# Patient Record
Sex: Male | Born: 1955
Health system: Southern US, Community
[De-identification: ages and names within clinical notes are randomized; demographics above are authoritative.]

## PROBLEM LIST (undated history)

## (undated) DIAGNOSIS — N183 Chronic kidney disease, stage 3 unspecified: Secondary | ICD-10-CM

## (undated) DIAGNOSIS — I10 Essential (primary) hypertension: Secondary | ICD-10-CM

## (undated) DIAGNOSIS — M869 Osteomyelitis, unspecified: Secondary | ICD-10-CM

## (undated) DIAGNOSIS — K759 Inflammatory liver disease, unspecified: Secondary | ICD-10-CM

## (undated) DIAGNOSIS — J45909 Unspecified asthma, uncomplicated: Secondary | ICD-10-CM

## (undated) DIAGNOSIS — C801 Malignant (primary) neoplasm, unspecified: Secondary | ICD-10-CM

## (undated) HISTORY — DX: Unspecified asthma, uncomplicated: J45.909

## (undated) HISTORY — DX: Malignant (primary) neoplasm, unspecified: C80.1

## (undated) HISTORY — PX: WISDOM TOOTH EXTRACTION: SHX21

## (undated) HISTORY — PX: COLONOSCOPY: SHX174

---

## 2011-10-23 ENCOUNTER — Other Ambulatory Visit: Payer: Self-pay | Admitting: Family Medicine

## 2011-10-23 DIAGNOSIS — I6529 Occlusion and stenosis of unspecified carotid artery: Secondary | ICD-10-CM

## 2011-10-28 ENCOUNTER — Other Ambulatory Visit: Payer: Self-pay

## 2011-10-29 ENCOUNTER — Ambulatory Visit
Admission: RE | Admit: 2011-10-29 | Discharge: 2011-10-29 | Disposition: A | Discharge status: Home / Self Care | Payer: 59 | Source: Ambulatory Visit | Attending: Family Medicine | Admitting: Family Medicine

## 2011-10-29 DIAGNOSIS — I6529 Occlusion and stenosis of unspecified carotid artery: Secondary | ICD-10-CM

## 2011-10-29 IMAGING — US US CAROTID DUPLEX BILAT
1 series · 13 of 24 positions shown · non-contrast
Comparison: None.

CLINICAL DATA: History of lightheadedness and dizziness.  Evaluate
for carotid artery stenosis.

BILATERAL CAROTID DUPLEX ULTRASOUND
TECHNIQUE: Gray scale imaging, color Doppler and duplex ultrasound
was performed of bilateral carotid and vertebral arteries in the
neck.

[Series 1: us carotid duplex bilat · 0.08mm/px · 13 of 49 slices shown]
[im 1/49]
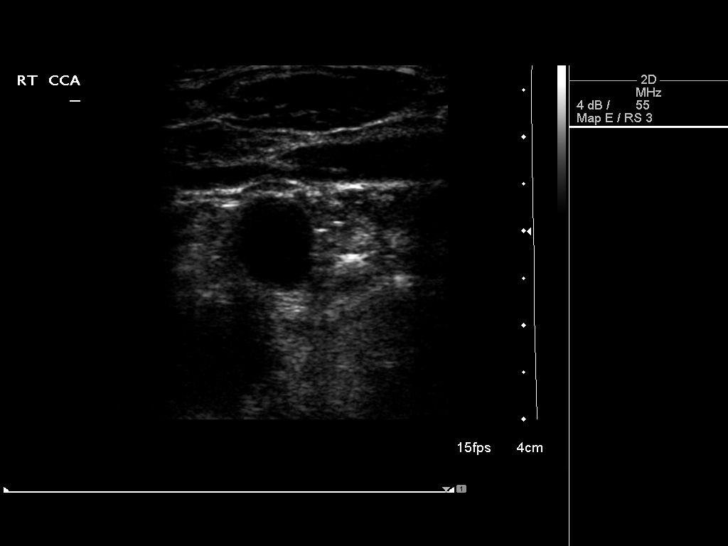
[im 5/49]
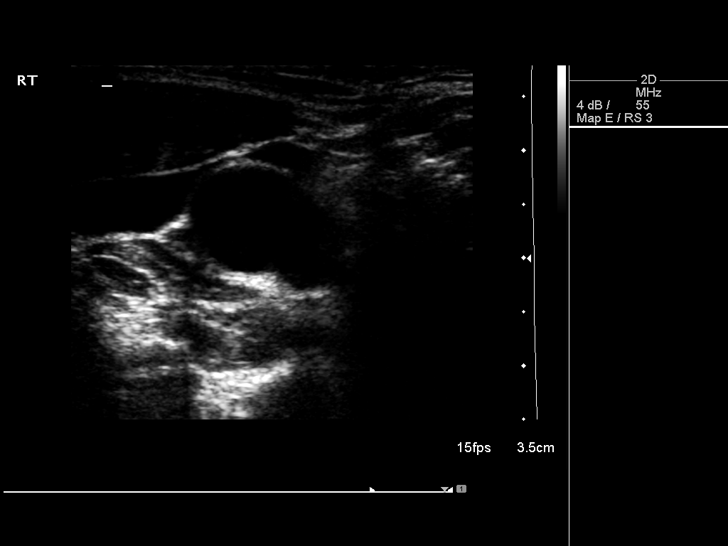
[im 9/49]
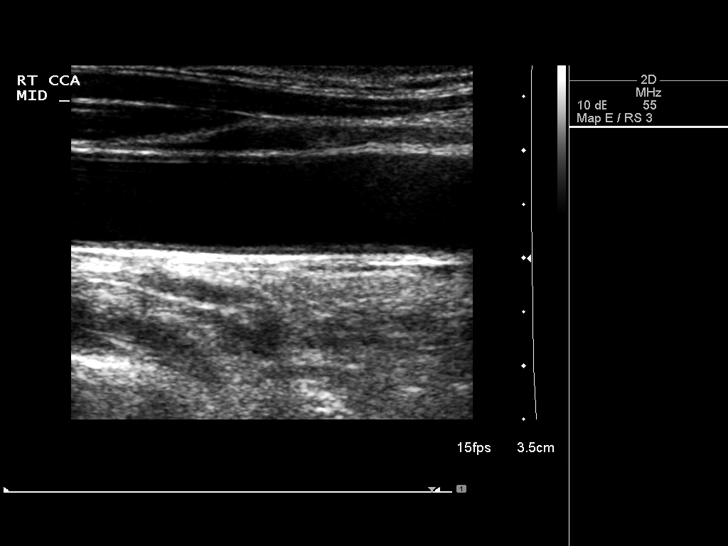
[im 13/49]
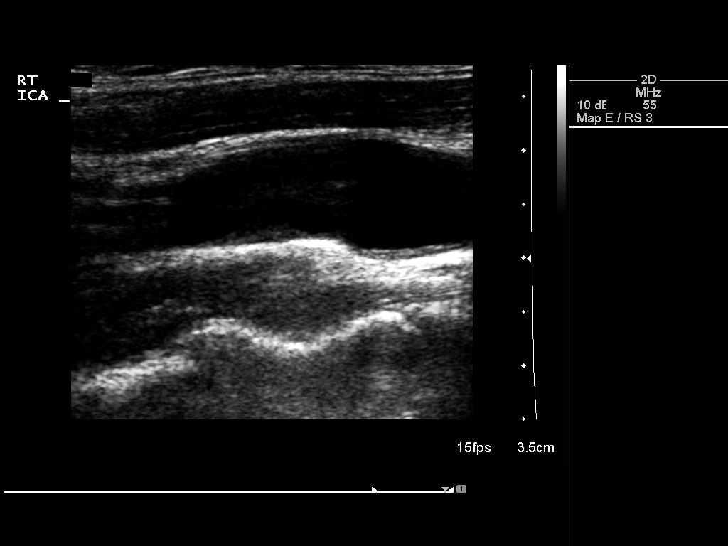
[im 17/49]
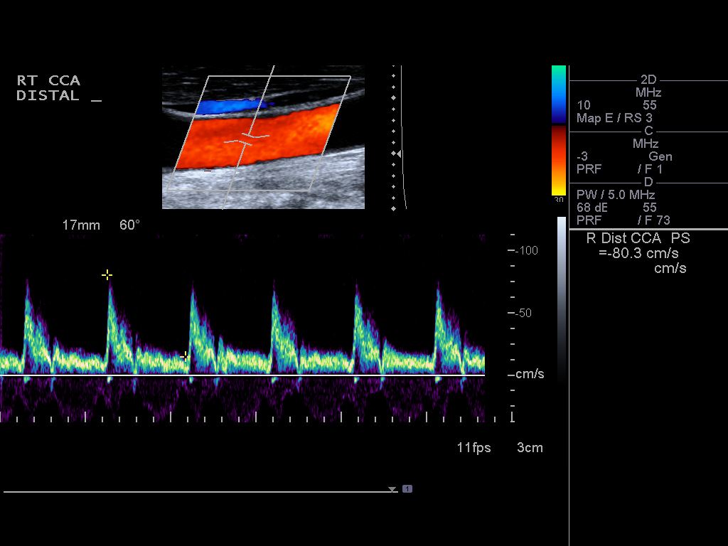
[im 21/49]
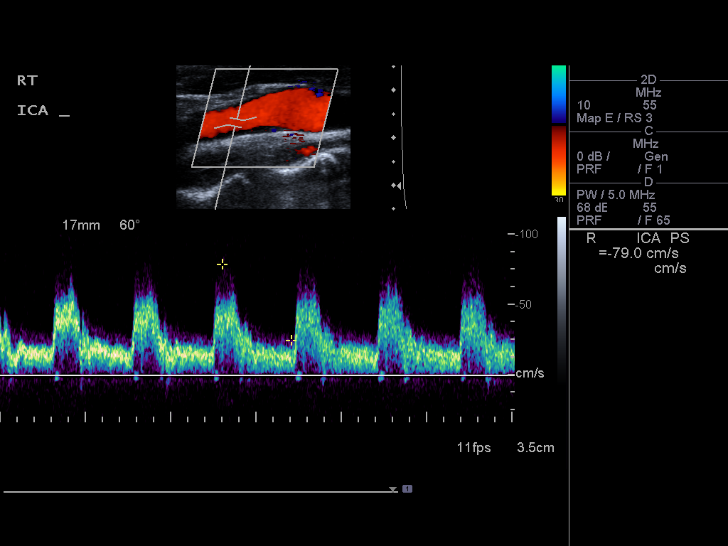
[im 26/49]
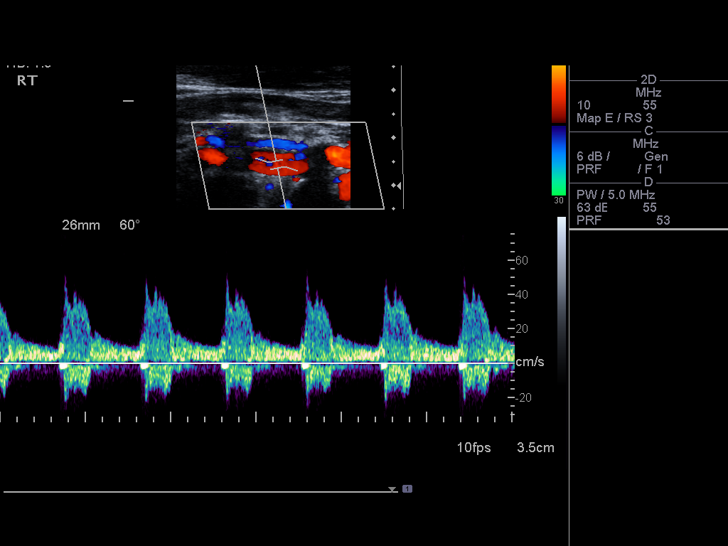
[im 28/49]
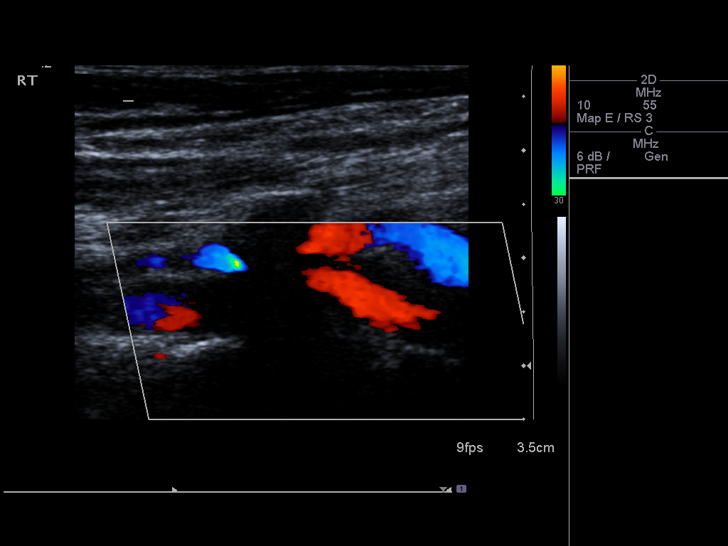
[im 32/49]
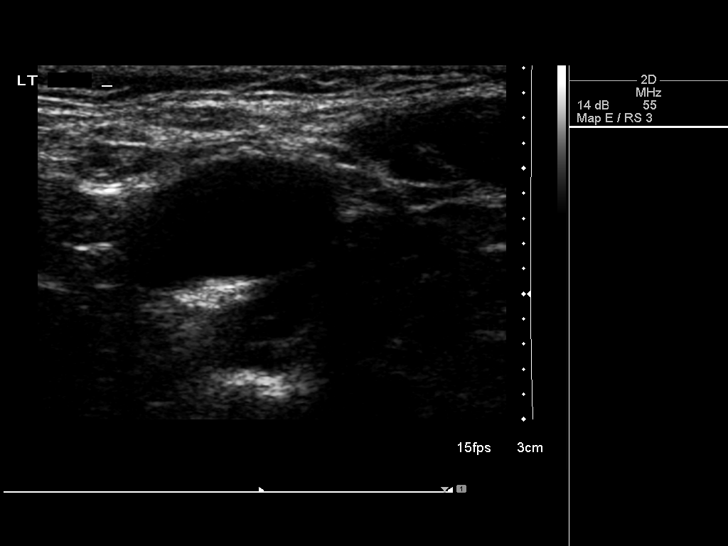
[im 36/49]
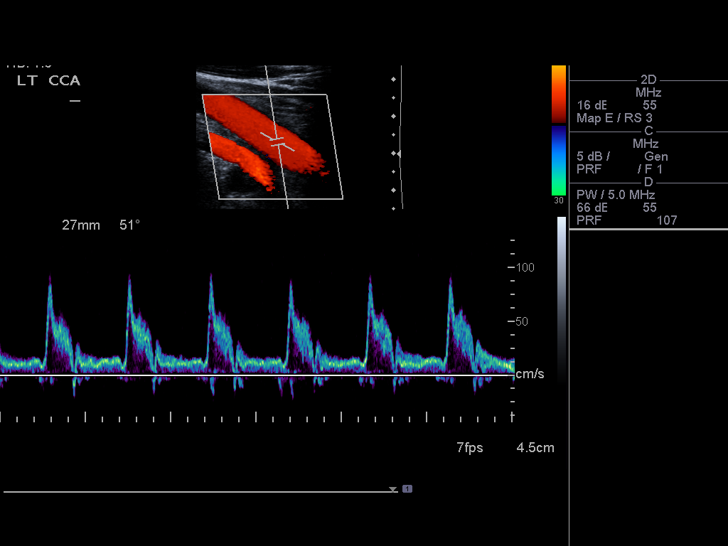
[im 40/49]
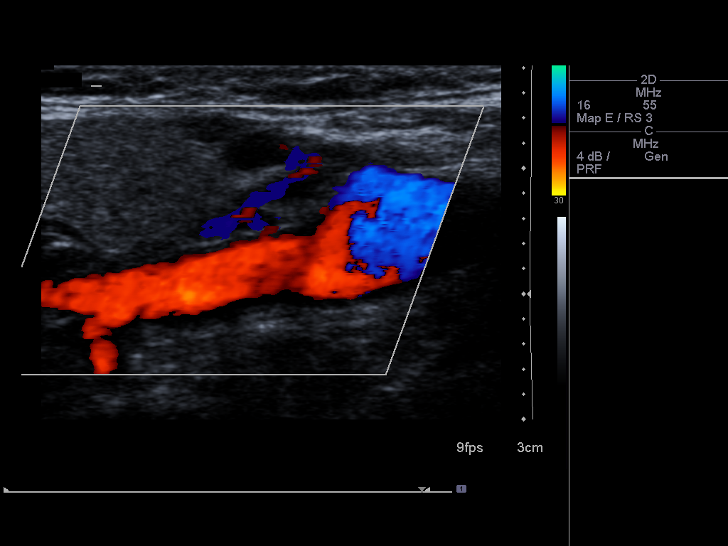
[im 44/49]
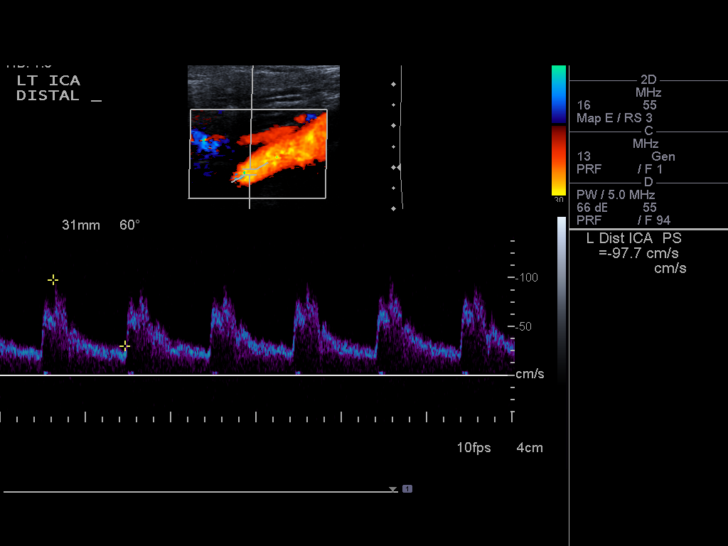
[im 49/49]
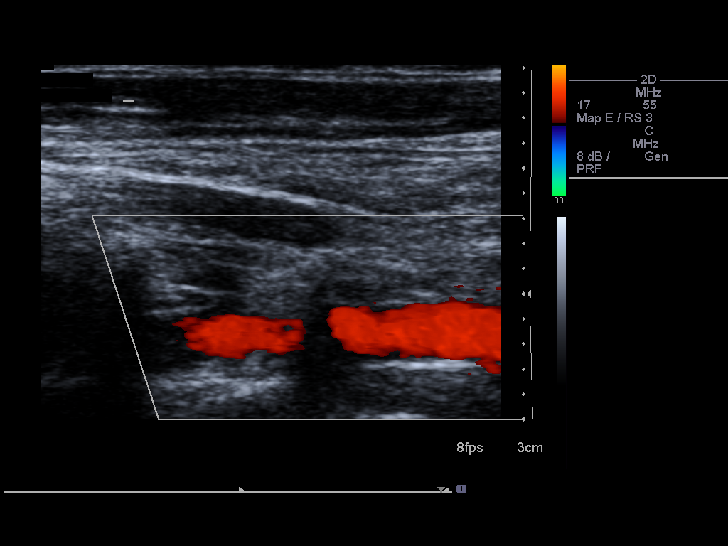

[13 of 24 positions shown; findings below may reference images not displayed]

Criteria:  Quantification of carotid stenosis is based on velocity
parameters that correlate the residual internal carotid diameter
with NASCET-based stenosis levels, using the diameter of the distal
internal carotid lumen as the denominator for stenosis measurement.

The following velocity measurements were obtained:

                 PEAK SYSTOLIC/END DIASTOLIC
RIGHT
ICA:                        110cm/sec
CCA:                        89cm/sec
SYSTOLIC ICA/CCA RATIO:
DIASTOLIC ICA/CCA RATIO:
ECA:                        86cm/sec

LEFT
ICA:                        112cm/sec
CCA:                        96cm/sec
SYSTOLIC ICA/CCA RATIO:
DIASTOLIC ICA/CCA RATIO:
ECA:                        97cm/sec
FINDINGS: RIGHT CAROTID ARTERY: There is a small amount of plaque in the
proximal right internal carotid artery.  Normal waveforms and
velocities throughout the right carotid arteries.  No evidence for
carotid artery stenosis.

RIGHT VERTEBRAL ARTERY:  Antegrade flow and normal waveform in the
right vertebral artery.

LEFT CAROTID ARTERY: Antegrade flow in the left carotid arteries
without significant plaque or stenosis.

LEFT VERTEBRAL ARTERY:  Antegrade flow and normal waveform in the
left vertebral artery.
IMPRESSION: Small amount of plaque in the right internal carotid artery.  No
evidence for hemodynamically significant stenosis.  Estimated
degree of stenosis in the internal carotid arteries is less than
50% bilaterally.

## 2012-02-24 ENCOUNTER — Ambulatory Visit (INDEPENDENT_AMBULATORY_CARE_PROVIDER_SITE_OTHER): Payer: 59 | Admitting: Family Medicine

## 2012-02-24 VITALS — BP 163/70 | HR 75 | Temp 98.2°F | Resp 16 | Ht 69.75 in | Wt 183.0 lb

## 2012-02-24 DIAGNOSIS — R1084 Generalized abdominal pain: Secondary | ICD-10-CM

## 2012-02-24 DIAGNOSIS — I10 Essential (primary) hypertension: Secondary | ICD-10-CM

## 2012-02-24 DIAGNOSIS — E119 Type 2 diabetes mellitus without complications: Secondary | ICD-10-CM

## 2012-02-24 DIAGNOSIS — H53129 Transient visual loss, unspecified eye: Secondary | ICD-10-CM

## 2012-02-24 DIAGNOSIS — R609 Edema, unspecified: Secondary | ICD-10-CM

## 2012-02-24 DIAGNOSIS — J45909 Unspecified asthma, uncomplicated: Secondary | ICD-10-CM | POA: Insufficient documentation

## 2012-02-24 LAB — POCT GLYCOSYLATED HEMOGLOBIN (HGB A1C): Hemoglobin A1C: 5.4

## 2012-02-24 MED ORDER — LOSARTAN POTASSIUM-HCTZ 50-12.5 MG PO TABS: 1.0000 | ORAL_TABLET | Freq: Every day | ORAL | Status: DC

## 2012-02-24 MED ORDER — METOPROLOL SUCCINATE ER 50 MG PO TB24: 50.0000 mg | ORAL_TABLET | Freq: Every day | ORAL | Status: DC

## 2012-02-24 MED ORDER — AUTO-LANCETS MISC: 100.0000 [IU] | Freq: Every morning | Status: DC

## 2012-02-24 MED ORDER — GLIPIZIDE-METFORMIN HCL 2.5-500 MG PO TABS: 1.0000 | ORAL_TABLET | Freq: Two times a day (BID) | ORAL | Status: DC

## 2012-02-24 NOTE — Progress Notes (Signed)
    Dr. 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 He's been noticing decreasing vision has been going to Dr. Herbert Deaner. He doesn't have a lot of confidence in his eye doctors have a mentor for Dr. Zadie Rhine before. He describes vision deterioration as loss of acuity. It involves both eyes. He's had past laser surgery. Her graft one month ago his father died of small bowel cancer having had 2 weeks of vomiting and then sepsis. He's concerned that he may have small bowel cancer as well although he said a colonoscopy which was negative. Family is concerned about swelling in his feet and rash which she describes as vesicular polypi waxy. Objective:  Objective: Alert no acute distress  HEENT unremarkable  Chest clear  Heart regular no murmur  Fundi: Post laser treatments no hemorrhages  Extremities: Good pulses 1-2+ edema, waxy surfaces on the  dorsal surface, a few abrasions on the anterior tibial surfaces.  Assessment: Diabetes control to be established  Visual problems need to call Dr. Herbert Deaner  Edema-we'll need to add diuretic for better blood pressure control as well as diuresis and edema control  Family history of bowel cancer-will need abdominal CT

## 2012-02-25 ENCOUNTER — Other Ambulatory Visit: Payer: Self-pay | Admitting: Family Medicine

## 2012-02-25 DIAGNOSIS — R7989 Other specified abnormal findings of blood chemistry: Secondary | ICD-10-CM

## 2012-02-25 LAB — COMPREHENSIVE METABOLIC PANEL
ALT: 17 U/L (ref 0–53)
AST: 20 U/L (ref 0–37)
Albumin: 4.1 g/dL (ref 3.5–5.2)
Alkaline Phosphatase: 74 U/L (ref 39–117)
BUN: 25 mg/dL — ABNORMAL HIGH (ref 6–23)
CO2: 25 mEq/L (ref 19–32)
Calcium: 9.2 mg/dL (ref 8.4–10.5)
Chloride: 108 mEq/L (ref 96–112)
Creat: 2.15 mg/dL — ABNORMAL HIGH (ref 0.50–1.35)
Glucose, Bld: 115 mg/dL — ABNORMAL HIGH (ref 70–99)
Potassium: 4.8 mEq/L (ref 3.5–5.3)
Sodium: 140 mEq/L (ref 135–145)
Total Bilirubin: 0.3 mg/dL (ref 0.3–1.2)
Total Protein: 6.9 g/dL (ref 6.0–8.3)

## 2012-02-25 LAB — TSH: TSH: 2.619 u[IU]/mL (ref 0.350–4.500)

## 2012-03-03 ENCOUNTER — Other Ambulatory Visit: Payer: Self-pay | Admitting: Family Medicine

## 2012-03-03 ENCOUNTER — Ambulatory Visit
Admission: RE | Admit: 2012-03-03 | Discharge: 2012-03-03 | Disposition: A | Discharge status: Home / Self Care | Payer: 59 | Source: Ambulatory Visit | Attending: Family Medicine | Admitting: Family Medicine

## 2012-03-03 IMAGING — CT CT ABD-PELV W/O CM
2 of 4 series · 17 of 46 positions shown, 19 images · IV contrast (READICAT/WATER &)
Comparison: None.

CLINICAL DATA: Abdominal pain, constipation.

CT ABDOMEN AND PELVIS WITHOUT CONTRAST
TECHNIQUE: Multidetector CT imaging of the abdomen and pelvis was
performed following the standard protocol without intravenous
contrast.

[Series 2: abd/pelvis without · axial · non-contrast · 0.70mm/px · z∈[-390,-5]mm · 14 of 85 slices shown, 16 images]
[im 4/85  soft-tissue]
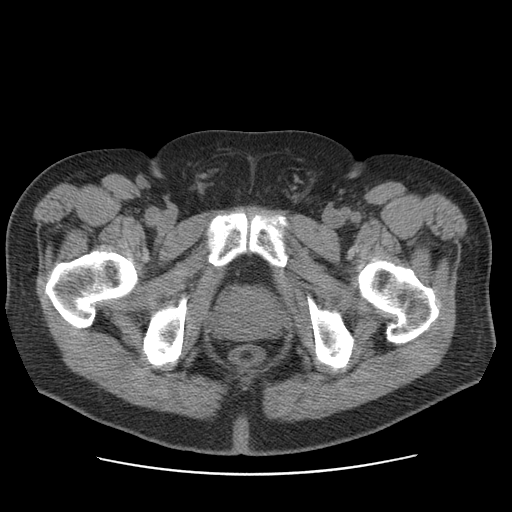
[im 4/85  bone]
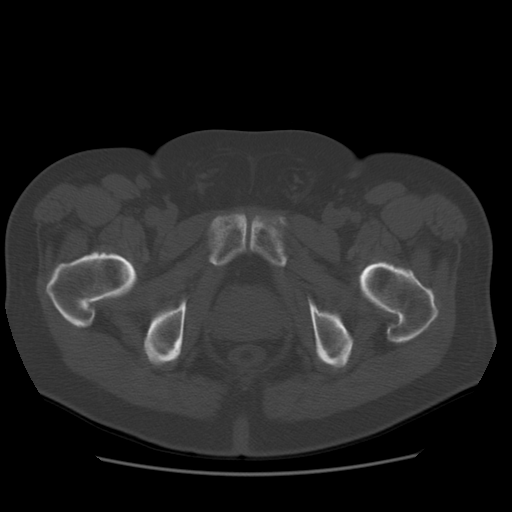
[im 11/85  soft-tissue]
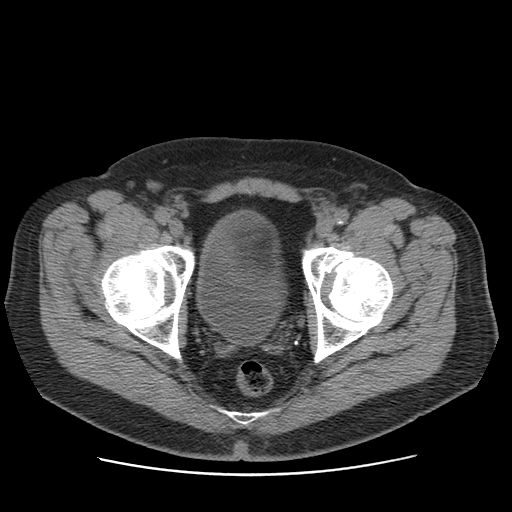
[im 17/85  soft-tissue]
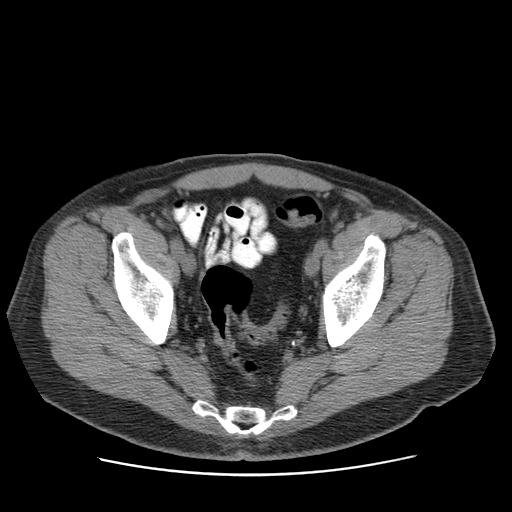
[im 24/85  soft-tissue]
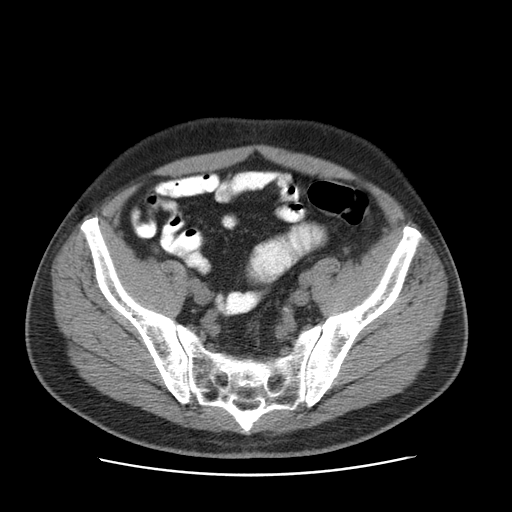
[im 27/85  soft-tissue]
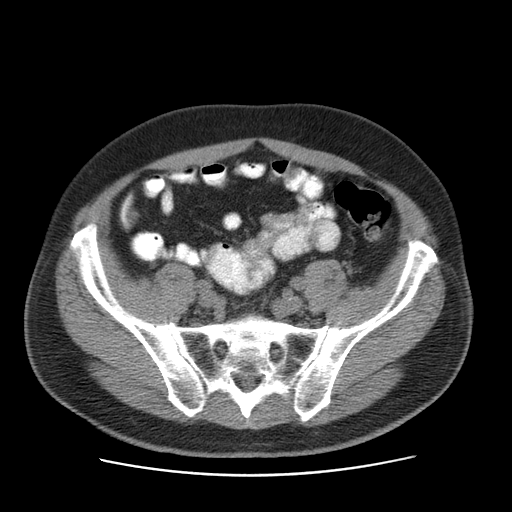
[im 34/85  soft-tissue]
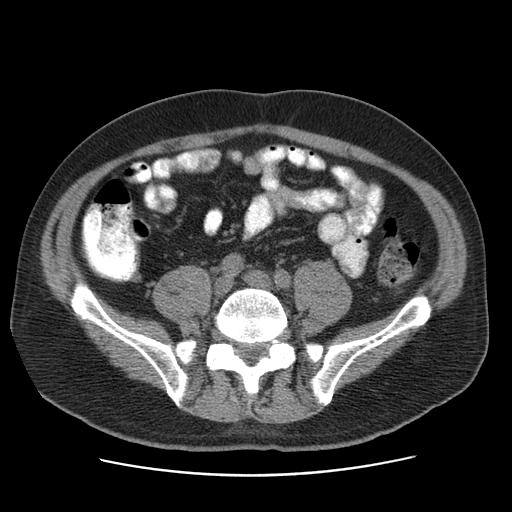
[im 41/85  soft-tissue]
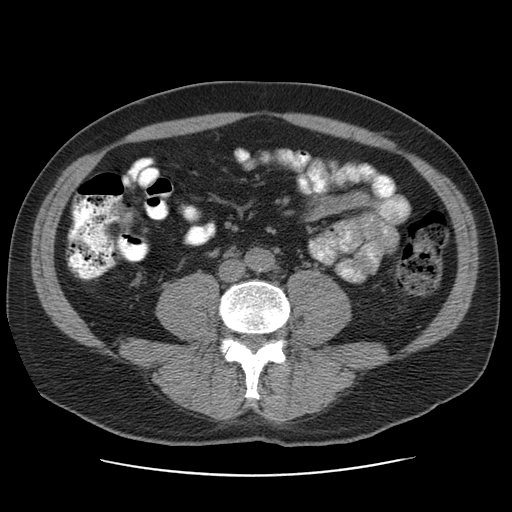
[im 44/85  soft-tissue]
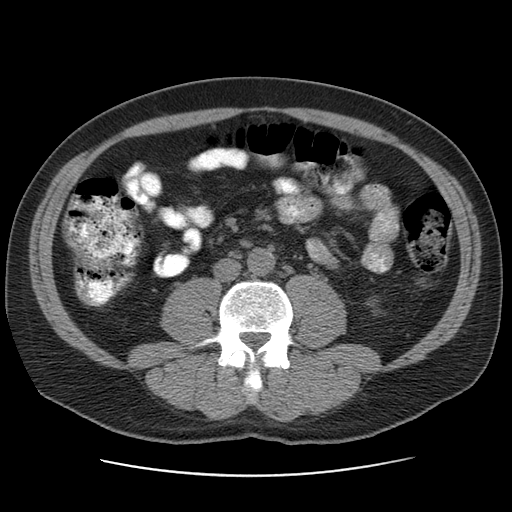
[im 51/85  soft-tissue]
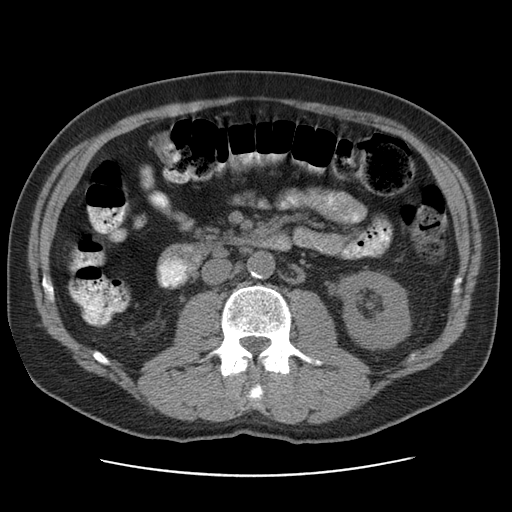
[im 51/85  bone]
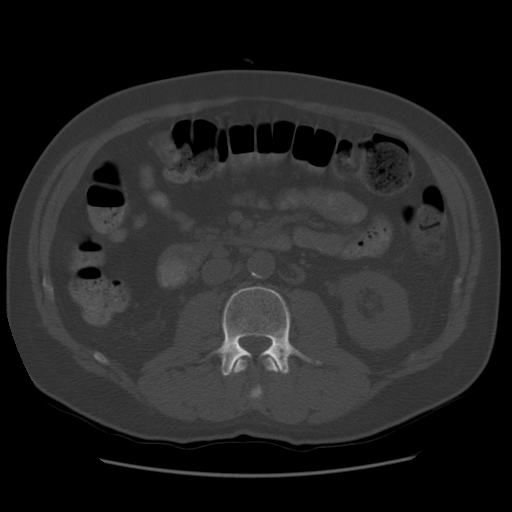
[im 58/85  soft-tissue]
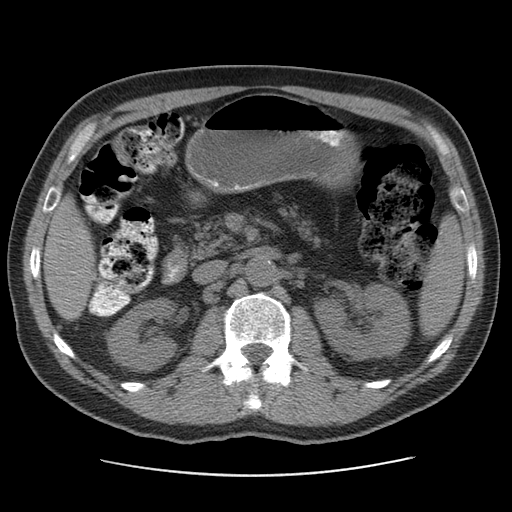
[im 64/85  soft-tissue]
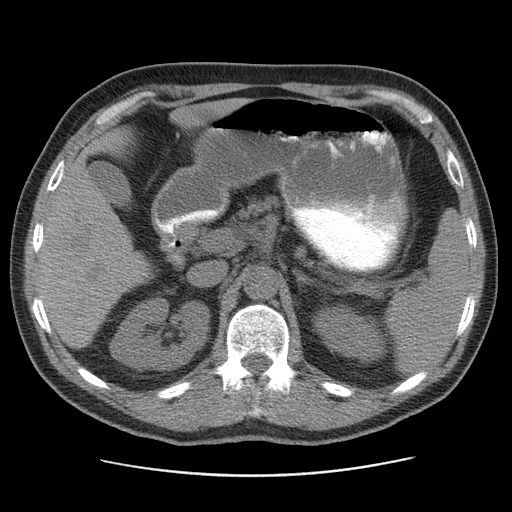
[im 68/85  soft-tissue]
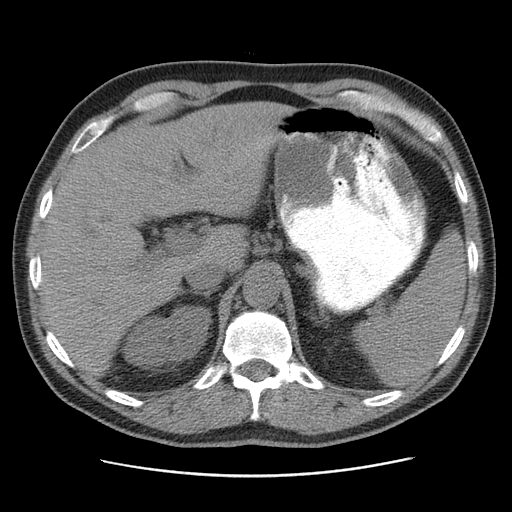
[im 74/85  soft-tissue]
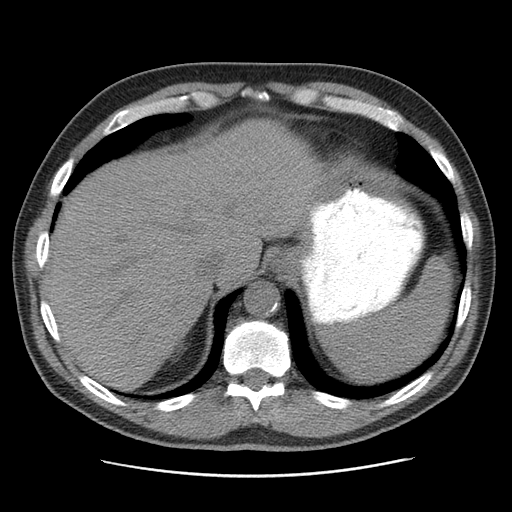
[im 81/85  soft-tissue]
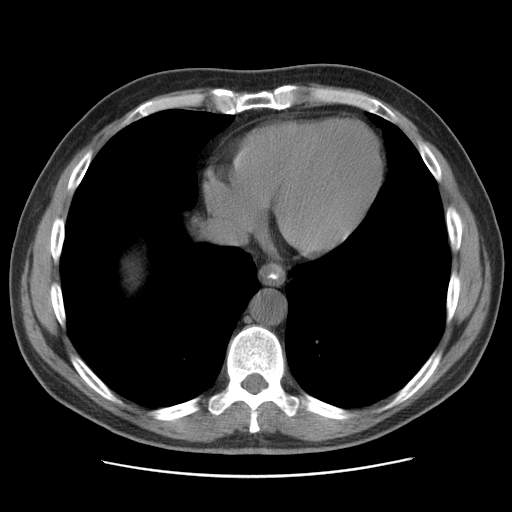

[Series 400: cor · coronal · 0.92mm/px · 3 of 120 slices shown]
[im 40/120  soft-tissue]
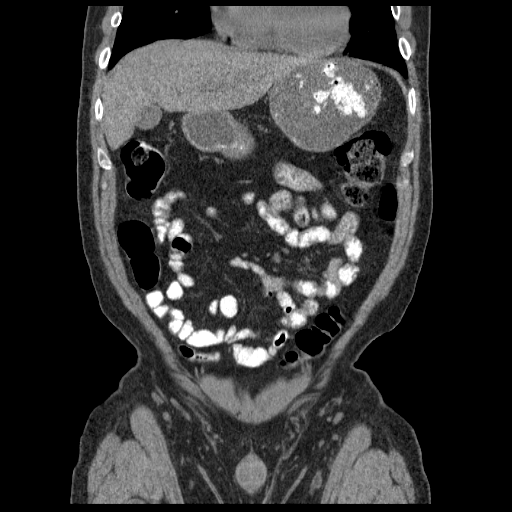
[im 53/120  soft-tissue]
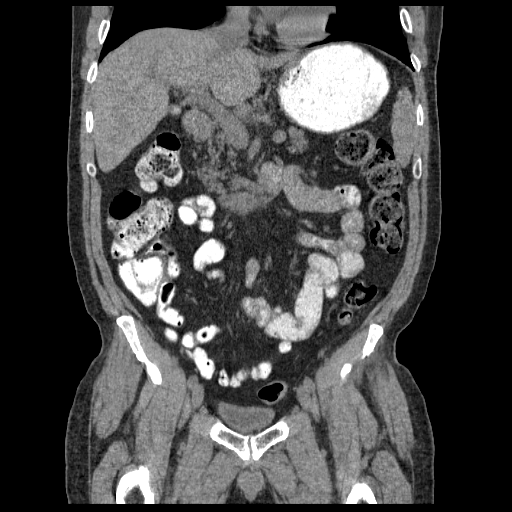
[im 67/120  soft-tissue]
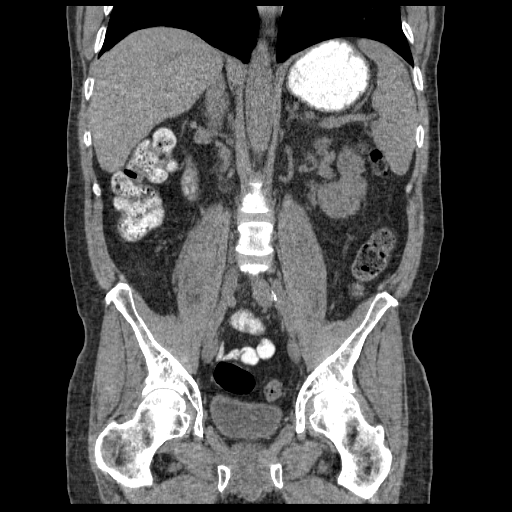

[17 of 46 positions shown; findings below may reference images not displayed]

FINDINGS: There is circumferential wall thickening within the
distal esophagus, question esophagitis.  Lung bases are clear.  No
effusions.  Heart is normal size.

Gallstones are noted within the gallbladder.  Liver, spleen,
pancreas, adrenals have an unremarkable unenhanced appearance.
Right kidney unremarkable.  Small low-density lesion in the mid
pole of the left kidney cannot be characterized without IV
contrast, measuring 16 mm.  This most likely represents a small
cyst.

Moderate stool burden throughout the colon.  Small bowel is
decompressed.  Appendix is visualized and is normal.

Shotty retroperitoneal lymph nodes, none pathologically enlarged.
No free fluid or free air.  Urinary bladder is unremarkable.

No acute bony abnormality.
IMPRESSION: No acute findings in the abdomen or pelvis.

Moderate stool burden throughout the colon.

## 2012-03-04 ENCOUNTER — Telehealth: Payer: Self-pay | Admitting: Radiology

## 2012-03-04 NOTE — Telephone Encounter (Signed)
Informed pt of normal ct scan.

## 2012-03-04 NOTE — Telephone Encounter (Signed)
Message copied by Ritta Slot on Wed Mar 04, 2012  3:26 PM ------      Message from: Kem Boroughs D      Created: Wed Mar 04, 2012  2:12 PM                   ----- Message -----         From: Robyn Haber, MD         Sent: 03/04/2012   1:17 PM           To: [facility]            Please inform patient of normal result.  Asymptomatic gallstones are present but there is no sign of any cancer!

## 2012-03-11 ENCOUNTER — Ambulatory Visit: Payer: 59

## 2012-03-11 ENCOUNTER — Ambulatory Visit (INDEPENDENT_AMBULATORY_CARE_PROVIDER_SITE_OTHER): Payer: 59 | Admitting: Family Medicine

## 2012-03-11 VITALS — BP 88/50 | HR 111 | Temp 99.4°F | Resp 16 | Ht 69.75 in | Wt 172.8 lb

## 2012-03-11 DIAGNOSIS — R059 Cough, unspecified: Secondary | ICD-10-CM

## 2012-03-11 DIAGNOSIS — R000 Tachycardia, unspecified: Secondary | ICD-10-CM

## 2012-03-11 DIAGNOSIS — R05 Cough: Secondary | ICD-10-CM

## 2012-03-11 DIAGNOSIS — R509 Fever, unspecified: Secondary | ICD-10-CM

## 2012-03-11 LAB — POCT CBC
Granulocyte percent: 58.4 %G (ref 37–80)
Hemoglobin: 10.2 g/dL — AB (ref 14.1–18.1)
MCH, POC: 28.3 pg (ref 27–31.2)
MCV: 88.4 fL (ref 80–97)
MPV: 7.6 fL (ref 0–99.8)
POC MID %: 13.6 %M — AB (ref 0–12)
Platelet Count, POC: 150 10*3/uL (ref 142–424)
RBC: 3.6 M/uL — AB (ref 4.69–6.13)
WBC: 4.7 10*3/uL (ref 4.6–10.2)

## 2012-03-11 LAB — POCT INFLUENZA A/B
Influenza A, POC: NEGATIVE
Influenza B, POC: NEGATIVE

## 2012-03-11 IMAGING — CR DG CHEST 2V
2 series · 2 of 2 positions shown · non-contrast
Comparison: None.

CLINICAL DATA: Cough, fever, sinus drainage.

CHEST - 2 VIEW

[PA]
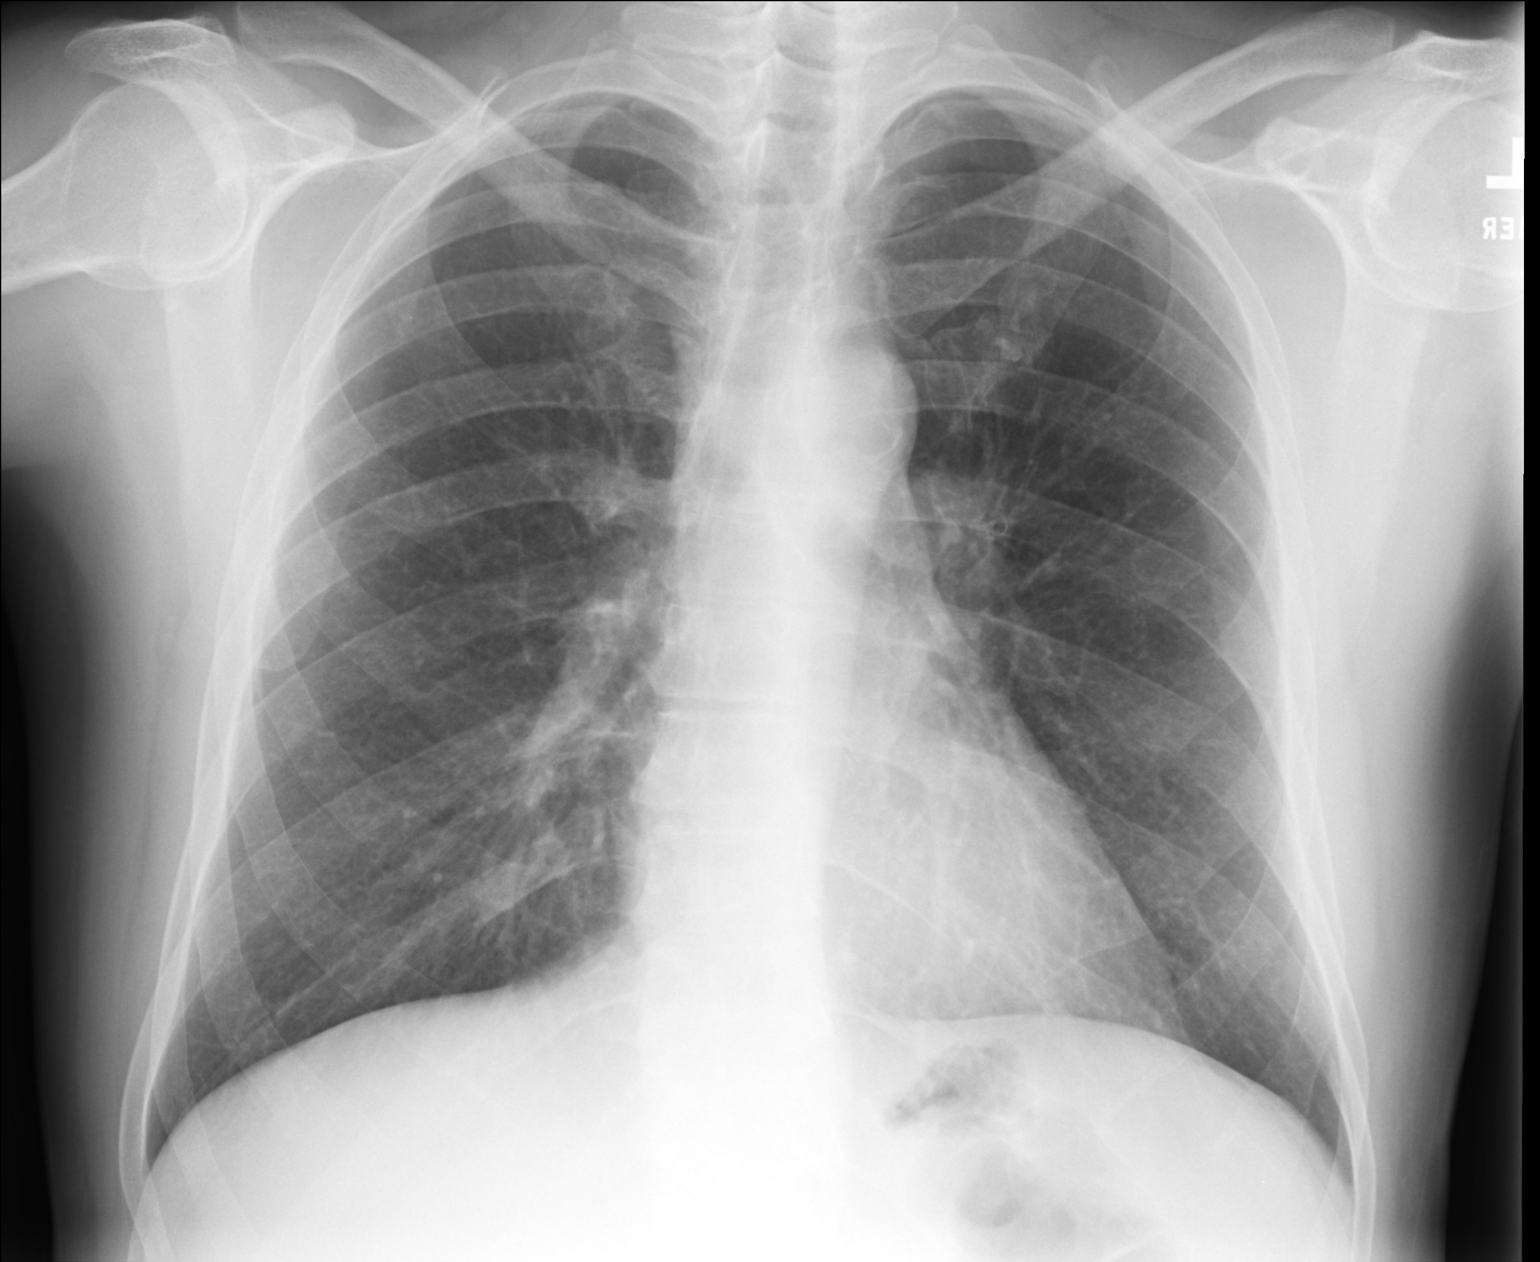

[lateral]
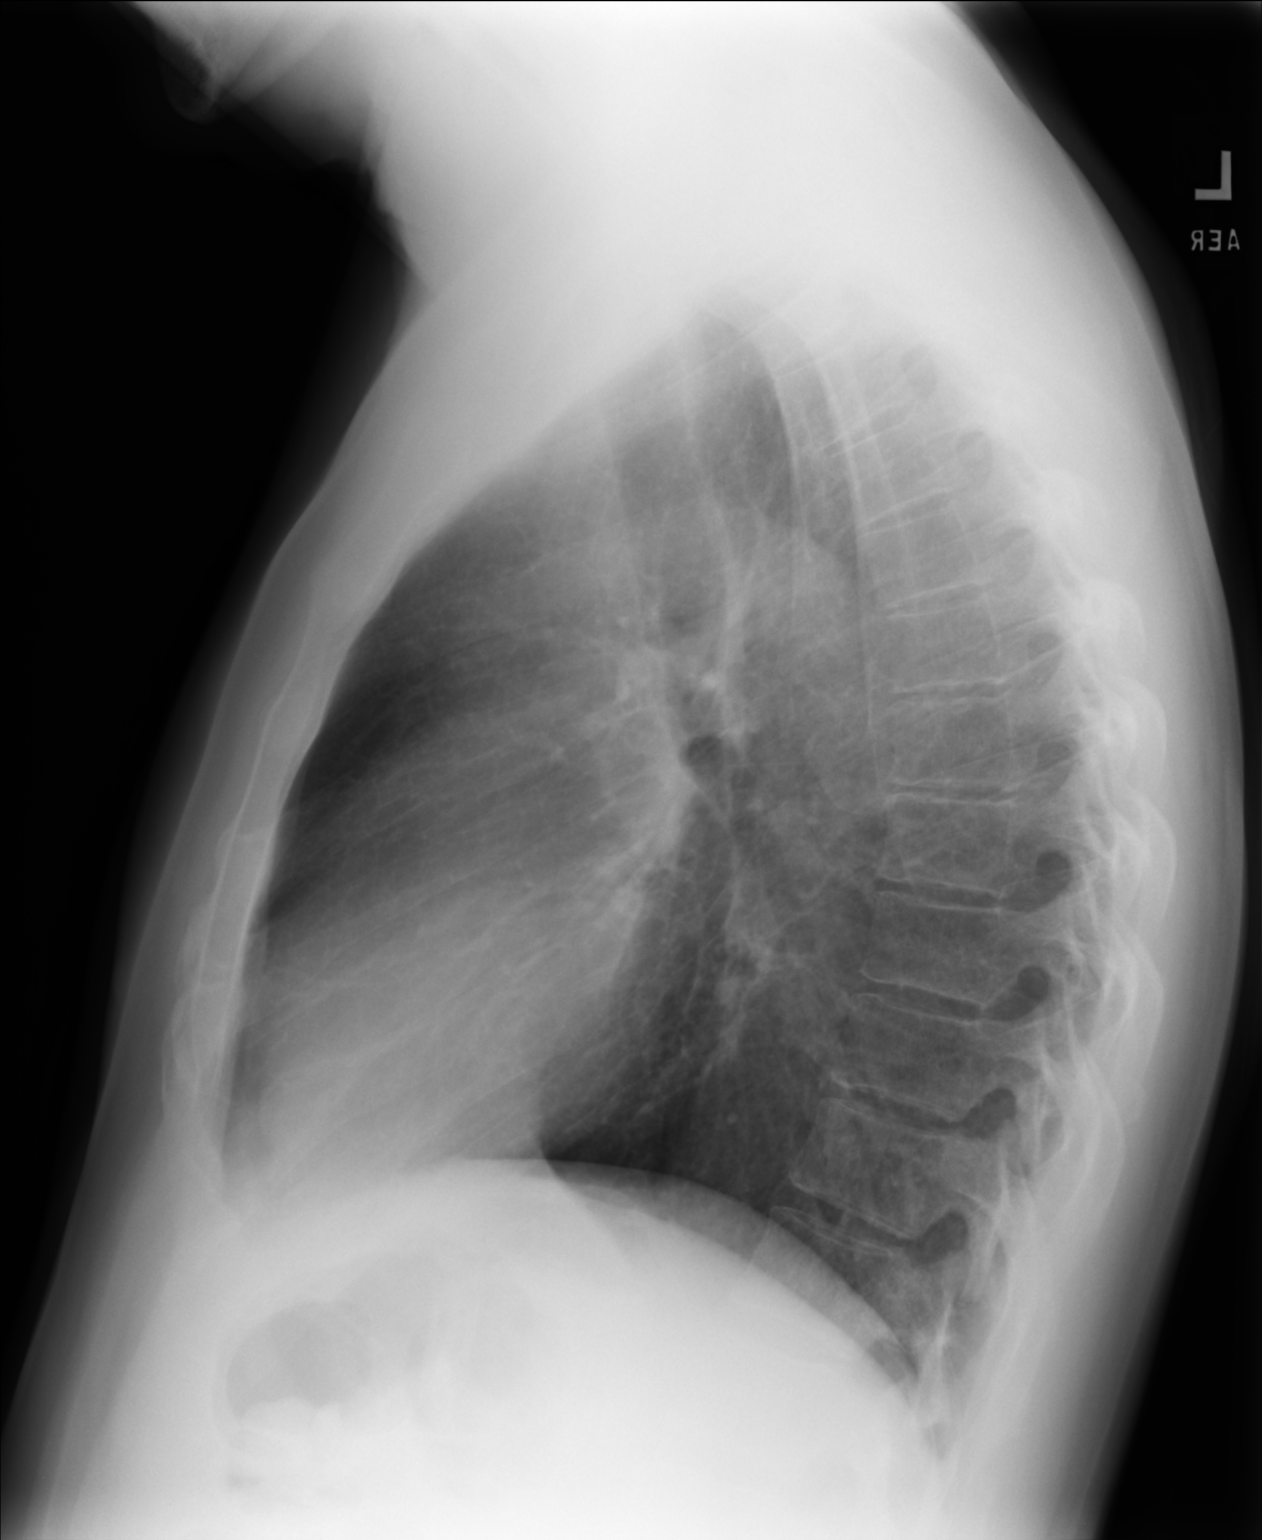

[2 of 2 positions shown; findings below may reference images not displayed]

FINDINGS: Heart and mediastinal contours are within normal limits.
No focal opacities or effusions.  No acute bony abnormality.
IMPRESSION: No active cardiopulmonary disease.

## 2012-03-11 MED ORDER — HYDROCODONE-HOMATROPINE 5-1.5 MG/5ML PO SYRP: 5.0000 mL | ORAL_SOLUTION | Freq: Three times a day (TID) | ORAL | Status: AC | PRN

## 2012-03-11 MED ORDER — AZITHROMYCIN 250 MG PO TABS: ORAL_TABLET | ORAL | Status: AC

## 2012-03-11 NOTE — Progress Notes (Signed)
Patient Name: Duane Hall Date of Birth: 12-18-1956 Medical Record Number: [id number] Gender: male Date of Encounter: 03/11/2012  History of Present Illness:  Duane Hall is a 56 y.o. very pleasant male patient who presents with the following:  Last week he noted a severe cough, ST, occasionally coughing up phlegm. Had some blood from his nose this morning- not a nose bleed but bloody mucus. " I have sinus problems year round."  Feels fatigued, achy, low grade fevers.  Cough is painful when it is severe.  No sick contacts.   Denies any melena or BRBPR, no epistaxis or other bleeding or bruising.  He has a history of DM with good control, and HTN- BP is usually a little bit elevated.   He has an apparently long history of anemia- so far work-up has been unfruitful.  He did have a colonoscopy about 6 months ago with several polyps sampled- however all were benign.  He states that his father also had anemia without any known cause.   Patient Active Problem List  Diagnoses  . Type II diabetes mellitus  . Hypertension  . Asthma   Past Medical History  Diagnosis Date  . Cataract   . Glaucoma   . Diabetes mellitus    No past surgical history on file. History  Substance Use Topics  . Smoking status: Current Everyday Smoker -- 0.8 packs/day    Types: Cigarettes  . Smokeless tobacco: Not on file  . Alcohol Use: Not on file   Family History  Problem Relation Age of Onset  . Cancer Father    No Known Allergies  Medication list has been reviewed and updated.  Review of Systems: As per HPI- otherwise negative.   Physical Examination: Filed Vitals:   03/11/12 0917  BP: 88/50  Pulse: 111  Temp: 99.4 F (37.4 C)  TempSrc: Oral  Resp: 16  Height: 5' 9.75" (1.772 m)  Weight: 172 lb 12.8 oz (78.382 kg)  Bp recheck 108/ 88 Recheck again 115/70, pulse 100  Body mass index is 24.97 kg/(m^2).  GEN: WDWN, NAD, Non-toxic, A & O x 3, very pale HEENT: Atraumatic,  Normocephalic. Neck supple. No masses, No LAD. TM and oropharynx clear Ears and Nose: No external deformity. CV: tachycardic, No M/G/R. No JVD. No thrill. No extra heart sounds. PULM: CTA B, no wheezes, crackles, rhonchi. No retractions. No resp. distress. No accessory muscle use. ABD: S, NT, ND, +BS. No rebound. No HSM. EXTR: No c/c/e NEURO Normal gait.  PSYCH: Normally interactive. Conversant. Not depressed or anxious appearing.  Calm demeanor.   Office Visit on 02/24/2012  Component Date Value Range Status  . Sodium (mEq/L) 02/24/2012 140  135-145 Final  . Potassium (mEq/L) 02/24/2012 4.8  3.5-5.3 Final  . Chloride (mEq/L) 02/24/2012 108  96-112 Final  . CO2 (mEq/L) 02/24/2012 25  19-32 Final  . Glucose, Bld (mg/dL) 02/24/2012 115* 70-99 Final  . BUN (mg/dL) 02/24/2012 25* 6-23 Final  . Creat (mg/dL) 02/24/2012 2.15* 0.50-1.35 Final  . Total Bilirubin (mg/dL) 02/24/2012 0.3  0.3-1.2 Final  . Alkaline Phosphatase (U/L) 02/24/2012 74  39-117 Final  . AST (U/L) 02/24/2012 20  0-37 Final  . ALT (U/L) 02/24/2012 17  0-53 Final  . Total Protein (g/dL) 02/24/2012 6.9  6.0-8.3 Final  . Albumin (g/dL) 02/24/2012 4.1  3.5-5.2 Final  . Calcium (mg/dL) 02/24/2012 9.2  8.4-10.5 Final  . Hemoglobin A1C  02/24/2012 5.4   Final  . TSH (uIU/mL) 02/24/2012 2.619  0.350-4.500 Final  A1c 10/2011 = 6.5%  Results for orders placed in visit on 03/11/12  POCT CBC      Component Value Range   WBC 4.7  4.6 - 10.2 (K/uL)   Lymph, poc 1.3  0.6 - 3.4    POC LYMPH PERCENT 28.0  10 - 50 (%L)   MID (cbc) 0.6  0 - 0.9    POC MID % 13.6 (*) 0 - 12 (%M)   POC Granulocyte 2.7  2 - 6.9    Granulocyte percent 58.4  37 - 80 (%G)   RBC 3.60 (*) 4.69 - 6.13 (M/uL)   Hemoglobin 10.2 (*) 14.1 - 18.1 (g/dL)   HCT, POC 31.8 (*) 43.5 - 53.7 (%)   MCV 88.4  80 - 97 (fL)   MCH, POC 28.3  27 - 31.2 (pg)   MCHC 32.1  31.8 - 35.4 (g/dL)   RDW, POC 13.6     Platelet Count, POC 150  142 - 424 (K/uL)   MPV 7.6  0 -  99.8 (fL)  GLUCOSE, POCT (MANUAL RESULT ENTRY)      Component Value Range   POC Glucose 132    POCT INFLUENZA A/B      Component Value Range   Influenza A, POC Negative     Influenza B, POC Negative      [facility] reading (PRIMARY) by  Dr. Lorelei Pont.  Negative chest  Clinical Data: Cough, fever, sinus drainage.  CHEST - 2 VIEW  Comparison: None.  Findings: Heart and mediastinal contours are within normal limits. No focal opacities or effusions. No acute bony abnormality.  IMPRESSION: No active cardiopulmonary disease.  Assessment and Plan: 1. Tachycardia  POCT CBC, POCT glucose (manual entry)  2. Cough  POCT Influenza A/B, DG Chest 2 View, azithromycin (ZITHROMAX) 250 MG tablet, HYDROcodone-homatropine (HYCODAN) 5-1.5 MG/5ML syrup  3. Fever  POCT Influenza A/B, DG Chest 2 View    [name] has a significant anemia today, but looking back in his paper chart his hemoglobin has been 10 for the last several months.  Will treat for bronchitis with azithromycin as above.  He agreed to come back for follow- up if he is not significantly better in 1-2 days- Sooner if worse.   I did encourage him to continue to follow his anemia, but he feels that this is probably just a familial trait.  He has already had a normal Hg electrophoreses, normal ferritin and negative colonoscopy.

## 2012-06-21 ENCOUNTER — Inpatient Hospital Stay: Admission: AD | Admit: 2012-06-21 | Payer: 59 | Source: Ambulatory Visit | Admitting: Cardiovascular Disease

## 2012-06-22 ENCOUNTER — Ambulatory Visit (HOSPITAL_COMMUNITY): Admission: RE | Admit: 2012-06-22 | Payer: 59 | Source: Ambulatory Visit | Admitting: Cardiovascular Disease

## 2012-06-22 ENCOUNTER — Encounter (HOSPITAL_COMMUNITY): Admission: RE | Payer: Self-pay | Source: Ambulatory Visit

## 2012-06-22 SURGERY — ANGIOGRAM, LOWER EXTREMITY
Anesthesia: LOCAL

## 2012-11-17 ENCOUNTER — Encounter: Payer: Self-pay | Admitting: Family Medicine

## 2012-11-17 ENCOUNTER — Ambulatory Visit (INDEPENDENT_AMBULATORY_CARE_PROVIDER_SITE_OTHER): Payer: Managed Care, Other (non HMO) | Admitting: Family Medicine

## 2012-11-17 VITALS — BP 118/70 | HR 79 | Temp 98.8°F | Resp 16 | Ht 69.0 in | Wt 174.0 lb

## 2012-11-17 DIAGNOSIS — I10 Essential (primary) hypertension: Secondary | ICD-10-CM

## 2012-11-17 DIAGNOSIS — I739 Peripheral vascular disease, unspecified: Secondary | ICD-10-CM

## 2012-11-17 DIAGNOSIS — E119 Type 2 diabetes mellitus without complications: Secondary | ICD-10-CM

## 2012-11-17 DIAGNOSIS — M25519 Pain in unspecified shoulder: Secondary | ICD-10-CM

## 2012-11-17 MED ORDER — GLIPIZIDE ER 5 MG PO TB24: 5.0000 mg | ORAL_TABLET | Freq: Every day | ORAL | Status: DC

## 2012-11-17 MED ORDER — LOSARTAN POTASSIUM 50 MG PO TABS: 50.0000 mg | ORAL_TABLET | Freq: Every day | ORAL | Status: DC

## 2012-11-17 MED ORDER — METOPROLOL SUCCINATE ER 50 MG PO TB24: 50.0000 mg | ORAL_TABLET | Freq: Every day | ORAL | Status: DC

## 2012-11-17 MED ORDER — HYDROCHLOROTHIAZIDE 12.5 MG PO CAPS: 12.5000 mg | ORAL_CAPSULE | Freq: Every day | ORAL | Status: DC

## 2012-11-17 MED ORDER — METFORMIN HCL 500 MG PO TABS: 500.0000 mg | ORAL_TABLET | Freq: Two times a day (BID) | ORAL | Status: DC

## 2012-11-17 NOTE — Progress Notes (Signed)
56 yo man with CAD who has been told that he needs a coronary angiogram, but if he has it, he may go into renal failure.  He had a nucleide study and echo by Dr. Gwenlyn Found at [facility].  Patient notes leg aches when walking and has mild DOE on similar movement.  He continues to smoke 1/2 pack per day.  The electronic cigarettes do not work for him and he doesn't want to try chantix.  Refuses immunizations saying he doesn't believe in them  Complains of axillary body odor and wonders what is causing this:  Worse when using heating blanket or exercising.  He also complains today of migratory joint aches, with pain in the right shoulder today.  This has limited his activities more than the above symptoms.  He also complains about the price of his medications  Objective:  NAD Skin:  Pickers nodules right dorsal hand Chest:  Clear Heart:  Regular, no murmur Ext:  No edema He has inability to fully abduct right shoulder, but there is no swelling or tenderness in the shoulder  Assessment: 1. Shoulder pain  Sedimentation rate, Hemoglobin A1c, Comprehensive metabolic panel  2. Claudication  Sedimentation rate, Hemoglobin A1c, Comprehensive metabolic panel  3. Diabetes  glipiZIDE (GLUCOTROL XL) 5 MG 24 hr tablet, metFORMIN (GLUCOPHAGE) 500 MG tablet  4. Hypertension  metoprolol succinate (TOPROL-XL) 50 MG 24 hr tablet, losartan (COZAAR) 50 MG tablet, hydrochlorothiazide (MICROZIDE) 12.5 MG capsule   I gave patient prednisone 20  3-2-2-1-1  For the shoulder pain. I am referring patient to Dr Einar Gip for second opinion on the CAD

## 2012-11-18 LAB — COMPREHENSIVE METABOLIC PANEL
ALT: 23 U/L (ref 0–53)
AST: 22 U/L (ref 0–37)
Albumin: 4.4 g/dL (ref 3.5–5.2)
Alkaline Phosphatase: 69 U/L (ref 39–117)
BUN: 26 mg/dL — ABNORMAL HIGH (ref 6–23)
CO2: 27 mEq/L (ref 19–32)
Calcium: 9.5 mg/dL (ref 8.4–10.5)
Chloride: 105 mEq/L (ref 96–112)
Creat: 2.09 mg/dL — ABNORMAL HIGH (ref 0.50–1.35)
Glucose, Bld: 106 mg/dL — ABNORMAL HIGH (ref 70–99)
Potassium: 4.8 mEq/L (ref 3.5–5.3)
Sodium: 136 mEq/L (ref 135–145)
Total Bilirubin: 0.3 mg/dL (ref 0.3–1.2)
Total Protein: 7.1 g/dL (ref 6.0–8.3)

## 2012-11-18 LAB — HEMOGLOBIN A1C
Hgb A1c MFr Bld: 6.3 % — ABNORMAL HIGH (ref <5.7)
Mean Plasma Glucose: 134 mg/dL — ABNORMAL HIGH (ref <117)

## 2012-11-18 LAB — SEDIMENTATION RATE: Sed Rate: 23 mm/hr — ABNORMAL HIGH (ref 0–16)

## 2012-12-07 ENCOUNTER — Other Ambulatory Visit: Payer: Self-pay | Admitting: Cardiology

## 2012-12-07 DIAGNOSIS — I739 Peripheral vascular disease, unspecified: Secondary | ICD-10-CM

## 2012-12-08 ENCOUNTER — Ambulatory Visit (INDEPENDENT_AMBULATORY_CARE_PROVIDER_SITE_OTHER): Payer: Managed Care, Other (non HMO) | Admitting: Physician Assistant

## 2012-12-08 VITALS — BP 128/70 | HR 68 | Temp 98.5°F | Resp 16 | Ht 69.0 in | Wt 174.0 lb

## 2012-12-08 DIAGNOSIS — E785 Hyperlipidemia, unspecified: Secondary | ICD-10-CM

## 2012-12-08 LAB — LIPID PANEL
Cholesterol: 156 mg/dL (ref 0–200)
HDL: 28 mg/dL — ABNORMAL LOW
LDL Cholesterol: 102 mg/dL — ABNORMAL HIGH (ref 0–99)
Total CHOL/HDL Ratio: 5.6 ratio
Triglycerides: 131 mg/dL
VLDL: 26 mg/dL (ref 0–40)

## 2012-12-08 NOTE — Progress Notes (Signed)
  Subjective:    Patient ID: AUNDRAE BATOR, male    DOB: [birth year], 56 y.o.   MRN: [id number]  HPI 56 year old male presents for lab draw per orders from Dr. Einar Gip. He is doing well without complaint.   PMHx: DM, HTN, hyperlipidemia    Review of Systems  HENT: Negative.   Respiratory: Negative.   Cardiovascular: Negative.   Neurological: Negative.   All other systems reviewed and are negative.       Objective:   Physical Exam  Constitutional: He is oriented to person, place, and time. He appears well-developed and well-nourished.  HENT:  Head: Normocephalic and atraumatic.  Right Ear: External ear normal.  Left Ear: External ear normal.  Eyes: Conjunctivae normal are normal.  Neck: Normal range of motion.  Cardiovascular: Normal rate.   Pulmonary/Chest: Effort normal.  Neurological: He is alert and oriented to person, place, and time.  Psychiatric: He has a normal mood and affect. His behavior is normal. Judgment and thought content normal.          Assessment & Plan:   1. Hyperlipidemia  Lipid panel   Labs drawn. Will fax to Dr. Einar Gip Follow up as planned with Dr. Joseph Art

## 2013-02-09 ENCOUNTER — Encounter: Payer: Self-pay | Admitting: Family Medicine

## 2013-02-09 ENCOUNTER — Ambulatory Visit (INDEPENDENT_AMBULATORY_CARE_PROVIDER_SITE_OTHER): Payer: Managed Care, Other (non HMO) | Admitting: Family Medicine

## 2013-02-09 ENCOUNTER — Ambulatory Visit: Payer: Managed Care, Other (non HMO)

## 2013-02-09 VITALS — BP 97/65 | HR 89 | Temp 97.6°F | Resp 16 | Ht 70.5 in | Wt 169.0 lb

## 2013-02-09 DIAGNOSIS — M25559 Pain in unspecified hip: Secondary | ICD-10-CM

## 2013-02-09 DIAGNOSIS — M25519 Pain in unspecified shoulder: Secondary | ICD-10-CM

## 2013-02-09 DIAGNOSIS — D649 Anemia, unspecified: Secondary | ICD-10-CM

## 2013-02-09 DIAGNOSIS — IMO0001 Reserved for inherently not codable concepts without codable children: Secondary | ICD-10-CM

## 2013-02-09 LAB — CBC WITH DIFFERENTIAL/PLATELET
Basophils Relative: 0 % (ref 0–1)
Hemoglobin: 10.1 g/dL — ABNORMAL LOW (ref 13.0–17.0)
Lymphocytes Relative: 34 % (ref 12–46)
Lymphs Abs: 2.4 10*3/uL (ref 0.7–4.0)
MCHC: 33.8 g/dL (ref 30.0–36.0)
Monocytes Relative: 7 % (ref 3–12)
Neutro Abs: 4 10*3/uL (ref 1.7–7.7)
Neutrophils Relative %: 57 % (ref 43–77)
RBC: 3.33 MIL/uL — ABNORMAL LOW (ref 4.22–5.81)
WBC: 7.1 10*3/uL (ref 4.0–10.5)

## 2013-02-09 IMAGING — CR DG SHOULDER 2+V*L*
2 series · 2 of 2 positions shown · non-contrast
Comparison: Right shoulder.

CLINICAL DATA: History of shoulder pain.

LEFT SHOULDER - 2+ VIEW

[AP (1 of 2)]
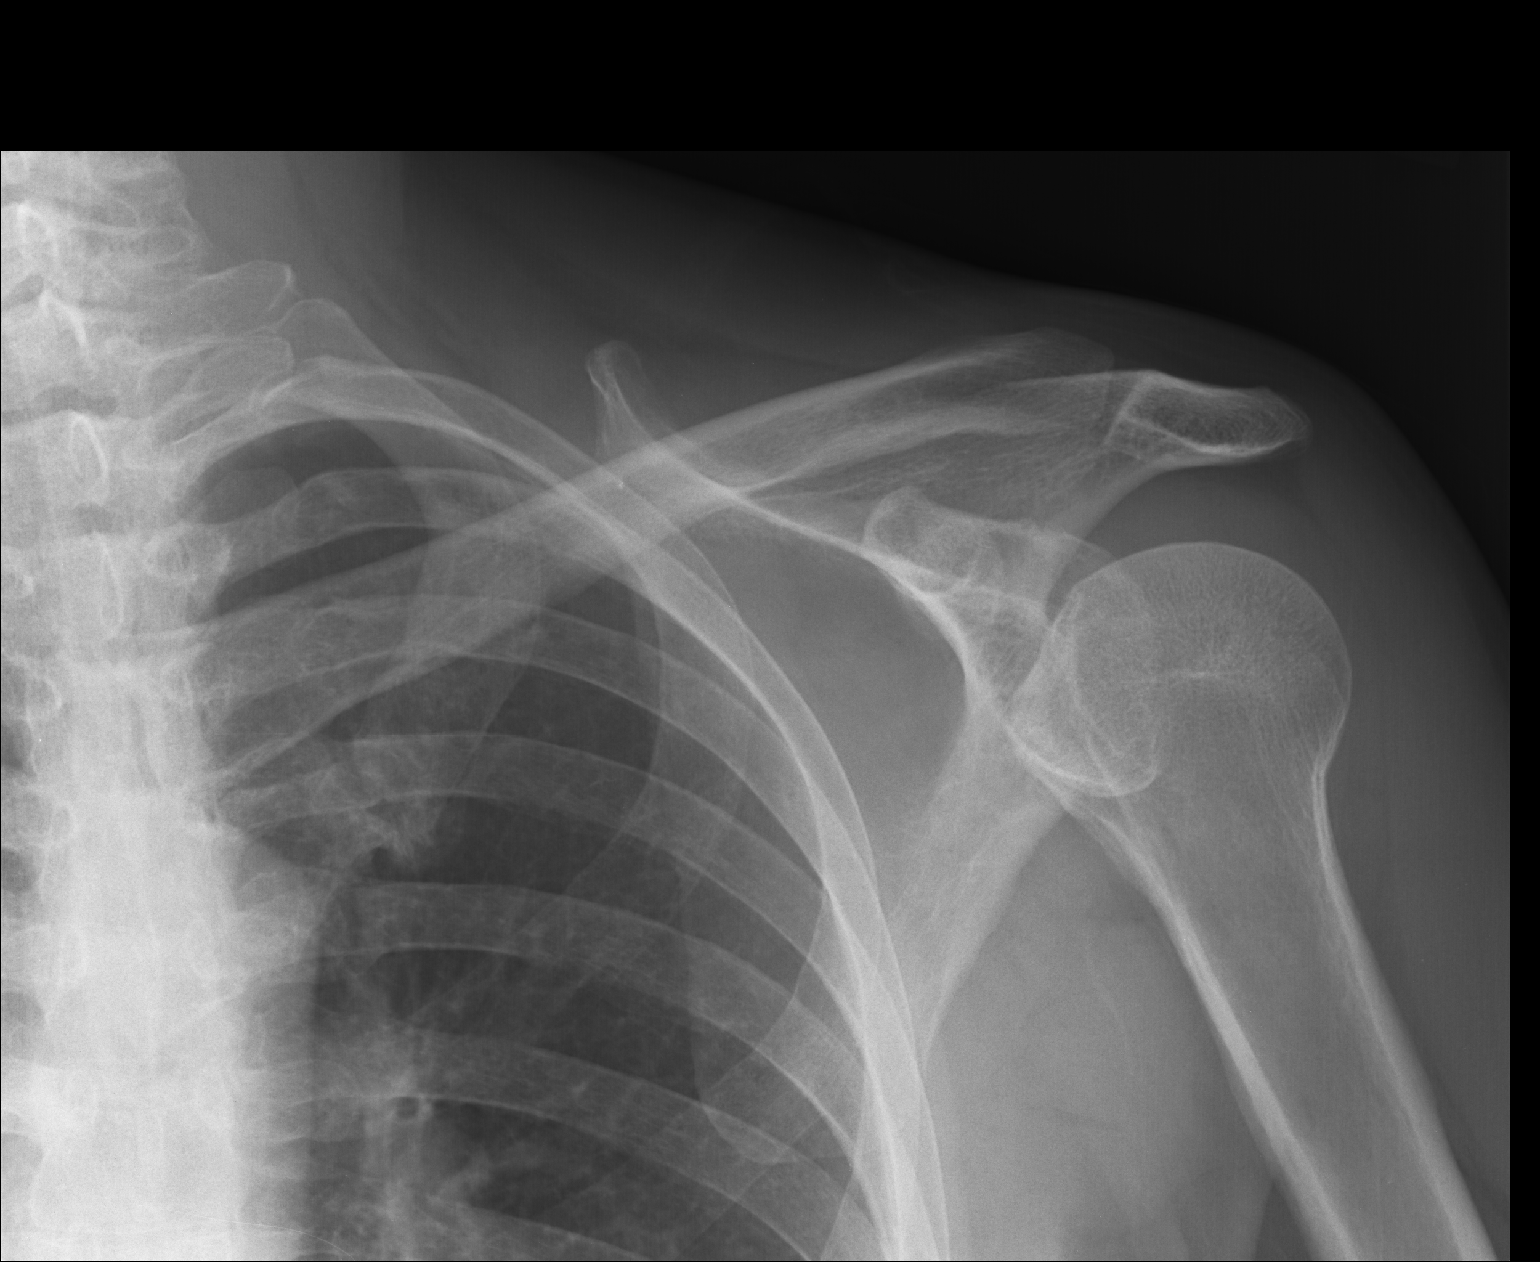

[AP (2 of 2)]
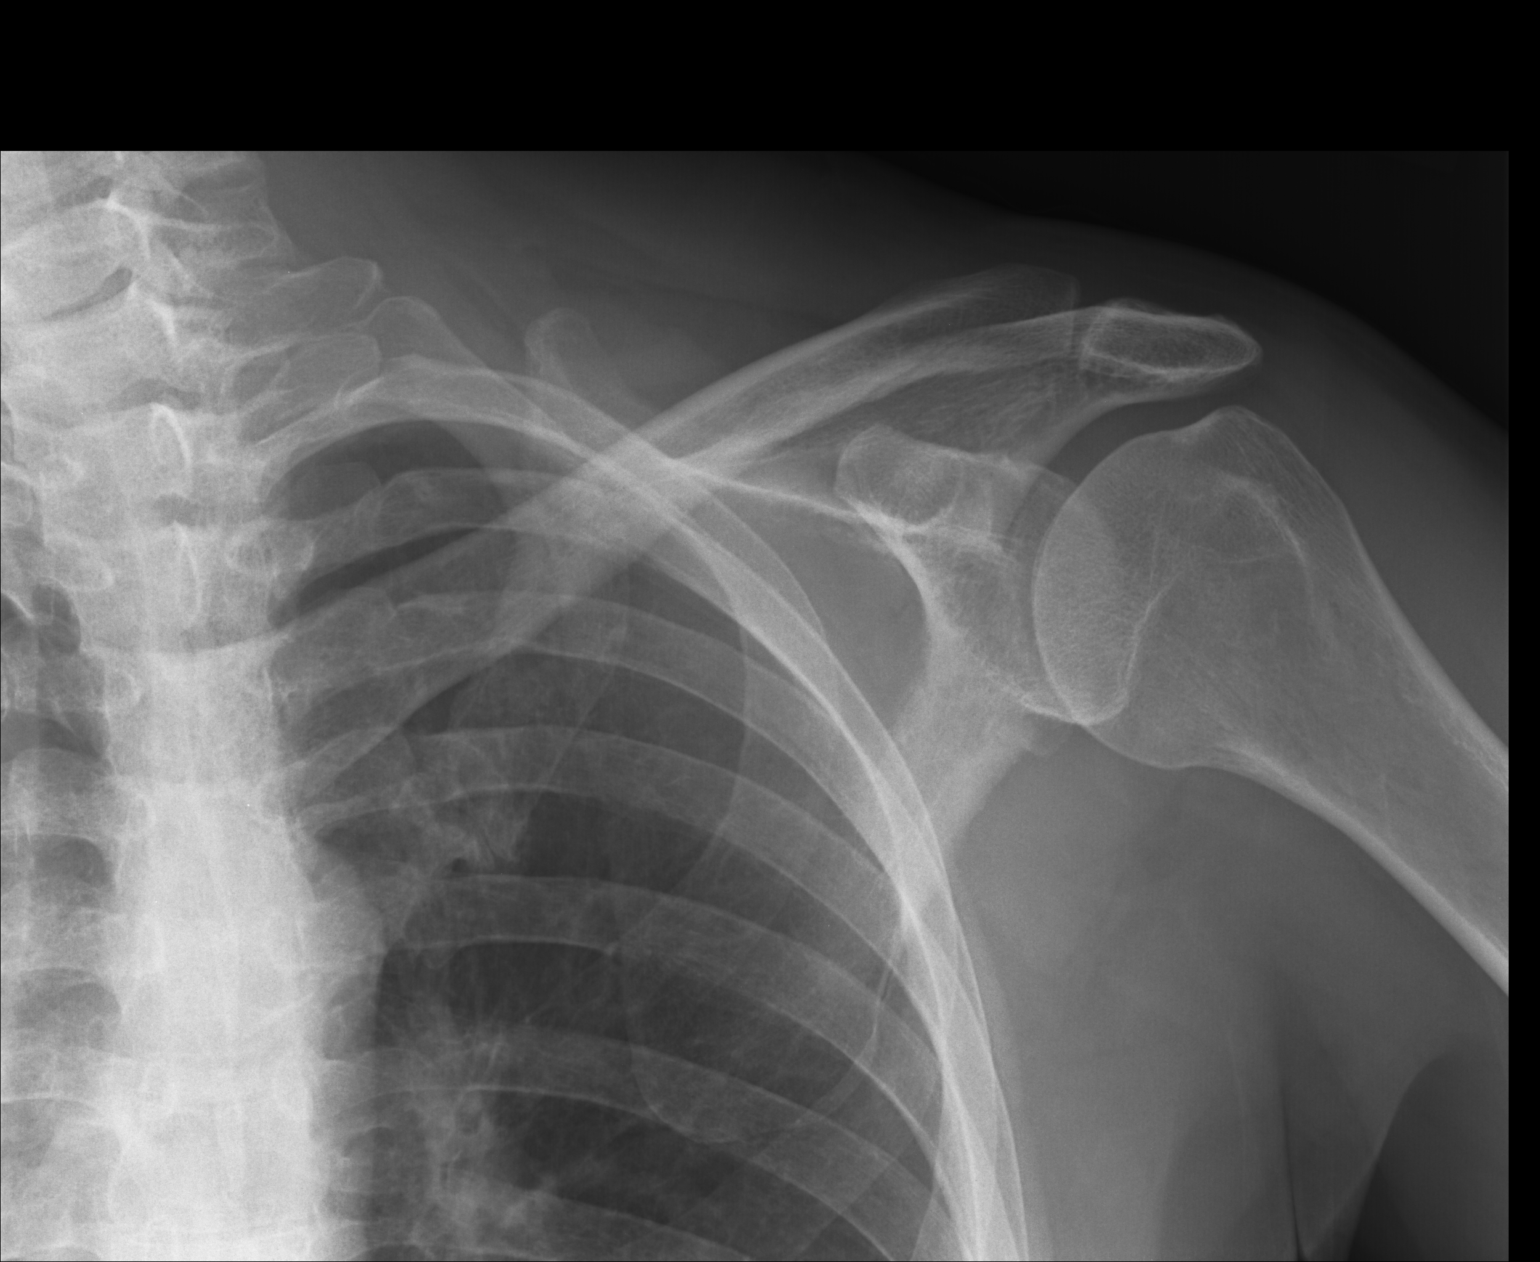

[2 of 2 positions shown; findings below may reference images not displayed]

FINDINGS: No AC joint lesion is seen..  No fracture or bony
destruction is seen.  No calcific bursitis or calcific tendonitis
is evident.  No cervical rib is seen.  Glenohumeral joint spaces
are preserved.
IMPRESSION: No left shoulder abnormality is identified.

## 2013-02-09 IMAGING — CR DG SHOULDER 2+V*R*
2 series · 2 of 2 positions shown · non-contrast
Comparison: Left shoulder.

CLINICAL DATA: History of shoulder pain.

RIGHT SHOULDER - 2+ VIEW

[AP (1 of 2)]
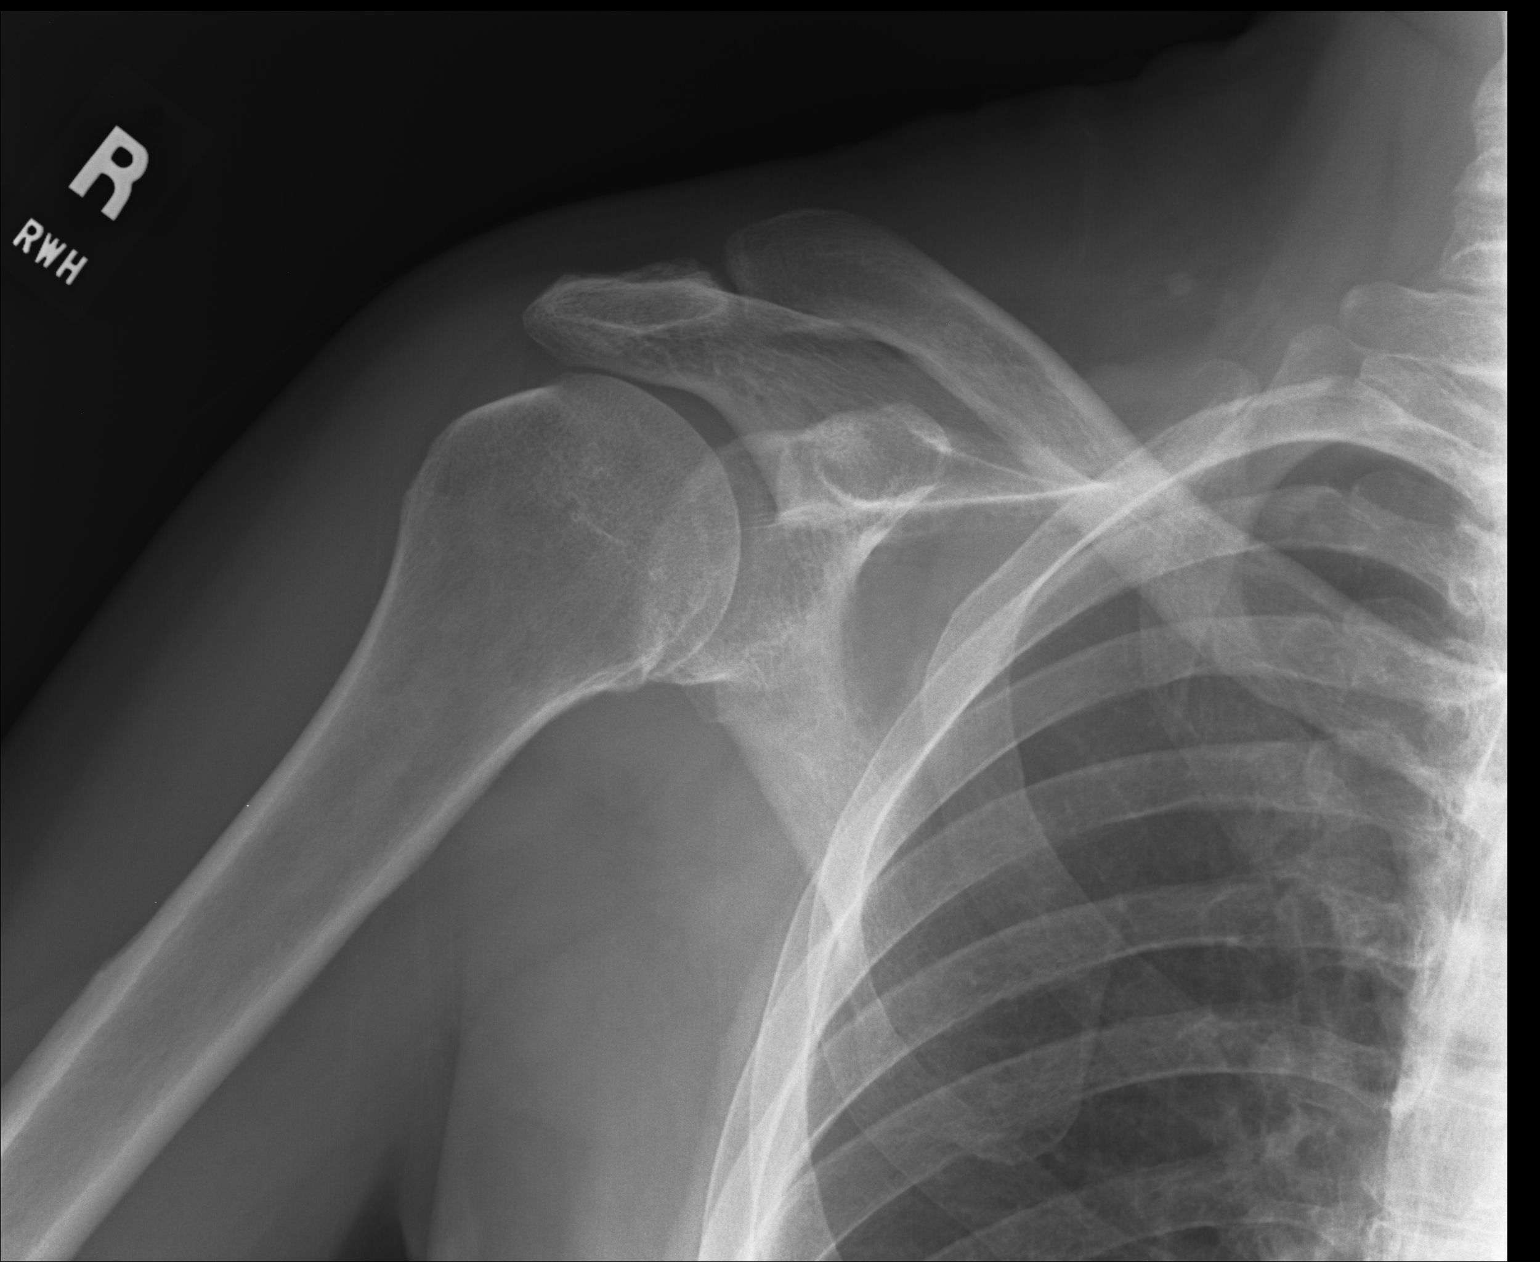

[AP (2 of 2)]
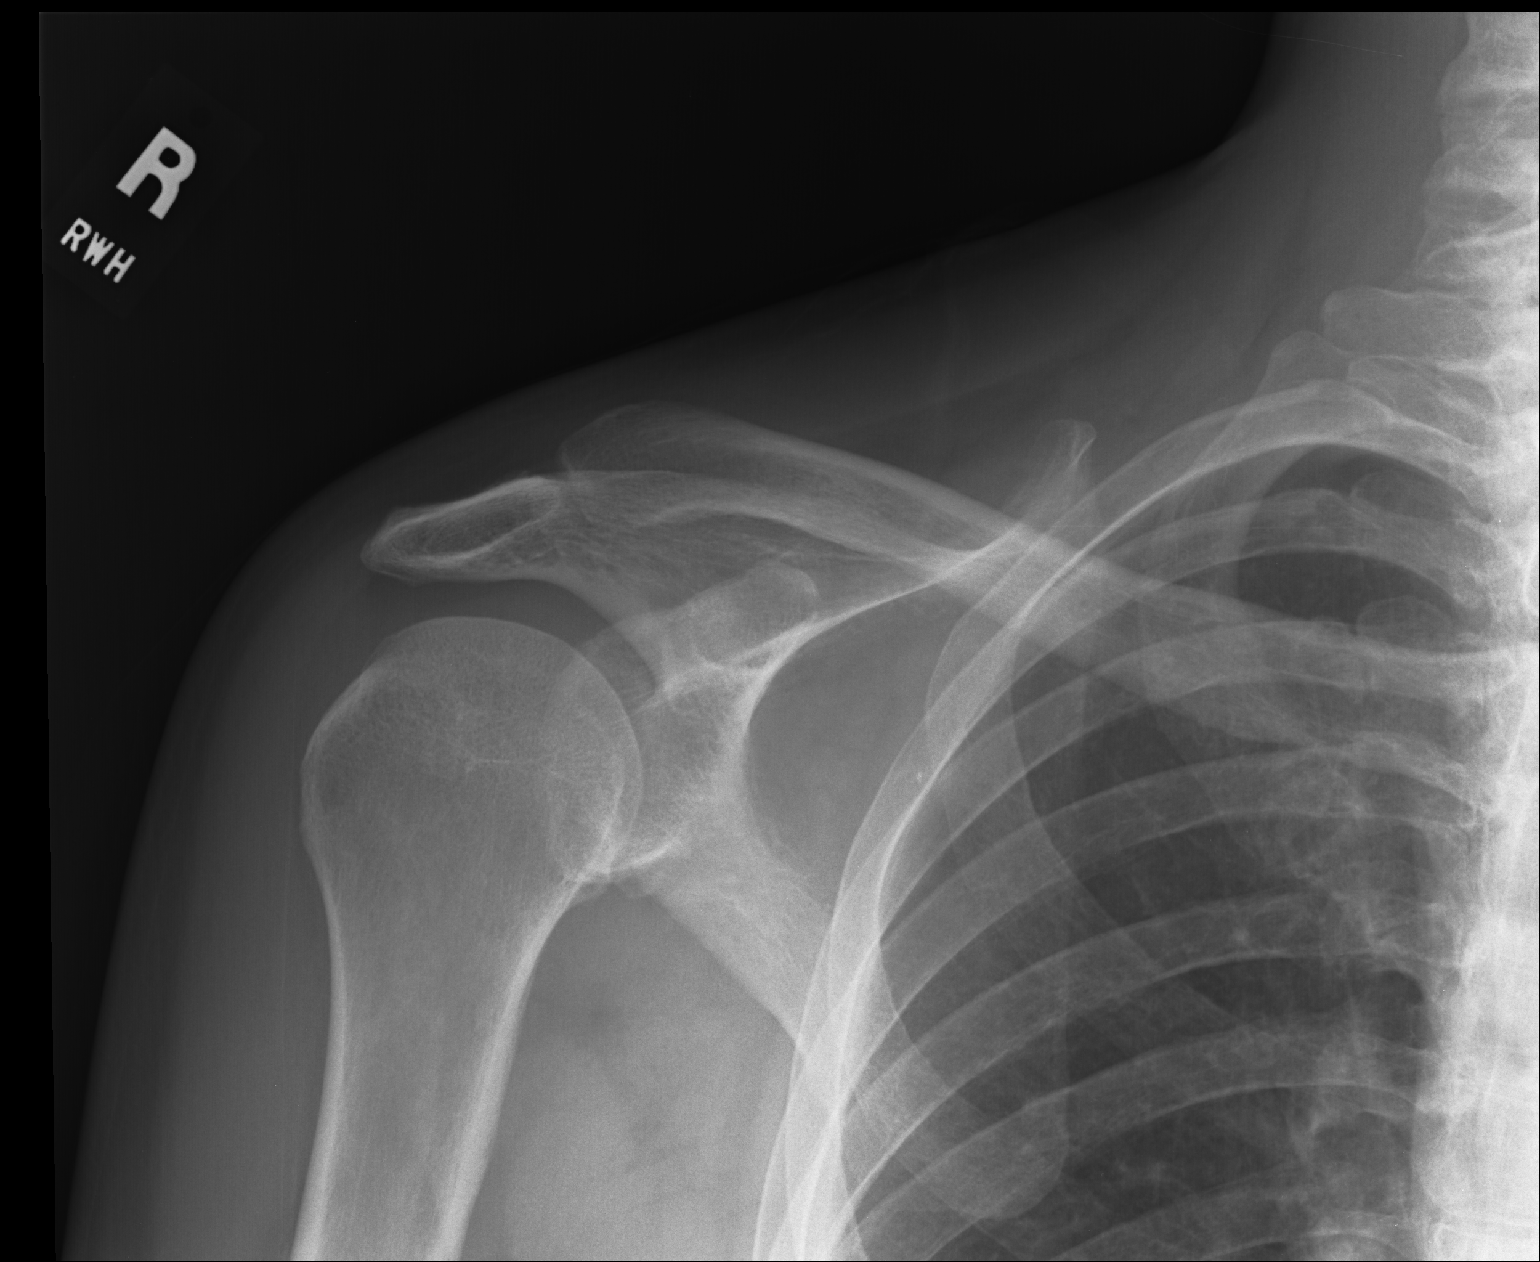

[2 of 2 positions shown; findings below may reference images not displayed]

FINDINGS: Alignment is normal.  Joint spaces are preserved.  No
fracture or dislocation is evident.  No soft tissue lesions are
seen.  No calcific tendonitis or calcific bursitis is evident.  No
cervical rib is seen
IMPRESSION: No right shoulder lesion is identified.

## 2013-02-09 NOTE — Progress Notes (Signed)
S:  This 57 y.o. Cauc male has chronic migratory arthralgias involving hips and shoulders; he denies trauma. Hip pain 1st occurred more than 3 years ago. Currently, he c/o R shoulder pain for 3-4 months. He was evaluated in Nov 2013; pain is not improved. He denies neck pain.   Pt is recovering from recent R eye surgery.  ROS: Negative for fever/chills, abnormal weight change, diaphoresis, fatigue, CP or tightness, palpitations, SOB or DOE, myalgias, rashes, pruritis, HA, dizziness, numbness or weakness or syncope.   O:  Filed Vitals:   02/09/13 1429  BP: 97/65  Pulse: 89  Temp: 97.6 F (36.4 C)  Resp: 16   Pt notes BP reading to day is ~ 20 points lower than usual readings; when he is seen at 102, readings are 20 points above normal.  GEN: In NAD; WN, WD. Appears chronically ill. HENT: [place]/AT; EOMI w/ conjunctival hemorrhage in R lateral aspect. Otherwise clear and non-icteric. NECK: Supple and no tender areas palpated. COR: RRR. LUNGS: Normal resp rate and effort. BACK: Muscle atrophy in R supraspinatous. MS: Decreased ROM in R shoulder - limited internal rotation. Giveway noted in anterior plane against resistance. NEURO: A&O x 3; CNs intact. Mentation- flat affect. Otherwise nonfocal.   [facility] reading (PRIMARY) by  Dr. Leward Quan: Bilateral shoulders-  No fracture or dislocatiion.  A/P: 1. Pain in joint, shoulder region  Pt declines prescription pain medication; advised topical analgesic   DG Shoulder Left   DG Shoulder Right   Ambulatory referral to Orthopedic Surgery  2. Pain in joint, pelvic region and thigh    3. Myalgia and myositis  Hepatitis C antibody      Vitamin D 25 hydroxy  4. Anemia, unspecified  CBC with Differential

## 2013-02-09 NOTE — Patient Instructions (Addendum)
For shoulder pain, try topical Arnica Gel which can be applied 3 times daily to painful muscles and joints.. Also, Fish Oil has anti-inflammatory properties and can be taken daily (do not take it with other solid pills and capsules).

## 2013-02-10 ENCOUNTER — Other Ambulatory Visit: Payer: Self-pay | Admitting: Family Medicine

## 2013-02-10 DIAGNOSIS — M25519 Pain in unspecified shoulder: Secondary | ICD-10-CM | POA: Insufficient documentation

## 2013-02-10 LAB — VITAMIN D 25 HYDROXY (VIT D DEFICIENCY, FRACTURES): Vit D, 25-Hydroxy: 11 ng/mL — ABNORMAL LOW (ref 30–89)

## 2013-02-10 LAB — HEPATITIS C ANTIBODY: HCV Ab: REACTIVE — AB

## 2013-02-10 MED ORDER — ERGOCALCIFEROL 1.25 MG (50000 UT) PO CAPS: 50000.0000 [IU] | ORAL_CAPSULE | ORAL | Status: AC

## 2013-02-10 NOTE — Progress Notes (Signed)
Quick Note:  Please call pt and advise that the following labs are abnormal...  Test for Hepatitis C is positive; this could explain the joint pains you are having. Please schedule follow-up in the office to discuss further evaluation. You are still anemic. Your Vitamin D level is very low; I am prescribing a supplement to be taken once a week (Vitamin D 50000 units per capsule). This is at your pharmacy for you to pick up at your earliest convenience.  Copy to pt.   ______

## 2013-02-15 ENCOUNTER — Encounter: Payer: Self-pay | Admitting: Radiology

## 2013-02-19 ENCOUNTER — Ambulatory Visit: Payer: Managed Care, Other (non HMO) | Admitting: Family Medicine

## 2013-09-07 ENCOUNTER — Ambulatory Visit (INDEPENDENT_AMBULATORY_CARE_PROVIDER_SITE_OTHER): Payer: 59 | Admitting: Family Medicine

## 2013-09-07 ENCOUNTER — Ambulatory Visit: Payer: 59

## 2013-09-07 VITALS — BP 114/62 | HR 95 | Temp 98.8°F | Resp 18 | Ht 71.0 in | Wt 172.0 lb

## 2013-09-07 DIAGNOSIS — E119 Type 2 diabetes mellitus without complications: Secondary | ICD-10-CM

## 2013-09-07 DIAGNOSIS — B1920 Unspecified viral hepatitis C without hepatic coma: Secondary | ICD-10-CM

## 2013-09-07 DIAGNOSIS — Z1159 Encounter for screening for other viral diseases: Secondary | ICD-10-CM

## 2013-09-07 DIAGNOSIS — N189 Chronic kidney disease, unspecified: Secondary | ICD-10-CM

## 2013-09-07 DIAGNOSIS — M549 Dorsalgia, unspecified: Secondary | ICD-10-CM

## 2013-09-07 LAB — POCT CBC
Granulocyte percent: 57.7 %G (ref 37–80)
HCT, POC: 31.1 % — AB (ref 43.5–53.7)
MCV: 93 fL (ref 80–97)
Platelet Count, POC: 155 10*3/uL (ref 142–424)
RBC: 3.34 M/uL — AB (ref 4.69–6.13)

## 2013-09-07 LAB — COMPREHENSIVE METABOLIC PANEL
AST: 23 U/L (ref 0–37)
Albumin: 4.1 g/dL (ref 3.5–5.2)
BUN: 24 mg/dL — ABNORMAL HIGH (ref 6–23)
Calcium: 9.8 mg/dL (ref 8.4–10.5)
Chloride: 106 mEq/L (ref 96–112)
Glucose, Bld: 187 mg/dL — ABNORMAL HIGH (ref 70–99)
Potassium: 5.1 mEq/L (ref 3.5–5.3)
Total Protein: 6.6 g/dL (ref 6.0–8.3)

## 2013-09-07 IMAGING — CR DG LUMBAR SPINE COMPLETE 4+V
5 series · 5 of 5 positions shown · non-contrast
Comparison: None.

CLINICAL DATA: Pain

LUMBAR SPINE - COMPLETE 4+ VIEW

[AP]
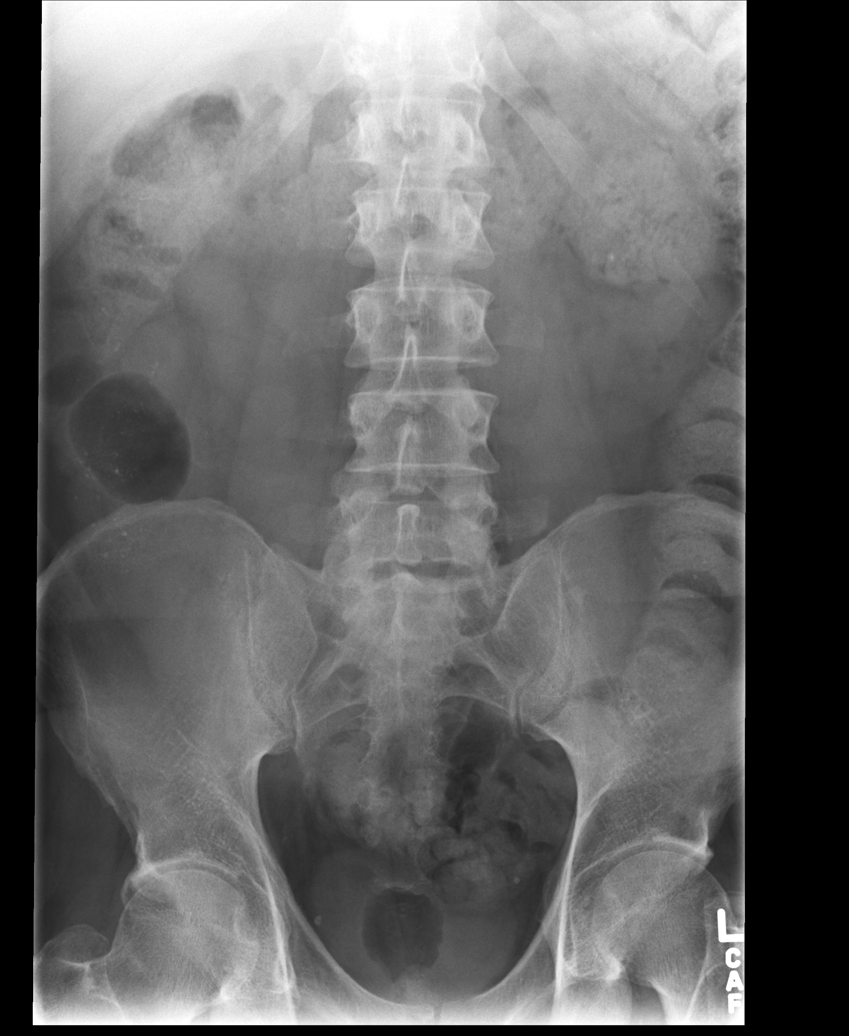

[rpo]
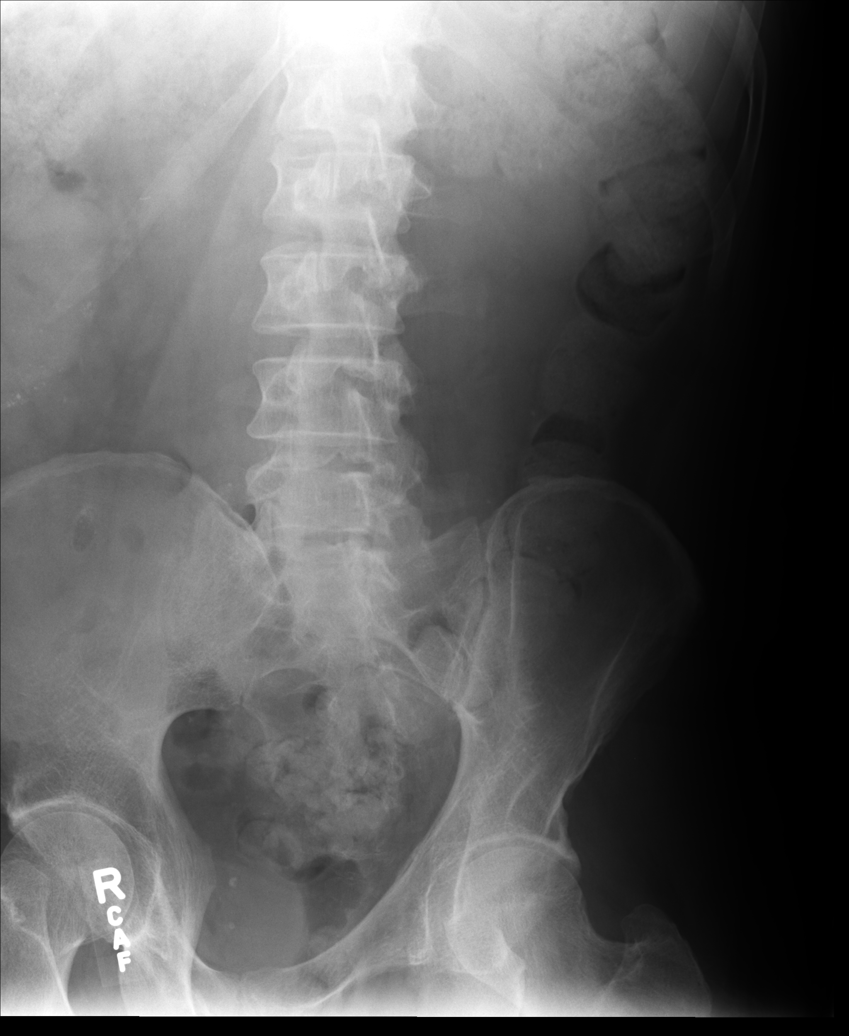

[lpo]
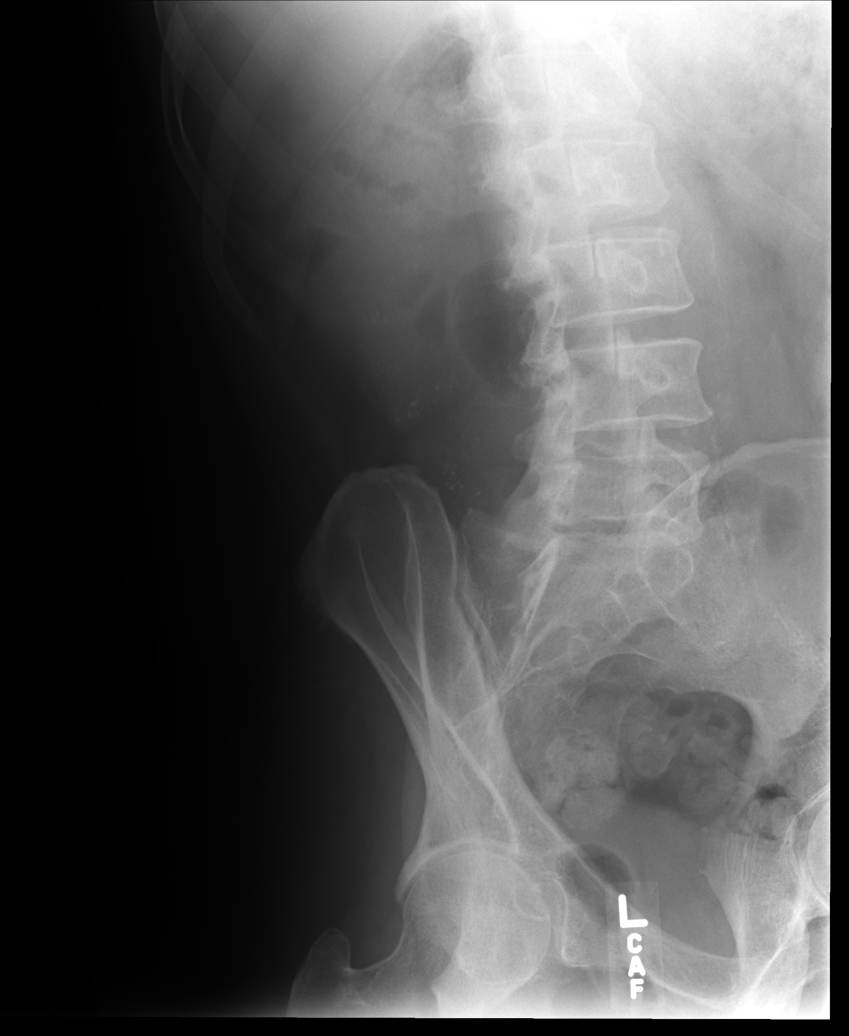

[lateral]
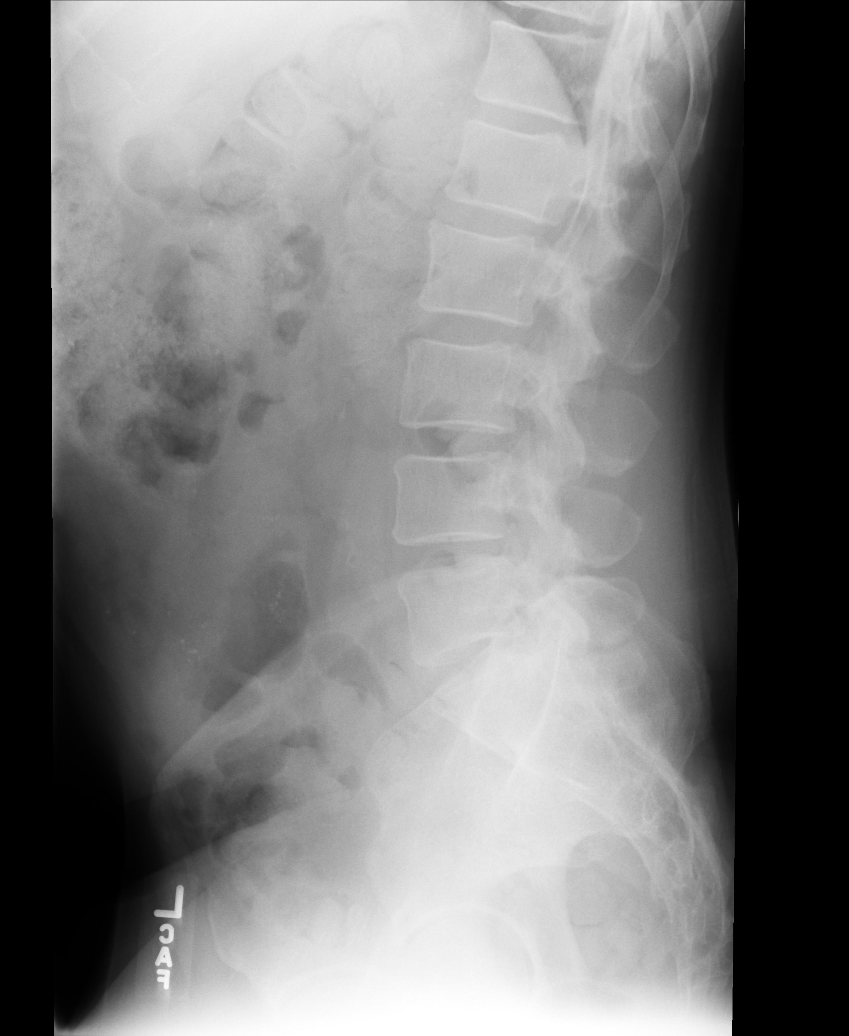

[l5 s1]
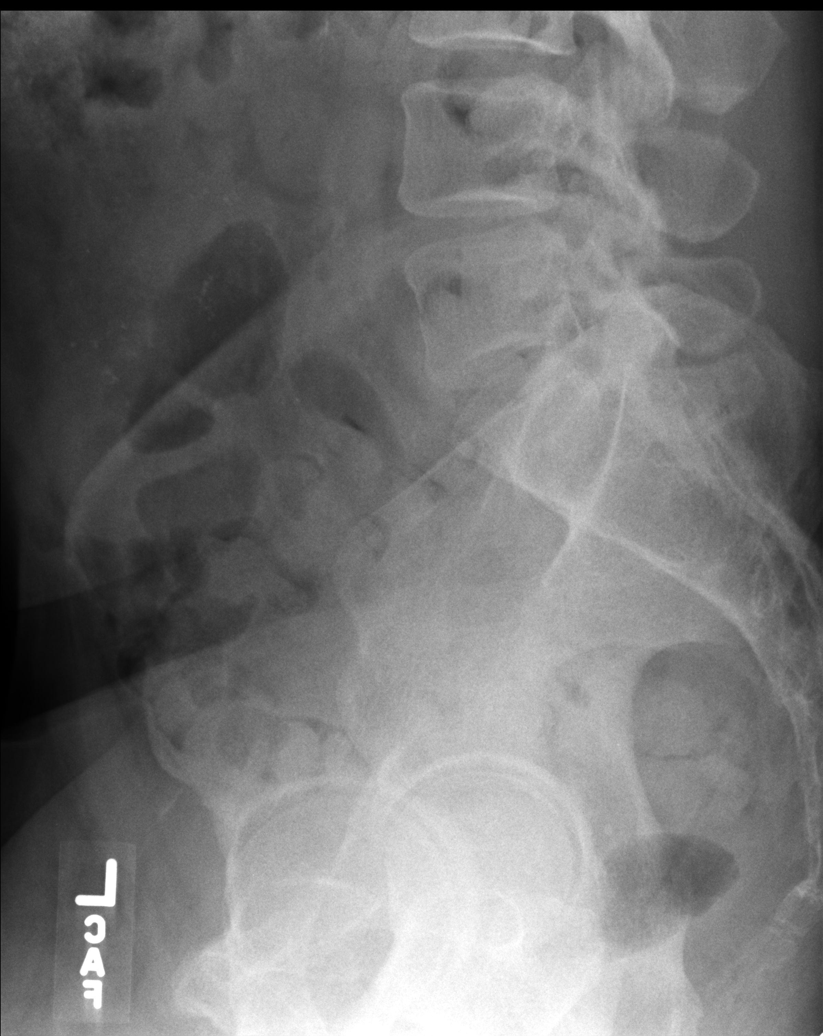

[5 of 5 positions shown; findings below may reference images not displayed]

FINDINGS: Frontal, lateral, spot lumbosacral lateral, and
bilateral oblique views were obtained.  There are five non-rib
bearing lumbar type vertebral bodies.  There is no fracture or
spondylolisthesis. There is slight disc space narrowing at L4-5.
Other disc spaces appear normal.  There is no appreciable facet
arthropathy.
IMPRESSION: Slight disc space narrowing L4-5. No fracture or spondylolisthesis.

## 2013-09-07 MED ORDER — TRAMADOL HCL 50 MG PO TABS: 50.0000 mg | ORAL_TABLET | Freq: Three times a day (TID) | ORAL | Status: DC | PRN

## 2013-09-07 MED ORDER — CYCLOBENZAPRINE HCL 10 MG PO TABS: 10.0000 mg | ORAL_TABLET | Freq: Two times a day (BID) | ORAL | Status: DC | PRN

## 2013-09-07 NOTE — Patient Instructions (Addendum)
Try the flexeril and/ or tramadol for your back pain. These medications can make you sleepy, so avoid using in combination. If your back does not feel better we can try some prednisone- please give me a call in this case.  I will be in touch with the rest of your labs once they come in.

## 2013-09-07 NOTE — Progress Notes (Addendum)
[facility] [address], [place] [address] [phone]  Date:  09/07/2013   Name:  Duane Hall   DOB:  [birth year]   MRN:  [id number]  PCP:  Hayden Rasmussen., MD    Chief Complaint: Back Pain   History of Present Illness:  Duane Hall is a 57 y.o. very pleasant male patient who presents with the following:  History of DM, HTN, asthma, glaucoma.  Here today with back pain.  Also recently tested positive for Hep C. He has not yet followed up on this test because "I did not believe it."   He thinks he injured his back. He has had back pain for awhile.  However, about 10 days ago he moved furniture so they could clean the carpets.  He picked up a very heavy nightstand. No sudden pain, but he noted onset of back pain later on the same day.   He has a "constant dull" pain down his left leg.  He does not note any weakness.  His feet are "always numb because of the diabetes." no new numbness or weakness His father also had problems with his back.  No history of back surgery  No bowel or bladder incontinence issues .  He did not sleep well last night due to pain.  "There is not really any comfortable position."   He does have a history of anemia. He states his father also had anemia, and an exhaustive work- up failed to show a diagnosis.   In 2012 he had a normal ferritin and normal hg electrophoresis   Hg 09/2011 10.1. MCV is normla Patient Active Problem List   Diagnosis Date Noted  . Pain in joint, shoulder region 02/10/2013  . Type II diabetes mellitus 02/24/2012  . Hypertension 02/24/2012  . Asthma 02/24/2012    Past Medical History  Diagnosis Date  . Cataract   . Glaucoma   . Diabetes mellitus     History reviewed. No pertinent past surgical history.  History  Substance Use Topics  . Smoking status: Current Every Day Smoker -- 0.80 packs/day    Types: Cigarettes  . Smokeless tobacco: Not on file  . Alcohol Use: Not on file    Family  History  Problem Relation Age of Onset  . Cancer Father     No Known Allergies  Medication list has been reviewed and updated.  Current Outpatient Prescriptions on File Prior to Visit  Medication Sig Dispense Refill  . aspirin 81 MG tablet Take 81 mg by mouth daily.      . carvedilol (COREG) 6.25 MG tablet Take 6.25 mg by mouth 2 (two) times daily with a meal.      . glipiZIDE (GLUCOTROL XL) 5 MG 24 hr tablet Take 1 tablet (5 mg total) by mouth daily.  90 tablet  3  . hydrochlorothiazide (MICROZIDE) 12.5 MG capsule Take 1 capsule (12.5 mg total) by mouth daily.  90 capsule  3  . losartan (COZAAR) 50 MG tablet Take 1 tablet (50 mg total) by mouth daily.  90 tablet  3  . metFORMIN (GLUCOPHAGE) 500 MG tablet Take 1 tablet (500 mg total) by mouth 2 (two) times daily with a meal.  180 tablet  3  . ergocalciferol (DRISDOL) 50000 UNITS capsule Take 1 capsule (50,000 Units total) by mouth once a week.  4 capsule  5  . Lancet Devices (AUTO-LANCETS) MISC 100 Units by Does not apply route every morning.  100 each  12  . metoprolol succinate (TOPROL-XL) 50 MG 24 hr tablet Take 1 tablet (50 mg total) by mouth daily. Take with or immediately following a meal.  90 tablet  3   No current facility-administered medications on file prior to visit.    Review of Systems:  As per HPI- otherwise negative.  Wt Readings from Last 3 Encounters:  09/07/13 172 lb (78.019 kg)  02/09/13 169 lb (76.658 kg)  12/08/12 174 lb (78.926 kg)   He did have a colonoscopy 2 years ago- it looked ok, 7 non- cancerous polyps  He does not use tylenol. He does not drink alcohol.     Physical Examination: Filed Vitals:   09/07/13 0823  BP: 114/62  Pulse: 95  Temp: 98.8 F (37.1 C)  Resp: 18   Filed Vitals:   09/07/13 0823  Height: 5\' 11"  (1.803 m)  Weight: 172 lb (78.019 kg)   Body mass index is 24 kg/(m^2). Ideal Body Weight: Weight in (lb) to have BMI = 25: 178.9  GEN: WDWN, NAD, Non-toxic, A & O x 3,  looks well HEENT: Atraumatic, Normocephalic. Neck supple. No masses, No LAD. Ears and Nose: No external deformity. CV: RRR, No M/G/R. No JVD. No thrill. No extra heart sounds. PULM: CTA B, no wheezes, crackles, rhonchi. No retractions. No resp. distress. No accessory muscle use. ABD: S, NT, ND, +BS. No rebound. No HSM. EXTR: No c/c/e NEURO Normal gait.  PSYCH: Normally interactive. Conversant. Not depressed or anxious appearing.  Calm demeanor.  Back he has tenderness over the left sciatic notch.  Limited flexion, normal extension.  Positive SLR test bilateraly, left > right.  Normal leg strength, normal patellar and ankle DTR bilaterally  [facility] reading (PRIMARY) by  Dr. Lorelei Pont. Lumbar spine: normal, minimal to no degenerative change  LUMBAR SPINE - COMPLETE 4+ VIEW  Comparison: None.  Findings: Frontal, lateral, spot lumbosacral lateral, and bilateral oblique views were obtained. There are five non-rib bearing lumbar type vertebral bodies. There is no fracture or spondylolisthesis. There is slight disc space narrowing at L4-5. Other disc spaces appear normal. There is no appreciable facet arthropathy.  IMPRESSION: Slight disc space narrowing L4-5. No fracture or spondylolisthesis.  Results for orders placed in visit on 09/07/13  POCT CBC      Result Value Range   WBC 7.0  4.6 - 10.2 K/uL   Lymph, poc 2.5  0.6 - 3.4   POC LYMPH PERCENT 35.4  10 - 50 %L   MID (cbc) 0.5  0 - 0.9   POC MID % 6.9  0 - 12 %M   POC Granulocyte 4.0  2 - 6.9   Granulocyte percent 57.7  37 - 80 %G   RBC 3.34 (*) 4.69 - 6.13 M/uL   Hemoglobin 9.9 (*) 14.1 - 18.1 g/dL   HCT, POC 31.1 (*) 43.5 - 53.7 %   MCV 93.0  80 - 97 fL   MCH, POC 29.6  27 - 31.2 pg   MCHC 31.8  31.8 - 35.4 g/dL   RDW, POC 13.1     Platelet Count, POC 155  142 - 424 K/uL   MPV 6.8  0 - 99.8 fL  POCT GLYCOSYLATED HEMOGLOBIN (HGB A1C)      Result Value Range   Hemoglobin A1C 5.8      Assessment and Plan: Back pain - Plan:  DG Lumbar Spine Complete, cyclobenzaprine (FLEXERIL) 10 MG tablet, traMADol (ULTRAM) 50 MG tablet  Type II or unspecified type diabetes  mellitus without mention of complication, not stated as uncontrolled - Plan: POCT CBC, Comprehensive metabolic panel, POCT glycosylated hemoglobin (Hb A1C)  Need for hepatitis C screening test - Plan: Hepatitis C RNA quantitative  Treat for lumbar strain with tramadol and flexeril.  Avoid combining both temporally  Let me know if not better in the next few days. DM well controlled.   He has had farily extensive anemia w/u in the past without any particular diagnosis.  His father did have anemia his whole life, no dx ever found.  No other family history of same.  At this time he declines hematology referral  Follow-up if not better Defer prednisone for now due to DM, but consider if not better   Signed Lamar Blinks, MD  9/14- called him back to discuss labs.  His back feels better- his leg does hurt some but he does not want to try prednisone as of yet.    unfortunately his hep C test is positive.  I will refer him to a hepatologist.  He understands this information.  Will also send him some information on Hep C and treatments  Also, his creatinine is over 2.  We need to stop his metformin and have him see nephrology.   I will take care of these 2 appts.  His A1c is good and he will probably do ok without metformin.  We will need to keep an eye on his glucose

## 2013-09-09 LAB — HEPATITIS C RNA QUANTITATIVE: HCV Quantitative Log: 6.31 {Log} — ABNORMAL HIGH (ref <1.18)

## 2013-09-12 ENCOUNTER — Encounter: Payer: Self-pay | Admitting: Family Medicine

## 2013-09-12 NOTE — Addendum Note (Signed)
Addended by: Lamar Blinks C on: 09/12/2013 02:13 PM   Modules accepted: Orders, Medications

## 2013-10-05 ENCOUNTER — Other Ambulatory Visit: Payer: Self-pay | Admitting: Internal Medicine

## 2013-10-05 DIAGNOSIS — R1011 Right upper quadrant pain: Secondary | ICD-10-CM

## 2013-10-08 ENCOUNTER — Ambulatory Visit
Admission: RE | Admit: 2013-10-08 | Discharge: 2013-10-08 | Disposition: A | Discharge status: Home / Self Care | Payer: 59 | Source: Ambulatory Visit | Attending: Internal Medicine | Admitting: Internal Medicine

## 2013-10-08 DIAGNOSIS — R1011 Right upper quadrant pain: Secondary | ICD-10-CM

## 2013-10-15 ENCOUNTER — Encounter: Payer: Self-pay | Admitting: Family Medicine

## 2013-10-15 DIAGNOSIS — B1920 Unspecified viral hepatitis C without hepatic coma: Secondary | ICD-10-CM | POA: Insufficient documentation

## 2013-11-14 ENCOUNTER — Other Ambulatory Visit: Payer: Self-pay | Admitting: Family Medicine

## 2013-11-20 ENCOUNTER — Other Ambulatory Visit: Payer: Self-pay | Admitting: Family Medicine

## 2013-12-21 ENCOUNTER — Telehealth: Payer: Self-pay | Admitting: Radiology

## 2013-12-21 NOTE — Telephone Encounter (Signed)
[facility] called asking about the hepatology clinic referral, information provided for her.

## 2014-03-28 ENCOUNTER — Encounter: Payer: Self-pay | Admitting: Family Medicine

## 2014-03-28 DIAGNOSIS — E119 Type 2 diabetes mellitus without complications: Secondary | ICD-10-CM

## 2014-04-19 ENCOUNTER — Encounter: Payer: Self-pay | Admitting: Family Medicine

## 2014-08-03 ENCOUNTER — Telehealth: Payer: Self-pay

## 2014-08-03 ENCOUNTER — Encounter: Payer: Self-pay | Admitting: Family Medicine

## 2014-08-03 DIAGNOSIS — N189 Chronic kidney disease, unspecified: Secondary | ICD-10-CM | POA: Insufficient documentation

## 2014-08-03 NOTE — Telephone Encounter (Signed)
Clld pt - advsd of need to schedule follow up diabetes mgmt appt - pt advsd he has current lab results from his kidney specialist. He stated he will call them tomorrow morning and have them fax the current lab work to Dr. Lillie Fragmin attention for her review. I gave pt both fax numbers: [phone] and [phone]. Pt verified and confirmed he has both and will forward to his specialist's office.

## 2014-08-15 ENCOUNTER — Encounter: Payer: Self-pay | Admitting: Family Medicine

## 2014-09-12 ENCOUNTER — Ambulatory Visit (INDEPENDENT_AMBULATORY_CARE_PROVIDER_SITE_OTHER): Payer: 59 | Admitting: Family Medicine

## 2014-09-12 ENCOUNTER — Encounter: Payer: Self-pay | Admitting: Family Medicine

## 2014-09-12 VITALS — BP 100/64 | HR 96 | Temp 97.8°F | Resp 16 | Ht 70.0 in | Wt 180.2 lb

## 2014-09-12 DIAGNOSIS — E1165 Type 2 diabetes mellitus with hyperglycemia: Secondary | ICD-10-CM

## 2014-09-12 DIAGNOSIS — IMO0002 Reserved for concepts with insufficient information to code with codable children: Secondary | ICD-10-CM

## 2014-09-12 DIAGNOSIS — E118 Type 2 diabetes mellitus with unspecified complications: Principal | ICD-10-CM

## 2014-09-12 DIAGNOSIS — D638 Anemia in other chronic diseases classified elsewhere: Secondary | ICD-10-CM | POA: Insufficient documentation

## 2014-09-12 LAB — LIPID PANEL
CHOLESTEROL: 87 mg/dL (ref 0–200)
HDL: 31 mg/dL — AB (ref >39)
LDL CALC: 43 mg/dL (ref 0–99)
Total CHOL/HDL Ratio: 2.8 Ratio
Triglycerides: 67 mg/dL (ref <150)
VLDL: 13 mg/dL (ref 0–40)

## 2014-09-12 NOTE — Patient Instructions (Signed)
Good to see you today.  I will send you a copy of your cholesterol and will also send a copy to Dr. Einar Gip.   We may want to cut down on your losartan- I will touch base with Dr. Moshe Cipro to make sure this is ok with her.  Your BP is running on the low side

## 2014-09-12 NOTE — Progress Notes (Signed)
[facility] [address], [place] [address] [phone]  Date:  09/12/2014   Name:  Duane Hall   DOB:  [birth year]   MRN:  [id number]  PCP:  Hayden Rasmussen., MD    Chief Complaint: Follow-up   History of Present Illness:  Duane Hall is a 58 y.o. very pleasant male patient who presents with the following:  Here today to discuss his DM.   Lab Results  Component Value Date   HGBA1C 5.8 09/07/2013   He also has Hep C which is has been treated.  He is no longer under treatment for his Hep C- he was treated from 2/15- 4/15.  His nephrologist is Vanetta Mulders.    He is on glipizide 10mg  now- he had been on metformin, but this was DC per Dr. Moshe Cipro and his glipizide was increased.    He is fasting this am except for pepsi.   Declines a flu shot today  BP Readings from Last 3 Encounters:  09/12/14 100/64  09/07/13 114/62  02/09/13 97/65     Patient Active Problem List   Diagnosis Date Noted  . Chronic renal impairment 08/03/2014  . Hepatitis C 10/15/2013  . Pain in joint, shoulder region 02/10/2013  . Type II diabetes mellitus 02/24/2012  . Hypertension 02/24/2012  . Asthma 02/24/2012    Past Medical History  Diagnosis Date  . Cataract   . Glaucoma   . Diabetes mellitus     No past surgical history on file.  History  Substance Use Topics  . Smoking status: Current Every Day Smoker -- 0.80 packs/day    Types: Cigarettes  . Smokeless tobacco: Not on file  . Alcohol Use: Not on file    Family History  Problem Relation Age of Onset  . Cancer Father     No Known Allergies  Medication list has been reviewed and updated.  Current Outpatient Prescriptions on File Prior to Visit  Medication Sig Dispense Refill  . aspirin 81 MG tablet Take 81 mg by mouth daily.      . carvedilol (COREG) 6.25 MG tablet Take 6.25 mg by mouth 2 (two) times daily with a meal.      . glipiZIDE (GLUCOTROL XL) 5 MG 24 hr tablet TAKE 1  TABLET BY MOUTH EVERY DAY  90 tablet  0  . cyclobenzaprine (FLEXERIL) 10 MG tablet Take 1 tablet (10 mg total) by mouth 2 (two) times daily as needed for muscle spasms.  30 tablet  0  . hydrochlorothiazide (MICROZIDE) 12.5 MG capsule TAKE ONE CAPSULE BY MOUTH EVERY DAY  90 capsule  3  . Lancet Devices (AUTO-LANCETS) MISC 100 Units by Does not apply route every morning.  100 each  12  . losartan (COZAAR) 50 MG tablet TAKE 1 TABLET BY MOUTH EVERY DAY  90 tablet  3  . metoprolol succinate (TOPROL-XL) 50 MG 24 hr tablet Take 1 tablet (50 mg total) by mouth daily. Take with or immediately following a meal.  90 tablet  3  . traMADol (ULTRAM) 50 MG tablet Take 1 tablet (50 mg total) by mouth every 8 (eight) hours as needed for pain.  15 tablet  0   No current facility-administered medications on file prior to visit.    Review of Systems:  As per HPI- otherwise negative.   Physical Examination: Filed Vitals:   09/12/14 0933  BP: 100/64  Pulse: 96  Temp: 97.8 F (36.6 C)  Resp: 16  Filed Vitals:   09/12/14 0933  Height: 5\' 10"  (1.778 m)  Weight: 180 lb 3.2 oz (81.738 kg)   Body mass index is 25.86 kg/(m^2). Ideal Body Weight: Weight in (lb) to have BMI = 25: 173.9  GEN: WDWN, NAD, Non-toxic, A & O x 3, looks well but is pale due to chronic anemia  HEENT: Atraumatic, Normocephalic. Neck supple. No masses, No LAD. Ears and Nose: No external deformity. CV: RRR, No M/G/R. No JVD. No thrill. No extra heart sounds. PULM: CTA B, no wheezes, crackles, rhonchi. No retractions. No resp. distress. No accessory muscle use. ABD: S, NT, ND, +BS. No rebound. No HSM. EXTR: No c/c/e NEURO Normal gait.  PSYCH: Normally interactive. Conversant. Not depressed or anxious appearing.  Calm demeanor.    Assessment and Plan: Type II or unspecified type diabetes mellitus with unspecified complication, uncontrolled - Plan: Lipid panel  Anemia of chronic disease  His BP is running on the low side today.   Will decrease his losartan to 25 mg a day.  Await lipids and follow-up with him.  OW his labs are being followed by Dr. Moshe Cipro,    Signed Lamar Blinks, MD  Fax [facility] to [facility] with Dr. Blanchard Mane- ok to cut losartan to 25 mg

## 2015-01-16 ENCOUNTER — Encounter: Payer: Self-pay | Admitting: Family Medicine

## 2015-04-07 ENCOUNTER — Encounter: Payer: Self-pay | Admitting: Family Medicine

## 2015-04-07 DIAGNOSIS — I739 Peripheral vascular disease, unspecified: Secondary | ICD-10-CM | POA: Insufficient documentation

## 2015-07-18 ENCOUNTER — Encounter: Payer: Self-pay | Admitting: *Deleted

## 2015-08-21 ENCOUNTER — Telehealth: Payer: Self-pay | Admitting: *Deleted

## 2015-08-21 NOTE — Telephone Encounter (Signed)
Patient phoned in response to my letter - wished to schedule CPE and to state Lamar Blinks, MD as his PCP.

## 2015-08-25 ENCOUNTER — Encounter: Payer: Self-pay | Admitting: Family Medicine

## 2015-08-25 DIAGNOSIS — E11319 Type 2 diabetes mellitus with unspecified diabetic retinopathy without macular edema: Secondary | ICD-10-CM | POA: Insufficient documentation

## 2015-09-01 LAB — HEMOGLOBIN A1C: Hgb A1c MFr Bld: 11.8 % — AB (ref 4.0–6.0)

## 2015-10-25 ENCOUNTER — Encounter: Payer: Self-pay | Admitting: Family Medicine

## 2015-11-08 ENCOUNTER — Encounter: Payer: Self-pay | Admitting: Family Medicine

## 2015-11-08 ENCOUNTER — Ambulatory Visit (INDEPENDENT_AMBULATORY_CARE_PROVIDER_SITE_OTHER): Payer: 59 | Admitting: Family Medicine

## 2015-11-08 VITALS — BP 120/70 | HR 93 | Temp 98.3°F | Resp 16 | Ht 69.5 in | Wt 183.8 lb

## 2015-11-08 DIAGNOSIS — Z8619 Personal history of other infectious and parasitic diseases: Secondary | ICD-10-CM

## 2015-11-08 DIAGNOSIS — Z0000 Encounter for general adult medical examination without abnormal findings: Secondary | ICD-10-CM | POA: Diagnosis not present

## 2015-11-08 DIAGNOSIS — E1165 Type 2 diabetes mellitus with hyperglycemia: Secondary | ICD-10-CM

## 2015-11-08 DIAGNOSIS — Z125 Encounter for screening for malignant neoplasm of prostate: Secondary | ICD-10-CM

## 2015-11-08 DIAGNOSIS — E1122 Type 2 diabetes mellitus with diabetic chronic kidney disease: Secondary | ICD-10-CM | POA: Diagnosis not present

## 2015-11-08 DIAGNOSIS — E785 Hyperlipidemia, unspecified: Secondary | ICD-10-CM

## 2015-11-08 LAB — HEMOGLOBIN A1C
Hgb A1c MFr Bld: 10.7 % — ABNORMAL HIGH (ref <5.7)
Mean Plasma Glucose: 260 mg/dL — ABNORMAL HIGH (ref <117)

## 2015-11-08 NOTE — Patient Instructions (Addendum)
It was good to see you today- I will be in touch with your labs asap We will recheck your A1c and then decide how best to control your diabetes. Certainly losing even 5-10 lbs will help control your sugars.    I know that you are not keen on the idea of injectable medication.  However please check out Trulicity online; it may be a good medication for you, it is not insulin, it is used once a week and it is easy to learn

## 2015-11-08 NOTE — Progress Notes (Addendum)
[facility] and [facility] [address], [place] [address] [phone]  Date:  11/08/2015   Name:  Duane Hall   DOB:  [birth year]   MRN:  [id number]  PCP:  Lamar Blinks, MD    Chief Complaint: Annual Exam   History of Present Illness:  Duane Hall is a 59 y.o. very pleasant male patient who presents with the following:  Here today for a CPE History of HTN, diabetes I have not seen him in a year or more, but his last A1c had gone up dramatically- checked recently by his nephrologist as below  He had fasting labs this am  He was treated last year for his hep C; he cannot see his hepatogist any longer as they are out of network.  He is not sure if he was cured or not Admits that he has been eating too many sweets. Has gained some weight  Most recent creat 1.9 per [facility]  Lab Results  Component Value Date   HGBA1C 11.8* 09/01/2015     Patient Active Problem List   Diagnosis Date Noted  . Diabetic retinopathy ([place]) 08/25/2015  . Claudication ([place]) 04/07/2015  . Anemia of chronic disease 09/12/2014  . Chronic renal impairment 08/03/2014  . Hepatitis C 10/15/2013  . Pain in joint, shoulder region 02/10/2013  . Type II diabetes mellitus ([place]) 02/24/2012  . Hypertension 02/24/2012  . Asthma 02/24/2012    Past Medical History  Diagnosis Date  . Cataract   . Glaucoma   . Diabetes mellitus   . Asthma   . Cancer [facility])     History reviewed. No pertinent past surgical history.  Social History  Substance Use Topics  . Smoking status: Current Every Day Smoker -- 0.80 packs/day    Types: Cigarettes  . Smokeless tobacco: None  . Alcohol Use: No    Family History  Problem Relation Age of Onset  . Cancer Father     No Known Allergies  Medication list has been reviewed and updated.  Current Outpatient Prescriptions on File Prior to Visit  Medication Sig Dispense Refill  . aspirin 81 MG tablet Take 81 mg by mouth daily.    .  carvedilol (COREG) 6.25 MG tablet Take 6.25 mg by mouth 2 (two) times daily with a meal.    . glipiZIDE (GLUCOTROL XL) 10 MG 24 hr tablet Take 10 mg by mouth daily with breakfast.    . atorvastatin (LIPITOR) 20 MG tablet Take 20 mg by mouth daily.    . cyclobenzaprine (FLEXERIL) 10 MG tablet Take 1 tablet (10 mg total) by mouth 2 (two) times daily as needed for muscle spasms. (Patient not taking: Reported on 11/08/2015) 30 tablet 0  . Lancet Devices (AUTO-LANCETS) MISC 100 Units by Does not apply route every morning. (Patient not taking: Reported on 11/08/2015) 100 each 12  . losartan (COZAAR) 50 MG tablet TAKE 1 TABLET BY MOUTH EVERY DAY (Patient not taking: Reported on 11/08/2015) 90 tablet 3  . traMADol (ULTRAM) 50 MG tablet Take 1 tablet (50 mg total) by mouth every 8 (eight) hours as needed for pain. (Patient not taking: Reported on 11/08/2015) 15 tablet 0   No current facility-administered medications on file prior to visit.    Review of Systems:  As per HPI- otherwise negative.   Physical Examination: Filed Vitals:   11/08/15 1452  BP: 120/70  Pulse: 93  Temp: 98.3 F (36.8 C)  Resp: 16   Filed Vitals:  11/08/15 1452  Height: 5' 9.5" (1.765 m)  Weight: 183 lb 12.8 oz (83.371 kg)   Body mass index is 26.76 kg/(m^2). Ideal Body Weight: Weight in (lb) to have BMI = 25: 171.4  GEN: WDWN, NAD, Non-toxic, A & O x 3, overweight HEENT: Atraumatic, Normocephalic. Neck supple. No masses, No LAD.  Bilateral TM wnl, oropharynx normal.  PEERL,EOMI.   Ears and Nose: No external deformity. CV: RRR, No M/G/R. No JVD. No thrill. No extra heart sounds. PULM: CTA B, no wheezes, crackles, rhonchi. No retractions. No resp. distress. No accessory muscle use. ABD: S, NT, ND. No rebound. No HSM. EXTR: No c/c/e NEURO Normal gait.  PSYCH: Normally interactive. Conversant. Not depressed or anxious appearing.  Calm demeanor.   No Gu exam- he declined gown  Wt Readings from Last 3 Encounters:   11/08/15 183 lb 12.8 oz (83.371 kg)  09/12/14 180 lb 3.2 oz (81.738 kg)  09/07/13 172 lb (78.019 kg)    Assessment and Plan:  Physical exam  History of hepatitis C - Plan: Hepatitis C antibody  Uncontrolled type 2 diabetes mellitus with chronic kidney disease, without long-term current use of insulin, unspecified CKD stage ([place]) - Plan: Hemoglobin A1c  Hyperlipidemia - Plan: Lipid panel  Special screening examination for neoplasm of prostate - Plan: PSA  Check hep C levels to him today Other labs pending as above Recent A1c much different than his usual- check this again.  If still high consider adding another med (? Januvia)- cannot use metformin due to his renal function  Will plan further follow- up pending labs. He declines recommended immunizations today  Results for orders placed or performed in visit on 11/08/15  Hepatitis C antibody  Result Value Ref Range   HCV Ab REACTIVE (A) NEGATIVE  Hemoglobin A1c  Result Value Ref Range   Hgb A1c MFr Bld 10.7 (H) <5.7 %   Mean Plasma Glucose 260 (H) <117 mg/dL  Lipid panel  Result Value Ref Range   Cholesterol 102 (L) 125 - 200 mg/dL   Triglycerides 86 <150 mg/dL   HDL 29 (L) >=40 mg/dL   Total CHOL/HDL Ratio 3.5 <=5.0 Ratio   VLDL 17 <30 mg/dL   LDL Cholesterol 56 <130 mg/dL  PSA  Result Value Ref Range   PSA 2.77 <=4.00 ng/mL  Hepatitis C RNA quantitative  Result Value Ref Range   HCV Quantitative  <15 IU/mL   HCV Quantitative Log  <1.18 log 10   Reviewed most recent labs from [facility]- Creat clearance is 92ml/min. Would like to add januvia to his regimen- limit dose to 50 mg due to his renal issues.  Called and left detailed message on his cell phone.  Will rx januvia 50 mg.  Plan further follow-up with the rest of his labs   [place], MD  Called 11/15 because I received the rest of his labs- hep C viral load negative.  Called and LMOM that I will be mailing a letter

## 2015-11-09 LAB — PSA: PSA: 2.77 ng/mL (ref <=4.00)

## 2015-11-09 LAB — LIPID PANEL
CHOL/HDL RATIO: 3.5 ratio (ref <=5.0)
CHOLESTEROL: 102 mg/dL — AB (ref 125–200)
HDL: 29 mg/dL — ABNORMAL LOW (ref >=40)
LDL CALC: 56 mg/dL (ref <130)
Triglycerides: 86 mg/dL (ref <150)
VLDL: 17 mg/dL (ref <30)

## 2015-11-09 LAB — HEPATITIS C ANTIBODY: HCV Ab: REACTIVE — AB

## 2015-11-10 MED ORDER — SITAGLIPTIN PHOSPHATE 50 MG PO TABS: 50.0000 mg | ORAL_TABLET | Freq: Every day | ORAL | Status: DC

## 2015-11-10 NOTE — Addendum Note (Signed)
Addended by: Lamar Blinks C on: 11/10/2015 04:46 PM   Modules accepted: Orders, Medications

## 2015-11-14 ENCOUNTER — Encounter: Payer: Self-pay | Admitting: Family Medicine

## 2015-11-14 LAB — HEPATITIS C RNA QUANTITATIVE: HCV QUANT: NOT DETECTED [IU]/mL (ref <15)

## 2016-01-19 ENCOUNTER — Encounter: Payer: Self-pay | Admitting: Family Medicine

## 2016-01-24 ENCOUNTER — Encounter: Payer: Self-pay | Admitting: Family Medicine

## 2016-08-26 ENCOUNTER — Ambulatory Visit: Payer: Self-pay | Admitting: Internal Medicine

## 2016-09-09 ENCOUNTER — Ambulatory Visit: Payer: Self-pay | Admitting: Internal Medicine

## 2016-10-22 ENCOUNTER — Ambulatory Visit: Payer: Self-pay | Admitting: Internal Medicine

## 2016-12-16 ENCOUNTER — Ambulatory Visit: Payer: Self-pay | Admitting: Internal Medicine

## 2018-10-22 ENCOUNTER — Encounter (HOSPITAL_COMMUNITY): Payer: Self-pay

## 2018-10-22 ENCOUNTER — Emergency Department (HOSPITAL_COMMUNITY): Payer: PRIVATE HEALTH INSURANCE

## 2018-10-22 ENCOUNTER — Emergency Department (HOSPITAL_COMMUNITY)
Admission: EM | Admit: 2018-10-22 | Discharge: 2018-10-23 | Disposition: A | Discharge status: Home / Self Care | Payer: PRIVATE HEALTH INSURANCE | Attending: Emergency Medicine | Admitting: Emergency Medicine

## 2018-10-22 DIAGNOSIS — F17210 Nicotine dependence, cigarettes, uncomplicated: Secondary | ICD-10-CM | POA: Diagnosis not present

## 2018-10-22 DIAGNOSIS — E871 Hypo-osmolality and hyponatremia: Secondary | ICD-10-CM | POA: Insufficient documentation

## 2018-10-22 DIAGNOSIS — Z7984 Long term (current) use of oral hypoglycemic drugs: Secondary | ICD-10-CM | POA: Diagnosis not present

## 2018-10-22 DIAGNOSIS — E1165 Type 2 diabetes mellitus with hyperglycemia: Secondary | ICD-10-CM | POA: Diagnosis not present

## 2018-10-22 DIAGNOSIS — Z79899 Other long term (current) drug therapy: Secondary | ICD-10-CM | POA: Diagnosis not present

## 2018-10-22 DIAGNOSIS — R109 Unspecified abdominal pain: Secondary | ICD-10-CM | POA: Insufficient documentation

## 2018-10-22 DIAGNOSIS — R531 Weakness: Secondary | ICD-10-CM | POA: Diagnosis present

## 2018-10-22 DIAGNOSIS — J45909 Unspecified asthma, uncomplicated: Secondary | ICD-10-CM | POA: Insufficient documentation

## 2018-10-22 DIAGNOSIS — R5383 Other fatigue: Secondary | ICD-10-CM | POA: Insufficient documentation

## 2018-10-22 DIAGNOSIS — J189 Pneumonia, unspecified organism: Secondary | ICD-10-CM | POA: Insufficient documentation

## 2018-10-22 DIAGNOSIS — I10 Essential (primary) hypertension: Secondary | ICD-10-CM | POA: Diagnosis not present

## 2018-10-22 DIAGNOSIS — R739 Hyperglycemia, unspecified: Secondary | ICD-10-CM

## 2018-10-22 LAB — CBC WITH DIFFERENTIAL/PLATELET
Abs Immature Granulocytes: 0.05 10*3/uL (ref 0.00–0.07)
BASOS ABS: 0 10*3/uL (ref 0.0–0.1)
Basophils Relative: 0 %
EOS ABS: 0 10*3/uL (ref 0.0–0.5)
Eosinophils Relative: 0 %
HEMATOCRIT: 31.5 % — AB (ref 39.0–52.0)
Hemoglobin: 11.1 g/dL — ABNORMAL LOW (ref 13.0–17.0)
Immature Granulocytes: 1 %
LYMPHS ABS: 1.6 10*3/uL (ref 0.7–4.0)
LYMPHS PCT: 16 %
MCH: 29.7 pg (ref 26.0–34.0)
MCHC: 35.2 g/dL (ref 30.0–36.0)
MCV: 84.2 fL (ref 80.0–100.0)
MONO ABS: 0.6 10*3/uL (ref 0.1–1.0)
MONOS PCT: 6 %
NRBC: 0 % (ref 0.0–0.2)
Neutro Abs: 7.7 10*3/uL (ref 1.7–7.7)
Neutrophils Relative %: 77 %
Platelets: 207 10*3/uL (ref 150–400)
RBC: 3.74 MIL/uL — ABNORMAL LOW (ref 4.22–5.81)
RDW: 12.1 % (ref 11.5–15.5)
WBC: 10 10*3/uL (ref 4.0–10.5)

## 2018-10-22 LAB — COMPREHENSIVE METABOLIC PANEL
ALBUMIN: 3.3 g/dL — AB (ref 3.5–5.0)
ALK PHOS: 75 U/L (ref 38–126)
ALT: 9 U/L (ref 0–44)
ANION GAP: 8 (ref 5–15)
AST: 11 U/L — ABNORMAL LOW (ref 15–41)
BUN: 30 mg/dL — AB (ref 6–20)
CO2: 29 mmol/L (ref 22–32)
Calcium: 9.7 mg/dL (ref 8.9–10.3)
Chloride: 92 mmol/L — ABNORMAL LOW (ref 98–111)
Creatinine, Ser: 2.09 mg/dL — ABNORMAL HIGH (ref 0.61–1.24)
GFR calc Af Amer: 38 mL/min — ABNORMAL LOW (ref >60)
GFR, EST NON AFRICAN AMERICAN: 33 mL/min — AB (ref >60)
GLUCOSE: 475 mg/dL — AB (ref 70–99)
Potassium: 4.1 mmol/L (ref 3.5–5.1)
Sodium: 129 mmol/L — ABNORMAL LOW (ref 135–145)
Total Bilirubin: 0.8 mg/dL (ref 0.3–1.2)
Total Protein: 6 g/dL — ABNORMAL LOW (ref 6.5–8.1)

## 2018-10-22 LAB — CBG MONITORING, ED: GLUCOSE-CAPILLARY: 357 mg/dL — AB (ref 70–99)

## 2018-10-22 LAB — URINALYSIS, ROUTINE W REFLEX MICROSCOPIC
BACTERIA UA: NONE SEEN
BILIRUBIN URINE: NEGATIVE
Glucose, UA: >=500 mg/dL — AB
Ketones, ur: 5 mg/dL — AB
Leukocytes, UA: NEGATIVE
Nitrite: NEGATIVE
Protein, ur: 100 mg/dL — AB
Specific Gravity, Urine: 1.02 (ref 1.005–1.030)
pH: 5 (ref 5.0–8.0)

## 2018-10-22 LAB — LIPASE, BLOOD: Lipase: 24 U/L (ref 11–51)

## 2018-10-22 IMAGING — DX DG CHEST 2V
2 series · 2 of 2 positions shown · non-contrast
Comparison: [DATE] chest radiograph

CLINICAL DATA: 62 y/o  M; worsening weakness and weight loss.

EXAM:
CHEST - 2 VIEW

[chest lat]
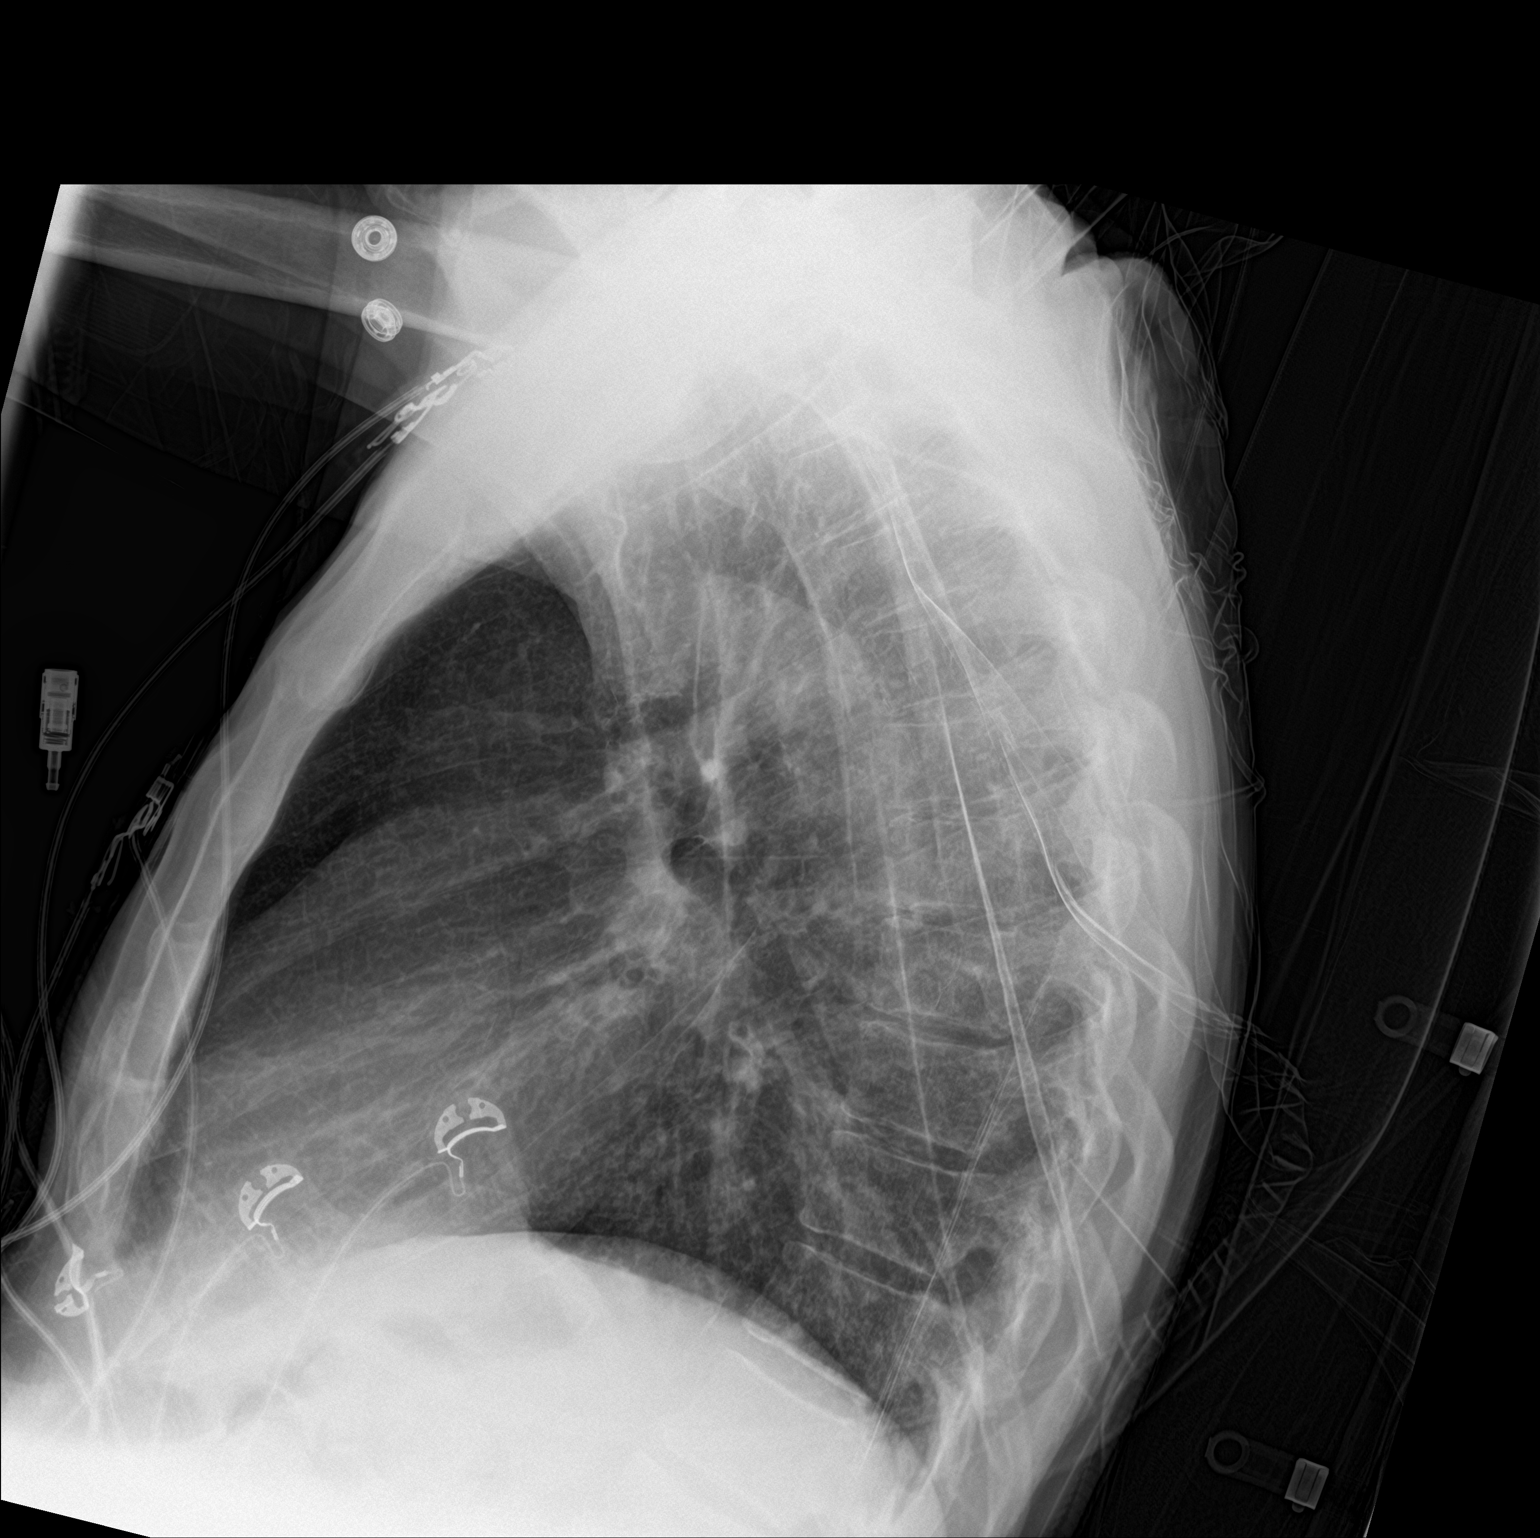

[chest ap]
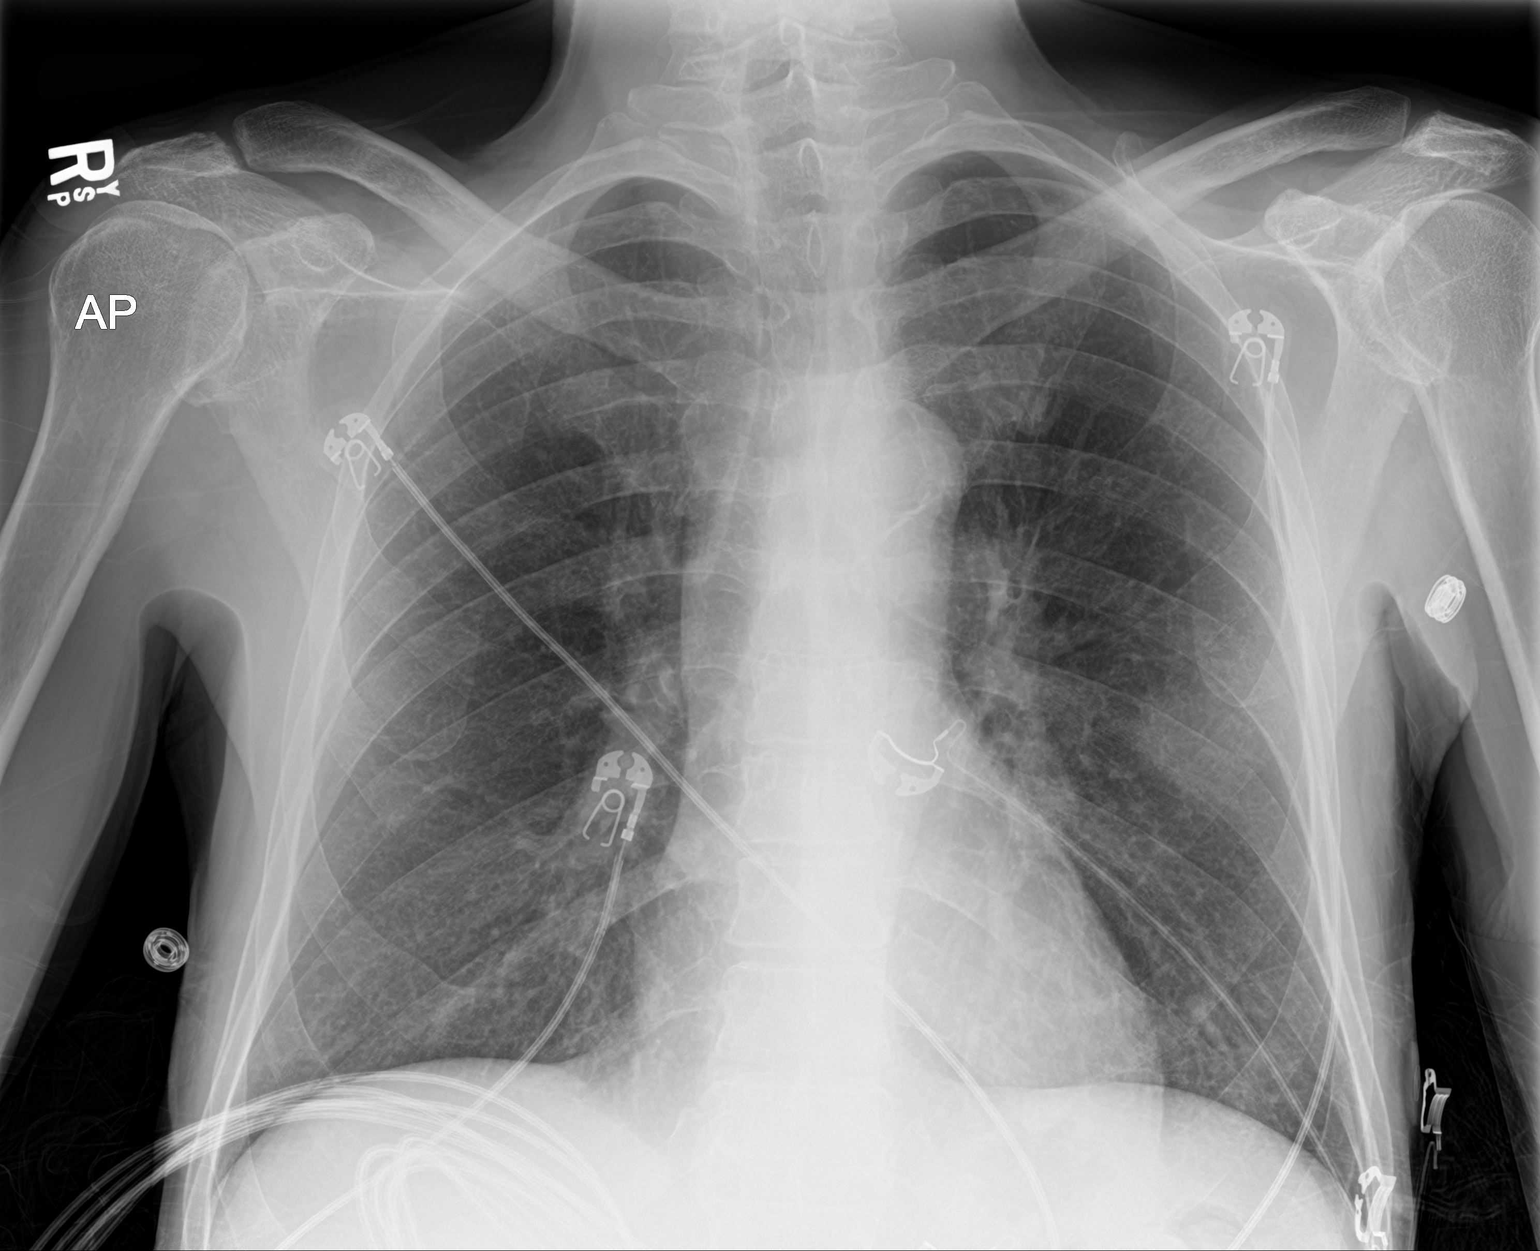

[2 of 2 positions shown; findings below may reference images not displayed]

FINDINGS: Normal cardiac silhouette. Aortic atherosclerosis with
calcification. Left mid lung zone opacity. No pleural effusion or
pneumothorax. No acute osseous abnormality is evident.
IMPRESSION: Left mid lung zone opacity may represent pneumonia. Followup PA and
lateral chest X-ray is recommended in 3-4 weeks following trial of
antibiotic therapy to ensure resolution and exclude underlying
malignancy.

By: DON LOLITO M.D.

## 2018-10-22 IMAGING — CT CT ABD-PELV W/O CM
2 of 4 series · 13 of 36 positions shown, 16 images · non-contrast
Comparison: [DATE] CT abdomen and pelvis.

CLINICAL DATA: Weakness, weight loss, no appetite.

EXAM:
CT CHEST, ABDOMEN AND PELVIS WITHOUT CONTRAST
TECHNIQUE: Multidetector CT imaging of the chest, abdomen and pelvis was
performed following the standard protocol without IV contrast.

[Series 3: cap wo 5.0 i31f 2 · axial · 0.82mm/px · z∈[+740,+1310]mm · 10 of 138 slices shown, 13 images]
[im 12/138  mediastinal]
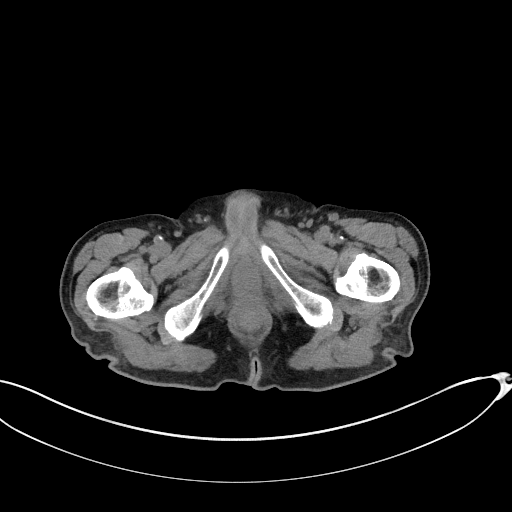
[im 12/138  lung]
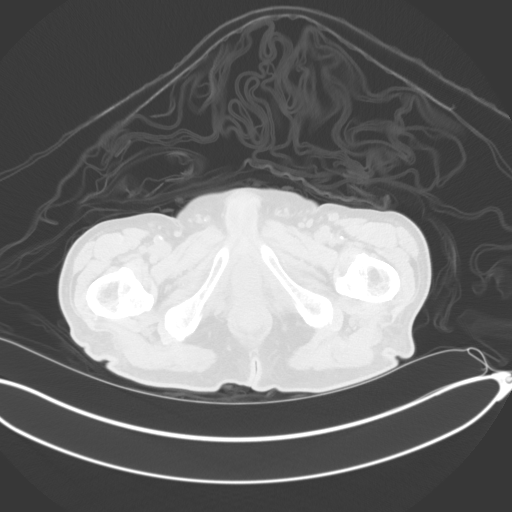
[im 23/138  lung]
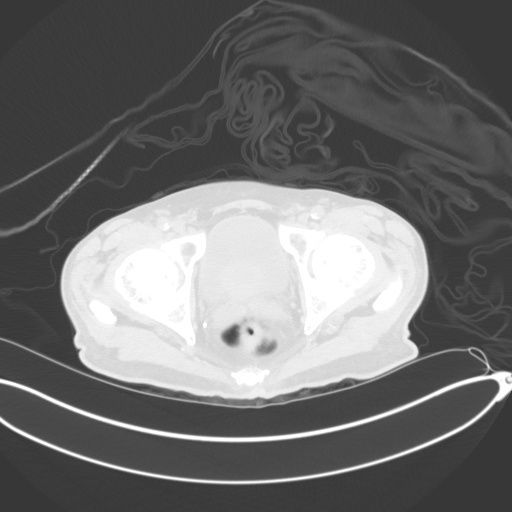
[im 35/138  lung]
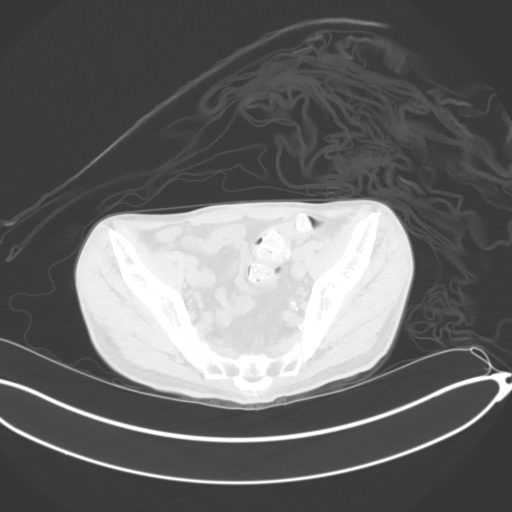
[im 46/138  lung]
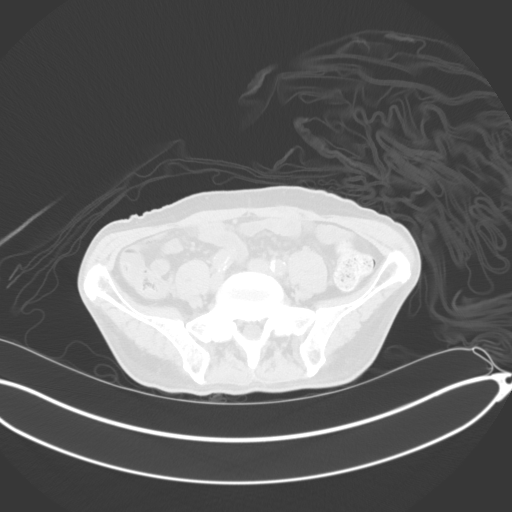
[im 58/138  mediastinal]
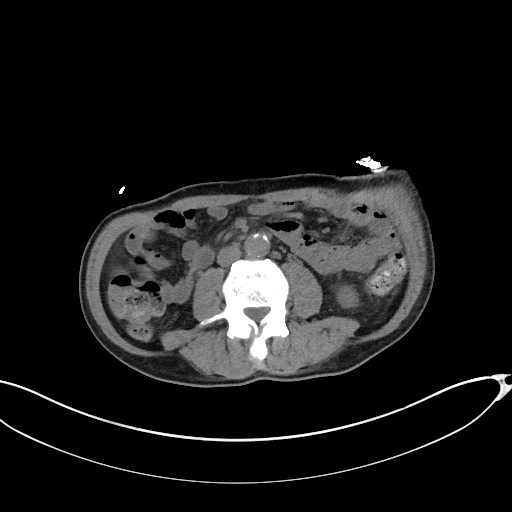
[im 58/138  lung]
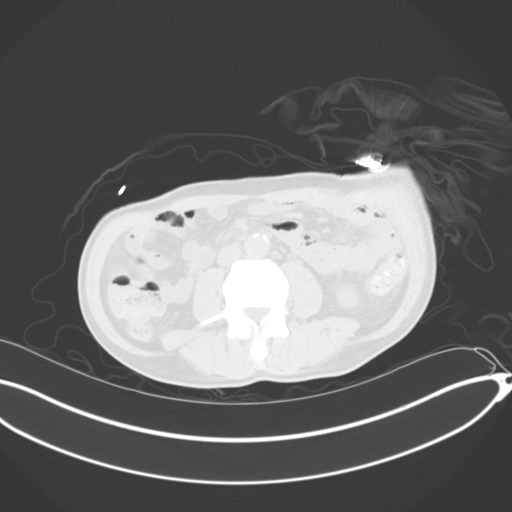
[im 80/138  lung]
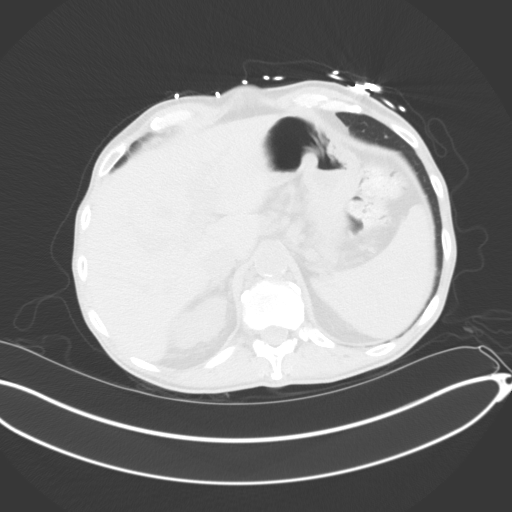
[im 92/138  lung]
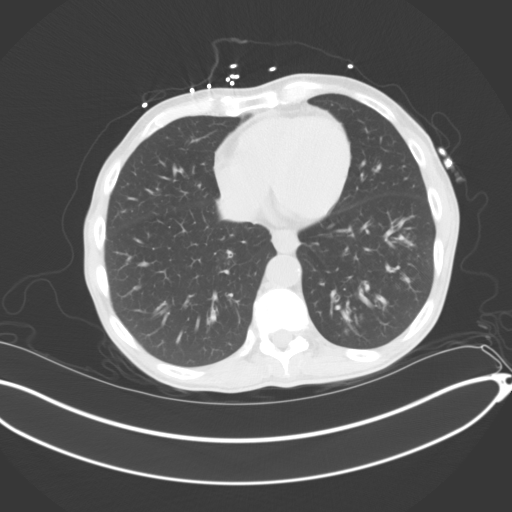
[im 103/138  lung]
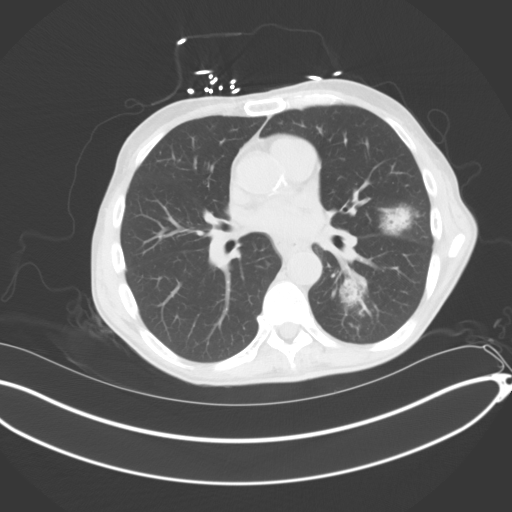
[im 115/138  mediastinal]
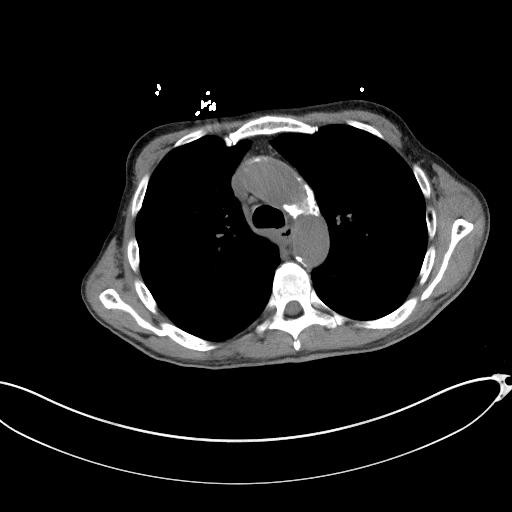
[im 115/138  lung]
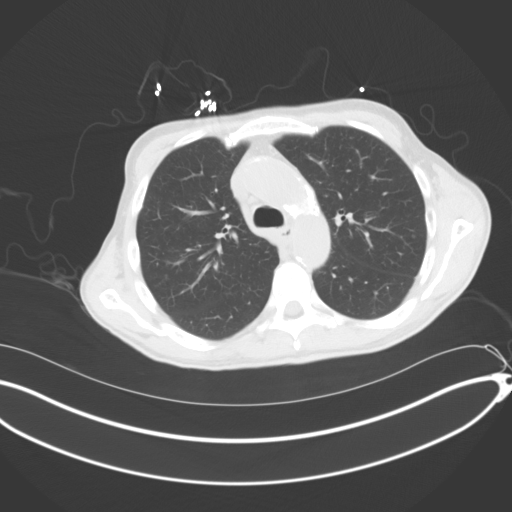
[im 126/138  lung]
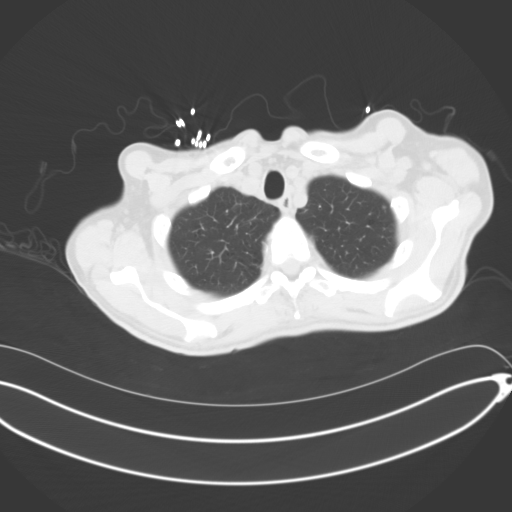

[Series 6: coronal · coronal · 0.78mm/px · 3 of 140 slices shown]
[im 28/140  lung]
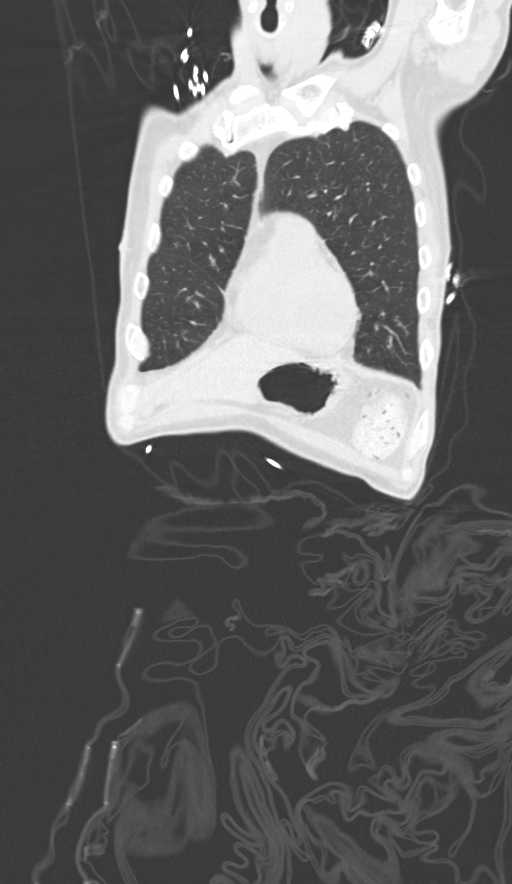
[im 56/140  lung]
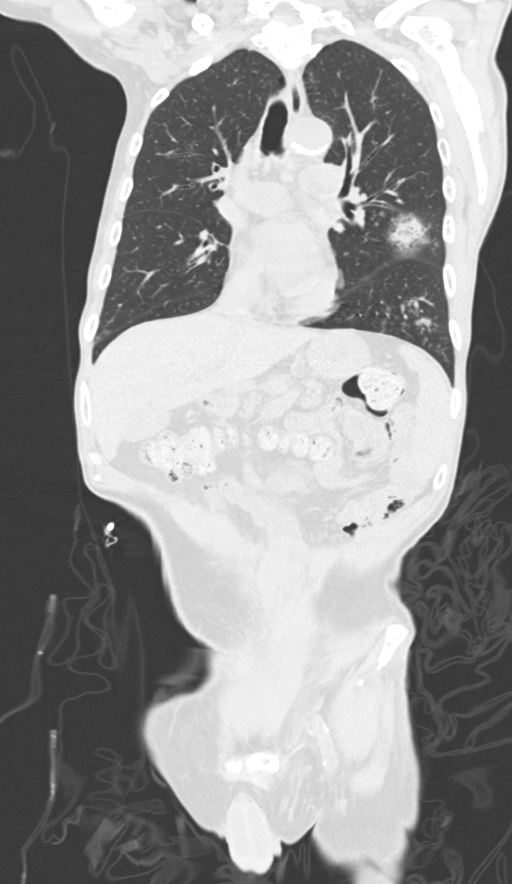
[im 84/140  lung]
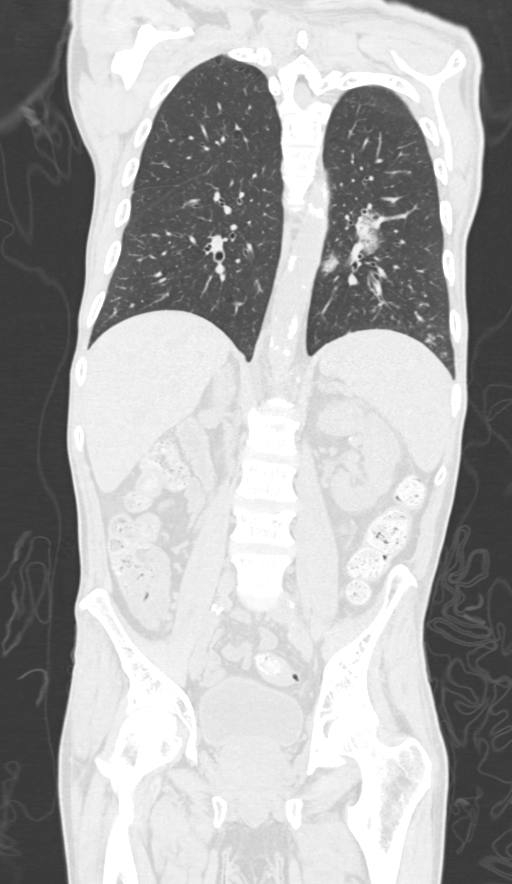

[13 of 36 positions shown; findings below may reference images not displayed]

FINDINGS: CT CHEST FINDINGS

Cardiovascular: Aortic atherosclerosis. Scattered coronary artery
calcifications. Heart is normal size. Aorta is normal caliber.

Mediastinum/Nodes: Scattered small mediastinal lymph nodes, none
pathologically enlarged. No mediastinal, hilar, or axillary
adenopathy.

Lungs/Pleura: Mixed ground-glass and solid mass like areas noted in
the left lung including posterior left upper lobe on image 92
measuring up to 2.6 cm, 2 adjacent left lower lobe masslike areas on
image 93 measuring 2.1 cm and 1.3 cm, along with smaller
predominantly ground-glass nodules in the posteromedial left lower
lobe on image 103. Extensive tree-in-bud nodular densities noted in
the left lower lobe at the left lung base and to a lesser extent in
the right lower lobe and right middle lobe. No effusions.

Musculoskeletal: No acute bony abnormality.

CT ABDOMEN PELVIS FINDINGS

Hepatobiliary: Small gallstones layering within the gallbladder. No
focal hepatic abnormality or biliary ductal dilatation.

Pancreas: No focal abnormality or ductal dilatation.

Spleen: No focal abnormality.  Normal size.

Adrenals/Urinary Tract: 2.3 cm exophytic low-density lesion off the
superior pole of the left kidney. No hydronephrosis bilaterally. No
adrenal mass. Urinary bladder unremarkable.

Stomach/Bowel: Moderate stool burden throughout the colon. Scattered
sigmoid diverticula. No active diverticulitis. Appendix is normal.
Stomach and small bowel decompressed, unremarkable.

Vascular/Lymphatic: Aortic atherosclerosis. No enlarged abdominal or
pelvic lymph nodes.

Reproductive: Prostate enlargement.

Other: No free fluid or free air.  No acute bony abnormality.

Musculoskeletal: No acute bony abnormality.
IMPRESSION: There are masslike mixed solid and subsolid areas within the
posterior left upper lobe and superior segment of the left lower
lobe. In addition, nodular tree-in-bud densities noted extensively
throughout the left lower lobe, and to a lesser extent in the right
lower lobe and right middle lobe. These findings are likely
infectious/inflammatory although the larger masslike areas are
somewhat concerning for possible malignancy. Followup chest CT is
recommended in 3-4 weeks following trial of antibiotic therapy to
ensure resolution and exclude underlying malignancy.

Coronary artery disease, aortic atherosclerosis.

Moderate stool burden in the colon.

Cholelithiasis.

Prostate enlargement.

No acute findings in the abdomen or pelvis.

## 2018-10-22 MED ORDER — SODIUM CHLORIDE 0.9 % IV BOLUS
1000.0000 mL | Freq: Once | INTRAVENOUS | Status: AC
  Administered 2018-10-22: 1000 mL via INTRAVENOUS

## 2018-10-22 MED ORDER — SODIUM CHLORIDE 0.9 % IV BOLUS
1000.0000 mL | Freq: Once | INTRAVENOUS | Status: AC
  Administered 2018-10-23: 1000 mL via INTRAVENOUS

## 2018-10-22 MED ORDER — AMOXICILLIN-POT CLAVULANATE 875-125 MG PO TABS
1.0000 | ORAL_TABLET | Freq: Once | ORAL | Status: AC
  Administered 2018-10-23: 1 via ORAL
  Filled 2018-10-22: qty 1

## 2018-10-22 MED ORDER — DOXYCYCLINE HYCLATE 100 MG PO TABS
100.0000 mg | ORAL_TABLET | Freq: Once | ORAL | Status: AC
  Administered 2018-10-23: 100 mg via ORAL
  Filled 2018-10-22: qty 1

## 2018-10-22 NOTE — ED Triage Notes (Signed)
Pt presents with weakness for years.  Pt reports over the past few weeks, it has become extreme, pt is afraid he's going to fall due to weakness.  Pt reports no appetite, is not eating, has lost 60 pounds over the past year.  Pt reports he is afraid he has prostate cancer, reports being incontinent of urine.  Pt is being treated for bronchitis recently with abx.

## 2018-10-22 NOTE — ED Provider Notes (Signed)
Patient placed in Quick Look pathway, seen and evaluated   Chief Complaint: weakness  HPI: Duane Hall is a 62 y.o. male who presents to the ED with weakness. Patient reports he has been weak for years but now is getting much worse and the last 2 weeks has been severe. Patient reports a weight loss of 60 pounds over the past 12 months. Patient reports he does not eat due to lack of appetite. Patient has been incontinent of urine and is afraid he has prostate cancer. Both parents died with cancer. Patient recently treated with antibiotics for bronchitis.  ROS: M/S: left side back pain  General: weakness  GU: urinary incontinents   Physical Exam:  BP 132/74 (BP Location: Right Arm)   Pulse (!) 104   Temp 99.4 F (37.4 C) (Oral)   Resp 16   Ht 5\' 9"  (1.753 m)   Wt 54 kg   SpO2 94%   BMI 17.57 kg/m    Gen: No distress, thin w/m  Neuro: Awake and Alert  Skin: pale   Initiation of care has begun. The patient has been counseled on the process, plan, and necessity for staying for the completion/evaluation, and the remainder of the medical screening examination    Ashley Murrain, NP 10/22/18 1513    Margette Fast, MD 10/22/18 1827

## 2018-10-22 NOTE — ED Provider Notes (Signed)
[facility] EMERGENCY DEPARTMENT Provider Note   CSN: [id number] Arrival date & time: 10/22/18  1431     History   Chief Complaint Chief Complaint  Patient presents with  . Weakness    HPI Duane Hall is a 62 y.o. male.  Duane Hall is a 62 y.o. Male with a history of diabetes, hypertension, hep C and asthma, who presents to the emergency department for evaluation of fatigue.  Patient reports that for the past several years he has felt chronically fatigued, but about a week and a half ago he contracted an upper respiratory viral infection and his fatigue seemed to significantly worsen, 2 days ago he followed up with the doctor who prescribed him Ceftin ear but patient continues to have no energy.  He also reports poor appetite and that he frequently does not feel like eating and eats a very small meals of anything.  He had some fevers at the beginning of this viral infection that have since ceased.  Reports intermittent coughing.  No chest pain or shortness of breath.  He does not have abdominal pain he does intermittently feel nauseated but has not had any vomiting.  No diarrhea, melena or hematochezia.  He denies any dysuria or urinary frequency.     Past Medical History:  Diagnosis Date  . Asthma   . Cancer ([place])   . Cataract   . Diabetes mellitus   . Glaucoma     Patient Active Problem List   Diagnosis Date Noted  . Diabetic retinopathy ([place]) 08/25/2015  . Claudication ([place]) 04/07/2015  . Anemia of chronic disease 09/12/2014  . Chronic renal impairment 08/03/2014  . Hepatitis C 10/15/2013  . Pain in joint, shoulder region 02/10/2013  . Type II diabetes mellitus ([place]) 02/24/2012  . Hypertension 02/24/2012  . Asthma 02/24/2012    History reviewed. No pertinent surgical history.      Home Medications    Prior to Admission medications   Medication Sig Start Date End Date Taking? Authorizing Provider  Ca Carbonate-Mag Hydroxide (ROLAIDS  EXTRA STRENGTH) 675-135 MG CHEW Chew 1-2 tablets by mouth as needed (for sour stomach or indigestion).    Yes [provider]  cefdinir (OMNICEF) 300 MG capsule Take 300 mg by mouth 2 (two) times daily. 10/20/18  Yes [provider]  oxymetazoline (AFRIN) 0.05 % nasal spray Place 1-2 sprays into both nostrils 2 (two) times daily as needed for congestion.   Yes [provider]  amoxicillin-clavulanate (AUGMENTIN) 875-125 MG tablet Take 1 tablet by mouth 2 (two) times daily. One po bid x 7 days 10/23/18   Jacqlyn Larsen, PA-C  atorvastatin (LIPITOR) 20 MG tablet Take 20 mg by mouth daily.    [provider]  carvedilol (COREG) 6.25 MG tablet Take 6.25 mg by mouth 2 (two) times daily with a meal.    [provider]  doxycycline (VIBRAMYCIN) 100 MG capsule Take 1 capsule (100 mg total) by mouth 2 (two) times daily. One po bid x 7 days 10/23/18   Jacqlyn Larsen, PA-C  glipiZIDE (GLUCOTROL XL) 10 MG 24 hr tablet Take 10 mg by mouth daily with breakfast.    [provider]  Lancet Devices (AUTO-LANCETS) MISC 100 Units by Does not apply route every morning. 02/24/12   Robyn Haber, MD  losartan (COZAAR) 50 MG tablet TAKE 1 TABLET BY MOUTH EVERY DAY Patient not taking: Reported on 10/22/2018 11/14/13   Theda Sers, PA-C  pregabalin [facility])  75 MG capsule Take 75 mg by mouth 2 (two) times daily.    [provider]  sitaGLIPtin (JANUVIA) 50 MG tablet Take 1 tablet (50 mg total) by mouth daily. Patient not taking: Reported on 10/22/2018 11/10/15   Copland, Gay Filler, MD  traMADol (ULTRAM) 50 MG tablet Take 1 tablet (50 mg total) by mouth every 8 (eight) hours as needed for pain. Patient not taking: Reported on 10/22/2018 09/07/13   Copland, Gay Filler, MD    Family History Family History  Problem Relation Age of Onset  . Cancer Father     Social History Social History   Tobacco Use  . Smoking status: Current Every Day Smoker     Packs/day: 0.80    Types: Cigarettes  . Smokeless tobacco: Never Used  Substance Use Topics  . Alcohol use: No    Alcohol/week: 0.0 standard drinks  . Drug use: No     Allergies   Patient has no known allergies.   Review of Systems Review of Systems  Constitutional: Positive for appetite change and fatigue. Negative for chills and fever.  HENT: Positive for congestion. Negative for rhinorrhea and sore throat.   Eyes: Negative for visual disturbance.  Respiratory: Positive for cough.   Cardiovascular: Negative for chest pain.  Gastrointestinal: Negative for abdominal pain, nausea and vomiting.  Genitourinary: Negative for dysuria and flank pain.  Musculoskeletal: Negative for arthralgias, joint swelling and myalgias.  Skin: Negative for color change and rash.  Neurological: Positive for weakness (Generalized) and headaches. Negative for dizziness, syncope and light-headedness.     Physical Exam Updated Vital Signs BP (!) 151/79   Pulse 86   Temp 98.9 F (37.2 C) (Oral)   Resp 17   Ht 5\' 9"  (1.753 m)   Wt 54 kg   SpO2 98%   BMI 17.57 kg/m   Physical Exam  Constitutional: He is oriented to person, place, and time. He appears well-developed and well-nourished. No distress.  Chronically cachectic appearing but in no acute distress  HENT:  Head: Normocephalic and atraumatic.  Mouth/Throat: Oropharynx is clear and moist.  Eyes: Right eye exhibits no discharge. Left eye exhibits no discharge.  Neck: Neck supple.  Cardiovascular: Normal rate, regular rhythm, normal heart sounds and intact distal pulses. Exam reveals no gallop and no friction rub.  No murmur heard. Pulmonary/Chest: Effort normal and breath sounds normal. No respiratory distress.  Respirations equal and unlabored, patient able to speak in full sentences, lungs clear to auscultation bilaterally  Abdominal: Soft. Bowel sounds are normal. He exhibits no distension and no mass. There is no tenderness. There is  no guarding.  Abdomen soft, nondistended, nontender to palpation in all quadrants without guarding or peritoneal signs  Musculoskeletal: He exhibits no edema or deformity.  Neurological: He is alert and oriented to person, place, and time. Coordination normal.  Speech is clear, able to follow commands CN III-XII intact Normal strength in upper and lower extremities bilaterally including dorsiflexion and plantar flexion, strong and equal grip strength Sensation normal to light and sharp touch Moves extremities without ataxia, coordination intact Normal finger to nose and rapid alternating movements No pronator drift  Skin: Skin is warm and dry. Capillary refill takes less than 2 seconds. He is not diaphoretic.  Psychiatric: He has a normal mood and affect. His behavior is normal.  Nursing note and vitals reviewed.    ED Treatments / Results  Labs (all labs ordered are listed, but only abnormal results are displayed) Labs Reviewed  CBC WITH DIFFERENTIAL/PLATELET - Abnormal; Notable for the following components:      Result Value   RBC 3.74 (*)    Hemoglobin 11.1 (*)    HCT 31.5 (*)    All other components within normal limits  COMPREHENSIVE METABOLIC PANEL - Abnormal; Notable for the following components:   Sodium 129 (*)    Chloride 92 (*)    Glucose, Bld 475 (*)    BUN 30 (*)    Creatinine, Ser 2.09 (*)    Total Protein 6.0 (*)    Albumin 3.3 (*)    AST 11 (*)    GFR calc non Af Amer 33 (*)    GFR calc Af Amer 38 (*)    All other components within normal limits  URINALYSIS, ROUTINE W REFLEX MICROSCOPIC - Abnormal; Notable for the following components:   APPearance HAZY (*)    Glucose, UA >=500 (*)    Hgb urine dipstick SMALL (*)    Ketones, ur 5 (*)    Protein, ur 100 (*)    All other components within normal limits  CBG MONITORING, ED - Abnormal; Notable for the following components:   Glucose-Capillary 357 (*)    All other components within normal limits  CBG  MONITORING, ED - Abnormal; Notable for the following components:   Glucose-Capillary 405 (*)    All other components within normal limits  LIPASE, BLOOD    EKG EKG Interpretation  Date/Time:  Thursday October 22 2018 21:24:01 EDT Ventricular Rate:  87 PR Interval:    QRS Duration: 88 QT Interval:  390 QTC Calculation: 470 R Axis:   50 Text Interpretation:  Sinus rhythm Minimal ST depression, lateral leads ST elevation, consider anterior injury Baseline wander in lead(s) V6 No old tracing to compare Confirmed by Isla Pence [phone]) on 10/22/2018 9:38:45 PM   Radiology Ct Abdomen Pelvis Wo Contrast  Result Date: 10/22/2018 CLINICAL DATA:  Weakness, weight loss, no appetite. EXAM: CT CHEST, ABDOMEN AND PELVIS WITHOUT CONTRAST TECHNIQUE: Multidetector CT imaging of the chest, abdomen and pelvis was performed following the standard protocol without IV contrast. COMPARISON:  03/03/2012 CT abdomen and pelvis. FINDINGS: CT CHEST FINDINGS Cardiovascular: Aortic atherosclerosis. Scattered coronary artery calcifications. Heart is normal size. Aorta is normal caliber. Mediastinum/Nodes: Scattered small mediastinal lymph nodes, none pathologically enlarged. No mediastinal, hilar, or axillary adenopathy. Lungs/Pleura: Mixed ground-glass and solid mass like areas noted in the left lung including posterior left upper lobe on image 92 measuring up to 2.6 cm, 2 adjacent left lower lobe masslike areas on image 93 measuring 2.1 cm and 1.3 cm, along with smaller predominantly ground-glass nodules in the posteromedial left lower lobe on image 103. Extensive tree-in-bud nodular densities noted in the left lower lobe at the left lung base and to a lesser extent in the right lower lobe and right middle lobe. No effusions. Musculoskeletal: No acute bony abnormality. CT ABDOMEN PELVIS FINDINGS Hepatobiliary: Small gallstones layering within the gallbladder. No focal hepatic abnormality or biliary ductal dilatation.  Pancreas: No focal abnormality or ductal dilatation. Spleen: No focal abnormality.  Normal size. Adrenals/Urinary Tract: 2.3 cm exophytic low-density lesion off the superior pole of the left kidney. No hydronephrosis bilaterally. No adrenal mass. Urinary bladder unremarkable. Stomach/Bowel: Moderate stool burden throughout the colon. Scattered sigmoid diverticula. No active diverticulitis. Appendix is normal. Stomach and small bowel decompressed, unremarkable. Vascular/Lymphatic: Aortic atherosclerosis. No enlarged abdominal or pelvic lymph nodes. Reproductive: Prostate enlargement. Other: No free fluid or free air.  No acute bony  abnormality. Musculoskeletal: No acute bony abnormality. IMPRESSION: There are masslike mixed solid and subsolid areas within the posterior left upper lobe and superior segment of the left lower lobe. In addition, nodular tree-in-bud densities noted extensively throughout the left lower lobe, and to a lesser extent in the right lower lobe and right middle lobe. These findings are likely infectious/inflammatory although the larger masslike areas are somewhat concerning for possible malignancy. Followup chest CT is recommended in 3-4 weeks following trial of antibiotic therapy to ensure resolution and exclude underlying malignancy. Coronary artery disease, aortic atherosclerosis. Moderate stool burden in the colon. Cholelithiasis. Prostate enlargement. No acute findings in the abdomen or pelvis. Electronically Signed   By: Rolm Baptise M.D.   On: 10/22/2018 23:44   Dg Chest 2 View  Result Date: 10/22/2018 CLINICAL DATA:  62 y/o  M; worsening weakness and weight loss. EXAM: CHEST - 2 VIEW COMPARISON:  03/11/2012 chest radiograph FINDINGS: Normal cardiac silhouette. Aortic atherosclerosis with calcification. Left mid lung zone opacity. No pleural effusion or pneumothorax. No acute osseous abnormality is evident. IMPRESSION: Left mid lung zone opacity may represent pneumonia. Followup PA  and lateral chest X-ray is recommended in 3-4 weeks following trial of antibiotic therapy to ensure resolution and exclude underlying malignancy. Electronically Signed   By: Kristine Garbe M.D.   On: 10/22/2018 22:33   Ct Chest Wo Contrast  Result Date: 10/22/2018 CLINICAL DATA:  Weakness, weight loss, no appetite. EXAM: CT CHEST, ABDOMEN AND PELVIS WITHOUT CONTRAST TECHNIQUE: Multidetector CT imaging of the chest, abdomen and pelvis was performed following the standard protocol without IV contrast. COMPARISON:  03/03/2012 CT abdomen and pelvis. FINDINGS: CT CHEST FINDINGS Cardiovascular: Aortic atherosclerosis. Scattered coronary artery calcifications. Heart is normal size. Aorta is normal caliber. Mediastinum/Nodes: Scattered small mediastinal lymph nodes, none pathologically enlarged. No mediastinal, hilar, or axillary adenopathy. Lungs/Pleura: Mixed ground-glass and solid mass like areas noted in the left lung including posterior left upper lobe on image 92 measuring up to 2.6 cm, 2 adjacent left lower lobe masslike areas on image 93 measuring 2.1 cm and 1.3 cm, along with smaller predominantly ground-glass nodules in the posteromedial left lower lobe on image 103. Extensive tree-in-bud nodular densities noted in the left lower lobe at the left lung base and to a lesser extent in the right lower lobe and right middle lobe. No effusions. Musculoskeletal: No acute bony abnormality. CT ABDOMEN PELVIS FINDINGS Hepatobiliary: Small gallstones layering within the gallbladder. No focal hepatic abnormality or biliary ductal dilatation. Pancreas: No focal abnormality or ductal dilatation. Spleen: No focal abnormality.  Normal size. Adrenals/Urinary Tract: 2.3 cm exophytic low-density lesion off the superior pole of the left kidney. No hydronephrosis bilaterally. No adrenal mass. Urinary bladder unremarkable. Stomach/Bowel: Moderate stool burden throughout the colon. Scattered sigmoid diverticula. No active  diverticulitis. Appendix is normal. Stomach and small bowel decompressed, unremarkable. Vascular/Lymphatic: Aortic atherosclerosis. No enlarged abdominal or pelvic lymph nodes. Reproductive: Prostate enlargement. Other: No free fluid or free air.  No acute bony abnormality. Musculoskeletal: No acute bony abnormality. IMPRESSION: There are masslike mixed solid and subsolid areas within the posterior left upper lobe and superior segment of the left lower lobe. In addition, nodular tree-in-bud densities noted extensively throughout the left lower lobe, and to a lesser extent in the right lower lobe and right middle lobe. These findings are likely infectious/inflammatory although the larger masslike areas are somewhat concerning for possible malignancy. Followup chest CT is recommended in 3-4 weeks following trial of antibiotic therapy to ensure resolution  and exclude underlying malignancy. Coronary artery disease, aortic atherosclerosis. Moderate stool burden in the colon. Cholelithiasis. Prostate enlargement. No acute findings in the abdomen or pelvis. Electronically Signed   By: Rolm Baptise M.D.   On: 10/22/2018 23:44    Procedures Procedures (including critical care time)  Medications Ordered in ED Medications  sodium chloride 0.9 % bolus 1,000 mL (0 mLs Intravenous Stopped 10/22/18 2323)  sodium chloride 0.9 % bolus 1,000 mL (0 mLs Intravenous Stopped 10/23/18 0104)  amoxicillin-clavulanate (AUGMENTIN) 875-125 MG per tablet 1 tablet (1 tablet Oral Given 10/23/18 0001)  doxycycline (VIBRA-TABS) tablet 100 mg (100 mg Oral Given 10/23/18 0001)     Initial Impression / Assessment and Plan / ED Course  I have reviewed the triage vital signs and the nursing notes.  Pertinent labs & imaging results that were available during my care of the patient were reviewed by me and considered in my medical decision making (see chart for details).  Patient presents for evaluation of chronic and worsening fatigue,  recently diagnosed with upper respiratory illness and has been on 2 days of Cefdinir.  Reports he has had fatigue for several years with this illness has become significantly worse in the past week.  No fevers, also reports poor appetite and unintentional weight loss.  No abdominal pain, nausea or vomiting.  Patient appears cachectic but is in no acute distress with normal vitals.  Has been diagnosed with multiple chronic medical conditions including hypertension and diabetes but has stopped taking all of his chronic medications for the past year.  Basic lab work ordered from triage and significant for a glucose of 475, mild hyponatremia of 129 in the setting of hyperglycemia, creatinine of 2.09 which is at baseline, no other acute electrolyte derangements requiring intervention.  No leukocytosis hemoglobin of 11.1.  Urinalysis with glucose present but no signs of infection.  Normal lipase.  Will give 1 L fluid and check EKG and chest x-ray.  EKG without concerning ischemic changes, chest x-ray shows possible infiltrate in the left midlung given prolonged respiratory illness, patient's cachexia and weight loss will get CT chest abdomen pelvis without contrast given patient's elevated creatinine to evaluate for any underlying malignancy and better characterize pneumonia.  There are masslike mixed solid and sub-solid areas within the posterior left upper lobe and superior segment of the left lower lobe as well as nodular tree-in-bud densities throughout the left lower lobe, this is favored to be infectious versus inflammatory but there is concerning possibility for malignancy, no evidence of acute intra-abdominal pathology.  Recommend treatment for pneumonia with follow-up CT in 3 to 4 weeks to assess for resolution of masslike lesions within the lung.  I discussed these findings and recommended plan with the patient we will plan to treat with doxycycline and Augmentin, blood sugar has improved to 357 after liter  and patient reports he is actually feeling hungry and like eating something which is atypical for him, provided meal and given 1 additional liter of fluid.  Sugar increase slightly to 405 after patient ate, but still feel that he is stable for discharge home as patient has not been on any anti-hyperglycemics for the past year, no signs of DKA today.  Will discharge with antibiotics and patient has been instructed to follow closely with his primary care doctor, return precautions have been discussed.  He expresses understanding and is in agreement with plan, he is stable for discharge home at this time.  Patient discussed with Dr. Gilford Raid, who saw  patient as well and agrees with plan.   Final Clinical Impressions(s) / ED Diagnoses   Final diagnoses:  Community acquired pneumonia, unspecified laterality  Fatigue, unspecified type  Hyperglycemia  Hyponatremia    ED Discharge Orders         Ordered    amoxicillin-clavulanate (AUGMENTIN) 875-125 MG tablet  2 times daily     10/23/18 0052    doxycycline (VIBRAMYCIN) 100 MG capsule  2 times daily     10/23/18 0052           Jacqlyn Larsen, PA-C 10/23/18 0151    Isla Pence, MD 10/24/18 [phone]

## 2018-10-22 NOTE — ED Notes (Signed)
Pt ambulated to bathroom, noted to have steady gait and no difficulty.

## 2018-10-22 NOTE — ED Notes (Signed)
Pt CBG was 357, notified Bobby(RN)

## 2018-10-22 NOTE — ED Notes (Signed)
Unable to locate x 1  

## 2018-10-23 LAB — CBG MONITORING, ED: Glucose-Capillary: 405 mg/dL — ABNORMAL HIGH (ref 70–99)

## 2018-10-23 MED ORDER — AMOXICILLIN-POT CLAVULANATE 875-125 MG PO TABS: 1.0000 | ORAL_TABLET | Freq: Two times a day (BID) | ORAL | 0 refills | Status: DC

## 2018-10-23 MED ORDER — DOXYCYCLINE HYCLATE 100 MG PO CAPS: 100.0000 mg | ORAL_CAPSULE | Freq: Two times a day (BID) | ORAL | 0 refills | Status: DC

## 2018-10-23 NOTE — ED Notes (Signed)
Windell Moment PA notified on pt.'s hyperglycemia , advised RN that pt.can be discharge home.

## 2018-10-23 NOTE — Discharge Instructions (Addendum)
Your CT scan shows pneumonia with some masslike features, please take antibiotics as directed for the next week and in 3 to 4 weeks she will need to follow-up with your regular doctor for repeat CT scan to make sure that pneumonia has resolved and there are no underlying masses, scan of your abdomen did not show any acute findings and your labs look good overall aside from elevated blood sugar, and slightly low sodium.  Please follow-up with your primary care doctor in 1 week to have electrolytes rechecked and in 1 month for repeat Ct.   Return to the emergency department if you develop high fevers, worsening cough, chest pain, shortness of breath or any other new or concerning symptoms.

## 2018-11-18 DIAGNOSIS — E1165 Type 2 diabetes mellitus with hyperglycemia: Secondary | ICD-10-CM | POA: Insufficient documentation

## 2018-11-18 DIAGNOSIS — E7800 Pure hypercholesterolemia, unspecified: Secondary | ICD-10-CM | POA: Insufficient documentation

## 2018-11-18 DIAGNOSIS — N401 Enlarged prostate with lower urinary tract symptoms: Secondary | ICD-10-CM | POA: Insufficient documentation

## 2018-11-19 ENCOUNTER — Other Ambulatory Visit: Payer: Self-pay | Admitting: Physician Assistant

## 2018-11-19 DIAGNOSIS — R918 Other nonspecific abnormal finding of lung field: Secondary | ICD-10-CM

## 2018-11-19 DIAGNOSIS — J189 Pneumonia, unspecified organism: Secondary | ICD-10-CM

## 2018-11-20 ENCOUNTER — Ambulatory Visit
Admission: RE | Admit: 2018-11-20 | Discharge: 2018-11-20 | Disposition: A | Discharge status: Home / Self Care | Payer: PRIVATE HEALTH INSURANCE | Source: Ambulatory Visit | Attending: Physician Assistant | Admitting: Physician Assistant

## 2018-11-20 DIAGNOSIS — R918 Other nonspecific abnormal finding of lung field: Secondary | ICD-10-CM

## 2018-11-20 DIAGNOSIS — J189 Pneumonia, unspecified organism: Secondary | ICD-10-CM

## 2018-11-20 IMAGING — CT CT CHEST W/O CM
1 of 2 series · 14 of 30 positions shown, 18 images · non-contrast
Comparison: [DATE]

CLINICAL DATA: Cough and shortness of breath. Unintentional weight
loss.

EXAM:
CT CHEST WITHOUT CONTRAST
TECHNIQUE: Multidetector CT imaging of the chest was performed following the
standard protocol without IV contrast.

[Series 2: chest w/(date) · axial · 0.66mm/px · z∈[-341,-55]mm · 14 of 167 slices shown, 18 images]
[im 12/167  mediastinal]
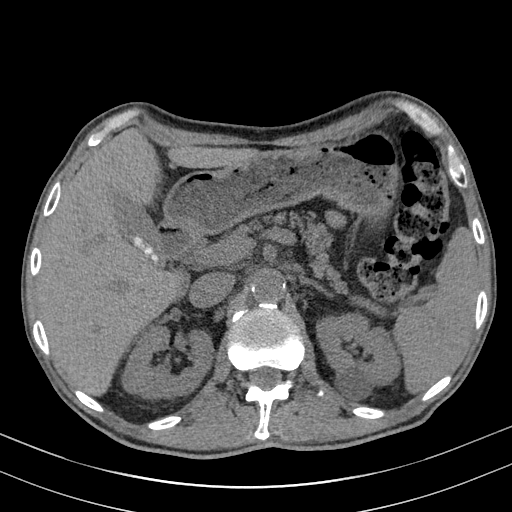
[im 12/167  lung]
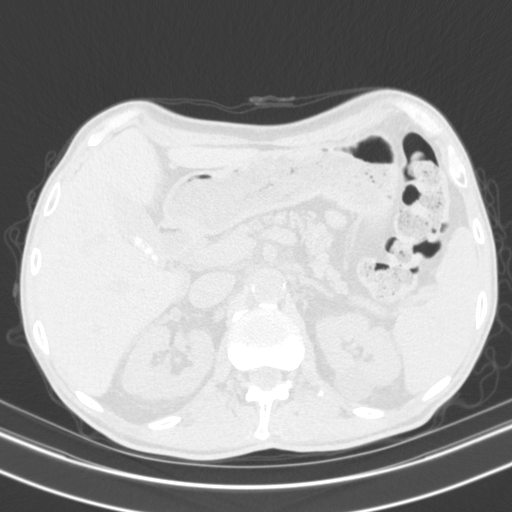
[im 24/167  lung]
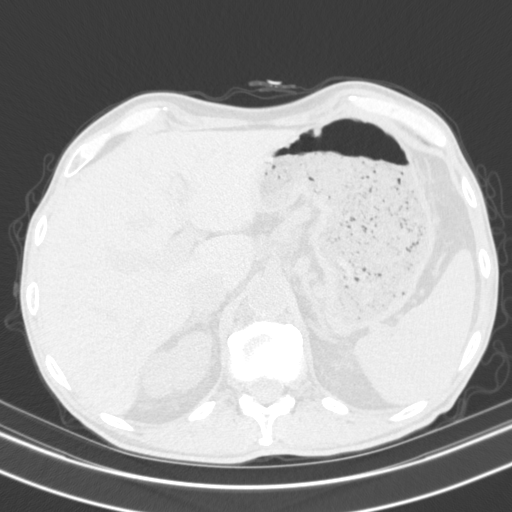
[im 36/167  lung]
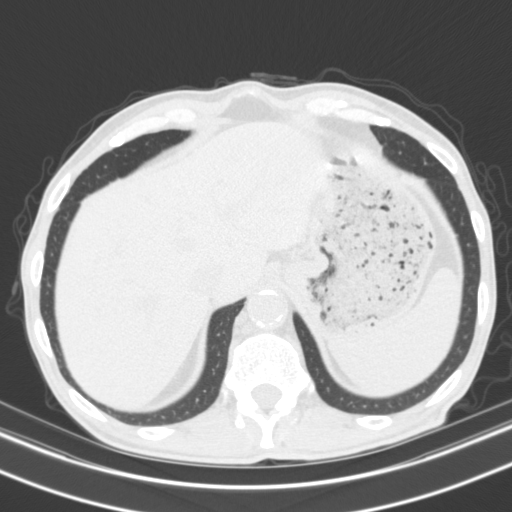
[im 48/167  lung]
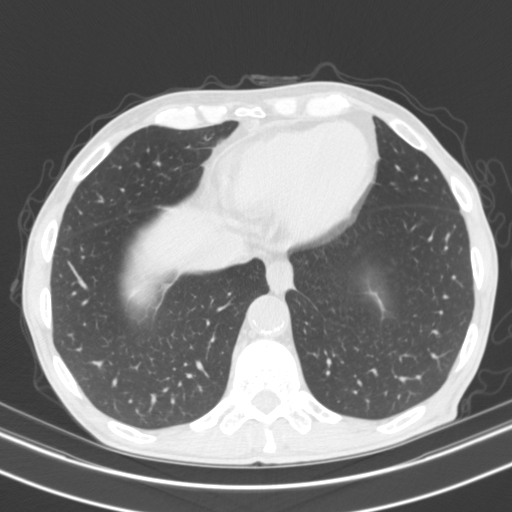
[im 60/167  mediastinal]
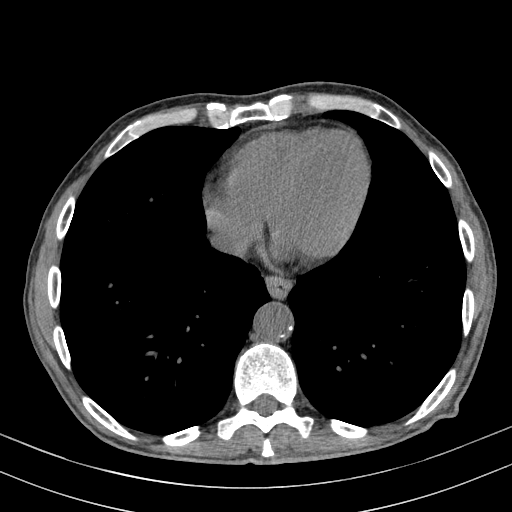
[im 60/167  lung]
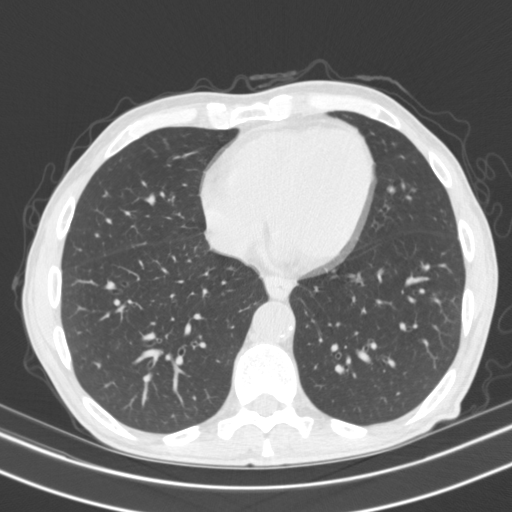
[im 72/167  lung]
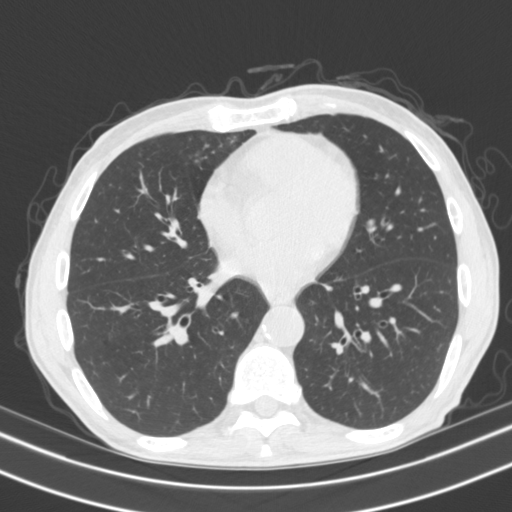
[im 79/167  lung]
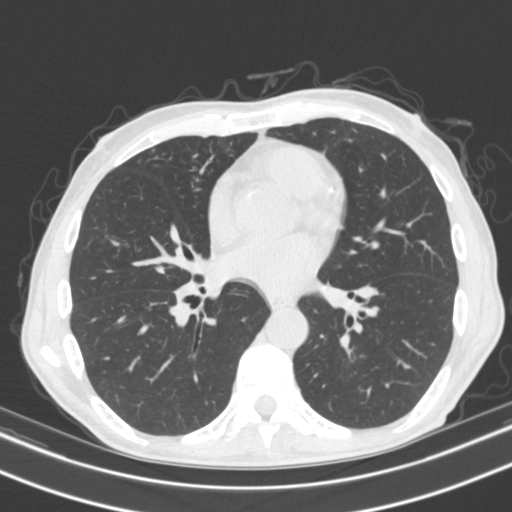
[im 84/167  lung]
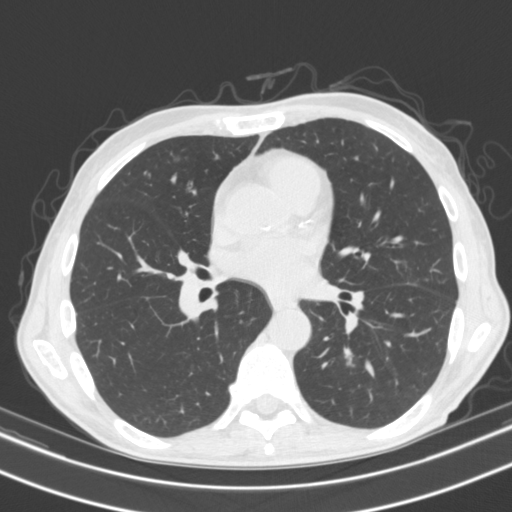
[im 95/167  mediastinal]
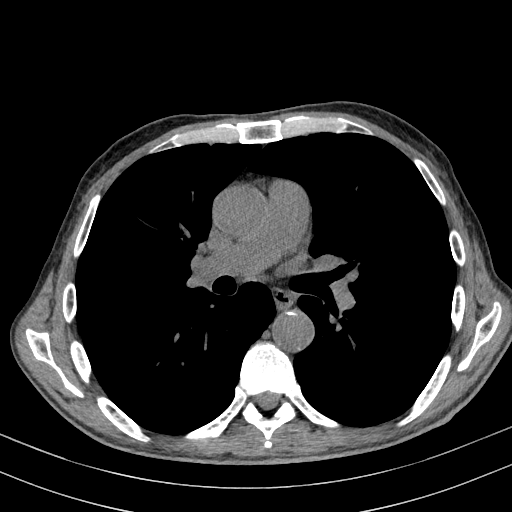
[im 95/167  lung]
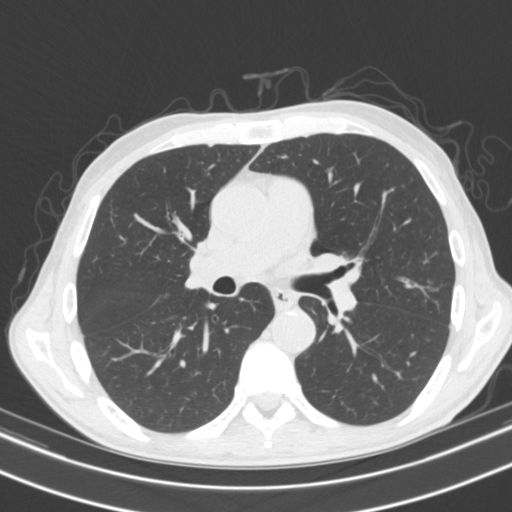
[im 107/167  lung]
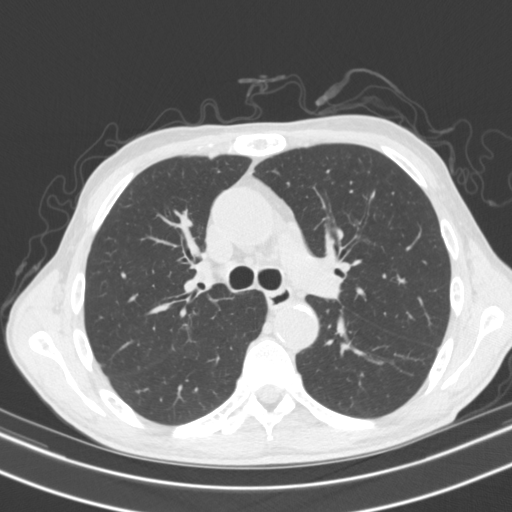
[im 119/167  lung]
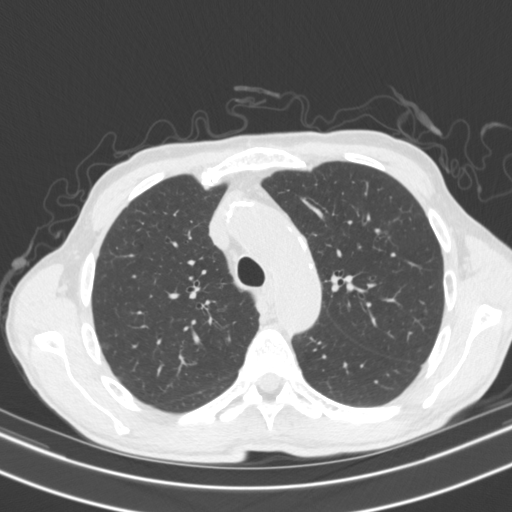
[im 131/167  lung]
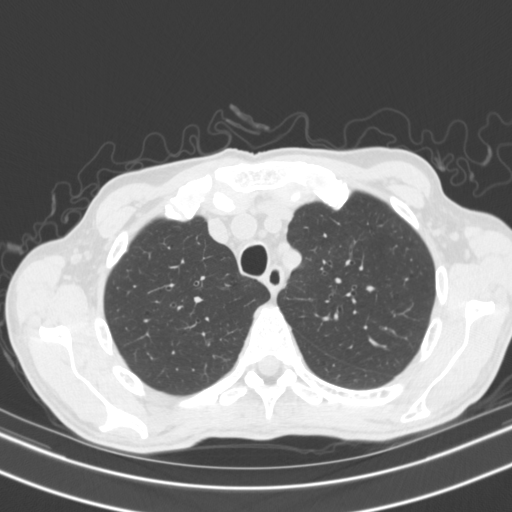
[im 143/167  mediastinal]
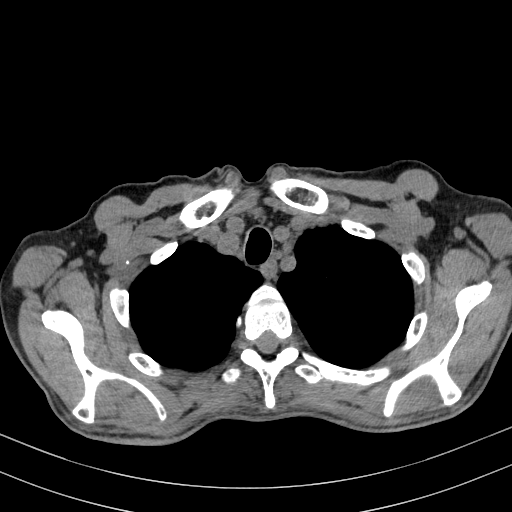
[im 143/167  lung]
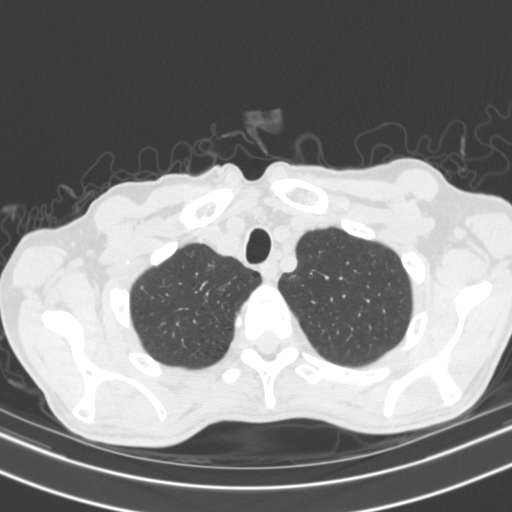
[im 155/167  lung]
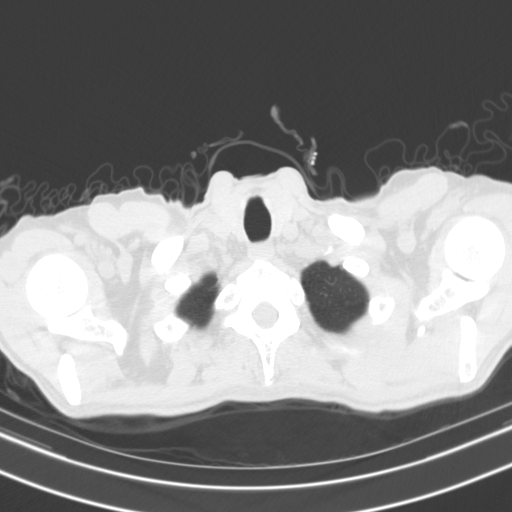

[14 of 30 positions shown; findings below may reference images not displayed]

FINDINGS: Cardiovascular: Normal heart size. No pericardial effusion
identified. Aortic atherosclerosis. Calcification in the LAD
coronary artery noted.

Mediastinum/Nodes: Normal appearance of the thyroid gland. The
trachea appears patent and is midline. No enlarged mediastinal,
supraclavicular, or axillary lymph nodes.

Lungs/Pleura: No pleural effusion. Mild changes of emphysema. The
previously noted mixed ground-glass and solid nodule within the
posterior left upper lobe has decreased in size on today's study. On
today's exam this appears almost entirely solid with a maximum
diameter of 1.8 cm, image 65/5. Previously this measured 2.6 cm.

The previously noted part solid nodule within the central left lower
lobe has also decreased in size in the interval. On today's study
this measures 1.2 cm, image 85/3. Previously 2.9 cm.

Also in the left lower lobe is a 4 mm cavitary nodule, image 87/3.
Previously 1.3 cm.

Upper Abdomen: No acute abnormality identified. Stones identified
within the gallbladder. There is a left kidney cyst measuring
cm, too small to characterize.

Musculoskeletal: No aggressive lytic or sclerotic bone lesions
identified.
IMPRESSION: 1. There has been interval decrease in size of the part solid nodule
within the left lung. These findings are compatible with and
inflammatory/infectious process. As the patient is at increased risk
for lung cancer, continued interval follow-up of these nodules is
recommended to ensure complete resolution and to exclude underlying
malignancy. The next followup exam should be obtained in 3 months
with repeat CT of the chest.
2. Aortic Atherosclerosis ([FE]-[FE]) and Emphysema ([FE]-[FE]).
3. Coronary artery atherosclerotic calcifications.
4. Gallstones.

## 2018-11-23 DIAGNOSIS — J438 Other emphysema: Secondary | ICD-10-CM | POA: Insufficient documentation

## 2019-01-25 DIAGNOSIS — E291 Testicular hypofunction: Secondary | ICD-10-CM | POA: Diagnosis not present

## 2019-01-25 DIAGNOSIS — R3912 Poor urinary stream: Secondary | ICD-10-CM | POA: Diagnosis not present

## 2019-01-25 DIAGNOSIS — N401 Enlarged prostate with lower urinary tract symptoms: Secondary | ICD-10-CM | POA: Diagnosis not present

## 2019-01-25 DIAGNOSIS — R972 Elevated prostate specific antigen [PSA]: Secondary | ICD-10-CM | POA: Diagnosis not present

## 2019-02-17 ENCOUNTER — Other Ambulatory Visit: Payer: Self-pay | Admitting: Physician Assistant

## 2019-02-17 DIAGNOSIS — Z1231 Encounter for screening mammogram for malignant neoplasm of breast: Secondary | ICD-10-CM

## 2019-02-17 DIAGNOSIS — E782 Mixed hyperlipidemia: Secondary | ICD-10-CM | POA: Diagnosis not present

## 2019-02-17 DIAGNOSIS — R634 Abnormal weight loss: Secondary | ICD-10-CM | POA: Diagnosis not present

## 2019-02-17 DIAGNOSIS — E1165 Type 2 diabetes mellitus with hyperglycemia: Secondary | ICD-10-CM | POA: Diagnosis not present

## 2019-02-17 DIAGNOSIS — R1030 Lower abdominal pain, unspecified: Secondary | ICD-10-CM

## 2019-02-17 DIAGNOSIS — E559 Vitamin D deficiency, unspecified: Secondary | ICD-10-CM | POA: Diagnosis not present

## 2019-02-17 DIAGNOSIS — N183 Chronic kidney disease, stage 3 (moderate): Secondary | ICD-10-CM | POA: Diagnosis not present

## 2019-02-17 DIAGNOSIS — R112 Nausea with vomiting, unspecified: Secondary | ICD-10-CM | POA: Diagnosis not present

## 2019-02-17 DIAGNOSIS — T50905A Adverse effect of unspecified drugs, medicaments and biological substances, initial encounter: Secondary | ICD-10-CM | POA: Diagnosis not present

## 2019-02-17 DIAGNOSIS — L720 Epidermal cyst: Secondary | ICD-10-CM

## 2019-02-18 ENCOUNTER — Other Ambulatory Visit: Payer: Self-pay | Admitting: Physician Assistant

## 2019-02-18 DIAGNOSIS — R911 Solitary pulmonary nodule: Secondary | ICD-10-CM

## 2019-02-19 ENCOUNTER — Ambulatory Visit
Admission: RE | Admit: 2019-02-19 | Discharge: 2019-02-19 | Disposition: A | Discharge status: Home / Self Care | Payer: PRIVATE HEALTH INSURANCE | Source: Ambulatory Visit | Attending: Physician Assistant | Admitting: Physician Assistant

## 2019-02-19 DIAGNOSIS — R1909 Other intra-abdominal and pelvic swelling, mass and lump: Secondary | ICD-10-CM | POA: Diagnosis not present

## 2019-02-19 DIAGNOSIS — L720 Epidermal cyst: Secondary | ICD-10-CM

## 2019-02-19 DIAGNOSIS — R1030 Lower abdominal pain, unspecified: Secondary | ICD-10-CM

## 2019-02-24 ENCOUNTER — Ambulatory Visit
Admission: RE | Admit: 2019-02-24 | Discharge: 2019-02-24 | Disposition: A | Discharge status: Home / Self Care | Payer: BLUE CROSS/BLUE SHIELD | Source: Ambulatory Visit | Attending: Physician Assistant | Admitting: Physician Assistant

## 2019-02-24 DIAGNOSIS — R911 Solitary pulmonary nodule: Secondary | ICD-10-CM

## 2019-02-24 DIAGNOSIS — J439 Emphysema, unspecified: Secondary | ICD-10-CM | POA: Diagnosis not present

## 2019-02-24 IMAGING — CT CT CHEST W/O CM
1 series · 15 of 34 positions shown, 19 images · non-contrast
Comparison: CT scan of [DATE].

CLINICAL DATA: Lung nodule.

EXAM:
CT CHEST WITHOUT CONTRAST
TECHNIQUE: Multidetector CT imaging of the chest was performed following the
standard protocol without IV contrast.

[Series 2: chest w/(date) · axial · 0.63mm/px · z∈[-306,-44]mm · 15 of 155 slices shown, 19 images]
[im 12/155  mediastinal]
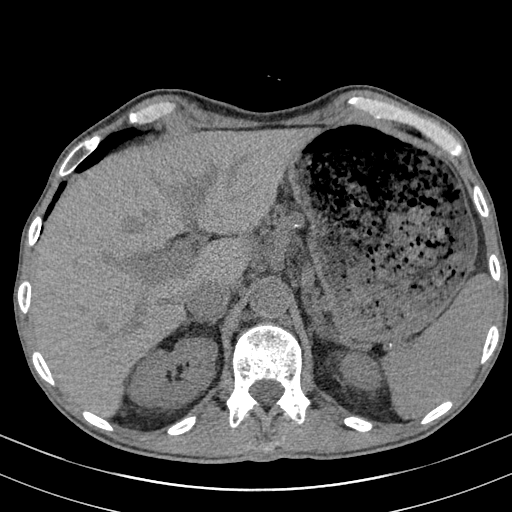
[im 12/155  lung]
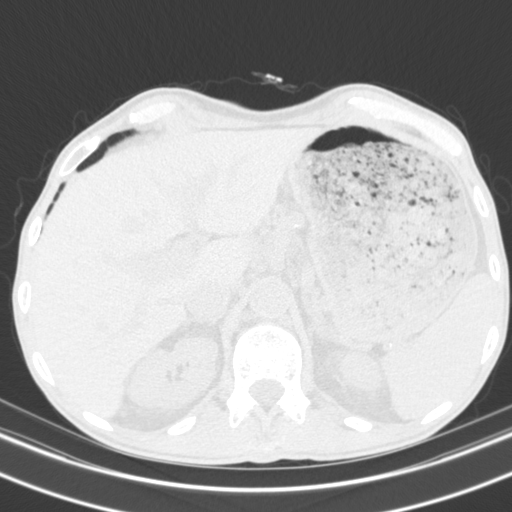
[im 23/155  lung]
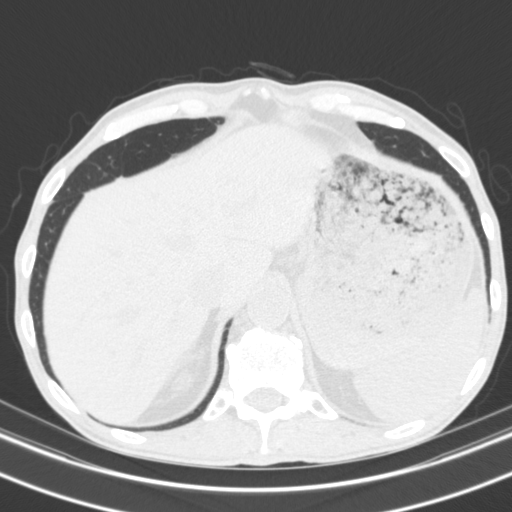
[im 31/155  lung]
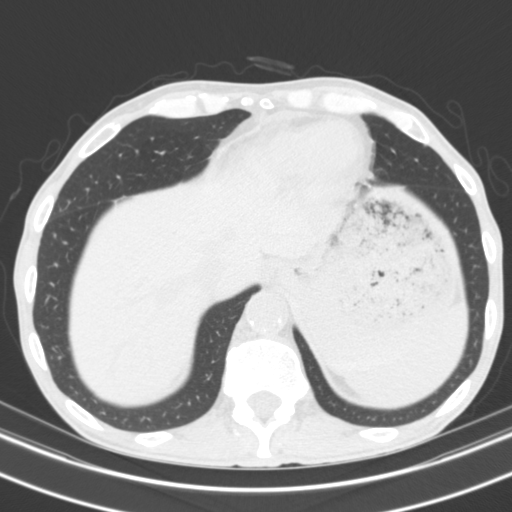
[im 40/155  lung]
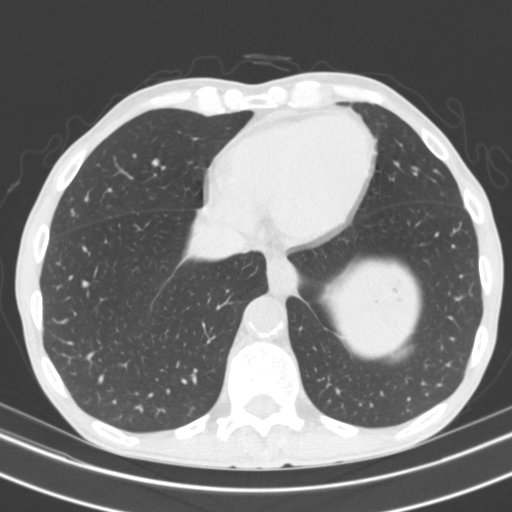
[im 52/155  mediastinal]
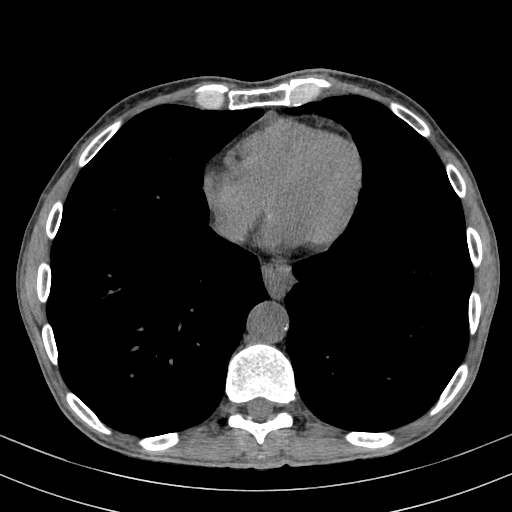
[im 52/155  lung]
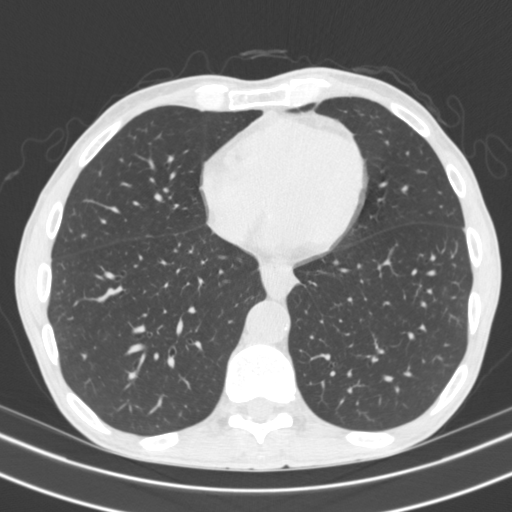
[im 62/155  lung]
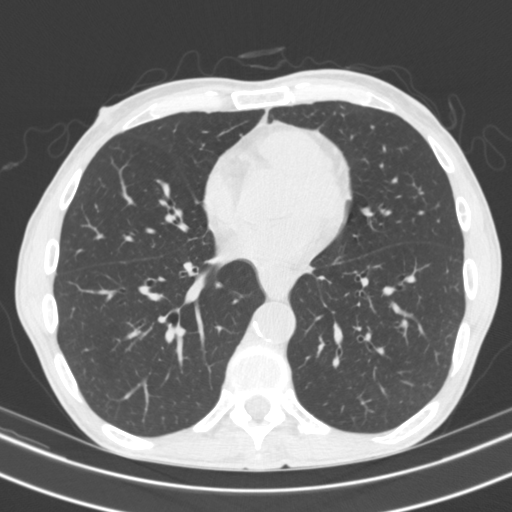
[im 69/155  lung]
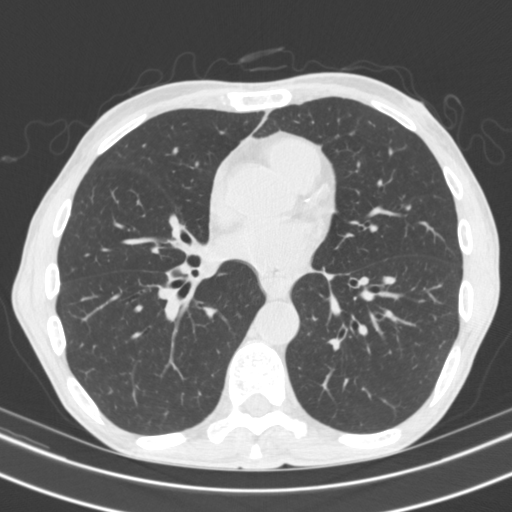
[im 80/155  lung]
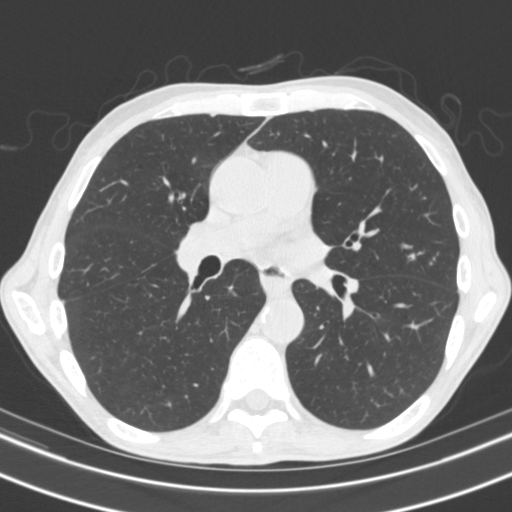
[im 86/155  mediastinal]
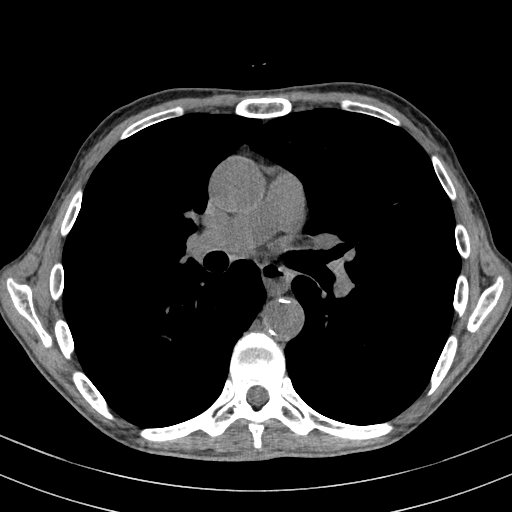
[im 86/155  lung]
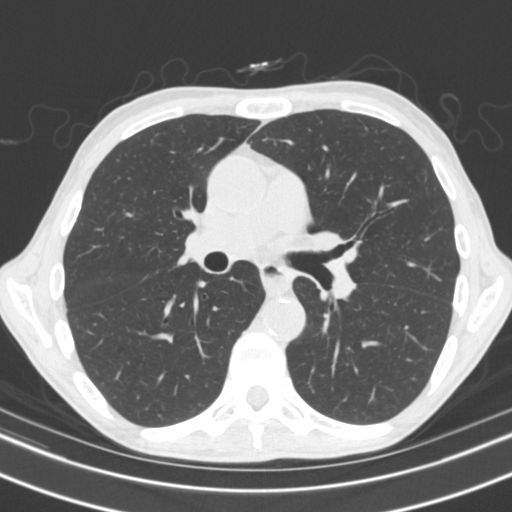
[im 93/155  lung]
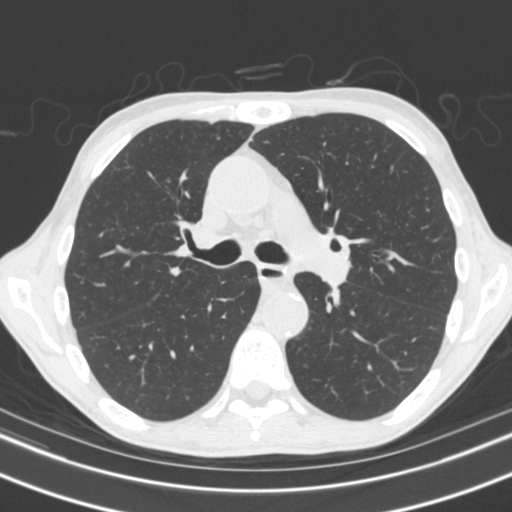
[im 103/155  lung]
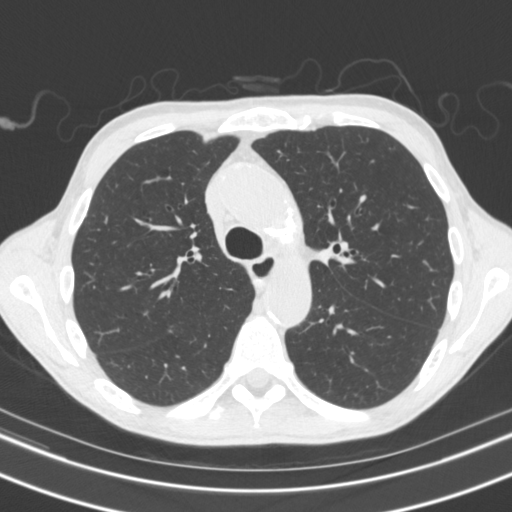
[im 115/155  lung]
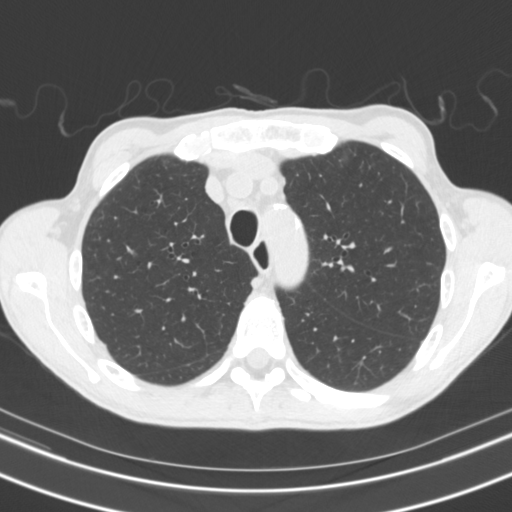
[im 124/155  mediastinal]
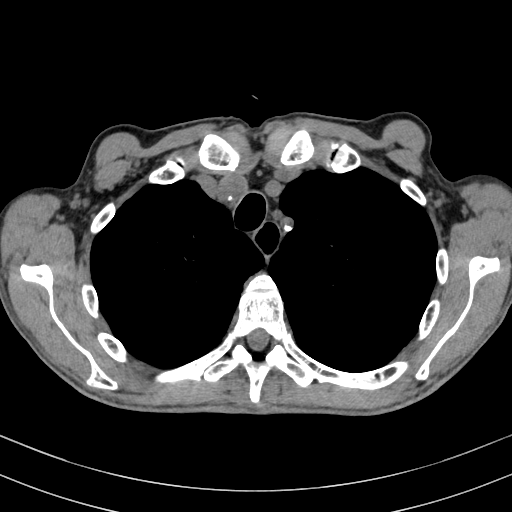
[im 124/155  lung]
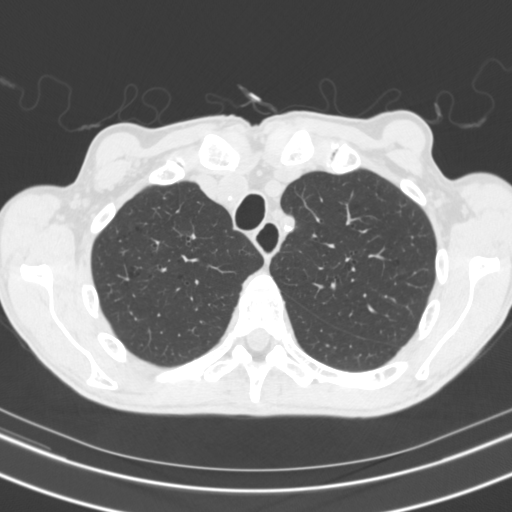
[im 132/155  lung]
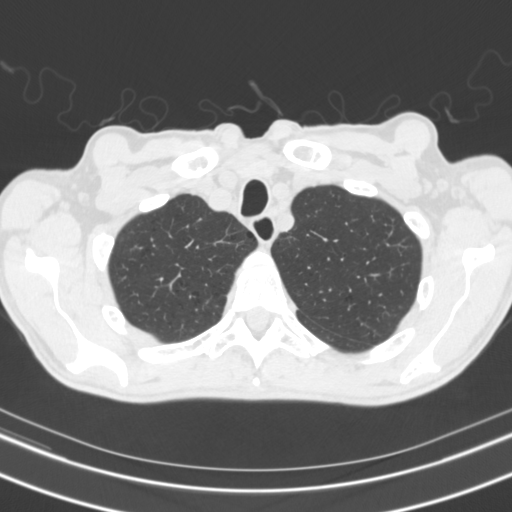
[im 143/155  lung]
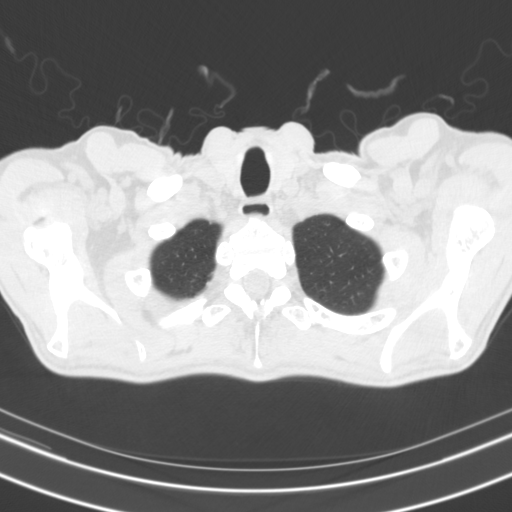

[15 of 34 positions shown; findings below may reference images not displayed]

FINDINGS: Cardiovascular: Atherosclerosis of thoracic aorta is noted without
aneurysm formation. Normal cardiac size. No pericardial effusion.
Coronary artery calcifications are noted.

Mediastinum/Nodes: No enlarged mediastinal or axillary lymph nodes.
Thyroid gland, trachea, and esophagus demonstrate no significant
findings.

Lungs/Pleura: No pneumothorax or pleural effusion is noted. Minimal
emphysematous disease is noted in the right upper lobe. Left upper
lobe opacity noted on prior exam is nearly resolved, with only 6 mm
nodular density remaining best seen on image number 77 of series 3.
The opacification seen in left lower lobe on prior exam have
resolved.

Upper Abdomen: No acute abnormality.

Musculoskeletal: No chest wall mass or suspicious bone lesions
identified.
IMPRESSION: Left lower lobe opacities noted on prior exam have completely
resolved, most likely be inflammatory in etiology. Left upper lobe
opacity also is nearly resolved, also most likely inflammatory in
etiology. Residual 6 mm nodular density is noted in the left upper
lobe. Non-contrast chest CT at 6-12 months is recommended. If the
nodule is stable at time of repeat CT, then future CT at 18-24
months (from today's scan) is considered optional for low-risk
patients, but is recommended for high-risk patients. This
recommendation follows the consensus statement: Guidelines for
Management of Incidental Pulmonary Nodules Detected on CT Images:

Coronary artery calcifications are noted.

Aortic Atherosclerosis ([D0]-[D0]) and Emphysema ([D0]-[D0]).

## 2019-02-28 HISTORY — PX: UPPER GASTROINTESTINAL ENDOSCOPY: SHX188

## 2019-03-01 DIAGNOSIS — R634 Abnormal weight loss: Secondary | ICD-10-CM | POA: Diagnosis not present

## 2019-03-01 DIAGNOSIS — R112 Nausea with vomiting, unspecified: Secondary | ICD-10-CM | POA: Diagnosis not present

## 2019-03-04 DIAGNOSIS — B3781 Candidal esophagitis: Secondary | ICD-10-CM | POA: Diagnosis not present

## 2019-03-04 DIAGNOSIS — R634 Abnormal weight loss: Secondary | ICD-10-CM | POA: Diagnosis not present

## 2019-03-04 DIAGNOSIS — R1310 Dysphagia, unspecified: Secondary | ICD-10-CM | POA: Diagnosis not present

## 2019-03-04 DIAGNOSIS — R112 Nausea with vomiting, unspecified: Secondary | ICD-10-CM | POA: Diagnosis not present

## 2019-03-04 DIAGNOSIS — K210 Gastro-esophageal reflux disease with esophagitis: Secondary | ICD-10-CM | POA: Diagnosis not present

## 2019-03-04 DIAGNOSIS — K209 Esophagitis, unspecified: Secondary | ICD-10-CM | POA: Diagnosis not present

## 2019-03-24 ENCOUNTER — Other Ambulatory Visit: Payer: Self-pay

## 2019-03-24 ENCOUNTER — Encounter: Payer: BLUE CROSS/BLUE SHIELD | Attending: Physician Assistant | Admitting: *Deleted

## 2019-03-24 DIAGNOSIS — E1169 Type 2 diabetes mellitus with other specified complication: Secondary | ICD-10-CM | POA: Diagnosis not present

## 2019-03-24 NOTE — Patient Instructions (Addendum)
Plan:   Aim for 3 Carb Choices per meal (45 grams) +/- 1 either way   Aim for 0-2 Carbs per snack if hungry   Include protein in moderation with your meals and snacks  Consider reading food labels for Total Carbohydrate of foods  Continue checking BG at alternate times per day with Libre CGM  Consider asking your MD about alternative medication options. You are experiencing early satiety, nausea and poor appetite that is affecting your ability to eat adequate food. You also shared the probable diagnosis of gastroparesis, which also impairs digestion of your food in a timely manner.  The Trulicity often causes early satiety and can cause nausea and weight loss. Per your Libre CGM, there are broad swings in your BG (100-300 mg/dl) almost every day. We discussed the possibility of using insulin in place of Trulicity. There is an insulin delivery device made by [employer] called the Omnipod DASH which delivers rapid acting insulin in very small doses that can be adjusted as needed to help control BG more effectively. You expressed interest in this as it is filled with 3 days worth of insulin and is worn like a patch.You would change the pod every 3 days.  So you would not need to give injections, the insulin is delivered through the pod and controlled by the Personal Diabetes Manager that comes with it. I am happy to speak to your MD about this option if interested.

## 2019-03-24 NOTE — Progress Notes (Signed)
Diabetes Self-Management Education  Visit Type: First/Initial  Appt. Start Time: 0800 Appt. End Time: 0930  03/24/2019  [name], identified by name and date of birth, is a 63 y.o. male with a diagnosis of Diabetes: Type 2. Patient is here with his wife, Duane Hall, who participated in the visit along with the patient . They explained to me his history with recent unexplained weight loss from 170 pounds down to 116 pounds last week. He has recently seen a gastroenterologist, who is testing him for gastroparesis. He is complaining of frequent nausea, early satiety and weight loss. He has purchased the [place] CGM to follow his BG which reveals wide swings in BG from below 100 to over 300 mg/dl daily. 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 He has many food preferences along with intolerances which he shared throughout the visit.  ASSESSMENT  Height 5\' 9"  (1.753 m), weight 121 lb 4.8 oz (55 kg). Body mass index is 17.91 kg/m.  Diabetes Self-Management Education - 03/24/19 0808      Visit Information   Visit Type  First/Initial      Initial Visit   Diabetes Type  Type 2    Are you currently following a meal plan?  No    Are you taking your medications as prescribed?  Yes    Date Diagnosed  2012      Health Coping   How would you rate your overall health?  Poor      Psychosocial Assessment   Patient Belief/Attitude about Diabetes  --   no answer from patient   Self-care barriers  Debilitated state due to current medical condition    Self-management support  Family    Other persons present  Patient;Spouse/SO    Patient Concerns  Weight Control;Nutrition/Meal planning;Glycemic Control;Medication   excessive weight loss   Special Needs  None    Preferred Learning Style  No preference indicated    Learning Readiness  Change in progress    How often do you need to have someone help you when you read instructions, pamphlets, or other  written materials from your doctor or pharmacy?  1 - Never    What is the last grade level you completed in school?  some college      Pre-Education Assessment   Patient understands the diabetes disease and treatment process.  Needs Instruction    Patient understands incorporating nutritional management into lifestyle.  Needs Instruction    Patient undertands incorporating physical activity into lifestyle.  Needs Instruction    Patient understands using medications safely.  Needs Instruction    Patient understands monitoring blood glucose, interpreting and using results  Needs Review    Patient understands prevention, detection, and treatment of acute complications.  Needs Instruction    Patient understands prevention, detection, and treatment of chronic complications.  Needs Instruction    Patient understands how to develop strategies to address psychosocial issues.  Needs Instruction    Patient understands how to develop strategies to promote health/change behavior.  Needs Instruction      Complications   Last HgB A1C per patient/outside source  6.1 %    How often do you check your blood sugar?  > 4 times/day    Fasting Blood glucose range (mg/dL)  70-129;130-179;180-200    Postprandial Blood glucose range (mg/dL)  70-129;130-179;180-200;>200    Number of hypoglycemic episodes per month  1    Have you had a dilated eye  exam in the past 12 months?  No    Have you had a dental exam in the past 12 months?  No    Are you checking your feet?  No      Dietary Intake   Breakfast  1 toast with 1 tsp butter & jelly or PNB    Snack (morning)  brings to work: 8 crackers with cheese, 1-2 applesauce, jello, PNB filled pretzels    Lunch  buys fast food: cheese burger and fresh fruit cup    Dinner  no meat usually, maybe potato with limited vegetables    Beverage(s)  water, 1 can regular coke that he drinks all day      Exercise   Exercise Type  ADL's    How many days per week to you exercise?  0     How many minutes per day do you exercise?  0    Total minutes per week of exercise  0      Patient Education   Previous Diabetes Education  No    Disease state   Definition of diabetes, type 1 and 2, and the diagnosis of diabetes;Factors that contribute to the development of diabetes    Nutrition management   Role of diet in the treatment of diabetes and the relationship between the three main macronutrients and blood glucose level;Food label reading, portion sizes and measuring food.;Carbohydrate counting;Reviewed blood glucose goals for pre and post meals and how to evaluate the patients' food intake on their blood glucose level.    Medications  Reviewed patients medication for diabetes, action, purpose, timing of dose and side effects.    Monitoring  --   patient is using Libre CGM   Chronic complications  Identified and discussed with patient  current chronic complications   Apparent gastroparesis   Psychosocial adjustment  Worked with patient to identify barriers to care and solutions;Role of stress on diabetes      Individualized Goals (developed by patient)   Nutrition  Follow meal plan discussed    Medications  Other (comment)   discussed side affects of current medications and possible alternatives   Monitoring   test blood glucose pre and post meals as discussed      Post-Education Assessment   Patient understands the diabetes disease and treatment process.  Demonstrates understanding / competency    Patient understands incorporating nutritional management into lifestyle.  Demonstrates understanding / competency    Patient understands using medications safely.  Demonstrates understanding / competency    Patient understands monitoring blood glucose, interpreting and using results  Demonstrates understanding / competency    Patient understands prevention, detection, and treatment of acute complications.  Demonstrates understanding / competency    Patient understands prevention,  detection, and treatment of chronic complications.  Demonstrates understanding / competency    Patient understands how to develop strategies to address psychosocial issues.  Demonstrates understanding / competency      Outcomes   Expected Outcomes  Demonstrated interest in learning. Expect positive outcomes    Future DMSE  PRN    Program Status  Not Completed       Individualized Plan for Diabetes Self-Management Training:   Learning Objective:  Patient will have a greater understanding of diabetes self-management. Patient education plan is to attend individual and/or group sessions per assessed needs and concerns.   Plan:   Patient Instructions  Plan:   Aim for 3 Carb Choices per meal (45 grams) +/- 1 either way  Aim for 0-2 Carbs per snack if hungry   Include protein in moderation with your meals and snacks  Consider the use of Carnation Essentials Powder added to milk as a supplemental nutrition source for you.   Consider reading food labels for Total Carbohydrate of foods  Continue checking BG at alternate times per day with Libre CGM  Consider asking your MD about alternative medication options. You are experiencing early satiety, nausea and poor appetite that is affecting your ability to eat adequate food. You also shared the probable diagnosis of gastroparesis, which also impairs digestion of your food in a timely manner.  The Trulicity often causes early satiety and can cause nausea and weight loss. Per your Libre CGM, there are broad swings in your BG (100-300 mg/dl) almost every day. We discussed the possibility of using insulin in place of Trulicity. There is an insulin delivery device made by [employer] called the Omnipod DASH which delivers rapid acting insulin in very small doses that can be adjusted as needed to help control BG more effectively. You expressed interest in this as it is filled with 3 days worth of insulin and is worn like a patch.You would change the pod  every 3 days.  So you would not need to give injections, the insulin is delivered through the pod and controlled by the Personal Diabetes Manager that comes with it. I am happy to speak to your MD about this option if interested.   Expected Outcomes:  Demonstrated interest in learning. Expect positive outcomes  Education material provided: Food label handouts, A1C conversion sheet, Meal plan card and Carbohydrate counting sheet, Diabetes Medication handout, Omnipod Dash brochure  If problems or questions, patient to contact team via:  Phone  Future DSME appointment: PRN

## 2019-04-07 ENCOUNTER — Other Ambulatory Visit: Payer: Self-pay | Admitting: Gastroenterology

## 2019-04-07 ENCOUNTER — Other Ambulatory Visit (HOSPITAL_COMMUNITY): Payer: Self-pay | Admitting: Gastroenterology

## 2019-04-07 DIAGNOSIS — R109 Unspecified abdominal pain: Secondary | ICD-10-CM

## 2019-04-12 DIAGNOSIS — R972 Elevated prostate specific antigen [PSA]: Secondary | ICD-10-CM | POA: Diagnosis not present

## 2019-04-14 ENCOUNTER — Encounter (HOSPITAL_COMMUNITY): Payer: Self-pay

## 2019-04-14 ENCOUNTER — Encounter (HOSPITAL_COMMUNITY): Payer: BLUE CROSS/BLUE SHIELD

## 2019-04-15 DIAGNOSIS — B1920 Unspecified viral hepatitis C without hepatic coma: Secondary | ICD-10-CM | POA: Diagnosis not present

## 2019-04-15 DIAGNOSIS — N183 Chronic kidney disease, stage 3 (moderate): Secondary | ICD-10-CM | POA: Diagnosis not present

## 2019-04-15 DIAGNOSIS — R809 Proteinuria, unspecified: Secondary | ICD-10-CM | POA: Diagnosis not present

## 2019-04-15 DIAGNOSIS — N189 Chronic kidney disease, unspecified: Secondary | ICD-10-CM | POA: Diagnosis not present

## 2019-04-15 DIAGNOSIS — E1129 Type 2 diabetes mellitus with other diabetic kidney complication: Secondary | ICD-10-CM | POA: Diagnosis not present

## 2019-04-19 DIAGNOSIS — N401 Enlarged prostate with lower urinary tract symptoms: Secondary | ICD-10-CM | POA: Diagnosis not present

## 2019-04-19 DIAGNOSIS — E230 Hypopituitarism: Secondary | ICD-10-CM | POA: Diagnosis not present

## 2019-04-19 DIAGNOSIS — R3912 Poor urinary stream: Secondary | ICD-10-CM | POA: Diagnosis not present

## 2019-04-19 DIAGNOSIS — R972 Elevated prostate specific antigen [PSA]: Secondary | ICD-10-CM | POA: Diagnosis not present

## 2019-05-05 DIAGNOSIS — D229 Melanocytic nevi, unspecified: Secondary | ICD-10-CM | POA: Diagnosis not present

## 2019-05-05 DIAGNOSIS — L720 Epidermal cyst: Secondary | ICD-10-CM | POA: Diagnosis not present

## 2019-05-05 DIAGNOSIS — L662 Folliculitis decalvans: Secondary | ICD-10-CM | POA: Diagnosis not present

## 2019-05-18 ENCOUNTER — Other Ambulatory Visit: Payer: Self-pay

## 2019-05-18 ENCOUNTER — Ambulatory Visit (HOSPITAL_COMMUNITY)
Admission: RE | Admit: 2019-05-18 | Discharge: 2019-05-18 | Disposition: A | Discharge status: Home / Self Care | Payer: BLUE CROSS/BLUE SHIELD | Source: Ambulatory Visit | Attending: Gastroenterology | Admitting: Gastroenterology

## 2019-05-18 DIAGNOSIS — R109 Unspecified abdominal pain: Secondary | ICD-10-CM | POA: Insufficient documentation

## 2019-05-18 DIAGNOSIS — K3184 Gastroparesis: Secondary | ICD-10-CM | POA: Diagnosis not present

## 2019-05-18 MED ORDER — TECHNETIUM TC 99M SULFUR COLLOID
2.0000 | Freq: Once | INTRAVENOUS | Status: AC | PRN
  Administered 2019-05-18: 2 via INTRAVENOUS

## 2019-05-27 DIAGNOSIS — L720 Epidermal cyst: Secondary | ICD-10-CM | POA: Diagnosis not present

## 2019-06-10 DIAGNOSIS — R609 Edema, unspecified: Secondary | ICD-10-CM | POA: Diagnosis not present

## 2019-06-10 DIAGNOSIS — R634 Abnormal weight loss: Secondary | ICD-10-CM | POA: Diagnosis not present

## 2019-06-10 DIAGNOSIS — K3184 Gastroparesis: Secondary | ICD-10-CM | POA: Diagnosis not present

## 2019-06-10 DIAGNOSIS — E1165 Type 2 diabetes mellitus with hyperglycemia: Secondary | ICD-10-CM | POA: Diagnosis not present

## 2019-06-10 DIAGNOSIS — E119 Type 2 diabetes mellitus without complications: Secondary | ICD-10-CM | POA: Diagnosis not present

## 2019-06-10 DIAGNOSIS — M6259 Muscle wasting and atrophy, not elsewhere classified, multiple sites: Secondary | ICD-10-CM | POA: Diagnosis not present

## 2019-06-29 DIAGNOSIS — M6259 Muscle wasting and atrophy, not elsewhere classified, multiple sites: Secondary | ICD-10-CM | POA: Diagnosis not present

## 2019-06-29 DIAGNOSIS — R609 Edema, unspecified: Secondary | ICD-10-CM | POA: Diagnosis not present

## 2019-06-29 DIAGNOSIS — E1165 Type 2 diabetes mellitus with hyperglycemia: Secondary | ICD-10-CM | POA: Diagnosis not present

## 2019-06-29 DIAGNOSIS — K3184 Gastroparesis: Secondary | ICD-10-CM | POA: Diagnosis not present

## 2019-07-19 DIAGNOSIS — K5900 Constipation, unspecified: Secondary | ICD-10-CM | POA: Diagnosis not present

## 2019-07-19 DIAGNOSIS — K209 Esophagitis, unspecified: Secondary | ICD-10-CM | POA: Diagnosis not present

## 2019-07-19 DIAGNOSIS — Z1211 Encounter for screening for malignant neoplasm of colon: Secondary | ICD-10-CM | POA: Diagnosis not present

## 2019-07-19 DIAGNOSIS — R634 Abnormal weight loss: Secondary | ICD-10-CM | POA: Diagnosis not present

## 2019-07-29 DIAGNOSIS — R600 Localized edema: Secondary | ICD-10-CM | POA: Diagnosis not present

## 2019-08-09 DIAGNOSIS — K3184 Gastroparesis: Secondary | ICD-10-CM | POA: Diagnosis not present

## 2019-08-09 DIAGNOSIS — E119 Type 2 diabetes mellitus without complications: Secondary | ICD-10-CM | POA: Diagnosis not present

## 2019-08-09 DIAGNOSIS — R609 Edema, unspecified: Secondary | ICD-10-CM | POA: Diagnosis not present

## 2019-08-09 DIAGNOSIS — M6259 Muscle wasting and atrophy, not elsewhere classified, multiple sites: Secondary | ICD-10-CM | POA: Diagnosis not present

## 2019-08-27 DIAGNOSIS — N183 Chronic kidney disease, stage 3 (moderate): Secondary | ICD-10-CM | POA: Diagnosis not present

## 2019-08-27 DIAGNOSIS — E1129 Type 2 diabetes mellitus with other diabetic kidney complication: Secondary | ICD-10-CM | POA: Diagnosis not present

## 2019-08-27 DIAGNOSIS — R399 Unspecified symptoms and signs involving the genitourinary system: Secondary | ICD-10-CM | POA: Diagnosis not present

## 2019-08-27 DIAGNOSIS — R809 Proteinuria, unspecified: Secondary | ICD-10-CM | POA: Diagnosis not present

## 2019-08-31 ENCOUNTER — Other Ambulatory Visit (HOSPITAL_COMMUNITY): Payer: Self-pay | Admitting: Internal Medicine

## 2019-08-31 DIAGNOSIS — N179 Acute kidney failure, unspecified: Secondary | ICD-10-CM

## 2019-08-31 DIAGNOSIS — R809 Proteinuria, unspecified: Secondary | ICD-10-CM

## 2019-09-08 ENCOUNTER — Emergency Department (HOSPITAL_COMMUNITY): Payer: BC Managed Care – PPO

## 2019-09-08 ENCOUNTER — Inpatient Hospital Stay (HOSPITAL_COMMUNITY)
Admission: EM | Admit: 2019-09-08 | Discharge: 2019-09-14 | DRG: 982 | Disposition: A | Discharge status: Home / Self Care | Payer: BC Managed Care – PPO | Attending: Internal Medicine | Admitting: Internal Medicine

## 2019-09-08 ENCOUNTER — Other Ambulatory Visit: Payer: Self-pay

## 2019-09-08 ENCOUNTER — Encounter: Payer: Self-pay | Admitting: Internal Medicine

## 2019-09-08 ENCOUNTER — Inpatient Hospital Stay (HOSPITAL_COMMUNITY): Payer: BC Managed Care – PPO

## 2019-09-08 DIAGNOSIS — I10 Essential (primary) hypertension: Secondary | ICD-10-CM | POA: Diagnosis present

## 2019-09-08 DIAGNOSIS — E1151 Type 2 diabetes mellitus with diabetic peripheral angiopathy without gangrene: Secondary | ICD-10-CM | POA: Diagnosis not present

## 2019-09-08 DIAGNOSIS — N183 Chronic kidney disease, stage 3 (moderate): Secondary | ICD-10-CM | POA: Diagnosis not present

## 2019-09-08 DIAGNOSIS — E876 Hypokalemia: Secondary | ICD-10-CM

## 2019-09-08 DIAGNOSIS — K3184 Gastroparesis: Secondary | ICD-10-CM | POA: Diagnosis present

## 2019-09-08 DIAGNOSIS — H409 Unspecified glaucoma: Secondary | ICD-10-CM | POA: Diagnosis present

## 2019-09-08 DIAGNOSIS — N179 Acute kidney failure, unspecified: Secondary | ICD-10-CM | POA: Diagnosis not present

## 2019-09-08 DIAGNOSIS — D631 Anemia in chronic kidney disease: Secondary | ICD-10-CM | POA: Diagnosis not present

## 2019-09-08 DIAGNOSIS — J45909 Unspecified asthma, uncomplicated: Secondary | ICD-10-CM | POA: Diagnosis present

## 2019-09-08 DIAGNOSIS — K5900 Constipation, unspecified: Secondary | ICD-10-CM | POA: Diagnosis not present

## 2019-09-08 DIAGNOSIS — N049 Nephrotic syndrome with unspecified morphologic changes: Secondary | ICD-10-CM

## 2019-09-08 DIAGNOSIS — E1143 Type 2 diabetes mellitus with diabetic autonomic (poly)neuropathy: Secondary | ICD-10-CM | POA: Diagnosis not present

## 2019-09-08 DIAGNOSIS — I120 Hypertensive chronic kidney disease with stage 5 chronic kidney disease or end stage renal disease: Secondary | ICD-10-CM | POA: Diagnosis not present

## 2019-09-08 DIAGNOSIS — T502X5A Adverse effect of carbonic-anhydrase inhibitors, benzothiadiazides and other diuretics, initial encounter: Secondary | ICD-10-CM | POA: Diagnosis present

## 2019-09-08 DIAGNOSIS — F17210 Nicotine dependence, cigarettes, uncomplicated: Secondary | ICD-10-CM | POA: Diagnosis present

## 2019-09-08 DIAGNOSIS — E8770 Fluid overload, unspecified: Secondary | ICD-10-CM

## 2019-09-08 DIAGNOSIS — Z79899 Other long term (current) drug therapy: Secondary | ICD-10-CM

## 2019-09-08 DIAGNOSIS — E113599 Type 2 diabetes mellitus with proliferative diabetic retinopathy without macular edema, unspecified eye: Secondary | ICD-10-CM | POA: Diagnosis present

## 2019-09-08 DIAGNOSIS — E1129 Type 2 diabetes mellitus with other diabetic kidney complication: Secondary | ICD-10-CM | POA: Diagnosis not present

## 2019-09-08 DIAGNOSIS — R319 Hematuria, unspecified: Secondary | ICD-10-CM | POA: Diagnosis not present

## 2019-09-08 DIAGNOSIS — E785 Hyperlipidemia, unspecified: Secondary | ICD-10-CM | POA: Diagnosis not present

## 2019-09-08 DIAGNOSIS — D638 Anemia in other chronic diseases classified elsewhere: Secondary | ICD-10-CM | POA: Diagnosis present

## 2019-09-08 DIAGNOSIS — N19 Unspecified kidney failure: Secondary | ICD-10-CM | POA: Diagnosis not present

## 2019-09-08 DIAGNOSIS — D509 Iron deficiency anemia, unspecified: Secondary | ICD-10-CM | POA: Diagnosis present

## 2019-09-08 DIAGNOSIS — R600 Localized edema: Secondary | ICD-10-CM | POA: Diagnosis not present

## 2019-09-08 DIAGNOSIS — N189 Chronic kidney disease, unspecified: Secondary | ICD-10-CM

## 2019-09-08 DIAGNOSIS — E1122 Type 2 diabetes mellitus with diabetic chronic kidney disease: Secondary | ICD-10-CM | POA: Diagnosis present

## 2019-09-08 DIAGNOSIS — E8779 Other fluid overload: Principal | ICD-10-CM | POA: Diagnosis present

## 2019-09-08 DIAGNOSIS — R531 Weakness: Secondary | ICD-10-CM | POA: Diagnosis not present

## 2019-09-08 DIAGNOSIS — N185 Chronic kidney disease, stage 5: Secondary | ICD-10-CM | POA: Diagnosis not present

## 2019-09-08 DIAGNOSIS — Z7984 Long term (current) use of oral hypoglycemic drugs: Secondary | ICD-10-CM

## 2019-09-08 DIAGNOSIS — R609 Edema, unspecified: Secondary | ICD-10-CM | POA: Diagnosis not present

## 2019-09-08 DIAGNOSIS — N186 End stage renal disease: Secondary | ICD-10-CM | POA: Diagnosis not present

## 2019-09-08 DIAGNOSIS — Z7982 Long term (current) use of aspirin: Secondary | ICD-10-CM

## 2019-09-08 DIAGNOSIS — R197 Diarrhea, unspecified: Secondary | ICD-10-CM | POA: Diagnosis present

## 2019-09-08 DIAGNOSIS — Z20828 Contact with and (suspected) exposure to other viral communicable diseases: Secondary | ICD-10-CM | POA: Diagnosis present

## 2019-09-08 DIAGNOSIS — J4520 Mild intermittent asthma, uncomplicated: Secondary | ICD-10-CM | POA: Diagnosis not present

## 2019-09-08 DIAGNOSIS — E119 Type 2 diabetes mellitus without complications: Secondary | ICD-10-CM

## 2019-09-08 HISTORY — DX: Chronic kidney disease, stage 3 unspecified: N18.30

## 2019-09-08 LAB — COMPREHENSIVE METABOLIC PANEL
ALT: 11 U/L (ref 0–44)
AST: 12 U/L — ABNORMAL LOW (ref 15–41)
Albumin: 2.4 g/dL — ABNORMAL LOW (ref 3.5–5.0)
Alkaline Phosphatase: 106 U/L (ref 38–126)
Anion gap: 12 (ref 5–15)
BUN: 57 mg/dL — ABNORMAL HIGH (ref 8–23)
CO2: 24 mmol/L (ref 22–32)
Calcium: 7.5 mg/dL — ABNORMAL LOW (ref 8.9–10.3)
Chloride: 101 mmol/L (ref 98–111)
Creatinine, Ser: 6.86 mg/dL — ABNORMAL HIGH (ref 0.61–1.24)
GFR calc Af Amer: 9 mL/min — ABNORMAL LOW (ref >60)
GFR calc non Af Amer: 8 mL/min — ABNORMAL LOW (ref >60)
Glucose, Bld: 192 mg/dL — ABNORMAL HIGH (ref 70–99)
Potassium: 2.8 mmol/L — ABNORMAL LOW (ref 3.5–5.1)
Sodium: 137 mmol/L (ref 135–145)
Total Bilirubin: 0.2 mg/dL — ABNORMAL LOW (ref 0.3–1.2)
Total Protein: 4.9 g/dL — ABNORMAL LOW (ref 6.5–8.1)

## 2019-09-08 LAB — CBC WITH DIFFERENTIAL/PLATELET
Abs Immature Granulocytes: 0.03 10*3/uL (ref 0.00–0.07)
Basophils Absolute: 0.1 10*3/uL (ref 0.0–0.1)
Basophils Relative: 1 %
Eosinophils Absolute: 1.6 10*3/uL — ABNORMAL HIGH (ref 0.0–0.5)
Eosinophils Relative: 17 %
HCT: 26.3 % — ABNORMAL LOW (ref 39.0–52.0)
Hemoglobin: 9.2 g/dL — ABNORMAL LOW (ref 13.0–17.0)
Immature Granulocytes: 0 %
Lymphocytes Relative: 15 %
Lymphs Abs: 1.4 10*3/uL (ref 0.7–4.0)
MCH: 30.5 pg (ref 26.0–34.0)
MCHC: 35 g/dL (ref 30.0–36.0)
MCV: 87.1 fL (ref 80.0–100.0)
Monocytes Absolute: 0.6 10*3/uL (ref 0.1–1.0)
Monocytes Relative: 6 %
Neutro Abs: 5.8 10*3/uL (ref 1.7–7.7)
Neutrophils Relative %: 61 %
Platelets: 243 10*3/uL (ref 150–400)
RBC: 3.02 MIL/uL — ABNORMAL LOW (ref 4.22–5.81)
RDW: 13.6 % (ref 11.5–15.5)
WBC: 9.5 10*3/uL (ref 4.0–10.5)
nRBC: 0 % (ref 0.0–0.2)

## 2019-09-08 LAB — I-STAT CHEM 8, ED
BUN: 52 mg/dL — ABNORMAL HIGH (ref 8–23)
Calcium, Ion: 0.98 mmol/L — ABNORMAL LOW (ref 1.15–1.40)
Chloride: 100 mmol/L (ref 98–111)
Creatinine, Ser: 7.1 mg/dL — ABNORMAL HIGH (ref 0.61–1.24)
Glucose, Bld: 197 mg/dL — ABNORMAL HIGH (ref 70–99)
HCT: 26 % — ABNORMAL LOW (ref 39.0–52.0)
Hemoglobin: 8.8 g/dL — ABNORMAL LOW (ref 13.0–17.0)
Potassium: 3.2 mmol/L — ABNORMAL LOW (ref 3.5–5.1)
Sodium: 138 mmol/L (ref 135–145)
TCO2: 24 mmol/L (ref 22–32)

## 2019-09-08 LAB — BRAIN NATRIURETIC PEPTIDE: B Natriuretic Peptide: 438.8 pg/mL — ABNORMAL HIGH (ref 0.0–100.0)

## 2019-09-08 LAB — SARS CORONAVIRUS 2 BY RT PCR (HOSPITAL ORDER, PERFORMED IN ~~LOC~~ HOSPITAL LAB): SARS Coronavirus 2: NEGATIVE

## 2019-09-08 IMAGING — DX DG CHEST 2V
2 series · 2 of 2 positions shown · non-contrast
Comparison: CT chest dated [DATE]

CLINICAL DATA: Bilateral lower extremity swelling x2 months

EXAM:
CHEST - 2 VIEW

[chest pa]
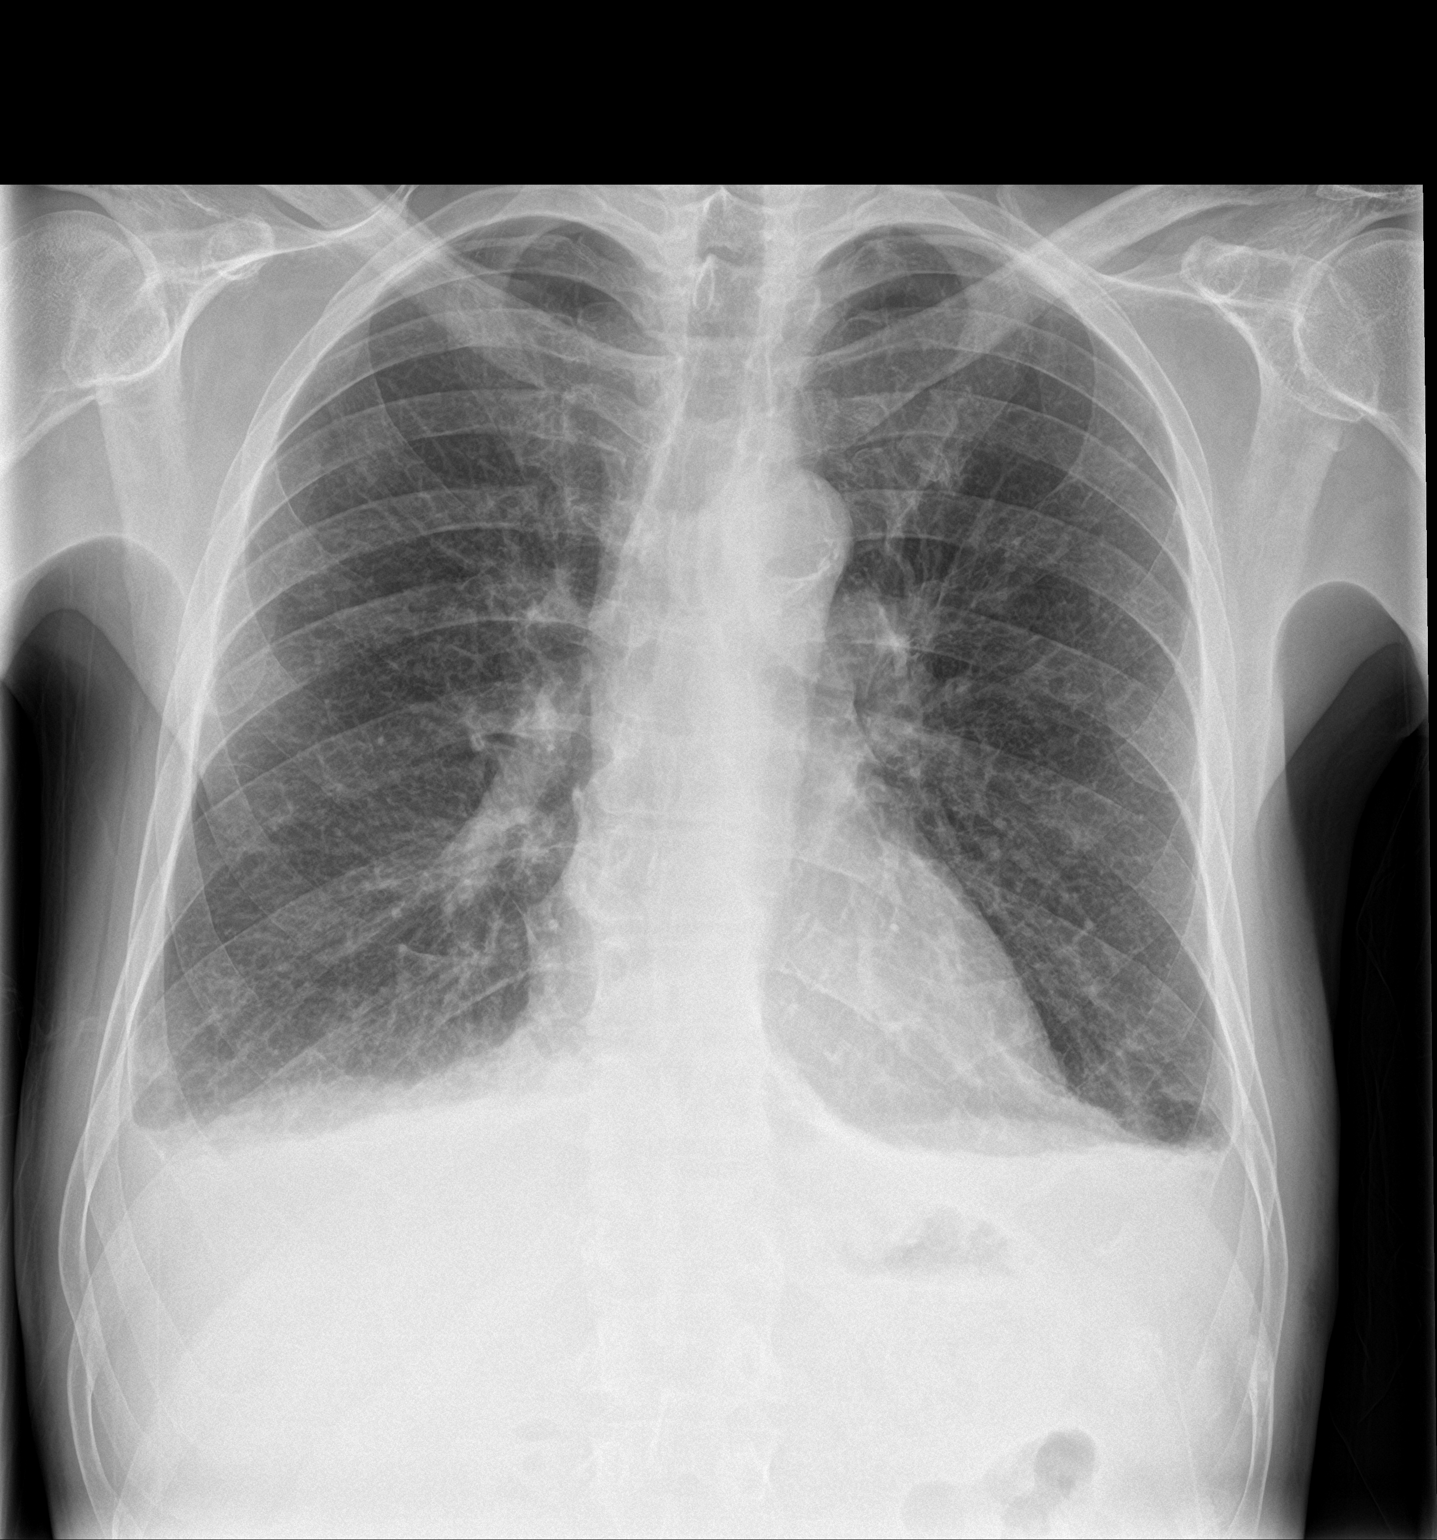

[chest lat]
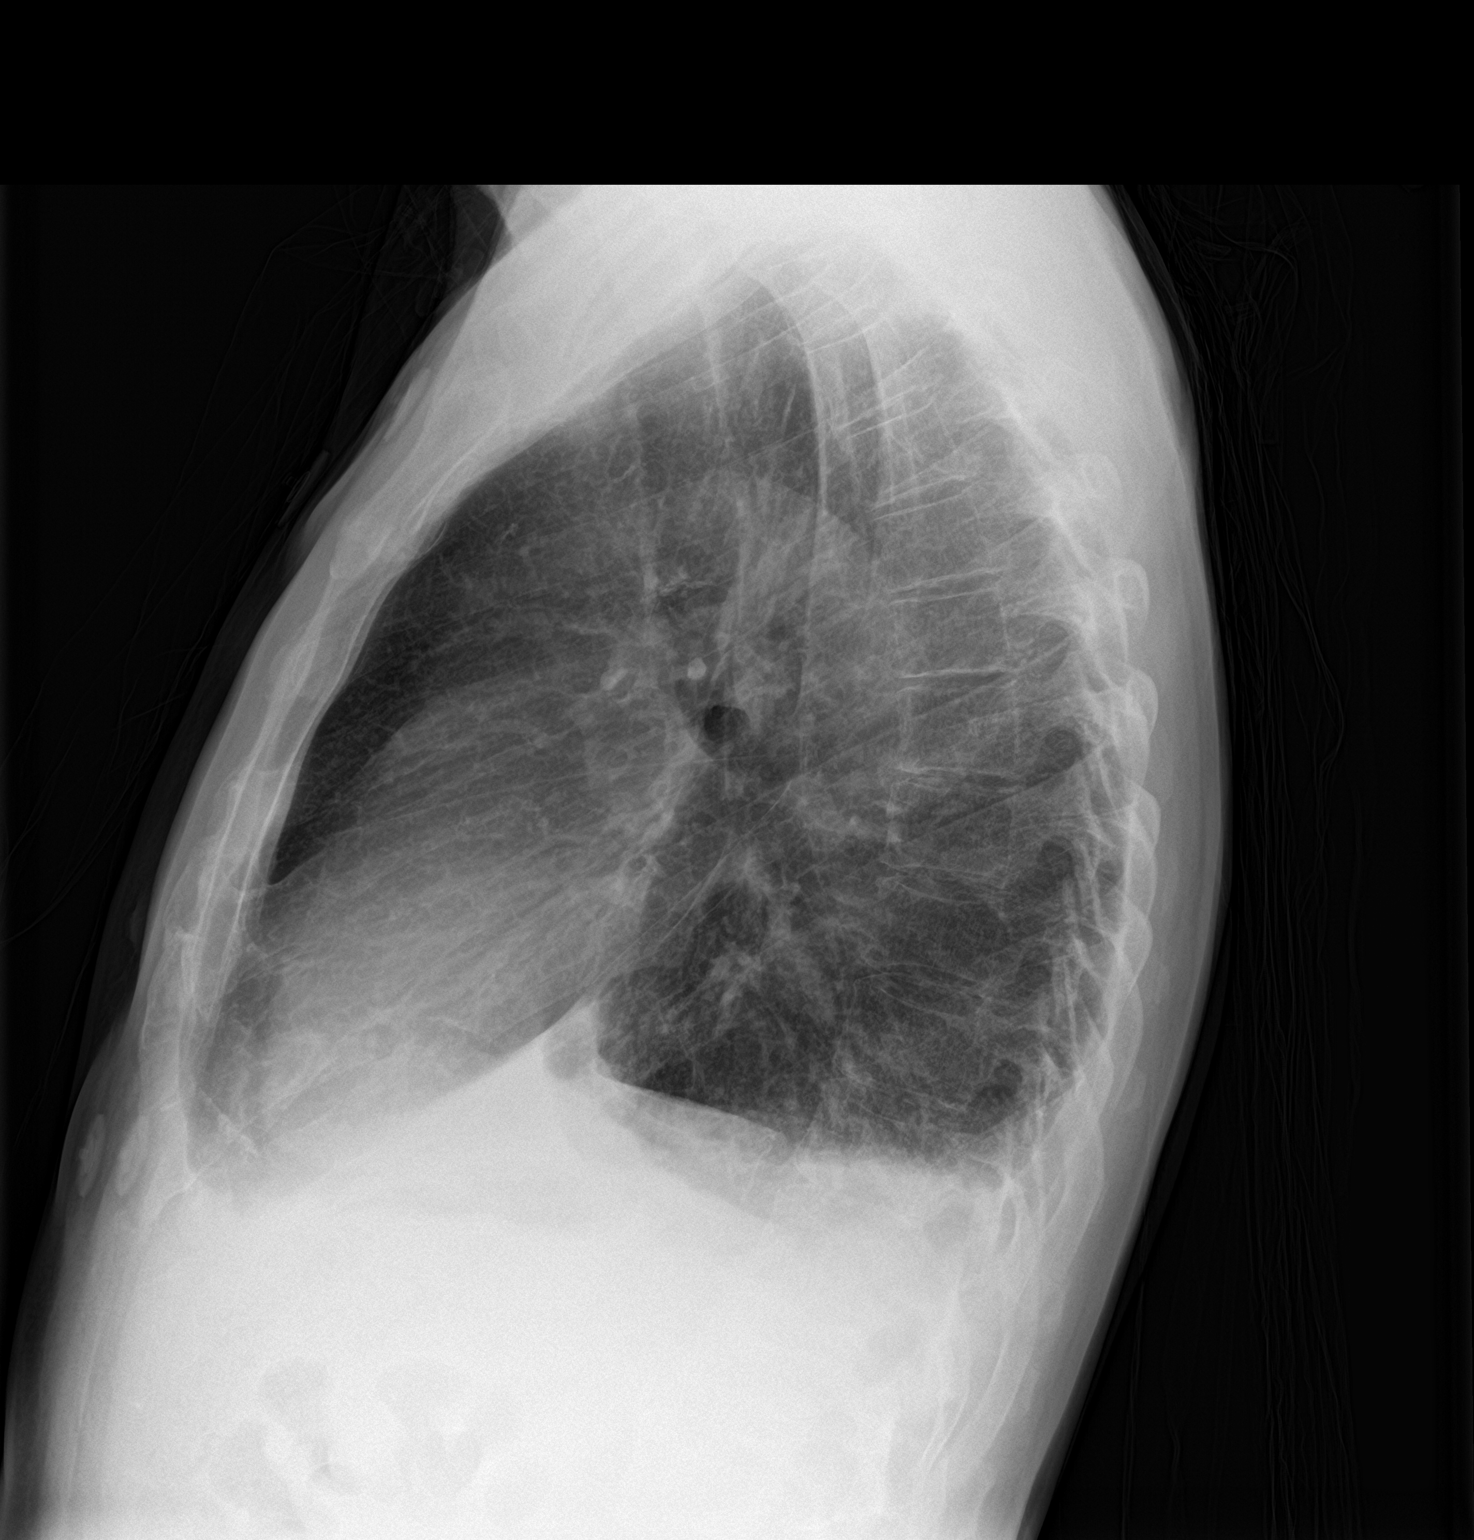

[2 of 2 positions shown; findings below may reference images not displayed]

FINDINGS: Small bilateral pleural effusions.  No frank interstitial edema.

Mild bibasilar opacities, likely atelectasis.

No pneumothorax.

The heart is normal in size.  Thoracic aortic atherosclerosis.

Visualized osseous structures are within normal limits.
IMPRESSION: Small bilateral pleural effusions.  No frank interstitial edema.

## 2019-09-08 IMAGING — US US RENAL
1 series · 14 of 25 positions shown · non-contrast
Comparison: [DATE]

CLINICAL DATA: Chronic kidney disease

EXAM:
RENAL / URINARY TRACT ULTRASOUND COMPLETE

[Series 1: us renal · 14 of 32 slices shown]
[im 1/32]
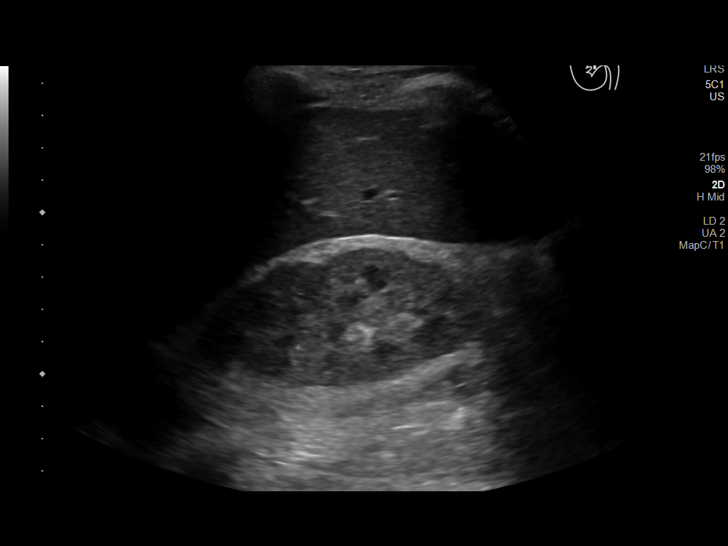
[im 3/32]
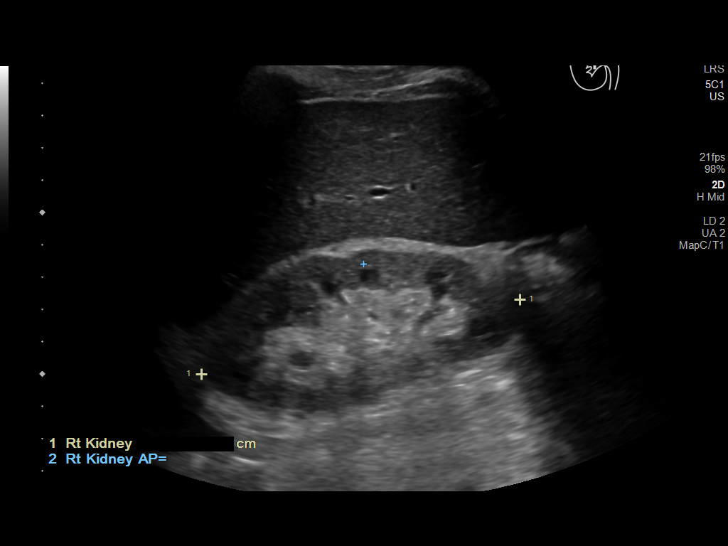
[im 6/32]
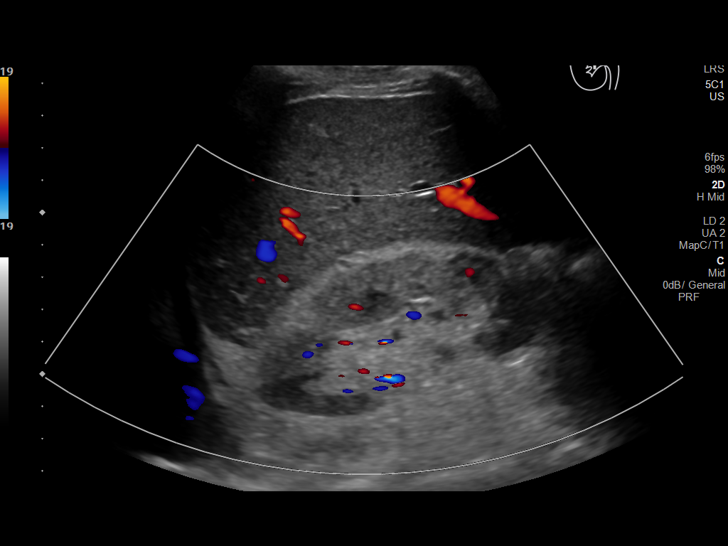
[im 8/32]
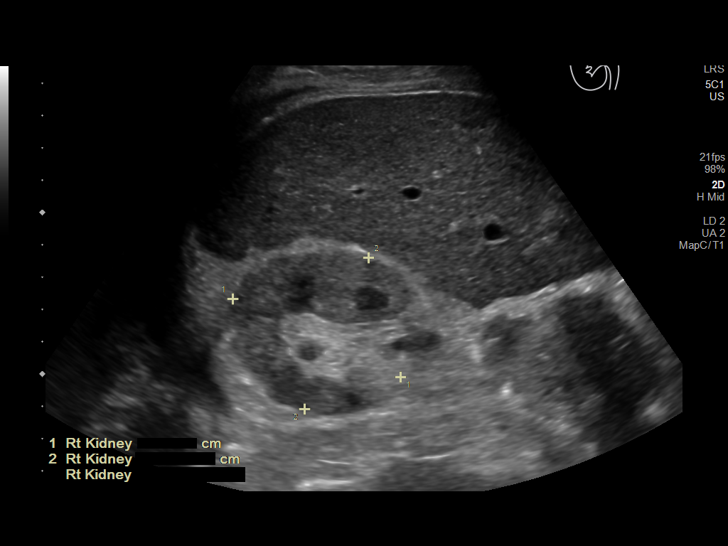
[im 11/32]
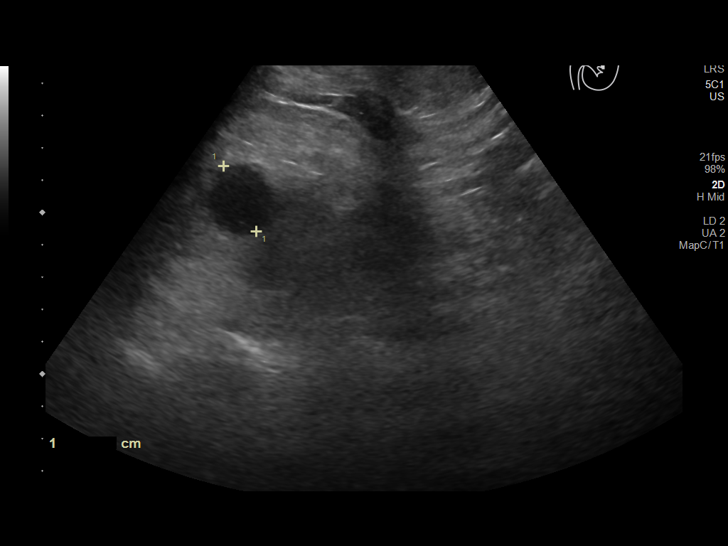
[im 12/32]
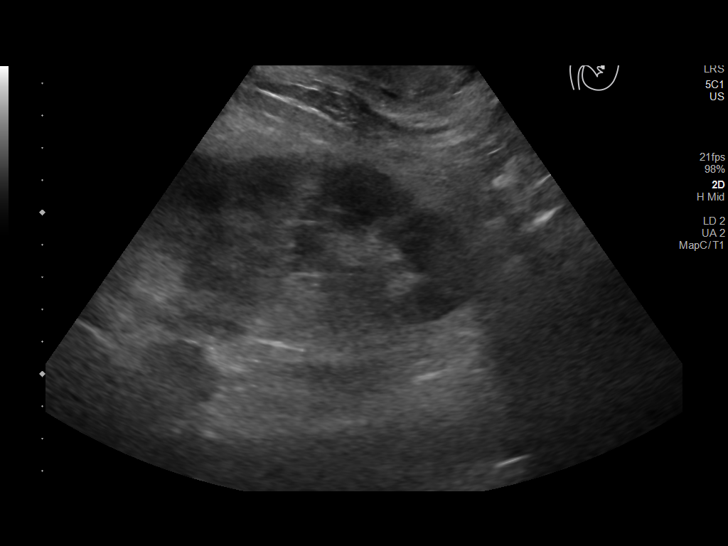
[im 15/32]
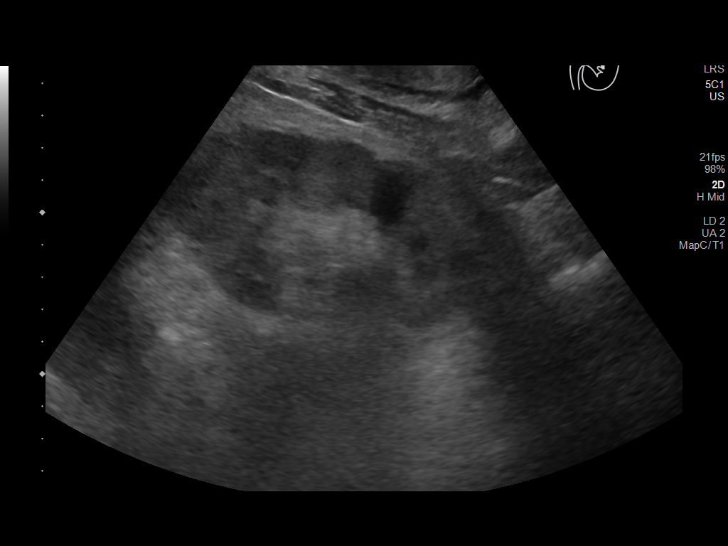
[im 17/32]
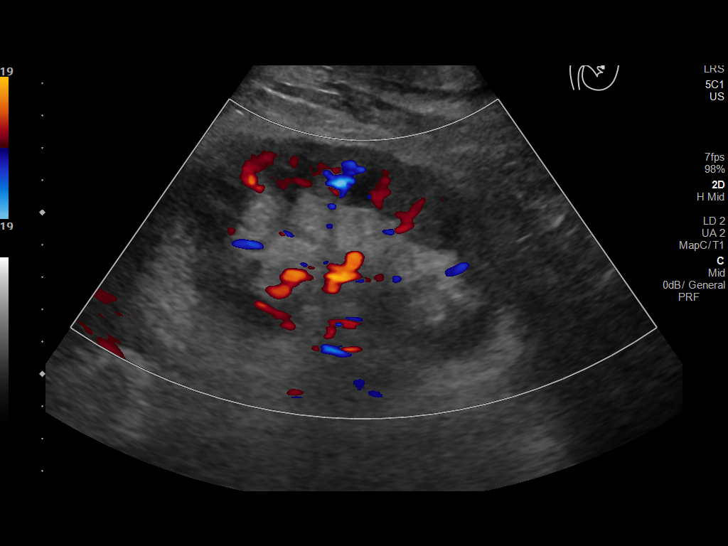
[im 20/32]
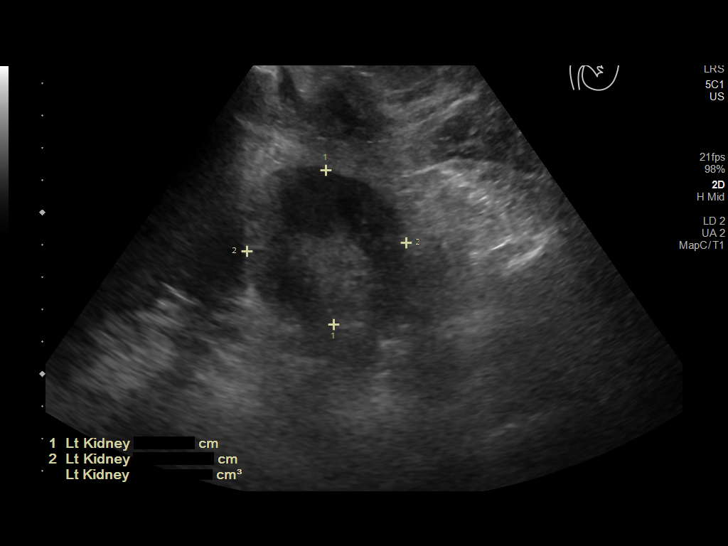
[im 21/32]
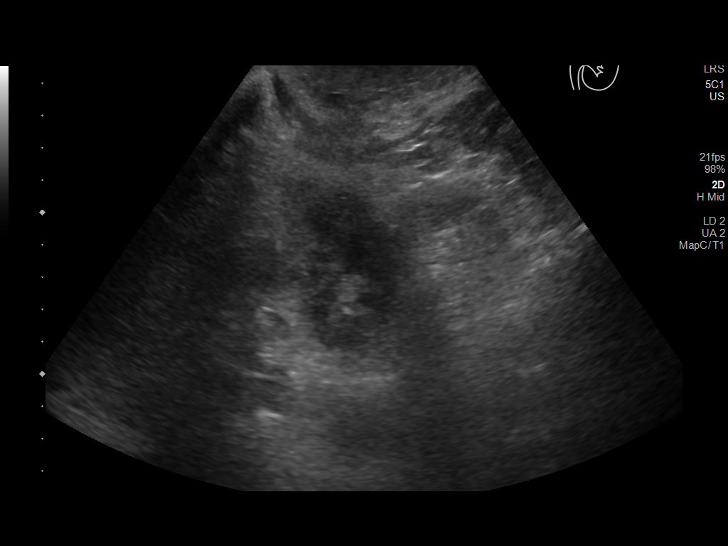
[im 24/32]
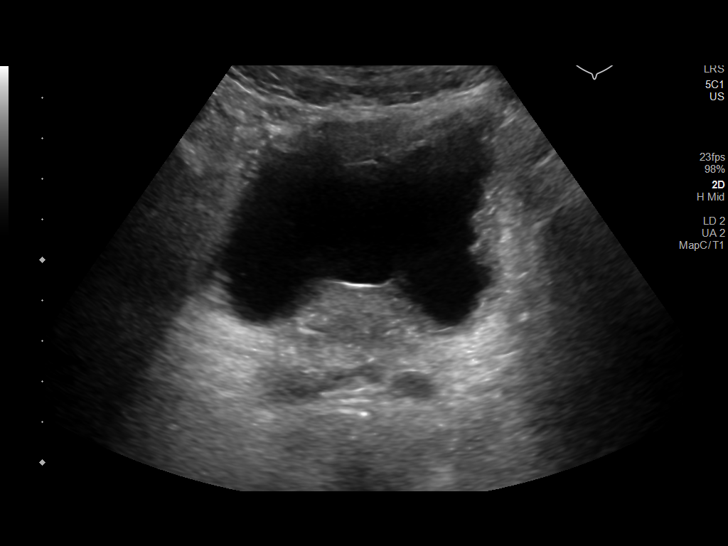
[im 26/32]
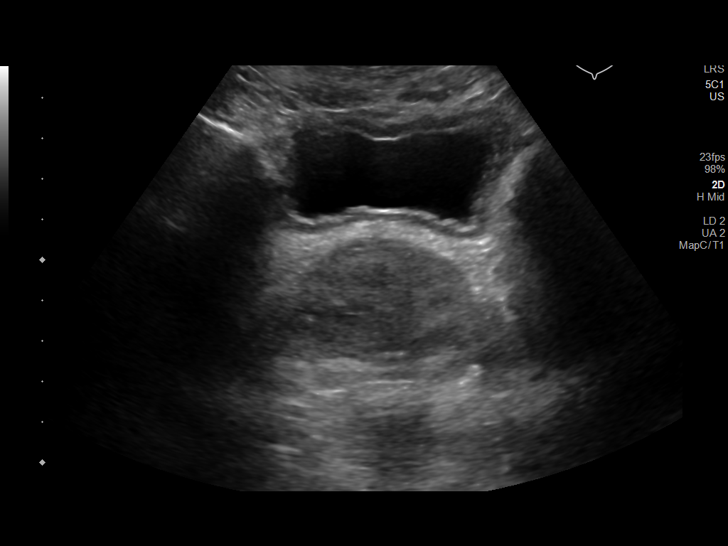
[im 29/32]
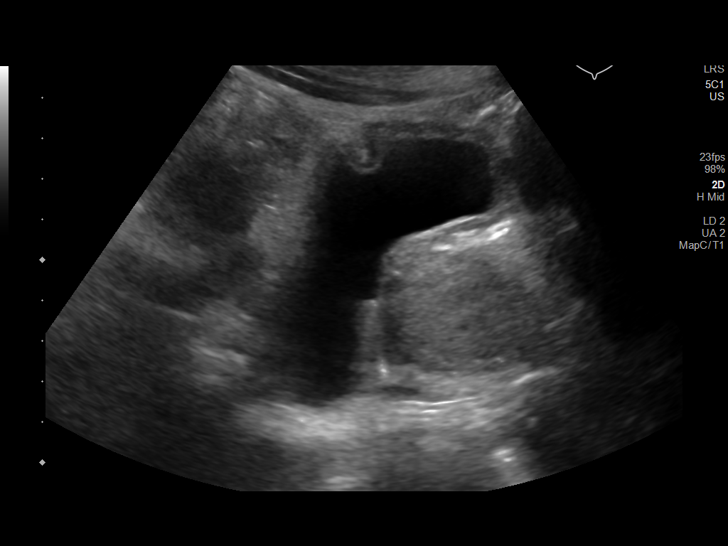
[im 32/32]
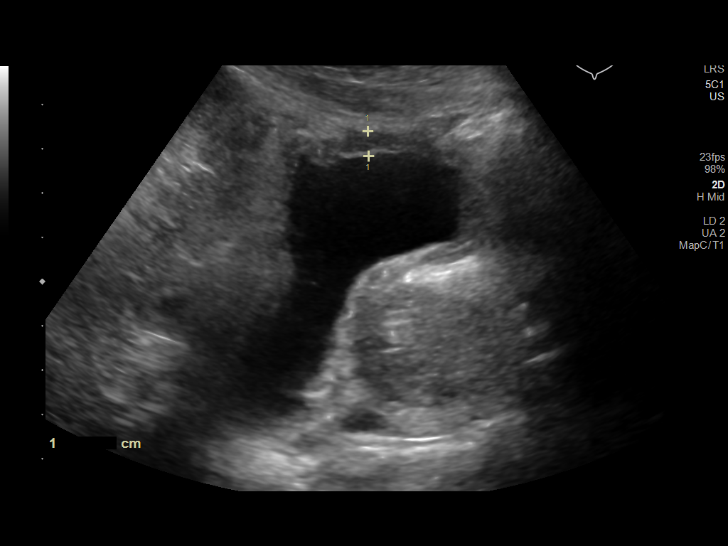

[14 of 25 positions shown; findings below may reference images not displayed]

FINDINGS: Right Kidney:

Renal measurements: 10.1 x 5.7 x 5.1 cm = volume: 155 mL. Mildly
increased echotexture diffusely. No mass or hydronephrosis.

Left Kidney:

Renal measurements: 10.3 x 4.8 x 4.9 cm = volume: 127 mL. Diffusely
increased echotexture. No hydronephrosis. 2.7 cm cyst in the upper
pole. No hydronephrosis.

Bladder:

Diffusely thickened irregular bladder wall.
IMPRESSION: Increased echotexture in the kidneys bilaterally compatible with
chronic medical renal disease. No hydronephrosis.

Thickened, irregular bladder wall. While this could be related to
cystitis, infiltrating malignancy cannot be excluded. Recommend
correlation urinalysis and possible cystoscopy if felt clinically
indicated.

## 2019-09-08 MED ORDER — INSULIN ASPART 100 UNIT/ML ~~LOC~~ SOLN
0.0000 [IU] | Freq: Every day | SUBCUTANEOUS | Status: DC
  Administered 2019-09-09 – 2019-09-10 (×2): 2 [IU] via SUBCUTANEOUS

## 2019-09-08 MED ORDER — ACETAMINOPHEN 650 MG RE SUPP: 650.0000 mg | Freq: Four times a day (QID) | RECTAL | Status: DC | PRN

## 2019-09-08 MED ORDER — INSULIN ASPART 100 UNIT/ML ~~LOC~~ SOLN
0.0000 [IU] | Freq: Three times a day (TID) | SUBCUTANEOUS | Status: DC
  Administered 2019-09-09: 3 [IU] via SUBCUTANEOUS
  Administered 2019-09-09: 1 [IU] via SUBCUTANEOUS
  Administered 2019-09-09 – 2019-09-10 (×2): 2 [IU] via SUBCUTANEOUS
  Administered 2019-09-10: 1 [IU] via SUBCUTANEOUS
  Administered 2019-09-11 (×2): 3 [IU] via SUBCUTANEOUS
  Administered 2019-09-11: 14:00:00 1 [IU] via SUBCUTANEOUS
  Administered 2019-09-12 (×2): 2 [IU] via SUBCUTANEOUS

## 2019-09-08 MED ORDER — POTASSIUM CHLORIDE 10 MEQ/100ML IV SOLN
10.0000 meq | Freq: Once | INTRAVENOUS | Status: AC
  Administered 2019-09-08: 10 meq via INTRAVENOUS
  Filled 2019-09-08: qty 100

## 2019-09-08 MED ORDER — FUROSEMIDE 10 MG/ML IJ SOLN
160.0000 mg | Freq: Once | INTRAVENOUS | Status: AC
  Administered 2019-09-08: 160 mg via INTRAVENOUS
  Filled 2019-09-08: qty 10

## 2019-09-08 MED ORDER — PANTOPRAZOLE SODIUM 40 MG PO TBEC
40.0000 mg | DELAYED_RELEASE_TABLET | Freq: Every day | ORAL | Status: DC
  Administered 2019-09-09: 40 mg via ORAL
  Filled 2019-09-08: qty 1

## 2019-09-08 MED ORDER — POTASSIUM CHLORIDE CRYS ER 20 MEQ PO TBCR
20.0000 meq | EXTENDED_RELEASE_TABLET | Freq: Once | ORAL | Status: AC
  Administered 2019-09-09: 20 meq via ORAL
  Filled 2019-09-08: qty 1

## 2019-09-08 MED ORDER — HEPARIN SODIUM (PORCINE) 5000 UNIT/ML IJ SOLN: 5000.0000 [IU] | Freq: Three times a day (TID) | INTRAMUSCULAR | Status: DC

## 2019-09-08 MED ORDER — POTASSIUM CHLORIDE CRYS ER 20 MEQ PO TBCR
40.0000 meq | EXTENDED_RELEASE_TABLET | Freq: Once | ORAL | Status: AC
  Administered 2019-09-08: 40 meq via ORAL
  Filled 2019-09-08: qty 2

## 2019-09-08 MED ORDER — ACETAMINOPHEN 325 MG PO TABS
650.0000 mg | ORAL_TABLET | Freq: Four times a day (QID) | ORAL | Status: DC | PRN
  Filled 2019-09-08 (×2): qty 2

## 2019-09-08 NOTE — ED Notes (Signed)
PT updated on wait for treatment room.

## 2019-09-08 NOTE — ED Triage Notes (Signed)
Pt states that he has had increased ankle swelling over the past month and was sent to ED by nephrology for admit. Significant pitting edema noted to both ankles. Pt denies SOB at this time. States he has pain with ambulation due to foot pressure.

## 2019-09-08 NOTE — ED Notes (Signed)
Bladder scan 188 mL

## 2019-09-08 NOTE — ED Notes (Signed)
Patient transported to Ultrasound 

## 2019-09-08 NOTE — ED Provider Notes (Signed)
[facility] EMERGENCY DEPARTMENT Provider Note   CSN: [id number] Arrival date & time: 09/08/19  1610     History   Chief Complaint Chief Complaint  Patient presents with  . Leg Swelling    HPI Duane Hall is a 63 y.o. male hx of DM, here with leg swelling, renal failure.  He states that over the last several months, he has been progressively having leg swelling and weakness .  Denies any chest pain or shortness of breath.  He went to see his nephrologist earlier today and had labs drawn and was told that his kidney function was only 8% and his potassium and calcium were low.  Denies any chest pain or shortness of breath. Patient was just started on bumex and potassium.      The history is provided by the patient.    Past Medical History:  Diagnosis Date  . Asthma   . Cancer ([place])   . Cataract   . Diabetes mellitus   . Glaucoma     Patient Active Problem List   Diagnosis Date Noted  . Diabetic retinopathy ([place]) 08/25/2015  . Claudication ([place]) 04/07/2015  . Anemia of chronic disease 09/12/2014  . Chronic renal impairment 08/03/2014  . Hepatitis C 10/15/2013  . Pain in joint, shoulder region 02/10/2013  . Type II diabetes mellitus ([place]) 02/24/2012  . Hypertension 02/24/2012  . Asthma 02/24/2012    No past surgical history on file.      Home Medications    Prior to Admission medications   Medication Sig Start Date End Date Taking? Authorizing Provider  amoxicillin-clavulanate (AUGMENTIN) 875-125 MG tablet Take 1 tablet by mouth 2 (two) times daily. One po bid x 7 days 10/23/18   Jacqlyn Larsen, PA-C  atorvastatin (LIPITOR) 20 MG tablet Take 20 mg by mouth daily.    [provider]  Ca Carbonate-Mag Hydroxide (ROLAIDS EXTRA STRENGTH) 675-135 MG CHEW Chew 1-2 tablets by mouth as needed (for sour stomach or indigestion).     [provider]  carvedilol (COREG) 6.25 MG tablet Take 6.25 mg by mouth 2 (two) times daily with a  meal.    [provider]  cefdinir (OMNICEF) 300 MG capsule Take 300 mg by mouth 2 (two) times daily. 10/20/18   [provider]  doxycycline (VIBRAMYCIN) 100 MG capsule Take 1 capsule (100 mg total) by mouth 2 (two) times daily. One po bid x 7 days 10/23/18   Jacqlyn Larsen, PA-C  Dulaglutide (TRULICITY) 1.5 PI/9.5JO SOPN Inject into the skin.    [provider]  glipiZIDE (GLUCOTROL XL) 10 MG 24 hr tablet Take 10 mg by mouth daily with breakfast.    [provider]  Lancet Devices (AUTO-LANCETS) MISC 100 Units by Does not apply route every morning. 02/24/12   Robyn Haber, MD  losartan (COZAAR) 50 MG tablet TAKE 1 TABLET BY MOUTH EVERY DAY Patient not taking: Reported on 10/22/2018 11/14/13   Theda Sers, PA-C  oxymetazoline (AFRIN) 0.05 % nasal spray Place 1-2 sprays into both nostrils 2 (two) times daily as needed for congestion.    [provider]  pregabalin (LYRICA) 75 MG capsule Take 75 mg by mouth 2 (two) times daily.    [provider]  sitaGLIPtin (JANUVIA) 50 MG tablet Take 1 tablet (50 mg total) by mouth daily. Patient not taking: Reported on 10/22/2018 11/10/15   Copland, Gay Filler, MD  traMADol (ULTRAM) 50 MG tablet Take 1 tablet (50 mg  total) by mouth every 8 (eight) hours as needed for pain. Patient not taking: Reported on 10/22/2018 09/07/13   Copland, Gay Filler, MD    Family History Family History  Problem Relation Age of Onset  . Cancer Father     Social History Social History   Tobacco Use  . Smoking status: Current Every Day Smoker    Packs/day: 0.80    Types: Cigarettes  . Smokeless tobacco: Never Used  Substance Use Topics  . Alcohol use: No    Alcohol/week: 0.0 standard drinks  . Drug use: No     Allergies   Patient has no known allergies.   Review of Systems Review of Systems  Cardiovascular: Positive for leg swelling.  Neurological: Positive for weakness.  All other systems reviewed and  are negative.    Physical Exam Updated Vital Signs BP 136/73 (BP Location: Left Arm)   Pulse 93   Temp 98.5 F (36.9 C) (Oral)   Resp 16   SpO2 99%   Physical Exam Vitals signs and nursing note reviewed.  HENT:     Head: Normocephalic.     Nose: Nose normal.     Mouth/Throat:     Mouth: Mucous membranes are moist.  Eyes:     Extraocular Movements: Extraocular movements intact.     Pupils: Pupils are equal, round, and reactive to light.  Neck:     Musculoskeletal: Normal range of motion.  Cardiovascular:     Rate and Rhythm: Normal rate and regular rhythm.     Pulses: Normal pulses.     Heart sounds: Normal heart sounds.  Pulmonary:     Effort: Pulmonary effort is normal.     Breath sounds: Normal breath sounds.  Abdominal:     General: Abdomen is flat.  Musculoskeletal:     Comments: 2+ edema bilaterally   Skin:    General: Skin is warm.     Capillary Refill: Capillary refill takes less than 2 seconds.  Neurological:     General: No focal deficit present.     Mental Status: He is alert.  Psychiatric:        Mood and Affect: Mood normal.        Behavior: Behavior normal.      ED Treatments / Results  Labs (all labs ordered are listed, but only abnormal results are displayed) Labs Reviewed  COMPREHENSIVE METABOLIC PANEL - Abnormal; Notable for the following components:      Result Value   Potassium 2.8 (*)    Glucose, Bld 192 (*)    BUN 57 (*)    Creatinine, Ser 6.86 (*)    Calcium 7.5 (*)    Total Protein 4.9 (*)    Albumin 2.4 (*)    AST 12 (*)    Total Bilirubin 0.2 (*)    GFR calc non Af Amer 8 (*)    GFR calc Af Amer 9 (*)    All other components within normal limits  CBC WITH DIFFERENTIAL/PLATELET - Abnormal; Notable for the following components:   RBC 3.02 (*)    Hemoglobin 9.2 (*)    HCT 26.3 (*)    Eosinophils Absolute 1.6 (*)    All other components within normal limits  BRAIN NATRIURETIC PEPTIDE - Abnormal; Notable for the following  components:   B Natriuretic Peptide 438.8 (*)    All other components within normal limits  I-STAT CHEM 8, ED    EKG None  Radiology Dg Chest 2 View  Result  Date: 09/08/2019 CLINICAL DATA:  Bilateral lower extremity swelling x2 months EXAM: CHEST - 2 VIEW COMPARISON:  CT chest dated 02/24/2019 FINDINGS: Small bilateral pleural effusions.  No [place] interstitial edema. Mild bibasilar opacities, likely atelectasis. No pneumothorax. The heart is normal in size.  Thoracic aortic atherosclerosis. Visualized osseous structures are within normal limits. IMPRESSION: Small bilateral pleural effusions.  No Dencil interstitial edema. Electronically Signed   By: Julian Hy M.D.   On: 09/08/2019 18:06    Procedures Procedures (including critical care time)  Medications Ordered in ED Medications  potassium chloride SA (K-DUR) CR tablet 40 mEq (has no administration in time range)  potassium chloride 10 mEq in 100 mL IVPB (has no administration in time range)  furosemide (LASIX) 160 mg in dextrose 5 % 50 mL IVPB (has no administration in time range)     Initial Impression / Assessment and Plan / ED Course  I have reviewed the triage vital signs and the nursing notes.  Pertinent labs & imaging results that were available during my care of the patient were reviewed by me and considered in my medical decision making (see chart for details).       Duane Hall is a 63 y.o. male here with weakness, leg swelling. Appears volume overloaded. GFR is 8% per patient. Also hypokalemic and hypocalcemic. This has been progressively worsening over the last few months. He is scheduled for renal biopsy later this month but is sent in for workup. Will repeat labs and consult nephrology   9:21 PM Repeat labs showed Cr 6.8. K 2.8. Calcium is 7.5 but albumin low so corrected calcium around 9. I talked to Dr. Louie Boston from nephrology. She recommend lasix 160 mg IV x 1 and IV potassium. Medicine to admit  and nephrology will consult in AM.    Final Clinical Impressions(s) / ED Diagnoses   Final diagnoses:  None    ED Discharge Orders    None       Drenda Freeze, MD 09/08/19 2200

## 2019-09-08 NOTE — H&P (Signed)
History and Physical    Duane Hall:[id number] DOB: [birth year] DOA: 09/08/2019  PCP: Heywood Bene, PA-C Patient coming from: Home  Chief Complaint: Bilateral lower extremity edema  HPI: Duane Hall is a 63 y.o. male with medical history significant of CKD stage III, asthma, type 2 diabetes presenting to the hospital for evaluation of bilateral lower extremity edema.  Patient was seen earlier today by his nephrologist and sent to the hospital for evaluation of worsening renal function.  Patient reports 6-month history of progressively worsening bilateral lower extremity edema.  He has been feeling tired.  States he was seen by his nephrologist about a week ago and told his kidney function was declining.  He was scheduled for a renal biopsy next week.  Today when his nephrologist repeated labs they were concerned about his renal function and told him he only at 8% kidney function left.  He was advised to come into the hospital.  Reports having chronic intermittent nausea/ vomiting related to problems with his stomach.  Reports having chronically low appetite, no recent change.  Denies shortness of breath.  States he has a chronic cough due to postnasal drip from chronic sinus problems.  Denies chest pain.  No other complaints.  ED Course: Hemodynamically stable.  No leukocytosis.  Hemoglobin 9.2, was 11.1 in October 2019.  MCV normal.  BNP 438.  Potassium 2.8.  BUN 57, creatinine 6.8, GFR 8.  Creatinine was 2.0 and GFR 33 in October 2019.  SARS-CoV-2 test pending.  Chest x-ray showing small bilateral pleural effusions and no Izaia interstitial edema.  Nephrology was consulted by ED provider.  Patient received IV Lasix 160 mg and potassium supplementation.  Review of Systems:  All systems reviewed and apart from history of presenting illness, are negative.  Past Medical History:  Diagnosis Date  . Asthma   . Cancer ([place])   . Cataract   . Diabetes mellitus   . Glaucoma     No  past surgical history on file.   reports that he has been smoking cigarettes. He has been smoking about 0.80 packs per day. He has never used smokeless tobacco. He reports that he does not drink alcohol or use drugs.  No Known Allergies  Family History  Problem Relation Age of Onset  . Cancer Father     Prior to Admission medications   Medication Sig Start Date End Date Taking? Authorizing Provider  aspirin EC 81 MG tablet Take 81 mg by mouth as needed for mild pain.   Yes [provider]  bumetanide (BUMEX) 2 MG tablet Take 4 mg by mouth 2 (two) times daily.   Yes [provider]  glipiZIDE (GLUCOTROL XL) 10 MG 24 hr tablet Take 10 mg by mouth daily with breakfast.   Yes [provider]  omeprazole (PRILOSEC) 40 MG capsule Take 40 mg by mouth daily.   Yes [provider]  Vitamin D, Ergocalciferol, (DRISDOL) 1.25 MG (50000 UT) CAPS capsule Take 1 capsule by mouth once a week. Sundays 02/17/19  Yes [provider]  amoxicillin-clavulanate (AUGMENTIN) 875-125 MG tablet Take 1 tablet by mouth 2 (two) times daily. One po bid x 7 days Patient not taking: Reported on 09/08/2019 10/23/18   Jacqlyn Larsen, PA-C  doxycycline (VIBRAMYCIN) 100 MG capsule Take 1 capsule (100 mg total) by mouth 2 (two) times daily. One po bid x 7 days Patient not taking: Reported on 09/08/2019 10/23/18   Jacqlyn Larsen, PA-C  Lancet  Devices (AUTO-LANCETS) MISC 100 Units by Does not apply route every morning. Patient not taking: Reported on 09/08/2019 02/24/12   Robyn Haber, MD  losartan (COZAAR) 50 MG tablet TAKE 1 TABLET BY MOUTH EVERY DAY Patient not taking: Reported on 09/08/2019 11/14/13   Theda Sers, PA-C  sitaGLIPtin (JANUVIA) 50 MG tablet Take 1 tablet (50 mg total) by mouth daily. Patient not taking: Reported on 09/08/2019 11/10/15   Copland, Gay Filler, MD  traMADol (ULTRAM) 50 MG tablet Take 1 tablet (50 mg total) by mouth every 8 (eight) hours as needed for pain.  Patient not taking: Reported on 09/08/2019 09/07/13   Darreld Mclean, MD    Physical Exam: Vitals:   09/08/19 2245 09/08/19 2300 09/08/19 2315 09/09/19 0100  BP: (!) 162/79 (!) 163/86 (!) 160/83   Pulse:    89  Resp: 20 (!) 21 (!) 21 (!) 21  Temp:      TempSrc:      SpO2:    96%    Physical Exam  Constitutional: He is oriented to person, place, and time. No distress.  HENT:  Head: Normocephalic.  Mouth/Throat: Oropharynx is clear and moist.  Eyes: Right eye exhibits no discharge. Left eye exhibits no discharge.  Neck: Neck supple.  Cardiovascular: Normal rate, regular rhythm and intact distal pulses.  Pulmonary/Chest: Effort normal and breath sounds normal. No respiratory distress. He has no wheezes. He has no rales.  Abdominal: Soft. Bowel sounds are normal. He exhibits no distension. There is no abdominal tenderness. There is no guarding.  Musculoskeletal:        General: Edema present.     Comments: +4 pitting edema of bilateral lower extremities  Neurological: He is alert and oriented to person, place, and time.  Skin: Skin is warm and dry. He is not diaphoretic.     Labs on Admission: I have personally reviewed following labs and imaging studies  CBC: Recent Labs  Lab 09/08/19 1710 09/08/19 2140  WBC 9.5  --   NEUTROABS 5.8  --   HGB 9.2* 8.8*  HCT 26.3* 26.0*  MCV 87.1  --   PLT 243  --    Basic Metabolic Panel: Recent Labs  Lab 09/08/19 1710 09/08/19 2140  NA 137 138  K 2.8* 3.2*  CL 101 100  CO2 24  --   GLUCOSE 192* 197*  BUN 57* 52*  CREATININE 6.86* 7.10*  CALCIUM 7.5*  --    GFR: CrCl cannot be calculated (Unknown ideal weight.). Liver Function Tests: Recent Labs  Lab 09/08/19 1710  AST 12*  ALT 11  ALKPHOS 106  BILITOT 0.2*  PROT 4.9*  ALBUMIN 2.4*   No results for input(s): LIPASE, AMYLASE in the last 168 hours. No results for input(s): AMMONIA in the last 168 hours. Coagulation Profile: No results for input(s): INR, PROTIME  in the last 168 hours. Cardiac Enzymes: No results for input(s): CKTOTAL, CKMB, CKMBINDEX, TROPONINI in the last 168 hours. BNP (last 3 results) No results for input(s): PROBNP in the last 8760 hours. HbA1C: No results for input(s): HGBA1C in the last 72 hours. CBG: Recent Labs  Lab 09/09/19 0041  GLUCAP 237*   Lipid Profile: No results for input(s): CHOL, HDL, LDLCALC, TRIG, CHOLHDL, LDLDIRECT in the last 72 hours. Thyroid Function Tests: No results for input(s): TSH, T4TOTAL, FREET4, T3FREE, THYROIDAB in the last 72 hours. Anemia Panel: No results for input(s): VITAMINB12, FOLATE, FERRITIN, TIBC, IRON, RETICCTPCT in the last 72 hours. Urine analysis:  Component Value Date/Time   COLORURINE YELLOW 10/22/2018 1509   APPEARANCEUR HAZY (A) 10/22/2018 1509   LABSPEC 1.020 10/22/2018 1509   PHURINE 5.0 10/22/2018 1509   GLUCOSEU >=500 (A) 10/22/2018 1509   HGBUR SMALL (A) 10/22/2018 1509   BILIRUBINUR NEGATIVE 10/22/2018 1509   KETONESUR 5 (A) 10/22/2018 1509   PROTEINUR 100 (A) 10/22/2018 1509   NITRITE NEGATIVE 10/22/2018 1509   LEUKOCYTESUR NEGATIVE 10/22/2018 1509    Radiological Exams on Admission: Dg Chest 2 View  Result Date: 09/08/2019 CLINICAL DATA:  Bilateral lower extremity swelling x2 months EXAM: CHEST - 2 VIEW COMPARISON:  CT chest dated 02/24/2019 FINDINGS: Small bilateral pleural effusions.  No Darey interstitial edema. Mild bibasilar opacities, likely atelectasis. No pneumothorax. The heart is normal in size.  Thoracic aortic atherosclerosis. Visualized osseous structures are within normal limits. IMPRESSION: Small bilateral pleural effusions.  No Miguel interstitial edema. Electronically Signed   By: Julian Hy M.D.   On: 09/08/2019 18:06   US Renal  Result Date: 09/08/2019 CLINICAL DATA:  Chronic kidney disease EXAM: RENAL / URINARY TRACT ULTRASOUND COMPLETE COMPARISON:  10/08/2013 FINDINGS: Right Kidney: Renal measurements: 10.1 x 5.7 x 5.1 cm = volume:  155 mL. Mildly increased echotexture diffusely. No mass or hydronephrosis. Left Kidney: Renal measurements: 10.3 x 4.8 x 4.9 cm = volume: 127 mL. Diffusely increased echotexture. No hydronephrosis. 2.7 cm cyst in the upper pole. No hydronephrosis. Bladder: Diffusely thickened irregular bladder wall. IMPRESSION: Increased echotexture in the kidneys bilaterally compatible with chronic medical renal disease. No hydronephrosis. Thickened, irregular bladder wall. While this could be related to cystitis, infiltrating malignancy cannot be excluded. Recommend correlation urinalysis and possible cystoscopy if felt clinically indicated. Electronically Signed   By: Rolm Baptise M.D.   On: 09/08/2019 23:57    EKG: Independently reviewed.  Sinus rhythm.  Assessment/Plan Principal Problem:   Volume overload Active Problems:   Type II diabetes mellitus (HCC)   Asthma   CKD (chronic kidney disease)   Anemia of chronic disease   Hypokalemia   Volume overload secondary to progression of chronic kidney disease to stage V Patient has significant bilateral lower extremity edema despite taking Bumex at home.  Also has uremic symptoms.  BNP 438.  Chest x-ray showing small bilateral pleural effusions and no Jerrin interstitial edema.  Not hypoxic and no signs of respiratory distress. BUN 57, creatinine 6.8, GFR 8.  Creatinine was 2.0 and GFR 33 in October 2019. -Nephrology was consulted by ED provider.  Dr. Moshe Cipro recommended giving IV Lasix 160 mg.  Nephrology team will see the patient in the morning. -Received Lasix as above -Receiving potassium supplementation -Appreciate nephrology recommendations -Continue to monitor renal function and volume status -Renal ultrasound -Monitor urine output  Moderate hypokalemia Likely related to home diuretic use.  Potassium 2.8.  EKG without acute changes. -Cardiac monitoring -Potassium supplementation and continue to monitor -Check magnesium level and replete if  needed  Normocytic anemia, suspect related to chronic kidney disease Hemoglobin 9.2, was 11.1 in October 2019.  MCV normal.  No signs of active bleeding. -Check iron, ferritin, TIBC  Non-insulin-dependent diabetes mellitus Blood glucose 237. -Check A1c.  Sliding scale insulin sensitive with bedtime coverage and CBG checks.  Asthma -Stable.  No bronchospasm.  Albuterol as needed.  DVT prophylaxis: Subcutaneous heparin Code Status: Full code.  Discussed with the patient. Family Communication: No family available. Disposition Plan: Anticipate discharge after clinical improvement. Consults called: None Admission status: It is my clinical opinion that admission to [facility]  is reasonable and necessary in this 63 y.o. male presenting with significant bilateral lower extremity edema secondary to progression of chronic kidney disease to stage V.  Patient will be seen by nephrology in the morning, will likely be started on hemodialysis during this hospitalization.  Given the aforementioned, the predictability of an adverse outcome is felt to be significant. I expect that the patient will require at least 2 midnights in the hospital to treat this condition.   The medical decision making on this patient was of high complexity and the patient is at high risk for clinical deterioration, therefore this is a level 3 visit.  Shela Leff MD [facility] Pager [phone]  If 7PM-7AM, please contact night-coverage [email or web address] Password [email or web address]  09/09/2019, 1:17 AM

## 2019-09-09 ENCOUNTER — Encounter (HOSPITAL_COMMUNITY): Payer: Self-pay | Admitting: General Practice

## 2019-09-09 DIAGNOSIS — E876 Hypokalemia: Secondary | ICD-10-CM

## 2019-09-09 LAB — PROTEIN / CREATININE RATIO, URINE
Creatinine, Urine: 56.53 mg/dL
Protein Creatinine Ratio: 10.6 mg/mg{Cre} — ABNORMAL HIGH (ref 0.00–0.15)
Total Protein, Urine: 599 mg/dL

## 2019-09-09 LAB — URINALYSIS, ROUTINE W REFLEX MICROSCOPIC
Bilirubin Urine: NEGATIVE
Glucose, UA: 50 mg/dL — AB
Ketones, ur: NEGATIVE mg/dL
Leukocytes,Ua: NEGATIVE
Nitrite: NEGATIVE
Protein, ur: >=300 mg/dL — AB
Specific Gravity, Urine: 1.009 (ref 1.005–1.030)
pH: 5 (ref 5.0–8.0)

## 2019-09-09 LAB — PROTIME-INR
INR: 1.1 (ref 0.8–1.2)
Prothrombin Time: 14.1 seconds (ref 11.4–15.2)

## 2019-09-09 LAB — RENAL FUNCTION PANEL
Albumin: 2.3 g/dL — ABNORMAL LOW (ref 3.5–5.0)
Anion gap: 15 (ref 5–15)
BUN: 58 mg/dL — ABNORMAL HIGH (ref 8–23)
CO2: 23 mmol/L (ref 22–32)
Calcium: 7.6 mg/dL — ABNORMAL LOW (ref 8.9–10.3)
Chloride: 100 mmol/L (ref 98–111)
Creatinine, Ser: 6.86 mg/dL — ABNORMAL HIGH (ref 0.61–1.24)
GFR calc Af Amer: 9 mL/min — ABNORMAL LOW (ref >60)
GFR calc non Af Amer: 8 mL/min — ABNORMAL LOW (ref >60)
Glucose, Bld: 169 mg/dL — ABNORMAL HIGH (ref 70–99)
Phosphorus: 6.8 mg/dL — ABNORMAL HIGH (ref 2.5–4.6)
Potassium: 2.9 mmol/L — ABNORMAL LOW (ref 3.5–5.1)
Sodium: 138 mmol/L (ref 135–145)

## 2019-09-09 LAB — GLUCOSE, CAPILLARY
Glucose-Capillary: 142 mg/dL — ABNORMAL HIGH (ref 70–99)
Glucose-Capillary: 159 mg/dL — ABNORMAL HIGH (ref 70–99)
Glucose-Capillary: 151 mg/dL — ABNORMAL HIGH (ref 70–99)

## 2019-09-09 LAB — BASIC METABOLIC PANEL
Anion gap: 14 (ref 5–15)
BUN: 62 mg/dL — ABNORMAL HIGH (ref 8–23)
CO2: 23 mmol/L (ref 22–32)
Calcium: 7.3 mg/dL — ABNORMAL LOW (ref 8.9–10.3)
Chloride: 101 mmol/L (ref 98–111)
Creatinine, Ser: 6.97 mg/dL — ABNORMAL HIGH (ref 0.61–1.24)
GFR calc Af Amer: 9 mL/min — ABNORMAL LOW (ref >60)
GFR calc non Af Amer: 8 mL/min — ABNORMAL LOW (ref >60)
Glucose, Bld: 182 mg/dL — ABNORMAL HIGH (ref 70–99)
Potassium: 3.6 mmol/L (ref 3.5–5.1)
Sodium: 138 mmol/L (ref 135–145)

## 2019-09-09 LAB — MAGNESIUM: Magnesium: 1.7 mg/dL (ref 1.7–2.4)

## 2019-09-09 LAB — APTT: aPTT: 30 seconds (ref 24–36)

## 2019-09-09 LAB — IRON AND TIBC
Iron: 28 ug/dL — ABNORMAL LOW (ref 45–182)
Saturation Ratios: 14 % — ABNORMAL LOW (ref 17.9–39.5)
TIBC: 195 ug/dL — ABNORMAL LOW (ref 250–450)
UIBC: 167 ug/dL

## 2019-09-09 LAB — TYPE AND SCREEN
ABO/RH(D): O NEG
Antibody Screen: NEGATIVE

## 2019-09-09 LAB — CBG MONITORING, ED
Glucose-Capillary: 227 mg/dL — ABNORMAL HIGH (ref 70–99)
Glucose-Capillary: 237 mg/dL — ABNORMAL HIGH (ref 70–99)

## 2019-09-09 LAB — ABO/RH: ABO/RH(D): O NEG

## 2019-09-09 LAB — HIV ANTIBODY (ROUTINE TESTING W REFLEX): HIV Screen 4th Generation wRfx: NONREACTIVE

## 2019-09-09 LAB — FERRITIN: Ferritin: 170 ng/mL (ref 24–336)

## 2019-09-09 MED ORDER — POTASSIUM CHLORIDE CRYS ER 20 MEQ PO TBCR
40.0000 meq | EXTENDED_RELEASE_TABLET | Freq: Once | ORAL | Status: AC
  Administered 2019-09-09: 40 meq via ORAL
  Filled 2019-09-09: qty 2

## 2019-09-09 MED ORDER — BUMETANIDE 0.25 MG/ML IJ SOLN
2.0000 mg | Freq: Two times a day (BID) | INTRAMUSCULAR | Status: DC
  Administered 2019-09-09 – 2019-09-11 (×4): 2 mg via INTRAVENOUS
  Filled 2019-09-09 (×5): qty 8

## 2019-09-09 MED ORDER — LOPERAMIDE HCL 2 MG PO CAPS
4.0000 mg | ORAL_CAPSULE | Freq: Once | ORAL | Status: DC
  Filled 2019-09-09 (×2): qty 2

## 2019-09-09 MED ORDER — ALBUTEROL SULFATE (2.5 MG/3ML) 0.083% IN NEBU: 2.5000 mg | INHALATION_SOLUTION | Freq: Four times a day (QID) | RESPIRATORY_TRACT | Status: DC | PRN

## 2019-09-09 MED ORDER — HEPARIN SODIUM (PORCINE) 5000 UNIT/ML IJ SOLN
5000.0000 [IU] | Freq: Three times a day (TID) | INTRAMUSCULAR | Status: DC
  Administered 2019-09-10 – 2019-09-13 (×10): 5000 [IU] via SUBCUTANEOUS
  Filled 2019-09-09 (×8): qty 1

## 2019-09-09 NOTE — Progress Notes (Signed)
Chief Complaint: Patient was seen in consultation today for renal biopsy at the request of Dr. Pearson Grippe  Referring Physician(s): *Dr. Pearson Grippe  Supervising Physician: Corrie Mckusick  Patient Status: [facility] - In-pt  History of Present Illness: Duane Hall is a 63 y.o. male with progressive renal failure and proteinuria. He was originally scheduled for random renal biopsy next week but has now been admitted with volume overload. IR is asked to perform renal biopsy while he is inpt to expedite workup. PMHx, meds, labs, imaging, allergies reviewed. Feels well otherwise, no recent fevers, chills, illness.    Past Medical History:  Diagnosis Date   Asthma    Cancer ([place])    Cataract    CKD (chronic kidney disease), stage III ([place])    Diabetes mellitus    Glaucoma     Past Surgical History:  Procedure Laterality Date   COLONOSCOPY     UPPER GASTROINTESTINAL ENDOSCOPY  02/2019   Dr Benson Norway      Allergies: Patient has no known allergies.  Medications:  Current Facility-Administered Medications:    acetaminophen (TYLENOL) tablet 650 mg, 650 mg, Oral, Q6H PRN **OR** acetaminophen (TYLENOL) suppository 650 mg, 650 mg, Rectal, Q6H PRN, Shela Leff, MD   albuterol (PROVENTIL) (2.5 MG/3ML) 0.083% nebulizer solution 2.5 mg, 2.5 mg, Nebulization, Q6H PRN, Shela Leff, MD   bumetanide (BUMEX) injection 2 mg, 2 mg, Intravenous, BID, Pearson Grippe B, MD   [START ON 09/10/2019] heparin injection 5,000 Units, 5,000 Units, Subcutaneous, Q8H, Allred, Darrell K, PA-C   insulin aspart (novoLOG) injection 0-5 Units, 0-5 Units, Subcutaneous, QHS, Shela Leff, MD, 2 Units at 09/09/19 0054   insulin aspart (novoLOG) injection 0-9 Units, 0-9 Units, Subcutaneous, TID WC, Shela Leff, MD, 3 Units at 09/09/19 0859   loperamide (IMODIUM) capsule 4 mg, 4 mg, Oral, Once, Rai, Ripudeep K, MD    Family History  Problem Relation Age of Onset    Cancer Father     Social History   Socioeconomic History   Marital status: Married    Spouse name: Not on file   Number of children: Not on file   Years of education: Not on file   Highest education level: Not on file  Occupational History   Occupation: Advertising copywriter strain: Not on file   Food insecurity    Worry: Not on file    Inability: Not on file   Transportation needs    Medical: Not on file    Non-medical: Not on file  Tobacco Use   Smoking status: Current Every Day Smoker    Packs/day: 0.80    Types: Cigarettes   Smokeless tobacco: Never Used  Substance and Sexual Activity   Alcohol use: No    Alcohol/week: 0.0 standard drinks   Drug use: No   Sexual activity: Not on file  Lifestyle   Physical activity    Days per week: Not on file    Minutes per session: Not on file   Stress: Not on file  Relationships   Social connections    Talks on phone: Not on file    Gets together: Not on file    Attends religious service: Not on file    Active member of club or organization: Not on file    Attends meetings of clubs or organizations: Not on file    Relationship status: Not on file  Other Topics Concern   Not on file  Social History Narrative  Married.      Review of Systems: A 12 point ROS discussed and pertinent positives are indicated in the HPI above.  All other systems are negative.  Review of Systems  Vital Signs: BP (!) 164/91 (BP Location: Left Arm)    Pulse 94    Temp 98.4 F (36.9 C) (Oral)    Resp 18    SpO2 100%   Physical Exam Constitutional:      Appearance: Normal appearance.  HENT:     Mouth/Throat:     Mouth: Mucous membranes are moist.     Pharynx: Oropharynx is clear.  Neck:     Musculoskeletal: Normal range of motion.  Cardiovascular:     Rate and Rhythm: Normal rate and regular rhythm.     Heart sounds: Normal heart sounds.  Pulmonary:     Effort: Pulmonary effort is normal. No  respiratory distress.     Breath sounds: Normal breath sounds.  Abdominal:     General: Abdomen is flat.     Palpations: Abdomen is soft.  Skin:    General: Skin is warm and dry.  Neurological:     General: No focal deficit present.     Mental Status: He is alert and oriented to person, place, and time.  Psychiatric:        Mood and Affect: Mood normal.        Judgment: Judgment normal.       Imaging: Dg Chest 2 View  Result Date: 09/08/2019 CLINICAL DATA:  Bilateral lower extremity swelling x2 months EXAM: CHEST - 2 VIEW COMPARISON:  CT chest dated 02/24/2019 FINDINGS: Small bilateral pleural effusions.  No Koven interstitial edema. Mild bibasilar opacities, likely atelectasis. No pneumothorax. The heart is normal in size.  Thoracic aortic atherosclerosis. Visualized osseous structures are within normal limits. IMPRESSION: Small bilateral pleural effusions.  No Burnette interstitial edema. Electronically Signed   By: Julian Hy M.D.   On: 09/08/2019 18:06   US Renal  Result Date: 09/08/2019 CLINICAL DATA:  Chronic kidney disease EXAM: RENAL / URINARY TRACT ULTRASOUND COMPLETE COMPARISON:  10/08/2013 FINDINGS: Right Kidney: Renal measurements: 10.1 x 5.7 x 5.1 cm = volume: 155 mL. Mildly increased echotexture diffusely. No mass or hydronephrosis. Left Kidney: Renal measurements: 10.3 x 4.8 x 4.9 cm = volume: 127 mL. Diffusely increased echotexture. No hydronephrosis. 2.7 cm cyst in the upper pole. No hydronephrosis. Bladder: Diffusely thickened irregular bladder wall. IMPRESSION: Increased echotexture in the kidneys bilaterally compatible with chronic medical renal disease. No hydronephrosis. Thickened, irregular bladder wall. While this could be related to cystitis, infiltrating malignancy cannot be excluded. Recommend correlation urinalysis and possible cystoscopy if felt clinically indicated. Electronically Signed   By: Rolm Baptise M.D.   On: 09/08/2019 23:57     Labs:  CBC: Recent Labs    10/22/18 1524 09/08/19 1710 09/08/19 2140  WBC 10.0 9.5  --   HGB 11.1* 9.2* 8.8*  HCT 31.5* 26.3* 26.0*  PLT 207 243  --     COAGS: No results for input(s): INR, APTT in the last 8760 hours.  BMP: Recent Labs    10/22/18 1524 09/08/19 1710 09/08/19 2130 09/08/19 2140 09/09/19 1049  NA 129* 137 138 138 138  K 4.1 2.8* 2.9* 3.2* 3.6  CL 92* 101 100 100 101  CO2 29 24 23   --  23  GLUCOSE 475* 192* 169* 197* 182*  BUN 30* 57* 58* 52* 62*  CALCIUM 9.7 7.5* 7.6*  --  7.3*  CREATININE  2.09* 6.86* 6.86* 7.10* 6.97*  GFRNONAA 33* 8* 8*  --  8*  GFRAA 38* 9* 9*  --  9*    LIVER FUNCTION TESTS: Recent Labs    10/22/18 1524 09/08/19 1710 09/08/19 2130  BILITOT 0.8 0.2*  --   AST 11* 12*  --   ALT 9 11  --   ALKPHOS 75 106  --   PROT 6.0* 4.9*  --   ALBUMIN 3.3* 2.4* 2.3*    TUMOR MARKERS: No results for input(s): AFPTM, CEA, CA199, CHROMGRNA in the last 8760 hours.  Assessment and Plan: CKD and proteinuria For US guided random renal biopsy. Plan for tomorrow. Labs ok Risks and benefits of renal biopsy was discussed with the patient and/or patient's family including, but not limited to bleeding, infection, damage to adjacent structures or low yield requiring additional tests.  All of the questions were answered and there is agreement to proceed.  Consent signed and in chart.    Thank you for this interesting consult.  I greatly enjoyed meeting FUTURE YELDELL and look forward to participating in their care.  A copy of this report was sent to the requesting provider on this date.  Electronically Signed: Ascencion Dike, PA-C 09/09/2019, 1:28 PM   I spent a total of 20 minutes in face to face in clinical consultation, greater than 50% of which was counseling/coordinating care for renal biopsy

## 2019-09-09 NOTE — Progress Notes (Signed)
[facility]                                                                              Patient Demographics  Duane Hall, is a 63 y.o. male, DOB - March 14, 1956, JJK:[id number]  Admit date - 09/08/2019   Admitting Physician Duane Leff, MD  Outpatient Primary MD for the patient is Duane Bene, PA-C  Outpatient specialists:   LOS - 1  days   Medical records reviewed and are as summarized below:    Chief Complaint  Patient presents with  . Leg Swelling       Brief summary   Patient is a 63 year old male with CKD stage III, asthma, diabetes type 2 (follows Duane Hall), presented to ED with bilateral lower extremity edema.  Patient was seen earlier on the day of admission by Duane Hall and was sent to ED for evaluation of worsening renal function.  Patient reported 76-month history of progressively worsening bilateral lower extremity edema.  Per patient he was scheduled for renal biopsy next week however on the day of admission, his nephrologist was concerned about his renal function.  Patient also reports that he was started on diuretic, however states that it gives him diarrhea. BUN 57, creatinine 6.86 on admission, baseline 2.09 in 09/2018  Assessment & Plan    Principal Problem:   Volume overload secondary to likely progression of CKD stage III -Patient has a history of baseline CKD, stage III, presenting with significant bilateral lower extremity edema, was started on diuretic outpatient however states that it gives him diarrhea so unclear if he was actually compliant with with the diuretic -Chest x-ray showed small bilateral pleural effusions, BNP 438 -Creatinine trending up to 7.1.  Nephrology consulted, patient was given IV Lasix in ED, Duane Hall to see today for further recommendation.  Recheck bmet -Hold losartan,  PPI -Renal ultrasound showed chronic medical renal disease, no obstruction or hydronephrosis.  Thickened irregular  bladder wall could be cystitis, infiltrating malignancy cannot be excluded, cystoscopy if clinically indicated. -UA negative for nitrites, leukocytes   Active Problems:   Type II diabetes mellitus (HCC) NIDDM with renal complications  -Follow hemoglobin A1c, placed on sliding scale insulin, hold oral hypoglycemics  Normocytic anemia likely related to CKD stage III -Hb 8.2, was 11.1 in 09/2018, ESA per nephrology    Asthma -Currently stable, no wheezing  Hypokalemia -Likely due to diuretic use at home, diarrhea -Presented with potassium of 2.8, was replaced, will recheck bmet today  Diarrhea -Patient reports that he gets diarrhea after taking the diuretic at home.  No fevers or chills, no leukocytosis, no abdominal tenderness. -Reports 2 BMs this morning, will give 1 dose of loperamide   Code Status: full code DVT Prophylaxis: Heparin subcu Family Communication: Discussed all imaging results, lab results, explained to the patient   Disposition Plan: Awaiting nephrology recommendations, may need to initiate dialysis  Time Spent in minutes 35 minutes  Procedures:  Renal ultrasound  Consultants:   Nephrology  Antimicrobials:   Anti-infectives (From admission, onward)   None          Medications  Scheduled Meds: .  heparin  5,000 Units Subcutaneous Q8H  . insulin aspart  0-5 Units Subcutaneous QHS  . insulin aspart  0-9 Units Subcutaneous TID WC  . pantoprazole  40 mg Oral Daily   Continuous Infusions: PRN Meds:.acetaminophen **OR** acetaminophen, albuterol      Subjective:   Duane Hall was seen and examined today.  Somewhat frustrated, states diuretics gives him diarrhea and had 2 watery bowel movements this morning.  No fevers or chills, no nausea or vomiting.  Patient denies dizziness, chest pain, shortness of breath, abdominal pain. No acute events overnight.    Objective:   Vitals:   09/09/19 0845 09/09/19 0900 09/09/19 0915 09/09/19 0930  BP:  136/86 (!) 145/84 (!) 151/79 (!) 142/72  Pulse:      Resp: 18 18 19 19   Temp:      TempSrc:      SpO2:        Intake/Output Summary (Last 24 hours) at 09/09/2019 1016 Last data filed at 09/09/2019 3500 Gross per 24 hour  Intake 340 ml  Output 325 ml  Net 15 ml     Wt Readings from Last 3 Encounters:  03/24/19 55 kg  10/22/18 54 kg  11/08/15 83.4 kg     Exam  General: Alert and oriented x 3, NAD  Eyes:   HEENT:  Atraumatic, normocephalic  Cardiovascular: S1 S2 auscultated, no murmurs, RRR  Respiratory: Clear to auscultation bilaterally  Gastrointestinal: Soft, nontender, nondistended, + bowel sounds  Ext: 3+ pitting edema of the bilateral lower extremities   Neuro no new deficits  Musculoskeletal: No digital cyanosis, clubbing  Skin: No rashes  Psych: Normal affect and demeanor, alert and oriented x3    Data Reviewed:  I have personally reviewed following labs and imaging studies  Micro Results Recent Results (from the past 240 hour(s))  SARS Coronavirus 2 [facility] order, Performed in [place] hospital lab) Nasopharyngeal Nasopharyngeal Swab     Status: None   Collection Time: 09/08/19  9:33 PM   Specimen: Nasopharyngeal Swab  Result Value Ref Range Status   SARS Coronavirus 2 NEGATIVE NEGATIVE Final    Comment: (NOTE) If result is NEGATIVE SARS-CoV-2 target nucleic acids are NOT DETECTED. The SARS-CoV-2 RNA is generally detectable in upper and lower  respiratory specimens during the acute phase of infection. The lowest  concentration of SARS-CoV-2 viral copies this assay can detect is 250  copies / mL. A negative result does not preclude SARS-CoV-2 infection  and should not be used as the sole basis for treatment or other  patient management decisions.  A negative result may occur with  improper specimen collection / handling, submission of specimen other  than nasopharyngeal swab, presence of viral mutation(s) within the  areas targeted by this  assay, and inadequate number of viral copies  (<250 copies / mL). A negative result must be combined with clinical  observations, patient history, and epidemiological information. If result is POSITIVE SARS-CoV-2 target nucleic acids are DETECTED. The SARS-CoV-2 RNA is generally detectable in upper and lower  respiratory specimens dur ing the acute phase of infection.  Positive  results are indicative of active infection with SARS-CoV-2.  Clinical  correlation with patient history and other diagnostic information is  necessary to determine patient infection status.  Positive results do  not rule out bacterial infection or co-infection with other viruses. If result is PRESUMPTIVE POSTIVE SARS-CoV-2 nucleic acids MAY BE PRESENT.   A presumptive positive result was obtained on the submitted specimen  and  confirmed on repeat testing.  While 2019 novel coronavirus  (SARS-CoV-2) nucleic acids may be present in the submitted sample  additional confirmatory testing may be necessary for epidemiological  and / or clinical management purposes  to differentiate between  SARS-CoV-2 and other Sarbecovirus currently known to infect humans.  If clinically indicated additional testing with an alternate test  methodology [phone]) is advised. The SARS-CoV-2 RNA is generally  detectable in upper and lower respiratory sp ecimens during the acute  phase of infection. The expected result is Negative. Fact Sheet for Patients:  StrictlyIdeas.no Fact Sheet for Healthcare Providers: BankingDealers.co.za This test is not yet approved or cleared by the [place] FDA and has been authorized for detection and/or diagnosis of SARS-CoV-2 by FDA under an Emergency Use Authorization (EUA).  This EUA will remain in effect (meaning this test can be used) for the duration of the COVID-19 declaration under Section 564(b)(1) of the Act, 21 U.S.C. section 360bbb-3(b)(1),  unless the authorization is terminated or revoked sooner. Performed at [facility], [place] [address] [place], [place] [address]     Radiology Reports Dg Chest 2 View  Result Date: 09/08/2019 CLINICAL DATA:  Bilateral lower extremity swelling x2 months EXAM: CHEST - 2 VIEW COMPARISON:  CT chest dated 02/24/2019 FINDINGS: Small bilateral pleural effusions.  No Abron interstitial edema. Mild bibasilar opacities, likely atelectasis. No pneumothorax. The heart is normal in size.  Thoracic aortic atherosclerosis. Visualized osseous structures are within normal limits. IMPRESSION: Small bilateral pleural effusions.  No Dory interstitial edema. Electronically Signed   By: Julian Hy M.D.   On: 09/08/2019 18:06   US Renal  Result Date: 09/08/2019 CLINICAL DATA:  Chronic kidney disease EXAM: RENAL / URINARY TRACT ULTRASOUND COMPLETE COMPARISON:  10/08/2013 FINDINGS: Right Kidney: Renal measurements: 10.1 x 5.7 x 5.1 cm = volume: 155 mL. Mildly increased echotexture diffusely. No mass or hydronephrosis. Left Kidney: Renal measurements: 10.3 x 4.8 x 4.9 cm = volume: 127 mL. Diffusely increased echotexture. No hydronephrosis. 2.7 cm cyst in the upper pole. No hydronephrosis. Bladder: Diffusely thickened irregular bladder wall. IMPRESSION: Increased echotexture in the kidneys bilaterally compatible with chronic medical renal disease. No hydronephrosis. Thickened, irregular bladder wall. While this could be related to cystitis, infiltrating malignancy cannot be excluded. Recommend correlation urinalysis and possible cystoscopy if felt clinically indicated. Electronically Signed   By: Rolm Baptise M.D.   On: 09/08/2019 23:57    Lab Data:  CBC: Recent Labs  Lab 09/08/19 1710 09/08/19 2140  WBC 9.5  --   NEUTROABS 5.8  --   HGB 9.2* 8.8*  HCT 26.3* 26.0*  MCV 87.1  --   PLT 243  --    Basic Metabolic Panel: Recent Labs  Lab 09/08/19 1710 09/08/19 2130 09/08/19 2140  NA 137 138 138  K  2.8* 2.9* 3.2*  CL 101 100 100  CO2 24 23  --   GLUCOSE 192* 169* 197*  BUN 57* 58* 52*  CREATININE 6.86* 6.86* 7.10*  CALCIUM 7.5* 7.6*  --   MG  --  1.7  --   PHOS  --  6.8*  --    GFR: CrCl cannot be calculated (Unknown ideal weight.). Liver Function Tests: Recent Labs  Lab 09/08/19 1710 09/08/19 2130  AST 12*  --   ALT 11  --   ALKPHOS 106  --   BILITOT 0.2*  --   PROT 4.9*  --   ALBUMIN 2.4* 2.3*   No results for input(s):  LIPASE, AMYLASE in the last 168 hours. No results for input(s): AMMONIA in the last 168 hours. Coagulation Profile: No results for input(s): INR, PROTIME in the last 168 hours. Cardiac Enzymes: No results for input(s): CKTOTAL, CKMB, CKMBINDEX, TROPONINI in the last 168 hours. BNP (last 3 results) No results for input(s): PROBNP in the last 8760 hours. HbA1C: No results for input(s): HGBA1C in the last 72 hours. CBG: Recent Labs  Lab 09/09/19 0041 09/09/19 0854  GLUCAP 237* 227*   Lipid Profile: No results for input(s): CHOL, HDL, LDLCALC, TRIG, CHOLHDL, LDLDIRECT in the last 72 hours. Thyroid Function Tests: No results for input(s): TSH, T4TOTAL, FREET4, T3FREE, THYROIDAB in the last 72 hours. Anemia Panel: Recent Labs    09/08/19 2130  FERRITIN 170  TIBC 195*  IRON 28*   Urine analysis:    Component Value Date/Time   COLORURINE YELLOW 09/09/2019 0343   APPEARANCEUR CLEAR 09/09/2019 0343   LABSPEC 1.009 09/09/2019 0343   PHURINE 5.0 09/09/2019 0343   GLUCOSEU 50 (A) 09/09/2019 0343   HGBUR MODERATE (A) 09/09/2019 0343   BILIRUBINUR NEGATIVE 09/09/2019 0343   KETONESUR NEGATIVE 09/09/2019 0343   PROTEINUR >=300 (A) 09/09/2019 0343   NITRITE NEGATIVE 09/09/2019 0343   LEUKOCYTESUR NEGATIVE 09/09/2019 0343     Shamica Moree M.D. [facility] 09/09/2019, 10:16 AM  Pager: [phone] Between 7am to 7pm - call Pager - [phone]  After 7pm go to [email or web address] - password [email or web address]  Call night coverage person covering after  7pm

## 2019-09-09 NOTE — ED Notes (Signed)
Pt complains medicine gave him diarrhea. RN notified

## 2019-09-09 NOTE — ED Notes (Signed)
ED TO INPATIENT HANDOFF REPORT  ED Nurse Name and Phone #:    S Name/Age/Gender [name] 63 y.o. male Room/Bed: 014C/014C  Code Status   Code Status: Full Code  Home/SNF/Other Home Patient oriented to: self, place, time and situation Is this baseline? Yes   Triage Complete: Triage complete  Chief Complaint edema  Triage Note Pt states that he has had increased ankle swelling over the past month and was sent to ED by nephrology for admit. Significant pitting edema noted to both ankles. Pt denies SOB at this time. States he has pain with ambulation due to foot pressure.    Allergies No Known Allergies  Level of Care/Admitting Diagnosis ED Disposition    ED Disposition Condition [facility] Area: [place] [[id number]]  Level of Care: Med-Surg [16]  Covid Evaluation: Asymptomatic Screening Protocol (No Symptoms)  Diagnosis: Volume overload [716967]  Admitting Physician: Shela Leff [[id number]]  Attending Physician: Shela Leff [[id number]]  Estimated length of stay: past midnight tomorrow  Certification:: I certify this patient will need inpatient services for at least 2 midnights  PT Class (Do Not Modify): Inpatient [101]  PT Acc Code (Do Not Modify): Private [1]       B Medical/Surgery History Past Medical History:  Diagnosis Date  . Asthma   . Cancer ([place])   . Cataract   . Diabetes mellitus   . Glaucoma    No past surgical history on file.   A IV Location/Drains/Wounds Patient Lines/Drains/Airways Status   Active Line/Drains/Airways    Name:   Placement date:   Placement time:   Site:   Days:   Peripheral IV 09/08/19 Right Antecubital   09/08/19    2200    Antecubital   1          Intake/Output Last 24 hours  Intake/Output Summary (Last 24 hours) at 09/09/2019 0925 Last data filed at 09/09/2019 0646 Gross per 24 hour  Intake 340 ml  Output 325 ml  Net 15 ml    Labs/Imaging Results for orders  placed or performed during the hospital encounter of 09/08/19 (from the past 48 hour(s))  Comprehensive metabolic panel     Status: Abnormal   Collection Time: 09/08/19  5:10 PM  Result Value Ref Range   Sodium 137 135 - 145 mmol/L   Potassium 2.8 (L) 3.5 - 5.1 mmol/L   Chloride 101 98 - 111 mmol/L   CO2 24 22 - 32 mmol/L   Glucose, Bld 192 (H) 70 - 99 mg/dL   BUN 57 (H) 8 - 23 mg/dL   Creatinine, Ser 6.86 (H) 0.61 - 1.24 mg/dL   Calcium 7.5 (L) 8.9 - 10.3 mg/dL   Total Protein 4.9 (L) 6.5 - 8.1 g/dL   Albumin 2.4 (L) 3.5 - 5.0 g/dL   AST 12 (L) 15 - 41 U/L   ALT 11 0 - 44 U/L   Alkaline Phosphatase 106 38 - 126 U/L   Total Bilirubin 0.2 (L) 0.3 - 1.2 mg/dL   GFR calc non Af Amer 8 (L) >60 mL/min   GFR calc Af Amer 9 (L) >60 mL/min   Anion gap 12 5 - 15    Comment: Performed at [facility], [address] [place], [place] [address]  CBC with Differential     Status: Abnormal   Collection Time: 09/08/19  5:10 PM  Result Value Ref Range   WBC 9.5 4.0 - 10.5 K/uL  RBC 3.02 (L) 4.22 - 5.81 MIL/uL   Hemoglobin 9.2 (L) 13.0 - 17.0 g/dL   HCT 26.3 (L) 39.0 - 52.0 %   MCV 87.1 80.0 - 100.0 fL   MCH 30.5 26.0 - 34.0 pg   MCHC 35.0 30.0 - 36.0 g/dL   RDW 13.6 11.5 - 15.5 %   Platelets 243 150 - 400 K/uL   nRBC 0.0 0.0 - 0.2 %   Neutrophils Relative % 61 %   Neutro Abs 5.8 1.7 - 7.7 K/uL   Lymphocytes Relative 15 %   Lymphs Abs 1.4 0.7 - 4.0 K/uL   Monocytes Relative 6 %   Monocytes Absolute 0.6 0.1 - 1.0 K/uL   Eosinophils Relative 17 %   Eosinophils Absolute 1.6 (H) 0.0 - 0.5 K/uL   Basophils Relative 1 %   Basophils Absolute 0.1 0.0 - 0.1 K/uL   Immature Granulocytes 0 %   Abs Immature Granulocytes 0.03 0.00 - 0.07 K/uL    Comment: Performed at [facility] [address], [place], [place] [address]  Brain natriuretic peptide     Status: Abnormal   Collection Time: 09/08/19  5:10 PM  Result Value Ref Range   B Natriuretic Peptide 438.8 (H) 0.0 - 100.0 pg/mL     Comment: Performed at [facility] [address] [place], [place] [address]  Iron and TIBC     Status: Abnormal   Collection Time: 09/08/19  9:30 PM  Result Value Ref Range   Iron 28 (L) 45 - 182 ug/dL   TIBC 195 (L) 250 - 450 ug/dL   Saturation Ratios 14 (L) 17.9 - 39.5 %   UIBC 167 ug/dL    Comment: Performed at [facility] [address], [place], [place] [address]  Ferritin     Status: None   Collection Time: 09/08/19  9:30 PM  Result Value Ref Range   Ferritin 170 24 - 336 ng/mL    Comment: Performed at [facility] [address] [place], [place] [address]  Renal function panel     Status: Abnormal   Collection Time: 09/08/19  9:30 PM  Result Value Ref Range   Sodium 138 135 - 145 mmol/L   Potassium 2.9 (L) 3.5 - 5.1 mmol/L   Chloride 100 98 - 111 mmol/L   CO2 23 22 - 32 mmol/L   Glucose, Bld 169 (H) 70 - 99 mg/dL   BUN 58 (H) 8 - 23 mg/dL   Creatinine, Ser 6.86 (H) 0.61 - 1.24 mg/dL   Calcium 7.6 (L) 8.9 - 10.3 mg/dL   Phosphorus 6.8 (H) 2.5 - 4.6 mg/dL   Albumin 2.3 (L) 3.5 - 5.0 g/dL   GFR calc non Af Amer 8 (L) >60 mL/min   GFR calc Af Amer 9 (L) >60 mL/min   Anion gap 15 5 - 15    Comment: Performed at [facility], [address] [place], [place] [address]  Magnesium     Status: None   Collection Time: 09/08/19  9:30 PM  Result Value Ref Range   Magnesium 1.7 1.7 - 2.4 mg/dL    Comment: Performed at [facility], [place] [address], [place], [place] [address]  SARS Coronavirus 2 [facility] order, Performed in [facility] hospital lab) Nasopharyngeal Nasopharyngeal Swab     Status: None   Collection Time: 09/08/19  9:33 PM   Specimen: Nasopharyngeal Swab  Result Value Ref Range   SARS  Coronavirus 2 NEGATIVE NEGATIVE    Comment: (NOTE) If result is NEGATIVE SARS-CoV-2 target nucleic acids are NOT DETECTED. The SARS-CoV-2 RNA is generally detectable in upper and lower  respiratory specimens during the acute phase of infection.  The lowest  concentration of SARS-CoV-2 viral copies this assay can detect is 250  copies / mL. A negative result does not preclude SARS-CoV-2 infection  and should not be used as the sole basis for treatment or other  patient management decisions.  A negative result may occur with  improper specimen collection / handling, submission of specimen other  than nasopharyngeal swab, presence of viral mutation(s) within the  areas targeted by this assay, and inadequate number of viral copies  (<250 copies / mL). A negative result must be combined with clinical  observations, patient history, and epidemiological information. If result is POSITIVE SARS-CoV-2 target nucleic acids are DETECTED. The SARS-CoV-2 RNA is generally detectable in upper and lower  respiratory specimens dur ing the acute phase of infection.  Positive  results are indicative of active infection with SARS-CoV-2.  Clinical  correlation with patient history and other diagnostic information is  necessary to determine patient infection status.  Positive results do  not rule out bacterial infection or co-infection with other viruses. If result is PRESUMPTIVE POSTIVE SARS-CoV-2 nucleic acids MAY BE PRESENT.   A presumptive positive result was obtained on the submitted specimen  and confirmed on repeat testing.  While 2019 novel coronavirus  (SARS-CoV-2) nucleic acids may be present in the submitted sample  additional confirmatory testing may be necessary for epidemiological  and / or clinical management purposes  to differentiate between  SARS-CoV-2 and other Sarbecovirus currently known to infect humans.  If clinically indicated additional testing with an alternate test  methodology [phone]) is advised. The SARS-CoV-2 RNA is generally  detectable in upper and lower respiratory sp ecimens during the acute  phase of infection. The expected result is Negative. Fact Sheet for Patients:   StrictlyIdeas.no Fact Sheet for Healthcare Providers: BankingDealers.co.za This test is not yet approved or cleared by the [place] FDA and has been authorized for detection and/or diagnosis of SARS-CoV-2 by FDA under an Emergency Use Authorization (EUA).  This EUA will remain in effect (meaning this test can be used) for the duration of the COVID-19 declaration under Section 564(b)(1) of the Act, 21 U.S.C. section 360bbb-3(b)(1), unless the authorization is terminated or revoked sooner. Performed at [facility], [place] [address] [place], [place] [address]   I-stat chem 8, ED (not at [facility] or [facility])     Status: Abnormal   Collection Time: 09/08/19  9:40 PM  Result Value Ref Range   Sodium 138 135 - 145 mmol/L   Potassium 3.2 (L) 3.5 - 5.1 mmol/L   Chloride 100 98 - 111 mmol/L   BUN 52 (H) 8 - 23 mg/dL   Creatinine, Ser 7.10 (H) 0.61 - 1.24 mg/dL   Glucose, Bld 197 (H) 70 - 99 mg/dL   Calcium, Ion 0.98 (L) 1.15 - 1.40 mmol/L   TCO2 24 22 - 32 mmol/L   Hemoglobin 8.8 (L) 13.0 - 17.0 g/dL   HCT 26.0 (L) 39.0 - 52.0 %  CBG monitoring, ED     Status: Abnormal   Collection Time: 09/09/19 12:41 AM  Result Value Ref Range   Glucose-Capillary 237 (H) 70 - 99 mg/dL   Comment 1 Notify RN    Comment 2 Document in Chart   Urinalysis, Routine w reflex  microscopic     Status: Abnormal   Collection Time: 09/09/19  3:43 AM  Result Value Ref Range   Color, Urine YELLOW YELLOW   APPearance CLEAR CLEAR   Specific Gravity, Urine 1.009 1.005 - 1.030   pH 5.0 5.0 - 8.0   Glucose, UA 50 (A) NEGATIVE mg/dL   Hgb urine dipstick MODERATE (A) NEGATIVE   Bilirubin Urine NEGATIVE NEGATIVE   Ketones, ur NEGATIVE NEGATIVE mg/dL   Protein, ur >=300 (A) NEGATIVE mg/dL   Nitrite NEGATIVE NEGATIVE   Leukocytes,Ua NEGATIVE NEGATIVE   RBC / HPF 21-50 0 - 5 RBC/hpf   WBC, UA 0-5 0 - 5 WBC/hpf   Bacteria, UA RARE (A) NONE SEEN   Squamous Epithelial / LPF  0-5 0 - 5   Mucus PRESENT    Hyaline Casts, UA PRESENT     Comment: Performed at [facility], [address] [place], [place] [address]  CBG monitoring, ED     Status: Abnormal   Collection Time: 09/09/19  8:54 AM  Result Value Ref Range   Glucose-Capillary 227 (H) 70 - 99 mg/dL   Dg Chest 2 View  Result Date: 09/08/2019 CLINICAL DATA:  Bilateral lower extremity swelling x2 months EXAM: CHEST - 2 VIEW COMPARISON:  CT chest dated 02/24/2019 FINDINGS: Small bilateral pleural effusions.  No Salvator interstitial edema. Mild bibasilar opacities, likely atelectasis. No pneumothorax. The heart is normal in size.  Thoracic aortic atherosclerosis. Visualized osseous structures are within normal limits. IMPRESSION: Small bilateral pleural effusions.  No Kaide interstitial edema. Electronically Signed   By: Julian Hy M.D.   On: 09/08/2019 18:06   US Renal  Result Date: 09/08/2019 CLINICAL DATA:  Chronic kidney disease EXAM: RENAL / URINARY TRACT ULTRASOUND COMPLETE COMPARISON:  10/08/2013 FINDINGS: Right Kidney: Renal measurements: 10.1 x 5.7 x 5.1 cm = volume: 155 mL. Mildly increased echotexture diffusely. No mass or hydronephrosis. Left Kidney: Renal measurements: 10.3 x 4.8 x 4.9 cm = volume: 127 mL. Diffusely increased echotexture. No hydronephrosis. 2.7 cm cyst in the upper pole. No hydronephrosis. Bladder: Diffusely thickened irregular bladder wall. IMPRESSION: Increased echotexture in the kidneys bilaterally compatible with chronic medical renal disease. No hydronephrosis. Thickened, irregular bladder wall. While this could be related to cystitis, infiltrating malignancy cannot be excluded. Recommend correlation urinalysis and possible cystoscopy if felt clinically indicated. Electronically Signed   By: Rolm Baptise M.D.   On: 09/08/2019 23:57    Pending Labs Unresulted Labs (From admission, onward)    Start     Ordered   09/09/19 0500  Hemoglobin A1c  Tomorrow morning,   AD     Comments: To assess prior glycemic control    09/08/19 2251   09/08/19 2249  HIV antibody (Routine Testing)  Once,   STAT     09/08/19 2251          Vitals/Pain Today's Vitals   09/09/19 0830 09/09/19 0845 09/09/19 0900 09/09/19 0915  BP: (!) 152/78 136/86 (!) 145/84 (!) 151/79  Pulse:      Resp: 20 18 18 19   Temp:      TempSrc:      SpO2:      PainSc:        Isolation Precautions No active isolations  Medications Medications  pantoprazole (PROTONIX) EC tablet 40 mg (40 mg Oral Given 09/09/19 0853)  heparin injection 5,000 Units (has no administration in time range)  acetaminophen (TYLENOL) tablet 650 mg (has no administration in time range)  Or  acetaminophen (TYLENOL) suppository 650 mg (has no administration in time range)  insulin aspart (novoLOG) injection 0-9 Units (3 Units Subcutaneous Given 09/09/19 0859)  insulin aspart (novoLOG) injection 0-5 Units (2 Units Subcutaneous Given 09/09/19 0054)  albuterol (PROVENTIL) (2.5 MG/3ML) 0.083% nebulizer solution 2.5 mg (has no administration in time range)  potassium chloride SA (K-DUR) CR tablet 40 mEq (40 mEq Oral Given 09/08/19 2122)  potassium chloride 10 mEq in 100 mL IVPB (0 mEq Intravenous Stopped 09/09/19 0000)  furosemide (LASIX) 160 mg in dextrose 5 % 50 mL IVPB (0 mg Intravenous Stopped 09/09/19 0000)  potassium chloride SA (K-DUR) CR tablet 20 mEq (20 mEq Oral Given 09/09/19 0054)  potassium chloride SA (K-DUR) CR tablet 40 mEq (40 mEq Oral Given 09/09/19 0853)    Mobility walks     Focused Assessments Cardiac Assessment Handoff:  Cardiac Rhythm: Normal sinus rhythm No results found for: CKTOTAL, CKMB, CKMBINDEX, TROPONINI No results found for: DDIMER Does the Patient currently have chest pain? No      R Recommendations: See Admitting Provider Note  Report given to:   Additional Notes: .

## 2019-09-09 NOTE — ED Notes (Signed)
MS   Breakfast ordered  

## 2019-09-09 NOTE — Consult Note (Addendum)
[place] Date: 09/08/2019 09/09/2019 Rexene Agent Requesting Physician:  Rai MD  Reason for Consult:  Renal Failure, Nephrotic Proteinuria HPI:  22M admitted overnight at direction for progressive renal failure with nephrotic proteinuria.  PMH of DM2 (established PDR and DPN -- sig gastroparesis), history of hepatitis C, PAD, hyperlipidemia.  Patient has intermittently followed in our office and up to the first part of the year the creatinine was 1.6.  At the end of August patient was seen by Dr. Johnney Ou and creatinine had worsened to 4.3, at that time he had findings of a 20 pound weight gain and edema, worrisome for proteinuria and a UPC was 12.8.  Serologies at that time included negative ANA, ANCA.  Phospholipase A2 receptor auto antibody negative.  SPEP negative.  Serum free light chains without evidence of abnormal result.  Patient had a normal C3 and C4 that was depressed at 2.  He was started on torsemide, which she did not tolerate well secondary to diarrhea and eventually stopped it.  He had follow-up labs on 9/8 where his creatinine had worsened to 6.6 with a potassium of 2.9, albumin 2.6.  Outpatient arrangements were being made for renal biopsy, which was tentatively scheduled for 9/15.  Patient tells me that he has longstanding issues surrounding gastroparesis including nausea/vomiting, anorexia, intermittent disrupted taste.  Denies petechia/purpura, hemoptysis, epistaxis.  No use of nonsteroidals.  No lower urinary tract symptoms.  No dyspnea at the current time, lying flat in the bed.   Creat (mg/dL)  Date Value  09/07/2013 2.22 (H)  11/17/2012 2.09 (H)  02/24/2012 2.15 (H)   Creatinine, Ser (mg/dL)  Date Value  09/09/2019 6.97 (H)  09/08/2019 7.10 (H)  09/08/2019 6.86 (H)  09/08/2019 6.86 (H)  10/22/2018 2.09 (H)  ] Balance of 12 systems is negative w/ exceptions as above  PMH  Past Medical History:  Diagnosis Date  . Asthma   . Cancer ([place])   .  Cataract   . CKD (chronic kidney disease), stage III ([place])   . Diabetes mellitus   . Glaucoma    PSH  Past Surgical History:  Procedure Laterality Date  . COLONOSCOPY    . UPPER GASTROINTESTINAL ENDOSCOPY  02/2019   Dr Benson [place]     FH  Family History  Problem Relation Age of Onset  . Cancer Father    SH  reports that he has been smoking cigarettes. He has been smoking about 0.80 packs per day. He has never used smokeless tobacco. He reports that he does not drink alcohol or use drugs. Allergies No Known Allergies Home medications Prior to Admission medications   Medication Sig Start Date End Date Taking? Authorizing Provider  aspirin EC 81 MG tablet Take 81 mg by mouth as needed for mild pain.   Yes [provider]  bumetanide (BUMEX) 2 MG tablet Take 4 mg by mouth 2 (two) times daily.   Yes [provider]  glipiZIDE (GLUCOTROL XL) 10 MG 24 hr tablet Take 10 mg by mouth daily with breakfast.   Yes [provider]  omeprazole (PRILOSEC) 40 MG capsule Take 40 mg by mouth daily.   Yes [provider]  Vitamin D, Ergocalciferol, (DRISDOL) 1.25 MG (50000 UT) CAPS capsule Take 1 capsule by mouth once a week. Sundays 02/17/19  Yes [provider]  amoxicillin-clavulanate (AUGMENTIN) 875-125 MG tablet Take 1 tablet by mouth 2 (two) times daily. One po bid x 7 days Patient not taking: Reported on 09/08/2019  10/23/18   Jacqlyn Larsen, PA-C  doxycycline (VIBRAMYCIN) 100 MG capsule Take 1 capsule (100 mg total) by mouth 2 (two) times daily. One po bid x 7 days Patient not taking: Reported on 09/08/2019 10/23/18   Jacqlyn Larsen, PA-C  Lancet Devices (AUTO-LANCETS) MISC 100 Units by Does not apply route every morning. Patient not taking: Reported on 09/08/2019 02/24/12   Robyn Haber, MD  losartan (COZAAR) 50 MG tablet TAKE 1 TABLET BY MOUTH EVERY DAY Patient not taking: Reported on 09/08/2019 11/14/13   Theda Sers, PA-C  sitaGLIPtin (JANUVIA) 50 MG  tablet Take 1 tablet (50 mg total) by mouth daily. Patient not taking: Reported on 09/08/2019 11/10/15   Copland, Gay Filler, MD  traMADol (ULTRAM) 50 MG tablet Take 1 tablet (50 mg total) by mouth every 8 (eight) hours as needed for pain. Patient not taking: Reported on 09/08/2019 09/07/13   Copland, Gay Filler, MD    Current Medications Scheduled Meds: . [START ON 09/10/2019] heparin  5,000 Units Subcutaneous Q8H  . insulin aspart  0-5 Units Subcutaneous QHS  . insulin aspart  0-9 Units Subcutaneous TID WC  . loperamide  4 mg Oral Once   Continuous Infusions: PRN Meds:.acetaminophen **OR** acetaminophen, albuterol  CBC Recent Labs  Lab 09/08/19 1710 09/08/19 2140  WBC 9.5  --   NEUTROABS 5.8  --   HGB 9.2* 8.8*  HCT 26.3* 26.0*  MCV 87.1  --   PLT 243  --    Basic Metabolic Panel Recent Labs  Lab 09/08/19 1710 09/08/19 2130 09/08/19 2140 09/09/19 1049  NA 137 138 138 138  K 2.8* 2.9* 3.2* 3.6  CL 101 100 100 101  CO2 24 23  --  23  GLUCOSE 192* 169* 197* 182*  BUN 57* 58* 52* 62*  CREATININE 6.86* 6.86* 7.10* 6.97*  CALCIUM 7.5* 7.6*  --  7.3*  PHOS  --  6.8*  --   --     Physical Exam  Blood pressure (!) 164/91, pulse 94, temperature 98.4 F (36.9 C), temperature source Oral, resp. rate 18, SpO2 100 %. GEN: Chronically ill-appearing, NAD, conversant ENT: NCAT EYES: EOMI, CV: Regular, normal S1 and S2, no rub PULM: Clear bilaterall ABD: Soft, nontender, no abdominal bruits, no hepatosplenomegaly SKIN: No rashes/lesions EXT: 2-3+ lower extremity edema, ending distal to knees Nonfocal, CN II through XII grossly intact  Assessment 36M with progressive renal failure and nephrotic proteinuria.  Also with some degree of hematuria.  This could be diabetic nephropathy, in fact it probably is.  However, given the rapid progression, I agree with evaluation for other, more treatable causes.  Work-up thus far has been negative with the exception of a depressed C4.  He has  significant abdominal symptomatology, partially related to gastroparesis, but I do worry that he has having indolent onset of uremia.  1. Progressive renal failure, hematuria, proteinuria: Largely unrevealing work-up including negative ANA, ANCA, SPEP, serum free light chains, phospholipase A2 receptor antibody; did have low C4 but normal C3.  Agree that needs renal biopsy, will try to facilitate for tomorrow. 2. Hypervolemia/hypertension: Somewhat volume overloaded, therefore the degree of proteinuria present, surprising if not worse patient states intolerant of furosemide and torsemide, both of which cause diarrhea.  Will treat with IV bumetanide, 2 mg twice daily at the current time. 3. DM2 with microvascular disease including proliferative retinopathy and neuropathy, has gastroparesis 4. Anemia, Hb 9.2; TSAT 14$ and Ferritin 170, likely related to chronic disease. ASx 5.  Hypokalemia, likely related to poor intake and diuresis  Plan 1. Plan for renal biopsy, will consult IR 2. Repeat complement levels, re-quantify proteinuria 3. HIV 4. Type and Screen, PT and PTT 5. Bumex 2mg  IV BID, 6. BID BMP 7. Potentially approaching symptomatic renal failure, uremia, began conversation regarding dialysis.  Continue to discuss 8. Daily weights, Daily Renal Panel, Strict I/Os, Avoid nephrotoxins (NSAIDs, judicious IV Contrast)    Rexene Agent  [phone] pgr 09/09/2019, 12:58 PM

## 2019-09-09 NOTE — ED Notes (Signed)
Pt ambulated to bathroom independently

## 2019-09-09 NOTE — Plan of Care (Signed)
  Problem: Education: Goal: Knowledge of General Education information will improve Description Including pain rating scale, medication(s)/side effects and non-pharmacologic comfort measures Outcome: Progressing   

## 2019-09-09 NOTE — ED Notes (Signed)
Pt states can't give urine specimen at this time

## 2019-09-10 ENCOUNTER — Inpatient Hospital Stay (HOSPITAL_COMMUNITY): Payer: BC Managed Care – PPO

## 2019-09-10 LAB — BASIC METABOLIC PANEL
Anion gap: 10 (ref 5–15)
BUN: 64 mg/dL — ABNORMAL HIGH (ref 8–23)
CO2: 24 mmol/L (ref 22–32)
Calcium: 7.4 mg/dL — ABNORMAL LOW (ref 8.9–10.3)
Chloride: 105 mmol/L (ref 98–111)
Creatinine, Ser: 6.85 mg/dL — ABNORMAL HIGH (ref 0.61–1.24)
GFR calc Af Amer: 9 mL/min — ABNORMAL LOW (ref >60)
GFR calc non Af Amer: 8 mL/min — ABNORMAL LOW (ref >60)
Glucose, Bld: 185 mg/dL — ABNORMAL HIGH (ref 70–99)
Potassium: 3.3 mmol/L — ABNORMAL LOW (ref 3.5–5.1)
Sodium: 139 mmol/L (ref 135–145)

## 2019-09-10 LAB — IRON AND TIBC
Iron: 41 ug/dL — ABNORMAL LOW (ref 45–182)
Saturation Ratios: 27 % (ref 17.9–39.5)
TIBC: 153 ug/dL — ABNORMAL LOW (ref 250–450)
UIBC: 112 ug/dL

## 2019-09-10 LAB — GLUCOSE, CAPILLARY
Glucose-Capillary: 148 mg/dL — ABNORMAL HIGH (ref 70–99)
Glucose-Capillary: 187 mg/dL — ABNORMAL HIGH (ref 70–99)
Glucose-Capillary: 198 mg/dL — ABNORMAL HIGH (ref 70–99)
Glucose-Capillary: 234 mg/dL — ABNORMAL HIGH (ref 70–99)

## 2019-09-10 LAB — CBC WITH DIFFERENTIAL/PLATELET
Abs Immature Granulocytes: 0.02 10*3/uL (ref 0.00–0.07)
Basophils Absolute: 0 10*3/uL (ref 0.0–0.1)
Basophils Relative: 0 %
Eosinophils Absolute: 1.3 10*3/uL — ABNORMAL HIGH (ref 0.0–0.5)
Eosinophils Relative: 18 %
HCT: 22.2 % — ABNORMAL LOW (ref 39.0–52.0)
Hemoglobin: 7.6 g/dL — ABNORMAL LOW (ref 13.0–17.0)
Immature Granulocytes: 0 %
Lymphocytes Relative: 19 %
Lymphs Abs: 1.3 10*3/uL (ref 0.7–4.0)
MCH: 30.2 pg (ref 26.0–34.0)
MCHC: 34.2 g/dL (ref 30.0–36.0)
MCV: 88.1 fL (ref 80.0–100.0)
Monocytes Absolute: 0.5 10*3/uL (ref 0.1–1.0)
Monocytes Relative: 7 %
Neutro Abs: 3.7 10*3/uL (ref 1.7–7.7)
Neutrophils Relative %: 56 %
Platelets: 185 10*3/uL (ref 150–400)
RBC: 2.52 MIL/uL — ABNORMAL LOW (ref 4.22–5.81)
RDW: 13.7 % (ref 11.5–15.5)
WBC: 6.8 10*3/uL (ref 4.0–10.5)
nRBC: 0 % (ref 0.0–0.2)

## 2019-09-10 LAB — PROTIME-INR
INR: 1.1 (ref 0.8–1.2)
Prothrombin Time: 14.1 seconds (ref 11.4–15.2)

## 2019-09-10 LAB — FOLATE: Folate: 9.5 ng/mL (ref >5.9)

## 2019-09-10 LAB — VITAMIN B12: Vitamin B-12: 229 pg/mL (ref 180–914)

## 2019-09-10 LAB — RETICULOCYTES
Immature Retic Fract: 5.8 % (ref 2.3–15.9)
RBC.: 2.37 MIL/uL — ABNORMAL LOW (ref 4.22–5.81)
Retic Count, Absolute: 33.7 10*3/uL (ref 19.0–186.0)
Retic Ct Pct: 1.4 % (ref 0.4–3.1)

## 2019-09-10 LAB — C4 COMPLEMENT: Complement C4, Body Fluid: <2 mg/dL — ABNORMAL LOW (ref 14–44)

## 2019-09-10 LAB — HEMOGLOBIN A1C
Hgb A1c MFr Bld: 6.5 % — ABNORMAL HIGH (ref 4.8–5.6)
Mean Plasma Glucose: 140 mg/dL

## 2019-09-10 LAB — HIV ANTIBODY (ROUTINE TESTING W REFLEX): HIV Screen 4th Generation wRfx: NONREACTIVE

## 2019-09-10 LAB — FERRITIN: Ferritin: 128 ng/mL (ref 24–336)

## 2019-09-10 LAB — C3 COMPLEMENT: C3 Complement: 86 mg/dL (ref 82–167)

## 2019-09-10 IMAGING — US US BIOPSY
1 series · 7 of 7 positions shown · non-contrast
Comparison: none

INDICATION: 63-year-old male with a history chronic kidney disease/nephrotic
syndrome

[Series 1: us biopsy · 7 of 7 slices shown]
[im 1/7]
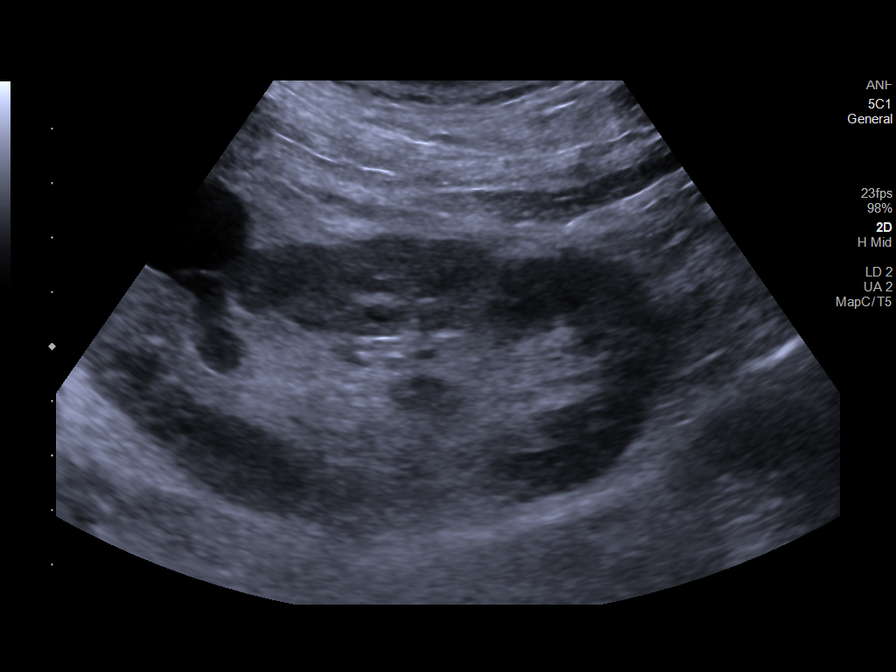
[im 2/7]
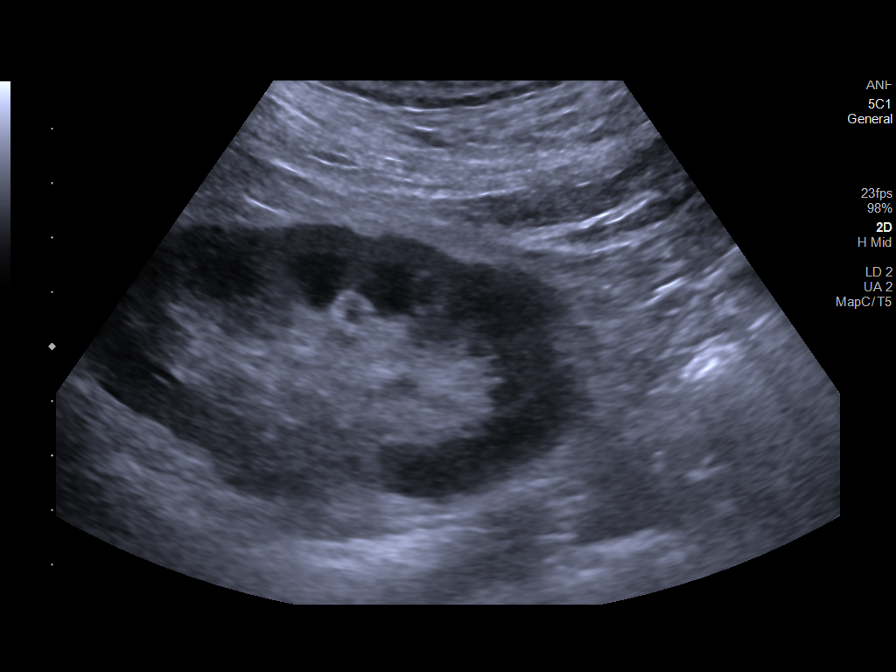
[im 3/7]
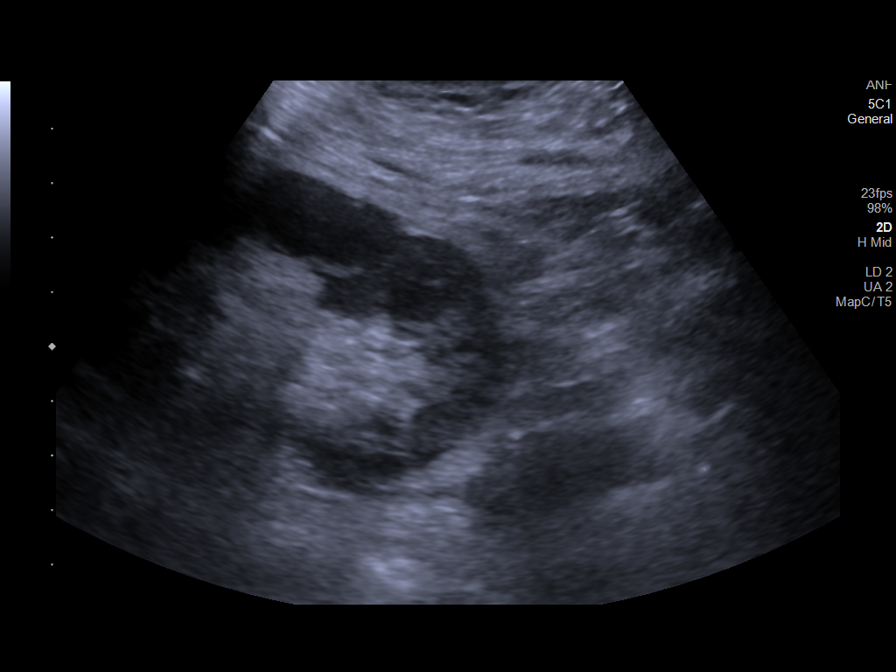
[im 4/7]
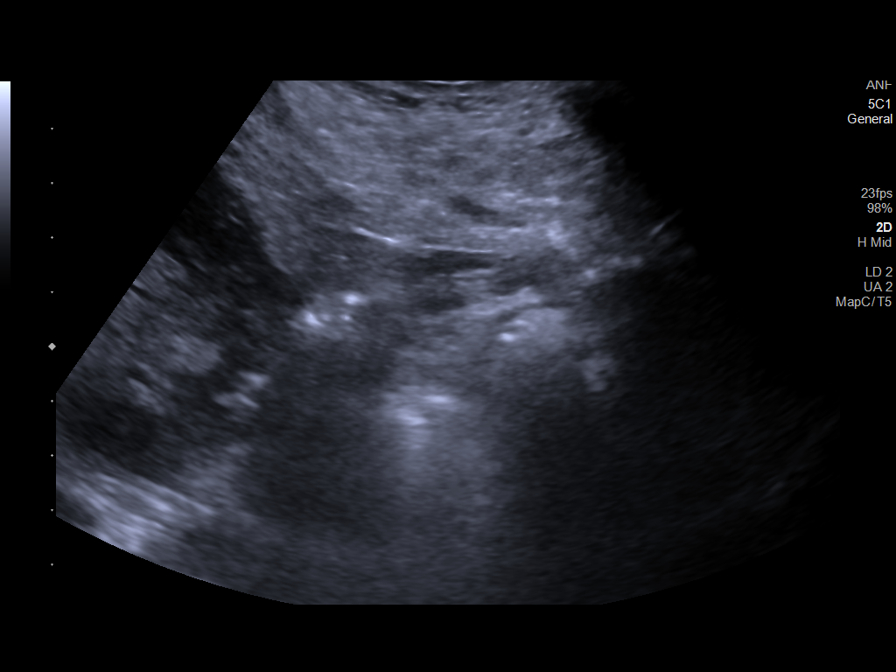
[im 5/7]
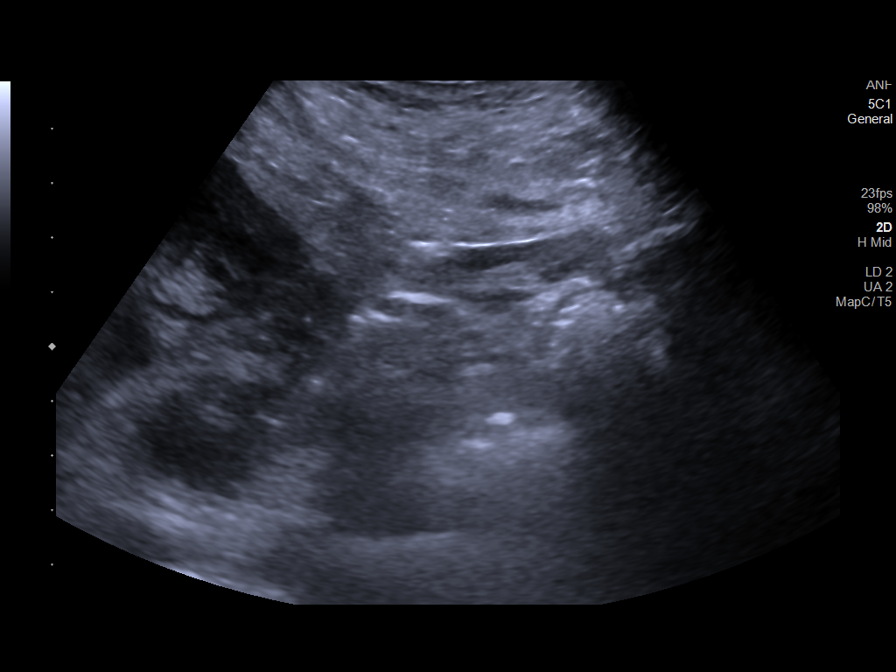
[im 6/7]
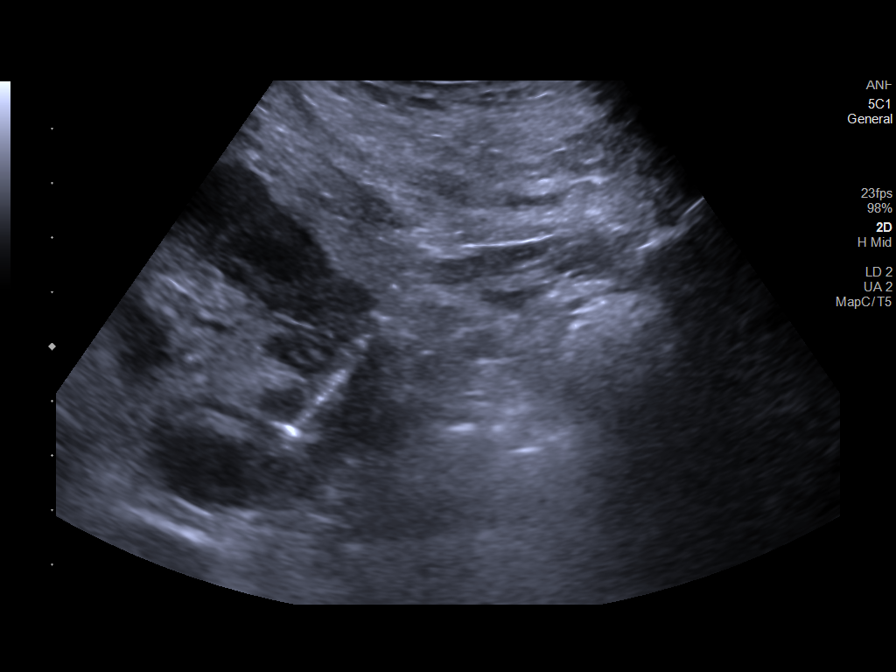
[im 7/7]
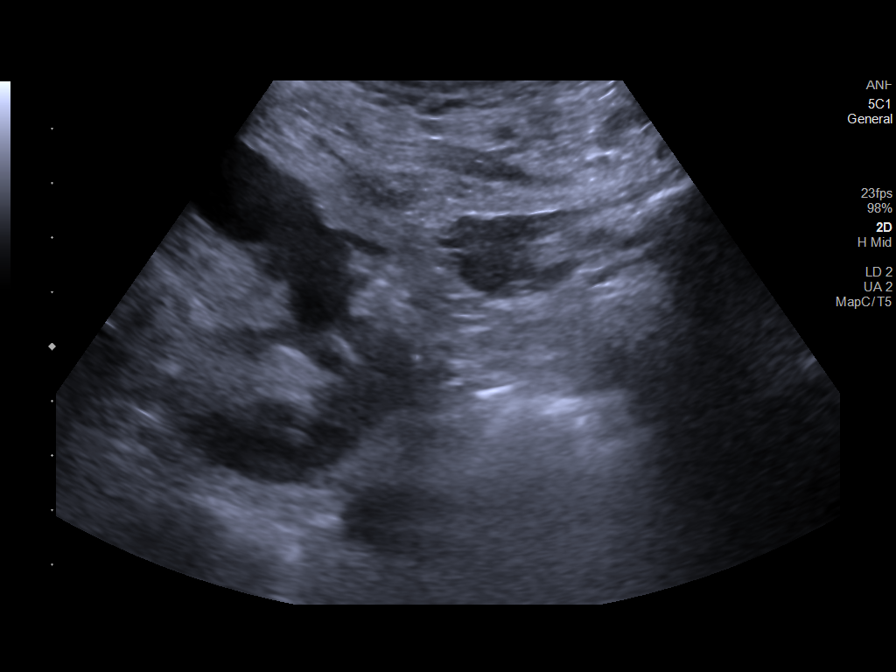

[7 of 7 positions shown; findings below may reference images not displayed]

EXAM:
ULTRASOUND-GUIDED MEDICAL RENAL BIOPSY

MEDICATIONS:
None.

ANESTHESIA/SEDATION:
Moderate (conscious) sedation was employed during this procedure. A
total of Versed 1.0 mg and Fentanyl 50 mcg was administered
intravenously.

Moderate Sedation Time: 10 minutes. The patient's level of
consciousness and vital signs were monitored continuously by
radiology nursing throughout the procedure under my direct
supervision.

FLUOROSCOPY TIME:  None

COMPLICATIONS:
None

PROCEDURE:
Informed written consent was obtained from the patient after a
thorough discussion of the procedural risks, benefits and
alternatives. All questions were addressed. Maximal Sterile Barrier
Technique was utilized including caps, mask, sterile gowns, sterile
gloves, sterile drape, hand hygiene and skin antiseptic. A timeout
was performed prior to the initiation of the procedure.

Patient was positioned prone position on the gantry table. Images
were stored sent to PACs.

Once the patient is prepped and draped in the usual sterile fashion,
the skin and subcutaneous tissues overlying the left kidney were
generously infiltrated 1% lidocaine for local anesthesia.

Using ultrasound guidance, a 15 gauge guide needle was advanced into
the lower cortex of the left kidney.

Once we confirmed location of the needle tip, 2 separate 16 gauge
core biopsy were achieved.

Two Gel-Foam pledgets were infused with a small amount of saline.
The needle was removed.

Final images were stored.

The patient tolerated the procedure well and remained
hemodynamically stable throughout.

No complications were encountered and no significant blood loss
encountered.
IMPRESSION: Status post ultrasound-guided medical renal biopsy, left kidney.

## 2019-09-10 MED ORDER — GELATIN ABSORBABLE 12-7 MM EX MISC
CUTANEOUS | Status: AC
  Filled 2019-09-10: qty 1

## 2019-09-10 MED ORDER — FENTANYL CITRATE (PF) 100 MCG/2ML IJ SOLN
INTRAMUSCULAR | Status: AC | PRN
  Administered 2019-09-10: 50 ug via INTRAVENOUS

## 2019-09-10 MED ORDER — FERROUS SULFATE 325 (65 FE) MG PO TABS
325.0000 mg | ORAL_TABLET | Freq: Every day | ORAL | Status: DC
  Administered 2019-09-11 – 2019-09-13 (×3): 325 mg via ORAL
  Filled 2019-09-10 (×4): qty 1

## 2019-09-10 MED ORDER — MIDAZOLAM HCL 2 MG/2ML IJ SOLN
INTRAMUSCULAR | Status: AC | PRN
  Administered 2019-09-10: 1 mg via INTRAVENOUS

## 2019-09-10 MED ORDER — LIDOCAINE HCL (PF) 1 % IJ SOLN
INTRAMUSCULAR | Status: AC
  Filled 2019-09-10: qty 30

## 2019-09-10 MED ORDER — NICOTINE 21 MG/24HR TD PT24
21.0000 mg | MEDICATED_PATCH | Freq: Every day | TRANSDERMAL | Status: DC
  Administered 2019-09-10 – 2019-09-14 (×5): 21 mg via TRANSDERMAL
  Filled 2019-09-10 (×6): qty 1

## 2019-09-10 MED ORDER — FENTANYL CITRATE (PF) 100 MCG/2ML IJ SOLN
INTRAMUSCULAR | Status: AC
  Filled 2019-09-10: qty 2

## 2019-09-10 MED ORDER — MIDAZOLAM HCL 2 MG/2ML IJ SOLN
INTRAMUSCULAR | Status: AC
  Filled 2019-09-10: qty 2

## 2019-09-10 MED ORDER — POTASSIUM CHLORIDE CRYS ER 20 MEQ PO TBCR
40.0000 meq | EXTENDED_RELEASE_TABLET | Freq: Once | ORAL | Status: AC
  Administered 2019-09-10: 40 meq via ORAL
  Filled 2019-09-10: qty 2

## 2019-09-10 NOTE — Progress Notes (Signed)
[facility]                                                                              Patient Demographics  Duane Hall, is a 63 y.o. male, DOB - 09-22-1956, YCX:[id number]  Admit date - 09/08/2019   Admitting Physician Shela Leff, MD  Outpatient Primary MD for the patient is Heywood Bene, PA-C  Outpatient specialists:   LOS - 2  days   Medical records reviewed and are as summarized below:    Chief Complaint  Patient presents with  . Leg Swelling       Brief summary   Patient is a 63 year old male with CKD stage III, asthma, diabetes type 2 (follows Dr. Delrae Rend), presented to ED with bilateral lower extremity edema.  Patient was seen earlier on the day of admission by Dr. Johnney Ou and was sent to ED for evaluation of worsening renal function.  Patient reported 57-month history of progressively worsening bilateral lower extremity edema.  Per patient he was scheduled for renal biopsy next week however on the day of admission, his nephrologist was concerned about his renal function.  Patient also reports that he was started on diuretic, however states that it gives him diarrhea. BUN 57, creatinine 6.86 on admission, baseline 2.09 in 09/2018  Assessment & Plan    Principal Problem:   Volume overload secondary to likely progression of CKD stage III -Patient has a history of baseline CKD, stage III, presenting with significant bilateral lower extremity edema, was started on diuretic outpatient however states that it gives him diarrhea so unclear if he was actually compliant with with the diuretic, has early symptoms of uremia -Chest x-ray showed small bilateral pleural effusions, BNP 438 -Creatinine plateaued at 7.1, now trending down to 6.8 today.  Nephrology following.  -Continue to hold losartan, PPI  -Renal ultrasound showed chronic medical renal disease, no obstruction or hydronephrosis.  Thickened irregular bladder wall could be cystitis,  infiltrating malignancy cannot be excluded, cystoscopy if clinically indicated. -UA negative for nitrites, leukocytes -IR consulted, renal biopsy today.  Work-up so far negative with exception of decreased C4   Active Problems:   Type II diabetes mellitus (HCC) NIDDM with renal complications, diabetic neuropathy, retinopathy -Hemoglobin A1c 6.5.  Continue sliding scale insulin, hold oral hypoglycemics.   -CBGs 180s to 190s, will change to sliding scale insulin moderate.   Normocytic anemia likely related to CKD stage III -Hb 7.6, was 11.1 in 09/2018, ESA per nephrology -Hemoglobin trending down, will obtain FOBT, anemia panel, packed RBC transfusion if okay with nephrology     Asthma -Currently stable, no wheezing  Hypokalemia -Likely due to diuretic use at home, diarrhea -Presented with potassium of 2.8, was replaced, will recheck bmet today  Diarrhea -Patient reports that he gets diarrhea after taking the diuretic at home.  No fevers or chills, no leukocytosis, no abdominal tenderness. -Reports no diarrhea today  Hypokalemia Replaced   Code Status: full code DVT Prophylaxis: Heparin subcu Family Communication: Discussed all imaging results, lab results, explained to the patient   Disposition Plan: Remains inpatient, creatinine still elevated, renal biopsy today.  May need to  initiate dialysis.  Time Spent in minutes 35 minutes  Procedures:  Renal ultrasound Renal biopsy  Consultants:   Nephrology Intervention radiology  Antimicrobials:   Anti-infectives (From admission, onward)   None         Medications  Scheduled Meds: . bumetanide (BUMEX) IV  2 mg Intravenous BID  . fentaNYL      . gelatin adsorbable      . heparin  5,000 Units Subcutaneous Q8H  . insulin aspart  0-5 Units Subcutaneous QHS  . insulin aspart  0-9 Units Subcutaneous TID WC  . lidocaine (PF)      . loperamide  4 mg Oral Once  . midazolam      . nicotine  21 mg Transdermal Daily    Continuous Infusions: PRN Meds:.acetaminophen **OR** acetaminophen, albuterol      Subjective:   Duane Hall was seen and examined today.  No complaints today, somewhat sleepy at the time of my encounter.  Denies any fever chills or diarrhea overnight.  Patient denies dizziness, chest pain, shortness of breath. No acute events overnight.    Objective:   Vitals:   09/10/19 0936 09/10/19 0950 09/10/19 0955 09/10/19 0958  BP: (!) 153/73 (!) 145/74 137/69 131/65  Pulse: 92 87 84 83  Resp: 17 14 14 10   Temp:      TempSrc:      SpO2: 94% 100% 100% 96%    Intake/Output Summary (Last 24 hours) at 09/10/2019 1003 Last data filed at 09/10/2019 0455 Gross per 24 hour  Intake 0 ml  Output -  Net 0 ml     Wt Readings from Last 3 Encounters:  03/24/19 55 kg  10/22/18 54 kg  11/08/15 83.4 kg   Physical Exam  General: Alert and oriented x 3, NAD  Eyes:   HEENT:  Atraumatic, normocephalic  Cardiovascular: S1 S2 clear, no murmurs, RRR. No pedal edema b/l  Respiratory: CTAB, no wheezing, rales or rhonchi  Gastrointestinal: Soft, mild diffuse TTP, nondistended, NBS  Ext: 3+ pedal edema bilaterally  Neuro: no new deficits  Musculoskeletal: No cyanosis, clubbing  Skin: No rashes  Psych: Normal affect and demeanor, alert and oriented x3    Data Reviewed:  I have personally reviewed following labs and imaging studies  Micro Results Recent Results (from the past 240 hour(s))  SARS Coronavirus 2 [facility] order, Performed in [facility] hospital lab) Nasopharyngeal Nasopharyngeal Swab     Status: None   Collection Time: 09/08/19  9:33 PM   Specimen: Nasopharyngeal Swab  Result Value Ref Range Status   SARS Coronavirus 2 NEGATIVE NEGATIVE Final    Comment: (NOTE) If result is NEGATIVE SARS-CoV-2 target nucleic acids are NOT DETECTED. The SARS-CoV-2 RNA is generally detectable in upper and lower  respiratory specimens during the acute phase of infection. The lowest   concentration of SARS-CoV-2 viral copies this assay can detect is 250  copies / mL. A negative result does not preclude SARS-CoV-2 infection  and should not be used as the sole basis for treatment or other  patient management decisions.  A negative result may occur with  improper specimen collection / handling, submission of specimen other  than nasopharyngeal swab, presence of viral mutation(s) within the  areas targeted by this assay, and inadequate number of viral copies  (<250 copies / mL). A negative result must be combined with clinical  observations, patient history, and epidemiological information. If result is POSITIVE SARS-CoV-2 target nucleic acids are DETECTED. The SARS-CoV-2 RNA is generally  detectable in upper and lower  respiratory specimens dur ing the acute phase of infection.  Positive  results are indicative of active infection with SARS-CoV-2.  Clinical  correlation with patient history and other diagnostic information is  necessary to determine patient infection status.  Positive results do  not rule out bacterial infection or co-infection with other viruses. If result is PRESUMPTIVE POSTIVE SARS-CoV-2 nucleic acids MAY BE PRESENT.   A presumptive positive result was obtained on the submitted specimen  and confirmed on repeat testing.  While 2019 novel coronavirus  (SARS-CoV-2) nucleic acids may be present in the submitted sample  additional confirmatory testing may be necessary for epidemiological  and / or clinical management purposes  to differentiate between  SARS-CoV-2 and other Sarbecovirus currently known to infect humans.  If clinically indicated additional testing with an alternate test  methodology [id number]) is advised. The SARS-CoV-2 RNA is generally  detectable in upper and lower respiratory sp ecimens during the acute  phase of infection. The expected result is Negative. Fact Sheet for Patients:  StrictlyIdeas.no Fact Sheet  for Healthcare Providers: BankingDealers.co.za This test is not yet approved or cleared by the [place] FDA and has been authorized for detection and/or diagnosis of SARS-CoV-2 by FDA under an Emergency Use Authorization (EUA).  This EUA will remain in effect (meaning this test can be used) for the duration of the COVID-19 declaration under Section 564(b)(1) of the Act, 21 U.S.C. section 360bbb-3(b)(1), unless the authorization is terminated or revoked sooner. Performed at [facility], [place] [address], [place], [place] [address]     Radiology Reports Dg Chest 2 View  Result Date: 09/08/2019 CLINICAL DATA:  Bilateral lower extremity swelling x2 months EXAM: CHEST - 2 VIEW COMPARISON:  CT chest dated 02/24/2019 FINDINGS: Small bilateral pleural effusions.  No Mcclellan interstitial edema. Mild bibasilar opacities, likely atelectasis. No pneumothorax. The heart is normal in size.  Thoracic aortic atherosclerosis. Visualized osseous structures are within normal limits. IMPRESSION: Small bilateral pleural effusions.  No [place]zie interstitial edema. Electronically Signed   By: Julian Hy M.D.   On: 09/08/2019 18:06   US Renal  Result Date: 09/08/2019 CLINICAL DATA:  Chronic kidney disease EXAM: RENAL / URINARY TRACT ULTRASOUND COMPLETE COMPARISON:  10/08/2013 FINDINGS: Right Kidney: Renal measurements: 10.1 x 5.7 x 5.1 cm = volume: 155 mL. Mildly increased echotexture diffusely. No mass or hydronephrosis. Left Kidney: Renal measurements: 10.3 x 4.8 x 4.9 cm = volume: 127 mL. Diffusely increased echotexture. No hydronephrosis. 2.7 cm cyst in the upper pole. No hydronephrosis. Bladder: Diffusely thickened irregular bladder wall. IMPRESSION: Increased echotexture in the kidneys bilaterally compatible with chronic medical renal disease. No hydronephrosis. Thickened, irregular bladder wall. While this could be related to cystitis, infiltrating malignancy cannot be excluded.  Recommend correlation urinalysis and possible cystoscopy if felt clinically indicated. Electronically Signed   By: Rolm Baptise M.D.   On: 09/08/2019 23:57    Lab Data:  CBC: Recent Labs  Lab 09/08/19 1710 09/08/19 2140 09/10/19 0340  WBC 9.5  --  6.8  NEUTROABS 5.8  --  3.7  HGB 9.2* 8.8* 7.6*  HCT 26.3* 26.0* 22.2*  MCV 87.1  --  88.1  PLT 243  --  568   Basic Metabolic Panel: Recent Labs  Lab 09/08/19 1710 09/08/19 2130 09/08/19 2140 09/09/19 1049 09/10/19 0340  NA 137 138 138 138 139  K 2.8* 2.9* 3.2* 3.6 3.3*  CL 101 100 100 101 105  CO2 24 23  --  23 24  GLUCOSE 192* 169* 197* 182* 185*  BUN 57* 58* 52* 62* 64*  CREATININE 6.86* 6.86* 7.10* 6.97* 6.85*  CALCIUM 7.5* 7.6*  --  7.3* 7.4*  MG  --  1.7  --   --   --   PHOS  --  6.8*  --   --   --    GFR: CrCl cannot be calculated (Unknown ideal weight.). Liver Function Tests: Recent Labs  Lab 09/08/19 1710 09/08/19 2130  AST 12*  --   ALT 11  --   ALKPHOS 106  --   BILITOT 0.2*  --   PROT 4.9*  --   ALBUMIN 2.4* 2.3*   No results for input(s): LIPASE, AMYLASE in the last 168 hours. No results for input(s): AMMONIA in the last 168 hours. Coagulation Profile: Recent Labs  Lab 09/09/19 1428 09/10/19 0340  INR 1.1 1.1   Cardiac Enzymes: No results for input(s): CKTOTAL, CKMB, CKMBINDEX, TROPONINI in the last 168 hours. BNP (last 3 results) No results for input(s): PROBNP in the last 8760 hours. HbA1C: Recent Labs    09/08/19 2130  HGBA1C 6.5*   CBG: Recent Labs  Lab 09/09/19 0854 09/09/19 1339 09/09/19 1845 09/09/19 2119 09/10/19 0802  GLUCAP 227* 142* 159* 151* 148*   Lipid Profile: No results for input(s): CHOL, HDL, LDLCALC, TRIG, CHOLHDL, LDLDIRECT in the last 72 hours. Thyroid Function Tests: No results for input(s): TSH, T4TOTAL, FREET4, T3FREE, THYROIDAB in the last 72 hours. Anemia Panel: Recent Labs    09/08/19 2130  FERRITIN 170  TIBC 195*  IRON 28*   Urine analysis:     Component Value Date/Time   COLORURINE YELLOW 09/09/2019 0343   APPEARANCEUR CLEAR 09/09/2019 0343   LABSPEC 1.009 09/09/2019 0343   PHURINE 5.0 09/09/2019 0343   GLUCOSEU 50 (A) 09/09/2019 0343   HGBUR MODERATE (A) 09/09/2019 0343   BILIRUBINUR NEGATIVE 09/09/2019 0343   KETONESUR NEGATIVE 09/09/2019 0343   PROTEINUR >=300 (A) 09/09/2019 0343   NITRITE NEGATIVE 09/09/2019 0343   LEUKOCYTESUR NEGATIVE 09/09/2019 0343     Ripudeep Rai M.D. [facility] 09/10/2019, 10:03 AM  Pager: [phone] Between 7am to 7pm - call Pager - [phone]  After 7pm go to [email or web address] - password [email or web address]  Call night coverage person covering after 7pm

## 2019-09-10 NOTE — Progress Notes (Signed)
[facility]    NEPHROLOGY PROGRESS NOTE  SUBJECTIVE: Patient seen earlier this morning immediately after renal biopsy.  Complained of minimal pain.  Denies chest pain, shortness of breath, nausea, vomiting, diarrhea or dysuria.  Wants to go home.  All other review of systems are negative.   OBJECTIVE:  Vitals:   09/10/19 1017 09/10/19 1235  BP: 136/64 (!) 149/64  Pulse: 87 88  Resp: 18 18  Temp: 98.4 F (36.9 C) 98.6 F (37 C)  SpO2: 99% 99%    Intake/Output Summary (Last 24 hours) at 09/10/2019 1755 Last data filed at 09/10/2019 1233 Gross per 24 hour  Intake 480 ml  Output -  Net 480 ml      General:  AAOx3 NAD HEENT: MMM [place] AT anicteric sclera Neck:  No JVD, no adenopathy CV:  Heart RRR  Lungs:  L/S CTA bilaterally Abd:  abd SNT/ND with normal BS GU:  Bladder non-palpable Extremities: +2 bilateral lower extremity edema Skin:  No skin rash  MEDICATIONS:  . bumetanide (BUMEX) IV  2 mg Intravenous BID  . fentaNYL      . gelatin adsorbable      . heparin  5,000 Units Subcutaneous Q8H  . insulin aspart  0-5 Units Subcutaneous QHS  . insulin aspart  0-9 Units Subcutaneous TID WC  . lidocaine (PF)      . loperamide  4 mg Oral Once  . midazolam      . nicotine  21 mg Transdermal Daily       LABS:   CBC Latest Ref Rng & Units 09/10/2019 09/08/2019 09/08/2019  WBC 4.0 - 10.5 K/uL 6.8 - 9.5  Hemoglobin 13.0 - 17.0 g/dL 7.6(L) 8.8(L) 9.2(L)  Hematocrit 39.0 - 52.0 % 22.2(L) 26.0(L) 26.3(L)  Platelets 150 - 400 K/uL 185 - 243    CMP Latest Ref Rng & Units 09/10/2019 09/09/2019 09/08/2019  Glucose 70 - 99 mg/dL 185(H) 182(H) 197(H)  BUN 8 - 23 mg/dL 64(H) 62(H) 52(H)  Creatinine 0.61 - 1.24 mg/dL 6.85(H) 6.97(H) 7.10(H)  Sodium 135 - 145 mmol/L 139 138 138  Potassium 3.5 - 5.1 mmol/L 3.3(L) 3.6 3.2(L)  Chloride 98 - 111 mmol/L 105 101 100  CO2 22 - 32 mmol/L 24 23 -  Calcium 8.9 - 10.3 mg/dL 7.4(L) 7.3(L) -  Total Protein 6.5 - 8.1 g/dL - - -  Total  Bilirubin 0.3 - 1.2 mg/dL - - -  Alkaline Phos 38 - 126 U/L - - -  AST 15 - 41 U/L - - -  ALT 0 - 44 U/L - - -    Lab Results  Component Value Date   CALCIUM 7.4 (L) 09/10/2019   CAION 0.98 (L) 09/08/2019   PHOS 6.8 (H) 09/08/2019       Component Value Date/Time   COLORURINE YELLOW 09/09/2019 0343   APPEARANCEUR CLEAR 09/09/2019 0343   LABSPEC 1.009 09/09/2019 0343   PHURINE 5.0 09/09/2019 0343   GLUCOSEU 50 (A) 09/09/2019 0343   HGBUR MODERATE (A) 09/09/2019 0343   BILIRUBINUR NEGATIVE 09/09/2019 0343   KETONESUR NEGATIVE 09/09/2019 0343   PROTEINUR >=300 (A) 09/09/2019 0343   NITRITE NEGATIVE 09/09/2019 0343   LEUKOCYTESUR NEGATIVE 09/09/2019 0343      Component Value Date/Time   TCO2 24 09/08/2019 2140       Component Value Date/Time   IRON 41 (L) 09/10/2019 1044   TIBC 153 (L) 09/10/2019 1044   FERRITIN 128 09/10/2019 1044   IRONPCTSAT 27 09/10/2019 1044  ASSESSMENT/PLAN:     63M with progressive renal failure and nephrotic proteinuria.  Also with some degree of hematuria.  This could be diabetic nephropathy, in fact it probably is.  However, given the rapid progression, renal biopsy performed on 09/10/2019.  1.  Acute renal failure.  Continues to have rapid progression of underlying renal disease.  Biopsy pending.  Serologies negative with the exception of a low C4.  Creatinine stable around 6.8.  No urgent indication for dialysis.  2.  Hypervolemia.  Continue bumetanide 2 mg IV twice daily.  Can transition to p.o. upon discharge.  3.  Hypertension.  Overall stable.  4.  Diabetes mellitus.  Continue tight diabetes control.  Suspect this is the etiology of his worsening renal function.  Suspect a collapsing variant of diabetic nephropathy.  5.  Anemia of chronic kidney disease.  We will add oral iron.  Monitor hemoglobin.   [place], DO, [place]

## 2019-09-10 NOTE — Progress Notes (Signed)
MEDICATION-RELATED CONSULT NOTE   IR Procedure Consult - Anticoagulant/Antiplatelet PTA/Inpatient Med List Review by Pharmacist    Procedure: renal biopsy    Completed: 10:08 on 9/11  Post-Procedural bleeding risk per IR MD assessment:  Standard  Antithrombotic medications on inpatient or PTA profile prior to procedure:     VTE prophylaxis postponed on 9/10 with plan for renal biopsy today   Recommended restart time per IR Post-Procedure Guidelines:    at least 6 hours after procedure  Plan:       Heparin 5000 units SQ q8hrs to begin at 10pm tonight, as previously ordered.  Arty Baumgartner, [place] Pager: [phone] or phone: [phone] 09/10/2019 12:53 PM

## 2019-09-10 NOTE — Procedures (Signed)
Interventional Radiology Procedure Note  Procedure: US guided left medical renal biopsy. .  Complications: None Recommendations:  - Ok to shower tomorrow - Do not submerge for 7 days - Routine care  - ok to advance diet per primary  Signed,  Dulcy Fanny. Earleen Newport, DO

## 2019-09-11 ENCOUNTER — Inpatient Hospital Stay (HOSPITAL_COMMUNITY): Payer: BC Managed Care – PPO

## 2019-09-11 DIAGNOSIS — J4520 Mild intermittent asthma, uncomplicated: Secondary | ICD-10-CM

## 2019-09-11 DIAGNOSIS — N186 End stage renal disease: Secondary | ICD-10-CM

## 2019-09-11 DIAGNOSIS — R609 Edema, unspecified: Secondary | ICD-10-CM

## 2019-09-11 DIAGNOSIS — N185 Chronic kidney disease, stage 5: Secondary | ICD-10-CM

## 2019-09-11 DIAGNOSIS — D638 Anemia in other chronic diseases classified elsewhere: Secondary | ICD-10-CM

## 2019-09-11 DIAGNOSIS — N179 Acute kidney failure, unspecified: Secondary | ICD-10-CM

## 2019-09-11 LAB — CBC
HCT: 21.4 % — ABNORMAL LOW (ref 39.0–52.0)
Hemoglobin: 7.2 g/dL — ABNORMAL LOW (ref 13.0–17.0)
MCH: 30.1 pg (ref 26.0–34.0)
MCHC: 33.6 g/dL (ref 30.0–36.0)
MCV: 89.5 fL (ref 80.0–100.0)
Platelets: 182 10*3/uL (ref 150–400)
RBC: 2.39 MIL/uL — ABNORMAL LOW (ref 4.22–5.81)
RDW: 13.8 % (ref 11.5–15.5)
WBC: 8.1 10*3/uL (ref 4.0–10.5)
nRBC: 0 % (ref 0.0–0.2)

## 2019-09-11 LAB — BASIC METABOLIC PANEL
Anion gap: 10 (ref 5–15)
BUN: 69 mg/dL — ABNORMAL HIGH (ref 8–23)
CO2: 24 mmol/L (ref 22–32)
Calcium: 7.5 mg/dL — ABNORMAL LOW (ref 8.9–10.3)
Chloride: 107 mmol/L (ref 98–111)
Creatinine, Ser: 7.07 mg/dL — ABNORMAL HIGH (ref 0.61–1.24)
GFR calc Af Amer: 9 mL/min — ABNORMAL LOW (ref >60)
GFR calc non Af Amer: 8 mL/min — ABNORMAL LOW (ref >60)
Glucose, Bld: 218 mg/dL — ABNORMAL HIGH (ref 70–99)
Potassium: 3.7 mmol/L (ref 3.5–5.1)
Sodium: 141 mmol/L (ref 135–145)

## 2019-09-11 LAB — GLUCOSE, CAPILLARY
Glucose-Capillary: 208 mg/dL — ABNORMAL HIGH (ref 70–99)
Glucose-Capillary: 146 mg/dL — ABNORMAL HIGH (ref 70–99)
Glucose-Capillary: 259 mg/dL — ABNORMAL HIGH (ref 70–99)
Glucose-Capillary: 161 mg/dL — ABNORMAL HIGH (ref 70–99)

## 2019-09-11 MED ORDER — BUMETANIDE 0.25 MG/ML IJ SOLN
2.0000 mg | Freq: Three times a day (TID) | INTRAMUSCULAR | Status: DC
  Administered 2019-09-11 – 2019-09-12 (×3): 2 mg via INTRAVENOUS
  Filled 2019-09-11 (×5): qty 8

## 2019-09-11 NOTE — Progress Notes (Signed)
PROGRESS NOTE    Duane Hall  KGU:[id number] DOB: Jan 24, 1956 DOA: 09/08/2019 PCP: Heywood Bene, PA-C   Brief Narrative:   63 year old gentleman with prior history of type II by diabetes, asthma, stage III CKD was sent to ED for worsening renal parameters.  On admission patient also reports progressive worsening of the lower extremity swelling and reports intolerance to diuretics (gives him diarrhea). On arrival to ED his creatinine was 6.8, whereas his baseline creatinine is around 2.  Patient seen and examined today he continues to be fluid overloaded with bilateral lower extremity 3+ edema.  Nephrology was consulted.  He underwent kidney biopsy on 09/10/2019 with results pending. Meanwhile his creatinine continues to worsen to 7 today.  Vein mapping ordered and vascular surgery consulted for fistula placement which will be scheduled to be done on Tuesday.   Assessment & Plan:   Principal Problem:   Volume overload Active Problems:   Type II diabetes mellitus (HCC)   Asthma   CKD (chronic kidney disease)   Anemia of chronic disease   Hypokalemia  Acute on stage III CKD Progression of renal disease probably secondary to worsening of diabetic nephropathy. Renal ultrasound showed chronic medical renal disease without any obstruction.   Urine analysis is negative for infection. Nephrology on board and IR consulted and he underwent renal biopsy on 09/10/2019 and results are pending. Further work-up negative with the exception of low C4.  No urgent indication for dialysis. Vein mapping ordered by nephrology and vascular consulted for HD access.  Fluid overload/hypervolemia Bumetanide increased to 2 mg 3 times daily.  Continue with strict intake and output and monitor urine output   Hypertension Blood pressure parameters systolic ranging from 628-315 and diastolic from a 17O to 16W.  No changes in medications.   Type 2 diabetes mellitus. CBG (last 3)  Recent Labs   09/10/19 2041 09/11/19 0737 09/11/19 1148  GLUCAP 234* 208* 146*   Not well controlled with hyperglycemia. Hemo-globin A1c 6.5 Increase sliding scale to moderate SSI.   Anemia of chronic disease/anemia secondary to end-stage renal disease iron deficiency anemia Low iron levels, iron supplementation added Stool for occult blood ordered. Hemoglobin around 7.2, transfuse to keep hemoglobin greater than 7.  Asthma No wheezing heard on exam today   hypokalemia Replaced    DVT prophylaxis: Subcu heparin Code Status: Full code Family Communication:  Pending further work up.  Disposition Plan: pending vascular surgery work up.    Consultants:   Nephrology  Vascular surgery.   IR  Procedures: vein mapping   Antimicrobials: none.   Subjective: Wants to know when he can go home. No new complaints.   Objective: Vitals:   09/10/19 2043 09/11/19 0427 09/11/19 1000 09/11/19 1237  BP: 140/80 (!) 156/86 (!) 152/84 (!) 145/75  Pulse: 98 98 98 93  Resp: 18 18 14 16   Temp: 98.5 F (36.9 C) 98.3 F (36.8 C) 98.6 F (37 C) 98.3 F (36.8 C)  TempSrc: Oral Oral Oral Oral  SpO2: 99% 100% 99% 98%  Weight:   69.7 kg   Height:   5\' 9"  (1.753 m)     Intake/Output Summary (Last 24 hours) at 09/11/2019 1353 Last data filed at 09/11/2019 1000 Gross per 24 hour  Intake 222 ml  Output 102 ml  Net 120 ml   Filed Weights   09/10/19 1017 09/11/19 1000  Weight: 72.1 kg 69.7 kg    Examination:  General exam: Appears calm and comfortable  Respiratory system: diminished  at bases, no wheezing or rhonchi.  Cardiovascular system: S1 & S2 heard, RRR. 3+ leg edema.  Gastrointestinal system: Abdomen is nondistended, soft and nontender. No organomegaly or masses felt. Normal bowel sounds heard. Central nervous system: Alert and oriented. No focal neurological deficits. Extremities:3+ leg edema present.  Skin: No rashes, lesions or ulcers Psychiatry:  Mood & affect appropriate.      Data Reviewed: I have personally reviewed following labs and imaging studies  CBC: Recent Labs  Lab 09/08/19 1710 09/08/19 2140 09/10/19 0340 09/11/19 0340  WBC 9.5  --  6.8 8.1  NEUTROABS 5.8  --  3.7  --   HGB 9.2* 8.8* 7.6* 7.2*  HCT 26.3* 26.0* 22.2* 21.4*  MCV 87.1  --  88.1 89.5  PLT 243  --  185 993   Basic Metabolic Panel: Recent Labs  Lab 09/08/19 1710 09/08/19 2130 09/08/19 2140 09/09/19 1049 09/10/19 0340 09/11/19 0340  NA 137 138 138 138 139 141  K 2.8* 2.9* 3.2* 3.6 3.3* 3.7  CL 101 100 100 101 105 107  CO2 24 23  --  23 24 24   GLUCOSE 192* 169* 197* 182* 185* 218*  BUN 57* 58* 52* 62* 64* 69*  CREATININE 6.86* 6.86* 7.10* 6.97* 6.85* 7.07*  CALCIUM 7.5* 7.6*  --  7.3* 7.4* 7.5*  MG  --  1.7  --   --   --   --   PHOS  --  6.8*  --   --   --   --    GFR: Estimated Creatinine Clearance: 10.8 mL/min (A) (by C-G formula based on SCr of 7.07 mg/dL (H)). Liver Function Tests: Recent Labs  Lab 09/08/19 1710 09/08/19 2130  AST 12*  --   ALT 11  --   ALKPHOS 106  --   BILITOT 0.2*  --   PROT 4.9*  --   ALBUMIN 2.4* 2.3*   No results for input(s): LIPASE, AMYLASE in the last 168 hours. No results for input(s): AMMONIA in the last 168 hours. Coagulation Profile: Recent Labs  Lab 09/09/19 1428 09/10/19 0340  INR 1.1 1.1   Cardiac Enzymes: No results for input(s): CKTOTAL, CKMB, CKMBINDEX, TROPONINI in the last 168 hours. BNP (last 3 results) No results for input(s): PROBNP in the last 8760 hours. HbA1C: Recent Labs    09/08/19 2130  HGBA1C 6.5*   CBG: Recent Labs  Lab 09/10/19 1229 09/10/19 1715 09/10/19 2041 09/11/19 0737 09/11/19 1148  GLUCAP 187* 198* 234* 208* 146*   Lipid Profile: No results for input(s): CHOL, HDL, LDLCALC, TRIG, CHOLHDL, LDLDIRECT in the last 72 hours. Thyroid Function Tests: No results for input(s): TSH, T4TOTAL, FREET4, T3FREE, THYROIDAB in the last 72 hours. Anemia Panel: Recent Labs     09/08/19 2130 09/10/19 1044  VITAMINB12  --  229  FOLATE  --  9.5  FERRITIN 170 128  TIBC 195* 153*  IRON 28* 41*  RETICCTPCT  --  1.4   Sepsis Labs: No results for input(s): PROCALCITON, LATICACIDVEN in the last 168 hours.  Recent Results (from the past 240 hour(s))  SARS Coronavirus 2 [facility] order, Performed in [facility] hospital lab) Nasopharyngeal Nasopharyngeal Swab     Status: None   Collection Time: 09/08/19  9:33 PM   Specimen: Nasopharyngeal Swab  Result Value Ref Range Status   SARS Coronavirus 2 NEGATIVE NEGATIVE Final    Comment: (NOTE) If result is NEGATIVE SARS-CoV-2 target nucleic acids are NOT DETECTED. The SARS-CoV-2 RNA is generally  detectable in upper and lower  respiratory specimens during the acute phase of infection. The lowest  concentration of SARS-CoV-2 viral copies this assay can detect is 250  copies / mL. A negative result does not preclude SARS-CoV-2 infection  and should not be used as the sole basis for treatment or other  patient management decisions.  A negative result may occur with  improper specimen collection / handling, submission of specimen other  than nasopharyngeal swab, presence of viral mutation(s) within the  areas targeted by this assay, and inadequate number of viral copies  (<250 copies / mL). A negative result must be combined with clinical  observations, patient history, and epidemiological information. If result is POSITIVE SARS-CoV-2 target nucleic acids are DETECTED. The SARS-CoV-2 RNA is generally detectable in upper and lower  respiratory specimens dur ing the acute phase of infection.  Positive  results are indicative of active infection with SARS-CoV-2.  Clinical  correlation with patient history and other diagnostic information is  necessary to determine patient infection status.  Positive results do  not rule out bacterial infection or co-infection with other viruses. If result is PRESUMPTIVE POSTIVE SARS-CoV-2  nucleic acids MAY BE PRESENT.   A presumptive positive result was obtained on the submitted specimen  and confirmed on repeat testing.  While 2019 novel coronavirus  (SARS-CoV-2) nucleic acids may be present in the submitted sample  additional confirmatory testing may be necessary for epidemiological  and / or clinical management purposes  to differentiate between  SARS-CoV-2 and other Sarbecovirus currently known to infect humans.  If clinically indicated additional testing with an alternate test  methodology [phone]) is advised. The SARS-CoV-2 RNA is generally  detectable in upper and lower respiratory sp ecimens during the acute  phase of infection. The expected result is Negative. Fact Sheet for Patients:  StrictlyIdeas.no Fact Sheet for Healthcare Providers: BankingDealers.co.za This test is not yet approved or cleared by the [place] FDA and has been authorized for detection and/or diagnosis of SARS-CoV-2 by FDA under an Emergency Use Authorization (EUA).  This EUA will remain in effect (meaning this test can be used) for the duration of the COVID-19 declaration under Section 564(b)(1) of the Act, 21 U.S.C. section 360bbb-3(b)(1), unless the authorization is terminated or revoked sooner. Performed at [facility], [place] [address], [place], [place] [address]          Radiology Studies: US Biopsy (kidney)  Result Date: 09/10/2019 INDICATION: 63 year old male with a history chronic kidney disease/nephrotic syndrome EXAM: ULTRASOUND-GUIDED MEDICAL RENAL BIOPSY MEDICATIONS: None. ANESTHESIA/SEDATION: Moderate (conscious) sedation was employed during this procedure. A total of Versed 1.0 mg and Fentanyl 50 mcg was administered intravenously. Moderate Sedation Time: 10 minutes. The patient's level of consciousness and vital signs were monitored continuously by radiology nursing throughout the procedure under my direct  supervision. FLUOROSCOPY TIME:  None COMPLICATIONS: None PROCEDURE: Informed written consent was obtained from the patient after a thorough discussion of the procedural risks, benefits and alternatives. All questions were addressed. Maximal Sterile Barrier Technique was utilized including caps, mask, sterile gowns, sterile gloves, sterile drape, hand hygiene and skin antiseptic. A timeout was performed prior to the initiation of the procedure. Patient was positioned prone position on the gantry table. Images were stored sent to PACs. Once the patient is prepped and draped in the usual sterile fashion, the skin and subcutaneous tissues overlying the left kidney were generously infiltrated 1% lidocaine for local anesthesia. Using ultrasound guidance, a 15 gauge guide needle  was advanced into the lower cortex of the left kidney. Once we confirmed location of the needle tip, 2 separate 16 gauge core biopsy were achieved. Two Gel-Foam pledgets were infused with a small amount of saline. The needle was removed. Final images were stored. The patient tolerated the procedure well and remained hemodynamically stable throughout. No complications were encountered and no significant blood loss encountered. IMPRESSION: Status post ultrasound-guided medical renal biopsy, left kidney. Signed, Dulcy Fanny. Dellia Nims, RPVI [facility] Radiology Electronically Signed   By: Corrie Mckusick D.O.   On: 09/10/2019 10:46   Vas [place] Upper Ext Vein Mapping (pre-op Avf)  Result Date: 09/11/2019 UPPER EXTREMITY VEIN MAPPING  Indications: Pre-access. Comparison Study: NO PRIOR Performing Technologist: Abram Sander RVS  Examination Guidelines: A complete evaluation includes B-mode imaging, spectral Doppler, color Doppler, and power Doppler as needed of all accessible portions of each vessel. Bilateral testing is considered an integral part of a complete examination. Limited examinations for  reoccurring indications may be performed as noted. +-----------------+-------------+----------+------------------------+  Right Cephalic    Diameter (cm) Depth (cm)         Findings          +-----------------+-------------+----------+------------------------+  Shoulder              0.22         0.58                              +-----------------+-------------+----------+------------------------+  Prox upper arm        0.19         0.49                              +-----------------+-------------+----------+------------------------+  Mid upper arm         0.21         0.32                              +-----------------+-------------+----------+------------------------+  Dist upper arm        0.32         0.28                              +-----------------+-------------+----------+------------------------+  Antecubital fossa     0.34         0.28                              +-----------------+-------------+----------+------------------------+  Prox forearm                               not visualized due to iv  +-----------------+-------------+----------+------------------------+  Mid forearm           0.22         0.29                              +-----------------+-------------+----------+------------------------+  Dist forearm          0.15         0.23                              +-----------------+-------------+----------+------------------------+ +-----------------+-------------+----------+--------------+  Right Basilic     Diameter (cm) Depth (cm)    Findings     +-----------------+-------------+----------+--------------+  Prox upper arm        0.81         0.72                    +-----------------+-------------+----------+--------------+  Mid upper arm         0.51         0.86                    +-----------------+-------------+----------+--------------+  Dist upper arm        0.45         0.46                    +-----------------+-------------+----------+--------------+  Antecubital fossa     0.48          0.56                    +-----------------+-------------+----------+--------------+  Prox forearm          0.17         0.34      branching     +-----------------+-------------+----------+--------------+  Mid forearm           0.16         0.16      branching     +-----------------+-------------+----------+--------------+  Distal forearm                             not visualized  +-----------------+-------------+----------+--------------+  Elbow                                      not visualized  +-----------------+-------------+----------+--------------+ +-----------------+-------------+----------+--------+  Left Cephalic     Diameter (cm) Depth (cm) Findings  +-----------------+-------------+----------+--------+  Shoulder              0.17         0.54              +-----------------+-------------+----------+--------+  Prox upper arm        0.19         0.39              +-----------------+-------------+----------+--------+  Mid upper arm         0.12         0.31              +-----------------+-------------+----------+--------+  Dist upper arm        0.16         0.25              +-----------------+-------------+----------+--------+  Antecubital fossa     0.22         0.35              +-----------------+-------------+----------+--------+  Prox forearm          0.17         0.49              +-----------------+-------------+----------+--------+  Mid forearm           0.13         0.22              +-----------------+-------------+----------+--------+  Dist forearm  0.11         0.34              +-----------------+-------------+----------+--------+ +-----------------+-------------+----------+--------------+  Left Basilic      Diameter (cm) Depth (cm)    Findings     +-----------------+-------------+----------+--------------+  Prox upper arm        0.27         0.64                    +-----------------+-------------+----------+--------------+  Mid upper arm         0.20         0.80                     +-----------------+-------------+----------+--------------+  Dist upper arm        0.20         0.67      branching     +-----------------+-------------+----------+--------------+  Antecubital fossa                          not visualized  +-----------------+-------------+----------+--------------+  Prox forearm                               not visualized  +-----------------+-------------+----------+--------------+  Mid forearm                                not visualized  +-----------------+-------------+----------+--------------+  Distal forearm                             not visualized  +-----------------+-------------+----------+--------------+ *See table(s) above for measurements and observations.  Diagnosing physician: [name] MD Electronically signed by [name] MD on 09/11/2019 at 1:00:54 PM.    Final         Scheduled Meds:  bumetanide (BUMEX) IV  2 mg Intravenous TID   ferrous sulfate  325 mg Oral Q breakfast   heparin  5,000 Units Subcutaneous Q8H   insulin aspart  0-5 Units Subcutaneous QHS   insulin aspart  0-9 Units Subcutaneous TID WC   loperamide  4 mg Oral Once   nicotine  21 mg Transdermal Daily   Continuous Infusions:   LOS: 3 days    Time spent: 36 minutes.     Hosie Poisson, MD [facility] Pager [phone]   If 7PM-7AM, please contact night-coverage [email or web address] Password [email or web address] 09/11/2019, 1:53 PM

## 2019-09-11 NOTE — Progress Notes (Signed)
Upper extremity vein mapping has been completed.   Preliminary results in CV Proc.   Abram Sander 09/11/2019 12:58 PM

## 2019-09-11 NOTE — Progress Notes (Signed)
[facility]    NEPHROLOGY PROGRESS NOTE  SUBJECTIVE: Patient seen and examined.  Continues to have lower extremity edema.  Complained of minimal pain.  Denies chest pain, shortness of breath, nausea, vomiting, diarrhea or dysuria.  Wants to go home.  All other review of systems are negative.   OBJECTIVE:  Vitals:   09/11/19 0427 09/11/19 1000  BP: (!) 156/86 (!) 152/84  Pulse: 98 98  Resp: 18 14  Temp: 98.3 F (36.8 C) 98.6 F (37 C)  SpO2: 100% 99%    Intake/Output Summary (Last 24 hours) at 09/11/2019 1103 Last data filed at 09/11/2019 1000 Gross per 24 hour  Intake 702 ml  Output 100 ml  Net 602 ml      General:  AAOx3 NAD HEENT: MMM [place] AT anicteric sclera Neck:  No JVD, no adenopathy CV:  Heart RRR  Lungs:  L/S CTA bilaterally Abd:  abd SNT/ND with normal BS GU:  Bladder non-palpable Extremities: +3 bilateral lower extremity edema Skin:  No skin rash  MEDICATIONS:  . bumetanide (BUMEX) IV  2 mg Intravenous TID  . ferrous sulfate  325 mg Oral Q breakfast  . heparin  5,000 Units Subcutaneous Q8H  . insulin aspart  0-5 Units Subcutaneous QHS  . insulin aspart  0-9 Units Subcutaneous TID WC  . loperamide  4 mg Oral Once  . nicotine  21 mg Transdermal Daily       LABS:   CBC Latest Ref Rng & Units 09/11/2019 09/10/2019 09/08/2019  WBC 4.0 - 10.5 K/uL 8.1 6.8 -  Hemoglobin 13.0 - 17.0 g/dL 7.2(L) 7.6(L) 8.8(L)  Hematocrit 39.0 - 52.0 % 21.4(L) 22.2(L) 26.0(L)  Platelets 150 - 400 K/uL 182 185 -    CMP Latest Ref Rng & Units 09/11/2019 09/10/2019 09/09/2019  Glucose 70 - 99 mg/dL 218(H) 185(H) 182(H)  BUN 8 - 23 mg/dL 69(H) 64(H) 62(H)  Creatinine 0.61 - 1.24 mg/dL 7.07(H) 6.85(H) 6.97(H)  Sodium 135 - 145 mmol/L 141 139 138  Potassium 3.5 - 5.1 mmol/L 3.7 3.3(L) 3.6  Chloride 98 - 111 mmol/L 107 105 101  CO2 22 - 32 mmol/L 24 24 23   Calcium 8.9 - 10.3 mg/dL 7.5(L) 7.4(L) 7.3(L)  Total Protein 6.5 - 8.1 g/dL - - -  Total Bilirubin 0.3 - 1.2  mg/dL - - -  Alkaline Phos 38 - 126 U/L - - -  AST 15 - 41 U/L - - -  ALT 0 - 44 U/L - - -    Lab Results  Component Value Date   CALCIUM 7.5 (L) 09/11/2019   CAION 0.98 (L) 09/08/2019   PHOS 6.8 (H) 09/08/2019       Component Value Date/Time   COLORURINE YELLOW 09/09/2019 0343   APPEARANCEUR CLEAR 09/09/2019 0343   LABSPEC 1.009 09/09/2019 0343   PHURINE 5.0 09/09/2019 0343   GLUCOSEU 50 (A) 09/09/2019 0343   HGBUR MODERATE (A) 09/09/2019 0343   BILIRUBINUR NEGATIVE 09/09/2019 0343   KETONESUR NEGATIVE 09/09/2019 0343   PROTEINUR >=300 (A) 09/09/2019 0343   NITRITE NEGATIVE 09/09/2019 0343   LEUKOCYTESUR NEGATIVE 09/09/2019 0343      Component Value Date/Time   TCO2 24 09/08/2019 2140       Component Value Date/Time   IRON 41 (L) 09/10/2019 1044   TIBC 153 (L) 09/10/2019 1044   FERRITIN 128 09/10/2019 1044   IRONPCTSAT 27 09/10/2019 1044       ASSESSMENT/PLAN:     72M with progressive renal failure and  nephrotic proteinuria.  Also with some degree of hematuria.  This could be diabetic nephropathy, in fact it probably is.  However, given the rapid progression, renal biopsy performed on 09/10/2019.  1.  Acute renal failure.  Continues to have rapid progression of underlying renal disease.  Biopsy pending.  Serologies negative with the exception of a low C4.  Creatinine stable around 7.  No urgent indication for dialysis.  Will ask vascular to place a dialysis access.  Venous mapping ordered.  2.  Hypervolemia.  Increase bumetanide to 2 mg thrice daily. Can transition to p.o. upon discharge.  3.  Hypertension.  Overall stable.  4.  Diabetes mellitus.  Continue tight diabetes control.  Suspect this is the etiology of his worsening renal function.  Suspect a collapsing variant of diabetic nephropathy.  5.  Anemia of chronic kidney disease.  We will add oral iron.  Monitor hemoglobin.   [place], DO, [place]

## 2019-09-11 NOTE — Consult Note (Signed)
Hospital Consult    Reason for Consult:  To establish hd access Referring Physician:  Dr. Karleen Hampshire MRN #:  [id number]  History of Present Illness: This is a 63 y.o. male with progressive chronic kidney disease now admitted with worsening renal function and progressive bilateral lower extremity edema that he says has been present for 2 months.  He states that biopsy is performed he is unsure of the cause of his renal failure.  He does have diabetes.  He is right-hand dominant.  He has never had upper extremity chest breast surgery or pacer defibrillator or port surgery in the past.  He does not take blood thinners.  Previously took aspirin does not now.  Past Medical History:  Diagnosis Date  . Asthma   . Cancer ([place])   . Cataract   . CKD (chronic kidney disease), stage III ([place])   . Diabetes mellitus   . Glaucoma     Past Surgical History:  Procedure Laterality Date  . COLONOSCOPY    . UPPER GASTROINTESTINAL ENDOSCOPY  02/2019   Dr Benson [place]      No Known Allergies  Prior to Admission medications   Medication Sig Start Date End Date Taking? Authorizing Provider  aspirin EC 81 MG tablet Take 81 mg by mouth as needed for mild pain.   Yes [provider]  bumetanide (BUMEX) 2 MG tablet Take 4 mg by mouth 2 (two) times daily.   Yes [provider]  glipiZIDE (GLUCOTROL XL) 10 MG 24 hr tablet Take 10 mg by mouth daily with breakfast.   Yes [provider]  omeprazole (PRILOSEC) 40 MG capsule Take 40 mg by mouth daily.   Yes [provider]  Vitamin D, Ergocalciferol, (DRISDOL) 1.25 MG (50000 UT) CAPS capsule Take 1 capsule by mouth once a week. Sundays 02/17/19  Yes [provider]  amoxicillin-clavulanate (AUGMENTIN) 875-125 MG tablet Take 1 tablet by mouth 2 (two) times daily. One po bid x 7 days Patient not taking: Reported on 09/08/2019 10/23/18   Jacqlyn Larsen, PA-C  doxycycline (VIBRAMYCIN) 100 MG capsule Take 1 capsule (100 mg total) by  mouth 2 (two) times daily. One po bid x 7 days Patient not taking: Reported on 09/08/2019 10/23/18   Jacqlyn Larsen, PA-C  Lancet Devices (AUTO-LANCETS) MISC 100 Units by Does not apply route every morning. Patient not taking: Reported on 09/08/2019 02/24/12   Robyn Haber, MD  losartan (COZAAR) 50 MG tablet TAKE 1 TABLET BY MOUTH EVERY DAY Patient not taking: Reported on 09/08/2019 11/14/13   Theda Sers, PA-C  sitaGLIPtin (JANUVIA) 50 MG tablet Take 1 tablet (50 mg total) by mouth daily. Patient not taking: Reported on 09/08/2019 11/10/15   Copland, Gay Filler, MD  traMADol (ULTRAM) 50 MG tablet Take 1 tablet (50 mg total) by mouth every 8 (eight) hours as needed for pain. Patient not taking: Reported on 09/08/2019 09/07/13   Copland, Gay Filler, MD    Social History   Socioeconomic History  . Marital status: Married    Spouse name: Not on file  . Number of children: Not on file  . Years of education: Not on file  . Highest education level: Not on file  Occupational History  . Occupation: Glass blower/designer  . Financial resource strain: Not on file  . Food insecurity    Worry: Not on file    Inability: Not on file  . Transportation needs    Medical: Not on file  Non-medical: Not on file  Tobacco Use  . Smoking status: Current Every Day Smoker    Packs/day: 0.80    Types: Cigarettes  . Smokeless tobacco: Never Used  Substance and Sexual Activity  . Alcohol use: No    Alcohol/week: 0.0 standard drinks  . Drug use: No  . Sexual activity: Not on file  Lifestyle  . Physical activity    Days per week: Not on file    Minutes per session: Not on file  . Stress: Not on file  Relationships  . Social Herbalist on phone: Not on file    Gets together: Not on file    Attends religious service: Not on file    Active member of club or organization: Not on file    Attends meetings of clubs or organizations: Not on file    Relationship status: Not on file  . Intimate  partner violence    Fear of current or ex partner: Not on file    Emotionally abused: Not on file    Physically abused: Not on file    Forced sexual activity: Not on file  Other Topics Concern  . Not on file  Social History Narrative   Married.      Family History  Problem Relation Age of Onset  . Cancer Father     REVIEW OF SYSTEMS (negative unless checked):   Cardiac:  []  Chest pain or chest pressure? []  Shortness of breath upon activity? []  Shortness of breath when lying flat? []  Irregular heart rhythm?  Vascular:  []  Pain in calf, thigh, or hip brought on by walking? []  Pain in feet at night that wakes you up from your sleep? []  Blood clot in your veins? [x]  Leg swelling?  Pulmonary:  []  Oxygen at home? []  Productive cough? []  Wheezing?  Neurologic:  []  Sudden weakness in arms or legs? []  Sudden numbness in arms or legs? []  Sudden onset of difficult speaking or slurred speech? []  Temporary loss of vision in one eye? []  Problems with dizziness?  Gastrointestinal:  []  Blood in stool? []  Vomited blood?  Genitourinary:  []  Burning when urinating? []  Blood in urine?  Psychiatric:  []  Major depression  Hematologic:  []  Bleeding problems? []  Problems with blood clotting?  Dermatologic:  []  Rashes or ulcers?  Constitutional:  []  Fever or chills?  Ear/Nose/Throat:  []  Change in hearing? []  Nose bleeds? []  Sore throat?  Musculoskeletal:  []  Back pain? []  Joint pain? []  Muscle pain?    Physical Examination  Vitals:   09/11/19 1000 09/11/19 1237  BP: (!) 152/84 (!) 145/75  Pulse: 98 93  Resp: 14 16  Temp: 98.6 F (37 C) 98.3 F (36.8 C)  SpO2: 99% 98%   Body mass index is 22.7 kg/m.  General:  nad Pulmonary: normal non-labored breathing Cardiac: palpable radial pulses bilaterally Abdomen: soft, NT/ND, no masses Extremities: Bilateral lower extremity 2+ edema Neurologic: A&O X 3; Appropriate Affect ; SENSATION: normal; MOTOR  FUNCTION:  moving all extremities equally. Speech is fluent/normal   CBC    Component Value Date/Time   WBC 8.1 09/11/2019 0340   RBC 2.39 (L) 09/11/2019 0340   HGB 7.2 (L) 09/11/2019 0340   HCT 21.4 (L) 09/11/2019 0340   PLT 182 09/11/2019 0340   MCV 89.5 09/11/2019 0340   MCV 93.0 09/07/2013 0855   MCH 30.1 09/11/2019 0340   MCHC 33.6 09/11/2019 0340   RDW 13.8 09/11/2019 0340   LYMPHSABS 1.3 09/10/2019  0340   MONOABS 0.5 09/10/2019 0340   EOSABS 1.3 (H) 09/10/2019 0340   BASOSABS 0.0 09/10/2019 0340    BMET    Component Value Date/Time   NA 141 09/11/2019 0340   K 3.7 09/11/2019 0340   CL 107 09/11/2019 0340   CO2 24 09/11/2019 0340   GLUCOSE 218 (H) 09/11/2019 0340   BUN 69 (H) 09/11/2019 0340   CREATININE 7.07 (H) 09/11/2019 0340   CREATININE 2.22 (H) 09/07/2013 0845   CALCIUM 7.5 (L) 09/11/2019 0340   GFRNONAA 8 (L) 09/11/2019 0340   GFRAA 9 (L) 09/11/2019 0340    COAGS: Lab Results  Component Value Date   INR 1.1 09/10/2019   INR 1.1 09/09/2019     Non-Invasive Vascular Imaging:   Vein mapping personally interpreted with cephalic vein on the right measuring greatest however 0.34 cm at the antecubital much smaller above that.  The right basilic vein measures 1.19 cm at the antecubital up to 0.81 in the upper arm.  On the left arm cephalic vein is diminutive greatest diameter 0.22 but down as low as 0.12 in the upper arm.  Left basilic vein not visualized at the antecubitum 0.2 cm in the upper arm and 0.27 up at the proximal upper arm.    ASSESSMENT/PLAN: This is a 63 y.o. male needs tdc and permanent access for acute renal failure.  He is right-hand dominant would likely benefit from right arm access.  Prior to restricting this arm I will make sure he is okay with proceeding.  Likely would need tunneled dialysis catheter.  This could all be performed Tuesday or Wednesday of the upcoming week.  I discussed the procedures with the patient including the expected  outcomes and timing and he demonstrates good understanding.  Halina Asano C. Donzetta Matters, MD [facility] Office: [phone] Pager: [phone]

## 2019-09-12 LAB — BASIC METABOLIC PANEL
Anion gap: 12 (ref 5–15)
BUN: 73 mg/dL — ABNORMAL HIGH (ref 8–23)
CO2: 22 mmol/L (ref 22–32)
Calcium: 7.6 mg/dL — ABNORMAL LOW (ref 8.9–10.3)
Chloride: 105 mmol/L (ref 98–111)
Creatinine, Ser: 6.92 mg/dL — ABNORMAL HIGH (ref 0.61–1.24)
GFR calc Af Amer: 9 mL/min — ABNORMAL LOW (ref >60)
GFR calc non Af Amer: 8 mL/min — ABNORMAL LOW (ref >60)
Glucose, Bld: 217 mg/dL — ABNORMAL HIGH (ref 70–99)
Potassium: 4.1 mmol/L (ref 3.5–5.1)
Sodium: 139 mmol/L (ref 135–145)

## 2019-09-12 LAB — CBC
HCT: 22 % — ABNORMAL LOW (ref 39.0–52.0)
Hemoglobin: 7.4 g/dL — ABNORMAL LOW (ref 13.0–17.0)
MCH: 29.8 pg (ref 26.0–34.0)
MCHC: 33.6 g/dL (ref 30.0–36.0)
MCV: 88.7 fL (ref 80.0–100.0)
Platelets: 194 10*3/uL (ref 150–400)
RBC: 2.48 MIL/uL — ABNORMAL LOW (ref 4.22–5.81)
RDW: 13.5 % (ref 11.5–15.5)
WBC: 7.3 10*3/uL (ref 4.0–10.5)
nRBC: 0 % (ref 0.0–0.2)

## 2019-09-12 LAB — GLUCOSE, CAPILLARY
Glucose-Capillary: 180 mg/dL — ABNORMAL HIGH (ref 70–99)
Glucose-Capillary: 192 mg/dL — ABNORMAL HIGH (ref 70–99)
Glucose-Capillary: 229 mg/dL — ABNORMAL HIGH (ref 70–99)
Glucose-Capillary: 144 mg/dL — ABNORMAL HIGH (ref 70–99)

## 2019-09-12 MED ORDER — DEXTROSE 5 % IV SOLN
3.0000 mg | Freq: Four times a day (QID) | INTRAVENOUS | Status: DC
  Administered 2019-09-12 – 2019-09-14 (×8): 3 mg via INTRAVENOUS
  Filled 2019-09-12 (×2): qty 10
  Filled 2019-09-12 (×4): qty 12
  Filled 2019-09-12: qty 10
  Filled 2019-09-12 (×3): qty 12
  Filled 2019-09-12 (×2): qty 10

## 2019-09-12 MED ORDER — METOLAZONE 5 MG PO TABS
5.0000 mg | ORAL_TABLET | Freq: Every day | ORAL | Status: DC
  Administered 2019-09-12 – 2019-09-13 (×2): 5 mg via ORAL
  Filled 2019-09-12 (×3): qty 1

## 2019-09-12 MED ORDER — DARBEPOETIN ALFA 40 MCG/0.4ML IJ SOSY
40.0000 ug | PREFILLED_SYRINGE | Freq: Once | INTRAMUSCULAR | Status: AC
  Administered 2019-09-12: 40 ug via SUBCUTANEOUS
  Filled 2019-09-12: qty 0.4

## 2019-09-12 MED ORDER — INSULIN ASPART 100 UNIT/ML ~~LOC~~ SOLN
0.0000 [IU] | Freq: Three times a day (TID) | SUBCUTANEOUS | Status: DC
  Administered 2019-09-12: 18:00:00 5 [IU] via SUBCUTANEOUS
  Administered 2019-09-13: 11:00:00 8 [IU] via SUBCUTANEOUS

## 2019-09-12 NOTE — Progress Notes (Signed)
[facility]    NEPHROLOGY PROGRESS NOTE  SUBJECTIVE: Patient seen and examined.  Continues to have lower extremity edema.  Complained of minimal pain.  Denies chest pain, shortness of breath, nausea, vomiting, diarrhea or dysuria.  All other review of systems are negative.   OBJECTIVE:  Vitals:   09/11/19 2012 09/12/19 0620  BP: (!) 146/69 (!) 161/80  Pulse: 98 88  Resp: 15 17  Temp: 98.5 F (36.9 C) 98.4 F (36.9 C)  SpO2: 99% 98%    Intake/Output Summary (Last 24 hours) at 09/12/2019 1306 Last data filed at 09/11/2019 2300 Gross per 24 hour  Intake 572 ml  Output 85 ml  Net 487 ml      General:  AAOx3 NAD HEENT: MMM [place] AT anicteric sclera Neck:  No JVD, no adenopathy CV:  Heart RRR  Lungs:  L/S CTA bilaterally Abd:  abd SNT/ND with normal BS GU:  Bladder non-palpable Extremities: +3 bilateral lower extremity edema Skin:  No skin rash  MEDICATIONS:  . bumetanide (BUMEX) IV  2 mg Intravenous TID  . ferrous sulfate  325 mg Oral Q breakfast  . heparin  5,000 Units Subcutaneous Q8H  . insulin aspart  0-5 Units Subcutaneous QHS  . insulin aspart  0-9 Units Subcutaneous TID WC  . loperamide  4 mg Oral Once  . nicotine  21 mg Transdermal Daily       LABS:   CBC Latest Ref Rng & Units 09/12/2019 09/11/2019 09/10/2019  WBC 4.0 - 10.5 K/uL 7.3 8.1 6.8  Hemoglobin 13.0 - 17.0 g/dL 7.4(L) 7.2(L) 7.6(L)  Hematocrit 39.0 - 52.0 % 22.0(L) 21.4(L) 22.2(L)  Platelets 150 - 400 K/uL 194 182 185    CMP Latest Ref Rng & Units 09/12/2019 09/11/2019 09/10/2019  Glucose 70 - 99 mg/dL 217(H) 218(H) 185(H)  BUN 8 - 23 mg/dL 73(H) 69(H) 64(H)  Creatinine 0.61 - 1.24 mg/dL 6.92(H) 7.07(H) 6.85(H)  Sodium 135 - 145 mmol/L 139 141 139  Potassium 3.5 - 5.1 mmol/L 4.1 3.7 3.3(L)  Chloride 98 - 111 mmol/L 105 107 105  CO2 22 - 32 mmol/L 22 24 24   Calcium 8.9 - 10.3 mg/dL 7.6(L) 7.5(L) 7.4(L)  Total Protein 6.5 - 8.1 g/dL - - -  Total Bilirubin 0.3 - 1.2 mg/dL - - -   Alkaline Phos 38 - 126 U/L - - -  AST 15 - 41 U/L - - -  ALT 0 - 44 U/L - - -    Lab Results  Component Value Date   CALCIUM 7.6 (L) 09/12/2019   CAION 0.98 (L) 09/08/2019   PHOS 6.8 (H) 09/08/2019       Component Value Date/Time   COLORURINE YELLOW 09/09/2019 0343   APPEARANCEUR CLEAR 09/09/2019 0343   LABSPEC 1.009 09/09/2019 0343   PHURINE 5.0 09/09/2019 0343   GLUCOSEU 50 (A) 09/09/2019 0343   HGBUR MODERATE (A) 09/09/2019 0343   BILIRUBINUR NEGATIVE 09/09/2019 0343   KETONESUR NEGATIVE 09/09/2019 0343   PROTEINUR >=300 (A) 09/09/2019 0343   NITRITE NEGATIVE 09/09/2019 0343   LEUKOCYTESUR NEGATIVE 09/09/2019 0343      Component Value Date/Time   TCO2 24 09/08/2019 2140       Component Value Date/Time   IRON 41 (L) 09/10/2019 1044   TIBC 153 (L) 09/10/2019 1044   FERRITIN 128 09/10/2019 1044   IRONPCTSAT 27 09/10/2019 1044       ASSESSMENT/PLAN:     78M with progressive renal failure and nephrotic proteinuria.  Also with  some degree of hematuria.  This could be diabetic nephropathy, in fact it probably is.  However, given the rapid progression, renal biopsy performed on 09/10/2019.  1.  Acute renal failure.  Continues to have rapid progression of underlying renal disease.  Biopsy pending.  Serologies negative with the exception of a low C4.  Creatinine stable around 7.  No urgent indication for dialysis.  Will attempt to diurese further given persistent significant edema.  Has had minimal response to diuretics so far.  For AV fistula placement on Tuesday.  Would hold off on dialysis catheter placement as he has no clear uremic symptoms or urgent indication for starting dialysis.  Hopefully we can start dialysis with the fistula once it is matured.  We will asked dialysis social worker to see patient.  2.  Hypervolemia.  Increase bumetanide to 3 mg q6hours and add metolazone. Can transition to p.o. upon discharge.  3.  Hypertension.  Overall stable.  4.  Diabetes  mellitus.  Continue tight diabetes control.  Suspect this is the etiology of his worsening renal function.  Suspect a collapsing variant of diabetic nephropathy.  5.  Anemia of chronic kidney disease.  Continue oral iron and aranesp.  Monitor hemoglobin.   [place], DO, [place]

## 2019-09-12 NOTE — Progress Notes (Signed)
PROGRESS NOTE    Duane Hall  OIN:[id number] DOB: [birth year] DOA: 09/08/2019 PCP: Heywood Bene, PA-C   Brief Narrative:   63 year old gentleman with prior history of type II by diabetes, asthma, stage III CKD was sent to ED for worsening renal parameters.  On admission patient also reports progressive worsening of the lower extremity swelling and reports intolerance to diuretics (gives him diarrhea). On arrival to ED his creatinine was 6.8, whereas his baseline creatinine is around 2.  He continues to be fluid overloaded with bilateral lower extremity 3+ edema.  Nephrology was consulted.  He underwent kidney biopsy on 09/10/2019 with results pending. Meanwhile his creatinine continues to worsen to 7 today.  Vein mapping ordered and vascular surgery consulted for av  fistula placement which will be scheduled to be done on Tuesday. Pt seen and examined today. No signs of Uremia. Electrolytes wnl and no urgent indication of HD as per nephrology.    Assessment & Plan:   Principal Problem:   Volume overload Active Problems:   Type II diabetes mellitus (HCC)   Asthma   CKD (chronic kidney disease)   Anemia of chronic disease   Hypokalemia  Acute on stage III CKD Progression of renal disease probably secondary to worsening of diabetic nephropathy. Renal ultrasound showed chronic medical renal disease without any obstruction.   Urine analysis is negative for infection. Nephrology on board and IR consulted and he underwent renal biopsy on 09/10/2019 and results are pending. Further work-up negative with the exception of low C4.  No urgent indication for dialysis, as he does not appear to be uremic and his potassium is 4.1 Vein mapping done  by nephrology and vascular consulted for HD access. Plan for AV fistula on Tuesday by vascular surgery.    Fluid overload/hypervolemia Bumetanide increased to 2 mg 3 times daily, without much diuresis and he continues to be fluid overloaded.  Metolazone added b y nephrology to augment diuresis.    Continue with strict intake and output and monitor urine output   Hypertension Blood pressure parameters systolic ranging from 709-628 and diastolic from a 36O to 29U.  Metolazone added. Monitor.    Type 2 diabetes mellitus. CBG (last 3)  Recent Labs    09/11/19 2034 09/12/19 0741 09/12/19 1345  GLUCAP 161* 180* 192*   Not well controlled with hyperglycemia. Hemo-globin A1c 6.5 Increase sliding scale to moderate SSI.   Anemia of chronic disease/anemia secondary to end-stage renal disease iron deficiency anemia Low iron levels, iron supplementation added Stool for occult blood ordered and pending  Hemoglobin around 7.4, transfuse to keep hemoglobin greater than 7.  Asthma No wheezing heard on exam today   hypokalemia Replaced  Constipation:  No BM since 2 days, will add senna or colace.   DVT prophylaxis: Subcu heparin Code Status: Full code Family Communication:  Pending further work up.  Disposition Plan: pending vascular surgery work up.    Consultants:   Nephrology  Vascular surgery.   IR  Procedures: vein mapping   Antimicrobials: none.   Subjective: No chest pain or sob, no nausea or vomiting.   Objective: Vitals:   09/11/19 1535 09/11/19 1600 09/11/19 2012 09/12/19 0620  BP: (!) 160/62 (!) 160/66 (!) 146/69 (!) 161/80  Pulse:   98 88  Resp:   15 17  Temp:   98.5 F (36.9 C) 98.4 F (36.9 C)  TempSrc:   Oral Oral  SpO2:   99% 98%  Weight:  Height:        Intake/Output Summary (Last 24 hours) at 09/12/2019 1357 Last data filed at 09/11/2019 2300 Gross per 24 hour  Intake 572 ml  Output 85 ml  Net 487 ml   Filed Weights   09/10/19 1017 09/11/19 1000 09/11/19 1400  Weight: 72.1 kg 69.7 kg 69.7 kg    Examination:  General exam: Resting comfortably in bed not in any kind of distress Respiratory system: Air entry fair, no wheezing or rhonchi Cardiovascular system: S1-S2  heard, regular rate rhythm, 3+ leg edema. Gastrointestinal system: Abdomen is soft, mildly generalized  tenderness, nondistended, bowel sounds are good Central nervous system: Alert and oriented, grossly nonfocal. Extremities 3+ leg edema present Skin: No rashes seen Psychiatry: Mood appropriate    Data Reviewed: I have personally reviewed following labs and imaging studies  CBC: Recent Labs  Lab 09/08/19 1710 09/08/19 2140 09/10/19 0340 09/11/19 0340 09/12/19 0243  WBC 9.5  --  6.8 8.1 7.3  NEUTROABS 5.8  --  3.7  --   --   HGB 9.2* 8.8* 7.6* 7.2* 7.4*  HCT 26.3* 26.0* 22.2* 21.4* 22.0*  MCV 87.1  --  88.1 89.5 88.7  PLT 243  --  185 182 169   Basic Metabolic Panel: Recent Labs  Lab 09/08/19 2130 09/08/19 2140 09/09/19 1049 09/10/19 0340 09/11/19 0340 09/12/19 0243  NA 138 138 138 139 141 139  K 2.9* 3.2* 3.6 3.3* 3.7 4.1  CL 100 100 101 105 107 105  CO2 23  --  23 24 24 22   GLUCOSE 169* 197* 182* 185* 218* 217*  BUN 58* 52* 62* 64* 69* 73*  CREATININE 6.86* 7.10* 6.97* 6.85* 7.07* 6.92*  CALCIUM 7.6*  --  7.3* 7.4* 7.5* 7.6*  MG 1.7  --   --   --   --   --   PHOS 6.8*  --   --   --   --   --    GFR: Estimated Creatinine Clearance: 11.1 mL/min (A) (by C-G formula based on SCr of 6.92 mg/dL (H)). Liver Function Tests: Recent Labs  Lab 09/08/19 1710 09/08/19 2130  AST 12*  --   ALT 11  --   ALKPHOS 106  --   BILITOT 0.2*  --   PROT 4.9*  --   ALBUMIN 2.4* 2.3*   No results for input(s): LIPASE, AMYLASE in the last 168 hours. No results for input(s): AMMONIA in the last 168 hours. Coagulation Profile: Recent Labs  Lab 09/09/19 1428 09/10/19 0340  INR 1.1 1.1   Cardiac Enzymes: No results for input(s): CKTOTAL, CKMB, CKMBINDEX, TROPONINI in the last 168 hours. BNP (last 3 results) No results for input(s): PROBNP in the last 8760 hours. HbA1C: No results for input(s): HGBA1C in the last 72 hours. CBG: Recent Labs  Lab 09/11/19 1148 09/11/19  1613 09/11/19 2034 09/12/19 0741 09/12/19 1345  GLUCAP 146* 259* 161* 180* 192*   Lipid Profile: No results for input(s): CHOL, HDL, LDLCALC, TRIG, CHOLHDL, LDLDIRECT in the last 72 hours. Thyroid Function Tests: No results for input(s): TSH, T4TOTAL, FREET4, T3FREE, THYROIDAB in the last 72 hours. Anemia Panel: Recent Labs    09/10/19 1044  VITAMINB12 229  FOLATE 9.5  FERRITIN 128  TIBC 153*  IRON 41*  RETICCTPCT 1.4   Sepsis Labs: No results for input(s): PROCALCITON, LATICACIDVEN in the last 168 hours.  Recent Results (from the past 240 hour(s))  SARS Coronavirus 2 [facility] order, Performed in [facility]  hospital lab) Nasopharyngeal Nasopharyngeal Swab     Status: None   Collection Time: 09/08/19  9:33 PM   Specimen: Nasopharyngeal Swab  Result Value Ref Range Status   SARS Coronavirus 2 NEGATIVE NEGATIVE Final    Comment: (NOTE) If result is NEGATIVE SARS-CoV-2 target nucleic acids are NOT DETECTED. The SARS-CoV-2 RNA is generally detectable in upper and lower  respiratory specimens during the acute phase of infection. The lowest  concentration of SARS-CoV-2 viral copies this assay can detect is 250  copies / mL. A negative result does not preclude SARS-CoV-2 infection  and should not be used as the sole basis for treatment or other  patient management decisions.  A negative result may occur with  improper specimen collection / handling, submission of specimen other  than nasopharyngeal swab, presence of viral mutation(s) within the  areas targeted by this assay, and inadequate number of viral copies  (<250 copies / mL). A negative result must be combined with clinical  observations, patient history, and epidemiological information. If result is POSITIVE SARS-CoV-2 target nucleic acids are DETECTED. The SARS-CoV-2 RNA is generally detectable in upper and lower  respiratory specimens dur ing the acute phase of infection.  Positive  results are indicative of  active infection with SARS-CoV-2.  Clinical  correlation with patient history and other diagnostic information is  necessary to determine patient infection status.  Positive results do  not rule out bacterial infection or co-infection with other viruses. If result is PRESUMPTIVE POSTIVE SARS-CoV-2 nucleic acids MAY BE PRESENT.   A presumptive positive result was obtained on the submitted specimen  and confirmed on repeat testing.  While 2019 novel coronavirus  (SARS-CoV-2) nucleic acids may be present in the submitted sample  additional confirmatory testing may be necessary for epidemiological  and / or clinical management purposes  to differentiate between  SARS-CoV-2 and other Sarbecovirus currently known to infect humans.  If clinically indicated additional testing with an alternate test  methodology [phone]) is advised. The SARS-CoV-2 RNA is generally  detectable in upper and lower respiratory sp ecimens during the acute  phase of infection. The expected result is Negative. Fact Sheet for Patients:  StrictlyIdeas.no Fact Sheet for Healthcare Providers: BankingDealers.co.za This test is not yet approved or cleared by the [place] FDA and has been authorized for detection and/or diagnosis of SARS-CoV-2 by FDA under an Emergency Use Authorization (EUA).  This EUA will remain in effect (meaning this test can be used) for the duration of the COVID-19 declaration under Section 564(b)(1) of the Act, 21 U.S.C. section 360bbb-3(b)(1), unless the authorization is terminated or revoked sooner. Performed at [facility], [place] [address] [place], [place] [address]          Radiology Studies: Vas [place] Upper Ext Vein Mapping (pre-op Avf)  Result Date: 09/11/2019 UPPER EXTREMITY VEIN MAPPING  Indications: Pre-access. Comparison Study: NO PRIOR Performing Technologist: Abram Sander RVS  Examination Guidelines: A complete evaluation  includes B-mode imaging, spectral Doppler, color Doppler, and power Doppler as needed of all accessible portions of each vessel. Bilateral testing is considered an integral part of a complete examination. Limited examinations for reoccurring indications may be performed as noted. +-----------------+-------------+----------+------------------------+ Right Cephalic   Diameter (cm)Depth (cm)        Findings         +-----------------+-------------+----------+------------------------+ Shoulder             0.22        0.58                            +-----------------+-------------+----------+------------------------+  Prox upper arm       0.19        0.49                            +-----------------+-------------+----------+------------------------+ Mid upper arm        0.21        0.32                            +-----------------+-------------+----------+------------------------+ Dist upper arm       0.32        0.28                            +-----------------+-------------+----------+------------------------+ Antecubital fossa    0.34        0.28                            +-----------------+-------------+----------+------------------------+ Prox forearm                            not visualized due to iv +-----------------+-------------+----------+------------------------+ Mid forearm          0.22        0.29                            +-----------------+-------------+----------+------------------------+ Dist forearm         0.15        0.23                            +-----------------+-------------+----------+------------------------+ +-----------------+-------------+----------+--------------+ Right Basilic    Diameter (cm)Depth (cm)   Findings    +-----------------+-------------+----------+--------------+ Prox upper arm       0.81        0.72                  +-----------------+-------------+----------+--------------+ Mid upper arm        0.51         0.86                  +-----------------+-------------+----------+--------------+ Dist upper arm       0.45        0.46                  +-----------------+-------------+----------+--------------+ Antecubital fossa    0.48        0.56                  +-----------------+-------------+----------+--------------+ Prox forearm         0.17        0.34     branching    +-----------------+-------------+----------+--------------+ Mid forearm          0.16        0.16     branching    +-----------------+-------------+----------+--------------+ Distal forearm                          not visualized +-----------------+-------------+----------+--------------+ Elbow                                   not visualized +-----------------+-------------+----------+--------------+ +-----------------+-------------+----------+--------+ Left Cephalic  Diameter (cm)Depth (cm)Findings +-----------------+-------------+----------+--------+ Shoulder             0.17        0.54            +-----------------+-------------+----------+--------+ Prox upper arm       0.19        0.39            +-----------------+-------------+----------+--------+ Mid upper arm        0.12        0.31            +-----------------+-------------+----------+--------+ Dist upper arm       0.16        0.25            +-----------------+-------------+----------+--------+ Antecubital fossa    0.22        0.35            +-----------------+-------------+----------+--------+ Prox forearm         0.17        0.49            +-----------------+-------------+----------+--------+ Mid forearm          0.13        0.22            +-----------------+-------------+----------+--------+ Dist forearm         0.11        0.34            +-----------------+-------------+----------+--------+ +-----------------+-------------+----------+--------------+ Left Basilic     Diameter (cm)Depth (cm)   Findings     +-----------------+-------------+----------+--------------+ Prox upper arm       0.27        0.64                  +-----------------+-------------+----------+--------------+ Mid upper arm        0.20        0.80                  +-----------------+-------------+----------+--------------+ Dist upper arm       0.20        0.67     branching    +-----------------+-------------+----------+--------------+ Antecubital fossa                       not visualized +-----------------+-------------+----------+--------------+ Prox forearm                            not visualized +-----------------+-------------+----------+--------------+ Mid forearm                             not visualized +-----------------+-------------+----------+--------------+ Distal forearm                          not visualized +-----------------+-------------+----------+--------------+ *See table(s) above for measurements and observations.  Diagnosing physician: [name] MD Electronically signed by [name] MD on 09/11/2019 at 1:00:54 PM.    Final         Scheduled Meds: . darbepoetin (ARANESP) injection - NON-DIALYSIS  40 mcg Subcutaneous Once  . ferrous sulfate  325 mg Oral Q breakfast  . heparin  5,000 Units Subcutaneous Q8H  . insulin aspart  0-5 Units Subcutaneous QHS  . insulin aspart  0-9 Units Subcutaneous TID WC  . loperamide  4 mg Oral Once  . metolazone  5 mg Oral Daily  . nicotine  21 mg Transdermal Daily  Continuous Infusions: . bumetanide (BUMEX) IV       LOS: 4 days    Time spent: 28 minutes.     Hosie Poisson, MD [facility] Pager [phone]   If 7PM-7AM, please contact night-coverage [email or web address] Password [facility] 09/12/2019, 1:57 PM

## 2019-09-12 NOTE — Progress Notes (Signed)
  Progress Note    09/12/2019 11:03 AM * No surgery found *  Subjective: No overnight issues  Vitals:   09/11/19 2012 09/12/19 0620  BP: (!) 146/69 (!) 161/80  Pulse: 98 88  Resp: 15 17  Temp: 98.5 F (36.9 C) 98.4 F (36.9 C)  SpO2: 99% 98%    Physical Exam: Awake alert oriented Palpable bilateral radial pulses Stable pitting edema bilateral lower extremities  CBC    Component Value Date/Time   WBC 7.3 09/12/2019 0243   RBC 2.48 (L) 09/12/2019 0243   HGB 7.4 (L) 09/12/2019 0243   HCT 22.0 (L) 09/12/2019 0243   PLT 194 09/12/2019 0243   MCV 88.7 09/12/2019 0243   MCV 93.0 09/07/2013 0855   MCH 29.8 09/12/2019 0243   MCHC 33.6 09/12/2019 0243   RDW 13.5 09/12/2019 0243   LYMPHSABS 1.3 09/10/2019 0340   MONOABS 0.5 09/10/2019 0340   EOSABS 1.3 (H) 09/10/2019 0340   BASOSABS 0.0 09/10/2019 0340    BMET    Component Value Date/Time   NA 139 09/12/2019 0243   K 4.1 09/12/2019 0243   CL 105 09/12/2019 0243   CO2 22 09/12/2019 0243   GLUCOSE 217 (H) 09/12/2019 0243   BUN 73 (H) 09/12/2019 0243   CREATININE 6.92 (H) 09/12/2019 0243   CREATININE 2.22 (H) 09/07/2013 0845   CALCIUM 7.6 (L) 09/12/2019 0243   GFRNONAA 8 (L) 09/12/2019 0243   GFRAA 9 (L) 09/12/2019 0243    INR    Component Value Date/Time   INR 1.1 09/10/2019 0340     Intake/Output Summary (Last 24 hours) at 09/12/2019 1103 Last data filed at 09/11/2019 2300 Gross per 24 hour  Intake 572 ml  Output 85 ml  Net 487 ml     Assessment:  63 y.o. male is here with fluid overload now will require dialysis  Plan: OR on Tuesday for right arm AV access and likely tunneled dialysis catheter to be discussed with nephrology prior. Please restrict right arm   Pierina Schuknecht C. Donzetta Matters, MD [facility] Office: [phone] Pager: [phone]  09/12/2019 11:03 AM

## 2019-09-13 ENCOUNTER — Other Ambulatory Visit: Payer: Self-pay | Admitting: Student

## 2019-09-13 LAB — CBC
HCT: 20.5 % — ABNORMAL LOW (ref 39.0–52.0)
Hemoglobin: 7 g/dL — ABNORMAL LOW (ref 13.0–17.0)
MCH: 30.3 pg (ref 26.0–34.0)
MCHC: 34.1 g/dL (ref 30.0–36.0)
MCV: 88.7 fL (ref 80.0–100.0)
Platelets: 193 10*3/uL (ref 150–400)
RBC: 2.31 MIL/uL — ABNORMAL LOW (ref 4.22–5.81)
RDW: 13.7 % (ref 11.5–15.5)
WBC: 6.3 10*3/uL (ref 4.0–10.5)
nRBC: 0 % (ref 0.0–0.2)

## 2019-09-13 LAB — BASIC METABOLIC PANEL
Anion gap: 12 (ref 5–15)
BUN: 78 mg/dL — ABNORMAL HIGH (ref 8–23)
CO2: 22 mmol/L (ref 22–32)
Calcium: 7.6 mg/dL — ABNORMAL LOW (ref 8.9–10.3)
Chloride: 103 mmol/L (ref 98–111)
Creatinine, Ser: 7.03 mg/dL — ABNORMAL HIGH (ref 0.61–1.24)
GFR calc Af Amer: 9 mL/min — ABNORMAL LOW (ref >60)
GFR calc non Af Amer: 8 mL/min — ABNORMAL LOW (ref >60)
Glucose, Bld: 290 mg/dL — ABNORMAL HIGH (ref 70–99)
Potassium: 3.6 mmol/L (ref 3.5–5.1)
Sodium: 137 mmol/L (ref 135–145)

## 2019-09-13 LAB — GLUCOSE, CAPILLARY
Glucose-Capillary: 288 mg/dL — ABNORMAL HIGH (ref 70–99)
Glucose-Capillary: 316 mg/dL — ABNORMAL HIGH (ref 70–99)
Glucose-Capillary: 72 mg/dL (ref 70–99)

## 2019-09-13 LAB — OCCULT BLOOD X 1 CARD TO LAB, STOOL: Fecal Occult Bld: NEGATIVE

## 2019-09-13 LAB — PREPARE RBC (CROSSMATCH)

## 2019-09-13 MED ORDER — INSULIN ASPART 100 UNIT/ML ~~LOC~~ SOLN: 3.0000 [IU] | Freq: Three times a day (TID) | SUBCUTANEOUS | Status: DC

## 2019-09-13 MED ORDER — SODIUM CHLORIDE 0.9 % IV SOLN
1.5000 g | INTRAVENOUS | Status: DC
  Filled 2019-09-13: qty 1.5

## 2019-09-13 MED ORDER — INSULIN ASPART 100 UNIT/ML ~~LOC~~ SOLN
0.0000 [IU] | Freq: Three times a day (TID) | SUBCUTANEOUS | Status: DC
  Administered 2019-09-13: 15 [IU] via SUBCUTANEOUS
  Administered 2019-09-14: 08:00:00 4 [IU] via SUBCUTANEOUS

## 2019-09-13 MED ORDER — SENNOSIDES-DOCUSATE SODIUM 8.6-50 MG PO TABS
2.0000 | ORAL_TABLET | Freq: Two times a day (BID) | ORAL | Status: DC
  Filled 2019-09-13: qty 2

## 2019-09-13 MED ORDER — SODIUM CHLORIDE 0.9% IV SOLUTION: Freq: Once | INTRAVENOUS | Status: DC

## 2019-09-13 MED ORDER — CEFAZOLIN SODIUM-DEXTROSE 2-4 GM/100ML-% IV SOLN: 2.0000 g | INTRAVENOUS | Status: DC

## 2019-09-13 NOTE — Progress Notes (Signed)
PROGRESS NOTE    Duane Hall  YTK:[id number] DOB: [birth year] DOA: 09/08/2019 PCP: Heywood Bene, PA-C   Brief Narrative:   63 year old gentleman with prior history of type II by diabetes, asthma, stage III CKD was sent to ED for worsening renal parameters.  On admission patient also reports progressive worsening of the lower extremity swelling and reports intolerance to diuretics (gives him diarrhea). On arrival to ED his creatinine was 6.8, whereas his baseline creatinine is around 2.  He continues to be fluid overloaded with bilateral lower extremity 3+ edema.  Nephrology was consulted.  He underwent kidney biopsy on 09/10/2019 with results pending. Meanwhile his creatinine continues to worsen to 7 today.  Vein mapping ordered and vascular surgery consulted for av  fistula placement which will be scheduled to be done on Tuesday. No signs of Uremia. Electrolytes wnl and no urgent indication of HD as per nephrology.  Pt seen and examined today, was frustrated that he is not able to get enough fluids to drink at the time of the meals and wants to be known about the procedures and tests before hand.    Assessment & Plan:   Principal Problem:   Volume overload Active Problems:   Type II diabetes mellitus (HCC)   Asthma   CKD (chronic kidney disease)   Anemia of chronic disease   Hypokalemia  Acute on stage III CKD Progression of renal disease probably secondary to worsening of diabetic nephropathy. Renal ultrasound showed chronic medical renal disease without any obstruction.   Urine analysis is negative for infection. Nephrology on board and IR consulted and he underwent renal biopsy on 09/10/2019 and results are pending. Further work-up negative with the exception of low C4.  No urgent indication for dialysis, as he does not appear to be uremic and his potassium is wnl.  Vein mapping done  by nephrology and vascular consulted for HD access. Plan for AV fistula on Tuesday by  vascular surgery.  Patient's creatinine is 7.03 today and continues to rise slowly    Fluid overload/hypervolemia with bilateral led edema.  Bumetanide increased to 2 mg 3 times daily, without much diuresis and he continues to be fluid overloaded. Metolazone added by nephrology to augment diuresis.    Continue with strict intake and output and monitor urine output.    Hypertension Sub optimally controlled. Will add hydralazine prn IV.   Metolazone added. Monitor.    Type 2 diabetes mellitus. CBG (last 3)  Recent Labs    09/12/19 2123 09/13/19 1049 09/13/19 1728  GLUCAP 144* 288* 316*   Not well controlled with hyperglycemia. Hemo-globin A1c 6.5 Increased the SSI to resistant scale and add 3 units TIDAC.    Anemia of chronic disease/anemia secondary to end-stage renal disease iron deficiency anemia Low iron levels, iron supplementation  And aranesp added Stool for occult blood ordered and pending  Hemoglobin around 7. , transfuse 1 unit of prbc transfusion and repeat h&h in am.   Asthma No wheezing heard on exam today   hypokalemia Replaced  Constipation:  Added senna and colace and miralax.   DVT prophylaxis: Subcu heparin Code Status: Full code Family Communication:  None at bedside.  Disposition Plan: pending vascular surgery work up.    Consultants:   Nephrology  Vascular surgery.   IR  Procedures: vein mapping   Antimicrobials: none.   Subjective: Frustrated that he is not getting enough fluids during meals and we are disturbing his sleep.  Not happy about the blood  transfusion but agreed.    Objective: Vitals:   09/12/19 2143 09/13/19 0530 09/13/19 1453 09/13/19 1729  BP: (!) 151/73 (!) 142/75 (!) 165/78 (!) 153/83  Pulse: 91 93 88 86  Resp: 18 16 18 17   Temp: 98.8 F (37.1 C) 98.6 F (37 C) 98.3 F (36.8 C) 98.6 F (37 C)  TempSrc: Oral Oral Oral Oral  SpO2: 100% 99% 98% 100%  Weight:      Height:        Intake/Output Summary  (Last 24 hours) at 09/13/2019 1733 Last data filed at 09/13/2019 1300 Gross per 24 hour  Intake 1176 ml  Output 650 ml  Net 526 ml   Filed Weights   09/10/19 1017 09/11/19 1000 09/11/19 1400  Weight: 72.1 kg 69.7 kg 69.7 kg    Examination:  General exam: comfortable. Not in distress.  Respiratory system:  Air entry fair, no wheezing or rhonchi.  Cardiovascular system: s1s2, RRR, 3+ leg edema.  Gastrointestinal system: Abdomen is soft no tenderness, bowel sounds good.  Central nervous system: alert and oriented  Extremities 3 + leg edema.  Skin: no rashes seen.  Psychiatry: appropriate mood.     Data Reviewed: I have personally reviewed following labs and imaging studies  CBC: Recent Labs  Lab 09/08/19 1710 09/08/19 2140 09/10/19 0340 09/11/19 0340 09/12/19 0243 09/13/19 0438  WBC 9.5  --  6.8 8.1 7.3 6.3  NEUTROABS 5.8  --  3.7  --   --   --   HGB 9.2* 8.8* 7.6* 7.2* 7.4* 7.0*  HCT 26.3* 26.0* 22.2* 21.4* 22.0* 20.5*  MCV 87.1  --  88.1 89.5 88.7 88.7  PLT 243  --  185 182 194 578   Basic Metabolic Panel: Recent Labs  Lab 09/08/19 2130  09/09/19 1049 09/10/19 0340 09/11/19 0340 09/12/19 0243 09/13/19 1022  NA 138   < > 138 139 141 139 137  K 2.9*   < > 3.6 3.3* 3.7 4.1 3.6  CL 100   < > 101 105 107 105 103  CO2 23  --  23 24 24 22 22   GLUCOSE 169*   < > 182* 185* 218* 217* 290*  BUN 58*   < > 62* 64* 69* 73* 78*  CREATININE 6.86*   < > 6.97* 6.85* 7.07* 6.92* 7.03*  CALCIUM 7.6*  --  7.3* 7.4* 7.5* 7.6* 7.6*  MG 1.7  --   --   --   --   --   --   PHOS 6.8*  --   --   --   --   --   --    < > = values in this interval not displayed.   GFR: Estimated Creatinine Clearance: 10.9 mL/min (A) (by C-G formula based on SCr of 7.03 mg/dL (H)). Liver Function Tests: Recent Labs  Lab 09/08/19 1710 09/08/19 2130  AST 12*  --   ALT 11  --   ALKPHOS 106  --   BILITOT 0.2*  --   PROT 4.9*  --   ALBUMIN 2.4* 2.3*   No results for input(s): LIPASE, AMYLASE in  the last 168 hours. No results for input(s): AMMONIA in the last 168 hours. Coagulation Profile: Recent Labs  Lab 09/09/19 1428 09/10/19 0340  INR 1.1 1.1   Cardiac Enzymes: No results for input(s): CKTOTAL, CKMB, CKMBINDEX, TROPONINI in the last 168 hours. BNP (last 3 results) No results for input(s): PROBNP in the last 8760 hours. HbA1C: No results for  input(s): HGBA1C in the last 72 hours. CBG: Recent Labs  Lab 09/12/19 1345 09/12/19 1733 09/12/19 2123 09/13/19 1049 09/13/19 1728  GLUCAP 192* 229* 144* 288* 316*   Lipid Profile: No results for input(s): CHOL, HDL, LDLCALC, TRIG, CHOLHDL, LDLDIRECT in the last 72 hours. Thyroid Function Tests: No results for input(s): TSH, T4TOTAL, FREET4, T3FREE, THYROIDAB in the last 72 hours. Anemia Panel: No results for input(s): VITAMINB12, FOLATE, FERRITIN, TIBC, IRON, RETICCTPCT in the last 72 hours. Sepsis Labs: No results for input(s): PROCALCITON, LATICACIDVEN in the last 168 hours.  Recent Results (from the past 240 hour(s))  SARS Coronavirus 2 [facility] order, Performed in [facility] hospital lab) Nasopharyngeal Nasopharyngeal Swab     Status: None   Collection Time: 09/08/19  9:33 PM   Specimen: Nasopharyngeal Swab  Result Value Ref Range Status   SARS Coronavirus 2 NEGATIVE NEGATIVE Final    Comment: (NOTE) If result is NEGATIVE SARS-CoV-2 target nucleic acids are NOT DETECTED. The SARS-CoV-2 RNA is generally detectable in upper and lower  respiratory specimens during the acute phase of infection. The lowest  concentration of SARS-CoV-2 viral copies this assay can detect is 250  copies / mL. A negative result does not preclude SARS-CoV-2 infection  and should not be used as the sole basis for treatment or other  patient management decisions.  A negative result may occur with  improper specimen collection / handling, submission of specimen other  than nasopharyngeal swab, presence of viral mutation(s) within the   areas targeted by this assay, and inadequate number of viral copies  (<250 copies / mL). A negative result must be combined with clinical  observations, patient history, and epidemiological information. If result is POSITIVE SARS-CoV-2 target nucleic acids are DETECTED. The SARS-CoV-2 RNA is generally detectable in upper and lower  respiratory specimens dur ing the acute phase of infection.  Positive  results are indicative of active infection with SARS-CoV-2.  Clinical  correlation with patient history and other diagnostic information is  necessary to determine patient infection status.  Positive results do  not rule out bacterial infection or co-infection with other viruses. If result is PRESUMPTIVE POSTIVE SARS-CoV-2 nucleic acids MAY BE PRESENT.   A presumptive positive result was obtained on the submitted specimen  and confirmed on repeat testing.  While 2019 novel coronavirus  (SARS-CoV-2) nucleic acids may be present in the submitted sample  additional confirmatory testing may be necessary for epidemiological  and / or clinical management purposes  to differentiate between  SARS-CoV-2 and other Sarbecovirus currently known to infect humans.  If clinically indicated additional testing with an alternate test  methodology [phone]) is advised. The SARS-CoV-2 RNA is generally  detectable in upper and lower respiratory sp ecimens during the acute  phase of infection. The expected result is Negative. Fact Sheet for Patients:  StrictlyIdeas.no Fact Sheet for Healthcare Providers: BankingDealers.co.za This test is not yet approved or cleared by the [place] FDA and has been authorized for detection and/or diagnosis of SARS-CoV-2 by FDA under an Emergency Use Authorization (EUA).  This EUA will remain in effect (meaning this test can be used) for the duration of the COVID-19 declaration under Section 564(b)(1) of the Act, 21 U.S.C.  section 360bbb-3(b)(1), unless the authorization is terminated or revoked sooner. Performed at [facility], [place] [address], [place], [place] [address]          Radiology Studies: No results found.      Scheduled Meds: . sodium chloride  Intravenous Once  . ferrous sulfate  325 mg Oral Q breakfast  . heparin  5,000 Units Subcutaneous Q8H  . insulin aspart  0-20 Units Subcutaneous TID WC  . insulin aspart  0-5 Units Subcutaneous QHS  . insulin aspart  3 Units Subcutaneous TID WC  . loperamide  4 mg Oral Once  . metolazone  5 mg Oral Daily  . nicotine  21 mg Transdermal Daily  . senna-docusate  2 tablet Oral BID   Continuous Infusions: . bumetanide (BUMEX) IV 3 mg (09/13/19 1559)  . [START ON 09/14/2019] cefUROXime (ZINACEF)  IV       LOS: 5 days    Time spent: 25 minutes.     Hosie Poisson, MD [facility] Pager [phone]   If 7PM-7AM, please contact night-coverage [email or web address] Password [facility] 09/13/2019, 5:33 PM

## 2019-09-13 NOTE — H&P (View-Only) (Signed)
      Progress Note    09/13/2019 4:14 PM * No surgery date entered *  Subjective: No overnight issues  Vitals:   09/13/19 0530 09/13/19 1453  BP: (!) 142/75 (!) 165/78  Pulse: 93 88  Resp: 16 18  Temp: 98.6 F (37 C) 98.3 F (36.8 C)  SpO2: 99% 98%    Physical Exam: Awake alert oriented answers to questions Bilateral radial pulses are palpable  CBC    Component Value Date/Time   WBC 6.3 09/13/2019 0438   RBC 2.31 (L) 09/13/2019 0438   HGB 7.0 (L) 09/13/2019 0438   HCT 20.5 (L) 09/13/2019 0438   PLT 193 09/13/2019 0438   MCV 88.7 09/13/2019 0438   MCV 93.0 09/07/2013 0855   MCH 30.3 09/13/2019 0438   MCHC 34.1 09/13/2019 0438   RDW 13.7 09/13/2019 0438   LYMPHSABS 1.3 09/10/2019 0340   MONOABS 0.5 09/10/2019 0340   EOSABS 1.3 (H) 09/10/2019 0340   BASOSABS 0.0 09/10/2019 0340    BMET    Component Value Date/Time   NA 137 09/13/2019 1022   K 3.6 09/13/2019 1022   CL 103 09/13/2019 1022   CO2 22 09/13/2019 1022   GLUCOSE 290 (H) 09/13/2019 1022   BUN 78 (H) 09/13/2019 1022   CREATININE 7.03 (H) 09/13/2019 1022   CREATININE 2.22 (H) 09/07/2013 0845   CALCIUM 7.6 (L) 09/13/2019 1022   GFRNONAA 8 (L) 09/13/2019 1022   GFRAA 9 (L) 09/13/2019 1022    INR    Component Value Date/Time   INR 1.1 09/10/2019 0340     Intake/Output Summary (Last 24 hours) at 09/13/2019 1614 Last data filed at 09/13/2019 1300 Gross per 24 hour  Intake 1176 ml  Output 650 ml  Net 526 ml     Assessment:  63 y.o. male is here in need of permanent dialysis access.  Does not recall dialysis catheter at this time.  Has more suitable vein on the right although he is right-hand dominant.  Plan: OR tomorrow for right arm AV fistula versus graft.  We will hold on tunneled dialysis catheter for now.  Danette Weinfeld C. Donzetta Matters, MD [facility] [place] Office: [phone] Pager: [phone]  09/13/2019 4:14 PM

## 2019-09-13 NOTE — Progress Notes (Addendum)
      Progress Note    09/13/2019 4:14 PM * No surgery date entered *  Subjective: No overnight issues  Vitals:   09/13/19 0530 09/13/19 1453  BP: (!) 142/75 (!) 165/78  Pulse: 93 88  Resp: 16 18  Temp: 98.6 F (37 C) 98.3 F (36.8 C)  SpO2: 99% 98%    Physical Exam: Awake alert oriented answers to questions Bilateral radial pulses are palpable  CBC    Component Value Date/Time   WBC 6.3 09/13/2019 0438   RBC 2.31 (L) 09/13/2019 0438   HGB 7.0 (L) 09/13/2019 0438   HCT 20.5 (L) 09/13/2019 0438   PLT 193 09/13/2019 0438   MCV 88.7 09/13/2019 0438   MCV 93.0 09/07/2013 0855   MCH 30.3 09/13/2019 0438   MCHC 34.1 09/13/2019 0438   RDW 13.7 09/13/2019 0438   LYMPHSABS 1.3 09/10/2019 0340   MONOABS 0.5 09/10/2019 0340   EOSABS 1.3 (H) 09/10/2019 0340   BASOSABS 0.0 09/10/2019 0340    BMET    Component Value Date/Time   NA 137 09/13/2019 1022   K 3.6 09/13/2019 1022   CL 103 09/13/2019 1022   CO2 22 09/13/2019 1022   GLUCOSE 290 (H) 09/13/2019 1022   BUN 78 (H) 09/13/2019 1022   CREATININE 7.03 (H) 09/13/2019 1022   CREATININE 2.22 (H) 09/07/2013 0845   CALCIUM 7.6 (L) 09/13/2019 1022   GFRNONAA 8 (L) 09/13/2019 1022   GFRAA 9 (L) 09/13/2019 1022    INR    Component Value Date/Time   INR 1.1 09/10/2019 0340     Intake/Output Summary (Last 24 hours) at 09/13/2019 1614 Last data filed at 09/13/2019 1300 Gross per 24 hour  Intake 1176 ml  Output 650 ml  Net 526 ml     Assessment:  63 y.o. male is here in need of permanent dialysis access.  Does not recall dialysis catheter at this time.  Has more suitable vein on the right although he is right-hand dominant.  Plan: OR tomorrow for right arm AV fistula versus graft.  We will hold on tunneled dialysis catheter for now.  Texie Tupou C. Donzetta Matters, MD [facility] [place] [facility] Office: [phone] Pager: [phone]  09/13/2019 4:14 PM

## 2019-09-13 NOTE — Progress Notes (Signed)
[facility]    NEPHROLOGY PROGRESS NOTE  SUBJECTIVE: Patient seen and examined.  Continues to have lower extremity edema.  Complained of minimal pain.  Denies chest pain, shortness of breath, nausea, vomiting, diarrhea or dysuria.  All other review of systems are negative.   OBJECTIVE:  Vitals:   09/13/19 0530 09/13/19 1453  BP: (!) 142/75 (!) 165/78  Pulse: 93 88  Resp: 16 18  Temp: 98.6 F (37 C) 98.3 F (36.8 C)  SpO2: 99% 98%    Intake/Output Summary (Last 24 hours) at 09/13/2019 1558 Last data filed at 09/13/2019 1300 Gross per 24 hour  Intake 1176 ml  Output 650 ml  Net 526 ml      General:  AAOx3 NAD HEENT: MMM [place] AT anicteric sclera Neck:  No JVD, no adenopathy CV:  Heart RRR  Lungs:  L/S CTA bilaterally Abd:  abd SNT/ND with normal BS GU:  Bladder non-palpable Extremities: +3 bilateral lower extremity edema Skin:  No skin rash  MEDICATIONS:  . sodium chloride   Intravenous Once  . ferrous sulfate  325 mg Oral Q breakfast  . heparin  5,000 Units Subcutaneous Q8H  . insulin aspart  0-15 Units Subcutaneous TID WC  . insulin aspart  0-5 Units Subcutaneous QHS  . loperamide  4 mg Oral Once  . metolazone  5 mg Oral Daily  . nicotine  21 mg Transdermal Daily       LABS:   CBC Latest Ref Rng & Units 09/13/2019 09/12/2019 09/11/2019  WBC 4.0 - 10.5 K/uL 6.3 7.3 8.1  Hemoglobin 13.0 - 17.0 g/dL 7.0(L) 7.4(L) 7.2(L)  Hematocrit 39.0 - 52.0 % 20.5(L) 22.0(L) 21.4(L)  Platelets 150 - 400 K/uL 193 194 182    CMP Latest Ref Rng & Units 09/13/2019 09/12/2019 09/11/2019  Glucose 70 - 99 mg/dL 290(H) 217(H) 218(H)  BUN 8 - 23 mg/dL 78(H) 73(H) 69(H)  Creatinine 0.61 - 1.24 mg/dL 7.03(H) 6.92(H) 7.07(H)  Sodium 135 - 145 mmol/L 137 139 141  Potassium 3.5 - 5.1 mmol/L 3.6 4.1 3.7  Chloride 98 - 111 mmol/L 103 105 107  CO2 22 - 32 mmol/L 22 22 24   Calcium 8.9 - 10.3 mg/dL 7.6(L) 7.6(L) 7.5(L)  Total Protein 6.5 - 8.1 g/dL - - -  Total Bilirubin 0.3 -  1.2 mg/dL - - -  Alkaline Phos 38 - 126 U/L - - -  AST 15 - 41 U/L - - -  ALT 0 - 44 U/L - - -    Lab Results  Component Value Date   CALCIUM 7.6 (L) 09/13/2019   CAION 0.98 (L) 09/08/2019   PHOS 6.8 (H) 09/08/2019       Component Value Date/Time   COLORURINE YELLOW 09/09/2019 0343   APPEARANCEUR CLEAR 09/09/2019 0343   LABSPEC 1.009 09/09/2019 0343   PHURINE 5.0 09/09/2019 0343   GLUCOSEU 50 (A) 09/09/2019 0343   HGBUR MODERATE (A) 09/09/2019 0343   BILIRUBINUR NEGATIVE 09/09/2019 0343   KETONESUR NEGATIVE 09/09/2019 0343   PROTEINUR >=300 (A) 09/09/2019 0343   NITRITE NEGATIVE 09/09/2019 0343   LEUKOCYTESUR NEGATIVE 09/09/2019 0343      Component Value Date/Time   TCO2 24 09/08/2019 2140       Component Value Date/Time   IRON 41 (L) 09/10/2019 1044   TIBC 153 (L) 09/10/2019 1044   FERRITIN 128 09/10/2019 1044   IRONPCTSAT 27 09/10/2019 1044       ASSESSMENT/PLAN:     63M with progressive renal failure  and nephrotic proteinuria.  Also with some degree of hematuria.  This could be diabetic nephropathy, in fact it probably is.  However, given the rapid progression, renal biopsy performed on 09/10/2019.  1.  Acute renal failure.  Continues to have rapid progression of underlying renal disease.  Biopsy pending.  Serologies negative with the exception of a low C4.  Creatinine stable around 7.  No urgent indication for dialysis.  Will attempt to diurese further given persistent significant edema.  Has had minimal response to diuretics so far.  For AV fistula placement on Tuesday.  Would hold off on dialysis catheter placement as he has no clear uremic symptoms or urgent indication for starting dialysis.  Hopefully we can start dialysis with the fistula once it is matured.  We asked dialysis social worker to see patient.  2.  Hypervolemia.  Increased bumetanide to 3 mg q6hours and added metolazone. Can transition to p.o. upon discharge.  No evidence of CHF, and volume overload is  mostly LE edema.  3.  Hypertension.  Overall stable.  4.  Diabetes mellitus.  Continue tight diabetes control.  Suspect this is the etiology of his worsening renal function.  Suspect a collapsing variant of diabetic nephropathy.  5.  Anemia of chronic kidney disease.  Continue oral iron and aranesp.  Monitor hemoglobin.   [place], DO, [place]

## 2019-09-14 ENCOUNTER — Encounter (HOSPITAL_COMMUNITY)
Admission: EM | Disposition: A | Discharge status: Home / Self Care | Payer: Self-pay | Source: Home / Self Care | Attending: Internal Medicine

## 2019-09-14 ENCOUNTER — Encounter (HOSPITAL_COMMUNITY): Payer: Self-pay | Admitting: Certified Registered Nurse Anesthetist

## 2019-09-14 ENCOUNTER — Inpatient Hospital Stay (HOSPITAL_COMMUNITY): Payer: BC Managed Care – PPO | Admitting: Certified Registered Nurse Anesthetist

## 2019-09-14 ENCOUNTER — Ambulatory Visit (HOSPITAL_COMMUNITY): Admission: RE | Admit: 2019-09-14 | Payer: BLUE CROSS/BLUE SHIELD | Source: Ambulatory Visit

## 2019-09-14 ENCOUNTER — Encounter (HOSPITAL_COMMUNITY): Payer: Self-pay

## 2019-09-14 DIAGNOSIS — N185 Chronic kidney disease, stage 5: Secondary | ICD-10-CM

## 2019-09-14 HISTORY — PX: AV FISTULA PLACEMENT: SHX1204

## 2019-09-14 LAB — BASIC METABOLIC PANEL
Anion gap: 12 (ref 5–15)
BUN: 77 mg/dL — ABNORMAL HIGH (ref 8–23)
CO2: 23 mmol/L (ref 22–32)
Calcium: 7.7 mg/dL — ABNORMAL LOW (ref 8.9–10.3)
Chloride: 104 mmol/L (ref 98–111)
Creatinine, Ser: 7.27 mg/dL — ABNORMAL HIGH (ref 0.61–1.24)
GFR calc Af Amer: 9 mL/min — ABNORMAL LOW (ref >60)
GFR calc non Af Amer: 7 mL/min — ABNORMAL LOW (ref >60)
Glucose, Bld: 202 mg/dL — ABNORMAL HIGH (ref 70–99)
Potassium: 3.5 mmol/L (ref 3.5–5.1)
Sodium: 139 mmol/L (ref 135–145)

## 2019-09-14 LAB — CBC
HCT: 23.4 % — ABNORMAL LOW (ref 39.0–52.0)
Hemoglobin: 8.3 g/dL — ABNORMAL LOW (ref 13.0–17.0)
MCH: 31 pg (ref 26.0–34.0)
MCHC: 35.5 g/dL (ref 30.0–36.0)
MCV: 87.3 fL (ref 80.0–100.0)
Platelets: 169 10*3/uL (ref 150–400)
RBC: 2.68 MIL/uL — ABNORMAL LOW (ref 4.22–5.81)
RDW: 13.2 % (ref 11.5–15.5)
WBC: 6.1 10*3/uL (ref 4.0–10.5)
nRBC: 0 % (ref 0.0–0.2)

## 2019-09-14 LAB — BPAM RBC
Blood Product Expiration Date: 202010082359
ISSUE DATE / TIME: 202009141730
Unit Type and Rh: 9500

## 2019-09-14 LAB — TYPE AND SCREEN
ABO/RH(D): O NEG
Antibody Screen: NEGATIVE
Unit division: 0

## 2019-09-14 LAB — PROTIME-INR
INR: 1.1 (ref 0.8–1.2)
Prothrombin Time: 13.9 seconds (ref 11.4–15.2)

## 2019-09-14 LAB — GLUCOSE, CAPILLARY
Glucose-Capillary: 182 mg/dL — ABNORMAL HIGH (ref 70–99)
Glucose-Capillary: 123 mg/dL — ABNORMAL HIGH (ref 70–99)
Glucose-Capillary: 137 mg/dL — ABNORMAL HIGH (ref 70–99)

## 2019-09-14 SURGERY — ARTERIOVENOUS (AV) FISTULA CREATION
Anesthesia: Monitor Anesthesia Care | Site: Arm Upper | Laterality: Right

## 2019-09-14 MED ORDER — GLIPIZIDE ER 5 MG PO TB24: 5.0000 mg | ORAL_TABLET | Freq: Every day | ORAL | 0 refills | Status: DC

## 2019-09-14 MED ORDER — METOLAZONE 5 MG PO TABS: 5.0000 mg | ORAL_TABLET | Freq: Every day | ORAL | 1 refills | Status: DC

## 2019-09-14 MED ORDER — CEFUROXIME SODIUM 1.5 G IV SOLR: 1.5000 g | Freq: Once | INTRAVENOUS | Status: DC

## 2019-09-14 MED ORDER — SODIUM CHLORIDE 0.9 % IV SOLN
INTRAVENOUS | Status: AC
  Filled 2019-09-14: qty 1.2

## 2019-09-14 MED ORDER — FENTANYL CITRATE (PF) 100 MCG/2ML IJ SOLN
INTRAMUSCULAR | Status: DC | PRN
  Administered 2019-09-14: 25 ug via INTRAVENOUS

## 2019-09-14 MED ORDER — NICOTINE 21 MG/24HR TD PT24: 21.0000 mg | MEDICATED_PATCH | Freq: Every day | TRANSDERMAL | 0 refills | Status: DC

## 2019-09-14 MED ORDER — ONDANSETRON HCL 4 MG/2ML IJ SOLN
INTRAMUSCULAR | Status: DC | PRN
  Administered 2019-09-14: 4 mg via INTRAVENOUS

## 2019-09-14 MED ORDER — LIDOCAINE HCL (PF) 1 % IJ SOLN
INTRAMUSCULAR | Status: AC
  Filled 2019-09-14: qty 30

## 2019-09-14 MED ORDER — PROPOFOL 10 MG/ML IV BOLUS
INTRAVENOUS | Status: AC
  Filled 2019-09-14: qty 20

## 2019-09-14 MED ORDER — SENNOSIDES-DOCUSATE SODIUM 8.6-50 MG PO TABS: 2.0000 | ORAL_TABLET | Freq: Two times a day (BID) | ORAL | 0 refills | Status: DC

## 2019-09-14 MED ORDER — PROTAMINE SULFATE 10 MG/ML IV SOLN
INTRAVENOUS | Status: DC | PRN
  Administered 2019-09-14: 10 mg via INTRAVENOUS
  Administered 2019-09-14: 30 mg via INTRAVENOUS

## 2019-09-14 MED ORDER — FENTANYL CITRATE (PF) 250 MCG/5ML IJ SOLN
INTRAMUSCULAR | Status: AC
  Filled 2019-09-14: qty 5

## 2019-09-14 MED ORDER — MIDAZOLAM HCL 5 MG/5ML IJ SOLN
INTRAMUSCULAR | Status: DC | PRN
  Administered 2019-09-14: 1 mg via INTRAVENOUS

## 2019-09-14 MED ORDER — SODIUM CHLORIDE 0.9 % IV SOLN
1.5000 g | INTRAVENOUS | Status: AC
  Administered 2019-09-14: 1.5 g via INTRAVENOUS
  Filled 2019-09-14: qty 1.5

## 2019-09-14 MED ORDER — LIDOCAINE HCL (CARDIAC) PF 100 MG/5ML IV SOSY
PREFILLED_SYRINGE | INTRAVENOUS | Status: DC | PRN
  Administered 2019-09-14: 40 mg via INTRAVENOUS

## 2019-09-14 MED ORDER — SODIUM CHLORIDE 0.9 % IV SOLN
INTRAVENOUS | Status: DC | PRN
  Administered 2019-09-14: 50 ug/min via INTRAVENOUS

## 2019-09-14 MED ORDER — LIDOCAINE-EPINEPHRINE (PF) 1 %-1:200000 IJ SOLN
INTRAMUSCULAR | Status: DC | PRN
  Administered 2019-09-14: 30 mL

## 2019-09-14 MED ORDER — PROMETHAZINE HCL 25 MG/ML IJ SOLN: 6.2500 mg | INTRAMUSCULAR | Status: DC | PRN

## 2019-09-14 MED ORDER — SODIUM CHLORIDE 0.9 % IV SOLN
INTRAVENOUS | Status: DC | PRN
  Administered 2019-09-14 (×2): via INTRAVENOUS

## 2019-09-14 MED ORDER — BUMETANIDE 2 MG PO TABS
3.0000 mg | ORAL_TABLET | Freq: Three times a day (TID) | ORAL | Status: DC
  Filled 2019-09-14 (×2): qty 1

## 2019-09-14 MED ORDER — 0.9 % SODIUM CHLORIDE (POUR BTL) OPTIME
TOPICAL | Status: DC | PRN
  Administered 2019-09-14: 1000 mL

## 2019-09-14 MED ORDER — ONDANSETRON HCL 4 MG/2ML IJ SOLN
INTRAMUSCULAR | Status: AC
  Filled 2019-09-14: qty 2

## 2019-09-14 MED ORDER — BUMETANIDE 1 MG PO TABS: 3.0000 mg | ORAL_TABLET | Freq: Three times a day (TID) | ORAL | 0 refills | Status: DC

## 2019-09-14 MED ORDER — FERROUS SULFATE 325 (65 FE) MG PO TABS: 325.0000 mg | ORAL_TABLET | Freq: Every day | ORAL | 3 refills | Status: DC

## 2019-09-14 MED ORDER — FENTANYL CITRATE (PF) 100 MCG/2ML IJ SOLN: 25.0000 ug | INTRAMUSCULAR | Status: DC | PRN

## 2019-09-14 MED ORDER — MIDAZOLAM HCL 2 MG/2ML IJ SOLN
INTRAMUSCULAR | Status: AC
  Filled 2019-09-14: qty 2

## 2019-09-14 MED ORDER — LIDOCAINE HCL (PF) 1 % IJ SOLN
INTRAMUSCULAR | Status: DC | PRN
  Administered 2019-09-14: 25 mL

## 2019-09-14 MED ORDER — HEPARIN SODIUM (PORCINE) 1000 UNIT/ML IJ SOLN
INTRAMUSCULAR | Status: DC | PRN
  Administered 2019-09-14: 6000 [IU] via INTRAVENOUS

## 2019-09-14 MED ORDER — SODIUM CHLORIDE 0.9 % IV SOLN
INTRAVENOUS | Status: DC | PRN
  Administered 2019-09-14: 500 mL

## 2019-09-14 MED ORDER — LIDOCAINE-EPINEPHRINE (PF) 1 %-1:200000 IJ SOLN
INTRAMUSCULAR | Status: AC
  Filled 2019-09-14: qty 30

## 2019-09-14 MED ORDER — PROPOFOL 500 MG/50ML IV EMUL
INTRAVENOUS | Status: DC | PRN
  Administered 2019-09-14: 100 ug/kg/min via INTRAVENOUS

## 2019-09-14 SURGICAL SUPPLY — 52 items
ARMBAND PINK RESTRICT EXTREMIT (MISCELLANEOUS) ×6 IMPLANT
BAG DECANTER FOR FLEXI CONT (MISCELLANEOUS) ×3 IMPLANT
BIOPATCH RED 1 DISK 7.0 (GAUZE/BANDAGES/DRESSINGS) ×3 IMPLANT
CANISTER SUCT 3000ML PPV (MISCELLANEOUS) ×3 IMPLANT
CANNULA VESSEL 3MM 2 BLNT TIP (CANNULA) ×4 IMPLANT
CATH PALINDROME RT-P 15FX19CM (CATHETERS) IMPLANT
CATH PALINDROME RT-P 15FX23CM (CATHETERS) IMPLANT
CATH PALINDROME RT-P 15FX28CM (CATHETERS) IMPLANT
CATH PALINDROME RT-P 15FX55CM (CATHETERS) IMPLANT
CHLORAPREP W/TINT 26 (MISCELLANEOUS) ×3 IMPLANT
CLIP VESOCCLUDE MED 6/CT (CLIP) ×3 IMPLANT
CLIP VESOCCLUDE SM WIDE 6/CT (CLIP) ×5 IMPLANT
COVER PROBE W GEL 5X96 (DRAPES) IMPLANT
COVER SURGICAL LIGHT HANDLE (MISCELLANEOUS) ×3 IMPLANT
COVER WAND RF STERILE (DRAPES) ×2 IMPLANT
DECANTER SPIKE VIAL GLASS SM (MISCELLANEOUS) ×3 IMPLANT
DERMABOND ADVANCED (GAUZE/BANDAGES/DRESSINGS) ×1
DERMABOND ADVANCED .7 DNX12 (GAUZE/BANDAGES/DRESSINGS) ×2 IMPLANT
DRAPE C-ARM 42X72 X-RAY (DRAPES) ×3 IMPLANT
DRAPE CHEST BREAST 15X10 FENES (DRAPES) ×3 IMPLANT
ELECT REM PT RETURN 9FT ADLT (ELECTROSURGICAL) ×3
ELECTRODE REM PT RTRN 9FT ADLT (ELECTROSURGICAL) ×2 IMPLANT
GAUZE 4X4 16PLY RFD (DISPOSABLE) ×3 IMPLANT
GLOVE BIO SURGEON STRL SZ7.5 (GLOVE) ×3 IMPLANT
GLOVE BIOGEL PI IND STRL 8 (GLOVE) ×2 IMPLANT
GLOVE BIOGEL PI INDICATOR 8 (GLOVE) ×1
GOWN STRL REUS W/ TWL LRG LVL3 (GOWN DISPOSABLE) ×6 IMPLANT
GOWN STRL REUS W/TWL LRG LVL3 (GOWN DISPOSABLE) ×3
KIT BASIN OR (CUSTOM PROCEDURE TRAY) ×3 IMPLANT
KIT TURNOVER KIT B (KITS) ×3 IMPLANT
NDL 18GX1X1/2 (RX/OR ONLY) (NEEDLE) ×2 IMPLANT
NDL HYPO 25GX1X1/2 BEV (NEEDLE) ×2 IMPLANT
NEEDLE 18GX1X1/2 (RX/OR ONLY) (NEEDLE) ×3 IMPLANT
NEEDLE HYPO 25GX1X1/2 BEV (NEEDLE) ×3 IMPLANT
NS IRRIG 1000ML POUR BTL (IV SOLUTION) ×3 IMPLANT
PACK CV ACCESS (CUSTOM PROCEDURE TRAY) ×3 IMPLANT
PACK SURGICAL SETUP 50X90 (CUSTOM PROCEDURE TRAY) ×3 IMPLANT
PAD ARMBOARD 7.5X6 YLW CONV (MISCELLANEOUS) ×6 IMPLANT
SPONGE SURGIFOAM ABS GEL 100 (HEMOSTASIS) IMPLANT
SUT ETHILON 3 0 PS 1 (SUTURE) ×3 IMPLANT
SUT PROLENE 6 0 BV (SUTURE) ×4 IMPLANT
SUT VIC AB 3-0 SH 27 (SUTURE) ×2
SUT VIC AB 3-0 SH 27X BRD (SUTURE) ×2 IMPLANT
SUT VICRYL 4-0 PS2 18IN ABS (SUTURE) ×5 IMPLANT
SYR 10ML LL (SYRINGE) ×3 IMPLANT
SYR 20ML LL LF (SYRINGE) ×6 IMPLANT
SYR 5ML LL (SYRINGE) ×6 IMPLANT
SYR CONTROL 10ML LL (SYRINGE) ×3 IMPLANT
TOWEL GREEN STERILE (TOWEL DISPOSABLE) ×6 IMPLANT
TOWEL GREEN STERILE FF (TOWEL DISPOSABLE) ×3 IMPLANT
UNDERPAD 30X30 (UNDERPADS AND DIAPERS) ×3 IMPLANT
WATER STERILE IRR 1000ML POUR (IV SOLUTION) ×3 IMPLANT

## 2019-09-14 NOTE — Discharge Instructions (Signed)
° °  [facility] ° °Discharge Instructions ° °AV Fistula or Graft Surgery for Dialysis Access ° °Please refer to the following instructions for your post-procedure care. Your surgeon or physician assistant will discuss any changes with you. ° °Activity ° °You may drive the day following your surgery, if you are comfortable and no longer taking prescription pain medication. Resume full activity as the soreness in your incision resolves. ° °Bathing/Showering ° °You may shower after you go home. Keep your incision dry for 48 hours. Do not soak in a bathtub, hot tub, or swim until the incision heals completely. You may not shower if you have a hemodialysis catheter. ° °Incision Care ° °Clean your incision with mild soap and water after 48 hours. Pat the area dry with a clean towel. You do not need a bandage unless otherwise instructed. Do not apply any ointments or creams to your incision. You may have skin glue on your incision. Do not peel it off. It will come off on its own in about one week. Your arm may swell a bit after surgery. To reduce swelling use pillows to elevate your arm so it is above your heart. Your doctor will tell you if you need to lightly wrap your arm with an ACE bandage. ° °Diet ° °Resume your normal diet. There are not special food restrictions following this procedure. In order to heal from your surgery, it is CRITICAL to get adequate nutrition. Your body requires vitamins, minerals, and protein. Vegetables are the best source of vitamins and minerals. Vegetables also provide the perfect balance of protein. Processed food has little nutritional value, so try to avoid this. ° °Medications ° °Resume taking all of your medications. If your incision is causing pain, you may take over-the counter pain relievers such as acetaminophen (Tylenol). If you were prescribed a stronger pain medication, please be aware these medications can cause nausea and constipation. Prevent  nausea by taking the medication with a snack or meal. Avoid constipation by drinking plenty of fluids and eating foods with high amount of fiber, such as fruits, vegetables, and grains. Do not take Tylenol if you are taking prescription pain medications. ° ° ° ° °Follow up °Your surgeon may want to see you in the office following your access surgery. If so, this will be arranged at the time of your surgery. ° °Please call us immediately for any of the following conditions: ° °Increased pain, redness, drainage (pus) from your incision site °Fever of 101 degrees or higher °Severe or worsening pain at your incision site °Hand pain or numbness. ° °Reduce your risk of vascular disease: ° °Stop smoking. If you would like help, call [facility] at 1-800-QUIT-NOW (1-800-784-8669) or [place] at 336-586-4000 ° °Manage your cholesterol °Maintain a desired weight °Control your diabetes °Keep your blood pressure down ° °Dialysis ° °It will take several weeks to several months for your new dialysis access to be ready for use. Your surgeon will determine when it is OK to use it. Your nephrologist will continue to direct your dialysis. You can continue to use your Permcath until your new access is ready for use. ° °If you have any questions, please call the office at [phone]. ° °

## 2019-09-14 NOTE — Interval H&P Note (Signed)
History and Physical Interval Note:  09/14/2019 11:20 AM  Duane Hall  has presented today for surgery, with the diagnosis of end stage renal disease.  The various methods of treatment have been discussed with the patient and family. After consideration of risks, benefits and other options for treatment, the patient has consented to  Procedure(s): ARTERIOVENOUS (AV) FISTULA CREATION (Right) INSERTION OF DIALYSIS CATHETER (N/A) as a surgical intervention.  The patient's history has been reviewed, patient examined, no change in status, stable for surgery.  I have reviewed the patient's chart and labs.  Questions were answered to the patient's satisfaction.     Deitra Mayo

## 2019-09-14 NOTE — Anesthesia Preprocedure Evaluation (Addendum)
Anesthesia Evaluation  Patient identified by MRN, date of birth, ID band Patient awake    Reviewed: Allergy & Precautions, NPO status , Patient's Chart, lab work & pertinent test results  Airway Mallampati: II  TM Distance: >3 FB Neck ROM: Full    Dental  (+) Dental Advisory Given   Pulmonary asthma , Current Smoker,    breath sounds clear to auscultation       Cardiovascular hypertension, Pt. on medications  Rhythm:Regular Rate:Normal     Neuro/Psych negative neurological ROS     GI/Hepatic negative GI ROS, (+) Hepatitis -, C  Endo/Other  diabetes, Type 2, Oral Hypoglycemic Agents  Renal/GU CRFRenal disease     Musculoskeletal   Abdominal   Peds  Hematology  (+) anemia ,   Anesthesia Other Findings   Reproductive/Obstetrics                             Anesthesia Physical Anesthesia Plan  ASA: III  Anesthesia Plan: MAC   Post-op Pain Management:    Induction: Intravenous  PONV Risk Score and Plan: 0 and Propofol infusion, Ondansetron and Treatment may vary due to age or medical condition  Airway Management Planned: Natural Airway and Simple Face Mask  Additional Equipment:   Intra-op Plan:   Post-operative Plan:   Informed Consent: I have reviewed the patients History and Physical, chart, labs and discussed the procedure including the risks, benefits and alternatives for the proposed anesthesia with the patient or authorized representative who has indicated his/her understanding and acceptance.       Plan Discussed with: CRNA  Anesthesia Plan Comments:        Anesthesia Quick Evaluation

## 2019-09-14 NOTE — Progress Notes (Signed)
Inpatient Diabetes Program Recommendations  AACE/ADA: New Consensus Statement on Inpatient Glycemic Control (2015)  Target Ranges:  Prepandial:   less than 140 mg/dL      Peak postprandial:   less than 180 mg/dL (1-2 hours)      Critically ill patients:  140 - 180 mg/dL   Lab Results  Component Value Date   GLUCAP 123 (H) 09/14/2019   HGBA1C 6.5 (H) 09/08/2019    Review of Glycemic Control Results for Duane Hall, Duane Hall (MRN [id number]) as of 09/14/2019 11:54  Ref. Range 09/13/2019 10:49 09/13/2019 17:28 09/13/2019 21:49 09/14/2019 08:08 09/14/2019 10:36  Glucose-Capillary Latest Ref Range: 70 - 99 mg/dL 288 (H) Novolog 8 units 316 (H) Novolog 15 units 72 182 (H) Novolog 4 units 123 (H)   Diabetes history: DM2 Outpatient Diabetes medications: Januvia 50 (not taking) + Glucotrol 10 mg XL qd. Current orders for Inpatient glycemic control: Novolog 3 units tid meal coverage + Novolog resistant correction scale tid + hs 0-5 units  Inpatient Diabetes Program Recommendations:   -Decrease Novolog correction to sensitive tid  Noted insulin on hold currently due to being in procedure.  Thank you, Nani Gasser. Rollo Farquhar, RN, MSN, CDE  Diabetes Coordinator Inpatient Glycemic Control Team Team Pager [phone] (8am-5pm) 09/14/2019 11:58 AM

## 2019-09-14 NOTE — Transfer of Care (Signed)
Immediate Anesthesia Transfer of Care Note  Patient: Newell Wafer [facility]  Procedure(s) Performed: Right Arm Basilic Vein transposition (Right Arm Upper)  Patient Location: PACU  Anesthesia Type:MAC combined with regional for post-op pain  Level of Consciousness: awake, alert  and oriented  Airway & Oxygen Therapy: Patient Spontanous Breathing  Post-op Assessment: Report given to RN and Post -op Vital signs reviewed and stable  Post vital signs: Reviewed and stable  Last Vitals:  Vitals Value Taken Time  BP 122/66 09/14/19 1432  Temp    Pulse 82 09/14/19 1439  Resp 14 09/14/19 1439  SpO2 97 % 09/14/19 1439  Vitals shown include unvalidated device data.  Last Pain:  Vitals:   09/14/19 1430  TempSrc:   PainSc: Asleep         Complications: No apparent anesthesia complications

## 2019-09-14 NOTE — Discharge Summary (Signed)
Physician Discharge Summary  Duane [facility] YKZ:[id number] DOB: 02-06-1956 DOA: 09/08/2019  PCP: Heywood Bene, PA-C  Admit date: 09/08/2019 Discharge date: 09/14/2019  Admitted From: Home.  Disposition:  Home.  Recommendations for Outpatient Follow-up:  1. Follow up with PCP in 1-2 weeks 2. Please obtain BMP/CBC in one week 3. Please follow up with nephrology in one week.  4. Please follow up with vascular surgery as recommended.     Discharge Condition: guarded.  CODE STATUS:full code.  Diet recommendation: Heart Healthy    Brief/Interim Summary: 63 year old gentleman with prior history of type II by diabetes, asthma, stage III CKD was sent to ED for worsening renal parameters.  On admission patient also reports progressive worsening of the lower extremity swelling and reports intolerance to diuretics (gives him diarrhea). On arrival to ED his creatinine was 6.8, whereas his baseline creatinine is around 2.  He continues to be fluid overloaded with bilateral lower extremity 3+ edema.  Nephrology was consulted.  He underwent kidney biopsy on 09/10/2019 with results pending. Meanwhile his creatinine continues to worsen to >7 today.  Vein mapping done. and vascular surgery consulted for av  fistula placement which will be done on 09/14/2019/ No signs of Uremia. Electrolytes wnl and no urgent indication of HD as per nephrology.    Discharge Diagnoses:  Principal Problem:   Volume overload Active Problems:   Type II diabetes mellitus (HCC)   Asthma   CKD (chronic kidney disease)   Anemia of chronic disease   Hypokalemia  Acute on stage III CKD Progression of renal disease probably secondary to worsening of diabetic nephropathy. Renal ultrasound showed chronic medical renal disease without any obstruction.   Urine analysis is negative for infection. Nephrology on board and IR consulted and he underwent renal biopsy on 09/10/2019 and results are pending. Further work-up  negative with the exception of low C4.  No urgent indication for dialysis, as he does not appear to be uremic and his potassium is wnl.  Vein mapping done  by nephrology and vascular consulted for HD access. Underwent  AV fistula on Tuesday by vascular surgery.     Fluid overload/hypervolemia with bilateral led edema.  Bumetanide increased to 3 mg 3 times daily, and Metolazone added by nephrology to augment diuresis.    Continue with strict intake and output and monitor urine output.    Hypertension Sub optimally controlled. Will add hydralazine prn IV.   Metolazone added. Monitor.    Type 2 diabetes mellitus. CBG (last 3)  Recent Labs (last 2 labs)    Hemo-globin A1c 6.5, decreased the dose of glipizide to 5 mg.    Anemia of chronic disease/anemia secondary to end-stage renal disease iron deficiency anemia Low iron levels, iron supplementation  And aranesp added Stool for occult blood is negative. Hemoglobin around 7. , transfused 1 unit of prbc and repeat hemoglobin around 8.  Asthma No wheezing heard on exam today   hypokalemia Replaced  Constipation:  Added senna and colace and miralax.    Discharge Instructions  Discharge Instructions    Diet - low sodium heart healthy   Complete by: As directed    Discharge instructions   Complete by: As directed    Please follow up with nephrology as recommended in one week.     Allergies as of 09/14/2019   No Known Allergies     Medication List    STOP taking these medications   amoxicillin-clavulanate 875-125 MG tablet Commonly known as: Augmentin  Auto-Lancets Misc   doxycycline 100 MG capsule Commonly known as: VIBRAMYCIN   losartan 50 MG tablet Commonly known as: COZAAR   sitaGLIPtin 50 MG tablet Commonly known as: JANUVIA   traMADol 50 MG tablet Commonly known as: ULTRAM     TAKE these medications   aspirin EC 81 MG tablet Take 81 mg by mouth as needed for mild pain.   bumetanide  1 MG tablet Commonly known as: BUMEX Take 3 tablets (3 mg total) by mouth 3 (three) times daily. What changed:   medication strength  how much to take  when to take this   ferrous sulfate 325 (65 FE) MG tablet Take 1 tablet (325 mg total) by mouth daily with breakfast. Start taking on: September 15, 2019   glipiZIDE 5 MG 24 hr tablet Commonly known as: GLUCOTROL XL Take 1 tablet (5 mg total) by mouth daily with breakfast. What changed:   medication strength  how much to take   metolazone 5 MG tablet Commonly known as: ZAROXOLYN Take 1 tablet (5 mg total) by mouth daily.   nicotine 21 mg/24hr patch Commonly known as: NICODERM CQ - dosed in mg/24 hours Place 1 patch (21 mg total) onto the skin daily. Start taking on: September 15, 2019   omeprazole 40 MG capsule Commonly known as: PRILOSEC Take 40 mg by mouth daily.   senna-docusate 8.6-50 MG tablet Commonly known as: Senokot-S Take 2 tablets by mouth 2 (two) times daily.   Vitamin D (Ergocalciferol) 1.25 MG (50000 UT) Caps capsule Commonly known as: DRISDOL Take 1 capsule by mouth once a week. Sundays      Follow-up Information    Angelia Mould, MD In 6 weeks.   Specialties: Vascular Surgery, Cardiology Why: Office will call you to arrange your appt (sent) Contact information: [place] [address] [phone]        Heywood Bene, PA-C. Schedule an appointment as soon as possible for a visit in 1 week(s).   Specialty: Physician Assistant Contact information: [address] [place] [address] [phone]        Justin Mend, MD. Schedule an appointment as soon as possible for a visit in 1 week(s).   Specialty: Internal Medicine Contact information: [place] [address] [phone]          No Known Allergies  Consultations:  vascular surgery.   Nephrology.    Procedures/Studies: Dg Chest 2 View  Result Date:  09/08/2019 CLINICAL DATA:  Bilateral lower extremity swelling x2 months EXAM: CHEST - 2 VIEW COMPARISON:  CT chest dated 02/24/2019 FINDINGS: Small bilateral pleural effusions.  No Kaylum interstitial edema. Mild bibasilar opacities, likely atelectasis. No pneumothorax. The heart is normal in size.  Thoracic aortic atherosclerosis. Visualized osseous structures are within normal limits. IMPRESSION: Small bilateral pleural effusions.  No Yuvin interstitial edema. Electronically Signed   By: Julian Hy M.D.   On: 09/08/2019 18:06   US Renal  Result Date: 09/08/2019 CLINICAL DATA:  Chronic kidney disease EXAM: RENAL / URINARY TRACT ULTRASOUND COMPLETE COMPARISON:  10/08/2013 FINDINGS: Right Kidney: Renal measurements: 10.1 x 5.7 x 5.1 cm = volume: 155 mL. Mildly increased echotexture diffusely. No mass or hydronephrosis. Left Kidney: Renal measurements: 10.3 x 4.8 x 4.9 cm = volume: 127 mL. Diffusely increased echotexture. No hydronephrosis. 2.7 cm cyst in the upper pole. No hydronephrosis. Bladder: Diffusely thickened irregular bladder wall. IMPRESSION: Increased echotexture in the kidneys bilaterally compatible with chronic medical renal  disease. No hydronephrosis. Thickened, irregular bladder wall. While this could be related to cystitis, infiltrating malignancy cannot be excluded. Recommend correlation urinalysis and possible cystoscopy if felt clinically indicated. Electronically Signed   By: Rolm Baptise M.D.   On: 09/08/2019 23:57   US Biopsy (kidney)  Result Date: 09/10/2019 INDICATION: 63 year old male with a history chronic kidney disease/nephrotic syndrome EXAM: ULTRASOUND-GUIDED MEDICAL RENAL BIOPSY MEDICATIONS: None. ANESTHESIA/SEDATION: Moderate (conscious) sedation was employed during this procedure. A total of Versed 1.0 mg and Fentanyl 50 mcg was administered intravenously. Moderate Sedation Time: 10 minutes. The patient's level of consciousness and vital signs were monitored continuously  by radiology nursing throughout the procedure under my direct supervision. FLUOROSCOPY TIME:  None COMPLICATIONS: None PROCEDURE: Informed written consent was obtained from the patient after a thorough discussion of the procedural risks, benefits and alternatives. All questions were addressed. Maximal Sterile Barrier Technique was utilized including caps, mask, sterile gowns, sterile gloves, sterile drape, hand hygiene and skin antiseptic. A timeout was performed prior to the initiation of the procedure. Patient was positioned prone position on the gantry table. Images were stored sent to PACs. Once the patient is prepped and draped in the usual sterile fashion, the skin and subcutaneous tissues overlying the left kidney were generously infiltrated 1% lidocaine for local anesthesia. Using ultrasound guidance, a 15 gauge guide needle was advanced into the lower cortex of the left kidney. Once we confirmed location of the needle tip, 2 separate 16 gauge core biopsy were achieved. Two Gel-Foam pledgets were infused with a small amount of saline. The needle was removed. Final images were stored. The patient tolerated the procedure well and remained hemodynamically stable throughout. No complications were encountered and no significant blood loss encountered. IMPRESSION: Status post ultrasound-guided medical renal biopsy, left kidney. Signed, Dulcy Fanny. Dellia Nims, RPVI [facility] Radiology Electronically Signed   By: Corrie Mckusick D.O.   On: 09/10/2019 10:46   Vas [place] Upper Ext Vein Mapping (pre-op Avf)  Result Date: 09/11/2019 UPPER EXTREMITY VEIN MAPPING  Indications: Pre-access. Comparison Study: NO PRIOR Performing Technologist: Abram Sander RVS  Examination Guidelines: A complete evaluation includes B-mode imaging, spectral Doppler, color Doppler, and power Doppler as needed of all accessible portions of each vessel. Bilateral testing is considered an integral part  of a complete examination. Limited examinations for reoccurring indications may be performed as noted. +-----------------+-------------+----------+------------------------+ Right Cephalic   Diameter (cm)Depth (cm)        Findings         +-----------------+-------------+----------+------------------------+ Shoulder             0.22        0.58                            +-----------------+-------------+----------+------------------------+ Prox upper arm       0.19        0.49                            +-----------------+-------------+----------+------------------------+ Mid upper arm        0.21        0.32                            +-----------------+-------------+----------+------------------------+ Dist upper arm       0.32        0.28                            +-----------------+-------------+----------+------------------------+  Antecubital fossa    0.34        0.28                            +-----------------+-------------+----------+------------------------+ Prox forearm                            not visualized due to iv +-----------------+-------------+----------+------------------------+ Mid forearm          0.22        0.29                            +-----------------+-------------+----------+------------------------+ Dist forearm         0.15        0.23                            +-----------------+-------------+----------+------------------------+ +-----------------+-------------+----------+--------------+ Right Basilic    Diameter (cm)Depth (cm)   Findings    +-----------------+-------------+----------+--------------+ Prox upper arm       0.81        0.72                  +-----------------+-------------+----------+--------------+ Mid upper arm        0.51        0.86                  +-----------------+-------------+----------+--------------+ Dist upper arm       0.45        0.46                   +-----------------+-------------+----------+--------------+ Antecubital fossa    0.48        0.56                  +-----------------+-------------+----------+--------------+ Prox forearm         0.17        0.34     branching    +-----------------+-------------+----------+--------------+ Mid forearm          0.16        0.16     branching    +-----------------+-------------+----------+--------------+ Distal forearm                          not visualized +-----------------+-------------+----------+--------------+ Elbow                                   not visualized +-----------------+-------------+----------+--------------+ +-----------------+-------------+----------+--------+ Left Cephalic    Diameter (cm)Depth (cm)Findings +-----------------+-------------+----------+--------+ Shoulder             0.17        0.54            +-----------------+-------------+----------+--------+ Prox upper arm       0.19        0.39            +-----------------+-------------+----------+--------+ Mid upper arm        0.12        0.31            +-----------------+-------------+----------+--------+ Dist upper arm       0.16        0.25            +-----------------+-------------+----------+--------+ Antecubital fossa    0.22  0.35            +-----------------+-------------+----------+--------+ Prox forearm         0.17        0.49            +-----------------+-------------+----------+--------+ Mid forearm          0.13        0.22            +-----------------+-------------+----------+--------+ Dist forearm         0.11        0.34            +-----------------+-------------+----------+--------+ +-----------------+-------------+----------+--------------+ Left Basilic     Diameter (cm)Depth (cm)   Findings    +-----------------+-------------+----------+--------------+ Prox upper arm       0.27        0.64                   +-----------------+-------------+----------+--------------+ Mid upper arm        0.20        0.80                  +-----------------+-------------+----------+--------------+ Dist upper arm       0.20        0.67     branching    +-----------------+-------------+----------+--------------+ Antecubital fossa                       not visualized +-----------------+-------------+----------+--------------+ Prox forearm                            not visualized +-----------------+-------------+----------+--------------+ Mid forearm                             not visualized +-----------------+-------------+----------+--------------+ Distal forearm                          not visualized +-----------------+-------------+----------+--------------+ *See table(s) above for measurements and observations.  Diagnosing physician: [name] MD Electronically signed by [name] MD on 09/11/2019 at 1:00:54 PM.    Final     (Echo, Carotid, EGD, Colonoscopy, ERCP)    Subjective:   Discharge Exam: Vitals:   09/14/19 1700 09/14/19 1716  BP: 126/69 110/68  Pulse: 85 85  Resp:    Temp:    SpO2: 99% 100%   Vitals:   09/14/19 1640 09/14/19 1645 09/14/19 1700 09/14/19 1716  BP: 125/71 124/68 126/69 110/68  Pulse: 85 85 85 85  Resp:      Temp:      TempSrc:      SpO2: 98% 99% 99% 100%  Weight:      Height:        General: Pt is alert, awake, not in acute distress Cardiovascular: RRR, S1/S2 +, no rubs, no gallops Respiratory: CTA bilaterally, no wheezing, no rhonchi Abdominal: Soft, NT, ND, bowel sounds + Extremities: no edema, no cyanosis    The results of significant diagnostics from this hospitalization (including imaging, microbiology, ancillary and laboratory) are listed below for reference.     Microbiology: Recent Results (from the past 240 hour(s))  SARS Coronavirus 2 [facility] order, Performed in [facility] hospital lab) Nasopharyngeal Nasopharyngeal Swab      Status: None   Collection Time: 09/08/19  9:33 PM   Specimen: Nasopharyngeal Swab  Result Value Ref Range Status   SARS Coronavirus 2 NEGATIVE  NEGATIVE Final    Comment: (NOTE) If result is NEGATIVE SARS-CoV-2 target nucleic acids are NOT DETECTED. The SARS-CoV-2 RNA is generally detectable in upper and lower  respiratory specimens during the acute phase of infection. The lowest  concentration of SARS-CoV-2 viral copies this assay can detect is 250  copies / mL. A negative result does not preclude SARS-CoV-2 infection  and should not be used as the sole basis for treatment or other  patient management decisions.  A negative result may occur with  improper specimen collection / handling, submission of specimen other  than nasopharyngeal swab, presence of viral mutation(s) within the  areas targeted by this assay, and inadequate number of viral copies  (<250 copies / mL). A negative result must be combined with clinical  observations, patient history, and epidemiological information. If result is POSITIVE SARS-CoV-2 target nucleic acids are DETECTED. The SARS-CoV-2 RNA is generally detectable in upper and lower  respiratory specimens dur ing the acute phase of infection.  Positive  results are indicative of active infection with SARS-CoV-2.  Clinical  correlation with patient history and other diagnostic information is  necessary to determine patient infection status.  Positive results do  not rule out bacterial infection or co-infection with other viruses. If result is PRESUMPTIVE POSTIVE SARS-CoV-2 nucleic acids MAY BE PRESENT.   A presumptive positive result was obtained on the submitted specimen  and confirmed on repeat testing.  While 2019 novel coronavirus  (SARS-CoV-2) nucleic acids may be present in the submitted sample  additional confirmatory testing may be necessary for epidemiological  and / or clinical management purposes  to differentiate between  SARS-CoV-2 and  other Sarbecovirus currently known to infect humans.  If clinically indicated additional testing with an alternate test  methodology [phone]) is advised. The SARS-CoV-2 RNA is generally  detectable in upper and lower respiratory sp ecimens during the acute  phase of infection. The expected result is Negative. Fact Sheet for Patients:  StrictlyIdeas.no Fact Sheet for Healthcare Providers: BankingDealers.co.za This test is not yet approved or cleared by the [place] FDA and has been authorized for detection and/or diagnosis of SARS-CoV-2 by FDA under an Emergency Use Authorization (EUA).  This EUA will remain in effect (meaning this test can be used) for the duration of the COVID-19 declaration under Section 564(b)(1) of the Act, 21 U.S.C. section 360bbb-3(b)(1), unless the authorization is terminated or revoked sooner. Performed at [facility], [place] [address], [place], [place] [address]      Labs: BNP (last 3 results) Recent Labs    09/08/19 1710  BNP 622.2*   Basic Metabolic Panel: Recent Labs  Lab 09/08/19 2130  09/10/19 0340 09/11/19 0340 09/12/19 0243 09/13/19 1022 09/14/19 0125  NA 138   < > 139 141 139 137 139  K 2.9*   < > 3.3* 3.7 4.1 3.6 3.5  CL 100   < > 105 107 105 103 104  CO2 23   < > 24 24 22 22 23   GLUCOSE 169*   < > 185* 218* 217* 290* 202*  BUN 58*   < > 64* 69* 73* 78* 77*  CREATININE 6.86*   < > 6.85* 7.07* 6.92* 7.03* 7.27*  CALCIUM 7.6*   < > 7.4* 7.5* 7.6* 7.6* 7.7*  MG 1.7  --   --   --   --   --   --   PHOS 6.8*  --   --   --   --   --   --    < > =  values in this interval not displayed.   Liver Function Tests: Recent Labs  Lab 09/08/19 1710 09/08/19 2130  AST 12*  --   ALT 11  --   ALKPHOS 106  --   BILITOT 0.2*  --   PROT 4.9*  --   ALBUMIN 2.4* 2.3*   No results for input(s): LIPASE, AMYLASE in the last 168 hours. No results for input(s): AMMONIA in the last 168  hours. CBC: Recent Labs  Lab 09/08/19 1710  09/10/19 0340 09/11/19 0340 09/12/19 0243 09/13/19 0438 09/14/19 0125  WBC 9.5  --  6.8 8.1 7.3 6.3 6.1  NEUTROABS 5.8  --  3.7  --   --   --   --   HGB 9.2*   < > 7.6* 7.2* 7.4* 7.0* 8.3*  HCT 26.3*   < > 22.2* 21.4* 22.0* 20.5* 23.4*  MCV 87.1  --  88.1 89.5 88.7 88.7 87.3  PLT 243  --  185 182 194 193 169   < > = values in this interval not displayed.   Cardiac Enzymes: No results for input(s): CKTOTAL, CKMB, CKMBINDEX, TROPONINI in the last 168 hours. BNP: Invalid input(s): POCBNP CBG: Recent Labs  Lab 09/13/19 1728 09/13/19 2149 09/14/19 0808 09/14/19 1036 09/14/19 1433  GLUCAP 316* 72 182* 123* 137*   D-Dimer No results for input(s): DDIMER in the last 72 hours. Hgb A1c No results for input(s): HGBA1C in the last 72 hours. Lipid Profile No results for input(s): CHOL, HDL, LDLCALC, TRIG, CHOLHDL, LDLDIRECT in the last 72 hours. Thyroid function studies No results for input(s): TSH, T4TOTAL, T3FREE, THYROIDAB in the last 72 hours.  Invalid input(s): FREET3 Anemia work up No results for input(s): VITAMINB12, FOLATE, FERRITIN, TIBC, IRON, RETICCTPCT in the last 72 hours. Urinalysis    Component Value Date/Time   COLORURINE YELLOW 09/09/2019 [place] 09/09/2019 0343   LABSPEC 1.009 09/09/2019 0343   PHURINE 5.0 09/09/2019 0343   GLUCOSEU 50 (A) 09/09/2019 0343   HGBUR MODERATE (A) 09/09/2019 0343   BILIRUBINUR NEGATIVE 09/09/2019 0343   KETONESUR NEGATIVE 09/09/2019 0343   PROTEINUR >=300 (A) 09/09/2019 0343   NITRITE NEGATIVE 09/09/2019 0343   LEUKOCYTESUR NEGATIVE 09/09/2019 0343   Sepsis Labs Invalid input(s): PROCALCITONIN,  WBC,  LACTICIDVEN Microbiology Recent Results (from the past 240 hour(s))  SARS Coronavirus 2 [facility] order, Performed in [facility] hospital lab) Nasopharyngeal Nasopharyngeal Swab     Status: None   Collection Time: 09/08/19  9:33 PM   Specimen: Nasopharyngeal  Swab  Result Value Ref Range Status   SARS Coronavirus 2 NEGATIVE NEGATIVE Final    Comment: (NOTE) If result is NEGATIVE SARS-CoV-2 target nucleic acids are NOT DETECTED. The SARS-CoV-2 RNA is generally detectable in upper and lower  respiratory specimens during the acute phase of infection. The lowest  concentration of SARS-CoV-2 viral copies this assay can detect is 250  copies / mL. A negative result does not preclude SARS-CoV-2 infection  and should not be used as the sole basis for treatment or other  patient management decisions.  A negative result may occur with  improper specimen collection / handling, submission of specimen other  than nasopharyngeal swab, presence of viral mutation(s) within the  areas targeted by this assay, and inadequate number of viral copies  (<250 copies / mL). A negative result must be combined with clinical  observations, patient history, and epidemiological information. If result is POSITIVE SARS-CoV-2 target nucleic acids are DETECTED. The SARS-CoV-2 RNA  is generally detectable in upper and lower  respiratory specimens dur ing the acute phase of infection.  Positive  results are indicative of active infection with SARS-CoV-2.  Clinical  correlation with patient history and other diagnostic information is  necessary to determine patient infection status.  Positive results do  not rule out bacterial infection or co-infection with other viruses. If result is PRESUMPTIVE POSTIVE SARS-CoV-2 nucleic acids MAY BE PRESENT.   A presumptive positive result was obtained on the submitted specimen  and confirmed on repeat testing.  While 2019 novel coronavirus  (SARS-CoV-2) nucleic acids may be present in the submitted sample  additional confirmatory testing may be necessary for epidemiological  and / or clinical management purposes  to differentiate between  SARS-CoV-2 and other Sarbecovirus currently known to infect humans.  If clinically indicated  additional testing with an alternate test  methodology [phone]) is advised. The SARS-CoV-2 RNA is generally  detectable in upper and lower respiratory sp ecimens during the acute  phase of infection. The expected result is Negative. Fact Sheet for Patients:  StrictlyIdeas.no Fact Sheet for Healthcare Providers: BankingDealers.co.za This test is not yet approved or cleared by the [place] FDA and has been authorized for detection and/or diagnosis of SARS-CoV-2 by FDA under an Emergency Use Authorization (EUA).  This EUA will remain in effect (meaning this test can be used) for the duration of the COVID-19 declaration under Section 564(b)(1) of the Act, 21 U.S.C. section 360bbb-3(b)(1), unless the authorization is terminated or revoked sooner. Performed at [facility], [place] [address], [place], [place] [address]      Time coordinating discharge: 32 minutes  SIGNED:   Hosie Poisson, MD  [facility] 09/14/2019, 7:25 PM Pager   If 7PM-7AM, please contact night-coverage [email or web address] Password [email or web address]

## 2019-09-14 NOTE — Anesthesia Procedure Notes (Signed)
Procedure Name: MAC Date/Time: 09/14/2019 11:45 AM Performed by: Inda Coke, CRNA Pre-anesthesia Checklist: Patient identified, Emergency Drugs available, Suction available, Timeout performed and Patient being monitored Patient Re-evaluated:Patient Re-evaluated prior to induction Oxygen Delivery Method: Simple face mask Induction Type: IV induction Dental Injury: Teeth and Oropharynx as per pre-operative assessment

## 2019-09-14 NOTE — Progress Notes (Signed)
[facility]    NEPHROLOGY PROGRESS NOTE  SUBJECTIVE: s/p AVF today with Dr. Scot Dock.  Eager to go home.  UOP decent but on high-dose diuretics.  On RA.     OBJECTIVE:  Vitals:   09/14/19 1432 09/14/19 1517  BP: 122/66 126/68  Pulse: 84 84  Resp: 16 16  Temp:    SpO2: 95% 95%    Intake/Output Summary (Last 24 hours) at 09/14/2019 1629 Last data filed at 09/14/2019 1600 Gross per 24 hour  Intake 1475.17 ml  Output 620 ml  Net 855.17 ml      General:  AAOx3 NAD Neck:  No JVD,  CV:  Heart RRR, no m/r/g  Lungs:  Clear bilaterally no c/w/r Abd:  NABS, nondistended.   Extremities: +2 bilateral lower extremity edema Skin:  No skin rash  MEDICATIONS:  . sodium chloride   Intravenous Once  . bumetanide  3 mg Oral TID  . ferrous sulfate  325 mg Oral Q breakfast  . heparin  5,000 Units Subcutaneous Q8H  . insulin aspart  0-20 Units Subcutaneous TID WC  . insulin aspart  0-5 Units Subcutaneous QHS  . loperamide  4 mg Oral Once  . metolazone  5 mg Oral Daily  . nicotine  21 mg Transdermal Daily  . senna-docusate  2 tablet Oral BID       LABS:   CBC Latest Ref Rng & Units 09/14/2019 09/13/2019 09/12/2019  WBC 4.0 - 10.5 K/uL 6.1 6.3 7.3  Hemoglobin 13.0 - 17.0 g/dL 8.3(L) 7.0(L) 7.4(L)  Hematocrit 39.0 - 52.0 % 23.4(L) 20.5(L) 22.0(L)  Platelets 150 - 400 K/uL 169 193 194    CMP Latest Ref Rng & Units 09/14/2019 09/13/2019 09/12/2019  Glucose 70 - 99 mg/dL 202(H) 290(H) 217(H)  BUN 8 - 23 mg/dL 77(H) 78(H) 73(H)  Creatinine 0.61 - 1.24 mg/dL 7.27(H) 7.03(H) 6.92(H)  Sodium 135 - 145 mmol/L 139 137 139  Potassium 3.5 - 5.1 mmol/L 3.5 3.6 4.1  Chloride 98 - 111 mmol/L 104 103 105  CO2 22 - 32 mmol/L 23 22 22   Calcium 8.9 - 10.3 mg/dL 7.7(L) 7.6(L) 7.6(L)  Total Protein 6.5 - 8.1 g/dL - - -  Total Bilirubin 0.3 - 1.2 mg/dL - - -  Alkaline Phos 38 - 126 U/L - - -  AST 15 - 41 U/L - - -  ALT 0 - 44 U/L - - -    Lab Results  Component Value Date   CALCIUM 7.7 (L) 09/14/2019   CAION 0.98 (L) 09/08/2019   PHOS 6.8 (H) 09/08/2019       Component Value Date/Time   COLORURINE YELLOW 09/09/2019 0343   APPEARANCEUR CLEAR 09/09/2019 0343   LABSPEC 1.009 09/09/2019 0343   PHURINE 5.0 09/09/2019 0343   GLUCOSEU 50 (A) 09/09/2019 0343   HGBUR MODERATE (A) 09/09/2019 0343   BILIRUBINUR NEGATIVE 09/09/2019 0343   KETONESUR NEGATIVE 09/09/2019 0343   PROTEINUR >=300 (A) 09/09/2019 0343   NITRITE NEGATIVE 09/09/2019 0343   LEUKOCYTESUR NEGATIVE 09/09/2019 0343      Component Value Date/Time   TCO2 24 09/08/2019 2140       Component Value Date/Time   IRON 41 (L) 09/10/2019 1044   TIBC 153 (L) 09/10/2019 1044   FERRITIN 128 09/10/2019 1044   IRONPCTSAT 27 09/10/2019 1044       ASSESSMENT/PLAN:     14M with progressive renal failure and nephrotic proteinuria.  Also with some degree of hematuria.  This could be diabetic  nephropathy, in fact it probably is.  However, given the rapid progression, renal biopsy performed on 09/10/2019.  1.  Acute renal failure--> progression of CKD.  Continues to have rapid progression of underlying renal disease.  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 Serologies negative with the exception of a low C4.  Creatinine stable around 7.  No indication for HD at present- has a fistula as of today and is eager to go home.  Discussed that he is requiring high-dose diuretics and fluid/ salt restriction is key.  He will need close f/u with Dr. Johnney Ou within one week- have d/w pt and I will help arrange appt.    2.  Hypervolemia. Recommend bumex 3 TID and metolazone daily on d/c  3.  Hypertension.  Overall stable.  4.  Diabetes mellitus.  Continue tight diabetes control.  Suspect this is the etiology of his worsening renal function.    5.  Anemia of chronic kidney disease.  Continue oral iron and aranesp.  Monitor hemoglobin.

## 2019-09-14 NOTE — Op Note (Signed)
    NAME: Duane Hall    MRN: [id number] DOB: [birth year]    DATE OF OPERATION: 09/14/2019  PREOP DIAGNOSIS:    Stage V chronic kidney disease  POSTOP DIAGNOSIS:    Same  PROCEDURE:    Right basilic vein transposition  SURGEON: Judeth Cornfield. Scot Dock, MD, FACS  ASSIST: Laurence Slate, PA  ANESTHESIA: Local with sedation  EBL: Minimal  INDICATIONS:    Duane Hall is a 63 y.o. male with stage V chronic kidney disease.  He is not yet on dialysis.  FINDINGS:   4 mm basilic vein  TECHNIQUE:   The right arm was prepped and draped in usual sterile fashion.  I had looked at the basilic vein myself with the SonoSite and appeared to be reasonable in size.  Using 4 incisions along the medial aspect of the right arm the basilic vein was harvested from the antecubital level to the axilla.  Branches were divided between clips and 3-0 silk ties.  The nerve was protected.  To the distal incision the brachial artery was dissected free.  The vein was ligated distally and then irrigated with heparinized saline and marked to prevent twisting.  H tunnel was created from the antecubital incision to the axillary incision and the vein was brought to the tunnel being careful to prevent any twisting.  The patient was heparinized.  The brachial artery was clamped proximally distally and a longitudinal arteriotomy was made.  The vein was spatulated and sewn end-to-side to the artery using continuous 6-0 Prolene suture.  At the completion was an excellent thrill in the fistula.  There was a palpable radial pulse.  Hemostasis was obtained in the wounds.  Each of the wounds was closed with a deep layer of 3-0 Vicryl and the skin closed with 4-0 Vicryl.  Dermabond was applied.  The patient tolerated the procedure well was transferred to the recovery room in stable condition.  All needle and sponge counts were correct.  Deitra Mayo, MD, FACS [facility]  DATE OF  DICTATION:   09/14/2019

## 2019-09-15 ENCOUNTER — Encounter (HOSPITAL_COMMUNITY): Payer: Self-pay | Admitting: Vascular Surgery

## 2019-09-15 NOTE — Anesthesia Postprocedure Evaluation (Signed)
Anesthesia Post Note  Patient: Duane Hall [facility]  Procedure(s) Performed: Right Arm Basilic Vein transposition (Right Arm Upper)     Patient location during evaluation: PACU Anesthesia Type: MAC Level of consciousness: awake and alert Pain management: pain level controlled Vital Signs Assessment: post-procedure vital signs reviewed and stable Respiratory status: spontaneous breathing, nonlabored ventilation, respiratory function stable and patient connected to nasal cannula oxygen Cardiovascular status: stable and blood pressure returned to baseline Postop Assessment: no apparent nausea or vomiting Anesthetic complications: no    Last Vitals:  Vitals:   09/14/19 1700 09/14/19 1716  BP: 126/69 110/68  Pulse: 85 85  Resp:    Temp:    SpO2: 99% 100%    Last Pain:  Vitals:   09/14/19 1517  TempSrc:   PainSc: 0-No pain                 Tiajuana Amass

## 2019-09-21 DIAGNOSIS — E1129 Type 2 diabetes mellitus with other diabetic kidney complication: Secondary | ICD-10-CM | POA: Diagnosis not present

## 2019-09-21 DIAGNOSIS — R809 Proteinuria, unspecified: Secondary | ICD-10-CM | POA: Diagnosis not present

## 2019-09-21 DIAGNOSIS — N183 Chronic kidney disease, stage 3 (moderate): Secondary | ICD-10-CM | POA: Diagnosis not present

## 2019-10-01 DIAGNOSIS — N184 Chronic kidney disease, stage 4 (severe): Secondary | ICD-10-CM | POA: Diagnosis not present

## 2019-10-04 DIAGNOSIS — Z992 Dependence on renal dialysis: Secondary | ICD-10-CM | POA: Diagnosis not present

## 2019-10-04 DIAGNOSIS — N186 End stage renal disease: Secondary | ICD-10-CM | POA: Diagnosis not present

## 2019-10-05 DIAGNOSIS — N186 End stage renal disease: Secondary | ICD-10-CM | POA: Insufficient documentation

## 2019-10-05 DIAGNOSIS — D689 Coagulation defect, unspecified: Secondary | ICD-10-CM | POA: Insufficient documentation

## 2019-10-06 DIAGNOSIS — Z23 Encounter for immunization: Secondary | ICD-10-CM | POA: Insufficient documentation

## 2019-10-07 DIAGNOSIS — H409 Unspecified glaucoma: Secondary | ICD-10-CM | POA: Insufficient documentation

## 2019-10-07 DIAGNOSIS — E785 Hyperlipidemia, unspecified: Secondary | ICD-10-CM | POA: Insufficient documentation

## 2019-10-07 DIAGNOSIS — Z720 Tobacco use: Secondary | ICD-10-CM | POA: Insufficient documentation

## 2019-10-09 DIAGNOSIS — T829XXA Unspecified complication of cardiac and vascular prosthetic device, implant and graft, initial encounter: Secondary | ICD-10-CM | POA: Insufficient documentation

## 2019-10-14 DIAGNOSIS — N2581 Secondary hyperparathyroidism of renal origin: Secondary | ICD-10-CM | POA: Insufficient documentation

## 2019-10-16 DIAGNOSIS — E1151 Type 2 diabetes mellitus with diabetic peripheral angiopathy without gangrene: Secondary | ICD-10-CM | POA: Insufficient documentation

## 2019-10-18 DIAGNOSIS — T7840XA Allergy, unspecified, initial encounter: Secondary | ICD-10-CM | POA: Insufficient documentation

## 2019-10-25 ENCOUNTER — Other Ambulatory Visit: Payer: Self-pay

## 2019-10-25 DIAGNOSIS — N186 End stage renal disease: Secondary | ICD-10-CM

## 2019-10-25 DIAGNOSIS — Z992 Dependence on renal dialysis: Secondary | ICD-10-CM

## 2019-10-26 ENCOUNTER — Telehealth (HOSPITAL_COMMUNITY): Payer: Self-pay | Admitting: *Deleted

## 2019-10-26 NOTE — Telephone Encounter (Signed)

## 2019-10-27 ENCOUNTER — Ambulatory Visit (HOSPITAL_COMMUNITY)
Admission: RE | Admit: 2019-10-27 | Discharge: 2019-10-27 | Disposition: A | Discharge status: Home / Self Care | Payer: BC Managed Care – PPO | Source: Ambulatory Visit | Attending: Family | Admitting: Family

## 2019-10-27 ENCOUNTER — Ambulatory Visit (INDEPENDENT_AMBULATORY_CARE_PROVIDER_SITE_OTHER): Payer: Self-pay | Admitting: Physician Assistant

## 2019-10-27 ENCOUNTER — Other Ambulatory Visit: Payer: Self-pay

## 2019-10-27 VITALS — BP 86/51 | HR 88 | Resp 16 | Ht 69.0 in | Wt 150.0 lb

## 2019-10-27 DIAGNOSIS — N186 End stage renal disease: Secondary | ICD-10-CM | POA: Diagnosis not present

## 2019-10-27 DIAGNOSIS — Z992 Dependence on renal dialysis: Secondary | ICD-10-CM

## 2019-10-27 NOTE — Progress Notes (Signed)
POST OPERATIVE OFFICE NOTE    CC:  F/u for surgery  HPI:  This is a 63 y.o. male who is s/p Right basilic vein transposition  No Known Allergies  Current Outpatient Medications  Medication Sig Dispense Refill  . aspirin EC 81 MG tablet Take 81 mg by mouth as needed for mild pain.    . bumetanide (BUMEX) 1 MG tablet Take 3 tablets (3 mg total) by mouth 3 (three) times daily. 90 tablet 0  . ferrous sulfate 325 (65 FE) MG tablet Take 1 tablet (325 mg total) by mouth daily with breakfast. 30 tablet 3  . glipiZIDE (GLUCOTROL XL) 5 MG 24 hr tablet Take 1 tablet (5 mg total) by mouth daily with breakfast. 30 tablet 0  . metolazone (ZAROXOLYN) 5 MG tablet Take 1 tablet (5 mg total) by mouth daily. 30 tablet 1  . nicotine (NICODERM CQ - DOSED IN MG/24 HOURS) 21 mg/24hr patch Place 1 patch (21 mg total) onto the skin daily. 28 patch 0  . omeprazole (PRILOSEC) 40 MG capsule Take 40 mg by mouth daily.    Marland Kitchen senna-docusate (SENOKOT-S) 8.6-50 MG tablet Take 2 tablets by mouth 2 (two) times daily. 30 tablet 0  . Vitamin D, Ergocalciferol, (DRISDOL) 1.25 MG (50000 UT) CAPS capsule Take 1 capsule by mouth once a week. Sundays     No current facility-administered medications for this visit.      ROS:  See HPI  Physical Exam:  +------------+----------+-------------+----------+------------------+ OUTFLOW VEINPSV (cm/s)Diameter (cm)Depth (cm)     Describe      +------------+----------+-------------+----------+------------------+ Shoulder       194        1.16        0.78                      +------------+----------+-------------+----------+------------------+ Prox UA        512        0.63        0.30   change in Diameter +------------+----------+-------------+----------+------------------+ Mid UA         223        0.59        0.29                      +------------+----------+-------------+----------+------------------+ Dist UA        566        0.68        0.50    change in Diameter +------------+----------+-------------+----------+------------------+ AC Fossa       28 2        0.57        0.90                      +------------+----------+-------------+----------+------------------+       Summary: Patent arteriovenous fistula with mild changes in vessel diameter observed in the proximal upper arm and distal upper arm of the basilic outflow vein. Upper extremity edema noted.  Incision:  Well healed incisions Extremities:  Palpable thrill throughout the fistula, sensation intact and grip 5/5   Assessment/Plan:  This is a 63 y.o. male who is s/p: Right basilic vein transposition  The fistula is maturing well with diameter> 0.59 and it is < 0.50 in depth after transposition.  The fistula was created 09/14/2019 and will be accessible  in 12 weeks on Dec. 15/2020.  He will be scheduled for [facility] removal once fistula is functioning well.  F/U PRN.  Roxy Horseman PA-C [facility] [phone]  Clinic MD:  Scot Dock

## 2019-10-30 DIAGNOSIS — E1129 Type 2 diabetes mellitus with other diabetic kidney complication: Secondary | ICD-10-CM | POA: Diagnosis not present

## 2019-10-30 DIAGNOSIS — N186 End stage renal disease: Secondary | ICD-10-CM | POA: Diagnosis not present

## 2019-10-30 DIAGNOSIS — Z992 Dependence on renal dialysis: Secondary | ICD-10-CM | POA: Diagnosis not present

## 2019-11-17 DIAGNOSIS — D509 Iron deficiency anemia, unspecified: Secondary | ICD-10-CM | POA: Insufficient documentation

## 2019-11-29 DIAGNOSIS — Z992 Dependence on renal dialysis: Secondary | ICD-10-CM | POA: Diagnosis not present

## 2019-11-29 DIAGNOSIS — N186 End stage renal disease: Secondary | ICD-10-CM | POA: Diagnosis not present

## 2019-11-29 DIAGNOSIS — E1129 Type 2 diabetes mellitus with other diabetic kidney complication: Secondary | ICD-10-CM | POA: Diagnosis not present

## 2019-12-16 DIAGNOSIS — E1143 Type 2 diabetes mellitus with diabetic autonomic (poly)neuropathy: Secondary | ICD-10-CM | POA: Diagnosis not present

## 2019-12-16 DIAGNOSIS — Z8619 Personal history of other infectious and parasitic diseases: Secondary | ICD-10-CM | POA: Diagnosis not present

## 2019-12-16 DIAGNOSIS — Z452 Encounter for adjustment and management of vascular access device: Secondary | ICD-10-CM | POA: Diagnosis not present

## 2019-12-16 DIAGNOSIS — Z1159 Encounter for screening for other viral diseases: Secondary | ICD-10-CM | POA: Diagnosis not present

## 2019-12-16 DIAGNOSIS — Z125 Encounter for screening for malignant neoplasm of prostate: Secondary | ICD-10-CM | POA: Diagnosis not present

## 2019-12-16 DIAGNOSIS — R634 Abnormal weight loss: Secondary | ICD-10-CM | POA: Diagnosis not present

## 2019-12-16 DIAGNOSIS — E1121 Type 2 diabetes mellitus with diabetic nephropathy: Secondary | ICD-10-CM | POA: Diagnosis not present

## 2019-12-16 DIAGNOSIS — Z114 Encounter for screening for human immunodeficiency virus [HIV]: Secondary | ICD-10-CM | POA: Diagnosis not present

## 2019-12-16 DIAGNOSIS — Z8701 Personal history of pneumonia (recurrent): Secondary | ICD-10-CM | POA: Diagnosis not present

## 2019-12-16 DIAGNOSIS — R5383 Other fatigue: Secondary | ICD-10-CM | POA: Diagnosis not present

## 2019-12-16 DIAGNOSIS — Z01818 Encounter for other preprocedural examination: Secondary | ICD-10-CM | POA: Diagnosis not present

## 2019-12-16 DIAGNOSIS — R9431 Abnormal electrocardiogram [ECG] [EKG]: Secondary | ICD-10-CM | POA: Diagnosis not present

## 2019-12-16 DIAGNOSIS — Z7682 Awaiting organ transplant status: Secondary | ICD-10-CM | POA: Diagnosis not present

## 2019-12-16 DIAGNOSIS — E46 Unspecified protein-calorie malnutrition: Secondary | ICD-10-CM | POA: Diagnosis not present

## 2019-12-16 DIAGNOSIS — Z992 Dependence on renal dialysis: Secondary | ICD-10-CM | POA: Diagnosis not present

## 2019-12-16 DIAGNOSIS — R600 Localized edema: Secondary | ICD-10-CM | POA: Diagnosis not present

## 2019-12-16 DIAGNOSIS — E1122 Type 2 diabetes mellitus with diabetic chronic kidney disease: Secondary | ICD-10-CM | POA: Diagnosis not present

## 2019-12-16 DIAGNOSIS — N186 End stage renal disease: Secondary | ICD-10-CM | POA: Diagnosis not present

## 2019-12-16 DIAGNOSIS — Z833 Family history of diabetes mellitus: Secondary | ICD-10-CM | POA: Diagnosis not present

## 2019-12-20 DIAGNOSIS — E162 Hypoglycemia, unspecified: Secondary | ICD-10-CM | POA: Insufficient documentation

## 2019-12-30 DIAGNOSIS — N186 End stage renal disease: Secondary | ICD-10-CM | POA: Diagnosis not present

## 2019-12-30 DIAGNOSIS — E1129 Type 2 diabetes mellitus with other diabetic kidney complication: Secondary | ICD-10-CM | POA: Diagnosis not present

## 2019-12-30 DIAGNOSIS — Z992 Dependence on renal dialysis: Secondary | ICD-10-CM | POA: Diagnosis not present

## 2020-01-25 DIAGNOSIS — T82898A Other specified complication of vascular prosthetic devices, implants and grafts, initial encounter: Secondary | ICD-10-CM | POA: Diagnosis not present

## 2020-01-25 DIAGNOSIS — N186 End stage renal disease: Secondary | ICD-10-CM | POA: Diagnosis not present

## 2020-01-25 DIAGNOSIS — I871 Compression of vein: Secondary | ICD-10-CM | POA: Diagnosis not present

## 2020-01-25 DIAGNOSIS — Z992 Dependence on renal dialysis: Secondary | ICD-10-CM | POA: Diagnosis not present

## 2020-01-27 DIAGNOSIS — F54 Psychological and behavioral factors associated with disorders or diseases classified elsewhere: Secondary | ICD-10-CM | POA: Diagnosis not present

## 2020-01-27 DIAGNOSIS — Z7682 Awaiting organ transplant status: Secondary | ICD-10-CM | POA: Diagnosis not present

## 2020-01-27 DIAGNOSIS — F17200 Nicotine dependence, unspecified, uncomplicated: Secondary | ICD-10-CM | POA: Diagnosis not present

## 2020-01-27 DIAGNOSIS — Z008 Encounter for other general examination: Secondary | ICD-10-CM | POA: Diagnosis not present

## 2020-01-30 DIAGNOSIS — E1129 Type 2 diabetes mellitus with other diabetic kidney complication: Secondary | ICD-10-CM | POA: Diagnosis not present

## 2020-01-30 DIAGNOSIS — Z992 Dependence on renal dialysis: Secondary | ICD-10-CM | POA: Diagnosis not present

## 2020-01-30 DIAGNOSIS — N186 End stage renal disease: Secondary | ICD-10-CM | POA: Diagnosis not present

## 2020-02-08 DIAGNOSIS — Z452 Encounter for adjustment and management of vascular access device: Secondary | ICD-10-CM | POA: Diagnosis not present

## 2020-02-15 ENCOUNTER — Other Ambulatory Visit: Payer: Self-pay | Admitting: Physician Assistant

## 2020-02-15 DIAGNOSIS — R911 Solitary pulmonary nodule: Secondary | ICD-10-CM

## 2020-02-15 DIAGNOSIS — Z1211 Encounter for screening for malignant neoplasm of colon: Secondary | ICD-10-CM | POA: Diagnosis not present

## 2020-02-15 DIAGNOSIS — K2210 Ulcer of esophagus without bleeding: Secondary | ICD-10-CM | POA: Diagnosis not present

## 2020-02-15 DIAGNOSIS — E1143 Type 2 diabetes mellitus with diabetic autonomic (poly)neuropathy: Secondary | ICD-10-CM | POA: Diagnosis not present

## 2020-02-15 DIAGNOSIS — K3184 Gastroparesis: Secondary | ICD-10-CM | POA: Diagnosis not present

## 2020-02-21 ENCOUNTER — Other Ambulatory Visit: Payer: BC Managed Care – PPO

## 2020-02-24 ENCOUNTER — Ambulatory Visit
Admission: RE | Admit: 2020-02-24 | Discharge: 2020-02-24 | Disposition: A | Discharge status: Home / Self Care | Payer: BC Managed Care – PPO | Source: Ambulatory Visit | Attending: Physician Assistant | Admitting: Physician Assistant

## 2020-02-24 DIAGNOSIS — R911 Solitary pulmonary nodule: Secondary | ICD-10-CM

## 2020-02-24 IMAGING — CT CT CHEST W/O CM
2 of 4 series · 13 of 36 positions shown, 16 images · non-contrast
Comparison: Chest CT [DATE]; chest CT [DATE]

CLINICAL DATA: Follow-up pulmonary nodule.

EXAM:
CT CHEST WITHOUT CONTRAST
TECHNIQUE: Multidetector CT imaging of the chest was performed following the
standard protocol without IV contrast.

[Series 2: chest 2.00 br40 s3 · axial · 0.57mm/px · z∈[+1525,+1815]mm · 10 of 173 slices shown, 13 images (1 of 2)]
[im 14/173  mediastinal]
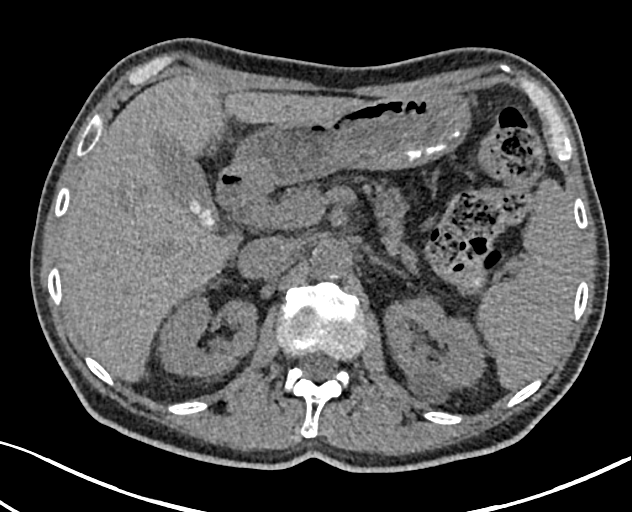
[im 14/173  lung]
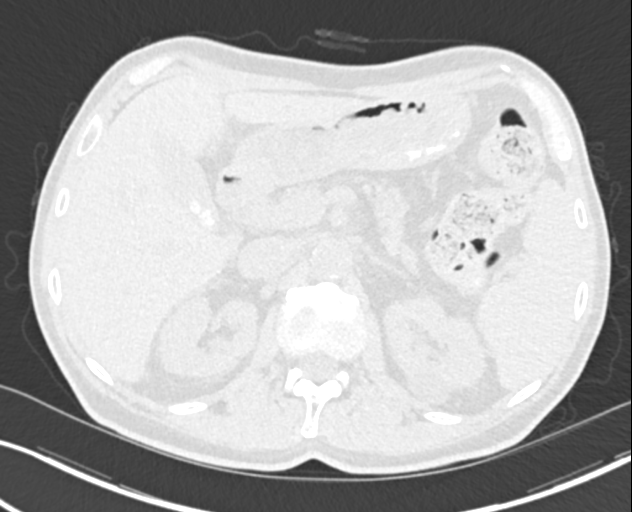
[im 27/173  lung]
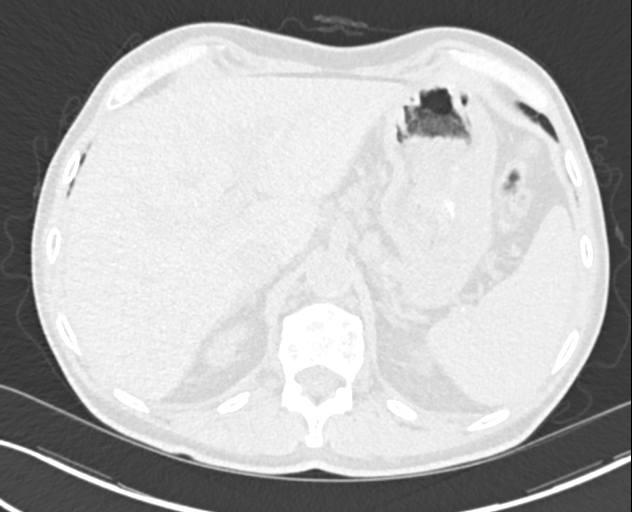
[im 53/173  lung]
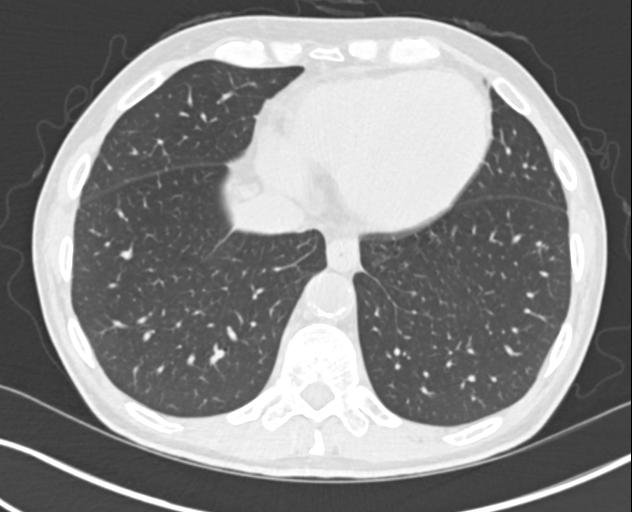
[im 67/173  lung]
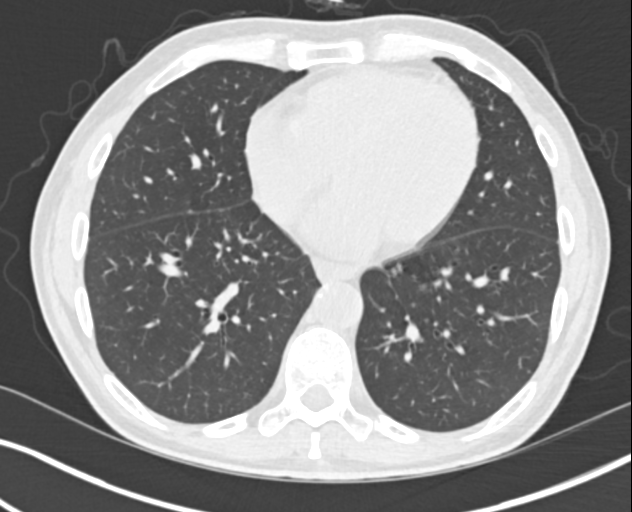
[im 80/173  mediastinal]
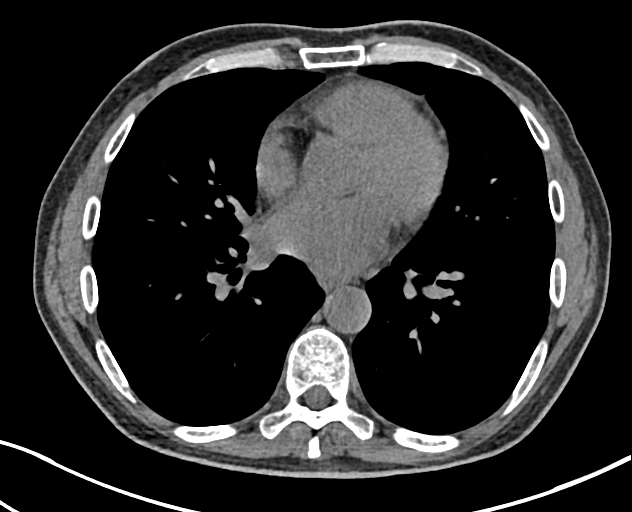
[im 80/173  lung]
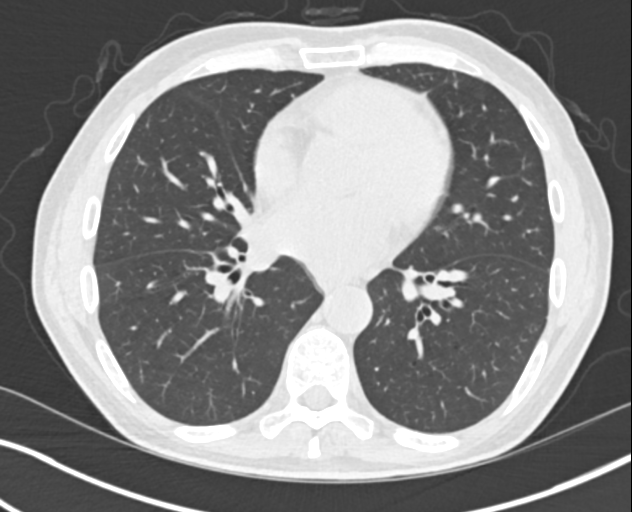
[im 93/173  lung]
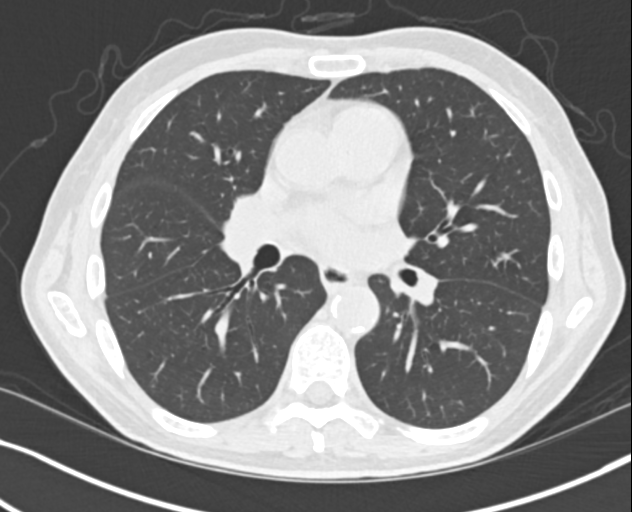
[im 106/173  lung]
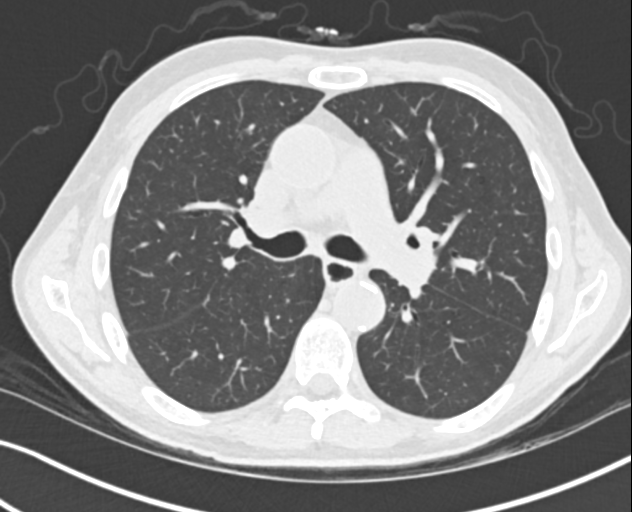
[im 133/173  lung]
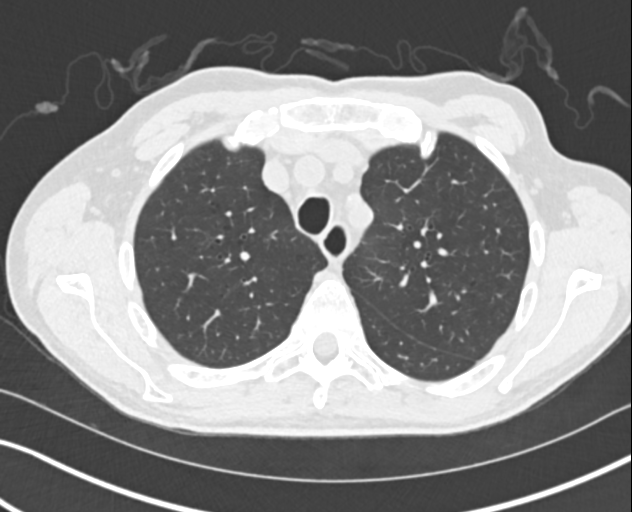
[im 146/173  mediastinal]
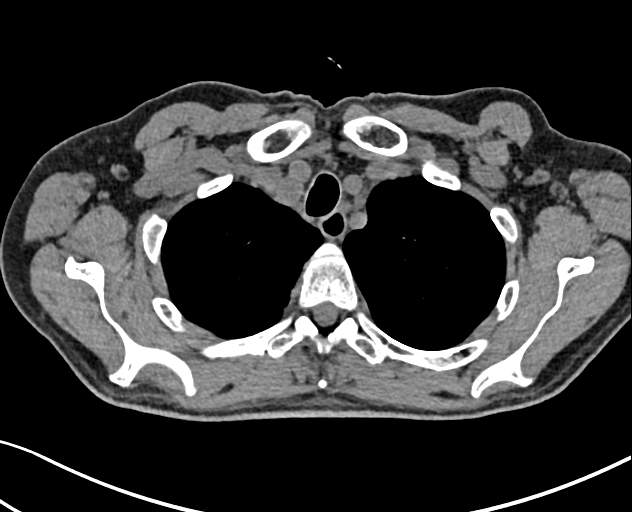
[im 146/173  lung]
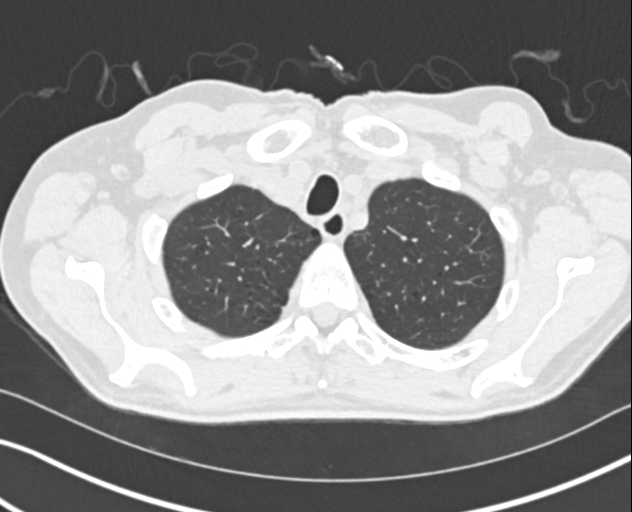
[im 159/173  lung]
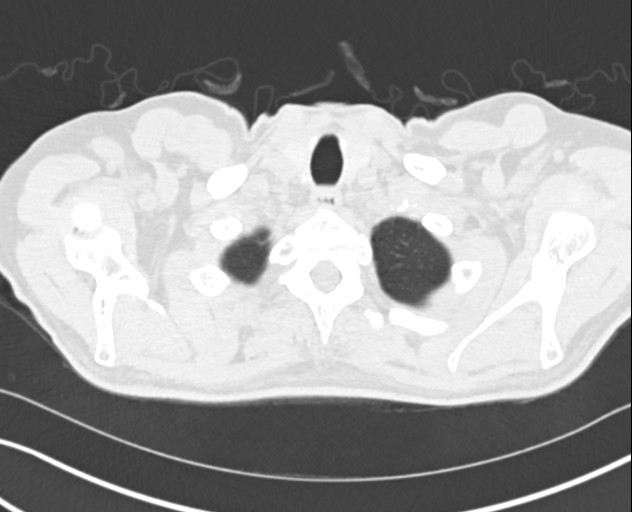

[Series 4: chest 2.00 br40 s3 · coronal · 0.68mm/px · 3 of 144 slices shown (2 of 2)]
[im 29/144  lung]
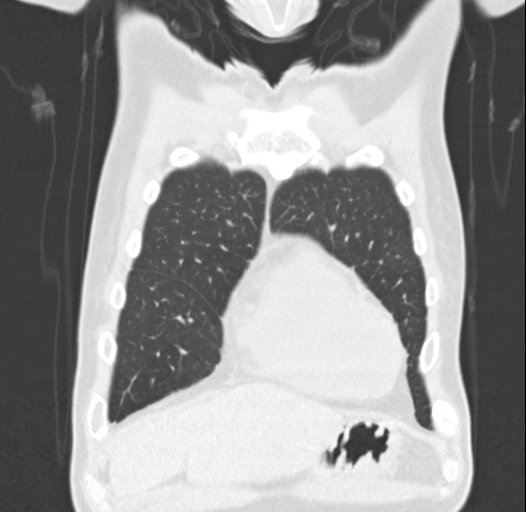
[im 58/144  lung]
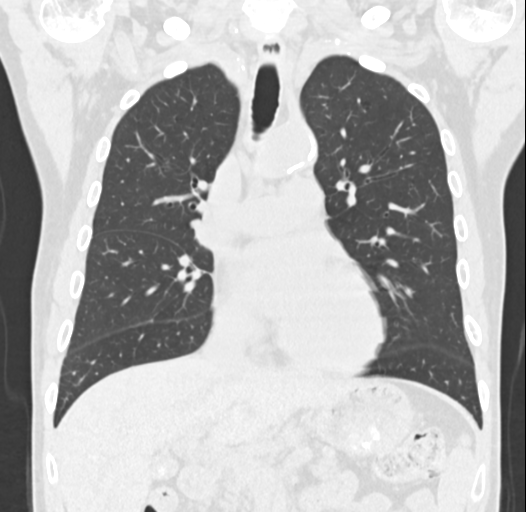
[im 86/144  lung]
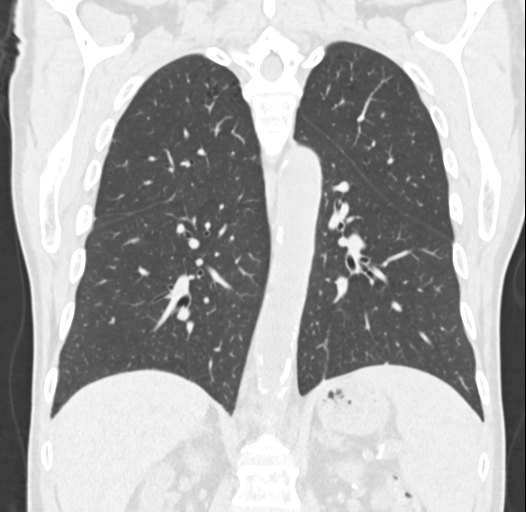

[13 of 36 positions shown; findings below may reference images not displayed]

FINDINGS: Cardiovascular: Heart is mildly enlarged. Trace fluid superior
pericardial recess. Thoracic aortic and coronary arterial vascular.

Mediastinum/Nodes: No enlarged axillary, mediastinal
lymphadenopathy. Normal appearance of esophagus.

Lungs/Pleura: Central airways are patent. Centrilobular and
paraseptal emphysematous change. Stable 2 mm right upper lobe
pulmonary nodule (image 6 series). Interval resolution previously
left upper. No.

Upper Abdomen: Cholelithiasis. Incompletely characterized fluid
attenuation lesion exophytic superior kidney. No acute process.

Musculoskeletal: Thoracic spine degenerative changes. No aggressive
or acute appearing osseous lesions.
IMPRESSION: 1. Interval resolution of previously described left upper lobe
nodule.
2. Emphysema and aortic atherosclerosis.

## 2020-02-27 DIAGNOSIS — E1129 Type 2 diabetes mellitus with other diabetic kidney complication: Secondary | ICD-10-CM | POA: Diagnosis not present

## 2020-02-27 DIAGNOSIS — Z992 Dependence on renal dialysis: Secondary | ICD-10-CM | POA: Diagnosis not present

## 2020-02-27 DIAGNOSIS — N186 End stage renal disease: Secondary | ICD-10-CM | POA: Diagnosis not present

## 2020-02-28 DIAGNOSIS — I700 Atherosclerosis of aorta: Secondary | ICD-10-CM | POA: Insufficient documentation

## 2020-03-29 DIAGNOSIS — Z992 Dependence on renal dialysis: Secondary | ICD-10-CM | POA: Diagnosis not present

## 2020-03-29 DIAGNOSIS — E1129 Type 2 diabetes mellitus with other diabetic kidney complication: Secondary | ICD-10-CM | POA: Diagnosis not present

## 2020-03-29 DIAGNOSIS — N186 End stage renal disease: Secondary | ICD-10-CM | POA: Diagnosis not present

## 2020-04-22 DIAGNOSIS — Z20822 Contact with and (suspected) exposure to covid-19: Secondary | ICD-10-CM | POA: Diagnosis not present

## 2020-04-22 DIAGNOSIS — Z7189 Other specified counseling: Secondary | ICD-10-CM | POA: Diagnosis not present

## 2020-04-25 DIAGNOSIS — E119 Type 2 diabetes mellitus without complications: Secondary | ICD-10-CM | POA: Diagnosis not present

## 2020-04-25 DIAGNOSIS — K2100 Gastro-esophageal reflux disease with esophagitis, without bleeding: Secondary | ICD-10-CM | POA: Diagnosis not present

## 2020-04-25 DIAGNOSIS — Z79899 Other long term (current) drug therapy: Secondary | ICD-10-CM | POA: Diagnosis not present

## 2020-04-25 DIAGNOSIS — N185 Chronic kidney disease, stage 5: Secondary | ICD-10-CM | POA: Diagnosis not present

## 2020-04-25 DIAGNOSIS — K449 Diaphragmatic hernia without obstruction or gangrene: Secondary | ICD-10-CM | POA: Diagnosis not present

## 2020-04-25 DIAGNOSIS — K209 Esophagitis, unspecified without bleeding: Secondary | ICD-10-CM | POA: Diagnosis not present

## 2020-04-25 DIAGNOSIS — Z7984 Long term (current) use of oral hypoglycemic drugs: Secondary | ICD-10-CM | POA: Diagnosis not present

## 2020-04-25 DIAGNOSIS — Z09 Encounter for follow-up examination after completed treatment for conditions other than malignant neoplasm: Secondary | ICD-10-CM | POA: Diagnosis not present

## 2020-04-27 DIAGNOSIS — I708 Atherosclerosis of other arteries: Secondary | ICD-10-CM | POA: Diagnosis not present

## 2020-04-27 DIAGNOSIS — I10 Essential (primary) hypertension: Secondary | ICD-10-CM | POA: Diagnosis not present

## 2020-04-27 DIAGNOSIS — Z8619 Personal history of other infectious and parasitic diseases: Secondary | ICD-10-CM | POA: Diagnosis not present

## 2020-04-27 DIAGNOSIS — Z01810 Encounter for preprocedural cardiovascular examination: Secondary | ICD-10-CM | POA: Diagnosis not present

## 2020-04-27 DIAGNOSIS — E1121 Type 2 diabetes mellitus with diabetic nephropathy: Secondary | ICD-10-CM | POA: Diagnosis not present

## 2020-04-27 DIAGNOSIS — Z720 Tobacco use: Secondary | ICD-10-CM | POA: Diagnosis not present

## 2020-04-27 DIAGNOSIS — E119 Type 2 diabetes mellitus without complications: Secondary | ICD-10-CM | POA: Diagnosis not present

## 2020-04-27 DIAGNOSIS — N049 Nephrotic syndrome with unspecified morphologic changes: Secondary | ICD-10-CM | POA: Diagnosis not present

## 2020-04-27 DIAGNOSIS — N289 Disorder of kidney and ureter, unspecified: Secondary | ICD-10-CM | POA: Diagnosis not present

## 2020-04-27 DIAGNOSIS — Z01818 Encounter for other preprocedural examination: Secondary | ICD-10-CM | POA: Diagnosis not present

## 2020-04-27 DIAGNOSIS — E785 Hyperlipidemia, unspecified: Secondary | ICD-10-CM | POA: Diagnosis not present

## 2020-04-27 DIAGNOSIS — Z7682 Awaiting organ transplant status: Secondary | ICD-10-CM | POA: Diagnosis not present

## 2020-04-27 DIAGNOSIS — Z992 Dependence on renal dialysis: Secondary | ICD-10-CM | POA: Diagnosis not present

## 2020-04-27 DIAGNOSIS — N186 End stage renal disease: Secondary | ICD-10-CM | POA: Diagnosis not present

## 2020-04-27 DIAGNOSIS — K209 Esophagitis, unspecified without bleeding: Secondary | ICD-10-CM | POA: Diagnosis not present

## 2020-04-27 DIAGNOSIS — I517 Cardiomegaly: Secondary | ICD-10-CM | POA: Diagnosis not present

## 2020-04-28 DIAGNOSIS — Z992 Dependence on renal dialysis: Secondary | ICD-10-CM | POA: Diagnosis not present

## 2020-04-28 DIAGNOSIS — N186 End stage renal disease: Secondary | ICD-10-CM | POA: Diagnosis not present

## 2020-04-28 DIAGNOSIS — E1129 Type 2 diabetes mellitus with other diabetic kidney complication: Secondary | ICD-10-CM | POA: Diagnosis not present

## 2020-05-01 DIAGNOSIS — R252 Cramp and spasm: Secondary | ICD-10-CM | POA: Insufficient documentation

## 2020-05-01 DIAGNOSIS — I953 Hypotension of hemodialysis: Secondary | ICD-10-CM | POA: Insufficient documentation

## 2020-05-01 DIAGNOSIS — Z992 Dependence on renal dialysis: Secondary | ICD-10-CM | POA: Insufficient documentation

## 2020-05-01 DIAGNOSIS — Z4931 Encounter for adequacy testing for hemodialysis: Secondary | ICD-10-CM | POA: Insufficient documentation

## 2020-05-17 DIAGNOSIS — N186 End stage renal disease: Secondary | ICD-10-CM | POA: Diagnosis not present

## 2020-05-17 DIAGNOSIS — T82898A Other specified complication of vascular prosthetic devices, implants and grafts, initial encounter: Secondary | ICD-10-CM | POA: Diagnosis not present

## 2020-05-17 DIAGNOSIS — Z992 Dependence on renal dialysis: Secondary | ICD-10-CM | POA: Diagnosis not present

## 2020-05-29 DIAGNOSIS — E1129 Type 2 diabetes mellitus with other diabetic kidney complication: Secondary | ICD-10-CM | POA: Diagnosis not present

## 2020-05-29 DIAGNOSIS — Z992 Dependence on renal dialysis: Secondary | ICD-10-CM | POA: Diagnosis not present

## 2020-05-29 DIAGNOSIS — N186 End stage renal disease: Secondary | ICD-10-CM | POA: Diagnosis not present

## 2020-05-30 DIAGNOSIS — I998 Other disorder of circulatory system: Secondary | ICD-10-CM

## 2020-05-30 HISTORY — DX: Other disorder of circulatory system: I99.8

## 2020-06-06 DIAGNOSIS — F17210 Nicotine dependence, cigarettes, uncomplicated: Secondary | ICD-10-CM | POA: Diagnosis not present

## 2020-06-06 DIAGNOSIS — Z0000 Encounter for general adult medical examination without abnormal findings: Secondary | ICD-10-CM | POA: Diagnosis not present

## 2020-06-06 DIAGNOSIS — N183 Chronic kidney disease, stage 3 unspecified: Secondary | ICD-10-CM | POA: Diagnosis not present

## 2020-06-06 DIAGNOSIS — E1165 Type 2 diabetes mellitus with hyperglycemia: Secondary | ICD-10-CM | POA: Diagnosis not present

## 2020-06-06 DIAGNOSIS — E7800 Pure hypercholesterolemia, unspecified: Secondary | ICD-10-CM | POA: Diagnosis not present

## 2020-06-06 DIAGNOSIS — Z0001 Encounter for general adult medical examination with abnormal findings: Secondary | ICD-10-CM | POA: Diagnosis not present

## 2020-06-06 DIAGNOSIS — I700 Atherosclerosis of aorta: Secondary | ICD-10-CM | POA: Diagnosis not present

## 2020-06-06 DIAGNOSIS — J438 Other emphysema: Secondary | ICD-10-CM | POA: Diagnosis not present

## 2020-06-06 DIAGNOSIS — Z125 Encounter for screening for malignant neoplasm of prostate: Secondary | ICD-10-CM | POA: Diagnosis not present

## 2020-06-06 DIAGNOSIS — E1122 Type 2 diabetes mellitus with diabetic chronic kidney disease: Secondary | ICD-10-CM | POA: Diagnosis not present

## 2020-06-06 DIAGNOSIS — Z13220 Encounter for screening for lipoid disorders: Secondary | ICD-10-CM | POA: Diagnosis not present

## 2020-06-18 ENCOUNTER — Other Ambulatory Visit: Payer: Self-pay

## 2020-06-18 ENCOUNTER — Inpatient Hospital Stay (HOSPITAL_COMMUNITY)
Admission: AD | Admit: 2020-06-18 | Discharge: 2020-06-20 | DRG: 629 | Disposition: A | Discharge status: Home / Self Care | Payer: BC Managed Care – PPO | Attending: Internal Medicine | Admitting: Internal Medicine

## 2020-06-18 ENCOUNTER — Emergency Department (HOSPITAL_COMMUNITY): Payer: BC Managed Care – PPO

## 2020-06-18 ENCOUNTER — Encounter (HOSPITAL_COMMUNITY): Payer: Self-pay

## 2020-06-18 ENCOUNTER — Ambulatory Visit (INDEPENDENT_AMBULATORY_CARE_PROVIDER_SITE_OTHER)
Admission: EM | Admit: 2020-06-18 | Discharge: 2020-06-18 | Disposition: A | Discharge status: Home / Self Care | Payer: BC Managed Care – PPO | Source: Home / Self Care

## 2020-06-18 DIAGNOSIS — H409 Unspecified glaucoma: Secondary | ICD-10-CM | POA: Diagnosis not present

## 2020-06-18 DIAGNOSIS — Z79899 Other long term (current) drug therapy: Secondary | ICD-10-CM | POA: Diagnosis not present

## 2020-06-18 DIAGNOSIS — J45909 Unspecified asthma, uncomplicated: Secondary | ICD-10-CM | POA: Diagnosis present

## 2020-06-18 DIAGNOSIS — Z7984 Long term (current) use of oral hypoglycemic drugs: Secondary | ICD-10-CM

## 2020-06-18 DIAGNOSIS — Z992 Dependence on renal dialysis: Secondary | ICD-10-CM | POA: Diagnosis not present

## 2020-06-18 DIAGNOSIS — L97522 Non-pressure chronic ulcer of other part of left foot with fat layer exposed: Secondary | ICD-10-CM

## 2020-06-18 DIAGNOSIS — L97529 Non-pressure chronic ulcer of other part of left foot with unspecified severity: Secondary | ICD-10-CM

## 2020-06-18 DIAGNOSIS — Z20822 Contact with and (suspected) exposure to covid-19: Secondary | ICD-10-CM | POA: Diagnosis present

## 2020-06-18 DIAGNOSIS — L97519 Non-pressure chronic ulcer of other part of right foot with unspecified severity: Secondary | ICD-10-CM | POA: Diagnosis present

## 2020-06-18 DIAGNOSIS — D631 Anemia in chronic kidney disease: Secondary | ICD-10-CM | POA: Diagnosis present

## 2020-06-18 DIAGNOSIS — Z1212 Encounter for screening for malignant neoplasm of rectum: Secondary | ICD-10-CM | POA: Diagnosis not present

## 2020-06-18 DIAGNOSIS — E8770 Fluid overload, unspecified: Secondary | ICD-10-CM | POA: Diagnosis not present

## 2020-06-18 DIAGNOSIS — E11621 Type 2 diabetes mellitus with foot ulcer: Principal | ICD-10-CM | POA: Diagnosis present

## 2020-06-18 DIAGNOSIS — E1122 Type 2 diabetes mellitus with diabetic chronic kidney disease: Secondary | ICD-10-CM | POA: Diagnosis present

## 2020-06-18 DIAGNOSIS — I70244 Atherosclerosis of native arteries of left leg with ulceration of heel and midfoot: Secondary | ICD-10-CM | POA: Diagnosis not present

## 2020-06-18 DIAGNOSIS — I998 Other disorder of circulatory system: Secondary | ICD-10-CM | POA: Diagnosis present

## 2020-06-18 DIAGNOSIS — E1129 Type 2 diabetes mellitus with other diabetic kidney complication: Secondary | ICD-10-CM | POA: Diagnosis not present

## 2020-06-18 DIAGNOSIS — Z7682 Awaiting organ transplant status: Secondary | ICD-10-CM | POA: Diagnosis not present

## 2020-06-18 DIAGNOSIS — Z1211 Encounter for screening for malignant neoplasm of colon: Secondary | ICD-10-CM | POA: Diagnosis not present

## 2020-06-18 DIAGNOSIS — M79672 Pain in left foot: Secondary | ICD-10-CM | POA: Diagnosis not present

## 2020-06-18 DIAGNOSIS — N2581 Secondary hyperparathyroidism of renal origin: Secondary | ICD-10-CM | POA: Diagnosis not present

## 2020-06-18 DIAGNOSIS — E8889 Other specified metabolic disorders: Secondary | ICD-10-CM | POA: Diagnosis present

## 2020-06-18 DIAGNOSIS — E1151 Type 2 diabetes mellitus with diabetic peripheral angiopathy without gangrene: Secondary | ICD-10-CM | POA: Diagnosis present

## 2020-06-18 DIAGNOSIS — E119 Type 2 diabetes mellitus without complications: Secondary | ICD-10-CM

## 2020-06-18 DIAGNOSIS — I959 Hypotension, unspecified: Secondary | ICD-10-CM | POA: Diagnosis not present

## 2020-06-18 DIAGNOSIS — E11628 Type 2 diabetes mellitus with other skin complications: Secondary | ICD-10-CM

## 2020-06-18 DIAGNOSIS — E1169 Type 2 diabetes mellitus with other specified complication: Secondary | ICD-10-CM | POA: Diagnosis not present

## 2020-06-18 DIAGNOSIS — F17210 Nicotine dependence, cigarettes, uncomplicated: Secondary | ICD-10-CM | POA: Diagnosis not present

## 2020-06-18 DIAGNOSIS — I10 Essential (primary) hypertension: Secondary | ICD-10-CM | POA: Diagnosis present

## 2020-06-18 DIAGNOSIS — D638 Anemia in other chronic diseases classified elsewhere: Secondary | ICD-10-CM | POA: Diagnosis present

## 2020-06-18 DIAGNOSIS — L97422 Non-pressure chronic ulcer of left heel and midfoot with fat layer exposed: Secondary | ICD-10-CM | POA: Diagnosis present

## 2020-06-18 DIAGNOSIS — L03116 Cellulitis of left lower limb: Secondary | ICD-10-CM | POA: Diagnosis not present

## 2020-06-18 DIAGNOSIS — L03119 Cellulitis of unspecified part of limb: Secondary | ICD-10-CM

## 2020-06-18 DIAGNOSIS — I120 Hypertensive chronic kidney disease with stage 5 chronic kidney disease or end stage renal disease: Secondary | ICD-10-CM | POA: Diagnosis present

## 2020-06-18 DIAGNOSIS — N186 End stage renal disease: Secondary | ICD-10-CM | POA: Diagnosis present

## 2020-06-18 DIAGNOSIS — E1142 Type 2 diabetes mellitus with diabetic polyneuropathy: Secondary | ICD-10-CM | POA: Diagnosis present

## 2020-06-18 DIAGNOSIS — L97525 Non-pressure chronic ulcer of other part of left foot with muscle involvement without evidence of necrosis: Secondary | ICD-10-CM | POA: Diagnosis not present

## 2020-06-18 DIAGNOSIS — Z03818 Encounter for observation for suspected exposure to other biological agents ruled out: Secondary | ICD-10-CM | POA: Diagnosis not present

## 2020-06-18 DIAGNOSIS — L97429 Non-pressure chronic ulcer of left heel and midfoot with unspecified severity: Secondary | ICD-10-CM | POA: Diagnosis not present

## 2020-06-18 DIAGNOSIS — I739 Peripheral vascular disease, unspecified: Secondary | ICD-10-CM

## 2020-06-18 LAB — BASIC METABOLIC PANEL
Anion gap: 15 (ref 5–15)
BUN: 70 mg/dL — ABNORMAL HIGH (ref 8–23)
CO2: 24 mmol/L (ref 22–32)
Calcium: 8.8 mg/dL — ABNORMAL LOW (ref 8.9–10.3)
Chloride: 97 mmol/L — ABNORMAL LOW (ref 98–111)
Creatinine, Ser: 8.12 mg/dL — ABNORMAL HIGH (ref 0.61–1.24)
GFR calc Af Amer: 7 mL/min — ABNORMAL LOW (ref >60)
GFR calc non Af Amer: 6 mL/min — ABNORMAL LOW (ref >60)
Glucose, Bld: 194 mg/dL — ABNORMAL HIGH (ref 70–99)
Potassium: 3.9 mmol/L (ref 3.5–5.1)
Sodium: 136 mmol/L (ref 135–145)

## 2020-06-18 LAB — CBC WITH DIFFERENTIAL/PLATELET
Abs Immature Granulocytes: 0.05 10*3/uL (ref 0.00–0.07)
Basophils Absolute: 0 10*3/uL (ref 0.0–0.1)
Basophils Relative: 0 %
Eosinophils Absolute: 0.2 10*3/uL (ref 0.0–0.5)
Eosinophils Relative: 1 %
HCT: 35.1 % — ABNORMAL LOW (ref 39.0–52.0)
Hemoglobin: 11.7 g/dL — ABNORMAL LOW (ref 13.0–17.0)
Immature Granulocytes: 0 %
Lymphocytes Relative: 14 %
Lymphs Abs: 1.5 10*3/uL (ref 0.7–4.0)
MCH: 32.1 pg (ref 26.0–34.0)
MCHC: 33.3 g/dL (ref 30.0–36.0)
MCV: 96.2 fL (ref 80.0–100.0)
Monocytes Absolute: 1 10*3/uL (ref 0.1–1.0)
Monocytes Relative: 9 %
Neutro Abs: 8.4 10*3/uL — ABNORMAL HIGH (ref 1.7–7.7)
Neutrophils Relative %: 76 %
Platelets: 171 10*3/uL (ref 150–400)
RBC: 3.65 MIL/uL — ABNORMAL LOW (ref 4.22–5.81)
RDW: 12.7 % (ref 11.5–15.5)
WBC: 11.2 10*3/uL — ABNORMAL HIGH (ref 4.0–10.5)
nRBC: 0 % (ref 0.0–0.2)

## 2020-06-18 LAB — CBG MONITORING, ED: Glucose-Capillary: 216 mg/dL — ABNORMAL HIGH (ref 70–99)

## 2020-06-18 LAB — LACTIC ACID, PLASMA: Lactic Acid, Venous: 0.9 mmol/L (ref 0.5–1.9)

## 2020-06-18 LAB — SARS CORONAVIRUS 2 BY RT PCR (HOSPITAL ORDER, PERFORMED IN ~~LOC~~ HOSPITAL LAB): SARS Coronavirus 2: NEGATIVE

## 2020-06-18 IMAGING — DX DG FOOT COMPLETE 3+V*L*
2 series · 3 of 3 positions shown · non-contrast
Comparison: None.

CLINICAL DATA: 64-year-old male with left foot pain.

EXAM:
LEFT FOOT - COMPLETE 3+ VIEW

[Series 1: foot · 0.14mm/px · 2 of 2 slices shown]
[im 1/2]
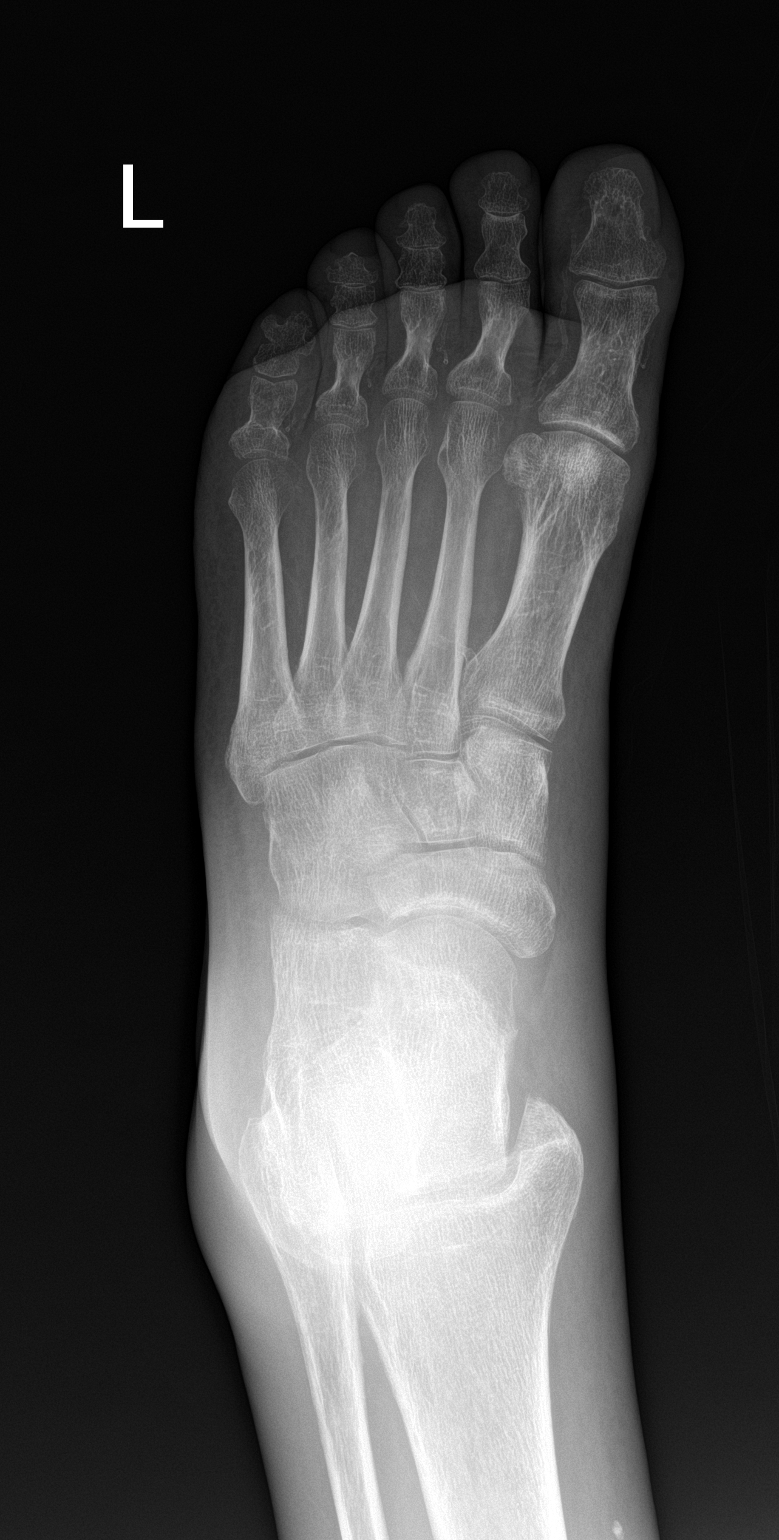
[im 2/2]
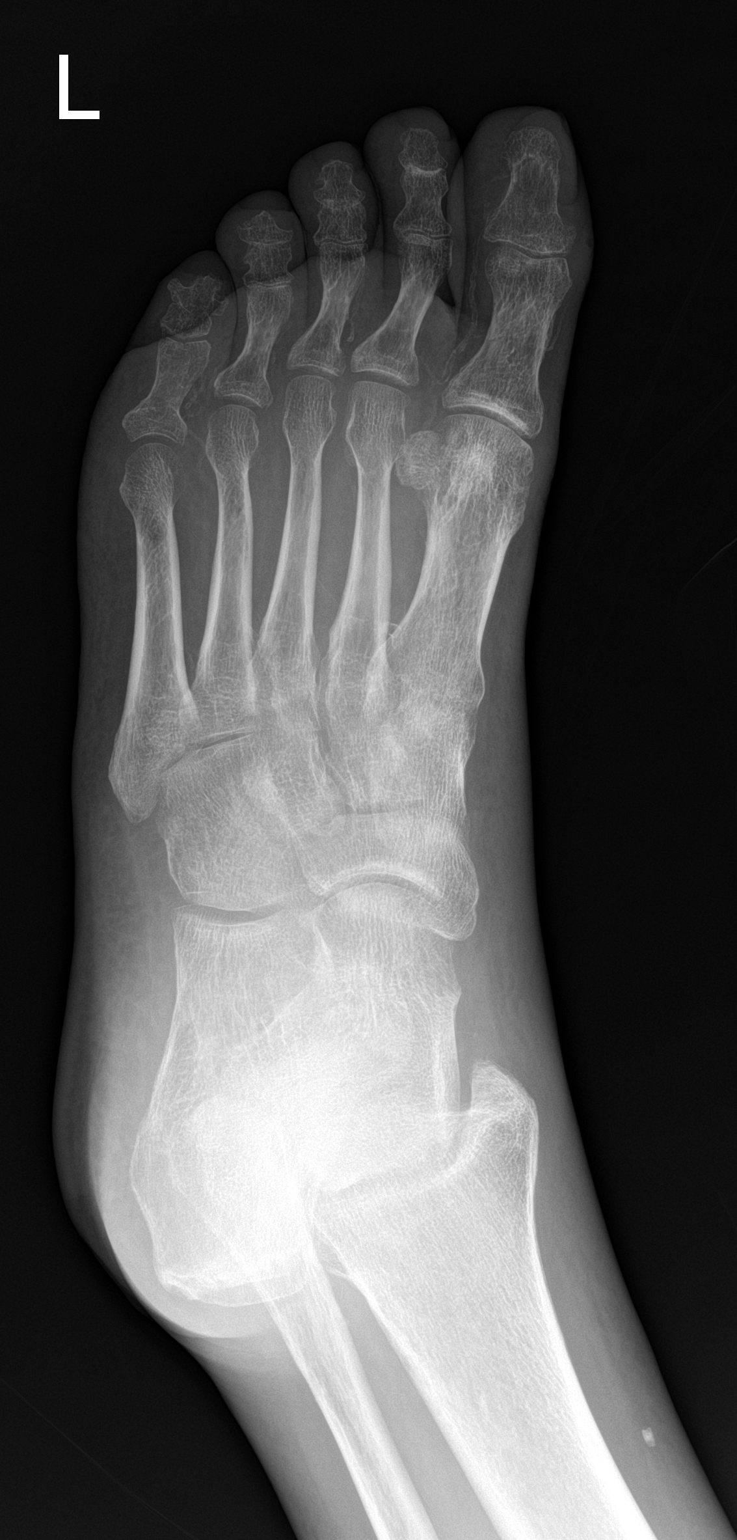

[leg]
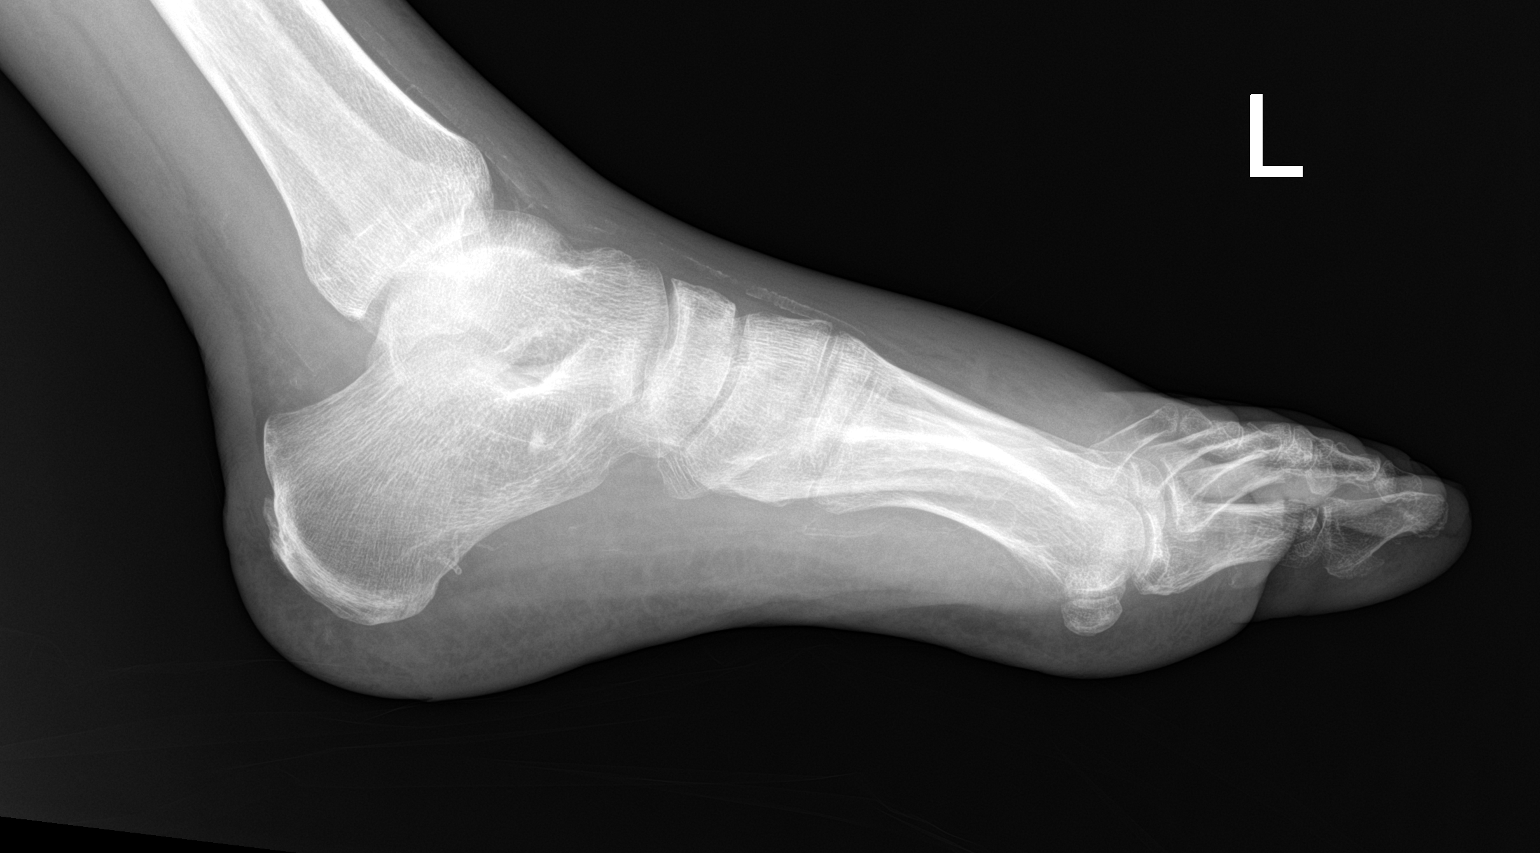

[3 of 3 positions shown; findings below may reference images not displayed]

FINDINGS: There is no acute fracture or dislocation. The bones are osteopenic.
No bone erosion or periosteal elevation to suggest acute
osteomyelitis. There is diffuse soft tissue swelling of the dorsum
of the foot primarily involving the forefoot. No radiopaque foreign
object or soft tissue gas. Vascular calcification.
IMPRESSION: No acute fracture or dislocation.

## 2020-06-18 MED ORDER — INSULIN ASPART 100 UNIT/ML ~~LOC~~ SOLN
0.0000 [IU] | SUBCUTANEOUS | Status: DC
  Administered 2020-06-18: 2 [IU] via SUBCUTANEOUS
  Administered 2020-06-19 (×2): 1 [IU] via SUBCUTANEOUS
  Administered 2020-06-19: 2 [IU] via SUBCUTANEOUS
  Administered 2020-06-20 (×2): 1 [IU] via SUBCUTANEOUS

## 2020-06-18 MED ORDER — PANTOPRAZOLE SODIUM 40 MG PO TBEC
40.0000 mg | DELAYED_RELEASE_TABLET | Freq: Every day | ORAL | Status: DC
  Administered 2020-06-19 – 2020-06-20 (×2): 40 mg via ORAL
  Filled 2020-06-18 (×2): qty 1

## 2020-06-18 MED ORDER — FERRIC CITRATE 1 GM 210 MG(FE) PO TABS
420.0000 mg | ORAL_TABLET | Freq: Three times a day (TID) | ORAL | Status: DC
  Administered 2020-06-19: 420 mg via ORAL
  Filled 2020-06-18 (×5): qty 2

## 2020-06-18 MED ORDER — MUPIROCIN 2 % EX OINT
1.0000 "application " | TOPICAL_OINTMENT | CUTANEOUS | Status: DC
  Filled 2020-06-18: qty 22

## 2020-06-18 MED ORDER — LIDOCAINE-PRILOCAINE 2.5-2.5 % EX CREA
1.0000 "application " | TOPICAL_CREAM | CUTANEOUS | Status: DC
  Filled 2020-06-18: qty 5

## 2020-06-18 MED ORDER — GABAPENTIN 300 MG PO CAPS
300.0000 mg | ORAL_CAPSULE | Freq: Every day | ORAL | Status: DC
  Administered 2020-06-18 – 2020-06-20 (×3): 300 mg via ORAL
  Filled 2020-06-18 (×2): qty 1
  Filled 2020-06-18: qty 3

## 2020-06-18 MED ORDER — RENA-VITE PO TABS
1.0000 | ORAL_TABLET | Freq: Every day | ORAL | Status: DC
  Administered 2020-06-19: 1 via ORAL
  Filled 2020-06-18 (×2): qty 1

## 2020-06-18 MED ORDER — VANCOMYCIN HCL IN DEXTROSE 1-5 GM/200ML-% IV SOLN
1000.0000 mg | Freq: Once | INTRAVENOUS | Status: AC
  Administered 2020-06-18: 1000 mg via INTRAVENOUS
  Filled 2020-06-18: qty 200

## 2020-06-18 NOTE — ED Triage Notes (Signed)
Pt present swelling and blister on both feet. Symptoms started 3 days ago. Pt is experience pain when he walks and apply pressure on his feet

## 2020-06-18 NOTE — ED Provider Notes (Addendum)
[name] EMERGENCY DEPARTMENT Provider Note   CSN: [id number] Arrival date & time: 06/18/20  1533     History Chief Complaint  Patient presents with  . Foot Pain    Duane Hall is a 64 y.o. male.  64 year old male with history of end-stage renal disease on home dialysis presents with bilateral lower extremity swelling as well as erythema at his left ankle.  Denies any trauma.  No fever or chills.  No purulent drainage noted.  Has had increasing discomfort to both lower extremities.  Went to urgent care and was sent here for further management.        Past Medical History:  Diagnosis Date  . Asthma   . Cancer ([place])   . Cataract   . CKD (chronic kidney disease), stage III   . Diabetes mellitus   . Glaucoma     Patient Active Problem List   Diagnosis Date Noted  . Hypokalemia 09/09/2019  . Volume overload 09/08/2019  . Diabetic retinopathy ([place]) 08/25/2015  . Claudication ([place]) 04/07/2015  . Anemia of chronic disease 09/12/2014  . CKD (chronic kidney disease) 08/03/2014  . Hepatitis C 10/15/2013  . Pain in joint, shoulder region 02/10/2013  . Type II diabetes mellitus ([place]) 02/24/2012  . Hypertension 02/24/2012  . Asthma 02/24/2012    Past Surgical History:  Procedure Laterality Date  . AV FISTULA PLACEMENT Right 09/14/2019   Procedure: Right Arm Basilic Vein transposition;  Surgeon: Angelia Mould, MD;  Location: [facility];  Service: Vascular;  Laterality: Right;  . COLONOSCOPY    . UPPER GASTROINTESTINAL ENDOSCOPY  02/2019   Dr Benson Norway         Family History  Problem Relation Age of Onset  . Cancer Father     Social History   Tobacco Use  . Smoking status: Current Every Day Smoker    Packs/day: 0.80    Types: Cigarettes  . Smokeless tobacco: Never Used  Vaping Use  . Vaping Use: Never used  Substance Use Topics  . Alcohol use: No    Alcohol/week: 0.0 standard drinks  . Drug use: No    Home Medications Prior to  Admission medications   Medication Sig Start Date End Date Taking? Authorizing Provider  aspirin EC 81 MG tablet Take 81 mg by mouth as needed for mild pain.    [provider]  bumetanide (BUMEX) 1 MG tablet Take 3 tablets (3 mg total) by mouth 3 (three) times daily. Patient not taking: Reported on 10/27/2019 09/14/19   Hosie Poisson, MD  ferrous sulfate 325 (65 FE) MG tablet Take 1 tablet (325 mg total) by mouth daily with breakfast. Patient not taking: Reported on 10/27/2019 09/15/19   Hosie Poisson, MD  glipiZIDE (GLUCOTROL XL) 5 MG 24 hr tablet Take 1 tablet (5 mg total) by mouth daily with breakfast. 09/14/19   Hosie Poisson, MD  metolazone (ZAROXOLYN) 5 MG tablet Take 1 tablet (5 mg total) by mouth daily. Patient not taking: Reported on 10/27/2019 09/14/19   Hosie Poisson, MD  nicotine (NICODERM CQ - DOSED IN MG/24 HOURS) 21 mg/24hr patch Place 1 patch (21 mg total) onto the skin daily. Patient not taking: Reported on 10/27/2019 09/15/19   Hosie Poisson, MD  omeprazole (PRILOSEC) 40 MG capsule Take 40 mg by mouth daily.    [provider]  senna-docusate (SENOKOT-S) 8.6-50 MG tablet Take 2 tablets by mouth 2 (two) times daily. Patient not taking: Reported on 10/27/2019 09/14/19   Karleen Hampshire,  Jeoffrey Massed, MD  Vitamin D, Ergocalciferol, (DRISDOL) 1.25 MG (50000 UT) CAPS capsule Take 1 capsule by mouth once a week. Sundays 02/17/19   [provider]    Allergies    Patient has no known allergies.  Review of Systems   Review of Systems  All other systems reviewed and are negative.   Physical Exam Updated Vital Signs BP (!) 137/56 (BP Location: Right Arm)   Pulse 85   Temp 98.2 F (36.8 C) (Oral)   Resp 16   Ht 1.753 m (5\' 9" )   Wt 68 kg   SpO2 99%   BMI 22.15 kg/m   Physical Exam Vitals and nursing note reviewed.  Constitutional:      General: He is not in acute distress.    Appearance: Normal appearance. He is well-developed. He is not toxic-appearing.  HENT:      Head: Normocephalic and atraumatic.  Eyes:     General: Lids are normal.     Conjunctiva/sclera: Conjunctivae normal.     Pupils: Pupils are equal, round, and reactive to light.  Neck:     Thyroid: No thyroid mass.     Trachea: No tracheal deviation.  Cardiovascular:     Rate and Rhythm: Normal rate and regular rhythm.     Heart sounds: Normal heart sounds. No murmur heard.  No gallop.   Pulmonary:     Effort: Pulmonary effort is normal. No respiratory distress.     Breath sounds: Normal breath sounds. No stridor. No decreased breath sounds, wheezing, rhonchi or rales.  Abdominal:     General: Bowel sounds are normal. There is no distension.     Palpations: Abdomen is soft.     Tenderness: There is no abdominal tenderness. There is no rebound.  Musculoskeletal:        General: No tenderness. Normal range of motion.     Cervical back: Normal range of motion and neck supple.     Comments: Patient with findings of left lower extremity as seen in picture.  Patient is able to wiggle his toes and has sensation all of his toes on the left foot.  No crepitus appreciated.  Lymphadenopathy:     Comments: 3+ bilateral lower extremity pitting edema  Skin:    General: Skin is warm and dry.     Findings: No abrasion or rash.  Neurological:     Mental Status: He is alert and oriented to person, place, and time.     GCS: GCS eye subscore is 4. GCS verbal subscore is 5. GCS motor subscore is 6.     Cranial Nerves: No cranial nerve deficit.     Sensory: No sensory deficit.  Psychiatric:        Speech: Speech normal.        Behavior: Behavior normal.         ED Results / Procedures / Treatments   Labs (all labs ordered are listed, but only abnormal results are displayed) Labs Reviewed  CBC WITH DIFFERENTIAL/PLATELET - Abnormal; Notable for the following components:      Result Value   WBC 11.2 (*)    RBC 3.65 (*)    Hemoglobin 11.7 (*)    HCT 35.1 (*)    Neutro Abs 8.4 (*)     All other components within normal limits  BASIC METABOLIC PANEL - Abnormal; Notable for the following components:   Chloride 97 (*)    Glucose, Bld 194 (*)    BUN 70 (*)  Creatinine, Ser 8.12 (*)    Calcium 8.8 (*)    GFR calc non Af Amer 6 (*)    GFR calc Af Amer 7 (*)    All other components within normal limits  CULTURE, BLOOD (ROUTINE X 2)  CULTURE, BLOOD (ROUTINE X 2)  SARS CORONAVIRUS 2 BY RT PCR (HOSPITAL ORDER, [facility])  LACTIC ACID, PLASMA    EKG None  Radiology No results found.  Procedures Procedures (including critical care time)  Medications Ordered in ED Medications  vancomycin (VANCOCIN) IVPB 1000 mg/200 mL premix (has no administration in time range)    ED Course  I have reviewed the triage vital signs and the nursing notes.  Pertinent labs & imaging results that were available during my care of the patient were reviewed by me and considered in my medical decision making (see chart for details).    MDM Rules/Calculators/A&P                          Started on vancomycin.  Concern for early gangrene of his heel versus vascular insufficiency as patient does have a known history of peripheral vascular disease.  Will x-ray patient's foot.  Discussed with Dr. Gwenlyn Saran, on-call for vascular surgery, who will see the patient in the ED but agrees with admission  Final Clinical Impression(s) / ED Diagnoses Final diagnoses:  None    Rx / DC Orders ED Discharge Orders    None       Lacretia Leigh, MD 06/18/20 [facility]    Lacretia Leigh, MD 06/18/20 1930

## 2020-06-18 NOTE — H&P (Addendum)
[facility] History and Physical  Duane Hall UXN:[id number] DOB: 10/15/1956 DOA: 06/18/2020  Referring EDP: Zenia Resides PCP: Heywood Bene, PA-C   Chief Complaint: Left Foot Wound   HPI: Duane Hall is a 64 y.o. male with PMH of T2DM, asthma and ESRD secondary to advance diabetes presents to ER with left foot wound and admitted for further management of wound and workup of vascular insufficiency.   History provided by patient and his wife. Reports patient was in his usual state of health considering chronic illness until foot wound noted today. Wife reports that they had an appt on Wednesday with [facility] whom they follow with for kidney care and that a thorough foot/extremity exam was done at that time without abnormality. This afternoon, patient noted some blood on his left sock and asked his wife to investigate. She noted ulcer on the back of his left heel. Patient reports otherwise feeling fine. There has been a bit more swelling of lower extremities than normal and wife was planning to take 1.5L off tonight; patient does in-home HD 4x weekly. At baseline, he can walk independently although limited due to pain in his feet. Denies headache, dizziness, fever, chills, cough, SOB, chest pain, abdominal pain, nausea, vomiting, diarrhea, constipation, dysuria, hematuria, hematochezia, melena, difficulty moving arms/legs, speech difficulty, trouble eating, confusion or any other complaints.  In the ED: Vitals stable on room air; afebrile. Labs remarkable for WBC 11.2, Hgb 11.7, Na 136, K 3.9, glucose 194, BUN 70, Cr 8.12, Lactate 0.9.  XR Left Foot: Non-acute.  Patient was given one dose of Vancomycin. Vascular was called by EDP and reported they would seen patient in ED and agreed with admission.   Review of Systems:  All other systems negative unless noted above in HPI.   Past Medical History:  Diagnosis Date  . Asthma   . Cancer ([place])   . Cataract   . CKD (chronic kidney  disease), stage III   . Diabetes mellitus   . Glaucoma    Past Surgical History:  Procedure Laterality Date  . AV FISTULA PLACEMENT Right 09/14/2019   Procedure: Right Arm Basilic Vein transposition;  Surgeon: Angelia Mould, MD;  Location: [place];  Service: Vascular;  Laterality: Right;  . COLONOSCOPY    . UPPER GASTROINTESTINAL ENDOSCOPY  02/2019   Dr Benson [place]     Social History:  reports that he has been smoking cigarettes. He has been smoking about 0.80 packs per day. He has never used smokeless tobacco. He reports that he does not drink alcohol and does not use drugs.  No Known Allergies  Family History  Problem Relation Age of Onset  . Cancer Father     Prior to Admission medications   Medication Sig Start Date End Date Taking? Authorizing Provider  aspirin EC 81 MG tablet Take 81 mg by mouth as needed for mild pain.    [provider]  bumetanide (BUMEX) 1 MG tablet Take 3 tablets (3 mg total) by mouth 3 (three) times daily. Patient not taking: Reported on 10/27/2019 09/14/19   Hosie Poisson, MD  ferrous sulfate 325 (65 FE) MG tablet Take 1 tablet (325 mg total) by mouth daily with breakfast. Patient not taking: Reported on 10/27/2019 09/15/19   Hosie Poisson, MD  glipiZIDE (GLUCOTROL XL) 5 MG 24 hr tablet Take 1 tablet (5 mg total) by mouth daily with breakfast. 09/14/19   Hosie Poisson, MD  metolazone (ZAROXOLYN) 5 MG tablet Take 1 tablet (5 mg total)  by mouth daily. Patient not taking: Reported on 10/27/2019 09/14/19   Hosie Poisson, MD  nicotine (NICODERM CQ - DOSED IN MG/24 HOURS) 21 mg/24hr patch Place 1 patch (21 mg total) onto the skin daily. Patient not taking: Reported on 10/27/2019 09/15/19   Hosie Poisson, MD  omeprazole (PRILOSEC) 40 MG capsule Take 40 mg by mouth daily.    [provider]  senna-docusate (SENOKOT-S) 8.6-50 MG tablet Take 2 tablets by mouth 2 (two) times daily. Patient not taking: Reported on 10/27/2019 09/14/19   Hosie Poisson, MD   Vitamin D, Ergocalciferol, (DRISDOL) 1.25 MG (50000 UT) CAPS capsule Take 1 capsule by mouth once a week. Sundays 02/17/19   [provider]   Physical Exam: Vitals:   06/18/20 1542 06/18/20 1900 06/18/20 2030  BP: (!) 137/56 124/74 130/68  Pulse: 85 87 86  Resp: 16 18 18  Temp: 98.2 F (36.8 C)    TempSrc: Oral    SpO2: 99% 98% 97%  Weight: 68 kg    Height: 5\' 9" (1.753 m)      Wt Readings from Last 3 Encounters:  06/18/20 68 kg  10/27/19 68 kg  09/11/19 69.7 kg    . General:  Appears calm and comfortable; pale. AAOx4.  . Eyes: EOMI, normal lids, irises & conjunctiva . ENT: grossly normal hearing, lips & tongue . Neck: normal ROM . Cardiovascular: RRR, soft systolic murmur. 2+ LE edema; L>R. . Respiratory: CTA bilaterally, no w/r/r. Normal respiratory effort. . Abdomen: soft, ntnd . Skin: See photos of foot wounds below. Necrotic area of left heel with erythema appreciated proximally extending above ankle.  . Extremities: BLE warm to touch with sensation intact. Thrill at right arm fistula noted. DP pulses palpable bilaterally.  . Musculoskeletal: grossly normal tone BUE/BLE . Psychiatric: grossly normal mood and affect, speech fluent and appropriate . Neurologic: grossly non-focal.               Labs on Admission:  Basic Metabolic Panel: Recent Labs  Lab 06/18/20 1600  NA 136  K 3.9  CL 97*  CO2 24  GLUCOSE 194*  BUN 70*  CREATININE 8.12*  CALCIUM 8.8*   Liver Function Tests: No results for input(s): AST, ALT, ALKPHOS, BILITOT, PROT, ALBUMIN in the last 168 hours. No results for input(s): LIPASE, AMYLASE in the last 168 hours. No results for input(s): AMMONIA in the last 168 hours. CBC: Recent Labs  Lab 06/18/20 1600  WBC 11.2*  NEUTROABS 8.4*  HGB 11.7*  HCT 35.1*  MCV 96.2  PLT 171   Cardiac Enzymes: No results for input(s): CKTOTAL, CKMB, CKMBINDEX, TROPONINI in the last 168 hours.  BNP (last 3 results) Recent Labs     09 /09/20 1710  BNP 438.8*    ProBNP (last 3 results) No results for input(s): PROBNP in the last 8760 hours.  CBG: No results for input(s): GLUCAP in the last 168 hours.  Radiological Exams on Admission: DG Foot Complete Left  Result Date: 06/18/2020 CLINICAL DATA:  64 year old male with left foot pain. EXAM: LEFT FOOT - COMPLETE 3+ VIEW COMPARISON:  None. FINDINGS: There is no acute fracture or dislocation. The bones are osteopenic. No bone erosion or periosteal elevation to suggest acute osteomyelitis. There is diffuse soft tissue swelling of the dorsum of the foot primarily involving the forefoot. No radiopaque foreign object or soft tissue gas. Vascular calcification. IMPRESSION: No acute fracture or dislocation. Electronically Signed   By: Anner Crete M.D.   On: 06/18/2020 18:52  EKG: None  Assessment/Plan Principal Problem:   Vascular insufficiency of extremity Active Problems:   Type II diabetes mellitus (HCC)   Hypertension   Asthma   Anemia of chronic disease   Awaiting transplantation of kidney   Foot ulcer, left ([place])  64 y.o. male with PMH of T2DM, asthma and ESRD secondary to advance diabetes presents to ER with left foot wound and admitted for further management of wound and workup of vascular insufficiency.   Left Foot Ulcer Vascular Insufficiency - photos as above with reportedly normal foot exam on 6/16 - s/p Vancomycin dose in ED; will hold now due to ESRD and low suspicion for infectious etiology; more likely vascular insufficiency - reported ABI's done at [facility] recently; unable to view results in [place]  - NPO after midnight in case of pre-procedure - DVT ppx not ordered in case of pre-procedure; order PRN - Vascular consulted; awaiting recs - Wound Team consulted   ESRD - patient was started on dialysis in Sept 2020 - Biopsy done at that time showing advanced diabetic glomerulosclerosis in association with moderate to severe  arterionephrosclerosis, 60% global glomerulosclerosis, diffuse severe interstitial fibrosis and tubular atrophy, and FSGS with collapsing features - Does in-home HD 4x weekly - Consult Renal in AM for dialysis - fluid overloaded on exam  - Renal diet with fluid restriction - follows with [facility] - Awaiting transplant at [facility] or [facility]  T2DM - Hold home glipizide as patient has not eaten for past 24 hours and will keep NPO after midnight for now - Sliding scale with CBGs q4h - Glucose 194 on admission   Code Status: Full DVT Prophylaxis: None in case of procedure needed; order as needed after recs from Vascular  Family Communication: Wife at bedside Disposition Plan: Admit to inpatient. Patient requiring specialty consultation and further management/workup of ulcer. Patient is at high risk for further decompensation due to age and co-morbidities.    Time spent: 50 minutes  Chauncey Mann, MD [facility] Pager [phone]

## 2020-06-18 NOTE — ED Provider Notes (Signed)
[place]    CSN: [id number] Arrival date & time: 06/18/20  1252      History   Chief Complaint Chief Complaint  Patient presents with  . Blister    left and right foot  . Foot Swelling    HPI Duane Hall is a 64 y.o. male.    HPI  Patient with a history of PAD and type 2 diabetes,  presents for evaluation of a blister on his left foot and some small ulcerations on the right foot which are noninfectious and insignificant.  The left foot has a ulceration on the heel and there is evidence of cellulitis extending from the left foot and migrating up the left lower portion of the leg.  He denies known injury however endorses significant neuropathy causing impaired sensation affecting both feet.  He has pitting edema affecting the left foot and edema is also present in the right however left foot is greater than right.  He reports this is a chronic problem.  He is followed closely by [facility] for PCP and is followed by vascular surgery. Past Medical History:  Diagnosis Date  . Asthma   . Cancer ([place])   . Cataract   . CKD (chronic kidney disease), stage III   . Diabetes mellitus   . Glaucoma     Patient Active Problem List   Diagnosis Date Noted  . Hypokalemia 09/09/2019  . Volume overload 09/08/2019  . Diabetic retinopathy ([place]) 08/25/2015  . Claudication ([place]) 04/07/2015  . Anemia of chronic disease 09/12/2014  . CKD (chronic kidney disease) 08/03/2014  . Hepatitis C 10/15/2013  . Pain in joint, shoulder region 02/10/2013  . Type II diabetes mellitus ([place]) 02/24/2012  . Hypertension 02/24/2012  . Asthma 02/24/2012    Past Surgical History:  Procedure Laterality Date  . AV FISTULA PLACEMENT Right 09/14/2019   Procedure: Right Arm Basilic Vein transposition;  Surgeon: Angelia Mould, MD;  Location: [place];  Service: Vascular;  Laterality: Right;  . COLONOSCOPY    . UPPER GASTROINTESTINAL ENDOSCOPY  02/2019   Dr Benson [place]        Home  Medications    Prior to Admission medications   Medication Sig Start Date End Date Taking? Authorizing Provider  aspirin EC 81 MG tablet Take 81 mg by mouth as needed for mild pain.    [provider]  bumetanide (BUMEX) 1 MG tablet Take 3 tablets (3 mg total) by mouth 3 (three) times daily. Patient not taking: Reported on 10/27/2019 09/14/19   Hosie Poisson, MD  ferrous sulfate 325 (65 FE) MG tablet Take 1 tablet (325 mg total) by mouth daily with breakfast. Patient not taking: Reported on 10/27/2019 09/15/19   Hosie Poisson, MD  glipiZIDE (GLUCOTROL XL) 5 MG 24 hr tablet Take 1 tablet (5 mg total) by mouth daily with breakfast. 09/14/19   Hosie Poisson, MD  metolazone (ZAROXOLYN) 5 MG tablet Take 1 tablet (5 mg total) by mouth daily. Patient not taking: Reported on 10/27/2019 09/14/19   Hosie Poisson, MD  nicotine (NICODERM CQ - DOSED IN MG/24 HOURS) 21 mg/24hr patch Place 1 patch (21 mg total) onto the skin daily. Patient not taking: Reported on 10/27/2019 09/15/19   Hosie Poisson, MD  omeprazole (PRILOSEC) 40 MG capsule Take 40 mg by mouth daily.    [provider]  senna-docusate (SENOKOT-S) 8.6-50 MG tablet Take 2 tablets by mouth 2 (two) times daily. Patient not taking: Reported on 10/27/2019 09/14/19   Karleen Hampshire,  Jeoffrey Massed, MD  Vitamin D, Ergocalciferol, (DRISDOL) 1.25 MG (50000 UT) CAPS capsule Take 1 capsule by mouth once a week. Sundays 02/17/19   [provider]    Family History Family History  Problem Relation Age of Onset  . Cancer Father     Social History Social History   Tobacco Use  . Smoking status: Current Every Day Smoker    Packs/day: 0.80    Types: Cigarettes  . Smokeless tobacco: Never Used  Vaping Use  . Vaping Use: Never used  Substance Use Topics  . Alcohol use: No    Alcohol/week: 0.0 standard drinks  . Drug use: No     Allergies   Patient has no known allergies.   Review of Systems Review of Systems Pertinent negatives listed  in HPI   Physical Exam Triage Vital Signs ED Triage Vitals [06/18/20 1451]  Enc Vitals Group     BP (!) 149/73     Pulse Rate 82     Resp 18     Temp 98.1 F (36.7 C)     Temp Source Oral     SpO2 100 %     Weight      Height      Head Circumference      Peak Flow      Pain Score 6     Pain Loc      Pain Edu?      Excl. in [place]?    No data found.  Updated Vital Signs BP (!) 149/73 (BP Location: Right Arm)   Pulse 82   Temp 98.1 F (36.7 C) (Oral)   Resp 18   SpO2 100%   Visual Acuity Right Eye Distance:   Left Eye Distance:   Bilateral Distance:    Right Eye Near:   Left Eye Near:    Bilateral Near:     Physical Exam Constitutional:      Appearance: He is ill-appearing.  HENT:     Head: Normocephalic and atraumatic.  Cardiovascular:     Rate and Rhythm: Normal rate and regular rhythm.     Pulses: Normal pulses.     Heart sounds: Normal heart sounds.  Musculoskeletal:     Right lower leg: 1+ Edema present.     Left lower leg: 2+ Pitting Edema present.  Feet:     Left foot:     Skin integrity: Ulcer (heel fat exposed with necortic tissue present), skin breakdown and erythema present.  Psychiatric:        Mood and Affect: Affect is flat.      UC Treatments / Results  Labs (all labs ordered are listed, but only abnormal results are displayed) Labs Reviewed - No data to display  EKG   Radiology No results found.  Procedures Procedures (including critical care time)  Medications Ordered in UC Medications - No data to display  Initial Impression / Assessment and Plan / UC Course  I have reviewed the triage vital signs and the nursing notes.  Pertinent labs & imaging results that were available during my care of the patient were reviewed by me and considered in my medical decision making (see chart for details).    Patient deferred to ER for further work-up and evaluation given the complexity of his foot wound along with his history of  diabetes and PAD.  He also has cellulitis expanding from the wound and significant edema  Final Clinical Impressions(s) / UC Diagnoses   Final diagnoses:  Cellulitis  in diabetic foot ([place])  Ulcer of left foot with fat layer exposed ([place])  PAD (peripheral artery disease) [facility])   Discharge Instructions   None    ED Prescriptions    None     PDMP not reviewed this encounter.   Scot Jun, FNP 06/22/20 [phone]

## 2020-06-18 NOTE — Consult Note (Addendum)
[place] Nurse Consult Note: Reason for Consult: Black eschar with surrounding erythema to left heel. See also photo taken today. Unstageable Pressure Injury.  Wound type: ischemic vs infectious, pressure Pressure Injury POA: Yes Measurement: Black eschar that is 90% obscuring wound. Lateral erythema, warmth Wound bed:As noted above. Drainage (amount, consistency, odor) Scant serosanguinous Periwound: As noted above Dressing procedure/placement/frequency: I have implemented a conservative POC using a soap and water cleanse with rinse, followed by gentle pat dry.  Topical care will be with a betadine swabstick and allow to air dry. Top with a dry dressing and secure with a few turns of roll gauze.  Place foot into [place] boot for off loading. Vascular Surgery has been simultaneously consulted.  I will defer to that MD for a definitive POC.  [place] nursing team will not follow, but will remain available to this patient, the nursing and medical teams.  Please re-consult if needed. Thanks, Maudie Flakes, MSN, RN, [place], [facility]  Pager# [phone]

## 2020-06-18 NOTE — ED Notes (Signed)
Asked pt to change into hospital gown and he states he is only here for his foot and didn't want to change.

## 2020-06-18 NOTE — ED Triage Notes (Signed)
Pt presents with bilateral foot swelling x2 days, states "Pain is always there" just increased over the last two days. UC sent pt here fro further evaluation and possible IV abx

## 2020-06-19 ENCOUNTER — Inpatient Hospital Stay (HOSPITAL_COMMUNITY): Payer: BC Managed Care – PPO

## 2020-06-19 ENCOUNTER — Encounter (HOSPITAL_COMMUNITY)
Admission: AD | Disposition: A | Discharge status: Home / Self Care | Payer: Self-pay | Source: Home / Self Care | Attending: Internal Medicine

## 2020-06-19 DIAGNOSIS — I998 Other disorder of circulatory system: Secondary | ICD-10-CM

## 2020-06-19 DIAGNOSIS — L03119 Cellulitis of unspecified part of limb: Secondary | ICD-10-CM

## 2020-06-19 DIAGNOSIS — E1169 Type 2 diabetes mellitus with other specified complication: Secondary | ICD-10-CM

## 2020-06-19 DIAGNOSIS — I70244 Atherosclerosis of native arteries of left leg with ulceration of heel and midfoot: Secondary | ICD-10-CM | POA: Diagnosis not present

## 2020-06-19 DIAGNOSIS — E11628 Type 2 diabetes mellitus with other skin complications: Secondary | ICD-10-CM

## 2020-06-19 DIAGNOSIS — L97525 Non-pressure chronic ulcer of other part of left foot with muscle involvement without evidence of necrosis: Secondary | ICD-10-CM

## 2020-06-19 DIAGNOSIS — D638 Anemia in other chronic diseases classified elsewhere: Secondary | ICD-10-CM

## 2020-06-19 HISTORY — PX: ABDOMINAL AORTOGRAM W/LOWER EXTREMITY: CATH118223

## 2020-06-19 HISTORY — PX: PERIPHERAL VASCULAR ATHERECTOMY: CATH118256

## 2020-06-19 LAB — COMPREHENSIVE METABOLIC PANEL WITH GFR
ALT: 16 U/L (ref 0–44)
AST: 14 U/L — ABNORMAL LOW (ref 15–41)
Albumin: 2.8 g/dL — ABNORMAL LOW (ref 3.5–5.0)
Alkaline Phosphatase: 97 U/L (ref 38–126)
Anion gap: 12 (ref 5–15)
BUN: 78 mg/dL — ABNORMAL HIGH (ref 8–23)
CO2: 26 mmol/L (ref 22–32)
Calcium: 8.4 mg/dL — ABNORMAL LOW (ref 8.9–10.3)
Chloride: 98 mmol/L (ref 98–111)
Creatinine, Ser: 9.06 mg/dL — ABNORMAL HIGH (ref 0.61–1.24)
GFR calc Af Amer: 6 mL/min — ABNORMAL LOW
GFR calc non Af Amer: 6 mL/min — ABNORMAL LOW
Glucose, Bld: 124 mg/dL — ABNORMAL HIGH (ref 70–99)
Potassium: 4 mmol/L (ref 3.5–5.1)
Sodium: 136 mmol/L (ref 135–145)
Total Bilirubin: 0.7 mg/dL (ref 0.3–1.2)
Total Protein: 5.4 g/dL — ABNORMAL LOW (ref 6.5–8.1)

## 2020-06-19 LAB — GLUCOSE, CAPILLARY: Glucose-Capillary: 156 mg/dL — ABNORMAL HIGH (ref 70–99)

## 2020-06-19 LAB — CBC
HCT: 30 % — ABNORMAL LOW (ref 39.0–52.0)
Hemoglobin: 10.1 g/dL — ABNORMAL LOW (ref 13.0–17.0)
MCH: 31.2 pg (ref 26.0–34.0)
MCHC: 33.7 g/dL (ref 30.0–36.0)
MCV: 92.6 fL (ref 80.0–100.0)
Platelets: 149 10*3/uL — ABNORMAL LOW (ref 150–400)
RBC: 3.24 MIL/uL — ABNORMAL LOW (ref 4.22–5.81)
RDW: 12.6 % (ref 11.5–15.5)
WBC: 8 10*3/uL (ref 4.0–10.5)
nRBC: 0 % (ref 0.0–0.2)

## 2020-06-19 LAB — CBG MONITORING, ED
Glucose-Capillary: 111 mg/dL — ABNORMAL HIGH (ref 70–99)
Glucose-Capillary: 119 mg/dL — ABNORMAL HIGH (ref 70–99)
Glucose-Capillary: 170 mg/dL — ABNORMAL HIGH (ref 70–99)
Glucose-Capillary: 186 mg/dL — ABNORMAL HIGH (ref 70–99)
Glucose-Capillary: 214 mg/dL — ABNORMAL HIGH (ref 70–99)

## 2020-06-19 LAB — MAGNESIUM: Magnesium: 1.7 mg/dL (ref 1.7–2.4)

## 2020-06-19 LAB — PHOSPHORUS: Phosphorus: 4.9 mg/dL — ABNORMAL HIGH (ref 2.5–4.6)

## 2020-06-19 SURGERY — ABDOMINAL AORTOGRAM W/LOWER EXTREMITY
Anesthesia: LOCAL

## 2020-06-19 MED ORDER — HEPARIN SODIUM (PORCINE) 1000 UNIT/ML IJ SOLN
INTRAMUSCULAR | Status: AC
  Filled 2020-06-19: qty 1

## 2020-06-19 MED ORDER — CHLORHEXIDINE GLUCONATE CLOTH 2 % EX PADS: 6.0000 | MEDICATED_PAD | Freq: Every day | CUTANEOUS | Status: DC

## 2020-06-19 MED ORDER — MIDAZOLAM HCL 2 MG/2ML IJ SOLN
INTRAMUSCULAR | Status: DC | PRN
  Administered 2020-06-19: 1 mg via INTRAVENOUS

## 2020-06-19 MED ORDER — CLOPIDOGREL BISULFATE 75 MG PO TABS
75.0000 mg | ORAL_TABLET | Freq: Every day | ORAL | Status: DC
  Administered 2020-06-20: 75 mg via ORAL
  Filled 2020-06-19: qty 1

## 2020-06-19 MED ORDER — CLOPIDOGREL BISULFATE 75 MG PO TABS: 300.0000 mg | ORAL_TABLET | Freq: Once | ORAL | Status: DC

## 2020-06-19 MED ORDER — CLOPIDOGREL BISULFATE 300 MG PO TABS
ORAL_TABLET | ORAL | Status: AC
  Filled 2020-06-19: qty 1

## 2020-06-19 MED ORDER — HEPARIN SODIUM (PORCINE) 1000 UNIT/ML IJ SOLN
INTRAMUSCULAR | Status: DC | PRN
  Administered 2020-06-19: 7000 [IU] via INTRAVENOUS

## 2020-06-19 MED ORDER — CLOPIDOGREL BISULFATE 300 MG PO TABS
ORAL_TABLET | ORAL | Status: DC | PRN
  Administered 2020-06-19: 300 mg via ORAL

## 2020-06-19 MED ORDER — FENTANYL CITRATE (PF) 100 MCG/2ML IJ SOLN
INTRAMUSCULAR | Status: DC | PRN
  Administered 2020-06-19: 25 ug via INTRAVENOUS

## 2020-06-19 MED ORDER — FENTANYL CITRATE (PF) 100 MCG/2ML IJ SOLN
INTRAMUSCULAR | Status: AC
  Filled 2020-06-19: qty 2

## 2020-06-19 MED ORDER — HEPARIN (PORCINE) IN NACL 1000-0.9 UT/500ML-% IV SOLN
INTRAVENOUS | Status: DC | PRN
  Administered 2020-06-19 (×2): 500 mL

## 2020-06-19 MED ORDER — ONDANSETRON HCL 4 MG/2ML IJ SOLN: 4.0000 mg | Freq: Four times a day (QID) | INTRAMUSCULAR | Status: DC | PRN

## 2020-06-19 MED ORDER — HEPARIN SODIUM (PORCINE) 5000 UNIT/ML IJ SOLN: 5000.0000 [IU] | Freq: Three times a day (TID) | INTRAMUSCULAR | Status: DC

## 2020-06-19 MED ORDER — IODIXANOL 320 MG/ML IV SOLN
INTRAVENOUS | Status: DC | PRN
  Administered 2020-06-19: 155 mL via INTRA_ARTERIAL

## 2020-06-19 MED ORDER — LABETALOL HCL 5 MG/ML IV SOLN: 10.0000 mg | INTRAVENOUS | Status: DC | PRN

## 2020-06-19 MED ORDER — HYDRALAZINE HCL 20 MG/ML IJ SOLN: 5.0000 mg | INTRAMUSCULAR | Status: DC | PRN

## 2020-06-19 MED ORDER — SODIUM CHLORIDE 0.9% FLUSH
3.0000 mL | Freq: Two times a day (BID) | INTRAVENOUS | Status: DC
  Administered 2020-06-19 – 2020-06-20 (×2): 3 mL via INTRAVENOUS

## 2020-06-19 MED ORDER — CLOPIDOGREL BISULFATE 75 MG PO TABS: 75.0000 mg | ORAL_TABLET | Freq: Every day | ORAL | Status: DC

## 2020-06-19 MED ORDER — LIDOCAINE HCL (PF) 1 % IJ SOLN
INTRAMUSCULAR | Status: DC | PRN
  Administered 2020-06-19: 15 mL

## 2020-06-19 MED ORDER — ASPIRIN EC 81 MG PO TBEC
81.0000 mg | DELAYED_RELEASE_TABLET | Freq: Every day | ORAL | Status: DC
  Administered 2020-06-20: 81 mg via ORAL
  Filled 2020-06-19: qty 1

## 2020-06-19 MED ORDER — ROSUVASTATIN CALCIUM 5 MG PO TABS
10.0000 mg | ORAL_TABLET | Freq: Every day | ORAL | Status: DC
  Filled 2020-06-19: qty 2

## 2020-06-19 MED ORDER — SODIUM CHLORIDE 0.9% FLUSH: 3.0000 mL | INTRAVENOUS | Status: DC | PRN

## 2020-06-19 MED ORDER — MIDAZOLAM HCL 2 MG/2ML IJ SOLN
INTRAMUSCULAR | Status: AC
  Filled 2020-06-19: qty 2

## 2020-06-19 MED ORDER — HEPARIN (PORCINE) IN NACL 1000-0.9 UT/500ML-% IV SOLN
INTRAVENOUS | Status: AC
  Filled 2020-06-19: qty 1000

## 2020-06-19 MED ORDER — ACETAMINOPHEN 325 MG PO TABS: 650.0000 mg | ORAL_TABLET | ORAL | Status: DC | PRN

## 2020-06-19 MED ORDER — LIDOCAINE HCL (PF) 1 % IJ SOLN
INTRAMUSCULAR | Status: AC
  Filled 2020-06-19: qty 30

## 2020-06-19 MED ORDER — SODIUM CHLORIDE 0.9 % IV SOLN: 250.0000 mL | INTRAVENOUS | Status: DC | PRN

## 2020-06-19 SURGICAL SUPPLY — 23 items
BALLN JADE .018 5.0 X 240 (BALLOONS) ×3
BALLN JADE .018 5.0 X 40 (BALLOONS) ×3
BALLOON JADE .018 5.0 X 240 (BALLOONS) ×2 IMPLANT
BALLOON JADE .018 5.0 X 40 (BALLOONS) ×2 IMPLANT
CATH AURYON 5FR ATHEREC 1.5 (CATHETERS) ×3 IMPLANT
CATH OMNI FLUSH 5F 65CM (CATHETERS) ×3 IMPLANT
CATH QUICKCROSS SUPP .035X90CM (MICROCATHETER) ×3 IMPLANT
CLOSURE MYNX CONTROL 6F/7F (Vascular Products) ×3 IMPLANT
DCB RANGER 5.0X200 150 (BALLOONS) ×2 IMPLANT
GUIDEWIRE ANGLED .035X150CM (WIRE) ×3 IMPLANT
KIT ENCORE 26 ADVANTAGE (KITS) ×3 IMPLANT
KIT MICROPUNCTURE NIT STIFF (SHEATH) ×3 IMPLANT
KIT PV (KITS) ×3 IMPLANT
RANGER DCB 5.0X200 150 (BALLOONS) ×3
SHEATH PINNACLE 5F 10CM (SHEATH) ×3 IMPLANT
SHEATH PINNACLE 6F 10CM (SHEATH) ×3 IMPLANT
SHEATH PINNACLE MP 6F 45CM (SHEATH) ×3 IMPLANT
SHEATH PROBE COVER 6X72 (BAG) ×6 IMPLANT
SYR MEDRAD MARK V 150ML (SYRINGE) ×3 IMPLANT
TRANSDUCER W/STOPCOCK (MISCELLANEOUS) ×3 IMPLANT
TRAY PV CATH (CUSTOM PROCEDURE TRAY) ×3 IMPLANT
WIRE SPARTACORE .014X300CM (WIRE) ×3 IMPLANT
WIRE STARTER BENTSON 035X150 (WIRE) ×3 IMPLANT

## 2020-06-19 NOTE — Op Note (Signed)
    Patient name: Duane Hall MRN: [id number] DOB: November 26, 1956 Sex: male  06/19/2020 Pre-operative Diagnosis: Critical left lower extremity ischemia with heel ulceration, end-stage renal disease Post-operative diagnosis:  Same Surgeon:  Eda Paschal. Donzetta Matters, MD Procedure Performed: 1.  Ultrasound-guided cannulation right common femoral artery 2.  Aortogram with bilateral lower extremity runoff 3.  Laser atherectomy 1.5 [place] left SFA 4.  Drug-coated balloon angioplasty with 5 mm [place] left SFA 5.  Mynx device closure right common femoral artery 6.  Moderate sedation with fentanyl and Versed for 63 minutes   Indications: 64 year old male on dialysis now has acute left heel ulceration.  He has decreased ABIs with monophasic waveforms bilaterally is indicated for angiography possible invention.  Findings: The aorta and iliac segments are free of flow-limiting stenosis.  On the right side he has multiple approximate 50% stenoses.  Dominant runoff on the right is inline via the posterior tibial artery.  On the left side which is the site of interest he has approximately 90% stenosis after the takeoff of the SFA.  He did has multiple 60 to 80% stenoses in the distal SFA.  After atherectomy and drug-coated balloon angioplasty he has 1 area of dissection proximally in the SFA this is nonflow limiting.  There is no residual stenosis.  Dominant runoff is via the posterior tibial artery which does fill the foot adequately.   Procedure:  The patient was identified in the holding area and taken to room 8.  The patient was then placed supine on the table and prepped and draped in the usual sterile fashion.  A time out was called.  Ultrasound was used to evaluate the right common femoral artery was noted to be patent and compressible.  There is anesthetized 1% lidocaine cannulated micropuncture needle followed wire and sheath.  Images saved the permanent record.  B[place]on wires placed followed by [place] sheath.   Omni catheter was placed the level of L1 aortogram was performed with above findings followed by bilateral lower extremity runoff.  We then crossed the bifurcation placed a long 6 French sheath patient was fully heparinized.  We crossed the lesion Glidewire cross catheter confirmed intraluminal access.  We placed a [place] core wire we then performed laser arthrectomy of the entire SFA.  This was then predilated with a 5 mm balloon at nominal pressure.  We then postdilated for 3 minutes and 2 areas with a long drug-coated balloon at nominal pressure.  Completion demonstrated 1 area of dissection no residual stenosis.  Dissection was tacked for 3 minutes at 3 atm proximally with a 5 x 40 mm balloon.  Unfortunately at completion there was still a small residual dissection no flow limitation.  Satisfied we exchanged for short 6 French sheath put a minx device.  He tolerated procedure without any complication.   Contrast: 155cc  Davy Westmoreland C. Donzetta Matters, MD [facility] Office: [phone] Pager: [phone]

## 2020-06-19 NOTE — ED Notes (Signed)
Pt ambulated to bathroom unassisted.

## 2020-06-19 NOTE — ED Notes (Signed)
Ordered hospital bed per RN [facility]

## 2020-06-19 NOTE — ED Notes (Signed)
Breakfast tray ordered 

## 2020-06-19 NOTE — Progress Notes (Signed)
[facility] PROGRESS NOTE    Progress Note  JAFARI MCKILLOP  VPX:[id number] DOB: [birth year] DOA: 06/18/2020 PCP: Heywood Bene, PA-C     Brief Narrative:   Duane Hall is an 64 y.o. male past medical history of diabetes mellitus type 2, essential hypertension and stage renal disease secondarily to advanced diabetes comes into the ER for left heel wound, and swelling.  Assessment/Plan:   Left foot ulcer/Vascular insufficiency of extremity As per patient he relates he had ABIs done at [facility].  Has remain afebrile, no leukocytosis unlikely infected with no purulent drainage very minimal erythema surrounding the wound will continue to hold antibiotics. We will give a diet this morning.   Vascular surgery has been consulted recommended angiogram this afternoon n.p.o. after breakfast. Will need to be on aspirin a shortage.   End-stage renal disease due to diabetes and 60% glomerulosclerosis with diffuse interstitial fibrosis: With dialyzes Monday Wednesdays and Fridays. Renal has been consulted appears fluid overloaded on physical exam.  Type II diabetes mellitus ([place]) with diabetic nephropathy: At home on glipizide will hold this. He is currently n.p.o. continue sliding scale insulin.  Essential Hypertension; Continue current home regimen.  Anemia of chronic disease Likely due to renal disease, continue Aranesp nasal appointment.  DVT prophylaxis: lovenox Family Communication:none Status is: Inpatient  Remains inpatient appropriate because:Hemodynamically unstable   Dispo: The patient is from: Home              Anticipated d/c is to: Home              Anticipated d/c date is: 3 days              Patient currently is not medically stable to d/c.        Code Status:     Code Status Orders  (From admission, onward)         Start     Ordered   06/18/20 2132  Full code  Continuous        06/18/20 2131        Code Status History    Date Active  Date Inactive Code Status Order ID Comments User Context   09/08/2019 2251 09/14/2019 2244 Full Code [id number]  Shela Leff, MD ED   Advance Care Planning Activity        IV Access:    Peripheral IV   Procedures and diagnostic studies:   DG Foot Complete Left  Result Date: 06/18/2020 CLINICAL DATA:  64 year old male with left foot pain. EXAM: LEFT FOOT - COMPLETE 3+ VIEW COMPARISON:  None. FINDINGS: There is no acute fracture or dislocation. The bones are osteopenic. No bone erosion or periosteal elevation to suggest acute osteomyelitis. There is diffuse soft tissue swelling of the dorsum of the foot primarily involving the forefoot. No radiopaque foreign object or soft tissue gas. Vascular calcification. IMPRESSION: No acute fracture or dislocation. Electronically Signed   By: Anner Crete M.D.   On: 06/18/2020 18:52     Medical Consultants:    None.  Anti-Infectives:   none  Subjective:    DRAYK HUMBARGER relates no change in his pain, he is hungry this morning.  Objective:    Vitals:   06/19/20 0205 06/19/20 0300 06/19/20 0500 06/19/20 0600  BP: 105/61 119/63 124/60 107/68  Pulse: 80 84 88 93  Resp: (!) 21 16 19 18   Temp:      TempSrc:      SpO2: 99% 99% 98% 97%  Weight:      Height:       SpO2: 97 %   Intake/Output Summary (Last 24 hours) at 06/19/2020 0704 Last data filed at 06/18/2020 2033 Gross per 24 hour  Intake 200 ml  Output --  Net 200 ml   Filed Weights   06/18/20 1542  Weight: 68 kg    Exam: General exam: In no acute distress. Respiratory system: Good air movement and clear to auscultation. Cardiovascular system: S1 & S2 heard, RRR. No JVD, murmurs, rubs, gallops or clicks.  Gastrointestinal system: Abdomen is nondistended, soft and nontender.  Central nervous system: Alert and oriented. No focal neurological deficits. Extremities: No pedal edema. Skin: Left heel ulcer skin is erythematous looks more vascular changes, no  purulent drainage no warmth to touch    Data Reviewed:    Labs: Basic Metabolic Panel: Recent Labs  Lab 06/18/20 1600 06/19/20 0503  NA 136 136  K 3.9 4.0  CL 97* 98  CO2 24 26  GLUCOSE 194* 124*  BUN 70* 78*  CREATININE 8.12* 9.06*  CALCIUM 8.8* 8.4*  MG  --  1.7  PHOS  --  4.9*   GFR Estimated Creatinine Clearance: 8.1 mL/min (A) (by C-G formula based on SCr of 9.06 mg/dL (H)). Liver Function Tests: Recent Labs  Lab 06/19/20 0503  AST 14*  ALT 16  ALKPHOS 97  BILITOT 0.7  PROT 5.4*  ALBUMIN 2.8*   No results for input(s): LIPASE, AMYLASE in the last 168 hours. No results for input(s): AMMONIA in the last 168 hours. Coagulation profile No results for input(s): INR, PROTIME in the last 168 hours. COVID-19 Labs  No results for input(s): DDIMER, FERRITIN, LDH, CRP in the last 72 hours.  Lab Results  Component Value Date   SARSCOV2NAA NEGATIVE 06/18/2020   [place] NEGATIVE 09/08/2019    CBC: Recent Labs  Lab 06/18/20 1600 06/19/20 0503  WBC 11.2* 8.0  NEUTROABS 8.4*  --   HGB 11.7* 10.1*  HCT 35.1* 30.0*  MCV 96.2 92.6  PLT 171 149*   Cardiac Enzymes: No results for input(s): CKTOTAL, CKMB, CKMBINDEX, TROPONINI in the last 168 hours. BNP (last 3 results) No results for input(s): PROBNP in the last 8760 hours. CBG: Recent Labs  Lab 06/18/20 2200 06/19/20 0047 06/19/20 0501  GLUCAP 216* 170* 111*   D-Dimer: No results for input(s): DDIMER in the last 72 hours. Hgb A1c: No results for input(s): HGBA1C in the last 72 hours. Lipid Profile: No results for input(s): CHOL, HDL, LDLCALC, TRIG, CHOLHDL, LDLDIRECT in the last 72 hours. Thyroid function studies: No results for input(s): TSH, T4TOTAL, T3FREE, THYROIDAB in the last 72 hours.  Invalid input(s): FREET3 Anemia work up: No results for input(s): VITAMINB12, FOLATE, FERRITIN, TIBC, IRON, RETICCTPCT in the last 72 hours. Sepsis Labs: Recent Labs  Lab 06/18/20 1600 06/18/20 1912  06/19/20 0503  WBC 11.2*  --  8.0  LATICACIDVEN  --  0.9  --    Microbiology Recent Results (from the past 240 hour(s))  SARS Coronavirus 2 by RT PCR (hospital order, performed in [facility] hospital lab) Nasopharyngeal Nasopharyngeal Swab     Status: None   Collection Time: 06/18/20  6:56 PM   Specimen: Nasopharyngeal Swab  Result Value Ref Range Status   SARS Coronavirus 2 NEGATIVE NEGATIVE Final    Comment: (NOTE) SARS-CoV-2 target nucleic acids are NOT DETECTED.  The SARS-CoV-2 RNA is generally detectable in upper and lower respiratory specimens during the acute phase of infection.  The lowest concentration of SARS-CoV-2 viral copies this assay can detect is 250 copies / mL. A negative result does not preclude SARS-CoV-2 infection and should not be used as the sole basis for treatment or other patient management decisions.  A negative result may occur with improper specimen collection / handling, submission of specimen other than nasopharyngeal swab, presence of viral mutation(s) within the areas targeted by this assay, and inadequate number of viral copies (<250 copies / mL). A negative result must be combined with clinical observations, patient history, and epidemiological information.  Fact Sheet for Patients:   StrictlyIdeas.no  Fact Sheet for Healthcare Providers: BankingDealers.co.za  This test is not yet approved or  cleared by the [place] FDA and has been authorized for detection and/or diagnosis of SARS-CoV-2 by FDA under an Emergency Use Authorization (EUA).  This EUA will remain in effect (meaning this test can be used) for the duration of the COVID-19 declaration under Section 564(b)(1) of the Act, 21 U.S.C. section 360bbb-3(b)(1), unless the authorization is terminated or revoked sooner.  Performed at [facility], [place] [address], [place], [place] [address]      Medications:   . ferric citrate   420 mg Oral TID WC  . gabapentin  300 mg Oral Daily  . insulin aspart  0-6 Units Subcutaneous Q4H  . lidocaine-prilocaine  1 application Topical Once per day on Sun Mon Wed Fri  . multivitamin  1 tablet Oral Daily  . mupirocin ointment  1 application Topical Once per day on Sun Mon Wed Fri  . pantoprazole  40 mg Oral Daily   Continuous Infusions:    LOS: 1 day   Charlynne Cousins  [facility]  06/19/2020, 7:04 AM

## 2020-06-19 NOTE — Progress Notes (Signed)
ABI's have been completed. Preliminary results can be found in CV Proc through chart review.   06/19/20 11:13 AM Duane Hall RVT

## 2020-06-19 NOTE — ED Notes (Signed)
Meal Tray delivered.

## 2020-06-19 NOTE — Consult Note (Signed)
Hospital Consult    Reason for Consult: Left heel ulcer Referring Physician: Dr. Aileen Fass MRN #:  [id number]  History of Present Illness: This is a 64 y.o. male history of end-stage renal disease dialyzes via right arm fistula Mondays Wednesdays and Fridays.  No previous history of vascular insufficiency apparently did have ABIs at [facility] these are not available to [place].  Patient does not take any antiplatelets or anticoagulation.  States he has a few day history of left foot heel ulceration.  Patient does walk.  He has never had vascular invention in the past.  Past Medical History:  Diagnosis Date  . Asthma   . Cancer ([place])   . Cataract   . CKD (chronic kidney disease), stage III   . Diabetes mellitus   . Glaucoma     Past Surgical History:  Procedure Laterality Date  . AV FISTULA PLACEMENT Right 09/14/2019   Procedure: Right Arm Basilic Vein transposition;  Surgeon: Angelia Mould, MD;  Location: [place];  Service: Vascular;  Laterality: Right;  . COLONOSCOPY    . UPPER GASTROINTESTINAL ENDOSCOPY  02/2019   Dr Benson [place]      No Known Allergies  Prior to Admission medications   Medication Sig Start Date End Date Taking? Authorizing Provider  ferric citrate (AURYXIA) 1 GM 210 MG(Fe) tablet Take 420 mg by mouth 3 (three) times daily with meals.   Yes [provider]  gabapentin (NEURONTIN) 100 MG capsule Take 300 mg by mouth daily. 05/16/20  Yes [provider]  glipiZIDE (GLUCOTROL) 10 MG tablet Take 10 mg by mouth 2 (two) times daily. 04/07/20  Yes [provider]  lidocaine-prilocaine (EMLA) cream Apply 1 application topically See admin instructions. Apply topically to port access one hour prior to dialysis on Sunday, Monday, Wednesday, Thursday 05/12/20  Yes [provider]  multivitamin (RENA-VIT) TABS tablet Take 1 tablet by mouth daily.   Yes [provider]  mupirocin ointment (BACTROBAN) 2 % Apply 1 application topically See  admin instructions. Apply topically to port access after dialysis - Sunday, Monday, Wednesday, Thursday 05/17/20  Yes [provider]  omeprazole (PRILOSEC) 40 MG capsule Take 40 mg by mouth daily.   Yes [provider]    Social History   Socioeconomic History  . Marital status: Married    Spouse name: Not on file  . Number of children: Not on file  . Years of education: Not on file  . Highest education level: Not on file  Occupational History  . Occupation: Chief Financial Officer  Tobacco Use  . Smoking status: Current Every Day Smoker    Packs/day: 0.80    Types: Cigarettes  . Smokeless tobacco: Never Used  Vaping Use  . Vaping Use: Never used  Substance and Sexual Activity  . Alcohol use: No    Alcohol/week: 0.0 standard drinks  . Drug use: No  . Sexual activity: Not on file  Other Topics Concern  . Not on file  Social History Narrative   Married.    Social Determinants of Health   Financial Resource Strain:   . Difficulty of Paying Living Expenses:   Food Insecurity:   . Worried About Charity fundraiser in the Last Year:   . Arboriculturist in the Last Year:   Transportation Needs:   . Film/video editor (Medical):   Marland Kitchen Lack of Transportation (Non-Medical):   Physical Activity:   . Days of Exercise per Week:   . Minutes of  Exercise per Session:   Stress:   . Feeling of Stress :   Social Connections:   . Frequency of Communication with Friends and Family:   . Frequency of Social Gatherings with Friends and Family:   . Attends Religious Services:   . Active Member of Clubs or Organizations:   . Attends Archivist Meetings:   Marland Kitchen Marital Status:   Intimate Partner Violence:   . Fear of Current or Ex-Partner:   . Emotionally Abused:   Marland Kitchen Physically Abused:   . Sexually Abused:      Family History  Problem Relation Age of Onset  . Cancer Father     ROS: Cardiovascular: []  chest pain/pressure []  palpitations []  SOB lying flat []   DOE []  pain in legs while walking []  pain in legs at rest []  pain in legs at night []  non-healing ulcers []  hx of DVT []  swelling in legs  Pulmonary: []  productive cough []  asthma/wheezing []  home O2  Neurologic: []  weakness in []  arms []  legs []  numbness in []  arms []  legs []  hx of CVA []  mini stroke [] difficulty speaking or slurred speech []  temporary loss of vision in one eye []  dizziness  Hematologic: []  hx of cancer []  bleeding problems []  problems with blood clotting easily  Endocrine:   [x]  diabetes []  thyroid disease  GI []  vomiting blood []  blood in stool  GU: []  CKD/renal failure []  HD--[]  M/W/F or []  T/T/S []  burning with urination []  blood in urine  Psychiatric: []  anxiety []  depression  Musculoskeletal: []  arthritis []  joint pain  Integumentary: []  rashes [x]  ulcers  Constitutional: []  fever []  chills   Physical Examination  Vitals:   06/19/20 0500 06/19/20 0600  BP: 124/60 107/68  Pulse: 88 93  Resp: 19 18  Temp:    SpO2: 98% 97%   Body mass index is 22.15 kg/m.  General:  WDWN in NAD HENT: WNL, normocephalic Pulmonary: normal non-labored breathing Cardiac: Palpable bilateral common femoral and popliteal pulses Abdomen: soft, NT/ND, no masses Extremities: Left heel with ulceration and surrounding erythema Musculoskeletal: Right upper extremity strong thrill   CBC    Component Value Date/Time   WBC 8.0 06/19/2020 0503   RBC 3.24 (L) 06/19/2020 0503   HGB 10.1 (L) 06/19/2020 0503   HCT 30.0 (L) 06/19/2020 0503   PLT 149 (L) 06/19/2020 0503   MCV 92.6 06/19/2020 0503   MCV 93.0 09/07/2013 0855   MCH 31.2 06/19/2020 0503   MCHC 33.7 06/19/2020 0503   RDW 12.6 06/19/2020 0503   LYMPHSABS 1.5 06/18/2020 1600   MONOABS 1.0 06/18/2020 1600   EOSABS 0.2 06/18/2020 1600   BASOSABS 0.0 06/18/2020 1600    BMET    Component Value Date/Time   NA 136 06/19/2020 0503   K 4.0 06/19/2020 0503   CL 98 06/19/2020 0503   CO2  26 06/19/2020 0503   GLUCOSE 124 (H) 06/19/2020 0503   BUN 78 (H) 06/19/2020 0503   CREATININE 9.06 (H) 06/19/2020 0503   CREATININE 2.22 (H) 09/07/2013 0845   CALCIUM 8.4 (L) 06/19/2020 0503   GFRNONAA 6 (L) 06/19/2020 0503   GFRAA 6 (L) 06/19/2020 0503    COAGS: Lab Results  Component Value Date   INR 1.1 09/14/2019   INR 1.1 09/10/2019   INR 1.1 09/09/2019     Non-Invasive Vascular Imaging:   ABI pending   ASSESSMENT/PLAN: This is a 64 y.o. male with history end-stage renal disease.  Now has acute left lower  extremity ulceration.  We will plan for aortogram with possible invention.  Likely has tibial disease.  He can have breakfast and then needs to be n.p.o. as he will be the last case of the day.  Covid test is negative.  Grenda Lora C. Donzetta Matters, MD [facility] Office: [phone] Pager: [phone]

## 2020-06-19 NOTE — Consult Note (Addendum)
[facility] Renal Consultation Note    Indication for Consultation:  Management of ESRD/hemodialysis, anemia, hypertension/volume, and secondary hyperparathyroidism.  HPI: Duane Hall is a 64 y.o. male with PMH including ESRD on dialysis, T2DM, and HTN who presented to the ED on 06/18/20 with new L heel ulcer. Patient reports increased pain and swelling in his feet over the past several days. No known history of PVD. He was seen by Dr. Donzetta Matters in the ED who recommended aortogram. In the ED, VSS, WBC 8.0, Hgb 10.1, K+ 4.0, BUN 78, Cr 9.06.   Patient dialyzes at home 4 days per week on Nx[facility]- fairly new to HD. He reports he typically runs Sunday, Monday, Wednesday, Thursday and last HD was on Thursday due to his ED visit. Reports home hemo has been going very well. He typically dialyzes for 3 hours with net UF between 1-3L. He reports he did not have any lower extremity edema until the ulcer in his heel started. Denies SOB, orthopnea, CP, palpitations, dizziness, abdominal pain, N/V/D. Appetite has been poor for the past several days and he is currently below his EDW of 70kg.   Past Medical History:  Diagnosis Date  . Asthma   . Cancer ([place])   . Cataract   . CKD (chronic kidney disease), stage III   . Diabetes mellitus   . Glaucoma    Past Surgical History:  Procedure Laterality Date  . AV FISTULA PLACEMENT Right 09/14/2019   Procedure: Right Arm Basilic Vein transposition;  Surgeon: Angelia Mould, MD;  Location: [facility];  Service: Vascular;  Laterality: Right;  . COLONOSCOPY    . UPPER GASTROINTESTINAL ENDOSCOPY  02/2019   Dr Benson [place]     Family History  Problem Relation Age of Onset  . Cancer Father    Social History:  reports that he has been smoking cigarettes. He has been smoking about 0.80 packs per day. He has never used smokeless tobacco. He reports that he does not drink alcohol and does not use drugs.  ROS: As per HPI otherwise negative.  Physical  Exam: Vitals:   06/19/20 0500 06/19/20 0600 06/19/20 0800 06/19/20 0815  BP: 124/60 107/68 119/62 122/67  Pulse: 88 93 89 87  Resp: 19 18 16 18   Temp:      TempSrc:      SpO2: 98% 97% 97% 98%  Weight:      Height:         General: Well developed, well nourished, in no acute distress. Head: Normocephalic, atraumatic, sclera non-icteric, mucus membranes are moist. Neck: JVD not elevated. Lungs: Clear bilaterally to auscultation without wheezes, rales, or rhonchi. Breathing is unlabored. Heart: RRR with normal S1, S2. No murmurs, rubs, or gallops appreciated. Abdomen: Soft, non-tender, non-distended with normoactive bowel sounds. No rebound/guarding. No obvious abdominal masses. Musculoskeletal:  Strength and tone appear normal for age. Lower extremities: 1+ pitting edema b/l lower extremities, erythema noted L foot and ankle Neuro: Alert and oriented X 3. Moves all extremities spontaneously. Psych:  Responds to questions appropriately with a normal affect. Dialysis Access: RUE AVF + bruit/thrill  No Known Allergies Prior to Admission medications   Medication Sig Start Date End Date Taking? Authorizing Provider  ferric citrate (AURYXIA) 1 GM 210 MG(Fe) tablet Take 420 mg by mouth 3 (three) times daily with meals.   Yes [provider]  gabapentin (NEURONTIN) 100 MG capsule Take 300 mg by mouth daily. 05/16/20  Yes [provider]  glipiZIDE (GLUCOTROL) 10 MG  tablet Take 10 mg by mouth 2 (two) times daily. 04/07/20  Yes [provider]  lidocaine-prilocaine (EMLA) cream Apply 1 application topically See admin instructions. Apply topically to port access one hour prior to dialysis on Sunday, Monday, Wednesday, Thursday 05/12/20  Yes [provider]  multivitamin (RENA-VIT) TABS tablet Take 1 tablet by mouth daily.   Yes [provider]  mupirocin ointment (BACTROBAN) 2 % Apply 1 application topically See admin instructions. Apply topically to port  access after dialysis - Sunday, Monday, Wednesday, Thursday 05/17/20  Yes [provider]  omeprazole (PRILOSEC) 40 MG capsule Take 40 mg by mouth daily.   Yes [provider]   Current Facility-Administered Medications  Medication Dose Route Frequency Provider Last Rate Last Admin  . ferric citrate (AURYXIA) tablet 420 mg  420 mg Oral TID WC Fair, Marin Shutter, MD   420 mg at 06/19/20 1022  . gabapentin (NEURONTIN) capsule 300 mg  300 mg Oral Daily Fair, Marin Shutter, MD   300 mg at 06/19/20 1022  . insulin aspart (novoLOG) injection 0-6 Units  0-6 Units Subcutaneous Q4H Chauncey Mann, MD   2 Units at 06/19/20 1234  . lidocaine-prilocaine (EMLA) cream 1 application  1 application Topical Once per day on Sun Mon Wed Fri Fair, Chelsea N, MD      . multivitamin (RENA-VIT) tablet 1 tablet  1 tablet Oral Daily Chauncey Mann, MD   1 tablet at 06/19/20 1022  . mupirocin ointment (BACTROBAN) 2 % 1 application  1 application Topical Once per day on Sun Mon Wed Fri Fair, Chelsea N, MD      . pantoprazole (PROTONIX) EC tablet 40 mg  40 mg Oral Daily Chauncey Mann, MD   40 mg at 06/19/20 1022   Current Outpatient Medications  Medication Sig Dispense Refill  . ferric citrate (AURYXIA) 1 GM 210 MG(Fe) tablet Take 420 mg by mouth 3 (three) times daily with meals.    . gabapentin (NEURONTIN) 100 MG capsule Take 300 mg by mouth daily.    Marland Kitchen glipiZIDE (GLUCOTROL) 10 MG tablet Take 10 mg by mouth 2 (two) times daily.    Marland Kitchen lidocaine-prilocaine (EMLA) cream Apply 1 application topically See admin instructions. Apply topically to port access one hour prior to dialysis on Sunday, Monday, Wednesday, Thursday    . multivitamin (RENA-VIT) TABS tablet Take 1 tablet by mouth daily.    . mupirocin ointment (BACTROBAN) 2 % Apply 1 application topically See admin instructions. Apply topically to port access after dialysis - Sunday, Monday, Wednesday, Thursday    . omeprazole (PRILOSEC) 40 MG capsule Take 40  mg by mouth daily.     Labs: Basic Metabolic Panel: Recent Labs  Lab 06/18/20 1600 06/19/20 0503  NA 136 136  K 3.9 4.0  CL 97* 98  CO2 24 26  GLUCOSE 194* 124*  BUN 70* 78*  CREATININE 8.12* 9.06*  CALCIUM 8.8* 8.4*  PHOS  --  4.9*   Liver Function Tests: Recent Labs  Lab 06/19/20 0503  AST 14*  ALT 16  ALKPHOS 97  BILITOT 0.7  PROT 5.4*  ALBUMIN 2.8*   CBC: Recent Labs  Lab 06/18/20 1600 06/19/20 0503  WBC 11.2* 8.0  NEUTROABS 8.4*  --   HGB 11.7* 10.1*  HCT 35.1* 30.0*  MCV 96.2 92.6  PLT 171 149*   CBG: Recent Labs  Lab 06/18/20 2200 06/19/20 0047 06/19/20 0501 06/19/20 0738 06/19/20 1226  GLUCAP 216* 170* 111* 186* 214*  Studies/Results: DG Foot Complete Left  Result Date: 06/18/2020 CLINICAL DATA:  64 year old male with left foot pain. EXAM: LEFT FOOT - COMPLETE 3+ VIEW COMPARISON:  None. FINDINGS: There is no acute fracture or dislocation. The bones are osteopenic. No bone erosion or periosteal elevation to suggest acute osteomyelitis. There is diffuse soft tissue swelling of the dorsum of the foot primarily involving the forefoot. No radiopaque foreign object or soft tissue gas. Vascular calcification. IMPRESSION: No acute fracture or dislocation. Electronically Signed   By: Anner Crete M.D.   On: 06/18/2020 18:52   VAS [place] ABI WITH/WO TBI  Result Date: 06/19/2020 LOWER EXTREMITY DOPPLER STUDY Indications: Claudication, and ulceration. High Risk Factors: Hypertension, Diabetes.  Comparison Study: No prior studies. Performing Technologist: Carlos Levering RVT  Examination Guidelines: A complete evaluation includes at minimum, Doppler waveform signals and systolic blood pressure reading at the level of bilateral brachial, anterior tibial, and posterior tibial arteries, when vessel segments are accessible. Bilateral testing is considered an integral part of a complete examination. Photoelectric Plethysmograph (PPG) waveforms and toe systolic pressure  readings are included as required and additional duplex testing as needed. Limited examinations for reoccurring indications may be performed as noted.  ABI Findings: +---------+------------------+-----+----------+---------+ Right    Rt Pressure (mmHg)IndexWaveform  Comment   +---------+------------------+-----+----------+---------+ Brachial                                  HD Access +---------+------------------+-----+----------+---------+ PTA      113               0.85 monophasic          +---------+------------------+-----+----------+---------+ DP       108               0.81 monophasic          +---------+------------------+-----+----------+---------+ Great Toe86                0.65                     +---------+------------------+-----+----------+---------+ +--------+------------------+-----+----------+-------+ Left    Lt Pressure (mmHg)IndexWaveform  Comment +--------+------------------+-----+----------+-------+ [id number]                    triphasic         +--------+------------------+-----+----------+-------+ PTA     94                0.71 monophasic        +--------+------------------+-----+----------+-------+ DP      85                0.64 monophasic        +--------+------------------+-----+----------+-------+ +-------+-----------+-----------+------------+------------+ ABI/TBIToday's ABIToday's TBIPrevious ABIPrevious TBI +-------+-----------+-----------+------------+------------+ Right  0.85       0.65                                +-------+-----------+-----------+------------+------------+ Left   0.71                                           +-------+-----------+-----------+------------+------------+  Summary: Right: Resting right ankle-brachial index indicates mild right lower extremity arterial disease. Left: Resting left ankle-brachial index indicates moderate left lower extremity arterial disease. The left toe-brachial  index is abnormal. Unable to  obtain TBI due to low amplitude and inconsistent waveforms.  *See table(s) above for measurements and observations.    Preliminary     Dialysis Orders:  Center: [facility] (Dr. Joelyn Oms) NxStage 4x week. CAR 170, 2K, 45 lactate, BFR 4500, EDW 70kg, AVF 15g needles Heparin 3000 unit bolus Auryxia 2 tabs TID with meals  Recent labs 06/01/20: Hgb 12.1, Tsat 33%, K+ 4.0, Alb 3.9, Ca 8.8, PTH 246, Cr 5.96   Assessment/Plan: 1.  L foot ulceration: History of diabetes, seen by vascular surgery and planned for aortogram with possible intervention.  2.  ESRD:  Home hemodialysis patient, [facility] 4 days per week. Last HD was Thursday. He is moderately volume overloaded on exam and has likely lost some body weight. Will plan HD today around vascular procedure schedule and tenatively plan for MWF schedule while admitted via AVF.  3.  Hypertension/volume: BP controlled. Volume overloaded as above. Will plan for HD today with UFG 2-2.5L as tolerated. May need new EDW, follow weights post- HD. 4.  Anemia: Hgb 11.7 > 10.4. Not on outpatient ESA. Recent tsat at goal.  5.  Metabolic bone disease: Calcium and phos at goal. Continue auryxia once no longer NPO. 6.  Nutrition:  Currently NPO for procedure. Alb decreased compared to a few weeks ago. Start protein supplements once eating. 7. T2DM: per primary  Anice Paganini, PA-C 06/19/2020, 12:35 PM  [facility] Pager: [phone]  Patient seen and examined, agree with above note with above modifications. 64 year old WM pretty new start to home HD-  Going well. Presents with bilat edema and left heel pain.  Plan is for arteriogram to LE's-  Has not had HD since Thursday so will plan to do treatment today -  Around his procedure and tentatively do MWF while here.  Dialysis related labs are all reasonable  Corliss Parish, MD 06/19/2020

## 2020-06-19 NOTE — ED Notes (Signed)
MS  Kathrine wife [phone] looking for an update on the pt

## 2020-06-20 ENCOUNTER — Encounter (HOSPITAL_COMMUNITY): Payer: Self-pay | Admitting: Vascular Surgery

## 2020-06-20 ENCOUNTER — Ambulatory Visit: Payer: BC Managed Care – PPO | Admitting: Podiatry

## 2020-06-20 LAB — GLUCOSE, CAPILLARY: Glucose-Capillary: 163 mg/dL — ABNORMAL HIGH (ref 70–99)

## 2020-06-20 MED ORDER — CHLORHEXIDINE GLUCONATE CLOTH 2 % EX PADS: 6.0000 | MEDICATED_PAD | Freq: Every day | CUTANEOUS | Status: DC

## 2020-06-20 MED ORDER — ASPIRIN 81 MG PO TBEC: 81.0000 mg | DELAYED_RELEASE_TABLET | Freq: Every day | ORAL | 11 refills | Status: AC

## 2020-06-20 MED ORDER — PRO-STAT SUGAR FREE PO LIQD
30.0000 mL | Freq: Two times a day (BID) | ORAL | Status: DC
  Filled 2020-06-20: qty 30

## 2020-06-20 MED ORDER — CLOPIDOGREL BISULFATE 75 MG PO TABS: 75.0000 mg | ORAL_TABLET | Freq: Every day | ORAL | 3 refills | Status: AC

## 2020-06-20 MED ORDER — ROSUVASTATIN CALCIUM 10 MG PO TABS: 10.0000 mg | ORAL_TABLET | Freq: Every day | ORAL | 0 refills | Status: DC

## 2020-06-20 NOTE — Plan of Care (Signed)
  Problem: Safety: Goal: Ability to remain free from injury will improve Outcome: Progressing   Problem: Pain Managment: Goal: General experience of comfort will improve Outcome: Progressing   Problem: Coping: Goal: Level of anxiety will decrease Outcome: Progressing   

## 2020-06-20 NOTE — Plan of Care (Signed)
  Problem: Education: Goal: Knowledge of General Education information will improve Description: Including pain rating scale, medication(s)/side effects and non-pharmacologic comfort measures 06/20/2020 1011 by Threasa Beards, RN Outcome: Adequate for Discharge 06/20/2020 0834 by Threasa Beards, RN Outcome: Progressing   Problem: Health Behavior/Discharge Planning: Goal: Ability to manage health-related needs will improve 06/20/2020 1011 by Threasa Beards, RN Outcome: Adequate for Discharge 06/20/2020 0834 by Threasa Beards, RN Outcome: Progressing   Problem: Clinical Measurements: Goal: Ability to maintain clinical measurements within normal limits will improve 06/20/2020 1011 by Threasa Beards, RN Outcome: Adequate for Discharge 06/20/2020 0834 by Threasa Beards, RN Outcome: Progressing Goal: Will remain free from infection 06/20/2020 1011 by Threasa Beards, RN Outcome: Adequate for Discharge 06/20/2020 0834 by Threasa Beards, RN Outcome: Progressing Goal: Diagnostic test results will improve 06/20/2020 1011 by Threasa Beards, RN Outcome: Adequate for Discharge 06/20/2020 0834 by Threasa Beards, RN Outcome: Progressing Goal: Respiratory complications will improve 06/20/2020 1011 by Threasa Beards, RN Outcome: Adequate for Discharge 06/20/2020 0834 by Threasa Beards, RN Outcome: Progressing Goal: Cardiovascular complication will be avoided 06/20/2020 1011 by Threasa Beards, RN Outcome: Adequate for Discharge 06/20/2020 0834 by Threasa Beards, RN Outcome: Progressing   Problem: Activity: Goal: Risk for activity intolerance will decrease 06/20/2020 1011 by Threasa Beards, RN Outcome: Adequate for Discharge 06/20/2020 0834 by Threasa Beards, RN Outcome: Progressing   Problem: Nutrition: Goal: Adequate nutrition will be maintained 06/20/2020 1011 by Threasa Beards, RN Outcome: Adequate for Discharge 06/20/2020 0834 by Threasa Beards, RN Outcome: Progressing   Problem: Coping: Goal: Level of anxiety will decrease 06/20/2020 1011 by Threasa Beards, RN Outcome: Adequate for Discharge 06/20/2020 0834 by Threasa Beards, RN Outcome: Progressing   Problem: Elimination: Goal: Will not experience complications related to bowel motility 06/20/2020 1011 by Threasa Beards, RN Outcome: Adequate for Discharge 06/20/2020 0834 by Threasa Beards, RN Outcome: Progressing Goal: Will not experience complications related to urinary retention 06/20/2020 1011 by Threasa Beards, RN Outcome: Adequate for Discharge 06/20/2020 0834 by Threasa Beards, RN Outcome: Progressing   Problem: Pain Managment: Goal: General experience of comfort will improve 06/20/2020 1011 by Threasa Beards, RN Outcome: Adequate for Discharge 06/20/2020 0834 by Threasa Beards, RN Outcome: Progressing   Problem: Safety: Goal: Ability to remain free from injury will improve 06/20/2020 1011 by Threasa Beards, RN Outcome: Adequate for Discharge 06/20/2020 0834 by Threasa Beards, RN Outcome: Progressing   Problem: Skin Integrity: Goal: Risk for impaired skin integrity will decrease 06/20/2020 1011 by Threasa Beards, RN Outcome: Adequate for Discharge 06/20/2020 0834 by Threasa Beards, RN Outcome: Progressing

## 2020-06-20 NOTE — Discharge Instructions (Signed)
° °  [facility] ° °Discharge Instructions ° °Lower Extremity Angiogram; Angioplasty/Stenting ° °Please refer to the following instructions for your post-procedure care. Your surgeon or physician assistant will discuss any changes with you. ° °Activity ° °Avoid lifting more than 8 pounds (1 gallons of milk) for 72 hours (3 days) after your procedure. You may walk as much as you can tolerate. It's OK to drive after 72 hours. ° °Bathing/Showering ° °You may shower the day after your procedure. If you have a bandage, you may remove it at 24- 48 hours. Clean your incision site with mild soap and water. Pat the area dry with a clean towel. ° °Diet ° °Resume your pre-procedure diet. There are no special food restrictions following this procedure. All patients with peripheral vascular disease should follow a low fat/low cholesterol diet. In order to heal from your surgery, it is CRITICAL to get adequate nutrition. Your body requires vitamins, minerals, and protein. Vegetables are the best source of vitamins and minerals. Vegetables also provide the perfect balance of protein. Processed food has little nutritional value, so try to avoid this. ° °Medications ° °Resume taking all of your medications unless your doctor tells you not to. If your incision is causing pain, you may take over-the-counter pain relievers such as acetaminophen (Tylenol) ° °Follow Up ° °Follow up will be arranged at the time of your procedure. You may have an office visit scheduled or may be scheduled for surgery. Ask your surgeon if you have any questions. ° °Please call us immediately for any of the following conditions: °•Severe or worsening pain your legs or feet at rest or with walking. °•Increased pain, redness, drainage at your groin puncture site. °•Fever of 101 degrees or higher. °•If you have any mild or slow bleeding from your puncture site: lie down, apply firm constant pressure over the area with a piece of  gauze or a clean wash cloth for 30 minutes- no peeking!, call 911 right away if you are still bleeding after 30 minutes, or if the bleeding is heavy and unmanageable. ° °Reduce your risk factors of vascular disease: ° °Stop smoking. If you would like help call [facility] at 1-800-QUIT-NOW (1-800-784-8669) or  at 336-586-4000. °Manage your cholesterol °Maintain a desired weight °Control your diabetes °Keep your blood pressure down ° °If you have any questions, please call the office at [phone] ° °

## 2020-06-20 NOTE — Discharge Summary (Signed)
Physician Discharge Summary  Duane Hall [facility] GBE:[id number] DOB: [birth year] DOA: 06/18/2020  PCP: Heywood Bene, PA-C  Admit date: 06/18/2020 Discharge date: 06/20/2020  Admitted From: Home Disposition:  Home  Recommendations for Outpatient Follow-up:  1. Follow up with PCP in 1-2 weeks 2. Please obtain BMP/CBC in one week 3.   Home Health:No Equipment/Devices:None  Discharge Condition:Stable CODE STATUS:Full Diet recommendation: Heart Healthy  Brief/Interim Summary: 64 y.o. male past medical history of diabetes mellitus type 2, essential hypertension and stage renal disease secondarily to advanced diabetes comes into the ER for left heel wound, and swelling.  Discharge Diagnoses:  Principal Problem:   Vascular insufficiency of extremity Active Problems:   Type II diabetes mellitus ([place])   Hypertension   Asthma   Anemia of chronic disease   Awaiting transplantation of kidney   Foot ulcer, left (HCC)  Left foot ulcer: He was afebrile with no leukocytosis which is unlikely infected, antibiotics were not started. Vascular surgery was consulted who performed laser endarterectomy and balloon angioplasty to the left SF a, ABIs were done which were less than 1 bilaterally. He will be sent home on aspirin, Plavix and statin. He relates he does not like medication he does not know he will take them for a year.  End-stage renal disease due to diabetes mellitus: Nephrology was consulted and he was dialyzed.  Diabetes mellitus type 2 with diabetic nephropathy: No change made to his medication.  Essential hypertension: Continue current home regimen.  Anemia of chronic disease: Likely due to renal disease continue Aranesp and IV iron as an outpatient.  Discharge Instructions  Discharge Instructions    Diet - low sodium heart healthy   Complete by: As directed    Increase activity slowly   Complete by: As directed    No wound care   Complete by: As directed       Allergies as of 06/20/2020   No Known Allergies     Medication List    TAKE these medications   aspirin 81 MG EC tablet Take 1 tablet (81 mg total) by mouth daily. Swallow whole. Start taking on: June 21, 2020   Auryxia 1 GM 210 MG(Fe) tablet Generic drug: ferric citrate Take 420 mg by mouth 3 (three) times daily with meals.   clopidogrel 75 MG tablet Commonly known as: PLAVIX Take 1 tablet (75 mg total) by mouth daily with breakfast. Start taking on: June 21, 2020   gabapentin 100 MG capsule Commonly known as: NEURONTIN Take 300 mg by mouth daily.   glipiZIDE 10 MG tablet Commonly known as: GLUCOTROL Take 10 mg by mouth 2 (two) times daily.   lidocaine-prilocaine cream Commonly known as: EMLA Apply 1 application topically See admin instructions. Apply topically to port access one hour prior to dialysis on Sunday, Monday, Wednesday, Thursday   multivitamin Tabs tablet Take 1 tablet by mouth daily.   mupirocin ointment 2 % Commonly known as: BACTROBAN Apply 1 application topically See admin instructions. Apply topically to port access after dialysis - Sunday, Monday, Wednesday, Thursday   omeprazole 40 MG capsule Commonly known as: PRILOSEC Take 40 mg by mouth daily.   rosuvastatin 10 MG tablet Commonly known as: CRESTOR Take 1 tablet (10 mg total) by mouth daily. Start taking on: June 21, 2020       Follow-up Information    Waynetta Sandy, MD Follow up.   Specialties: Vascular Surgery, Cardiology Why: The office will call to make follow-up appointment (sent) Contact information: [phone] [address] [phone]              No Known Allergies  Consultations: vascular surgery  Procedures/Studies: PERIPHERAL VASCULAR CATHETERIZATION  Result Date: 06/19/2020 Patient name: Duane Hall MRN: [id number] DOB: June 19, 1956 Sex: male 06/19/2020 Pre-operative Diagnosis: Critical left lower extremity ischemia with heel  ulceration, end-stage renal disease Post-operative diagnosis:  Same Surgeon:  Eda Paschal. Donzetta Matters, MD Procedure Performed: 1.  Ultrasound-guided cannulation right common femoral artery 2.  Aortogram with bilateral lower extremity runoff 3.  Laser atherectomy 1.5 Auryon left SFA 4.  Drug-coated balloon angioplasty with 5 mm Ranger left SFA 5.  Mynx device closure right common femoral artery 6.  Moderate sedation with fentanyl and Versed for 63 minutes Indications: 64 year old male on dialysis now has acute left heel ulceration.  He has decreased ABIs with monophasic waveforms bilaterally is indicated for angiography possible invention. Findings: The aorta and iliac segments are free of flow-limiting stenosis.  On the right side he has multiple approximate 50% stenoses.  Dominant runoff on the right is inline via the posterior tibial artery.  On the left side which is the site of interest he has approximately 90% stenosis after the takeoff of the SFA.  He did has multiple 60 to 80% stenoses in the distal SFA.  After atherectomy and drug-coated balloon angioplasty he has 1 area of dissection proximally in the SFA this is nonflow limiting.  There is no residual stenosis.  Dominant runoff is via the posterior tibial artery which does fill the foot adequately.  Procedure:  The patient was identified in the holding area and taken to room 8.  The patient was then placed supine on the table and prepped and draped in the usual sterile fashion.  A time out was called.  Ultrasound was used to evaluate the right common femoral artery was noted to be patent and compressible.  There is anesthetized 1% lidocaine cannulated micropuncture needle followed wire and sheath.  Images saved the permanent record.  Bentson wires placed followed by [place] sheath.  Omni catheter was placed the level of L1 aortogram was performed with above findings followed by bilateral lower extremity runoff.  We then crossed the bifurcation placed a long 6  French sheath patient was fully heparinized.  We crossed the lesion Glidewire cross catheter confirmed intraluminal access.  We placed a [place] core wire we then performed laser arthrectomy of the entire SFA.  This was then predilated with a 5 mm balloon at nominal pressure.  We then postdilated for 3 minutes and 2 areas with a long drug-coated balloon at nominal pressure.  Completion demonstrated 1 area of dissection no residual stenosis.  Dissection was tacked for 3 minutes at 3 atm proximally with a 5 x 40 mm balloon.  Unfortunately at completion there was still a small residual dissection no flow limitation.  Satisfied we exchanged for short 6 French sheath put a minx device.  He tolerated procedure without any complication. Contrast: 155cc Brandon C. Donzetta Matters, MD [facility] [place] Office: [phone] Pager: [phone]  DG Foot Complete Left  Result Date: 06/18/2020 CLINICAL DATA:  64 year old male with left foot pain. EXAM: LEFT FOOT - COMPLETE 3+ VIEW COMPARISON:  None. FINDINGS: There is no acute fracture or dislocation. The bones are osteopenic. No bone erosion or periosteal elevation to suggest acute osteomyelitis. There is diffuse soft tissue swelling of the dorsum of the foot primarily involving the forefoot. No radiopaque foreign object or soft tissue gas.  Vascular calcification. IMPRESSION: No acute fracture or dislocation. Electronically Signed   By: Anner Crete M.D.   On: 06/18/2020 18:52   VAS [place] ABI WITH/WO TBI  Result Date: 06/19/2020 LOWER EXTREMITY DOPPLER STUDY Indications: Claudication, and ulceration. High Risk Factors: Hypertension, Diabetes.  Comparison Study: No prior studies. Performing Technologist: Carlos Levering RVT  Examination Guidelines: A complete evaluation includes at minimum, Doppler waveform signals and systolic blood pressure reading at the level of bilateral brachial, anterior tibial, and posterior tibial arteries, when vessel segments are  accessible. Bilateral testing is considered an integral part of a complete examination. Photoelectric Plethysmograph (PPG) waveforms and toe systolic pressure readings are included as required and additional duplex testing as needed. Limited examinations for reoccurring indications may be performed as noted.  ABI Findings: +---------+------------------+-----+----------+---------+ Right    Rt Pressure (mmHg)IndexWaveform  Comment   +---------+------------------+-----+----------+---------+ Brachial                                  HD Access +---------+------------------+-----+----------+---------+ PTA      113               0.85 monophasic          +---------+------------------+-----+----------+---------+ DP       108               0.81 monophasic          +---------+------------------+-----+----------+---------+ Great Toe86                0.65                     +---------+------------------+-----+----------+---------+ +--------+------------------+-----+----------+-------+ Left    Lt Pressure (mmHg)IndexWaveform  Comment +--------+------------------+-----+----------+-------+ [id number]                    triphasic         +--------+------------------+-----+----------+-------+ PTA     94                0.71 monophasic        +--------+------------------+-----+----------+-------+ DP      85                0.64 monophasic        +--------+------------------+-----+----------+-------+ +-------+-----------+-----------+------------+------------+ ABI/TBIToday's ABIToday's TBIPrevious ABIPrevious TBI +-------+-----------+-----------+------------+------------+ Right  0.85       0.65                                +-------+-----------+-----------+------------+------------+ Left   0.71                                           +-------+-----------+-----------+------------+------------+  Summary: Right: Resting right ankle-brachial index indicates mild  right lower extremity arterial disease. Left: Resting left ankle-brachial index indicates moderate left lower extremity arterial disease. The left toe-brachial index is abnormal. Unable to obtain TBI due to low amplitude and inconsistent waveforms.  *See table(s) above for measurements and observations.  Electronically signed by Ruta Hinds MD on 06/19/2020 at 3:37:06 PM.   Final     (Echo, Carotid, EGD, Colonoscopy, ERCP)    Subjective: No complaints wants to go home  Discharge Exam: Vitals:   06/20/20 0456 06/20/20 0816  BP: (!) 76/55 (!) 121/56  Pulse:  95  Resp: 18  16  Temp: 99.2 F (37.3 C) 97.8 F (36.6 C)  SpO2: 98% 100%   Vitals:   06/20/20 0424 06/20/20 0456 06/20/20 0508 06/20/20 0816  BP: 110/65 (!) 76/55  (!) 121/56  Pulse: 88   95  Resp: _0 Temp: 99.4 F (37.4 C) 99.2 F (37.3 C)  97.8 F (36.6 C)  TempSrc: Oral Oral  Oral  SpO2:  98%  100%  Weight: 73 kg  70.4 kg   Height:        General: Pt is alert, awake, not in acute distress Cardiovascular: RRR, S1/S2 +, no rubs, no gallops Respiratory: CTA bilaterally, no wheezing, no rhonchi Abdominal: Soft, NT, ND, bowel sounds + Extremities: no edema, no cyanosis    The results of significant diagnostics from this hospitalization (including imaging, microbiology, ancillary and laboratory) are listed below for reference.     Microbiology: Recent Results (from the past 240 hour(s))  Culture, blood (Routine X 2) w Reflex to ID Panel     Status: None (Preliminary result)   Collection Time: 06/18/20  6:17 PM   Specimen: BLOOD LEFT FOREARM  Result Value Ref Range Status   Specimen Description BLOOD LEFT FOREARM  Final   Special Requests   Final    BOTTLES DRAWN AEROBIC AND ANAEROBIC Blood Culture adequate volume   Culture   Final    NO GROWTH 2 DAYS Performed at [facility], [place] [address], [place], [place] [address]    Report Status PENDING  Incomplete  Culture, blood (Routine X 2) w  Reflex to ID Panel     Status: None (Preliminary result)   Collection Time: 06/18/20  6:22 PM   Specimen: BLOOD  Result Value Ref Range Status   Specimen Description BLOOD LEFT ANTECUBITAL  Final   Special Requests   Final    BOTTLES DRAWN AEROBIC AND ANAEROBIC Blood Culture results may not be optimal due to an inadequate volume of blood received in culture bottles   Culture   Final    NO GROWTH 2 DAYS Performed at [facility], [place] [address] [place], [place] [address]    Report Status PENDING  Incomplete  SARS Coronavirus 2 by RT PCR (hospital order, performed in [facility] hospital lab) Nasopharyngeal Nasopharyngeal Swab     Status: None   Collection Time: 06/18/20  6:56 PM   Specimen: Nasopharyngeal Swab  Result Value Ref Range Status   SARS Coronavirus 2 NEGATIVE NEGATIVE Final    Comment: (NOTE) SARS-CoV-2 target nucleic acids are NOT DETECTED.  The SARS-CoV-2 RNA is generally detectable in upper and lower respiratory specimens during the acute phase of infection. The lowest concentration of SARS-CoV-2 viral copies this assay can detect is 250 copies / mL. A negative result does not preclude SARS-CoV-2 infection and should not be used as the sole basis for treatment or other patient management decisions.  A negative result may occur with improper specimen collection / handling, submission of specimen other than nasopharyngeal swab, presence of viral mutation(s) within the areas targeted by this assay, and inadequate number of viral copies (<250 copies / mL). A negative result must be combined with clinical observations, patient history, and epidemiological information.  Fact Sheet for Patients:   StrictlyIdeas.no  Fact Sheet for Healthcare Providers: BankingDealers.co.za  This test is not yet approved or  cleared by the [place] FDA and has been authorized for detection and/or diagnosis of SARS-CoV-2 by FDA under  an Emergency Use  Authorization (EUA).  This EUA will remain in effect (meaning this test can be used) for the duration of the COVID-19 declaration under Section 564(b)(1) of the Act, 21 U.S.C. section 360bbb-3(b)(1), unless the authorization is terminated or revoked sooner.  Performed at [facility], [place] [address] [place], [place] [address]      Labs: BNP (last 3 results) Recent Labs    09/08/19 1710  BNP 672.0*   Basic Metabolic Panel: Recent Labs  Lab 06/18/20 1600 06/19/20 0503  NA 136 136  K 3.9 4.0  CL 97* 98  CO2 24 26  GLUCOSE 194* 124*  BUN 70* 78*  CREATININE 8.12* 9.06*  CALCIUM 8.8* 8.4*  MG  --  1.7  PHOS  --  4.9*   Liver Function Tests: Recent Labs  Lab 06/19/20 0503  AST 14*  ALT 16  ALKPHOS 97  BILITOT 0.7  PROT 5.4*  ALBUMIN 2.8*   No results for input(s): LIPASE, AMYLASE in the last 168 hours. No results for input(s): AMMONIA in the last 168 hours. CBC: Recent Labs  Lab 06/18/20 1600 06/19/20 0503  WBC 11.2* 8.0  NEUTROABS 8.4*  --   HGB 11.7* 10.1*  HCT 35.1* 30.0*  MCV 96.2 92.6  PLT 171 149*   Cardiac Enzymes: No results for input(s): CKTOTAL, CKMB, CKMBINDEX, TROPONINI in the last 168 hours. BNP: Invalid input(s): POCBNP CBG: Recent Labs  Lab 06/19/20 0738 06/19/20 1226 06/19/20 1555 06/19/20 2348 06/20/20 0812  GLUCAP 186* 214* 119* 156* 163*   D-Dimer No results for input(s): DDIMER in the last 72 hours. Hgb A1c No results for input(s): HGBA1C in the last 72 hours. Lipid Profile No results for input(s): CHOL, HDL, LDLCALC, TRIG, CHOLHDL, LDLDIRECT in the last 72 hours. Thyroid function studies No results for input(s): TSH, T4TOTAL, T3FREE, THYROIDAB in the last 72 hours.  Invalid input(s): FREET3 Anemia work up No results for input(s): VITAMINB12, FOLATE, FERRITIN, TIBC, IRON, RETICCTPCT in the last 72 hours. Urinalysis    Component Value Date/Time   COLORURINE YELLOW 09/09/2019 0343    APPEARANCEUR CLEAR 09/09/2019 0343   LABSPEC 1.009 09/09/2019 0343   PHURINE 5.0 09/09/2019 0343   GLUCOSEU 50 (A) 09/09/2019 0343   HGBUR MODERATE (A) 09/09/2019 0343   BILIRUBINUR NEGATIVE 09/09/2019 0343   KETONESUR NEGATIVE 09/09/2019 0343   PROTEINUR >=300 (A) 09/09/2019 0343   NITRITE NEGATIVE 09/09/2019 0343   LEUKOCYTESUR NEGATIVE 09/09/2019 0343   Sepsis Labs Invalid input(s): PROCALCITONIN,  WBC,  LACTICIDVEN Microbiology Recent Results (from the past 240 hour(s))  Culture, blood (Routine X 2) w Reflex to ID Panel     Status: None (Preliminary result)   Collection Time: 06/18/20  6:17 PM   Specimen: BLOOD LEFT FOREARM  Result Value Ref Range Status   Specimen Description BLOOD LEFT FOREARM  Final   Special Requests   Final    BOTTLES DRAWN AEROBIC AND ANAEROBIC Blood Culture adequate volume   Culture   Final    NO GROWTH 2 DAYS Performed at [facility], [place] [address], [place], [place] [address]    Report Status PENDING  Incomplete  Culture, blood (Routine X 2) w Reflex to ID Panel     Status: None (Preliminary result)   Collection Time: 06/18/20  6:22 PM   Specimen: BLOOD  Result Value Ref Range Status   Specimen Description BLOOD LEFT ANTECUBITAL  Final   Special Requests   Final    BOTTLES DRAWN AEROBIC AND ANAEROBIC Blood  Culture results may not be optimal due to an inadequate volume of blood received in culture bottles   Culture   Final    NO GROWTH 2 DAYS Performed at [facility], [place] [address] [place], [place] [address]    Report Status PENDING  Incomplete  SARS Coronavirus 2 by RT PCR (hospital order, performed in [facility] hospital lab) Nasopharyngeal Nasopharyngeal Swab     Status: None   Collection Time: 06/18/20  6:56 PM   Specimen: Nasopharyngeal Swab  Result Value Ref Range Status   SARS Coronavirus 2 NEGATIVE NEGATIVE Final    Comment: (NOTE) SARS-CoV-2 target nucleic acids are NOT DETECTED.  The SARS-CoV-2 RNA is generally  detectable in upper and lower respiratory specimens during the acute phase of infection. The lowest concentration of SARS-CoV-2 viral copies this assay can detect is 250 copies / mL. A negative result does not preclude SARS-CoV-2 infection and should not be used as the sole basis for treatment or other patient management decisions.  A negative result may occur with improper specimen collection / handling, submission of specimen other than nasopharyngeal swab, presence of viral mutation(s) within the areas targeted by this assay, and inadequate number of viral copies (<250 copies / mL). A negative result must be combined with clinical observations, patient history, and epidemiological information.  Fact Sheet for Patients:   StrictlyIdeas.no  Fact Sheet for Healthcare Providers: BankingDealers.co.za  This test is not yet approved or  cleared by the [place] FDA and has been authorized for detection and/or diagnosis of SARS-CoV-2 by FDA under an Emergency Use Authorization (EUA).  This EUA will remain in effect (meaning this test can be used) for the duration of the COVID-19 declaration under Section 564(b)(1) of the Act, 21 U.S.C. section 360bbb-3(b)(1), unless the authorization is terminated or revoked sooner.  Performed at [facility], [place] [address] [place], [place] [address]      Time coordinating discharge: Over 40 minutes  SIGNED:   Charlynne Cousins, MD  [facility] 06/20/2020, 9:54 AM Pager   If 7PM-7AM, please contact night-coverage [email or web address] Password [email or web address]

## 2020-06-20 NOTE — Progress Notes (Addendum)
Progress Note    06/20/2020 7:58 AM 1 Day Post-Op  Subjective: Ambulating independently.  He is sitting on the side of his bed dressed in his denim jeans and eating breakfast.  He is inquiring about his procedure yesterday and timing of discharge.   Vitals:   06/20/20 0424 06/20/20 0456  BP: 110/65 (!) 76/55  Pulse: 88   Resp: 16 18  Temp: 99.4 F (37.4 C) 99.2 F (37.3 C)  SpO2:  98%    Physical Exam: Cardiac: Rate and rhythm are regular Lungs: Nonlabored respirations Extremities:  Left heel dressing intact.  Right groin puncture site soft without ecchymosis.  Nontender   CBC    Component Value Date/Time   WBC 8.0 06/19/2020 0503   RBC 3.24 (L) 06/19/2020 0503   HGB 10.1 (L) 06/19/2020 0503   HCT 30.0 (L) 06/19/2020 0503   PLT 149 (L) 06/19/2020 0503   MCV 92.6 06/19/2020 0503   MCV 93.0 09/07/2013 0855   MCH 31.2 06/19/2020 0503   MCHC 33.7 06/19/2020 0503   RDW 12.6 06/19/2020 0503   LYMPHSABS 1.5 06/18/2020 1600   MONOABS 1.0 06/18/2020 1600   EOSABS 0.2 06/18/2020 1600   BASOSABS 0.0 06/18/2020 1600    BMET    Component Value Date/Time   NA 136 06/19/2020 0503   K 4.0 06/19/2020 0503   CL 98 06/19/2020 0503   CO2 26 06/19/2020 0503   GLUCOSE 124 (H) 06/19/2020 0503   BUN 78 (H) 06/19/2020 0503   CREATININE 9.06 (H) 06/19/2020 0503   CREATININE 2.22 (H) 09/07/2013 0845   CALCIUM 8.4 (L) 06/19/2020 0503   GFRNONAA 6 (L) 06/19/2020 0503   GFRAA 6 (L) 06/19/2020 0503     Intake/Output Summary (Last 24 hours) at 06/20/2020 0758 Last data filed at 06/20/2020 0414 Gross per 24 hour  Intake --  Output 1406 ml  Net -1406 ml    HOSPITAL MEDICATIONS Scheduled Meds: . aspirin EC  81 mg Oral Daily  . Chlorhexidine Gluconate Cloth  6 each Topical Q0600  . clopidogrel  75 mg Oral Q breakfast  . ferric citrate  420 mg Oral TID WC  . gabapentin  300 mg Oral Daily  . heparin  5,000 Units Subcutaneous Q8H  . insulin aspart  0-6 Units Subcutaneous Q4H   . lidocaine-prilocaine  1 application Topical Once per day on Sun Mon Wed Fri  . multivitamin  1 tablet Oral Daily  . mupirocin ointment  1 application Topical Once per day on Sun Mon Wed Fri  . pantoprazole  40 mg Oral Daily  . rosuvastatin  10 mg Oral Daily  . sodium chloride flush  3 mL Intravenous Q12H   Continuous Infusions: . sodium chloride     PRN Meds:.sodium chloride, acetaminophen, hydrALAZINE, labetalol, ondansetron (ZOFRAN) IV, sodium chloride flush  Assessment: Acute left lower extremity ulceration status post laser atherectomy and balloon angioplasty of the left SFA.  Right groin puncture site without hematoma.  I explained the nature of the procedure yesterday and the patient requested a printout of the procedure note which I did.  He is okay for discharge home from vascular perspective.  Plan: -Continue daily aspirin, Plavix and statin  -Left heel wound care daily: Soap and water cleanse with rinse, pat dry.  Betadine swab stick to left heel ulcer and allow to air dry.  Place dry dressing.  Offload with Prevalon boot  -Follow-up in the office with Dr. Donzetta Matters in 2 to 3 weeks to assess left heel  wound and follow-up with noninvasive vascular imaging.  I will medicate instructions for follow-up with our office.  -DVT prophylaxis:  Heparin [place]   Risa Grill, PA-C [facility] [phone] 06/20/2020  7:58 AM   I have independently interviewed and examined patient and agree with PA assessment and plan above. He will need asa/plavix and statin for discharge and will see him back in short interval to eval wounds.  Lutie Pickler C. Donzetta Matters, MD [facility] [place] Office: [phone] Pager: [phone]

## 2020-06-20 NOTE — Progress Notes (Signed)
Patient back from Dialysis, up walking in bedroom. Patient educated of calling for assist when ambulating. Patient is non-compliant after multiple attempts of fall prevention education. Patient is wearing a una boot, unstable on his feet, and has fluctuating low spb, 70-100, post dialysis.

## 2020-06-20 NOTE — Progress Notes (Signed)
ED called about personal belongings bag with socks inside. No bag found as of now.

## 2020-06-20 NOTE — TOC Initial Note (Signed)
Transition of Care [facility]) - Initial/Assessment Note    Patient Details  Name: Duane Hall MRN: [id number] Date of Birth: December 29, 1956  Transition of Care [facility]) CM/SW Contact:    Bethena Roys, RN Phone Number: 06/20/2020, 10:38 AM  Clinical Narrative: Risk for readmission assessment completed. Patient presented for workup for vascular insufficiency. Patient has hx of ESRD- from home with support of wife. Patient does HD in his home, [facility] schedule. Patient states he gets to appointments without any problems and he gets medications as needed. No home needs identified at this time. Plan will be for patient to transition home today- wife will provide transportation home.              Expected Discharge Plan: Home/Self Care Barriers to Discharge: No Barriers Identified   Patient Goals and CMS Choice Patient states their goals for this hospitalization and ongoing recovery are:: to return home.   Choice offered to / list presented to : NA  Expected Discharge Plan and Services Expected Discharge Plan: Home/Self Care In-house Referral: NA Discharge Planning Services: CM Consult Post Acute Care Choice: NA Living arrangements for the past 2 months: Single Family Home Expected Discharge Date: 06/20/20               DME Arranged: N/A DME Agency: NA       HH Arranged: NA HH Agency: NA        Prior Living Arrangements/Services Living arrangements for the past 2 months: [place]   Patient language and need for interpreter reviewed:: Yes Do you feel safe going back to the place where you live?: Yes      Need for Family Participation in Patient Care: Yes (Comment) Care giver support system in place?: Yes (comment)   Criminal Activity/Legal Involvement Pertinent to Current Situation/Hospitalization: No - Comment as needed  Activities of Daily Living Home Assistive Devices/Equipment: Eyeglasses, Other (Comment) (home dialaysis equipment) ADL Screening  (condition at time of admission) Patient's cognitive ability adequate to safely complete daily activities?: Yes Is the patient deaf or have difficulty hearing?: No Does the patient have difficulty seeing, even when wearing glasses/contacts?: No Does the patient have difficulty concentrating, remembering, or making decisions?: No Patient able to express need for assistance with ADLs?: Yes Does the patient have difficulty dressing or bathing?: No Independently performs ADLs?: Yes (appropriate for developmental age) Does the patient have difficulty walking or climbing stairs?: Yes Weakness of Legs: Left Weakness of Arms/Hands: None  Permission Sought/Granted Permission sought to share information with : Family Supports    Emotional Assessment Appearance:: Appears stated age Attitude/Demeanor/Rapport: Engaged Affect (typically observed): Appropriate Orientation: : Oriented to Situation, Oriented to  Time, Oriented to Place, Oriented to Self Alcohol / Substance Use: Not Applicable Psych Involvement: No (comment)  Admission diagnosis:  Foot pain, left [Q00.867] Vascular insufficiency of extremity [I99.8] Patient Active Problem List   Diagnosis Date Noted   Vascular insufficiency of extremity 06/18/2020   Awaiting transplantation of kidney 06/18/2020   Foot ulcer, left ([place]) 06/18/2020   Hypokalemia 09/09/2019   Volume overload 09/08/2019   Diabetic retinopathy ([place]) 08/25/2015   Claudication ([place]) 04/07/2015   Anemia of chronic disease 09/12/2014   CKD (chronic kidney disease) 08/03/2014   Hepatitis C 10/15/2013   Pain in joint, shoulder region 02/10/2013   Type II diabetes mellitus ([place]) 02/24/2012   Hypertension 02/24/2012   Asthma 02/24/2012   PCP:  Heywood Bene, PA-C Pharmacy:   [id number] [place], [place] -  [address] [place] [address] Phone: [phone] Fax:  [phone]  [facility] [place], [place] - [address] [place] [address] Phone: [phone] Fax: [phone]   Readmission Risk Interventions Readmission Risk Prevention Plan 06/20/2020  Transportation Screening Complete  PCP or Specialist Appt within 3-5 Days Complete  HRI or Home Care Consult Complete  Social Work Consult for [place] Planning/Counseling Complete  Palliative Care Screening Not Applicable  Medication Review Press photographer) Complete  Some recent data might be hidden

## 2020-06-20 NOTE — Progress Notes (Addendum)
[facility] Progress Note   Subjective:   Pt seen in room. S/p arterectomy and angioplasty of left SFA yesterday. Had HD overnight with some hypotension mid treatment. Feeling well this AM, denies SOB, CP, dizziness, abdominal pain, N/V. Hoping to go home today.   Objective Vitals:   06/20/20 0424 06/20/20 0456 06/20/20 0508 06/20/20 0816  BP: 110/65 (!) 76/55  (!) 121/56  Pulse: 88   95  Resp: _0 Temp: 99.4 F (37.4 C) 99.2 F (37.3 C)  97.8 F (36.6 C)  TempSrc: Oral Oral  Oral  SpO2:  98%  100%  Weight: 73 kg  70.4 kg   Height:       Physical Exam General: Well developed male, alert and in NAD Heart: RRR, normal S1/S2, no murmur Lungs: CTA bilaterally without wheezing, rhonchi or rales Abdomen: Soft, non-tender, non-distended. +BS Extremities: Trace edema LLE, foot bandaged. No edema RLE Dialysis Access:  RUE AVF + bruit  Additional Objective Labs: Basic Metabolic Panel: Recent Labs  Lab 06/18/20 1600 06/19/20 0503  NA 136 136  K 3.9 4.0  CL 97* 98  CO2 24 26  GLUCOSE 194* 124*  BUN 70* 78*  CREATININE 8.12* 9.06*  CALCIUM 8.8* 8.4*  PHOS  --  4.9*   Liver Function Tests: Recent Labs  Lab 06/19/20 0503  AST 14*  ALT 16  ALKPHOS 97  BILITOT 0.7  PROT 5.4*  ALBUMIN 2.8*   CBC: Recent Labs  Lab 06/18/20 1600 06/19/20 0503  WBC 11.2* 8.0  NEUTROABS 8.4*  --   HGB 11.7* 10.1*  HCT 35.1* 30.0*  MCV 96.2 92.6  PLT 171 149*   Blood Culture    Component Value Date/Time   SDES BLOOD LEFT ANTECUBITAL 06/18/2020 1822   SPECREQUEST  06/18/2020 1822    BOTTLES DRAWN AEROBIC AND ANAEROBIC Blood Culture results may not be optimal due to an inadequate volume of blood received in culture bottles   CULT  06/18/2020 1822    NO GROWTH < 12 HOURS Performed at [facility], [place] [address] [place], [place] [address]    REPTSTATUS PENDING 06/18/2020 1822    CBG: Recent Labs  Lab 06/19/20 0738 06/19/20 1226 06/19/20 1555  06/19/20 2348 06/20/20 0812  GLUCAP 186* 214* 119* 156* 163*    Studies/Results: PERIPHERAL VASCULAR CATHETERIZATION  Result Date: 06/19/2020 Patient name: Duane Hall MRN: [id number] DOB: [birth year] Sex: male 06/19/2020 Pre-operative Diagnosis: Critical left lower extremity ischemia with heel ulceration, end-stage renal disease Post-operative diagnosis:  Same Surgeon:  Eda Paschal. Donzetta Matters, MD Procedure Performed: 1.  Ultrasound-guided cannulation right common femoral artery 2.  Aortogram with bilateral lower extremity runoff 3.  Laser atherectomy 1.5 Auryon left SFA 4.  Drug-coated balloon angioplasty with 5 mm Ranger left SFA 5.  Mynx device closure right common femoral artery 6.  Moderate sedation with fentanyl and Versed for 63 minutes Indications: 64 year old male on dialysis now has acute left heel ulceration.  He has decreased ABIs with monophasic waveforms bilaterally is indicated for angiography possible invention. Findings: The aorta and iliac segments are free of flow-limiting stenosis.  On the right side he has multiple approximate 50% stenoses.  Dominant runoff on the right is inline via the posterior tibial artery.  On the left side which is the site of interest he has approximately 90% stenosis after the takeoff of the SFA.  He did has multiple 60 to 80% stenoses in the distal SFA.  After atherectomy and  drug-coated balloon angioplasty he has 1 area of dissection proximally in the SFA this is nonflow limiting.  There is no residual stenosis.  Dominant runoff is via the posterior tibial artery which does fill the foot adequately.  Procedure:  The patient was identified in the holding area and taken to room 8.  The patient was then placed supine on the table and prepped and draped in the usual sterile fashion.  A time out was called.  Ultrasound was used to evaluate the right common femoral artery was noted to be patent and compressible.  There is anesthetized 1% lidocaine cannulated micropuncture  needle followed wire and sheath.  Images saved the permanent record.  Bentson wires placed followed by [place] sheath.  Omni catheter was placed the level of L1 aortogram was performed with above findings followed by bilateral lower extremity runoff.  We then crossed the bifurcation placed a long 6 French sheath patient was fully heparinized.  We crossed the lesion Glidewire cross catheter confirmed intraluminal access.  We placed a [place] core wire we then performed laser arthrectomy of the entire SFA.  This was then predilated with a 5 mm balloon at nominal pressure.  We then postdilated for 3 minutes and 2 areas with a long drug-coated balloon at nominal pressure.  Completion demonstrated 1 area of dissection no residual stenosis.  Dissection was tacked for 3 minutes at 3 atm proximally with a 5 x 40 mm balloon.  Unfortunately at completion there was still a small residual dissection no flow limitation.  Satisfied we exchanged for short 6 French sheath put a minx device.  He tolerated procedure without any complication. Contrast: 155cc [place] C. Donzetta Matters, MD [facility] Office: [phone] Pager: [phone]  DG Foot Complete Left  Result Date: 06/18/2020 CLINICAL DATA:  64 year old male with left foot pain. EXAM: LEFT FOOT - COMPLETE 3+ VIEW COMPARISON:  None. FINDINGS: There is no acute fracture or dislocation. The bones are osteopenic. No bone erosion or periosteal elevation to suggest acute osteomyelitis. There is diffuse soft tissue swelling of the dorsum of the foot primarily involving the forefoot. No radiopaque foreign object or soft tissue gas. Vascular calcification. IMPRESSION: No acute fracture or dislocation. Electronically Signed   By: Anner Crete M.D.   On: 06/18/2020 18:52   VAS [place] ABI WITH/WO TBI  Result Date: 06/19/2020 LOWER EXTREMITY DOPPLER STUDY Indications: Claudication, and ulceration. High Risk Factors: Hypertension, Diabetes.  Comparison Study:  No prior studies. Performing Technologist: Carlos Levering RVT  Examination Guidelines: A complete evaluation includes at minimum, Doppler waveform signals and systolic blood pressure reading at the level of bilateral brachial, anterior tibial, and posterior tibial arteries, when vessel segments are accessible. Bilateral testing is considered an integral part of a complete examination. Photoelectric Plethysmograph (PPG) waveforms and toe systolic pressure readings are included as required and additional duplex testing as needed. Limited examinations for reoccurring indications may be performed as noted.  ABI Findings: +---------+------------------+-----+----------+---------+ Right    Rt Pressure (mmHg)IndexWaveform  Comment   +---------+------------------+-----+----------+---------+ Brachial                                  HD Access +---------+------------------+-----+----------+---------+ PTA      113               0.85 monophasic          +---------+------------------+-----+----------+---------+ DP       108  0.81 monophasic          +---------+------------------+-----+----------+---------+ Great Toe86                0.65                     +---------+------------------+-----+----------+---------+ +--------+------------------+-----+----------+-------+ Left    Lt Pressure (mmHg)IndexWaveform  Comment +--------+------------------+-----+----------+-------+ [id number]                    triphasic         +--------+------------------+-----+----------+-------+ PTA     94                0.71 monophasic        +--------+------------------+-----+----------+-------+ DP      85                0.64 monophasic        +--------+------------------+-----+----------+-------+ +-------+-----------+-----------+------------+------------+ ABI/TBIToday's ABIToday's TBIPrevious ABIPrevious TBI +-------+-----------+-----------+------------+------------+ Right   0.85       0.65                                +-------+-----------+-----------+------------+------------+ Left   0.71                                           +-------+-----------+-----------+------------+------------+  Summary: Right: Resting right ankle-brachial index indicates mild right lower extremity arterial disease. Left: Resting left ankle-brachial index indicates moderate left lower extremity arterial disease. The left toe-brachial index is abnormal. Unable to obtain TBI due to low amplitude and inconsistent waveforms.  *See table(s) above for measurements and observations.  Electronically signed by Ruta Hinds MD on 06/19/2020 at 3:37:06 PM.   Final    Medications: . sodium chloride     . aspirin EC  81 mg Oral Daily  . Chlorhexidine Gluconate Cloth  6 each Topical Q0600  . clopidogrel  75 mg Oral Q breakfast  . ferric citrate  420 mg Oral TID WC  . gabapentin  300 mg Oral Daily  . heparin  5,000 Units Subcutaneous Q8H  . insulin aspart  0-6 Units Subcutaneous Q4H  . lidocaine-prilocaine  1 application Topical Once per day on Sun Mon Wed Fri  . multivitamin  1 tablet Oral Daily  . mupirocin ointment  1 application Topical Once per day on Sun Mon Wed Fri  . pantoprazole  40 mg Oral Daily  . rosuvastatin  10 mg Oral Daily  . sodium chloride flush  3 mL Intravenous Q12H    Dialysis Orders: Center: [facility] (Dr. Joelyn Oms) NxStage 4x week. CAR 170, 2K, 45 lactate, BFR 4500, EDW 70kg, AVF 15g needles Heparin 3000 unit bolus Auryxia 2 tabs TID with meals  Recent labs 06/01/20: Hgb 12.1, Tsat 33%, K+ 4.0, Alb 3.9, Ca 8.8, PTH 246, Cr 5.96  Assessment/Plan: 1.  L foot ulceration: History of diabetes, seen by vascular surgery and s/p arterectomy and angioplasty of left SFA yesterday. Cleared for d/c by vascular with outpatient follow up.  2.  ESRD:  Home hemodialysis patient, NxStage 4 days per week. Last HD was Thursday prior to admission, underwent HD overnight. On MWF  schedule here, resume home hemo at discharge. Stable to d/c from nephrology perspective.  3.  Hypertension/volume: BP soft on HD but now at goal. Net UF ~1.5L yesterday and now close to EDW.  Suspect some LE edema is due to inflammatory process.  4.  Anemia: Hgb 11.7 > 10.1. Not on outpatient ESA. Recent tsat at goal.  5.  Metabolic bone disease: Calcium and phos at goal. Continue auryxia. 6.  Nutrition:  Alb decreased compared to a few weeks ago. Start protein supplements. 7. T2DM: per primary  Anice Paganini, PA-C 06/20/2020, 8:29 AM  [facility] Pager: [phone]  Patient seen and examined, agree with above note with above modifications. Underwent angiogram with atherectomy and angioplasty and also had HD here yesterday.  Pt is feeling well and wants to go home-  He can resume nx stage home hemo upon discharge  Corliss Parish, MD 06/20/2020

## 2020-06-20 NOTE — Progress Notes (Signed)
Patient transferred to Dialysis via bed

## 2020-06-22 LAB — COLOGUARD: COLOGUARD: NEGATIVE

## 2020-06-23 ENCOUNTER — Other Ambulatory Visit: Payer: Self-pay

## 2020-06-23 ENCOUNTER — Ambulatory Visit: Payer: BC Managed Care – PPO | Admitting: Podiatry

## 2020-06-23 VITALS — BP 135/64 | HR 86 | Temp 98.6°F

## 2020-06-23 DIAGNOSIS — E1165 Type 2 diabetes mellitus with hyperglycemia: Secondary | ICD-10-CM | POA: Diagnosis not present

## 2020-06-23 DIAGNOSIS — N186 End stage renal disease: Secondary | ICD-10-CM

## 2020-06-23 DIAGNOSIS — I872 Venous insufficiency (chronic) (peripheral): Secondary | ICD-10-CM | POA: Diagnosis not present

## 2020-06-23 DIAGNOSIS — L89620 Pressure ulcer of left heel, unstageable: Secondary | ICD-10-CM | POA: Diagnosis not present

## 2020-06-23 DIAGNOSIS — F17200 Nicotine dependence, unspecified, uncomplicated: Secondary | ICD-10-CM

## 2020-06-23 DIAGNOSIS — L89616 Pressure-induced deep tissue damage of right heel: Secondary | ICD-10-CM | POA: Diagnosis not present

## 2020-06-23 DIAGNOSIS — I739 Peripheral vascular disease, unspecified: Secondary | ICD-10-CM

## 2020-06-23 DIAGNOSIS — Z09 Encounter for follow-up examination after completed treatment for conditions other than malignant neoplasm: Secondary | ICD-10-CM | POA: Diagnosis not present

## 2020-06-23 DIAGNOSIS — L97529 Non-pressure chronic ulcer of other part of left foot with unspecified severity: Secondary | ICD-10-CM | POA: Diagnosis not present

## 2020-06-23 DIAGNOSIS — E1169 Type 2 diabetes mellitus with other specified complication: Secondary | ICD-10-CM

## 2020-06-23 LAB — CULTURE, BLOOD (ROUTINE X 2)
Culture: NO GROWTH
Culture: NO GROWTH
Special Requests: ADEQUATE

## 2020-06-23 NOTE — Patient Instructions (Addendum)
Maintain a good balanced diet with lean protein   Complete the lab work  Keep the heels and feet offloded at all times with a pillow beneath the calf     Monitor for any signs/symptoms of infection such as redness swelling, foul odor or increased drainage. Call the office immediately if any occur or go directly to the emergency room. Call with any questions/concerns.    Pressure Injury  A pressure injury is damage to the skin and underlying tissue that results from pressure being applied to an area of the body. It often affects people who must spend a long time in a bed or chair because of a medical condition. Pressure injuries usually occur:  Over bony parts of the body, such as the tailbone, shoulders, elbows, hips, heels, spine, ankles, and back of the head.  Under medical devices that make contact with the body, such as respiratory equipment, stockings, tubes, and splints. Pressure injuries start as reddened areas on the skin and can lead to pain and an open wound. What are the causes? This condition is caused by frequent or constant pressure to an area of the body. Decreased blood flow to the skin can eventually cause the skin tissue to die and break down, causing a wound. What increases the risk? You are more likely to develop this condition if you:  Are in the hospital or an extended care facility.  Are bedridden or in a wheelchair.  Have an injury or disease that keeps you from: ? Moving normally. ? Feeling pain or pressure.  Have a condition that: ? Makes you sleepy or less alert. ? Causes poor blood flow.  Need to wear a medical device.  Have poor control of your bladder or bowel functions (incontinence).  Have poor nutrition (malnutrition). If you are at risk for pressure injuries, your health care provider may recommend certain types of mattresses, mattress covers, pillows, cushions, or boots to help prevent them. These may include products filled with air,  foam, gel, or sand. What are the signs or symptoms? Symptoms of this condition depend on the severity of the injury. Symptoms may include:  Red or dark areas of the skin.  Pain, warmth, or a change of skin texture.  Blisters.  An open wound. How is this diagnosed? This condition is diagnosed with a medical history and physical exam. You may also have tests, such as:  Blood tests.  Imaging tests.  Blood flow tests. Your pressure injury will be staged based on its severity. Staging is based on:  The depth of the tissue injury, including whether there is exposure of muscle, bone, or tendon.  The cause of the pressure injury. How is this treated? This condition may be treated by:  Relieving or redistributing pressure on your skin. This includes: ? Frequently changing your position. ? Avoiding positions that caused the wound or that can make the wound worse. ? Using specific bed mattresses, chair cushions, or protective boots. ? Moving medical devices from an area of pressure, or placing padding between the skin and the device. ? Using foams, creams, or powders to prevent rubbing (friction) on the skin.  Keeping your skin clean and dry. This may include using a skin cleanser or skin barrier as told by your health care provider.  Cleaning your injury and removing any dead tissue from the wound (debridement).  Placing a bandage (dressing) over your injury.  Using medicines for pain or to prevent or treat infection. Surgery may be needed  if other treatments are not working or if your injury is very deep. Follow these instructions at home: Wound care  Follow instructions from your health care provider about how to take care of your wound. Make sure you: ? Wash your hands with soap and water before and after you change your bandage (dressing). If soap and water are not available, use hand sanitizer. ? Change your dressing as told by your health care provider.  Check your wound  every day for signs of infection. Have a caregiver do this for you if you are not able. Check for: ? Redness, swelling, or increased pain. ? More fluid or blood. ? Warmth. ? Pus or a bad smell. Skin care  Keep your skin clean and dry. Gently pat your skin dry.  Do not rub or massage your skin.  You or a caregiver should check your skin every day for any changes in color or any new blisters or sores (ulcers). Medicines  Take over-the-counter and prescription medicines only as told by your health care provider.  If you were prescribed an antibiotic medicine, take or apply it as told by your health care provider. Do not stop using the antibiotic even if your condition improves. Reducing and redistributing pressure  Do not lie or sit in one position for a long time. Move or change position every 1-2 hours, or as told by your health care provider.  Use pillows or cushions to reduce pressure. Ask your health care provider to recommend cushions or pads for you. General instructions   Eat a healthy diet that includes lots of protein.  Drink enough fluid to keep your urine pale yellow.  Be as active as you can every day. Ask your health care provider to suggest safe exercises or activities.  Do not abuse drugs or alcohol.  Do not use any products that contain nicotine or tobacco, such as cigarettes, e-cigarettes, and chewing tobacco. If you need help quitting, ask your health care provider.  Keep all follow-up visits as told by your health care provider. This is important. Contact a health care provider if:  You have: ? A fever or chills. ? Pain that is not helped by medicine. ? Any changes in skin color. ? New blisters or sores. ? Pus or a bad smell coming from your wound. ? Redness, swelling, or pain around your wound. ? More fluid or blood coming from your wound.  Your wound does not improve after 1-2 weeks of treatment. Summary  A pressure injury is damage to the skin and  underlying tissue that results from pressure being applied to an area of the body.  Do not lie or sit in one position for a long time. Your health care provider may advise you to move or change position every 1-2 hours.  Follow instructions from your health care provider about how to take care of your wound.  Keep all follow-up visits as told by your health care provider. This is important. This information is not intended to replace advice given to you by your health care provider. Make sure you discuss any questions you have with your health care provider. Document Revised: 07/15/2018 Document Reviewed: 07/15/2018 Elsevier Patient Education  [employer].

## 2020-06-24 ENCOUNTER — Encounter: Payer: Self-pay | Admitting: Podiatry

## 2020-06-24 NOTE — Progress Notes (Signed)
Subjective:  Patient ID: Duane Hall, male    DOB: [birth year],  MRN: [id number]  Chief Complaint  Patient presents with  . Wound Check    Pt states bilateral heel blisters/wounds which began 2 days ago, left worse than right, blood/pus drainage. Pt denies fever/chills/nausea/vomiting. Left foot has redness which is spreading upwards. Right foot redness proximal to blister.    64 y.o. male presents for wound care.  He is here today with his wife to evaluate his bilateral heel wounds.  He was recently admitted to [facility] for the same.  He has a history of type 2 diabetes mellitus (desiccative this was severely uncontrolled once and his A1c was as high as 15, is now as low as 6.7 most recently), end-stage renal disease on hemodialysis at home, peripheral arterial disease, and he is a heavy smoker at 15 cigarettes/day.  This is his first ulceration.  He also has a history of neuropathy, for which he takes gabapentin.  He states that he has poor sensation in his feet.  The states this began a little over week ago (on 6/16) and rapidly worsened through the weekend, they started as blisters which worsened and became very red.  They also noticed drainage in his socks.Marland Kitchen  He is wife states that he is largely in bed at home most of the time, he was not walking much because he was having severe pains in his thigh and calf.  By Sunday 6/20, the wounds have worsened, and he had severe pain and redness around the ankle on the left side with a bloody blister on the right side.  They went to the emergency room, where he was given IV antibiotics and was admitted to the hospital.  Vascular surgery was consulted and Dr. Donzetta Matters took him for an angiogram, cross the occlusion and his left thigh, and placed a stent.  They state that his right leg had about 50% blockage, and the left leg had up to 90% blockage in the thigh.  The pain in his thigh and calf was almost completely gone after the vascular intervention.   The redness is also been improving steadily since then.  After discharge there were not present on antibiotics, and did not have specific wound care instructions or home nursing.  Review of Systems: Negative except as noted in the HPI. Denies N/V/F/Ch.  Past Medical History:  Diagnosis Date  . Asthma   . Cancer ([place])   . Cataract   . CKD (chronic kidney disease), stage III   . Diabetes mellitus   . Glaucoma   . Vascular insufficiency 05/2020    Current Outpatient Medications:  .  aspirin EC 81 MG EC tablet, Take 1 tablet (81 mg total) by mouth daily. Swallow whole., Disp: 30 tablet, Rfl: 11 .  clopidogrel (PLAVIX) 75 MG tablet, Take 1 tablet (75 mg total) by mouth daily with breakfast., Disp: 30 tablet, Rfl: 3 .  ferric citrate (AURYXIA) 1 GM 210 MG(Fe) tablet, Take 420 mg by mouth 3 (three) times daily with meals., Disp: , Rfl:  .  gabapentin (NEURONTIN) 100 MG capsule, Take 300 mg by mouth daily., Disp: , Rfl:  .  glipiZIDE (GLUCOTROL) 10 MG tablet, Take 10 mg by mouth 2 (two) times daily., Disp: , Rfl:  .  lidocaine-prilocaine (EMLA) cream, Apply 1 application topically See admin instructions. Apply topically to port access one hour prior to dialysis on Sunday, Monday, Wednesday, Thursday, Disp: , Rfl:  .  multivitamin (RENA-VIT) TABS  tablet, Take 1 tablet by mouth daily., Disp: , Rfl:  .  mupirocin ointment (BACTROBAN) 2 %, Apply 1 application topically See admin instructions. Apply topically to port access after dialysis - Sunday, Monday, Wednesday, Thursday, Disp: , Rfl:  .  omeprazole (PRILOSEC) 40 MG capsule, Take 40 mg by mouth daily., Disp: , Rfl:  .  rosuvastatin (CRESTOR) 10 MG tablet, Take 1 tablet (10 mg total) by mouth daily., Disp: 30 tablet, Rfl: 0  Social History   Tobacco Use  Smoking Status Current Every Day Smoker  . Packs/day: 0.80  . Types: Cigarettes  Smokeless Tobacco Never Used    No Known Allergies Objective:   Vitals:   06/23/20 1516  BP: 135/64    Pulse: 86  Temp: 98.6 F (37 C)   There is no height or weight on file to calculate BMI. Constitutional Well developed. Well nourished.  Vascular Dorsalis pedis pulses non palpable bilaterally. Posterior tibial pulses non palpable bilaterally. Moderate brawny edema bilaterally with left worse than right Capillary refill sluggish to all digits.  No cyanosis or clubbing noted. Pedal hair growth scant. Thin shiny skin is present  Neurologic Normal speech. Oriented to person, place, and time. Protective sensation absent  Dermatologic Wound Location: L heel, R heel Wound Base: Necrotic eschar on the left,  Drained hemorrhagic blister with roof intact on the right Peri-wound: Reddened, this extends to the medial and anterior ankle and almost petechial rash appearance, his wife has a picture from hospital admission in which this is much worse Exudate: None: wound tissue dry Wound Measurements: -On the left 5 cm x 5 cm x 0.1 cm -On the right 3 cm x 4 cm x 0.1 cm  Orthopedic:  Mild tenderness to wound beds   Data review:  On admission to the hospital, mild leukocytosis with a mild left shift (11.2 WBC, 8.4 absolute neutrophil count), blood cultures were negative;   operative note from Dr. Donzetta Matters: "On the right side multiple 50% stenoses with dominant runoff to the foot via PTA, on the left side 90% stenosis in the SFA proximally and multiple 60 to 80% stenoses in the distal SFA, runoff post intervention via PTA which does fill the foot adequately"  Radiographs: Nonweightbearing radiographs from admission of the left foot: No erosions or subcutaneous gas was present Assessment:   1. Pressure injury of left heel, unstageable ([place])   2. Pressure injury of deep tissue of right heel   3. Type 2 diabetes mellitus with other specified complication, without long-term current use of insulin ([place])   4. PAD (peripheral artery disease) ([place])   5. Current smoker   6. ESRD (end stage renal disease)  ([place])    Plan:  Patient was evaluated and treated and all questions answered.  Mr. Stucki is a pleasant 64 year old gentleman with a complex medical history with multiple comorbidities for wound development and poor wound healing.  This is his first encounter with an ulceration.  Currently, the wound beds appear to be stable without severe soft tissue infection.  I do think he had a mild soft tissue infection when he was admitted to the hospital given his laboratory work and the photos that his wife shows me.  I am most concerned about the left heel at this time with a necrotic eschar.  The right heel has what appears to be a draining hemorrhagic blister and I am hopeful that this will be able to heal on its own and has not worsened.  The necrotic  eschar on the left is well adhered, there is no sinus tract formation no exposed tendon bone or muscle.  Hopeful that this will heal now that he has had successful vascular intervention by Dr. Donzetta Matters with local wound care.  I discussed the etiology and nature of the wounds and that has is likely mixed etiology of pressure and severe arterial disease.    I counseled him on his other risk factors, his diabetes is now well controlled, his neuropathy remains a factor as does his predominantly nonambulatory state (he walks on his own, but had been largely staying in bed due to pain secondary to claudication) I counseled them on positioning, offloading and we dispensed offloading boots today.  His end-stage renal disease is also impediment to his wound healing, but this is not likely to change in the near future.  His smoking is likely right now the biggest impediment to his wound healing and I counseled him that he should stop smoking as much as possible, and ideally completely.  We will continue with local wound care at this time, with a hydrogel and dry gauze dressings on the left side, and Silvadene over the blister on the right side applied today.  I will order home  wound care supplies (his wife is comfortable changing the dressings for him), to consist of Santyl to the left heel, and foam border dressings bilaterally.  I also discussed his case with Dr. Donzetta Matters directly.  He remains at a very high risk of limb loss, infection, osteomyelitis, which would have significant implications on his morbidity and life expectancy.  Would like to see him back in the office in 1 week to reevaluate his wounds.  I have asked him to obtain some lab work including a CBC, erythrocyte segmentation rate, and a C-reactive protein.  Currently there is no clinical evidence of deep bone infection, we will continue to trend these as needed pending his progress to monitor this.  If he worsens will consider MRI or bone scan to evaluate possibility of calcaneal infection if this arises.  Lanae Crumbly, DPM 06/24/2020     Return in about 1 week (around 06/30/2020) for diabetic ulcer bilateral.

## 2020-06-26 ENCOUNTER — Telehealth: Payer: Self-pay | Admitting: *Deleted

## 2020-06-26 ENCOUNTER — Other Ambulatory Visit: Payer: Self-pay | Admitting: Podiatry

## 2020-06-26 ENCOUNTER — Ambulatory Visit: Payer: BC Managed Care – PPO | Admitting: Podiatry

## 2020-06-26 NOTE — Telephone Encounter (Signed)
Faxed orders for bordered foam dressing with silver for daily wound redressing to right and left heels to [facility].

## 2020-06-26 NOTE — Telephone Encounter (Signed)
-----   Message from [name], DPM sent at 06/24/2020  9:12 AM EDT ----- Regarding: Home Wound Care supplies Hi [name],  I've ordered home wound care supplies for Mr Duane Hall, I left the form for [facility] in your door box. Let me know if you need any more documentation from me! Thanks!

## 2020-06-27 DIAGNOSIS — L89620 Pressure ulcer of left heel, unstageable: Secondary | ICD-10-CM | POA: Diagnosis not present

## 2020-06-27 LAB — C-REACTIVE PROTEIN: CRP: 25 mg/L — ABNORMAL HIGH (ref 0–10)

## 2020-06-27 LAB — CBC WITH DIFFERENTIAL/PLATELET
Basophils Absolute: 0 10*3/uL (ref 0.0–0.2)
Basos: 1 %
EOS (ABSOLUTE): 0.2 10*3/uL (ref 0.0–0.4)
Eos: 3 %
Hematocrit: 27.6 % — ABNORMAL LOW (ref 37.5–51.0)
Hemoglobin: 9.5 g/dL — ABNORMAL LOW (ref 13.0–17.7)
Immature Grans (Abs): 0 10*3/uL (ref 0.0–0.1)
Immature Granulocytes: 1 %
Lymphocytes Absolute: 1 10*3/uL (ref 0.7–3.1)
Lymphs: 14 %
MCH: 31.9 pg (ref 26.6–33.0)
MCHC: 34.4 g/dL (ref 31.5–35.7)
MCV: 93 fL (ref 79–97)
Monocytes Absolute: 0.6 10*3/uL (ref 0.1–0.9)
Monocytes: 8 %
Neutrophils Absolute: 5.3 10*3/uL (ref 1.4–7.0)
Neutrophils: 73 %
Platelets: 203 10*3/uL (ref 150–450)
RBC: 2.98 x10E6/uL — ABNORMAL LOW (ref 4.14–5.80)
RDW: 11.9 % (ref 11.6–15.4)
WBC: 7.3 10*3/uL (ref 3.4–10.8)

## 2020-06-27 LAB — SEDIMENTATION RATE: Sed Rate: 52 mm/hr — ABNORMAL HIGH (ref 0–30)

## 2020-06-28 DIAGNOSIS — Z992 Dependence on renal dialysis: Secondary | ICD-10-CM | POA: Diagnosis not present

## 2020-06-28 DIAGNOSIS — E1129 Type 2 diabetes mellitus with other diabetic kidney complication: Secondary | ICD-10-CM | POA: Diagnosis not present

## 2020-06-28 DIAGNOSIS — N186 End stage renal disease: Secondary | ICD-10-CM | POA: Diagnosis not present

## 2020-06-29 ENCOUNTER — Other Ambulatory Visit: Payer: Self-pay

## 2020-06-29 ENCOUNTER — Ambulatory Visit: Payer: BC Managed Care – PPO | Admitting: Podiatry

## 2020-06-29 DIAGNOSIS — E1169 Type 2 diabetes mellitus with other specified complication: Secondary | ICD-10-CM

## 2020-06-29 DIAGNOSIS — L89620 Pressure ulcer of left heel, unstageable: Secondary | ICD-10-CM | POA: Diagnosis not present

## 2020-06-29 DIAGNOSIS — L89616 Pressure-induced deep tissue damage of right heel: Secondary | ICD-10-CM | POA: Diagnosis not present

## 2020-06-29 DIAGNOSIS — I739 Peripheral vascular disease, unspecified: Secondary | ICD-10-CM

## 2020-06-29 DIAGNOSIS — F17200 Nicotine dependence, unspecified, uncomplicated: Secondary | ICD-10-CM

## 2020-06-29 DIAGNOSIS — N186 End stage renal disease: Secondary | ICD-10-CM

## 2020-06-29 MED ORDER — SANTYL 250 UNIT/GM EX OINT
1.0000 | TOPICAL_OINTMENT | Freq: Every day | CUTANEOUS | 2 refills | Status: DC
Start: 2020-06-29 — End: 2020-12-25

## 2020-06-30 DIAGNOSIS — K3184 Gastroparesis: Secondary | ICD-10-CM | POA: Diagnosis not present

## 2020-06-30 NOTE — Progress Notes (Signed)
°  Subjective:  Patient ID: DEVERICK PRUSS, male    DOB: [birth year],  MRN: [id number]  Chief Complaint  Patient presents with   Foot Ulcer    bilateral, 1 week follow up    64 y.o. male presents with the above complaint. History confirmed with patient.  Here again today with his wife.  They just received the home care supplies last night.  They received a silver ointment at the pharmacy and have been applying this to both heels as well as dry dressings.  Overall they feel like they have improved somewhat, with decreased in redness, and pain.  He has been using the offloading boots and keeping a pillow under the calf to offload the heels.  Objective:  Physical Exam: Improved erythematous rash in the medial ankle and left foot.  Stable eschar present on the heel, with slightly fibrotic edges.  Well adhered, tender to touch.  Less hardened than it was last week.  On the right foot, there is a hemorrhagic blister that is similar in appearance to last week.  Mother also has a sinus tract formation, no purulence, no malodor, no cellulitis present.  Capillary fill time at the periwound is brisk.      Assessment:   1. Pressure injury of left heel, unstageable ([place])   2. Pressure injury of deep tissue of right heel   3. Type 2 diabetes mellitus with other specified complication, without long-term current use of insulin ([place])   4. PAD (peripheral artery disease) ([place])   5. Current smoker   6. ESRD (end stage renal disease) ([place])      Plan:  Patient was evaluated and treated and all questions answered.  -Today there is mild improvement in the appearance and characteristics of both wounds.  No signs of infection.  I discussed this case last week with Dr. Donzetta Matters directly, and he is hopeful that the wounds will heal following a successful vascular reconstruction, as am I.  He and his wife will continue local wound care with Santyl and dry sterile dressings and offloading at home.  There is currently  no indication for antibiotics or advanced imaging such as an MRI, we did discuss the signs symptoms of a worsening infection or deterioration of the wounds and if any these will develop they will report to the nearest emergency room and notify me.  No debridement necessary, as of the eschar is still well adhered and is healing.  Clean dry dressing was applied of Silvadene and dry sterile dressings.  We again discussed the overall prognosis and course of pressure ulcerations.  He will follow up with me in 2 weeks for reevaluation.  Lanae Crumbly, DPM 06/30/2020     Return in about 1 week (around 07/06/2020) for bilateral ulcer.

## 2020-07-02 DIAGNOSIS — E1143 Type 2 diabetes mellitus with diabetic autonomic (poly)neuropathy: Secondary | ICD-10-CM | POA: Insufficient documentation

## 2020-07-06 ENCOUNTER — Other Ambulatory Visit: Payer: Self-pay | Admitting: *Deleted

## 2020-07-06 DIAGNOSIS — Z992 Dependence on renal dialysis: Secondary | ICD-10-CM

## 2020-07-06 DIAGNOSIS — N186 End stage renal disease: Secondary | ICD-10-CM

## 2020-07-13 ENCOUNTER — Other Ambulatory Visit: Payer: Self-pay

## 2020-07-13 ENCOUNTER — Ambulatory Visit: Payer: BC Managed Care – PPO | Admitting: Podiatry

## 2020-07-13 DIAGNOSIS — L89620 Pressure ulcer of left heel, unstageable: Secondary | ICD-10-CM

## 2020-07-13 DIAGNOSIS — I739 Peripheral vascular disease, unspecified: Secondary | ICD-10-CM

## 2020-07-13 DIAGNOSIS — E1169 Type 2 diabetes mellitus with other specified complication: Secondary | ICD-10-CM

## 2020-07-13 DIAGNOSIS — F17200 Nicotine dependence, unspecified, uncomplicated: Secondary | ICD-10-CM

## 2020-07-13 DIAGNOSIS — L89616 Pressure-induced deep tissue damage of right heel: Secondary | ICD-10-CM | POA: Diagnosis not present

## 2020-07-13 DIAGNOSIS — N186 End stage renal disease: Secondary | ICD-10-CM

## 2020-07-13 NOTE — Progress Notes (Addendum)
Subjective:  Patient ID: Duane Hall, male    DOB: May 01, 1956,  MRN: [id number]  Chief Complaint  Patient presents with  . Foot Ulcer    bilateral heels, 2week follow up    64 y.o. male presents with the above complaint. History confirmed with patient.  Here again today with his wife.  They have had some difficulty changing the dressings.  She feels like the skin is constantly tugging at the adhesive.  She is concerned about the loose skin and drainage that is coming out from under it.  Objective:  Physical Exam: Left ankle edema and erythema has nearly completely resolved.  The right heel eschar has coalesced into a stable dry hardened blister without drainage.  The left side is somewhat harder the last week but has a loosened fibrotic edge with mild serous drainage with loose disorganized fibrous material.  No signs of infection.  No sinus tract formation.  No bone exposure.        Assessment:   1. Pressure injury of left heel, unstageable ([place])   2. Pressure injury of deep tissue of right heel   3. Type 2 diabetes mellitus with other specified complication, without long-term current use of insulin ([place])   4. PAD (peripheral artery disease) ([place])   5. Current smoker   6. ESRD (end stage renal disease) ([place])      Plan:  Patient was evaluated and treated and all questions answered.  -Again, today he he is having slow but steady progress improving in both ulcerations.  I discussed with him and his wife the local wound care and this has become quite difficult with him as far as monitoring the quality of the wound, and applying the dressings every day.  I would like for them to have a home care wound nurse come to visit to assist in this is a believe is medically necessary as he is essentially bedbound at this point with bilateral heel ulcerations and his ulcerations are at high risk of infection and limb loss. -Continue local wound care, with Santyl on the left heel and dry  sterile dressings, and dry sterile dressings in the right heel -Continue strict offloading precautions I reviewed this with him today.  They did ask about transferring from the bedroom to the living room, and I think this is reasonable for him to put some weight on the forefoot, however I would like him to have a wheelchair to be able to do this.  Again this is medically necessary due to his high risk of wound infection, limited mobility, and limb loss. -He is due to see Dr. Donzetta Matters tomorrow for follow-up  -I am ordering a home care nurse to assist in his dressing changes, this is medically necessary for bilateral heel ulcers at high risk of infection, additionally he is non weightbearing bilaterally and is unable to leave his home easily. His wife is at home with him but is having difficulty with the dressing changes and this could increase his risk of deterioration of his ulcers -I am also ordering a wheelchair for mobility. This is for his bilateral heel ulcerations that are at high risk of infection and require him to be non-weighbearing. It is medically necessary for him to have a wheelchair to maintain his non weightbearing status while performing his activities of daily living at home. His wife is at home with him and is able to help in transfers to and from the wheelchair  Lanae Crumbly, [facility] 07/18/2020  Return in about 3 weeks (around 08/03/2020) for wound re-check.

## 2020-07-13 NOTE — Patient Instructions (Signed)
Monitor for any signs/symptoms of infection. Signs of an infection could be redness beyond the site of the incision/procedure/wound, foul smelling odor, drainage that is thick and yellow or green, or severe swelling and pain. Call the office immediately if any occur or go directly to the emergency room. Call with any questions/concerns.    We will be in touch with you regarding the home care nurse and a wheelchair

## 2020-07-14 ENCOUNTER — Ambulatory Visit (INDEPENDENT_AMBULATORY_CARE_PROVIDER_SITE_OTHER)
Admission: RE | Admit: 2020-07-14 | Discharge: 2020-07-14 | Disposition: A | Discharge status: Home / Self Care | Payer: BC Managed Care – PPO | Source: Ambulatory Visit | Attending: Vascular Surgery | Admitting: Vascular Surgery

## 2020-07-14 ENCOUNTER — Ambulatory Visit (HOSPITAL_COMMUNITY)
Admission: RE | Admit: 2020-07-14 | Discharge: 2020-07-14 | Disposition: A | Discharge status: Home / Self Care | Payer: BC Managed Care – PPO | Source: Ambulatory Visit | Attending: Vascular Surgery | Admitting: Vascular Surgery

## 2020-07-14 ENCOUNTER — Ambulatory Visit (INDEPENDENT_AMBULATORY_CARE_PROVIDER_SITE_OTHER): Payer: BC Managed Care – PPO | Admitting: Vascular Surgery

## 2020-07-14 ENCOUNTER — Encounter: Payer: Self-pay | Admitting: Vascular Surgery

## 2020-07-14 VITALS — BP 123/66 | HR 83 | Temp 98.2°F | Resp 20 | Ht 69.0 in | Wt 155.0 lb

## 2020-07-14 DIAGNOSIS — Z992 Dependence on renal dialysis: Secondary | ICD-10-CM

## 2020-07-14 DIAGNOSIS — N186 End stage renal disease: Secondary | ICD-10-CM | POA: Insufficient documentation

## 2020-07-14 DIAGNOSIS — I739 Peripheral vascular disease, unspecified: Secondary | ICD-10-CM | POA: Diagnosis not present

## 2020-07-14 NOTE — Progress Notes (Signed)
Patient ID: Duane Hall, male   DOB: 11/16/1956, 64 y.o.   MRN: [id number]  Reason for Consult: No chief complaint on file.   Referred by Heywood Bene, *  Subjective:     HPI:  Duane Hall is a 64 y.o. male follows up for bilateral lower extremity heel ulceration after left lower extremity angiography with intervention.  He does have end-stage renal disease dialyzes via right arm fistula.  He is taking aspirin Plavix and statin.  He does not have any fevers at home.  Past Medical History:  Diagnosis Date  . Asthma   . Cancer ([place])   . Cataract   . CKD (chronic kidney disease), stage III   . Diabetes mellitus   . Glaucoma   . Vascular insufficiency 05/2020   Family History  Problem Relation Age of Onset  . Cancer Father    Past Surgical History:  Procedure Laterality Date  . ABDOMINAL AORTOGRAM W/LOWER EXTREMITY N/A 06/19/2020   Procedure: ABDOMINAL AORTOGRAM W/LOWER EXTREMITY;  Surgeon: Waynetta Sandy, MD;  Location: [facility];  Service: Cardiovascular;  Laterality: N/A;  . AV FISTULA PLACEMENT Right 09/14/2019   Procedure: Right Arm Basilic Vein transposition;  Surgeon: Angelia Mould, MD;  Location: [facility];  Service: Vascular;  Laterality: Right;  . COLONOSCOPY    . PERIPHERAL VASCULAR ATHERECTOMY Left 06/19/2020   Procedure: PERIPHERAL VASCULAR ATHERECTOMY;  Surgeon: Waynetta Sandy, MD;  Location: [facility];  Service: Cardiovascular;  Laterality: Left;  SFA  . UPPER GASTROINTESTINAL ENDOSCOPY  02/2019   Dr Ermelinda Das Social History:  Social History   Tobacco Use  . Smoking status: Current Every Day Smoker    Packs/day: 0.80    Types: Cigarettes  . Smokeless tobacco: Never Used  Substance Use Topics  . Alcohol use: No    Alcohol/week: 0.0 standard drinks    No Known Allergies  Current Outpatient Medications  Medication Sig Dispense Refill  . aspirin EC 81 MG EC tablet Take 1 tablet (81 mg  total) by mouth daily. Swallow whole. 30 tablet 11  . clopidogrel (PLAVIX) 75 MG tablet Take 1 tablet (75 mg total) by mouth daily with breakfast. 30 tablet 3  . collagenase (SANTYL) ointment Apply 1 application topically daily. Apply to the left heel daily with a layer the thickness of a nickel 30 g 2  . ferric citrate (AURYXIA) 1 GM 210 MG(Fe) tablet Take 420 mg by mouth 3 (three) times daily with meals.    . gabapentin (NEURONTIN) 100 MG capsule Take 300 mg by mouth daily.    Marland Kitchen glipiZIDE (GLUCOTROL) 10 MG tablet Take 10 mg by mouth 2 (two) times daily.    Marland Kitchen lidocaine-prilocaine (EMLA) cream Apply 1 application topically See admin instructions. Apply topically to port access one hour prior to dialysis on Sunday, Monday, Wednesday, Thursday    . Methoxy PEG-Epoetin Beta (MIRCERA IJ) Inject into the skin.    . multivitamin (RENA-VIT) TABS tablet Take 1 tablet by mouth daily.    . mupirocin ointment (BACTROBAN) 2 % Apply 1 application topically See admin instructions. Apply topically to port access after dialysis - Sunday, Monday, Wednesday, Thursday    . omeprazole (PRILOSEC) 40 MG capsule Take 40 mg by mouth daily.    . rosuvastatin (CRESTOR) 10 MG tablet Take 1 tablet (10 mg total) by mouth daily. 30 tablet 0   No current facility-administered medications for this visit.  Review of Systems  Constitutional:  Constitutional negative. HENT: HENT negative.  Cardiovascular: Positive for leg swelling.  GI: Gastrointestinal negative.  Musculoskeletal: Positive for leg pain.  Skin: Positive for wound.  Neurological: Neurological negative. Hematologic: Hematologic/lymphatic negative.  Psychiatric: Psychiatric negative.        Objective:  Objective   Vitals:   07/14/20 0922  BP: 123/66  Pulse: 83  Resp: 20  Temp: 98.2 F (36.8 C)  SpO2: 97%     Physical Exam Constitutional:      Appearance: Normal appearance.  HENT:     Head: Normocephalic.     Nose: Nose normal.  Eyes:      Pupils: Pupils are equal, round, and reactive to light.  Cardiovascular:     Rate and Rhythm: Normal rate.     Pulses:          Radial pulses are 2+ on the right side and 2+ on the left side.       Femoral pulses are 2+ on the right side and 2+ on the left side.      Popliteal pulses are 2+ on the right side and 2+ on the left side.       Posterior tibial pulses are 2+ on the right side and 1+ on the left side.  Abdominal:     General: Abdomen is flat.     Palpations: Abdomen is soft.  Musculoskeletal:        General: Normal range of motion.  Neurological:     General: No focal deficit present.     Mental Status: He is alert.  Psychiatric:        Mood and Affect: Mood normal.        Thought Content: Thought content normal.        Judgment: Judgment normal.     Data: I have independently interpreted his ABIs to be right 0.9 left 0.97 with toe pressure on the right 171 and left 66.  Left SFA proximally has 75 to 99% stenosis with monophasic waveforms distally.     Assessment/Plan:     64 year old male status post left lower extremity SFA intervention.  Did have a some apparent tibial disease at the time also his right lower extremity severe disease.  Has left greater than right heel ulceration pictures were documented by Dr. Sherryle Lis with podiatry yesterday.  He is undergoing treatment with Dr. Sherryle Lis and will continue with this.  I discussed that he does have a significant likelihood of facing below-knee amputation in the future.  He has a strongly palpable posterior tibial pulse on the right today and a weakly palpable left posterior tibial pulse.  Because of this I would not pursue any further intervention of his bilateral lower extremities at this time.  We will follow him up in 3 months with repeat studies.  I discussed that if this wound particularly on the left worsens he would need below-knee amputation and they are of good understanding.     Waynetta Sandy  MD [facility]

## 2020-07-18 ENCOUNTER — Telehealth: Payer: Self-pay | Admitting: *Deleted

## 2020-07-18 DIAGNOSIS — I739 Peripheral vascular disease, unspecified: Secondary | ICD-10-CM

## 2020-07-18 DIAGNOSIS — L89616 Pressure-induced deep tissue damage of right heel: Secondary | ICD-10-CM

## 2020-07-18 DIAGNOSIS — L89620 Pressure ulcer of left heel, unstageable: Secondary | ICD-10-CM

## 2020-07-18 DIAGNOSIS — E1169 Type 2 diabetes mellitus with other specified complication: Secondary | ICD-10-CM

## 2020-07-18 NOTE — Telephone Encounter (Signed)
-----   Message from [name], DPM sent at 07/18/2020  8:24 AM EDT ----- I've addended the note, let me know if you need anything else. Thanks! ----- Message ----- From: Andres Ege, RN Sent: 07/17/2020  10:10 AM EDT To: [name], DPM  Dr. Sherryle Lis, Your clinical note will need to state: duration of need                                                                                  What needed for (heel ulcers)                                                                                   Medical Necessity (to offload for healing)                                                                                  Pt or caregiver able to assist. I will put in the order, once you let me know these things are in the note. (Then it gets tricky!) Marcy Siren ----- Message ----- From: [name], DPM Sent: 07/14/2020   8:50 AM EDT To: Andres Ege, RN  Hi [name],  Would you be able to put referrals in for [facility] for a wheelchair and home nursing for wound care? He has bilateral heel ulcers and is essentially bed bound currently. I haven't ordered a wheelchair yet so if you need any info/documentation/signatures from me please let me know!  Thanks! Lanae Crumbly

## 2020-07-18 NOTE — Telephone Encounter (Signed)
Faxed required form, orders for light weight wheelchair with brake extenders, rear anti-tippers, and seat cushion for offloading of B/L heel ulcers to [employer] and emailed.

## 2020-07-18 NOTE — Telephone Encounter (Signed)
Done. Thanks.

## 2020-07-19 ENCOUNTER — Other Ambulatory Visit: Payer: Self-pay | Admitting: *Deleted

## 2020-07-19 ENCOUNTER — Other Ambulatory Visit: Payer: Self-pay | Admitting: Vascular Surgery

## 2020-07-19 DIAGNOSIS — I739 Peripheral vascular disease, unspecified: Secondary | ICD-10-CM

## 2020-07-20 DIAGNOSIS — L89623 Pressure ulcer of left heel, stage 3: Secondary | ICD-10-CM | POA: Diagnosis not present

## 2020-07-20 DIAGNOSIS — I7300 Raynaud's syndrome without gangrene: Secondary | ICD-10-CM | POA: Diagnosis not present

## 2020-07-20 DIAGNOSIS — L8996 Pressure-induced deep tissue damage of unspecified site: Secondary | ICD-10-CM | POA: Diagnosis not present

## 2020-07-24 ENCOUNTER — Telehealth: Payer: Self-pay | Admitting: *Deleted

## 2020-07-24 DIAGNOSIS — E113553 Type 2 diabetes mellitus with stable proliferative diabetic retinopathy, bilateral: Secondary | ICD-10-CM | POA: Diagnosis not present

## 2020-07-24 DIAGNOSIS — H5203 Hypermetropia, bilateral: Secondary | ICD-10-CM | POA: Diagnosis not present

## 2020-07-24 DIAGNOSIS — H52203 Unspecified astigmatism, bilateral: Secondary | ICD-10-CM | POA: Diagnosis not present

## 2020-07-24 DIAGNOSIS — Z961 Presence of intraocular lens: Secondary | ICD-10-CM | POA: Diagnosis not present

## 2020-07-24 DIAGNOSIS — H524 Presbyopia: Secondary | ICD-10-CM | POA: Diagnosis not present

## 2020-07-24 NOTE — Telephone Encounter (Signed)
Pt's wife called for status of [facility].

## 2020-07-24 NOTE — Telephone Encounter (Signed)
I informed pt's wife, [facility] was currently at a premium and pt's insurance and [facility] staffing availability was difficult to get, but I was sending to [facility] and hopefully they would be able to accept pt.

## 2020-07-25 ENCOUNTER — Other Ambulatory Visit: Payer: Self-pay

## 2020-07-25 MED ORDER — ROSUVASTATIN CALCIUM 10 MG PO TABS: 10.0000 mg | ORAL_TABLET | Freq: Every day | ORAL | 0 refills | Status: DC

## 2020-07-26 ENCOUNTER — Encounter (HOSPITAL_COMMUNITY): Payer: Self-pay

## 2020-07-26 ENCOUNTER — Emergency Department (HOSPITAL_COMMUNITY): Payer: BC Managed Care – PPO

## 2020-07-26 ENCOUNTER — Inpatient Hospital Stay (HOSPITAL_COMMUNITY)
Admission: EM | Admit: 2020-07-26 | Discharge: 2020-08-01 | DRG: 853 | Disposition: A | Discharge status: Home Health | Payer: BC Managed Care – PPO | Attending: Internal Medicine | Admitting: Internal Medicine

## 2020-07-26 ENCOUNTER — Other Ambulatory Visit: Payer: Self-pay

## 2020-07-26 DIAGNOSIS — L97529 Non-pressure chronic ulcer of other part of left foot with unspecified severity: Secondary | ICD-10-CM | POA: Diagnosis present

## 2020-07-26 DIAGNOSIS — R531 Weakness: Secondary | ICD-10-CM | POA: Diagnosis not present

## 2020-07-26 DIAGNOSIS — I340 Nonrheumatic mitral (valve) insufficiency: Secondary | ICD-10-CM | POA: Diagnosis not present

## 2020-07-26 DIAGNOSIS — B1920 Unspecified viral hepatitis C without hepatic coma: Secondary | ICD-10-CM | POA: Diagnosis not present

## 2020-07-26 DIAGNOSIS — R600 Localized edema: Secondary | ICD-10-CM | POA: Diagnosis not present

## 2020-07-26 DIAGNOSIS — L97419 Non-pressure chronic ulcer of right heel and midfoot with unspecified severity: Secondary | ICD-10-CM | POA: Diagnosis not present

## 2020-07-26 DIAGNOSIS — Z7902 Long term (current) use of antithrombotics/antiplatelets: Secondary | ICD-10-CM

## 2020-07-26 DIAGNOSIS — Z7984 Long term (current) use of oral hypoglycemic drugs: Secondary | ICD-10-CM

## 2020-07-26 DIAGNOSIS — I10 Essential (primary) hypertension: Secondary | ICD-10-CM | POA: Diagnosis not present

## 2020-07-26 DIAGNOSIS — E1165 Type 2 diabetes mellitus with hyperglycemia: Secondary | ICD-10-CM | POA: Diagnosis not present

## 2020-07-26 DIAGNOSIS — M549 Dorsalgia, unspecified: Secondary | ICD-10-CM | POA: Diagnosis not present

## 2020-07-26 DIAGNOSIS — L97429 Non-pressure chronic ulcer of left heel and midfoot with unspecified severity: Secondary | ICD-10-CM | POA: Diagnosis not present

## 2020-07-26 DIAGNOSIS — E785 Hyperlipidemia, unspecified: Secondary | ICD-10-CM | POA: Diagnosis present

## 2020-07-26 DIAGNOSIS — F17210 Nicotine dependence, cigarettes, uncomplicated: Secondary | ICD-10-CM | POA: Diagnosis present

## 2020-07-26 DIAGNOSIS — E1142 Type 2 diabetes mellitus with diabetic polyneuropathy: Secondary | ICD-10-CM | POA: Diagnosis not present

## 2020-07-26 DIAGNOSIS — N186 End stage renal disease: Secondary | ICD-10-CM | POA: Diagnosis not present

## 2020-07-26 DIAGNOSIS — N2581 Secondary hyperparathyroidism of renal origin: Secondary | ICD-10-CM | POA: Diagnosis not present

## 2020-07-26 DIAGNOSIS — D631 Anemia in chronic kidney disease: Secondary | ICD-10-CM | POA: Diagnosis present

## 2020-07-26 DIAGNOSIS — I959 Hypotension, unspecified: Secondary | ICD-10-CM | POA: Diagnosis not present

## 2020-07-26 DIAGNOSIS — R410 Disorientation, unspecified: Secondary | ICD-10-CM | POA: Diagnosis not present

## 2020-07-26 DIAGNOSIS — Z992 Dependence on renal dialysis: Secondary | ICD-10-CM | POA: Diagnosis not present

## 2020-07-26 DIAGNOSIS — E876 Hypokalemia: Secondary | ICD-10-CM | POA: Diagnosis present

## 2020-07-26 DIAGNOSIS — I9589 Other hypotension: Secondary | ICD-10-CM | POA: Diagnosis present

## 2020-07-26 DIAGNOSIS — J449 Chronic obstructive pulmonary disease, unspecified: Secondary | ICD-10-CM | POA: Diagnosis present

## 2020-07-26 DIAGNOSIS — Z20822 Contact with and (suspected) exposure to covid-19: Secondary | ICD-10-CM | POA: Diagnosis not present

## 2020-07-26 DIAGNOSIS — I998 Other disorder of circulatory system: Secondary | ICD-10-CM | POA: Diagnosis present

## 2020-07-26 DIAGNOSIS — M86172 Other acute osteomyelitis, left ankle and foot: Secondary | ICD-10-CM

## 2020-07-26 DIAGNOSIS — A419 Sepsis, unspecified organism: Secondary | ICD-10-CM | POA: Diagnosis present

## 2020-07-26 DIAGNOSIS — Z7982 Long term (current) use of aspirin: Secondary | ICD-10-CM

## 2020-07-26 DIAGNOSIS — R61 Generalized hyperhidrosis: Secondary | ICD-10-CM | POA: Diagnosis not present

## 2020-07-26 DIAGNOSIS — E1169 Type 2 diabetes mellitus with other specified complication: Secondary | ICD-10-CM | POA: Diagnosis not present

## 2020-07-26 DIAGNOSIS — E11621 Type 2 diabetes mellitus with foot ulcer: Secondary | ICD-10-CM | POA: Diagnosis present

## 2020-07-26 DIAGNOSIS — E1129 Type 2 diabetes mellitus with other diabetic kidney complication: Secondary | ICD-10-CM | POA: Diagnosis not present

## 2020-07-26 DIAGNOSIS — E119 Type 2 diabetes mellitus without complications: Secondary | ICD-10-CM

## 2020-07-26 DIAGNOSIS — L89629 Pressure ulcer of left heel, unspecified stage: Secondary | ICD-10-CM | POA: Diagnosis not present

## 2020-07-26 DIAGNOSIS — B182 Chronic viral hepatitis C: Secondary | ICD-10-CM | POA: Diagnosis not present

## 2020-07-26 DIAGNOSIS — E1122 Type 2 diabetes mellitus with diabetic chronic kidney disease: Secondary | ICD-10-CM | POA: Diagnosis not present

## 2020-07-26 DIAGNOSIS — H409 Unspecified glaucoma: Secondary | ICD-10-CM | POA: Diagnosis present

## 2020-07-26 DIAGNOSIS — M869 Osteomyelitis, unspecified: Secondary | ICD-10-CM | POA: Diagnosis not present

## 2020-07-26 IMAGING — DX DG CHEST 1V PORT
1 series · 1 of 1 positions shown · non-contrast
Comparison: [DATE]

CLINICAL DATA: Fever

EXAM:
PORTABLE CHEST 1 VIEW

[chest ap]
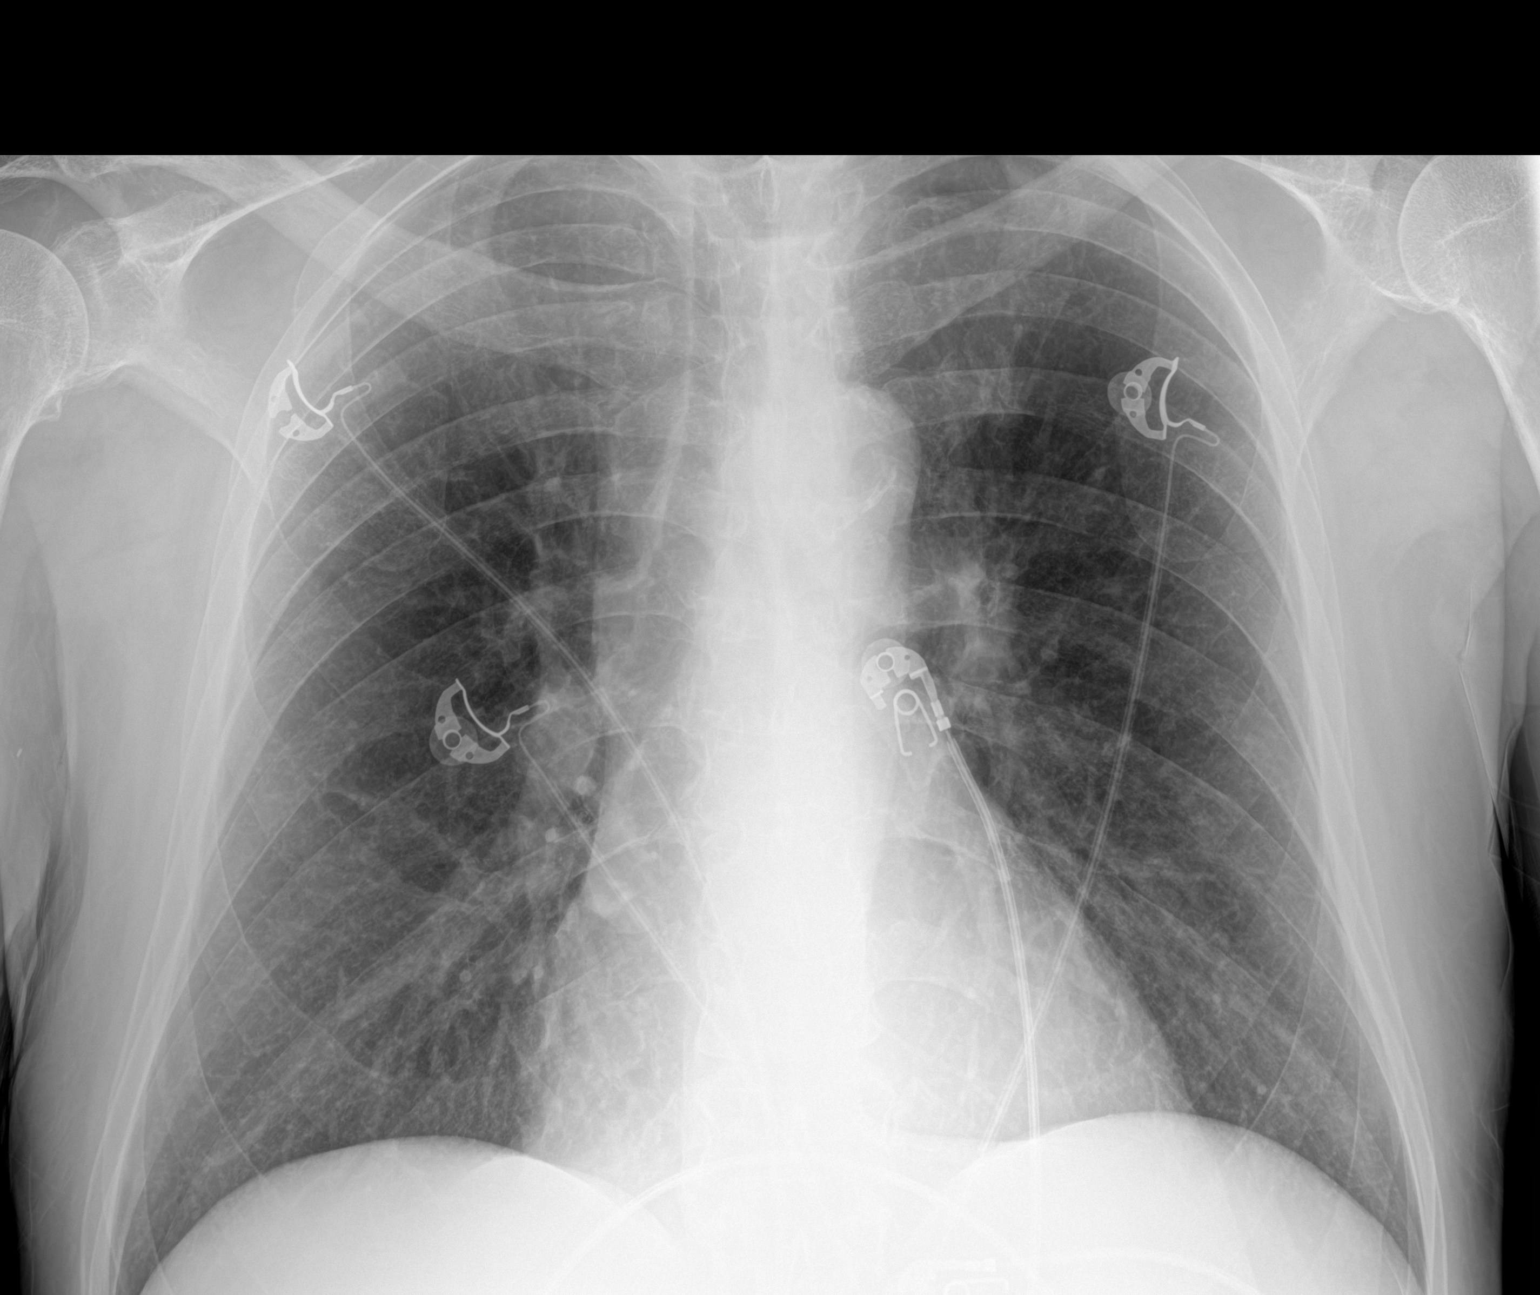

[1 of 1 positions shown; findings below may reference images not displayed]

FINDINGS: The heart size and mediastinal contours are within normal limits.
Aortic atherosclerosis. Both lungs are clear. The visualized
skeletal structures are unremarkable.
IMPRESSION: No active disease.

## 2020-07-26 MED ORDER — METRONIDAZOLE IN NACL 5-0.79 MG/ML-% IV SOLN
500.0000 mg | Freq: Once | INTRAVENOUS | Status: AC
  Administered 2020-07-27: 500 mg via INTRAVENOUS
  Filled 2020-07-26: qty 100

## 2020-07-26 MED ORDER — VANCOMYCIN HCL IN DEXTROSE 1-5 GM/200ML-% IV SOLN
1000.0000 mg | Freq: Once | INTRAVENOUS | Status: AC
  Administered 2020-07-27: 1000 mg via INTRAVENOUS
  Filled 2020-07-26: qty 200

## 2020-07-26 MED ORDER — SODIUM CHLORIDE 0.9 % IV SOLN
2.0000 g | Freq: Once | INTRAVENOUS | Status: AC
  Administered 2020-07-27: 2 g via INTRAVENOUS
  Filled 2020-07-26: qty 2

## 2020-07-26 MED ORDER — LACTATED RINGERS IV BOLUS (SEPSIS)
500.0000 mL | Freq: Once | INTRAVENOUS | Status: AC
  Administered 2020-07-27: 500 mL via INTRAVENOUS

## 2020-07-26 NOTE — ED Triage Notes (Signed)
Pt BIB [facility] for eval of potential sepsis. Pt was finishing at-home dialysis, & per wife became "barely arousable," with a low (70's-80') at the end of dialysis, was 90/40 on EMS arrival but came up to 126/70 with 158mL NS. Per EMS, pt was "burning up" Hx diabetes, 2-35mos old diabetic ulcers on bilateral heels, worse on L side, currently taking abx cream.  Pt CA&O4 on arrival, #18 L forearm

## 2020-07-27 ENCOUNTER — Inpatient Hospital Stay (HOSPITAL_COMMUNITY): Payer: BC Managed Care – PPO

## 2020-07-27 ENCOUNTER — Emergency Department (HOSPITAL_COMMUNITY): Payer: BC Managed Care – PPO

## 2020-07-27 ENCOUNTER — Telehealth: Payer: Self-pay | Admitting: *Deleted

## 2020-07-27 DIAGNOSIS — J449 Chronic obstructive pulmonary disease, unspecified: Secondary | ICD-10-CM | POA: Diagnosis present

## 2020-07-27 DIAGNOSIS — N2581 Secondary hyperparathyroidism of renal origin: Secondary | ICD-10-CM | POA: Diagnosis present

## 2020-07-27 DIAGNOSIS — E1169 Type 2 diabetes mellitus with other specified complication: Secondary | ICD-10-CM | POA: Diagnosis not present

## 2020-07-27 DIAGNOSIS — D631 Anemia in chronic kidney disease: Secondary | ICD-10-CM | POA: Diagnosis present

## 2020-07-27 DIAGNOSIS — L97509 Non-pressure chronic ulcer of other part of unspecified foot with unspecified severity: Secondary | ICD-10-CM | POA: Diagnosis present

## 2020-07-27 DIAGNOSIS — F17210 Nicotine dependence, cigarettes, uncomplicated: Secondary | ICD-10-CM | POA: Diagnosis present

## 2020-07-27 DIAGNOSIS — I340 Nonrheumatic mitral (valve) insufficiency: Secondary | ICD-10-CM | POA: Diagnosis not present

## 2020-07-27 DIAGNOSIS — M86172 Other acute osteomyelitis, left ankle and foot: Secondary | ICD-10-CM | POA: Diagnosis not present

## 2020-07-27 DIAGNOSIS — H409 Unspecified glaucoma: Secondary | ICD-10-CM | POA: Diagnosis present

## 2020-07-27 DIAGNOSIS — I9589 Other hypotension: Secondary | ICD-10-CM | POA: Diagnosis present

## 2020-07-27 DIAGNOSIS — I998 Other disorder of circulatory system: Secondary | ICD-10-CM | POA: Diagnosis not present

## 2020-07-27 DIAGNOSIS — R600 Localized edema: Secondary | ICD-10-CM | POA: Diagnosis present

## 2020-07-27 DIAGNOSIS — E876 Hypokalemia: Secondary | ICD-10-CM

## 2020-07-27 DIAGNOSIS — Z20822 Contact with and (suspected) exposure to covid-19: Secondary | ICD-10-CM | POA: Diagnosis present

## 2020-07-27 DIAGNOSIS — N186 End stage renal disease: Secondary | ICD-10-CM | POA: Diagnosis not present

## 2020-07-27 DIAGNOSIS — E1142 Type 2 diabetes mellitus with diabetic polyneuropathy: Secondary | ICD-10-CM | POA: Diagnosis present

## 2020-07-27 DIAGNOSIS — A419 Sepsis, unspecified organism: Secondary | ICD-10-CM | POA: Diagnosis not present

## 2020-07-27 DIAGNOSIS — E785 Hyperlipidemia, unspecified: Secondary | ICD-10-CM | POA: Diagnosis present

## 2020-07-27 DIAGNOSIS — B182 Chronic viral hepatitis C: Secondary | ICD-10-CM | POA: Diagnosis not present

## 2020-07-27 DIAGNOSIS — L97429 Non-pressure chronic ulcer of left heel and midfoot with unspecified severity: Secondary | ICD-10-CM

## 2020-07-27 DIAGNOSIS — E11621 Type 2 diabetes mellitus with foot ulcer: Secondary | ICD-10-CM | POA: Diagnosis present

## 2020-07-27 DIAGNOSIS — B1920 Unspecified viral hepatitis C without hepatic coma: Secondary | ICD-10-CM | POA: Diagnosis present

## 2020-07-27 DIAGNOSIS — E1122 Type 2 diabetes mellitus with diabetic chronic kidney disease: Secondary | ICD-10-CM | POA: Diagnosis present

## 2020-07-27 DIAGNOSIS — Z992 Dependence on renal dialysis: Secondary | ICD-10-CM | POA: Diagnosis not present

## 2020-07-27 DIAGNOSIS — L97419 Non-pressure chronic ulcer of right heel and midfoot with unspecified severity: Secondary | ICD-10-CM | POA: Diagnosis present

## 2020-07-27 DIAGNOSIS — Z7982 Long term (current) use of aspirin: Secondary | ICD-10-CM | POA: Diagnosis not present

## 2020-07-27 DIAGNOSIS — M549 Dorsalgia, unspecified: Secondary | ICD-10-CM | POA: Diagnosis not present

## 2020-07-27 DIAGNOSIS — L97529 Non-pressure chronic ulcer of other part of left foot with unspecified severity: Secondary | ICD-10-CM | POA: Diagnosis present

## 2020-07-27 DIAGNOSIS — E1165 Type 2 diabetes mellitus with hyperglycemia: Secondary | ICD-10-CM | POA: Diagnosis present

## 2020-07-27 LAB — BASIC METABOLIC PANEL
Anion gap: 11 (ref 5–15)
BUN: 30 mg/dL — ABNORMAL HIGH (ref 8–23)
CO2: 28 mmol/L (ref 22–32)
Calcium: 8.4 mg/dL — ABNORMAL LOW (ref 8.9–10.3)
Chloride: 92 mmol/L — ABNORMAL LOW (ref 98–111)
Creatinine, Ser: 4.67 mg/dL — ABNORMAL HIGH (ref 0.61–1.24)
GFR calc Af Amer: 14 mL/min — ABNORMAL LOW (ref >60)
GFR calc non Af Amer: 12 mL/min — ABNORMAL LOW (ref >60)
Glucose, Bld: 151 mg/dL — ABNORMAL HIGH (ref 70–99)
Potassium: 3 mmol/L — ABNORMAL LOW (ref 3.5–5.1)
Sodium: 131 mmol/L — ABNORMAL LOW (ref 135–145)

## 2020-07-27 LAB — URINALYSIS, ROUTINE W REFLEX MICROSCOPIC
Glucose, UA: 150 mg/dL — AB
Ketones, ur: NEGATIVE mg/dL
Leukocytes,Ua: NEGATIVE
Nitrite: NEGATIVE
Protein, ur: >=300 mg/dL — AB
Specific Gravity, Urine: 1.02 (ref 1.005–1.030)
pH: 5 (ref 5.0–8.0)

## 2020-07-27 LAB — CBG MONITORING, ED
Glucose-Capillary: 153 mg/dL — ABNORMAL HIGH (ref 70–99)
Glucose-Capillary: 205 mg/dL — ABNORMAL HIGH (ref 70–99)
Glucose-Capillary: 156 mg/dL — ABNORMAL HIGH (ref 70–99)

## 2020-07-27 LAB — CBC WITH DIFFERENTIAL/PLATELET
Abs Immature Granulocytes: 0.04 10*3/uL (ref 0.00–0.07)
Basophils Absolute: 0 10*3/uL (ref 0.0–0.1)
Basophils Relative: 0 %
Eosinophils Absolute: 0 10*3/uL (ref 0.0–0.5)
Eosinophils Relative: 0 %
HCT: 29.5 % — ABNORMAL LOW (ref 39.0–52.0)
Hemoglobin: 9.8 g/dL — ABNORMAL LOW (ref 13.0–17.0)
Immature Granulocytes: 0 %
Lymphocytes Relative: 10 %
Lymphs Abs: 1.2 10*3/uL (ref 0.7–4.0)
MCH: 30.7 pg (ref 26.0–34.0)
MCHC: 33.2 g/dL (ref 30.0–36.0)
MCV: 92.5 fL (ref 80.0–100.0)
Monocytes Absolute: 0.8 10*3/uL (ref 0.1–1.0)
Monocytes Relative: 7 %
Neutro Abs: 9.9 10*3/uL — ABNORMAL HIGH (ref 1.7–7.7)
Neutrophils Relative %: 83 %
Platelets: 158 10*3/uL (ref 150–400)
RBC: 3.19 MIL/uL — ABNORMAL LOW (ref 4.22–5.81)
RDW: 13.2 % (ref 11.5–15.5)
WBC: 12 10*3/uL — ABNORMAL HIGH (ref 4.0–10.5)
nRBC: 0 % (ref 0.0–0.2)

## 2020-07-27 LAB — COMPREHENSIVE METABOLIC PANEL
ALT: 24 U/L (ref 0–44)
AST: 53 U/L — ABNORMAL HIGH (ref 15–41)
Albumin: 3 g/dL — ABNORMAL LOW (ref 3.5–5.0)
Alkaline Phosphatase: 121 U/L (ref 38–126)
Anion gap: 12 (ref 5–15)
BUN: 25 mg/dL — ABNORMAL HIGH (ref 8–23)
CO2: 29 mmol/L (ref 22–32)
Calcium: 8.9 mg/dL (ref 8.9–10.3)
Chloride: 91 mmol/L — ABNORMAL LOW (ref 98–111)
Creatinine, Ser: 4.39 mg/dL — ABNORMAL HIGH (ref 0.61–1.24)
GFR calc Af Amer: 16 mL/min — ABNORMAL LOW (ref >60)
GFR calc non Af Amer: 13 mL/min — ABNORMAL LOW (ref >60)
Glucose, Bld: 167 mg/dL — ABNORMAL HIGH (ref 70–99)
Potassium: 2.9 mmol/L — ABNORMAL LOW (ref 3.5–5.1)
Sodium: 132 mmol/L — ABNORMAL LOW (ref 135–145)
Total Bilirubin: 0.7 mg/dL (ref 0.3–1.2)
Total Protein: 6.2 g/dL — ABNORMAL LOW (ref 6.5–8.1)

## 2020-07-27 LAB — HEMOGLOBIN A1C
Hgb A1c MFr Bld: 7.2 % — ABNORMAL HIGH (ref 4.8–5.6)
Mean Plasma Glucose: 159.94 mg/dL

## 2020-07-27 LAB — PROCALCITONIN: Procalcitonin: 0.55 ng/mL

## 2020-07-27 LAB — PROTIME-INR
INR: 1.2 (ref 0.8–1.2)
Prothrombin Time: 14.6 seconds (ref 11.4–15.2)

## 2020-07-27 LAB — LACTIC ACID, PLASMA: Lactic Acid, Venous: 1.1 mmol/L (ref 0.5–1.9)

## 2020-07-27 LAB — CBC
HCT: 29.1 % — ABNORMAL LOW (ref 39.0–52.0)
Hemoglobin: 9.9 g/dL — ABNORMAL LOW (ref 13.0–17.0)
MCH: 31.8 pg (ref 26.0–34.0)
MCHC: 34 g/dL (ref 30.0–36.0)
MCV: 93.6 fL (ref 80.0–100.0)
Platelets: 142 10*3/uL — ABNORMAL LOW (ref 150–400)
RBC: 3.11 MIL/uL — ABNORMAL LOW (ref 4.22–5.81)
RDW: 13.4 % (ref 11.5–15.5)
WBC: 10 10*3/uL (ref 4.0–10.5)
nRBC: 0 % (ref 0.0–0.2)

## 2020-07-27 LAB — GLUCOSE, CAPILLARY: Glucose-Capillary: 168 mg/dL — ABNORMAL HIGH (ref 70–99)

## 2020-07-27 LAB — APTT: aPTT: 30 seconds (ref 24–36)

## 2020-07-27 LAB — MAGNESIUM: Magnesium: 1.5 mg/dL — ABNORMAL LOW (ref 1.7–2.4)

## 2020-07-27 LAB — SARS CORONAVIRUS 2 BY RT PCR (HOSPITAL ORDER, PERFORMED IN ~~LOC~~ HOSPITAL LAB): SARS Coronavirus 2: NEGATIVE

## 2020-07-27 IMAGING — DX DG FOOT 2V*L*
2 series · 2 of 2 positions shown · non-contrast
Comparison: [DATE]

CLINICAL DATA: Foot infection, open wound

EXAM:
LEFT FOOT - 2 VIEW

[foot ap]
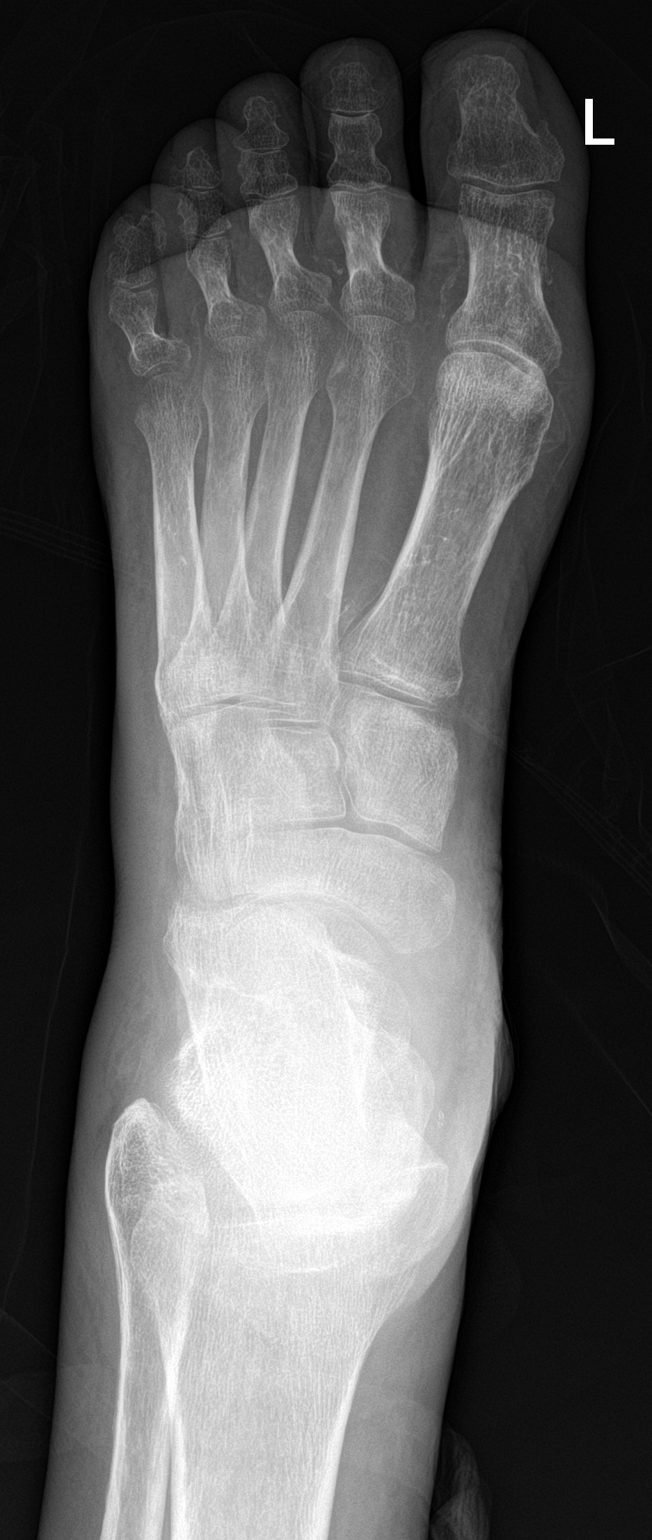

[foot lat]
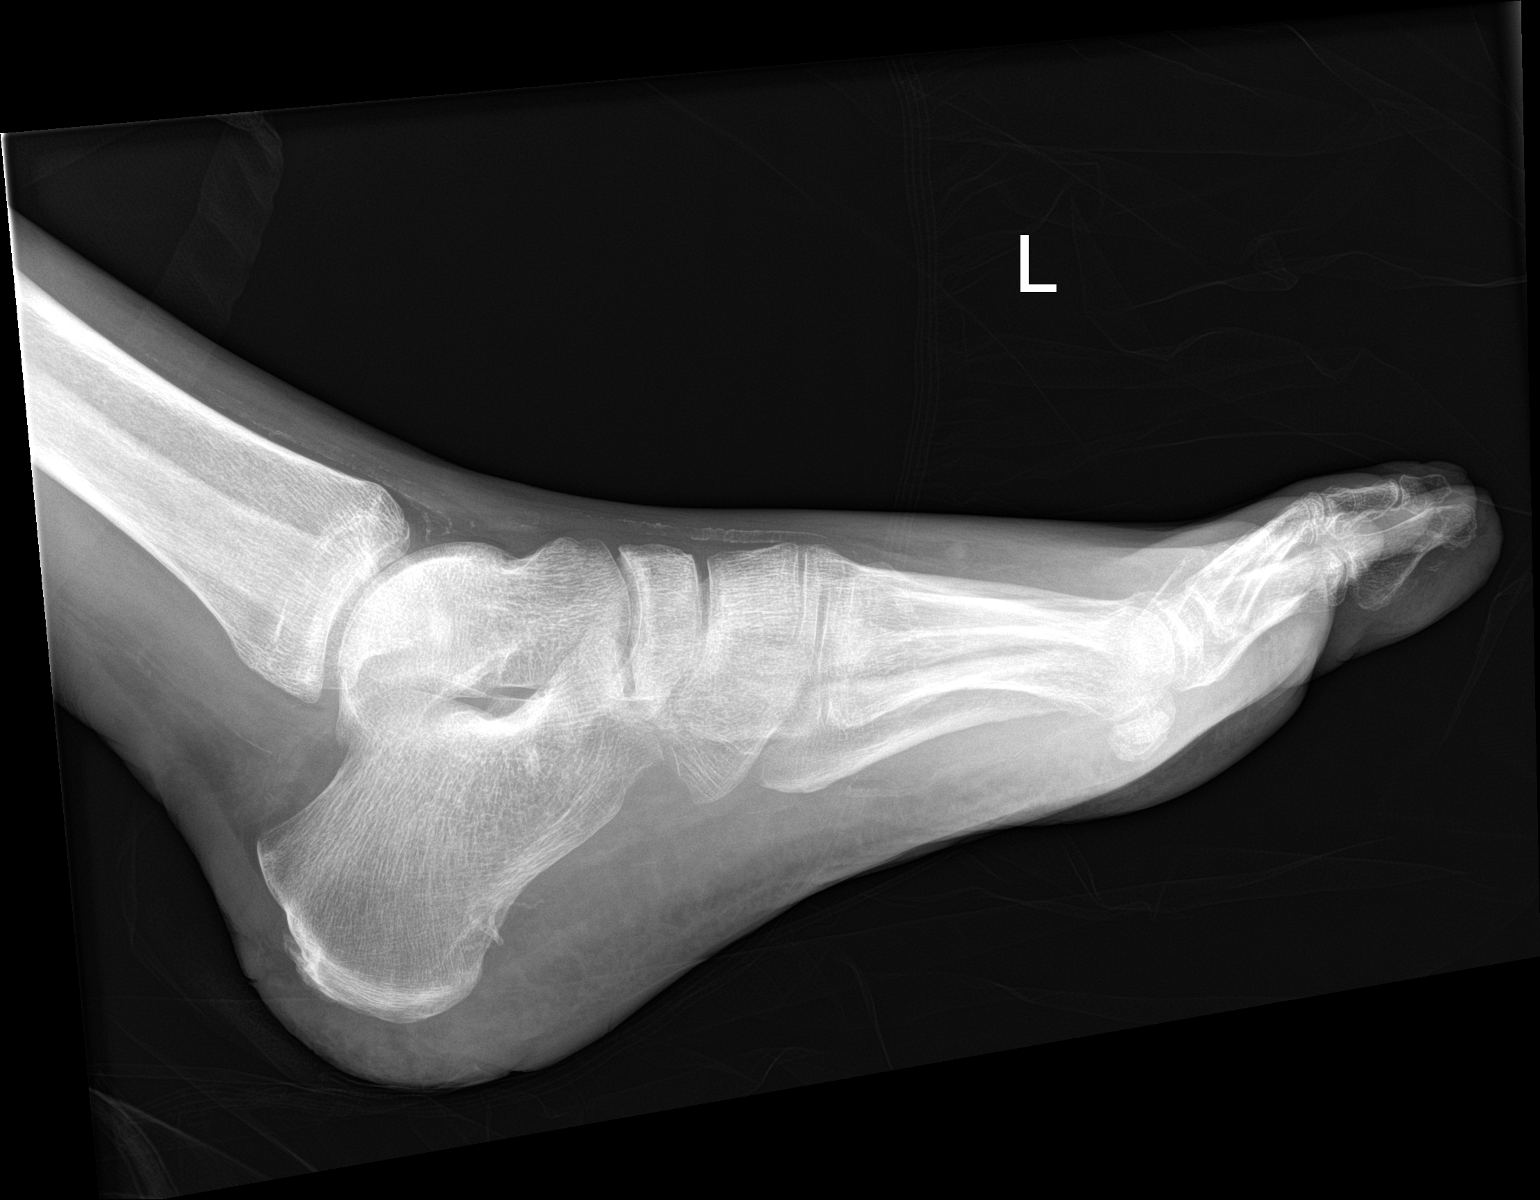

[2 of 2 positions shown; findings below may reference images not displayed]

FINDINGS: Soft tissue wound is noted over the heel. No definitive bony erosive
changes to suggest osteomyelitis are noted. No acute fracture or
dislocation is seen.
IMPRESSION: Soft tissue wound without acute findings of osteomyelitis.

## 2020-07-27 IMAGING — MR MR FOOT*L* W/O CM
4 of 7 series · 19 of 40 positions shown · non-contrast
Comparison: None.

CLINICAL DATA: Question of osteomyelitis with area of ulceration

EXAM:
MRI OF THE LEFT FOOT WITHOUT CONTRAST
TECHNIQUE: Multiplanar, multisequence MR imaging of the left hindfoot was
performed. No intravenous contrast was administered.

[Series 3: T1 · axial · 3.0mm · 0.35mm/px · z∈[-60,+90]mm · 6 of 44 slices shown (1 of 2)]
[im 1/44]
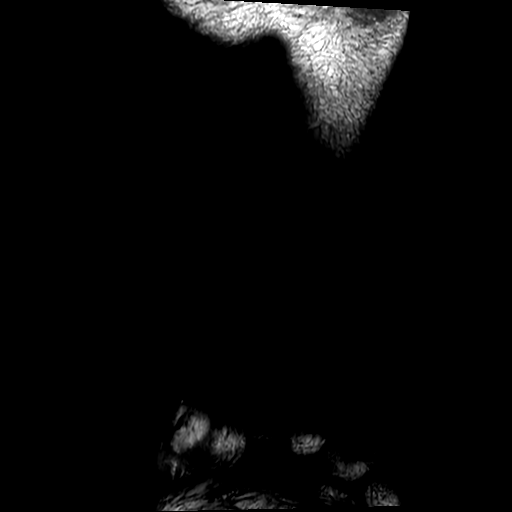
[im 9/44]
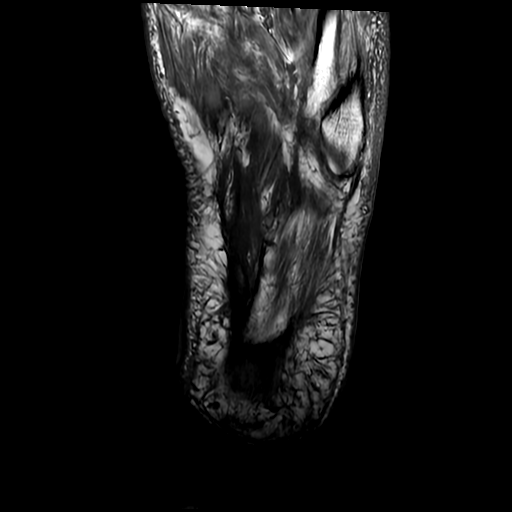
[im 18/44]
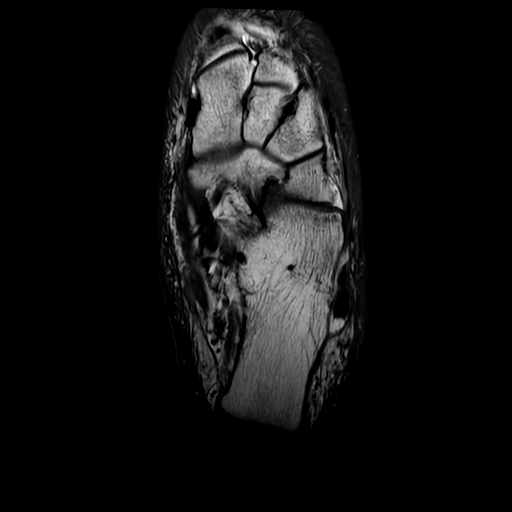
[im 26/44]
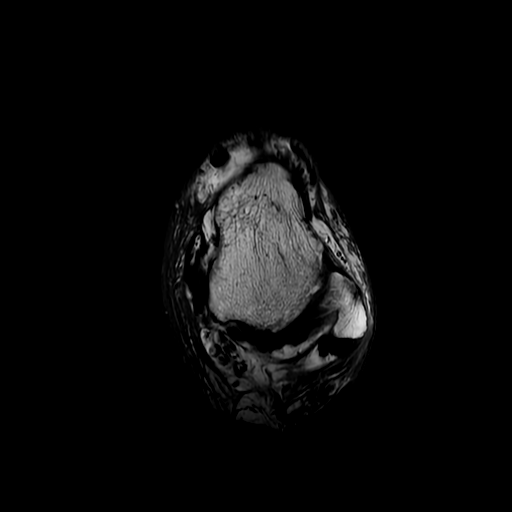
[im 35/44]
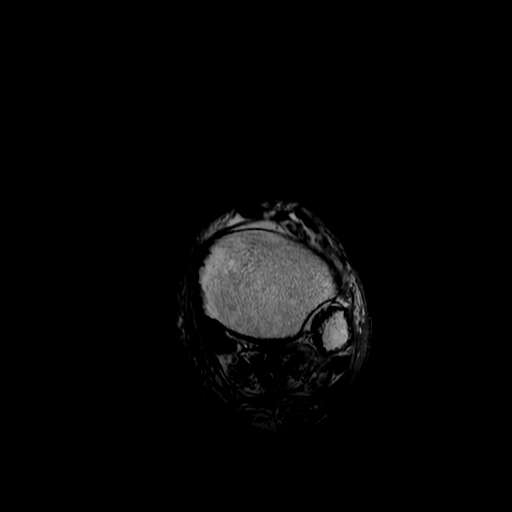
[im 44/44]
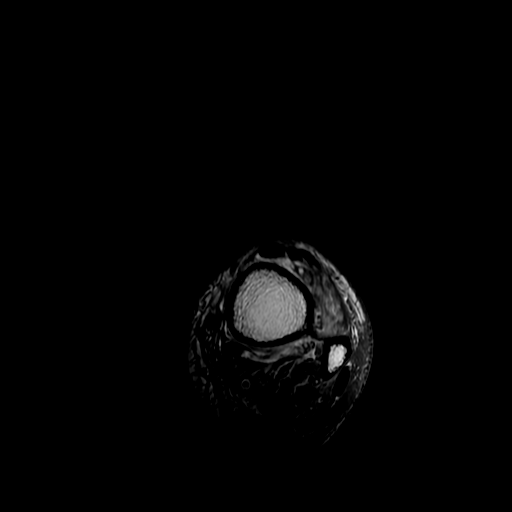

[Series 4: T1 fat-sat · axial · non-contrast · 3.0mm · 0.35mm/px · z∈[-60,+90]mm · 4 of 44 slices shown]
[im 1/44]
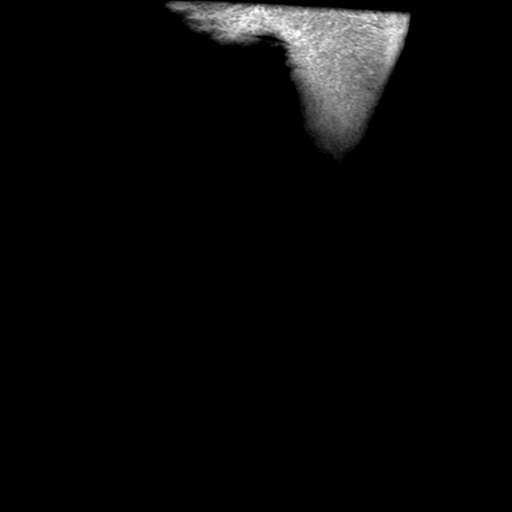
[im 9/44]
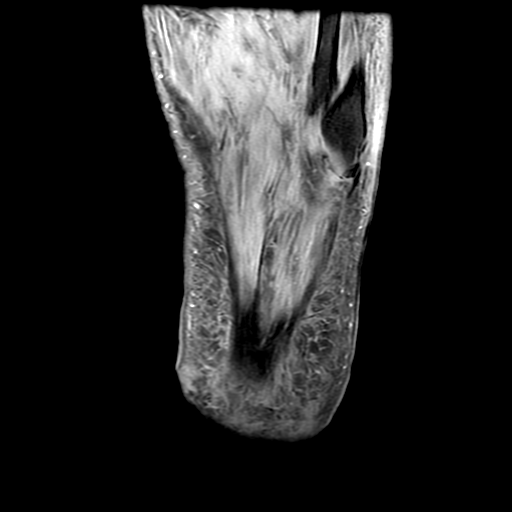
[im 26/44]
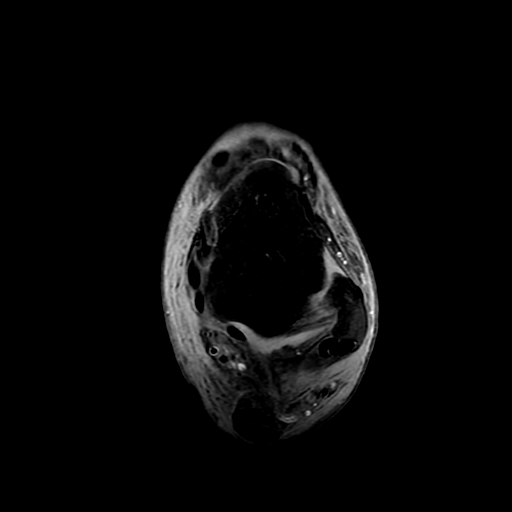
[im 44/44]
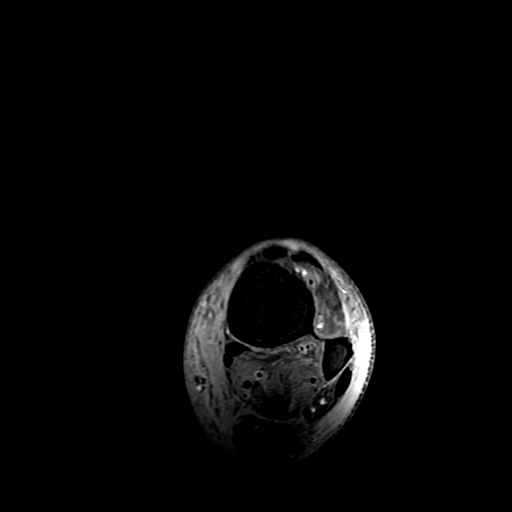

[Series 5: T1 · axial · 3.0mm · 0.35mm/px · z∈[-32,+90]mm · 3 of 44 slices shown (2 of 2)]
[im 9/44]
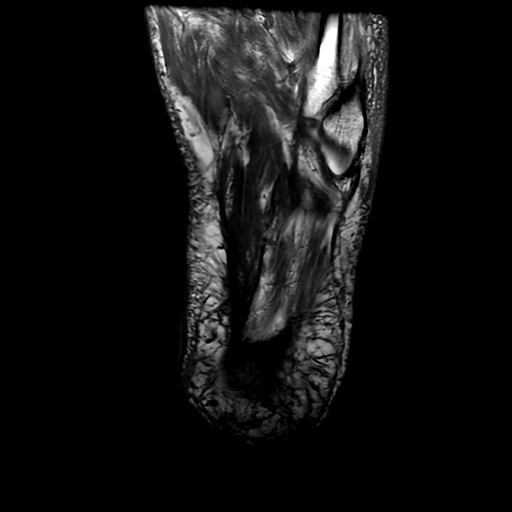
[im 26/44]
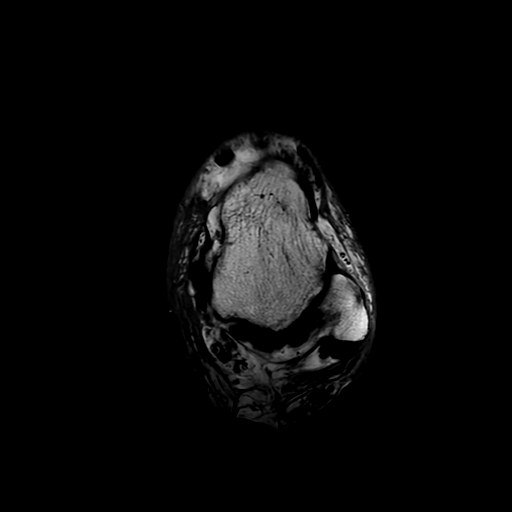
[im 44/44]
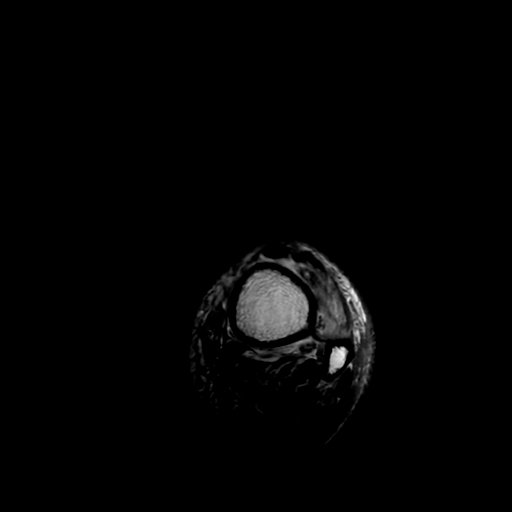

[Series 6: T2 fat-sat · axial · 3.0mm · 0.35mm/px · z∈[-60,+90]mm · 6 of 44 slices shown]
[im 1/44]
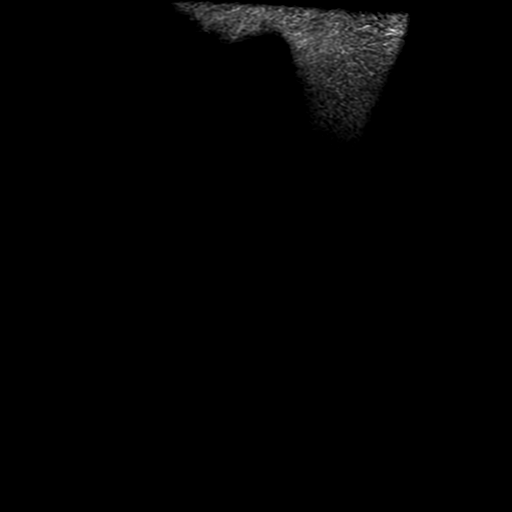
[im 9/44]
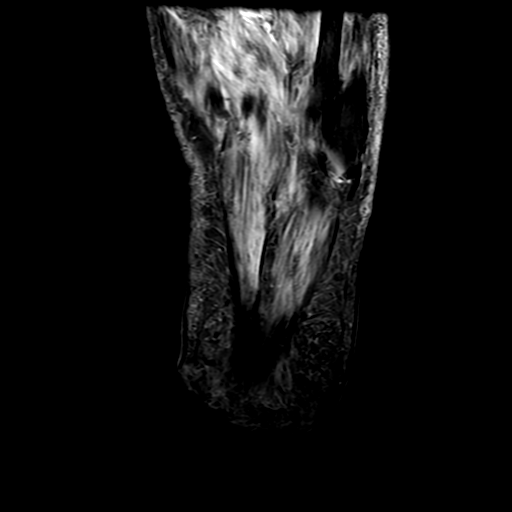
[im 18/44]
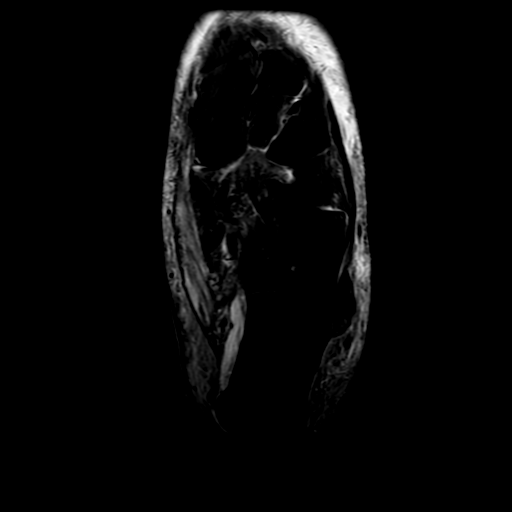
[im 26/44]
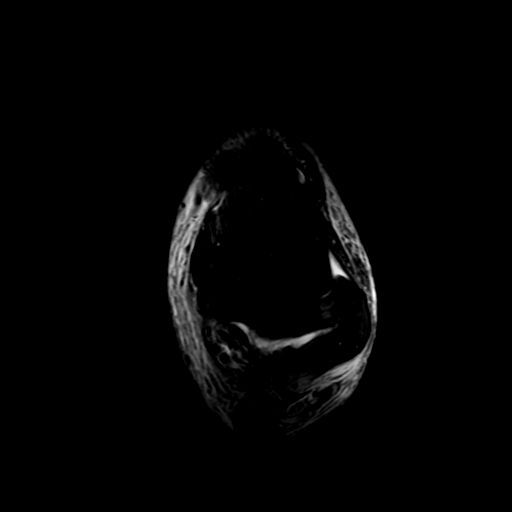
[im 35/44]
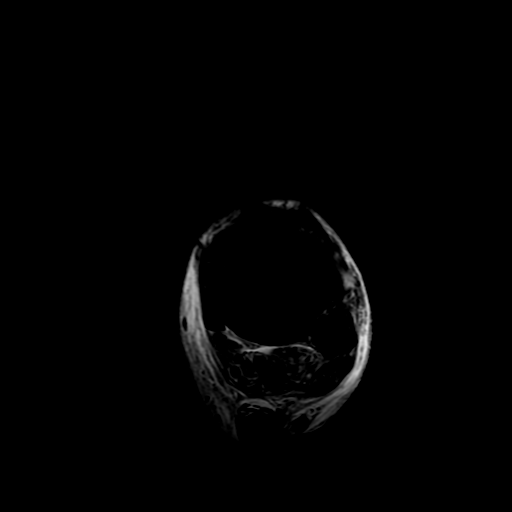
[im 44/44]
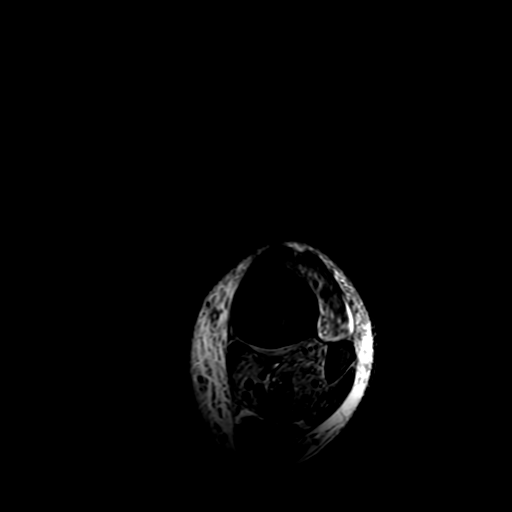

[19 of 40 positions shown; findings below may reference images not displayed]

FINDINGS: Bones/Joint/Cartilage

There is T2 hyperintense signal with subtle T1 hypointensity
involving the posterior calcaneus. There is subtle area of cortical
irregularity seen at the posterior calcaneus. No periosteal
reaction. No osseous fracture seen. Normal osseous marrow signal
seen throughout. No large joint effusion. There is trace subtalar
and ankle joint effusion.

Ligaments

Visualized portions are intact.

Muscles and Tendons

Mildly increased signal is seen throughout the ankle and foot with
diffuse mild fatty atrophy. The visualized portions of the flexor
and extensor tendons are intact. The Achilles tendon is intact.
Minimally increased signal seen at the insertion site of the
Achilles tendon. The plantar fascia appears to be intact.

Soft tissues

Area of superficial ulceration overlying the posterior calcaneus
with diffuse subcutaneous in heel pad inflammation. There is diffuse
skin thickening. No loculated fluid collections or sinus tract is
seen.
IMPRESSION: Area of superficial ulceration of the posterior calcaneus with
diffuse heel pad inflammation. No sinus tract or abscess.

Findings suggestive of acute osteomyelitis involving the posterior
calcaneus. No osseous fracture.

Mild Achilles tendinosis.

Diffuse muscular edema/denervation atrophy.

## 2020-07-27 MED ORDER — ROSUVASTATIN CALCIUM 5 MG PO TABS
10.0000 mg | ORAL_TABLET | Freq: Every day | ORAL | Status: DC
  Administered 2020-07-27 – 2020-08-01 (×6): 10 mg via ORAL
  Filled 2020-07-27 (×6): qty 2

## 2020-07-27 MED ORDER — RENA-VITE PO TABS
1.0000 | ORAL_TABLET | Freq: Every day | ORAL | Status: DC
  Administered 2020-07-27 – 2020-07-28 (×2): 1 via ORAL
  Filled 2020-07-27 (×3): qty 1

## 2020-07-27 MED ORDER — PANTOPRAZOLE SODIUM 40 MG PO TBEC
40.0000 mg | DELAYED_RELEASE_TABLET | Freq: Every day | ORAL | Status: DC
  Administered 2020-07-27 – 2020-07-29 (×3): 40 mg via ORAL
  Filled 2020-07-27 (×3): qty 1

## 2020-07-27 MED ORDER — POTASSIUM CHLORIDE CRYS ER 20 MEQ PO TBCR
40.0000 meq | EXTENDED_RELEASE_TABLET | Freq: Once | ORAL | Status: AC
  Administered 2020-07-27: 40 meq via ORAL
  Filled 2020-07-27: qty 2

## 2020-07-27 MED ORDER — MAGNESIUM OXIDE 400 (241.3 MG) MG PO TABS
400.0000 mg | ORAL_TABLET | Freq: Three times a day (TID) | ORAL | Status: DC
  Administered 2020-07-27 – 2020-08-01 (×14): 400 mg via ORAL
  Filled 2020-07-27 (×14): qty 1

## 2020-07-27 MED ORDER — NICOTINE 14 MG/24HR TD PT24
14.0000 mg | MEDICATED_PATCH | Freq: Every day | TRANSDERMAL | Status: DC
  Administered 2020-07-27 – 2020-08-01 (×6): 14 mg via TRANSDERMAL
  Filled 2020-07-27 (×6): qty 1

## 2020-07-27 MED ORDER — MIDODRINE HCL 5 MG PO TABS
10.0000 mg | ORAL_TABLET | ORAL | Status: DC
  Filled 2020-07-27 (×2): qty 2

## 2020-07-27 MED ORDER — CHLORHEXIDINE GLUCONATE CLOTH 2 % EX PADS
6.0000 | MEDICATED_PAD | Freq: Every day | CUTANEOUS | Status: DC
  Administered 2020-07-27 – 2020-07-29 (×2): 6 via TOPICAL

## 2020-07-27 MED ORDER — GABAPENTIN 300 MG PO CAPS
300.0000 mg | ORAL_CAPSULE | Freq: Every day | ORAL | Status: DC
  Administered 2020-07-27 – 2020-08-01 (×6): 300 mg via ORAL
  Filled 2020-07-27 (×6): qty 1

## 2020-07-27 MED ORDER — ACETAMINOPHEN 325 MG PO TABS: 650.0000 mg | ORAL_TABLET | Freq: Four times a day (QID) | ORAL | Status: DC | PRN

## 2020-07-27 MED ORDER — SODIUM CHLORIDE 0.9 % IV SOLN
1.0000 g | INTRAVENOUS | Status: DC
  Administered 2020-07-27: 1 g via INTRAVENOUS
  Filled 2020-07-27 (×2): qty 1

## 2020-07-27 MED ORDER — INSULIN ASPART 100 UNIT/ML ~~LOC~~ SOLN
0.0000 [IU] | Freq: Three times a day (TID) | SUBCUTANEOUS | Status: DC
  Administered 2020-07-27: 3 [IU] via SUBCUTANEOUS
  Administered 2020-07-27: 5 [IU] via SUBCUTANEOUS
  Administered 2020-07-27: 3 [IU] via SUBCUTANEOUS
  Administered 2020-07-28: 8 [IU] via SUBCUTANEOUS
  Administered 2020-07-28 – 2020-07-29 (×2): 3 [IU] via SUBCUTANEOUS
  Administered 2020-07-29: 5 [IU] via SUBCUTANEOUS
  Administered 2020-07-30: 11 [IU] via SUBCUTANEOUS
  Administered 2020-07-30 – 2020-07-31 (×2): 8 [IU] via SUBCUTANEOUS
  Administered 2020-07-31 (×2): 5 [IU] via SUBCUTANEOUS
  Administered 2020-08-01: 15 [IU] via SUBCUTANEOUS

## 2020-07-27 MED ORDER — HEPARIN SODIUM (PORCINE) 5000 UNIT/ML IJ SOLN
5000.0000 [IU] | Freq: Three times a day (TID) | INTRAMUSCULAR | Status: DC
  Administered 2020-07-27 – 2020-08-01 (×15): 5000 [IU] via SUBCUTANEOUS
  Filled 2020-07-27 (×15): qty 1

## 2020-07-27 MED ORDER — ASPIRIN EC 81 MG PO TBEC
81.0000 mg | DELAYED_RELEASE_TABLET | Freq: Every day | ORAL | Status: DC
  Administered 2020-07-27 – 2020-08-01 (×6): 81 mg via ORAL
  Filled 2020-07-27 (×6): qty 1

## 2020-07-27 MED ORDER — ACETAMINOPHEN 650 MG RE SUPP: 650.0000 mg | Freq: Four times a day (QID) | RECTAL | Status: DC | PRN

## 2020-07-27 MED ORDER — VANCOMYCIN HCL IN DEXTROSE 750-5 MG/150ML-% IV SOLN
750.0000 mg | INTRAVENOUS | Status: DC
  Administered 2020-07-29: 750 mg via INTRAVENOUS
  Filled 2020-07-27 (×3): qty 150

## 2020-07-27 MED ORDER — POLYETHYLENE GLYCOL 3350 17 G PO PACK: 17.0000 g | PACK | Freq: Every day | ORAL | Status: DC | PRN

## 2020-07-27 MED ORDER — VANCOMYCIN HCL 500 MG/100ML IV SOLN
500.0000 mg | Freq: Once | INTRAVENOUS | Status: AC
  Administered 2020-07-27: 500 mg via INTRAVENOUS
  Filled 2020-07-27: qty 100

## 2020-07-27 MED ORDER — INSULIN ASPART 100 UNIT/ML ~~LOC~~ SOLN
0.0000 [IU] | Freq: Every day | SUBCUTANEOUS | Status: DC
  Administered 2020-07-28 – 2020-07-29 (×2): 2 [IU] via SUBCUTANEOUS
  Administered 2020-07-30: 3 [IU] via SUBCUTANEOUS

## 2020-07-27 MED ORDER — CLOPIDOGREL BISULFATE 75 MG PO TABS
75.0000 mg | ORAL_TABLET | Freq: Every day | ORAL | Status: DC
  Administered 2020-07-27 – 2020-08-01 (×6): 75 mg via ORAL
  Filled 2020-07-27 (×6): qty 1

## 2020-07-27 NOTE — Telephone Encounter (Signed)
I spoke to [name] and she states pt spiked fever and became lethargic, dialysis states call 911 to get pt evaluated. Duane Hall is now in hospital waiting on a room, to treat pt with for sepsis and foot infection. I told her to inform the hospital pt is being seen by Dr. Sherryle Lis and put in the referral.

## 2020-07-27 NOTE — Progress Notes (Signed)
PROGRESS NOTE    Duane Hall  URK:[id number] DOB: Apr 22, 1956 DOA: 07/26/2020 PCP: Pieter Partridge, PA    Brief Narrative:  Patient admitted to the hospital with a working diagnosis of sepsis due to diabetic foot ulcer, end-organ failure hypotension.   64 year old male with significant past medical history for end-stage renal disease on hemodialysis who presents with confusion and low blood pressure.  He was brought to the hospital due to worsening hypotension after hemodialysis. Reported worsening edema, erythema and odor at the wound site for several days.  He had a recent admission for diabetic foot ulcer, related to peripheral vascular disease, s/p endarterectomy and balloon angioplasty to the left superior femoral artery.  After administration of intravenous fluids blood pressure 131/58, pulse rate 82, respirate 17, temperature 98.7, oxygenation 99%.  His lungs were clear to auscultation bilaterally, heart S1-S2, present rhythmic, soft abdomen, no lower extremity edema. Left foot with loca edema and large heal ulcer with necrotic scar, positive erythema and local tenderness along with increased local temperature.  Sodium 132, potassium 2.9, chloride 91, bicarb 29, glucose 167, BUN 25, creatinine 4.39, lactic acid 1.1, white count 12.0, hemoglobin 9.8, hematocrit 29.5, platelets 158.  SARS COVID-19 negative.  Urinalysis 11-20 white cells, 11-20 red cells, specific gravity 1.020, > 300 protein.  Chest radiograph with hyperinflation, bibasilar atelectasis.  EKG 90 bpm, normal axis, normal intervals, sinus rhythm, Q wave V1-V2, no ST segment or T wave changes, positive LVH.     Assessment & Plan:   Principal Problem:   Sepsis ([place]) Active Problems:   Type II diabetes mellitus ([place])   Hypertension   Hepatitis C   Hypokalemia   Vascular insufficiency of extremity   Diabetic foot ulcer ([place])   ESRD (end stage renal disease) ([place])   1. Left foot ulcer infection in the setting of  peripheral vascular disease, complicated with sepsis, end-organ failure hypotension (present on admission). Foot films with no bony lesions. He has a large unstable ulcer at the heal.   Continue antibiotic therapy with cefepime and vancomycin. Further work up with foot MRI. Consult [facility], they have been following the patient as outpatient.   2. ESRD on HD/ with hypokalemia/ hypomagnesemia/ hyponatremia. Patient euvolemic, K this am is 3.0 with serum bicarbonate at 28.   Will give 40 meq of Kcl, 400 mag oxide tid and will follow renal function in am. Nephrology has been consulted for routine HD.   3. T2DM/ dyslipidemia. Fasting glucose this am 151, will continue insulin sliding scale for glucose cover and monitoring. Patient is tolerating po well.   Continue with rosuvastatin   4. HTN. Patient on midodrine for hypotension. His EKG has signs of LVH, will get echocardiogram to assess LV systolic function and valvular apparatus.     Patient continue to be at high risk for worsening sepsis   Status is: Inpatient  Remains inpatient appropriate because:IV treatments appropriate due to intensity of illness or inability to take PO   Dispo: The patient is from: Home              Anticipated d/c is to: Home              Anticipated d/c date is: 2 days              Patient currently is not medically stable to d/c.   DVT prophylaxis: Heparin   Code Status:   full  Family Communication:  I spoke over the phone with  the patient's wife about patient's  condition, plan of care, prognosis and all questions were addressed.     Antimicrobials:   Vancomycin   Cefepime     Subjective: Patient reports pain and purulence from heal wound at home, with positive odor. No nausea or vomiting, no chest pain or dyspnea,   Objective: Vitals:   07/27/20 1200 07/27/20 1300 07/27/20 1400 07/27/20 1500  BP: (!) 95/49 (!) 133/67 (!) 113/55 (!) 125/58  Pulse: 77 88 83 84  Resp: 13 17 17 17    Temp:      TempSrc:      SpO2: 96% 97% 99% 100%  Weight:      Height:        Intake/Output Summary (Last 24 hours) at 07/27/2020 1544 Last data filed at 07/26/2020 2310 Gross per 24 hour  Intake 25 ml  Output --  Net 25 ml   Filed Weights   07/26/20 2318  Weight: 70.3 kg    Examination:   General: deconditioned and ill looking appearing  Neurology: Awake and alert, non focal  E ENT: mild pallor, no icterus, oral mucosa moist Cardiovascular: No JVD. S1-S2 present, rhythmic, no gallops, rubs, or murmurs. Trace lower extremity edema. Pulmonary: positive breath sounds bilaterally, adequate air movement, no wheezing, rhonchi or rales. Gastrointestinal. Abdomen soft with, no organomegaly, non tender, no rebound or guarding Skin. Large heal ulcer with dark scar, not able to stage, positive erythema, edema and local tenderness.  Musculoskeletal: no joint deformities         Data Reviewed: I have personally reviewed following labs and imaging studies  CBC: Recent Labs  Lab 07/26/20 2345 07/27/20 0639  WBC 12.0* 10.0  NEUTROABS 9.9*  --   HGB 9.8* 9.9*  HCT 29.5* 29.1*  MCV 92.5 93.6  PLT 158 852*   Basic Metabolic Panel: Recent Labs  Lab 07/26/20 2345 07/27/20 0639  NA 132* 131*  K 2.9* 3.0*  CL 91* 92*  CO2 29 28  GLUCOSE 167* 151*  BUN 25* 30*  CREATININE 4.39* 4.67*  CALCIUM 8.9 8.4*  MG  --  1.5*   GFR: Estimated Creatinine Clearance: 16.3 mL/min (A) (by C-G formula based on SCr of 4.67 mg/dL (H)). Liver Function Tests: Recent Labs  Lab 07/26/20 2345  AST 53*  ALT 24  ALKPHOS 121  BILITOT 0.7  PROT 6.2*  ALBUMIN 3.0*   No results for input(s): LIPASE, AMYLASE in the last 168 hours. No results for input(s): AMMONIA in the last 168 hours. Coagulation Profile: Recent Labs  Lab 07/26/20 2345  INR 1.2   Cardiac Enzymes: No results for input(s): CKTOTAL, CKMB, CKMBINDEX, TROPONINI in the last 168 hours. BNP (last 3 results) No results for  input(s): PROBNP in the last 8760 hours. HbA1C: Recent Labs    07/27/20 0639  HGBA1C 7.2*   CBG: Recent Labs  Lab 07/27/20 0751 07/27/20 1214  GLUCAP 153* 205*   Lipid Profile: No results for input(s): CHOL, HDL, LDLCALC, TRIG, CHOLHDL, LDLDIRECT in the last 72 hours. Thyroid Function Tests: No results for input(s): TSH, T4TOTAL, FREET4, T3FREE, THYROIDAB in the last 72 hours. Anemia Panel: No results for input(s): VITAMINB12, FOLATE, FERRITIN, TIBC, IRON, RETICCTPCT in the last 72 hours.    Radiology Studies: I have reviewed all of the imaging during this hospital visit personally     Scheduled Meds: . aspirin EC  81 mg Oral Daily  . [START ON 07/28/2020] Chlorhexidine Gluconate Cloth  6 each Topical Q0600  . clopidogrel  75 mg Oral Q breakfast  . gabapentin  300 mg Oral Daily  . heparin  5,000 Units Subcutaneous Q8H  . insulin aspart  0-15 Units Subcutaneous TID WC  . insulin aspart  0-5 Units Subcutaneous QHS  . midodrine  10 mg Oral Once per day on Sun Tue Thu Sat  . multivitamin  1 tablet Oral QHS  . nicotine  14 mg Transdermal Daily  . pantoprazole  40 mg Oral Daily  . rosuvastatin  10 mg Oral Daily   Continuous Infusions: . ceFEPime (MAXIPIME) IV       LOS: 0 days        Asmara Backs Gerome Apley, MD

## 2020-07-27 NOTE — ED Notes (Signed)
Patient transported to MRI 

## 2020-07-27 NOTE — ED Notes (Signed)
[phone] Duane Hall wife would like an update please

## 2020-07-27 NOTE — H&P (Signed)
PCP:   Pieter Partridge, PA   Chief Complaint: Hypotension and confusion   HPI: This is a 64 year old male with end-stage renal disease on home dialysis.  Today after dialysis he became confused and was hypotensive.  The patient is usually hypotensive after hemodialysis but this was more so than usual.  This coupled with his confusion his wife decided to take him to the ER.  The patient is unable to provide any history stating his last seen here members was going on hemodialysis.  He denies being significantly ill today.  He does report some chills but not much of anything else.  He has a diabetic foot ulcer which he states has been getting better.  He reports pain in the extremity but states his pain is not getting worse.  The patient was recently admitted here with the diabetic foot ulcer.  He was seen by podiatry and it was debrided, started on antibiotics.  The extremity was looking better.  Vascular surgery Dr. Donzetta Matters took him for an angiogram and placed a stent. They state that his right leg had about 50% blockage, and the left leg had up to 90% blockage in the thigh.  The pain in his thigh and calf was almost completely gone after the vascular intervention.  He was started on aspirin and statins The redness was improving.  In route to the hospital he received IV fluid boluses.  His blood pressure has been more stable but low here in the ER, systolic blood pressures in the high 90s.  His left foot diabetic ulcer today has an eschar all over which was not present at discharge approximately 2 weeks ago.    The hospitalist have been asked admit for sepsis likely due to his extremity  Review of Systems:  The patient denies anorexia, fever, weight loss,, vision loss, decreased hearing, hoarseness, chest pain, syncope, dyspnea on exertion, peripheral edema, balance deficits, hemoptysis, abdominal pain, melena, hematochezia, severe indigestion/heartburn, hematuria, incontinence, genital sores, muscle  weakness, suspicious skin lesions, transient blindness, difficulty walking, depression, unusual weight change, abnormal bleeding, enlarged lymph nodes, angioedema, and breast masses. Positive: Acute confusion  Past Medical History: Past Medical History:  Diagnosis Date  . Asthma   . Cancer ([place])   . Cataract   . CKD (chronic kidney disease), stage III   . Diabetes mellitus   . Glaucoma   . Vascular insufficiency 05/2020   Past Surgical History:  Procedure Laterality Date  . ABDOMINAL AORTOGRAM W/LOWER EXTREMITY N/A 06/19/2020   Procedure: ABDOMINAL AORTOGRAM W/LOWER EXTREMITY;  Surgeon: Waynetta Sandy, MD;  Location: [facility];  Service: Cardiovascular;  Laterality: N/A;  . AV FISTULA PLACEMENT Right 09/14/2019   Procedure: Right Arm Basilic Vein transposition;  Surgeon: Angelia Mould, MD;  Location: [facility];  Service: Vascular;  Laterality: Right;  . COLONOSCOPY    . PERIPHERAL VASCULAR ATHERECTOMY Left 06/19/2020   Procedure: PERIPHERAL VASCULAR ATHERECTOMY;  Surgeon: Waynetta Sandy, MD;  Location: [facility];  Service: Cardiovascular;  Laterality: Left;  SFA  . UPPER GASTROINTESTINAL ENDOSCOPY  02/2019   Dr Benson [place]      Medications: Prior to Admission medications   Medication Sig Start Date End Date Taking? Authorizing Provider  aspirin EC 81 MG EC tablet Take 1 tablet (81 mg total) by mouth daily. Swallow whole. 06/21/20  Yes Charlynne Cousins, MD  clopidogrel (PLAVIX) 75 MG tablet Take 1 tablet (75 mg total) by mouth daily with breakfast. 06/21/20  Yes Venetia Constable  Marguarite Arbour, MD  collagenase (SANTYL) ointment Apply 1 application topically daily. Apply to the left heel daily with a layer the thickness of a nickel 06/29/20  Yes McDonald, Stephan Minister, DPM  ferric citrate (AURYXIA) 1 GM 210 MG(Fe) tablet Take 210 mg by mouth 3 (three) times daily with meals.    Yes [provider]  gabapentin (NEURONTIN) 100 MG capsule Take 300 mg by mouth  daily. 05/16/20  Yes [provider]  glipiZIDE (GLUCOTROL) 10 MG tablet Take 10 mg by mouth 2 (two) times daily. 04/07/20  Yes [provider]  lidocaine-prilocaine (EMLA) cream Apply 1 application topically See admin instructions. Apply topically to port access one hour prior to dialysis on Sunday, Monday, Wednesday, Thursday 05/12/20  Yes [provider]  midodrine (PROAMATINE) 10 MG tablet Take 10-20 mg by mouth 4 (four) times a week. Take 1 tablet on dialysis days (Sunday, Monday, Wednesday, and Thursday.). May take an additional tablet after dialysis for blood pressure. 07/19/20  Yes [provider]  multivitamin (RENA-VIT) TABS tablet Take 1 tablet by mouth daily.   Yes [provider]  mupirocin ointment (BACTROBAN) 2 % Apply 1 application topically See admin instructions. Apply topically to port access after dialysis - Sunday, Monday, Wednesday, Thursday 05/17/20  Yes [provider]  omeprazole (PRILOSEC) 40 MG capsule Take 40 mg by mouth daily.   Yes [provider]  rosuvastatin (CRESTOR) 10 MG tablet Take 1 tablet (10 mg total) by mouth daily. 07/25/20  Yes Waynetta Sandy, MD  sennosides-docusate sodium (SENOKOT-S) 8.6-50 MG tablet Take 1 tablet by mouth daily as needed for constipation.   Yes [provider]  Methoxy PEG-Epoetin Beta (MIRCERA IJ) Inject into the skin. 07/03/20   [provider]    Allergies:  No Known Allergies  Social History:  reports that he has been smoking cigarettes. He has been smoking about 0.80 packs per day. He has never used smokeless tobacco. He reports that he does not drink alcohol and does not use drugs.  Family History: Family History  Problem Relation Age of Onset  . Cancer Father     Physical Exam: Vitals:   07/27/20 0123 07/27/20 0200 07/27/20 0230 07/27/20 0259  BP: (!) 131/58 (!) 137/65 (!) 119/64   Pulse: 82 73 71   Resp: 17 12 13    Temp:    98.7 F  (37.1 C)  TempSrc:    Oral  SpO2: 100% 99% 99%   Weight:      Height:        General:  Alert and oriented times three, well developed and nourished, no acute distress Eyes: PERRLA, pink conjunctiva, no scleral icterus ENT: Moist oral mucosa, neck supple, no thyromegaly Lungs: clear to ascultation, no wheeze, no crackles, no use of accessory muscles Cardiovascular: regular rate and rhythm, no regurgitation, no gallops, no murmurs. No carotid bruits, no JVD Abdomen: soft, positive BS, non-tender, non-distended, no organomegaly, not an acute abdomen GU: not examined Neuro: CN II - XII grossly intact, sensation intact Musculoskeletal: strength 5/5 all extremities, no clubbing, cyanosis or edema Skin: no rash, no subcutaneous crepitation, no decubitus Psych: appropriate patient Left heel: large diabetic foot ulcer, with large eschar. Not fluctuant or erythematous. No extremity pulse palpated   Labs on Admission:  Recent Labs    07/26/20 2345  NA 132*  K 2.9*  CL 91*  CO2 29  GLUCOSE 167*  BUN 25*  CREATININE 4.39*  CALCIUM 8.9   Recent Labs  07/26/20 2345  AST 53*  ALT 24  ALKPHOS 121  BILITOT 0.7  PROT 6.2*  ALBUMIN 3.0*   No results for input(s): LIPASE, AMYLASE in the last 72 hours. Recent Labs    07/26/20 2345  WBC 12.0*  NEUTROABS 9.9*  HGB 9.8*  HCT 29.5*  MCV 92.5  PLT 158   No results for input(s): CKTOTAL, CKMB, CKMBINDEX, TROPONINI in the last 72 hours. Invalid input(s): POCBNP No results for input(s): DDIMER in the last 72 hours. No results for input(s): HGBA1C in the last 72 hours. No results for input(s): CHOL, HDL, LDLCALC, TRIG, CHOLHDL, LDLDIRECT in the last 72 hours. No results for input(s): TSH, T4TOTAL, T3FREE, THYROIDAB in the last 72 hours.  Invalid input(s): FREET3 No results for input(s): VITAMINB12, FOLATE, FERRITIN, TIBC, IRON, RETICCTPCT in the last 72 hours.  Micro Results: No results found for this or any previous visit (from  the past 240 hour(s)).   Radiological Exams on Admission: DG Chest Port 1 View  Result Date: 07/26/2020 CLINICAL DATA:  Fever EXAM: PORTABLE CHEST 1 VIEW COMPARISON:  09/08/2019 FINDINGS: The heart size and mediastinal contours are within normal limits. Aortic atherosclerosis. Both lungs are clear. The visualized skeletal structures are unremarkable. IMPRESSION: No active disease. Electronically Signed   By: Donavan Foil M.D.   On: 07/26/2020 23:39   DG Foot 2 Views Left  Result Date: 07/27/2020 CLINICAL DATA:  Foot infection, open wound EXAM: LEFT FOOT - 2 VIEW COMPARISON:  06/18/2020 FINDINGS: Soft tissue wound is noted over the heel. No definitive bony erosive changes to suggest osteomyelitis are noted. No acute fracture or dislocation is seen. IMPRESSION: Soft tissue wound without acute findings of osteomyelitis. Electronically Signed   By: Inez Catalina M.D.   On: 07/27/2020 03:15    Assessment/Plan Present on Admission: . Diabetic foot ulcer ([place]) . Sepsis ([place]) . Vascular insufficiency of extremity -Admit to MedSurg -Blood cultures x2 -IV antibiotics x2 -IV Rocephin, Vanco and Flagyl ordered -Podiatry consult per a.m. team -Wound care consult placed -Pain meds as needed -1st dose of midodrine now  . Hypokalemia -Repeat BMP in a.m.  Monitor with hemodialysis.  If goes lower will replete.  End-stage renal disease -We converted to hemodialysis while hospitalized -Nephrology consult in a.m.  Team  Hypotension -Somewhat chronic but currently worse than baseline., on midodrine at home.  This has been reordered post hemodialysis, first dose ordered for now.    Tobacco abuse -Nicotine patch, DuoNebs, oxygen  COPD -Stable, home meds resumed  . Hypertension -Currently hypotensive  Diabetes mellitus -Sliding scale insulin ordered  . Hepatitis C -   Letita Prentiss 07/27/2020, 3:41 AM

## 2020-07-27 NOTE — ED Notes (Signed)
MS Breakfast Ordered 

## 2020-07-27 NOTE — Progress Notes (Signed)
I came to see Duane Hall in his ED room. Unfortunately he was away at MRI. I discussed his case and recent history with his wife Duane Hall and we reviewed the wound photos.  - I will see him tomorrow afternoon on the floor, full consult exam and note to follow at that time - For now, OK with betadine recommendation and dry sterile dressing for the wounds - Keep heels and bony prominences offloaded at all times - Will f/u on MRI read - Agree with continued sepsis workup and empiric antibiotics. The photos and discussion with his wife seem similar to his recent outpatient history but his ulcer is very high risk for development of osteomyelitis and is the most likely source, although his dialysis access is also a possibility.   Available via secure chat if questions/concerns.   Lanae Crumbly, DPM 07/27/2020

## 2020-07-27 NOTE — ED Provider Notes (Signed)
[facility] EMERGENCY DEPARTMENT Provider Note   CSN: [id number] Arrival date & time: 07/26/20  2306     History Chief Complaint  Patient presents with  . suspected sepsis    Duane Hall is a 64 y.o. male.  Patient presents to the emergency department for evaluation of generalized weakness, confusion, low blood pressure.  Patient does home dialysis.  He reportedly gets low blood pressure upon completion of dialysis, but tonight seemed very altered to his wife.  EMS was alerted.  They do report he had low blood pressure, gave him a small amount of IV fluid.  Patient was noted to be febrile upon their arrival.  Patient reports ongoing problems with an infection of the left heel from pressure ulcer.  He is not currently on antibiotics.  He feels weak but otherwise without complaints.  Denies nausea, vomiting, diarrhea.  Has not had any cough, chest pain or shortness of breath.        Past Medical History:  Diagnosis Date  . Asthma   . Cancer ([place])   . Cataract   . CKD (chronic kidney disease), stage III   . Diabetes mellitus   . Glaucoma   . Vascular insufficiency 05/2020    Patient Active Problem List   Diagnosis Date Noted  . Vascular insufficiency of extremity 06/18/2020  . Awaiting transplantation of kidney 06/18/2020  . Foot ulcer, left ([place]) 06/18/2020  . Hypokalemia 09/09/2019  . Volume overload 09/08/2019  . Diabetic retinopathy ([place]) 08/25/2015  . Claudication ([place]) 04/07/2015  . Anemia of chronic disease 09/12/2014  . CKD (chronic kidney disease) 08/03/2014  . Hepatitis C 10/15/2013  . Pain in joint, shoulder region 02/10/2013  . Type II diabetes mellitus ([place]) 02/24/2012  . Hypertension 02/24/2012  . Asthma 02/24/2012    Past Surgical History:  Procedure Laterality Date  . ABDOMINAL AORTOGRAM W/LOWER EXTREMITY N/A 06/19/2020   Procedure: ABDOMINAL AORTOGRAM W/LOWER EXTREMITY;  Surgeon: Waynetta Sandy, MD;  Location: [place] [facility];  Service: Cardiovascular;  Laterality: N/A;  . AV FISTULA PLACEMENT Right 09/14/2019   Procedure: Right Arm Basilic Vein transposition;  Surgeon: Angelia Mould, MD;  Location: [place];  Service: Vascular;  Laterality: Right;  . COLONOSCOPY    . PERIPHERAL VASCULAR ATHERECTOMY Left 06/19/2020   Procedure: PERIPHERAL VASCULAR ATHERECTOMY;  Surgeon: Waynetta Sandy, MD;  Location: [place] [facility];  Service: Cardiovascular;  Laterality: Left;  SFA  . UPPER GASTROINTESTINAL ENDOSCOPY  02/2019   Dr Benson Norway         Family History  Problem Relation Age of Onset  . Cancer Father     Social History   Tobacco Use  . Smoking status: Current Every Day Smoker    Packs/day: 0.80    Types: Cigarettes  . Smokeless tobacco: Never Used  Vaping Use  . Vaping Use: Never used  Substance Use Topics  . Alcohol use: No    Alcohol/week: 0.0 standard drinks  . Drug use: No    Home Medications Prior to Admission medications   Medication Sig Start Date End Date Taking? Authorizing Provider  aspirin EC 81 MG EC tablet Take 1 tablet (81 mg total) by mouth daily. Swallow whole. 06/21/20  Yes Charlynne Cousins, MD  clopidogrel (PLAVIX) 75 MG tablet Take 1 tablet (75 mg total) by mouth daily with breakfast. 06/21/20  Yes Charlynne Cousins, MD  collagenase (SANTYL) ointment Apply 1 application topically daily. Apply to the left heel daily  with a layer the thickness of a nickel 06/29/20  Yes McDonald, Stephan Minister, DPM  ferric citrate (AURYXIA) 1 GM 210 MG(Fe) tablet Take 210 mg by mouth 3 (three) times daily with meals.    Yes [provider]  gabapentin (NEURONTIN) 100 MG capsule Take 300 mg by mouth daily. 05/16/20  Yes [provider]  glipiZIDE (GLUCOTROL) 10 MG tablet Take 10 mg by mouth 2 (two) times daily. 04/07/20  Yes [provider]  lidocaine-prilocaine (EMLA) cream Apply 1 application topically See admin instructions. Apply topically to port  access one hour prior to dialysis on Sunday, Monday, Wednesday, Thursday 05/12/20  Yes [provider]  midodrine (PROAMATINE) 10 MG tablet Take 10-20 mg by mouth 4 (four) times a week. Take 1 tablet on dialysis days (Sunday, Monday, Wednesday, and Thursday.). May take an additional tablet after dialysis for blood pressure. 07/19/20  Yes [provider]  multivitamin (RENA-VIT) TABS tablet Take 1 tablet by mouth daily.   Yes [provider]  mupirocin ointment (BACTROBAN) 2 % Apply 1 application topically See admin instructions. Apply topically to port access after dialysis - Sunday, Monday, Wednesday, Thursday 05/17/20  Yes [provider]  omeprazole (PRILOSEC) 40 MG capsule Take 40 mg by mouth daily.   Yes [provider]  rosuvastatin (CRESTOR) 10 MG tablet Take 1 tablet (10 mg total) by mouth daily. 07/25/20  Yes Waynetta Sandy, MD  sennosides-docusate sodium (SENOKOT-S) 8.6-50 MG tablet Take 1 tablet by mouth daily as needed for constipation.   Yes [provider]  Methoxy PEG-Epoetin Beta (MIRCERA IJ) Inject into the skin. 07/03/20   [provider]    Allergies    Patient has no known allergies.  Review of Systems   Review of Systems  Constitutional: Positive for fatigue.  Skin: Positive for wound.  Psychiatric/Behavioral: Positive for confusion.  All other systems reviewed and are negative.   Physical Exam Updated Vital Signs BP (!) 131/66 (BP Location: Left Arm)   Pulse 93   Temp (!) 100.6 F (38.1 C) (Oral)   Resp 15   Ht 5\' 9"  (1.753 m)   Wt 70.3 kg   SpO2 99%   BMI 22.89 kg/m   Physical Exam Vitals and nursing note reviewed.  Constitutional:      General: He is not in acute distress.    Appearance: He is well-developed.  HENT:     Head: Normocephalic and atraumatic.     Right Ear: Hearing normal.     Left Ear: Hearing normal.     Nose: Nose normal.  Eyes:     Conjunctiva/sclera: Conjunctivae  normal.     Pupils: Pupils are equal, round, and reactive to light.  Cardiovascular:     Rate and Rhythm: Regular rhythm.     Heart sounds: S1 normal and S2 normal. No murmur heard.  No friction rub. No gallop.   Pulmonary:     Effort: Pulmonary effort is normal. No respiratory distress.     Breath sounds: Normal breath sounds.  Chest:     Chest wall: No tenderness.  Abdominal:     General: Bowel sounds are normal.     Palpations: Abdomen is soft.     Tenderness: There is no abdominal tenderness. There is no guarding or rebound. Negative signs include Murphy's sign and McBurney's sign.     Hernia: No hernia is present.  Musculoskeletal:        General: Normal range of motion.  Cervical back: Normal range of motion and neck supple.     Right lower leg: Edema present.     Left lower leg: Edema present.  Skin:    General: Skin is warm and dry.     Findings: Wound (left heal) present. No rash.  Neurological:     Mental Status: He is alert and oriented to person, place, and time.     GCS: GCS eye subscore is 4. GCS verbal subscore is 5. GCS motor subscore is 6.     Cranial Nerves: No cranial nerve deficit.     Sensory: No sensory deficit.     Coordination: Coordination normal.  Psychiatric:        Speech: Speech normal.        Behavior: Behavior normal.        Thought Content: Thought content normal.       ED Results / Procedures / Treatments   Labs (all labs ordered are listed, but only abnormal results are displayed) Labs Reviewed  COMPREHENSIVE METABOLIC PANEL - Abnormal; Notable for the following components:      Result Value   Sodium 132 (*)    Potassium 2.9 (*)    Chloride 91 (*)    Glucose, Bld 167 (*)    BUN 25 (*)    Creatinine, Ser 4.39 (*)    Total Protein 6.2 (*)    Albumin 3.0 (*)    AST 53 (*)    GFR calc non Af Amer 13 (*)    GFR calc Af Amer 16 (*)    All other components within normal limits  CBC WITH DIFFERENTIAL/PLATELET - Abnormal; Notable  for the following components:   WBC 12.0 (*)    RBC 3.19 (*)    Hemoglobin 9.8 (*)    HCT 29.5 (*)    Neutro Abs 9.9 (*)    All other components within normal limits  CULTURE, BLOOD (ROUTINE X 2)  CULTURE, BLOOD (ROUTINE X 2)  URINE CULTURE  LACTIC ACID, PLASMA  APTT  PROTIME-INR  LACTIC ACID, PLASMA  URINALYSIS, ROUTINE W REFLEX MICROSCOPIC  PROCALCITONIN    EKG EKG Interpretation  Date/Time:  Wednesday July 26 2020 23:49:16 EDT Ventricular Rate:  90 PR Interval:    QRS Duration: 93 QT Interval:  384 QTC Calculation: 470 R Axis:   56 Text Interpretation: Sinus rhythm Probable left atrial enlargement Anteroseptal infarct, old No significant change since last tracing Confirmed by Orpah Greek [id number]) on 07/27/2020 12:04:08 AM   Radiology DG Chest Port 1 View  Result Date: 07/26/2020 CLINICAL DATA:  [place] EXAM: PORTABLE CHEST 1 VIEW COMPARISON:  09/08/2019 FINDINGS: The heart size and mediastinal contours are within normal limits. Aortic atherosclerosis. Both lungs are clear. The visualized skeletal structures are unremarkable. IMPRESSION: No active disease. Electronically Signed   By: Donavan Foil M.D.   On: 07/26/2020 23:39    Procedures Procedures (including critical care time)  Medications Ordered in ED Medications  ceFEPIme (MAXIPIME) 2 g in sodium chloride 0.9 % 100 mL IVPB (2 g Intravenous New Bag/Given 07/27/20 0009)  metroNIDAZOLE (FLAGYL) IVPB 500 mg (500 mg Intravenous New Bag/Given 07/27/20 0013)  vancomycin (VANCOCIN) IVPB 1000 mg/200 mL premix (1,000 mg Intravenous New Bag/Given 07/27/20 0014)  lactated ringers bolus 500 mL (500 mLs Intravenous New Bag/Given 07/27/20 0010)    ED Course  I have reviewed the triage vital signs and the nursing notes.  Pertinent labs & imaging results that were available during my care of the  patient were reviewed by me and considered in my medical decision making (see chart for details).    MDM  Rules/Calculators/A&P                          Patient presents to the emergency department for evaluation of low blood pressure with altered mental status.  Patient found to be febrile at arrival.  Multiple SIRS criteria (fever, mild tachycardia, elevated white blood cell count ) present and therefore sepsis treatment was initiated at arrival.  He was reportedly hypotensive before arrival but is normotensive at arrival.  He was given some fluid during transport and additional fluid administered upon arrival.  Patient given broad-spectrum antibiotics.  He does do home dialysis so bacteremia is a possibility.  He does have what appears to be an infected chronic heel ulcer as well.  Reviewing images from outpatient visit to podiatry on July 15 show that the heel looks significantly worse tonight.   Blood cultures pending.  Patient will require admission for further management.  CRITICAL CARE Performed by: Orpah Greek   Total critical care time: 35 minutes  Critical care time was exclusive of separately billable procedures and treating other patients.  Critical care was necessary to treat or prevent imminent or life-threatening deterioration.  Critical care was time spent personally by me on the following activities: development of treatment plan with patient and/or surrogate as well as nursing, discussions with consultants, evaluation of patient's response to treatment, examination of patient, obtaining history from patient or surrogate, ordering and performing treatments and interventions, ordering and review of laboratory studies, ordering and review of radiographic studies, pulse oximetry and re-evaluation of patient's condition.  Final Clinical Impression(s) / ED Diagnoses Final diagnoses:  Sepsis without acute organ dysfunction, due to unspecified organism [facility])    Rx / DC Orders ED Discharge Orders    None       Cincere Deprey, Gwenyth Allegra, MD 07/27/20 0320

## 2020-07-27 NOTE — Consult Note (Signed)
[place] Nurse Consult Note: Patient receiving care in [facility] ED 35. Reason for Consult: left heel ulcer Wound type: unstageable, eschar to left posterior heel Pressure Injury POA: Yes Measurement: 7 cm x 6.5 cm Wound bed: 100% eschar Drainage (amount, consistency, odor) none expressed; eschar stable. Periwound: intact Dressing procedure/placement/frequency:   Twice daily application of betadine.  Use Prevalon boots bilaterally. Monitor the wound area(s) for worsening of condition such as: Signs/symptoms of infection,  Increase in size,  Development of or worsening of odor, Development of pain, or increased pain at the affected locations.  Notify the medical team if any of these develop.  Thank you for the consult. [place] nurse will not follow at this time.  Please re-consult the [place] team if needed.  [name] Riles, RN, MSN, CWOCN, CNS-BC, pager [phone]

## 2020-07-27 NOTE — Progress Notes (Signed)
Pharmacy Antibiotic Note  Duane Hall is a 63 y.o. male admitted on 07/26/2020 with diabetic foot ulcers, ?sepsis.  Pharmacy has been consulted for Vancomycin/Cefepime dosing. WBC mildly elevated. Pt does at-home HD 4 times weekly-not sure if he will continue 4 times weekly here or if he will switch to 3 times weekly while inpatient  Plan: Vancomycin 1500 mg IV x 1, then f/u how they will handle HD here  Cefepime 1g IV q24h Trend WBC, temp, HD schedule F/U infectious work-up Drug levels as indicated   Height: 5\' 9"  (175.3 cm) Weight: 70.3 kg (155 lb) IBW/kg (Calculated) : 70.7  Temp (24hrs), Avg:99.7 F (37.6 C), Min:98.7 F (37.1 C), Max:100.6 F (38.1 C)  Recent Labs  Lab 07/26/20 2345  WBC 12.0*  CREATININE 4.39*  LATICACIDVEN 1.1    Estimated Creatinine Clearance: 17.3 mL/min (A) (by C-G formula based on SCr of 4.39 mg/dL (H)).    No Known Allergies  Narda Bonds, PharmD, BCPS Clinical Pharmacist Phone: [phone]

## 2020-07-27 NOTE — ED Notes (Signed)
Lunch Tray Ordered @ 1008. 

## 2020-07-27 NOTE — Consult Note (Signed)
[facility] Renal Consultation Note    Indication for Consultation:  Management of ESRD/hemodialysis; anemia, hypertension/volume and secondary hyperparathyroidism  ALP:[id number], [name], PA  HPI: Duane Hall is a 64 y.o. male. ESRD 2/2 DM on home HD [facility].   Past medical history significant for vascular insufficiency of extremity, left foot ulcer, claudication, COPD, tobacco abuse, hypokalemia, hepatitis C, and anemia of chronic kidney disease. Of note, he is compliant with his home HD and says it usually runs 3 hours.     He presented to the ED last night (07/28) for generalized weakness, confusion, and low blood pressure following his home HD.  He says that he usually gets hypotensive following his dialysis treatment but reports that this time he felt very altered and confused. Patient is not able to provide a thorough history of what happened last night as he reports that he was in and out of consciousness. He reports that his wife told him that he had a fever of 102.74F before she called EMS out of concern for his mental status. En route to the hospital he received IV fluid resuscitation for his low blood pressures and Bps have been stable since that time. Cultures were sent and pending. Chest xray was negative for pathology.  The patient was recently admitted here with diabetic foot ulcer. He was seen by podiatry and it was debrided and started on antibiotics. Vascular surgery also placed stents in his legs. States that about two days ago his wife noticed that the left foot ulcer had started to produce an odor when changing his dressing. He says that the ulcer always produces discharge but was unable to describe the type of discharge.  Says he has been having chills for the last few days but denies having a fever before last night. Reports chronic intermittent nausea and vomiting.  Otherwise, denies change in N/V, diarrhea, abdominal pain, SOB, and chest pain.  Past Medical  History:  Diagnosis Date  . Asthma   . Cancer ([place])   . Cataract   . CKD (chronic kidney disease), stage III   . Diabetes mellitus   . Glaucoma   . Vascular insufficiency 05/2020   Past Surgical History:  Procedure Laterality Date  . ABDOMINAL AORTOGRAM W/LOWER EXTREMITY N/A 06/19/2020   Procedure: ABDOMINAL AORTOGRAM W/LOWER EXTREMITY;  Surgeon: Waynetta Sandy, MD;  Location: [facility];  Service: Cardiovascular;  Laterality: N/A;  . AV FISTULA PLACEMENT Right 09/14/2019   Procedure: Right Arm Basilic Vein transposition;  Surgeon: Angelia Mould, MD;  Location: [facility];  Service: Vascular;  Laterality: Right;  . COLONOSCOPY    . PERIPHERAL VASCULAR ATHERECTOMY Left 06/19/2020   Procedure: PERIPHERAL VASCULAR ATHERECTOMY;  Surgeon: Waynetta Sandy, MD;  Location: [facility];  Service: Cardiovascular;  Laterality: Left;  SFA  . UPPER GASTROINTESTINAL ENDOSCOPY  02/2019   Dr Benson Norway     Family History  Problem Relation Age of Onset  . Cancer Father    Social History:  reports that he has been smoking cigarettes. He has been smoking about 0.80 packs per day. He has never used smokeless tobacco. He reports that he does not drink alcohol and does not use drugs. No Known Allergies Prior to Admission medications   Medication Sig Start Date End Date Taking? Authorizing Provider  aspirin EC 81 MG EC tablet Take 1 tablet (81 mg total) by mouth daily. Swallow whole. 06/21/20  Yes Charlynne Cousins, MD  clopidogrel (PLAVIX) 75 MG  tablet Take 1 tablet (75 mg total) by mouth daily with breakfast. 06/21/20  Yes Charlynne Cousins, MD  collagenase (SANTYL) ointment Apply 1 application topically daily. Apply to the left heel daily with a layer the thickness of a nickel 06/29/20  Yes McDonald, Stephan Minister, DPM  ferric citrate (AURYXIA) 1 GM 210 MG(Fe) tablet Take 210 mg by mouth 3 (three) times daily with meals.    Yes [provider]  gabapentin (NEURONTIN)  100 MG capsule Take 300 mg by mouth daily. 05/16/20  Yes [provider]  glipiZIDE (GLUCOTROL) 10 MG tablet Take 10 mg by mouth 2 (two) times daily. 04/07/20  Yes [provider]  lidocaine-prilocaine (EMLA) cream Apply 1 application topically See admin instructions. Apply topically to port access one hour prior to dialysis on Sunday, Monday, Wednesday, Thursday 05/12/20  Yes [provider]  midodrine (PROAMATINE) 10 MG tablet Take 10-20 mg by mouth 4 (four) times a week. Take 1 tablet on dialysis days (Sunday, Monday, Wednesday, and Thursday.). May take an additional tablet after dialysis for blood pressure. 07/19/20  Yes [provider]  multivitamin (RENA-VIT) TABS tablet Take 1 tablet by mouth daily.   Yes [provider]  mupirocin ointment (BACTROBAN) 2 % Apply 1 application topically See admin instructions. Apply topically to port access after dialysis - Sunday, Monday, Wednesday, Thursday 05/17/20  Yes [provider]  omeprazole (PRILOSEC) 40 MG capsule Take 40 mg by mouth daily.   Yes [provider]  rosuvastatin (CRESTOR) 10 MG tablet Take 1 tablet (10 mg total) by mouth daily. 07/25/20  Yes Waynetta Sandy, MD  sennosides-docusate sodium (SENOKOT-S) 8.6-50 MG tablet Take 1 tablet by mouth daily as needed for constipation.   Yes [provider]  Methoxy PEG-Epoetin Beta (MIRCERA IJ) Inject into the skin. 07/03/20   [provider]   Current Facility-Administered Medications  Medication Dose Route Frequency Provider Last Rate Last Admin  . acetaminophen (TYLENOL) tablet 650 mg  650 mg Oral Q6H PRN Crosley, Debby, MD       Or  . acetaminophen (TYLENOL) suppository 650 mg  650 mg Rectal Q6H PRN Crosley, Debby, MD      . aspirin EC tablet 81 mg  81 mg Oral Daily Crosley, Debby, MD   81 mg at 07/27/20 0914  . ceFEPIme (MAXIPIME) 1 g in sodium chloride 0.9 % 100 mL IVPB  1 g Intravenous Q24H Erenest Blank,  RPH      . clopidogrel (PLAVIX) tablet 75 mg  75 mg Oral Q breakfast Quintella Baton, MD   75 mg at 07/27/20 0806  . gabapentin (NEURONTIN) capsule 300 mg  300 mg Oral Daily Crosley, Debby, MD   300 mg at 07/27/20 0914  . heparin injection 5,000 Units  5,000 Units Subcutaneous Q8H Quintella Baton, MD   5,000 Units at 07/27/20 0656  . insulin aspart (novoLOG) injection 0-15 Units  0-15 Units Subcutaneous TID WC Quintella Baton, MD   3 Units at 07/27/20 0805  . insulin aspart (novoLOG) injection 0-5 Units  0-5 Units Subcutaneous QHS Crosley, Debby, MD      . midodrine (PROAMATINE) tablet 10 mg  10 mg Oral Once per day on Sun Tue Thu Sat Quintella Baton, MD      . multivitamin (RENA-VIT) tablet 1 tablet  1 tablet Oral QHS Crosley, Debby, MD      . nicotine (NICODERM CQ - dosed in mg/24 hours) patch 14 mg  14 mg Transdermal Daily Crosley,  Debby, MD   14 mg at 07/27/20 0914  . pantoprazole (PROTONIX) EC tablet 40 mg  40 mg Oral Daily Crosley, Debby, MD   40 mg at 07/27/20 0915  . polyethylene glycol (MIRALAX / GLYCOLAX) packet 17 g  17 g Oral Daily PRN Crosley, Debby, MD      . rosuvastatin (CRESTOR) tablet 10 mg  10 mg Oral Daily Crosley, Debby, MD   10 mg at 07/27/20 0915   Current Outpatient Medications  Medication Sig Dispense Refill  . aspirin EC 81 MG EC tablet Take 1 tablet (81 mg total) by mouth daily. Swallow whole. 30 tablet 11  . clopidogrel (PLAVIX) 75 MG tablet Take 1 tablet (75 mg total) by mouth daily with breakfast. 30 tablet 3  . collagenase (SANTYL) ointment Apply 1 application topically daily. Apply to the left heel daily with a layer the thickness of a nickel 30 g 2  . ferric citrate (AURYXIA) 1 GM 210 MG(Fe) tablet Take 210 mg by mouth 3 (three) times daily with meals.     . gabapentin (NEURONTIN) 100 MG capsule Take 300 mg by mouth daily.    Marland Kitchen glipiZIDE (GLUCOTROL) 10 MG tablet Take 10 mg by mouth 2 (two) times daily.    Marland Kitchen lidocaine-prilocaine (EMLA) cream Apply 1 application  topically See admin instructions. Apply topically to port access one hour prior to dialysis on Sunday, Monday, Wednesday, Thursday    . midodrine (PROAMATINE) 10 MG tablet Take 10-20 mg by mouth 4 (four) times a week. Take 1 tablet on dialysis days (Sunday, Monday, Wednesday, and Thursday.). May take an additional tablet after dialysis for blood pressure.    . multivitamin (RENA-VIT) TABS tablet Take 1 tablet by mouth daily.    . mupirocin ointment (BACTROBAN) 2 % Apply 1 application topically See admin instructions. Apply topically to port access after dialysis - Sunday, Monday, Wednesday, Thursday    . omeprazole (PRILOSEC) 40 MG capsule Take 40 mg by mouth daily.    . rosuvastatin (CRESTOR) 10 MG tablet Take 1 tablet (10 mg total) by mouth daily. 30 tablet 0  . sennosides-docusate sodium (SENOKOT-S) 8.6-50 MG tablet Take 1 tablet by mouth daily as needed for constipation.    . Methoxy PEG-Epoetin Beta (MIRCERA IJ) Inject into the skin.     Labs: Basic Metabolic Panel: Recent Labs  Lab 07/26/20 2345 07/27/20 0639  NA 132* 131*  K 2.9* 3.0*  CL 91* 92*  CO2 29 28  GLUCOSE 167* 151*  BUN 25* 30*  CREATININE 4.39* 4.67*  CALCIUM 8.9 8.4*   Liver Function Tests: Recent Labs  Lab 07/26/20 2345  AST 53*  ALT 24  ALKPHOS 121  BILITOT 0.7  PROT 6.2*  ALBUMIN 3.0*   CBC: Recent Labs  Lab 07/26/20 2345 07/27/20 0639  WBC 12.0* 10.0  NEUTROABS 9.9*  --   HGB 9.8* 9.9*  HCT 29.5* 29.1*  MCV 92.5 93.6  PLT 158 142*   CBG: Recent Labs  Lab 07/27/20 0751  GLUCAP 153*   Studies/Results: DG Chest Port 1 View  Result Date: 07/26/2020 CLINICAL DATA:  Fever EXAM: PORTABLE CHEST 1 VIEW COMPARISON:  09/08/2019 FINDINGS: The heart size and mediastinal contours are within normal limits. Aortic atherosclerosis. Both lungs are clear. The visualized skeletal structures are unremarkable. IMPRESSION: No active disease. Electronically Signed   By: Donavan Foil M.D.   On: 07/26/2020 23:39    DG Foot 2 Views Left  Result Date: 07/27/2020 CLINICAL DATA:  Foot infection, open  wound EXAM: LEFT FOOT - 2 VIEW COMPARISON:  06/18/2020 FINDINGS: Soft tissue wound is noted over the heel. No definitive bony erosive changes to suggest osteomyelitis are noted. No acute fracture or dislocation is seen. IMPRESSION: Soft tissue wound without acute findings of osteomyelitis. Electronically Signed   By: Inez Catalina M.D.   On: 07/27/2020 03:15    ROS: All others negative except those listed in HPI.  General: Positive for fever and chills. Negative for weight loss. Neurologic: Positive for recent episode of syncope and AMS. No recent episodes of slurred speech or temporary blindness.  Cardiac: No recent episodes of chest pain/pressure,  shortness of breath at rest or DOE.  Vascular: Positive for nonhealing ulcers with history of claudication.  Pulmonary: No home oxygen,no cough, hemoptysis, or wheezing  Gastrointestinal: No N/V or diarrhea  Physical Exam: Vitals:   07/27/20 0800 07/27/20 0826 07/27/20 0827 07/27/20 0828  BP: (!) 140/63     Pulse: 82 83 85 89  Resp: (!) 11 19 16 17   Temp:      TempSrc:      SpO2: 99% 99% 98% 96%  Weight:      Height:         General: WDWN male in NAD Head: NCAT sclera not icteric MMM Lungs: CTA bilaterally. No wheeze, rales or rhonchi. Breathing is unlabored. Heart: RRR. No murmur, rubs or gallops.  Abdomen: soft, nontender, +BS, no guarding, no rebound tenderness M/S:  Equal strength b/l in upper and lower extremities.  Lower extremities: +1 edema in right lower leg, trace edema in left lower leg. Large diabetic foot ulcer, with eschar on left heel.  Neuro: AAOx3. Moves all extremities spontaneously. Psych:  Responds to questions appropriately with a normal affect. Dialysis Access: RU AV fistula, +bruit  Dialysis Orders:  [facility] - NxStage  3hrs, BFR 400, DFR 800, EDW 70kg , 2K  Access: R. Upper AV fistula  Heparin 3000 Midodrine 10mg   qHD   Assessment/Plan: 1. Sepsis likely d/t diabetic foot ulcer and vascular insufficiency of extremity - Cultures sent and pending. Wound care consulted this morning.  IV antibiotics per primary.  2. ESRD -  Will plan on MWF dialysis schedule during admission since he received home HD yesterday 7/28. Due for next HD treatment tomorrow 7/30, will make 3hrs since he has already completed 3 of his 4 treatments this week. K is 3.0 so will get 4K bath at dialysis. 3. Hypotension/volume  - Has been normotensive since arrival to the ED. On midodrine with HD for BP support. If weights correct he is at his dry. +edema. Will plan for UF 1-2L as tolerated. Will continue to monitor blood pressures.   4. Anemia of CKD - Hgb 9.9.  No recent ESA dose noted in chart.  Will give aranesp 60mcg qwk starting tomorrow. 5. Secondary Hyperparathyroidism -  Corrected calcium of 9.1.  Continue Auryxia. Follow RFP tomorrow.  6. Nutrition - Albumin 3.0. Continue renal/carb modified diet w/ fluid restirction. Start protein supplement.  7. Tobacco abuse - Started on nicotine patch per primary. 8. COPD - stable, home meds resumed per primary.  9. Diabetes mellitus - sliding scale insulin ordered, monitored by primary.   Gustavus Messing, NP Student [employer]   Progress note written with assistance by NP student.  Provider performed own history and physical exam.  Plan discussed with student and documented.  Jen Mow, PA-C [facility] 07/27/2020, 9:21 AM

## 2020-07-27 NOTE — Telephone Encounter (Signed)
Pt's wife, Belenda Cruise pt is in the hospital and she has some questions.

## 2020-07-28 DIAGNOSIS — E113553 Type 2 diabetes mellitus with stable proliferative diabetic retinopathy, bilateral: Secondary | ICD-10-CM | POA: Insufficient documentation

## 2020-07-28 DIAGNOSIS — Z961 Presence of intraocular lens: Secondary | ICD-10-CM | POA: Insufficient documentation

## 2020-07-28 LAB — CBC WITH DIFFERENTIAL/PLATELET
Abs Immature Granulocytes: 0.02 10*3/uL (ref 0.00–0.07)
Basophils Absolute: 0 10*3/uL (ref 0.0–0.1)
Basophils Relative: 1 %
Eosinophils Absolute: 0.2 10*3/uL (ref 0.0–0.5)
Eosinophils Relative: 3 %
HCT: 29.1 % — ABNORMAL LOW (ref 39.0–52.0)
Hemoglobin: 9.9 g/dL — ABNORMAL LOW (ref 13.0–17.0)
Immature Granulocytes: 0 %
Lymphocytes Relative: 17 %
Lymphs Abs: 1.1 10*3/uL (ref 0.7–4.0)
MCH: 31.1 pg (ref 26.0–34.0)
MCHC: 34 g/dL (ref 30.0–36.0)
MCV: 91.5 fL (ref 80.0–100.0)
Monocytes Absolute: 0.7 10*3/uL (ref 0.1–1.0)
Monocytes Relative: 10 %
Neutro Abs: 4.6 10*3/uL (ref 1.7–7.7)
Neutrophils Relative %: 69 %
Platelets: 150 10*3/uL (ref 150–400)
RBC: 3.18 MIL/uL — ABNORMAL LOW (ref 4.22–5.81)
RDW: 13.1 % (ref 11.5–15.5)
WBC: 6.6 10*3/uL (ref 4.0–10.5)
nRBC: 0 % (ref 0.0–0.2)

## 2020-07-28 LAB — GLUCOSE, CAPILLARY
Glucose-Capillary: 272 mg/dL — ABNORMAL HIGH (ref 70–99)
Glucose-Capillary: 104 mg/dL — ABNORMAL HIGH (ref 70–99)
Glucose-Capillary: 153 mg/dL — ABNORMAL HIGH (ref 70–99)
Glucose-Capillary: 222 mg/dL — ABNORMAL HIGH (ref 70–99)

## 2020-07-28 LAB — MRSA PCR SCREENING: MRSA by PCR: NEGATIVE

## 2020-07-28 MED ORDER — CHLORHEXIDINE GLUCONATE CLOTH 2 % EX PADS
6.0000 | MEDICATED_PAD | Freq: Once | CUTANEOUS | Status: AC
  Administered 2020-07-29: 6 via TOPICAL

## 2020-07-28 MED ORDER — CHLORHEXIDINE GLUCONATE CLOTH 2 % EX PADS: 6.0000 | MEDICATED_PAD | Freq: Once | CUTANEOUS | Status: AC

## 2020-07-28 MED ORDER — ONDANSETRON HCL 4 MG/2ML IJ SOLN
4.0000 mg | Freq: Four times a day (QID) | INTRAMUSCULAR | Status: DC | PRN
  Administered 2020-07-28: 4 mg via INTRAVENOUS
  Filled 2020-07-28: qty 2

## 2020-07-28 MED ORDER — DARBEPOETIN ALFA 60 MCG/0.3ML IJ SOSY
60.0000 ug | PREFILLED_SYRINGE | INTRAMUSCULAR | Status: DC
  Administered 2020-07-29: 60 ug via INTRAVENOUS

## 2020-07-28 MED ORDER — SODIUM CHLORIDE 0.9 % IV SOLN
2.0000 g | INTRAVENOUS | Status: DC
  Administered 2020-07-29: 2 g via INTRAVENOUS

## 2020-07-28 MED ORDER — ALUM & MAG HYDROXIDE-SIMETH 200-200-20 MG/5ML PO SUSP
15.0000 mL | ORAL | Status: DC | PRN
  Administered 2020-07-28: 15 mL via ORAL
  Filled 2020-07-28: qty 30

## 2020-07-28 NOTE — Progress Notes (Signed)
PROGRESS NOTE    Duane Hall  CXK:[id number] DOB: 09-Jul-1956 DOA: 07/26/2020 PCP: Pieter Partridge, PA    Brief Narrative:  Patient admitted to the hospital with a working diagnosis of sepsis due to diabetic foot ulcer, end-organ failure hypotension.   64 year old male with significant past medical history for end-stage renal disease on hemodialysis who presents with confusion and low blood pressure.  He was brought to the hospital due to worsening hypotension after hemodialysis. Reported worsening edema, erythema and odor at the wound site for several days.  He had a recent admission for diabetic foot ulcer, related to peripheral vascular disease, s/p endarterectomy and balloon angioplasty to the left superior femoral artery.  After administration of intravenous fluids blood pressure 131/58, pulse rate 82, respirate 17, temperature 98.7, oxygenation 99%.  His lungs were clear to auscultation bilaterally, heart S1-S2, present rhythmic, soft abdomen, no lower extremity edema. Left foot with loca edema and large heal ulcer with necrotic scar, positive erythema and local tenderness along with increased local temperature.  Sodium 132, potassium 2.9, chloride 91, bicarb 29, glucose 167, BUN 25, creatinine 4.39, lactic acid 1.1, white count 12.0, hemoglobin 9.8, hematocrit 29.5, platelets 158.  SARS COVID-19 negative.  Urinalysis 11-20 white cells, 11-20 red cells, specific gravity 1.020, > 300 protein.  Chest radiograph with hyperinflation, bibasilar atelectasis.  EKG 90 bpm, normal axis, normal intervals, sinus rhythm, Q wave V1-V2, no ST segment or T wave changes, positive LVH.  Further work up with left foot MRI, positive for calcaneus bone osteomyelitis.    Assessment & Plan:   Principal Problem:   Sepsis ([place]) Active Problems:   Type II diabetes mellitus ([place])   Hypertension   Hepatitis C   Hypokalemia   Vascular insufficiency of extremity   Diabetic foot ulcer ([place])   ESRD (end stage  renal disease) ([place])   1. Left foot calcaneus osteomyelitis in the setting of peripheral vascular disease, complicated with sepsis, end-organ failure hypotension (present on admission). Foot films with no bony lesions. Foot MRI with superficial ulceration overlying the posterior calcaneus, with diffuse subcutaneous inflammation, suggestive for osteomyelitis. Blood pressure this am 117/58 mmHg.   Continue antibiotic therapy with IV vancomycin and cefepime, follow up with podiatry recommendations.   Continue with asa, clopidogrel and statin therapy.   2. ESRD on HD/ with hypokalemia/ hypomagnesemia/ hyponatremia. Patient with normal volume status, plan for hemodialysis today. Follow renal panel and nephrology recommendations.    3. T2DM/ dyslipidemia. Fasting glucose this am 272, on insulin sliding scale for glucose cover and monitoring.  On rosuvastatin   4. HTN. Continue with midodrine for hypotension. His EKG has signs of LVH,   Follow up on echocardiogram.    5. Anemia of chronic renal disease. Continue with darbepoetin. Stable hgb at 9,9 and Hct at 29.1   P  Status is: Inpatient  Remains inpatient appropriate because:IV treatments appropriate due to intensity of illness or inability to take PO   Dispo: The patient is from: Home              Anticipated d/c is to: Home              Anticipated d/c date is: 3 days              Patient currently is not medically stable to d/c.   DVT prophylaxis: heparin    Code Status:    full Family Communication:  No family at the bedside      Consultants:  Podiatry      Antimicrobials:   Vancomycin and cefepime     Subjective: Patient with no chest pain or dyspnea, continue to be very fatigued and not back to baseline,   Objective: Vitals:   07/27/20 2230 07/28/20 0015 07/28/20 0430 07/28/20 0800  BP:  (!) 115/49 99/75 (!) 117/58  Pulse:  87 89 77  Resp: 14 13 20  (!) 11  Temp:  98.4 F (36.9 C) 98.6 F (37 C)  98.4 F (36.9 C)  TempSrc:  Oral Oral Oral  SpO2: 97% 100% 97% 97%  Weight:   72.2 kg   Height:   5\' 9"  (1.753 m)     Intake/Output Summary (Last 24 hours) at 07/28/2020 1526 Last data filed at 07/28/2020 0600 Gross per 24 hour  Intake 580.17 ml  Output --  Net 580.17 ml   Filed Weights   07/26/20 2318 07/28/20 0430  Weight: 70.3 kg 72.2 kg    Examination:   General: deconditioned and ill looking appearing  Neurology: Awake and alert, non focal  E ENT: positive pallor  Cardiovascular: No JVD. S1-S2 present, rhythmic, no gallops, rubs, or murmurs. No lower extremity edema. Pulmonary: vesicular breath sounds bilaterally, adequate air movement, no wheezing, rhonchi or rales. Gastrointestinal. Abdomen soft and non tender Skin. Left foot with large heal ulcer, dressing in place.  Musculoskeletal: no joint deformities     Data Reviewed: I have personally reviewed following labs and imaging studies  CBC: Recent Labs  Lab 07/26/20 2345 07/27/20 0639  WBC 12.0* 10.0  NEUTROABS 9.9*  --   HGB 9.8* 9.9*  HCT 29.5* 29.1*  MCV 92.5 93.6  PLT 158 701*   Basic Metabolic Panel: Recent Labs  Lab 07/26/20 2345 07/27/20 0639  NA 132* 131*  K 2.9* 3.0*  CL 91* 92*  CO2 29 28  GLUCOSE 167* 151*  BUN 25* 30*  CREATININE 4.39* 4.67*  CALCIUM 8.9 8.4*  MG  --  1.5*   GFR: Estimated Creatinine Clearance: 16.4 mL/min (A) (by C-G formula based on SCr of 4.67 mg/dL (H)). Liver Function Tests: Recent Labs  Lab 07/26/20 2345  AST 53*  ALT 24  ALKPHOS 121  BILITOT 0.7  PROT 6.2*  ALBUMIN 3.0*   No results for input(s): LIPASE, AMYLASE in the last 168 hours. No results for input(s): AMMONIA in the last 168 hours. Coagulation Profile: Recent Labs  Lab 07/26/20 2345  INR 1.2   Cardiac Enzymes: No results for input(s): CKTOTAL, CKMB, CKMBINDEX, TROPONINI in the last 168 hours. BNP (last 3 results) No results for input(s): PROBNP in the last 8760  hours. HbA1C: Recent Labs    07/27/20 0639  HGBA1C 7.2*   CBG: Recent Labs  Lab 07/27/20 1214 07/27/20 1655 07/27/20 2107 07/28/20 0803 07/28/20 1158  GLUCAP 205* 156* 168* 272* 104*   Lipid Profile: No results for input(s): CHOL, HDL, LDLCALC, TRIG, CHOLHDL, LDLDIRECT in the last 72 hours. Thyroid Function Tests: No results for input(s): TSH, T4TOTAL, FREET4, T3FREE, THYROIDAB in the last 72 hours. Anemia Panel: No results for input(s): VITAMINB12, FOLATE, FERRITIN, TIBC, IRON, RETICCTPCT in the last 72 hours.    Radiology Studies: I have reviewed all of the imaging during this hospital visit personally     Scheduled Meds: . aspirin EC  81 mg Oral Daily  . Chlorhexidine Gluconate Cloth  6 each Topical Q0600  . clopidogrel  75 mg Oral Q breakfast  . darbepoetin (ARANESP) injection - DIALYSIS  60 mcg Intravenous Q Fri-HD  .  gabapentin  300 mg Oral Daily  . heparin  5,000 Units Subcutaneous Q8H  . insulin aspart  0-15 Units Subcutaneous TID WC  . insulin aspart  0-5 Units Subcutaneous QHS  . magnesium oxide  400 mg Oral TID  . midodrine  10 mg Oral Once per day on Sun Tue Thu Sat  . multivitamin  1 tablet Oral QHS  . nicotine  14 mg Transdermal Daily  . pantoprazole  40 mg Oral Daily  . rosuvastatin  10 mg Oral Daily   Continuous Infusions: . ceFEPime (MAXIPIME) IV    . vancomycin       LOS: 1 day        Shray Hunley Gerome Apley, MD

## 2020-07-28 NOTE — Progress Notes (Signed)
[facility] Progress Note   Dialysis Orders: [facility] - [facility]  3hrs, BFR 400, DFR 800, EDW 70kg , 2K  Access: R. Upper AV fistula  Heparin 3000 Midodrine 10mg  qHD   Assessment/Plan: 1. Sepsis likely d/t diabetic foot ulcer and vascular insufficiency of extremity - Cultures sent and pending. Wound care followingg.  IV Vanc and Maxepime per pharm. MRI without contrast - soft tissue wound without osteo-  2. ESRD -  Will plan on MWF dialysis schedule during admission . plan 3hrs since he has already completed 3 of his 4 treatments this week. K is 3.0 so will get 4K bath at dialysis.- 40 KCL given yesterday - repeat labs today- if K remains low can liberalize diet 3. Hypotension/volume  - On midodrine with HD for BP support. If weights correct he is at his dry. +edema. Will plan for UF 1-2L as tolerated. Will continue to monitor blood pressures.  4. Anemia of CKD - Hgb 9.9.  No recent ESA dose noted in chart.  Start Araneso 60 today. 5. Secondary Hyperparathyroidism -  Corrected calcium of 9.1.  Continue Auryxia. Follow RFP tomorrow.  6. Nutrition - Albumin 3.0. Continue renal/carb modified diet w/ fluid restirction. Start protein supplement.  7. Tobacco abuse - Started on nicotine patch per primary. 8. COPD - stable, home meds resumed per primary.  9. Diabetes mellitus - sliding scale insulin ordered, monitored by primary.    Myriam Jacobson, PA-C [facility] Beeper [phone] 07/28/2020,8:11 AM  LOS: 1 day   Subjective:   Wants omeprazole - takes daily for GI issues. Told patient I would order then later saw he has protonix on order as [facility] alternative - should suffice. Explained to pt and RN. Understands 3 hr of HD today if "he needs at all"  Objective Vitals:   07/27/20 2000 07/27/20 2230 07/28/20 0015 07/28/20 0430  BP: (!) 140/56  (!) 115/49 99/75  Pulse: 86  87 89  Resp: 16 14 13 20   Temp:   98.4 F (36.9 C) 98.6 F (37 C)  TempSrc:   Oral  Oral  SpO2: 99% 97% 100% 97%  Weight:    72.2 kg  Height:    5\' 9"  (1.753 m)   Physical Exam General:chronically ill appearing male; irritable but NAD sitting on side of bed Heart:  RRR Lungs:no rales Abdomen: soft Extremities: L > R edema; dressings on bilateral heels Dialysis Access: right upper AVF + bruit   Additional Objective Labs: Basic Metabolic Panel: Recent Labs  Lab 07/26/20 2345 07/27/20 0639  NA 132* 131*  K 2.9* 3.0*  CL 91* 92*  CO2 29 28  GLUCOSE 167* 151*  BUN 25* 30*  CREATININE 4.39* 4.67*  CALCIUM 8.9 8.4*   Liver Function Tests: Recent Labs  Lab 07/26/20 2345  AST 53*  ALT 24  ALKPHOS 121  BILITOT 0.7  PROT 6.2*  ALBUMIN 3.0*   No results for input(s): LIPASE, AMYLASE in the last 168 hours. CBC: Recent Labs  Lab 07/26/20 2345 07/27/20 0639  WBC 12.0* 10.0  NEUTROABS 9.9*  --   HGB 9.8* 9.9*  HCT 29.5* 29.1*  MCV 92.5 93.6  PLT 158 142*   Blood Culture    Component Value Date/Time   SDES BLOOD LEFT ANTECUBITAL 06/18/2020 1822   SPECREQUEST  06/18/2020 1822    BOTTLES DRAWN AEROBIC AND ANAEROBIC Blood Culture results may not be optimal due to an inadequate volume of blood received in culture bottles   CULT  06/18/2020 1822    NO GROWTH 5 DAYS Performed at [facility], [place] [address] [place], [place] [address]    REPTSTATUS 06/23/2020 FINAL 06/18/2020 1822    Cardiac Enzymes: No results for input(s): CKTOTAL, CKMB, CKMBINDEX, TROPONINI in the last 168 hours. CBG: Recent Labs  Lab 07/27/20 0751 07/27/20 1214 07/27/20 1655 07/27/20 2107 07/28/20 0803  GLUCAP 153* 205* 156* 168* 272*   Iron Studies: No results for input(s): IRON, TIBC, TRANSFERRIN, FERRITIN in the last 72 hours. Lab Results  Component Value Date   INR 1.2 07/26/2020   INR 1.1 09/14/2019   INR 1.1 09/10/2019   Studies/Results: MR FOOT LEFT WO CONTRAST  Result Date: 07/27/2020 CLINICAL DATA:  Question of osteomyelitis with area of ulceration  EXAM: MRI OF THE LEFT FOOT WITHOUT CONTRAST TECHNIQUE: Multiplanar, multisequence MR imaging of the left hindfoot was performed. No intravenous contrast was administered. COMPARISON:  None. FINDINGS: Bones/Joint/Cartilage There is T2 hyperintense signal with subtle T1 hypointensity involving the posterior calcaneus. There is subtle area of cortical irregularity seen at the posterior calcaneus. No periosteal reaction. No osseous fracture seen. Normal osseous marrow signal seen throughout. No large joint effusion. There is trace subtalar and ankle joint effusion. Ligaments Visualized portions are intact. Muscles and Tendons Mildly increased signal is seen throughout the ankle and foot with diffuse mild fatty atrophy. The visualized portions of the flexor and extensor tendons are intact. The Achilles tendon is intact. Minimally increased signal seen at the insertion site of the Achilles tendon. The plantar fascia appears to be intact. Soft tissues Area of superficial ulceration overlying the posterior calcaneus with diffuse subcutaneous in heel pad inflammation. There is diffuse skin thickening. No loculated fluid collections or sinus tract is seen. IMPRESSION: Area of superficial ulceration of the posterior calcaneus with diffuse heel pad inflammation. No sinus tract or abscess. Findings suggestive of acute osteomyelitis involving the posterior calcaneus. No osseous fracture. Mild Achilles tendinosis. Diffuse muscular edema/denervation atrophy. Electronically Signed   By: Prudencio Pair M.D.   On: 07/27/2020 19:22   DG Chest Port 1 View  Result Date: 07/26/2020 CLINICAL DATA:  Fever EXAM: PORTABLE CHEST 1 VIEW COMPARISON:  09/08/2019 FINDINGS: The heart size and mediastinal contours are within normal limits. Aortic atherosclerosis. Both lungs are clear. The visualized skeletal structures are unremarkable. IMPRESSION: No active disease. Electronically Signed   By: Donavan Foil M.D.   On: 07/26/2020 23:39   DG Foot  2 Views Left  Result Date: 07/27/2020 CLINICAL DATA:  Foot infection, open wound EXAM: LEFT FOOT - 2 VIEW COMPARISON:  06/18/2020 FINDINGS: Soft tissue wound is noted over the heel. No definitive bony erosive changes to suggest osteomyelitis are noted. No acute fracture or dislocation is seen. IMPRESSION: Soft tissue wound without acute findings of osteomyelitis. Electronically Signed   By: Inez Catalina M.D.   On: 07/27/2020 03:15   Medications: . ceFEPime (MAXIPIME) IV Stopped (07/27/20 2244)  . vancomycin     . aspirin EC  81 mg Oral Daily  . Chlorhexidine Gluconate Cloth  6 each Topical Q0600  . clopidogrel  75 mg Oral Q breakfast  . gabapentin  300 mg Oral Daily  . heparin  5,000 Units Subcutaneous Q8H  . insulin aspart  0-15 Units Subcutaneous TID WC  . insulin aspart  0-5 Units Subcutaneous QHS  . magnesium oxide  400 mg Oral TID  . midodrine  10 mg Oral Once per day on Sun Tue Thu Sat  . multivitamin  1  tablet Oral QHS  . nicotine  14 mg Transdermal Daily  . pantoprazole  40 mg Oral Daily  . rosuvastatin  10 mg Oral Daily

## 2020-07-28 NOTE — Consult Note (Addendum)
Reason for Consult:Bilateral heel ulcerations, sepsis, concern for osteomyelitis Referring Physician: Tawni Millers, MD   Duane Hall is an 64 y.o. male.  HPI: He is well-known to me from the outpatient setting.  He was admitted to [facility] in June of this year after developing heel ulcerations.  He underwent vascular intervention on the left side with Dr. [name] which improved his pain and symptoms.  The wounds have been slowly progressing and healing with Santyl.  On the date of admission, 07/26/2020 resume his usual peritoneal dialysis session when his wife felt that he was lethargic and hypotensive.  They called 911 and he was admitted to the hospital.  He states that he does not particularly remember all the events that happened but just felt really tired and groggy.  States he has felt well since he left the ER.  Some pain in the left heel.  His wound dressings have been changed.  Past Medical History:  Diagnosis Date  . Asthma   . Cancer ([place])   . Cataract   . CKD (chronic kidney disease), stage III   . Diabetes mellitus   . Glaucoma   . Vascular insufficiency 05/2020    Past Surgical History:  Procedure Laterality Date  . ABDOMINAL AORTOGRAM W/LOWER EXTREMITY N/A 06/19/2020   Procedure: ABDOMINAL AORTOGRAM W/LOWER EXTREMITY;  Surgeon: Waynetta Sandy, MD;  Location: [place] [facility];  Service: Cardiovascular;  Laterality: N/A;  . AV FISTULA PLACEMENT Right 09/14/2019   Procedure: Right Arm Basilic Vein transposition;  Surgeon: Angelia Mould, MD;  Location: [facility];  Service: Vascular;  Laterality: Right;  . COLONOSCOPY    . PERIPHERAL VASCULAR ATHERECTOMY Left 06/19/2020   Procedure: PERIPHERAL VASCULAR ATHERECTOMY;  Surgeon: Waynetta Sandy, MD;  Location: [facility];  Service: Cardiovascular;  Laterality: Left;  SFA  . UPPER GASTROINTESTINAL ENDOSCOPY  02/2019   Dr Benson Norway      Family History  Problem Relation Age  of Onset  . Cancer Father     Social History:  reports that he has been smoking cigarettes. He has been smoking about 0.80 packs per day. He has never used smokeless tobacco. He reports that he does not drink alcohol and does not use drugs.  Allergies: No Known Allergies  Medications: I have reviewed the patient's current medications.  Results for orders placed or performed during the hospital encounter of 07/26/20 (from the past 48 hour(s))  Lactic acid, plasma     Status: None   Collection Time: 07/26/20 11:45 PM  Result Value Ref Range   Lactic Acid, Venous 1.1 0.5 - 1.9 mmol/L    Comment: Performed at [facility], [place] [address] [place], [place] [address]  Comprehensive metabolic panel     Status: Abnormal   Collection Time: 07/26/20 11:45 PM  Result Value Ref Range   Sodium 132 (L) 135 - 145 mmol/L   Potassium 2.9 (L) 3.5 - 5.1 mmol/L   Chloride 91 (L) 98 - 111 mmol/L   CO2 29 22 - 32 mmol/L   Glucose, Bld 167 (H) 70 - 99 mg/dL    Comment: Glucose reference range applies only to samples taken after fasting for at least 8 hours.   BUN 25 (H) 8 - 23 mg/dL   Creatinine, Ser 4.39 (H) 0.61 - 1.24 mg/dL   Calcium 8.9 8.9 - 10.3 mg/dL   Total Protein 6.2 (L) 6.5 - 8.1 g/dL   Albumin 3.0 (L) 3.5 -  5.0 g/dL   AST 53 (H) 15 - 41 U/L   ALT 24 0 - 44 U/L   Alkaline Phosphatase 121 38 - 126 U/L   Total Bilirubin 0.7 0.3 - 1.2 mg/dL   GFR calc non Af Amer 13 (L) >60 mL/min   GFR calc Af Amer 16 (L) >60 mL/min   Anion gap 12 5 - 15    Comment: Performed at [facility] [address], [place], [place] [address]  CBC WITH DIFFERENTIAL     Status: Abnormal   Collection Time: 07/26/20 11:45 PM  Result Value Ref Range   WBC 12.0 (H) 4.0 - 10.5 K/uL   RBC 3.19 (L) 4.22 - 5.81 MIL/uL   Hemoglobin 9.8 (L) 13.0 - 17.0 g/dL   HCT 29.5 (L) 39 - 52 %   MCV 92.5 80.0 - 100.0 fL   MCH 30.7 26.0 - 34.0 pg   MCHC 33.2 30.0 - 36.0 g/dL   RDW 13.2 11.5 - 15.5 %   Platelets 158  150 - 400 K/uL   nRBC 0.0 0.0 - 0.2 %   Neutrophils Relative % 83 %   Neutro Abs 9.9 (H) 1.7 - 7.7 K/uL   Lymphocytes Relative 10 %   Lymphs Abs 1.2 0.7 - 4.0 K/uL   Monocytes Relative 7 %   Monocytes Absolute 0.8 0 - 1 K/uL   Eosinophils Relative 0 %   Eosinophils Absolute 0.0 0 - 0 K/uL   Basophils Relative 0 %   Basophils Absolute 0.0 0 - 0 K/uL   Immature Granulocytes 0 %   Abs Immature Granulocytes 0.04 0.00 - 0.07 K/uL    Comment: Performed at [facility], [place] [address], [place], [place] [address]  APTT     Status: None   Collection Time: 07/26/20 11:45 PM  Result Value Ref Range   aPTT 30 24 - 36 seconds    Comment: Performed at [facility] [address], [place], [place] [address]  Protime-INR     Status: None   Collection Time: 07/26/20 11:45 PM  Result Value Ref Range   Prothrombin Time 14.6 11.4 - 15.2 seconds   INR 1.2 0.8 - 1.2    Comment: (NOTE) INR goal varies based on device and disease states. Performed at [facility], [place] [address], [place], [place] [address]   Blood Culture (routine x 2)     Status: None (Preliminary result)   Collection Time: 07/26/20 11:45 PM   Specimen: BLOOD  Result Value Ref Range   Specimen Description BLOOD LEFT HAND    Special Requests      BOTTLES DRAWN AEROBIC AND ANAEROBIC Blood Culture adequate volume   Culture      NO GROWTH 1 DAY Performed at [facility], [place] [address] [place], [place] [address]    Report Status PENDING   Procalcitonin     Status: None   Collection Time: 07/26/20 11:45 PM  Result Value Ref Range   Procalcitonin 0.55 ng/mL    Comment:        Interpretation: PCT > 0.5 ng/mL and <= 2 ng/mL: Systemic infection (sepsis) is possible, but other conditions are known to elevate PCT as well. (NOTE)       Sepsis PCT Algorithm           Lower Respiratory Tract  Infection PCT Algorithm    ----------------------------      ----------------------------         PCT < 0.25 ng/mL                PCT < 0.10 ng/mL          Strongly encourage             Strongly discourage   discontinuation of antibiotics    initiation of antibiotics    ----------------------------     -----------------------------       PCT 0.25 - 0.50 ng/mL            PCT 0.10 - 0.25 ng/mL               OR       >80% decrease in PCT            Discourage initiation of                                            antibiotics      Encourage discontinuation           of antibiotics    ----------------------------     -----------------------------         PCT >= 0.50 ng/mL              PCT 0.26 - 0.50 ng/mL                AND       <80% decrease in PCT             Encourage initiation of                                             antibiotics       Encourage continuation           of antibiotics    ----------------------------     -----------------------------        PCT >= 0.50 ng/mL                  PCT > 0.50 ng/mL               AND         increase in PCT                  Strongly encourage                                      initiation of antibiotics    Strongly encourage escalation           of antibiotics                                     -----------------------------                                           PCT <= 0.25 ng/mL  OR                                        > 80% decrease in PCT                                      Discontinue / Do not initiate                                             antibiotics  Performed at [facility], [place] [address], [place], [place] [address]   Blood Culture (routine x 2)     Status: None (Preliminary result)   Collection Time: 07/27/20 12:04 AM   Specimen: BLOOD  Result Value Ref Range   Specimen Description BLOOD LEFT ARM    Special Requests      BOTTLES DRAWN AEROBIC AND ANAEROBIC Blood Culture adequate volume   Culture      NO  GROWTH 1 DAY Performed at [facility], [place] [address], [place], [place] [address]    Report Status PENDING   SARS Coronavirus 2 by RT PCR (hospital order, performed in [facility] hospital lab) Nasopharyngeal Nasopharyngeal Swab     Status: None   Collection Time: 07/27/20  2:59 AM   Specimen: Nasopharyngeal Swab  Result Value Ref Range   SARS Coronavirus 2 NEGATIVE NEGATIVE    Comment: (NOTE) SARS-CoV-2 target nucleic acids are NOT DETECTED.  The SARS-CoV-2 RNA is generally detectable in upper and lower respiratory specimens during the acute phase of infection. The lowest concentration of SARS-CoV-2 viral copies this assay can detect is 250 copies / mL. A negative result does not preclude SARS-CoV-2 infection and should not be used as the sole basis for treatment or other patient management decisions.  A negative result may occur with improper specimen collection / handling, submission of specimen other than nasopharyngeal swab, presence of viral mutation(s) within the areas targeted by this assay, and inadequate number of viral copies (<250 copies / mL). A negative result must be combined with clinical observations, patient history, and epidemiological information.  Fact Sheet for Patients:   StrictlyIdeas.no  Fact Sheet for Healthcare Providers: BankingDealers.co.za  This test is not yet approved or  cleared by the [place] FDA and has been authorized for detection and/or diagnosis of SARS-CoV-2 by FDA under an Emergency Use Authorization (EUA).  This EUA will remain in effect (meaning this test can be used) for the duration of the COVID-19 declaration under Section 564(b)(1) of the Act, 21 U.S.C. section 360bbb-3(b)(1), unless the authorization is terminated or revoked sooner.  Performed at [facility], [place] [address], [place], [place] [address]   Magnesium     Status: Abnormal   Collection Time: 07/27/20   6:39 AM  Result Value Ref Range   Magnesium 1.5 (L) 1.7 - 2.4 mg/dL    Comment: Performed at [facility] [address] [place], [place] [address]  Hemoglobin A1c     Status: Abnormal   Collection Time: 07/27/20  6:39 AM  Result Value Ref Range   Hgb A1c MFr Bld 7.2 (H) 4.8 - 5.6 %    Comment: (NOTE) Pre diabetes:  5.7%-6.4%  Diabetes:              >6.4%  Glycemic control for   <7.0% adults with diabetes    Mean Plasma Glucose 159.94 mg/dL    Comment: Performed at [facility], [place] [address], [place], [place] [address]  CBC     Status: Abnormal   Collection Time: 07/27/20  6:39 AM  Result Value Ref Range   WBC 10.0 4.0 - 10.5 K/uL   RBC 3.11 (L) 4.22 - 5.81 MIL/uL   Hemoglobin 9.9 (L) 13.0 - 17.0 g/dL   HCT 29.1 (L) 39 - 52 %   MCV 93.6 80.0 - 100.0 fL   MCH 31.8 26.0 - 34.0 pg   MCHC 34.0 30.0 - 36.0 g/dL   RDW 13.4 11.5 - 15.5 %   Platelets 142 (L) 150 - 400 K/uL   nRBC 0.0 0.0 - 0.2 %    Comment: Performed at [facility], [place] [address] [place], [place] [address]  Basic metabolic panel     Status: Abnormal   Collection Time: 07/27/20  6:39 AM  Result Value Ref Range   Sodium 131 (L) 135 - 145 mmol/L   Potassium 3.0 (L) 3.5 - 5.1 mmol/L   Chloride 92 (L) 98 - 111 mmol/L   CO2 28 22 - 32 mmol/L   Glucose, Bld 151 (H) 70 - 99 mg/dL    Comment: Glucose reference range applies only to samples taken after fasting for at least 8 hours.   BUN 30 (H) 8 - 23 mg/dL   Creatinine, Ser 4.67 (H) 0.61 - 1.24 mg/dL   Calcium 8.4 (L) 8.9 - 10.3 mg/dL   GFR calc non Af Amer 12 (L) >60 mL/min   GFR calc Af Amer 14 (L) >60 mL/min   Anion gap 11 5 - 15    Comment: Performed at [facility] [address], [place], [place] [address]  CBG monitoring, ED     Status: Abnormal   Collection Time: 07/27/20  7:51 AM  Result Value Ref Range   Glucose-Capillary 153 (H) 70 - 99 mg/dL    Comment: Glucose reference range applies only to samples taken after  fasting for at least 8 hours.   Comment 1 Notify RN    Comment 2 Document in Chart   Urinalysis, Routine w reflex microscopic     Status: Abnormal   Collection Time: 07/27/20 10:50 AM  Result Value Ref Range   Color, Urine AMBER (A) YELLOW    Comment: BIOCHEMICALS MAY BE AFFECTED BY COLOR   APPearance CLOUDY (A) CLEAR   Specific Gravity, Urine 1.020 1.005 - 1.030   pH 5.0 5.0 - 8.0   Glucose, UA 150 (A) NEGATIVE mg/dL   Hgb urine dipstick LARGE (A) NEGATIVE   Bilirubin Urine SMALL (A) NEGATIVE   Ketones, ur NEGATIVE NEGATIVE mg/dL   Protein, ur >=300 (A) NEGATIVE mg/dL   Nitrite NEGATIVE NEGATIVE   Leukocytes,Ua NEGATIVE NEGATIVE   RBC / HPF 11-20 0 - 5 RBC/hpf   WBC, UA 11-20 0 - 5 WBC/hpf   Bacteria, UA RARE (A) NONE SEEN   Squamous Epithelial / LPF 0-5 0 - 5   Mucus PRESENT    Hyaline Casts, UA PRESENT    Granular Casts, UA PRESENT     Comment: Performed at [facility], [address] [place], [place] [address]  Urine culture     Status: None (Preliminary result)   Collection  Time: 07/27/20 10:52 AM   Specimen: In/Out Cath Urine  Result Value Ref Range   Specimen Description IN/OUT CATH URINE    Special Requests NONE    Culture      CULTURE REINCUBATED FOR BETTER GROWTH Performed at [facility], [place] [address], [place], [place] [address]    Report Status PENDING   CBG monitoring, ED     Status: Abnormal   Collection Time: 07/27/20 12:14 PM  Result Value Ref Range   Glucose-Capillary 205 (H) 70 - 99 mg/dL    Comment: Glucose reference range applies only to samples taken after fasting for at least 8 hours.  CBG monitoring, ED     Status: Abnormal   Collection Time: 07/27/20  4:55 PM  Result Value Ref Range   Glucose-Capillary 156 (H) 70 - 99 mg/dL    Comment: Glucose reference range applies only to samples taken after fasting for at least 8 hours.  Glucose, capillary     Status: Abnormal   Collection Time: 07/27/20  9:07 PM  Result Value Ref Range    Glucose-Capillary 168 (H) 70 - 99 mg/dL    Comment: Glucose reference range applies only to samples taken after fasting for at least 8 hours.  MRSA PCR Screening     Status: None   Collection Time: 07/27/20 10:19 PM   Specimen: Nasal Mucosa; Nasopharyngeal  Result Value Ref Range   MRSA by PCR NEGATIVE NEGATIVE    Comment:        The GeneXpert MRSA Assay (FDA approved for NASAL specimens only), is one component of a comprehensive MRSA colonization surveillance program. It is not intended to diagnose MRSA infection nor to guide or monitor treatment for MRSA infections. Performed at [facility], [place] [address] [place], [place] [address]   Glucose, capillary     Status: Abnormal   Collection Time: 07/28/20  8:03 AM  Result Value Ref Range   Glucose-Capillary 272 (H) 70 - 99 mg/dL    Comment: Glucose reference range applies only to samples taken after fasting for at least 8 hours.  Glucose, capillary     Status: Abnormal   Collection Time: 07/28/20 11:58 AM  Result Value Ref Range   Glucose-Capillary 104 (H) 70 - 99 mg/dL    Comment: Glucose reference range applies only to samples taken after fasting for at least 8 hours.  CBC with Differential/Platelet     Status: Abnormal   Collection Time: 07/28/20  4:05 PM  Result Value Ref Range   WBC 6.6 4.0 - 10.5 K/uL   RBC 3.18 (L) 4.22 - 5.81 MIL/uL   Hemoglobin 9.9 (L) 13.0 - 17.0 g/dL   HCT 29.1 (L) 39 - 52 %   MCV 91.5 80.0 - 100.0 fL   MCH 31.1 26.0 - 34.0 pg   MCHC 34.0 30.0 - 36.0 g/dL   RDW 13.1 11.5 - 15.5 %   Platelets 150 150 - 400 K/uL   nRBC 0.0 0.0 - 0.2 %   Neutrophils Relative % 69 %   Neutro Abs 4.6 1.7 - 7.7 K/uL   Lymphocytes Relative 17 %   Lymphs Abs 1.1 0.7 - 4.0 K/uL   Monocytes Relative 10 %   Monocytes Absolute 0.7 0 - 1 K/uL   Eosinophils Relative 3 %   Eosinophils Absolute 0.2 0 - 0 K/uL   Basophils Relative 1 %   Basophils Absolute 0.0 0 - 0 K/uL   Immature Granulocytes 0 %  Abs Immature  Granulocytes 0.02 0.00 - 0.07 K/uL    Comment: Performed at [facility], [place] [address], [place], [place] [address]  Glucose, capillary     Status: Abnormal   Collection Time: 07/28/20  4:33 PM  Result Value Ref Range   Glucose-Capillary 153 (H) 70 - 99 mg/dL    Comment: Glucose reference range applies only to samples taken after fasting for at least 8 hours.    MR FOOT LEFT WO CONTRAST  Result Date: 07/27/2020 CLINICAL DATA:  Question of osteomyelitis with area of ulceration EXAM: MRI OF THE LEFT FOOT WITHOUT CONTRAST TECHNIQUE: Multiplanar, multisequence MR imaging of the left hindfoot was performed. No intravenous contrast was administered. COMPARISON:  None. FINDINGS: Bones/Joint/Cartilage There is T2 hyperintense signal with subtle T1 hypointensity involving the posterior calcaneus. There is subtle area of cortical irregularity seen at the posterior calcaneus. No periosteal reaction. No osseous fracture seen. Normal osseous marrow signal seen throughout. No large joint effusion. There is trace subtalar and ankle joint effusion. Ligaments Visualized portions are intact. Muscles and Tendons Mildly increased signal is seen throughout the ankle and foot with diffuse mild fatty atrophy. The visualized portions of the flexor and extensor tendons are intact. The Achilles tendon is intact. Minimally increased signal seen at the insertion site of the Achilles tendon. The plantar fascia appears to be intact. Soft tissues Area of superficial ulceration overlying the posterior calcaneus with diffuse subcutaneous in heel pad inflammation. There is diffuse skin thickening. No loculated fluid collections or sinus tract is seen. IMPRESSION: Area of superficial ulceration of the posterior calcaneus with diffuse heel pad inflammation. No sinus tract or abscess. Findings suggestive of acute osteomyelitis involving the posterior calcaneus. No osseous fracture. Mild Achilles tendinosis. Diffuse muscular  edema/denervation atrophy. Electronically Signed   By: Prudencio Pair M.D.   On: 07/27/2020 19:22   DG Chest Port 1 View  Result Date: 07/26/2020 CLINICAL DATA:  Fever EXAM: PORTABLE CHEST 1 VIEW COMPARISON:  09/08/2019 FINDINGS: The heart size and mediastinal contours are within normal limits. Aortic atherosclerosis. Both lungs are clear. The visualized skeletal structures are unremarkable. IMPRESSION: No active disease. Electronically Signed   By: Donavan Foil M.D.   On: 07/26/2020 23:39   DG Foot 2 Views Left  Result Date: 07/27/2020 CLINICAL DATA:  Foot infection, open wound EXAM: LEFT FOOT - 2 VIEW COMPARISON:  06/18/2020 FINDINGS: Soft tissue wound is noted over the heel. No definitive bony erosive changes to suggest osteomyelitis are noted. No acute fracture or dislocation is seen. IMPRESSION: Soft tissue wound without acute findings of osteomyelitis. Electronically Signed   By: Inez Catalina M.D.   On: 07/27/2020 03:15    Review of Systems  Constitutional: Positive for fever (On admission) and malaise/fatigue (On admission). Negative for chills.  Respiratory: Negative for cough.   Cardiovascular: Negative for chest pain and leg swelling.  Musculoskeletal: Positive for joint pain (Left heel).  Skin: Negative for itching and rash.  All other systems reviewed and are negative.  Blood pressure (!) 117/58, pulse 77, temperature 98.4 F (36.9 C), temperature source Oral, resp. rate (!) 11, height 5\' 9"  (1.753 m), weight 72.2 kg, SpO2 97 %.  Vitals:   07/28/20 0430 07/28/20 0800  BP: 99/75 (!) 117/58  Pulse: 89 77  Resp: 20 (!) 11  Temp: 98.6 F (37 C) 98.4 F (36.9 C)  SpO2: 97% 97%    General AA&O x3. Normal mood and affect.  Vascular Dorsalis pedis and posterior tibial  pulses  barely palpable left   Pedal hair growth diminished.  Neurologic Epicritic sensation grossly Reduced.  Dermatologic (Wound) Wound Location: L heel, R heel Wound Base: Necrotic eschar Peri-wound:  Reddened, Macerated Exudate: Scant/small amount Serous exudate  Orthopedic: Motor intact BLE.    Assessment/Plan:   -Imaging: Studies independently reviewed as well as the radiology report from the MRI. -Antibiotics: Currently on cefepime and vancomycin -WB Status: Nonweightbearing bilateral LE -Wound Care: Okay to continue Betadine paint  -The heels to be offloaded at all times with pillows under the calf muscles and the feet From pressure on the footboard of the bed either with a interposed pillow or removal of the foot board  -Surgical Plan: Today I discussed with Richardson Landry and also his wife [name] by phone the MRI findings.  His wound is not significantly deteriorated from the outpatient setting aside from erythema around the wound laterally.  However, the MRI findings are concerning for osteomyelitis and given the proximity of the wound bed to the calcaneus which has very little soft tissue covering over this normally, I believe it be prudent to determine if there is infection there with a biopsy and bone culture.  We will plan to do this in the operating room tomorrow 07/29/2020 under IV sedation with local anesthesia.  We discussed the risk, units and complications associated with this.  Addressed all questions.  -N.p.o. past midnight -If possible, would recommend stopping antibiotics for now in order to increase culture yield  -Long-term we also discussed treatment options in the event that infection is found.  I discussed with Richardson Landry that infection of the heel bone is hard to treat and usually involves combination of surgical resection and 6 weeks of intravenous antibiotics.  Discussed with him the possibility of partial calcanectomy with or without a local wound flap to cover the wound and this is a lengthy surgery, often involving multiple stages, and has a low success rate but is possible.  I do think that he would be able to rehab a below-knee amputation with a lower limb prosthetic faster  and with fewer surgeries this would be more predictable for him, especially considering his age and body habitus.  He does not ambulate a significant amount outside of his home.  He said he understands this and that his preference would to be to save his limb did not have an amputation.  We will revisit this further once we have culture and pathology data from the biopsy.  [name] 07/28/2020, 8:14 PM   Best available via secure chat for questions or concerns.

## 2020-07-29 ENCOUNTER — Encounter (HOSPITAL_COMMUNITY): Payer: Self-pay | Admitting: Family Medicine

## 2020-07-29 ENCOUNTER — Inpatient Hospital Stay (HOSPITAL_COMMUNITY): Payer: BC Managed Care – PPO | Admitting: Certified Registered"

## 2020-07-29 ENCOUNTER — Encounter (HOSPITAL_COMMUNITY)
Admission: EM | Disposition: A | Discharge status: Home Health | Payer: Self-pay | Source: Home / Self Care | Attending: Internal Medicine

## 2020-07-29 ENCOUNTER — Inpatient Hospital Stay (HOSPITAL_COMMUNITY): Payer: BC Managed Care – PPO

## 2020-07-29 ENCOUNTER — Other Ambulatory Visit (HOSPITAL_COMMUNITY): Payer: BC Managed Care – PPO

## 2020-07-29 DIAGNOSIS — Z992 Dependence on renal dialysis: Secondary | ICD-10-CM | POA: Diagnosis not present

## 2020-07-29 DIAGNOSIS — M86172 Other acute osteomyelitis, left ankle and foot: Secondary | ICD-10-CM

## 2020-07-29 DIAGNOSIS — E1129 Type 2 diabetes mellitus with other diabetic kidney complication: Secondary | ICD-10-CM | POA: Diagnosis not present

## 2020-07-29 DIAGNOSIS — N186 End stage renal disease: Secondary | ICD-10-CM | POA: Diagnosis not present

## 2020-07-29 DIAGNOSIS — I340 Nonrheumatic mitral (valve) insufficiency: Secondary | ICD-10-CM

## 2020-07-29 HISTORY — PX: BONE BIOPSY: SHX375

## 2020-07-29 LAB — GLUCOSE, CAPILLARY
Glucose-Capillary: 210 mg/dL — ABNORMAL HIGH (ref 70–99)
Glucose-Capillary: 179 mg/dL — ABNORMAL HIGH (ref 70–99)
Glucose-Capillary: 203 mg/dL — ABNORMAL HIGH (ref 70–99)
Glucose-Capillary: 238 mg/dL — ABNORMAL HIGH (ref 70–99)

## 2020-07-29 LAB — CBC WITH DIFFERENTIAL/PLATELET
Abs Immature Granulocytes: 0.03 10*3/uL (ref 0.00–0.07)
Basophils Absolute: 0.1 10*3/uL (ref 0.0–0.1)
Basophils Relative: 1 %
Eosinophils Absolute: 0.2 10*3/uL (ref 0.0–0.5)
Eosinophils Relative: 3 %
HCT: 29.6 % — ABNORMAL LOW (ref 39.0–52.0)
Hemoglobin: 9.7 g/dL — ABNORMAL LOW (ref 13.0–17.0)
Immature Granulocytes: 1 %
Lymphocytes Relative: 18 %
Lymphs Abs: 1.1 10*3/uL (ref 0.7–4.0)
MCH: 30.4 pg (ref 26.0–34.0)
MCHC: 32.8 g/dL (ref 30.0–36.0)
MCV: 92.8 fL (ref 80.0–100.0)
Monocytes Absolute: 0.5 10*3/uL (ref 0.1–1.0)
Monocytes Relative: 9 %
Neutro Abs: 4.2 10*3/uL (ref 1.7–7.7)
Neutrophils Relative %: 68 %
Platelets: 157 10*3/uL (ref 150–400)
RBC: 3.19 MIL/uL — ABNORMAL LOW (ref 4.22–5.81)
RDW: 13.2 % (ref 11.5–15.5)
WBC: 6.1 10*3/uL (ref 4.0–10.5)
nRBC: 0 % (ref 0.0–0.2)

## 2020-07-29 LAB — RENAL FUNCTION PANEL
Albumin: 2.5 g/dL — ABNORMAL LOW (ref 3.5–5.0)
Anion gap: 12 (ref 5–15)
BUN: 53 mg/dL — ABNORMAL HIGH (ref 8–23)
CO2: 26 mmol/L (ref 22–32)
Calcium: 8.4 mg/dL — ABNORMAL LOW (ref 8.9–10.3)
Chloride: 95 mmol/L — ABNORMAL LOW (ref 98–111)
Creatinine, Ser: 6.67 mg/dL — ABNORMAL HIGH (ref 0.61–1.24)
GFR calc Af Amer: 9 mL/min — ABNORMAL LOW (ref >60)
GFR calc non Af Amer: 8 mL/min — ABNORMAL LOW (ref >60)
Glucose, Bld: 247 mg/dL — ABNORMAL HIGH (ref 70–99)
Phosphorus: 3.1 mg/dL (ref 2.5–4.6)
Potassium: 3.3 mmol/L — ABNORMAL LOW (ref 3.5–5.1)
Sodium: 133 mmol/L — ABNORMAL LOW (ref 135–145)

## 2020-07-29 LAB — URINE CULTURE: Culture: 1000 — AB

## 2020-07-29 LAB — BASIC METABOLIC PANEL
Anion gap: 8 (ref 5–15)
BUN: 17 mg/dL (ref 8–23)
CO2: 29 mmol/L (ref 22–32)
Calcium: 8.1 mg/dL — ABNORMAL LOW (ref 8.9–10.3)
Chloride: 100 mmol/L (ref 98–111)
Creatinine, Ser: 3.3 mg/dL — ABNORMAL HIGH (ref 0.61–1.24)
GFR calc Af Amer: 22 mL/min — ABNORMAL LOW (ref >60)
GFR calc non Af Amer: 19 mL/min — ABNORMAL LOW (ref >60)
Glucose, Bld: 167 mg/dL — ABNORMAL HIGH (ref 70–99)
Potassium: 3.7 mmol/L (ref 3.5–5.1)
Sodium: 137 mmol/L (ref 135–145)

## 2020-07-29 LAB — CBC
HCT: 26.8 % — ABNORMAL LOW (ref 39.0–52.0)
Hemoglobin: 9 g/dL — ABNORMAL LOW (ref 13.0–17.0)
MCH: 30.9 pg (ref 26.0–34.0)
MCHC: 33.6 g/dL (ref 30.0–36.0)
MCV: 92.1 fL (ref 80.0–100.0)
Platelets: 158 10*3/uL (ref 150–400)
RBC: 2.91 MIL/uL — ABNORMAL LOW (ref 4.22–5.81)
RDW: 13.1 % (ref 11.5–15.5)
WBC: 5.1 10*3/uL (ref 4.0–10.5)
nRBC: 0 % (ref 0.0–0.2)

## 2020-07-29 LAB — ECHOCARDIOGRAM COMPLETE
AR max vel: 3.06 cm2
AV Area VTI: 3.07 cm2
AV Area mean vel: 2.77 cm2
AV Mean grad: 4.3 mmHg
AV Peak grad: 8.1 mmHg
Ao pk vel: 1.42 m/s
Area-P 1/2: 3.91 cm2
Height: 69 in
MV M vel: 4.98 m/s
MV Peak grad: 99.2 mmHg
Radius: 0.3 cm
S' Lateral: 3.7 cm
Weight: 2546.75 oz

## 2020-07-29 SURGERY — BIOPSY, BONE
Anesthesia: Monitor Anesthesia Care | Site: Foot | Laterality: Left

## 2020-07-29 MED ORDER — PROPOFOL 10 MG/ML IV BOLUS
INTRAVENOUS | Status: AC
  Filled 2020-07-29: qty 20

## 2020-07-29 MED ORDER — FENTANYL CITRATE (PF) 250 MCG/5ML IJ SOLN
INTRAMUSCULAR | Status: DC | PRN
  Administered 2020-07-29: 50 ug via INTRAVENOUS

## 2020-07-29 MED ORDER — ONDANSETRON HCL 4 MG/2ML IJ SOLN
INTRAMUSCULAR | Status: DC | PRN
  Administered 2020-07-29: 4 mg via INTRAVENOUS

## 2020-07-29 MED ORDER — PROPOFOL 500 MG/50ML IV EMUL
INTRAVENOUS | Status: DC | PRN
  Administered 2020-07-29: 100 ug/kg/min via INTRAVENOUS

## 2020-07-29 MED ORDER — RENA-VITE PO TABS
1.0000 | ORAL_TABLET | Freq: Every day | ORAL | Status: DC
  Administered 2020-07-30 – 2020-08-01 (×3): 1 via ORAL
  Filled 2020-07-29 (×3): qty 1

## 2020-07-29 MED ORDER — BUPIVACAINE HCL (PF) 0.5 % IJ SOLN
INTRAMUSCULAR | Status: AC
  Filled 2020-07-29: qty 30

## 2020-07-29 MED ORDER — SODIUM CHLORIDE 0.9 % IV SOLN: INTRAVENOUS | Status: DC | PRN

## 2020-07-29 MED ORDER — PROPOFOL 10 MG/ML IV BOLUS
INTRAVENOUS | Status: DC | PRN
  Administered 2020-07-29: 20 mg via INTRAVENOUS

## 2020-07-29 MED ORDER — VANCOMYCIN HCL 750 MG/150ML IV SOLN
750.0000 mg | INTRAVENOUS | Status: DC
  Administered 2020-07-31: 750 mg via INTRAVENOUS
  Filled 2020-07-29: qty 150

## 2020-07-29 MED ORDER — BUPIVACAINE HCL 0.5 % IJ SOLN
INTRAMUSCULAR | Status: DC | PRN
  Administered 2020-07-29: 10 mL

## 2020-07-29 MED ORDER — HEPARIN SODIUM (PORCINE) 1000 UNIT/ML IJ SOLN
INTRAMUSCULAR | Status: AC
  Administered 2020-07-29: 3000 [IU]
  Filled 2020-07-29: qty 3

## 2020-07-29 MED ORDER — FENTANYL CITRATE (PF) 250 MCG/5ML IJ SOLN
INTRAMUSCULAR | Status: AC
  Filled 2020-07-29: qty 5

## 2020-07-29 MED ORDER — OMEPRAZOLE 20 MG PO CPDR
40.0000 mg | DELAYED_RELEASE_CAPSULE | Freq: Every day | ORAL | Status: DC
  Administered 2020-07-31 – 2020-08-01 (×2): 40 mg via ORAL
  Filled 2020-07-29 (×4): qty 2

## 2020-07-29 MED ORDER — FERRIC CITRATE 1 GM 210 MG(FE) PO TABS
210.0000 mg | ORAL_TABLET | Freq: Three times a day (TID) | ORAL | Status: DC
  Administered 2020-07-30 – 2020-08-01 (×8): 210 mg via ORAL
  Filled 2020-07-29 (×10): qty 1

## 2020-07-29 MED ORDER — DARBEPOETIN ALFA 60 MCG/0.3ML IJ SOSY
PREFILLED_SYRINGE | INTRAMUSCULAR | Status: AC
  Filled 2020-07-29: qty 0.3

## 2020-07-29 SURGICAL SUPPLY — 40 items
BNDG COHESIVE 4X5 TAN STRL (GAUZE/BANDAGES/DRESSINGS) ×2 IMPLANT
BNDG ELASTIC 4X5.8 VLCR STR LF (GAUZE/BANDAGES/DRESSINGS) ×2 IMPLANT
BNDG ELASTIC 6X5.8 VLCR STR LF (GAUZE/BANDAGES/DRESSINGS) ×2 IMPLANT
BNDG GAUZE ELAST 4 BULKY (GAUZE/BANDAGES/DRESSINGS) ×2 IMPLANT
BOOTCOVER CLEANROOM LRG (PROTECTIVE WEAR) ×4 IMPLANT
COVER SURGICAL LIGHT HANDLE (MISCELLANEOUS) ×2 IMPLANT
COVER WAND RF STERILE (DRAPES) ×2 IMPLANT
CUFF TOURN SGL QUICK 34 (TOURNIQUET CUFF)
CUFF TRNQT CYL 34X4.125X (TOURNIQUET CUFF) IMPLANT
DURAPREP 26ML APPLICATOR (WOUND CARE) ×2 IMPLANT
ELECT REM PT RETURN 9FT ADLT (ELECTROSURGICAL)
ELECTRODE REM PT RTRN 9FT ADLT (ELECTROSURGICAL) IMPLANT
EVACUATOR 1/8 PVC DRAIN (DRAIN) IMPLANT
GAUZE SPONGE 4X4 12PLY STRL (GAUZE/BANDAGES/DRESSINGS) ×2 IMPLANT
GAUZE XEROFORM 1X8 LF (GAUZE/BANDAGES/DRESSINGS) ×2 IMPLANT
GLOVE BIOGEL PI ORTHO PRO SZ8 (GLOVE) ×1
GLOVE ORTHO TXT STRL SZ7.5 (GLOVE) ×2 IMPLANT
GLOVE PI ORTHO PRO STRL SZ8 (GLOVE) ×1 IMPLANT
GLOVE SURG ORTHO 8.0 STRL STRW (GLOVE) ×4 IMPLANT
GOWN STRL REUS W/ TWL LRG LVL3 (GOWN DISPOSABLE) IMPLANT
GOWN STRL REUS W/TWL LRG LVL3 (GOWN DISPOSABLE)
HANDPIECE INTERPULSE COAX TIP (DISPOSABLE)
KIT BASIN OR (CUSTOM PROCEDURE TRAY) ×2 IMPLANT
KIT TURNOVER KIT B (KITS) ×2 IMPLANT
MANIFOLD NEPTUNE II (INSTRUMENTS) ×2 IMPLANT
NEEDLE BIOPSY JAMSHIDI 8X6 (NEEDLE) ×2 IMPLANT
NS IRRIG 1000ML POUR BTL (IV SOLUTION) ×2 IMPLANT
PACK ORTHO EXTREMITY (CUSTOM PROCEDURE TRAY) ×2 IMPLANT
PAD ARMBOARD 7.5X6 YLW CONV (MISCELLANEOUS) ×4 IMPLANT
SET HNDPC FAN SPRY TIP SCT (DISPOSABLE) IMPLANT
SPONGE LAP 18X18 RF (DISPOSABLE) ×2 IMPLANT
STOCKINETTE IMPERVIOUS 9X36 MD (GAUZE/BANDAGES/DRESSINGS) ×2 IMPLANT
SUT ETHILON 3 0 PS 1 (SUTURE) ×2 IMPLANT
SWAB CULTURE ESWAB REG 1ML (MISCELLANEOUS) IMPLANT
TOWEL GREEN STERILE (TOWEL DISPOSABLE) ×2 IMPLANT
TOWEL GREEN STERILE FF (TOWEL DISPOSABLE) ×2 IMPLANT
TUBE CONNECTING 12X1/4 (SUCTIONS) ×2 IMPLANT
UNDERPAD 30X36 HEAVY ABSORB (UNDERPADS AND DIAPERS) ×2 IMPLANT
WATER STERILE IRR 1000ML POUR (IV SOLUTION) ×2 IMPLANT
YANKAUER SUCT BULB TIP NO VENT (SUCTIONS) ×2 IMPLANT

## 2020-07-29 NOTE — Transfer of Care (Signed)
Immediate Anesthesia Transfer of Care Note  Patient: Duane Hall [facility]  Procedure(s) Performed: BONE BIOPSY (Left Foot)  Patient Location: PACU  Anesthesia Type:MAC  Level of Consciousness: awake, alert  and oriented  Airway & Oxygen Therapy: Patient Spontanous Breathing  Post-op Assessment: Report given to RN and Post -op Vital signs reviewed and stable  Post vital signs: Reviewed and stable  Last Vitals:  Vitals Value Taken Time  BP    Temp    Pulse 74 07/29/20 1007  Resp 12 07/29/20 1007  SpO2 99 % 07/29/20 1007  Vitals shown include unvalidated device data.  Last Pain:  Vitals:   07/29/20 0502  TempSrc: Oral  PainSc:          Complications: No complications documented.

## 2020-07-29 NOTE — Progress Notes (Signed)
PROGRESS NOTE    Duane Hall  RKY:[id number] DOB: 09-14-1956 DOA: 07/26/2020 PCP: Pieter Partridge, PA    Brief Narrative:  Patient admitted to the hospital with a working diagnosis of sepsis due to diabetic foot ulcer, end-organ failure hypotension.  64 year old male with significant past medical history for end-stage renal disease on hemodialysis who presents with confusion and low blood pressure. He was brought to the hospital due to worsening hypotension after hemodialysis. Reported worsening edema, erythema and odor at the wound site for several days. He had a recent admission for diabetic foot ulcer,related to peripheral vascular disease, s/pendarterectomy and balloon angioplasty to the left superior femoral artery.After administration of intravenous fluids blood pressure 131/58, pulse rate 82, respirate 17, temperature 98.7, oxygenation 99%. His lungs were clear to auscultation bilaterally, heart S1-S2, present rhythmic, soft abdomen, no lower extremity edema. Left foot with loca edema and large heal ulcer with necrotic scar, positive erythema and local tenderness along with increased local temperature. Sodium 132, potassium 2.9, chloride 91, bicarb 29, glucose 167, BUN 25, creatinine 4.39, lactic acid 1.1, white count 12.0, hemoglobin 9.8, hematocrit 29.5, platelets 158. SARS COVID-19 negative. Urinalysis 11-20 white cells, 11-20 red cells, specific gravity 1.020, >300 protein. Chest radiograph with hyperinflation, bibasilar atelectasis. EKG 90 bpm, normal axis, normal intervals, sinus rhythm, Q wave V1-V2, no ST segment or T wave changes, positive LVH.  Further work up with left foot MRI, positive for calcaneus bone osteomyelitis.   Patient underwent today, bone biopsy, depending on the results will approach next step.    Assessment & Plan:   Principal Problem:   Sepsis ([place]) Active Problems:   Type II diabetes mellitus (HCC)   Hypertension   Hepatitis C    Hypokalemia   Vascular insufficiency of extremity   Diabetic foot ulcer (HCC)   ESRD (end stage renal disease) ([place])   Acute osteomyelitis of left calcaneus (HCC)   1. Left foot calcaneus osteomyelitis in the setting of peripheral vascular disease, complicated with sepsis, end-organ failure hypotension (present on admission). Foot films with no bony lesions. Foot MRI with superficial ulceration overlying the posterior calcaneus, with diffuse subcutaneous inflammation, suggestive for osteomyelitis. Sepsis now has resolved.   Antibiotics her held for bone biopsy, now will resume IV vancomycin per pharmacy protocol. Will follow on bone culture/ biopsy.   Tolerating well asa, clopidogrel and statin therapy.   2. ESRD on HD/ with hypokalemia/ hypomagnesemia/ hyponatremia. Patient gets home dialysis, per right upper extremity fistula. Will follo with nephrology for further inpatient treatment.   3. T2DM/ dyslipidemia. Fasting glucose this am 167, continue with insulin sliding scale for glucose cover and monitoring.  Continue with rosuvastatin   4. HTN. His EKG has signs of LVH,  On midodrine with good toleration.   Echocardiogram with preserved LV systolic function.   5. Anemia of chronic renal disease.  Stable cell count.    Status is: Inpatient  Remains inpatient appropriate because:IV treatments appropriate due to intensity of illness or inability to take PO   Dispo: The patient is from: Home              Anticipated d/c is to: Home              Anticipated d/c date is: 3 days              Patient currently is not medically stable to d/c.   DVT prophylaxis: Heparin   Code Status:   full  Family Communication:  No  family at the bedside      Nutrition Status:           Skin Documentation:    Consultants:   Podiatry   Procedures:   Left foot calcaneous bone biopsy   Antimicrobials:   IV vancomycin     Subjective: Patient with no significant pain,  no nausea or vomiting, no chest pain. Continue to be very weak and deconditioned.   Objective: Vitals:   07/29/20 1000 07/29/20 1030 07/29/20 1054 07/29/20 1524  BP: (!) 96/53  (!) 116/54 (!) 117/59  Pulse: 74  80 79  Resp: (!) 10  18 16   Temp: 98 F (36.7 C) 98.4 F (36.9 C) 98.6 F (37 C) 98.5 F (36.9 C)  TempSrc:   Oral Oral  SpO2: 99%  97% 99%  Weight:      Height:        Intake/Output Summary (Last 24 hours) at 07/29/2020 1552 Last data filed at 07/29/2020 0954 Gross per 24 hour  Intake 645.84 ml  Output 595 ml  Net 50.84 ml   Filed Weights   07/26/20 2318 07/28/20 0430  Weight: 70.3 kg 72.2 kg    Examination:   General: deconditioned  Neurology: Awake and alert, non focal  E ENT: no pallor, no icterus, oral mucosa moist Cardiovascular: No JVD. S1-S2 present, rhythmic, no gallops, rubs, or murmurs. No lower extremity edema. Pulmonary: positive breath sounds bilaterally, adequate air movement, no wheezing, rhonchi or rales. Gastrointestinal. Abdomen soft and non tender Skin. Left foot with dressing in place.  Musculoskeletal: no joint deformities     Data Reviewed: I have personally reviewed following labs and imaging studies  CBC: Recent Labs  Lab 07/26/20 2345 07/27/20 0639 07/28/20 1605 07/28/20 2253  WBC 12.0* 10.0 6.6 5.1  NEUTROABS 9.9*  --  4.6  --   HGB 9.8* 9.9* 9.9* 9.0*  HCT 29.5* 29.1* 29.1* 26.8*  MCV 92.5 93.6 91.5 92.1  PLT 158 142* 150 086   Basic Metabolic Panel: Recent Labs  Lab 07/26/20 2345 07/27/20 0639 07/28/20 2253 07/29/20 0330  NA 132* 131* 133* 137  K 2.9* 3.0* 3.3* 3.7  CL 91* 92* 95* 100  CO2 29 28 26 29   GLUCOSE 167* 151* 247* 167*  BUN 25* 30* 53* 17  CREATININE 4.39* 4.67* 6.67* 3.30*  CALCIUM 8.9 8.4* 8.4* 8.1*  MG  --  1.5*  --   --   PHOS  --   --  3.1  --    GFR: Estimated Creatinine Clearance: 23.2 mL/min (A) (by C-G formula based on SCr of 3.3 mg/dL (H)). Liver Function Tests: Recent Labs  Lab  07/26/20 2345 07/28/20 2253  AST 53*  --   ALT 24  --   ALKPHOS 121  --   BILITOT 0.7  --   PROT 6.2*  --   ALBUMIN 3.0* 2.5*   No results for input(s): LIPASE, AMYLASE in the last 168 hours. No results for input(s): AMMONIA in the last 168 hours. Coagulation Profile: Recent Labs  Lab 07/26/20 2345  INR 1.2   Cardiac Enzymes: No results for input(s): CKTOTAL, CKMB, CKMBINDEX, TROPONINI in the last 168 hours. BNP (last 3 results) No results for input(s): PROBNP in the last 8760 hours. HbA1C: Recent Labs    07/27/20 0639  HGBA1C 7.2*   CBG: Recent Labs  Lab 07/28/20 1158 07/28/20 1633 07/28/20 2135 07/29/20 0851 07/29/20 1008  GLUCAP 104* 153* 222* 210* 179*   Lipid Profile: No results for  input(s): CHOL, HDL, LDLCALC, TRIG, CHOLHDL, LDLDIRECT in the last 72 hours. Thyroid Function Tests: No results for input(s): TSH, T4TOTAL, FREET4, T3FREE, THYROIDAB in the last 72 hours. Anemia Panel: No results for input(s): VITAMINB12, FOLATE, FERRITIN, TIBC, IRON, RETICCTPCT in the last 72 hours.    Radiology Studies: I have reviewed all of the imaging during this hospital visit personally     Scheduled Meds: . aspirin EC  81 mg Oral Daily  . clopidogrel  75 mg Oral Q breakfast  . darbepoetin (ARANESP) injection - DIALYSIS  60 mcg Intravenous Q Fri-HD  . gabapentin  300 mg Oral Daily  . heparin  5,000 Units Subcutaneous Q8H  . insulin aspart  0-15 Units Subcutaneous TID WC  . insulin aspart  0-5 Units Subcutaneous QHS  . magnesium oxide  400 mg Oral TID  . midodrine  10 mg Oral Once per day on Sun Tue Thu Sat  . multivitamin  1 tablet Oral QHS  . nicotine  14 mg Transdermal Daily  . pantoprazole  40 mg Oral Daily  . rosuvastatin  10 mg Oral Daily   Continuous Infusions: . [START ON 07/31/2020] vancomycin       LOS: 2 days        Kennith Morss Gerome Apley, MD

## 2020-07-29 NOTE — Progress Notes (Signed)
[facility] Progress Note   Dialysis Orders: [facility] - NxStage  3hrs, BFR 400, DFR 800, EDW 70kg , 2K  Access: R. Upper AV fistula  Heparin 3000 Midodrine 10mg  qHD  Assessment/Plan: 1. Sepsis likely d/t diabetic foot ulcer and vascular insufficiency of extremity - BC sent and pending. Wound care following.  IV Vanc and Maxepime per pharm. Left heel  MRI concerning for osteo - for bone bx today by podiatry. . Abx. Held until then. Fever spike this am. Also has pending urine culture 2. ESRD -  Nx stage pt - MWF here - K better 3.7 pre HD Friday - 4 K bath used - next HD Monday 3. Hypotension/volume  - On midodrine with HD for BP support.BP limiting UF net UF 594 ml Friday - lowest BP 74/44 - no wts- NEEDS dialysis weights next tmt 4. Anemia of CKD - Hgb 9.9>9   No recent ESA dose noted in chart.  Started Araneso 60 7/30 (q Friday) 5. Secondary Hyperparathyroidism -   Continue Auryxia. Ca/P ok 6. Nutrition - Albumin low.  Continue renal/carb modified diet w/ fluid restirction. - npo for procedure 7. Tobacco abuse - Started on nicotine patch per primary. 8. COPD - stable, home meds resumed per primary.  9. Diabetes mellitus - sliding scale insulin ordered, monitored by primary.    Myriam Jacobson, PA-C [place] 07/29/2020,7:30 AM  LOS: 2 days   Subjective:  Stomach better today.  No problems with HD late yesterday.  Objective Vitals:   07/29/20 0030 07/29/20 0100 07/29/20 0138 07/29/20 0502  BP: (!) 94/51 (!) 111/58 (!) 108/60 (!) 133/70  Pulse:   84 80  Resp:   16 18  Temp:   (!) 100.5 F (38.1 C) 98.5 F (36.9 C)  TempSrc:   Oral Oral  SpO2:    100%  Weight:      Height:       Physical Exam General:chronically ill appearing male breathing easily supine in bed Heart:  RRR Lungs:no rales Abdomen: soft Extremities: no sig LE edema; dressings on bilateral heels, SCDs in place Dialysis Access: right upper AVF +  bruit   Additional Objective Labs: Basic Metabolic Panel: Recent Labs  Lab 07/27/20 0639 07/28/20 2253 07/29/20 0330  NA 131* 133* 137  K 3.0* 3.3* 3.7  CL 92* 95* 100  CO2 28 26 29   GLUCOSE 151* 247* 167*  BUN 30* 53* 17  CREATININE 4.67* 6.67* 3.30*  CALCIUM 8.4* 8.4* 8.1*  PHOS  --  3.1  --    Liver Function Tests: Recent Labs  Lab 07/26/20 2345 07/28/20 2253  AST 53*  --   ALT 24  --   ALKPHOS 121  --   BILITOT 0.7  --   PROT 6.2*  --   ALBUMIN 3.0* 2.5*   No results for input(s): LIPASE, AMYLASE in the last 168 hours. CBC: Recent Labs  Lab 07/26/20 2345 07/26/20 2345 07/27/20 0639 07/28/20 1605 07/28/20 2253  WBC 12.0*   < > 10.0 6.6 5.1  NEUTROABS 9.9*  --   --  4.6  --   HGB 9.8*   < > 9.9* 9.9* 9.0*  HCT 29.5*   < > 29.1* 29.1* 26.8*  MCV 92.5  --  93.6 91.5 92.1  PLT 158   < > 142* 150 158   < > = values in this interval not displayed.   Blood Culture    Component Value Date/Time   SDES  IN/OUT CATH URINE 07/27/2020 1052   SPECREQUEST NONE 07/27/2020 1052   CULT  07/27/2020 1052    CULTURE REINCUBATED FOR BETTER GROWTH Performed at [facility] [address], [place], [place] [address]    REPTSTATUS PENDING 07/27/2020 1052    Cardiac Enzymes: No results for input(s): CKTOTAL, CKMB, CKMBINDEX, TROPONINI in the last 168 hours. CBG: Recent Labs  Lab 07/27/20 2107 07/28/20 0803 07/28/20 1158 07/28/20 1633 07/28/20 2135  GLUCAP 168* 272* 104* 153* 222*   Iron Studies: No results for input(s): IRON, TIBC, TRANSFERRIN, FERRITIN in the last 72 hours. Lab Results  Component Value Date   INR 1.2 07/26/2020   INR 1.1 09/14/2019   INR 1.1 09/10/2019   Studies/Results: MR FOOT LEFT WO CONTRAST  Result Date: 07/27/2020 CLINICAL DATA:  Question of osteomyelitis with area of ulceration EXAM: MRI OF THE LEFT FOOT WITHOUT CONTRAST TECHNIQUE: Multiplanar, multisequence MR imaging of the left hindfoot was performed. No intravenous  contrast was administered. COMPARISON:  None. FINDINGS: Bones/Joint/Cartilage There is T2 hyperintense signal with subtle T1 hypointensity involving the posterior calcaneus. There is subtle area of cortical irregularity seen at the posterior calcaneus. No periosteal reaction. No osseous fracture seen. Normal osseous marrow signal seen throughout. No large joint effusion. There is trace subtalar and ankle joint effusion. Ligaments Visualized portions are intact. Muscles and Tendons Mildly increased signal is seen throughout the ankle and foot with diffuse mild fatty atrophy. The visualized portions of the flexor and extensor tendons are intact. The Achilles tendon is intact. Minimally increased signal seen at the insertion site of the Achilles tendon. The plantar fascia appears to be intact. Soft tissues Area of superficial ulceration overlying the posterior calcaneus with diffuse subcutaneous in heel pad inflammation. There is diffuse skin thickening. No loculated fluid collections or sinus tract is seen. IMPRESSION: Area of superficial ulceration of the posterior calcaneus with diffuse heel pad inflammation. No sinus tract or abscess. Findings suggestive of acute osteomyelitis involving the posterior calcaneus. No osseous fracture. Mild Achilles tendinosis. Diffuse muscular edema/denervation atrophy. Electronically Signed   By: Prudencio Pair M.D.   On: 07/27/2020 19:22   Medications:  . aspirin EC  81 mg Oral Daily  . Chlorhexidine Gluconate Cloth  6 each Topical Q0600  . clopidogrel  75 mg Oral Q breakfast  . darbepoetin (ARANESP) injection - DIALYSIS  60 mcg Intravenous Q Fri-HD  . gabapentin  300 mg Oral Daily  . heparin  5,000 Units Subcutaneous Q8H  . insulin aspart  0-15 Units Subcutaneous TID WC  . insulin aspart  0-5 Units Subcutaneous QHS  . magnesium oxide  400 mg Oral TID  . midodrine  10 mg Oral Once per day on Sun Tue Thu Sat  . multivitamin  1 tablet Oral QHS  . nicotine  14 mg  Transdermal Daily  . pantoprazole  40 mg Oral Daily  . rosuvastatin  10 mg Oral Daily

## 2020-07-29 NOTE — Op Note (Addendum)
Patient Name: GRIGOR LIPSCHUTZ DOB: 1956/10/09  MRN: [id number]   Date of Service: 07/26/2020 - 07/29/2020  Surgeon: Dr. Lanae Crumbly, DPM Assistants: None Pre-operative Diagnosis:  1. chronic decubitus ulcer left heel 2.  Osteomyelitis left calcaneus Post-operative Diagnosis:  1. chronic decubitus ulcer left heel 2.  Osteomyelitis left calcaneus Procedures:  1) bone biopsy and culture left calcaneus Pathology/Specimens: ID Type Source Tests Collected by Time Destination  1 : Left Calcaneal Bone Biopsy Tissue PATH Bone biopsy SURGICAL PATHOLOGY [name], DPM 07/29/2020 0017   A : Left Calcaneal Bone Culture Tissue Bone AEROBIC/ANAEROBIC CULTURE (SURGICAL/DEEP WOUND) [name], DPM 07/29/2020 4944    Anesthesia: IV sedation Hemostasis: * No tourniquets in log * Estimated Blood Loss: 1 mL Materials: * No implants in log * Medications: 10 cc bupivacaine half percent plain Complications: None  Indications for Procedure:  This is a 64 y.o. male with a history of peripheral arterial disease, end-stage renal disease, type 2 diabetes with peripheral neuropathy, and a chronic decubitus ulcer of the left heel.  He was admitted for sepsis on 07/26/2020, MRI showed concern for osteomyelitis.  I recommended bone biopsy and tissue culture of the left calcaneus and he was in agreement with the plan.   Procedure in Detail: Patient was identified in pre-operative holding area. Formal consent was signed and the left lower extremity was marked. Patient was brought back to the operating room. Anesthesia was induced. The extremity was prepped and draped in the usual sterile fashion. Timeout was taken to confirm patient name, laterality, and procedure prior to incision.   Attention was then directed to the left lateral heel where a 1 cm incision was made.  This was carried deep to the level lateral calcaneal wall.  The position of this was measured to be consistent with the area of concern on the  MRI.  An 8g gauge Jamshidi needle was placed in the left calcaneus and advanced to the medial wall.  A core of bone was removed and sent for tissue culture and histopathology.  The wound was thoroughly irrigated and closed with 3-0 nylon.  The foot was then dressed with Betadine paint, dry sterile gauze dressings and Kerlix. Patient tolerated the procedure well.   Disposition: Following a period of post-operative monitoring, patient will be transferred back to his inpatient unit for further monitoring.

## 2020-07-29 NOTE — Progress Notes (Signed)
Received from 5W accompanied by RN and NT. Alert and oriented x 4. Oriented to floor set up.

## 2020-07-29 NOTE — Anesthesia Preprocedure Evaluation (Signed)
Anesthesia Evaluation  Patient identified by MRN, date of birth, ID band Patient awake    Reviewed: Allergy & Precautions, NPO status , Patient's Chart, lab work & pertinent test results  Airway Mallampati: II  TM Distance: >3 FB     Dental  (+) Dental Advisory Given   Pulmonary asthma , Current Smoker,    breath sounds clear to auscultation       Cardiovascular hypertension,  Rhythm:Regular Rate:Normal     Neuro/Psych negative neurological ROS     GI/Hepatic negative GI ROS, (+) Hepatitis -, C  Endo/Other  diabetes, Type 2, Oral Hypoglycemic Agents  Renal/GU ESRF and DialysisRenal disease     Musculoskeletal   Abdominal   Peds  Hematology  (+) anemia ,   Anesthesia Other Findings   Reproductive/Obstetrics                             Anesthesia Physical Anesthesia Plan  ASA: III  Anesthesia Plan: MAC   Post-op Pain Management:    Induction:   PONV Risk Score and Plan: 0 and Propofol infusion, Ondansetron and Treatment may vary due to age or medical condition  Airway Management Planned: Natural Airway and Simple Face Mask  Additional Equipment:   Intra-op Plan:   Post-operative Plan:   Informed Consent: I have reviewed the patients History and Physical, chart, labs and discussed the procedure including the risks, benefits and alternatives for the proposed anesthesia with the patient or authorized representative who has indicated his/her understanding and acceptance.       Plan Discussed with: CRNA  Anesthesia Plan Comments:         Anesthesia Quick Evaluation

## 2020-07-29 NOTE — Progress Notes (Signed)
   07/29/20 0138  Hand-Off documentation  Handoff Given Given to shift RN/LPN  Report given to (Full Name) Aldean Jewett, RN  Handoff Received Received from shift RN/LPN  Report received from (Full Name) Cynethia Schindler, RN  Vital Signs  Temp (!) 100.5 F (38.1 C)  Temp Source Oral  Pulse Rate 84  Pulse Rate Source Monitor  Resp 16  BP (!) 108/60  BP Location Right Arm  BP Method Automatic  Pain Assessment  Pain Scale 0-10  Pain Score 0  Post-Hemodialysis Assessment  Rinseback Volume (mL) 250 mL  KECN 258 V  Dialyzer Clearance Lightly streaked  Duration of HD Treatment -hour(s) 3 hour(s)  Hemodialysis Intake (mL) 500 mL  UF Total -Machine (mL) 1094 mL  Net UF (mL) 594 mL  Tolerated HD Treatment Yes  Post-Hemodialysis Comments tx achieved as expected, well tolerated, no complaints, pt is stable.  AVG/AVF Arterial Site Held (minutes) 10 minutes  AVG/AVF Venous Site Held (minutes) 10 minutes  Education / Care Plan  Dialysis Education Provided Yes  Fistula / Graft Right Upper arm Arteriovenous fistula  Placement Date/Time: 09/14/19 1251   Placed prior to admission: No  Orientation: Right  Access Location: Upper arm  Access Type: Arteriovenous fistula  Site Condition No complications  Fistula / Graft Assessment Present;Thrill;Bruit  Status Deaccessed

## 2020-07-29 NOTE — Progress Notes (Signed)
°  Echocardiogram 2D Echocardiogram has been performed.  Johny Chess 07/29/2020, 2:28 PM

## 2020-07-29 NOTE — Progress Notes (Signed)
Pharmacy Antibiotic Note  Duane Hall is a 64 y.o. male admitted on 07/26/2020 with osteomyelitis s/p I&D 7/31. Pt is ESRD on HD MWF with last HD overnight 7/30 pm. ABX briefly held pre-op by podiatry to increase culture yield, now pharmacy to resume vancomycin.  Plan: Vancomycin 750mg  IV qHD MWF F/U cultures, LOT, HD schedule   Height: 5\' 9"  (175.3 cm) Weight: 72.2 kg (159 lb 2.8 oz) IBW/kg (Calculated) : 70.7  Temp (24hrs), Avg:98.7 F (37.1 C), Min:98 F (36.7 C), Max:100.5 F (38.1 C)  Recent Labs  Lab 07/26/20 2345 07/27/20 0639 07/28/20 1605 07/28/20 2253 07/29/20 0330  WBC 12.0* 10.0 6.6 5.1  --   CREATININE 4.39* 4.67*  --  6.67* 3.30*  LATICACIDVEN 1.1  --   --   --   --     Estimated Creatinine Clearance: 23.2 mL/min (A) (by C-G formula based on SCr of 3.3 mg/dL (H)).    No Known Allergies  Antimicrobials: Cefepime 7/29 >> 7/31 Vancomycin 7/29 >>  Microbiology: 7/28 BCx: NGTD 7/29 UCx: 1k GPCs 7/29 MRSA PCR: negative   Arrie Senate, PharmD, BCPS Clinical Pharmacist [phone] Please check AMION for all [facility] numbers 07/29/2020

## 2020-07-29 NOTE — Brief Op Note (Signed)
07/26/2020 - 07/29/2020  10:04 AM  PATIENT:  Duane Hall  64 y.o. male  PRE-OPERATIVE DIAGNOSIS:  osteomyelitis left calcaneus  POST-OPERATIVE DIAGNOSIS:  osteomyelitis left calcaneus  PROCEDURE:  Procedure(s) with comments: BONE BIOPSY (Left)    SURGEON:  Surgeon(s) and Role:    * Dragan Tamburrino, Stephan Minister, DPM - Primary   ASSISTANTS: none   ANESTHESIA:   local and IV sedation  EBL:  1 mL   BLOOD ADMINISTERED:none  DRAINS: none   LOCAL MEDICATIONS USED:  BUPIVICAINE 10 cc half percent plain  SPECIMEN:  Source of Specimen:  Left calcaneus bone biopsy and bone culture  DISPOSITION OF SPECIMEN:  Microbiology and histopathology  COUNTS:  YES  TOURNIQUET: None  DICTATION: .Note written in EPIC  PLAN OF CARE: Admit to inpatient   PATIENT DISPOSITION:  PACU - hemodynamically stable.   Delay start of Pharmacological VTE agent (>24hrs) due to surgical blood loss or risk of bleeding: no

## 2020-07-29 NOTE — Anesthesia Postprocedure Evaluation (Signed)
Anesthesia Post Note  Patient: Duane Hall [facility]  Procedure(s) Performed: BONE BIOPSY (Left Foot)     Patient location during evaluation: PACU Anesthesia Type: MAC Level of consciousness: awake and alert Pain management: pain level controlled Vital Signs Assessment: post-procedure vital signs reviewed and stable Respiratory status: spontaneous breathing, nonlabored ventilation, respiratory function stable and patient connected to nasal cannula oxygen Cardiovascular status: stable and blood pressure returned to baseline Postop Assessment: no apparent nausea or vomiting Anesthetic complications: no   No complications documented.  Last Vitals:  Vitals:   07/29/20 1030 07/29/20 1054  BP:  (!) 116/54  Pulse:  80  Resp:  18  Temp: 36.9 C 37 C  SpO2:  97%    Last Pain:  Vitals:   07/29/20 1054  TempSrc: Oral  PainSc:                  Tiajuana Amass

## 2020-07-30 ENCOUNTER — Encounter (HOSPITAL_COMMUNITY): Payer: Self-pay | Admitting: Podiatry

## 2020-07-30 LAB — GLUCOSE, CAPILLARY
Glucose-Capillary: 331 mg/dL — ABNORMAL HIGH (ref 70–99)
Glucose-Capillary: 119 mg/dL — ABNORMAL HIGH (ref 70–99)
Glucose-Capillary: 276 mg/dL — ABNORMAL HIGH (ref 70–99)
Glucose-Capillary: 259 mg/dL — ABNORMAL HIGH (ref 70–99)

## 2020-07-30 MED ORDER — CHLORHEXIDINE GLUCONATE CLOTH 2 % EX PADS
6.0000 | MEDICATED_PAD | Freq: Every day | CUTANEOUS | Status: DC
  Administered 2020-07-30 – 2020-08-01 (×3): 6 via TOPICAL

## 2020-07-30 NOTE — Progress Notes (Signed)
PROGRESS NOTE  Duane Hall URK:[id number] DOB: Jun 29, 1956 DOA: 07/26/2020 PCP: Pieter Partridge, PA  HPI/Recap of past 24 hours: Patient admitted to the hospital with a working diagnosis of sepsis due to diabetic foot ulcer, end-organ failure hypotension.  64 year old male with significant past medical history for end-stage renal disease on hemodialysis who presents with confusion and low blood pressure. He was brought to the hospital due to worsening hypotension after hemodialysis. Reported worsening edema, erythema and odor at the wound site for several days. He had a recent admission for diabetic foot ulcer,related to peripheral vascular disease, s/pendarterectomy and balloon angioplasty to the left superior femoral artery.After administration of intravenous fluids blood pressure 131/58, pulse rate 82, respirate 17, temperature 98.7, oxygenation 99%. His lungs were clear to auscultation bilaterally, heart S1-S2, present rhythmic, soft abdomen, no lower extremity edema. Left foot with loca edema and large heal ulcer with necrotic scar, positive erythema and local tenderness along with increased local temperature. Sodium 132, potassium 2.9, chloride 91, bicarb 29, glucose 167, BUN 25, creatinine 4.39, lactic acid 1.1, white count 12.0, hemoglobin 9.8, hematocrit 29.5, platelets 158. SARS COVID-19 negative. Urinalysis 11-20 white cells, 11-20 red cells, specific gravity 1.020, >300 protein. Chest radiograph with hyperinflation, bibasilar atelectasis. EKG 90 bpm, normal axis, normal intervals, sinus rhythm, Q wave V1-V2, no ST segment or T wave changes, positive LVH.  Further work up with left foot MRI, positive for calcaneus bone osteomyelitis.  Patient underwent today, bone biopsy, depending on the results will approach next step.   07/30/20: No new complaints this AM.  Declines labs.  Assessment/Plan: Principal Problem:   Sepsis ([place]) Active Problems:   Type II diabetes  mellitus (HCC)   Hypertension   Hepatitis C   Hypokalemia   Vascular insufficiency of extremity   Diabetic foot ulcer (HCC)   ESRD (end stage renal disease) ([place])   Acute osteomyelitis of left calcaneus ([place])  1. Left footcalcaneus osteomyelitisin the setting of peripheral vascular disease, complicated with sepsis, end-organ failure hypotension (present on admission). Foot films with no bony lesions.Foot MRI with superficial ulceration overlying the posterior calcaneus, with diffuse subcutaneous inflammation, suggestive for osteomyelitis. Sepsis now has resolved.   Antibiotics her held for bone biopsy, now will resume IV vancomycin per pharmacy protocol. Will follow on bone culture/ biopsy.   Tolerating well asa, clopidogrel and statin therapy.  2. ESRD on HD MWF/ with hypokalemia/ hypomagnesemia/ hyponatremia. Patient gets home dialysis, per right upper extremity fistula. Will follow with nephrology for further inpatient treatment. Next HD tomorrow.   3. T2DM/ dyslipidemia. Fasting glucose this am167,continue withinsulin sliding scale for glucose cover and monitoring.  Continue withrosuvastatin   4. HTN.His EKG has signs of LVH,  Onmidodrine with good toleration.   Echocardiogram with preserved LV systolic function.   5. Anemia of chronic renal disease.  Stable cell count.   6. Chronic hypotension on Midodrine Continue to maintain MAP>65   Status is: Inpatient  Remains inpatient appropriate because:IV treatments appropriate due to intensity of illness or inability to take PO   Dispo: The patient is from: Home  Anticipated d/c is to: Home  Anticipated d/c date is: 08/01/20  Patient currently is not medically stable to d/c, ongoing treatment for osteomyelitis with IV antibiotics.   DVT prophylaxis:      Heparin  SQ TID Code Status:              full  Family Communication:  Wife at bedside.    Nutrition  Status:  Skin Documentation:    Consultants:   Podiatry   Procedures:   Left foot calcaneous bone biopsy   Antimicrobials:   IV vancomycin      Objective: Vitals:   07/29/20 2035 07/30/20 0243 07/30/20 0612 07/30/20 0954  BP: (!) 124/63 (!) 137/59 (!) 150/72 (!) 93/56  Pulse: 82 81 85 89  Resp: 17 18 18 18   Temp: 98.4 F (36.9 C) 98.1 F (36.7 C) 98.5 F (36.9 C) 98.3 F (36.8 C)  TempSrc: Oral Oral Oral Oral  SpO2: 100% 100% 100% 97%  Weight:      Height:        Intake/Output Summary (Last 24 hours) at 07/30/2020 1219 Last data filed at 07/30/2020 4259 Gross per 24 hour  Intake 60 ml  Output --  Net 60 ml   Filed Weights   07/26/20 2318 07/28/20 0430  Weight: 70.3 kg 72.2 kg    Exam:  . General: 64 y.o. year-old male well developed well nourished in no acute distress.  Alert and oriented x3. . Cardiovascular: Regular rate and rhythm with no rubs or gallops.  No thyromegaly or JVD noted.   Marland Kitchen Respiratory: Clear to auscultation with no wheezes or rales. Good inspiratory effort. . Abdomen: Soft nontender nondistended with normal bowel sounds x4 quadrants. . Musculoskeletal: Trace lower extremity edema bilaterally . Psychiatry: Mood is appropriate for condition and setting   Data Reviewed: CBC: Recent Labs  Lab 07/26/20 2345 07/27/20 0639 07/28/20 1605 07/28/20 2253 07/29/20 1752  WBC 12.0* 10.0 6.6 5.1 6.1  NEUTROABS 9.9*  --  4.6  --  4.2  HGB 9.8* 9.9* 9.9* 9.0* 9.7*  HCT 29.5* 29.1* 29.1* 26.8* 29.6*  MCV 92.5 93.6 91.5 92.1 92.8  PLT 158 142* 150 158 563   Basic Metabolic Panel: Recent Labs  Lab 07/26/20 2345 07/27/20 0639 07/28/20 2253 07/29/20 0330  NA 132* 131* 133* 137  K 2.9* 3.0* 3.3* 3.7  CL 91* 92* 95* 100  CO2 29 28 26 29   GLUCOSE 167* 151* 247* 167*  BUN 25* 30* 53* 17  CREATININE 4.39* 4.67* 6.67* 3.30*  CALCIUM 8.9 8.4* 8.4* 8.1*  MG  --  1.5*  --   --   PHOS  --   --  3.1  --    GFR: Estimated  Creatinine Clearance: 23.2 mL/min (A) (by C-G formula based on SCr of 3.3 mg/dL (H)). Liver Function Tests: Recent Labs  Lab 07/26/20 2345 07/28/20 2253  AST 53*  --   ALT 24  --   ALKPHOS 121  --   BILITOT 0.7  --   PROT 6.2*  --   ALBUMIN 3.0* 2.5*   No results for input(s): LIPASE, AMYLASE in the last 168 hours. No results for input(s): AMMONIA in the last 168 hours. Coagulation Profile: Recent Labs  Lab 07/26/20 2345  INR 1.2   Cardiac Enzymes: No results for input(s): CKTOTAL, CKMB, CKMBINDEX, TROPONINI in the last 168 hours. BNP (last 3 results) No results for input(s): PROBNP in the last 8760 hours. HbA1C: No results for input(s): HGBA1C in the last 72 hours. CBG: Recent Labs  Lab 07/29/20 1008 07/29/20 1705 07/29/20 2032 07/30/20 0727 07/30/20 1209  GLUCAP 179* 203* 238* 331* 119*   Lipid Profile: No results for input(s): CHOL, HDL, LDLCALC, TRIG, CHOLHDL, LDLDIRECT in the last 72 hours. Thyroid Function Tests: No results for input(s): TSH, T4TOTAL, FREET4, T3FREE, THYROIDAB in the last 72 hours. Anemia Panel: No results for input(s): VITAMINB12, FOLATE, FERRITIN,  TIBC, IRON, RETICCTPCT in the last 72 hours. Urine analysis:    Component Value Date/Time   COLORURINE AMBER (A) 07/27/2020 1050   APPEARANCEUR CLOUDY (A) 07/27/2020 1050   LABSPEC 1.020 07/27/2020 1050   PHURINE 5.0 07/27/2020 1050   GLUCOSEU 150 (A) 07/27/2020 1050   HGBUR LARGE (A) 07/27/2020 1050   BILIRUBINUR SMALL (A) 07/27/2020 1050   KETONESUR NEGATIVE 07/27/2020 1050   PROTEINUR >=300 (A) 07/27/2020 1050   NITRITE NEGATIVE 07/27/2020 1050   LEUKOCYTESUR NEGATIVE 07/27/2020 1050   Sepsis Labs: @LABRCNTIP (procalcitonin:4,lacticidven:4)  ) Recent Results (from the past 240 hour(s))  Blood Culture (routine x 2)     Status: None (Preliminary result)   Collection Time: 07/26/20 11:45 PM   Specimen: BLOOD  Result Value Ref Range Status   Specimen Description BLOOD LEFT HAND  Final    Special Requests   Final    BOTTLES DRAWN AEROBIC AND ANAEROBIC Blood Culture adequate volume   Culture   Final    NO GROWTH 3 DAYS Performed at [facility], [place] [address], [place], [place] [address]    Report Status PENDING  Incomplete  Blood Culture (routine x 2)     Status: None (Preliminary result)   Collection Time: 07/27/20 12:04 AM   Specimen: BLOOD  Result Value Ref Range Status   Specimen Description BLOOD LEFT ARM  Final   Special Requests   Final    BOTTLES DRAWN AEROBIC AND ANAEROBIC Blood Culture adequate volume   Culture   Final    NO GROWTH 3 DAYS Performed at [facility], [place] [address], [place], [place] [address]    Report Status PENDING  Incomplete  SARS Coronavirus 2 by RT PCR (hospital order, performed in [facility] hospital lab) Nasopharyngeal Nasopharyngeal Swab     Status: None   Collection Time: 07/27/20  2:59 AM   Specimen: Nasopharyngeal Swab  Result Value Ref Range Status   SARS Coronavirus 2 NEGATIVE NEGATIVE Final    Comment: (NOTE) SARS-CoV-2 target nucleic acids are NOT DETECTED.  The SARS-CoV-2 RNA is generally detectable in upper and lower respiratory specimens during the acute phase of infection. The lowest concentration of SARS-CoV-2 viral copies this assay can detect is 250 copies / mL. A negative result does not preclude SARS-CoV-2 infection and should not be used as the sole basis for treatment or other patient management decisions.  A negative result may occur with improper specimen collection / handling, submission of specimen other than nasopharyngeal swab, presence of viral mutation(s) within the areas targeted by this assay, and inadequate number of viral copies (<250 copies / mL). A negative result must be combined with clinical observations, patient history, and epidemiological information.  Fact Sheet for Patients:   StrictlyIdeas.no  Fact Sheet for Healthcare  Providers: BankingDealers.co.za  This test is not yet approved or  cleared by the [place] FDA and has been authorized for detection and/or diagnosis of SARS-CoV-2 by FDA under an Emergency Use Authorization (EUA).  This EUA will remain in effect (meaning this test can be used) for the duration of the COVID-19 declaration under Section 564(b)(1) of the Act, 21 U.S.C. section 360bbb-3(b)(1), unless the authorization is terminated or revoked sooner.  Performed at [facility], [place] [address] [place], [place] [address]   Urine culture     Status: Abnormal   Collection Time: 07/27/20 10:52 AM   Specimen: In/Out Cath Urine  Result Value Ref Range Status   Specimen Description IN/OUT  CATH URINE  Final   Special Requests   Final    NONE Performed at [facility], [place] [address], [place], [place] [address]    Culture 1,000 COLONIES/mL STAPHYLOCOCCUS EPIDERMIDIS (A)  Final   Report Status 07/29/2020 FINAL  Final  MRSA PCR Screening     Status: None   Collection Time: 07/27/20 10:19 PM   Specimen: Nasal Mucosa; Nasopharyngeal  Result Value Ref Range Status   MRSA by PCR NEGATIVE NEGATIVE Final    Comment:        The GeneXpert MRSA Assay (FDA approved for NASAL specimens only), is one component of a comprehensive MRSA colonization surveillance program. It is not intended to diagnose MRSA infection nor to guide or monitor treatment for MRSA infections. Performed at [facility], [place] [address], [place], [place] [address]       Studies: ECHOCARDIOGRAM COMPLETE  Result Date: 07/29/2020    ECHOCARDIOGRAM REPORT   Patient Name:   ETHAN CLAYBURN Date of Exam: 07/29/2020 Medical Rec #:  [id number]       Height:       69.0 in Accession #:    [id number]      Weight:       159.2 lb Date of Birth:  12/09/1956       BSA:          1.875 m Patient Age:    67 years        BP:           96/53 mmHg Patient Gender: M               HR:           81 bpm.  Exam Location:  Inpatient Procedure: 2D Echo Indications:    dyspnea 786.09  History:        Patient has no prior history of Echocardiogram examinations. End                 stage renal disease. Sepsis.; Risk Factors:Diabetes and Current                 Smoker.  Sonographer:    Johny Chess Referring Phys: [id number] [place]  1. Left ventricular ejection fraction, by estimation, is 55 to 60%. The left ventricle has normal function. The left ventricle has no regional wall motion abnormalities. Left ventricular diastolic parameters are indeterminate.  2. Right ventricular systolic function is normal. The right ventricular size is normal.  3. Left atrial size was severely dilated.  4. The mitral valve is normal in structure. Mild mitral valve regurgitation. No evidence of mitral stenosis.  5. The aortic valve is tricuspid. Aortic valve regurgitation is not visualized. No aortic stenosis is present.  6. The inferior vena cava is normal in size with greater than 50% respiratory variability, suggesting right atrial pressure of 3 mmHg. FINDINGS  Left Ventricle: Left ventricular ejection fraction, by estimation, is 55 to 60%. The left ventricle has normal function. The left ventricle has no regional wall motion abnormalities. The left ventricular internal cavity size was normal in size. There is  no left ventricular hypertrophy. Left ventricular diastolic parameters are indeterminate. Right Ventricle: The right ventricular size is normal. No increase in right ventricular wall thickness. Right ventricular systolic function is normal. Left Atrium: Left atrial size was severely dilated. Right Atrium: Right atrial size was normal in size. Pericardium: There is no evidence of pericardial effusion. Mitral Valve: The mitral valve  is normal in structure. Mild mitral valve regurgitation. No evidence of mitral valve stenosis. Tricuspid Valve: The tricuspid valve is normal in structure. Tricuspid valve  regurgitation is not demonstrated. No evidence of tricuspid stenosis. Aortic Valve: The aortic valve is tricuspid. Aortic valve regurgitation is not visualized. No aortic stenosis is present. Aortic valve mean gradient measures 4.3 mmHg. Aortic valve peak gradient measures 8.1 mmHg. Aortic valve area, by VTI measures 3.07 cm. Pulmonic Valve: The pulmonic valve was not well visualized. Pulmonic valve regurgitation is not visualized. No evidence of pulmonic stenosis. Aorta: The aortic root is normal in size and structure. Pulmonary Artery: Indeterminant PASP, inadequate TR jet. Venous: The inferior vena cava is normal in size with greater than 50% respiratory variability, suggesting right atrial pressure of 3 mmHg. IAS/Shunts: No atrial level shunt detected by color flow Doppler.  LEFT VENTRICLE PLAX 2D LVIDd:         5.70 cm  Diastology LVIDs:         3.70 cm  LV e' lateral:   9.68 cm/s LV PW:         1.00 cm  LV E/e' lateral: 11.2 LV IVS:        0.90 cm  LV e' medial:    8.59 cm/s LVOT diam:     2.30 cm  LV E/e' medial:  12.6 LV SV:         94 LV SV Index:   50 LVOT Area:     4.15 cm  RIGHT VENTRICLE             IVC RV S prime:     12.00 cm/s  IVC diam: 1.70 cm TAPSE (M-mode): 2.4 cm LEFT ATRIUM             Index       RIGHT ATRIUM           Index LA diam:        4.20 cm 2.24 cm/m  RA Area:     15.80 cm LA Vol (A2C):   84.8 ml 45.23 ml/m RA Volume:   42.00 ml  22.40 ml/m LA Vol (A4C):   76.4 ml 40.75 ml/m LA Biplane Vol: 84.8 ml 45.23 ml/m  AORTIC VALVE AV Area (Vmax):    3.06 cm AV Area (Vmean):   2.77 cm AV Area (VTI):     3.07 cm AV Vmax:           142.01 cm/s AV Vmean:          98.806 cm/s AV VTI:            0.306 m AV Peak Grad:      8.1 mmHg AV Mean Grad:      4.3 mmHg LVOT Vmax:         104.64 cm/s LVOT Vmean:        65.900 cm/s LVOT VTI:          0.226 m LVOT/AV VTI ratio: 0.74  AORTA Ao Root diam: 3.70 cm Ao Asc diam:  3.20 cm MITRAL VALVE MV Area (PHT): 3.91 cm      SHUNTS MV Decel Time: 194  msec      Systemic VTI:  0.23 m MR Peak grad:    99.2 mmHg   Systemic Diam: 2.30 cm MR Mean grad:    59.0 mmHg MR Vmax:         498.00 cm/s MR Vmean:        358.0 cm/s MR PISA:  0.57 cm MR PISA Eff ROA: 4 mm MR PISA Radius:  0.30 cm MV E velocity: 108.00 cm/s MV A velocity: 111.00 cm/s MV E/A ratio:  0.97 Carlyle Dolly MD Electronically signed by Carlyle Dolly MD Signature Date/Time: 07/29/2020/2:58:44 PM    Final     Scheduled Meds: . aspirin EC  81 mg Oral Daily  . Chlorhexidine Gluconate Cloth  6 each Topical Q0600  . clopidogrel  75 mg Oral Q breakfast  . darbepoetin (ARANESP) injection - DIALYSIS  60 mcg Intravenous Q Fri-HD  . ferric citrate  210 mg Oral TID WC  . gabapentin  300 mg Oral Daily  . heparin  5,000 Units Subcutaneous Q8H  . insulin aspart  0-15 Units Subcutaneous TID WC  . insulin aspart  0-5 Units Subcutaneous QHS  . magnesium oxide  400 mg Oral TID  . multivitamin  1 tablet Oral Daily  . nicotine  14 mg Transdermal Daily  . omeprazole  40 mg Oral Q0600  . rosuvastatin  10 mg Oral Daily    Continuous Infusions: . [START ON 07/31/2020] vancomycin       LOS: 3 days     Kayleen Memos, MD [facility] Pager [phone]  If 7PM-7AM, please contact night-coverage [email or web address] Password [facility] 07/30/2020, 12:19 PM

## 2020-07-30 NOTE — Progress Notes (Signed)
[facility] Progress Note   Dialysis Orders: [facility] - NxStage  3hrs, BFR 400, DFR 800, EDW 70kg , 2K  Access: R. Upper AV fistula  Heparin 3000 Midodrine 10mg  qHD  Assessment/Plan: 1. Sepsis likely d/t diabetic foot ulcer and vascular insufficiency of extremity - BC sent and pending- negative to date. Wound care following.  IV Vanc per pharm. Left heel  MRI concerning for osteo - s/p bone bx 7/31-  urine culture appears negative as well  2. ESRD -  Nx stage pt - MWF here - K better 3.7 pre HD Friday - 4 K bath used - next HD tomorrow/ Monday if still here 3. Hypotension/volume  - On midodrine with HD for BP support.BP limiting UF net UF 594 ml Friday - lowest BP 74/44 - no wts- NEEDS dialysis weights next tmt.  He says that midodrine "doesn't work" and gives him urinary urgency-  Does not want to continue-  I gave him permission to stop  4. Anemia of CKD - Hgb 9.9>9, now 9.7   No recent ESA dose noted in chart.  Started Araneso 60 7/30 (q Friday) 5. Secondary Hyperparathyroidism -   Continue Auryxia. Ca/P ok 6. Nutrition - Albumin low.  Continue renal/carb modified diet w/ fluid restirction. Wife bringing in breakfast  7. Tobacco abuse - Started on nicotine patch per primary. 8. COPD - stable, home meds resumed per primary.  9. Diabetes mellitus - sliding scale insulin ordered, monitored by primary.    Duane Hall  07/30/2020,7:51 AM  LOS: 3 days   Subjective:  Afebrile last 24 hours - very talkative- having customer service c/o about hospital- is hoping he will go home today-  Feet feel like "club feet"  No edema-    Objective Vitals:   07/29/20 1524 07/29/20 2035 07/30/20 0243 07/30/20 0612  BP: (!) 117/59 (!) 124/63 (!) 137/59 (!) 150/72  Pulse: 79 82 81 85  Resp: 16 17 18 18   Temp: 98.5 F (36.9 C) 98.4 F (36.9 C) 98.1 F (36.7 C) 98.5 F (36.9 C)  TempSrc: Oral Oral Oral Oral  SpO2: 99% 100% 100% 100%  Weight:      Height:       Physical  Exam General:chronically ill appearing male breathing easily supine in bed Heart:  RRR Lungs:no rales Abdomen: soft Extremities: no sig LE edema; dressings on bilateral heels, SCDs in place Dialysis Access: right upper AVF + bruit   Additional Objective Labs: Basic Metabolic Panel: Recent Labs  Lab 07/27/20 0639 07/28/20 2253 07/29/20 0330  NA 131* 133* 137  K 3.0* 3.3* 3.7  CL 92* 95* 100  CO2 28 26 29   GLUCOSE 151* 247* 167*  BUN 30* 53* 17  CREATININE 4.67* 6.67* 3.30*  CALCIUM 8.4* 8.4* 8.1*  PHOS  --  3.1  --    Liver Function Tests: Recent Labs  Lab 07/26/20 2345 07/28/20 2253  AST 53*  --   ALT 24  --   ALKPHOS 121  --   BILITOT 0.7  --   PROT 6.2*  --   ALBUMIN 3.0* 2.5*   No results for input(s): LIPASE, AMYLASE in the last 168 hours. CBC: Recent Labs  Lab 07/26/20 2345 07/26/20 2345 07/27/20 0639 07/27/20 0639 07/28/20 1605 07/28/20 2253 07/29/20 1752  WBC 12.0*   < > 10.0   < > 6.6 5.1 6.1  NEUTROABS 9.9*  --   --   --  4.6  --  4.2  HGB 9.8*   < >  9.9*   < > 9.9* 9.0* 9.7*  HCT 29.5*   < > 29.1*   < > 29.1* 26.8* 29.6*  MCV 92.5  --  93.6  --  91.5 92.1 92.8  PLT 158   < > 142*   < > 150 158 157   < > = values in this interval not displayed.   Blood Culture    Component Value Date/Time   SDES IN/OUT CATH URINE 07/27/2020 1052   SPECREQUEST  07/27/2020 1052    NONE Performed at [facility] [address], [place], [place] [address]    CULT 1,000 COLONIES/mL STAPHYLOCOCCUS EPIDERMIDIS (A) 07/27/2020 1052   REPTSTATUS 07/29/2020 FINAL 07/27/2020 1052    Cardiac Enzymes: No results for input(s): CKTOTAL, CKMB, CKMBINDEX, TROPONINI in the last 168 hours. CBG: Recent Labs  Lab 07/29/20 0851 07/29/20 1008 07/29/20 1705 07/29/20 2032 07/30/20 0727  GLUCAP 210* 179* 203* 238* 331*   Iron Studies: No results for input(s): IRON, TIBC, TRANSFERRIN, FERRITIN in the last 72 hours. Lab Results  Component Value Date   INR 1.2  07/26/2020   INR 1.1 09/14/2019   INR 1.1 09/10/2019   Studies/Results: ECHOCARDIOGRAM COMPLETE  Result Date: 07/29/2020    ECHOCARDIOGRAM REPORT   Patient Name:   Duane Hall Date of Exam: 07/29/2020 Medical Rec #:  [id number]       Height:       69.0 in Accession #:    [id number]      Weight:       159.2 lb Date of Birth:  [birth year]       BSA:          1.875 m Patient Age:    64 years        BP:           96/53 mmHg Patient Gender: M               HR:           81 bpm. Exam Location:  Inpatient Procedure: 2D Echo Indications:    dyspnea 786.09  History:        Patient has no prior history of Echocardiogram examinations. End                 stage renal disease. Sepsis.; Risk Factors:Diabetes and Current                 Smoker.  Sonographer:    Johny Chess Referring Phys: [id number] [place]  1. Left ventricular ejection fraction, by estimation, is 55 to 60%. The left ventricle has normal function. The left ventricle has no regional wall motion abnormalities. Left ventricular diastolic parameters are indeterminate.  2. Right ventricular systolic function is normal. The right ventricular size is normal.  3. Left atrial size was severely dilated.  4. The mitral valve is normal in structure. Mild mitral valve regurgitation. No evidence of mitral stenosis.  5. The aortic valve is tricuspid. Aortic valve regurgitation is not visualized. No aortic stenosis is present.  6. The inferior vena cava is normal in size with greater than 50% respiratory variability, suggesting right atrial pressure of 3 mmHg. FINDINGS  Left Ventricle: Left ventricular ejection fraction, by estimation, is 55 to 60%. The left ventricle has normal function. The left ventricle has no regional wall motion abnormalities. The left ventricular internal cavity size was normal in size. There is  no left ventricular hypertrophy. Left ventricular diastolic parameters are indeterminate. Right  Ventricle: The right  ventricular size is normal. No increase in right ventricular wall thickness. Right ventricular systolic function is normal. Left Atrium: Left atrial size was severely dilated. Right Atrium: Right atrial size was normal in size. Pericardium: There is no evidence of pericardial effusion. Mitral Valve: The mitral valve is normal in structure. Mild mitral valve regurgitation. No evidence of mitral valve stenosis. Tricuspid Valve: The tricuspid valve is normal in structure. Tricuspid valve regurgitation is not demonstrated. No evidence of tricuspid stenosis. Aortic Valve: The aortic valve is tricuspid. Aortic valve regurgitation is not visualized. No aortic stenosis is present. Aortic valve mean gradient measures 4.3 mmHg. Aortic valve peak gradient measures 8.1 mmHg. Aortic valve area, by VTI measures 3.07 cm. Pulmonic Valve: The pulmonic valve was not well visualized. Pulmonic valve regurgitation is not visualized. No evidence of pulmonic stenosis. Aorta: The aortic root is normal in size and structure. Pulmonary Artery: Indeterminant PASP, inadequate TR jet. Venous: The inferior vena cava is normal in size with greater than 50% respiratory variability, suggesting right atrial pressure of 3 mmHg. IAS/Shunts: No atrial level shunt detected by color flow Doppler.  LEFT VENTRICLE PLAX 2D LVIDd:         5.70 cm  Diastology LVIDs:         3.70 cm  LV e' lateral:   9.68 cm/s LV PW:         1.00 cm  LV E/e' lateral: 11.2 LV IVS:        0.90 cm  LV e' medial:    8.59 cm/s LVOT diam:     2.30 cm  LV E/e' medial:  12.6 LV SV:         94 LV SV Index:   50 LVOT Area:     4.15 cm  RIGHT VENTRICLE             IVC RV S prime:     12.00 cm/s  IVC diam: 1.70 cm TAPSE (M-mode): 2.4 cm LEFT ATRIUM             Index       RIGHT ATRIUM           Index LA diam:        4.20 cm 2.24 cm/m  RA Area:     15.80 cm LA Vol (A2C):   84.8 ml 45.23 ml/m RA Volume:   42.00 ml  22.40 ml/m LA Vol (A4C):   76.4 ml 40.75 ml/m LA Biplane Vol: 84.8 ml  45.23 ml/m  AORTIC VALVE AV Area (Vmax):    3.06 cm AV Area (Vmean):   2.77 cm AV Area (VTI):     3.07 cm AV Vmax:           142.01 cm/s AV Vmean:          98.806 cm/s AV VTI:            0.306 m AV Peak Grad:      8.1 mmHg AV Mean Grad:      4.3 mmHg LVOT Vmax:         104.64 cm/s LVOT Vmean:        65.900 cm/s LVOT VTI:          0.226 m LVOT/AV VTI ratio: 0.74  AORTA Ao Root diam: 3.70 cm Ao Asc diam:  3.20 cm MITRAL VALVE MV Area (PHT): 3.91 cm      SHUNTS MV Decel Time: 194 msec      Systemic VTI:  0.23 m MR Peak grad:    99.2 mmHg   Systemic Diam: 2.30 cm MR Mean grad:    59.0 mmHg MR Vmax:         498.00 cm/s MR Vmean:        358.0 cm/s MR PISA:         0.57 cm MR PISA Eff ROA: 4 mm MR PISA Radius:  0.30 cm MV E velocity: 108.00 cm/s MV A velocity: 111.00 cm/s MV E/A ratio:  0.97 Carlyle Dolly MD Electronically signed by Carlyle Dolly MD Signature Date/Time: 07/29/2020/2:58:44 PM    Final    Medications: . [START ON 07/31/2020] vancomycin     . aspirin EC  81 mg Oral Daily  . clopidogrel  75 mg Oral Q breakfast  . darbepoetin (ARANESP) injection - DIALYSIS  60 mcg Intravenous Q Fri-HD  . ferric citrate  210 mg Oral TID WC  . gabapentin  300 mg Oral Daily  . heparin  5,000 Units Subcutaneous Q8H  . insulin aspart  0-15 Units Subcutaneous TID WC  . insulin aspart  0-5 Units Subcutaneous QHS  . magnesium oxide  400 mg Oral TID  . midodrine  10 mg Oral Once per day on Sun Tue Thu Sat  . multivitamin  1 tablet Oral Daily  . nicotine  14 mg Transdermal Daily  . omeprazole  40 mg Oral Q0600  . rosuvastatin  10 mg Oral Daily

## 2020-07-30 NOTE — Progress Notes (Signed)
Patient refusing blood draw, MD made aware.

## 2020-07-31 LAB — HEPATITIS B SURFACE ANTIGEN: Hepatitis B Surface Ag: NONREACTIVE

## 2020-07-31 LAB — GLUCOSE, CAPILLARY
Glucose-Capillary: 216 mg/dL — ABNORMAL HIGH (ref 70–99)
Glucose-Capillary: 231 mg/dL — ABNORMAL HIGH (ref 70–99)
Glucose-Capillary: 274 mg/dL — ABNORMAL HIGH (ref 70–99)

## 2020-07-31 LAB — CBC WITH DIFFERENTIAL/PLATELET
Abs Immature Granulocytes: 0.17 10*3/uL — ABNORMAL HIGH (ref 0.00–0.07)
Basophils Absolute: 0.1 10*3/uL (ref 0.0–0.1)
Basophils Relative: 1 %
Eosinophils Absolute: 0.2 10*3/uL (ref 0.0–0.5)
Eosinophils Relative: 4 %
HCT: 27.9 % — ABNORMAL LOW (ref 39.0–52.0)
Hemoglobin: 9.1 g/dL — ABNORMAL LOW (ref 13.0–17.0)
Immature Granulocytes: 3 %
Lymphocytes Relative: 20 %
Lymphs Abs: 1.3 10*3/uL (ref 0.7–4.0)
MCH: 31.3 pg (ref 26.0–34.0)
MCHC: 32.6 g/dL (ref 30.0–36.0)
MCV: 95.9 fL (ref 80.0–100.0)
Monocytes Absolute: 0.5 10*3/uL (ref 0.1–1.0)
Monocytes Relative: 8 %
Neutro Abs: 4.4 10*3/uL (ref 1.7–7.7)
Neutrophils Relative %: 64 %
Platelets: 184 10*3/uL (ref 150–400)
RBC: 2.91 MIL/uL — ABNORMAL LOW (ref 4.22–5.81)
RDW: 13.5 % (ref 11.5–15.5)
WBC: 6.7 10*3/uL (ref 4.0–10.5)
nRBC: 0 % (ref 0.0–0.2)

## 2020-07-31 LAB — COMPREHENSIVE METABOLIC PANEL
ALT: 43 U/L (ref 0–44)
AST: 42 U/L — ABNORMAL HIGH (ref 15–41)
Albumin: 2.6 g/dL — ABNORMAL LOW (ref 3.5–5.0)
Alkaline Phosphatase: 115 U/L (ref 38–126)
Anion gap: 9 (ref 5–15)
BUN: 44 mg/dL — ABNORMAL HIGH (ref 8–23)
CO2: 27 mmol/L (ref 22–32)
Calcium: 8.7 mg/dL — ABNORMAL LOW (ref 8.9–10.3)
Chloride: 100 mmol/L (ref 98–111)
Creatinine, Ser: 6.96 mg/dL — ABNORMAL HIGH (ref 0.61–1.24)
GFR calc Af Amer: 9 mL/min — ABNORMAL LOW (ref >60)
GFR calc non Af Amer: 8 mL/min — ABNORMAL LOW (ref >60)
Glucose, Bld: 155 mg/dL — ABNORMAL HIGH (ref 70–99)
Potassium: 4.2 mmol/L (ref 3.5–5.1)
Sodium: 136 mmol/L (ref 135–145)
Total Bilirubin: 0.7 mg/dL (ref 0.3–1.2)
Total Protein: 5.6 g/dL — ABNORMAL LOW (ref 6.5–8.1)

## 2020-07-31 MED ORDER — SODIUM CHLORIDE 0.9 % IV SOLN
1.0000 g | INTRAVENOUS | Status: DC
  Administered 2020-07-31: 1 g via INTRAVENOUS
  Filled 2020-07-31 (×2): qty 1

## 2020-07-31 MED ORDER — VANCOMYCIN HCL IN DEXTROSE 750-5 MG/150ML-% IV SOLN
INTRAVENOUS | Status: AC
  Administered 2020-07-31: 750 mg
  Filled 2020-07-31: qty 150

## 2020-07-31 NOTE — Progress Notes (Addendum)
PROGRESS NOTE  Duane Hall GMW:[id number] DOB: 27-Jun-1956 DOA: 07/26/2020 PCP: Pieter Partridge, PA  HPI/Recap of past 24 hours: Patient admitted to the hospital with a working diagnosis of sepsis due to diabetic foot ulcer, end-organ failure hypotension.  64 year old male with significant past medical history for end-stage renal disease on hemodialysis who presents with confusion and low blood pressure. He was brought to the hospital due to worsening hypotension after hemodialysis. Reported worsening edema, erythema and odor at the wound site for several days. He had a recent admission for diabetic foot ulcer,related to peripheral vascular disease, s/pendarterectomy and balloon angioplasty to the left superior femoral artery.After administration of intravenous fluids blood pressure 131/58, pulse rate 82, respirate 17, temperature 98.7, oxygenation 99%. His lungs were clear to auscultation bilaterally, heart S1-S2, present rhythmic, soft abdomen, no lower extremity edema. Left foot with loca edema and large heal ulcer with necrotic scar, positive erythema and local tenderness along with increased local temperature. Sodium 132, potassium 2.9, chloride 91, bicarb 29, glucose 167, BUN 25, creatinine 4.39, lactic acid 1.1, white count 12.0, hemoglobin 9.8, hematocrit 29.5, platelets 158. SARS COVID-19 negative. Urinalysis 11-20 white cells, 11-20 red cells, specific gravity 1.020, >300 protein. Chest radiograph with hyperinflation, bibasilar atelectasis. EKG 90 bpm, normal axis, normal intervals, sinus rhythm, Q wave V1-V2, no ST segment or T wave changes, positive LVH.  Further work up with left foot MRI, positive for calcaneus bone osteomyelitis.  Patient underwent today, bone biopsy, depending on the results will approach next step.   07/31/20: No new complaints this AM.  Added cefepime to IV vancomycin.  Assessment/Plan: Principal Problem:   Sepsis ([place]) Active Problems:    Type II diabetes mellitus (HCC)   Hypertension   Hepatitis C   Hypokalemia   Vascular insufficiency of extremity   Diabetic foot ulcer (HCC)   ESRD (end stage renal disease) ([name])   Acute osteomyelitis of left calcaneus (HCC)  Left footcalcaneus osteomyelitisin the setting of peripheral vascular disease, complicated with sepsis, end-organ failure hypotension (present on admission). Foot films with no bony lesions.Foot MRI with superficial ulceration overlying the posterior calcaneus, with diffuse subcutaneous inflammation, suggestive for osteomyelitis. Sepsis now has resolved.  Continue IV vancomycin Added cefepime on 02/29/2020  Continue to follow deep wound/bone cultures Podiatry following, Dr. Sherryle Lis, appreciate recommendations.  Peripheral vascular disease Continue asa, clopidogrel and statin therapy.  ESRD on HD MWF/ with resolved hypokalemia/ hypomagnesemia/ hyponatremia. Patient gets home dialysis, per right upper extremity fistula. Will follow with nephrology for further inpatient treatment. HD today  T2DM/with hyperglycemia.  Continue insulin sliding scale.  Hemoglobin A1c 7.2 on 07/27/2020.  HTN.  BP stable, continue to monitor  Anemia of chronic renal disease.    Hemoglobin uptrending, 9.7.  Resolved chronic hypotension off Midodrine BP stable   Status is: Inpatient  Remains inpatient appropriate because:IV treatments appropriate due to intensity of illness or inability to take PO   Dispo: The patient is from: Home  Anticipated d/c is to: Home  Anticipated d/c date is: 08/01/20  Patient currently is not medically stable to d/c, ongoing treatment for osteomyelitis with IV antibiotics.   DVT prophylaxis:      Heparin  SQ TID Code Status:              full  Family Communication:   None at bedside.    Nutrition Status:     Skin Documentation:    Consultants:   Podiatry   Procedures:   Left  foot calcaneous  bone biopsy   Antimicrobials:   IV vancomycin    IV cefepime, 07/31/20    Objective: Vitals:   07/30/20 0954 07/30/20 2200 07/31/20 0640 07/31/20 1354  BP: (!) 93/56 (!) 133/57 (!) 139/66 120/86  Pulse: 89 84 82 89  Resp: 18 16 17 17   Temp: 98.3 F (36.8 C) 97.9 F (36.6 C) 98.5 F (36.9 C) 98.7 F (37.1 C)  TempSrc: Oral Oral Oral Oral  SpO2: 97% 100% 99% 100%  Weight:      Height:        Intake/Output Summary (Last 24 hours) at 07/31/2020 1812 Last data filed at 07/31/2020 0846 Gross per 24 hour  Intake 340 ml  Output 225 ml  Net 115 ml   Filed Weights   07/26/20 2318 07/28/20 0430  Weight: 70.3 kg 72.2 kg    Exam:  . General: 64 y.o. year-old male well-developed well-nourished in no acute distress.  Alert and oriented x3.   . Cardiovascular: Regular rate and rhythm no rubs or gallops.   Marland Kitchen Respiratory: Clear to auscultation no wheezes or rales.   . Abdomen: Soft nontender normal bowel sounds present.   . Musculoskeletal: Lower extremity edema bilaterally, left greater than right.  . Psychiatry: Mood is appropriate for condition and setting.  Data Reviewed: CBC: Recent Labs  Lab 07/26/20 2345 07/27/20 0639 07/28/20 1605 07/28/20 2253 07/29/20 1752  WBC 12.0* 10.0 6.6 5.1 6.1  NEUTROABS 9.9*  --  4.6  --  4.2  HGB 9.8* 9.9* 9.9* 9.0* 9.7*  HCT 29.5* 29.1* 29.1* 26.8* 29.6*  MCV 92.5 93.6 91.5 92.1 92.8  PLT 158 142* 150 158 751   Basic Metabolic Panel: Recent Labs  Lab 07/26/20 2345 07/27/20 0639 07/28/20 2253 07/29/20 0330  NA 132* 131* 133* 137  K 2.9* 3.0* 3.3* 3.7  CL 91* 92* 95* 100  CO2 29 28 26 29   GLUCOSE 167* 151* 247* 167*  BUN 25* 30* 53* 17  CREATININE 4.39* 4.67* 6.67* 3.30*  CALCIUM 8.9 8.4* 8.4* 8.1*  MG  --  1.5*  --   --   PHOS  --   --  3.1  --    GFR: Estimated Creatinine Clearance: 23.2 mL/min (A) (by C-G formula based on SCr of 3.3 mg/dL (H)). Liver Function Tests: Recent Labs  Lab 07/26/20 2345  07/28/20 2253  AST 53*  --   ALT 24  --   ALKPHOS 121  --   BILITOT 0.7  --   PROT 6.2*  --   ALBUMIN 3.0* 2.5*   No results for input(s): LIPASE, AMYLASE in the last 168 hours. No results for input(s): AMMONIA in the last 168 hours. Coagulation Profile: Recent Labs  Lab 07/26/20 2345  INR 1.2   Cardiac Enzymes: No results for input(s): CKTOTAL, CKMB, CKMBINDEX, TROPONINI in the last 168 hours. BNP (last 3 results) No results for input(s): PROBNP in the last 8760 hours. HbA1C: No results for input(s): HGBA1C in the last 72 hours. CBG: Recent Labs  Lab 07/30/20 1632 07/30/20 2205 07/31/20 0802 07/31/20 1142 07/31/20 1704  GLUCAP 276* 259* 216* 231* 274*   Lipid Profile: No results for input(s): CHOL, HDL, LDLCALC, TRIG, CHOLHDL, LDLDIRECT in the last 72 hours. Thyroid Function Tests: No results for input(s): TSH, T4TOTAL, FREET4, T3FREE, THYROIDAB in the last 72 hours. Anemia Panel: No results for input(s): VITAMINB12, FOLATE, FERRITIN, TIBC, IRON, RETICCTPCT in the last 72 hours. Urine analysis:    Component Value Date/Time   COLORURINE AMBER (  A) 07/27/2020 1050   APPEARANCEUR CLOUDY (A) 07/27/2020 1050   LABSPEC 1.020 07/27/2020 1050   PHURINE 5.0 07/27/2020 1050   GLUCOSEU 150 (A) 07/27/2020 1050   HGBUR LARGE (A) 07/27/2020 1050   BILIRUBINUR SMALL (A) 07/27/2020 1050   KETONESUR NEGATIVE 07/27/2020 1050   PROTEINUR >=300 (A) 07/27/2020 1050   NITRITE NEGATIVE 07/27/2020 1050   LEUKOCYTESUR NEGATIVE 07/27/2020 1050   Sepsis Labs: @LABRCNTIP (procalcitonin:4,lacticidven:4)  ) Recent Results (from the past 240 hour(s))  Blood Culture (routine x 2)     Status: None (Preliminary result)   Collection Time: 07/26/20 11:45 PM   Specimen: BLOOD  Result Value Ref Range Status   Specimen Description BLOOD LEFT HAND  Final   Special Requests   Final    BOTTLES DRAWN AEROBIC AND ANAEROBIC Blood Culture adequate volume   Culture   Final    NO GROWTH 4  DAYS Performed at [facility], [place] [address] [place], [place] [address]    Report Status PENDING  Incomplete  Blood Culture (routine x 2)     Status: None (Preliminary result)   Collection Time: 07/27/20 12:04 AM   Specimen: BLOOD  Result Value Ref Range Status   Specimen Description BLOOD LEFT ARM  Final   Special Requests   Final    BOTTLES DRAWN AEROBIC AND ANAEROBIC Blood Culture adequate volume   Culture   Final    NO GROWTH 4 DAYS Performed at [facility], [place] [address], [place], [place] [address]    Report Status PENDING  Incomplete  SARS Coronavirus 2 by RT PCR (hospital order, performed in [facility] hospital lab) Nasopharyngeal Nasopharyngeal Swab     Status: None   Collection Time: 07/27/20  2:59 AM   Specimen: Nasopharyngeal Swab  Result Value Ref Range Status   SARS Coronavirus 2 NEGATIVE NEGATIVE Final    Comment: (NOTE) SARS-CoV-2 target nucleic acids are NOT DETECTED.  The SARS-CoV-2 RNA is generally detectable in upper and lower respiratory specimens during the acute phase of infection. The lowest concentration of SARS-CoV-2 viral copies this assay can detect is 250 copies / mL. A negative result does not preclude SARS-CoV-2 infection and should not be used as the sole basis for treatment or other patient management decisions.  A negative result may occur with improper specimen collection / handling, submission of specimen other than nasopharyngeal swab, presence of viral mutation(s) within the areas targeted by this assay, and inadequate number of viral copies (<250 copies / mL). A negative result must be combined with clinical observations, patient history, and epidemiological information.  Fact Sheet for Patients:   StrictlyIdeas.no  Fact Sheet for Healthcare Providers: BankingDealers.co.za  This test is not yet approved or  cleared by the [place] FDA and has been authorized for  detection and/or diagnosis of SARS-CoV-2 by FDA under an Emergency Use Authorization (EUA).  This EUA will remain in effect (meaning this test can be used) for the duration of the COVID-19 declaration under Section 564(b)(1) of the Act, 21 U.S.C. section 360bbb-3(b)(1), unless the authorization is terminated or revoked sooner.  Performed at [facility], [place] [address], [place], [place] [address]   Urine culture     Status: Abnormal   Collection Time: 07/27/20 10:52 AM   Specimen: In/Out Cath Urine  Result Value Ref Range Status   Specimen Description IN/OUT CATH URINE  Final   Special Requests   Final    NONE Performed at [facility]  Lab, [address], [place], [place] [address]    Culture 1,000 COLONIES/mL STAPHYLOCOCCUS EPIDERMIDIS (A)  Final   Report Status 07/29/2020 FINAL  Final  MRSA PCR Screening     Status: None   Collection Time: 07/27/20 10:19 PM   Specimen: Nasal Mucosa; Nasopharyngeal  Result Value Ref Range Status   MRSA by PCR NEGATIVE NEGATIVE Final    Comment:        The GeneXpert MRSA Assay (FDA approved for NASAL specimens only), is one component of a comprehensive MRSA colonization surveillance program. It is not intended to diagnose MRSA infection nor to guide or monitor treatment for MRSA infections. Performed at [facility], [place] [address], [place], [place] [address]   Aerobic/Anaerobic Culture (surgical/deep wound)     Status: None (Preliminary result)   Collection Time: 07/29/20  9:53 AM   Specimen: Bone; Tissue  Result Value Ref Range Status   Specimen Description BONE  Final   Special Requests LEFT CALCANEAL BONE SPEC A  Final   Gram Stain   Final    NO WBC SEEN NO ORGANISMS SEEN Performed at [facility], [place] [address], [place], [place] [address]    Culture PENDING  Incomplete   Report Status PENDING  Incomplete      Studies: No results found.  Scheduled Meds: . aspirin EC  81 mg Oral Daily  .  Chlorhexidine Gluconate Cloth  6 each Topical Q0600  . clopidogrel  75 mg Oral Q breakfast  . darbepoetin (ARANESP) injection - DIALYSIS  60 mcg Intravenous Q Fri-HD  . ferric citrate  210 mg Oral TID WC  . gabapentin  300 mg Oral Daily  . heparin  5,000 Units Subcutaneous Q8H  . insulin aspart  0-15 Units Subcutaneous TID WC  . insulin aspart  0-5 Units Subcutaneous QHS  . magnesium oxide  400 mg Oral TID  . multivitamin  1 tablet Oral Daily  . nicotine  14 mg Transdermal Daily  . omeprazole  40 mg Oral Q0600  . rosuvastatin  10 mg Oral Daily    Continuous Infusions: . ceFEPime (MAXIPIME) IV    . vancomycin       LOS: 4 days     Kayleen Memos, MD [facility] Pager [phone]  If 7PM-7AM, please contact night-coverage [email or web address] Password [facility] 07/31/2020, 6:12 PM

## 2020-07-31 NOTE — Plan of Care (Signed)
  Problem: Education: Goal: Knowledge of General Education information will improve Description Including pain rating scale, medication(s)/side effects and non-pharmacologic comfort measures Outcome: Progressing   

## 2020-07-31 NOTE — Progress Notes (Addendum)
[facility] Progress Note   Subjective:   Patient seen and examined at bedside.  SItting on side of the bed finishing breakfast. Requesting to have blood drawn at dialysis only because of difficultly getting stuck.  Otherwise he is doing ok.  Denies CP, SOB, n/v/d, fever, chills, weakness, dizziness and fatigue.   Objective Vitals:   07/30/20 0612 07/30/20 0954 07/30/20 2200 07/31/20 0640  BP: (!) 150/72 (!) 93/56 (!) 133/57 (!) 139/66  Pulse: 85 89 84 82  Resp: 18 18 16 17   Temp: 98.5 F (36.9 C) 98.3 F (36.8 C) 97.9 F (36.6 C) 98.5 F (36.9 C)  TempSrc: Oral Oral Oral Oral  SpO2: 100% 97% 100% 99%  Weight:      Height:       Physical Exam General:chronically ill appearing, pale, male in NAD sitting on the side of the bed Heart:RRR Lungs:CTAB, nml WOB Abdomen:soft, NTND Extremities:no LE edema, b/l feet/heels wrapped Dialysis Access: RU AVF +b   Filed Weights   07/26/20 2318 07/28/20 0430  Weight: 70.3 kg 72.2 kg    Intake/Output Summary (Last 24 hours) at 07/31/2020 0919 Last data filed at 07/31/2020 0846 Gross per 24 hour  Intake 580 ml  Output 225 ml  Net 355 ml    Additional Objective Labs: Basic Metabolic Panel: Recent Labs  Lab 07/27/20 0639 07/28/20 2253 07/29/20 0330  NA 131* 133* 137  K 3.0* 3.3* 3.7  CL 92* 95* 100  CO2 28 26 29   GLUCOSE 151* 247* 167*  BUN 30* 53* 17  CREATININE 4.67* 6.67* 3.30*  CALCIUM 8.4* 8.4* 8.1*  PHOS  --  3.1  --    Liver Function Tests: Recent Labs  Lab 07/26/20 2345 07/28/20 2253  AST 53*  --   ALT 24  --   ALKPHOS 121  --   BILITOT 0.7  --   PROT 6.2*  --   ALBUMIN 3.0* 2.5*   CBC: Recent Labs  Lab 07/26/20 2345 07/26/20 2345 07/27/20 0639 07/27/20 0639 07/28/20 1605 07/28/20 2253 07/29/20 1752  WBC 12.0*   < > 10.0   < > 6.6 5.1 6.1  NEUTROABS 9.9*  --   --   --  4.6  --  4.2  HGB 9.8*   < > 9.9*   < > 9.9* 9.0* 9.7*  HCT 29.5*   < > 29.1*   < > 29.1* 26.8* 29.6*  MCV 92.5   --  93.6  --  91.5 92.1 92.8  PLT 158   < > 142*   < > 150 158 157   < > = values in this interval not displayed.   Blood Culture    Component Value Date/Time   SDES IN/OUT CATH URINE 07/27/2020 1052   SPECREQUEST  07/27/2020 1052    NONE Performed at [facility] [address] [place], [place] [address]    CULT 1,000 COLONIES/mL STAPHYLOCOCCUS EPIDERMIDIS (A) 07/27/2020 1052   REPTSTATUS 07/29/2020 FINAL 07/27/2020 1052   CBG: Recent Labs  Lab 07/30/20 0727 07/30/20 1209 07/30/20 1632 07/30/20 2205 07/31/20 0802  GLUCAP 331* 119* 276* 259* 216*   Studies/Results: ECHOCARDIOGRAM COMPLETE  Result Date: 07/29/2020    ECHOCARDIOGRAM REPORT   Patient Name:   HONORIO DEVOL Date of Exam: 07/29/2020 Medical Rec #:  [id number]       Height:       69.0 in Accession #:    [id number]      Weight:  159.2 lb Date of Birth:  [birth year]       BSA:          1.875 m Patient Age:    64 years        BP:           96/53 mmHg Patient Gender: M               HR:           81 bpm. Exam Location:  Inpatient Procedure: 2D Echo Indications:    dyspnea 786.09  History:        Patient has no prior history of Echocardiogram examinations. End                 stage renal disease. Sepsis.; Risk Factors:Diabetes and Current                 Smoker.  Sonographer:    Johny Chess Referring Phys: [id number] [place]  1. Left ventricular ejection fraction, by estimation, is 55 to 60%. The left ventricle has normal function. The left ventricle has no regional wall motion abnormalities. Left ventricular diastolic parameters are indeterminate.  2. Right ventricular systolic function is normal. The right ventricular size is normal.  3. Left atrial size was severely dilated.  4. The mitral valve is normal in structure. Mild mitral valve regurgitation. No evidence of mitral stenosis.  5. The aortic valve is tricuspid. Aortic valve regurgitation is not visualized. No aortic stenosis is  present.  6. The inferior vena cava is normal in size with greater than 50% respiratory variability, suggesting right atrial pressure of 3 mmHg. FINDINGS  Left Ventricle: Left ventricular ejection fraction, by estimation, is 55 to 60%. The left ventricle has normal function. The left ventricle has no regional wall motion abnormalities. The left ventricular internal cavity size was normal in size. There is  no left ventricular hypertrophy. Left ventricular diastolic parameters are indeterminate. Right Ventricle: The right ventricular size is normal. No increase in right ventricular wall thickness. Right ventricular systolic function is normal. Left Atrium: Left atrial size was severely dilated. Right Atrium: Right atrial size was normal in size. Pericardium: There is no evidence of pericardial effusion. Mitral Valve: The mitral valve is normal in structure. Mild mitral valve regurgitation. No evidence of mitral valve stenosis. Tricuspid Valve: The tricuspid valve is normal in structure. Tricuspid valve regurgitation is not demonstrated. No evidence of tricuspid stenosis. Aortic Valve: The aortic valve is tricuspid. Aortic valve regurgitation is not visualized. No aortic stenosis is present. Aortic valve mean gradient measures 4.3 mmHg. Aortic valve peak gradient measures 8.1 mmHg. Aortic valve area, by VTI measures 3.07 cm. Pulmonic Valve: The pulmonic valve was not well visualized. Pulmonic valve regurgitation is not visualized. No evidence of pulmonic stenosis. Aorta: The aortic root is normal in size and structure. Pulmonary Artery: Indeterminant PASP, inadequate TR jet. Venous: The inferior vena cava is normal in size with greater than 50% respiratory variability, suggesting right atrial pressure of 3 mmHg. IAS/Shunts: No atrial level shunt detected by color flow Doppler.  LEFT VENTRICLE PLAX 2D LVIDd:         5.70 cm  Diastology LVIDs:         3.70 cm  LV e' lateral:   9.68 cm/s LV PW:         1.00 cm  LV E/e'  lateral: 11.2 LV IVS:        0.90 cm  LV e' medial:  8.59 cm/s LVOT diam:     2.30 cm  LV E/e' medial:  12.6 LV SV:         94 LV SV Index:   50 LVOT Area:     4.15 cm  RIGHT VENTRICLE             IVC RV S prime:     12.00 cm/s  IVC diam: 1.70 cm TAPSE (M-mode): 2.4 cm LEFT ATRIUM             Index       RIGHT ATRIUM           Index LA diam:        4.20 cm 2.24 cm/m  RA Area:     15.80 cm LA Vol (A2C):   84.8 ml 45.23 ml/m RA Volume:   42.00 ml  22.40 ml/m LA Vol (A4C):   76.4 ml 40.75 ml/m LA Biplane Vol: 84.8 ml 45.23 ml/m  AORTIC VALVE AV Area (Vmax):    3.06 cm AV Area (Vmean):   2.77 cm AV Area (VTI):     3.07 cm AV Vmax:           142.01 cm/s AV Vmean:          98.806 cm/s AV VTI:            0.306 m AV Peak Grad:      8.1 mmHg AV Mean Grad:      4.3 mmHg LVOT Vmax:         104.64 cm/s LVOT Vmean:        65.900 cm/s LVOT VTI:          0.226 m LVOT/AV VTI ratio: 0.74  AORTA Ao Root diam: 3.70 cm Ao Asc diam:  3.20 cm MITRAL VALVE MV Area (PHT): 3.91 cm      SHUNTS MV Decel Time: 194 msec      Systemic VTI:  0.23 m MR Peak grad:    99.2 mmHg   Systemic Diam: 2.30 cm MR Mean grad:    59.0 mmHg MR Vmax:         498.00 cm/s MR Vmean:        358.0 cm/s MR PISA:         0.57 cm MR PISA Eff ROA: 4 mm MR PISA Radius:  0.30 cm MV E velocity: 108.00 cm/s MV A velocity: 111.00 cm/s MV E/A ratio:  0.97 Carlyle Dolly MD Electronically signed by Carlyle Dolly MD Signature Date/Time: 07/29/2020/2:58:44 PM    Final     Medications: . vancomycin     . aspirin EC  81 mg Oral Daily  . Chlorhexidine Gluconate Cloth  6 each Topical Q0600  . clopidogrel  75 mg Oral Q breakfast  . darbepoetin (ARANESP) injection - DIALYSIS  60 mcg Intravenous Q Fri-HD  . ferric citrate  210 mg Oral TID WC  . gabapentin  300 mg Oral Daily  . heparin  5,000 Units Subcutaneous Q8H  . insulin aspart  0-15 Units Subcutaneous TID WC  . insulin aspart  0-5 Units Subcutaneous QHS  . magnesium oxide  400 mg Oral TID  .  multivitamin  1 tablet Oral Daily  . nicotine  14 mg Transdermal Daily  . omeprazole  40 mg Oral Q0600  . rosuvastatin  10 mg Oral Daily    Dialysis Orders: [facility]- NxStage 3hrs, BFR400,DFR 800,EDW 70kg,2K  Access:R. Upper AV fistula Heparin3000 Midodrine 10mg  qHD  Assessment/Plan: 1. Sepsis likely d/t  diabetic foot ulcer and vascular insufficiency of extremity - BC sent and pending- negative to date. urine culture appears negative as well.  Wound care following. IV Vanc per pharm. Left heel MRI concerning for osteo - s/p bone bx 7/31, results pending.     2. ESRD- Nx stage pt - MWF here - K better 3.7 pre HD Friday.  Check labs prior to HD.  HD today using 4K bath - orders written.  3. Hypotension/volume- On midodrine with HD for BP support.  BP limiting UF net UF 594 ml Friday - lowest BP 74/44 - no wts- NEEDS standing weights with HD today.  He says that midodrine "doesn't work" and gives him urinary urgency-  Does not want to continue- Dr. Moshe Cipro gave him permission to stop. 4. Anemiaof CKD- Last Hgb 9.7, recheck pre HD today. No recent ESA dose noted in chart. Started Aranesp 60 7/30 (q Friday) 5. Secondary Hyperparathyroidism -Ca and phos in goal.Continue Auryxia.  Not on VDRA. 6. Nutrition- Albumin low.  Continue heart health/carb modified diet w/ fluid restirction. Follow K, if becomes elevated, change to renal diet.  7. Tobacco abuse - Started on nicotine patch per primary. 8. COPD - stable, home meds resumed per primary.  9. Diabetes mellitus - sliding scale insulin ordered, monitored by primary.    Jen Mow, PA-C [facility] 07/31/2020,9:19 AM  LOS: 4 days   I have seen and examined this patient and agree with plan and assessment in the above note with renal recommendations/intervention highlighted.  Feels well, no complaints.  Continue with HD on MWF schedule.  Biopsy results pending.  Governor Rooks Cobin Cadavid,MD 07/31/2020  2:31 PM

## 2020-07-31 NOTE — Progress Notes (Signed)
Pharmacy Antibiotic Note  Duane Hall is a 64 y.o. male admitted on 07/26/2020 with Left footcalcaneus osteomyelitis Pharmacy has been consulted for vancomycin and cefepime dosing.  Afebrile, WBC wnl. Bone bx 7/31 culture pending.   Plan: Start cefepime 1g Q24hr  Continue vancomycin 750mg  post HD MWF Monitor cultures, clinical status, renal fx, vanc level as needed Narrow abx as able and f/u duration    Height: 5\' 9"  (175.3 cm) Weight: 72.2 kg (159 lb 2.8 oz) IBW/kg (Calculated) : 70.7  Temp (24hrs), Avg:98.2 F (36.8 C), Min:97.9 F (36.6 C), Max:98.5 F (36.9 C)  Recent Labs  Lab 07/26/20 2345 07/27/20 0639 07/28/20 1605 07/28/20 2253 07/29/20 0330 07/29/20 1752  WBC 12.0* 10.0 6.6 5.1  --  6.1  CREATININE 4.39* 4.67*  --  6.67* 3.30*  --   LATICACIDVEN 1.1  --   --   --   --   --     Estimated Creatinine Clearance: 23.2 mL/min (A) (by C-G formula based on SCr of 3.3 mg/dL (H)).    No Known Allergies   Antimicrobials: Cefepime 7/29 >> 7/31; 8/2 >>  Vancomycin 7/29 >>  Microbiology: 7/28 BCx: NGTD 7/29 UCx: 1k staph epidermidis> contaminant  7/29 MRSA PCR: negative 7/31 bone bx: pending  Thank you for allowing pharmacy to be a part of this patient's care.  Benetta Spar, PharmD, BCPS, BCCP Clinical Pharmacist  Please check AMION for all [place] phone numbers After 10:00 PM, call [place] [phone]

## 2020-07-31 NOTE — Progress Notes (Signed)
Inpatient Diabetes Program Recommendations  AACE/ADA: New Consensus Statement on Inpatient Glycemic Control (2015)  Target Ranges:  Prepandial:   less than 140 mg/dL      Peak postprandial:   less than 180 mg/dL (1-2 hours)      Critically ill patients:  140 - 180 mg/dL   Results for FORD, PEDDIE" (MRN [id number]) as of 07/31/2020 10:50  Ref. Range 07/30/2020 07:27 07/30/2020 12:09 07/30/2020 16:32 07/30/2020 22:05  Glucose-Capillary Latest Ref Range: 70 - 99 mg/dL 331 (H)  11 units NOVOLOG  119 (H) 276 (H)  8 units NOVOLOG  259 (H)  3 units NOVOLOG    Results for TARAY, NORMOYLE" (MRN [id number]) as of 07/31/2020 10:50  Ref. Range 07/31/2020 08:02  Glucose-Capillary Latest Ref Range: 70 - 99 mg/dL 216 (H)  5 units NOVOLOG     Admit with: Sepsis due to Foot Ulcer/ Osteomyelitis  History: DM, ESRD  Home DM Meds: Glipizide 10 mg BID  Current Orders: Novolog Moderate Correction Scale/ SSI (0-15 units) TID AC + HS    MD- Note AM CBGs have been elevated the last 2 days (331 yesterday and 216 this AM).  Please consider adding low dose basal insulin to in-hospital insulin regimen while home oral DM meds are on hold:  Levemir 7 units Daily (0.1 units/kg)     --Will follow patient during hospitalization--  Wyn Quaker RN, MSN, CDE Diabetes Coordinator Inpatient Glycemic Control Team Team Pager: [phone] (8a-5p)

## 2020-08-01 LAB — GLUCOSE, CAPILLARY
Glucose-Capillary: 116 mg/dL — ABNORMAL HIGH (ref 70–99)
Glucose-Capillary: 157 mg/dL — ABNORMAL HIGH (ref 70–99)
Glucose-Capillary: 157 mg/dL — ABNORMAL HIGH (ref 70–99)
Glucose-Capillary: 358 mg/dL — ABNORMAL HIGH (ref 70–99)
Glucose-Capillary: 358 mg/dL — ABNORMAL HIGH (ref 70–99)

## 2020-08-01 LAB — CULTURE, BLOOD (ROUTINE X 2)
Culture: NO GROWTH
Culture: NO GROWTH
Special Requests: ADEQUATE
Special Requests: ADEQUATE

## 2020-08-01 LAB — SURGICAL PATHOLOGY

## 2020-08-01 MED ORDER — GLIPIZIDE 10 MG PO TABS: 10.0000 mg | ORAL_TABLET | Freq: Every day | ORAL | 0 refills | Status: DC

## 2020-08-01 MED ORDER — COLLAGENASE 250 UNIT/GM EX OINT
TOPICAL_OINTMENT | Freq: Every day | CUTANEOUS | Status: DC
  Filled 2020-08-01: qty 30

## 2020-08-01 MED ORDER — SACCHAROMYCES BOULARDII 250 MG PO CAPS: 250.0000 mg | ORAL_CAPSULE | Freq: Two times a day (BID) | ORAL | 0 refills | Status: AC

## 2020-08-01 MED ORDER — DOXYCYCLINE HYCLATE 100 MG PO TABS
100.0000 mg | ORAL_TABLET | Freq: Two times a day (BID) | ORAL | Status: DC
  Administered 2020-08-01: 100 mg via ORAL
  Filled 2020-08-01: qty 1

## 2020-08-01 MED ORDER — DOXYCYCLINE HYCLATE 100 MG PO TABS: 100.0000 mg | ORAL_TABLET | Freq: Two times a day (BID) | ORAL | 0 refills | Status: DC

## 2020-08-01 MED ORDER — SACCHAROMYCES BOULARDII 250 MG PO CAPS
250.0000 mg | ORAL_CAPSULE | Freq: Two times a day (BID) | ORAL | Status: DC
  Administered 2020-08-01: 250 mg via ORAL
  Filled 2020-08-01: qty 1

## 2020-08-01 MED ORDER — NICOTINE 14 MG/24HR TD PT24: 14.0000 mg | MEDICATED_PATCH | Freq: Every day | TRANSDERMAL | 0 refills | Status: DC

## 2020-08-01 NOTE — Discharge Summary (Addendum)
Discharge Summary  Duane Hall PJK:[id number] DOB: 06/20/1956  PCP: Pieter Partridge, PA  Admit date: 07/26/2020 Discharge date: 08/01/2020  Time spent: 35 minutes  Recommendations for Outpatient Follow-up:  1. Follow-up with podiatry, Dr. Sherryle Lis, you have an appointment on Friday 08/04/20 at 4 PM. 2. Follow-up with your nephrologist 3. Follow-up with your primary care provider 4. Keep your hemodialysis appointments. 5. Continue local wound care with daily Santyl dressing changes, Dr. Sherryle Lis will follow the culture results. "Apply Santyl to ulcers on heel daily with saline wet to dry dressings."  Recommendations per podiatry: -Discontinue Betadine administration daily, switch to Santyl with saline moistened wet-to-dry dressings -Continue offloading bilateral heels at all time -Continue antibiotics for now  Discharge Diagnoses:  Active Hospital Problems   Diagnosis Date Noted  . Sepsis ([place]) 07/27/2020  . Acute osteomyelitis of left calcaneus (H[facility])   . Diabetic foot ulcer ([place]) 07/27/2020  . ESRD (end stage renal disease) ([place]) 07/27/2020  . Vascular insufficiency of extremity 06/18/2020  . Hypokalemia 09/09/2019  . Hepatitis C 10/15/2013  . Hypertension 02/24/2012  . Type II diabetes mellitus ([place]) 02/24/2012    Resolved Hospital Problems  No resolved problems to display.    Discharge Condition: Stable  Diet recommendation: Carb modified heart healthy hemodialysis diet.  Vitals:   08/01/20 0046 08/01/20 0622  BP: 119/68 (!) 143/69  Pulse: 88 86  Resp: 16 18  Temp: 98.5 F (36.9 C) 98.1 F (36.7 C)  SpO2: 100% 100%    History of present illness:  Patient admitted to the hospital with a working diagnosis of sepsis due to diabetic foot ulcer, end-organ failure hypotension.  64 year old male with significant past medical history for end-stage renal disease on hemodialysis who presents with confusion and low blood pressure. He was brought to the hospital  due to worsening hypotension after hemodialysis. Reported worsening edema, erythema and odor at the wound site for several days. He had a recent admission for diabetic foot ulcer,related to peripheral vascular disease, s/pendarterectomy and balloon angioplasty to the left superior femoral artery.After administration of intravenous fluids blood pressure 131/58, pulse rate 82, respirate 17, temperature 98.7, oxygenation 99%. His lungs were clear to auscultation bilaterally, heart S1-S2, present rhythmic, soft abdomen, no lower extremity edema. Left foot with loca edema and large heal ulcer with necrotic scar, positive erythema and local tenderness along with increased local temperature. Sodium 132, potassium 2.9, chloride 91, bicarb 29, glucose 167, BUN 25, creatinine 4.39, lactic acid 1.1, white count 12.0, hemoglobin 9.8, hematocrit 29.5, platelets 158. SARS COVID-19 negative. Urinalysis 11-20 white cells, 11-20 red cells, specific gravity 1.020, >300 protein. Chest radiograph with hyperinflation, bibasilar atelectasis. EKG 90 bpm, normal axis, normal intervals, sinus rhythm, Q wave V1-V2, no ST segment or T wave changes, positive LVH.  Further work up with left foot MRI, positive for calcaneus bone osteomyelitis.  Patient underwent today, bone biopsy, depending on the results will approach next step.  08/01/20: Upset that the bed is uncomfortable.  Working on getting an air bed.  Reports some tingling in his lower extremities and back pain when he lays on the bed.  Discussed with Dr. Sherryle Lis, podiatry, okay to discharge.  Continue local wound care with daily Santyl dressing changes, Dr. Myrtha Mantis will follow the culture results.  Will prescribe doxycycline x7 days, podiatry will adjust antibiotics according to culture results.    Hospital Course:  Principal Problem:   Sepsis [facility]) Active Problems:   Type II diabetes mellitus ([place])   Hypertension  Hepatitis C   Hypokalemia    Vascular insufficiency of extremity   Diabetic foot ulcer (HCC)   ESRD (end stage renal disease) ([place])   Acute osteomyelitis of left calcaneus (HCC)   Left footcalcaneus osteomyelitisin the setting of peripheral vascular disease, complicated with sepsis, end-organ failure hypotension (present on admission). Foot films with no bony lesions.Foot MRI with superficial ulceration overlying the posterior calcaneus, with diffuse subcutaneous inflammation, suggestive for osteomyelitis.Sepsis now has resolved. Received IV vancomycin and cefepime. Started p.o. doxycycline 100 mg twice daily on 08/01/2020 x 7 days Deep wound/bone cultures, cx pending, no organisms seen thus far on gram stain.  Bone culture reincubated for better growth. Continue local wound care TOC consulted to assist with home health services RN for local wound care. Podiatry, Dr. Sherryle Lis, will follow cultures outpatient.  Peripheral vascular disease Continueasa, clopidogrel and statin therapy.  ESRD with resolved hypokalemia/ hypomagnesemia/ hyponatremia/volume hypotension, previously on midodrine. Follow-up with nephrology Per nephrology, midodrine with HD stopped with Dr. Shelva Majestic permission b/c he says it "doesn't work" and gives him urinary urgency- Does not want to continue  T2DM/with hyperglycemia.   Hemoglobin A1c 7.2 on 07/27/2020.  On glipizide prior to admission.  Resume glipizide, decreased to 10 mg daily due to ESRD.  Follow-up with your PCP.  HTN.   BP stable Follow-up with your PCP  Anemia of chronic renal disease.   No overt bleeding Hemoglobin uptrending, 9.7.  Back pain, reports from laying in the uncomfortable bed Will change the bed, possible air bed Analgesics, Tylenol, PRN   Polyneuropathy Continue home dose gabapentin    Consultants:  Podiatry  Procedures:  Left foot calcaneous bone biopsy   Discharge Exam: BP (!) 143/69   Pulse 86   Temp 98.1 F (36.7 C)  (Oral)   Resp 18   Ht 5\' 9"  (1.753 m)   Wt 73.6 kg   SpO2 100%   BMI 23.96 kg/m  . General: 65 y.o. year-old male well developed well nourished in no acute distress.  Alert and oriented x3. . Cardiovascular: Regular rate and rhythm with no rubs or gallops.  No thyromegaly or JVD noted.   Marland Kitchen Respiratory: Clear to auscultation with no wheezes or rales. Good inspiratory effort. . Abdomen: Soft nontender nondistended with normal bowel sounds x4 quadrants. . Musculoskeletal: Lower extremity edema bilaterally.  Discharge Instructions You were cared for by a hospitalist during your hospital stay. If you have any questions about your discharge medications or the care you received while you were in the hospital after you are discharged, you can call the unit and asked to speak with the hospitalist on call if the hospitalist that took care of you is not available. Once you are discharged, your primary care physician will handle any further medical issues. Please note that NO REFILLS for any discharge medications will be authorized once you are discharged, as it is imperative that you return to your primary care physician (or establish a relationship with a primary care physician if you do not have one) for your aftercare needs so that they can reassess your need for medications and monitor your lab values.   Allergies as of 08/01/2020   No Known Allergies     Medication List    STOP taking these medications   midodrine 10 MG tablet Commonly known as: PROAMATINE     TAKE these medications   aspirin 81 MG EC tablet Take 1 tablet (81 mg total) by mouth daily. Swallow whole.   Auryxia 1  GM 210 MG(Fe) tablet Generic drug: ferric citrate Take 210 mg by mouth 3 (three) times daily with meals.   clopidogrel 75 MG tablet Commonly known as: PLAVIX Take 1 tablet (75 mg total) by mouth daily with breakfast.   doxycycline 100 MG tablet Commonly known as: VIBRA-TABS Take 1 tablet (100 mg total) by  mouth every 12 (twelve) hours for 7 days.   gabapentin 100 MG capsule Commonly known as: NEURONTIN Take 300 mg by mouth daily.   glipiZIDE 10 MG tablet Commonly known as: GLUCOTROL Take 1 tablet (10 mg total) by mouth daily before breakfast. What changed: when to take this   lidocaine-prilocaine cream Commonly known as: EMLA Apply 1 application topically See admin instructions. Apply topically to port access one hour prior to dialysis on Sunday, Monday, Wednesday, Thursday   MIRCERA IJ Inject into the skin.   multivitamin Tabs tablet Take 1 tablet by mouth daily.   mupirocin ointment 2 % Commonly known as: BACTROBAN Apply 1 application topically See admin instructions. Apply topically to port access after dialysis - Sunday, Monday, Wednesday, Thursday   nicotine 14 mg/24hr patch Commonly known as: NICODERM CQ - dosed in mg/24 hours Place 1 patch (14 mg total) onto the skin daily. Start taking on: August 02, 2020   omeprazole 40 MG capsule Commonly known as: PRILOSEC Take 40 mg by mouth daily.   rosuvastatin 10 MG tablet Commonly known as: CRESTOR Take 1 tablet (10 mg total) by mouth daily.   saccharomyces boulardii 250 MG capsule Commonly known as: FLORASTOR Take 1 capsule (250 mg total) by mouth 2 (two) times daily for 7 days.   Santyl ointment Generic drug: collagenase Apply 1 application topically daily. Apply to the left heel daily with a layer the thickness of a nickel   sennosides-docusate sodium 8.6-50 MG tablet Commonly known as: SENOKOT-S Take 1 tablet by mouth daily as needed for constipation.      No Known Allergies  Follow-up Information    Pieter Partridge, PA. Call in 1 day(s).   Specialty: Family Medicine Why: Please call for a post hospital follow up appointment Contact information: [address] [place] [address] [place], Damar, DPM. Call in 1 day(s).   Specialty: Podiatry Why: please call for a  post hospital follow up appointment Contact information: [place] [address] [phone]        Corliss Parish, MD. Call in 1 day(s).   Specialty: Nephrology Why: please call for a post hospital follow up appointment Contact information: [place] [address] [phone]                The results of significant diagnostics from this hospitalization (including imaging, microbiology, ancillary and laboratory) are listed below for reference.    Significant Diagnostic Studies: MR FOOT LEFT WO CONTRAST  Result Date: 07/27/2020 CLINICAL DATA:  Question of osteomyelitis with area of ulceration EXAM: MRI OF THE LEFT FOOT WITHOUT CONTRAST TECHNIQUE: Multiplanar, multisequence MR imaging of the left hindfoot was performed. No intravenous contrast was administered. COMPARISON:  None. FINDINGS: Bones/Joint/Cartilage There is T2 hyperintense signal with subtle T1 hypointensity involving the posterior calcaneus. There is subtle area of cortical irregularity seen at the posterior calcaneus. No periosteal reaction. No osseous fracture seen. Normal osseous marrow signal seen throughout. No large joint effusion. There is trace subtalar and ankle joint effusion. Ligaments Visualized portions are intact. Muscles and Tendons Mildly increased  signal is seen throughout the ankle and foot with diffuse mild fatty atrophy. The visualized portions of the flexor and extensor tendons are intact. The Achilles tendon is intact. Minimally increased signal seen at the insertion site of the Achilles tendon. The plantar fascia appears to be intact. Soft tissues Area of superficial ulceration overlying the posterior calcaneus with diffuse subcutaneous in heel pad inflammation. There is diffuse skin thickening. No loculated fluid collections or sinus tract is seen. IMPRESSION: Area of superficial ulceration of the posterior calcaneus with diffuse heel pad inflammation. No sinus tract  or abscess. Findings suggestive of acute osteomyelitis involving the posterior calcaneus. No osseous fracture. Mild Achilles tendinosis. Diffuse muscular edema/denervation atrophy. Electronically Signed   By: Prudencio Pair M.D.   On: 07/27/2020 19:22   DG Chest Port 1 View  Result Date: 07/26/2020 CLINICAL DATA:  Fever EXAM: PORTABLE CHEST 1 VIEW COMPARISON:  09/08/2019 FINDINGS: The heart size and mediastinal contours are within normal limits. Aortic atherosclerosis. Both lungs are clear. The visualized skeletal structures are unremarkable. IMPRESSION: No active disease. Electronically Signed   By: Donavan Foil M.D.   On: 07/26/2020 23:39   DG Foot 2 Views Left  Result Date: 07/27/2020 CLINICAL DATA:  Foot infection, open wound EXAM: LEFT FOOT - 2 VIEW COMPARISON:  06/18/2020 FINDINGS: Soft tissue wound is noted over the heel. No definitive bony erosive changes to suggest osteomyelitis are noted. No acute fracture or dislocation is seen. IMPRESSION: Soft tissue wound without acute findings of osteomyelitis. Electronically Signed   By: Inez Catalina M.D.   On: 07/27/2020 03:15   VAS [place] ABI WITH/WO TBI  Result Date: 07/14/2020 LOWER EXTREMITY DOPPLER STUDY Indications: Ulceration. Atherectomy High Risk Factors: Diabetes, current smoker.  Vascular Interventions: Right arm basilic vein transposition. Performing Technologist: Alvia Grove RVT  Examination Guidelines: A complete evaluation includes at minimum, Doppler waveform signals and systolic blood pressure reading at the level of bilateral brachial, anterior tibial, and posterior tibial arteries, when vessel segments are accessible. Bilateral testing is considered an integral part of a complete examination. Photoelectric Plethysmograph (PPG) waveforms and toe systolic pressure readings are included as required and additional duplex testing as needed. Limited examinations for reoccurring indications may be performed as noted.  ABI Findings:  +---------+------------------+-----+----------+--------+ Right    Rt Pressure (mmHg)IndexWaveform  Comment  +---------+------------------+-----+----------+--------+ Brachial                                  AVF      +---------+------------------+-----+----------+--------+ PTA      122               0.90 biphasic           +---------+------------------+-----+----------+--------+ DP       111               0.82 monophasic         +---------+------------------+-----+----------+--------+ Great Toe171               1.26 Normal             +---------+------------------+-----+----------+--------+ +---------+------------------+-----+----------+-------+ Left     Lt Pressure (mmHg)IndexWaveform  Comment +---------+------------------+-----+----------+-------+ Brachial 136                                      +---------+------------------+-----+----------+-------+ PTA      132  0.97 monophasic        +---------+------------------+-----+----------+-------+ DP       118               0.87 monophasic        +---------+------------------+-----+----------+-------+ Great Toe66                0.49 Abnormal          +---------+------------------+-----+----------+-------+ +-------+-----------+-----------+------------+------------+ ABI/TBIToday's ABIToday's TBIPrevious ABIPrevious TBI +-------+-----------+-----------+------------+------------+ Right  0.90       1.26                                +-------+-----------+-----------+------------+------------+ Left   0.7        0.49                                +-------+-----------+-----------+------------+------------+  Summary: Right: Resting right ankle-brachial index indicates mild right lower extremity arterial disease. The right toe-brachial index is normal. Left: Resting left ankle-brachial index indicates noncompressible left lower extremity arteries. The left toe-brachial index is abnormal.   *See table(s) above for measurements and observations.  Electronically signed by [name] MD on 07/14/2020 at 11:21:54 AM.    Final    ECHOCARDIOGRAM COMPLETE  Result Date: 07/29/2020    ECHOCARDIOGRAM REPORT   Patient Name:   ARLING CERONE Date of Exam: 07/29/2020 Medical Rec #:  [id number]       Height:       69.0 in Accession #:    [id number]      Weight:       159.2 lb Date of Birth:  1956/06/11       BSA:          1.875 m Patient Age:    57 years        BP:           96/53 mmHg Patient Gender: M               HR:           81 bpm. Exam Location:  Inpatient Procedure: 2D Echo Indications:    dyspnea 786.09  History:        Patient has no prior history of Echocardiogram examinations. End                 stage renal disease. Sepsis.; Risk Factors:Diabetes and Current                 Smoker.  Sonographer:    Johny Chess Referring Phys: [id number] [place]  1. Left ventricular ejection fraction, by estimation, is 55 to 60%. The left ventricle has normal function. The left ventricle has no regional wall motion abnormalities. Left ventricular diastolic parameters are indeterminate.  2. Right ventricular systolic function is normal. The right ventricular size is normal.  3. Left atrial size was severely dilated.  4. The mitral valve is normal in structure. Mild mitral valve regurgitation. No evidence of mitral stenosis.  5. The aortic valve is tricuspid. Aortic valve regurgitation is not visualized. No aortic stenosis is present.  6. The inferior vena cava is normal in size with greater than 50% respiratory variability, suggesting right atrial pressure of 3 mmHg. FINDINGS  Left Ventricle: Left ventricular ejection fraction, by estimation, is 55 to 60%. The left ventricle has normal function. The left ventricle has no regional wall  motion abnormalities. The left ventricular internal cavity size was normal in size. There is  no left ventricular hypertrophy. Left ventricular diastolic  parameters are indeterminate. Right Ventricle: The right ventricular size is normal. No increase in right ventricular wall thickness. Right ventricular systolic function is normal. Left Atrium: Left atrial size was severely dilated. Right Atrium: Right atrial size was normal in size. Pericardium: There is no evidence of pericardial effusion. Mitral Valve: The mitral valve is normal in structure. Mild mitral valve regurgitation. No evidence of mitral valve stenosis. Tricuspid Valve: The tricuspid valve is normal in structure. Tricuspid valve regurgitation is not demonstrated. No evidence of tricuspid stenosis. Aortic Valve: The aortic valve is tricuspid. Aortic valve regurgitation is not visualized. No aortic stenosis is present. Aortic valve mean gradient measures 4.3 mmHg. Aortic valve peak gradient measures 8.1 mmHg. Aortic valve area, by VTI measures 3.07 cm. Pulmonic Valve: The pulmonic valve was not well visualized. Pulmonic valve regurgitation is not visualized. No evidence of pulmonic stenosis. Aorta: The aortic root is normal in size and structure. Pulmonary Artery: Indeterminant PASP, inadequate TR jet. Venous: The inferior vena cava is normal in size with greater than 50% respiratory variability, suggesting right atrial pressure of 3 mmHg. IAS/Shunts: No atrial level shunt detected by color flow Doppler.  LEFT VENTRICLE PLAX 2D LVIDd:         5.70 cm  Diastology LVIDs:         3.70 cm  LV e' lateral:   9.68 cm/s LV PW:         1.00 cm  LV E/e' lateral: 11.2 LV IVS:        0.90 cm  LV e' medial:    8.59 cm/s LVOT diam:     2.30 cm  LV E/e' medial:  12.6 LV SV:         94 LV SV Index:   50 LVOT Area:     4.15 cm  RIGHT VENTRICLE             IVC RV S prime:     12.00 cm/s  IVC diam: 1.70 cm TAPSE (M-mode): 2.4 cm LEFT ATRIUM             Index       RIGHT ATRIUM           Index LA diam:        4.20 cm 2.24 cm/m  RA Area:     15.80 cm LA Vol (A2C):   84.8 ml 45.23 ml/m RA Volume:   42.00 ml  22.40 ml/m LA  Vol (A4C):   76.4 ml 40.75 ml/m LA Biplane Vol: 84.8 ml 45.23 ml/m  AORTIC VALVE AV Area (Vmax):    3.06 cm AV Area (Vmean):   2.77 cm AV Area (VTI):     3.07 cm AV Vmax:           142.01 cm/s AV Vmean:          98.806 cm/s AV VTI:            0.306 m AV Peak Grad:      8.1 mmHg AV Mean Grad:      4.3 mmHg LVOT Vmax:         104.64 cm/s LVOT Vmean:        65.900 cm/s LVOT VTI:          0.226 m LVOT/AV VTI ratio: 0.74  AORTA Ao Root diam: 3.70 cm Ao Asc diam:  3.20 cm  MITRAL VALVE MV Area (PHT): 3.91 cm      SHUNTS MV Decel Time: 194 msec      Systemic VTI:  0.23 m MR Peak grad:    99.2 mmHg   Systemic Diam: 2.30 cm MR Mean grad:    59.0 mmHg MR Vmax:         498.00 cm/s MR Vmean:        358.0 cm/s MR PISA:         0.57 cm MR PISA Eff ROA: 4 mm MR PISA Radius:  0.30 cm MV E velocity: 108.00 cm/s MV A velocity: 111.00 cm/s MV E/A ratio:  0.97 Carlyle Dolly MD Electronically signed by Carlyle Dolly MD Signature Date/Time: 07/29/2020/2:58:44 PM    Final    VAS [place] LOWER EXTREMITY ARTERIAL DUPLEX  Result Date: 07/14/2020 LOWER EXTREMITY ARTERIAL DUPLEX STUDY Indications: Ulceration.  Vascular Interventions: Right arm basilic vein transposition. Current ABI:            Right ABI 0.90 Left 0.97 Performing Technologist: Alvia Grove RVT  Examination Guidelines: A complete evaluation includes B-mode imaging, spectral Doppler, color Doppler, and power Doppler as needed of all accessible portions of each vessel. Bilateral testing is considered an integral part of a complete examination. Limited examinations for reoccurring indications may be performed as noted.  +-----------+--------+-----+---------------+----------+--------+ LEFT       PSV cm/sRatioStenosis       Waveform  Comments +-----------+--------+-----+---------------+----------+--------+ CFA Prox   185                         triphasic          +-----------+--------+-----+---------------+----------+--------+ CFA Distal 186                          triphasic          +-----------+--------+-----+---------------+----------+--------+ DFA        117                         biphasic           +-----------+--------+-----+---------------+----------+--------+ SFA Prox   445          75-99% stenosismonophasic         +-----------+--------+-----+---------------+----------+--------+ SFA Mid    229          50-74% stenosismonophasic         +-----------+--------+-----+---------------+----------+--------+ SFA Distal 143                         monophasic         +-----------+--------+-----+---------------+----------+--------+ POP Prox   138                         monophasic         +-----------+--------+-----+---------------+----------+--------+ POP Mid    110                         monophasic         +-----------+--------+-----+---------------+----------+--------+ POP Distal 287          50-74% stenosismonophasic         +-----------+--------+-----+---------------+----------+--------+ ATA Distal 16                          monophasic         +-----------+--------+-----+---------------+----------+--------+ PTA  Distal 76                          monophasic         +-----------+--------+-----+---------------+----------+--------+ PERO Distal23                          monophasic         +-----------+--------+-----+---------------+----------+--------+  Summary: Left: Segmental arterial occlusive disease with 75 - 99% stenosis in the proximal SFA.  See table(s) above for measurements and observations. Electronically signed by [name] MD on 07/14/2020 at 9:26:38 AM.    Final     Microbiology: Recent Results (from the past 240 hour(s))  Blood Culture (routine x 2)     Status: None (Preliminary result)   Collection Time: 07/26/20 11:45 PM   Specimen: BLOOD  Result Value Ref Range Status   Specimen Description BLOOD LEFT HAND  Final   Special Requests   Final    BOTTLES DRAWN AEROBIC  AND ANAEROBIC Blood Culture adequate volume   Culture   Final    NO GROWTH 4 DAYS Performed at [facility], [address] [place], [place] [address]    Report Status PENDING  Incomplete  Blood Culture (routine x 2)     Status: None (Preliminary result)   Collection Time: 07/27/20 12:04 AM   Specimen: BLOOD  Result Value Ref Range Status   Specimen Description BLOOD LEFT ARM  Final   Special Requests   Final    BOTTLES DRAWN AEROBIC AND ANAEROBIC Blood Culture adequate volume   Culture   Final    NO GROWTH 4 DAYS Performed at [facility], [place] [address], [place], [place] [address]    Report Status PENDING  Incomplete  SARS Coronavirus 2 by RT PCR (hospital order, performed in [facility] hospital lab) Nasopharyngeal Nasopharyngeal Swab     Status: None   Collection Time: 07/27/20  2:59 AM   Specimen: Nasopharyngeal Swab  Result Value Ref Range Status   SARS Coronavirus 2 NEGATIVE NEGATIVE Final    Comment: (NOTE) SARS-CoV-2 target nucleic acids are NOT DETECTED.  The SARS-CoV-2 RNA is generally detectable in upper and lower respiratory specimens during the acute phase of infection. The lowest concentration of SARS-CoV-2 viral copies this assay can detect is 250 copies / mL. A negative result does not preclude SARS-CoV-2 infection and should not be used as the sole basis for treatment or other patient management decisions.  A negative result may occur with improper specimen collection / handling, submission of specimen other than nasopharyngeal swab, presence of viral mutation(s) within the areas targeted by this assay, and inadequate number of viral copies (<250 copies / mL). A negative result must be combined with clinical observations, patient history, and epidemiological information.  Fact Sheet for Patients:   StrictlyIdeas.no  Fact Sheet for Healthcare Providers: BankingDealers.co.za  This test is not yet  approved or  cleared by the [place] FDA and has been authorized for detection and/or diagnosis of SARS-CoV-2 by FDA under an Emergency Use Authorization (EUA).  This EUA will remain in effect (meaning this test can be used) for the duration of the COVID-19 declaration under Section 564(b)(1) of the Act, 21 U.S.C. section 360bbb-3(b)(1), unless the authorization is terminated or revoked sooner.  Performed at [facility], [place] [address] [place], [place] [address]   Urine culture     Status: Abnormal  Collection Time: 07/27/20 10:52 AM   Specimen: In/Out Cath Urine  Result Value Ref Range Status   Specimen Description IN/OUT CATH URINE  Final   Special Requests   Final    NONE Performed at [facility], [place] [address], [place], [place] [address]    Culture 1,000 COLONIES/mL STAPHYLOCOCCUS EPIDERMIDIS (A)  Final   Report Status 07/29/2020 FINAL  Final  MRSA PCR Screening     Status: None   Collection Time: 07/27/20 10:19 PM   Specimen: Nasal Mucosa; Nasopharyngeal  Result Value Ref Range Status   MRSA by PCR NEGATIVE NEGATIVE Final    Comment:        The GeneXpert MRSA Assay (FDA approved for NASAL specimens only), is one component of a comprehensive MRSA colonization surveillance program. It is not intended to diagnose MRSA infection nor to guide or monitor treatment for MRSA infections. Performed at [facility], [place] [address] [place], [place] [address]   Aerobic/Anaerobic Culture (surgical/deep wound)     Status: None (Preliminary result)   Collection Time: 07/29/20  9:53 AM   Specimen: Bone; Tissue  Result Value Ref Range Status   Specimen Description BONE  Final   Special Requests LEFT CALCANEAL BONE SPEC A  Final   Gram Stain NO WBC SEEN NO ORGANISMS SEEN   Final   Culture   Final    CULTURE REINCUBATED FOR BETTER GROWTH CRITICAL RESULT CALLED TO, READ BACK BY AND VERIFIED WITH: RN J.[facility] AT 1230 ON 08/01/20 BY T.SAAD Performed at  [facility], [place] [address], [place], [place] [address]    Report Status PENDING  Incomplete     Labs: Basic Metabolic Panel: Recent Labs  Lab 07/26/20 2345 07/27/20 0639 07/28/20 2253 07/29/20 0330 07/31/20 2100  NA 132* 131* 133* 137 136  K 2.9* 3.0* 3.3* 3.7 4.2  CL 91* 92* 95* 100 100  CO2 29 28 26 29 27   GLUCOSE 167* 151* 247* 167* 155*  BUN 25* 30* 53* 17 44*  CREATININE 4.39* 4.67* 6.67* 3.30* 6.96*  CALCIUM 8.9 8.4* 8.4* 8.1* 8.7*  MG  --  1.5*  --   --   --   PHOS  --   --  3.1  --   --    Liver Function Tests: Recent Labs  Lab 07/26/20 2345 07/28/20 2253 07/31/20 2100  AST 53*  --  42*  ALT 24  --  43  ALKPHOS 121  --  115  BILITOT 0.7  --  0.7  PROT 6.2*  --  5.6*  ALBUMIN 3.0* 2.5* 2.6*   No results for input(s): LIPASE, AMYLASE in the last 168 hours. No results for input(s): AMMONIA in the last 168 hours. CBC: Recent Labs  Lab 07/26/20 2345 07/26/20 2345 07/27/20 0639 07/28/20 1605 07/28/20 2253 07/29/20 1752 07/31/20 2100  WBC 12.0*   < > 10.0 6.6 5.1 6.1 6.7  NEUTROABS 9.9*  --   --  4.6  --  4.2 4.4  HGB 9.8*   < > 9.9* 9.9* 9.0* 9.7* 9.1*  HCT 29.5*   < > 29.1* 29.1* 26.8* 29.6* 27.9*  MCV 92.5   < > 93.6 91.5 92.1 92.8 95.9  PLT 158   < > 142* 150 158 157 184   < > = values in this interval not displayed.   Cardiac Enzymes: No results for input(s): CKTOTAL, CKMB, CKMBINDEX, TROPONINI in the last 168 hours. BNP: BNP (last 3 results) Recent Labs  09/08/19 1710  BNP 438.8*    ProBNP (last 3 results) No results for input(s): PROBNP in the last 8760 hours.  CBG: Recent Labs  Lab 07/31/20 1142 07/31/20 1704 08/01/20 0046 08/01/20 0739 08/01/20 1152  GLUCAP 231* 274* 157*  157* 358*  358* 116*       Signed:  Kayleen Memos, MD [facility] 08/01/2020, 1:57 PM

## 2020-08-01 NOTE — Consult Note (Signed)
WOC Nurse Consult Note: Patient receiving care in [facility]. After a Secure Chat conversation with Dr. Aileen Fass, it was decided a follow up consult on the foot wound was not necessary.  Dr. Sherryle Lis has entered an order for the foot care. [name] Riles, RN, MSN, CWOCN, CNS-BC, pager [phone]

## 2020-08-01 NOTE — Progress Notes (Signed)
  Subjective:  Patient ID: MARSEAN ELKHATIB, male    DOB: 05/20/1956,  MRN: [id number]  Feels okay today. Not sleeping very well because of the bed. No fevers or chills. Wound still about the same to him.  Negative for chest pain and shortness of breath Chest pain: no Shortness of breath: no Fever: no Night sweats: no Constitutional signs: no Review of all other systems is negative Objective:   Vitals:   08/01/20 0046 08/01/20 0622  BP: 119/68 (!) 143/69  Pulse: 88 86  Resp: 16 18  Temp: 98.5 F (36.9 C) 98.1 F (36.7 C)  SpO2: 100% 100%   General AA&O x3. Normal mood and affect.  Vascular  capillary fill time at periwound edges intact.  Neurologic Epicritic sensation reduced about wounds  Dermatologic No open lesions. Interspaces clear of maceration. Wounds bilateral heels with hardened dry eschar, no drainage  Orthopedic: MMT 5/5 in dorsiflexion, plantarflexion, inversion, and eversion. Normal joint ROM without pain or crepitus.    Assessment & Plan:  Patient was evaluated and treated and all questions answered.   -Discussed the culture results with Richardson Landry and that the lack of growth so far is good news. It would be best if we are able to finalize the cultures and/or the path specimen to determine a definitive plan for the wounds and limb salvage -Discontinue Betadine administration daily, switch to Santyl with saline moistened wet-to-dry dressings -Continue offloading bilateral heels at all time -Continue antibiotics for now -will discuss with his primary team  [name], DPM  Accessible via secure chat for questions or concerns.

## 2020-08-01 NOTE — Progress Notes (Signed)
Patient returned to room from hemodialysis.

## 2020-08-01 NOTE — Progress Notes (Signed)
Patient discharged to home with instructions, demonstrated wound dressing change to patient's wife, verbalized understanding, some wound care supplies also given.

## 2020-08-01 NOTE — Progress Notes (Signed)
Inpatient Diabetes Program Recommendations  AACE/ADA: New Consensus Statement on Inpatient Glycemic Control (2015)  Target Ranges:  Prepandial:   less than 140 mg/dL      Peak postprandial:   less than 180 mg/dL (1-2 hours)      Critically ill patients:  140 - 180 mg/dL   Results for Duane Hall, Duane Hall" (MRN [id number]) as of 08/01/2020 09:09  Ref. Range 07/31/2020 08:02 07/31/2020 11:42 07/31/2020 17:04 08/01/2020 00:46  Glucose-Capillary Latest Ref Range: 70 - 99 mg/dL 216 (H)  5 units NOVOLOG  231 (H)  5 units NOVOLOG  274 (H)  8 units NOVOLOG  157 (H)   Results for Duane Hall, Duane Hall" (MRN [id number]) as of 08/01/2020 09:09  Ref. Range 08/01/2020 07:39  Glucose-Capillary Latest Ref Range: 70 - 99 mg/dL 358 (H)  15 units NOVOLOG     Admit with: Sepsis due to Foot Ulcer/ Osteomyelitis  History: DM, ESRD  Home DM Meds: Glipizide 10 mg BID  Current Orders: Novolog Moderate Correction Scale/ SSI (0-15 units) TID AC + HS    MD- Note AM CBGs have been elevated the last 3 days.  Please consider adding low dose basal insulin to in-hospital insulin regimen while home oral DM meds are on hold:  Levemir 7 units Daily (0.1 units/kg)    --Will follow patient during hospitalization--  Wyn Quaker RN, MSN, CDE Diabetes Coordinator Inpatient Glycemic Control Team Team Pager: [phone] (8a-5p)

## 2020-08-01 NOTE — Progress Notes (Signed)
PROGRESS NOTE  Duane Hall CLE:[id number] DOB: 12-Oct-1956 DOA: 07/26/2020 PCP: Pieter Partridge, PA  HPI/Recap of past 24 hours: Patient admitted to the hospital with a working diagnosis of sepsis due to diabetic foot ulcer, end-organ failure hypotension.  64 year old male with significant past medical history for end-stage renal disease on hemodialysis who presents with confusion and low blood pressure. He was brought to the hospital due to worsening hypotension after hemodialysis. Reported worsening edema, erythema and odor at the wound site for several days. He had a recent admission for diabetic foot ulcer,related to peripheral vascular disease, s/pendarterectomy and balloon angioplasty to the left superior femoral artery.After administration of intravenous fluids blood pressure 131/58, pulse rate 82, respirate 17, temperature 98.7, oxygenation 99%. His lungs were clear to auscultation bilaterally, heart S1-S2, present rhythmic, soft abdomen, no lower extremity edema. Left foot with loca edema and large heal ulcer with necrotic scar, positive erythema and local tenderness along with increased local temperature. Sodium 132, potassium 2.9, chloride 91, bicarb 29, glucose 167, BUN 25, creatinine 4.39, lactic acid 1.1, white count 12.0, hemoglobin 9.8, hematocrit 29.5, platelets 158. SARS COVID-19 negative. Urinalysis 11-20 white cells, 11-20 red cells, specific gravity 1.020, >300 protein. Chest radiograph with hyperinflation, bibasilar atelectasis. EKG 90 bpm, normal axis, normal intervals, sinus rhythm, Q wave V1-V2, no ST segment or T wave changes, positive LVH.  Further work up with left foot MRI, positive for calcaneus bone osteomyelitis.  Patient underwent today, bone biopsy, depending on the results will approach next step.   08/01/20: Upset that the bed is uncomfortable.  Working on getting an air bed.  Reports some tingling in his lower extremities and back pain when he  lays on the bed.  Assessment/Plan: Principal Problem:   Sepsis ([place]) Active Problems:   Type II diabetes mellitus (HCC)   Hypertension   Hepatitis C   Hypokalemia   Vascular insufficiency of extremity   Diabetic foot ulcer (HCC)   ESRD (end stage renal disease) ([place])   Acute osteomyelitis of left calcaneus (HCC)  Left footcalcaneus osteomyelitisin the setting of peripheral vascular disease, complicated with sepsis, end-organ failure hypotension (present on admission). Foot films with no bony lesions.Foot MRI with superficial ulceration overlying the posterior calcaneus, with diffuse subcutaneous inflammation, suggestive for osteomyelitis. Sepsis now has resolved.  Continue IV vancomycin Added cefepime on 02/29/2020  Continue to follow deep wound/bone cultures, cx pending, no organisms seen thus far on gram stain. Podiatry following, Dr. Sherryle Lis, appreciate recommendations.  Peripheral vascular disease Continue asa, clopidogrel and statin therapy.  ESRD on HD MWF/ with resolved hypokalemia/ hypomagnesemia/ hyponatremia. Patient gets home dialysis, per right upper extremity fistula. Will follow with nephrology for further inpatient treatment. HD today  T2DM/with hyperglycemia.  Continue insulin sliding scale.  Hemoglobin A1c 7.2 on 07/27/2020.  HTN.  BP stable, continue to monitor  Anemia of chronic renal disease.    Hemoglobin uptrending, 9.7.  Resolved chronic hypotension off Midodrine BP stable  Back pain, reports from laying in the bed Will change the bed, possible air bed Analgesics PRN   Neuropathy Continue gabapentin   Status is: Inpatient  Remains inpatient appropriate because:IV treatments appropriate due to intensity of illness or inability to take PO   Dispo: The patient is from: Home  Anticipated d/c is to: Home  Anticipated d/c date is: 08/02/20  Patient currently is not medically stable to d/c, ongoing  treatment for osteomyelitis with IV antibiotics. Awaiting bone cx results   DVT prophylaxis:  Heparin  SQ TID Code Status:              full  Family Communication:   None at bedside.    Nutrition Status:     Skin Documentation:    Consultants:   Podiatry   Procedures:   Left foot calcaneous bone biopsy   Antimicrobials:   IV vancomycin    IV cefepime, 07/31/20    Objective: Vitals:   07/31/20 2330 07/31/20 2357 08/01/20 0046 08/01/20 0622  BP: 124/64 130/71 119/68 (!) 143/69  Pulse: 79 84 88 86  Resp:  16 16 18   Temp:  98.3 F (36.8 C) 98.5 F (36.9 C) 98.1 F (36.7 C)  TempSrc:  Oral Oral Oral  SpO2:  98% 100% 100%  Weight:  73.6 kg    Height:        Intake/Output Summary (Last 24 hours) at 08/01/2020 1210 Last data filed at 08/01/2020 0710 Gross per 24 hour  Intake 791.22 ml  Output 1000 ml  Net -208.78 ml   Filed Weights   07/28/20 0430 07/31/20 2043 07/31/20 2357  Weight: 72.2 kg 73.6 kg 73.6 kg    Exam:  . General: 64 y.o. year-old male well-developed well-nourished in no acute distress.  Irritable mood.   . Cardiovascular: Regular rate and rhythm no rubs or gallops.   Marland Kitchen Respiratory: Clear to auscultation no wheezes no rales.   . Abdomen: Soft nontender normal bowel sounds present. . Musculoskeletal: Lower extremity edema bilaterally.  Left greater than right. . Psychiatry: Irritable mood.  Data Reviewed: CBC: Recent Labs  Lab 07/26/20 2345 07/26/20 2345 07/27/20 0639 07/28/20 1605 07/28/20 2253 07/29/20 1752 07/31/20 2100  WBC 12.0*   < > 10.0 6.6 5.1 6.1 6.7  NEUTROABS 9.9*  --   --  4.6  --  4.2 4.4  HGB 9.8*   < > 9.9* 9.9* 9.0* 9.7* 9.1*  HCT 29.5*   < > 29.1* 29.1* 26.8* 29.6* 27.9*  MCV 92.5   < > 93.6 91.5 92.1 92.8 95.9  PLT 158   < > 142* 150 158 157 184   < > = values in this interval not displayed.   Basic Metabolic Panel: Recent Labs  Lab 07/26/20 2345 07/27/20 0639 07/28/20 2253 07/29/20 0330  07/31/20 2100  NA 132* 131* 133* 137 136  K 2.9* 3.0* 3.3* 3.7 4.2  CL 91* 92* 95* 100 100  CO2 29 28 26 29 27   GLUCOSE 167* 151* 247* 167* 155*  BUN 25* 30* 53* 17 44*  CREATININE 4.39* 4.67* 6.67* 3.30* 6.96*  CALCIUM 8.9 8.4* 8.4* 8.1* 8.7*  MG  --  1.5*  --   --   --   PHOS  --   --  3.1  --   --    GFR: Estimated Creatinine Clearance: 11 mL/min (A) (by C-G formula based on SCr of 6.96 mg/dL (H)). Liver Function Tests: Recent Labs  Lab 07/26/20 2345 07/28/20 2253 07/31/20 2100  AST 53*  --  42*  ALT 24  --  43  ALKPHOS 121  --  115  BILITOT 0.7  --  0.7  PROT 6.2*  --  5.6*  ALBUMIN 3.0* 2.5* 2.6*   No results for input(s): LIPASE, AMYLASE in the last 168 hours. No results for input(s): AMMONIA in the last 168 hours. Coagulation Profile: Recent Labs  Lab 07/26/20 2345  INR 1.2   Cardiac Enzymes: No results for input(s): CKTOTAL, CKMB, CKMBINDEX, TROPONINI in the last 168  hours. BNP (last 3 results) No results for input(s): PROBNP in the last 8760 hours. HbA1C: No results for input(s): HGBA1C in the last 72 hours. CBG: Recent Labs  Lab 07/31/20 1142 07/31/20 1704 08/01/20 0046 08/01/20 0739 08/01/20 1152  GLUCAP 231* 274* 157*  157* 358*  358* 116*   Lipid Profile: No results for input(s): CHOL, HDL, LDLCALC, TRIG, CHOLHDL, LDLDIRECT in the last 72 hours. Thyroid Function Tests: No results for input(s): TSH, T4TOTAL, FREET4, T3FREE, THYROIDAB in the last 72 hours. Anemia Panel: No results for input(s): VITAMINB12, FOLATE, FERRITIN, TIBC, IRON, RETICCTPCT in the last 72 hours. Urine analysis:    Component Value Date/Time   COLORURINE AMBER (A) 07/27/2020 1050   APPEARANCEUR CLOUDY (A) 07/27/2020 1050   LABSPEC 1.020 07/27/2020 1050   PHURINE 5.0 07/27/2020 1050   GLUCOSEU 150 (A) 07/27/2020 1050   HGBUR LARGE (A) 07/27/2020 1050   BILIRUBINUR SMALL (A) 07/27/2020 1050   KETONESUR NEGATIVE 07/27/2020 1050   PROTEINUR >=300 (A) 07/27/2020 1050    NITRITE NEGATIVE 07/27/2020 1050   LEUKOCYTESUR NEGATIVE 07/27/2020 1050   Sepsis Labs: @LABRCNTIP (procalcitonin:4,lacticidven:4)  ) Recent Results (from the past 240 hour(s))  Blood Culture (routine x 2)     Status: None (Preliminary result)   Collection Time: 07/26/20 11:45 PM   Specimen: BLOOD  Result Value Ref Range Status   Specimen Description BLOOD LEFT HAND  Final   Special Requests   Final    BOTTLES DRAWN AEROBIC AND ANAEROBIC Blood Culture adequate volume   Culture   Final    NO GROWTH 4 DAYS Performed at [facility], [place] [address], [place], [place] [address]    Report Status PENDING  Incomplete  Blood Culture (routine x 2)     Status: None (Preliminary result)   Collection Time: 07/27/20 12:04 AM   Specimen: BLOOD  Result Value Ref Range Status   Specimen Description BLOOD LEFT ARM  Final   Special Requests   Final    BOTTLES DRAWN AEROBIC AND ANAEROBIC Blood Culture adequate volume   Culture   Final    NO GROWTH 4 DAYS Performed at [facility], [place] [address], [place], [place] [address]    Report Status PENDING  Incomplete  SARS Coronavirus 2 by RT PCR (hospital order, performed in [facility] hospital lab) Nasopharyngeal Nasopharyngeal Swab     Status: None   Collection Time: 07/27/20  2:59 AM   Specimen: Nasopharyngeal Swab  Result Value Ref Range Status   SARS Coronavirus 2 NEGATIVE NEGATIVE Final    Comment: (NOTE) SARS-CoV-2 target nucleic acids are NOT DETECTED.  The SARS-CoV-2 RNA is generally detectable in upper and lower respiratory specimens during the acute phase of infection. The lowest concentration of SARS-CoV-2 viral copies this assay can detect is 250 copies / mL. A negative result does not preclude SARS-CoV-2 infection and should not be used as the sole basis for treatment or other patient management decisions.  A negative result may occur with improper specimen collection / handling, submission of specimen other than  nasopharyngeal swab, presence of viral mutation(s) within the areas targeted by this assay, and inadequate number of viral copies (<250 copies / mL). A negative result must be combined with clinical observations, patient history, and epidemiological information.  Fact Sheet for Patients:   StrictlyIdeas.no  Fact Sheet for Healthcare Providers: BankingDealers.co.za  This test is not yet approved or  cleared by the [place] FDA and has been authorized for detection and/or  diagnosis of SARS-CoV-2 by FDA under an Emergency Use Authorization (EUA).  This EUA will remain in effect (meaning this test can be used) for the duration of the COVID-19 declaration under Section 564(b)(1) of the Act, 21 U.S.C. section 360bbb-3(b)(1), unless the authorization is terminated or revoked sooner.  Performed at [facility], [place] [address], [place], [place] [address]   Urine culture     Status: Abnormal   Collection Time: 07/27/20 10:52 AM   Specimen: In/Out Cath Urine  Result Value Ref Range Status   Specimen Description IN/OUT CATH URINE  Final   Special Requests   Final    NONE Performed at [facility], [place] [address], [place], [place] [address]    Culture 1,000 COLONIES/mL STAPHYLOCOCCUS EPIDERMIDIS (A)  Final   Report Status 07/29/2020 FINAL  Final  MRSA PCR Screening     Status: None   Collection Time: 07/27/20 10:19 PM   Specimen: Nasal Mucosa; Nasopharyngeal  Result Value Ref Range Status   MRSA by PCR NEGATIVE NEGATIVE Final    Comment:        The GeneXpert MRSA Assay (FDA approved for NASAL specimens only), is one component of a comprehensive MRSA colonization surveillance program. It is not intended to diagnose MRSA infection nor to guide or monitor treatment for MRSA infections. Performed at [facility], [place] [address], [place], [place] [address]   Aerobic/Anaerobic Culture (surgical/deep wound)     Status:  None (Preliminary result)   Collection Time: 07/29/20  9:53 AM   Specimen: Bone; Tissue  Result Value Ref Range Status   Specimen Description BONE  Final   Special Requests LEFT CALCANEAL BONE SPEC A  Final   Gram Stain   Final    NO WBC SEEN NO ORGANISMS SEEN Performed at [facility], [place] [address] [place], [place] [address]    Culture PENDING  Incomplete   Report Status PENDING  Incomplete      Studies: No results found.  Scheduled Meds: . aspirin EC  81 mg Oral Daily  . Chlorhexidine Gluconate Cloth  6 each Topical Q0600  . clopidogrel  75 mg Oral Q breakfast  . collagenase   Topical Daily  . darbepoetin (ARANESP) injection - DIALYSIS  60 mcg Intravenous Q Fri-HD  . ferric citrate  210 mg Oral TID WC  . gabapentin  300 mg Oral Daily  . heparin  5,000 Units Subcutaneous Q8H  . insulin aspart  0-15 Units Subcutaneous TID WC  . insulin aspart  0-5 Units Subcutaneous QHS  . magnesium oxide  400 mg Oral TID  . multivitamin  1 tablet Oral Daily  . nicotine  14 mg Transdermal Daily  . omeprazole  40 mg Oral Q0600  . rosuvastatin  10 mg Oral Daily    Continuous Infusions: . ceFEPime (MAXIPIME) IV Stopped (07/31/20 2345)  . vancomycin Stopped (07/31/20 2312)     LOS: 5 days     Kayleen Memos, MD [facility] Pager [phone]  If 7PM-7AM, please contact night-coverage [email or web address] Password [email or web address] 08/01/2020, 12:10 PM

## 2020-08-01 NOTE — TOC Progression Note (Signed)
Transition of Care [facility]) - Progression Note    Patient Details  Name: Duane Hall MRN: [id number] Date of Birth: [birth year]  [facility] CM/SW Contact  Jacalyn Lefevre Edson Snowball, RN Phone Number: 08/01/2020, 2:18 PM  Clinical Narrative:     Received referral for home health RN. NCM went to room to speak to patient. Patient had blanket over his head and would not respond to NCM calling his name x 3 . Patient was moving under the blanket.    NCM called every home health agency listed for his zip code. No agency available to accept referral at this time due to having no staff or not in network with patient's insurance.   Called patient's wife Belenda Cruise. Explained above. Belenda Cruise voiced understanding. Belenda Cruise stated [facility] have been trying for a month to arrange home health but had same results. Belenda Cruise will be at hospital today at 4 pm to learn wound care and transport patient home.   Bedside Remo Lipps and Dr [place] Crane aware of above.   Expected Discharge Plan: [place] Barriers to Discharge: No Barriers Identified  Expected Discharge Plan and Services Expected Discharge Plan: [place]   Discharge Planning Services: CM Consult Post Acute Care Choice: [place] arrangements for the past 2 months: Single Family Home Expected Discharge Date: 08/01/20                 DME Agency: NA                   Social Determinants of Health ([place]) Interventions    Readmission Risk Interventions Readmission Risk Prevention Plan 06/20/2020  Transportation Screening Complete  PCP or Specialist Appt within 3-5 Days Complete  HRI or [place] Complete  Social Work Consult for [place] Planning/Counseling Complete  Palliative Care Screening Not Applicable  Medication Review Press photographer) Complete  Some recent data might be hidden

## 2020-08-01 NOTE — Progress Notes (Addendum)
[facility] Progress Note   Subjective:   Patient seen and examined at bedside.  Reports dialysis went well, SBP remained in the 120-130s.  Did not sleep well because the bed is not comfortable.  Admits to LE edema.  Denies SOB, orthopnea, CP, n/v/d, weakness and fatigue.  States he is ready to go home and does not want to be in here another day.   Objective Vitals:   07/31/20 2330 07/31/20 2357 08/01/20 0046 08/01/20 0622  BP: 124/64 130/71 119/68 (!) 143/69  Pulse: 79 84 88 86  Resp:  16 16 18   Temp:  98.3 F (36.8 C) 98.5 F (36.9 C) 98.1 F (36.7 C)  TempSrc:  Oral Oral Oral  SpO2:  98% 100% 100%  Weight:  73.6 kg    Height:       Physical Exam General:chronically ill appearing, pale male in NAD Heart:RRR Lungs:CTAB Abdomen:soft, NTND Extremities:1+ LE edema on LLE, trace on RLE, b/l heels/feet dressed Dialysis Access: RU AVF +b   Filed Weights   07/28/20 0430 07/31/20 2043 07/31/20 2357  Weight: 72.2 kg 73.6 kg 73.6 kg    Intake/Output Summary (Last 24 hours) at 08/01/2020 0820 Last data filed at 08/01/2020 0710 Gross per 24 hour  Intake 1011.22 ml  Output 1000 ml  Net 11.22 ml    Additional Objective Labs: Basic Metabolic Panel: Recent Labs  Lab 07/28/20 2253 07/29/20 0330 07/31/20 2100  NA 133* 137 136  K 3.3* 3.7 4.2  CL 95* 100 100  CO2 26 29 27   GLUCOSE 247* 167* 155*  BUN 53* 17 44*  CREATININE 6.67* 3.30* 6.96*  CALCIUM 8.4* 8.1* 8.7*  PHOS 3.1  --   --    Liver Function Tests: Recent Labs  Lab 07/26/20 2345 07/28/20 2253 07/31/20 2100  AST 53*  --  42*  ALT 24  --  43  ALKPHOS 121  --  115  BILITOT 0.7  --  0.7  PROT 6.2*  --  5.6*  ALBUMIN 3.0* 2.5* 2.6*   CBC: Recent Labs  Lab 07/27/20 0639 07/27/20 0639 07/28/20 1605 07/28/20 1605 07/28/20 2253 07/29/20 1752 07/31/20 2100  WBC 10.0   < > 6.6   < > 5.1 6.1 6.7  NEUTROABS  --   --  4.6  --   --  4.2 4.4  HGB 9.9*   < > 9.9*   < > 9.0* 9.7* 9.1*  HCT 29.1*    < > 29.1*   < > 26.8* 29.6* 27.9*  MCV 93.6  --  91.5  --  92.1 92.8 95.9  PLT 142*   < > 150   < > 158 157 184   < > = values in this interval not displayed.   Blood Culture    Component Value Date/Time   SDES BONE 07/29/2020 0953   SPECREQUEST LEFT CALCANEAL BONE SPEC A 07/29/2020 0953   CULT PENDING 07/29/2020 0953   REPTSTATUS PENDING 07/29/2020 0953    CBG: Recent Labs  Lab 07/31/20 0802 07/31/20 1142 07/31/20 1704 08/01/20 0046 08/01/20 0739  GLUCAP 216* 231* 274* 157* 358*    Medications: . ceFEPime (MAXIPIME) IV Stopped (07/31/20 2345)  . vancomycin Stopped (07/31/20 2312)   . aspirin EC  81 mg Oral Daily  . Chlorhexidine Gluconate Cloth  6 each Topical Q0600  . clopidogrel  75 mg Oral Q breakfast  . collagenase   Topical Daily  . darbepoetin (ARANESP) injection - DIALYSIS  60 mcg Intravenous Q  Fri-HD  . ferric citrate  210 mg Oral TID WC  . gabapentin  300 mg Oral Daily  . heparin  5,000 Units Subcutaneous Q8H  . insulin aspart  0-15 Units Subcutaneous TID WC  . insulin aspart  0-5 Units Subcutaneous QHS  . magnesium oxide  400 mg Oral TID  . multivitamin  1 tablet Oral Daily  . nicotine  14 mg Transdermal Daily  . omeprazole  40 mg Oral Q0600  . rosuvastatin  10 mg Oral Daily    Dialysis Orders: [facility]- NxStage 3hrs, BFR400,DFR 800,EDW 70kg,2K  Access:R. Upper AV fistula Heparin3000 Midodrine 10mg  qHD  Assessment/Plan: 1. Sepsis likely d/t diabetic foot ulcer and vascular insufficiency of extremity - BC sent and pending- negative to date. urine culture appears negative as well.  Wound care following. IV Vanc per pharm. Left heelMRI concerning for osteo - s/pbone bx 7/31, results pending. Per podiatry would like to wait for final BC/Bx results to determine definitive plan for wounds/limb salvage.  2. ESRD- Nx stage pt - MWF here - K 4.2 pre HD.  HD tolerated overnight.  Next HD 8/4.  3. Hypotension/volume- Midodrine with HD  stopped with Dr. Shelva Majestic permission b/c he says it "doesn't work" and gives him urinary urgency- Does not want to continue. BP limiting UF net UF 594 ml Friday - lowest BP 74/44 but UF 1L yesterday with BP in goal.  Doubt weights correct, pre and post weights the same although 1L removed with HD.LE edema on exam.  Need standing weight post dialysis to determine dry weight.  4. Anemiaof CKD- Hgb 9.1.No recent ESA dose noted in chart. Started Aranesp 60 7/30 (q Friday) 5. Secondary Hyperparathyroidism -Ca and phos in goal.Continue Auryxia.  Not on VDRA. 6. Nutrition- Albumin low. Continue heart health/carb modified diet w/ fluid restirction. Follow K, if becomes elevated, change to renal diet.  7. Tobacco abuse - Started on nicotine patch per primary. 8. COPD - stable, home meds resumed per primary.  9. Diabetes mellitus - sliding scale insulin ordered, monitored by primary  Jen Mow, PA-C [place] 08/01/2020,8:20 AM  LOS: 5 days   I have seen and examined this patient and agree with plan and assessment in the above note with renal recommendations/intervention highlighted.  Stable for discharge from our standpoint but will need to coordinate IV vanco with home therapies. Broadus John A Kameelah Minish,MD 08/01/2020 10:44 AM

## 2020-08-01 NOTE — Discharge Instructions (Signed)
Osteomyelitis, Adult  Bone infections (osteomyelitis) occur when bacteria or other germs get inside a bone. This can happen if you have an infection in another part of your body that spreads through your blood. Germs from your skin or from outside of your body can also cause this type of infection if you have a wound or a broken bone (fracture) that breaks the skin. Bone infections need to be treated quickly to prevent bone damage and to prevent the infection from spreading to other areas of your body. What are the causes? Most bone infections are caused by bacteria. They can also be caused by other germs, such as viruses and funguses. What increases the risk? You are more likely to develop this condition if you:  Recently had surgery, especially bone or joint surgery.  Have a long-term (chronic) disease, such as: ? Diabetes. ? HIV (human immunodeficiency virus). ? Rheumatoid arthritis. ? Sickle cell anemia. ? Kidney disease that requires dialysis.  Are aged 60 years or older.  Have a condition or take medicines that block or weaken your body's defense system (immune system).  Have a condition that reduces your blood flow.  Have an artificial joint.  Have had a joint or bone repaired with plates or screws (surgical hardware).  Use IV drugs.  Have a central line for IV access.  Have had trauma, such as stepping on a nail or a broken bone that came through the skin. What are the signs or symptoms? Symptoms vary depending on the type and location of your infection. Common symptoms of bone infections include:  Fever and chills.  Skin redness and warmth.  Swelling.  Pain and stiffness.  Drainage of fluid or pus near the infection. How is this diagnosed? This condition may be diagnosed based on:  Your symptoms and medical history.  A physical exam.  Tests, such as: ? A sample of tissue, fluid, or blood taken to be examined under a microscope. ? Pus or discharge swabbed  from a wound for testing to identify germs and to determine what type of medicine will kill them (culture and sensitivity). ? Blood tests.  Imaging studies. These may include: ? X-rays. ? MRI. ? CT scan. ? Bone scan. ? Ultrasound. How is this treated? Treatment for this condition depends on the cause and type of infection. Antibiotic medicines are usually the first treatment for a bone infection. This may be done in a hospital at first. You may have to continue IV antibiotics at home or take antibiotics by mouth for several weeks after that. Other treatments may include surgery to remove:  Dead or dying tissue from a bone.  An infected artificial joint.  Infected plates or screws that were used to repair a broken bone. Follow these instructions at home: Medicines   Take over-the-counter and prescription medicines only as told by your health care provider.  Take your antibiotic medicine as told by your health care provider. Do not stop taking the antibiotic even if you start to feel better.  Follow instructions from your health care provider about how to take IV antibiotics at home. You may need to have a nurse come to your home to give you the IV antibiotics. General instructions   Ask your health care provider if you have any restrictions on your activities.  If directed, put ice on the affected area: ? Put ice in a plastic bag. ? Place a towel between your skin and the bag. ? Leave the ice on for 20   minutes, 2-3 times a day.  Wash your hands often with soap and water. If soap and water are not available, use hand sanitizer.  Do not use any products that contain nicotine or tobacco, such as cigarettes and e-cigarettes. These can delay bone healing. If you need help quitting, ask your health care provider.  Keep all follow-up visits as told by your health care provider. This is important. Contact a health care provider if:  You develop a fever or chills.  You have  redness, warmth, pain, or swelling that returns after treatment. Get help right away if:  You have rapid breathing or you have trouble breathing.  You have chest pain.  You cannot drink fluids or make urine.  The affected area swells, changes color, or turns blue.  You have numbness or severe pain in the affected area. Summary  Bone infections (osteomyelitis) occur when bacteria or other germs get inside a bone.  You may be more likely to get this type of infection if you have a condition, such as diabetes, that lowers your ability to fight infection or increases your chances of getting an infection.  Most bone infections are caused by bacteria. They can also be caused by other germs, such as viruses and funguses.  Treatment for this condition usually starts with taking antibiotics. Further treatment depends on the cause and type of infection. This information is not intended to replace advice given to you by your health care provider. Make sure you discuss any questions you have with your health care provider. Document Revised: 01/01/2018 Document Reviewed: 12/25/2017 Elsevier Patient Education  2020 Elsevier Inc.  

## 2020-08-02 LAB — HEPATITIS B SURFACE ANTIBODY, QUANTITATIVE: Hep B S AB Quant (Post): <3.1 m[IU]/mL — ABNORMAL LOW

## 2020-08-03 ENCOUNTER — Telehealth: Payer: Self-pay | Admitting: Podiatry

## 2020-08-03 NOTE — Telephone Encounter (Signed)
I spoke with Duane Hall - [facility] she states the declined due to staffing.

## 2020-08-03 NOTE — Telephone Encounter (Signed)
I'd like to continue to see him here each week for now. How long is she out of town? She was having issues even doing it herself. I don't know if they sent any additional referrals during his recent hospitalization

## 2020-08-03 NOTE — Telephone Encounter (Signed)
Duane Hall at [facility] ([facility], [facility]) wanted to follow up on the status of home health for the PT. If you are not able to return the call by 4 pm, she asked that you call Monday.

## 2020-08-03 NOTE — Telephone Encounter (Signed)
Faxed required form, SnapShot with [employer], orders for continued local wound care, with Santyl on the left heel and dry sterile dressings, and dry sterile dressings in the right heel and demographics to [facility].

## 2020-08-03 NOTE — Telephone Encounter (Signed)
[facility] asked if pt had been accepted into home health center. I told her I had not heard back from [facility].

## 2020-08-03 NOTE — Telephone Encounter (Signed)
I spoke with [facility] in Telephone Call 08/03/2020 and she states they are unable to accept pt due to staffing.

## 2020-08-04 ENCOUNTER — Ambulatory Visit: Payer: BC Managed Care – PPO | Admitting: Podiatry

## 2020-08-04 ENCOUNTER — Other Ambulatory Visit: Payer: Self-pay

## 2020-08-04 DIAGNOSIS — L89620 Pressure ulcer of left heel, unstageable: Secondary | ICD-10-CM

## 2020-08-04 DIAGNOSIS — L89610 Pressure ulcer of right heel, unstageable: Secondary | ICD-10-CM | POA: Diagnosis not present

## 2020-08-04 MED ORDER — DOXYCYCLINE HYCLATE 100 MG PO TABS: 100.0000 mg | ORAL_TABLET | Freq: Two times a day (BID) | ORAL | 0 refills | Status: DC

## 2020-08-04 NOTE — Telephone Encounter (Addendum)
Left message informing pt's wife, Belenda Cruise that we had sent in another [facility] referral but had not heard from [facility], and the hospital had put in for [facility] also without results, Dr. Sherryle Lis would like to see pt at least once weekly for dressing changes, and we would like to know how long she would be out of town.

## 2020-08-04 NOTE — Telephone Encounter (Signed)
Pt's wife, Belenda Cruise called states all is contingent on pt being able to drive to our office and dialysis and she will discuss with Dr. Sherryle Lis today at 4:00pm, her vacation would be from 08/20/2020 - 08/26/2020.

## 2020-08-05 NOTE — Progress Notes (Signed)
Subjective:  Patient ID: Duane Hall, male    DOB: 03-Dec-1956,  MRN: [id number]  Chief Complaint  Patient presents with  . Wound Check    Bilateral heels. Pt's wife stated, "He's doing well. The R heel has almost healed - the wound is much smaller. I would like to know if we can use band-aids instead of gauze when he's resting. The L heel is about the same. The drainage has resolved. He had a biopsy on Saturday". No fever/chills/N&V/pus/foul odor. Pt stated, "I would describe the pain as 'mild'".    64 y.o. male presents with the above complaint. History confirmed with patient.   Here for his first visit post hospital discharge.  He has been taking the doxycycline.  His wife is here with him today.  Objective:  Physical Exam: Minimal edema and erythema bilaterally.  The left heel eschar is stable, has no signs of infection and minimal drainage.  The right posterior heel eschar is also stable, is dry and has minimal drainage.  No signs of infection here either.  Biopsy incision site healing well with suture intact   Component7 d ago Specimen DescriptionBONE Special RequestsLEFT CALCANEAL BONE SPEC A Gram StainNO WBC SEEN  NO ORGANISMS SEEN  CultureRARE STAPHYLOCOCCUS SIMULANS  CRITICAL RESULT CALLED TO, READ BACK BY AND VERIFIED WITH: RN [facility] AT 1230 ON 08/01/20 BY T.SAAD  NO ANAEROBES ISOLATED  CULTURE REINCUBATED FOR BETTER GROWTH  Performed at [facility], [place] [address], [place], [place] [address]  Report StatusPENDING  SURGICAL PATHOLOGYSURGICAL PATHOLOGY  CASE: [phone]  PATIENT: Duane Hall  Surgical Pathology Report      Clinical History: osteomyelitis left calcaneus (cm)      FINAL MICROSCOPIC DIAGNOSIS:   A. BONE, LEFT CALCANEAL, BIOPSY:  - Benign trabecular bone.  - No inflammation or evidence of malignancy.    GROSS DESCRIPTION:  Received fresh is a 1.1 x 0.25 cm core of pink-white firm tissue which  is submitted in 1 block following  decalcification in Immunocal. SW  07/31/2020   Final Diagnosis performed by Claudette Laws, MD.   Electronically signed  08/01/2020  Technical and / or Professional components performed at [facility]. [facility], [place] [address], [place], [place] [address].   Immunohistochemistry Technical component (if applicable) was performed  at [facility]. [address] [place],  [place],  [address].   IMMUNOHISTOCHEMISTRY DISCLAIMER (if applicable):  Some of these immunohistochemical stains may have been developed and the  performance characteristics determine by [facility]. Some  may not have been cleared or approved by the U.S. Food and Drug  Administration. The FDA has determined that such clearance or approval  is not necessary. This test is used for clinical purposes. It should not  be regarded as investigational or for research. This laboratory is  certified under the [place]  (CLIA-88) as qualified to perform high complexity clinical laboratory  testing.  The controls stained appropriately.    Assessment:   1. Pressure injury of left heel, unstageable ([place])   2. Pressure injury of right heel, unstageable ([place])      Plan:  Patient was evaluated and treated and all questions answered.  -No debridement was performed today -Continue local wound care at home with Santyl and wet-to-dry dressings daily or every other day.  Advised that they can do a window type tape dressing on the right side if that is more comfortable. -May bear weight  on the right foot, toe-touch weightbearing on the left foot -Continue doxycycline.  An additional 2 weeks was sent -His bone biopsy on the left heel was negative with benign cancellous bone.  It did have late growth of staph stimulants.  This is a minimally pathogenic organism however given his significant comorbidities and immunocompromise state this could potentially be  concerning for early osteomyelitis.  I do not think this is a contaminant but it is possible.  Would like him to see infectious disease for further recommendations regarding antibiotics.  He is adamant that we pursue limb salvage at all costs and is resistant to amputation below the knee currently. -With the current appearance of his wounds I do not think that urgent amputation or partial heel resection with flap coverage would be advisable at this point, and I am hopeful that they will continue to improve with local wound care -I will see him again in 2-1/2 weeks, I will see him twice that week because his wife is out of town and we will change his dressings on Tuesday and Thursday here in the office as home health care agencies are understaffed currently -Strict return precautions were given.  Duane Hall, DPM      Return in 18 days (on 08/22/2020) for wound re-check.

## 2020-08-07 ENCOUNTER — Telehealth: Payer: Self-pay | Admitting: *Deleted

## 2020-08-07 DIAGNOSIS — F17200 Nicotine dependence, unspecified, uncomplicated: Secondary | ICD-10-CM

## 2020-08-07 DIAGNOSIS — L89620 Pressure ulcer of left heel, unstageable: Secondary | ICD-10-CM

## 2020-08-07 DIAGNOSIS — N186 End stage renal disease: Secondary | ICD-10-CM

## 2020-08-07 DIAGNOSIS — L89610 Pressure ulcer of right heel, unstageable: Secondary | ICD-10-CM

## 2020-08-07 DIAGNOSIS — I739 Peripheral vascular disease, unspecified: Secondary | ICD-10-CM

## 2020-08-07 LAB — AEROBIC/ANAEROBIC CULTURE W GRAM STAIN (SURGICAL/DEEP WOUND): Gram Stain: NONE SEEN

## 2020-08-07 NOTE — Telephone Encounter (Signed)
Faxed required form, clinicals and demographics to Infectious Disease. 

## 2020-08-07 NOTE — Telephone Encounter (Signed)
Dr. Sherryle Lis ordered 2x2, 4x4 sterile gauze, 1/2" paper tape for daily use over 90 days for the left and right heel ulcers, from [facility]. Faxed required form, clinicals and demographics to [facility].

## 2020-08-07 NOTE — Telephone Encounter (Signed)
-----   Message from [name], DPM sent at 08/05/2020  2:36 PM EDT ----- Regarding: ID Referral Hi [name],   Can you send a referral to infectious disease for a left heel ulcer with concern for osteomyelitis? I recently performed a bone biopsy in the hospital which had negative pathology and had a late bone culture growth and he was discharged prior to this resulting so they did not see him. The question I have for them is if we should continue oral antibiotics vs PICC line therapy (which I would prefer that they manage instead of [place] if that is advised).  Let me know if you have any questions/concerns! I advised him it may be a few weeks before they can see him  Thanks! Quita Skye

## 2020-08-08 DIAGNOSIS — L89620 Pressure ulcer of left heel, unstageable: Secondary | ICD-10-CM | POA: Diagnosis not present

## 2020-08-11 DIAGNOSIS — I9589 Other hypotension: Secondary | ICD-10-CM | POA: Diagnosis not present

## 2020-08-11 DIAGNOSIS — M86172 Other acute osteomyelitis, left ankle and foot: Secondary | ICD-10-CM | POA: Diagnosis not present

## 2020-08-11 DIAGNOSIS — M545 Low back pain: Secondary | ICD-10-CM | POA: Diagnosis not present

## 2020-08-11 DIAGNOSIS — R58 Hemorrhage, not elsewhere classified: Secondary | ICD-10-CM | POA: Diagnosis not present

## 2020-08-12 DIAGNOSIS — I517 Cardiomegaly: Secondary | ICD-10-CM | POA: Insufficient documentation

## 2020-08-12 DIAGNOSIS — M545 Low back pain, unspecified: Secondary | ICD-10-CM | POA: Insufficient documentation

## 2020-08-16 ENCOUNTER — Encounter: Payer: Self-pay | Admitting: Endocrinology

## 2020-08-16 ENCOUNTER — Other Ambulatory Visit: Payer: Self-pay | Admitting: Endocrinology

## 2020-08-16 ENCOUNTER — Other Ambulatory Visit: Payer: Self-pay

## 2020-08-16 ENCOUNTER — Ambulatory Visit: Payer: BC Managed Care – PPO | Admitting: Endocrinology

## 2020-08-16 VITALS — BP 142/70 | HR 88 | Ht 69.0 in | Wt 159.2 lb

## 2020-08-16 DIAGNOSIS — E1122 Type 2 diabetes mellitus with diabetic chronic kidney disease: Secondary | ICD-10-CM

## 2020-08-16 DIAGNOSIS — Z992 Dependence on renal dialysis: Secondary | ICD-10-CM | POA: Diagnosis not present

## 2020-08-16 DIAGNOSIS — N186 End stage renal disease: Secondary | ICD-10-CM

## 2020-08-16 NOTE — Patient Instructions (Addendum)
good diet and exercise significantly improve the control of your diabetes.  please let me know if you wish to be referred to a dietician.  high blood sugar is very risky to your health.  you should see an eye doctor and dentist every year.  It is very important to get all recommended vaccinations.  Controlling your blood pressure and cholesterol drastically reduces the damage diabetes does to your body.  Those who smoke should quit.  Please discuss these with your doctor.  check your blood sugar once a day.  vary the time of day when you check, between before the 3 meals, and at bedtime.  also check if you have symptoms of your blood sugar being too high or too low.  please keep a record of the readings and bring it to your next appointment here (or you can bring the meter itself).  You can write it on any piece of paper.  please call us sooner if your blood sugar goes below 70, or if you have a lot of readings over 200. Blood tests are requested for you today.  We'll let you know about the results.  Based on the results, we may need to add "Novolog."   We will need to take this complex situation in stages.   Please come back for a follow-up appointment in 6 weeks.

## 2020-08-16 NOTE — Progress Notes (Signed)
Subjective:    Patient ID: Duane Hall, male    DOB: [birth year], 64 y.o.   MRN: [id number]  HPI pt is referred by Dr Joelyn Oms, for diabetes.  Wife provides most hx, due to pt's poor overall health. Pt states DM was dx'ed in approx 6283; it is complicated by ESRD, foot ulcers, GP, PAD, and PDR; he has never been on insulin; pt says his diet is good, but exercise is limited by foot ulcers; he has never had pancreatitis, pancreatic surgery, severe hypoglycemia or DKA.   He takes glipizide only.  He says cbg varies from 140-390.  He stopped Trulicity, due to GP.  Wife says they can take insulin if needed.  He eats 2 meals per day.   Past Medical History:  Diagnosis Date  . Asthma   . Cancer ([place])   . Cataract   . CKD (chronic kidney disease), stage III   . Diabetes mellitus   . Glaucoma   . Vascular insufficiency 05/2020    Past Surgical History:  Procedure Laterality Date  . ABDOMINAL AORTOGRAM W/LOWER EXTREMITY N/A 06/19/2020   Procedure: ABDOMINAL AORTOGRAM W/LOWER EXTREMITY;  Surgeon: Waynetta Sandy, MD;  Location: [facility];  Service: Cardiovascular;  Laterality: N/A;  . AV FISTULA PLACEMENT Right 09/14/2019   Procedure: Right Arm Basilic Vein transposition;  Surgeon: Angelia Mould, MD;  Location: [facility];  Service: Vascular;  Laterality: Right;  . BONE BIOPSY Left 07/29/2020   Procedure: BONE BIOPSY;  Surgeon: [name], DPM;  Location: [facility];  Service: Podiatry;  Laterality: Left;  Need bone trephines and/or large bore Giamshidi  . COLONOSCOPY    . PERIPHERAL VASCULAR ATHERECTOMY Left 06/19/2020   Procedure: PERIPHERAL VASCULAR ATHERECTOMY;  Surgeon: Waynetta Sandy, MD;  Location: [facility];  Service: Cardiovascular;  Laterality: Left;  SFA  . UPPER GASTROINTESTINAL ENDOSCOPY  02/2019   Dr Benson Norway      Social History   Socioeconomic History  . Marital status: Married    Spouse name: Not on file  . Number of children: Not on  file  . Years of education: Not on file  . Highest education level: Not on file  Occupational History  . Occupation: Chief Financial Officer  Tobacco Use  . Smoking status: Current Every Day Smoker    Packs/day: 0.80    Types: Cigarettes  . Smokeless tobacco: Never Used  Vaping Use  . Vaping Use: Never used  Substance and Sexual Activity  . Alcohol use: No    Alcohol/week: 0.0 standard drinks  . Drug use: No  . Sexual activity: Not on file  Other Topics Concern  . Not on file  Social History Narrative   Married.    Social Determinants of Health   Financial Resource Strain:   . Difficulty of Paying Living Expenses: Not on file  Food Insecurity:   . Worried About Charity fundraiser in the Last Year: Not on file  . Ran Out of Food in the Last Year: Not on file  Transportation Needs:   . Lack of Transportation (Medical): Not on file  . Lack of Transportation (Non-Medical): Not on file  Physical Activity:   . Days of Exercise per Week: Not on file  . Minutes of Exercise per Session: Not on file  Stress:   . Feeling of Stress : Not on file  Social Connections:   . Frequency of Communication with Friends and Family: Not on file  . Frequency of  Social Gatherings with Friends and Family: Not on file  . Attends Religious Services: Not on file  . Active Member of Clubs or Organizations: Not on file  . Attends Archivist Meetings: Not on file  . Marital Status: Not on file  Intimate Partner Violence:   . Fear of Current or Ex-Partner: Not on file  . Emotionally Abused: Not on file  . Physically Abused: Not on file  . Sexually Abused: Not on file    Current Outpatient Medications on File Prior to Visit  Medication Sig Dispense Refill  . aspirin EC 81 MG EC tablet Take 1 tablet (81 mg total) by mouth daily. Swallow whole. 30 tablet 11  . clopidogrel (PLAVIX) 75 MG tablet Take 1 tablet (75 mg total) by mouth daily with breakfast. 30 tablet 3  . collagenase (SANTYL) ointment Apply  1 application topically daily. Apply to the left heel daily with a layer the thickness of a nickel 30 g 2  . doxycycline (VIBRA-TABS) 100 MG tablet Take 1 tablet (100 mg total) by mouth every 12 (twelve) hours for 14 days. 28 tablet 0  . ferric citrate (AURYXIA) 1 GM 210 MG(Fe) tablet Take 210 mg by mouth 3 (three) times daily with meals.     . gabapentin (NEURONTIN) 100 MG capsule Take 300 mg by mouth daily.    Marland Kitchen glipiZIDE (GLUCOTROL) 10 MG tablet Take 1 tablet (10 mg total) by mouth daily before breakfast. (Patient taking differently: Take 10 mg by mouth 2 (two) times daily. ) 14 tablet 0  . lidocaine-prilocaine (EMLA) cream Apply 1 application topically See admin instructions. Apply topically to port access one hour prior to dialysis on Sunday, Monday, Wednesday, Thursday    . Methoxy PEG-Epoetin Beta (MIRCERA IJ) Inject into the skin.    . multivitamin (RENA-VIT) TABS tablet Take 1 tablet by mouth daily.    . mupirocin ointment (BACTROBAN) 2 % Apply 1 application topically See admin instructions. Apply topically to port access after dialysis - Sunday, Monday, Wednesday, Thursday    . omeprazole (PRILOSEC) 40 MG capsule Take 40 mg by mouth daily.    . Probiotic Product (PROBIOTIC DAILY PO) Take 1 each by mouth.    . sennosides-docusate sodium (SENOKOT-S) 8.6-50 MG tablet Take 1 tablet by mouth daily as needed for constipation.     No current facility-administered medications on file prior to visit.    Allergies  Allergen Reactions  . Midodrine     Other reaction(s): see below    Family History  Problem Relation Age of Onset  . Cancer Father   . Diabetes Mother     BP (!) 142/70   Pulse 88   Ht 5\' 9"  (1.753 m)   Wt 159 lb 3.2 oz (72.2 kg)   SpO2 97%   BMI 23.51 kg/m     Review of Systems denies blurry vision, chest pain, sob, n/v, memory loss, and depression.  He has regained 30 lbs x 1 year     Objective:   Physical Exam VITAL SIGNS:  See vs page GENERAL: no distress.   In wheelchair NECK: There is no palpable thyroid enlargement.  No thyroid nodule is palpable.  No palpable lymphadenopathy at the anterior neck. LUNGS:  Clear to auscultation.   HEART:  Regular rate and rhythm without murmurs noted. Normal S1,S2.   CV: trace bilat leg edema.   EXT: both feet are bandaged (sees podiatry for foot ulcers).    Lab Results  Component Value Date  HGBA1C 7.2 (H) 07/27/2020   Fructosamine=535    Assessment & Plan:  Type 2 DM, with ESRD: uncontrolled.  add Humalog, 10 units twice a day (just before each meal Lean body habitus.  I told pt and wife he is at risk for evolving type 1  Patient Instructions  good diet and exercise significantly improve the control of your diabetes.  please let me know if you wish to be referred to a dietician.  high blood sugar is very risky to your health.  you should see an eye doctor and dentist every year.  It is very important to get all recommended vaccinations.  Controlling your blood pressure and cholesterol drastically reduces the damage diabetes does to your body.  Those who smoke should quit.  Please discuss these with your doctor.  check your blood sugar once a day.  vary the time of day when you check, between before the 3 meals, and at bedtime.  also check if you have symptoms of your blood sugar being too high or too low.  please keep a record of the readings and bring it to your next appointment here (or you can bring the meter itself).  You can write it on any piece of paper.  please call us sooner if your blood sugar goes below 70, or if you have a lot of readings over 200. Blood tests are requested for you today.  We'll let you know about the results.  Based on the results, we may need to add "Novolog."   We will need to take this complex situation in stages.   Please come back for a follow-up appointment in 6 weeks.

## 2020-08-17 ENCOUNTER — Other Ambulatory Visit: Payer: Self-pay | Admitting: Vascular Surgery

## 2020-08-17 ENCOUNTER — Other Ambulatory Visit: Payer: Self-pay | Admitting: Endocrinology

## 2020-08-17 LAB — TSH: TSH: 1.8 u[IU]/mL (ref 0.450–4.500)

## 2020-08-17 LAB — FRUCTOSAMINE: Fructosamine: 535 umol/L — ABNORMAL HIGH (ref 0–285)

## 2020-08-17 LAB — T4, FREE: Free T4: 1.15 ng/dL (ref 0.82–1.77)

## 2020-08-17 MED ORDER — INSULIN LISPRO (1 UNIT DIAL) 100 UNIT/ML (KWIKPEN): 10.0000 [IU] | PEN_INJECTOR | Freq: Two times a day (BID) | SUBCUTANEOUS | 11 refills | Status: DC

## 2020-08-18 ENCOUNTER — Other Ambulatory Visit: Payer: Self-pay | Admitting: Endocrinology

## 2020-08-18 ENCOUNTER — Other Ambulatory Visit: Payer: Self-pay

## 2020-08-18 ENCOUNTER — Telehealth: Payer: Self-pay | Admitting: Endocrinology

## 2020-08-18 DIAGNOSIS — N186 End stage renal disease: Secondary | ICD-10-CM

## 2020-08-18 DIAGNOSIS — Z992 Dependence on renal dialysis: Secondary | ICD-10-CM

## 2020-08-18 DIAGNOSIS — E1122 Type 2 diabetes mellitus with diabetic chronic kidney disease: Secondary | ICD-10-CM

## 2020-08-18 MED ORDER — FREESTYLE LIBRE READER DEVI: 1.0000 | 0 refills | Status: DC

## 2020-08-18 MED ORDER — FREESTYLE LIBRE 14 DAY SENSOR MISC: 1.0000 | 2 refills | Status: DC

## 2020-08-18 MED ORDER — NOVOLOG FLEXPEN 100 UNIT/ML ~~LOC~~ SOPN: 10.0000 [IU] | PEN_INJECTOR | Freq: Two times a day (BID) | SUBCUTANEOUS | 2 refills | Status: DC

## 2020-08-18 NOTE — Telephone Encounter (Signed)
Medication Refill Request  Did you call your pharmacy and request this refill first? Yes  . If patient has not contacted pharmacy first, instruct them to do so for future refills.  . Remind them that contacting the pharmacy for their refill is the quickest method to get the refill.  . Refill policy also stated that it will take anywhere between 24-72 hours to receive the refill.    Name of medication? Freestyle libre sensors  Is this a 90 day supply? yes  Name and location of pharmacy?  [id number] [place], [place] - [address] Phone:  [phone]  Fax:  [phone]

## 2020-08-18 NOTE — Telephone Encounter (Signed)
Called pt to clarify his request since this is not listed on his medication list. Pt confirmed he is not using it because insurance wouldn't cover. States he wants to try again. Advised, based on his current orders, he does not meet criteria and will need to pay out of pocket. After lengthy conversation re: specific criteria he MUST meet and that a PA will not be completed d/t not meeting criteria regardless of the fact that he does not want to stick his finger, pt agreed to send in Rx so he could determine Out of Pocket expense.  Outpatient Medication Detail   Disp Refills Start End   Continuous Blood Gluc Receiver (FREESTYLE LIBRE READER) DEVI 1 each 0 08/18/2020    Sig - Route: 1 each by Does not apply route See admin instructions. WILL NOT COMPLETE PA. DOES NOT MEET CRITERIA. WILL PAY OUT OF POCKET - Does not apply   Sent to pharmacy as: Continuous Blood Gluc Receiver (FREESTYLE LIBRE READER) Device   E-Prescribing Status: Receipt confirmed by pharmacy (08/18/2020 10:04 AM EDT)    Outpatient Medication Detail   Disp Refills Start End   Continuous Blood Gluc Sensor (FREESTYLE LIBRE 14 DAY SENSOR) MISC 2 each 2 08/18/2020    Sig - Route: 1 each by Does not apply route every 14 (fourteen) days. WILL NOT COMPLETE PA. DOES NOT MEET CRITERIA. WILL PAY OUT OF POCKET - Does not apply   Sent to pharmacy as: Continuous Blood Gluc Sensor (FREESTYLE LIBRE 14 DAY SENSOR) Misc   E-Prescribing Status: Receipt confirmed by pharmacy (08/18/2020 10:03 AM EDT)

## 2020-08-19 ENCOUNTER — Other Ambulatory Visit: Payer: Self-pay | Admitting: Podiatry

## 2020-08-20 DIAGNOSIS — I7300 Raynaud's syndrome without gangrene: Secondary | ICD-10-CM | POA: Diagnosis not present

## 2020-08-20 DIAGNOSIS — L8996 Pressure-induced deep tissue damage of unspecified site: Secondary | ICD-10-CM | POA: Diagnosis not present

## 2020-08-20 DIAGNOSIS — L89623 Pressure ulcer of left heel, stage 3: Secondary | ICD-10-CM | POA: Diagnosis not present

## 2020-08-21 NOTE — Telephone Encounter (Signed)
Dr. Sherryle Lis,  There is a requested prescription for Doxycycline. Did you want this medication refilled for this patient?

## 2020-08-22 ENCOUNTER — Other Ambulatory Visit: Payer: Self-pay

## 2020-08-22 ENCOUNTER — Ambulatory Visit: Payer: BC Managed Care – PPO | Admitting: Podiatry

## 2020-08-22 ENCOUNTER — Encounter: Payer: Self-pay | Admitting: Podiatry

## 2020-08-22 VITALS — Temp 96.5°F

## 2020-08-22 DIAGNOSIS — I739 Peripheral vascular disease, unspecified: Secondary | ICD-10-CM

## 2020-08-22 DIAGNOSIS — N186 End stage renal disease: Secondary | ICD-10-CM

## 2020-08-22 DIAGNOSIS — E1169 Type 2 diabetes mellitus with other specified complication: Secondary | ICD-10-CM

## 2020-08-22 DIAGNOSIS — L89613 Pressure ulcer of right heel, stage 3: Secondary | ICD-10-CM | POA: Diagnosis not present

## 2020-08-22 DIAGNOSIS — L89623 Pressure ulcer of left heel, stage 3: Secondary | ICD-10-CM | POA: Diagnosis not present

## 2020-08-22 DIAGNOSIS — F17200 Nicotine dependence, unspecified, uncomplicated: Secondary | ICD-10-CM

## 2020-08-22 NOTE — Telephone Encounter (Signed)
Refill sent, I'm seeing him today as well

## 2020-08-23 NOTE — Progress Notes (Signed)
Subjective:  Patient ID: Duane Hall, male    DOB: [birth year],  MRN: [id number]  Chief Complaint  Patient presents with  . Wound Check    Follow-up; Bilateral; back of heel; pt stated, "My feet feel the same as last time"; pt Diabetic Type 2; Sugar=did not check today; A1C=7.2 (07/27/20)    64 y.o. male presents with the above complaint. History confirmed with patient.   He is here for his second postoperative visit and follow-up and dressing change.  His wife is out of town and had change the dressing prior to leaving.  He said he feels quite well.  Objective:  Physical Exam: His overall condition appearance looks improved since his previous visits, he appears to have more energy today.  Minimal edema and erythema bilaterally.  The left heel eschar is stable, has no signs of infection and minimal drainage.  The superior edges of the eschar have begun to lift off and are loosened, debridement of this reveals healthy subcutaneous tissue with a fibrogranular base overall wound dimensions here are 8 cm x 6 cm x 0.2 cm.  The right posterior heel eschar has coalesced into a scab and following sharp debridement the underlying wound measures 0.5 cm x 0.4 cm x 0.3 cm and extends to subcutaneous tissue with a fibrogranular base.  No signs of infection of either wound.  Biopsy site has healed well with a suture intact.           Assessment:   1. Pressure injury of left heel, stage 3 ([place])   2. PAD (peripheral artery disease) ([place])   3. Pressure injury of right heel, stage 3 ([place])   4. Current smoker   5. ESRD (end stage renal disease) ([place])   6. Type 2 diabetes mellitus with other specified complication, without long-term current use of insulin ([place])      Plan:  Patient was evaluated and treated and all questions answered.  -Today debridement of the loose portions of both eschars was performed see procedure note below this was tolerated well.  Healthy fiber granular base is noted  below the wound bed with no exposure of the calcaneus or fascia. -Continue local wound care at home with Santyl and wet-to-dry dressings daily or every other day.  Advised that they can do a window type tape dressing on the right side if that is more comfortable. -May bear weight on the right foot, toe-touch weightbearing on the left foot -Continue doxycycline.  An additional 2 weeks was sent again today.  He is following up with infectious disease on Monday. -Return for second dressing change on 08/24/2020  Lanae Crumbly, DPM  Ulcer bilateral heel -Debridement as below. -Dressed with Santyl, DSD. -Continue off-loading with surgical shoe.  Procedure: Excisional Debridement of left heel wound Rationale: Removal of non-viable soft tissue from the wound to promote healing.  Anesthesia: none Pre-Debridement Wound Measurements: 5.0 cm x 1.0 cm x 0.1 cm  Post-Debridement Wound Measurements: 5.0 cm x 1.0 cm x 0.1 cm (the remaining eschar was not debrided and left intact) Type of Debridement: Sharp Excisional Tissue Removed: Non-viable soft tissue Depth of Debridement: subcutaneous tissue. Technique: Sharp excisional debridement to bleeding, viable wound base.  Dressing: Dry, sterile, compression dressing. Disposition: Patient tolerated procedure well.   Procedure: Excisional Debridement of right heel wound Rationale: Removal of non-viable soft tissue from the wound to promote healing.  Anesthesia: none Pre-Debridement Wound Measurements: 0.5 cm x 0.4 cm x 0.2 cm  Post-Debridement Wound Measurements: 0.5 cm  x 0.4 cm x 0.2 cm Type of Debridement: Sharp Excisional Tissue Removed: Non-viable soft tissue Depth of Debridement: subcutaneous tissue. Technique: Sharp excisional debridement to bleeding, viable wound base.  Dressing: Dry, sterile, compression dressing. Disposition: Patient tolerated procedure well.

## 2020-08-24 ENCOUNTER — Encounter: Payer: Self-pay | Admitting: Podiatry

## 2020-08-24 ENCOUNTER — Other Ambulatory Visit: Payer: Self-pay

## 2020-08-24 ENCOUNTER — Ambulatory Visit: Payer: BC Managed Care – PPO | Admitting: Podiatry

## 2020-08-24 DIAGNOSIS — L89623 Pressure ulcer of left heel, stage 3: Secondary | ICD-10-CM

## 2020-08-24 DIAGNOSIS — F17200 Nicotine dependence, unspecified, uncomplicated: Secondary | ICD-10-CM

## 2020-08-24 DIAGNOSIS — L89613 Pressure ulcer of right heel, stage 3: Secondary | ICD-10-CM

## 2020-08-24 DIAGNOSIS — N186 End stage renal disease: Secondary | ICD-10-CM

## 2020-08-24 DIAGNOSIS — E1169 Type 2 diabetes mellitus with other specified complication: Secondary | ICD-10-CM

## 2020-08-24 DIAGNOSIS — I739 Peripheral vascular disease, unspecified: Secondary | ICD-10-CM

## 2020-08-24 NOTE — Patient Instructions (Signed)
Continue Santyl with dressing changes every other day, with saline gauze and dry dressing over these. On the right heel, a mepilex bandage will suffice but if a gauze dressing is more comfortable then that is fine  There are 2 refills available on the Santyl. If these run out, please let me know and I will refill it

## 2020-08-25 NOTE — Progress Notes (Signed)
°  Subjective:  Patient ID: BEREN YNIGUEZ, male    DOB: Nov 09, 1956,  MRN: [id number]  Chief Complaint  Patient presents with   Foot Ulcer    bilateral heel ulcers, 1 week follow up    64 y.o. male presents with the above complaint. History confirmed with patient.   Here again today for dressing change and evaluation  Objective:  Physical Exam: Wounds appear stable and consistent with exam on 8/24.  No debridement necessary today.     Assessment:   1. Pressure injury of left heel, stage 3 ([place])   2. PAD (peripheral artery disease) ([place])   3. Pressure injury of right heel, stage 3 ([place])   4. Current smoker   5. ESRD (end stage renal disease) ([place])   6. Type 2 diabetes mellitus with other specified complication, without long-term current use of insulin ([place])      Plan:  Patient was evaluated and treated and all questions answered.  -No debridement performed today -Dressings were changed with Santyl and wet-to-dry dressings -He will follow up with me in 3 weeks. -Infectious disease consult is scheduled for next week -Continue doxycycline for now

## 2020-08-28 ENCOUNTER — Ambulatory Visit: Payer: BC Managed Care – PPO | Admitting: Internal Medicine

## 2020-08-28 ENCOUNTER — Encounter: Payer: Self-pay | Admitting: Internal Medicine

## 2020-08-28 ENCOUNTER — Other Ambulatory Visit: Payer: Self-pay | Admitting: Physician Assistant

## 2020-08-28 ENCOUNTER — Other Ambulatory Visit: Payer: Self-pay

## 2020-08-28 ENCOUNTER — Ambulatory Visit
Admission: RE | Admit: 2020-08-28 | Discharge: 2020-08-28 | Disposition: A | Discharge status: Home / Self Care | Payer: BC Managed Care – PPO | Source: Ambulatory Visit | Attending: Physician Assistant | Admitting: Physician Assistant

## 2020-08-28 ENCOUNTER — Encounter: Payer: Self-pay | Admitting: Endocrinology

## 2020-08-28 DIAGNOSIS — M545 Low back pain, unspecified: Secondary | ICD-10-CM

## 2020-08-28 DIAGNOSIS — M86172 Other acute osteomyelitis, left ankle and foot: Secondary | ICD-10-CM | POA: Diagnosis not present

## 2020-08-28 IMAGING — DX DG LUMBAR SPINE 2-3V
3 series · 3 of 3 positions shown · non-contrast
Comparison: [DATE] lumbar spine radiographs. [DATE] CT
abdomen pelvis.

CLINICAL DATA: Pain

EXAM:
LUMBAR SPINE - 2-3 VIEW

[dg lumbar spine 2-3 views (1 of 3)]
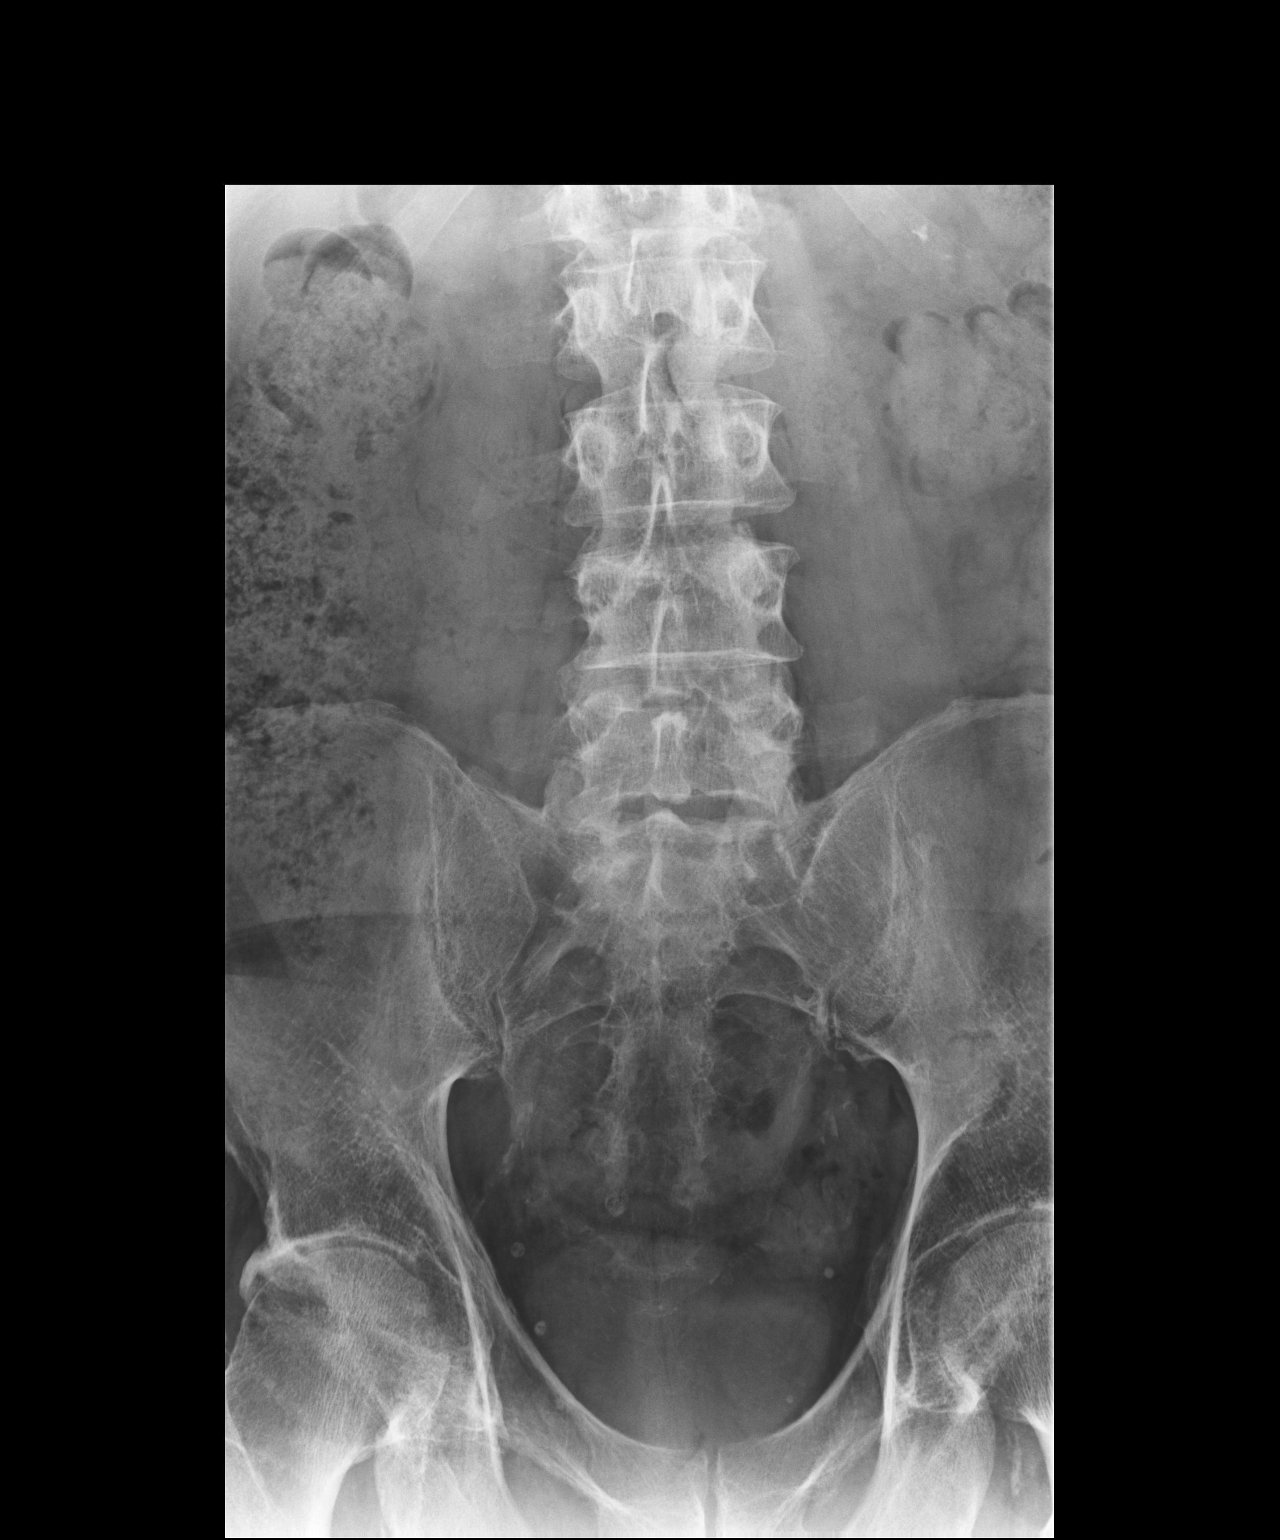

[dg lumbar spine 2-3 views (2 of 3)]
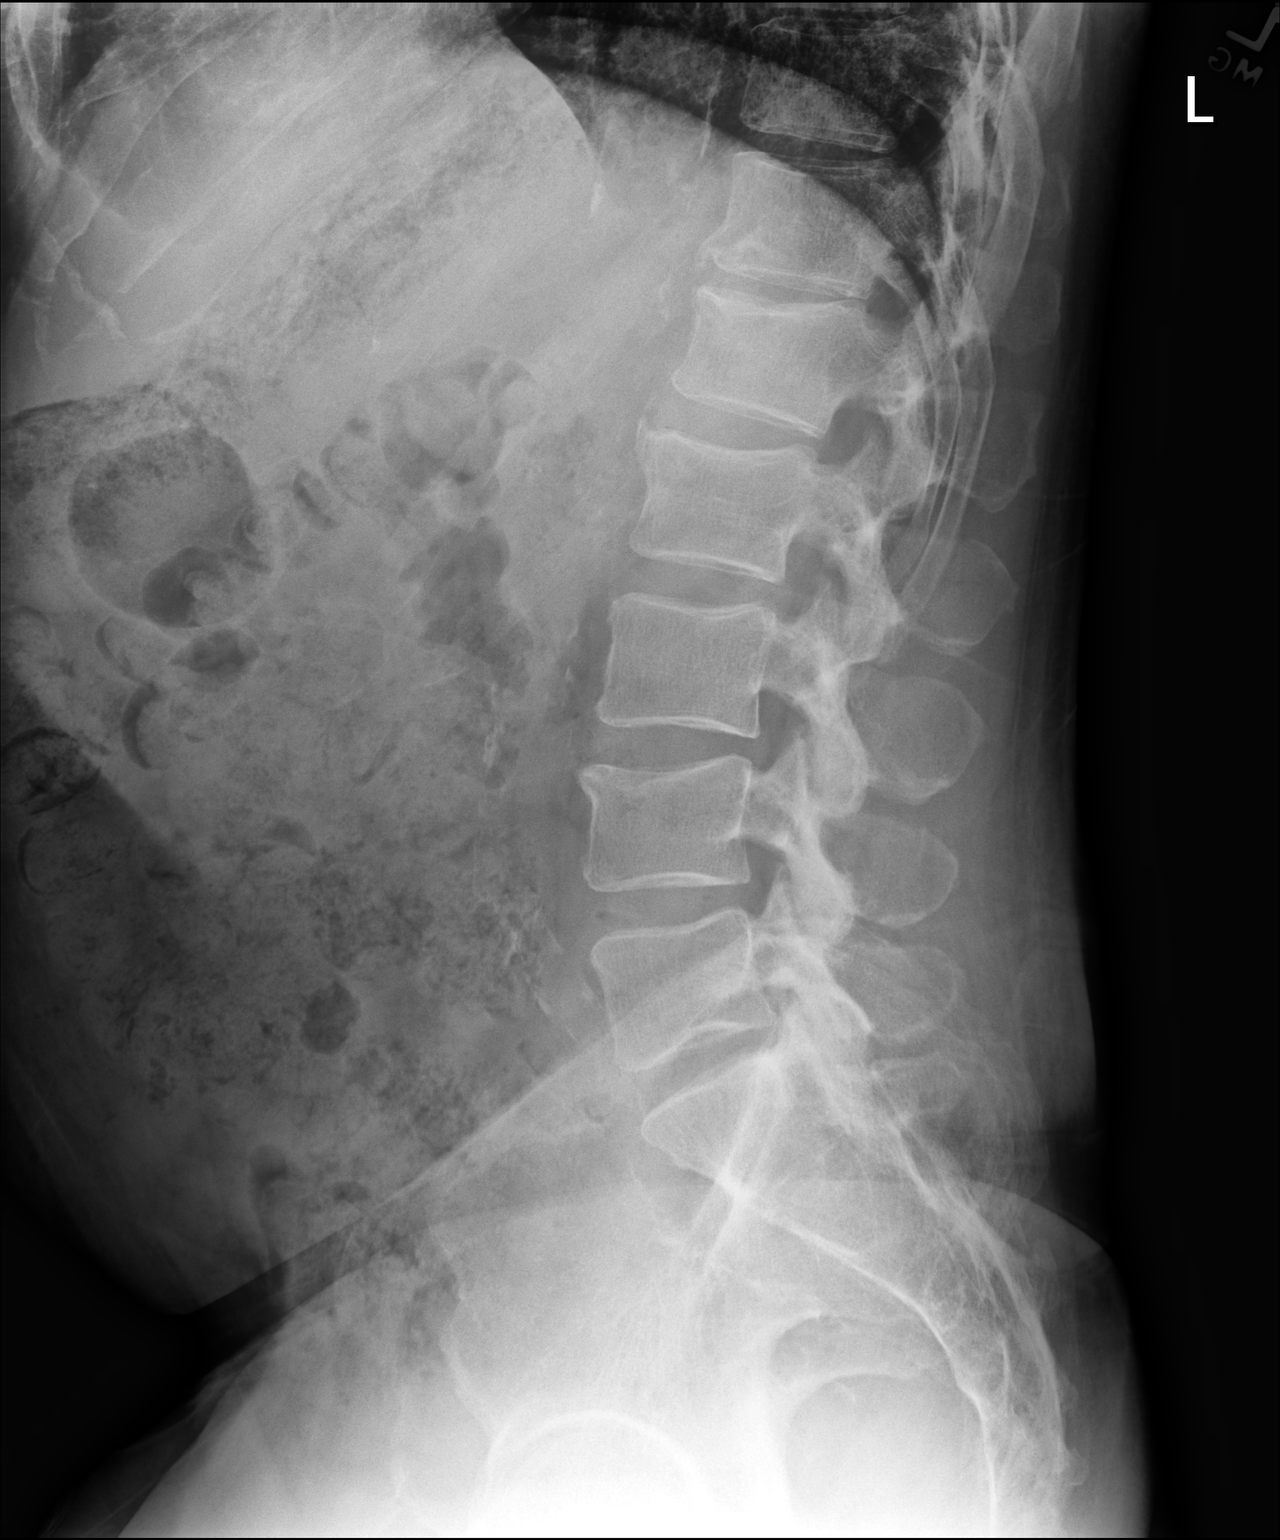

[dg lumbar spine 2-3 views (3 of 3)]
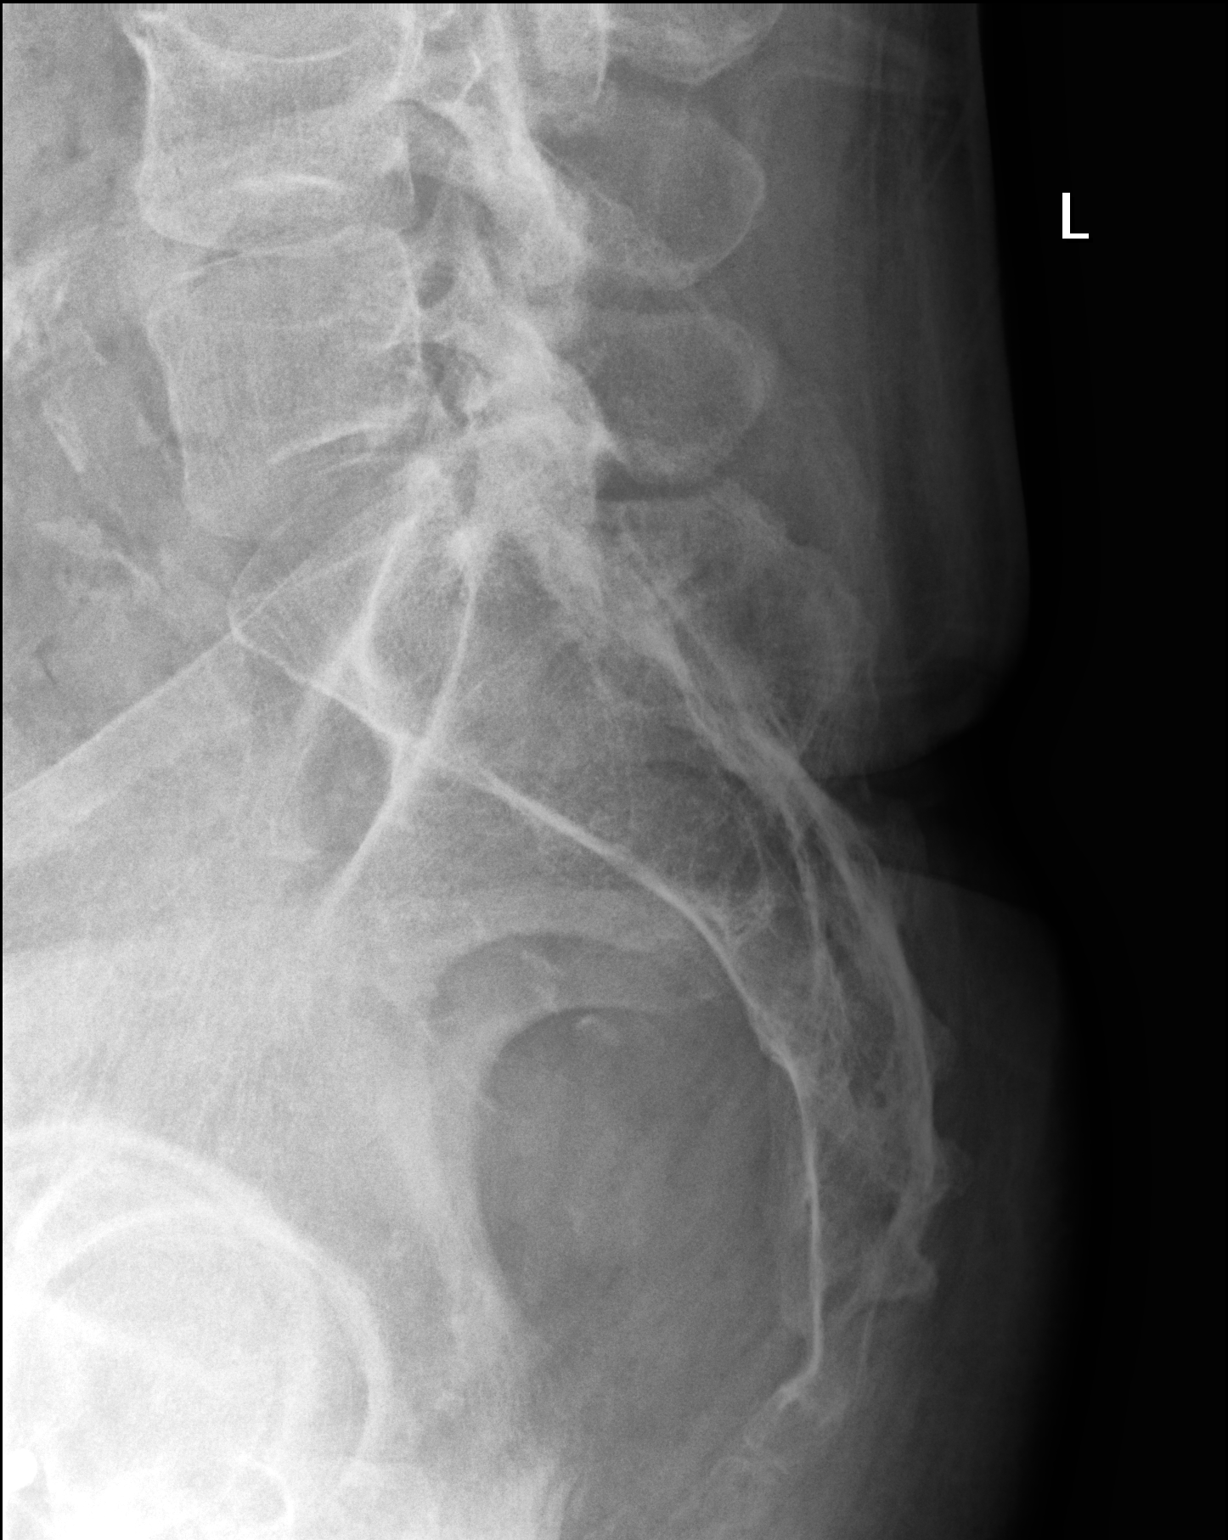

[3 of 3 positions shown; findings below may reference images not displayed]

FINDINGS: No acute fracture. Chronic superior L1 endplate deformity with
[8T]. Five non rib-bearing lumbar type vertebral bodies. Normal
alignment. Mild L5-S1 disc space loss and endplate sclerosis.
Multilevel endplate and facet degenerative spurring.
IMPRESSION: No acute osseous abnormality.  Mild multilevel spondylosis.

Chronic superior L1 endplate deformity with 20% height loss.

## 2020-08-28 MED ORDER — AMOXICILLIN-POT CLAVULANATE 500-125 MG PO TABS: 1.0000 | ORAL_TABLET | Freq: Two times a day (BID) | ORAL | 1 refills | Status: DC

## 2020-08-28 NOTE — Progress Notes (Signed)
[facility]  Reason for Consult: Chronic left heel ulcer with soft tissue infection and possible calcaneal osteomyelitis Referring Provider: Dr. Lanae Hall  Assessment: He has smoldering soft tissue infection and possible early osteomyelitis complicating Duane left heel ulcer.  Although Staph stimulans was the only organism isolated on her recent culture, she has been on broad-spectrum antibiotic therapy for 48 hours which is likely to have suppressed other bacteria.  This is almost certainly a mixed aerobic anaerobic infection.  I reviewed management options with him including converting over to oral amoxicillin clavulanate versus having a central line placed and treating with IV ertapenem.  He prefers to start with a broadened oral antibiotic regimen rather than have a central line placed.  Plan: 1. Check sed rate and C-reactive protein today 2. Change doxycycline to amoxicillin clavulanate 3. Follow-up here in 6 weeks  Patient Active Problem List   Diagnosis Date Noted  . Acute osteomyelitis of left calcaneus (HCC)     Priority: High  . Foot ulcer, left (Duane Hall) 06/18/2020    Priority: High  . Diabetic foot ulcer (Duane Hall) 07/27/2020  . End-stage renal disease on hemodialysis (Duane Hall) 07/27/2020  . Vascular insufficiency of extremity 06/18/2020  . Awaiting transplantation of kidney 06/18/2020  . Diabetic retinopathy (Duane Hall) 08/25/2015  . Claudication (Duane Hall) 04/07/2015  . Anemia of chronic disease 09/12/2014  . Hepatitis C 10/15/2013  . Pain in joint, shoulder region 02/10/2013  . Type II diabetes mellitus (Duane Hall) 02/24/2012  . Hypertension 02/24/2012  . Duane Hall 02/24/2012    Patient's Medications  New Prescriptions   AMOXICILLIN-CLAVULANATE (AUGMENTIN) 500-125 MG TABLET    Take 1 tablet (500 mg total) by mouth 2 (two) times daily.  Previous Medications   ASPIRIN EC 81 MG EC TABLET    Take 1 tablet (81 mg total) by mouth daily. Swallow whole.    CLOPIDOGREL (PLAVIX) 75 MG TABLET    Take 1 tablet (75 mg total) by mouth daily with breakfast.   COLLAGENASE (SANTYL) OINTMENT    Apply 1 application topically daily. Apply to the left heel daily with a layer the thickness of a nickel   CONTINUOUS BLOOD GLUC RECEIVER (FREESTYLE LIBRE READER) DEVI    1 each by Does not apply route See admin instructions. WILL NOT COMPLETE PA. DOES NOT MEET CRITERIA. WILL PAY OUT OF POCKET   CONTINUOUS BLOOD GLUC SENSOR (FREESTYLE LIBRE 14 DAY SENSOR) MISC    1 each by Does not apply route every 14 (fourteen) days. WILL NOT COMPLETE PA. DOES NOT MEET CRITERIA. WILL PAY OUT OF POCKET   FERRIC CITRATE (AURYXIA) 1 GM 210 MG(FE) TABLET    Take 210 mg by mouth 3 (three) times daily with meals.    GABAPENTIN (NEURONTIN) 100 MG CAPSULE    Take 300 mg by mouth daily.   GLIPIZIDE (GLUCOTROL) 10 MG TABLET    Take 1 tablet (10 mg total) by mouth daily before breakfast.   INSULIN ASPART (NOVOLOG FLEXPEN) 100 UNIT/ML FLEXPEN    Inject 10 Units into the skin 2 (two) times daily.   LIDOCAINE-PRILOCAINE (EMLA) CREAM    Apply 1 application topically See admin instructions. Apply topically to port access one hour prior to dialysis on Sunday, Monday, Wednesday, Thursday   METHOXY PEG-EPOETIN BETA (MIRCERA IJ)    Inject into the skin.   MULTIVITAMIN (RENA-VIT) TABS TABLET    Take 1 tablet by mouth daily.   MUPIROCIN OINTMENT (BACTROBAN) 2 %  Apply 1 application topically See admin instructions. Apply topically to port access after dialysis - Sunday, Monday, Wednesday, Thursday   OMEPRAZOLE (PRILOSEC) 40 MG CAPSULE    Take 40 mg by mouth daily.   PROBIOTIC PRODUCT (PROBIOTIC DAILY PO)    Take 1 each by mouth.   ROSUVASTATIN (CRESTOR) 10 MG TABLET    TAKE 1 TABLET BY MOUTH DAILY   SENNOSIDES-DOCUSATE SODIUM (SENOKOT-S) 8.6-50 MG TABLET    Take 1 tablet by mouth daily as needed for constipation.  Modified Medications   No medications on file  Discontinued Medications   DOXYCYCLINE  (VIBRA-TABS) 100 MG TABLET    TAKE 1 TABLET (100 MG TOTAL) BY MOUTH EVERY 12 (TWELVE) HOURS FOR 14 DAYS.    HPI: Duane Hall is a 64 y.o. male who noted blood on Duane left sock on Father's Day.  When Duane Hall remove the sock there was redness around Duane ankle and a dark eschar on Duane heel.  He was admitted to the hospital on 06/18/2020 and underwent left leg endarterectomy and angioplasty to improve blood flow.  He was being followed by Dr. Sherryle Hall.  He has end-stage renal disease and currently Duane home hemodialysis.  He was readmitted to the hospital on 07/27/2020 with confusion and hypotension.  Duane Hall recalls that he had malodorous drainage from Duane left heel at that time.  He was started on broad-spectrum antibiotic therapy.  MRI suggested acute osteomyelitis of Duane left calcaneus.  On 07/29/2020 she underwent a bone biopsy which revealed benign bone.  Culture grew rare Staph simulans sensitive to all antibiotics tested.  He has been on oral doxycycline ever since.  He and Duane Hall, who is with him today, feels like he has been improving slowly.  Duane eschar has been debrided in the office.  He has some slightly bloody drainage.  Duane Hall states that there is no odor.  He has not had any fever, chills or sweats.  He has not had any problems tolerating Duane doxycycline.  He is trying hard to cut down on Duane cigarettes.  He is down to about 10 daily.  He has started vaping.  Review of Systems: Review of Systems  Constitutional: Negative for chills, diaphoresis and fever.  Respiratory: Negative for cough, sputum production and shortness of breath.   Cardiovascular: Negative for chest pain.  Gastrointestinal: Negative for abdominal pain, diarrhea, nausea and vomiting.  Musculoskeletal: Positive for joint pain.      Past Medical History:  Diagnosis Date  . Duane Hall   . Cancer ([place])   . Cataract   . CKD (chronic kidney disease), stage III   . Diabetes mellitus   . Glaucoma   . Vascular  insufficiency 05/2020    Social History   Tobacco Use  . Smoking status: Current Every Day Smoker    Packs/day: 0.80    Types: Cigarettes  . Smokeless tobacco: Never Used  Vaping Use  . Vaping Use: Never used  Substance Use Topics  . Alcohol use: No    Alcohol/week: 0.0 standard drinks  . Drug use: No    Family History  Problem Relation Age of Onset  . Cancer Father   . Diabetes Mother    Allergies  Allergen Reactions  . Midodrine     Other reaction(s): see below    OBJECTIVE: Vitals:   08/28/20 1503  Pulse: 91  SpO2: 99%   There is no height or weight on file to calculate BMI.   Physical Exam  Constitutional:      Comments: He is pleasant and in no distress.  He is seated in Duane wheelchair.  Musculoskeletal:     Comments: He still has eschar on Duane left heel but it appears smaller than on pictures from 1 month ago.  He has a little bit of drainage but it has no malodor.  He has no surrounding cellulitis or fluctuance.      Photos taken 08/22/2020   Microbiology: No results found for this or any previous visit (from the past 240 hour(s)).  Michel Bickers, MD [facility] [phone] pager   [phone] cell 08/28/2020, 4:17 PM

## 2020-08-29 DIAGNOSIS — Z992 Dependence on renal dialysis: Secondary | ICD-10-CM | POA: Diagnosis not present

## 2020-08-29 DIAGNOSIS — N186 End stage renal disease: Secondary | ICD-10-CM | POA: Diagnosis not present

## 2020-08-29 DIAGNOSIS — E1129 Type 2 diabetes mellitus with other diabetic kidney complication: Secondary | ICD-10-CM | POA: Diagnosis not present

## 2020-08-29 LAB — SEDIMENTATION RATE: Sed Rate: 9 mm/h (ref 0–20)

## 2020-08-29 LAB — C-REACTIVE PROTEIN: CRP: 1.9 mg/L (ref <8.0)

## 2020-09-05 ENCOUNTER — Other Ambulatory Visit: Payer: Self-pay | Admitting: Internal Medicine

## 2020-09-05 DIAGNOSIS — M86172 Other acute osteomyelitis, left ankle and foot: Secondary | ICD-10-CM

## 2020-09-05 MED ORDER — LEVOFLOXACIN 250 MG PO TABS: 125.0000 mg | ORAL_TABLET | Freq: Every day | ORAL | 0 refills | Status: DC

## 2020-09-05 MED ORDER — METRONIDAZOLE 500 MG PO TABS: 500.0000 mg | ORAL_TABLET | Freq: Three times a day (TID) | ORAL | 0 refills | Status: DC

## 2020-09-06 ENCOUNTER — Encounter: Payer: Self-pay | Admitting: Podiatry

## 2020-09-11 DIAGNOSIS — L89620 Pressure ulcer of left heel, unstageable: Secondary | ICD-10-CM | POA: Diagnosis not present

## 2020-09-14 ENCOUNTER — Other Ambulatory Visit: Payer: Self-pay

## 2020-09-14 ENCOUNTER — Ambulatory Visit: Payer: BC Managed Care – PPO | Admitting: Podiatry

## 2020-09-14 DIAGNOSIS — I739 Peripheral vascular disease, unspecified: Secondary | ICD-10-CM

## 2020-09-14 DIAGNOSIS — L89623 Pressure ulcer of left heel, stage 3: Secondary | ICD-10-CM | POA: Diagnosis not present

## 2020-09-14 DIAGNOSIS — F17200 Nicotine dependence, unspecified, uncomplicated: Secondary | ICD-10-CM

## 2020-09-14 DIAGNOSIS — E1169 Type 2 diabetes mellitus with other specified complication: Secondary | ICD-10-CM

## 2020-09-14 DIAGNOSIS — N186 End stage renal disease: Secondary | ICD-10-CM

## 2020-09-14 DIAGNOSIS — L89613 Pressure ulcer of right heel, stage 3: Secondary | ICD-10-CM

## 2020-09-16 NOTE — Progress Notes (Signed)
  Subjective:  Patient ID: Duane Hall, male    DOB: 21-Jul-1956,  MRN: [id number]  Follow-up bilateral heel ulcers  64 y.o. male presents with the above complaint. History confirmed with patient.    Here with his wife today.  More of the wound is loosened.  They think the right side looks fine.  He saw infectious disease who recommended treating with Augmentin   Objective:  Physical Exam:   Minimal edema and erythema bilaterally.   The superior edges of the left heel eschar have begun to lift off and are loosened, debridement of this reveals healthy subcutaneous tissue with a fibrogranular base overall wound dimensions here are 7.5 cm x 6 cm x 0.2 cm.  Eschar remains centrally well adhered.  The right posterior heel ulcer measures 0.3 cm x 0.4 cm x 0.2 cm and extends to subcutaneous tissue with a fibrogranular base.  No signs of infection of either wound.  Biopsy site has healed well with a suture intact.       ESR checked by infectious disease measures 9 mm/h, was 52 2 months ago  Assessment:   No diagnosis found.   Plan:  Patient was evaluated and treated and all questions answered.  -Today debridement of the loose portions of both eschars was performed see procedure note below this was tolerated well.  Healthy fiber granular base is noted below the wound bed with no exposure of the calcaneus or fascia. -Continue local wound care at home with Santyl and wet-to-dry dressings daily or every other day.  Advised that they can do a window type tape dressing on the right side if that is more comfortable. -May bear weight on the right foot, toe-touch weightbearing on the left foot -Continue Augmentin per ID   Ulcer bilateral heel -Debridement as below. -Dressed with Santyl, DSD. -Continue off-loading with surgical shoe.  Procedure: Excisional Debridement of left heel wound Rationale: Removal of non-viable soft tissue from the wound to promote healing.  Anesthesia:  none Pre-Debridement Wound Measurements: 4.0 cm x 6.0 cm x 0.1 cm  Post-Debridement Wound Measurements: 4.0 cm x 6.0 cm x 0.1 cm (the remaining eschar was not debrided and left intact) Type of Debridement: Sharp Excisional Tissue Removed: Non-viable soft tissue Depth of Debridement: subcutaneous tissue. Technique: Sharp excisional debridement to bleeding, viable wound base.  Dressing: Dry, sterile, compression dressing. Disposition: Patient tolerated procedure well.

## 2020-09-20 ENCOUNTER — Encounter: Payer: Self-pay | Admitting: Nephrology

## 2020-09-20 DIAGNOSIS — L89623 Pressure ulcer of left heel, stage 3: Secondary | ICD-10-CM | POA: Diagnosis not present

## 2020-09-20 DIAGNOSIS — I7300 Raynaud's syndrome without gangrene: Secondary | ICD-10-CM | POA: Diagnosis not present

## 2020-09-20 DIAGNOSIS — L8996 Pressure-induced deep tissue damage of unspecified site: Secondary | ICD-10-CM | POA: Diagnosis not present

## 2020-09-28 DIAGNOSIS — N186 End stage renal disease: Secondary | ICD-10-CM | POA: Diagnosis not present

## 2020-09-28 DIAGNOSIS — Z992 Dependence on renal dialysis: Secondary | ICD-10-CM | POA: Diagnosis not present

## 2020-09-28 DIAGNOSIS — E1129 Type 2 diabetes mellitus with other diabetic kidney complication: Secondary | ICD-10-CM | POA: Diagnosis not present

## 2020-10-05 ENCOUNTER — Ambulatory Visit (INDEPENDENT_AMBULATORY_CARE_PROVIDER_SITE_OTHER): Payer: BC Managed Care – PPO | Admitting: Podiatry

## 2020-10-05 ENCOUNTER — Other Ambulatory Visit: Payer: Self-pay

## 2020-10-05 DIAGNOSIS — M5416 Radiculopathy, lumbar region: Secondary | ICD-10-CM | POA: Diagnosis not present

## 2020-10-05 DIAGNOSIS — F17200 Nicotine dependence, unspecified, uncomplicated: Secondary | ICD-10-CM

## 2020-10-05 DIAGNOSIS — L89623 Pressure ulcer of left heel, stage 3: Secondary | ICD-10-CM | POA: Diagnosis not present

## 2020-10-05 DIAGNOSIS — I739 Peripheral vascular disease, unspecified: Secondary | ICD-10-CM | POA: Diagnosis not present

## 2020-10-05 DIAGNOSIS — N186 End stage renal disease: Secondary | ICD-10-CM | POA: Diagnosis not present

## 2020-10-05 DIAGNOSIS — E1169 Type 2 diabetes mellitus with other specified complication: Secondary | ICD-10-CM | POA: Diagnosis not present

## 2020-10-05 DIAGNOSIS — L89613 Pressure ulcer of right heel, stage 3: Secondary | ICD-10-CM

## 2020-10-06 ENCOUNTER — Encounter: Payer: Self-pay | Admitting: Podiatry

## 2020-10-06 NOTE — Progress Notes (Signed)
  Subjective:  Patient ID: Duane Hall, male    DOB: 18-Dec-1956,  MRN: [id number]  Follow-up bilateral heel ulcers  64 y.o. male presents with the above complaint. History confirmed with patient.   Here with his wife today.  Wounds have been stable at home.   Objective:  Physical Exam:   Minimal edema and erythema bilaterally.  Remaining eschar is loosened, post debridement dimensions here are 7.0 cm x 5.5 cm x 0.2 cm.  Right posterior heel ulcer nearly fully healed.  No signs of infection of either wound.            Assessment:   1. Pressure injury of left heel, stage 3 ([place])   2. PAD (peripheral artery disease) ([place])   3. Pressure injury of right heel, stage 3 ([place])   4. Current smoker   5. ESRD (end stage renal disease) ([place])   6. Type 2 diabetes mellitus with other specified complication, without long-term current use of insulin ([place])      Plan:  Patient was evaluated and treated and all questions answered.  -Today debridement of the remaining eschar was removed from left heel see procedure note below this was tolerated well.  Healthy fibrogranular wound bed with good perfusion remains. -Right side nearly healed - Continue offloading -Continue local wound care at home with Santyl and wet-to-dry dressings daily or every other day.  Advised that they can do a window type tape dressing on the right side if that is more comfortable. -May bear weight on the right foot, toe-touch weightbearing on the left foot - They will contact [employer] for more supplies, will reorder PRN  Ulcer bilateral heel -Debridement as below. -Dressed with Santyl, DSD. -Continue off-loading with surgical shoe.  Procedure: Excisional Debridement of left heel wound Rationale: Removal of non-viable soft tissue from the wound to promote healing.  Anesthesia: none Pre-Debridement Wound Measurements: 7.0 x 5.5 x 0.3 Post-Debridement Wound Measurements: 6.0 x 5.5 x 0.3 cm (all eschar now removed,  wound margins not debrided) Type of Debridement: Sharp Excisional Tissue Removed: Non-viable soft tissue Depth of Debridement: subcutaneous tissue. Technique: Sharp excisional debridement to bleeding, viable wound base.  Dressing: Dry, sterile, compression dressing. Disposition: Patient tolerated procedure well.

## 2020-10-10 ENCOUNTER — Other Ambulatory Visit: Payer: Self-pay

## 2020-10-10 ENCOUNTER — Ambulatory Visit (INDEPENDENT_AMBULATORY_CARE_PROVIDER_SITE_OTHER): Payer: BC Managed Care – PPO | Admitting: Internal Medicine

## 2020-10-10 ENCOUNTER — Encounter: Payer: Self-pay | Admitting: Internal Medicine

## 2020-10-10 DIAGNOSIS — M86172 Other acute osteomyelitis, left ankle and foot: Secondary | ICD-10-CM

## 2020-10-10 NOTE — Assessment & Plan Note (Signed)
He has now completed 6 weeks of broaden antibiotic therapy for left heel soft tissue infection and possible calcaneal osteomyelitis.  His inflammatory markers have normalized and his wound is looking much better.  He may be having side effects related to his current antibiotic therapy.  I will have him stop levofloxacin and metronidazole now and follow-up here in 6 weeks.

## 2020-10-10 NOTE — Progress Notes (Signed)
[facility] for Infectious Disease  Patient Active Problem List   Diagnosis Date Noted  . Acute osteomyelitis of left calcaneus (HCC)     Priority: High  . Foot ulcer, left ([place]) 06/18/2020    Priority: High  . Diabetic foot ulcer ([place]) 07/27/2020  . End-stage renal disease on hemodialysis ([place]) 07/27/2020  . Vascular insufficiency of extremity 06/18/2020  . Awaiting transplantation of kidney 06/18/2020  . Diabetic retinopathy ([place]) 08/25/2015  . Claudication ([place]) 04/07/2015  . Anemia of chronic disease 09/12/2014  . Hepatitis C 10/15/2013  . Pain in joint, shoulder region 02/10/2013  . Type II diabetes mellitus ([place]) 02/24/2012  . Hypertension 02/24/2012  . Asthma 02/24/2012    Patient's Medications  New Prescriptions   No medications on file  Previous Medications   ASPIRIN EC 81 MG EC TABLET    Take 1 tablet (81 mg total) by mouth daily. Swallow whole.   CLOPIDOGREL (PLAVIX) 75 MG TABLET    Take 1 tablet (75 mg total) by mouth daily with breakfast.   COLLAGENASE (SANTYL) OINTMENT    Apply 1 application topically daily. Apply to the left heel daily with a layer the thickness of a nickel   CONTINUOUS BLOOD GLUC RECEIVER (FREESTYLE LIBRE READER) DEVI    1 each by Does not apply route See admin instructions. WILL NOT COMPLETE PA. DOES NOT MEET CRITERIA. WILL PAY OUT OF POCKET   CONTINUOUS BLOOD GLUC SENSOR (FREESTYLE LIBRE 14 DAY SENSOR) MISC    1 each by Does not apply route every 14 (fourteen) days. WILL NOT COMPLETE PA. DOES NOT MEET CRITERIA. WILL PAY OUT OF POCKET   FERRIC CITRATE (AURYXIA) 1 GM 210 MG(FE) TABLET    Take 210 mg by mouth 3 (three) times daily with meals.    GABAPENTIN (NEURONTIN) 100 MG CAPSULE    Take 300 mg by mouth daily.   GLIPIZIDE (GLUCOTROL) 10 MG TABLET    Take 1 tablet (10 mg total) by mouth daily before breakfast.   GVOKE HYPOPEN 1-PACK 1 MG/0.2ML SOAJ    Inject into the skin.   INSULIN ASPART (NOVOLOG FLEXPEN) 100 UNIT/ML FLEXPEN     Inject 10 Units into the skin 2 (two) times daily.   LIDOCAINE-PRILOCAINE (EMLA) CREAM    Apply 1 application topically See admin instructions. Apply topically to port access one hour prior to dialysis on Sunday, Monday, Wednesday, Thursday   METHOXY PEG-EPOETIN BETA (MIRCERA IJ)    Inject into the skin.   MULTIVITAMIN (RENA-VIT) TABS TABLET    Take 1 tablet by mouth daily.   MUPIROCIN OINTMENT (BACTROBAN) 2 %    Apply 1 application topically See admin instructions. Apply topically to port access after dialysis - Sunday, Monday, Wednesday, Thursday   OMEPRAZOLE (PRILOSEC) 40 MG CAPSULE    Take 40 mg by mouth daily.   PROBIOTIC PRODUCT (PROBIOTIC DAILY PO)    Take 1 each by mouth.   ROSUVASTATIN (CRESTOR) 10 MG TABLET    TAKE 1 TABLET BY MOUTH DAILY   SENNOSIDES-DOCUSATE SODIUM (SENOKOT-S) 8.6-50 MG TABLET    Take 1 tablet by mouth daily as needed for constipation.  Modified Medications   No medications on file  Discontinued Medications   DOXYCYCLINE (DORYX) 100 MG EC TABLET    SMARTSIG:1 Tablet(s) By Mouth Every 12 Hours   LEVOFLOXACIN (LEVAQUIN) 250 MG TABLET    Take 0.5 tablets (125 mg total) by mouth daily.   METRONIDAZOLE (FLAGYL) 500 MG TABLET  Take 1 tablet (500 mg total) by mouth 3 (three) times daily.    Subjective: HPI:  Duane Hall is in with his wife for his routine follow-up visit.  He is a 64 y.o. male who noted blood on his left sock on Father's Day.  When his wife remove the sock there was redness around his ankle and a dark eschar on his heel.  He was admitted to the hospital on 06/18/2020 and underwent left leg endarterectomy and angioplasty to improve blood flow.  He was being followed by Dr. Lanae Crumbly, his podiatrist.  He has end-stage renal disease and currently his home hemodialysis. He was readmitted to the hospital on 07/27/2020 with confusion and hypotension.  His wife recalls that he had malodorous drainage from his left heel at that time.  He was started on broad-spectrum  antibiotic therapy.  MRI suggested acute osteomyelitis of his left calcaneus.  On 07/29/2020 she underwent a bone biopsy which revealed benign bone.  Culture grew rare Staph simulans sensitive to all antibiotics tested.  He was discharged on oral doxycycline.  When I first saw him on 08/28/2020 I change doxycycline to amoxicillin clavulanate.   He developed some diarrhea so I have changed that to levofloxacin and metronidazole on 09/05/2020.  His diarrhea resolved promptly.  He and his wife, who is with him today, feels like he has been improving slowly.  His eschar has been debrided in the office.  He has some slightly bloody drainage.  His wife states that there is no odor.  He has not had any fever, chills or sweats.  He is trying hard to cut down on his cigarettes.  He is down to about 7 daily.  He has started vaping.  He says that he has started having very vivid dreams ever since he was placed on levofloxacin and metronidazole.  They usually occur while he is sleeping on hemodialysis.  They rarely occur on nondialysis days.  Review of Systems: Review of Systems  Constitutional: Negative for chills, diaphoresis, fever and weight loss.    Past Medical History:  Diagnosis Date  . Asthma   . Cancer ([place])   . Cataract   . CKD (chronic kidney disease), stage III ([place])   . Diabetes mellitus   . Glaucoma   . Vascular insufficiency 05/2020    Social History   Tobacco Use  . Smoking status: Current Every Day Smoker    Packs/day: 0.30    Types: Cigarettes  . Smokeless tobacco: Never Used  Vaping Use  . Vaping Use: Never used  Substance Use Topics  . Alcohol use: No    Alcohol/week: 0.0 standard drinks  . Drug use: No    Family History  Problem Relation Age of Onset  . Cancer Father   . Diabetes Mother     Allergies  Allergen Reactions  . Midodrine     Other reaction(s): see below    Objective: Vitals:   10/10/20 1536  BP: 96/63  Pulse: 97  Temp: 98.3 F (36.8 C)  TempSrc:  Oral  SpO2: 98%   There is no height or weight on file to calculate BMI.  Physical Exam Constitutional:      Comments: He is pleasant and in good spirits.  He is seated in his wheelchair.  Musculoskeletal:     Comments: He still has a large wound on his left heel but the dark eschar has now been debrided.  There is no exposed bone.  He has some thin bloody  drainage.  There is no malodor, surrounding cellulitis or fluctuance.       Lab Results Sed Rate  Date Value  08/28/2020 9 mm/h  06/26/2020 52 mm/hr (H)  11/17/2012 23 mm/hr (H)   CRP (mg/L)  Date Value  08/28/2020 1.9  06/26/2020 25 (H)     Problem List Items Addressed This Visit      High   Acute osteomyelitis of left calcaneus ([place])    He has now completed 6 weeks of broaden antibiotic therapy for left heel soft tissue infection and possible calcaneal osteomyelitis.  His inflammatory markers have normalized and his wound is looking much better.  He may be having side effects related to his current antibiotic therapy.  I will have him stop levofloxacin and metronidazole now and follow-up here in 6 weeks.          Michel Bickers, MD [facility] for [place] [facility] [phone] pager   [phone] cell 10/10/2020, 4:08 PM

## 2020-10-13 ENCOUNTER — Other Ambulatory Visit (HOSPITAL_COMMUNITY)
Admission: RE | Admit: 2020-10-13 | Discharge: 2020-10-13 | Disposition: A | Discharge status: Home / Self Care | Payer: BC Managed Care – PPO | Source: Ambulatory Visit | Attending: Vascular Surgery | Admitting: Vascular Surgery

## 2020-10-13 ENCOUNTER — Ambulatory Visit (HOSPITAL_COMMUNITY)
Admission: RE | Admit: 2020-10-13 | Discharge: 2020-10-13 | Disposition: A | Discharge status: Home / Self Care | Payer: BC Managed Care – PPO | Source: Ambulatory Visit | Attending: Vascular Surgery | Admitting: Vascular Surgery

## 2020-10-13 ENCOUNTER — Ambulatory Visit (INDEPENDENT_AMBULATORY_CARE_PROVIDER_SITE_OTHER)
Admission: RE | Admit: 2020-10-13 | Discharge: 2020-10-13 | Disposition: A | Discharge status: Home / Self Care | Payer: BC Managed Care – PPO | Source: Ambulatory Visit | Attending: Vascular Surgery | Admitting: Vascular Surgery

## 2020-10-13 ENCOUNTER — Other Ambulatory Visit: Payer: Self-pay

## 2020-10-13 ENCOUNTER — Ambulatory Visit: Payer: BC Managed Care – PPO | Admitting: Endocrinology

## 2020-10-13 ENCOUNTER — Ambulatory Visit: Payer: BC Managed Care – PPO | Admitting: Physician Assistant

## 2020-10-13 ENCOUNTER — Encounter: Payer: Self-pay | Admitting: Endocrinology

## 2020-10-13 VITALS — BP 94/58 | HR 80 | Temp 98.6°F | Resp 20 | Ht 69.0 in | Wt 165.3 lb

## 2020-10-13 VITALS — BP 102/60 | HR 94 | Ht 69.0 in | Wt 170.6 lb

## 2020-10-13 DIAGNOSIS — Z992 Dependence on renal dialysis: Secondary | ICD-10-CM

## 2020-10-13 DIAGNOSIS — Z20822 Contact with and (suspected) exposure to covid-19: Secondary | ICD-10-CM | POA: Diagnosis not present

## 2020-10-13 DIAGNOSIS — E1122 Type 2 diabetes mellitus with diabetic chronic kidney disease: Secondary | ICD-10-CM | POA: Diagnosis not present

## 2020-10-13 DIAGNOSIS — S91302D Unspecified open wound, left foot, subsequent encounter: Secondary | ICD-10-CM

## 2020-10-13 DIAGNOSIS — N186 End stage renal disease: Secondary | ICD-10-CM

## 2020-10-13 DIAGNOSIS — I739 Peripheral vascular disease, unspecified: Secondary | ICD-10-CM

## 2020-10-13 LAB — SARS CORONAVIRUS 2 (TAT 6-24 HRS): SARS Coronavirus 2: NEGATIVE

## 2020-10-13 LAB — POCT GLYCOSYLATED HEMOGLOBIN (HGB A1C): Hemoglobin A1C: 8.2 % — AB (ref 4.0–5.6)

## 2020-10-13 MED ORDER — SODIUM CHLORIDE 0.9 % IV SOLN: 250.0000 mL | INTRAVENOUS | Status: DC | PRN

## 2020-10-13 MED ORDER — SODIUM CHLORIDE 0.9% FLUSH: 3.0000 mL | INTRAVENOUS | Status: DC | PRN

## 2020-10-13 MED ORDER — NOVOLOG FLEXPEN 100 UNIT/ML ~~LOC~~ SOPN: 12.0000 [IU] | PEN_INJECTOR | Freq: Two times a day (BID) | SUBCUTANEOUS | 2 refills | Status: DC

## 2020-10-13 MED ORDER — SODIUM CHLORIDE 0.9% FLUSH: 3.0000 mL | Freq: Two times a day (BID) | INTRAVENOUS | Status: DC

## 2020-10-13 MED ORDER — GLIPIZIDE 10 MG PO TABS
10.0000 mg | ORAL_TABLET | Freq: Every day | ORAL | 0 refills | Status: DC
Start: 2020-10-13 — End: 2020-11-10

## 2020-10-13 NOTE — Patient Instructions (Addendum)
check your blood sugar once a day.  vary the time of day when you check, between before the 3 meals, and at bedtime.  also check if you have symptoms of your blood sugar being too high or too low.  please keep a record of the readings and bring it to your next appointment here (or you can bring the meter itself).  You can write it on any piece of paper.  please call us sooner if your blood sugar goes below 70, or if you have a lot of readings over 200.  Please reduce the glipizide to breakfast only, and:   increase the Novolog to 12 units twice a day (before meals).   We will need to take this complex situation in stages.   On Monday, for your surgery, skip the glipizide altogether.  Resume it Tuesday morning.  Resume novolog when eating again. Please come back for a follow-up appointment in 1 month.

## 2020-10-13 NOTE — Progress Notes (Signed)
Office Note     CC:  follow up Requesting Provider:  Heywood Bene, *  HPI: Duane Hall is a 64 y.o. (1956-03-22) male who presents for 3 month follow up s/p LLE SFA intervention by Dr. Donzetta Matters. He underwent laser atherectomy and DCB angioplasty of left SFA on 6/218/2021. He has history of bilateral heel wounds followed by Dr. Sherryle Lis. He had palpable bilateral PT pulses at last office visit and returns today for assessment and follow-up non-invasive studies.  His wife accompanies him today.  He denies rest pain, but she states he has been complaining of increased lower extremity pain.  Denies fever or chills.  He continues heel wound care by Dr. Sherryle Lis.  States blood sugars have been fluctuating.  Compliant with asa, statin and Plavix History of ESRD Past Medical History:  Diagnosis Date  . Asthma   . Cancer ([place])   . Cataract   . CKD (chronic kidney disease), stage III ([place])   . Diabetes mellitus   . Glaucoma   . Vascular insufficiency 05/2020    Past Surgical History:  Procedure Laterality Date  . ABDOMINAL AORTOGRAM W/LOWER EXTREMITY N/A 06/19/2020   Procedure: ABDOMINAL AORTOGRAM W/LOWER EXTREMITY;  Surgeon: Waynetta Sandy, MD;  Location: [facility];  Service: Cardiovascular;  Laterality: N/A;  . AV FISTULA PLACEMENT Right 09/14/2019   Procedure: Right Arm Basilic Vein transposition;  Surgeon: Angelia Mould, MD;  Location: [facility];  Service: Vascular;  Laterality: Right;  . BONE BIOPSY Left 07/29/2020   Procedure: BONE BIOPSY;  Surgeon: [name], DPM;  Location: [facility];  Service: Podiatry;  Laterality: Left;  Need bone trephines and/or large bore Giamshidi  . COLONOSCOPY    . PERIPHERAL VASCULAR ATHERECTOMY Left 06/19/2020   Procedure: PERIPHERAL VASCULAR ATHERECTOMY;  Surgeon: Waynetta Sandy, MD;  Location: [facility];  Service: Cardiovascular;  Laterality: Left;  SFA  . UPPER GASTROINTESTINAL ENDOSCOPY  02/2019    Dr Benson [place]      Social History   Socioeconomic History  . Marital status: Married    Spouse name: Not on file  . Number of children: Not on file  . Years of education: Not on file  . Highest education level: Not on file  Occupational History  . Occupation: Chief Financial Officer  Tobacco Use  . Smoking status: Current Every Day Smoker    Packs/day: 0.30    Types: Cigarettes  . Smokeless tobacco: Never Used  Vaping Use  . Vaping Use: Never used  Substance and Sexual Activity  . Alcohol use: No    Alcohol/week: 0.0 standard drinks  . Drug use: No  . Sexual activity: Not on file  Other Topics Concern  . Not on file  Social History Narrative   Married.    Social Determinants of Health   Financial Resource Strain:   . Difficulty of Paying Living Expenses: Not on file  Food Insecurity:   . Worried About Charity fundraiser in the Last Year: Not on file  . Ran Out of Food in the Last Year: Not on file  Transportation Needs:   . Lack of Transportation (Medical): Not on file  . Lack of Transportation (Non-Medical): Not on file  Physical Activity:   . Days of Exercise per Week: Not on file  . Minutes of Exercise per Session: Not on file  Stress:   . Feeling of Stress : Not on file  Social Connections:   . Frequency of Communication with Friends  and Family: Not on file  . Frequency of Social Gatherings with Friends and Family: Not on file  . Attends Religious Services: Not on file  . Active Member of Clubs or Organizations: Not on file  . Attends Archivist Meetings: Not on file  . Marital Status: Not on file  Intimate Partner Violence:   . Fear of Current or Ex-Partner: Not on file  . Emotionally Abused: Not on file  . Physically Abused: Not on file  . Sexually Abused: Not on file   Family History  Problem Relation Age of Onset  . Cancer Father   . Diabetes Mother     Current Outpatient Medications  Medication Sig Dispense Refill  . aspirin EC 81 MG EC tablet Take 1  tablet (81 mg total) by mouth daily. Swallow whole. 30 tablet 11  . clopidogrel (PLAVIX) 75 MG tablet Take 1 tablet (75 mg total) by mouth daily with breakfast. 30 tablet 3  . collagenase (SANTYL) ointment Apply 1 application topically daily. Apply to the left heel daily with a layer the thickness of a nickel 30 g 2  . Continuous Blood Gluc Receiver (FREESTYLE LIBRE READER) DEVI 1 each by Does not apply route See admin instructions. WILL NOT COMPLETE PA. DOES NOT MEET CRITERIA. WILL PAY OUT OF POCKET 1 each 0  . Continuous Blood Gluc Sensor (FREESTYLE LIBRE 14 DAY SENSOR) MISC 1 each by Does not apply route every 14 (fourteen) days. WILL NOT COMPLETE PA. DOES NOT MEET CRITERIA. WILL PAY OUT OF POCKET 2 each 2  . ferric citrate (AURYXIA) 1 GM 210 MG(Fe) tablet Take 210 mg by mouth 3 (three) times daily with meals.     . gabapentin (NEURONTIN) 100 MG capsule Take 300 mg by mouth daily.    Marland Kitchen glipiZIDE (GLUCOTROL) 10 MG tablet Take 1 tablet (10 mg total) by mouth daily before breakfast. (Patient taking differently: Take 10 mg by mouth 2 (two) times daily. ) 14 tablet 0  . GVOKE HYPOPEN 1-PACK 1 MG/0.2ML SOAJ Inject into the skin.    Marland Kitchen insulin aspart (NOVOLOG FLEXPEN) 100 UNIT/ML FlexPen Inject 10 Units into the skin 2 (two) times daily. 6 mL 2  . lidocaine-prilocaine (EMLA) cream Apply 1 application topically See admin instructions. Apply topically to port access one hour prior to dialysis on Sunday, Monday, Wednesday, Thursday    . Methoxy PEG-Epoetin Beta (MIRCERA IJ) Inject into the skin.    . multivitamin (RENA-VIT) TABS tablet Take 1 tablet by mouth daily.    . mupirocin ointment (BACTROBAN) 2 % Apply 1 application topically See admin instructions. Apply topically to port access after dialysis - Sunday, Monday, Wednesday, Thursday (Patient not taking: Reported on 08/28/2020)    . omeprazole (PRILOSEC) 40 MG capsule Take 40 mg by mouth daily.    . Probiotic Product (PROBIOTIC DAILY PO) Take 1 each by  mouth.    . rosuvastatin (CRESTOR) 10 MG tablet TAKE 1 TABLET BY MOUTH DAILY 30 tablet 11  . sennosides-docusate sodium (SENOKOT-S) 8.6-50 MG tablet Take 1 tablet by mouth daily as needed for constipation.     No current facility-administered medications for this visit.    Allergies  Allergen Reactions  . Midodrine     Other reaction(s): see below     REVIEW OF SYSTEMS:   [X]  denotes positive finding, [ ]  denotes negative finding Cardiac  Comments:  Chest pain or chest pressure:    Shortness of breath upon exertion:    Short of breath  when lying flat:    Irregular heart rhythm:        Vascular    Pain in calf, thigh, or hip brought on by ambulation:    Pain in feet at night that wakes you up from your sleep:     Blood clot in your veins:    Leg swelling:         Pulmonary    Oxygen at home:    Productive cough:     Wheezing:         Neurologic    Sudden weakness in arms or legs:     Sudden numbness in arms or legs:     Sudden onset of difficulty speaking or slurred speech:    Temporary loss of vision in one eye:     Problems with dizziness:         Gastrointestinal    Blood in stool:     Vomited blood:         Genitourinary    Burning when urinating:     Blood in urine:        Psychiatric    Major depression:         Hematologic    Bleeding problems:    Problems with blood clotting too easily:        Skin    Rashes or ulcers:        Constitutional    Fever or chills:      PHYSICAL EXAMINATION:  Vitals:   10/13/20 0855  BP: (!) 94/58  Pulse: 80  Resp: 20  Temp: 98.6 F (37 C)  SpO2: 100%   General:  WDWN in NAD; vital signs documented above Gait: Not observed/does not ambulate due to heel wounds. HENT: WNL, normocephalic Pulmonary: normal non-labored breathing , without Rales, rhonchi,  wheezing Cardiac: regular HR,  Skin: without rashes Vascular Exam/Pulses: I am unable to palpate pedal pulses.  He has dampened monophasic anterior tibial  and peroneal signals bilaterally.  Dominant brisk monophasic posterior tibial pulses bilaterally. Extremities: with chronic ischemic changes, without Gangrene , without cellulitis; with open wounds; right heel wound appears to have epithelialized.  Left heel wound with healthy underlying tissue and granulation buds apparent Musculoskeletal: no muscle wasting or atrophy  Neurologic: A&O X 3;  No focal weakness or paresthesias are detected Psychiatric:  The pt has Normal affect.       Non-Invasive Vascular Imaging:   10/13/2020 ABI/TBIToday's ABIToday's TBIPrevious ABIPrevious TBI  +-------+-----------+-----------+------------+------------+  Right 0.7    0.81    0.9     1.23      +-------+-----------+-----------+------------+------------+  Left  0.76    0.26    0.97    0.49      +-------+-----------+-----------+------------+------------+  Right: 50-74% stenosis noted in the deep femoral artery. 50-74% stenosis  noted in the superficial femoral artery.  Right great toe pressure 93  Left: 50-74% stenosis noted in the superficial femoral artery. 30-49%  stenosis noted in the popliteal artery.  Left great toe pressure 30  Postprocedure studies on 07/14/2020: ABIs to be right 0.9 left 0.97 with toe pressure on the right 171 and left 66.  Left SFA proximally has 75 to 99% stenosis with monophasic waveforms distally.   ASSESSMENT/PLAN:: 64 y.o. male here for follow up for peripheral arterial disease status post angioplasty and laser atherectomy of left SFA.  His ABIs are decreased today and his toe pressures have dropped significantly.  I reviewed the data and physical exam findings with  Dr. Donzetta Matters this morning.  He recommends arteriogram with likely intervention and the patient and his wife agree with this plan.  Continue aspirin, Plavix and statin; tight glucose control; local wound care.  Barbie Banner, PA-C [facility] [phone]  Clinic MD:   Donzetta Matters

## 2020-10-13 NOTE — Progress Notes (Signed)
Subjective:    Patient ID: Duane Hall, male    DOB: May 09, 1956, 64 y.o.   MRN: [id number]  HPI Pt returns for f/u of diabetes mellitus: DM type: Insulin-requiring type 2, but he is at risk for evolving type 1 Dx'ed: 5284 Complications: ESRD, foot ulcers, GP, PAD, and PDR Therapy: insulin since 2021 DKA: never Severe hypoglycemia: never.  Pancreatitis: never Pancreatic imaging:  SDOH: Wife provides most hx, due to pt's poor overall health. Other: fructosamine converts to higher avg glucose than A1c, prob due to renal failure; he has [place] continuous glucose monitor; He stopped Trulicity, due to GP; He eats 2 meals per day Interval history: I reviewed continuous glucose monitor data.  Glucose varies from 80-370.  There is no trend throughout the day, except it is slightly lower fasting.  He takes glipizide 10 mg BID.  He has hypoglycemia approx once per week.  He has vascular procedure scheduled for next week Past Medical History:  Diagnosis Date  . Asthma   . Cancer ([place])   . Cataract   . CKD (chronic kidney disease), stage III ([place])   . Diabetes mellitus   . Glaucoma   . Vascular insufficiency 05/2020    Past Surgical History:  Procedure Laterality Date  . ABDOMINAL AORTOGRAM W/LOWER EXTREMITY N/A 06/19/2020   Procedure: ABDOMINAL AORTOGRAM W/LOWER EXTREMITY;  Surgeon: Waynetta Sandy, MD;  Location: [facility];  Service: Cardiovascular;  Laterality: N/A;  . AV FISTULA PLACEMENT Right 09/14/2019   Procedure: Right Arm Basilic Vein transposition;  Surgeon: Angelia Mould, MD;  Location: [facility];  Service: Vascular;  Laterality: Right;  . BONE BIOPSY Left 07/29/2020   Procedure: BONE BIOPSY;  Surgeon: [name], DPM;  Location: [facility];  Service: Podiatry;  Laterality: Left;  Need bone trephines and/or large bore Giamshidi  . COLONOSCOPY    . PERIPHERAL VASCULAR ATHERECTOMY Left 06/19/2020   Procedure: PERIPHERAL VASCULAR ATHERECTOMY;  Surgeon: Waynetta Sandy, MD;  Location: [facility];  Service: Cardiovascular;  Laterality: Left;  SFA  . UPPER GASTROINTESTINAL ENDOSCOPY  02/2019   Dr Benson Norway      Social History   Socioeconomic History  . Marital status: Married    Spouse name: Not on file  . Number of children: Not on file  . Years of education: Not on file  . Highest education level: Not on file  Occupational History  . Occupation: Chief Financial Officer  Tobacco Use  . Smoking status: Current Every Day Smoker    Packs/day: 0.30    Types: Cigarettes  . Smokeless tobacco: Never Used  Vaping Use  . Vaping Use: Never used  Substance and Sexual Activity  . Alcohol use: No    Alcohol/week: 0.0 standard drinks  . Drug use: No  . Sexual activity: Not on file  Other Topics Concern  . Not on file  Social History Narrative   Married.    Social Determinants of Health   Financial Resource Strain:   . Difficulty of Paying Living Expenses: Not on file  Food Insecurity:   . Worried About Charity fundraiser in the Last Year: Not on file  . Ran Out of Food in the Last Year: Not on file  Transportation Needs:   . Lack of Transportation (Medical): Not on file  . Lack of Transportation (Non-Medical): Not on file  Physical Activity:   . Days of Exercise per Week: Not on file  . Minutes of Exercise per Session:  Not on file  Stress:   . Feeling of Stress : Not on file  Social Connections:   . Frequency of Communication with Friends and Family: Not on file  . Frequency of Social Gatherings with Friends and Family: Not on file  . Attends Religious Services: Not on file  . Active Member of Clubs or Organizations: Not on file  . Attends Archivist Meetings: Not on file  . Marital Status: Not on file  Intimate Partner Violence:   . Fear of Current or Ex-Partner: Not on file  . Emotionally Abused: Not on file  . Physically Abused: Not on file  . Sexually Abused: Not on file    Current Outpatient Medications on File  Prior to Visit  Medication Sig Dispense Refill  . aspirin EC 81 MG EC tablet Take 1 tablet (81 mg total) by mouth daily. Swallow whole. 30 tablet 11  . clopidogrel (PLAVIX) 75 MG tablet Take 1 tablet (75 mg total) by mouth daily with breakfast. 30 tablet 3  . collagenase (SANTYL) ointment Apply 1 application topically daily. Apply to the left heel daily with a layer the thickness of a nickel 30 g 2  . Continuous Blood Gluc Receiver (FREESTYLE LIBRE READER) DEVI 1 each by Does not apply route See admin instructions. WILL NOT COMPLETE PA. DOES NOT MEET CRITERIA. WILL PAY OUT OF POCKET 1 each 0  . Continuous Blood Gluc Sensor (FREESTYLE LIBRE 14 DAY SENSOR) MISC 1 each by Does not apply route every 14 (fourteen) days. WILL NOT COMPLETE PA. DOES NOT MEET CRITERIA. WILL PAY OUT OF POCKET 2 each 2  . ferric citrate (AURYXIA) 1 GM 210 MG(Fe) tablet Take 210 mg by mouth in the morning and at bedtime.     . gabapentin (NEURONTIN) 300 MG capsule Take 300 mg by mouth daily.     Marland Kitchen GVOKE HYPOPEN 1-PACK 1 MG/0.2ML SOAJ Inject 1 mg into the skin as needed (low blood sugar).     Marland Kitchen lidocaine-prilocaine (EMLA) cream Apply 1 application topically See admin instructions. Apply topically to port access one hour prior to dialysis on Sunday, Monday, Wednesday, Thursday    . Methoxy PEG-Epoetin Beta (MIRCERA IJ) Inject into the skin every 30 (thirty) days.     . multivitamin (RENA-VIT) TABS tablet Take 1 tablet by mouth daily.    Marland Kitchen omeprazole (PRILOSEC) 40 MG capsule Take 40 mg by mouth daily.    . Probiotic Product (PROBIOTIC DAILY PO) Take 1 each by mouth at bedtime.     . rosuvastatin (CRESTOR) 10 MG tablet TAKE 1 TABLET BY MOUTH DAILY (Patient taking differently: Take 10 mg by mouth daily. ) 30 tablet 11  . sennosides-docusate sodium (SENOKOT-S) 8.6-50 MG tablet Take 1 tablet by mouth at bedtime.      No current facility-administered medications on file prior to visit.    Allergies  Allergen Reactions  .  Augmentin [Amoxicillin-Pot Clavulanate] Diarrhea  . Midodrine Other (See Comments)    Other reaction(s): Urinary Sensation    Family History  Problem Relation Age of Onset  . Cancer Father   . Diabetes Mother     BP 102/60   Pulse 94   Ht 5\' 9"  (1.753 m)   Wt 170 lb 9.6 oz (77.4 kg)   SpO2 97%   BMI 25.19 kg/m   Review of Systems Denies LOC.     Objective:   Physical Exam VITAL SIGNS:  See vs page.   GENERAL: no distress.  In wheelchair  EXT: both feet are bandaged (sees wound care).     Lab Results  Component Value Date   CREATININE 6.96 (H) 07/31/2020   BUN 44 (H) 07/31/2020   NA 136 07/31/2020   K 4.2 07/31/2020   CL 100 07/31/2020   CO2 27 07/31/2020   Lab Results  Component Value Date   HGBA1C 8.2 (A) 10/13/2020      Assessment & Plan:  Insulin-requiring type 2 DM, with PAD.  Glucose is extremely variable.  It is unlikely to be stabilized in time for vascular procedure next week, but he should still undergo the procedure if he needs it. ESRD: in this setting, insulin is favored over glipizide.  Hypoglycemia, due to insulin: this limits aggressiveness of glycemic control   Patient Instructions  check your blood sugar once a day.  vary the time of day when you check, between before the 3 meals, and at bedtime.  also check if you have symptoms of your blood sugar being too high or too low.  please keep a record of the readings and bring it to your next appointment here (or you can bring the meter itself).  You can write it on any piece of paper.  please call us sooner if your blood sugar goes below 70, or if you have a lot of readings over 200.  Please reduce the glipizide to breakfast only, and:   increase the Novolog to 12 units twice a day (before meals).   We will need to take this complex situation in stages.   On Monday, for your surgery, skip the glipizide altogether.  Resume it Tuesday morning.  Resume novolog when eating again. Please come back for a  follow-up appointment in 1 month.

## 2020-10-13 NOTE — H&P (View-Only) (Signed)
Office Note     CC:  follow up Requesting Provider:  Heywood Bene, *  HPI: Duane Hall is a 64 y.o. (September 29, 1956) male who presents for 3 month follow up s/p LLE SFA intervention by Dr. Donzetta Matters. He underwent laser atherectomy and DCB angioplasty of left SFA on 6/218/2021. He has history of bilateral heel wounds followed by Dr. Sherryle Lis. He had palpable bilateral PT pulses at last office visit and returns today for assessment and follow-up non-invasive studies.  His wife accompanies him today.  He denies rest pain, but she states he has been complaining of increased lower extremity pain.  Denies fever or chills.  He continues heel wound care by Dr. Sherryle Lis.  States blood sugars have been fluctuating.  Compliant with asa, statin and Plavix History of ESRD Past Medical History:  Diagnosis Date  . Asthma   . Cancer ([place])   . Cataract   . CKD (chronic kidney disease), stage III ([place])   . Diabetes mellitus   . Glaucoma   . Vascular insufficiency 05/2020    Past Surgical History:  Procedure Laterality Date  . ABDOMINAL AORTOGRAM W/LOWER EXTREMITY N/A 06/19/2020   Procedure: ABDOMINAL AORTOGRAM W/LOWER EXTREMITY;  Surgeon: Waynetta Sandy, MD;  Location: [facility];  Service: Cardiovascular;  Laterality: N/A;  . AV FISTULA PLACEMENT Right 09/14/2019   Procedure: Right Arm Basilic Vein transposition;  Surgeon: Angelia Mould, MD;  Location: [facility];  Service: Vascular;  Laterality: Right;  . BONE BIOPSY Left 07/29/2020   Procedure: BONE BIOPSY;  Surgeon: [name], DPM;  Location: [facility];  Service: Podiatry;  Laterality: Left;  Need bone trephines and/or large bore Giamshidi  . COLONOSCOPY    . PERIPHERAL VASCULAR ATHERECTOMY Left 06/19/2020   Procedure: PERIPHERAL VASCULAR ATHERECTOMY;  Surgeon: Waynetta Sandy, MD;  Location: [facility];  Service: Cardiovascular;  Laterality: Left;  SFA  . UPPER GASTROINTESTINAL ENDOSCOPY  02/2019    Dr Benson Norway      Social History   Socioeconomic History  . Marital status: Married    Spouse name: Not on file  . Number of children: Not on file  . Years of education: Not on file  . Highest education level: Not on file  Occupational History  . Occupation: Chief Financial Officer  Tobacco Use  . Smoking status: Current Every Day Smoker    Packs/day: 0.30    Types: Cigarettes  . Smokeless tobacco: Never Used  Vaping Use  . Vaping Use: Never used  Substance and Sexual Activity  . Alcohol use: No    Alcohol/week: 0.0 standard drinks  . Drug use: No  . Sexual activity: Not on file  Other Topics Concern  . Not on file  Social History Narrative   Married.    Social Determinants of Health   Financial Resource Strain:   . Difficulty of Paying Living Expenses: Not on file  Food Insecurity:   . Worried About Charity fundraiser in the Last Year: Not on file  . Ran Out of Food in the Last Year: Not on file  Transportation Needs:   . Lack of Transportation (Medical): Not on file  . Lack of Transportation (Non-Medical): Not on file  Physical Activity:   . Days of Exercise per Week: Not on file  . Minutes of Exercise per Session: Not on file  Stress:   . Feeling of Stress : Not on file  Social Connections:   . Frequency of Communication with Friends  and Family: Not on file  . Frequency of Social Gatherings with Friends and Family: Not on file  . Attends Religious Services: Not on file  . Active Member of Clubs or Organizations: Not on file  . Attends Archivist Meetings: Not on file  . Marital Status: Not on file  Intimate Partner Violence:   . Fear of Current or Ex-Partner: Not on file  . Emotionally Abused: Not on file  . Physically Abused: Not on file  . Sexually Abused: Not on file   Family History  Problem Relation Age of Onset  . Cancer Father   . Diabetes Mother     Current Outpatient Medications  Medication Sig Dispense Refill  . aspirin EC 81 MG EC tablet Take 1  tablet (81 mg total) by mouth daily. Swallow whole. 30 tablet 11  . clopidogrel (PLAVIX) 75 MG tablet Take 1 tablet (75 mg total) by mouth daily with breakfast. 30 tablet 3  . collagenase (SANTYL) ointment Apply 1 application topically daily. Apply to the left heel daily with a layer the thickness of a nickel 30 g 2  . Continuous Blood Gluc Receiver (FREESTYLE LIBRE READER) DEVI 1 each by Does not apply route See admin instructions. WILL NOT COMPLETE PA. DOES NOT MEET CRITERIA. WILL PAY OUT OF POCKET 1 each 0  . Continuous Blood Gluc Sensor (FREESTYLE LIBRE 14 DAY SENSOR) MISC 1 each by Does not apply route every 14 (fourteen) days. WILL NOT COMPLETE PA. DOES NOT MEET CRITERIA. WILL PAY OUT OF POCKET 2 each 2  . ferric citrate (AURYXIA) 1 GM 210 MG(Fe) tablet Take 210 mg by mouth 3 (three) times daily with meals.     . gabapentin (NEURONTIN) 100 MG capsule Take 300 mg by mouth daily.    Marland Kitchen glipiZIDE (GLUCOTROL) 10 MG tablet Take 1 tablet (10 mg total) by mouth daily before breakfast. (Patient taking differently: Take 10 mg by mouth 2 (two) times daily. ) 14 tablet 0  . GVOKE HYPOPEN 1-PACK 1 MG/0.2ML SOAJ Inject into the skin.    Marland Kitchen insulin aspart (NOVOLOG FLEXPEN) 100 UNIT/ML FlexPen Inject 10 Units into the skin 2 (two) times daily. 6 mL 2  . lidocaine-prilocaine (EMLA) cream Apply 1 application topically See admin instructions. Apply topically to port access one hour prior to dialysis on Sunday, Monday, Wednesday, Thursday    . Methoxy PEG-Epoetin Beta (MIRCERA IJ) Inject into the skin.    . multivitamin (RENA-VIT) TABS tablet Take 1 tablet by mouth daily.    . mupirocin ointment (BACTROBAN) 2 % Apply 1 application topically See admin instructions. Apply topically to port access after dialysis - Sunday, Monday, Wednesday, Thursday (Patient not taking: Reported on 08/28/2020)    . omeprazole (PRILOSEC) 40 MG capsule Take 40 mg by mouth daily.    . Probiotic Product (PROBIOTIC DAILY PO) Take 1 each by  mouth.    . rosuvastatin (CRESTOR) 10 MG tablet TAKE 1 TABLET BY MOUTH DAILY 30 tablet 11  . sennosides-docusate sodium (SENOKOT-S) 8.6-50 MG tablet Take 1 tablet by mouth daily as needed for constipation.     No current facility-administered medications for this visit.    Allergies  Allergen Reactions  . Midodrine     Other reaction(s): see below     REVIEW OF SYSTEMS:   [X]  denotes positive finding, [ ]  denotes negative finding Cardiac  Comments:  Chest pain or chest pressure:    Shortness of breath upon exertion:    Short of breath  when lying flat:    Irregular heart rhythm:        Vascular    Pain in calf, thigh, or hip brought on by ambulation:    Pain in feet at night that wakes you up from your sleep:     Blood clot in your veins:    Leg swelling:         Pulmonary    Oxygen at home:    Productive cough:     Wheezing:         Neurologic    Sudden weakness in arms or legs:     Sudden numbness in arms or legs:     Sudden onset of difficulty speaking or slurred speech:    Temporary loss of vision in one eye:     Problems with dizziness:         Gastrointestinal    Blood in stool:     Vomited blood:         Genitourinary    Burning when urinating:     Blood in urine:        Psychiatric    Major depression:         Hematologic    Bleeding problems:    Problems with blood clotting too easily:        Skin    Rashes or ulcers:        Constitutional    Fever or chills:      PHYSICAL EXAMINATION:  Vitals:   10/13/20 0855  BP: (!) 94/58  Pulse: 80  Resp: 20  Temp: 98.6 F (37 C)  SpO2: 100%   General:  WDWN in NAD; vital signs documented above Gait: Not observed/does not ambulate due to heel wounds. HENT: WNL, normocephalic Pulmonary: normal non-labored breathing , without Rales, rhonchi,  wheezing Cardiac: regular HR,  Skin: without rashes Vascular Exam/Pulses: I am unable to palpate pedal pulses.  He has dampened monophasic anterior tibial  and peroneal signals bilaterally.  Dominant brisk monophasic posterior tibial pulses bilaterally. Extremities: with chronic ischemic changes, without Gangrene , without cellulitis; with open wounds; right heel wound appears to have epithelialized.  Left heel wound with healthy underlying tissue and granulation buds apparent Musculoskeletal: no muscle wasting or atrophy  Neurologic: A&O X 3;  No focal weakness or paresthesias are detected Psychiatric:  The pt has Normal affect.       Non-Invasive Vascular Imaging:   10/13/2020 ABI/TBIToday's ABIToday's TBIPrevious ABIPrevious TBI  +-------+-----------+-----------+------------+------------+  Right 0.7    0.81    0.9     1.23      +-------+-----------+-----------+------------+------------+  Left  0.76    0.26    0.97    0.49      +-------+-----------+-----------+------------+------------+  Right: 50-74% stenosis noted in the deep femoral artery. 50-74% stenosis  noted in the superficial femoral artery.  Right great toe pressure 93  Left: 50-74% stenosis noted in the superficial femoral artery. 30-49%  stenosis noted in the popliteal artery.  Left great toe pressure 30  Postprocedure studies on 07/14/2020: ABIs to be right 0.9 left 0.97 with toe pressure on the right 171 and left 66.  Left SFA proximally has 75 to 99% stenosis with monophasic waveforms distally.   ASSESSMENT/PLAN:: 64 y.o. male here for follow up for peripheral arterial disease status post angioplasty and laser atherectomy of left SFA.  His ABIs are decreased today and his toe pressures have dropped significantly.  I reviewed the data and physical exam findings with  Dr. Donzetta Matters this morning.  He recommends arteriogram with likely intervention and the patient and his wife agree with this plan.  Continue aspirin, Plavix and statin; tight glucose control; local wound care.  Barbie Banner, PA-C [facility] [phone]  Clinic MD:   Donzetta Matters

## 2020-10-15 ENCOUNTER — Encounter: Payer: Self-pay | Admitting: Endocrinology

## 2020-10-16 ENCOUNTER — Encounter (HOSPITAL_COMMUNITY): Payer: Self-pay | Admitting: Vascular Surgery

## 2020-10-16 ENCOUNTER — Other Ambulatory Visit: Payer: Self-pay

## 2020-10-16 ENCOUNTER — Encounter (HOSPITAL_COMMUNITY)
Admission: RE | Disposition: A | Discharge status: Home / Self Care | Payer: Self-pay | Source: Home / Self Care | Attending: Vascular Surgery

## 2020-10-16 ENCOUNTER — Ambulatory Visit (HOSPITAL_COMMUNITY)
Admission: RE | Admit: 2020-10-16 | Discharge: 2020-10-16 | Disposition: A | Discharge status: Home / Self Care | Payer: BC Managed Care – PPO | Attending: Vascular Surgery | Admitting: Vascular Surgery

## 2020-10-16 DIAGNOSIS — L97429 Non-pressure chronic ulcer of left heel and midfoot with unspecified severity: Secondary | ICD-10-CM | POA: Diagnosis not present

## 2020-10-16 DIAGNOSIS — F17210 Nicotine dependence, cigarettes, uncomplicated: Secondary | ICD-10-CM | POA: Diagnosis not present

## 2020-10-16 DIAGNOSIS — Z7982 Long term (current) use of aspirin: Secondary | ICD-10-CM | POA: Insufficient documentation

## 2020-10-16 DIAGNOSIS — I70244 Atherosclerosis of native arteries of left leg with ulceration of heel and midfoot: Secondary | ICD-10-CM | POA: Diagnosis not present

## 2020-10-16 DIAGNOSIS — E1122 Type 2 diabetes mellitus with diabetic chronic kidney disease: Secondary | ICD-10-CM | POA: Diagnosis not present

## 2020-10-16 DIAGNOSIS — E11621 Type 2 diabetes mellitus with foot ulcer: Secondary | ICD-10-CM | POA: Insufficient documentation

## 2020-10-16 DIAGNOSIS — L97419 Non-pressure chronic ulcer of right heel and midfoot with unspecified severity: Secondary | ICD-10-CM | POA: Insufficient documentation

## 2020-10-16 DIAGNOSIS — N183 Chronic kidney disease, stage 3 unspecified: Secondary | ICD-10-CM | POA: Diagnosis not present

## 2020-10-16 DIAGNOSIS — Z79899 Other long term (current) drug therapy: Secondary | ICD-10-CM | POA: Insufficient documentation

## 2020-10-16 DIAGNOSIS — I70213 Atherosclerosis of native arteries of extremities with intermittent claudication, bilateral legs: Secondary | ICD-10-CM | POA: Diagnosis not present

## 2020-10-16 DIAGNOSIS — Z7902 Long term (current) use of antithrombotics/antiplatelets: Secondary | ICD-10-CM | POA: Diagnosis not present

## 2020-10-16 DIAGNOSIS — Z794 Long term (current) use of insulin: Secondary | ICD-10-CM | POA: Insufficient documentation

## 2020-10-16 DIAGNOSIS — E1151 Type 2 diabetes mellitus with diabetic peripheral angiopathy without gangrene: Secondary | ICD-10-CM | POA: Diagnosis not present

## 2020-10-16 DIAGNOSIS — I70234 Atherosclerosis of native arteries of right leg with ulceration of heel and midfoot: Secondary | ICD-10-CM | POA: Diagnosis not present

## 2020-10-16 DIAGNOSIS — J45909 Unspecified asthma, uncomplicated: Secondary | ICD-10-CM | POA: Diagnosis not present

## 2020-10-16 DIAGNOSIS — I998 Other disorder of circulatory system: Secondary | ICD-10-CM | POA: Diagnosis not present

## 2020-10-16 HISTORY — PX: ABDOMINAL AORTOGRAM W/LOWER EXTREMITY: CATH118223

## 2020-10-16 LAB — POCT I-STAT, CHEM 8
BUN: 46 mg/dL — ABNORMAL HIGH (ref 8–23)
Calcium, Ion: 1.05 mmol/L — ABNORMAL LOW (ref 1.15–1.40)
Chloride: 98 mmol/L (ref 98–111)
Creatinine, Ser: 5.7 mg/dL — ABNORMAL HIGH (ref 0.61–1.24)
Glucose, Bld: 212 mg/dL — ABNORMAL HIGH (ref 70–99)
HCT: 30 % — ABNORMAL LOW (ref 39.0–52.0)
Hemoglobin: 10.2 g/dL — ABNORMAL LOW (ref 13.0–17.0)
Potassium: 3.6 mmol/L (ref 3.5–5.1)
Sodium: 141 mmol/L (ref 135–145)
TCO2: 29 mmol/L (ref 22–32)

## 2020-10-16 LAB — GLUCOSE, CAPILLARY: Glucose-Capillary: 266 mg/dL — ABNORMAL HIGH (ref 70–99)

## 2020-10-16 SURGERY — ABDOMINAL AORTOGRAM W/LOWER EXTREMITY
Anesthesia: LOCAL | Laterality: Left

## 2020-10-16 MED ORDER — HEPARIN (PORCINE) IN NACL 1000-0.9 UT/500ML-% IV SOLN
INTRAVENOUS | Status: DC | PRN
  Administered 2020-10-16 (×2): 500 mL

## 2020-10-16 MED ORDER — MIDAZOLAM HCL 2 MG/2ML IJ SOLN
INTRAMUSCULAR | Status: DC | PRN
  Administered 2020-10-16: 1 mg via INTRAVENOUS

## 2020-10-16 MED ORDER — IODIXANOL 320 MG/ML IV SOLN
INTRAVENOUS | Status: DC | PRN
  Administered 2020-10-16: 115 mL via INTRA_ARTERIAL

## 2020-10-16 MED ORDER — LIDOCAINE HCL (PF) 1 % IJ SOLN
INTRAMUSCULAR | Status: AC
  Filled 2020-10-16: qty 30

## 2020-10-16 MED ORDER — MIDAZOLAM HCL 2 MG/2ML IJ SOLN
INTRAMUSCULAR | Status: AC
  Filled 2020-10-16: qty 2

## 2020-10-16 MED ORDER — LIDOCAINE HCL (PF) 1 % IJ SOLN
INTRAMUSCULAR | Status: DC | PRN
  Administered 2020-10-16: 15 mL via INTRADERMAL

## 2020-10-16 MED ORDER — FENTANYL CITRATE (PF) 100 MCG/2ML IJ SOLN
INTRAMUSCULAR | Status: DC | PRN
  Administered 2020-10-16: 50 ug via INTRAVENOUS

## 2020-10-16 MED ORDER — HEPARIN (PORCINE) IN NACL 1000-0.9 UT/500ML-% IV SOLN
INTRAVENOUS | Status: AC
  Filled 2020-10-16: qty 1000

## 2020-10-16 MED ORDER — FENTANYL CITRATE (PF) 100 MCG/2ML IJ SOLN
INTRAMUSCULAR | Status: AC
  Filled 2020-10-16: qty 2

## 2020-10-16 SURGICAL SUPPLY — 10 items
CATH OMNI FLUSH 5F 65CM (CATHETERS) ×2 IMPLANT
CLOSURE MYNX CONTROL 5F (Vascular Products) ×2 IMPLANT
KIT MICROPUNCTURE NIT STIFF (SHEATH) ×2 IMPLANT
KIT PV (KITS) ×2 IMPLANT
SHEATH PINNACLE 5F 10CM (SHEATH) ×2 IMPLANT
SHEATH PROBE COVER 6X72 (BAG) ×2 IMPLANT
SYR MEDRAD MARK V 150ML (SYRINGE) ×2 IMPLANT
TRANSDUCER W/STOPCOCK (MISCELLANEOUS) ×2 IMPLANT
TRAY PV CATH (CUSTOM PROCEDURE TRAY) ×2 IMPLANT
WIRE BENTSON .035X145CM (WIRE) ×2 IMPLANT

## 2020-10-16 NOTE — Discharge Instructions (Signed)
Femoral Site Care This sheet gives you information about how to care for yourself after your procedure. Your health care provider may also give you more specific instructions. If you have problems or questions, contact your health care provider. What can I expect after the procedure? After the procedure, it is common to have:  Bruising that usually fades within 1-2 weeks.  Tenderness at the site. Follow these instructions at home: Wound care  Follow instructions from your health care provider about how to take care of your insertion site. Make sure you: ? Wash your hands with soap and water before you change your bandage (dressing). If soap and water are not available, use hand sanitizer. ? Change your dressing as told by your health care provider. ? Leave stitches (sutures), skin glue, or adhesive strips in place. These skin closures may need to stay in place for 2 weeks or longer. If adhesive strip edges start to loosen and curl up, you may trim the loose edges. Do not remove adhesive strips completely unless your health care provider tells you to do that.  Do not take baths, swim, or use a hot tub until your health care provider approves.  You may shower 24-48 hours after the procedure or as told by your health care provider. ? Gently wash the site with plain soap and water. ? Pat the area dry with a clean towel. ? Do not rub the site. This may cause bleeding.  Do not apply powder or lotion to the site. Keep the site clean and dry.  Check your femoral site every day for signs of infection. Check for: ? Redness, swelling, or pain. ? Fluid or blood. ? Warmth. ? Pus or a bad smell. Activity  For the first 2-3 days after your procedure, or as long as directed: ? Avoid climbing stairs as much as possible. ? Do not squat.  Do not lift anything that is heavier than 10 lb (4.5 kg), or the limit that you are told, until your health care provider says that it is safe.  Rest as  directed. ? Avoid sitting for a long time without moving. Get up to take short walks every 1-2 hours.  Do not drive for 24 hours if you were given a medicine to help you relax (sedative). General instructions  Take over-the-counter and prescription medicines only as told by your health care provider.  Keep all follow-up visits as told by your health care provider. This is important. Contact a health care provider if you have:  A fever or chills.  You have redness, swelling, or pain around your insertion site. Get help right away if:  The catheter insertion area swells very fast.  You pass out.  You suddenly start to sweat or your skin gets clammy.  The catheter insertion area is bleeding, and the bleeding does not stop when you hold steady pressure on the area.  The area near or just beyond the catheter insertion site becomes pale, cool, tingly, or numb. These symptoms may represent a serious problem that is an emergency. Do not wait to see if the symptoms will go away. Get medical help right away. Call your local emergency services (911 in the [place] Do not drive yourself to the hospital. Summary  After the procedure, it is common to have bruising that usually fades within 1-2 weeks.  Check your femoral site every day for signs of infection.  Do not lift anything that is heavier than 10 lb (4.5 kg), or the   limit that you are told, until your health care provider says that it is safe. This information is not intended to replace advice given to you by your health care provider. Make sure you discuss any questions you have with your health care provider. Document Revised: 12/29/2017 Document Reviewed: 12/29/2017 Elsevier Patient Education  2020 [employer]  

## 2020-10-16 NOTE — Interval H&P Note (Signed)
History and Physical Interval Note:  10/16/2020 11:15 AM  [name]  has presented today for surgery, with the diagnosis of PAD.  The various methods of treatment have been discussed with the patient and family. After consideration of risks, benefits and other options for treatment, the patient has consented to  Procedure(s): ABDOMINAL AORTOGRAM W/LOWER EXTREMITY (Left) as a surgical intervention.  The patient's history has been reviewed, patient examined, no change in status, stable for surgery.  I have reviewed the patient's chart and labs.  Questions were answered to the patient's satisfaction.     Duane Hall

## 2020-10-16 NOTE — Op Note (Signed)
    Patient name: Duane Hall MRN: [id number] DOB: 1956-08-15 Sex: male  10/16/2020 Pre-operative Diagnosis: Critical left lower extremity ischemia with healing left heel ulcer Post-operative diagnosis: Left heel ulcer without flow-limiting stenosis Surgeon:  Eda Paschal. Donzetta Matters, MD Procedure Performed: 1.  Ultrasound-guided cannulation right common femoral artery 2.  Aortogram with bilateral lower extremity angiography 3.  Selection of left common femoral artery 4.  Moderate sedation with fentanyl and Versed for 26 minutes 5.  Mynx device closure right common femoral artery   Indications: 63 year old male with a history of heel ulceration.  The right side is nearly healed the left side is improving but he has evidence of 50 to 74% stenosis in the proximal SFA.  He is indicated for angiography with possible intervention of the left lower extremity.  Findings: The aorta demonstrates no flow-limiting stenosis as do the iliac arteries.  The renal arteries are diminutive consistent with his dialysis requirement.  Left SFA appears patent with less than 30% stenoses dominant runoff is via the posterior tibial artery which does fill the foot well.  On the right side there are 2 areas of approximately 50% stenosis in the mid and distal SFA these were not treated given that we had right common femoral access and the wound is healing well.  We will continue medical management for now.   Procedure:  The patient was identified in the holding area and taken to room 8.  The patient was then placed supine on the table and prepped and draped in the usual sterile fashion.  A time out was called.  Ultrasound was used to evaluate the right common femoral artery.  This was noted be patent and compressible.  The area was anesthetized with 1% lidocaine.  Moderate sedation with fentanyl and Versed was administered and his vital signs were monitored throughout the case.  We placed a micropuncture needle into the common  femoral artery on the right under ultrasound guidance and images saved the permanent record.  Sheath was placed followed by Bentson wire 5 French sheath.  Omni catheter was placed to the level of L1 aortogram was performed followed by bilateral lower extremity runoff.  We then crossed the bifurcation with Bentson wire and Omni catheter.  We performed angiography of the left common femoral artery which demonstrated no flow-limiting stenosis.  Catheter was removed over wire.  Spot angiography of the right lower extremity was performed in an angled view to evaluate the SFA which demonstrated only 50% nonflow limiting stenosis.  Satisfied we then placed a closure device in the common femoral artery.  He tolerated procedure well any complication.  Contrast: 115cc   Krystyna Cleckley C. Donzetta Matters, MD [facility] Office: [phone] Pager: [phone]

## 2020-10-16 NOTE — Progress Notes (Signed)
Up and walked and tolerated well; right groin stable, no bleeding or hematoma 

## 2020-10-20 DIAGNOSIS — L89623 Pressure ulcer of left heel, stage 3: Secondary | ICD-10-CM | POA: Diagnosis not present

## 2020-10-20 DIAGNOSIS — I7300 Raynaud's syndrome without gangrene: Secondary | ICD-10-CM | POA: Diagnosis not present

## 2020-10-20 DIAGNOSIS — L8996 Pressure-induced deep tissue damage of unspecified site: Secondary | ICD-10-CM | POA: Diagnosis not present

## 2020-10-23 DIAGNOSIS — L89620 Pressure ulcer of left heel, unstageable: Secondary | ICD-10-CM | POA: Diagnosis not present

## 2020-10-29 DIAGNOSIS — N186 End stage renal disease: Secondary | ICD-10-CM | POA: Diagnosis not present

## 2020-10-29 DIAGNOSIS — E1129 Type 2 diabetes mellitus with other diabetic kidney complication: Secondary | ICD-10-CM | POA: Diagnosis not present

## 2020-10-29 DIAGNOSIS — Z992 Dependence on renal dialysis: Secondary | ICD-10-CM | POA: Diagnosis not present

## 2020-11-03 ENCOUNTER — Ambulatory Visit (INDEPENDENT_AMBULATORY_CARE_PROVIDER_SITE_OTHER): Payer: BC Managed Care – PPO | Admitting: Podiatry

## 2020-11-03 ENCOUNTER — Other Ambulatory Visit: Payer: Self-pay

## 2020-11-03 ENCOUNTER — Encounter: Payer: Self-pay | Admitting: Podiatry

## 2020-11-03 DIAGNOSIS — L89623 Pressure ulcer of left heel, stage 3: Secondary | ICD-10-CM | POA: Diagnosis not present

## 2020-11-03 DIAGNOSIS — N186 End stage renal disease: Secondary | ICD-10-CM

## 2020-11-03 DIAGNOSIS — L89613 Pressure ulcer of right heel, stage 3: Secondary | ICD-10-CM | POA: Diagnosis not present

## 2020-11-03 DIAGNOSIS — I739 Peripheral vascular disease, unspecified: Secondary | ICD-10-CM | POA: Diagnosis not present

## 2020-11-03 DIAGNOSIS — F17200 Nicotine dependence, unspecified, uncomplicated: Secondary | ICD-10-CM

## 2020-11-03 DIAGNOSIS — E1169 Type 2 diabetes mellitus with other specified complication: Secondary | ICD-10-CM

## 2020-11-03 NOTE — Progress Notes (Signed)
     Subjective:  Patient ID: Duane Hall, male    DOB: [birth year],  MRN: [id number]  Follow-up bilateral heel ulcers  64 y.o. male presents with the above complaint. History confirmed with patient.   Here with his wife today.  Wounds have been stable at home.   Objective:  Physical Exam:   Minimal edema and erythema bilaterally.  Nearly full granular base with, surrounding hyperkeratosis and nonviable tissue.  Post debridement dimensions here are 6.0x4.5 cm x 0.2 cm.  Right posterior heel ulcer has fully healed.  No signs of infection of either wound.            Assessment:   1. Pressure injury of left heel, stage 3 ([place])   2. PAD (peripheral artery disease) ([place])   3. Pressure injury of right heel, stage 3 ([place])   4. Current smoker   5. ESRD (end stage renal disease) ([place])   6. Type 2 diabetes mellitus with other specified complication, without long-term current use of insulin ([place])      Plan:  Patient was evaluated and treated and all questions answered.  -Debridement of left heel margins performed below -Right side nearly healed - Continue offloading while in bed -Continue local wound care at home with Santyl and wet-to-dry dressings daily or every other day. A -May bear full weight on the right foot, toe-touch weightbearing on the left foot   Ulcer bilateral heel -Debridement as below. -Dressed with Santyl, DSD. -Continue off-loading with surgical shoe.  Procedure: Excisional Debridement of left heel wound Rationale: Removal of non-viable soft tissue from the wound to promote healing.  Anesthesia: none Pre-Debridement Wound Measurements: 6.5 x 4.5 x 0.2 Post-Debridement Wound Measurements: Same as predebridement Type of Debridement: Sharp selective Tissue Removed: Non-viable soft tissue Depth of Debridement: subcutaneous tissue. Technique: Sharp selective debridement to bleeding, viable wound base.  Dressing: Dry, sterile, compression  dressing. Disposition: Patient tolerated procedure well.      At the end of his treatment visit, he became flush and hypoglycemic and a fingerstick blood glucose was taken and was 50 mg/dL.  He was given juice, some candy and glucagon tablets.  He was monitored and discharged home in good condition once his blood sugar had stabilized and his vitals were normal.  My partner Dr. Jacqualyn Posey and her medical assistance assisted me in treating him for this during this episode while I was with another patient

## 2020-11-07 ENCOUNTER — Other Ambulatory Visit: Payer: Self-pay

## 2020-11-07 DIAGNOSIS — I739 Peripheral vascular disease, unspecified: Secondary | ICD-10-CM

## 2020-11-10 ENCOUNTER — Encounter: Payer: Self-pay | Admitting: Endocrinology

## 2020-11-10 ENCOUNTER — Other Ambulatory Visit: Payer: Self-pay

## 2020-11-10 ENCOUNTER — Ambulatory Visit: Payer: BC Managed Care – PPO | Admitting: Endocrinology

## 2020-11-10 VITALS — BP 98/74 | HR 74 | Wt 171.0 lb

## 2020-11-10 DIAGNOSIS — N186 End stage renal disease: Secondary | ICD-10-CM

## 2020-11-10 DIAGNOSIS — E1122 Type 2 diabetes mellitus with diabetic chronic kidney disease: Secondary | ICD-10-CM | POA: Diagnosis not present

## 2020-11-10 DIAGNOSIS — Z992 Dependence on renal dialysis: Secondary | ICD-10-CM | POA: Diagnosis not present

## 2020-11-10 LAB — POCT GLYCOSYLATED HEMOGLOBIN (HGB A1C): Hemoglobin A1C: 7.8 % — AB (ref 4.0–5.6)

## 2020-11-10 MED ORDER — GLIPIZIDE 5 MG PO TABS: 5.0000 mg | ORAL_TABLET | Freq: Every day | ORAL | 3 refills | Status: DC

## 2020-11-10 MED ORDER — NOVOLOG FLEXPEN 100 UNIT/ML ~~LOC~~ SOPN: PEN_INJECTOR | SUBCUTANEOUS | 2 refills | Status: DC

## 2020-11-10 NOTE — Progress Notes (Signed)
Subjective:    Patient ID: Duane Hall, male    DOB: [birth year], 64 y.o.   MRN: [id number]  HPI Pt returns for f/u of diabetes mellitus: DM type: Insulin-requiring type 2, but he is at risk for evolving type 1.  Dx'ed: 7673 Complications: ESRD, foot ulcers, GP, PAD, and PDR.   Therapy: insulin since 2021 DKA: never Severe hypoglycemia: never.  Pancreatitis: never Pancreatic imaging:  SDOH: Wife provides most hx, due to pt's poor overall health. Other: fructosamine converts to higher avg glucose than A1c, prob due to renal failure; he has [place] continuous glucose monitor; He stopped Trulicity, due to GP; He eats small breakfast and lunch, and a larger dinner Interval history: I reviewed continuous glucose monitor data.  Glucose varies from 50-360.  It is in general highest at [place], and lowest at [place].  It increases overnight.  He takes glipizide 10 mg QAM.  He has hypoglycemia approx twice per week.   Past Medical History:  Diagnosis Date  . Asthma   . Cancer ([place])   . Cataract   . CKD (chronic kidney disease), stage III ([place])   . Diabetes mellitus   . Glaucoma   . Vascular insufficiency 05/2020    Past Surgical History:  Procedure Laterality Date  . ABDOMINAL AORTOGRAM W/LOWER EXTREMITY N/A 06/19/2020   Procedure: ABDOMINAL AORTOGRAM W/LOWER EXTREMITY;  Surgeon: Waynetta Sandy, MD;  Location: [facility];  Service: Cardiovascular;  Laterality: N/A;  . ABDOMINAL AORTOGRAM W/LOWER EXTREMITY Left 10/16/2020   Procedure: ABDOMINAL AORTOGRAM W/LOWER EXTREMITY;  Surgeon: Waynetta Sandy, MD;  Location: [facility];  Service: Cardiovascular;  Laterality: Left;  . AV FISTULA PLACEMENT Right 09/14/2019   Procedure: Right Arm Basilic Vein transposition;  Surgeon: Angelia Mould, MD;  Location: [facility];  Service: Vascular;  Laterality: Right;  . BONE BIOPSY Left 07/29/2020   Procedure: BONE BIOPSY;  Surgeon: [name], DPM;  Location: [facility];   Service: Podiatry;  Laterality: Left;  Need bone trephines and/or large bore Giamshidi  . COLONOSCOPY    . PERIPHERAL VASCULAR ATHERECTOMY Left 06/19/2020   Procedure: PERIPHERAL VASCULAR ATHERECTOMY;  Surgeon: Waynetta Sandy, MD;  Location: [facility];  Service: Cardiovascular;  Laterality: Left;  SFA  . UPPER GASTROINTESTINAL ENDOSCOPY  02/2019   Dr Benson [place]      Social History   Socioeconomic History  . Marital status: Married    Spouse name: Not on file  . Number of children: Not on file  . Years of education: Not on file  . Highest education level: Not on file  Occupational History  . Occupation: Chief Financial Officer  Tobacco Use  . Smoking status: Current Every Day Smoker    Packs/day: 0.30    Types: Cigarettes  . Smokeless tobacco: Never Used  Vaping Use  . Vaping Use: Never used  Substance and Sexual Activity  . Alcohol use: No    Alcohol/week: 0.0 standard drinks  . Drug use: No  . Sexual activity: Not on file  Other Topics Concern  . Not on file  Social History Narrative   Married.    Social Determinants of Health   Financial Resource Strain:   . Difficulty of Paying Living Expenses: Not on file  Food Insecurity:   . Worried About Charity fundraiser in the Last Year: Not on file  . Ran Out of Food in the Last Year: Not on file  Transportation Needs:   . Lack of  Transportation (Medical): Not on file  . Lack of Transportation (Non-Medical): Not on file  Physical Activity:   . Days of Exercise per Week: Not on file  . Minutes of Exercise per Session: Not on file  Stress:   . Feeling of Stress : Not on file  Social Connections:   . Frequency of Communication with Friends and Family: Not on file  . Frequency of Social Gatherings with Friends and Family: Not on file  . Attends Religious Services: Not on file  . Active Member of Clubs or Organizations: Not on file  . Attends Archivist Meetings: Not on file  . Marital Status: Not on file   Intimate Partner Violence:   . Fear of Current or Ex-Partner: Not on file  . Emotionally Abused: Not on file  . Physically Abused: Not on file  . Sexually Abused: Not on file    Current Outpatient Medications on File Prior to Visit  Medication Sig Dispense Refill  . aspirin EC 81 MG EC tablet Take 1 tablet (81 mg total) by mouth daily. Swallow whole. 30 tablet 11  . clopidogrel (PLAVIX) 75 MG tablet Take 1 tablet (75 mg total) by mouth daily with breakfast. 30 tablet 3  . collagenase (SANTYL) ointment Apply 1 application topically daily. Apply to the left heel daily with a layer the thickness of a nickel 30 g 2  . Continuous Blood Gluc Receiver (FREESTYLE LIBRE READER) DEVI 1 each by Does not apply route See admin instructions. WILL NOT COMPLETE PA. DOES NOT MEET CRITERIA. WILL PAY OUT OF POCKET 1 each 0  . Continuous Blood Gluc Sensor (FREESTYLE LIBRE 14 DAY SENSOR) MISC 1 each by Does not apply route every 14 (fourteen) days. WILL NOT COMPLETE PA. DOES NOT MEET CRITERIA. WILL PAY OUT OF POCKET 2 each 2  . ferric citrate (AURYXIA) 1 GM 210 MG(Fe) tablet Take 210 mg by mouth in the morning and at bedtime.     . gabapentin (NEURONTIN) 100 MG capsule Take 100 mg by mouth 3 (three) times daily.    Marland Kitchen gabapentin (NEURONTIN) 300 MG capsule Take 300 mg by mouth daily.     Marland Kitchen GVOKE HYPOPEN 1-PACK 1 MG/0.2ML SOAJ Inject 1 mg into the skin as needed (low blood sugar).     Marland Kitchen lidocaine-prilocaine (EMLA) cream Apply 1 application topically See admin instructions. Apply topically to port access one hour prior to dialysis on Sunday, Monday, Wednesday, Thursday    . Methoxy PEG-Epoetin Beta (MIRCERA IJ) Inject into the skin every 30 (thirty) days.     . multivitamin (RENA-VIT) TABS tablet Take 1 tablet by mouth daily.    Marland Kitchen omeprazole (PRILOSEC) 40 MG capsule Take 40 mg by mouth daily.    . Probiotic Product (PROBIOTIC DAILY PO) Take 1 each by mouth at bedtime.     . rosuvastatin (CRESTOR) 10 MG tablet TAKE 1  TABLET BY MOUTH DAILY (Patient taking differently: Take 10 mg by mouth daily. ) 30 tablet 11  . sennosides-docusate sodium (SENOKOT-S) 8.6-50 MG tablet Take 1 tablet by mouth at bedtime.      Current Facility-Administered Medications on File Prior to Visit  Medication Dose Route Frequency Provider Last Rate Last Admin  . 0.9 %  sodium chloride infusion  250 mL Intravenous PRN Waynetta Sandy, MD      . sodium chloride flush (NS) 0.9 % injection 3 mL  3 mL Intravenous Q12H Waynetta Sandy, MD      . sodium chloride  flush (NS) 0.9 % injection 3 mL  3 mL Intravenous PRN Waynetta Sandy, MD        Allergies  Allergen Reactions  . Augmentin [Amoxicillin-Pot Clavulanate] Diarrhea  . Midodrine Other (See Comments)    Other reaction(s): Urinary Sensation    Family History  Problem Relation Age of Onset  . Cancer Father   . Diabetes Mother     BP 98/74   Pulse 74   Wt 171 lb (77.6 kg)   SpO2 98%   BMI 25.25 kg/m    Review of Systems Denies LOC.      Objective:   Physical Exam VITAL SIGNS:  See vs page.   GENERAL: no distress.  In wheelchair EXT: both feet are bandaged (sees wound care).     Lab Results  Component Value Date   CREATININE 5.70 (H) 10/16/2020   BUN 46 (H) 10/16/2020   NA 141 10/16/2020   K 3.6 10/16/2020   CL 98 10/16/2020   CO2 27 07/31/2020      Assessment & Plan:  Insulin-requiring type 2 DM, with ESRD: uncontrolled.  Goal is to phase out glipizide.    Patient Instructions  check your blood sugar once a day.  vary the time of day when you check, between before the 3 meals, and at bedtime.  also check if you have symptoms of your blood sugar being too high or too low.  please keep a record of the readings and bring it to your next appointment here (or you can bring the meter itself).  You can write it on any piece of paper.  please call us sooner if your blood sugar goes below 70, or if you have a lot of readings over 200.   Please reduce the glipizide to just 5 mg breakfast, and:  Change the Novolog to 3 times a day (just before each meal) 07-05-13 units.   We will need to take this complex situation in stages.   Please come back for a follow-up appointment in 1 month.  Please see our pump trained the same day

## 2020-11-10 NOTE — Patient Instructions (Addendum)
check your blood sugar once a day.  vary the time of day when you check, between before the 3 meals, and at bedtime.  also check if you have symptoms of your blood sugar being too high or too low.  please keep a record of the readings and bring it to your next appointment here (or you can bring the meter itself).  You can write it on any piece of paper.  please call us sooner if your blood sugar goes below 70, or if you have a lot of readings over 200.  Please reduce the glipizide to just 5 mg breakfast, and:  Change the Novolog to 3 times a day (just before each meal) 07-05-13 units.   We will need to take this complex situation in stages.   Please come back for a follow-up appointment in 1 month.  Please see our pump trained the same day

## 2020-11-12 ENCOUNTER — Encounter: Payer: Self-pay | Admitting: Podiatry

## 2020-11-13 ENCOUNTER — Encounter (HOSPITAL_COMMUNITY): Payer: BC Managed Care – PPO

## 2020-11-14 ENCOUNTER — Other Ambulatory Visit: Payer: Self-pay

## 2020-11-14 ENCOUNTER — Ambulatory Visit (HOSPITAL_COMMUNITY)
Admission: RE | Admit: 2020-11-14 | Discharge: 2020-11-14 | Disposition: A | Discharge status: Home / Self Care | Payer: BC Managed Care – PPO | Source: Ambulatory Visit | Attending: Physician Assistant | Admitting: Physician Assistant

## 2020-11-14 ENCOUNTER — Ambulatory Visit (INDEPENDENT_AMBULATORY_CARE_PROVIDER_SITE_OTHER): Payer: BC Managed Care – PPO | Admitting: Physician Assistant

## 2020-11-14 ENCOUNTER — Encounter (HOSPITAL_COMMUNITY): Payer: Self-pay

## 2020-11-14 VITALS — BP 100/58 | HR 86 | Temp 97.9°F | Resp 20 | Ht 69.0 in | Wt 171.0 lb

## 2020-11-14 DIAGNOSIS — I739 Peripheral vascular disease, unspecified: Secondary | ICD-10-CM | POA: Diagnosis not present

## 2020-11-14 NOTE — Progress Notes (Signed)
Office Note     CC:  follow up Requesting Provider:  Pieter Partridge, PA  HPI: Duane Hall is a 64 y.o. (1956/03/03) male who presents for follow up of peripheral artery disease with chronic non healing heel ulceration. He is s/p Aortogram with bilateral lower extremity angiography by Dr. Donzetta Matters on 10/16/20. Dr. Donzetta Matters found inline flow via the PT to the left foot and minimal stenosis on the right lower extremity that did not require intervention. His wounds are slowly healing but per patient and wife they are doing better. He continues to go to Podiatrist monthly for wound care follow up. He reports a small new area on the right 1st toe that they think is secondary to his shoe rubbing on the toe. This appears stable. Patient's wife does daily wound care and wound checks on his feet. He has cramping in his legs and feet following dialysis but otherwise denies claudication or rest pain. He is compliant with his Aspirin, Statin and Plavix.   Past Medical History:  Diagnosis Date  . Asthma   . Cancer ([place])   . Cataract   . CKD (chronic kidney disease), stage III ([place])   . Diabetes mellitus   . Glaucoma   . Vascular insufficiency 05/2020    Past Surgical History:  Procedure Laterality Date  . ABDOMINAL AORTOGRAM W/LOWER EXTREMITY N/A 06/19/2020   Procedure: ABDOMINAL AORTOGRAM W/LOWER EXTREMITY;  Surgeon: Waynetta Sandy, MD;  Location: [facility];  Service: Cardiovascular;  Laterality: N/A;  . ABDOMINAL AORTOGRAM W/LOWER EXTREMITY Left 10/16/2020   Procedure: ABDOMINAL AORTOGRAM W/LOWER EXTREMITY;  Surgeon: Waynetta Sandy, MD;  Location: [facility];  Service: Cardiovascular;  Laterality: Left;  . AV FISTULA PLACEMENT Right 09/14/2019   Procedure: Right Arm Basilic Vein transposition;  Surgeon: Angelia Mould, MD;  Location: [facility];  Service: Vascular;  Laterality: Right;  . BONE BIOPSY Left 07/29/2020   Procedure: BONE BIOPSY;  Surgeon: [name], DPM;  Location: [facility];  Service: Podiatry;  Laterality: Left;  Need bone trephines and/or large bore Giamshidi  . COLONOSCOPY    . PERIPHERAL VASCULAR ATHERECTOMY Left 06/19/2020   Procedure: PERIPHERAL VASCULAR ATHERECTOMY;  Surgeon: Waynetta Sandy, MD;  Location: [facility];  Service: Cardiovascular;  Laterality: Left;  SFA  . UPPER GASTROINTESTINAL ENDOSCOPY  02/2019   Dr Benson [place]      Social History   Socioeconomic History  . Marital status: Married    Spouse name: Not on file  . Number of children: Not on file  . Years of education: Not on file  . Highest education level: Not on file  Occupational History  . Occupation: Chief Financial Officer  Tobacco Use  . Smoking status: Current Every Day Smoker    Packs/day: 0.30    Types: Cigarettes  . Smokeless tobacco: Never Used  Vaping Use  . Vaping Use: Never used  Substance and Sexual Activity  . Alcohol use: No    Alcohol/week: 0.0 standard drinks  . Drug use: No  . Sexual activity: Not on file  Other Topics Concern  . Not on file  Social History Narrative   Married.    Social Determinants of Health   Financial Resource Strain:   . Difficulty of Paying Living Expenses: Not on file  Food Insecurity:   . Worried About Charity fundraiser in the Last Year: Not on file  . Ran Out of Food in the Last Year: Not on file  Transportation Needs:   . Film/video editor (Medical): Not on file  . Lack of Transportation (Non-Medical): Not on file  Physical Activity:   . Days of Exercise per Week: Not on file  . Minutes of Exercise per Session: Not on file  Stress:   . Feeling of Stress : Not on file  Social Connections:   . Frequency of Communication with Friends and Family: Not on file  . Frequency of Social Gatherings with Friends and Family: Not on file  . Attends Religious Services: Not on file  . Active Member of Clubs or Organizations: Not on file  . Attends Archivist Meetings: Not on file  .  Marital Status: Not on file  Intimate Partner Violence:   . Fear of Current or Ex-Partner: Not on file  . Emotionally Abused: Not on file  . Physically Abused: Not on file  . Sexually Abused: Not on file   Family History  Problem Relation Age of Onset  . Cancer Father   . Diabetes Mother     Current Outpatient Medications  Medication Sig Dispense Refill  . aspirin EC 81 MG EC tablet Take 1 tablet (81 mg total) by mouth daily. Swallow whole. 30 tablet 11  . clopidogrel (PLAVIX) 75 MG tablet Take 1 tablet (75 mg total) by mouth daily with breakfast. 30 tablet 3  . collagenase (SANTYL) ointment Apply 1 application topically daily. Apply to the left heel daily with a layer the thickness of a nickel 30 g 2  . Continuous Blood Gluc Receiver (FREESTYLE LIBRE READER) DEVI 1 each by Does not apply route See admin instructions. WILL NOT COMPLETE PA. DOES NOT MEET CRITERIA. WILL PAY OUT OF POCKET 1 each 0  . Continuous Blood Gluc Sensor (FREESTYLE LIBRE 14 DAY SENSOR) MISC 1 each by Does not apply route every 14 (fourteen) days. WILL NOT COMPLETE PA. DOES NOT MEET CRITERIA. WILL PAY OUT OF POCKET 2 each 2  . ferric citrate (AURYXIA) 1 GM 210 MG(Fe) tablet Take 210 mg by mouth in the morning and at bedtime.     . gabapentin (NEURONTIN) 100 MG capsule Take 100 mg by mouth 3 (three) times daily.    Marland Kitchen glipiZIDE (GLUCOTROL) 5 MG tablet Take 1 tablet (5 mg total) by mouth daily before breakfast. 30 tablet 3  . GVOKE HYPOPEN 1-PACK 1 MG/0.2ML SOAJ Inject 1 mg into the skin as needed (low blood sugar).     . insulin aspart (NOVOLOG FLEXPEN) 100 UNIT/ML FlexPen 3 times a day (just before each meal) 07-05-13 units. 15 mL 2  . lidocaine-prilocaine (EMLA) cream Apply 1 application topically See admin instructions. Apply topically to port access one hour prior to dialysis on Sunday, Monday, Wednesday, Thursday    . Methoxy PEG-Epoetin Beta (MIRCERA IJ) Inject into the skin every 30 (thirty) days.     . multivitamin  (RENA-VIT) TABS tablet Take 1 tablet by mouth daily.    Marland Kitchen omeprazole (PRILOSEC) 40 MG capsule Take 40 mg by mouth daily.    . Probiotic Product (PROBIOTIC DAILY PO) Take 1 each by mouth at bedtime.     . rosuvastatin (CRESTOR) 10 MG tablet TAKE 1 TABLET BY MOUTH DAILY (Patient taking differently: Take 10 mg by mouth daily. ) 30 tablet 11  . sennosides-docusate sodium (SENOKOT-S) 8.6-50 MG tablet Take 1 tablet by mouth at bedtime.      Current Facility-Administered Medications  Medication Dose Route Frequency Provider Last Rate Last Admin  . 0.9 %  sodium chloride infusion  250 mL Intravenous PRN Waynetta Sandy, MD      . sodium chloride flush (NS) 0.9 % injection 3 mL  3 mL Intravenous Q12H Waynetta Sandy, MD      . sodium chloride flush (NS) 0.9 % injection 3 mL  3 mL Intravenous PRN Waynetta Sandy, MD        Allergies  Allergen Reactions  . Augmentin [Amoxicillin-Pot Clavulanate] Diarrhea  . Midodrine Other (See Comments)    Other reaction(s): Urinary Sensation     REVIEW OF SYSTEMS:   [X]  denotes positive finding, [ ]  denotes negative finding Cardiac  Comments:  Chest pain or chest pressure:    Shortness of breath upon exertion:    Short of breath when lying flat:    Irregular heart rhythm:        Vascular    Pain in calf, thigh, or hip brought on by ambulation:    Pain in feet at night that wakes you up from your sleep:     Blood clot in your veins:    Leg swelling:         Pulmonary    Oxygen at home:    Productive cough:     Wheezing:         Neurologic    Sudden weakness in arms or legs:     Sudden numbness in arms or legs:     Sudden onset of difficulty speaking or slurred speech:    Temporary loss of vision in one eye:     Problems with dizziness:         Gastrointestinal    Blood in stool:     Vomited blood:         Genitourinary    Burning when urinating:     Blood in urine:        Psychiatric    Major depression:          Hematologic    Bleeding problems:    Problems with blood clotting too easily:        Skin    Rashes or ulcers:        Constitutional    Fever or chills:      PHYSICAL EXAMINATION:  Vitals:   11/14/20 1502  BP: (!) 100/58  Pulse: 86  Resp: 20  Temp: 97.9 F (36.6 C)  TempSrc: Temporal  SpO2: 100%  Weight: 171 lb (77.6 kg)  Height: 5\' 9"  (1.753 m)    General:  WDWN in NAD; vital signs documented above Gait: Normal HENT: WNL, normocephalic Pulmonary: normal non-labored breathing , without Rales, rhonchi,  wheezing Cardiac: regular HR, without  Murmurs without carotid bruit Abdomen: soft, NT, no masses Vascular Exam/Pulses: right common femoral access site without swelling or hematoma. 2+ bilateral femoral pulses, Brisk doppler right DP/ PT, brisk left PT, monophasic DP. Feet warm and well perfused   . Left heel with pink granulation tissue, some surrounding callused tissue    Right heel wound with small amount of necrotic tissue and right 1st toe with some callus present. Small amount of erythema surrounding  Musculoskeletal: no muscle wasting or atrophy  Neurologic: A&O X 3;  No focal weakness or paresthesias are detected Psychiatric:  The pt has Normal affect.  Findings on Angiogram: 10/16/20 The aorta demonstrates no flow-limiting stenosis as do the iliac arteries.  The renal arteries are diminutive consistent with his dialysis requirement.  Left SFA appears patent with less than 30% stenoses  dominant runoff is via the posterior tibial artery which does fill the foot well.  On the right side there are 2 areas of approximately 50% stenosis in the mid and distal SFA these were not treated given that we had right common femoral access and the wound is healing well.   ASSESSMENT/PLAN:: 64 y.o. male here for follow up for peripheral arterial disease. He recently underwent angiography that was diagnostic by Dr. Donzetta Matters on 10/16/20. Dr. Donzetta Matters found inline flow via the PT  to the left foot and minimal stenosis on the right lower extremity. His wounds are slowly healing. He continues to go to Podiatrist monthly for wound care follow up. - he will continue his Aspirin, statin and Plavix - I have encouraged continued ambulation and exercise therapy to promote development of collaterals - He will follow up sooner if any new or worsening symptoms or concerns regarding adequate wound heeling - Follow up in 3 months with ABI in the [facility]    Paulo Fruit, PA-C [facility] [phone]  Clinic MD:  Dr. Stanford Breed

## 2020-11-15 ENCOUNTER — Other Ambulatory Visit: Payer: Self-pay

## 2020-11-15 DIAGNOSIS — L89620 Pressure ulcer of left heel, unstageable: Secondary | ICD-10-CM | POA: Diagnosis not present

## 2020-11-15 DIAGNOSIS — I739 Peripheral vascular disease, unspecified: Secondary | ICD-10-CM

## 2020-11-16 ENCOUNTER — Other Ambulatory Visit: Payer: Self-pay | Admitting: Endocrinology

## 2020-11-16 DIAGNOSIS — N186 End stage renal disease: Secondary | ICD-10-CM

## 2020-11-16 DIAGNOSIS — Z992 Dependence on renal dialysis: Secondary | ICD-10-CM

## 2020-11-16 DIAGNOSIS — E1122 Type 2 diabetes mellitus with diabetic chronic kidney disease: Secondary | ICD-10-CM

## 2020-11-20 ENCOUNTER — Encounter: Payer: Self-pay | Admitting: Endocrinology

## 2020-11-20 DIAGNOSIS — L89623 Pressure ulcer of left heel, stage 3: Secondary | ICD-10-CM | POA: Diagnosis not present

## 2020-11-20 DIAGNOSIS — L8996 Pressure-induced deep tissue damage of unspecified site: Secondary | ICD-10-CM | POA: Diagnosis not present

## 2020-11-20 DIAGNOSIS — I7300 Raynaud's syndrome without gangrene: Secondary | ICD-10-CM | POA: Diagnosis not present

## 2020-11-22 ENCOUNTER — Ambulatory Visit: Payer: BC Managed Care – PPO | Admitting: Internal Medicine

## 2020-11-28 DIAGNOSIS — N186 End stage renal disease: Secondary | ICD-10-CM | POA: Diagnosis not present

## 2020-11-28 DIAGNOSIS — Z992 Dependence on renal dialysis: Secondary | ICD-10-CM | POA: Diagnosis not present

## 2020-11-28 DIAGNOSIS — E1129 Type 2 diabetes mellitus with other diabetic kidney complication: Secondary | ICD-10-CM | POA: Diagnosis not present

## 2020-11-30 ENCOUNTER — Other Ambulatory Visit: Payer: Self-pay | Admitting: Podiatry

## 2020-11-30 MED ORDER — MUPIROCIN 2 % EX OINT: 1.0000 "application " | TOPICAL_OINTMENT | Freq: Two times a day (BID) | CUTANEOUS | 2 refills | Status: DC

## 2020-12-05 ENCOUNTER — Ambulatory Visit (INDEPENDENT_AMBULATORY_CARE_PROVIDER_SITE_OTHER): Payer: BC Managed Care – PPO | Admitting: Podiatry

## 2020-12-05 ENCOUNTER — Other Ambulatory Visit: Payer: Self-pay

## 2020-12-05 DIAGNOSIS — M5416 Radiculopathy, lumbar region: Secondary | ICD-10-CM | POA: Diagnosis not present

## 2020-12-05 DIAGNOSIS — F17200 Nicotine dependence, unspecified, uncomplicated: Secondary | ICD-10-CM

## 2020-12-05 DIAGNOSIS — I739 Peripheral vascular disease, unspecified: Secondary | ICD-10-CM

## 2020-12-05 DIAGNOSIS — L89623 Pressure ulcer of left heel, stage 3: Secondary | ICD-10-CM

## 2020-12-05 DIAGNOSIS — N186 End stage renal disease: Secondary | ICD-10-CM | POA: Diagnosis not present

## 2020-12-05 DIAGNOSIS — L89890 Pressure ulcer of other site, unstageable: Secondary | ICD-10-CM | POA: Diagnosis not present

## 2020-12-05 DIAGNOSIS — E1169 Type 2 diabetes mellitus with other specified complication: Secondary | ICD-10-CM

## 2020-12-05 DIAGNOSIS — M545 Low back pain, unspecified: Secondary | ICD-10-CM | POA: Diagnosis not present

## 2020-12-07 ENCOUNTER — Other Ambulatory Visit: Payer: Self-pay | Admitting: Endocrinology

## 2020-12-07 DIAGNOSIS — N186 End stage renal disease: Secondary | ICD-10-CM

## 2020-12-07 DIAGNOSIS — Z992 Dependence on renal dialysis: Secondary | ICD-10-CM

## 2020-12-07 DIAGNOSIS — E1122 Type 2 diabetes mellitus with diabetic chronic kidney disease: Secondary | ICD-10-CM

## 2020-12-07 DIAGNOSIS — M5136 Other intervertebral disc degeneration, lumbar region: Secondary | ICD-10-CM | POA: Diagnosis not present

## 2020-12-08 NOTE — Progress Notes (Signed)
     Subjective:  Patient ID: Duane Hall, male    DOB: July 27, 1956,  MRN: [id number]  Chief Complaint  Patient presents with  . Wound Check    Left heel and Right hallux wound check. Pt stated that they are concerned about the right toe.      64 y.o. male presents with the above complaint. History confirmed with patient.   Here with his wife today.  They are concerned about a new wound on the right hallux   Objective:  Physical Exam:   Minimal edema and erythema bilaterally.  Nearly full granular base of the left heel, surrounding hyperkeratosis and nonviable tissue.  Post debridement dimensions here are 4.5 cm x 4.0 cm x 0.2 cm.  Right posterior heel ulcer has fully healed, scab remains.  No signs of infection of either wound..  Has a new right hallux eschar measuring 2.5 cm x 1.5 cm.  Right foot is cooler to touch than the left foot.  His pulses are nonpalpable here                Assessment:   1. Pressure injury of toe of right foot, unstageable ([place])   2. Pressure injury of left heel, stage 3 ([place])   3. PAD (peripheral artery disease) ([place])   4. Current smoker   5. ESRD (end stage renal disease) ([place])   6. Type 2 diabetes mellitus with other specified complication, without long-term current use of insulin ([place])      Plan:  Patient was evaluated and treated and all questions answered.  He has a new ulceration on the right hallux likely pressure injury in the setting of his peripheral arterial disease.  Concerned this is going to have quite difficult time healing.  He remains at high risk of infection.  We discussed the risks of this.  They should apply Santyl ointment to allow the eschar to begin to be debrided enzymatically.  I will discuss with Dr. Donzetta Matters given his history of stenosis on the right side.  Dispense surgical shoe to offload the area  -Debridement of left heel margins performed below -Right side remains healed - Continue offloading while in  bed -Continue local wound care at home with Santyl and wet-to-dry dressings daily or every other day. A -May bear full weight on the right foot, toe-touch weightbearing on the left foot   Procedure: Excisional Debridement of left heel wound Rationale: Removal of non-viable soft tissue from the wound to promote healing.  Anesthesia: none Pre-Debridement Wound Measurements: 4.5 x 4.0 x 0.2 cm Post-Debridement Wound Measurements: Same as predebridement Type of Debridement: Sharp selective Tissue Removed: Non-viable soft tissue Depth of Debridement: subcutaneous tissue. Technique: Sharp selective debridement to bleeding, viable wound base.  Dressing: Dry, sterile, compression dressing. Disposition: Patient tolerated procedure well.

## 2020-12-12 ENCOUNTER — Ambulatory Visit (INDEPENDENT_AMBULATORY_CARE_PROVIDER_SITE_OTHER): Payer: BC Managed Care – PPO | Admitting: Endocrinology

## 2020-12-12 ENCOUNTER — Encounter: Payer: Self-pay | Admitting: Endocrinology

## 2020-12-12 ENCOUNTER — Other Ambulatory Visit: Payer: Self-pay

## 2020-12-12 ENCOUNTER — Encounter: Payer: BC Managed Care – PPO | Attending: Endocrinology | Admitting: Nutrition

## 2020-12-12 DIAGNOSIS — N186 End stage renal disease: Secondary | ICD-10-CM | POA: Diagnosis not present

## 2020-12-12 DIAGNOSIS — E1165 Type 2 diabetes mellitus with hyperglycemia: Secondary | ICD-10-CM | POA: Diagnosis not present

## 2020-12-12 DIAGNOSIS — Z992 Dependence on renal dialysis: Secondary | ICD-10-CM | POA: Diagnosis not present

## 2020-12-12 DIAGNOSIS — E1122 Type 2 diabetes mellitus with diabetic chronic kidney disease: Secondary | ICD-10-CM | POA: Diagnosis not present

## 2020-12-12 MED ORDER — SODIUM CHLORIDE 0.9% FLUSH: 3.0000 mL | Freq: Two times a day (BID) | INTRAVENOUS | Status: DC

## 2020-12-12 MED ORDER — NOVOLIN N FLEXPEN 100 UNIT/ML ~~LOC~~ SUPN: 2.0000 [IU] | PEN_INJECTOR | Freq: Every day | SUBCUTANEOUS | 11 refills | Status: DC

## 2020-12-12 MED ORDER — FREESTYLE LIBRE 14 DAY SENSOR MISC: 1.0000 | 0 refills | Status: DC

## 2020-12-12 MED ORDER — SODIUM CHLORIDE 0.9 % IV SOLN: 250.0000 mL | INTRAVENOUS | Status: DC | PRN

## 2020-12-12 MED ORDER — SODIUM CHLORIDE 0.9% FLUSH: 3.0000 mL | INTRAVENOUS | Status: DC | PRN

## 2020-12-12 MED ORDER — PEN NEEDLES 31G X 6 MM MISC: 1.0000 | Freq: Four times a day (QID) | 3 refills | Status: DC

## 2020-12-12 NOTE — Progress Notes (Signed)
Subjective:    Patient ID: Duane Hall, male    DOB: Dec 23, 1956, 64 y.o.   MRN: [id number]  HPI Pt returns for f/u of diabetes mellitus: DM type: Insulin-requiring type 2, but he is at risk for evolving type 1.  Dx'ed: 4098 Complications: ESRD, foot ulcers, GP, PAD, and PDR.   Therapy: insulin since 2021 DKA: never Severe hypoglycemia: never.  Pancreatitis: never Pancreatic imaging:  SDOH: Wife provides most hx, due to pt's poor overall health; he eats meals at 10AM, (sometimes 2PM), and 6PM; He eats small breakfast and lunch, and a larger dinner.  Other: fructosamine converts to higher avg glucose than A1c, prob due to renal failure; he has FL continuous glucose monitor; he stopped Trulicity, due to GP;   Interval history: I reviewed continuous glucose monitor data.  Glucose varies from 50-360.  It is in general highest at [place], and lowest at [place].  It increases overnight.  He takes glipizide 10 mg QAM.  He has hypoglycemia approx twice per week.  Past Medical History:  Diagnosis Date  . Asthma   . Cancer ([place])   . Cataract   . CKD (chronic kidney disease), stage III ([place])   . Diabetes mellitus   . Glaucoma   . Vascular insufficiency 05/2020    Past Surgical History:  Procedure Laterality Date  . ABDOMINAL AORTOGRAM W/LOWER EXTREMITY N/A 06/19/2020   Procedure: ABDOMINAL AORTOGRAM W/LOWER EXTREMITY;  Surgeon: Waynetta Sandy, MD;  Location: [facility];  Service: Cardiovascular;  Laterality: N/A;  . ABDOMINAL AORTOGRAM W/LOWER EXTREMITY Left 10/16/2020   Procedure: ABDOMINAL AORTOGRAM W/LOWER EXTREMITY;  Surgeon: Waynetta Sandy, MD;  Location: [facility];  Service: Cardiovascular;  Laterality: Left;  . AV FISTULA PLACEMENT Right 09/14/2019   Procedure: Right Arm Basilic Vein transposition;  Surgeon: Angelia Mould, MD;  Location: [facility];  Service: Vascular;  Laterality: Right;  . BONE BIOPSY Left 07/29/2020   Procedure: BONE BIOPSY;   Surgeon: [name], DPM;  Location: [facility];  Service: Podiatry;  Laterality: Left;  Need bone trephines and/or large bore Giamshidi  . COLONOSCOPY    . PERIPHERAL VASCULAR ATHERECTOMY Left 06/19/2020   Procedure: PERIPHERAL VASCULAR ATHERECTOMY;  Surgeon: Waynetta Sandy, MD;  Location: [facility];  Service: Cardiovascular;  Laterality: Left;  SFA  . UPPER GASTROINTESTINAL ENDOSCOPY  02/2019   Dr Benson [place]      Social History   Socioeconomic History  . Marital status: Married    Spouse name: Not on file  . Number of children: Not on file  . Years of education: Not on file  . Highest education level: Not on file  Occupational History  . Occupation: Chief Financial Officer  Tobacco Use  . Smoking status: Current Every Day Smoker    Packs/day: 0.30    Types: Cigarettes  . Smokeless tobacco: Never Used  Vaping Use  . Vaping Use: Never used  Substance and Sexual Activity  . Alcohol use: No    Alcohol/week: 0.0 standard drinks  . Drug use: No  . Sexual activity: Not on file  Other Topics Concern  . Not on file  Social History Narrative   Married.    Social Determinants of Health   Financial Resource Strain: Not on file  Food Insecurity: Not on file  Transportation Needs: Not on file  Physical Activity: Not on file  Stress: Not on file  Social Connections: Not on file  Intimate Partner Violence: Not on file  Current Outpatient Medications on File Prior to Visit  Medication Sig Dispense Refill  . aspirin EC 81 MG EC tablet Take 1 tablet (81 mg total) by mouth daily. Swallow whole. 30 tablet 11  . clopidogrel (PLAVIX) 75 MG tablet Take 1 tablet (75 mg total) by mouth daily with breakfast. 30 tablet 3  . collagenase (SANTYL) ointment Apply 1 application topically daily. Apply to the left heel daily with a layer the thickness of a nickel 30 g 2  . Continuous Blood Gluc Receiver (FREESTYLE LIBRE READER) DEVI 1 each by Does not apply route See admin instructions. WILL NOT  COMPLETE PA. DOES NOT MEET CRITERIA. WILL PAY OUT OF POCKET 1 each 0  . ferric citrate (AURYXIA) 1 GM 210 MG(Fe) tablet Take 210 mg by mouth in the morning and at bedtime.     . gabapentin (NEURONTIN) 100 MG capsule Take 100 mg by mouth 3 (three) times daily.    Marland Kitchen GVOKE HYPOPEN 1-PACK 1 MG/0.2ML SOAJ Inject 1 mg into the skin as needed (low blood sugar).     . insulin aspart (NOVOLOG FLEXPEN) 100 UNIT/ML FlexPen 3 times a day (just before each meal) 07-05-13 units. 15 mL 2  . lidocaine-prilocaine (EMLA) cream Apply 1 application topically See admin instructions. Apply topically to port access one hour prior to dialysis on Sunday, Monday, Wednesday, Thursday    . Methoxy PEG-Epoetin Beta (MIRCERA IJ) Inject into the skin every 30 (thirty) days.     . multivitamin (RENA-VIT) TABS tablet Take 1 tablet by mouth daily.    . mupirocin ointment (BACTROBAN) 2 % Apply 1 application topically 2 (two) times daily. 15 g 2  . omeprazole (PRILOSEC) 40 MG capsule Take 40 mg by mouth daily.    . Probiotic Product (PROBIOTIC DAILY PO) Take 1 each by mouth at bedtime.     . rosuvastatin (CRESTOR) 10 MG tablet TAKE 1 TABLET BY MOUTH DAILY (Patient taking differently: Take 10 mg by mouth daily.) 30 tablet 11  . sennosides-docusate sodium (SENOKOT-S) 8.6-50 MG tablet Take 1 tablet by mouth at bedtime.     Current Facility-Administered Medications on File Prior to Visit  Medication Dose Route Frequency Provider Last Rate Last Admin  . 0.9 %  sodium chloride infusion  250 mL Intravenous PRN Waynetta Sandy, MD      . sodium chloride flush (NS) 0.9 % injection 3 mL  3 mL Intravenous Q12H Waynetta Sandy, MD      . sodium chloride flush (NS) 0.9 % injection 3 mL  3 mL Intravenous PRN Waynetta Sandy, MD        Allergies  Allergen Reactions  . Augmentin [Amoxicillin-Pot Clavulanate] Diarrhea  . Midodrine Other (See Comments)    Other reaction(s): Urinary Sensation    Family History   Problem Relation Age of Onset  . Cancer Father   . Diabetes Mother     BP 114/60   Pulse 60   Ht 5\' 9"  (1.753 m)   Wt 175 lb (79.4 kg)   SpO2 98%   BMI 25.84 kg/m     Review of Systems     Objective:   Physical Exam       Assessment & Plan:  Insulin-requiring type 2 DM, with ESRD: uncontrolled Hypoglycemia, due to insulin: this limits aggressiveness of glycemic control.  Patient Instructions  check your blood sugar 4 times a day: before the 3 meals, and at bedtime.  also check if you have symptoms of your blood  sugar being too high or too low.  please keep a record of the readings and bring it to your next appointment here (or you can bring the meter itself).  You can write it on any piece of paper.  please call us sooner if your blood sugar goes below 70, or if you have a lot of readings over 200.  Please stop taking the glipizide, and:  Please continue the same Novolog, and:   I have sent a prescription to your pharmacy, to add NPH insulin, 2 units at bedtime.  We will need to take this complex situation in stages.   Please come back for a follow-up appointment in 6 weeks.

## 2020-12-12 NOTE — Patient Instructions (Addendum)
check your blood sugar 4 times a day: before the 3 meals, and at bedtime.  also check if you have symptoms of your blood sugar being too high or too low.  please keep a record of the readings and bring it to your next appointment here (or you can bring the meter itself).  You can write it on any piece of paper.  please call us sooner if your blood sugar goes below 70, or if you have a lot of readings over 200.  Please stop taking the glipizide, and:  Please continue the same Novolog, and:   I have sent a prescription to your pharmacy, to add NPH insulin, 2 units at bedtime.  We will need to take this complex situation in stages.   Please come back for a follow-up appointment in 6 weeks.

## 2020-12-13 NOTE — Patient Instructions (Signed)
Please read over information given and go on line, or call companies for further information. Call when pump comes in to schedule appointment for training.

## 2020-12-13 NOTE — Progress Notes (Signed)
Patient is here today with his wife to discuss insulin pumps.  They have reviewed some of them on line.  He is currently using a Libre CGM.  They were shown all of the pumps, and we discussed advantages and disadvantages of each model.  He chose the [facility] system.  Paperwork for this pump as well as the Dexcom was filled out and faxed to respective places to determine cost before any decision is made.  Brochure given with information on the Dash system and Dexcom for review.  They had no final questions.

## 2020-12-19 ENCOUNTER — Ambulatory Visit (INDEPENDENT_AMBULATORY_CARE_PROVIDER_SITE_OTHER): Payer: BC Managed Care – PPO | Admitting: Podiatry

## 2020-12-19 ENCOUNTER — Other Ambulatory Visit: Payer: Self-pay

## 2020-12-19 DIAGNOSIS — L89623 Pressure ulcer of left heel, stage 3: Secondary | ICD-10-CM | POA: Diagnosis not present

## 2020-12-19 DIAGNOSIS — I739 Peripheral vascular disease, unspecified: Secondary | ICD-10-CM | POA: Diagnosis not present

## 2020-12-19 DIAGNOSIS — F17200 Nicotine dependence, unspecified, uncomplicated: Secondary | ICD-10-CM | POA: Diagnosis not present

## 2020-12-19 DIAGNOSIS — L89890 Pressure ulcer of other site, unstageable: Secondary | ICD-10-CM

## 2020-12-19 DIAGNOSIS — N186 End stage renal disease: Secondary | ICD-10-CM | POA: Diagnosis not present

## 2020-12-19 DIAGNOSIS — E1169 Type 2 diabetes mellitus with other specified complication: Secondary | ICD-10-CM

## 2020-12-20 DIAGNOSIS — L89623 Pressure ulcer of left heel, stage 3: Secondary | ICD-10-CM | POA: Diagnosis not present

## 2020-12-20 DIAGNOSIS — L8996 Pressure-induced deep tissue damage of unspecified site: Secondary | ICD-10-CM | POA: Diagnosis not present

## 2020-12-20 DIAGNOSIS — I7300 Raynaud's syndrome without gangrene: Secondary | ICD-10-CM | POA: Diagnosis not present

## 2020-12-21 ENCOUNTER — Other Ambulatory Visit: Payer: Self-pay | Admitting: Internal Medicine

## 2020-12-21 ENCOUNTER — Other Ambulatory Visit: Payer: Self-pay | Admitting: Podiatry

## 2020-12-21 DIAGNOSIS — M86172 Other acute osteomyelitis, left ankle and foot: Secondary | ICD-10-CM

## 2020-12-22 NOTE — Progress Notes (Signed)
     Subjective:  Patient ID: Duane Hall, male    DOB: Jun 04, 1956,  MRN: [id number]  Chief Complaint  Patient presents with  . Wound Check    F/U Lt bottom heel -pt states," much better." - no redness/swelling/drianage/odor Tx: santly   F/U Rt hallux -pt states," not imporvine -w/ increase of redness, same in size and with min. Bloody draiange - no pain -w/ occaional odor per wife Tx: santly   . Nail Problem    Lt hallux discoloration x last week no injury   . Diabetes    FBS: 265 A1C: na     64 y.o. male returns with the above complaint. History confirmed with patient.   Here with his wife today.  Hallux continues to worsen on the right side.  He has an angiogram scheduled with Dr. Donzetta Matters on Monday   Objective:  Physical Exam:   Minimal edema and erythema bilaterally.  Full granular base of the left heel, surrounding hyperkeratosis and nonviable tissue.  Post debridement dimensions here are 3.5 cm x 3.0 cm x 0.2 cm.  Right posterior heel ulcer has fully healed.  No signs of infection of either wound.. Right hallux eschar measuring 3.5 cm x 1.5 cm.  Right foot is cooler to touch than the left foot.  His pulses are nonpalpable here                    Assessment:   No diagnosis found.   Plan:  Patient was evaluated and treated and all questions answered.  Angiography is scheduled for this coming Monday.  I would like to see him back the week after to reevaluate the right hallux.  Discussed with him that the best case scenario is that we can get the hallux to heal with local wound care following revascularization if possible.  Worse case scenario on the side would be that there is not good perfusion at the level of the digits and he requires transmetatarsal amputation or higher amputation.  He remains at high risk of limb loss  -Debridement of left heel margins performed below.  No debridement on the side of the hallux -Right side remains healed - Continue  offloading while in bed -Continue local wound care at home with Santyl and wet-to-dry dressings daily or every other day. A -May bear full weight on the right foot, toe-touch weightbearing on the left foot   Procedure: Excisional Debridement of left heel wound Rationale: Removal of non-viable soft tissue from the wound to promote healing.  Anesthesia: none Pre-Debridement Wound Measurements: 3.5 x 3.0 x 0.2 cm Post-Debridement Wound Measurements: Same as predebridement Type of Debridement: Sharp selective Tissue Removed: Non-viable soft tissue Depth of Debridement: subcutaneous tissue. Technique: Sharp selective debridement to bleeding, viable wound base.  Dressing: Dry, sterile, compression dressing. Disposition: Patient tolerated procedure well.

## 2020-12-25 ENCOUNTER — Ambulatory Visit (HOSPITAL_COMMUNITY)
Admission: RE | Admit: 2020-12-25 | Discharge: 2020-12-25 | Disposition: A | Discharge status: Home / Self Care | Payer: BC Managed Care – PPO | Attending: Vascular Surgery | Admitting: Vascular Surgery

## 2020-12-25 ENCOUNTER — Other Ambulatory Visit: Payer: Self-pay

## 2020-12-25 ENCOUNTER — Encounter (HOSPITAL_COMMUNITY): Payer: Self-pay | Admitting: Vascular Surgery

## 2020-12-25 ENCOUNTER — Encounter (HOSPITAL_COMMUNITY)
Admission: RE | Disposition: A | Discharge status: Home / Self Care | Payer: Self-pay | Source: Home / Self Care | Attending: Vascular Surgery

## 2020-12-25 DIAGNOSIS — Z881 Allergy status to other antibiotic agents status: Secondary | ICD-10-CM | POA: Diagnosis not present

## 2020-12-25 DIAGNOSIS — Z20822 Contact with and (suspected) exposure to covid-19: Secondary | ICD-10-CM | POA: Diagnosis not present

## 2020-12-25 DIAGNOSIS — F17210 Nicotine dependence, cigarettes, uncomplicated: Secondary | ICD-10-CM | POA: Insufficient documentation

## 2020-12-25 DIAGNOSIS — Z992 Dependence on renal dialysis: Secondary | ICD-10-CM | POA: Diagnosis not present

## 2020-12-25 DIAGNOSIS — E11621 Type 2 diabetes mellitus with foot ulcer: Secondary | ICD-10-CM | POA: Diagnosis not present

## 2020-12-25 DIAGNOSIS — Z833 Family history of diabetes mellitus: Secondary | ICD-10-CM | POA: Insufficient documentation

## 2020-12-25 DIAGNOSIS — N183 Chronic kidney disease, stage 3 unspecified: Secondary | ICD-10-CM | POA: Insufficient documentation

## 2020-12-25 DIAGNOSIS — E1122 Type 2 diabetes mellitus with diabetic chronic kidney disease: Secondary | ICD-10-CM | POA: Diagnosis not present

## 2020-12-25 DIAGNOSIS — I70235 Atherosclerosis of native arteries of right leg with ulceration of other part of foot: Secondary | ICD-10-CM | POA: Diagnosis not present

## 2020-12-25 DIAGNOSIS — Z888 Allergy status to other drugs, medicaments and biological substances status: Secondary | ICD-10-CM | POA: Diagnosis not present

## 2020-12-25 DIAGNOSIS — L97519 Non-pressure chronic ulcer of other part of right foot with unspecified severity: Secondary | ICD-10-CM | POA: Diagnosis not present

## 2020-12-25 HISTORY — PX: PERIPHERAL VASCULAR ATHERECTOMY: CATH118256

## 2020-12-25 HISTORY — PX: PERIPHERAL VASCULAR BALLOON ANGIOPLASTY: CATH118281

## 2020-12-25 HISTORY — PX: ABDOMINAL AORTOGRAM W/LOWER EXTREMITY: CATH118223

## 2020-12-25 LAB — POCT I-STAT, CHEM 8
BUN: 37 mg/dL — ABNORMAL HIGH (ref 8–23)
Calcium, Ion: 1.05 mmol/L — ABNORMAL LOW (ref 1.15–1.40)
Chloride: 96 mmol/L — ABNORMAL LOW (ref 98–111)
Creatinine, Ser: 6.3 mg/dL — ABNORMAL HIGH (ref 0.61–1.24)
Glucose, Bld: 339 mg/dL — ABNORMAL HIGH (ref 70–99)
HCT: 31 % — ABNORMAL LOW (ref 39.0–52.0)
Hemoglobin: 10.5 g/dL — ABNORMAL LOW (ref 13.0–17.0)
Potassium: 3.2 mmol/L — ABNORMAL LOW (ref 3.5–5.1)
Sodium: 135 mmol/L (ref 135–145)
TCO2: 27 mmol/L (ref 22–32)

## 2020-12-25 LAB — GLUCOSE, CAPILLARY: Glucose-Capillary: 202 mg/dL — ABNORMAL HIGH (ref 70–99)

## 2020-12-25 LAB — SARS CORONAVIRUS 2 BY RT PCR (HOSPITAL ORDER, PERFORMED IN ~~LOC~~ HOSPITAL LAB): SARS Coronavirus 2: NEGATIVE

## 2020-12-25 SURGERY — ABDOMINAL AORTOGRAM W/LOWER EXTREMITY
Anesthesia: LOCAL

## 2020-12-25 MED ORDER — LABETALOL HCL 5 MG/ML IV SOLN: 10.0000 mg | INTRAVENOUS | Status: DC | PRN

## 2020-12-25 MED ORDER — HEPARIN (PORCINE) IN NACL 1000-0.9 UT/500ML-% IV SOLN
INTRAVENOUS | Status: AC
  Filled 2020-12-25: qty 500

## 2020-12-25 MED ORDER — ACETAMINOPHEN 325 MG PO TABS: 650.0000 mg | ORAL_TABLET | ORAL | Status: DC | PRN

## 2020-12-25 MED ORDER — SODIUM CHLORIDE 0.9 % IV SOLN
INTRAVENOUS | Status: DC | PRN
  Administered 2020-12-25: 10 mL/h via INTRAVENOUS

## 2020-12-25 MED ORDER — LIDOCAINE HCL (PF) 1 % IJ SOLN
INTRAMUSCULAR | Status: AC
  Filled 2020-12-25: qty 30

## 2020-12-25 MED ORDER — MIDAZOLAM HCL 2 MG/2ML IJ SOLN
INTRAMUSCULAR | Status: DC | PRN
  Administered 2020-12-25: 2 mg via INTRAVENOUS

## 2020-12-25 MED ORDER — ONDANSETRON HCL 4 MG/2ML IJ SOLN: 4.0000 mg | Freq: Four times a day (QID) | INTRAMUSCULAR | Status: DC | PRN

## 2020-12-25 MED ORDER — HEPARIN (PORCINE) IN NACL 1000-0.9 UT/500ML-% IV SOLN
INTRAVENOUS | Status: DC | PRN
  Administered 2020-12-25: 500 mL

## 2020-12-25 MED ORDER — FENTANYL CITRATE (PF) 100 MCG/2ML IJ SOLN
INTRAMUSCULAR | Status: DC | PRN
  Administered 2020-12-25: 50 ug via INTRAVENOUS

## 2020-12-25 MED ORDER — HYDRALAZINE HCL 20 MG/ML IJ SOLN: 5.0000 mg | INTRAMUSCULAR | Status: DC | PRN

## 2020-12-25 MED ORDER — HEPARIN SODIUM (PORCINE) 1000 UNIT/ML IJ SOLN
INTRAMUSCULAR | Status: AC
  Filled 2020-12-25: qty 1

## 2020-12-25 MED ORDER — LIDOCAINE HCL (PF) 1 % IJ SOLN
INTRAMUSCULAR | Status: DC | PRN
  Administered 2020-12-25: 15 mL

## 2020-12-25 MED ORDER — IODIXANOL 320 MG/ML IV SOLN
INTRAVENOUS | Status: DC | PRN
  Administered 2020-12-25: 160 mL

## 2020-12-25 MED ORDER — SODIUM CHLORIDE 0.9% FLUSH: 3.0000 mL | Freq: Two times a day (BID) | INTRAVENOUS | Status: DC

## 2020-12-25 MED ORDER — FENTANYL CITRATE (PF) 100 MCG/2ML IJ SOLN
INTRAMUSCULAR | Status: AC
  Filled 2020-12-25: qty 2

## 2020-12-25 MED ORDER — MORPHINE SULFATE (PF) 2 MG/ML IV SOLN: 2.0000 mg | INTRAVENOUS | Status: DC | PRN

## 2020-12-25 MED ORDER — SODIUM CHLORIDE 0.9 % IV SOLN: 250.0000 mL | INTRAVENOUS | Status: DC | PRN

## 2020-12-25 MED ORDER — MIDAZOLAM HCL 2 MG/2ML IJ SOLN
INTRAMUSCULAR | Status: AC
  Filled 2020-12-25: qty 2

## 2020-12-25 MED ORDER — OXYCODONE HCL 5 MG PO TABS
5.0000 mg | ORAL_TABLET | ORAL | Status: DC | PRN
Start: 2020-12-25 — End: 2020-12-25

## 2020-12-25 MED ORDER — HEPARIN SODIUM (PORCINE) 1000 UNIT/ML IJ SOLN
INTRAMUSCULAR | Status: DC | PRN
  Administered 2020-12-25: 8000 [IU] via INTRAVENOUS

## 2020-12-25 MED ORDER — SODIUM CHLORIDE 0.9% FLUSH
3.0000 mL | INTRAVENOUS | Status: DC | PRN
Start: 2020-12-25 — End: 2020-12-25

## 2020-12-25 SURGICAL SUPPLY — 26 items
BAG SNAP BAND KOVER 36X36 (MISCELLANEOUS) ×3 IMPLANT
BALLN STERLING OTW 3X40X150 (BALLOONS) ×3
BALLN STERLING OTW 5X40X135 (BALLOONS) ×3
BALLOON STERLING OTW 3X40X150 (BALLOONS) ×2 IMPLANT
BALLOON STERLING OTW 5X40X135 (BALLOONS) ×2 IMPLANT
CATH AURYON 5FR ATHEREC 1.5 (CATHETERS) ×3 IMPLANT
CATH OMNI FLUSH 5F 65CM (CATHETERS) ×3 IMPLANT
CATH QUICKCROSS .018X135CM (MICROCATHETER) ×3 IMPLANT
CLOSURE MYNX CONTROL 6F/7F (Vascular Products) ×3 IMPLANT
COVER DOME SNAP 22 D (MISCELLANEOUS) ×3 IMPLANT
DCB RANGER 5.0X40 135 (BALLOONS) ×2 IMPLANT
GLIDEWIRE ADV .035X260CM (WIRE) ×3 IMPLANT
KIT ENCORE 26 ADVANTAGE (KITS) ×3 IMPLANT
KIT MICROPUNCTURE NIT STIFF (SHEATH) ×3 IMPLANT
KIT PV (KITS) ×3 IMPLANT
RANGER DCB 5.0X40 135 (BALLOONS) ×3
SHEATH HYDRO PINNACLE 6FR 45 (SHEATH) ×3 IMPLANT
SHEATH PINNACLE 5F 10CM (SHEATH) ×3 IMPLANT
SHEATH PINNACLE 6F 10CM (SHEATH) ×3 IMPLANT
SHEATH PROBE COVER 6X72 (BAG) ×3 IMPLANT
SYR MEDRAD MARK V 150ML (SYRINGE) ×3 IMPLANT
TRANSDUCER W/STOPCOCK (MISCELLANEOUS) ×3 IMPLANT
TRAY PV CATH (CUSTOM PROCEDURE TRAY) ×3 IMPLANT
WIRE BENTSON .035X145CM (WIRE) ×3 IMPLANT
WIRE G V18X300CM (WIRE) ×3 IMPLANT
WIRE SPARTACORE .014X300CM (WIRE) ×3 IMPLANT

## 2020-12-25 NOTE — H&P (Signed)
H+P   History of Present Illness: This is a 64 y.o. male history of bilateral heel ulceration has undergone left SFA drug-coated balloon angioplasty after laser arthrectomy.  He also had right lower extremity angiography which demonstrated SFA disease but at the time he had a healing heel ulcer which is now well-healed.  He does have now new toe ulceration.  He remains on dialysis at home.  He does walk he is touchdown weightbearing on the left only given persistent heel ulceration there but is healing well.  He is now indicated for angiography.  Past Medical History:  Diagnosis Date  . Asthma   . Cancer ([place])   . Cataract   . CKD (chronic kidney disease), stage III ([place])   . Diabetes mellitus   . Glaucoma   . Vascular insufficiency 05/2020    Past Surgical History:  Procedure Laterality Date  . ABDOMINAL AORTOGRAM W/LOWER EXTREMITY N/A 06/19/2020   Procedure: ABDOMINAL AORTOGRAM W/LOWER EXTREMITY;  Surgeon: Waynetta Sandy, MD;  Location: [facility];  Service: Cardiovascular;  Laterality: N/A;  . ABDOMINAL AORTOGRAM W/LOWER EXTREMITY Left 10/16/2020   Procedure: ABDOMINAL AORTOGRAM W/LOWER EXTREMITY;  Surgeon: Waynetta Sandy, MD;  Location: [facility];  Service: Cardiovascular;  Laterality: Left;  . AV FISTULA PLACEMENT Right 09/14/2019   Procedure: Right Arm Basilic Vein transposition;  Surgeon: Angelia Mould, MD;  Location: [facility];  Service: Vascular;  Laterality: Right;  . BONE BIOPSY Left 07/29/2020   Procedure: BONE BIOPSY;  Surgeon: [name], DPM;  Location: [facility];  Service: Podiatry;  Laterality: Left;  Need bone trephines and/or large bore Giamshidi  . COLONOSCOPY    . PERIPHERAL VASCULAR ATHERECTOMY Left 06/19/2020   Procedure: PERIPHERAL VASCULAR ATHERECTOMY;  Surgeon: Waynetta Sandy, MD;  Location: [facility];  Service: Cardiovascular;  Laterality: Left;  SFA  . UPPER GASTROINTESTINAL ENDOSCOPY  02/2019    Dr Benson Norway      Allergies  Allergen Reactions  . Augmentin [Amoxicillin-Pot Clavulanate] Diarrhea  . Midodrine Other (See Comments)    Other reaction(s): Urinary Sensation    Prior to Admission medications   Medication Sig Start Date End Date Taking? Authorizing Provider  aspirin EC 81 MG EC tablet Take 1 tablet (81 mg total) by mouth daily. Swallow whole. 06/21/20  Yes Charlynne Cousins, MD  clopidogrel (PLAVIX) 75 MG tablet Take 1 tablet (75 mg total) by mouth daily with breakfast. 06/21/20  Yes Charlynne Cousins, MD  collagenase (SANTYL) ointment Apply 1 application topically daily. Apply to the left heel daily with a layer the thickness of a nickel Patient taking differently: Apply 1 application topically See admin instructions. Apply to the left heel daily with a layer the thickness of a nickel 06/29/20  Yes McDonald, Adam R, DPM  gabapentin (NEURONTIN) 100 MG capsule Take 100 mg by mouth 3 (three) times daily. 10/21/20  Yes [provider]  insulin aspart (NOVOLOG FLEXPEN) 100 UNIT/ML FlexPen 3 times a day (just before each meal) 07-05-13 units. Patient taking differently: Inject 7-14 Units into the skin See admin instructions. 3 times a day (just before each meal) 7-units in the morning, 9-units at lunch and 14 unit at dinner 11/10/20  Yes Renato Shin, MD  Insulin NPH, Human,, Isophane, (NOVOLIN N FLEXPEN) 100 UNIT/ML Kiwkpen Inject 2 Units into the skin at bedtime. 12/12/20  Yes Renato Shin, MD  lidocaine-prilocaine (EMLA) cream Apply 1 application topically See admin instructions. Apply topically to port access  one hour prior to dialysis on Sunday, Monday, Wednesday, Thursday 05/12/20  Yes [provider]  Methoxy PEG-Epoetin Beta (MIRCERA IJ) Inject into the skin every 30 (thirty) days. 07/03/20  Yes [provider]  multivitamin (RENA-VIT) TABS tablet Take 1 tablet by mouth daily.   Yes [provider]  omeprazole (PRILOSEC) 40 MG capsule Take 40 mg  by mouth daily.   Yes [provider]  rosuvastatin (CRESTOR) 10 MG tablet TAKE 1 TABLET BY MOUTH DAILY Patient taking differently: Take 10 mg by mouth daily. 08/17/20  Yes Waynetta Sandy, MD  sennosides-docusate sodium (SENOKOT-S) 8.6-50 MG tablet Take 1 tablet by mouth at bedtime.   Yes [provider]  Continuous Blood Gluc Sensor (FREESTYLE LIBRE 14 DAY SENSOR) MISC 1 Device by Other route every 14 (fourteen) days. 12/12/20   Renato Shin, MD  ferric citrate (AURYXIA) 1 GM 210 MG(Fe) tablet Take 210 mg by mouth in the morning and at bedtime.     [provider]  GVOKE HYPOPEN 1-PACK 1 MG/0.2ML SOAJ Inject 1 mg into the skin daily as needed (low blood sugar). 09/20/20   [provider]  Insulin Pen Needle (PEN NEEDLES) 31G X 6 MM MISC 1 Device by Does not apply route in the morning, at noon, in the evening, and at bedtime. 12/12/20   Renato Shin, MD  mupirocin ointment (BACTROBAN) 2 % Apply 1 application topically 2 (two) times daily. 11/30/20   [name], DPM    Social History   Socioeconomic History  . Marital status: Married    Spouse name: Not on file  . Number of children: Not on file  . Years of education: Not on file  . Highest education level: Not on file  Occupational History  . Occupation: Chief Financial Officer  Tobacco Use  . Smoking status: Current Every Day Smoker    Packs/day: 0.30    Types: Cigarettes  . Smokeless tobacco: Never Used  Vaping Use  . Vaping Use: Never used  Substance and Sexual Activity  . Alcohol use: No    Alcohol/week: 0.0 standard drinks  . Drug use: No  . Sexual activity: Not on file  Other Topics Concern  . Not on file  Social History Narrative   Married.    Social Determinants of Health   Financial Resource Strain: Not on file  Food Insecurity: Not on file  Transportation Needs: Not on file  Physical Activity: Not on file  Stress: Not on file  Social Connections: Not on file  Intimate  Partner Violence: Not on file    Family History  Problem Relation Age of Onset  . Cancer Father   . Diabetes Mother     ROS:  Cardiovascular: []  chest pain/pressure []  palpitations []  SOB lying flat []  DOE []  pain in legs while walking []  pain in legs at rest []  pain in legs at night []  non-healing ulcers []  hx of DVT []  swelling in legs  Pulmonary: []  productive cough []  asthma/wheezing []  home O2  Neurologic: []  weakness in []  arms []  legs []  numbness in []  arms []  legs []  hx of CVA []  mini stroke [] difficulty speaking or slurred speech []  temporary loss of vision in one eye []  dizziness  Hematologic: []  hx of cancer []  bleeding problems []  problems with blood clotting easily  Endocrine:   []  diabetes []  thyroid disease  GI []  vomiting blood []  blood in stool  GU: [x]  CKD/renal failure []  HD--[]  M/W/F or []  T/T/S []   burning with urination []  blood in urine  Psychiatric: []  anxiety []  depression  Musculoskeletal: []  arthritis []  joint pain  Integumentary: []  rashes [x]  ulcers  Constitutional: []  fever []  chills   Physical Examination  Vitals:   12/25/20 0607  BP: 121/64  Pulse: 89  Temp: 98.7 F (37.1 C)  SpO2: 100%   Body mass index is 25.23 kg/m.  General:  nad HENT: WNL, normocephalic Pulmonary: normal non-labored breathing Cardiac: bilateral femoral pulses are palpable Abdomen:  soft, NT/ND, no masses Extremities: Right great toe ulceration.  Right heel is well-healed, left heel is ulcerated Neurologic: A&O X 3; Appropriate Affect ; SENSATION: normal; MOTOR FUNCTION:  moving all extremities equally. Speech is fluent/normal   CBC    Component Value Date/Time   WBC 6.7 07/31/2020 2100   RBC 2.91 (L) 07/31/2020 2100   HGB 10.5 (L) 12/25/2020 0645   HGB 9.5 (L) 06/26/2020 1527   HCT 31.0 (L) 12/25/2020 0645   HCT 27.6 (L) 06/26/2020 1527   PLT 184 07/31/2020 2100   PLT 203 06/26/2020 1527   MCV 95.9 07/31/2020 2100    MCV 93 06/26/2020 1527   MCH 31.3 07/31/2020 2100   MCHC 32.6 07/31/2020 2100   RDW 13.5 07/31/2020 2100   RDW 11.9 06/26/2020 1527   LYMPHSABS 1.3 07/31/2020 2100   LYMPHSABS 1.0 06/26/2020 1527   MONOABS 0.5 07/31/2020 2100   EOSABS 0.2 07/31/2020 2100   EOSABS 0.2 06/26/2020 1527   BASOSABS 0.1 07/31/2020 2100   BASOSABS 0.0 06/26/2020 1527    BMET    Component Value Date/Time   NA 135 12/25/2020 0645   K 3.2 (L) 12/25/2020 0645   CL 96 (L) 12/25/2020 0645   CO2 27 07/31/2020 2100   GLUCOSE 339 (H) 12/25/2020 0645   BUN 37 (H) 12/25/2020 0645   CREATININE 6.30 (H) 12/25/2020 0645   CREATININE 2.22 (H) 09/07/2013 0845   CALCIUM 8.7 (L) 07/31/2020 2100   GFRNONAA 8 (L) 07/31/2020 2100   GFRAA 9 (L) 07/31/2020 2100    COAGS: Lab Results  Component Value Date   INR 1.2 07/26/2020   INR 1.1 09/14/2019   INR 1.1 09/10/2019     ASSESSMENT/PLAN: This is a 64 y.o. male previous left SFA intervention.  He has known right SFA disease.  He now has new toe ulceration.  He is followed by podiatry for this.  We will plan for aortogram from a left common femoral approach bilateral lower extremity angiography and possible intervention on the right today.  I discussed with him the risk and benefits as well as alternatives he demonstrates good understanding.  Angala Hilgers C. Donzetta Matters, MD [facility] Office: [phone] Pager: [phone]

## 2020-12-25 NOTE — Progress Notes (Signed)
Notified Dr Donzetta Matters CBG 339. No new orders.

## 2020-12-25 NOTE — Telephone Encounter (Signed)
Please Advise

## 2020-12-25 NOTE — Op Note (Signed)
    Patient name: MARJORIE LUSSIER MRN: [id number] DOB: 1956-09-30 Sex: male  12/25/2020 Pre-operative Diagnosis: Critical right lower extremity ischemia with toe ulcer Post-operative diagnosis:  Same Surgeon:  Erlene Quan C. Donzetta Matters, MD Procedure Performed: 1.  Ultrasound-guided cannulation left common femoral artery 2.  Aortogram with bilateral lower extremity runoff 3.  Laser arthrectomy of right SFA and right posterior tibial arteries with 1.5 mm [place] 4.  Balloon angioplasty right posterior tibial artery with 3 mm balloon 5.  Drug-coated balloon angioplasty right SFA with 5 mm [place] 6.  Minx device closure left common femoral artery 7.  Moderate sedation with fentanyl and Versed for 68 minutes   Indications: 64 year old male with a history of left SFA intervention.  He now has worsening ulceration of the right toe though the heel ulceration has completely resolved.  He is now indicated for right lower extremity angiography with possible intervention.  Findings: The aorta and iliac segments have significant calcifications however there are no flow-limiting stenosis.  The right side which is the site of interest has approximately 90% stenosis in the mid SFA.  After intervention this is resolved to less than 10%.  Posterior tibial artery is the dominant runoff to the foot it frankly occludes just after its takeoff.  After atherectomy and balloon angioplasty there is 0% residual stenosis.  There is a peroneal artery to the ankle level this is more diminutive the anterior tibial artery is occluded after its takeoff.  Left lower extremity has multiple less than 50% SFA stenoses.  Posterior tibial artery has proximal 50% stenosis proximally that is the dominant runoff to the foot there is also peroneal artery there the anterior tibial artery is occluded.   Procedure:  The patient was identified in the holding area and taken to room 8.  The patient was then placed supine on the table and prepped and  draped in the usual sterile fashion.  A time out was called.  Ultrasound was used to evaluate the left common femoral artery which was noted to be patent and compressible.  The area was anesthetized 1% lidocaine cannulated micropuncture needle followed by wire sheath.  And images saved the permanent record.  B[place]on wires placed followed by 5 [place] sheath.  Omni catheter was placed at the level of L1 and aortogram was performed followed by bilateral extremity runoff with the above findings.  Notably there were no identifiable renal arteries consistent with need for permanent hemodialysis.  We then crossed the bifurcation with Glidewire advantage and Omni catheter placed a long 6 French sheath patient was fully heparinized.  We crossed all the lesions with V 18 wire confirmed intraluminal access in the posterior tibial artery and weak exchanged for an 014 [place] core wire.  We then performed laser arthrectomy with a 1.5 mm laser first of the SFA at both 50 and 60 and then the posterior tibial artery at 50.  The posterior tibial arteries balloon angioplasty with a 3 x 40 mm balloon with no residual stenosis at completion.  The SFA was initially ballooned with a 5 mm balloon followed by drug-coated balloon angioplasty at nominal pressure for 3 minutes.  Completion demonstrated less than 10% residual stenosis.  Satisfied we exchanged for short 6 [place] sheath deployed minx device.  He tolerated procedure without any complication.  Contrast: 160cc   Isamu Trammel C. Donzetta Matters, MD [facility] Office: [phone] Pager: [phone]

## 2020-12-25 NOTE — Progress Notes (Signed)
Patient and wife was given discharge instruction. Both verbalized understanding.

## 2020-12-25 NOTE — Discharge Instructions (Signed)
Femoral Site Care This sheet gives you information about how to care for yourself after your procedure. Your health care provider may also give you more specific instructions. If you have problems or questions, contact your health care provider. What can I expect after the procedure? After the procedure, it is common to have:  Bruising that usually fades within 1-2 weeks.  Tenderness at the site. Follow these instructions at home: Wound care  Follow instructions from your health care provider about how to take care of your insertion site. Make sure you: ? Wash your hands with soap and water before you change your bandage (dressing). If soap and water are not available, use hand sanitizer. ? Change your dressing as told by your health care provider. ? Leave stitches (sutures), skin glue, or adhesive strips in place. These skin closures may need to stay in place for 2 weeks or longer. If adhesive strip edges start to loosen and curl up, you may trim the loose edges. Do not remove adhesive strips completely unless your health care provider tells you to do that.  Do not take baths, swim, or use a hot tub until your health care provider approves.  You may shower 24-48 hours after the procedure or as told by your health care provider. ? Gently wash the site with plain soap and water. ? Pat the area dry with a clean towel. ? Do not rub the site. This may cause bleeding.  Do not apply powder or lotion to the site. Keep the site clean and dry.  Check your femoral site every day for signs of infection. Check for: ? Redness, swelling, or pain. ? Fluid or blood. ? Warmth. ? Pus or a bad smell. Activity  For the first 2-3 days after your procedure, or as long as directed: ? Avoid climbing stairs as much as possible. ? Do not squat.  Do not lift anything that is heavier than 10 lb (4.5 kg), or the limit that you are told, until your health care provider says that it is safe.  Rest as  directed. ? Avoid sitting for a long time without moving. Get up to take short walks every 1-2 hours.  Do not drive for 24 hours if you were given a medicine to help you relax (sedative). General instructions  Take over-the-counter and prescription medicines only as told by your health care provider.  Keep all follow-up visits as told by your health care provider. This is important. Contact a health care provider if you have:  A fever or chills.  You have redness, swelling, or pain around your insertion site. Get help right away if:  The catheter insertion area swells very fast.  You pass out.  You suddenly start to sweat or your skin gets clammy.  The catheter insertion area is bleeding, and the bleeding does not stop when you hold steady pressure on the area.  The area near or just beyond the catheter insertion site becomes pale, cool, tingly, or numb. These symptoms may represent a serious problem that is an emergency. Do not wait to see if the symptoms will go away. Get medical help right away. Call your local emergency services (911 in the [place] Do not drive yourself to the hospital. Summary  After the procedure, it is common to have bruising that usually fades within 1-2 weeks.  Check your femoral site every day for signs of infection.  Do not lift anything that is heavier than 10 lb (4.5 kg), or the   limit that you are told, until your health care provider says that it is safe. This information is not intended to replace advice given to you by your health care provider. Make sure you discuss any questions you have with your health care provider. Document Revised: 12/29/2017 Document Reviewed: 12/29/2017 Elsevier Patient Education  2020 [employer]  

## 2020-12-25 NOTE — Progress Notes (Addendum)
Jennifer O Neal, Rn called and informed of CBG results  

## 2020-12-26 ENCOUNTER — Telehealth: Payer: Self-pay | Admitting: Endocrinology

## 2020-12-26 DIAGNOSIS — M47816 Spondylosis without myelopathy or radiculopathy, lumbar region: Secondary | ICD-10-CM | POA: Diagnosis not present

## 2020-12-26 DIAGNOSIS — M5136 Other intervertebral disc degeneration, lumbar region: Secondary | ICD-10-CM | POA: Diagnosis not present

## 2020-12-26 NOTE — Telephone Encounter (Signed)
Fax received - placed in [facility] box. FYI

## 2020-12-26 NOTE — Telephone Encounter (Signed)
This was only missing a couple of boxes that needed to be checked, it was done and re faxed back to them

## 2020-12-26 NOTE — Telephone Encounter (Signed)
Amy from [facility] called stating she faxed over request for LMN  yesterday - I informed her I did not see it in patient's chart yet but does not mean we didn't receive the fax yet, she did say the patient is getting new insurance on Jan 1 and the supplies will cost almost $800 after that so she was trying to get this completed for the patient before or by Dec 31st since right now he's covered at 100%.  She said just in case she will go ahead and resend it since I wasn't sure if we got it or not.

## 2020-12-27 ENCOUNTER — Encounter: Payer: Self-pay | Admitting: Podiatry

## 2020-12-27 DIAGNOSIS — E1151 Type 2 diabetes mellitus with diabetic peripheral angiopathy without gangrene: Secondary | ICD-10-CM | POA: Diagnosis not present

## 2020-12-29 DIAGNOSIS — Z992 Dependence on renal dialysis: Secondary | ICD-10-CM | POA: Diagnosis not present

## 2020-12-29 DIAGNOSIS — E1129 Type 2 diabetes mellitus with other diabetic kidney complication: Secondary | ICD-10-CM | POA: Diagnosis not present

## 2020-12-29 DIAGNOSIS — N186 End stage renal disease: Secondary | ICD-10-CM | POA: Diagnosis not present

## 2020-12-31 DIAGNOSIS — N2581 Secondary hyperparathyroidism of renal origin: Secondary | ICD-10-CM | POA: Diagnosis not present

## 2020-12-31 DIAGNOSIS — Z992 Dependence on renal dialysis: Secondary | ICD-10-CM | POA: Diagnosis not present

## 2020-12-31 DIAGNOSIS — I953 Hypotension of hemodialysis: Secondary | ICD-10-CM | POA: Diagnosis not present

## 2020-12-31 DIAGNOSIS — R252 Cramp and spasm: Secondary | ICD-10-CM | POA: Diagnosis not present

## 2020-12-31 DIAGNOSIS — N186 End stage renal disease: Secondary | ICD-10-CM | POA: Diagnosis not present

## 2021-01-01 ENCOUNTER — Other Ambulatory Visit: Payer: Self-pay

## 2021-01-01 ENCOUNTER — Encounter: Payer: Self-pay | Admitting: Podiatry

## 2021-01-01 ENCOUNTER — Ambulatory Visit: Payer: BC Managed Care – PPO | Admitting: Podiatry

## 2021-01-01 DIAGNOSIS — R252 Cramp and spasm: Secondary | ICD-10-CM | POA: Diagnosis not present

## 2021-01-01 DIAGNOSIS — N2581 Secondary hyperparathyroidism of renal origin: Secondary | ICD-10-CM | POA: Diagnosis not present

## 2021-01-01 DIAGNOSIS — Z992 Dependence on renal dialysis: Secondary | ICD-10-CM | POA: Diagnosis not present

## 2021-01-01 DIAGNOSIS — L89890 Pressure ulcer of other site, unstageable: Secondary | ICD-10-CM

## 2021-01-01 DIAGNOSIS — L89623 Pressure ulcer of left heel, stage 3: Secondary | ICD-10-CM | POA: Diagnosis not present

## 2021-01-01 DIAGNOSIS — N186 End stage renal disease: Secondary | ICD-10-CM | POA: Diagnosis not present

## 2021-01-01 DIAGNOSIS — I739 Peripheral vascular disease, unspecified: Secondary | ICD-10-CM

## 2021-01-01 DIAGNOSIS — I953 Hypotension of hemodialysis: Secondary | ICD-10-CM | POA: Diagnosis not present

## 2021-01-01 NOTE — Progress Notes (Signed)
Subjective:  Patient ID: Duane Hall, male    DOB: 02/12/1956,  MRN: [id number]  Chief Complaint  Patient presents with  . Diabetic Ulcer    "the right toe seems worse and still has an odor, but the left heel is about the same."     65 y.o. male returns with the above complaint. History confirmed with patient.   Here with his wife today.  I spoke with him over the weekend and the hallux had some drainage.  The applied Betadine is dried out.  He is now 1 week status post right lower extremity angiography with Dr. Donzetta Matters   Objective:  Physical Exam:   Minimal edema and erythema bilaterally.  Full granular base of the left heel, surrounding hyperkeratosis and nonviable tissue.left heel healing well with mild hyperkeratosis around rim, right posterior heel ulcer has fully healed.  No signs of infection of either wound.  Unable to palpate PT pulse on the right side, however his foot is now warm compared to previous exam.  Right hallux eschar remains stable  Findings from angiogram and revascularization from Dr. Donzetta Matters on 12/25/2020: The aorta and iliac segments have significant calcifications however there are no flow-limiting stenosis.  The right side which is the site of interest has approximately 90% stenosis in the mid SFA.  After intervention this is resolved to less than 10%.  Posterior tibial artery is the dominant runoff to the foot it frankly occludes just after its takeoff.  After atherectomy and balloon angioplasty there is 0% residual stenosis.  There is a peroneal artery to the ankle level this is more diminutive the anterior tibial artery is occluded after its takeoff.  Left lower extremity has multiple less than 50% SFA stenoses.  Posterior tibial artery has proximal 50% stenosis proximally that is the dominant runoff to the foot there is also peroneal artery there the anterior tibial artery is occluded.   Assessment:   1. Pressure injury of toe of right foot, unstageable ([place])    2. Pressure injury of left heel, stage 3 ([place])   3. PAD (peripheral artery disease) ([place])      Plan:  Patient was evaluated and treated and all questions answered.  Reviewed the findings of his most recent revascularization both personally and the intraoperative images.  He has inline patent flow via one-vessel runoff in the PT artery to the midfoot unable to visualize any retrograde flow via the first perforator.  His AT occludes in the mid leg and the peroneal ends at the ankle.  Discussed the patient and his wife that we should continue with the local wound care plan to see if he can now heal this with single-vessel runoff.  I would like to see him back in 3 weeks and we can reevaluate.  If he has progressive necrosis of the hallux at that point could consider amputation.  I do not think he would be able to heal a digital amputation.  Transmetatarsal amputation is a possibility but would still be with a high risk of failure and dehiscence with single-vessel runoff.  If we consider this we will perform Doppler exam to see if we can detect any retrograde flow to the dorsal skin flaps.  I discussed with him that beyond transmetatarsal amputation there is not much of a functional amputation within the foot and that below-knee amputation with a prosthetic would likely be his best bet at that point.  At this point, it is still early will continue  local wound care and reevaluate as he progresses.  -No significant debridement performed today -Right heel remains healed - Continue offloading while in bed -Continue local wound care at home with Santyl and wet-to-dry dressings daily or every other day.  Would still avoid bathing and soaking in a splint -May bear full weight on the right foot, toe-touch weightbearing on the left foot

## 2021-01-02 DIAGNOSIS — R531 Weakness: Secondary | ICD-10-CM | POA: Diagnosis not present

## 2021-01-02 DIAGNOSIS — M545 Low back pain, unspecified: Secondary | ICD-10-CM | POA: Diagnosis not present

## 2021-01-02 DIAGNOSIS — R260 Ataxic gait: Secondary | ICD-10-CM | POA: Diagnosis not present

## 2021-01-03 ENCOUNTER — Encounter: Payer: Self-pay | Admitting: Podiatry

## 2021-01-03 DIAGNOSIS — I953 Hypotension of hemodialysis: Secondary | ICD-10-CM | POA: Diagnosis not present

## 2021-01-03 DIAGNOSIS — N2581 Secondary hyperparathyroidism of renal origin: Secondary | ICD-10-CM | POA: Diagnosis not present

## 2021-01-03 DIAGNOSIS — R252 Cramp and spasm: Secondary | ICD-10-CM | POA: Diagnosis not present

## 2021-01-03 DIAGNOSIS — Z992 Dependence on renal dialysis: Secondary | ICD-10-CM | POA: Diagnosis not present

## 2021-01-03 DIAGNOSIS — N186 End stage renal disease: Secondary | ICD-10-CM | POA: Diagnosis not present

## 2021-01-04 DIAGNOSIS — I953 Hypotension of hemodialysis: Secondary | ICD-10-CM | POA: Diagnosis not present

## 2021-01-04 DIAGNOSIS — N186 End stage renal disease: Secondary | ICD-10-CM | POA: Diagnosis not present

## 2021-01-04 DIAGNOSIS — N2581 Secondary hyperparathyroidism of renal origin: Secondary | ICD-10-CM | POA: Diagnosis not present

## 2021-01-04 DIAGNOSIS — Z992 Dependence on renal dialysis: Secondary | ICD-10-CM | POA: Diagnosis not present

## 2021-01-04 DIAGNOSIS — R252 Cramp and spasm: Secondary | ICD-10-CM | POA: Diagnosis not present

## 2021-01-07 DIAGNOSIS — Z992 Dependence on renal dialysis: Secondary | ICD-10-CM | POA: Diagnosis not present

## 2021-01-07 DIAGNOSIS — N2581 Secondary hyperparathyroidism of renal origin: Secondary | ICD-10-CM | POA: Diagnosis not present

## 2021-01-07 DIAGNOSIS — I953 Hypotension of hemodialysis: Secondary | ICD-10-CM | POA: Diagnosis not present

## 2021-01-07 DIAGNOSIS — R252 Cramp and spasm: Secondary | ICD-10-CM | POA: Diagnosis not present

## 2021-01-07 DIAGNOSIS — N186 End stage renal disease: Secondary | ICD-10-CM | POA: Diagnosis not present

## 2021-01-08 DIAGNOSIS — I953 Hypotension of hemodialysis: Secondary | ICD-10-CM | POA: Diagnosis not present

## 2021-01-08 DIAGNOSIS — N186 End stage renal disease: Secondary | ICD-10-CM | POA: Diagnosis not present

## 2021-01-08 DIAGNOSIS — N2581 Secondary hyperparathyroidism of renal origin: Secondary | ICD-10-CM | POA: Diagnosis not present

## 2021-01-08 DIAGNOSIS — R252 Cramp and spasm: Secondary | ICD-10-CM | POA: Diagnosis not present

## 2021-01-08 DIAGNOSIS — Z992 Dependence on renal dialysis: Secondary | ICD-10-CM | POA: Diagnosis not present

## 2021-01-09 ENCOUNTER — Other Ambulatory Visit: Payer: Self-pay

## 2021-01-09 ENCOUNTER — Encounter: Payer: BC Managed Care – PPO | Attending: Endocrinology | Admitting: Nutrition

## 2021-01-09 DIAGNOSIS — M545 Low back pain, unspecified: Secondary | ICD-10-CM | POA: Diagnosis not present

## 2021-01-09 DIAGNOSIS — E1165 Type 2 diabetes mellitus with hyperglycemia: Secondary | ICD-10-CM | POA: Insufficient documentation

## 2021-01-09 DIAGNOSIS — R531 Weakness: Secondary | ICD-10-CM | POA: Diagnosis not present

## 2021-01-09 DIAGNOSIS — R260 Ataxic gait: Secondary | ICD-10-CM | POA: Diagnosis not present

## 2021-01-10 NOTE — Progress Notes (Signed)
Duane Hall is here with his wife to be trained on how to use the OmniPod insulin pump.  Settings were put in per Dr. Cordelia Pen orders:  Basal rate: 0.1u/hr, I/C ratio: 30, ISF: 100, timing 6 hours.  Target: 100 with correction over 120.  He re demonstrated how to give a bolus X3 without difficulty. He is using the [place] and will use the readings for his bolus calculation.  He was reminded that these readings are not his blood sugar, and that for a more accurate reading, he should always test his blood sugar.  He is also treating lows until sensor shows higher readings, and we discussed the proper protocol for lows when wearing a CGM.   We reviewed pump protocols for sick days, how to use the temp basal rate, and how to use an extended bolus for high fat meals.  He reported good understanding of this and he was encouraged to read over the manual and call the 800 help line if questions.  He will see Dr. Loanne Drilling on Friday.   He prefers to count carbs rather than use his only way of giving a bolus.  We discussed what carbs were, and he was given a list of 15 gram portions sizes of all carbs as well as a brohcure of fast food restaurants with carb calculations for their meals.  He had no final questions.

## 2021-01-10 NOTE — Patient Instructions (Signed)
Stop all novolin insulin Fill and apply a pod every 3 days  Read owners manual and call help line if questions.

## 2021-01-11 ENCOUNTER — Emergency Department (HOSPITAL_COMMUNITY): Payer: BC Managed Care – PPO

## 2021-01-11 ENCOUNTER — Inpatient Hospital Stay (HOSPITAL_COMMUNITY)
Admission: EM | Admit: 2021-01-11 | Discharge: 2021-01-13 | DRG: 853 | Disposition: A | Discharge status: Home / Self Care | Payer: BC Managed Care – PPO | Attending: Internal Medicine | Admitting: Internal Medicine

## 2021-01-11 ENCOUNTER — Telehealth: Payer: Self-pay | Admitting: *Deleted

## 2021-01-11 ENCOUNTER — Telehealth: Payer: Self-pay | Admitting: Podiatry

## 2021-01-11 ENCOUNTER — Encounter (HOSPITAL_COMMUNITY): Payer: Self-pay | Admitting: Emergency Medicine

## 2021-01-11 ENCOUNTER — Other Ambulatory Visit: Payer: Self-pay

## 2021-01-11 DIAGNOSIS — E1169 Type 2 diabetes mellitus with other specified complication: Secondary | ICD-10-CM | POA: Diagnosis present

## 2021-01-11 DIAGNOSIS — Z7982 Long term (current) use of aspirin: Secondary | ICD-10-CM

## 2021-01-11 DIAGNOSIS — M869 Osteomyelitis, unspecified: Secondary | ICD-10-CM | POA: Diagnosis not present

## 2021-01-11 DIAGNOSIS — I708 Atherosclerosis of other arteries: Secondary | ICD-10-CM | POA: Diagnosis not present

## 2021-01-11 DIAGNOSIS — Z794 Long term (current) use of insulin: Secondary | ICD-10-CM

## 2021-01-11 DIAGNOSIS — E1122 Type 2 diabetes mellitus with diabetic chronic kidney disease: Secondary | ICD-10-CM

## 2021-01-11 DIAGNOSIS — R252 Cramp and spasm: Secondary | ICD-10-CM | POA: Diagnosis not present

## 2021-01-11 DIAGNOSIS — I120 Hypertensive chronic kidney disease with stage 5 chronic kidney disease or end stage renal disease: Secondary | ICD-10-CM | POA: Diagnosis not present

## 2021-01-11 DIAGNOSIS — Z992 Dependence on renal dialysis: Secondary | ICD-10-CM | POA: Diagnosis not present

## 2021-01-11 DIAGNOSIS — D631 Anemia in chronic kidney disease: Secondary | ICD-10-CM | POA: Diagnosis present

## 2021-01-11 DIAGNOSIS — Z7902 Long term (current) use of antithrombotics/antiplatelets: Secondary | ICD-10-CM | POA: Diagnosis not present

## 2021-01-11 DIAGNOSIS — Z833 Family history of diabetes mellitus: Secondary | ICD-10-CM

## 2021-01-11 DIAGNOSIS — E871 Hypo-osmolality and hyponatremia: Secondary | ICD-10-CM | POA: Diagnosis present

## 2021-01-11 DIAGNOSIS — N2581 Secondary hyperparathyroidism of renal origin: Secondary | ICD-10-CM | POA: Diagnosis not present

## 2021-01-11 DIAGNOSIS — I739 Peripheral vascular disease, unspecified: Secondary | ICD-10-CM

## 2021-01-11 DIAGNOSIS — L03115 Cellulitis of right lower limb: Secondary | ICD-10-CM | POA: Diagnosis present

## 2021-01-11 DIAGNOSIS — J449 Chronic obstructive pulmonary disease, unspecified: Secondary | ICD-10-CM | POA: Diagnosis present

## 2021-01-11 DIAGNOSIS — L03039 Cellulitis of unspecified toe: Secondary | ICD-10-CM | POA: Diagnosis not present

## 2021-01-11 DIAGNOSIS — F17210 Nicotine dependence, cigarettes, uncomplicated: Secondary | ICD-10-CM | POA: Diagnosis present

## 2021-01-11 DIAGNOSIS — L89620 Pressure ulcer of left heel, unstageable: Secondary | ICD-10-CM | POA: Diagnosis present

## 2021-01-11 DIAGNOSIS — E1165 Type 2 diabetes mellitus with hyperglycemia: Secondary | ICD-10-CM | POA: Diagnosis not present

## 2021-01-11 DIAGNOSIS — E11319 Type 2 diabetes mellitus with unspecified diabetic retinopathy without macular edema: Secondary | ICD-10-CM | POA: Diagnosis present

## 2021-01-11 DIAGNOSIS — N186 End stage renal disease: Secondary | ICD-10-CM | POA: Diagnosis not present

## 2021-01-11 DIAGNOSIS — I96 Gangrene, not elsewhere classified: Secondary | ICD-10-CM | POA: Diagnosis not present

## 2021-01-11 DIAGNOSIS — E119 Type 2 diabetes mellitus without complications: Secondary | ICD-10-CM

## 2021-01-11 DIAGNOSIS — Z9889 Other specified postprocedural states: Secondary | ICD-10-CM | POA: Diagnosis not present

## 2021-01-11 DIAGNOSIS — Z20822 Contact with and (suspected) exposure to covid-19: Secondary | ICD-10-CM | POA: Diagnosis present

## 2021-01-11 DIAGNOSIS — L03031 Cellulitis of right toe: Secondary | ICD-10-CM | POA: Diagnosis not present

## 2021-01-11 DIAGNOSIS — L03116 Cellulitis of left lower limb: Secondary | ICD-10-CM | POA: Diagnosis not present

## 2021-01-11 DIAGNOSIS — S98911A Complete traumatic amputation of right foot, level unspecified, initial encounter: Secondary | ICD-10-CM | POA: Diagnosis not present

## 2021-01-11 DIAGNOSIS — M7989 Other specified soft tissue disorders: Secondary | ICD-10-CM | POA: Diagnosis not present

## 2021-01-11 DIAGNOSIS — I953 Hypotension of hemodialysis: Secondary | ICD-10-CM | POA: Diagnosis not present

## 2021-01-11 DIAGNOSIS — L97519 Non-pressure chronic ulcer of other part of right foot with unspecified severity: Secondary | ICD-10-CM | POA: Diagnosis not present

## 2021-01-11 DIAGNOSIS — S98111A Complete traumatic amputation of right great toe, initial encounter: Secondary | ICD-10-CM | POA: Diagnosis not present

## 2021-01-11 DIAGNOSIS — L89612 Pressure ulcer of right heel, stage 2: Secondary | ICD-10-CM | POA: Diagnosis present

## 2021-01-11 DIAGNOSIS — Z9641 Presence of insulin pump (external) (internal): Secondary | ICD-10-CM | POA: Diagnosis not present

## 2021-01-11 DIAGNOSIS — E1152 Type 2 diabetes mellitus with diabetic peripheral angiopathy with gangrene: Secondary | ICD-10-CM | POA: Diagnosis present

## 2021-01-11 DIAGNOSIS — I10 Essential (primary) hypertension: Secondary | ICD-10-CM | POA: Diagnosis present

## 2021-01-11 DIAGNOSIS — A419 Sepsis, unspecified organism: Secondary | ICD-10-CM | POA: Diagnosis not present

## 2021-01-11 DIAGNOSIS — E876 Hypokalemia: Secondary | ICD-10-CM | POA: Diagnosis present

## 2021-01-11 DIAGNOSIS — D638 Anemia in other chronic diseases classified elsewhere: Secondary | ICD-10-CM | POA: Diagnosis present

## 2021-01-11 DIAGNOSIS — Z79899 Other long term (current) drug therapy: Secondary | ICD-10-CM

## 2021-01-11 LAB — CBC WITH DIFFERENTIAL/PLATELET
Abs Immature Granulocytes: 0.06 10*3/uL (ref 0.00–0.07)
Basophils Absolute: 0.1 10*3/uL (ref 0.0–0.1)
Basophils Relative: 1 %
Eosinophils Absolute: 0.1 10*3/uL (ref 0.0–0.5)
Eosinophils Relative: 1 %
HCT: 26.6 % — ABNORMAL LOW (ref 39.0–52.0)
Hemoglobin: 9.4 g/dL — ABNORMAL LOW (ref 13.0–17.0)
Immature Granulocytes: 1 %
Lymphocytes Relative: 6 %
Lymphs Abs: 0.8 10*3/uL (ref 0.7–4.0)
MCH: 32.5 pg (ref 26.0–34.0)
MCHC: 35.3 g/dL (ref 30.0–36.0)
MCV: 92 fL (ref 80.0–100.0)
Monocytes Absolute: 0.7 10*3/uL (ref 0.1–1.0)
Monocytes Relative: 5 %
Neutro Abs: 11.5 10*3/uL — ABNORMAL HIGH (ref 1.7–7.7)
Neutrophils Relative %: 86 %
Platelets: 219 10*3/uL (ref 150–400)
RBC: 2.89 MIL/uL — ABNORMAL LOW (ref 4.22–5.81)
RDW: 12.1 % (ref 11.5–15.5)
WBC: 13.1 10*3/uL — ABNORMAL HIGH (ref 4.0–10.5)
nRBC: 0 % (ref 0.0–0.2)

## 2021-01-11 LAB — RESP PANEL BY RT-PCR (FLU A&B, COVID) ARPGX2
Influenza A by PCR: NEGATIVE
Influenza B by PCR: NEGATIVE
SARS Coronavirus 2 by RT PCR: NEGATIVE

## 2021-01-11 LAB — COMPREHENSIVE METABOLIC PANEL
ALT: 22 U/L (ref 0–44)
AST: 22 U/L (ref 15–41)
Albumin: 3.1 g/dL — ABNORMAL LOW (ref 3.5–5.0)
Alkaline Phosphatase: 124 U/L (ref 38–126)
Anion gap: 12 (ref 5–15)
BUN: 11 mg/dL (ref 8–23)
CO2: 30 mmol/L (ref 22–32)
Calcium: 9 mg/dL (ref 8.9–10.3)
Chloride: 89 mmol/L — ABNORMAL LOW (ref 98–111)
Creatinine, Ser: 3.39 mg/dL — ABNORMAL HIGH (ref 0.61–1.24)
GFR, Estimated: 20 mL/min — ABNORMAL LOW (ref >60)
Glucose, Bld: 303 mg/dL — ABNORMAL HIGH (ref 70–99)
Potassium: 3.1 mmol/L — ABNORMAL LOW (ref 3.5–5.1)
Sodium: 131 mmol/L — ABNORMAL LOW (ref 135–145)
Total Bilirubin: 0.5 mg/dL (ref 0.3–1.2)
Total Protein: 6.4 g/dL — ABNORMAL LOW (ref 6.5–8.1)

## 2021-01-11 LAB — HEMOGLOBIN A1C
Hgb A1c MFr Bld: 7.5 % — ABNORMAL HIGH (ref 4.8–5.6)
Mean Plasma Glucose: 168.55 mg/dL

## 2021-01-11 LAB — PROTIME-INR
INR: 1.1 (ref 0.8–1.2)
Prothrombin Time: 14.1 seconds (ref 11.4–15.2)

## 2021-01-11 LAB — LACTIC ACID, PLASMA
Lactic Acid, Venous: 1.6 mmol/L (ref 0.5–1.9)
Lactic Acid, Venous: 1.5 mmol/L (ref 0.5–1.9)
Lactic Acid, Venous: 1 mmol/L (ref 0.5–1.9)

## 2021-01-11 LAB — APTT: aPTT: 31 seconds (ref 24–36)

## 2021-01-11 LAB — CBG MONITORING, ED: Glucose-Capillary: 289 mg/dL — ABNORMAL HIGH (ref 70–99)

## 2021-01-11 IMAGING — DX DG CHEST 1V PORT
1 series · 1 of 1 positions shown · non-contrast
Comparison: [DATE]

CLINICAL DATA: Questionable sepsis. Necrotic great toe a
cellulitis.

EXAM:
PORTABLE CHEST 1 VIEW

[chest ap]
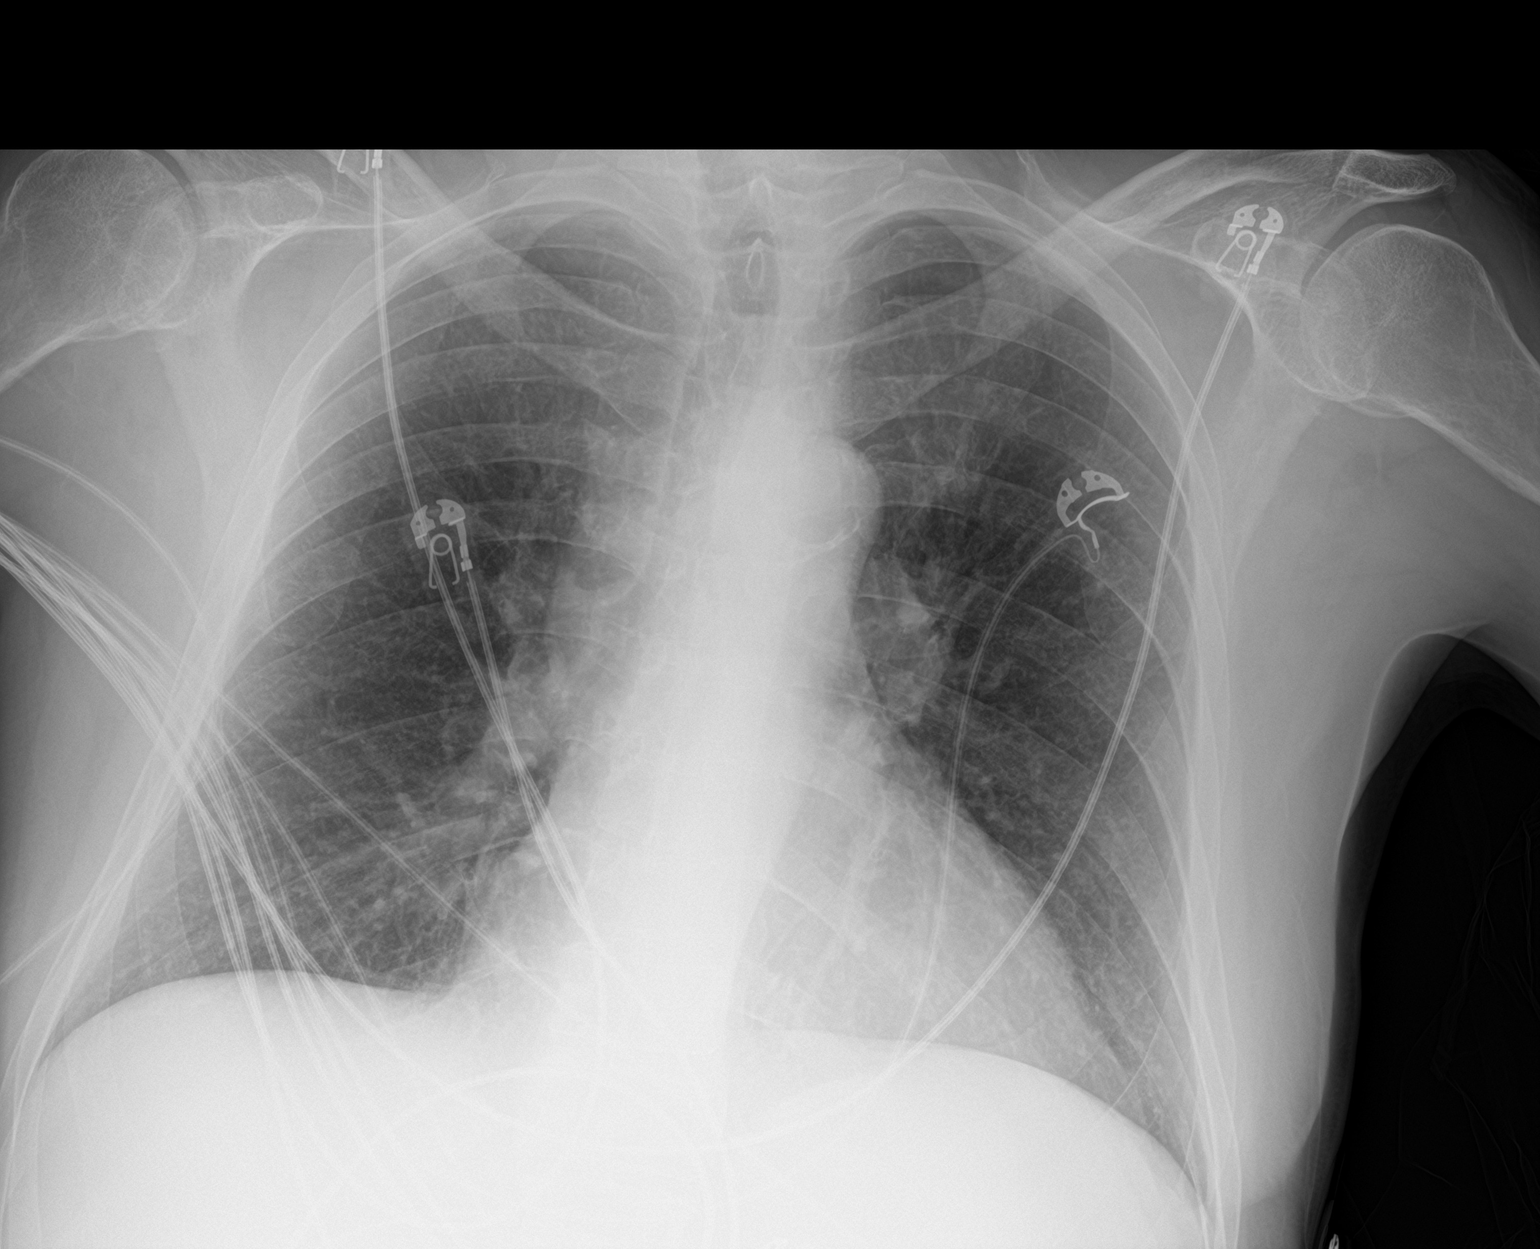

[1 of 1 positions shown; findings below may reference images not displayed]

FINDINGS: Cardiac silhouette is normal in size. No mediastinal or hilar
masses. No evidence of adenopathy.

Clear lungs.  No pleural effusion or pneumothorax.

Skeletal structures are grossly intact.
IMPRESSION: No active disease.

## 2021-01-11 IMAGING — DX DG OS CALCIS 2+V*L*
2 series · 2 of 2 positions shown · non-contrast
Comparison: None.

CLINICAL DATA: Pressure wound to the left heel. Evaluate for
osteomyelitis.

EXAM:
LEFT OS CALCIS - 2+ VIEW

[calcaneus lat]
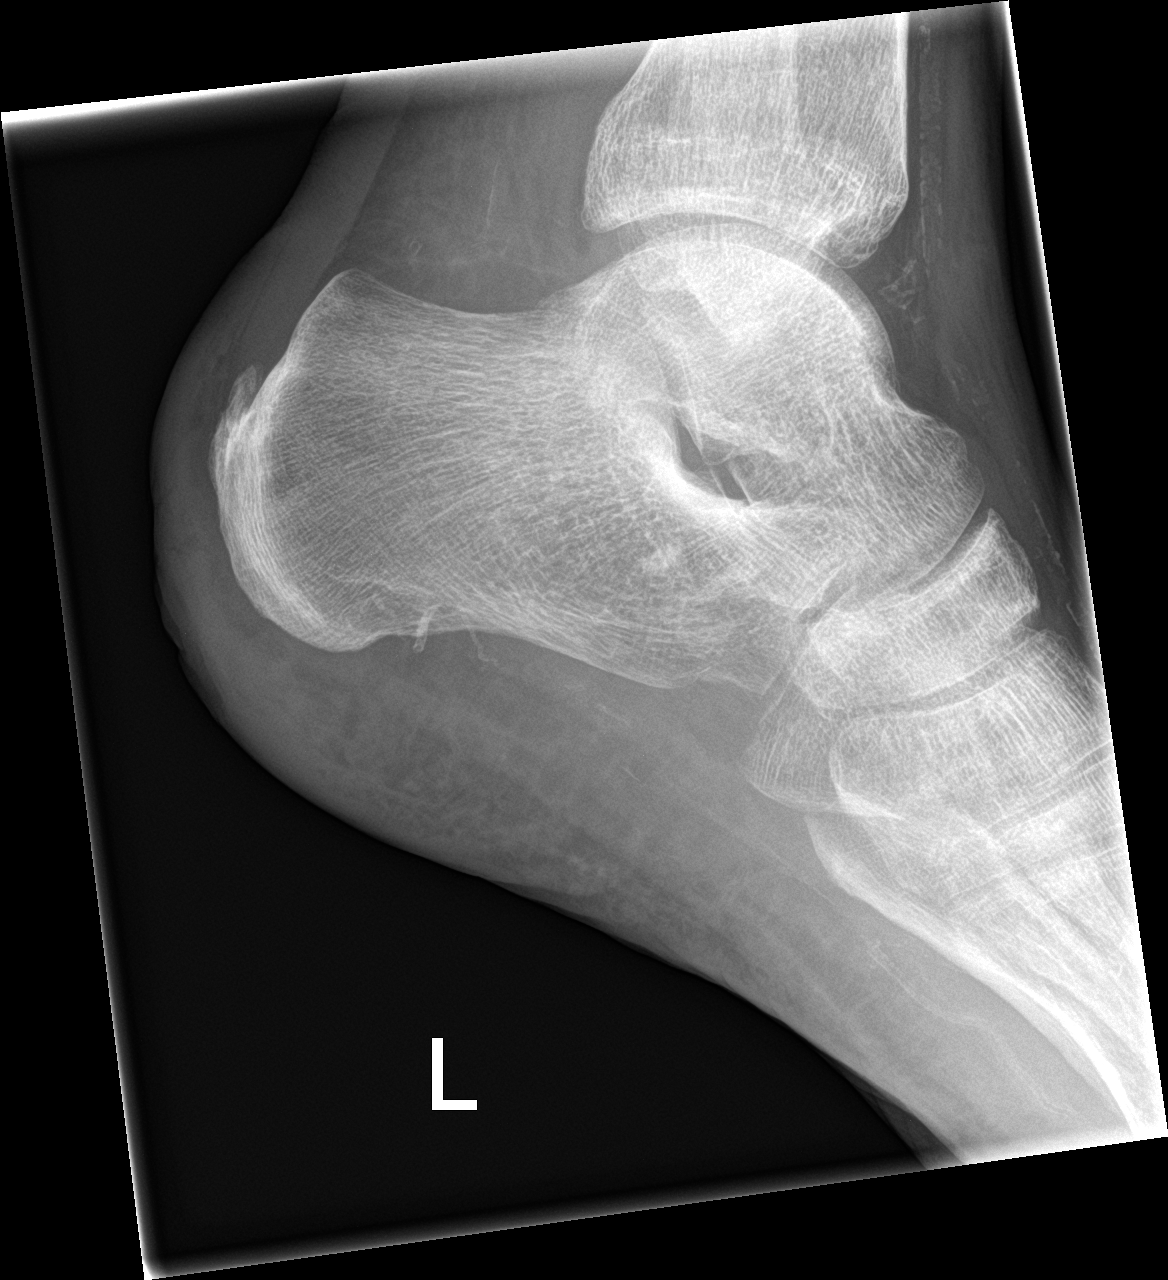

[calcaneus axial]
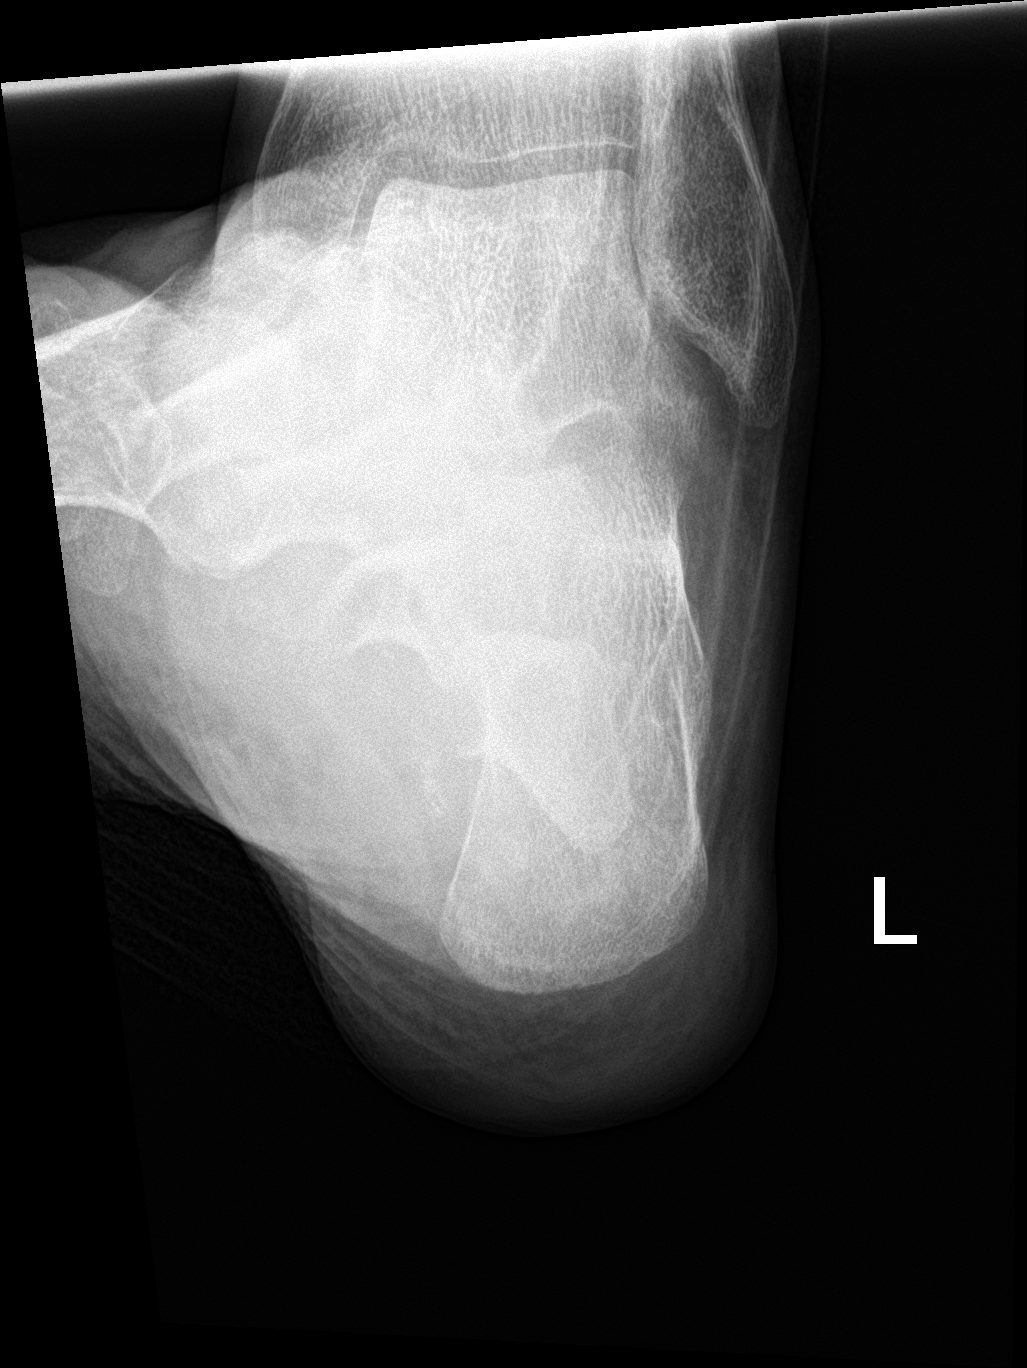

[2 of 2 positions shown; findings below may reference images not displayed]

FINDINGS: No fracture or bone lesion.

There is no bone resorption to suggest osteomyelitis.

Small dorsal calcaneal spur.

Soft tissue swelling is seen across the heel with no soft tissue
air.
IMPRESSION: 1. Soft tissue swelling, but no evidence of osteomyelitis. No soft
tissue air.

## 2021-01-11 IMAGING — DX DG FOOT COMPLETE 3+V*R*
4 series · 4 of 4 positions shown · non-contrast
Comparison: None.

CLINICAL DATA: Necrotic great toe with cellulitis. Assess for
osteomyelitis.

EXAM:
RIGHT FOOT COMPLETE - 3+ VIEW

[foot ap]
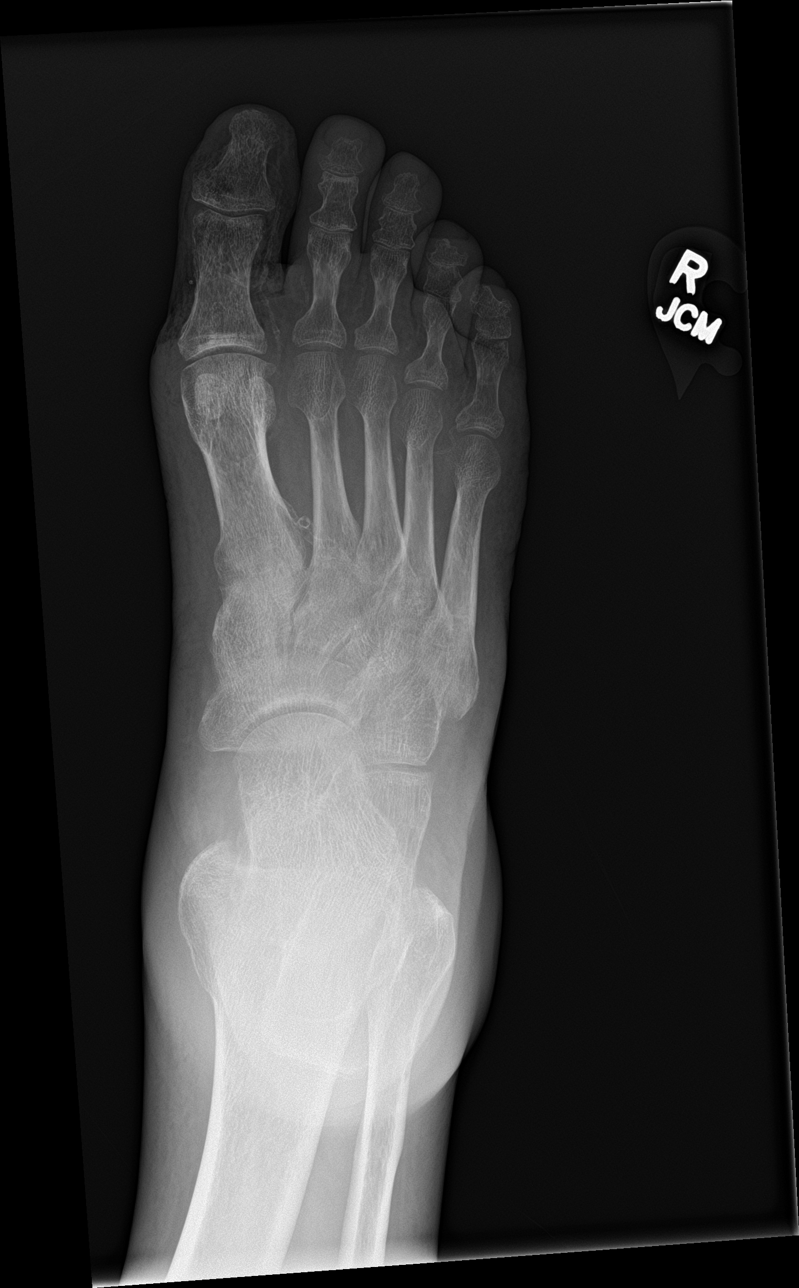

[foot obl (1 of 2)]
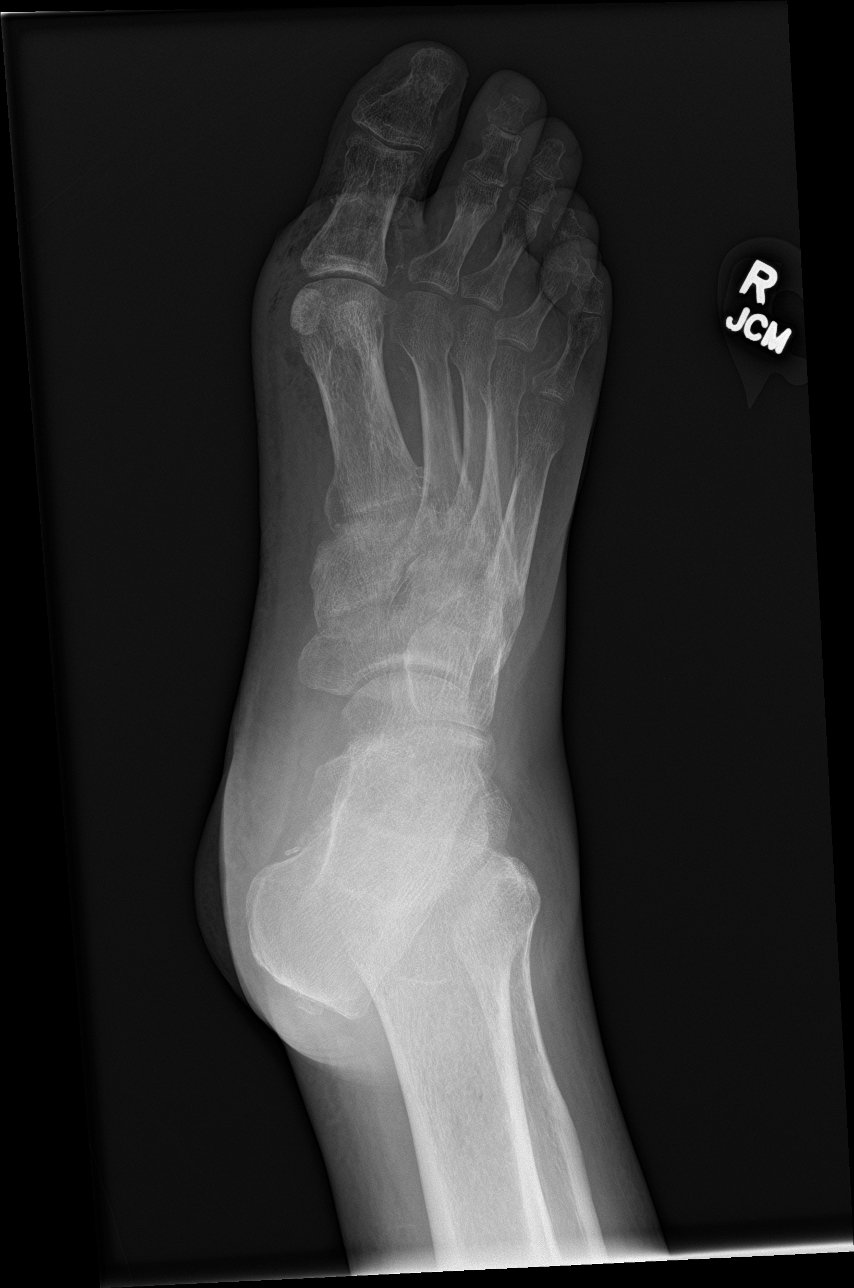

[foot lat]
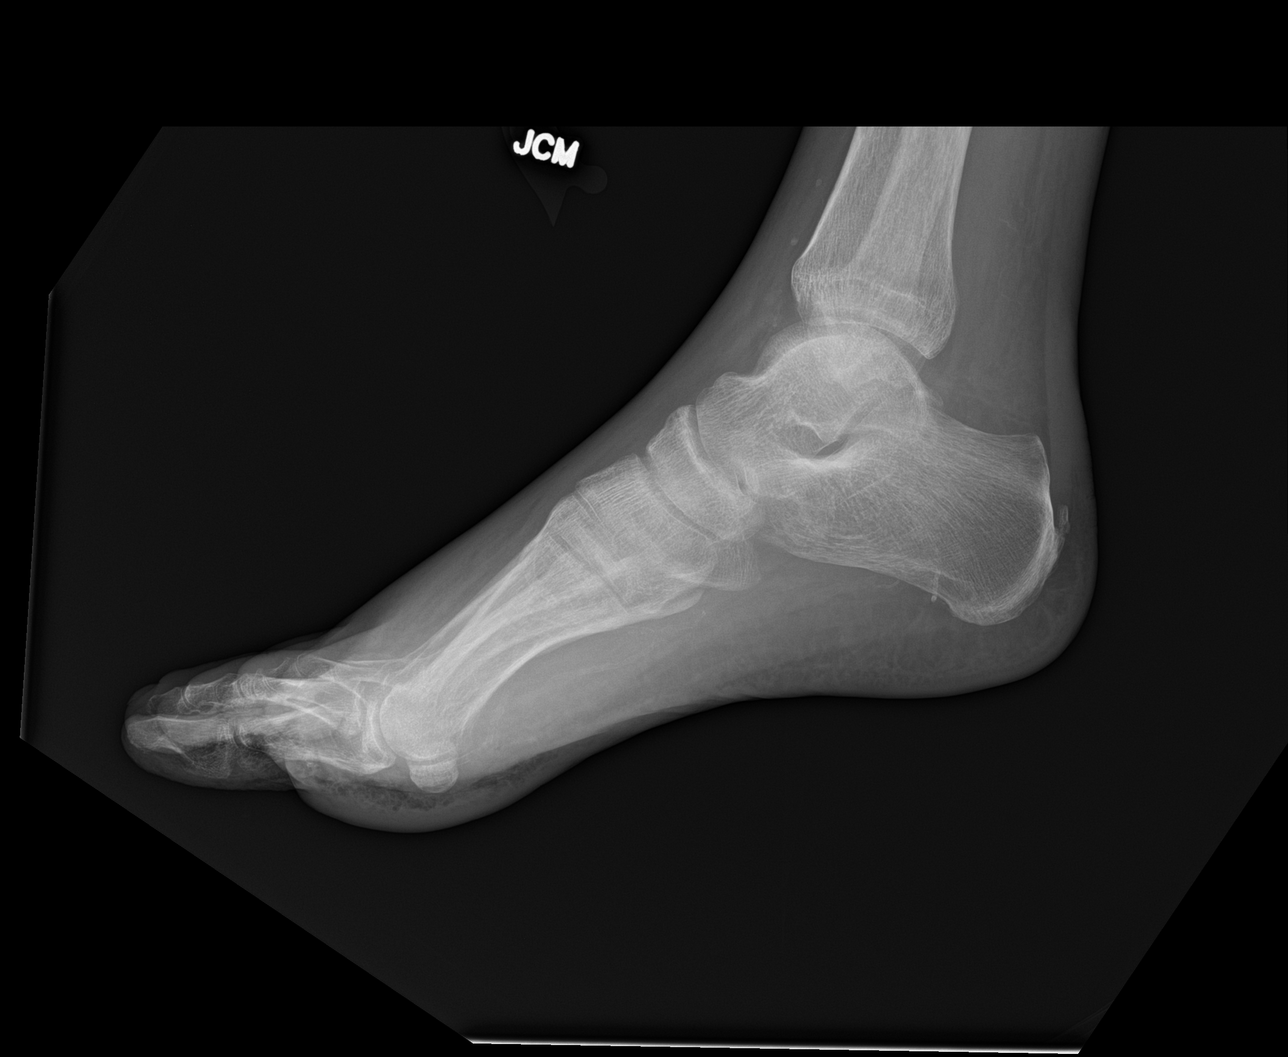

[foot obl (2 of 2)]
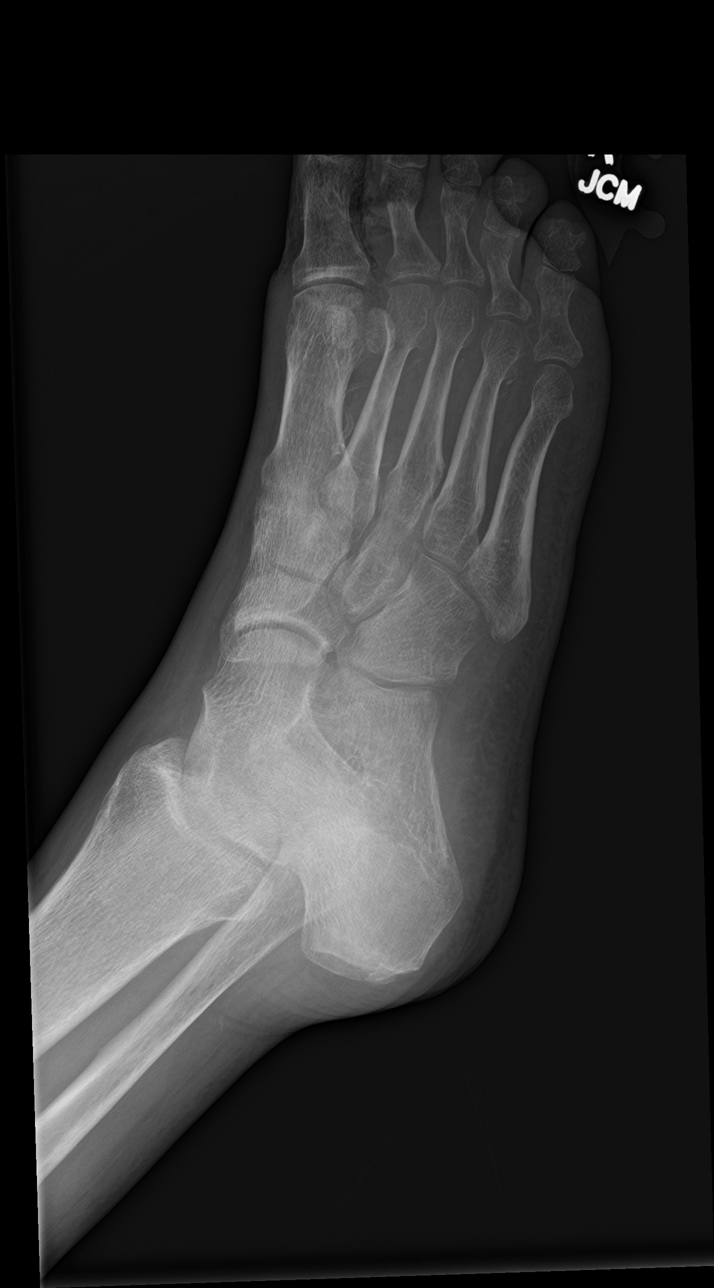

[4 of 4 positions shown; findings below may reference images not displayed]

FINDINGS: Subtle loss of the white cortical line along the medial margin of
the mid to distal great toe proximal phalanx and at the medial base
of the great toe distal phalanx, suspicious for osteomyelitis. There
is overlying soft tissue swelling and soft tissue air.

No other evidence of osteomyelitis.  No fracture or bone lesion.

Joints are normally spaced and aligned.

Soft tissue swelling and air extends to the medial aspect of the
forefoot adjacent to the first metatarsophalangeal joint. There is
additional forefoot soft tissue swelling with no additional soft
tissue air. Vascular calcifications extend from the ankle to the
digital arteries.
IMPRESSION: 1. Subtle evidence of osteomyelitis along the medial aspect of the
proximal phalanx of the great toe and somewhat more quickly at the
medial base of the distal phalanx of the great toe. No other
evidence of osteomyelitis. No fracture or bone lesion.
2. Soft tissue swelling with soft tissue air that extends from the
great toe 2 adjacent to the first metatarsophalangeal joint.

## 2021-01-11 MED ORDER — LACTATED RINGERS IV BOLUS (SEPSIS)
1000.0000 mL | Freq: Once | INTRAVENOUS | Status: AC
  Administered 2021-01-11: 1000 mL via INTRAVENOUS

## 2021-01-11 MED ORDER — RENA-VITE PO TABS
1.0000 | ORAL_TABLET | Freq: Every day | ORAL | Status: DC
  Administered 2021-01-13: 1 via ORAL
  Filled 2021-01-11: qty 1

## 2021-01-11 MED ORDER — LACTATED RINGERS IV BOLUS (SEPSIS)
500.0000 mL | Freq: Once | INTRAVENOUS | Status: AC
  Administered 2021-01-11: 500 mL via INTRAVENOUS

## 2021-01-11 MED ORDER — ROSUVASTATIN CALCIUM 5 MG PO TABS
10.0000 mg | ORAL_TABLET | Freq: Every day | ORAL | Status: DC
  Administered 2021-01-13: 10 mg via ORAL
  Filled 2021-01-11: qty 2

## 2021-01-11 MED ORDER — PANTOPRAZOLE SODIUM 40 MG PO TBEC
40.0000 mg | DELAYED_RELEASE_TABLET | Freq: Every day | ORAL | Status: DC
  Administered 2021-01-13: 40 mg via ORAL
  Filled 2021-01-11: qty 1

## 2021-01-11 MED ORDER — VANCOMYCIN HCL IN DEXTROSE 1-5 GM/200ML-% IV SOLN: 1000.0000 mg | Freq: Once | INTRAVENOUS | Status: DC

## 2021-01-11 MED ORDER — SENNOSIDES-DOCUSATE SODIUM 8.6-50 MG PO TABS
1.0000 | ORAL_TABLET | Freq: Every day | ORAL | Status: DC
  Administered 2021-01-12: 1 via ORAL
  Filled 2021-01-11: qty 1

## 2021-01-11 MED ORDER — GABAPENTIN 100 MG PO CAPS
100.0000 mg | ORAL_CAPSULE | Freq: Three times a day (TID) | ORAL | Status: DC
  Administered 2021-01-12 – 2021-01-13 (×2): 100 mg via ORAL
  Filled 2021-01-11 (×2): qty 1

## 2021-01-11 MED ORDER — ACETAMINOPHEN 325 MG PO TABS: 650.0000 mg | ORAL_TABLET | Freq: Four times a day (QID) | ORAL | Status: DC | PRN

## 2021-01-11 MED ORDER — INSULIN PUMP
SUBCUTANEOUS | Status: DC
  Filled 2021-01-11: qty 1

## 2021-01-11 MED ORDER — SODIUM CHLORIDE 0.9 % IV SOLN: 2.0000 g | Freq: Once | INTRAVENOUS | Status: DC

## 2021-01-11 MED ORDER — LACTATED RINGERS IV SOLN: INTRAVENOUS | Status: DC

## 2021-01-11 MED ORDER — INSULIN ASPART 100 UNIT/ML ~~LOC~~ SOLN: 0.0000 [IU] | SUBCUTANEOUS | Status: DC

## 2021-01-11 MED ORDER — VANCOMYCIN HCL 1500 MG/300ML IV SOLN
1500.0000 mg | Freq: Once | INTRAVENOUS | Status: AC
  Administered 2021-01-11: 1500 mg via INTRAVENOUS
  Filled 2021-01-11: qty 300

## 2021-01-11 MED ORDER — COLLAGENASE 250 UNIT/GM EX OINT
TOPICAL_OINTMENT | Freq: Every day | CUTANEOUS | Status: DC
  Administered 2021-01-12: 1 via TOPICAL
  Filled 2021-01-11: qty 30

## 2021-01-11 MED ORDER — LACTATED RINGERS IV BOLUS (SEPSIS): 1000.0000 mL | Freq: Once | INTRAVENOUS | Status: DC

## 2021-01-11 MED ORDER — ACETAMINOPHEN 650 MG RE SUPP: 650.0000 mg | Freq: Four times a day (QID) | RECTAL | Status: DC | PRN

## 2021-01-11 MED ORDER — SODIUM CHLORIDE 0.9 % IV SOLN
2.0000 g | Freq: Once | INTRAVENOUS | Status: AC
  Administered 2021-01-11: 2 g via INTRAVENOUS
  Filled 2021-01-11: qty 20

## 2021-01-11 MED ORDER — ACETAMINOPHEN 325 MG PO TABS
650.0000 mg | ORAL_TABLET | Freq: Once | ORAL | Status: AC
  Administered 2021-01-11: 650 mg via ORAL
  Filled 2021-01-11: qty 2

## 2021-01-11 MED ORDER — VANCOMYCIN HCL 750 MG/150ML IV SOLN: 750.0000 mg | INTRAVENOUS | Status: DC

## 2021-01-11 MED ORDER — SODIUM CHLORIDE 0.9 % IV SOLN
1.0000 g | INTRAVENOUS | Status: DC
  Administered 2021-01-11 – 2021-01-12 (×2): 1 g via INTRAVENOUS
  Filled 2021-01-11 (×3): qty 1

## 2021-01-11 MED ORDER — FERRIC CITRATE 1 GM 210 MG(FE) PO TABS
210.0000 mg | ORAL_TABLET | Freq: Three times a day (TID) | ORAL | Status: DC
  Administered 2021-01-13: 210 mg via ORAL
  Filled 2021-01-11 (×4): qty 1

## 2021-01-11 MED ORDER — SODIUM CHLORIDE 0.9 % IV SOLN: 2.0000 g | INTRAVENOUS | Status: DC

## 2021-01-11 NOTE — H&P (Signed)
History and Physical    Duane Hall PJA:[id number] DOB: [birth year] DOA: 01/11/2021  PCP: Pieter Partridge, PA  Patient coming from: Home.  Chief Complaint: Fever chills.  HPI: Duane Hall is a 65 y.o. male with history of ESRD on hemodialysis at home, diabetes mellitus on insulin pump, chronic anemia, peripheral vascular disease status post recent procedure on December 25, 2020 by Dr. Donzetta Matters patient underwent laser atherectomy and balloon angioplasty of the right lower extremity arteries has been noticed to have increasing discoloration of the right great toe over the last few weeks and is being acutely worsening last few days.  Patient is followed by podiatrist Dr. Sherryle Lis.  Today after dialysis patient was noticed to have fever and was brought to the ER.  ED Course: In the ER patient was hypotensive with a blood pressure systolic in the 67 temperature 103 F.  Chest x-ray unremarkable.  COVID test was negative.  X-rays of the right foot reveal possible osteomyelitis of the right great toe and also patient's right great toe looks necrotic.  The swelling of the right foot also.  Pulses are dopplerable.  Blood cultures obtained started on empiric antibiotic for sepsis and admitted for further management.  Podiatrist was notified.  Review of Systems: As per HPI, rest all negative.   Past Medical History:  Diagnosis Date  . Asthma   . Cancer ([place])   . Cataract   . CKD (chronic kidney disease), stage III ([place])   . Diabetes mellitus   . Glaucoma   . Vascular insufficiency 05/2020    Past Surgical History:  Procedure Laterality Date  . ABDOMINAL AORTOGRAM W/LOWER EXTREMITY N/A 06/19/2020   Procedure: ABDOMINAL AORTOGRAM W/LOWER EXTREMITY;  Surgeon: Waynetta Sandy, MD;  Location: [facility];  Service: Cardiovascular;  Laterality: N/A;  . ABDOMINAL AORTOGRAM W/LOWER EXTREMITY Left 10/16/2020   Procedure: ABDOMINAL AORTOGRAM W/LOWER EXTREMITY;  Surgeon: Waynetta Sandy, MD;  Location: [facility];  Service: Cardiovascular;  Laterality: Left;  . ABDOMINAL AORTOGRAM W/LOWER EXTREMITY N/A 12/25/2020   Procedure: ABDOMINAL AORTOGRAM W/LOWER EXTREMITY;  Surgeon: Waynetta Sandy, MD;  Location: [facility];  Service: Cardiovascular;  Laterality: N/A;  . AV FISTULA PLACEMENT Right 09/14/2019   Procedure: Right Arm Basilic Vein transposition;  Surgeon: Angelia Mould, MD;  Location: [facility];  Service: Vascular;  Laterality: Right;  . BONE BIOPSY Left 07/29/2020   Procedure: BONE BIOPSY;  Surgeon: [name], DPM;  Location: [facility];  Service: Podiatry;  Laterality: Left;  Need bone trephines and/or large bore Giamshidi  . COLONOSCOPY    . PERIPHERAL VASCULAR ATHERECTOMY Left 06/19/2020   Procedure: PERIPHERAL VASCULAR ATHERECTOMY;  Surgeon: Waynetta Sandy, MD;  Location: [facility];  Service: Cardiovascular;  Laterality: Left;  SFA  . PERIPHERAL VASCULAR ATHERECTOMY  12/25/2020   Procedure: PERIPHERAL VASCULAR ATHERECTOMY;  Surgeon: Waynetta Sandy, MD;  Location: [facility];  Service: Cardiovascular;;  Lt. PT - Laser Lt. SFA - Laser  . PERIPHERAL VASCULAR BALLOON ANGIOPLASTY  12/25/2020   Procedure: PERIPHERAL VASCULAR BALLOON ANGIOPLASTY;  Surgeon: Waynetta Sandy, MD;  Location: [facility];  Service: Cardiovascular;;  Lt. SFA and PT  . UPPER GASTROINTESTINAL ENDOSCOPY  02/2019   Dr Benson Norway       reports that he has been smoking cigarettes. He has been smoking about 0.30 packs per day. He has never used smokeless tobacco. He reports that he does not  drink alcohol and does not use drugs.  Allergies  Allergen Reactions  . Augmentin [Amoxicillin-Pot Clavulanate] Diarrhea  . Midodrine Other (See Comments)    Other reaction(s): Urinary Sensation    Family History  Problem Relation Age of Onset  . Cancer Father   . Diabetes Mother     Prior to Admission medications    Medication Sig Start Date End Date Taking? Authorizing Provider  aspirin EC 81 MG EC tablet Take 1 tablet (81 mg total) by mouth daily. Swallow whole. 06/21/20   Charlynne Cousins, MD  clopidogrel (PLAVIX) 75 MG tablet Take 1 tablet (75 mg total) by mouth daily with breakfast. 06/21/20   Charlynne Cousins, MD  collagenase (SANTYL) ointment Apply 1 application topically See admin instructions. Apply to the left heel daily with a layer the thickness of a nickel 12/25/20   McDonald, Adam R, DPM  Continuous Blood Gluc Sensor (FREESTYLE LIBRE 14 DAY SENSOR) MISC 1 Device by Other route every 14 (fourteen) days. 12/12/20   Renato Shin, MD  ferric citrate (AURYXIA) 1 GM 210 MG(Fe) tablet Take 210 mg by mouth in the morning and at bedtime.     [provider]  gabapentin (NEURONTIN) 100 MG capsule Take 100 mg by mouth 3 (three) times daily. 10/21/20   [provider]  GVOKE HYPOPEN 1-PACK 1 MG/0.2ML SOAJ Inject 1 mg into the skin daily as needed (low blood sugar). 09/20/20   [provider]  insulin aspart (NOVOLOG FLEXPEN) 100 UNIT/ML FlexPen 3 times a day (just before each meal) 07-05-13 units. Patient taking differently: Inject 7-14 Units into the skin See admin instructions. 3 times a day (just before each meal) 7-units in the morning, 9-units at lunch and 14 unit at dinner 11/10/20   Renato Shin, MD  Insulin NPH, Human,, Isophane, (NOVOLIN N FLEXPEN) 100 UNIT/ML Kiwkpen Inject 2 Units into the skin at bedtime. 12/12/20   Renato Shin, MD  Insulin Pen Needle (PEN NEEDLES) 31G X 6 MM MISC 1 Device by Does not apply route in the morning, at noon, in the evening, and at bedtime. 12/12/20   Renato Shin, MD  lidocaine-prilocaine (EMLA) cream Apply 1 application topically See admin instructions. Apply topically to port access one hour prior to dialysis on Sunday, Monday, Wednesday, Thursday 05/12/20   [provider]  Methoxy PEG-Epoetin Beta (MIRCERA IJ) Inject into  the skin every 30 (thirty) days. 07/03/20   [provider]  multivitamin (RENA-VIT) TABS tablet Take 1 tablet by mouth daily.    [provider]  mupirocin ointment (BACTROBAN) 2 % Apply 1 application topically 2 (two) times daily. 11/30/20   McDonald, Stephan Minister, DPM  omeprazole (PRILOSEC) 40 MG capsule Take 40 mg by mouth daily.    [provider]  rosuvastatin (CRESTOR) 10 MG tablet TAKE 1 TABLET BY MOUTH DAILY Patient taking differently: Take 10 mg by mouth daily. 08/17/20   Waynetta Sandy, MD  sennosides-docusate sodium (SENOKOT-S) 8.6-50 MG tablet Take 1 tablet by mouth at bedtime.    [provider]    Physical Exam: Constitutional: Moderately built and nourished. Vitals:   01/11/21 2100 01/11/21 2130 01/11/21 2200 01/11/21 2230  BP: 102/60 (!) 105/57 117/63 (!) 118/58  Pulse: 94 88 87 77  Resp: 15 19 13 12   Temp:  99.9 F (37.7 C)    TempSrc:  Oral    SpO2: 95% 96% 97% 97%  Weight:      Height:  Eyes: Anicteric no pallor. ENMT: No discharge from the ears eyes nose and mouth. Neck: No mass felt.  No neck rigidity. Respiratory: No rhonchi or crepitations. Cardiovascular: S1-S2 heard. Abdomen: Soft nontender bowel sounds present. Musculoskeletal: Right foot great toe looks necrotic.  The swelling of the right foot.  Pulses felt. Skin: Necrotic right great toe.  Mild erythema of the right foot. Neurologic: Alert awake oriented to time place and person.  Moves all extremities. Psychiatric: Appears normal.  Normal affect.   Labs on Admission: I have personally reviewed following labs and imaging studies  CBC: Recent Labs  Lab 01/11/21 1822  WBC 13.1*  NEUTROABS 11.5*  HGB 9.4*  HCT 26.6*  MCV 92.0  PLT 979   Basic Metabolic Panel: Recent Labs  Lab 01/11/21 1822  NA 131*  K 3.1*  CL 89*  CO2 30  GLUCOSE 303*  BUN 11  CREATININE 3.39*  CALCIUM 9.0   GFR: Estimated Creatinine Clearance: 22.6 mL/min (A) (by C-G  formula based on SCr of 3.39 mg/dL (H)). Liver Function Tests: Recent Labs  Lab 01/11/21 1822  AST 22  ALT 22  ALKPHOS 124  BILITOT 0.5  PROT 6.4*  ALBUMIN 3.1*   No results for input(s): LIPASE, AMYLASE in the last 168 hours. No results for input(s): AMMONIA in the last 168 hours. Coagulation Profile: Recent Labs  Lab 01/11/21 1822  INR 1.1   Cardiac Enzymes: No results for input(s): CKTOTAL, CKMB, CKMBINDEX, TROPONINI in the last 168 hours. BNP (last 3 results) No results for input(s): PROBNP in the last 8760 hours. HbA1C: Recent Labs    01/11/21 2151  HGBA1C 7.5*   CBG: Recent Labs  Lab 01/11/21 1817  GLUCAP 289*   Lipid Profile: No results for input(s): CHOL, HDL, LDLCALC, TRIG, CHOLHDL, LDLDIRECT in the last 72 hours. Thyroid Function Tests: No results for input(s): TSH, T4TOTAL, FREET4, T3FREE, THYROIDAB in the last 72 hours. Anemia Panel: No results for input(s): VITAMINB12, FOLATE, FERRITIN, TIBC, IRON, RETICCTPCT in the last 72 hours. Urine analysis:    Component Value Date/Time   COLORURINE AMBER (A) 07/27/2020 1050   APPEARANCEUR CLOUDY (A) 07/27/2020 1050   LABSPEC 1.020 07/27/2020 1050   PHURINE 5.0 07/27/2020 1050   GLUCOSEU 150 (A) 07/27/2020 1050   HGBUR LARGE (A) 07/27/2020 1050   BILIRUBINUR SMALL (A) 07/27/2020 1050   KETONESUR NEGATIVE 07/27/2020 1050   PROTEINUR >=300 (A) 07/27/2020 1050   NITRITE NEGATIVE 07/27/2020 1050   LEUKOCYTESUR NEGATIVE 07/27/2020 1050   Sepsis Labs: @LABRCNTIP (procalcitonin:4,lacticidven:4) ) Recent Results (from the past 240 hour(s))  Resp Panel by RT-PCR (Flu A&B, Covid) Nasopharyngeal Swab     Status: None   Collection Time: 01/11/21  8:14 PM   Specimen: Nasopharyngeal Swab; Nasopharyngeal(NP) swabs in vial transport medium  Result Value Ref Range Status   SARS Coronavirus 2 by RT PCR NEGATIVE NEGATIVE Final    Comment: (NOTE) SARS-CoV-2 target nucleic acids are NOT DETECTED.  The SARS-CoV-2 RNA is  generally detectable in upper respiratory specimens during the acute phase of infection. The lowest concentration of SARS-CoV-2 viral copies this assay can detect is 138 copies/mL. A negative result does not preclude SARS-Cov-2 infection and should not be used as the sole basis for treatment or other patient management decisions. A negative result may occur with  improper specimen collection/handling, submission of specimen other than nasopharyngeal swab, presence of viral mutation(s) within the areas targeted by this assay, and inadequate number of viral copies(<138 copies/mL). A negative result must be  combined with clinical observations, patient history, and epidemiological information. The expected result is Negative.  Fact Sheet for Patients:  EntrepreneurPulse.com.au  Fact Sheet for Healthcare Providers:  IncredibleEmployment.be  This test is no t yet approved or cleared by the [place] FDA and  has been authorized for detection and/or diagnosis of SARS-CoV-2 by FDA under an Emergency Use Authorization (EUA). This EUA will remain  in effect (meaning this test can be used) for the duration of the COVID-19 declaration under Section 564(b)(1) of the Act, 21 U.S.C.section 360bbb-3(b)(1), unless the authorization is terminated  or revoked sooner.       Influenza A by PCR NEGATIVE NEGATIVE Final   Influenza B by PCR NEGATIVE NEGATIVE Final    Comment: (NOTE) The Xpert Xpress SARS-CoV-2/FLU/RSV plus assay is intended as an aid in the diagnosis of influenza from Nasopharyngeal swab specimens and should not be used as a sole basis for treatment. Nasal washings and aspirates are unacceptable for Xpert Xpress SARS-CoV-2/FLU/RSV testing.  Fact Sheet for Patients: EntrepreneurPulse.com.au  Fact Sheet for Healthcare Providers: IncredibleEmployment.be  This test is not yet approved or cleared by the [place] FDA and has been authorized for detection and/or diagnosis of SARS-CoV-2 by FDA under an Emergency Use Authorization (EUA). This EUA will remain in effect (meaning this test can be used) for the duration of the COVID-19 declaration under Section 564(b)(1) of the Act, 21 U.S.C. section 360bbb-3(b)(1), unless the authorization is terminated or revoked.  Performed at [facility], [place] [address] [place], [place] [address]      Radiological Exams on Admission: DG Os Calcis Left  Result Date: 01/11/2021 CLINICAL DATA:  Pressure wound to the left heel. Evaluate for osteomyelitis. EXAM: LEFT OS CALCIS - 2+ VIEW COMPARISON:  None. FINDINGS: No fracture or bone lesion. There is no bone resorption to suggest osteomyelitis. Small dorsal calcaneal spur. Soft tissue swelling is seen across the heel with no soft tissue air. IMPRESSION: 1. Soft tissue swelling, but no evidence of osteomyelitis. No soft tissue air. Electronically Signed   By: Lajean Manes M.D.   On: 01/11/2021 19:44   DG Chest Port 1 View  Result Date: 01/11/2021 CLINICAL DATA:  Questionable sepsis. Necrotic great toe a cellulitis. EXAM: PORTABLE CHEST 1 VIEW COMPARISON:  07/26/2020 FINDINGS: Cardiac silhouette is normal in size. No mediastinal or hilar masses. No evidence of adenopathy. Clear lungs.  No pleural effusion or pneumothorax. Skeletal structures are grossly intact. IMPRESSION: No active disease. Electronically Signed   By: Lajean Manes M.D.   On: 01/11/2021 19:38   DG Foot Complete Right  Result Date: 01/11/2021 CLINICAL DATA:  Necrotic great toe with cellulitis. Assess for osteomyelitis. EXAM: RIGHT FOOT COMPLETE - 3+ VIEW COMPARISON:  None. FINDINGS: Subtle loss of the white cortical line along the medial margin of the mid to distal great toe proximal phalanx and at the medial base of the great toe distal phalanx, suspicious for osteomyelitis. There is overlying soft tissue swelling and soft tissue air. No other  evidence of osteomyelitis.  No fracture or bone lesion. Joints are normally spaced and aligned. Soft tissue swelling and air extends to the medial aspect of the forefoot adjacent to the first metatarsophalangeal joint. There is additional forefoot soft tissue swelling with no additional soft tissue air. Vascular calcifications extend from the ankle to the digital arteries. IMPRESSION: 1. Subtle evidence of osteomyelitis along the medial aspect of the proximal phalanx of the great toe and somewhat more quickly at the  medial base of the distal phalanx of the great toe. No other evidence of osteomyelitis. No fracture or bone lesion. 2. Soft tissue swelling with soft tissue air that extends from the great toe 2 adjacent to the first metatarsophalangeal joint. Electronically Signed   By: Lajean Manes M.D.   On: 01/11/2021 19:43    EKG: Independently reviewed.  Sinus tachycardia.  Assessment/Plan Principal Problem:   Sepsis ([place]) Active Problems:   Type II diabetes mellitus ([place])   Hypertension   Anemia of chronic disease   End-stage renal disease on hemodialysis ([facility])   Cellulitis of right foot    1. Sepsis likely source is right foot with right great toe appearing necrotic with x-ray showing possible osteomyelitis.  Follow blood cultures continue empiric antibiotics podiatrist has been consulted.  We will keep patient n.p.o. past midnight except for medication in anticipation of possible procedure. 2. Peripheral vascular disease status post recent procedure for right lower extremity by Dr. Donzetta Matters.  Was done on December 25, 2020 patient underwent laser atherectomy and balloon angioplasty.  Patient takes aspirin Plavix and statins.  Patient has taken his aspirin Plavix today.  Will hold aspirin and Plavix for now in anticipation of possible procedure by patient's podiatrist.  If procedure is not planned restart Plavix aspirin. 3. ESRD on hemodialysis -please consult nephrology for dialysis. 4. Diabetes  mellitus type 2 on insulin pump. 5. Chronic anemia likely from ESRD follow CBC.  Since patient has septic picture on presentation with possible osteomyelitis and necrotic looking right great toe will need inpatient status.   DVT prophylaxis: SCDs. Code Status: Full code. Family Communication: Patient's wife. Disposition Plan: Home when stable. Consults called: Podiatrist. Admission status: Inpatient.   Rise Patience MD [facility] Pager [phone].  If 7PM-7AM, please contact night-coverage [email or web address] Password [facility]  01/11/2021, 11:14 PM

## 2021-01-11 NOTE — Telephone Encounter (Signed)
Patient's wife is calling with concerns about the right great toe which has an strong odor coming from the area,has a low grade fever and blood sugar levels are high.Please advise.  Returned call back to wife and informed per Dr Sherryle Lis she should take patient to ER immediately to be evaluated. She verbalized understanding and said that she will keep [place] informed.

## 2021-01-11 NOTE — Progress Notes (Signed)
Following for Code Sepsis  

## 2021-01-11 NOTE — ED Notes (Addendum)
Pt reports having an Omnipod insulin pump and is requesting to not receive Novolog insulin or cbg checks. Pt would like to control his insulin and sugars independently. Admitting MD notified. Pt checked his sugar at this time and reports sugar of 410 and is covering his insulin via Omnipod.

## 2021-01-11 NOTE — ED Triage Notes (Signed)
Patient brought in by wife from with concern of an infected toe. History of dialysis, right arm restricted. Patient is lethargic.

## 2021-01-11 NOTE — Telephone Encounter (Signed)
Pt wife called and stated that patient has an ulcer on his toe and it has a really bad odor to it. Also he is running a low grade fever. His wife is wondering what should she do about her husband. Please call patient ASAP

## 2021-01-11 NOTE — ED Notes (Signed)
Patient transported to X-ray 

## 2021-01-11 NOTE — ED Provider Notes (Addendum)
Medical screening examination/treatment/procedure(s) were conducted as a shared visit with non-physician practitioner(s) and myself.  I personally evaluated the patient during the encounter.    Patient with complex medical history including ESRD, diabetes and osteomyelitis.  He is presenting today from tried ankle and foot with a necrotic great toe on the left which has advanced significantly this week per his wife.  There is additional pain, odor, swelling and redness.  Patient is alert.  He is ill in appearance.  No respiratory distress.  Lungs grossly clear.  Heart is regular but tachycardic.  Examination of foot shows a necrotic, dry gangrene of the left great toe with concerning swelling, erythema and fluid collection at the base of the toe.  I agree with plan and management.  Will initiate sepsis protocol with fluid resuscitation and empiric antibiotics.  CRITICAL CARE Performed by: Charlesetta Shanks   Total critical care time: 30 minutes  Critical care time was exclusive of separately billable procedures and treating other patients.  Critical care was necessary to treat or prevent imminent or life-threatening deterioration.  Critical care was time spent personally by me on the following activities: development of treatment plan with patient and/or surrogate as well as nursing, discussions with consultants, evaluation of patient's response to treatment, examination of patient, obtaining history from patient or surrogate, ordering and performing treatments and interventions, ordering and review of laboratory studies, ordering and review of radiographic studies, pulse oximetry and re-evaluation of patient's condition.   Charlesetta Shanks, MD 01/12/21 1659    Charlesetta Shanks, MD 01/12/21 1701

## 2021-01-11 NOTE — ED Provider Notes (Addendum)
[facility] EMERGENCY DEPARTMENT Provider Note   CSN: [id number] Arrival date & time: 01/11/21  1723    History Chief Complaint  Patient presents with  . Code Sepsis    Duane Hall is a 65 y.o. male with past medical history significant for ESRD, today had full session, osteomyelitis, diabetes who presents for evaluation of toe pain and concern for infection.  Followed by [facility].  Apparently had revascularization with Dr. Donzetta Matters with vascular surgery.  They have been watching a wound to his left calcaneus as well as his right great toe.  States it feels like his left calcaneus wound has been healing however his right great toe has been worsening.  Now noticing additional odor.  Entire wound is eschar and necrotic.  Has erythema extending up the dorsum of his right foot.  Today fever at home.  No upper respiratory complaints.  Has not taken anything for fever.  No headache, lightness, dizziness, chest pain, shortness of breath abdominal pain, diarrhea, dysuria, weakness.  Has chronic paresthesias to bilateral lower extremities.  Denies additional aggravating or alleviating factors  History obtained from patient and past medical records. No interpreter used.  HPI     Past Medical History:  Diagnosis Date  . Asthma   . Cancer ([place])   . Cataract   . CKD (chronic kidney disease), stage III ([place])   . Diabetes mellitus   . Glaucoma   . Vascular insufficiency 05/2020    Patient Active Problem List   Diagnosis Date Noted  . Sepsis ([place]) 01/11/2021  . Cellulitis of left foot 01/11/2021  . Left atrial dilation 08/12/2020  . Lumbar back pain 08/12/2020  . Acute osteomyelitis of left calcaneus (HCC)   . Bilateral pseudophakia 07/28/2020  . Stable proliferative diabetic retinopathy of both eyes associated with type 2 diabetes mellitus ([place]) 07/28/2020  . Diabetic foot ulcer ([place]) 07/27/2020  . End-stage renal disease on hemodialysis ([place]) 07/27/2020  .  Gastroparesis diabeticorum ([place]) 07/02/2020  . Vascular insufficiency of extremity 06/18/2020  . Awaiting transplantation of kidney 06/18/2020  . Foot ulcer, left ([place]) 06/18/2020  . Aftercare including intermittent dialysis ([place]) 05/01/2020  . Cramp and spasm 05/01/2020  . Hemodialysis-associated hypotension 05/01/2020  . Aortic atherosclerosis ([place]) 02/28/2020  . Hypoglycemia, unspecified 12/20/2019  . Iron deficiency anemia, unspecified 11/17/2019  . Allergy, unspecified, initial encounter 10/18/2019  . Type 2 diabetes mellitus with diabetic peripheral angiopathy without gangrene ([place]) 10/16/2019  . Other disorders of phosphorus metabolism 10/14/2019  . Secondary hyperparathyroidism of renal origin ([place]) 10/14/2019  . Complication of vascular dialysis catheter 10/09/2019  . Hypercalcemia 10/07/2019  . Hyperlipidemia, unspecified 10/07/2019  . Tobacco use 10/07/2019  . Unspecified glaucoma 10/07/2019  . Encounter for immunization 10/06/2019  . Coagulation defect, unspecified ([place]) 10/05/2019  . End stage renal disease ([place]) 10/05/2019  . Other emphysema ([place]) 11/23/2018  . Benign prostatic hyperplasia with weak urinary stream 11/18/2018  . Pure hypercholesterolemia 11/18/2018  . Uncontrolled type 2 diabetes mellitus with hyperglycemia ([place]) 11/18/2018  . Diabetic retinopathy ([place]) 08/25/2015  . Claudication ([place]) 04/07/2015  . Anemia of chronic disease 09/12/2014  . Hepatitis C 10/15/2013  . Pain in joint, shoulder region 02/10/2013  . Type II diabetes mellitus ([place]) 02/24/2012  . Hypertension 02/24/2012  . Asthma 02/24/2012    Past Surgical History:  Procedure Laterality Date  . ABDOMINAL AORTOGRAM W/LOWER EXTREMITY N/A 06/19/2020   Procedure: ABDOMINAL AORTOGRAM W/LOWER EXTREMITY;  Surgeon: Waynetta Sandy, MD;  Location:  [facility] INVASIVE [facility];  Service: Cardiovascular;  Laterality: N/A;  . ABDOMINAL AORTOGRAM W/LOWER EXTREMITY Left 10/16/2020   Procedure: ABDOMINAL  AORTOGRAM W/LOWER EXTREMITY;  Surgeon: Waynetta Sandy, MD;  Location: [facility];  Service: Cardiovascular;  Laterality: Left;  . ABDOMINAL AORTOGRAM W/LOWER EXTREMITY N/A 12/25/2020   Procedure: ABDOMINAL AORTOGRAM W/LOWER EXTREMITY;  Surgeon: Waynetta Sandy, MD;  Location: [facility];  Service: Cardiovascular;  Laterality: N/A;  . AV FISTULA PLACEMENT Right 09/14/2019   Procedure: Right Arm Basilic Vein transposition;  Surgeon: Angelia Mould, MD;  Location: [facility];  Service: Vascular;  Laterality: Right;  . BONE BIOPSY Left 07/29/2020   Procedure: BONE BIOPSY;  Surgeon: [name], DPM;  Location: [facility];  Service: Podiatry;  Laterality: Left;  Need bone trephines and/or large bore Giamshidi  . COLONOSCOPY    . PERIPHERAL VASCULAR ATHERECTOMY Left 06/19/2020   Procedure: PERIPHERAL VASCULAR ATHERECTOMY;  Surgeon: Waynetta Sandy, MD;  Location: [facility];  Service: Cardiovascular;  Laterality: Left;  SFA  . PERIPHERAL VASCULAR ATHERECTOMY  12/25/2020   Procedure: PERIPHERAL VASCULAR ATHERECTOMY;  Surgeon: Waynetta Sandy, MD;  Location: [facility];  Service: Cardiovascular;;  Lt. PT - Laser Lt. SFA - Laser  . PERIPHERAL VASCULAR BALLOON ANGIOPLASTY  12/25/2020   Procedure: PERIPHERAL VASCULAR BALLOON ANGIOPLASTY;  Surgeon: Waynetta Sandy, MD;  Location: [facility];  Service: Cardiovascular;;  Lt. SFA and PT  . UPPER GASTROINTESTINAL ENDOSCOPY  02/2019   Dr Benson Norway         Family History  Problem Relation Age of Onset  . Cancer Father   . Diabetes Mother     Social History   Tobacco Use  . Smoking status: Current Every Day Smoker    Packs/day: 0.30    Types: Cigarettes  . Smokeless tobacco: Never Used  Vaping Use  . Vaping Use: Never used  Substance Use Topics  . Alcohol use: No    Alcohol/week: 0.0 standard drinks  . Drug use: No    Home Medications Prior to Admission  medications   Medication Sig Start Date End Date Taking? Authorizing Provider  aspirin EC 81 MG EC tablet Take 1 tablet (81 mg total) by mouth daily. Swallow whole. 06/21/20   Charlynne Cousins, MD  clopidogrel (PLAVIX) 75 MG tablet Take 1 tablet (75 mg total) by mouth daily with breakfast. 06/21/20   Charlynne Cousins, MD  collagenase (SANTYL) ointment Apply 1 application topically See admin instructions. Apply to the left heel daily with a layer the thickness of a nickel 12/25/20   McDonald, Adam R, DPM  Continuous Blood Gluc Sensor (FREESTYLE LIBRE 14 DAY SENSOR) MISC 1 Device by Other route every 14 (fourteen) days. 12/12/20   Renato Shin, MD  ferric citrate (AURYXIA) 1 GM 210 MG(Fe) tablet Take 210 mg by mouth in the morning and at bedtime.     [provider]  gabapentin (NEURONTIN) 100 MG capsule Take 100 mg by mouth 3 (three) times daily. 10/21/20   [provider]  GVOKE HYPOPEN 1-PACK 1 MG/0.2ML SOAJ Inject 1 mg into the skin daily as needed (low blood sugar). 09/20/20   [provider]  insulin aspart (NOVOLOG FLEXPEN) 100 UNIT/ML FlexPen 3 times a day (just before each meal) 07-05-13 units. Patient taking differently: Inject 7-14 Units into the skin See admin instructions. 3 times a day (just before each meal) 7-units in the morning, 9-units at lunch and  14 unit at dinner 11/10/20   Renato Shin, MD  Insulin NPH, Human,, Isophane, (NOVOLIN N FLEXPEN) 100 UNIT/ML Kiwkpen Inject 2 Units into the skin at bedtime. 12/12/20   Renato Shin, MD  Insulin Pen Needle (PEN NEEDLES) 31G X 6 MM MISC 1 Device by Does not apply route in the morning, at noon, in the evening, and at bedtime. 12/12/20   Renato Shin, MD  lidocaine-prilocaine (EMLA) cream Apply 1 application topically See admin instructions. Apply topically to port access one hour prior to dialysis on Sunday, Monday, Wednesday, Thursday 05/12/20   [provider]  Methoxy PEG-Epoetin Beta (MIRCERA IJ)  Inject into the skin every 30 (thirty) days. 07/03/20   [provider]  multivitamin (RENA-VIT) TABS tablet Take 1 tablet by mouth daily.    [provider]  mupirocin ointment (BACTROBAN) 2 % Apply 1 application topically 2 (two) times daily. 11/30/20   McDonald, Stephan Minister, DPM  omeprazole (PRILOSEC) 40 MG capsule Take 40 mg by mouth daily.    [provider]  rosuvastatin (CRESTOR) 10 MG tablet TAKE 1 TABLET BY MOUTH DAILY Patient taking differently: Take 10 mg by mouth daily. 08/17/20   Waynetta Sandy, MD  sennosides-docusate sodium (SENOKOT-S) 8.6-50 MG tablet Take 1 tablet by mouth at bedtime.    [provider]    Allergies    Augmentin [amoxicillin-pot clavulanate] and Midodrine  Review of Systems   Review of Systems  Constitutional: Positive for fever.  HENT: Negative.   Respiratory: Negative.   Cardiovascular: Negative.   Gastrointestinal: Negative.   Genitourinary: Negative.   Musculoskeletal:       Right great toe pain  Skin: Positive for wound.  Neurological: Negative.   All other systems reviewed and are negative.   Physical Exam Updated Vital Signs BP 113/64   Pulse 98   Temp (!) 102.6 F (39.2 C) (Oral)   Resp 15   Ht 5\' 9"  (1.753 m)   Wt 77.1 kg   SpO2 98%   BMI 25.10 kg/m   Physical Exam Vitals and nursing note reviewed.  Constitutional:      General: He is not in acute distress.    Appearance: He is well-developed and well-nourished. He is ill-appearing. He is not toxic-appearing or diaphoretic.  HENT:     Head: Normocephalic and atraumatic.     Nose: Nose normal.     Mouth/Throat:     Mouth: Mucous membranes are moist.  Eyes:     Pupils: Pupils are equal, round, and reactive to light.  Cardiovascular:     Rate and Rhythm: Regular rhythm. Tachycardia present.     Pulses:          Dorsalis pedis pulses are detected w/ Doppler on the right side and detected w/ Doppler on the left side.     Heart sounds:  Normal heart sounds.  Pulmonary:     Effort: Pulmonary effort is normal. No respiratory distress.     Breath sounds: Normal breath sounds.  Abdominal:     General: Bowel sounds are normal. There is no distension.     Palpations: Abdomen is soft.     Tenderness: There is no abdominal tenderness. There is no right CVA tenderness, left CVA tenderness or guarding.  Musculoskeletal:        General: Tenderness present. Normal range of motion.     Cervical back: Normal range of motion and neck supple.     Comments: Moves all 4 extremities without difficulty.  No nacrotic right great toe  Skin:    General: Skin is warm and dry.     Capillary Refill: Capillary refill takes 2 to 3 seconds.     Comments: Necrotic right great toe with erythema and warmth extending dorsum of right foot.  Healing wound to left calcaneus.  No drainage, surrounding erythema.  See picture in chart  Neurological:     General: No focal deficit present.     Mental Status: He is alert and oriented to person, place, and time.  Psychiatric:        Mood and Affect: Mood and affect normal.    ED Results / Procedures / Treatments   Labs (all labs ordered are listed, but only abnormal results are displayed) Labs Reviewed  COMPREHENSIVE METABOLIC PANEL - Abnormal; Notable for the following components:      Result Value   Sodium 131 (*)    Potassium 3.1 (*)    Chloride 89 (*)    Glucose, Bld 303 (*)    Creatinine, Ser 3.39 (*)    Total Protein 6.4 (*)    Albumin 3.1 (*)    GFR, Estimated 20 (*)    All other components within normal limits  CBC WITH DIFFERENTIAL/PLATELET - Abnormal; Notable for the following components:   WBC 13.1 (*)    RBC 2.89 (*)    Hemoglobin 9.4 (*)    HCT 26.6 (*)    Neutro Abs 11.5 (*)    All other components within normal limits  CBG MONITORING, ED - Abnormal; Notable for the following components:   Glucose-Capillary 289 (*)    All other components within normal limits  CULTURE, BLOOD  (ROUTINE X 2)  CULTURE, BLOOD (ROUTINE X 2)  CULTURE, BLOOD (ROUTINE X 2)  CULTURE, BLOOD (ROUTINE X 2)  URINE CULTURE  RESP PANEL BY RT-PCR (FLU A&B, COVID) ARPGX2  PROTIME-INR  APTT  LACTIC ACID, PLASMA  LACTIC ACID, PLASMA  URINALYSIS, ROUTINE W REFLEX MICROSCOPIC  HEMOGLOBIN A1C  HIV ANTIBODY (ROUTINE TESTING W REFLEX)  LACTIC ACID, PLASMA  LACTIC ACID, PLASMA    EKG None  Radiology DG Os Calcis Left  Result Date: 01/11/2021 CLINICAL DATA:  Pressure wound to the left heel. Evaluate for osteomyelitis. EXAM: LEFT OS CALCIS - 2+ VIEW COMPARISON:  None. FINDINGS: No fracture or bone lesion. There is no bone resorption to suggest osteomyelitis. Small dorsal calcaneal spur. Soft tissue swelling is seen across the heel with no soft tissue air. IMPRESSION: 1. Soft tissue swelling, but no evidence of osteomyelitis. No soft tissue air. Electronically Signed   By: Lajean Manes M.D.   On: 01/11/2021 19:44   DG Chest Port 1 View  Result Date: 01/11/2021 CLINICAL DATA:  Questionable sepsis. Necrotic great toe a cellulitis. EXAM: PORTABLE CHEST 1 VIEW COMPARISON:  07/26/2020 FINDINGS: Cardiac silhouette is normal in size. No mediastinal or hilar masses. No evidence of adenopathy. Clear lungs.  No pleural effusion or pneumothorax. Skeletal structures are grossly intact. IMPRESSION: No active disease. Electronically Signed   By: Lajean Manes M.D.   On: 01/11/2021 19:38   DG Foot Complete Right  Result Date: 01/11/2021 CLINICAL DATA:  Necrotic great toe with cellulitis. Assess for osteomyelitis. EXAM: RIGHT FOOT COMPLETE - 3+ VIEW COMPARISON:  None. FINDINGS: Subtle loss of the white cortical line along the medial margin of the mid to distal great toe proximal phalanx and at the medial base of the great toe distal phalanx, suspicious for osteomyelitis. There is overlying soft tissue  swelling and soft tissue air. No other evidence of osteomyelitis.  No fracture or bone lesion. Joints are normally  spaced and aligned. Soft tissue swelling and air extends to the medial aspect of the forefoot adjacent to the first metatarsophalangeal joint. There is additional forefoot soft tissue swelling with no additional soft tissue air. Vascular calcifications extend from the ankle to the digital arteries. IMPRESSION: 1. Subtle evidence of osteomyelitis along the medial aspect of the proximal phalanx of the great toe and somewhat more quickly at the medial base of the distal phalanx of the great toe. No other evidence of osteomyelitis. No fracture or bone lesion. 2. Soft tissue swelling with soft tissue air that extends from the great toe 2 adjacent to the first metatarsophalangeal joint. Electronically Signed   By: Lajean Manes M.D.   On: 01/11/2021 19:43   Procedures .Critical Care Performed by: Nettie Elm, PA-C Authorized by: Nettie Elm, PA-C   Critical care provider statement:    Critical care time (minutes):  75   Critical care was necessary to treat or prevent imminent or life-threatening deterioration of the following conditions:  Sepsis   Critical care was time spent personally by me on the following activities:  Discussions with consultants, evaluation of patient's response to treatment, examination of patient, ordering and performing treatments and interventions, ordering and review of laboratory studies, ordering and review of radiographic studies, pulse oximetry, re-evaluation of patient's condition, obtaining history from patient or surrogate and review of old charts   (including critical care time)  Medications Ordered in ED Medications  vancomycin (VANCOREADY) IVPB 1500 mg/300 mL (1,500 mg Intravenous New Bag/Given 01/11/21 1946)  collagenase (SANTYL) ointment (has no administration in time range)  vancomycin (VANCOREADY) IVPB 750 mg/150 mL (has no administration in time range)  rosuvastatin (CRESTOR) tablet 10 mg (has no administration in time range)  ferric citrate  (AURYXIA) tablet 210 mg (has no administration in time range)  pantoprazole (PROTONIX) EC tablet 40 mg (has no administration in time range)  sennosides-docusate sodium (SENOKOT-S) 8.6-50 MG tablet 1 tablet (has no administration in time range)  gabapentin (NEURONTIN) capsule 100 mg (has no administration in time range)  multivitamin (RENA-VIT) tablet 1 tablet (has no administration in time range)  ceFEPIme (MAXIPIME) 2 g in sodium chloride 0.9 % 100 mL IVPB (has no administration in time range)  insulin aspart (novoLOG) injection 0-6 Units (has no administration in time range)  acetaminophen (TYLENOL) tablet 650 mg (has no administration in time range)    Or  acetaminophen (TYLENOL) suppository 650 mg (has no administration in time range)  lactated ringers bolus 1,000 mL (1,000 mLs Intravenous New Bag/Given 01/11/21 1940)    And  lactated ringers bolus 500 mL (500 mLs Intravenous New Bag/Given 01/11/21 2011)  cefTRIAXone (ROCEPHIN) 2 g in sodium chloride 0.9 % 100 mL IVPB (0 g Intravenous Stopped 01/11/21 1929)  acetaminophen (TYLENOL) tablet 650 mg (650 mg Oral Given 01/11/21 1900)   ED Course  I have reviewed the triage vital signs and the nursing notes.  Pertinent labs & imaging results that were available during my care of the patient were reviewed by me and considered in my medical decision making (see chart for details).  65 year old presents for evaluation of toe infection.  Followed by [facility].  On arrival patient is febrile, tachycardic, hypotensive.  Code sepsis called.  Patient has obviously infected right foot with cellulitis and necrotic right great toe. He is ESRD, went to dialysis today.  IV fluids, medications given per sepsis protocol.  No upper respiratory complaints. On recheck patient BP improved to 655 systolic with MAP >37.  Plan on labs, imaging and reassess.  Labs and imaging personally reviewed and interpreted:  CBC with leukocytosis at 48.2 Metabolic  panel mild hyponatremia at 131, potassium 3.1, elevated glucose at 303, creatinine 3.39 at baseline DG chest without infiltrates, cardiomegaly, pulmonary edema DG right foot with likely osteoright great toe and soft tissue changes consistent with cellulitis DG left calcaneus with soft tissue changes however no osteomyelitis  Sepsis reassess- patient with improvement in tachycardia, stable blood pressures with maps greater than 65.  Patient critically ill with sepsis, osteomyelitis, necrotic toe.  Will attempt to contact [facility] where patient follows up outpatient as well as hospitalist for admission for further management  CONSULT with Dr. Britt Bottom with [facility] who right patient in the morning.  Recommends patient n.p.o. after midnight for possible surgical intervention.  He will let Dr. Gwenlyn Saran with vascular surgery now for his consultation as well.  CONSULT with Dr. Hal Hope with [facility] who agrees to evaluate patient for admission.  No need for emergent dialysis currently.  No evidence of fluid overload.  Had full dialysis session today.  Patient seen and evaluated by attending, Dr. Johnney Killian agrees with the above treatment, plan and disposition.  Addend TO ADD cc TIME     MDM Rules/Calculators/A&P                           Final Clinical Impression(s) / ED Diagnoses Final diagnoses:  Sepsis without acute organ dysfunction, due to unspecified organism ([place])  Osteomyelitis of right foot, unspecified type ([place])  Cellulitis of right lower extremity  ESRD (end stage renal disease) ([place])    Rx / DC Orders ED Discharge Orders    None       Birgitta Uhlir A, PA-C 01/11/21 2059    Charlesetta Shanks, MD 01/12/21 1659        Kayleigh Broadwell A, PA-C 01/13/21 Waldron, [place]ana, MD 01/14/21 1645

## 2021-01-11 NOTE — Progress Notes (Addendum)
Pharmacy Antibiotic Note  Duane Hall is a 65 y.o. male admitted on 01/11/2021 with sepsis secondary to right foot with cellulitis and necrotic right great toe. Pharmacy has been consulted for vancomycin dosing. PMH significant for ESRD, last HD today. On [facility] schedule per pt's family member.  Plan: Cefepime 1g q24h  Vancomycin 1500 mg x1 then 750 mg post-HD [facility]. Target trough 15-25 mcg/mL No AUC dosing due to ESRD Monitor cultures, HD schedule, labs, clinical progression  Height: 5\' 9"  (175.3 cm) Weight: 77.1 kg (170 lb) IBW/kg (Calculated) : 70.7  Temp (24hrs), Avg:102.9 F (39.4 C), Min:102.6 F (39.2 C), Max:103.2 F (39.6 C)  No results for input(s): WBC, CREATININE, LATICACIDVEN, VANCOTROUGH, VANCOPEAK, VANCORANDOM, GENTTROUGH, GENTPEAK, GENTRANDOM, TOBRATROUGH, TOBRAPEAK, TOBRARND, AMIKACINPEAK, AMIKACINTROU, AMIKACIN in the last 168 hours.  Estimated Creatinine Clearance: 12.2 mL/min (A) (by C-G formula based on SCr of 6.3 mg/dL (H)).    Allergies  Allergen Reactions  . Augmentin [Amoxicillin-Pot Clavulanate] Diarrhea  . Midodrine Other (See Comments)    Other reaction(s): Urinary Sensation    Antimicrobials this admission: Vancomcyin 1/13 >>  Ceftriaxone 1/13 x1  Dose adjustments this admission:  Microbiology results: 1/13 BCx: sent 1/13 UCx: sent   Thank you for allowing pharmacy to be a part of this patient's care.  Fara Olden, PharmD PGY-1 Pharmacy Resident 01/11/2021 7:06 PM Please see AMION for all pharmacy numbers

## 2021-01-12 ENCOUNTER — Encounter: Payer: Self-pay | Admitting: Endocrinology

## 2021-01-12 ENCOUNTER — Encounter (HOSPITAL_COMMUNITY)
Admission: EM | Disposition: A | Discharge status: Home / Self Care | Payer: Self-pay | Source: Home / Self Care | Attending: Internal Medicine

## 2021-01-12 ENCOUNTER — Inpatient Hospital Stay (HOSPITAL_COMMUNITY): Payer: BC Managed Care – PPO | Admitting: Certified Registered"

## 2021-01-12 ENCOUNTER — Inpatient Hospital Stay (HOSPITAL_COMMUNITY): Payer: BC Managed Care – PPO

## 2021-01-12 ENCOUNTER — Ambulatory Visit: Payer: BC Managed Care – PPO | Admitting: Endocrinology

## 2021-01-12 DIAGNOSIS — L03115 Cellulitis of right lower limb: Secondary | ICD-10-CM

## 2021-01-12 DIAGNOSIS — N2581 Secondary hyperparathyroidism of renal origin: Secondary | ICD-10-CM | POA: Diagnosis not present

## 2021-01-12 DIAGNOSIS — A419 Sepsis, unspecified organism: Secondary | ICD-10-CM | POA: Diagnosis not present

## 2021-01-12 DIAGNOSIS — M869 Osteomyelitis, unspecified: Secondary | ICD-10-CM

## 2021-01-12 DIAGNOSIS — Z992 Dependence on renal dialysis: Secondary | ICD-10-CM | POA: Diagnosis not present

## 2021-01-12 DIAGNOSIS — N186 End stage renal disease: Secondary | ICD-10-CM | POA: Diagnosis not present

## 2021-01-12 DIAGNOSIS — R252 Cramp and spasm: Secondary | ICD-10-CM | POA: Diagnosis not present

## 2021-01-12 DIAGNOSIS — I953 Hypotension of hemodialysis: Secondary | ICD-10-CM | POA: Diagnosis not present

## 2021-01-12 HISTORY — PX: AMPUTATION: SHX166

## 2021-01-12 LAB — GLUCOSE, CAPILLARY
Glucose-Capillary: 226 mg/dL — ABNORMAL HIGH (ref 70–99)
Glucose-Capillary: 273 mg/dL — ABNORMAL HIGH (ref 70–99)
Glucose-Capillary: 168 mg/dL — ABNORMAL HIGH (ref 70–99)
Glucose-Capillary: 180 mg/dL — ABNORMAL HIGH (ref 70–99)

## 2021-01-12 LAB — BASIC METABOLIC PANEL
Anion gap: 15 (ref 5–15)
BUN: 20 mg/dL (ref 8–23)
CO2: 26 mmol/L (ref 22–32)
Calcium: 8.3 mg/dL — ABNORMAL LOW (ref 8.9–10.3)
Chloride: 92 mmol/L — ABNORMAL LOW (ref 98–111)
Creatinine, Ser: 4.77 mg/dL — ABNORMAL HIGH (ref 0.61–1.24)
GFR, Estimated: 13 mL/min — ABNORMAL LOW (ref >60)
Glucose, Bld: 261 mg/dL — ABNORMAL HIGH (ref 70–99)
Potassium: 3.4 mmol/L — ABNORMAL LOW (ref 3.5–5.1)
Sodium: 133 mmol/L — ABNORMAL LOW (ref 135–145)

## 2021-01-12 LAB — HIV ANTIBODY (ROUTINE TESTING W REFLEX): HIV Screen 4th Generation wRfx: NONREACTIVE

## 2021-01-12 IMAGING — DX DG FOOT COMPLETE 3+V*R*
3 series · 3 of 3 positions shown · non-contrast
Comparison: Radiograph [DATE]

CLINICAL DATA: Amputation first ray

EXAM:
RIGHT FOOT COMPLETE - 3+ VIEW

[foot ap]
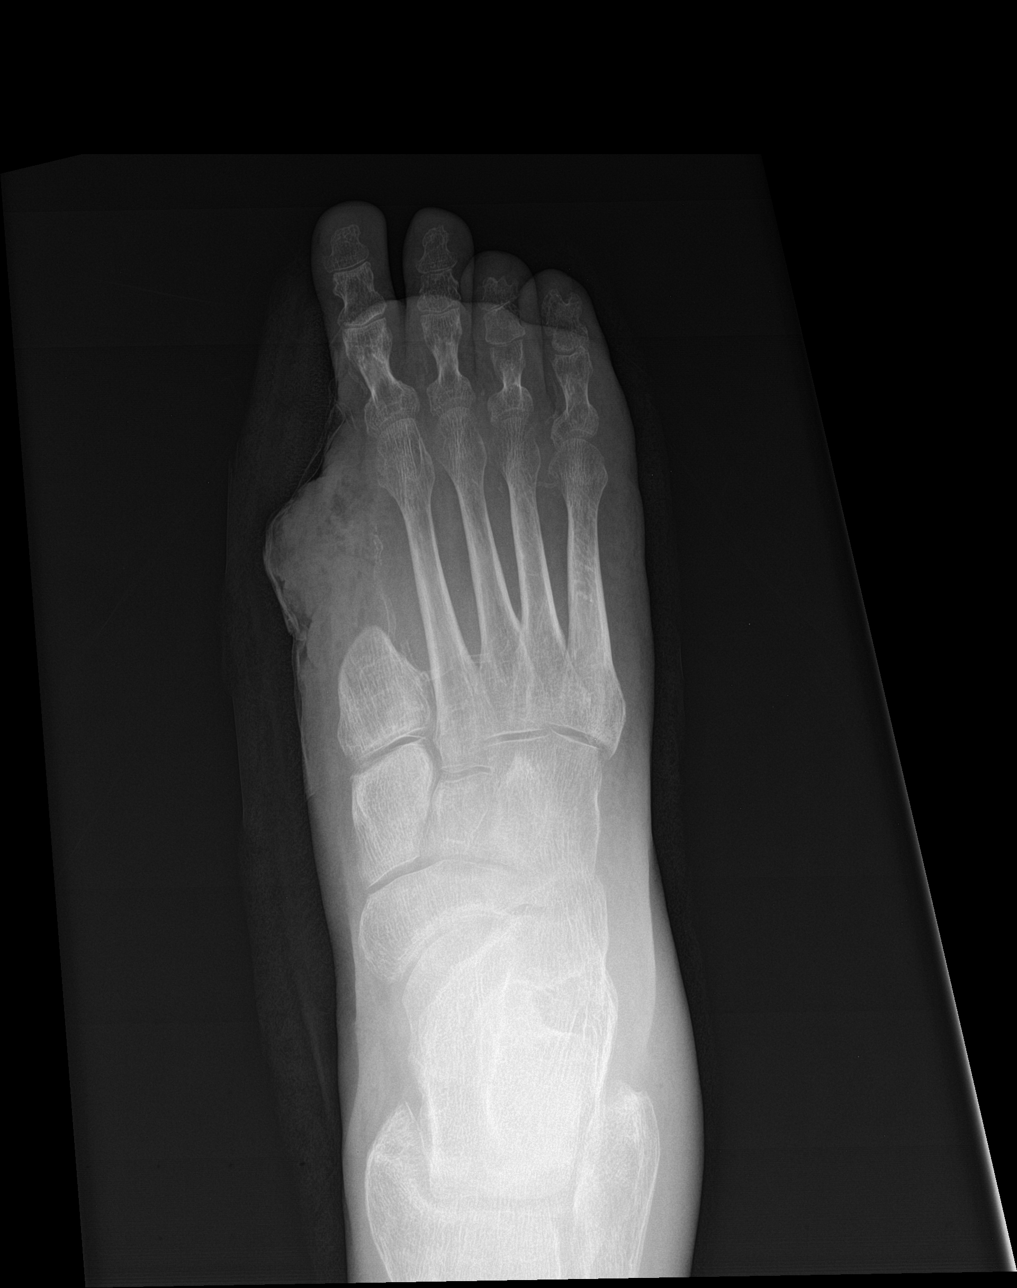

[foot obl]
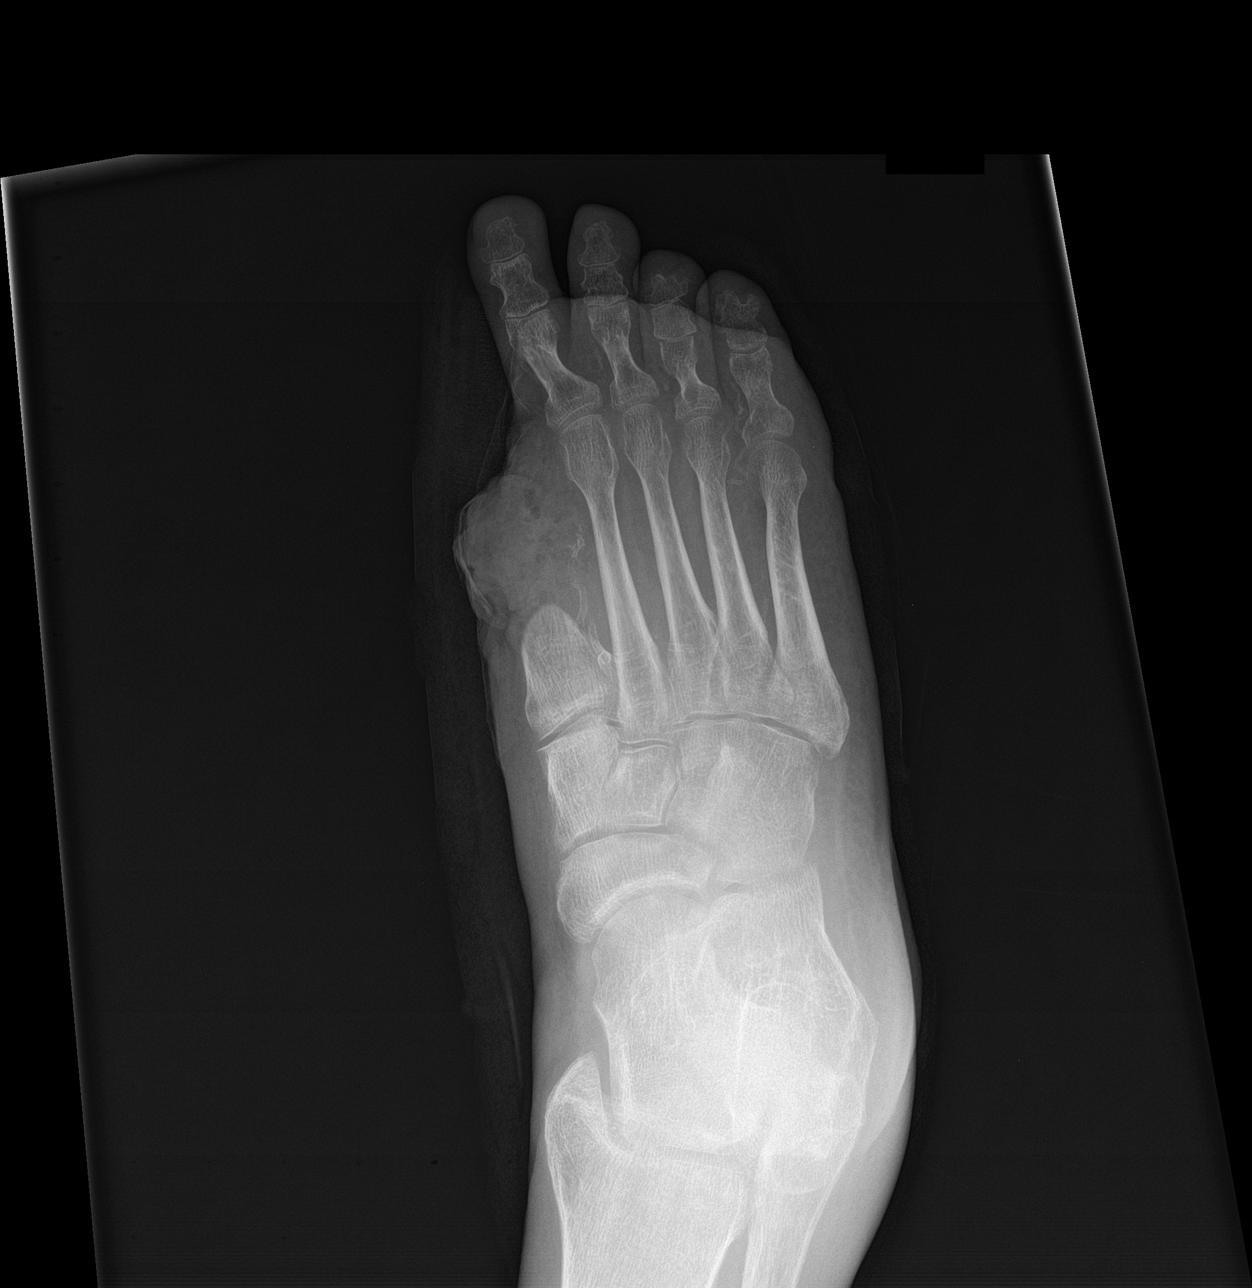

[foot lat]
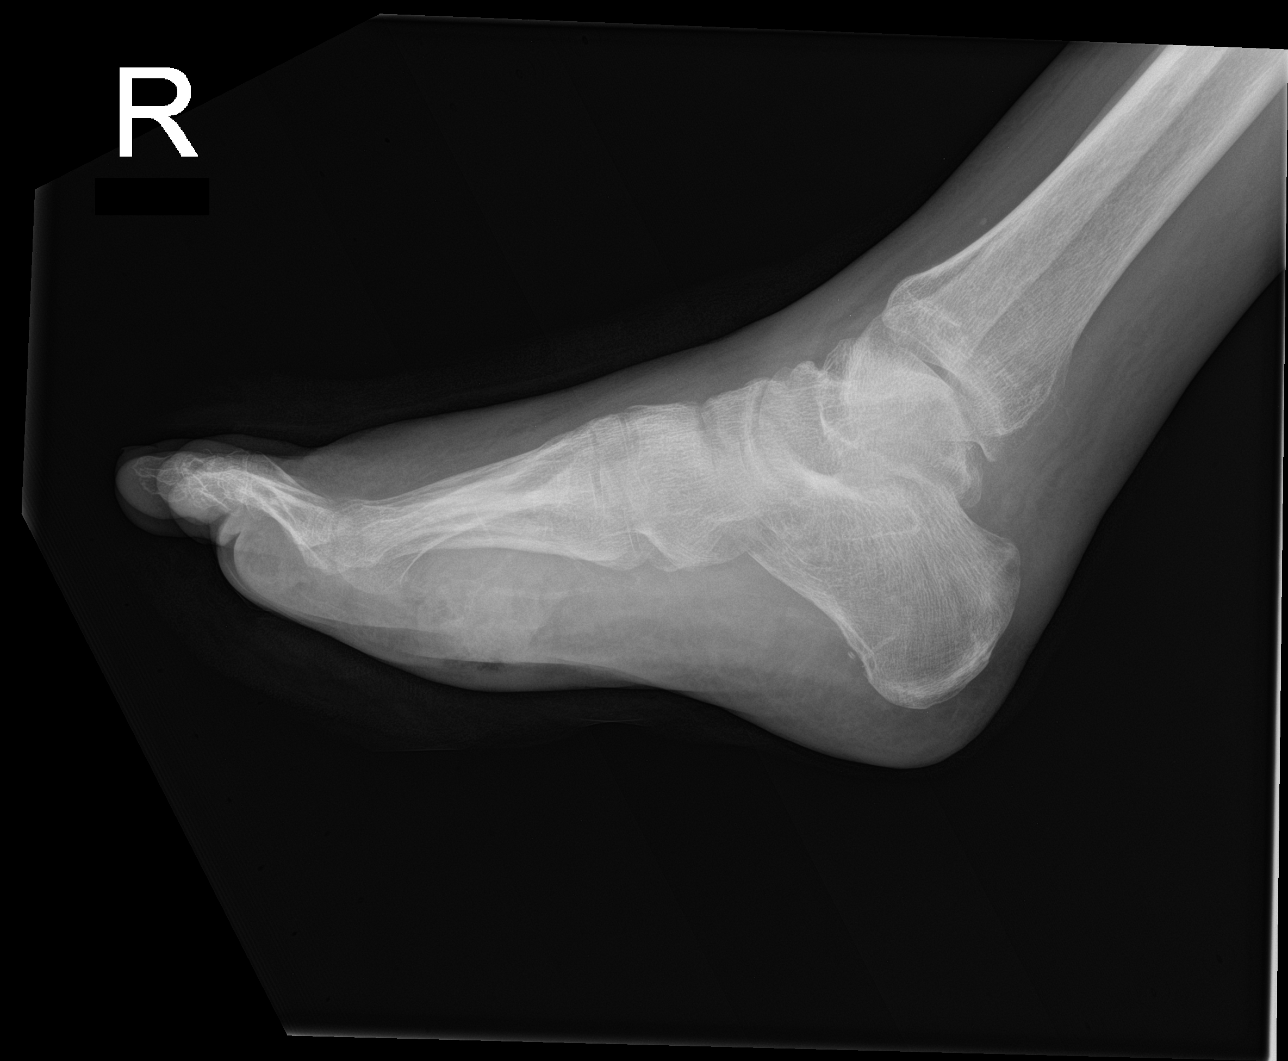

[3 of 3 positions shown; findings below may reference images not displayed]

FINDINGS: Amputation of the first ray with osteotomy at the proximal third of
the first metatarsal. Expected postsurgical change in the medial
LEFT midfoot. Arterial vascular calcifications.
IMPRESSION: First ray amputation at the mid metatarsal without complication.

## 2021-01-12 SURGERY — AMPUTATION, FOOT, RAY
Anesthesia: General | Laterality: Right

## 2021-01-12 MED ORDER — CHLORHEXIDINE GLUCONATE 0.12 % MT SOLN: 15.0000 mL | Freq: Once | OROMUCOSAL | Status: AC

## 2021-01-12 MED ORDER — HYDROMORPHONE HCL 1 MG/ML IJ SOLN: 0.5000 mg | INTRAMUSCULAR | Status: DC | PRN

## 2021-01-12 MED ORDER — BUPIVACAINE HCL (PF) 0.5 % IJ SOLN
INTRAMUSCULAR | Status: DC | PRN
  Administered 2021-01-12: 14 mL

## 2021-01-12 MED ORDER — ONDANSETRON HCL 4 MG/2ML IJ SOLN
INTRAMUSCULAR | Status: DC | PRN
  Administered 2021-01-12: 40 mg via INTRAVENOUS

## 2021-01-12 MED ORDER — PROPOFOL 500 MG/50ML IV EMUL
INTRAVENOUS | Status: DC | PRN
  Administered 2021-01-12: 50 ug/kg/min via INTRAVENOUS

## 2021-01-12 MED ORDER — CHLORHEXIDINE GLUCONATE 0.12 % MT SOLN
OROMUCOSAL | Status: AC
  Administered 2021-01-12: 15 mL via OROMUCOSAL
  Filled 2021-01-12: qty 15

## 2021-01-12 MED ORDER — BUPIVACAINE HCL (PF) 0.5 % IJ SOLN
INTRAMUSCULAR | Status: AC
  Filled 2021-01-12: qty 30

## 2021-01-12 MED ORDER — SODIUM CHLORIDE 0.9 % IV SOLN: INTRAVENOUS | Status: DC

## 2021-01-12 MED ORDER — OXYCODONE HCL 5 MG/5ML PO SOLN: 5.0000 mg | Freq: Once | ORAL | Status: DC | PRN

## 2021-01-12 MED ORDER — FENTANYL CITRATE (PF) 100 MCG/2ML IJ SOLN: 25.0000 ug | INTRAMUSCULAR | Status: DC | PRN

## 2021-01-12 MED ORDER — INSULIN ASPART 100 UNIT/ML ~~LOC~~ SOLN: 0.0000 [IU] | Freq: Three times a day (TID) | SUBCUTANEOUS | Status: DC

## 2021-01-12 MED ORDER — VANCOMYCIN VARIABLE DOSE PER UNSTABLE RENAL FUNCTION (PHARMACIST DOSING): Status: DC

## 2021-01-12 MED ORDER — INSULIN ASPART 100 UNIT/ML ~~LOC~~ SOLN
SUBCUTANEOUS | Status: AC
  Administered 2021-01-12: 5 [IU] via INTRAVENOUS
  Filled 2021-01-12: qty 1

## 2021-01-12 MED ORDER — MIDAZOLAM HCL 2 MG/2ML IJ SOLN
INTRAMUSCULAR | Status: AC
  Filled 2021-01-12: qty 2

## 2021-01-12 MED ORDER — FENTANYL CITRATE (PF) 250 MCG/5ML IJ SOLN
INTRAMUSCULAR | Status: AC
  Filled 2021-01-12: qty 5

## 2021-01-12 MED ORDER — INSULIN ASPART 100 UNIT/ML ~~LOC~~ SOLN: 5.0000 [IU] | Freq: Once | SUBCUTANEOUS | Status: AC

## 2021-01-12 MED ORDER — HYDROCODONE-ACETAMINOPHEN 5-325 MG PO TABS: 1.0000 | ORAL_TABLET | Freq: Four times a day (QID) | ORAL | Status: DC | PRN

## 2021-01-12 MED ORDER — ACETAMINOPHEN 500 MG PO TABS: 1000.0000 mg | ORAL_TABLET | Freq: Once | ORAL | Status: AC

## 2021-01-12 MED ORDER — CHLORHEXIDINE GLUCONATE CLOTH 2 % EX PADS: 6.0000 | MEDICATED_PAD | Freq: Every day | CUTANEOUS | Status: DC

## 2021-01-12 MED ORDER — ONDANSETRON HCL 4 MG/2ML IJ SOLN: 4.0000 mg | Freq: Once | INTRAMUSCULAR | Status: DC | PRN

## 2021-01-12 MED ORDER — 0.9 % SODIUM CHLORIDE (POUR BTL) OPTIME
TOPICAL | Status: DC | PRN
  Administered 2021-01-12: 1000 mL

## 2021-01-12 MED ORDER — GLYCOPYRROLATE PF 0.2 MG/ML IJ SOSY
PREFILLED_SYRINGE | INTRAMUSCULAR | Status: DC | PRN
  Administered 2021-01-12: .2 mg via INTRAVENOUS

## 2021-01-12 MED ORDER — OXYCODONE HCL 5 MG PO TABS: 5.0000 mg | ORAL_TABLET | Freq: Once | ORAL | Status: DC | PRN

## 2021-01-12 MED ORDER — INSULIN ASPART 100 UNIT/ML ~~LOC~~ SOLN
0.0000 [IU] | Freq: Three times a day (TID) | SUBCUTANEOUS | Status: DC
  Administered 2021-01-13: 2 [IU] via SUBCUTANEOUS

## 2021-01-12 MED ORDER — INSULIN DETEMIR 100 UNIT/ML ~~LOC~~ SOLN
5.0000 [IU] | Freq: Every day | SUBCUTANEOUS | Status: DC
  Filled 2021-01-12 (×2): qty 0.05

## 2021-01-12 MED ORDER — SODIUM CHLORIDE 0.9 % IR SOLN
Status: DC | PRN
Start: 2021-01-12 — End: 2021-01-12
  Administered 2021-01-12: 3000 mL

## 2021-01-12 MED ORDER — ACETAMINOPHEN 500 MG PO TABS
ORAL_TABLET | ORAL | Status: AC
  Administered 2021-01-12: 1000 mg via ORAL
  Filled 2021-01-12: qty 2

## 2021-01-12 MED ORDER — PHENYLEPHRINE HCL (PRESSORS) 10 MG/ML IV SOLN
INTRAVENOUS | Status: AC
  Filled 2021-01-12: qty 1

## 2021-01-12 SURGICAL SUPPLY — 36 items
BLADE LONG MED 31X9 (MISCELLANEOUS) IMPLANT
BNDG ELASTIC 4X5.8 VLCR STR LF (GAUZE/BANDAGES/DRESSINGS) ×2 IMPLANT
BNDG GAUZE ELAST 4 BULKY (GAUZE/BANDAGES/DRESSINGS) ×2 IMPLANT
COVER SURGICAL LIGHT HANDLE (MISCELLANEOUS) ×2 IMPLANT
CUFF TOURN SGL QUICK 24 (TOURNIQUET CUFF)
CUFF TRNQT CYL 24X4X16.5-23 (TOURNIQUET CUFF) IMPLANT
DRAPE SURG 17X23 STRL (DRAPES) ×2 IMPLANT
DRSG PAD ABDOMINAL 8X10 ST (GAUZE/BANDAGES/DRESSINGS) ×2 IMPLANT
GAUZE SPONGE 2X2 8PLY STRL LF (GAUZE/BANDAGES/DRESSINGS) IMPLANT
GAUZE SPONGE 4X4 12PLY STRL (GAUZE/BANDAGES/DRESSINGS) ×2 IMPLANT
GAUZE XEROFORM 1X8 LF (GAUZE/BANDAGES/DRESSINGS) IMPLANT
GAUZE XEROFORM 5X9 LF (GAUZE/BANDAGES/DRESSINGS) ×2 IMPLANT
GLOVE BIOGEL M 7.0 STRL (GLOVE) ×2 IMPLANT
GLOVE BIOGEL PI ORTHO PRO 7.5 (GLOVE) ×1
GLOVE PI ORTHO PRO STRL 7.5 (GLOVE) ×1 IMPLANT
GOWN STRL REUS W/ TWL LRG LVL3 (GOWN DISPOSABLE) ×1 IMPLANT
GOWN STRL REUS W/TWL LRG LVL3 (GOWN DISPOSABLE) ×1
KIT BASIN OR (CUSTOM PROCEDURE TRAY) ×2 IMPLANT
KIT TURNOVER KIT B (KITS) ×2 IMPLANT
NEEDLE HYPO 25GX1X1/2 BEV (NEEDLE) IMPLANT
NS IRRIG 1000ML POUR BTL (IV SOLUTION) ×2 IMPLANT
PACK ORTHO EXTREMITY (CUSTOM PROCEDURE TRAY) ×2 IMPLANT
PAD ARMBOARD 7.5X6 YLW CONV (MISCELLANEOUS) ×4 IMPLANT
SOL PREP POV-IOD 4OZ 10% (MISCELLANEOUS) ×2 IMPLANT
SPECIMEN JAR SMALL (MISCELLANEOUS) ×2 IMPLANT
SPONGE GAUZE 2X2 STER 10/PKG (GAUZE/BANDAGES/DRESSINGS)
SUT ETHILON 2 0 FS 18 (SUTURE) ×2 IMPLANT
SUT ETHILON 3 0 PS 1 (SUTURE) ×2 IMPLANT
SUT MNCRL AB 3-0 PS2 27 (SUTURE) ×2 IMPLANT
SUT VIC AB 2-0 FS1 27 (SUTURE) ×2 IMPLANT
SYR CONTROL 10ML LL (SYRINGE) IMPLANT
TOWEL GREEN STERILE (TOWEL DISPOSABLE) ×2 IMPLANT
TOWEL GREEN STERILE FF (TOWEL DISPOSABLE) ×2 IMPLANT
TUBE CONNECTING 12X1/4 (SUCTIONS) IMPLANT
UNDERPAD 30X36 HEAVY ABSORB (UNDERPADS AND DIAPERS) IMPLANT
WATER STERILE IRR 1000ML POUR (IV SOLUTION) ×2 IMPLANT

## 2021-01-12 NOTE — Anesthesia Preprocedure Evaluation (Addendum)
Anesthesia Evaluation  Patient identified by MRN, date of birth, ID band Patient awake    Reviewed: Allergy & Precautions, NPO status , Patient's Chart, lab work & pertinent test results  Airway Mallampati: II  TM Distance: >3 FB Neck ROM: Full    Dental  (+) Teeth Intact, Missing, Dental Advisory Given,    Pulmonary asthma , COPD, Current Smoker,    Pulmonary exam normal breath sounds clear to auscultation       Cardiovascular hypertension, Pt. on medications + Peripheral Vascular Disease  Normal cardiovascular exam+ Valvular Problems/Murmurs (mild MR) MR  Rhythm:Regular Rate:Normal  Echo 06/2020: 1. Left ventricular ejection fraction, by estimation, is 55 to 60%. The  left ventricle has normal function. The left ventricle has no regional  wall motion abnormalities. Left ventricular diastolic parameters are  indeterminate.  2. Right ventricular systolic function is normal. The right ventricular  size is normal.  3. Left atrial size was severely dilated.  4. The mitral valve is normal in structure. Mild mitral valve  regurgitation. No evidence of mitral stenosis.  5. The aortic valve is tricuspid. Aortic valve regurgitation is not  visualized. No aortic stenosis is present.  6. The inferior vena cava is normal in size with greater than 50%  respiratory variability, suggesting right atrial pressure of 3 mmHg.   Neuro/Psych negative neurological ROS  negative psych ROS   GI/Hepatic GERD  Medicated and Controlled,(+) Hepatitis -  Endo/Other  diabetes, Poorly Controlled, Type 2, Insulin Dependenta1c 7.5  Renal/GU CRF and Renal InsufficiencyRenal diseaseCr 3.39, CKD  Last HD yesterday   negative genitourinary   Musculoskeletal Cellulitis RLE   Abdominal   Peds  Hematology  (+) Blood dyscrasia, anemia , H/H 9.4/26.6   Anesthesia Other Findings Sepsis 2/2 osteo R great toe- febrile and hypotensive to SBP 60s in  the ED on admission yesterday   Reproductive/Obstetrics negative OB ROS                           Anesthesia Physical Anesthesia Plan  ASA: IV  Anesthesia Plan: General   Post-op Pain Management:    Induction:   PONV Risk Score and Plan: 2 and Ondansetron, Dexamethasone and Treatment may vary due to age or medical condition  Airway Management Planned: LMA  Additional Equipment: None  Intra-op Plan:   Post-operative Plan: Extubation in OR  Informed Consent: I have reviewed the patients History and Physical, chart, labs and discussed the procedure including the risks, benefits and alternatives for the proposed anesthesia with the patient or authorized representative who has indicated his/her understanding and acceptance.     Dental advisory given  Plan Discussed with: CRNA  Anesthesia Plan Comments: (Pt states he has limited sensation in the affected toe )       Anesthesia Quick Evaluation

## 2021-01-12 NOTE — ED Notes (Signed)
Pt self administered 40mg  Omprezaole PO.

## 2021-01-12 NOTE — Consult Note (Signed)
[facility] Renal Consultation Note    Indication for Consultation:  Management of ESRD/hemodialysis; anemia, hypertension/volume and secondary hyperparathyroidism  UMP:NTIRWE, [name], PA  HPI: Duane Hall is a 65 y.o. male. ESRD on home hemodialysis.  Past medical history significant for DM on insulin pump, COPD, claudication, tobaccos abuse, hypokalemia and PVD s/p recent RLE revascularization. Patient reports compliance with prescribed dialysis regimen.  Usually runs on Sunday, Monday, Wednesday and Thursday.  Last dialysis yesterday.   Patient presented to the ED due to worsened darkening of R great toe.  States he has had slowing worsening discoloration for a few weeks that increased over the last few days.  Has been followed by podiatry.  Admits to fever, nausea and vomiting starting yesterday so decided he needed to be seen. Denies CP, SOB, orthopnea, abdominal pain, edema, weakness and fatigue.  Otherwise he has been feeling pretty well.  Tolerating dialysis well.   Pertinent findings in the ED include hypotension, fever 103, CXR with no acute cardiopulmonary disease, negative COVID, necrotic R great toe with x ray showing possible osteomyelitis.  Patient has been admitted for further evaluation and management.   Past Medical History:  Diagnosis Date  . Asthma   . Cancer ([place])   . Cataract   . CKD (chronic kidney disease), stage III ([place])   . Diabetes mellitus   . Glaucoma   . Vascular insufficiency 05/2020   Past Surgical History:  Procedure Laterality Date  . ABDOMINAL AORTOGRAM W/LOWER EXTREMITY N/A 06/19/2020   Procedure: ABDOMINAL AORTOGRAM W/LOWER EXTREMITY;  Surgeon: Waynetta Sandy, MD;  Location: [place] [facility];  Service: Cardiovascular;  Laterality: N/A;  . ABDOMINAL AORTOGRAM W/LOWER EXTREMITY Left 10/16/2020   Procedure: ABDOMINAL AORTOGRAM W/LOWER EXTREMITY;  Surgeon: Waynetta Sandy, MD;  Location: [place] [facility];   Service: Cardiovascular;  Laterality: Left;  . ABDOMINAL AORTOGRAM W/LOWER EXTREMITY N/A 12/25/2020   Procedure: ABDOMINAL AORTOGRAM W/LOWER EXTREMITY;  Surgeon: Waynetta Sandy, MD;  Location: [facility];  Service: Cardiovascular;  Laterality: N/A;  . AV FISTULA PLACEMENT Right 09/14/2019   Procedure: Right Arm Basilic Vein transposition;  Surgeon: Angelia Mould, MD;  Location: [place];  Service: Vascular;  Laterality: Right;  . BONE BIOPSY Left 07/29/2020   Procedure: BONE BIOPSY;  Surgeon: [name], DPM;  Location: [place] [facility];  Service: Podiatry;  Laterality: Left;  Need bone trephines and/or large bore Giamshidi  . COLONOSCOPY    . PERIPHERAL VASCULAR ATHERECTOMY Left 06/19/2020   Procedure: PERIPHERAL VASCULAR ATHERECTOMY;  Surgeon: Waynetta Sandy, MD;  Location: [facility];  Service: Cardiovascular;  Laterality: Left;  SFA  . PERIPHERAL VASCULAR ATHERECTOMY  12/25/2020   Procedure: PERIPHERAL VASCULAR ATHERECTOMY;  Surgeon: Waynetta Sandy, MD;  Location: [facility];  Service: Cardiovascular;;  Lt. PT - Laser Lt. SFA - Laser  . PERIPHERAL VASCULAR BALLOON ANGIOPLASTY  12/25/2020   Procedure: PERIPHERAL VASCULAR BALLOON ANGIOPLASTY;  Surgeon: Waynetta Sandy, MD;  Location: [place] [facility];  Service: Cardiovascular;;  Lt. SFA and PT  . UPPER GASTROINTESTINAL ENDOSCOPY  02/2019   Dr Benson Norway     Family History  Problem Relation Age of Onset  . Cancer Father   . Diabetes Mother    Social History:  reports that he has been smoking cigarettes. He has been smoking about 0.30 packs per day. He has never used smokeless tobacco. He reports that he does not drink alcohol and does not use  drugs. Allergies  Allergen Reactions  . Augmentin [Amoxicillin-Pot Clavulanate] Diarrhea  . Midodrine Other (See Comments)    Other reaction(s): Urinary Sensation  . Tape Other (See Comments)    bandaids cause blistering   Prior to  Admission medications   Medication Sig Start Date End Date Taking? Authorizing Provider  aspirin EC 81 MG EC tablet Take 1 tablet (81 mg total) by mouth daily. Swallow whole. 06/21/20   Charlynne Cousins, MD  clopidogrel (PLAVIX) 75 MG tablet Take 1 tablet (75 mg total) by mouth daily with breakfast. 06/21/20   Charlynne Cousins, MD  collagenase (SANTYL) ointment Apply 1 application topically See admin instructions. Apply to the left heel daily with a layer the thickness of a nickel 12/25/20   McDonald, Adam R, DPM  Continuous Blood Gluc Sensor (FREESTYLE LIBRE 14 DAY SENSOR) MISC 1 Device by Other route every 14 (fourteen) days. 12/12/20   Renato Shin, MD  ferric citrate (AURYXIA) 1 GM 210 MG(Fe) tablet Take 210 mg by mouth in the morning and at bedtime.     [provider]  gabapentin (NEURONTIN) 100 MG capsule Take 100 mg by mouth 3 (three) times daily. 10/21/20   [provider]  GVOKE HYPOPEN 1-PACK 1 MG/0.2ML SOAJ Inject 1 mg into the skin daily as needed (low blood sugar). 09/20/20   [provider]  insulin aspart (NOVOLOG FLEXPEN) 100 UNIT/ML FlexPen 3 times a day (just before each meal) 07-05-13 units. Patient taking differently: Inject 7-14 Units into the skin See admin instructions. 3 times a day (just before each meal) 7-units in the morning, 9-units at lunch and 14 unit at dinner 11/10/20   Renato Shin, MD  Insulin NPH, Human,, Isophane, (NOVOLIN N FLEXPEN) 100 UNIT/ML Kiwkpen Inject 2 Units into the skin at bedtime. 12/12/20   Renato Shin, MD  Insulin Pen Needle (PEN NEEDLES) 31G X 6 MM MISC 1 Device by Does not apply route in the morning, at noon, in the evening, and at bedtime. 12/12/20   Renato Shin, MD  lidocaine-prilocaine (EMLA) cream Apply 1 application topically See admin instructions. Apply topically to port access one hour prior to dialysis on Sunday, Monday, Wednesday, Thursday 05/12/20   [provider]  Methoxy PEG-Epoetin Beta  (MIRCERA IJ) Inject into the skin every 30 (thirty) days. 07/03/20   [provider]  multivitamin (RENA-VIT) TABS tablet Take 1 tablet by mouth daily.    [provider]  mupirocin ointment (BACTROBAN) 2 % Apply 1 application topically 2 (two) times daily. 11/30/20   McDonald, Stephan Minister, DPM  omeprazole (PRILOSEC) 40 MG capsule Take 40 mg by mouth daily.    [provider]  rosuvastatin (CRESTOR) 10 MG tablet TAKE 1 TABLET BY MOUTH DAILY Patient taking differently: Take 10 mg by mouth daily. 08/17/20   Waynetta Sandy, MD  sennosides-docusate sodium (SENOKOT-S) 8.6-50 MG tablet Take 1 tablet by mouth at bedtime.    [provider]   Current Facility-Administered Medications  Medication Dose Route Frequency Provider Last Rate Last Admin  . 0.9 %  sodium chloride infusion   Intravenous Continuous Effie Berkshire, MD      . Doug Sou Hold] acetaminophen (TYLENOL) tablet 650 mg  650 mg Oral Q6H PRN Rise Patience, MD       Or  . Doug Sou Hold] acetaminophen (TYLENOL) suppository 650 mg  650 mg Rectal Q6H PRN Rise Patience, MD      . Doug Sou Hold] ceFEPIme (MAXIPIME) 1 g in  sodium chloride 0.9 % 100 mL IVPB  1 g Intravenous Q24H Rise Patience, MD   Stopped at 01/11/21 2305  . chlorhexidine (PERIDEX) 0.12 % solution 15 mL  15 mL Mouth/Throat Once Effie Berkshire, MD      . chlorhexidine (PERIDEX) 0.12 % solution           . [MAR Hold] collagenase (SANTYL) ointment   Topical Daily Rise Patience, MD   1 application at 53/97/67 [phone]  . [MAR Hold] ferric citrate (AURYXIA) tablet 210 mg  210 mg Oral TID WC Rise Patience, MD      . Doug Sou Hold] gabapentin (NEURONTIN) capsule 100 mg  100 mg Oral TID Rise Patience, MD      . HYDROcodone-acetaminophen (NORCO/VICODIN) 5-325 MG per tablet 1 tablet  1 tablet Oral Q6H PRN Regalado, Belkys A, MD      . HYDROmorphone (DILAUDID) injection 0.5 mg  0.5 mg Intravenous Q4H PRN Regalado, Belkys A, MD       . Doug Sou Hold] multivitamin (RENA-VIT) tablet 1 tablet  1 tablet Oral Daily Rise Patience, MD      . Doug Sou Hold] pantoprazole (PROTONIX) EC tablet 40 mg  40 mg Oral Daily Rise Patience, MD      . Doug Sou Hold] rosuvastatin (CRESTOR) tablet 10 mg  10 mg Oral QHS Rise Patience, MD      . Doug Sou Hold] senna-docusate (Senokot-S) tablet 1 tablet  1 tablet Oral QHS Rise Patience, MD      . Doug Sou Hold] vancomycin variable dose per unstable renal function (pharmacist dosing)   Does not apply See admin instructions von Caren Griffins, [facility]       Labs: Basic Metabolic Panel: Recent Labs  Lab 01/11/21 1822  NA 131*  K 3.1*  CL 89*  CO2 30  GLUCOSE 303*  BUN 11  CREATININE 3.39*  CALCIUM 9.0   Liver Function Tests: Recent Labs  Lab 01/11/21 1822  AST 22  ALT 22  ALKPHOS 124  BILITOT 0.5  PROT 6.4*  ALBUMIN 3.1*   CBC: Recent Labs  Lab 01/11/21 1822  WBC 13.1*  NEUTROABS 11.5*  HGB 9.4*  HCT 26.6*  MCV 92.0  PLT 219   CBG: Recent Labs  Lab 01/11/21 1817 01/12/21 1238  GLUCAP 289* 273*   Studies/Results: DG Os Calcis Left  Result Date: 01/11/2021 CLINICAL DATA:  Pressure wound to the left heel. Evaluate for osteomyelitis. EXAM: LEFT OS CALCIS - 2+ VIEW COMPARISON:  None. FINDINGS: No fracture or bone lesion. There is no bone resorption to suggest osteomyelitis. Small dorsal calcaneal spur. Soft tissue swelling is seen across the heel with no soft tissue air. IMPRESSION: 1. Soft tissue swelling, but no evidence of osteomyelitis. No soft tissue air. Electronically Signed   By: Lajean Manes M.D.   On: 01/11/2021 19:44   DG Chest Port 1 View  Result Date: 01/11/2021 CLINICAL DATA:  Questionable sepsis. Necrotic great toe a cellulitis. EXAM: PORTABLE CHEST 1 VIEW COMPARISON:  07/26/2020 FINDINGS: Cardiac silhouette is normal in size. No mediastinal or hilar masses. No evidence of adenopathy. Clear lungs.  No pleural effusion or pneumothorax. Skeletal  structures are grossly intact. IMPRESSION: No active disease. Electronically Signed   By: Lajean Manes M.D.   On: 01/11/2021 19:38   DG Foot Complete Right  Result Date: 01/11/2021 CLINICAL DATA:  Necrotic great toe with cellulitis. Assess for osteomyelitis. EXAM: RIGHT FOOT COMPLETE - 3+ VIEW COMPARISON:  None.  FINDINGS: Subtle loss of the white cortical line along the medial margin of the mid to distal great toe proximal phalanx and at the medial base of the great toe distal phalanx, suspicious for osteomyelitis. There is overlying soft tissue swelling and soft tissue air. No other evidence of osteomyelitis.  No fracture or bone lesion. Joints are normally spaced and aligned. Soft tissue swelling and air extends to the medial aspect of the forefoot adjacent to the first metatarsophalangeal joint. There is additional forefoot soft tissue swelling with no additional soft tissue air. Vascular calcifications extend from the ankle to the digital arteries. IMPRESSION: 1. Subtle evidence of osteomyelitis along the medial aspect of the proximal phalanx of the great toe and somewhat more quickly at the medial base of the distal phalanx of the great toe. No other evidence of osteomyelitis. No fracture or bone lesion. 2. Soft tissue swelling with soft tissue air that extends from the great toe 2 adjacent to the first metatarsophalangeal joint. Electronically Signed   By: Lajean Manes M.D.   On: 01/11/2021 19:43    ROS: All others negative except those listed in HPI.   Physical Exam: Vitals:   01/12/21 0602 01/12/21 0709 01/12/21 0918 01/12/21 1223  BP: 134/72  (!) 125/58 (!) 92/59  Pulse: 73  81 88  Resp: 11  10 16   Temp:  98.4 F (36.9 C)  98.7 F (37.1 C)  TempSrc:  Oral  Oral  SpO2: 99%  98% 98%  Weight:      Height:         General: chronically ill appearing male in NAD Head: NCAT sclera not icteric MMM Neck: Supple.  Lungs: CTA bilaterally. No wheeze, rales or rhonchi. Breathing is  unlabored. Heart: RRR. No murmur, rubs or gallops.  Abdomen: soft, nontender, +BS, no guarding, no rebound tenderness Lower extremities:trace edema, R great toe necrotic Neuro: AAOx3. Moves all extremities spontaneously. Psych:  Flat affect Dialysis Access: RU AVF +b/t  Dialysis Orders:  Home Hemo SMWTh 2K BFR 400 Hep 3000, edw 76kg  Venofer 200 mircera 30subq last 11/18  Assessment/Plan: 1.  Necrotic R great toe w/osteomyelitis - Hx PVD with revascularization on 12/27.  plan for toe amputation today. ABC per primary 2. Fever - likely 2/2 #1.  Blood cultures ordered 3.  ESRD -  On home hemo.  Plan for TTS schedule while admitted.  Orders written for HD tomorrow using ^K bath. K 3.1 4.  Hypertension/volume  - BP variable but mostly hypotensive.  Does not appear volume overloaded. CXR with no acute findings.  Minimal UF with HD tomorrow. 5.  Anemia of CKD - Hgb 9.4.  Order for aranesp with HD tomorrow.  6.  Secondary Hyperparathyroidism -  Ca in goal. Check phos.  Continue binders.  7.  Nutrition - Renal diet w/fluid restrictions.  8. DM - on insulin. Per primary  Jen Mow, PA-C [facility] 01/12/2021, 12:52 PM

## 2021-01-12 NOTE — Progress Notes (Signed)
PROGRESS NOTE    Duane Hall  QZE:[id number] DOB: 1956-07-18 DOA: 01/11/2021 PCP: Pieter Partridge, PA   Brief Narrative: 65 year old with past medical history significant for ESRD on hemodialysis at home, diabetes on insulin pump, chronic anemia, peripheral vascular disease status post recent procedure December 25, 2020 by Dr. Donzetta Matters patient underwent laser atherectomy and balloon angioplasty of the right lower extremity arteries, has been noticed to have increasing discoloration of the right great toe over the last few weeks and is being acutely worsening last few days.  Patient is followed by podiatry Dr. Lynann Bologna.  Today after dialysis patient was noted to have fever and was brought to the ER.  In the emergency room patient was noted to be hypotensive with blood pressure systolic in the 67 temperature of 103.  Chest x-ray unremarkable.  COVID test negative.  X-ray of the right foot via possible osteomyelitis of the right great toe and also patient's right great toe looks necrotic.  There is swelling of the right foot as well. Pulses are dopplerable.    Assessment & Plan:   Principal Problem:   Sepsis ([place]) Active Problems:   Type II diabetes mellitus ([place])   Hypertension   Anemia of chronic disease   End-stage renal disease on hemodialysis (HCC)   Cellulitis of right foot  1-Sepsis; secondary to right foot, great toe infection, necrosis.  Patient presents with Fever, Tachycardia, leukocytosis, tachypnea hypotension.  Continue with Vancomycin and ceftriaxone.  For surgery today by Dr Sherryle Lis.  Order Vicodin/dilaudid PRN for pain controlled.   2-Diabetes Mellitus; Plan to discontinue insulin Pump.  Start Levemir and SSI.  Depending on reading and if he is eating will add meals coverage.   3-PVD; holding aspirin and plavix in anticipation to surgery.  Underwent laser atherectomy and ballon angioplasty by Dr Donzetta Matters 2021.  4-ESRD on HD at home; He gets HD at home 4 times a  week. Nephrology consulted.  Will plan to get labs during HD. Discussed with nephrology team.   5-Chronic Anemia from Chronic kidney disease. Follow trend.  6-Hypokalemia;  Repeat lbs tomorrow.   See wound care note documentation  Pressure Injury 06/19/20 Heel Left Unstageable - Full thickness tissue loss in which the base of the injury is covered by slough (yellow, tan, gray, green or brown) and/or eschar (tan, brown or black) in the wound bed. eschar  (Active)  06/19/20 2010  Location: Heel  Location Orientation: Left  Staging: Unstageable - Full thickness tissue loss in which the base of the injury is covered by slough (yellow, tan, gray, green or brown) and/or eschar (tan, brown or black) in the wound bed.  Wound Description (Comments): eschar   Present on Admission: Yes     Pressure Injury 06/19/20 Toe (Comment  which one) Bilateral;Right;Left Stage 2 -  Partial thickness loss of dermis presenting as a shallow open injury with a red, pink wound bed without slough. scab formed on both toes (Active)  06/19/20 2010  Location: Toe (Comment  which one)  Location Orientation: Bilateral;Right;Left  Staging: Stage 2 -  Partial thickness loss of dermis presenting as a shallow open injury with a red, pink wound bed without slough.  Wound Description (Comments): scab formed on both toes  Present on Admission: Yes     Pressure Injury 06/19/20 Heel Right Stage 2 -  Partial thickness loss of dermis presenting as a shallow open injury with a red, pink wound bed without slough. skin tear (Active)  06/19/20 2010  Location:  Heel  Location Orientation: Right  Staging: Stage 2 -  Partial thickness loss of dermis presenting as a shallow open injury with a red, pink wound bed without slough.  Wound Description (Comments): skin tear  Present on Admission: Yes                  Estimated body mass index is 25.1 kg/m as calculated from the following:   Height as of this encounter: 5\' 9"   (1.753 m).   Weight as of this encounter: 77.1 kg.   DVT prophylaxis: scd Code Status: Full code Family Communication: Wife over phone Disposition Plan:  Status is: Inpatient  Not inpatient appropriate, will call UM team and downgrade to OBS.   Dispo: The patient is from: Home              Anticipated d/c is to: Home              Anticipated d/c date is: 2 days              Patient currently is not medically stable to d/c.        Consultants:   Nephrology  Podiatry  Procedures:     Antimicrobials:  Vancomycin  Ceftriaxone  Subjective: He is alert. He wants to know why he is NPO. I explain to him, in anticipation of possible sx of his foot.    Objective: Vitals:   01/12/21 0230 01/12/21 0300 01/12/21 0602 01/12/21 0709  BP: (!) 126/55 (!) 146/72 134/72   Pulse: 70 70 73   Resp: 11 14 11    Temp:    98.4 F (36.9 C)  TempSrc:    Oral  SpO2: 96% 100% 99%   Weight:      Height:        Intake/Output Summary (Last 24 hours) at 01/12/2021 0737 Last data filed at 01/11/2021 2305 Gross per 24 hour  Intake 494.29 ml  Output -  Net 494.29 ml   Filed Weights   01/11/21 1807  Weight: 77.1 kg    Examination:  General exam: Appears calm and comfortable  Respiratory system: Clear to auscultation. Respiratory effort normal. Cardiovascular system: S1 & S2 heard, RRR. No JVD, murmurs, rubs, gallops or clicks. No pedal edema. Gastrointestinal system: Abdomen is nondistended, soft and nontender. No organomegaly or masses felt. Normal bowel sounds heard. Central nervous system: Alert and oriented. No focal neurological deficits. Extremities: Symmetric 5 x 5 power. Right toe with dressing, appears necrotic. Patient didn't allo me to remove dressing.    Data Reviewed: I have personally reviewed following labs and imaging studies  CBC: Recent Labs  Lab 01/11/21 1822  WBC 13.1*  NEUTROABS 11.5*  HGB 9.4*  HCT 26.6*  MCV 92.0  PLT 258   Basic Metabolic  Panel: Recent Labs  Lab 01/11/21 1822  NA 131*  K 3.1*  CL 89*  CO2 30  GLUCOSE 303*  BUN 11  CREATININE 3.39*  CALCIUM 9.0   GFR: Estimated Creatinine Clearance: 22.6 mL/min (A) (by C-G formula based on SCr of 3.39 mg/dL (H)). Liver Function Tests: Recent Labs  Lab 01/11/21 1822  AST 22  ALT 22  ALKPHOS 124  BILITOT 0.5  PROT 6.4*  ALBUMIN 3.1*   No results for input(s): LIPASE, AMYLASE in the last 168 hours. No results for input(s): AMMONIA in the last 168 hours. Coagulation Profile: Recent Labs  Lab 01/11/21 1822  INR 1.1   Cardiac Enzymes: No results for input(s): CKTOTAL, CKMB, CKMBINDEX,  TROPONINI in the last 168 hours. BNP (last 3 results) No results for input(s): PROBNP in the last 8760 hours. HbA1C: Recent Labs    01/11/21 2151  HGBA1C 7.5*   CBG: Recent Labs  Lab 01/11/21 1817  GLUCAP 289*   Lipid Profile: No results for input(s): CHOL, HDL, LDLCALC, TRIG, CHOLHDL, LDLDIRECT in the last 72 hours. Thyroid Function Tests: No results for input(s): TSH, T4TOTAL, FREET4, T3FREE, THYROIDAB in the last 72 hours. Anemia Panel: No results for input(s): VITAMINB12, FOLATE, FERRITIN, TIBC, IRON, RETICCTPCT in the last 72 hours. Sepsis Labs: Recent Labs  Lab 01/11/21 1822 01/11/21 1853 01/11/21 2151  LATICACIDVEN 1.6 1.5 1.0    Recent Results (from the past 240 hour(s))  Culture, blood (Routine x 2)     Status: None (Preliminary result)   Collection Time: 01/11/21  6:20 PM   Specimen: BLOOD RIGHT ARM  Result Value Ref Range Status   Specimen Description BLOOD RIGHT ARM UPPER  Final   Special Requests   Final    BOTTLES DRAWN AEROBIC AND ANAEROBIC Blood Culture adequate volume   Culture   Final    NO GROWTH < 12 HOURS Performed at [facility], [place] [address], [place], [place] [address]    Report Status PENDING  Incomplete  Culture, blood (Routine x 2)     Status: None (Preliminary result)   Collection Time: 01/11/21  6:55 PM    Specimen: BLOOD  Result Value Ref Range Status   Specimen Description BLOOD BLOOD LEFT FOREARM  Final   Special Requests   Final    BOTTLES DRAWN AEROBIC AND ANAEROBIC Blood Culture results may not be optimal due to an excessive volume of blood received in culture bottles   Culture   Final    NO GROWTH < 12 HOURS Performed at [facility], [place] [address] [place], [place] [address]    Report Status PENDING  Incomplete  Resp Panel by RT-PCR (Flu A&B, Covid) Nasopharyngeal Swab     Status: None   Collection Time: 01/11/21  8:14 PM   Specimen: Nasopharyngeal Swab; Nasopharyngeal(NP) swabs in vial transport medium  Result Value Ref Range Status   SARS Coronavirus 2 by RT PCR NEGATIVE NEGATIVE Final    Comment: (NOTE) SARS-CoV-2 target nucleic acids are NOT DETECTED.  The SARS-CoV-2 RNA is generally detectable in upper respiratory specimens during the acute phase of infection. The lowest concentration of SARS-CoV-2 viral copies this assay can detect is 138 copies/mL. A negative result does not preclude SARS-Cov-2 infection and should not be used as the sole basis for treatment or other patient management decisions. A negative result may occur with  improper specimen collection/handling, submission of specimen other than nasopharyngeal swab, presence of viral mutation(s) within the areas targeted by this assay, and inadequate number of viral copies(<138 copies/mL). A negative result must be combined with clinical observations, patient history, and epidemiological information. The expected result is Negative.  Fact Sheet for Patients:  EntrepreneurPulse.com.au  Fact Sheet for Healthcare Providers:  IncredibleEmployment.be  This test is no t yet approved or cleared by the [place] FDA and  has been authorized for detection and/or diagnosis of SARS-CoV-2 by FDA under an Emergency Use Authorization (EUA). This EUA will remain  in effect  (meaning this test can be used) for the duration of the COVID-19 declaration under Section 564(b)(1) of the Act, 21 U.S.C.section 360bbb-3(b)(1), unless the authorization is terminated  or revoked sooner.       Influenza  A by PCR NEGATIVE NEGATIVE Final   Influenza B by PCR NEGATIVE NEGATIVE Final    Comment: (NOTE) The Xpert Xpress SARS-CoV-2/FLU/RSV plus assay is intended as an aid in the diagnosis of influenza from Nasopharyngeal swab specimens and should not be used as a sole basis for treatment. Nasal washings and aspirates are unacceptable for Xpert Xpress SARS-CoV-2/FLU/RSV testing.  Fact Sheet for Patients: EntrepreneurPulse.com.au  Fact Sheet for Healthcare Providers: IncredibleEmployment.be  This test is not yet approved or cleared by the [place] FDA and has been authorized for detection and/or diagnosis of SARS-CoV-2 by FDA under an Emergency Use Authorization (EUA). This EUA will remain in effect (meaning this test can be used) for the duration of the COVID-19 declaration under Section 564(b)(1) of the Act, 21 U.S.C. section 360bbb-3(b)(1), unless the authorization is terminated or revoked.  Performed at [facility], [place] [address] [place], [place] [address]          Radiology Studies: DG Os Calcis Left  Result Date: 01/11/2021 CLINICAL DATA:  Pressure wound to the left heel. Evaluate for osteomyelitis. EXAM: LEFT OS CALCIS - 2+ VIEW COMPARISON:  None. FINDINGS: No fracture or bone lesion. There is no bone resorption to suggest osteomyelitis. Small dorsal calcaneal spur. Soft tissue swelling is seen across the heel with no soft tissue air. IMPRESSION: 1. Soft tissue swelling, but no evidence of osteomyelitis. No soft tissue air. Electronically Signed   By: Lajean Manes M.D.   On: 01/11/2021 19:44   DG Chest Port 1 View  Result Date: 01/11/2021 CLINICAL DATA:  Questionable sepsis. Necrotic great toe a  cellulitis. EXAM: PORTABLE CHEST 1 VIEW COMPARISON:  07/26/2020 FINDINGS: Cardiac silhouette is normal in size. No mediastinal or hilar masses. No evidence of adenopathy. Clear lungs.  No pleural effusion or pneumothorax. Skeletal structures are grossly intact. IMPRESSION: No active disease. Electronically Signed   By: Lajean Manes M.D.   On: 01/11/2021 19:38   DG Foot Complete Right  Result Date: 01/11/2021 CLINICAL DATA:  Necrotic great toe with cellulitis. Assess for osteomyelitis. EXAM: RIGHT FOOT COMPLETE - 3+ VIEW COMPARISON:  None. FINDINGS: Subtle loss of the white cortical line along the medial margin of the mid to distal great toe proximal phalanx and at the medial base of the great toe distal phalanx, suspicious for osteomyelitis. There is overlying soft tissue swelling and soft tissue air. No other evidence of osteomyelitis.  No fracture or bone lesion. Joints are normally spaced and aligned. Soft tissue swelling and air extends to the medial aspect of the forefoot adjacent to the first metatarsophalangeal joint. There is additional forefoot soft tissue swelling with no additional soft tissue air. Vascular calcifications extend from the ankle to the digital arteries. IMPRESSION: 1. Subtle evidence of osteomyelitis along the medial aspect of the proximal phalanx of the great toe and somewhat more quickly at the medial base of the distal phalanx of the great toe. No other evidence of osteomyelitis. No fracture or bone lesion. 2. Soft tissue swelling with soft tissue air that extends from the great toe 2 adjacent to the first metatarsophalangeal joint. Electronically Signed   By: Lajean Manes M.D.   On: 01/11/2021 19:43        Scheduled Meds: . collagenase   Topical Daily  . ferric citrate  210 mg Oral TID WC  . gabapentin  100 mg Oral TID  . insulin pump   Subcutaneous Q4H  . multivitamin  1 tablet Oral Daily  . pantoprazole  40 mg Oral Daily  . rosuvastatin  10 mg Oral QHS  .  senna-docusate  1 tablet Oral QHS  . sodium chloride flush  3 mL Intravenous Q12H  . sodium chloride flush  3 mL Intravenous Q12H   Continuous Infusions: . sodium chloride    . sodium chloride    . ceFEPime (MAXIPIME) IV Stopped (01/11/21 2305)  . [START ON 01/14/2021] vancomycin       LOS: 1 day    Time spent: 35 minutes.     Elmarie Shiley, MD [facility]   If 7PM-7AM, please contact night-coverage [email or web address]  01/12/2021, 7:37 AM

## 2021-01-12 NOTE — Plan of Care (Signed)

## 2021-01-12 NOTE — Transfer of Care (Signed)
Immediate Anesthesia Transfer of Care Note  Patient: Duane Hall  Procedure(s) Performed: 1ST AMPUTATION RAY (Right )  Patient Location: PACU  Anesthesia Type:MAC and Regional  Level of Consciousness: drowsy and patient cooperative  Airway & Oxygen Therapy: Patient Spontanous Breathing  Post-op Assessment: Report given to RN and Post -op Vital signs reviewed and stable  Post vital signs: Reviewed and stable  Last Vitals:  Vitals Value Taken Time  BP 102/39 01/12/21 1515  Temp    Pulse 77 01/12/21 1515  Resp 14 01/12/21 1515  SpO2 98 % 01/12/21 1515  Vitals shown include unvalidated device data.  Last Pain:  Vitals:   01/12/21 1223  TempSrc: Oral  PainSc:          Complications: No complications documented.

## 2021-01-12 NOTE — Anesthesia Procedure Notes (Signed)
Procedure Name: MAC Date/Time: 01/12/2021 2:00 PM Performed by: Lowella Dell, CRNA Pre-anesthesia Checklist: Patient identified, Emergency Drugs available, Suction available, Patient being monitored and Timeout performed Patient Re-evaluated:Patient Re-evaluated prior to induction Oxygen Delivery Method: Simple face mask Placement Confirmation: positive ETCO2 Dental Injury: Teeth and Oropharynx as per pre-operative assessment

## 2021-01-12 NOTE — ED Notes (Signed)
Pt's CBG is 299 via patient's personal implanted blood glucose monitor.

## 2021-01-12 NOTE — Anesthesia Postprocedure Evaluation (Signed)
Anesthesia Post Note  Patient: Duane Hall [facility]  Procedure(s) Performed: 1ST AMPUTATION RAY (Right )     Patient location during evaluation: PACU Anesthesia Type: MAC Level of consciousness: awake and alert, oriented and patient cooperative Pain management: pain level controlled Vital Signs Assessment: post-procedure vital signs reviewed and stable Respiratory status: spontaneous breathing, nonlabored ventilation and respiratory function stable Cardiovascular status: blood pressure returned to baseline and stable Postop Assessment: no apparent nausea or vomiting Anesthetic complications: no   No complications documented.  Last Vitals:  Vitals:   01/12/21 1540 01/12/21 1600  BP: (!) 98/55 133/89  Pulse:  89  Resp:  11  Temp:  37.3 C  SpO2:  98%    Last Pain:  Vitals:   01/12/21 1600  TempSrc: Oral  PainSc: 0-No pain                 Pervis Hocking

## 2021-01-12 NOTE — ED Notes (Signed)
Pt's CBG 226 on personal implanted insulin pump.

## 2021-01-12 NOTE — Brief Op Note (Signed)
01/12/2021  3:08 PM  PATIENT:  Duane Hall  65 y.o. male  PRE-OPERATIVE DIAGNOSIS:  Cellulitis Right Foot  POST-OPERATIVE DIAGNOSIS:  Cellulitis Right Foot  PROCEDURE:  Procedure(s): 1ST AMPUTATION RAY (Right)  SURGEON:  Surgeon(s) and Role:    * Hyun Reali, Stephan Minister, DPM - Primary  PHYSICIAN ASSISTANT:   ASSISTANTS: none   ANESTHESIA:   MAC  EBL:  50 mL   BLOOD ADMINISTERED:none  DRAINS: none   LOCAL MEDICATIONS USED:  MARCAINE   14cc  SPECIMEN:  First ray right foot  DISPOSITION OF SPECIMEN:  Histopathology and micro tissue culture  COUNTS:  YES  TOURNIQUET:  none  DICTATION: .Note written in EPIC  PLAN OF CARE: Admit to inpatient   PATIENT DISPOSITION:  PACU - hemodynamically stable. Limb salvage prospects guarded   Delay start of Pharmacological VTE agent (>24hrs) due to surgical blood loss or risk of bleeding: yes

## 2021-01-12 NOTE — Progress Notes (Signed)
Inpatient Diabetes Program Recommendations  AACE/ADA: New Consensus Statement on Inpatient Glycemic Control (2015)  Target Ranges:  Prepandial:   less than 140 mg/dL      Peak postprandial:   less than 180 mg/dL (1-2 hours)      Critically ill patients:  140 - 180 mg/dL   Lab Results  Component Value Date   GLUCAP 289 (H) 01/11/2021   HGBA1C 7.5 (H) 01/11/2021    Review of Glycemic Control Admitted with sepsis Diabetes history: DM Outpatient Diabetes medications: Omnipod Insulin Pump Current orders for Inpatient glycemic control: Insulin Pump  Inpatient Diabetes Program Recommendations:    Patient may have better glycemic control changing to subcutaneous insulin while in the hospital.  Patient sees Dr. Loanne Drilling as endocrinologist and was trained by Leonia Reader on Omnipod insulin pump 01/09/21 with the following settings:  "OmniPod insulin pump.  Settings were put in per Dr. Cordelia Pen orders:  Basal rate: 0.1u/hr, I/C ratio: 30, ISF: 100, timing 6 hours.  Target: 100 with correction over 120"  Spoke with Dr. Tyrell Antonio and plans include: -Levemir 5 units daily -Novolog 0-9 units tid -Adjust and add Novolog 3 units tid meal coverage if eats 50% meals.  Thank you, Duane Hall. Adler Alton, RN, MSN, CDE  Diabetes Coordinator Inpatient Glycemic Control Team Team Pager [phone] (8am-5pm) 01/12/2021 12:55 PM

## 2021-01-12 NOTE — ED Notes (Signed)
Report given to [place], OR RN.

## 2021-01-12 NOTE — Consult Note (Addendum)
Reason for Consult: wet gangrene of R hallux Referring Physician: Elmarie Shiley, MD   Duane Hall is an 65 y.o. male.  HPI: 65 year old male well known to me in the outpatient setting for chronic recurrent bilateral foot ulcerations and PAD managed by Dr [name], MD. Recently he developed a R hallux ulceration with delayed healing, he underwent RLE SFA and PTA angioplasty in late December with no residual stenosis and inline single vessel flow through the pedal arch via the PT. Yesterday 1/13, he called my office with worsening of the right hallux and concerns for infection and presented to the ED. In the ED, he had WBC of 13.1 a malodorous R hallux with purulent drainage and signs of osteomyelitis on the distal phalanx.   Past Medical History:  Diagnosis Date  . Asthma   . Cancer ([place])   . Cataract   . CKD (chronic kidney disease), stage III ([place])   . Diabetes mellitus   . Glaucoma   . Vascular insufficiency 05/2020    Past Surgical History:  Procedure Laterality Date  . ABDOMINAL AORTOGRAM W/LOWER EXTREMITY N/A 06/19/2020   Procedure: ABDOMINAL AORTOGRAM W/LOWER EXTREMITY;  Surgeon: Waynetta Sandy, MD;  Location: [facility];  Service: Cardiovascular;  Laterality: N/A;  . ABDOMINAL AORTOGRAM W/LOWER EXTREMITY Left 10/16/2020   Procedure: ABDOMINAL AORTOGRAM W/LOWER EXTREMITY;  Surgeon: Waynetta Sandy, MD;  Location: [facility];  Service: Cardiovascular;  Laterality: Left;  . ABDOMINAL AORTOGRAM W/LOWER EXTREMITY N/A 12/25/2020   Procedure: ABDOMINAL AORTOGRAM W/LOWER EXTREMITY;  Surgeon: Waynetta Sandy, MD;  Location: [facility];  Service: Cardiovascular;  Laterality: N/A;  . AV FISTULA PLACEMENT Right 09/14/2019   Procedure: Right Arm Basilic Vein transposition;  Surgeon: Angelia Mould, MD;  Location: [facility];  Service: Vascular;  Laterality: Right;  . BONE BIOPSY Left 07/29/2020   Procedure: BONE BIOPSY;   Surgeon: [name], DPM;  Location: [facility];  Service: Podiatry;  Laterality: Left;  Need bone trephines and/or large bore Giamshidi  . COLONOSCOPY    . PERIPHERAL VASCULAR ATHERECTOMY Left 06/19/2020   Procedure: PERIPHERAL VASCULAR ATHERECTOMY;  Surgeon: Waynetta Sandy, MD;  Location: [facility];  Service: Cardiovascular;  Laterality: Left;  SFA  . PERIPHERAL VASCULAR ATHERECTOMY  12/25/2020   Procedure: PERIPHERAL VASCULAR ATHERECTOMY;  Surgeon: Waynetta Sandy, MD;  Location: [facility];  Service: Cardiovascular;;  Lt. PT - Laser Lt. SFA - Laser  . PERIPHERAL VASCULAR BALLOON ANGIOPLASTY  12/25/2020   Procedure: PERIPHERAL VASCULAR BALLOON ANGIOPLASTY;  Surgeon: Waynetta Sandy, MD;  Location: [facility];  Service: Cardiovascular;;  Lt. SFA and PT  . UPPER GASTROINTESTINAL ENDOSCOPY  02/2019   Dr Benson Norway      Family History  Problem Relation Age of Onset  . Cancer Father   . Diabetes Mother     Social History:  reports that he has been smoking cigarettes. He has been smoking about 0.30 packs per day. He has never used smokeless tobacco. He reports that he does not drink alcohol and does not use drugs.  Allergies:  Allergies  Allergen Reactions  . Augmentin [Amoxicillin-Pot Clavulanate] Diarrhea  . Midodrine Other (See Comments)    Other reaction(s): Urinary Sensation  . Tape Other (See Comments)    bandaids cause blistering    Medications: I have reviewed the patient's current medications.  Results for orders placed or performed during the hospital encounter of  01/11/21 (from the past 48 hour(s))  CBG monitoring, ED     Status: Abnormal   Collection Time: 01/11/21  6:17 PM  Result Value Ref Range   Glucose-Capillary 289 (H) 70 - 99 mg/dL    Comment: Glucose reference range applies only to samples taken after fasting for at least 8 hours.  Culture, blood (Routine x 2)     Status: None (Preliminary result)   Collection  Time: 01/11/21  6:20 PM   Specimen: BLOOD RIGHT ARM  Result Value Ref Range   Specimen Description BLOOD RIGHT ARM UPPER    Special Requests      BOTTLES DRAWN AEROBIC AND ANAEROBIC Blood Culture adequate volume   Culture      NO GROWTH < 12 HOURS Performed at [facility], [place] [address], [place], [place] [address]    Report Status PENDING   Comprehensive metabolic panel     Status: Abnormal   Collection Time: 01/11/21  6:22 PM  Result Value Ref Range   Sodium 131 (L) 135 - 145 mmol/L   Potassium 3.1 (L) 3.5 - 5.1 mmol/L   Chloride 89 (L) 98 - 111 mmol/L   CO2 30 22 - 32 mmol/L   Glucose, Bld 303 (H) 70 - 99 mg/dL    Comment: Glucose reference range applies only to samples taken after fasting for at least 8 hours.   BUN 11 8 - 23 mg/dL   Creatinine, Ser 3.39 (H) 0.61 - 1.24 mg/dL   Calcium 9.0 8.9 - 10.3 mg/dL   Total Protein 6.4 (L) 6.5 - 8.1 g/dL   Albumin 3.1 (L) 3.5 - 5.0 g/dL   AST 22 15 - 41 U/L   ALT 22 0 - 44 U/L   Alkaline Phosphatase 124 38 - 126 U/L   Total Bilirubin 0.5 0.3 - 1.2 mg/dL   GFR, Estimated 20 (L) >60 mL/min    Comment: (NOTE) Calculated using the CKD-EPI Creatinine Equation (2021)    Anion gap 12 5 - 15    Comment: Performed at [facility] [address], [place], [place] [address]  Lactic acid, plasma     Status: None   Collection Time: 01/11/21  6:22 PM  Result Value Ref Range   Lactic Acid, Venous 1.6 0.5 - 1.9 mmol/L    Comment: Performed at [facility] [address], [place], [place] [address]  CBC with Differential     Status: Abnormal   Collection Time: 01/11/21  6:22 PM  Result Value Ref Range   WBC 13.1 (H) 4.0 - 10.5 K/uL   RBC 2.89 (L) 4.22 - 5.81 MIL/uL   Hemoglobin 9.4 (L) 13.0 - 17.0 g/dL   HCT 26.6 (L) 39.0 - 52.0 %   MCV 92.0 80.0 - 100.0 fL   MCH 32.5 26.0 - 34.0 pg   MCHC 35.3 30.0 - 36.0 g/dL   RDW 12.1 11.5 - 15.5 %   Platelets 219 150 - 400 K/uL   nRBC 0.0 0.0 - 0.2 %   Neutrophils Relative % 86  %   Neutro Abs 11.5 (H) 1.7 - 7.7 K/uL   Lymphocytes Relative 6 %   Lymphs Abs 0.8 0.7 - 4.0 K/uL   Monocytes Relative 5 %   Monocytes Absolute 0.7 0.1 - 1.0 K/uL   Eosinophils Relative 1 %   Eosinophils Absolute 0.1 0.0 - 0.5 K/uL   Basophils Relative 1 %   Basophils Absolute 0.1 0.0 - 0.1 K/uL  Immature Granulocytes 1 %   Abs Immature Granulocytes 0.06 0.00 - 0.07 K/uL    Comment: Performed at [facility], [place] [address], [place], [place] [address]  Protime-INR     Status: None   Collection Time: 01/11/21  6:22 PM  Result Value Ref Range   Prothrombin Time 14.1 11.4 - 15.2 seconds   INR 1.1 0.8 - 1.2    Comment: (NOTE) INR goal varies based on device and disease states. Performed at [facility], [place] [address], [place], [place] [address]   APTT     Status: None   Collection Time: 01/11/21  6:32 PM  Result Value Ref Range   aPTT 31 24 - 36 seconds    Comment: Performed at [facility] [address], [place], [place] [address]  Lactic acid, plasma     Status: None   Collection Time: 01/11/21  6:53 PM  Result Value Ref Range   Lactic Acid, Venous 1.5 0.5 - 1.9 mmol/L    Comment: Performed at [facility] [address], [place], [place] [address]  Culture, blood (Routine x 2)     Status: None (Preliminary result)   Collection Time: 01/11/21  6:55 PM   Specimen: BLOOD  Result Value Ref Range   Specimen Description BLOOD BLOOD LEFT FOREARM    Special Requests      BOTTLES DRAWN AEROBIC AND ANAEROBIC Blood Culture results may not be optimal due to an excessive volume of blood received in culture bottles   Culture      NO GROWTH < 12 HOURS Performed at [facility] [address], [place], [place] [address]    Report Status PENDING   Resp Panel by RT-PCR (Flu A&B, Covid) Nasopharyngeal Swab     Status: None   Collection Time: 01/11/21  8:14 PM   Specimen: Nasopharyngeal Swab; Nasopharyngeal(NP) swabs in vial transport medium  Result  Value Ref Range   SARS Coronavirus 2 by RT PCR NEGATIVE NEGATIVE    Comment: (NOTE) SARS-CoV-2 target nucleic acids are NOT DETECTED.  The SARS-CoV-2 RNA is generally detectable in upper respiratory specimens during the acute phase of infection. The lowest concentration of SARS-CoV-2 viral copies this assay can detect is 138 copies/mL. A negative result does not preclude SARS-Cov-2 infection and should not be used as the sole basis for treatment or other patient management decisions. A negative result may occur with  improper specimen collection/handling, submission of specimen other than nasopharyngeal swab, presence of viral mutation(s) within the areas targeted by this assay, and inadequate number of viral copies(<138 copies/mL). A negative result must be combined with clinical observations, patient history, and epidemiological information. The expected result is Negative.  Fact Sheet for Patients:  EntrepreneurPulse.com.au  Fact Sheet for Healthcare Providers:  IncredibleEmployment.be  This test is no t yet approved or cleared by the [place] FDA and  has been authorized for detection and/or diagnosis of SARS-CoV-2 by FDA under an Emergency Use Authorization (EUA). This EUA will remain  in effect (meaning this test can be used) for the duration of the COVID-19 declaration under Section 564(b)(1) of the Act, 21 U.S.C.section 360bbb-3(b)(1), unless the authorization is terminated  or revoked sooner.       Influenza A by PCR NEGATIVE NEGATIVE   Influenza B by PCR NEGATIVE NEGATIVE    Comment: (NOTE) The Xpert Xpress SARS-CoV-2/FLU/RSV plus assay is intended as an aid in the diagnosis of influenza from Nasopharyngeal swab  specimens and should not be used as a sole basis for treatment. Nasal washings and aspirates are unacceptable for Xpert Xpress SARS-CoV-2/FLU/RSV testing.  Fact Sheet for  Patients: EntrepreneurPulse.com.au  Fact Sheet for Healthcare Providers: IncredibleEmployment.be  This test is not yet approved or cleared by the [place] FDA and has been authorized for detection and/or diagnosis of SARS-CoV-2 by FDA under an Emergency Use Authorization (EUA). This EUA will remain in effect (meaning this test can be used) for the duration of the COVID-19 declaration under Section 564(b)(1) of the Act, 21 U.S.C. section 360bbb-3(b)(1), unless the authorization is terminated or revoked.  Performed at [facility], [place] [address], [place], [place] [address]   Hemoglobin A1c     Status: Abnormal   Collection Time: 01/11/21  9:51 PM  Result Value Ref Range   Hgb A1c MFr Bld 7.5 (H) 4.8 - 5.6 %    Comment: (NOTE) Pre diabetes:          5.7%-6.4%  Diabetes:              >6.4%  Glycemic control for   <7.0% adults with diabetes    Mean Plasma Glucose 168.55 mg/dL    Comment: Performed at [facility] [address] [place], [place] [address]  HIV Antibody (routine testing w rflx)     Status: None   Collection Time: 01/11/21  9:51 PM  Result Value Ref Range   HIV Screen 4th Generation wRfx Non Reactive Non Reactive    Comment: Performed at [facility], [place] [address] [place], [place] [address]  Lactic acid, plasma     Status: None   Collection Time: 01/11/21  9:51 PM  Result Value Ref Range   Lactic Acid, Venous 1.0 0.5 - 1.9 mmol/L    Comment: Performed at [facility] [address], [place], [place] [address]  Glucose, capillary     Status: Abnormal   Collection Time: 01/12/21 12:38 PM  Result Value Ref Range   Glucose-Capillary 273 (H) 70 - 99 mg/dL    Comment: Glucose reference range applies only to samples taken after fasting for at least 8 hours.    DG Os Calcis Left  Result Date: 01/11/2021 CLINICAL DATA:  Pressure wound to the left heel. Evaluate for osteomyelitis. EXAM: LEFT OS  CALCIS - 2+ VIEW COMPARISON:  None. FINDINGS: No fracture or bone lesion. There is no bone resorption to suggest osteomyelitis. Small dorsal calcaneal spur. Soft tissue swelling is seen across the heel with no soft tissue air. IMPRESSION: 1. Soft tissue swelling, but no evidence of osteomyelitis. No soft tissue air. Electronically Signed   By: Lajean Manes M.D.   On: 01/11/2021 19:44   DG Chest Port 1 View  Result Date: 01/11/2021 CLINICAL DATA:  Questionable sepsis. Necrotic great toe a cellulitis. EXAM: PORTABLE CHEST 1 VIEW COMPARISON:  07/26/2020 FINDINGS: Cardiac silhouette is normal in size. No mediastinal or hilar masses. No evidence of adenopathy. Clear lungs.  No pleural effusion or pneumothorax. Skeletal structures are grossly intact. IMPRESSION: No active disease. Electronically Signed   By: Lajean Manes M.D.   On: 01/11/2021 19:38   DG Foot Complete Right  Result Date: 01/11/2021 CLINICAL DATA:  Necrotic great toe with cellulitis. Assess for osteomyelitis. EXAM: RIGHT FOOT COMPLETE - 3+ VIEW COMPARISON:  None. FINDINGS: Subtle loss of the white cortical line along the medial margin of the mid to distal great toe proximal phalanx and at the medial  base of the great toe distal phalanx, suspicious for osteomyelitis. There is overlying soft tissue swelling and soft tissue air. No other evidence of osteomyelitis.  No fracture or bone lesion. Joints are normally spaced and aligned. Soft tissue swelling and air extends to the medial aspect of the forefoot adjacent to the first metatarsophalangeal joint. There is additional forefoot soft tissue swelling with no additional soft tissue air. Vascular calcifications extend from the ankle to the digital arteries. IMPRESSION: 1. Subtle evidence of osteomyelitis along the medial aspect of the proximal phalanx of the great toe and somewhat more quickly at the medial base of the distal phalanx of the great toe. No other evidence of osteomyelitis. No fracture or  bone lesion. 2. Soft tissue swelling with soft tissue air that extends from the great toe 2 adjacent to the first metatarsophalangeal joint. Electronically Signed   By: Lajean Manes M.D.   On: 01/11/2021 19:43    Review of Systems  Constitutional: Positive for chills, fever and malaise/fatigue.  HENT: Negative.   Eyes: Negative.   Respiratory: Negative for cough and shortness of breath.   Cardiovascular: Negative for chest pain.  Gastrointestinal: Negative.   Genitourinary: Negative.   Musculoskeletal: Negative.   Skin: Negative.   Neurological: Negative.   Endo/Heme/Allergies: Negative.   Psychiatric/Behavioral: Negative.   All other systems reviewed and are negative.  Blood pressure (!) 92/59, pulse 88, temperature 98.7 F (37.1 C), temperature source Oral, resp. rate 16, height 5\' 9"  (1.753 m), weight 77.1 kg, SpO2 98 %.  Vitals:   01/12/21 0918 01/12/21 1223  BP: (!) 125/58 (!) 92/59  Pulse: 81 88  Resp: 10 16  Temp:  98.7 F (37.1 C)  SpO2: 98% 98%    General AA&O x3. Normal mood and affect.  Vascular dopplerable PT pulse with biphasic flow, unable to doppler peroneal and ATA in foot, there is retrograde flow through the 1st perforator via the PTA to dorsal tissues  Neurologic Epicritic sensation grossly absent.  Dermatologic (Wound) Granular left heel ulcer  R hallux necrotic with purulence and malodor to level of MTPJ  Orthopedic: Motor intact BLE.    Assessment/Plan:  R hallux wet gangrene -Imaging: Studies independently reviewed -Antibiotics: continue broad spectrum abx with vancomycin and cefepime, will send tissue culture today -WB Status: partial WB to heel on RLE after OR today -Wound Care: Santyl to left heel with saline wet to dry dressings -Surgical Plan: To OR today for partial 1st ray amputation. We had a long discussion about the arterial flow and risk of re amputation and ulceration and worsening gangrene. We discussed this level may fail and then  BKA would be the next option. We also discussed if he ulcerates at the 2nd toe or submet 2, transmet amputation would be the next step. They understand the risks and would like to proceed  [name] 01/12/2021, 12:56 PM   Best available via secure chat for questions or concerns.

## 2021-01-13 ENCOUNTER — Encounter (HOSPITAL_COMMUNITY): Payer: Self-pay | Admitting: Podiatry

## 2021-01-13 DIAGNOSIS — I96 Gangrene, not elsewhere classified: Secondary | ICD-10-CM

## 2021-01-13 DIAGNOSIS — I739 Peripheral vascular disease, unspecified: Secondary | ICD-10-CM | POA: Diagnosis not present

## 2021-01-13 DIAGNOSIS — A419 Sepsis, unspecified organism: Secondary | ICD-10-CM | POA: Diagnosis not present

## 2021-01-13 DIAGNOSIS — M869 Osteomyelitis, unspecified: Secondary | ICD-10-CM | POA: Diagnosis not present

## 2021-01-13 MED ORDER — GABAPENTIN 100 MG PO CAPS
200.0000 mg | ORAL_CAPSULE | Freq: Once | ORAL | Status: AC
  Administered 2021-01-13: 200 mg via ORAL
  Filled 2021-01-13: qty 2

## 2021-01-13 MED ORDER — HYDROCODONE-ACETAMINOPHEN 5-325 MG PO TABS: 1.0000 | ORAL_TABLET | Freq: Four times a day (QID) | ORAL | 0 refills | Status: AC | PRN

## 2021-01-13 MED ORDER — VANCOMYCIN HCL 750 MG/150ML IV SOLN
750.0000 mg | INTRAVENOUS | Status: DC
  Filled 2021-01-13: qty 150

## 2021-01-13 MED ORDER — LEVOFLOXACIN 500 MG PO TABS: 500.0000 mg | ORAL_TABLET | ORAL | 0 refills | Status: AC

## 2021-01-13 MED ORDER — CEPHALEXIN 500 MG PO CAPS: 500.0000 mg | ORAL_CAPSULE | Freq: Two times a day (BID) | ORAL | 0 refills | Status: AC

## 2021-01-13 MED ORDER — GABAPENTIN 300 MG PO CAPS: 300.0000 mg | ORAL_CAPSULE | Freq: Every day | ORAL | Status: DC

## 2021-01-13 NOTE — Progress Notes (Signed)
Orthopedic Tech Progress Note Patient Details:  Duane Hall [facility] 24-Jan-1956 [id number]  Ortho Devices Type of Ortho Device: Postop shoe/boot Ortho Device/Splint Location: Right Lower Extremity Ortho Device/Splint Interventions: Ordered,Application,Adjustment   Post Interventions Patient Tolerated: Well Instructions Provided: Adjustment of device,Care of device,Poper ambulation with device   Duane Hall P [name] 01/13/2021, 12:00 PM

## 2021-01-13 NOTE — Progress Notes (Addendum)
  Subjective:  Patient ID: Duane Hall, male    DOB: [birth year],  MRN: [id number]  Doing well, has no pain in foot. Had a low grade temp (100.1) overnight.   Negative for chest pain and shortness of breath Night sweats: no Constitutional signs: no Review of all other systems is negative Objective:   Vitals:   01/13/21 0600 01/13/21 0700  BP: 128/67 (!) 112/58  Pulse: 84 82  Resp: 10   Temp: 100.1 F (37.8 C) 99.4 F (37.4 C)  SpO2: 97% 94%   General AA&O x3. Normal mood and affect.  Vascular Toes wwp capillary fill is intact ble  Neurologic Epicritic sensation grossly absent.  Dermatologic Dressings c/d/i  Orthopedic: Motor grossly intact    Assessment & Plan:  Patient was evaluated and treated and all questions answered.  POD#1 s/p R partial first ray amputation and advancement of myocutaneous flap -WBAT in flat surgical shoe to heel on R, WBAT on LLE  -Continue santyl to left heel -No dressing changes needed for RLE -Limb salvage prospect remains guarded -F/U with me on 1/25 at scheduled appt -Abx for discharge: 2 weeks of keflex for and levofloxacin renal dosing. Cx with GPCs in pairs and GNRs -Given his reticence to stay further and impending winter weather next day or two I think it would be reasonable to send home today with abx with strict return precautions for further fevers/constitutional signs  [name], DPM  Accessible via secure chat for questions or concerns.

## 2021-01-13 NOTE — Progress Notes (Signed)
Pt refusing CBGs - prefers to take on his own tapping device AM result per his device is 165-2 units sliding scale given per order.

## 2021-01-13 NOTE — Progress Notes (Signed)
Pt prefers to take own CBG by tapping his device. These are the following resullts: 01/12/21 (20:44)=169 01/13/21 (00:01)=153 01/13/21 (06:00)=156

## 2021-01-13 NOTE — Discharge Summary (Signed)
Physician Discharge Summary  Cloyce Blankenhorn [facility] ENI:[id number] DOB: 02-23-1956 DOA: 01/11/2021  PCP: Pieter Partridge, PA  Admit date: 01/11/2021 Discharge date: 01/13/2021  Admitted From: Home Disposition: Home   Recommendations for Outpatient Follow-up:  1. Follow up with PCP in 1-2 weeks 2. Please obtain BMP/CBC in one week 3. Please follow up on the following pending results: Final report of wound culture.   Home Health: None  Discharge Condition: Stable.  CODE STATUS: Full code Diet recommendation: Carb Modified   Brief/Interim Summary: 65 year old with past medical history significant for ESRD on hemodialysis at home, diabetes on insulin pump, chronic anemia, peripheral vascular disease status post recent procedure December 25, 2020 by Dr. Donzetta Matters patient underwent laser atherectomy and balloon angioplasty of the right lower extremity arteries, has been noticed to have increasing discoloration of the right great toe over the last few weeks and is being acutely worsening last few days.  Patient is followed by podiatry Dr. Lynann Bologna.  Today after dialysis patient was noted to have fever and was brought to the ER.  In the emergency room patient was noted to be hypotensive with blood pressure systolic in the 67 temperature of 103.  Chest x-ray unremarkable.  COVID test negative.  X-ray of the right foot via possible osteomyelitis of the right great toe and also patient's right great toe looks necrotic.  There is swelling of the right foot as well. Pulses are dopplerable.    1-Sepsis; secondary to right foot cellulitis, great toe infection, necrosis.  Patient presents with Fever, Tachycardia, leukocytosis, tachypnea hypotension.  Treated with Vancomycin and cefepime/  Underwent first amputation ray right foot on 1/14. Discharge on  Vicodin/dilaudid PRN for pain controlled.  Patient evaluated by Dr. Sherryle Lis this morning who discussed with the patient and plan is to discharge patient home. I  would have prefer patient be afebrile for 24 hours. Although he had very low grade fever. Plan to discharge on empirically Keflex and Levaquin. Cultures to be follow by Dr Sherryle Lis.  Rest of vital sign are stable.   2-Diabetes Mellitus; Plan to discontinue insulin Pump.  Started  Levemir and SSI.  CBG 160--273 Plan to resume insulin pump at discharge.   3-PVD; holding aspirin and plavix in anticipation to surgery.  Underwent laser atherectomy and ballon angioplasty by Dr Donzetta Matters 2021. Ok to resume Plavix and aspirin per Dr Sherryle Lis.   4-ESRD on HD at home; He gets HD at home 4 times a week. Nephrology consulted.  Awaiting Nephrology decision in regards HD prior to discharge.   5-Chronic Anemia from Chronic kidney disease. Follow trend.  6-Hypokalemia;  Mild.   See wound care note documentation  Pressure Injury 06/19/20 Heel Left Unstageable - Full thickness tissue loss in which the base of the injury is covered by slough (yellow, tan, gray, green or brown) and/or eschar (tan, brown or black) in the wound bed. eschar  (Active)  06/19/20 2010  Location: Heel  Location Orientation: Left  Staging: Unstageable - Full thickness tissue loss in which the base of the injury is covered by slough (yellow, tan, gray, green or brown) and/or eschar (tan, brown or black) in the wound bed.  Wound Description (Comments): eschar   Present on Admission: Yes     Pressure Injury 06/19/20 Toe (Comment  which one) Bilateral;Right;Left Stage 2 -  Partial thickness loss of dermis presenting as a shallow open injury with a red, pink wound bed without slough. scab formed on both toes (Active)  06/19/20 2010  Location: Toe (Comment  which one)  Location Orientation: Bilateral;Right;Left  Staging: Stage 2 -  Partial thickness loss of dermis presenting as a shallow open injury with a red, pink wound bed without slough.  Wound Description (Comments): scab formed on both toes  Present on Admission: Yes      Pressure Injury 06/19/20 Heel Right Stage 2 -  Partial thickness loss of dermis presenting as a shallow open injury with a red, pink wound bed without slough. skin tear (Active)  06/19/20 2010  Location: Heel  Location Orientation: Right  Staging: Stage 2 -  Partial thickness loss of dermis presenting as a shallow open injury with a red, pink wound bed without slough.  Wound Description (Comments): skin tear  Present on Admission: Yes        Discharge Diagnoses:  Principal Problem:   Sepsis ([place]) Active Problems:   Type II diabetes mellitus ([place])   Hypertension   Anemia of chronic disease   End-stage renal disease on hemodialysis ([place])   Cellulitis of right foot   Osteomyelitis of right foot [facility])    Discharge Instructions  Discharge Instructions    Diet - low sodium heart healthy   Complete by: As directed    Discharge wound care:   Complete by: As directed    See instruction on AVS   Increase activity slowly   Complete by: As directed      Allergies as of 01/13/2021      Reactions   Augmentin [amoxicillin-pot Clavulanate] Diarrhea   Midodrine Other (See Comments)   Other reaction(s): Urinary Sensation   Tape Other (See Comments)   bandaids cause blistering      Medication List    STOP taking these medications   NovoLOG FlexPen 100 UNIT/ML FlexPen Generic drug: insulin aspart   sennosides-docusate sodium 8.6-50 MG tablet Commonly known as: SENOKOT-S     TAKE these medications   aspirin 81 MG EC tablet Take 1 tablet (81 mg total) by mouth daily. Swallow whole. What changed: when to take this   cephALEXin 500 MG capsule Commonly known as: KEFLEX Take 1 capsule (500 mg total) by mouth 2 (two) times daily for 14 days.   clopidogrel 75 MG tablet Commonly known as: PLAVIX Take 1 tablet (75 mg total) by mouth daily with breakfast. What changed: when to take this   docusate sodium 100 MG capsule Commonly known as: COLACE Take 100 mg by mouth  every morning.   ferric citrate 1 GM 210 MG(Fe) tablet Commonly known as: AURYXIA Take 210 mg by mouth 2 (two) times daily with a meal.   FreeStyle Libre 14 Day Sensor Misc 1 Device by Other route every 14 (fourteen) days.   gabapentin 100 MG capsule Commonly known as: NEURONTIN Take 300 mg by mouth every morning.   Gvoke HypoPen 1-Pack 1 MG/0.2ML Soaj Generic drug: Glucagon Inject 1 mg into the skin once as needed (low blood sugar).   HYDROcodone-acetaminophen 5-325 MG tablet Commonly known as: NORCO/VICODIN Take 1 tablet by mouth every 6 (six) hours as needed for up to 5 days for moderate pain.   insulin pump Soln Inject into the skin continuous. Novolog   levofloxacin 500 MG tablet Commonly known as: Levaquin Take 1 tablet (500 mg total) by mouth every other day for 14 days.   lidocaine-prilocaine cream Commonly known as: EMLA Apply 1 application topically See admin instructions. Apply topically to port access one hour prior to dialysis on Sunday, Monday, Wednesday, Thursday   MIRCERA  IJ Inject into the skin every 30 (thirty) days.   multivitamin Tabs tablet Take 1 tablet by mouth every morning.   mupirocin ointment 2 % Commonly known as: BACTROBAN Apply 1 application topically 2 (two) times daily.   NovoLIN N FlexPen 100 UNIT/ML Kiwkpen Generic drug: Insulin NPH (Human) ([place]ophane) Inject 2 Units into the skin at bedtime.   omeprazole 40 MG capsule Commonly known as: PRILOSEC Take 40 mg by mouth every morning.   Pen Needles 31G X 6 MM Misc 1 Device by Does not apply route in the morning, at noon, in the evening, and at bedtime.   rosuvastatin 10 MG tablet Commonly known as: CRESTOR TAKE 1 TABLET BY MOUTH DAILY What changed: when to take this   Santyl ointment Generic drug: collagenase Apply 1 application topically See admin instructions. Apply to the left heel daily with a layer the thickness of a nickel            Discharge Care Instructions   (From admission, onward)         Start     Ordered   01/13/21 0000  Discharge wound care:       Comments: See instruction on AVS   01/13/21 0949          Follow-up Information    Adrienne Mocha A, PA Follow up in 1 week(s).   Specialty: Family Medicine Contact information: [address] [place] [address] [phone]        [name], DPM Follow up in 1 week(s).   Specialty: Podiatry Contact information: [address] [place] [address] [phone]              Allergies  Allergen Reactions  . Augmentin [Amoxicillin-Pot Clavulanate] Diarrhea  . Midodrine Other (See Comments)    Other reaction(s): Urinary Sensation  . Tape Other (See Comments)    bandaids cause blistering    Consultations:  Nephrology   Dr Sherryle Lis.    Procedures/Studies: DG Os Calcis Left  Result Date: 01/11/2021 CLINICAL DATA:  Pressure wound to the left heel. Evaluate for osteomyelitis. EXAM: LEFT OS CALCIS - 2+ VIEW COMPARISON:  None. FINDINGS: No fracture or bone lesion. There is no bone resorption to suggest osteomyelitis. Small dorsal calcaneal spur. Soft tissue swelling is seen across the heel with no soft tissue air. IMPRESSION: 1. Soft tissue swelling, but no evidence of osteomyelitis. No soft tissue air. Electronically Signed   By: Lajean Manes M.D.   On: 01/11/2021 19:44   PERIPHERAL VASCULAR CATHETERIZATION  Result Date: 12/25/2020 Patient name: KORTNEY SCHOENFELDER MRN: [id number] DOB: 1956-12-04 Sex: male 12/25/2020 Pre-operative Diagnosis: Critical right lower extremity ischemia with toe ulcer Post-operative diagnosis:  Same Surgeon:  Erlene Quan C. Donzetta Matters, MD Procedure Performed: 1.  Ultrasound-guided cannulation left common femoral artery 2.  Aortogram with bilateral lower extremity runoff 3.  Laser arthrectomy of right SFA and right posterior tibial arteries with 1.5 mm [place] 4.  Balloon angioplasty right posterior tibial artery with 3 mm balloon 5.   Drug-coated balloon angioplasty right SFA with 5 mm [place] 6.  Minx device closure left common femoral artery 7.  Moderate sedation with fentanyl and Versed for 68 minutes Indications: 65 year old male with a history of left SFA intervention.  He now has worsening ulceration of the right toe though the heel ulceration has completely resolved.  He is now indicated for right lower extremity angiography with possible intervention. Findings: The aorta and iliac segments have significant calcifications however  there are no flow-limiting stenosis.  The right side which is the site of interest has approximately 90% stenosis in the mid SFA.  After intervention this is resolved to less than 10%.  Posterior tibial artery is the dominant runoff to the foot it frankly occludes just after its takeoff.  After atherectomy and balloon angioplasty there is 0% residual stenosis.  There is a peroneal artery to the ankle level this is more diminutive the anterior tibial artery is occluded after its takeoff.  Left lower extremity has multiple less than 50% SFA stenoses.  Posterior tibial artery has proximal 50% stenosis proximally that is the dominant runoff to the foot there is also peroneal artery there the anterior tibial artery is occluded.  Procedure:  The patient was identified in the holding area and taken to room 8.  The patient was then placed supine on the table and prepped and draped in the usual sterile fashion.  A time out was called.  Ultrasound was used to evaluate the left common femoral artery which was noted to be patent and compressible.  The area was anesthetized 1% lidocaine cannulated micropuncture needle followed by wire sheath.  And images saved the permanent record.  Bentson wires placed followed by 5 [place] sheath.  Omni catheter was placed at the level of L1 and aortogram was performed followed by bilateral extremity runoff with the above findings.  Notably there were no identifiable renal arteries consistent  with need for permanent hemodialysis.  We then crossed the bifurcation with Glidewire advantage and Omni catheter placed a long 6 French sheath patient was fully heparinized.  We crossed all the lesions with V 18 wire confirmed intraluminal access in the posterior tibial artery and weak exchanged for an 014 [place] core wire.  We then performed laser arthrectomy with a 1.5 mm laser first of the SFA at both 50 and 60 and then the posterior tibial artery at 50.  The posterior tibial arteries balloon angioplasty with a 3 x 40 mm balloon with no residual stenosis at completion.  The SFA was initially ballooned with a 5 mm balloon followed by drug-coated balloon angioplasty at nominal pressure for 3 minutes.  Completion demonstrated less than 10% residual stenosis.  Satisfied we exchanged for short 6 [place] sheath deployed minx device.  He tolerated procedure without any complication. Contrast: 160cc Brandon C. Donzetta Matters, MD [facility] Office: [phone] Pager: [phone]  [facility] Chest Port 1 View  Result Date: 01/11/2021 CLINICAL DATA:  Questionable sepsis. Necrotic great toe a cellulitis. EXAM: PORTABLE CHEST 1 VIEW COMPARISON:  07/26/2020 FINDINGS: Cardiac silhouette is normal in size. No mediastinal or hilar masses. No evidence of adenopathy. Clear lungs.  No pleural effusion or pneumothorax. Skeletal structures are grossly intact. IMPRESSION: No active disease. Electronically Signed   By: Lajean Manes M.D.   On: 01/11/2021 19:38   DG Foot Complete Right  Result Date: 01/12/2021 CLINICAL DATA:  Amputation first ray EXAM: RIGHT FOOT COMPLETE - 3+ VIEW COMPARISON:  Radiograph 01/11/2026 FINDINGS: Amputation of the first ray with osteotomy at the proximal third of the first metatarsal. Expected postsurgical change in the medial LEFT midfoot. Arterial vascular calcifications. IMPRESSION: First ray amputation at the mid metatarsal without complication. Electronically Signed   By:  Suzy Bouchard M.D.   On: 01/12/2021 16:00   DG Foot Complete Right  Result Date: 01/11/2021 CLINICAL DATA:  Necrotic great toe with cellulitis. Assess for osteomyelitis. EXAM: RIGHT FOOT COMPLETE - 3+ VIEW COMPARISON:  None.  FINDINGS: Subtle loss of the white cortical line along the medial margin of the mid to distal great toe proximal phalanx and at the medial base of the great toe distal phalanx, suspicious for osteomyelitis. There is overlying soft tissue swelling and soft tissue air. No other evidence of osteomyelitis.  No fracture or bone lesion. Joints are normally spaced and aligned. Soft tissue swelling and air extends to the medial aspect of the forefoot adjacent to the first metatarsophalangeal joint. There is additional forefoot soft tissue swelling with no additional soft tissue air. Vascular calcifications extend from the ankle to the digital arteries. IMPRESSION: 1. Subtle evidence of osteomyelitis along the medial aspect of the proximal phalanx of the great toe and somewhat more quickly at the medial base of the distal phalanx of the great toe. No other evidence of osteomyelitis. No fracture or bone lesion. 2. Soft tissue swelling with soft tissue air that extends from the great toe 2 adjacent to the first metatarsophalangeal joint. Electronically Signed   By: Lajean Manes M.D.   On: 01/11/2021 19:43      Subjective: He is alert, feeling well to go home.    Discharge Exam: Vitals:   01/13/21 0600 01/13/21 0700  BP: 128/67 (!) 112/58  Pulse: 84 82  Resp: 10   Temp: 100.1 F (37.8 C) 99.4 F (37.4 C)  SpO2: 97% 94%     General: Pt is alert, awake, not in acute distress Cardiovascular: RRR, S1/S2 +, no rubs, no gallops Respiratory: CTA bilaterally, no wheezing, no rhonchi Abdominal: Soft, NT, ND, bowel sounds + Extremities: clean dressing right foot.     The results of significant diagnostics from this hospitalization (including imaging, microbiology, ancillary and  laboratory) are listed below for reference.     Microbiology: Recent Results (from the past 240 hour(s))  Culture, blood (Routine x 2)     Status: None (Preliminary result)   Collection Time: 01/11/21  6:20 PM   Specimen: BLOOD RIGHT ARM  Result Value Ref Range Status   Specimen Description BLOOD RIGHT ARM UPPER  Final   Special Requests   Final    BOTTLES DRAWN AEROBIC AND ANAEROBIC Blood Culture adequate volume   Culture   Final    NO GROWTH < 12 HOURS Performed at [facility], [address] [place], [place] [address]    Report Status PENDING  Incomplete  Culture, blood (Routine x 2)     Status: None (Preliminary result)   Collection Time: 01/11/21  6:55 PM   Specimen: BLOOD  Result Value Ref Range Status   Specimen Description BLOOD BLOOD LEFT FOREARM  Final   Special Requests   Final    BOTTLES DRAWN AEROBIC AND ANAEROBIC Blood Culture results may not be optimal due to an excessive volume of blood received in culture bottles   Culture   Final    NO GROWTH < 12 HOURS Performed at [facility], [place] [address] [place], [place] [address]    Report Status PENDING  Incomplete  Resp Panel by RT-PCR (Flu A&B, Covid) Nasopharyngeal Swab     Status: None   Collection Time: 01/11/21  8:14 PM   Specimen: Nasopharyngeal Swab; Nasopharyngeal(NP) swabs in vial transport medium  Result Value Ref Range Status   SARS Coronavirus 2 by RT PCR NEGATIVE NEGATIVE Final    Comment: (NOTE) SARS-CoV-2 target nucleic acids are NOT DETECTED.  The SARS-CoV-2 RNA is generally detectable in upper respiratory specimens during the acute phase of  infection. The lowest concentration of SARS-CoV-2 viral copies this assay can detect is 138 copies/mL. A negative result does not preclude SARS-Cov-2 infection and should not be used as the sole basis for treatment or other patient management decisions. A negative result may occur with  improper specimen collection/handling, submission of  specimen other than nasopharyngeal swab, presence of viral mutation(s) within the areas targeted by this assay, and inadequate number of viral copies(<138 copies/mL). A negative result must be combined with clinical observations, patient history, and epidemiological information. The expected result is Negative.  Fact Sheet for Patients:  EntrepreneurPulse.com.au  Fact Sheet for Healthcare Providers:  IncredibleEmployment.be  This test is no t yet approved or cleared by the [place] FDA and  has been authorized for detection and/or diagnosis of SARS-CoV-2 by FDA under an Emergency Use Authorization (EUA). This EUA will remain  in effect (meaning this test can be used) for the duration of the COVID-19 declaration under Section 564(b)(1) of the Act, 21 U.S.C.section 360bbb-3(b)(1), unless the authorization is terminated  or revoked sooner.       Influenza A by PCR NEGATIVE NEGATIVE Final   Influenza B by PCR NEGATIVE NEGATIVE Final    Comment: (NOTE) The Xpert Xpress SARS-CoV-2/FLU/RSV plus assay is intended as an aid in the diagnosis of influenza from Nasopharyngeal swab specimens and should not be used as a sole basis for treatment. Nasal washings and aspirates are unacceptable for Xpert Xpress SARS-CoV-2/FLU/RSV testing.  Fact Sheet for Patients: EntrepreneurPulse.com.au  Fact Sheet for Healthcare Providers: IncredibleEmployment.be  This test is not yet approved or cleared by the [place] FDA and has been authorized for detection and/or diagnosis of SARS-CoV-2 by FDA under an Emergency Use Authorization (EUA). This EUA will remain in effect (meaning this test can be used) for the duration of the COVID-19 declaration under Section 564(b)(1) of the Act, 21 U.S.C. section 360bbb-3(b)(1), unless the authorization is terminated or revoked.  Performed at [facility], [place] [address],  [place], [place] [address]   Aerobic/Anaerobic Culture (surgical/deep wound)     Status: None (Preliminary result)   Collection Time: 01/12/21  2:27 PM   Specimen: PATH Soft tissue  Result Value Ref Range Status   Specimen Description TISSUE  Final   Special Requests CELLULITIS RIGHT FOOT SPEC A  Final   Gram Stain   Final    FEW WBC PRESENT, PREDOMINANTLY PMN MODERATE GRAM POSITIVE COCCI IN PAIRS MODERATE GRAM NEGATIVE RODS Performed at [facility], [place] [address] [place], [place] [address]    Culture PENDING  Incomplete   Report Status PENDING  Incomplete     Labs: BNP (last 3 results) No results for input(s): BNP in the last 8760 hours. Basic Metabolic Panel: Recent Labs  Lab 01/11/21 1822 01/12/21 1158  NA 131* 133*  K 3.1* 3.4*  CL 89* 92*  CO2 30 26  GLUCOSE 303* 261*  BUN 11 20  CREATININE 3.39* 4.77*  CALCIUM 9.0 8.3*   Liver Function Tests: Recent Labs  Lab 01/11/21 1822  AST 22  ALT 22  ALKPHOS 124  BILITOT 0.5  PROT 6.4*  ALBUMIN 3.1*   No results for input(s): LIPASE, AMYLASE in the last 168 hours. No results for input(s): AMMONIA in the last 168 hours. CBC: Recent Labs  Lab 01/11/21 1822  WBC 13.1*  NEUTROABS 11.5*  HGB 9.4*  HCT 26.6*  MCV 92.0  PLT 219   Cardiac Enzymes: No results for input(s): CKTOTAL, CKMB, CKMBINDEX,  TROPONINI in the last 168 hours. BNP: Invalid input(s): POCBNP CBG: Recent Labs  Lab 01/11/21 1817 01/12/21 1238 01/12/21 1327 01/12/21 1518 01/12/21 1655  GLUCAP 289* 273* 226* 168* 180*   D-Dimer No results for input(s): DDIMER in the last 72 hours. Hgb A1c Recent Labs    01/11/21 2151  HGBA1C 7.5*   Lipid Profile No results for input(s): CHOL, HDL, LDLCALC, TRIG, CHOLHDL, LDLDIRECT in the last 72 hours. Thyroid function studies No results for input(s): TSH, T4TOTAL, T3FREE, THYROIDAB in the last 72 hours.  Invalid input(s): FREET3 Anemia work up No results for input(s): VITAMINB12, FOLATE,  FERRITIN, TIBC, IRON, RETICCTPCT in the last 72 hours. Urinalysis    Component Value Date/Time   COLORURINE AMBER (A) 07/27/2020 1050   APPEARANCEUR CLOUDY (A) 07/27/2020 1050   LABSPEC 1.020 07/27/2020 1050   PHURINE 5.0 07/27/2020 1050   GLUCOSEU 150 (A) 07/27/2020 1050   HGBUR LARGE (A) 07/27/2020 1050   BILIRUBINUR SMALL (A) 07/27/2020 1050   KETONESUR NEGATIVE 07/27/2020 1050   PROTEINUR >=300 (A) 07/27/2020 1050   NITRITE NEGATIVE 07/27/2020 1050   LEUKOCYTESUR NEGATIVE 07/27/2020 1050   Sepsis Labs Invalid input(s): PROCALCITONIN,  WBC,  LACTICIDVEN Microbiology Recent Results (from the past 240 hour(s))  Culture, blood (Routine x 2)     Status: None (Preliminary result)   Collection Time: 01/11/21  6:20 PM   Specimen: BLOOD RIGHT ARM  Result Value Ref Range Status   Specimen Description BLOOD RIGHT ARM UPPER  Final   Special Requests   Final    BOTTLES DRAWN AEROBIC AND ANAEROBIC Blood Culture adequate volume   Culture   Final    NO GROWTH < 12 HOURS Performed at [facility], [place] [address] [place], [place] [address]    Report Status PENDING  Incomplete  Culture, blood (Routine x 2)     Status: None (Preliminary result)   Collection Time: 01/11/21  6:55 PM   Specimen: BLOOD  Result Value Ref Range Status   Specimen Description BLOOD BLOOD LEFT FOREARM  Final   Special Requests   Final    BOTTLES DRAWN AEROBIC AND ANAEROBIC Blood Culture results may not be optimal due to an excessive volume of blood received in culture bottles   Culture   Final    NO GROWTH < 12 HOURS Performed at [facility], [place] [address] [place], [place] [address]    Report Status PENDING  Incomplete  Resp Panel by RT-PCR (Flu A&B, Covid) Nasopharyngeal Swab     Status: None   Collection Time: 01/11/21  8:14 PM   Specimen: Nasopharyngeal Swab; Nasopharyngeal(NP) swabs in vial transport medium  Result Value Ref Range Status   SARS Coronavirus 2 by RT PCR NEGATIVE NEGATIVE  Final    Comment: (NOTE) SARS-CoV-2 target nucleic acids are NOT DETECTED.  The SARS-CoV-2 RNA is generally detectable in upper respiratory specimens during the acute phase of infection. The lowest concentration of SARS-CoV-2 viral copies this assay can detect is 138 copies/mL. A negative result does not preclude SARS-Cov-2 infection and should not be used as the sole basis for treatment or other patient management decisions. A negative result may occur with  improper specimen collection/handling, submission of specimen other than nasopharyngeal swab, presence of viral mutation(s) within the areas targeted by this assay, and inadequate number of viral copies(<138 copies/mL). A negative result must be combined with clinical observations, patient history, and epidemiological information. The expected result is Negative.  Fact Sheet for Patients:  EntrepreneurPulse.com.au  Fact Sheet for Healthcare Providers:  Incredible[employer]ployment[employer]be  This test is no t yet approved or cleared by the [place] FDA and  has been authorized for detection and/or diagnosis of SARS-CoV-2 by FDA under an Emergency Use Authorization (EUA). This EUA will remain  in effect (meaning this test can be used) for the duration of the COVID-19 declaration under Section 564(b)(1) of the Act, 21 U.S.C.section 360bbb-3(b)(1), unless the authorization is terminated  or revoked sooner.       Influenza A by PCR NEGATIVE NEGATIVE Final   Influenza B by PCR NEGATIVE NEGATIVE Final    Comment: (NOTE) The Xpert Xpress SARS-CoV-2/FLU/RSV plus assay is intended as an aid in the diagnosis of influenza from Nasopharyngeal swab specimens and should not be used as a sole basis for treatment. Nasal washings and aspirates are unacceptable for Xpert Xpress SARS-CoV-2/FLU/RSV testing.  Fact Sheet for Patients: EntrepreneurPulse.com.au  Fact Sheet for Healthcare  Providers: IncredibleEmployment.be  This test is not yet approved or cleared by the [place] FDA and has been authorized for detection and/or diagnosis of SARS-CoV-2 by FDA under an Emergency Use Authorization (EUA). This EUA will remain in effect (meaning this test can be used) for the duration of the COVID-19 declaration under Section 564(b)(1) of the Act, 21 U.S.C. section 360bbb-3(b)(1), unless the authorization is terminated or revoked.  Performed at [facility], [place] [address] [place], [place] [address]   Aerobic/Anaerobic Culture (surgical/deep wound)     Status: None (Preliminary result)   Collection Time: 01/12/21  2:27 PM   Specimen: PATH Soft tissue  Result Value Ref Range Status   Specimen Description TISSUE  Final   Special Requests CELLULITIS RIGHT FOOT SPEC A  Final   Gram Stain   Final    FEW WBC PRESENT, PREDOMINANTLY PMN MODERATE GRAM POSITIVE COCCI IN PAIRS MODERATE GRAM NEGATIVE RODS Performed at [facility], [place] [address] [place], [place] [address]    Culture PENDING  Incomplete   Report Status PENDING  Incomplete     Time coordinating discharge: 40 minutes  SIGNED:   Elmarie Shiley, MD  [facility]

## 2021-01-13 NOTE — Progress Notes (Signed)
Pharmacy Antibiotic Note  Duane Hall is a 65 y.o. male admitted on 01/11/2021 with sepsis secondary to right foot with cellulitis and necrotic right great toe. Pharmacy has been consulted for vancomycin dosing. PMH significant for ESRD on SMWTh PTA, planning TTS while admitted, next HD today 1/15. Pt s/p toe amputation 1/14, BCx negative thus far.  Plan: Cefepime 1g q24h  Schedule ancomycin 750mg  qHD TTS   Height: 5\' 9"  (175.3 cm) Weight: 77.1 kg (170 lb) IBW/kg (Calculated) : 70.7  Temp (24hrs), Avg:98.7 F (37.1 C), Min:97.5 F (36.4 C), Max:100.1 F (37.8 C)  Recent Labs  Lab 01/11/21 1822 01/11/21 1853 01/11/21 2151 01/12/21 1158  WBC 13.1*  --   --   --   CREATININE 3.39*  --   --  4.77*  LATICACIDVEN 1.6 1.5 1.0  --     Estimated Creatinine Clearance: 16.1 mL/min (A) (by C-G formula based on SCr of 4.77 mg/dL (H)).    Allergies  Allergen Reactions  . Augmentin [Amoxicillin-Pot Clavulanate] Diarrhea  . Midodrine Other (See Comments)    Other reaction(s): Urinary Sensation  . Tape Other (See Comments)    bandaids cause blistering    Antimicrobials this admission: Vancomcyin 1/13 >>  Cefepime 1/13 >>   Microbiology results: 1/13 BCx: NGTD  Thank you for allowing pharmacy to be a part of this patient's care.  Arrie Senate, PharmD, BCPS, [facility] Clinical Pharmacist [phone] Please check AMION for all [place] numbers 01/13/2021

## 2021-01-13 NOTE — Discharge Instructions (Signed)
Weight bearing as tolerated  in flat surgical shoe to heel on right, you may bear weight on the left foot now -Continue santyl to left heel daily as you have been doing -No dressing changes needed for right foot, Dr Sherryle Lis will change on your appointment on 01/23/21  Antibiotics: Take the oral antibiotics as directed

## 2021-01-14 DIAGNOSIS — R252 Cramp and spasm: Secondary | ICD-10-CM | POA: Diagnosis not present

## 2021-01-14 DIAGNOSIS — I739 Peripheral vascular disease, unspecified: Secondary | ICD-10-CM

## 2021-01-14 DIAGNOSIS — I953 Hypotension of hemodialysis: Secondary | ICD-10-CM | POA: Diagnosis not present

## 2021-01-14 DIAGNOSIS — Z992 Dependence on renal dialysis: Secondary | ICD-10-CM | POA: Diagnosis not present

## 2021-01-14 DIAGNOSIS — I96 Gangrene, not elsewhere classified: Secondary | ICD-10-CM

## 2021-01-14 DIAGNOSIS — N2581 Secondary hyperparathyroidism of renal origin: Secondary | ICD-10-CM | POA: Diagnosis not present

## 2021-01-14 DIAGNOSIS — N186 End stage renal disease: Secondary | ICD-10-CM | POA: Diagnosis not present

## 2021-01-14 NOTE — Op Note (Signed)
Patient Name: KYLIE GROS DOB: 21-Feb-1956  MRN: [id number]   Date of Service: 01/11/2021 - 01/12/2021  Surgeon: Dr. Lanae Crumbly, DPM Assistants: None Pre-operative Diagnosis:  1. Peripheral arterial disease 2. Gangrene right hallux 3. Osteomyelitis right hallux 4. Sepsis   Post-operative Diagnosis:  1. Peripheral arterial disease 2. Gangrene right hallux 3. Osteomyelitis right hallux 4. Sepsis   Procedures:  1) partial first ray amputation of right hallux  2) adjacent tissue transfer myocutaneous abductor hallucis advancement and rotational flap  Pathology/Specimens: ID Type Source Tests Collected by Time Destination  1 : First Ray Right Foot Tissue PATH Soft tissue SURGICAL PATHOLOGY [name], DPM 01/12/2021 1422   A : Tissue Culture Right Tissue PATH Soft tissue GRAM STAIN (Canceled), AEROBIC/ANAEROBIC CULTURE (SURGICAL/DEEP WOUND) [name], DPM 01/12/2021 1427    Anesthesia: monitored anesthesia care Hemostasis: * Missing tourniquet times found for documented tourniquets in log: [id number] * Estimated Blood Loss: 50 mL Materials: * No implants in log * Medications: 14cc 0.5% marcaine plain Complications: none  Indications for Procedure:  This is a 65 y.o. male with a history of severe peripheral arterial disease. He developed a gangrenous patch on the right hallux and underwent revascularization. The gangrene continued to demarcate and became wet. Amputation was recommended with partial first ray amputation to allow for closure and an appropriate level of healing.    Procedure in Detail: Patient was identified in pre-operative holding area. Formal consent was signed and the right lower extremity was marked. Patient was brought back to the operating room. Anesthesia was induced. The extremity was prepped and draped in the usual sterile fashion. Timeout was taken to confirm patient name, laterality, and procedure prior to incision.   Attention was then directed  to the right hallux where an incision was made circumferentially around the gangrenous tissue. Dissection was carried down to level of bone.  Dissection was continued to the metatarsophalangeal joint and all collateral ligaments were freed at the joint.  The bone and soft tissue attachments of the proximal phalanx were removed, the sesamoids were excised, and the hallux was passed for pathology and tissue culture. The defect measured approximately 3.0cm in diameter. I then created a triangular advancement full thickness flap via an incision from the defect of the amputation site proximally, forming the plantar arm of a fishmouth transmetatarsal amputation incision. The distal aponeurosis of the abductor hallucis was reflected from its distal attachments and tagged with 2-0 vicryl. The flap was raised from the metatarsal in full thickness. The metatarsal was resected in standard transmetatarsal amputation fashion and sent with the pathology specimens. The abductor muscle belly was then advanced and sewn to the lateral capsule of the 2nd MTPJ to cover the defect. The rotation flap was then advanced and transposed to cover over the muscle belly. Complex layered closure was completed with 2-0 vicryl, 3-0 monocryl, and 2-0 and 3-0 nylon.    The foot was then dressed with xeroform, dry sterile gauze dressings and tape. Patient tolerated the procedure well.   Disposition: Following a period of post-operative monitoring, patient will be transferred back to the inpatient unit.

## 2021-01-15 DIAGNOSIS — N2581 Secondary hyperparathyroidism of renal origin: Secondary | ICD-10-CM | POA: Diagnosis not present

## 2021-01-15 DIAGNOSIS — R252 Cramp and spasm: Secondary | ICD-10-CM | POA: Diagnosis not present

## 2021-01-15 DIAGNOSIS — I953 Hypotension of hemodialysis: Secondary | ICD-10-CM | POA: Diagnosis not present

## 2021-01-15 DIAGNOSIS — Z992 Dependence on renal dialysis: Secondary | ICD-10-CM | POA: Diagnosis not present

## 2021-01-15 DIAGNOSIS — N186 End stage renal disease: Secondary | ICD-10-CM | POA: Diagnosis not present

## 2021-01-16 ENCOUNTER — Other Ambulatory Visit: Payer: Self-pay

## 2021-01-16 DIAGNOSIS — I739 Peripheral vascular disease, unspecified: Secondary | ICD-10-CM

## 2021-01-16 LAB — CULTURE, BLOOD (ROUTINE X 2)
Culture: NO GROWTH
Culture: NO GROWTH
Special Requests: ADEQUATE

## 2021-01-16 LAB — SURGICAL PATHOLOGY

## 2021-01-17 ENCOUNTER — Telehealth: Payer: Self-pay | Admitting: Nutrition

## 2021-01-17 LAB — AEROBIC/ANAEROBIC CULTURE W GRAM STAIN (SURGICAL/DEEP WOUND)

## 2021-01-17 LAB — CULTURE, BLOOD (ROUTINE X 2)
Culture: NO GROWTH
Special Requests: ADEQUATE

## 2021-01-17 NOTE — Telephone Encounter (Signed)
Patient reports that he is having no problems giving the boluses, changing the pod, or wearing the pod.  Questions were answered about how to tell if the insulin went in.   He reports surgery on Friday, and blood sugars are still "very high" FBSs for last 2 days: 290, and during the day, going up to 480.  Lowest reading was FBS of 289 yesterday.   Please advise.  Basal rate: 0.1u/hr, ISF: 100, I/C: 30.

## 2021-01-17 NOTE — Telephone Encounter (Signed)
Please change to: Basal rate: 0.1u/hr, ISF: 100, I/C: 15 I'll see you next week

## 2021-01-18 DIAGNOSIS — I953 Hypotension of hemodialysis: Secondary | ICD-10-CM | POA: Diagnosis not present

## 2021-01-18 DIAGNOSIS — N186 End stage renal disease: Secondary | ICD-10-CM | POA: Diagnosis not present

## 2021-01-18 DIAGNOSIS — N2581 Secondary hyperparathyroidism of renal origin: Secondary | ICD-10-CM | POA: Diagnosis not present

## 2021-01-18 DIAGNOSIS — Z992 Dependence on renal dialysis: Secondary | ICD-10-CM | POA: Diagnosis not present

## 2021-01-18 DIAGNOSIS — R252 Cramp and spasm: Secondary | ICD-10-CM | POA: Diagnosis not present

## 2021-01-18 NOTE — Telephone Encounter (Signed)
Called and spoke to patient's wife Barnetta Chapel.  She suspended pump and was able to change patient's I/C setting from 30 to 15.  She then resumed pump action.  Patient is to follow up with Dr. Loanne Drilling next week.  Antonieta Iba, RD, LDN, CDCES

## 2021-01-18 NOTE — Telephone Encounter (Signed)
Please advise 

## 2021-01-19 ENCOUNTER — Other Ambulatory Visit: Payer: Self-pay

## 2021-01-19 DIAGNOSIS — I953 Hypotension of hemodialysis: Secondary | ICD-10-CM | POA: Diagnosis not present

## 2021-01-19 DIAGNOSIS — N186 End stage renal disease: Secondary | ICD-10-CM | POA: Diagnosis not present

## 2021-01-19 DIAGNOSIS — R252 Cramp and spasm: Secondary | ICD-10-CM | POA: Diagnosis not present

## 2021-01-19 DIAGNOSIS — Z992 Dependence on renal dialysis: Secondary | ICD-10-CM | POA: Diagnosis not present

## 2021-01-19 DIAGNOSIS — N2581 Secondary hyperparathyroidism of renal origin: Secondary | ICD-10-CM | POA: Diagnosis not present

## 2021-01-20 DIAGNOSIS — I7300 Raynaud's syndrome without gangrene: Secondary | ICD-10-CM | POA: Diagnosis not present

## 2021-01-20 DIAGNOSIS — L8996 Pressure-induced deep tissue damage of unspecified site: Secondary | ICD-10-CM | POA: Diagnosis not present

## 2021-01-20 DIAGNOSIS — L89623 Pressure ulcer of left heel, stage 3: Secondary | ICD-10-CM | POA: Diagnosis not present

## 2021-01-21 DIAGNOSIS — R252 Cramp and spasm: Secondary | ICD-10-CM | POA: Diagnosis not present

## 2021-01-21 DIAGNOSIS — N2581 Secondary hyperparathyroidism of renal origin: Secondary | ICD-10-CM | POA: Diagnosis not present

## 2021-01-21 DIAGNOSIS — N186 End stage renal disease: Secondary | ICD-10-CM | POA: Diagnosis not present

## 2021-01-21 DIAGNOSIS — I953 Hypotension of hemodialysis: Secondary | ICD-10-CM | POA: Diagnosis not present

## 2021-01-21 DIAGNOSIS — Z992 Dependence on renal dialysis: Secondary | ICD-10-CM | POA: Diagnosis not present

## 2021-01-22 DIAGNOSIS — Z992 Dependence on renal dialysis: Secondary | ICD-10-CM | POA: Diagnosis not present

## 2021-01-22 DIAGNOSIS — N2581 Secondary hyperparathyroidism of renal origin: Secondary | ICD-10-CM | POA: Diagnosis not present

## 2021-01-22 DIAGNOSIS — I953 Hypotension of hemodialysis: Secondary | ICD-10-CM | POA: Diagnosis not present

## 2021-01-22 DIAGNOSIS — N186 End stage renal disease: Secondary | ICD-10-CM | POA: Diagnosis not present

## 2021-01-22 DIAGNOSIS — R252 Cramp and spasm: Secondary | ICD-10-CM | POA: Diagnosis not present

## 2021-01-23 ENCOUNTER — Other Ambulatory Visit: Payer: Self-pay

## 2021-01-23 ENCOUNTER — Ambulatory Visit: Payer: BC Managed Care – PPO | Admitting: Podiatry

## 2021-01-23 ENCOUNTER — Ambulatory Visit: Payer: BC Managed Care – PPO | Admitting: Endocrinology

## 2021-01-23 VITALS — BP 90/60 | HR 101 | Ht 70.0 in | Wt 171.0 lb

## 2021-01-23 DIAGNOSIS — L89623 Pressure ulcer of left heel, stage 3: Secondary | ICD-10-CM

## 2021-01-23 DIAGNOSIS — E1122 Type 2 diabetes mellitus with diabetic chronic kidney disease: Secondary | ICD-10-CM

## 2021-01-23 DIAGNOSIS — Z992 Dependence on renal dialysis: Secondary | ICD-10-CM

## 2021-01-23 DIAGNOSIS — I739 Peripheral vascular disease, unspecified: Secondary | ICD-10-CM

## 2021-01-23 DIAGNOSIS — N186 End stage renal disease: Secondary | ICD-10-CM | POA: Diagnosis not present

## 2021-01-23 DIAGNOSIS — Z89411 Acquired absence of right great toe: Secondary | ICD-10-CM

## 2021-01-23 DIAGNOSIS — E1169 Type 2 diabetes mellitus with other specified complication: Secondary | ICD-10-CM

## 2021-01-23 DIAGNOSIS — F17200 Nicotine dependence, unspecified, uncomplicated: Secondary | ICD-10-CM

## 2021-01-23 LAB — POCT GLYCOSYLATED HEMOGLOBIN (HGB A1C): Hemoglobin A1C: 9 % — AB (ref 4.0–5.6)

## 2021-01-23 NOTE — Progress Notes (Signed)
Subjective:    Patient ID: Duane Hall, male    DOB: Oct 07, 1956, 65 y.o.   MRN: [id number]  HPI Pt returns for f/u of diabetes mellitus: DM type: Insulin-requiring type 2, but he is at risk for evolving type 1.  Dx'ed: 7564 Complications: ESRD, foot ulcers, GP, PAD, and PDR.   Therapy: insulin since 2021 (Omnipod pumps since 1/22) DKA: never Severe hypoglycemia: never.  Pancreatitis: never Pancreatic imaging:  SDOH: Wife provides most hx, due to pt's poor overall health; he eats meals at 10AM, (sometimes 2PM), and 6PM; He eats small breakfast and lunch, and a larger dinner.  Other: fructosamine converts to higher avg glucose than A1c, prob due to renal failure; he has [place] continuous glucose monitor; he stopped Trulicity, due to GP;   Interval history: He started Omnipods, with these settings:  basal rate of 0.1 units/hr, 24HRS per day. bolus of 1 unit/15 grams carbohydrate correction bolus (which some people call "sensitivity," or "insulin sensitivity ratio," or just "isr") of 1 unit for each 100 by which your glucose exceeds 120.  TDD is 10 units (80% boluses) Pt was recently in the hosp with foot cellulitis and amputation of right great toe.  He was d/c'ed 10 days ago.  I reviewed continuous glucose monitor data.  Glucose varies from 50-400.  There is no trend throughout the day, but it varies widely.  He seldom has hypoglycemia, and these episodes are mild.  He eats 2 meals per day.  He takes 3.5 boluses per day.   Past Medical History:  Diagnosis Date  . Asthma   . Cancer ([place])   . Cataract   . CKD (chronic kidney disease), stage III ([place])   . Diabetes mellitus   . Glaucoma   . Vascular insufficiency 05/2020    Past Surgical History:  Procedure Laterality Date  . ABDOMINAL AORTOGRAM W/LOWER EXTREMITY N/A 06/19/2020   Procedure: ABDOMINAL AORTOGRAM W/LOWER EXTREMITY;  Surgeon: Waynetta Sandy, MD;  Location: [facility];  Service: Cardiovascular;   Laterality: N/A;  . ABDOMINAL AORTOGRAM W/LOWER EXTREMITY Left 10/16/2020   Procedure: ABDOMINAL AORTOGRAM W/LOWER EXTREMITY;  Surgeon: Waynetta Sandy, MD;  Location: [facility];  Service: Cardiovascular;  Laterality: Left;  . ABDOMINAL AORTOGRAM W/LOWER EXTREMITY N/A 12/25/2020   Procedure: ABDOMINAL AORTOGRAM W/LOWER EXTREMITY;  Surgeon: Waynetta Sandy, MD;  Location: [facility];  Service: Cardiovascular;  Laterality: N/A;  . AMPUTATION Right 01/12/2021   Procedure: 1ST AMPUTATION RAY;  Surgeon: [name], DPM;  Location: [facility];  Service: Podiatry;  Laterality: Right;  . AV FISTULA PLACEMENT Right 09/14/2019   Procedure: Right Arm Basilic Vein transposition;  Surgeon: Angelia Mould, MD;  Location: [facility];  Service: Vascular;  Laterality: Right;  . BONE BIOPSY Left 07/29/2020   Procedure: BONE BIOPSY;  Surgeon: [name], DPM;  Location: [facility];  Service: Podiatry;  Laterality: Left;  Need bone trephines and/or large bore Giamshidi  . COLONOSCOPY    . PERIPHERAL VASCULAR ATHERECTOMY Left 06/19/2020   Procedure: PERIPHERAL VASCULAR ATHERECTOMY;  Surgeon: Waynetta Sandy, MD;  Location: [facility];  Service: Cardiovascular;  Laterality: Left;  SFA  . PERIPHERAL VASCULAR ATHERECTOMY  12/25/2020   Procedure: PERIPHERAL VASCULAR ATHERECTOMY;  Surgeon: Waynetta Sandy, MD;  Location: [facility];  Service: Cardiovascular;;  Lt. PT - Laser Lt. SFA - Laser  . PERIPHERAL VASCULAR BALLOON ANGIOPLASTY  12/25/2020   Procedure: PERIPHERAL VASCULAR BALLOON ANGIOPLASTY;  Surgeon: Waynetta Sandy, MD;  Location: [facility];  Service: Cardiovascular;;  Lt. SFA and PT  . UPPER GASTROINTESTINAL ENDOSCOPY  02/2019   Dr Benson [place]      Social History   Socioeconomic History  . Marital status: Married    Spouse name: Not on file  . Number of children: Not on file  . Years of education: Not on file  .  Highest education level: Not on file  Occupational History  . Occupation: Chief Financial Officer  Tobacco Use  . Smoking status: Current Every Day Smoker    Packs/day: 0.30    Types: Cigarettes  . Smokeless tobacco: Never Used  Vaping Use  . Vaping Use: Never used  Substance and Sexual Activity  . Alcohol use: No    Alcohol/week: 0.0 standard drinks  . Drug use: No  . Sexual activity: Not on file  Other Topics Concern  . Not on file  Social History Narrative   Married.    Social Determinants of Health   Financial Resource Strain: Not on file  Food Insecurity: Not on file  Transportation Needs: Not on file  Physical Activity: Not on file  Stress: Not on file  Social Connections: Not on file  Intimate Partner Violence: Not on file    Current Outpatient Medications on File Prior to Visit  Medication Sig Dispense Refill  . aspirin EC 81 MG EC tablet Take 1 tablet (81 mg total) by mouth daily. Swallow whole. (Patient taking differently: Take 81 mg by mouth every morning. Swallow whole.) 30 tablet 11  . cephALEXin (KEFLEX) 500 MG capsule Take 1 capsule (500 mg total) by mouth 2 (two) times daily for 14 days. 28 capsule 0  . clopidogrel (PLAVIX) 75 MG tablet Take 1 tablet (75 mg total) by mouth daily with breakfast. (Patient taking differently: Take 75 mg by mouth every morning.) 30 tablet 3  . collagenase (SANTYL) ointment Apply 1 application topically See admin instructions. Apply to the left heel daily with a layer the thickness of a nickel 30 g 2  . Continuous Blood Gluc Sensor (FREESTYLE LIBRE 14 DAY SENSOR) MISC 1 Device by Other route every 14 (fourteen) days. 6 each 0  . docusate sodium (COLACE) 100 MG capsule Take 100 mg by mouth every morning.    . ferric citrate (AURYXIA) 1 GM 210 MG(Fe) tablet Take 210 mg by mouth 2 (two) times daily with a meal.    . gabapentin (NEURONTIN) 100 MG capsule Take 300 mg by mouth every morning.    . Glucagon (GVOKE HYPOPEN 1-PACK) 1 MG/0.2ML SOAJ Inject 1  mg into the skin once as needed (low blood sugar).    . Insulin Human (INSULIN PUMP) SOLN Inject into the skin continuous. Novolog    . Insulin NPH, Human,, Isophane, (NOVOLIN N FLEXPEN) 100 UNIT/ML Kiwkpen Inject 2 Units into the skin at bedtime. 15 mL 11  . Insulin Pen Needle (PEN NEEDLES) 31G X 6 MM MISC 1 Device by Does not apply route in the morning, at noon, in the evening, and at bedtime. 360 each 3  . levofloxacin (LEVAQUIN) 500 MG tablet Take 1 tablet (500 mg total) by mouth every other day for 14 days. 7 tablet 0  . lidocaine-prilocaine (EMLA) cream Apply 1 application topically See admin instructions. Apply topically to port access one hour prior to dialysis on Sunday, Monday, Wednesday, Thursday    . Methoxy PEG-Epoetin Beta (MIRCERA IJ) Inject into the skin every 30 (thirty) days.    Marland Kitchen  multivitamin (RENA-VIT) TABS tablet Take 1 tablet by mouth every morning.    . mupirocin ointment (BACTROBAN) 2 % Apply 1 application topically 2 (two) times daily. 15 g 2  . omeprazole (PRILOSEC) 40 MG capsule Take 40 mg by mouth every morning.    . rosuvastatin (CRESTOR) 10 MG tablet TAKE 1 TABLET BY MOUTH DAILY (Patient taking differently: Take 10 mg by mouth every morning.) 30 tablet 11   Current Facility-Administered Medications on File Prior to Visit  Medication Dose Route Frequency Provider Last Rate Last Admin  . 0.9 %  sodium chloride infusion  250 mL Intravenous PRN Waynetta Sandy, MD      . 0.9 %  sodium chloride infusion  250 mL Intravenous PRN Waynetta Sandy, MD      . sodium chloride flush (NS) 0.9 % injection 3 mL  3 mL Intravenous Q12H Waynetta Sandy, MD        Allergies  Allergen Reactions  . Augmentin [Amoxicillin-Pot Clavulanate] Diarrhea  . Midodrine Other (See Comments)    Other reaction(s): Urinary Sensation  . Tape Other (See Comments)    bandaids cause blistering    Family History  Problem Relation Age of Onset  . Cancer Father   .  Diabetes Mother     BP 90/60 (BP Location: Left Arm, Patient Position: Sitting, Cuff Size: Normal)   Pulse (!) 101   Ht 5\' 10"  (1.778 m)   Wt 171 lb (77.6 kg)   SpO2 99%   BMI 24.54 kg/m   Review of Systems Denies LOC.      Objective:   Physical Exam VITAL SIGNS:  See vs page GENERAL: no distress.  In wheelchair.     Lab Results  Component Value Date   HGBA1C 9.0 (A) 01/23/2021      Assessment & Plan:  Type 1 DM, with ESRD: uncontrolled.   Hypoglycemia, due to insulin: this limits aggressiveness of glycemic control.    Patient Instructions  check your blood sugar 4 times a day: before the 3 meals, and at bedtime.  also check if you have symptoms of your blood sugar being too high or too low.  please keep a record of the readings and bring it to your next appointment here (or you can bring the meter itself).  You can write it on any piece of paper.  please call us sooner if your blood sugar goes below 70, or if you have a lot of readings over 200.  Please take these settings: basal rate of 0.1 units/hr, 24HRS per day. bolus of 1 unit/10 grams carbohydrate correction bolus (which some people call "sensitivity," or "insulin sensitivity ratio," or just "isr") of 1 unit for each 100 by which your glucose exceeds 120.   We will need to take this complex situation in stages.   Please call or message [place] in a few days, to tell us how the blood sugar is doing.   Please come back for a follow-up appointment in 2-3 weeks.

## 2021-01-23 NOTE — Patient Instructions (Addendum)
check your blood sugar 4 times a day: before the 3 meals, and at bedtime.  also check if you have symptoms of your blood sugar being too high or too low.  please keep a record of the readings and bring it to your next appointment here (or you can bring the meter itself).  You can write it on any piece of paper.  please call us sooner if your blood sugar goes below 70, or if you have a lot of readings over 200.  Please take these settings: basal rate of 0.1 units/hr, 24HRS per day. bolus of 1 unit/10 grams carbohydrate correction bolus (which some people call "sensitivity," or "insulin sensitivity ratio," or just "isr") of 1 unit for each 100 by which your glucose exceeds 120.   We will need to take this complex situation in stages.   Please call or message [place] in a few days, to tell us how the blood sugar is doing.   Please come back for a follow-up appointment in 2-3 weeks.

## 2021-01-24 ENCOUNTER — Encounter: Payer: Self-pay | Admitting: Endocrinology

## 2021-01-24 DIAGNOSIS — N2581 Secondary hyperparathyroidism of renal origin: Secondary | ICD-10-CM | POA: Diagnosis not present

## 2021-01-24 DIAGNOSIS — N186 End stage renal disease: Secondary | ICD-10-CM | POA: Diagnosis not present

## 2021-01-24 DIAGNOSIS — R252 Cramp and spasm: Secondary | ICD-10-CM | POA: Diagnosis not present

## 2021-01-24 DIAGNOSIS — I953 Hypotension of hemodialysis: Secondary | ICD-10-CM | POA: Diagnosis not present

## 2021-01-24 DIAGNOSIS — Z992 Dependence on renal dialysis: Secondary | ICD-10-CM | POA: Diagnosis not present

## 2021-01-25 ENCOUNTER — Telehealth: Payer: Self-pay | Admitting: *Deleted

## 2021-01-25 ENCOUNTER — Encounter: Payer: Self-pay | Admitting: Podiatry

## 2021-01-25 DIAGNOSIS — R252 Cramp and spasm: Secondary | ICD-10-CM | POA: Diagnosis not present

## 2021-01-25 DIAGNOSIS — N186 End stage renal disease: Secondary | ICD-10-CM | POA: Diagnosis not present

## 2021-01-25 DIAGNOSIS — I953 Hypotension of hemodialysis: Secondary | ICD-10-CM | POA: Diagnosis not present

## 2021-01-25 DIAGNOSIS — Z992 Dependence on renal dialysis: Secondary | ICD-10-CM | POA: Diagnosis not present

## 2021-01-25 DIAGNOSIS — N2581 Secondary hyperparathyroidism of renal origin: Secondary | ICD-10-CM | POA: Diagnosis not present

## 2021-01-25 NOTE — Progress Notes (Signed)
  Subjective:  Patient ID: Duane Hall, male    DOB: [birth year],  MRN: [id number]  Chief Complaint  Patient presents with  . Wound Check    Wound check. PT stated that he doesn't really have any pain and no major concerns at this time.     DOS: 01/12/2021 Procedure: Partial first ray amputation right foot  65 y.o. male returns for post-op check.  Feeling okay.  Still applying Santyl to the left heel which has been improving  Review of Systems: Negative except as noted in the HPI. Denies N/V/F/Ch.   Objective:  There were no vitals filed for this visit. There is no height or weight on file to calculate BMI. Constitutional Well developed. Well nourished.  Vascular  left foot is warm and well-perfused, the right foot is less warm distally.  Decreased capillary refill at the toes.  Neurologic Normal speech. Oriented to person, place, and time. Epicritic sensation to light touch grossly absent bilaterally.  Dermatologic  left heel ulceration continuing to improve in size and character.  Incision over right first ray has some early necrosis of the margins distally, there is blistering with serous drainage around the incision.  Appears to be healing better in the proximal portion  Orthopedic:  Minimal tenderness to palpation noted about the surgical site.   Assessment:  No diagnosis found. Plan:  Patient was evaluated and treated and all questions answered.  S/p foot surgery right -Begin every other day dressing changes with Betadine paint.  I redressed the foot today with povidone ointment and dry sterile gauze dressings -Chance of saving the rest of the foot and his overall limb salvage prognosis on the right side remains guarded.  I discussed this with him and his wife.  We will have to monitor this closely to see if is able to heal or not.  Unclear at this point if it does not heal that he would be able to heal a TMA. -Continue Santyl to left heel, it is nearly fully healed.  He  may begin to put more weight on this so that we can offload the right side  Return in about 1 week (around 01/30/2021) for wound re-check.

## 2021-01-26 ENCOUNTER — Ambulatory Visit: Payer: BC Managed Care – PPO | Admitting: Podiatry

## 2021-01-28 DIAGNOSIS — N2581 Secondary hyperparathyroidism of renal origin: Secondary | ICD-10-CM | POA: Diagnosis not present

## 2021-01-28 DIAGNOSIS — N186 End stage renal disease: Secondary | ICD-10-CM | POA: Diagnosis not present

## 2021-01-28 DIAGNOSIS — R252 Cramp and spasm: Secondary | ICD-10-CM | POA: Diagnosis not present

## 2021-01-28 DIAGNOSIS — I953 Hypotension of hemodialysis: Secondary | ICD-10-CM | POA: Diagnosis not present

## 2021-01-28 DIAGNOSIS — Z992 Dependence on renal dialysis: Secondary | ICD-10-CM | POA: Diagnosis not present

## 2021-01-29 DIAGNOSIS — R252 Cramp and spasm: Secondary | ICD-10-CM | POA: Diagnosis not present

## 2021-01-29 DIAGNOSIS — E1129 Type 2 diabetes mellitus with other diabetic kidney complication: Secondary | ICD-10-CM | POA: Diagnosis not present

## 2021-01-29 DIAGNOSIS — N186 End stage renal disease: Secondary | ICD-10-CM | POA: Diagnosis not present

## 2021-01-29 DIAGNOSIS — N2581 Secondary hyperparathyroidism of renal origin: Secondary | ICD-10-CM | POA: Diagnosis not present

## 2021-01-29 DIAGNOSIS — I953 Hypotension of hemodialysis: Secondary | ICD-10-CM | POA: Diagnosis not present

## 2021-01-29 DIAGNOSIS — Z992 Dependence on renal dialysis: Secondary | ICD-10-CM | POA: Diagnosis not present

## 2021-01-30 ENCOUNTER — Ambulatory Visit: Payer: BC Managed Care – PPO | Admitting: Podiatry

## 2021-01-30 ENCOUNTER — Other Ambulatory Visit: Payer: Self-pay | Admitting: *Deleted

## 2021-01-30 ENCOUNTER — Encounter: Payer: Self-pay | Admitting: Podiatry

## 2021-01-30 ENCOUNTER — Other Ambulatory Visit: Payer: Self-pay

## 2021-01-30 DIAGNOSIS — I739 Peripheral vascular disease, unspecified: Secondary | ICD-10-CM

## 2021-01-30 DIAGNOSIS — L89623 Pressure ulcer of left heel, stage 3: Secondary | ICD-10-CM

## 2021-01-30 DIAGNOSIS — N186 End stage renal disease: Secondary | ICD-10-CM

## 2021-01-30 DIAGNOSIS — Z89411 Acquired absence of right great toe: Secondary | ICD-10-CM

## 2021-01-30 DIAGNOSIS — Z992 Dependence on renal dialysis: Secondary | ICD-10-CM

## 2021-01-30 DIAGNOSIS — E1169 Type 2 diabetes mellitus with other specified complication: Secondary | ICD-10-CM

## 2021-01-30 DIAGNOSIS — E1122 Type 2 diabetes mellitus with diabetic chronic kidney disease: Secondary | ICD-10-CM

## 2021-01-30 DIAGNOSIS — F17200 Nicotine dependence, unspecified, uncomplicated: Secondary | ICD-10-CM

## 2021-01-30 MED ORDER — OMNIPOD DASH PODS (GEN 4) MISC: 3 refills | Status: DC

## 2021-01-30 MED ORDER — INSULIN ASPART 100 UNIT/ML ~~LOC~~ SOLN
SUBCUTANEOUS | 3 refills | Status: DC
Start: 2021-01-30 — End: 2021-02-01

## 2021-01-30 NOTE — Telephone Encounter (Signed)
Was sent earlier today

## 2021-01-30 NOTE — Telephone Encounter (Signed)
Patient requests to be called at ph# [phone] re: Status of OmniPod Dash and more urgently the Rx for Novolog that Patient has been waiting for (for at least 2 weeks and has not been sent to [facility] per Patient). Patient requests a RX for Novolog be sent asap to [id number] - [place], [place] - [address] Phone:  [phone]  Fax:  [phone]

## 2021-01-30 NOTE — Telephone Encounter (Signed)
Please send the omnipod dash to [facility] pharmacy requested by pt

## 2021-01-30 NOTE — Telephone Encounter (Signed)
Pt medication is not on the list. Please advise

## 2021-01-30 NOTE — Progress Notes (Signed)
  Subjective:  Patient ID: Duane Hall, male    DOB: 22-Apr-1956,  MRN: [id number]  Chief Complaint  Patient presents with  . Wound Check    PT stated that they would like to know that they are going in the right direction of the healing process.    DOS: 01/12/2021 Procedure: Partial first ray amputation right foot  64 y.o. male returns for post-op check.  Feeling okay.  Left heel is doing well.  They are concerned about the progress of the right foot and where it is having.  Review of Systems: Negative except as noted in the HPI. Denies N/V/F/Ch.   Objective:  There were no vitals filed for this visit. There is no height or weight on file to calculate BMI. Constitutional Well developed. Well nourished.  Vascular  left foot is warm and well-perfused, the right foot is less warm distally.  Decreased capillary refill at the toes.  Neurologic Normal speech. Oriented to person, place, and time. Epicritic sensation to light touch grossly absent bilaterally.  Dermatologic  left heel ulceration is nearly completely healed.  Incision over right first ray has some worsening necrosis of the margins what appears to be now deeper dehiscence starting.  Orthopedic:  Minimal tenderness to palpation noted about the surgical site.   Assessment:   1. Pressure injury of left heel, stage 3 ([place])   2. PAD (peripheral artery disease) ([facility])   3. Current smoker   4. ESRD (end stage renal disease) ([place])   5. Type 2 diabetes mellitus with other specified complication, without long-term current use of insulin ([place])   6. History of partial ray amputation of first toe of right foot ([place])    Plan:  Patient was evaluated and treated and all questions answered.  S/p foot surgery right -Continue training with Betadine -Fortunately it seems like this is likely headed towards a wound dehiscence and failure.  He was given one more week and then decide if we should progress with a midfoot amputation versus  below-knee amputation. -Continue Santyl to left heel, it is nearly fully healed.  He may begin to put more weight on this so that we can offload the right side  Return in about 1 week (around 02/06/2021) for wound re-check.

## 2021-01-30 NOTE — Telephone Encounter (Signed)
Ok, I have sent a prescription to your pharmacy 

## 2021-01-31 DIAGNOSIS — N186 End stage renal disease: Secondary | ICD-10-CM | POA: Diagnosis not present

## 2021-01-31 DIAGNOSIS — I953 Hypotension of hemodialysis: Secondary | ICD-10-CM | POA: Diagnosis not present

## 2021-01-31 DIAGNOSIS — Z992 Dependence on renal dialysis: Secondary | ICD-10-CM | POA: Diagnosis not present

## 2021-01-31 DIAGNOSIS — R252 Cramp and spasm: Secondary | ICD-10-CM | POA: Diagnosis not present

## 2021-01-31 DIAGNOSIS — N2581 Secondary hyperparathyroidism of renal origin: Secondary | ICD-10-CM | POA: Diagnosis not present

## 2021-02-01 ENCOUNTER — Other Ambulatory Visit: Payer: Self-pay | Admitting: Endocrinology

## 2021-02-01 DIAGNOSIS — Z992 Dependence on renal dialysis: Secondary | ICD-10-CM | POA: Diagnosis not present

## 2021-02-01 DIAGNOSIS — I953 Hypotension of hemodialysis: Secondary | ICD-10-CM | POA: Diagnosis not present

## 2021-02-01 DIAGNOSIS — N2581 Secondary hyperparathyroidism of renal origin: Secondary | ICD-10-CM | POA: Diagnosis not present

## 2021-02-01 DIAGNOSIS — R252 Cramp and spasm: Secondary | ICD-10-CM | POA: Diagnosis not present

## 2021-02-01 DIAGNOSIS — N186 End stage renal disease: Secondary | ICD-10-CM | POA: Diagnosis not present

## 2021-02-01 MED ORDER — INSULIN LISPRO 100 UNIT/ML ~~LOC~~ SOLN: SUBCUTANEOUS | 3 refills | Status: DC

## 2021-02-02 ENCOUNTER — Ambulatory Visit (INDEPENDENT_AMBULATORY_CARE_PROVIDER_SITE_OTHER): Payer: BC Managed Care – PPO | Admitting: Physician Assistant

## 2021-02-02 ENCOUNTER — Ambulatory Visit (INDEPENDENT_AMBULATORY_CARE_PROVIDER_SITE_OTHER)
Admission: RE | Admit: 2021-02-02 | Discharge: 2021-02-02 | Disposition: A | Discharge status: Home / Self Care | Payer: BC Managed Care – PPO | Source: Ambulatory Visit | Attending: Physician Assistant | Admitting: Physician Assistant

## 2021-02-02 ENCOUNTER — Ambulatory Visit (HOSPITAL_COMMUNITY)
Admission: RE | Admit: 2021-02-02 | Discharge: 2021-02-02 | Disposition: A | Discharge status: Home / Self Care | Payer: BC Managed Care – PPO | Source: Ambulatory Visit | Attending: Physician Assistant | Admitting: Physician Assistant

## 2021-02-02 ENCOUNTER — Other Ambulatory Visit: Payer: Self-pay

## 2021-02-02 VITALS — BP 98/55 | HR 80 | Temp 98.1°F | Resp 16 | Ht 69.0 in | Wt 167.5 lb

## 2021-02-02 DIAGNOSIS — I739 Peripheral vascular disease, unspecified: Secondary | ICD-10-CM | POA: Insufficient documentation

## 2021-02-02 DIAGNOSIS — N186 End stage renal disease: Secondary | ICD-10-CM | POA: Diagnosis not present

## 2021-02-02 DIAGNOSIS — N2581 Secondary hyperparathyroidism of renal origin: Secondary | ICD-10-CM | POA: Diagnosis not present

## 2021-02-02 DIAGNOSIS — R252 Cramp and spasm: Secondary | ICD-10-CM | POA: Diagnosis not present

## 2021-02-02 DIAGNOSIS — I953 Hypotension of hemodialysis: Secondary | ICD-10-CM | POA: Diagnosis not present

## 2021-02-02 DIAGNOSIS — Z992 Dependence on renal dialysis: Secondary | ICD-10-CM | POA: Diagnosis not present

## 2021-02-02 NOTE — Progress Notes (Signed)
Postoperative Visit ([facility])   History of Present Illness   Duane Hall is a 65 y.o. male who presents status post right SFA and PTA atherectomy and balloon angioplasty by Dr. Donzetta Matters on 12/25/2020.  He subsequently had first toe ray amputation by Dr. Sherryle Lis podiatrist on 01/12/2021.  He returns today for arterial duplex and ABI studies with his wife.  They have since seen the podiatrist who does not believe the amputation site is healing and patient will likely need an attempted TMA versus BKA.  Patient denies pain to the first toe amputation site.  He is on aspirin, Plavix, statin daily.  Past medical history also significant for insulin-dependent diabetes mellitus as well as end-stage renal disease on hemodialysis.     Current Outpatient Medications  Medication Sig Dispense Refill  . aspirin EC 81 MG EC tablet Take 1 tablet (81 mg total) by mouth daily. Swallow whole. (Patient taking differently: Take 81 mg by mouth every morning. Swallow whole.) 30 tablet 11  . clopidogrel (PLAVIX) 75 MG tablet Take 1 tablet (75 mg total) by mouth daily with breakfast. (Patient taking differently: Take 75 mg by mouth every morning.) 30 tablet 3  . collagenase (SANTYL) ointment Apply 1 application topically See admin instructions. Apply to the left heel daily with a layer the thickness of a nickel 30 g 2  . Continuous Blood Gluc Sensor (FREESTYLE LIBRE 14 DAY SENSOR) MISC 1 Device by Other route every 14 (fourteen) days. 6 each 0  . docusate sodium (COLACE) 100 MG capsule Take 100 mg by mouth every morning.    . ferric citrate (AURYXIA) 1 GM 210 MG(Fe) tablet Take 210 mg by mouth 2 (two) times daily with a meal.    . gabapentin (NEURONTIN) 100 MG capsule Take 300 mg by mouth every morning.    . Glucagon (GVOKE HYPOPEN 1-PACK) 1 MG/0.2ML SOAJ Inject 1 mg into the skin once as needed (low blood sugar).    . Insulin Disposable Pump (OMNIPOD DASH 5 PACK PODS) MISC Use as instructed: use pod every 3 days. 30  each 3  . insulin lispro (HUMALOG) 100 UNIT/ML injection For use in pump, for a total of 20 units per day. 30 mL 3  . Insulin Pen Needle (PEN NEEDLES) 31G X 6 MM MISC 1 Device by Does not apply route in the morning, at noon, in the evening, and at bedtime. 360 each 3  . iron sucrose in sodium chloride 0.9 % 100 mL Iron Sucrose (Venofer)    . lidocaine-prilocaine (EMLA) cream Apply 1 application topically See admin instructions. Apply topically to port access one hour prior to dialysis on Sunday, Monday, Wednesday, Thursday    . Methoxy PEG-Epoetin Beta (MIRCERA IJ) Inject into the skin every 30 (thirty) days.    . multivitamin (RENA-VIT) TABS tablet Take 1 tablet by mouth every morning.    . mupirocin ointment (BACTROBAN) 2 % Apply 1 application topically 2 (two) times daily. 15 g 2  . omeprazole (PRILOSEC) 40 MG capsule Take 40 mg by mouth every morning.    . rosuvastatin (CRESTOR) 10 MG tablet TAKE 1 TABLET BY MOUTH DAILY (Patient taking differently: Take 10 mg by mouth every morning.) 30 tablet 11   Current Facility-Administered Medications  Medication Dose Route Frequency Provider Last Rate Last Admin  . 0.9 %  sodium chloride infusion  250 mL Intravenous PRN Waynetta Sandy, MD      . 0.9 %  sodium chloride infusion  250 mL  Intravenous PRN Waynetta Sandy, MD      . sodium chloride flush (NS) 0.9 % injection 3 mL  3 mL Intravenous Q12H Waynetta Sandy, MD        ROS: 10 system ROS otherwise negative   For VQI Use Only   PRE-ADM LIVING: Home  AMB STATUS: Wheelchair   Physical Examination   Vitals:   02/02/21 1042  BP: (!) 98/55  Pulse: 80  Resp: 16  Temp: 98.1 F (36.7 C)  TempSrc: Temporal  SpO2: 99%  Weight: 167 lb 8.8 oz (76 kg)  Height: 5\' 9"  (1.753 m)   Body mass index is 24.74 kg/m.  General Alert, O x 3, WD, NAD  Pulmonary Sym exp, good B air movt,  Cardiac RRR, Nl S1, S2,   Vascular Vessel Right Left  Radial Palpable Palpable   PT Palpable Not palpable  DP Not palpable Not palpable    Musculoskeletal M/S 5/5 throughout  , Local edema with surrounding erythema and nonviable skin edges of first ray amputation incision  Neurologic  Pain and light touch intact in extremities , Motor exam as listed above    Medical Decision Making   Duane Hall is a 65 y.o. male who presents s/p right SFA and PTA atherectomy with subsequent first ray amputation   Despite some areas of restenosis and SFA and PTA on arterial duplex patient has a palpable PT pulse; no further revascularization options per Dr. Donzetta Matters  Right first toe amputation site does not seem to be healing; patient has plans to return to his podiatrist next week for decision on attempted TMA versus BKA  If patient would prefer BKA after discussion, he can call the office and we will put patient on the OR schedule without a return appointment.  Wound was dressed with Betadine, gauze, and Kerlix today   Dagoberto Ligas PA-C [facility] Office: [phone]  Clinic MD: Donzetta Matters was involved in the evaluation and management plan of this patient

## 2021-02-04 DIAGNOSIS — R252 Cramp and spasm: Secondary | ICD-10-CM | POA: Diagnosis not present

## 2021-02-04 DIAGNOSIS — I953 Hypotension of hemodialysis: Secondary | ICD-10-CM | POA: Diagnosis not present

## 2021-02-04 DIAGNOSIS — N186 End stage renal disease: Secondary | ICD-10-CM | POA: Diagnosis not present

## 2021-02-04 DIAGNOSIS — N2581 Secondary hyperparathyroidism of renal origin: Secondary | ICD-10-CM | POA: Diagnosis not present

## 2021-02-04 DIAGNOSIS — Z992 Dependence on renal dialysis: Secondary | ICD-10-CM | POA: Diagnosis not present

## 2021-02-05 ENCOUNTER — Other Ambulatory Visit: Payer: Self-pay | Admitting: Endocrinology

## 2021-02-05 DIAGNOSIS — I953 Hypotension of hemodialysis: Secondary | ICD-10-CM | POA: Diagnosis not present

## 2021-02-05 DIAGNOSIS — Z992 Dependence on renal dialysis: Secondary | ICD-10-CM | POA: Diagnosis not present

## 2021-02-05 DIAGNOSIS — R252 Cramp and spasm: Secondary | ICD-10-CM | POA: Diagnosis not present

## 2021-02-05 DIAGNOSIS — N186 End stage renal disease: Secondary | ICD-10-CM | POA: Diagnosis not present

## 2021-02-05 DIAGNOSIS — N2581 Secondary hyperparathyroidism of renal origin: Secondary | ICD-10-CM | POA: Diagnosis not present

## 2021-02-05 MED ORDER — INSULIN ASPART 100 UNIT/ML ~~LOC~~ SOLN: SUBCUTANEOUS | 99 refills | Status: DC

## 2021-02-06 ENCOUNTER — Other Ambulatory Visit: Payer: Self-pay

## 2021-02-06 ENCOUNTER — Ambulatory Visit: Payer: BC Managed Care – PPO | Admitting: Podiatry

## 2021-02-06 DIAGNOSIS — R252 Cramp and spasm: Secondary | ICD-10-CM | POA: Diagnosis not present

## 2021-02-06 DIAGNOSIS — N2581 Secondary hyperparathyroidism of renal origin: Secondary | ICD-10-CM | POA: Diagnosis not present

## 2021-02-06 DIAGNOSIS — N186 End stage renal disease: Secondary | ICD-10-CM | POA: Diagnosis not present

## 2021-02-06 DIAGNOSIS — Z992 Dependence on renal dialysis: Secondary | ICD-10-CM | POA: Diagnosis not present

## 2021-02-06 DIAGNOSIS — I739 Peripheral vascular disease, unspecified: Secondary | ICD-10-CM

## 2021-02-06 DIAGNOSIS — F17200 Nicotine dependence, unspecified, uncomplicated: Secondary | ICD-10-CM

## 2021-02-06 DIAGNOSIS — Z89411 Acquired absence of right great toe: Secondary | ICD-10-CM

## 2021-02-06 DIAGNOSIS — I953 Hypotension of hemodialysis: Secondary | ICD-10-CM | POA: Diagnosis not present

## 2021-02-06 DIAGNOSIS — E1169 Type 2 diabetes mellitus with other specified complication: Secondary | ICD-10-CM

## 2021-02-06 DIAGNOSIS — L89623 Pressure ulcer of left heel, stage 3: Secondary | ICD-10-CM

## 2021-02-07 ENCOUNTER — Encounter: Payer: Self-pay | Admitting: Podiatry

## 2021-02-07 NOTE — Progress Notes (Signed)
  Subjective:  Patient ID: Duane Hall, male    DOB: Mar 04, 1956,  MRN: [id number]  Chief Complaint  Patient presents with  . Wound Check    PT is concerned about what the next steps will be     DOS: 01/12/2021 Procedure: Partial first ray amputation right foot  65 y.o. male returns for post-op check.  Feeling okay.  Left heel is healed.  No significant change in the right foot  Review of Systems: Negative except as noted in the HPI. Denies N/V/F/Ch.   Objective:  There were no vitals filed for this visit. There is no height or weight on file to calculate BMI. Constitutional Well developed. Well nourished.  Vascular  left foot is warm and well-perfused, the right foot is less warm distally.  Mild edema on the right foot decreased capillary refill at the toes.  Neurologic Normal speech. Oriented to person, place, and time. Epicritic sensation to light touch grossly absent bilaterally.  Dermatologic  left heel ulceration is fully and completely healed.  Right foot incisional necrosis appears to be stable deep layers are holding.  Less maceration and drainage today.  Orthopedic:  Minimal tenderness to palpation noted about the surgical site.   Assessment:   No diagnosis found. Plan:  Patient was evaluated and treated and all questions answered.  S/p foot surgery right -Incision on the right foot appears to be stable.  We will leave sutures intact for now.  Possibly we could allow this to heal secondarily, his perfusion appears to be better today.  I do not think we should proceed to amputation at this point but will be weeks to week monitoring and decision making.  Continue Betadine dressings at home -Can return to regular shoe gear and bracing on the left side as it is healed  No follow-ups on file.

## 2021-02-08 DIAGNOSIS — N2581 Secondary hyperparathyroidism of renal origin: Secondary | ICD-10-CM | POA: Diagnosis not present

## 2021-02-08 DIAGNOSIS — N186 End stage renal disease: Secondary | ICD-10-CM | POA: Diagnosis not present

## 2021-02-08 DIAGNOSIS — R252 Cramp and spasm: Secondary | ICD-10-CM | POA: Diagnosis not present

## 2021-02-08 DIAGNOSIS — Z992 Dependence on renal dialysis: Secondary | ICD-10-CM | POA: Diagnosis not present

## 2021-02-08 DIAGNOSIS — I953 Hypotension of hemodialysis: Secondary | ICD-10-CM | POA: Diagnosis not present

## 2021-02-09 ENCOUNTER — Ambulatory Visit (INDEPENDENT_AMBULATORY_CARE_PROVIDER_SITE_OTHER): Payer: BC Managed Care – PPO | Admitting: Endocrinology

## 2021-02-09 ENCOUNTER — Encounter: Payer: Self-pay | Admitting: Endocrinology

## 2021-02-09 ENCOUNTER — Other Ambulatory Visit: Payer: Self-pay

## 2021-02-09 VITALS — BP 120/84 | HR 84 | Ht 69.0 in | Wt 173.2 lb

## 2021-02-09 DIAGNOSIS — E1122 Type 2 diabetes mellitus with diabetic chronic kidney disease: Secondary | ICD-10-CM

## 2021-02-09 DIAGNOSIS — N2581 Secondary hyperparathyroidism of renal origin: Secondary | ICD-10-CM | POA: Diagnosis not present

## 2021-02-09 DIAGNOSIS — R252 Cramp and spasm: Secondary | ICD-10-CM | POA: Diagnosis not present

## 2021-02-09 DIAGNOSIS — N186 End stage renal disease: Secondary | ICD-10-CM | POA: Diagnosis not present

## 2021-02-09 DIAGNOSIS — I953 Hypotension of hemodialysis: Secondary | ICD-10-CM | POA: Diagnosis not present

## 2021-02-09 DIAGNOSIS — Z992 Dependence on renal dialysis: Secondary | ICD-10-CM

## 2021-02-09 LAB — POCT GLYCOSYLATED HEMOGLOBIN (HGB A1C): Hemoglobin A1C: 8.9 % — AB (ref 4.0–5.6)

## 2021-02-09 MED ORDER — FIASP 100 UNIT/ML ~~LOC~~ SOLN: SUBCUTANEOUS | 3 refills | Status: DC

## 2021-02-09 NOTE — Patient Instructions (Addendum)
check your blood sugar 4 times a day: before the 3 meals, and at bedtime.  also check if you have symptoms of your blood sugar being too high or too low.  please keep a record of the readings and bring it to your next appointment here (or you can bring the meter itself).  You can write it on any piece of paper.  please call us sooner if your blood sugar goes below 70, or if you have a lot of readings over 200.  Please take these settings: basal rate of 2 units/HR, 2PM-8PM, and 0.05 units/hr other times.   bolus of 1 unit/16 grams carbohydrate.   correction bolus (which some people call "sensitivity," or "insulin sensitivity ratio," or just "isr") of 1 unit for each 100 by which your glucose exceeds 120.   We will need to take this complex situation in stages.     Please come back for a follow-up appointment in 3-4 weeks, by video if you prefer.

## 2021-02-09 NOTE — Progress Notes (Signed)
Subjective:    Patient ID: Duane Hall, male    DOB: 1956-10-10, 65 y.o.   MRN: [id number]  HPI Pt returns for f/u of diabetes mellitus: DM type: Insulin-requiring type 2, but he is at risk for evolving type 1.  Dx'ed: 8250 Complications: ESRD, foot ulcers, GP, PAD, and PDR.   Therapy: insulin since 2021 (Omnipod Dash since 1/22) DKA: never Severe hypoglycemia: never.  Pancreatitis: never Pancreatic imaging:  SDOH: Wife provides most hx, due to pt's poor overall health; he eats meals at 10AM, (sometimes 2PM), and 6PM; He eats small breakfast and lunch, and a larger dinner.  Other: fructosamine converts to higher avg glucose than A1c, prob due to renal failure; he has [place] continuous glucose monitor; he stopped Trulicity, due to GP; he eats meals at 10AM, 1PM, and 6PM.  He often skips lunch; he has very long duration of action of insulin  Interval history: He started Omnipods, with these settings:  basal rate of 0.1 units/hr, 24HRS per day. bolus of 1 unit/10 grams carbohydrate correction bolus (which some people call "sensitivity," or "insulin sensitivity ratio," or just "isr") of 1 unit for each 100 by which your glucose exceeds 120.   TDD is 17 units (86% boluses)   I reviewed continuous glucose monitor data.  Glucose varies from 150-400.  It increases from 3PM to 12MN, then it decreases again to 3PM.  He seldom has hypoglycemia, and these episodes are mild.  He eats 2 meals per day.  He takes 2.8 boluses per day.   Past Medical History:  Diagnosis Date  . Asthma   . Cancer ([place])   . Cataract   . CKD (chronic kidney disease), stage III ([place])   . Diabetes mellitus   . Glaucoma   . Vascular insufficiency 05/2020    Past Surgical History:  Procedure Laterality Date  . ABDOMINAL AORTOGRAM W/LOWER EXTREMITY N/A 06/19/2020   Procedure: ABDOMINAL AORTOGRAM W/LOWER EXTREMITY;  Surgeon: Waynetta Sandy, MD;  Location: [facility];  Service: Cardiovascular;   Laterality: N/A;  . ABDOMINAL AORTOGRAM W/LOWER EXTREMITY Left 10/16/2020   Procedure: ABDOMINAL AORTOGRAM W/LOWER EXTREMITY;  Surgeon: Waynetta Sandy, MD;  Location: [facility];  Service: Cardiovascular;  Laterality: Left;  . ABDOMINAL AORTOGRAM W/LOWER EXTREMITY N/A 12/25/2020   Procedure: ABDOMINAL AORTOGRAM W/LOWER EXTREMITY;  Surgeon: Waynetta Sandy, MD;  Location: [facility];  Service: Cardiovascular;  Laterality: N/A;  . AMPUTATION Right 01/12/2021   Procedure: 1ST AMPUTATION RAY;  Surgeon: [name], DPM;  Location: [facility];  Service: Podiatry;  Laterality: Right;  . AV FISTULA PLACEMENT Right 09/14/2019   Procedure: Right Arm Basilic Vein transposition;  Surgeon: Angelia Mould, MD;  Location: [facility];  Service: Vascular;  Laterality: Right;  . BONE BIOPSY Left 07/29/2020   Procedure: BONE BIOPSY;  Surgeon: [name], DPM;  Location: [facility];  Service: Podiatry;  Laterality: Left;  Need bone trephines and/or large bore Giamshidi  . COLONOSCOPY    . PERIPHERAL VASCULAR ATHERECTOMY Left 06/19/2020   Procedure: PERIPHERAL VASCULAR ATHERECTOMY;  Surgeon: Waynetta Sandy, MD;  Location: [facility];  Service: Cardiovascular;  Laterality: Left;  SFA  . PERIPHERAL VASCULAR ATHERECTOMY  12/25/2020   Procedure: PERIPHERAL VASCULAR ATHERECTOMY;  Surgeon: Waynetta Sandy, MD;  Location: [facility];  Service: Cardiovascular;;  Lt. PT - Laser Lt. SFA - Laser  . PERIPHERAL VASCULAR BALLOON ANGIOPLASTY  12/25/2020   Procedure: PERIPHERAL VASCULAR  BALLOON ANGIOPLASTY;  Surgeon: Waynetta Sandy, MD;  Location: [facility];  Service: Cardiovascular;;  Lt. SFA and PT  . UPPER GASTROINTESTINAL ENDOSCOPY  02/2019   Dr Benson Norway      Social History   Socioeconomic History  . Marital status: Married    Spouse name: Not on file  . Number of children: Not on file  . Years of education: Not on file  .  Highest education level: Not on file  Occupational History  . Occupation: Chief Financial Officer  Tobacco Use  . Smoking status: Current Every Day Smoker    Packs/day: 0.30    Types: Cigarettes  . Smokeless tobacco: Never Used  Vaping Use  . Vaping Use: Never used  Substance and Sexual Activity  . Alcohol use: No    Alcohol/week: 0.0 standard drinks  . Drug use: No  . Sexual activity: Not on file  Other Topics Concern  . Not on file  Social History Narrative   Married.    Social Determinants of Health   Financial Resource Strain: Not on file  Food Insecurity: Not on file  Transportation Needs: Not on file  Physical Activity: Not on file  Stress: Not on file  Social Connections: Not on file  Intimate Partner Violence: Not on file    Current Outpatient Medications on File Prior to Visit  Medication Sig Dispense Refill  . aspirin EC 81 MG EC tablet Take 1 tablet (81 mg total) by mouth daily. Swallow whole. (Patient taking differently: Take 81 mg by mouth every morning. Swallow whole.) 30 tablet 11  . clopidogrel (PLAVIX) 75 MG tablet Take 1 tablet (75 mg total) by mouth daily with breakfast. (Patient taking differently: Take 75 mg by mouth every morning.) 30 tablet 3  . collagenase (SANTYL) ointment Apply 1 application topically See admin instructions. Apply to the left heel daily with a layer the thickness of a nickel 30 g 2  . Continuous Blood Gluc Sensor (FREESTYLE LIBRE 14 DAY SENSOR) MISC 1 Device by Other route every 14 (fourteen) days. 6 each 0  . docusate sodium (COLACE) 100 MG capsule Take 100 mg by mouth every morning.    . ferric citrate (AURYXIA) 1 GM 210 MG(Fe) tablet Take 210 mg by mouth 2 (two) times daily with a meal.    . gabapentin (NEURONTIN) 100 MG capsule Take 300 mg by mouth every morning.    . Glucagon (GVOKE HYPOPEN 1-PACK) 1 MG/0.2ML SOAJ Inject 1 mg into the skin once as needed (low blood sugar).    . Insulin Disposable Pump (OMNIPOD DASH 5 PACK PODS) MISC Use as  instructed: use pod every 3 days. 30 each 3  . Insulin Pen Needle (PEN NEEDLES) 31G X 6 MM MISC 1 Device by Does not apply route in the morning, at noon, in the evening, and at bedtime. 360 each 3  . iron sucrose in sodium chloride 0.9 % 100 mL Iron Sucrose (Venofer)    . lidocaine-prilocaine (EMLA) cream Apply 1 application topically See admin instructions. Apply topically to port access one hour prior to dialysis on Sunday, Monday, Wednesday, Thursday    . Methoxy PEG-Epoetin Beta (MIRCERA IJ) Inject into the skin every 30 (thirty) days.    . multivitamin (RENA-VIT) TABS tablet Take 1 tablet by mouth every morning.    . mupirocin ointment (BACTROBAN) 2 % Apply 1 application topically 2 (two) times daily. 15 g 2  . omeprazole (PRILOSEC) 40 MG capsule Take 40 mg by mouth every morning.    Marland Kitchen  rosuvastatin (CRESTOR) 10 MG tablet TAKE 1 TABLET BY MOUTH DAILY (Patient taking differently: Take 10 mg by mouth every morning.) 30 tablet 11   Current Facility-Administered Medications on File Prior to Visit  Medication Dose Route Frequency Provider Last Rate Last Admin  . 0.9 %  sodium chloride infusion  250 mL Intravenous PRN Waynetta Sandy, MD      . 0.9 %  sodium chloride infusion  250 mL Intravenous PRN Waynetta Sandy, MD      . sodium chloride flush (NS) 0.9 % injection 3 mL  3 mL Intravenous Q12H Waynetta Sandy, MD        Allergies  Allergen Reactions  . Amoxicillin-Pot Clavulanate Diarrhea and Other (See Comments)  . Midodrine Other (See Comments)    Other reaction(s): Urinary Sensation  . Tape Other (See Comments)    bandaids cause blistering    Family History  Problem Relation Age of Onset  . Cancer Father   . Diabetes Mother     BP 120/84 (BP Location: Right Arm, Patient Position: Sitting, Cuff Size: Normal)   Pulse 84   Ht 5\' 9"  (1.753 m)   Wt 173 lb 3.2 oz (78.6 kg)   SpO2 95%   BMI 25.58 kg/m    Review of Systems     Objective:    Physical Exam VITAL SIGNS:  See vs page GENERAL: no distress    A1c=8.9%    Assessment & Plan:  Insulin-requiring type 2 DM, with ESRD: uncontrolled.  Hypoglycemia, due to insulin: this limits aggressiveness of glycemic control  Patient Instructions  check your blood sugar 4 times a day: before the 3 meals, and at bedtime.  also check if you have symptoms of your blood sugar being too high or too low.  please keep a record of the readings and bring it to your next appointment here (or you can bring the meter itself).  You can write it on any piece of paper.  please call us sooner if your blood sugar goes below 70, or if you have a lot of readings over 200.  Please take these settings: basal rate of 2 units/HR, 2PM-8PM, and 0.05 units/hr other times.   bolus of 1 unit/16 grams carbohydrate.   correction bolus (which some people call "sensitivity," or "insulin sensitivity ratio," or just "isr") of 1 unit for each 100 by which your glucose exceeds 120.   We will need to take this complex situation in stages.     Please come back for a follow-up appointment in 3-4 weeks, by video if you prefer.

## 2021-02-10 ENCOUNTER — Telehealth: Payer: Self-pay

## 2021-02-10 NOTE — Telephone Encounter (Signed)
Spoke with Pt's wife Belenda Cruise to let her know that Loanne Drilling changed his medication to [place].

## 2021-02-11 DIAGNOSIS — N2581 Secondary hyperparathyroidism of renal origin: Secondary | ICD-10-CM | POA: Diagnosis not present

## 2021-02-11 DIAGNOSIS — N186 End stage renal disease: Secondary | ICD-10-CM | POA: Diagnosis not present

## 2021-02-11 DIAGNOSIS — I953 Hypotension of hemodialysis: Secondary | ICD-10-CM | POA: Diagnosis not present

## 2021-02-11 DIAGNOSIS — Z992 Dependence on renal dialysis: Secondary | ICD-10-CM | POA: Diagnosis not present

## 2021-02-11 DIAGNOSIS — R252 Cramp and spasm: Secondary | ICD-10-CM | POA: Diagnosis not present

## 2021-02-12 ENCOUNTER — Other Ambulatory Visit: Payer: Self-pay | Admitting: Endocrinology

## 2021-02-12 DIAGNOSIS — Z992 Dependence on renal dialysis: Secondary | ICD-10-CM | POA: Diagnosis not present

## 2021-02-12 DIAGNOSIS — R252 Cramp and spasm: Secondary | ICD-10-CM | POA: Diagnosis not present

## 2021-02-12 DIAGNOSIS — E1122 Type 2 diabetes mellitus with diabetic chronic kidney disease: Secondary | ICD-10-CM

## 2021-02-12 DIAGNOSIS — N2581 Secondary hyperparathyroidism of renal origin: Secondary | ICD-10-CM | POA: Diagnosis not present

## 2021-02-12 DIAGNOSIS — N186 End stage renal disease: Secondary | ICD-10-CM

## 2021-02-12 DIAGNOSIS — I953 Hypotension of hemodialysis: Secondary | ICD-10-CM | POA: Diagnosis not present

## 2021-02-12 MED ORDER — FREESTYLE LIBRE 14 DAY SENSOR MISC
1.0000 | 3 refills | Status: DC
Start: 2021-02-12 — End: 2021-03-26

## 2021-02-13 ENCOUNTER — Ambulatory Visit: Payer: BC Managed Care – PPO

## 2021-02-13 ENCOUNTER — Encounter: Payer: Self-pay | Admitting: Podiatry

## 2021-02-13 ENCOUNTER — Encounter: Payer: Self-pay | Admitting: Endocrinology

## 2021-02-13 ENCOUNTER — Other Ambulatory Visit: Payer: Self-pay

## 2021-02-13 ENCOUNTER — Encounter (HOSPITAL_COMMUNITY): Payer: BC Managed Care – PPO

## 2021-02-13 ENCOUNTER — Ambulatory Visit: Payer: BC Managed Care – PPO | Admitting: Podiatry

## 2021-02-13 DIAGNOSIS — N186 End stage renal disease: Secondary | ICD-10-CM | POA: Diagnosis not present

## 2021-02-13 DIAGNOSIS — I953 Hypotension of hemodialysis: Secondary | ICD-10-CM | POA: Diagnosis not present

## 2021-02-13 DIAGNOSIS — Z992 Dependence on renal dialysis: Secondary | ICD-10-CM | POA: Diagnosis not present

## 2021-02-13 DIAGNOSIS — E1151 Type 2 diabetes mellitus with diabetic peripheral angiopathy without gangrene: Secondary | ICD-10-CM | POA: Diagnosis not present

## 2021-02-13 DIAGNOSIS — I739 Peripheral vascular disease, unspecified: Secondary | ICD-10-CM

## 2021-02-13 DIAGNOSIS — R252 Cramp and spasm: Secondary | ICD-10-CM | POA: Diagnosis not present

## 2021-02-13 DIAGNOSIS — Z89411 Acquired absence of right great toe: Secondary | ICD-10-CM

## 2021-02-13 DIAGNOSIS — F17200 Nicotine dependence, unspecified, uncomplicated: Secondary | ICD-10-CM

## 2021-02-13 DIAGNOSIS — E1169 Type 2 diabetes mellitus with other specified complication: Secondary | ICD-10-CM

## 2021-02-13 DIAGNOSIS — K219 Gastro-esophageal reflux disease without esophagitis: Secondary | ICD-10-CM | POA: Diagnosis not present

## 2021-02-13 DIAGNOSIS — N2581 Secondary hyperparathyroidism of renal origin: Secondary | ICD-10-CM | POA: Diagnosis not present

## 2021-02-13 DIAGNOSIS — E11621 Type 2 diabetes mellitus with foot ulcer: Secondary | ICD-10-CM | POA: Diagnosis not present

## 2021-02-13 DIAGNOSIS — L97529 Non-pressure chronic ulcer of other part of left foot with unspecified severity: Secondary | ICD-10-CM | POA: Diagnosis not present

## 2021-02-13 NOTE — Progress Notes (Signed)
  Subjective:  Patient ID: Duane Hall, male    DOB: [birth year],  MRN: [id number]  Chief Complaint  Patient presents with  . Wound Check    PT stated that he is doing okay he has no concerns at this time    DOS: 01/12/2021 Procedure: Partial first ray amputation right foot  65 y.o. male returns for post-op check.  Not much different since last visit  Review of Systems: Negative except as noted in the HPI. Denies N/V/F/Ch.   Objective:  There were no vitals filed for this visit. There is no height or weight on file to calculate BMI. Constitutional Well developed. Well nourished.  Vascular  left foot is warm and well-perfused, the right foot is less warm distally.  Mild edema on the right foot decreased capillary refill at the toes.  Neurologic Normal speech. Oriented to person, place, and time. Epicritic sensation to light touch grossly absent bilaterally.  Dermatologic  left heel ulceration is fully and completely healed.  Right foot incisional necrosis appears to be stable deep layers are holding.  No maceration and drainage today.  Orthopedic:  Minimal tenderness to palpation noted about the surgical site.   Assessment:   1. PAD (peripheral artery disease) ([place])   2. Current smoker   3. ESRD (end stage renal disease) ([place])   4. Type 2 diabetes mellitus with other specified complication, without long-term current use of insulin ([place])   5. History of partial ray amputation of first toe of right foot ([place])    Plan:  Patient was evaluated and treated and all questions answered.  S/p foot surgery right -Continues to be weak to be.  Seems to be stable.  I think I minimize Betadine on the incision itself but has been very dry now.  We should begin applying Santyl to see if this will debride the nonviable tissue and see if we can heal this secondarily -Can return to regular shoe gear and bracing on the left side as it is healed  Return in about 2 weeks (around 02/27/2021) for  wound re-check.

## 2021-02-13 NOTE — Patient Instructions (Signed)
Begin applying a bit of betadine around the incision daily followed by a thin layer of santyl and a saline moistened wet to dry dressing  Monitor for any signs/symptoms of infection. Signs of an infection could be redness beyond the site of the incision/procedure/wound, foul smelling odor, drainage that is thick and yellow or green, or severe swelling and pain. Call the office immediately if any occur or go directly to the emergency room. Call with any questions/concerns.

## 2021-02-14 ENCOUNTER — Telehealth: Payer: Self-pay | Admitting: Nutrition

## 2021-02-14 NOTE — Telephone Encounter (Signed)
Pt. Called back and talked through making the changes to his basal rate from 2PM to 8PM to 2.2u/hr.

## 2021-02-14 NOTE — Telephone Encounter (Signed)
Please take these settings: basal rate of 2.2 units/HR, 2PM-8PM, and 0.05 units/hr other times.   bolus of 1 unit/16 grams carbohydrate.   correction bolus (which some people call "sensitivity," or "insulin sensitivity ratio," or just "isr") of 1 unit for each 100 by which your glucose exceeds 120.

## 2021-02-14 NOTE — Telephone Encounter (Signed)
Wife says since we are filling pod with 150u for 3 days, she will run out of insulin-1 vial /month Note to increase insulin script to 50u/day, or 16ml/month.

## 2021-02-14 NOTE — Telephone Encounter (Signed)
Wife says that husband changed pod yesterday afternoon and since then, blood sugars have been high.  Says they have switched to the [place] system and the OmniPod people helped them to transfer the settings.  I had Jeb read me his basal rates and they are correct:  MN-2PM: 0.05, 2PM: 8PM: 2.0, 8PM:-MN: 0.05.  I/C: 16, ISF: 100, timing: 6 hours.   Patient was told to pull the pod, to see if the blue cannula was bent.  It was not bent.   Patient denies nausea or vomiting Dr. Loanne Drilling: please advise

## 2021-02-15 DIAGNOSIS — R252 Cramp and spasm: Secondary | ICD-10-CM | POA: Diagnosis not present

## 2021-02-15 DIAGNOSIS — I953 Hypotension of hemodialysis: Secondary | ICD-10-CM | POA: Diagnosis not present

## 2021-02-15 DIAGNOSIS — N2581 Secondary hyperparathyroidism of renal origin: Secondary | ICD-10-CM | POA: Diagnosis not present

## 2021-02-15 DIAGNOSIS — Z992 Dependence on renal dialysis: Secondary | ICD-10-CM | POA: Diagnosis not present

## 2021-02-15 DIAGNOSIS — N186 End stage renal disease: Secondary | ICD-10-CM | POA: Diagnosis not present

## 2021-02-16 DIAGNOSIS — R252 Cramp and spasm: Secondary | ICD-10-CM | POA: Diagnosis not present

## 2021-02-16 DIAGNOSIS — N186 End stage renal disease: Secondary | ICD-10-CM | POA: Diagnosis not present

## 2021-02-16 DIAGNOSIS — I953 Hypotension of hemodialysis: Secondary | ICD-10-CM | POA: Diagnosis not present

## 2021-02-16 DIAGNOSIS — N2581 Secondary hyperparathyroidism of renal origin: Secondary | ICD-10-CM | POA: Diagnosis not present

## 2021-02-16 DIAGNOSIS — Z992 Dependence on renal dialysis: Secondary | ICD-10-CM | POA: Diagnosis not present

## 2021-02-18 DIAGNOSIS — N186 End stage renal disease: Secondary | ICD-10-CM | POA: Diagnosis not present

## 2021-02-18 DIAGNOSIS — I953 Hypotension of hemodialysis: Secondary | ICD-10-CM | POA: Diagnosis not present

## 2021-02-18 DIAGNOSIS — Z992 Dependence on renal dialysis: Secondary | ICD-10-CM | POA: Diagnosis not present

## 2021-02-18 DIAGNOSIS — R252 Cramp and spasm: Secondary | ICD-10-CM | POA: Diagnosis not present

## 2021-02-18 DIAGNOSIS — N2581 Secondary hyperparathyroidism of renal origin: Secondary | ICD-10-CM | POA: Diagnosis not present

## 2021-02-19 DIAGNOSIS — N186 End stage renal disease: Secondary | ICD-10-CM | POA: Diagnosis not present

## 2021-02-19 DIAGNOSIS — N2581 Secondary hyperparathyroidism of renal origin: Secondary | ICD-10-CM | POA: Diagnosis not present

## 2021-02-19 DIAGNOSIS — I953 Hypotension of hemodialysis: Secondary | ICD-10-CM | POA: Diagnosis not present

## 2021-02-19 DIAGNOSIS — Z992 Dependence on renal dialysis: Secondary | ICD-10-CM | POA: Diagnosis not present

## 2021-02-19 DIAGNOSIS — R252 Cramp and spasm: Secondary | ICD-10-CM | POA: Diagnosis not present

## 2021-02-20 ENCOUNTER — Encounter: Payer: Self-pay | Admitting: Endocrinology

## 2021-02-20 ENCOUNTER — Telehealth: Payer: Self-pay | Admitting: Nutrition

## 2021-02-20 DIAGNOSIS — R252 Cramp and spasm: Secondary | ICD-10-CM | POA: Diagnosis not present

## 2021-02-20 DIAGNOSIS — Z992 Dependence on renal dialysis: Secondary | ICD-10-CM | POA: Diagnosis not present

## 2021-02-20 DIAGNOSIS — L8996 Pressure-induced deep tissue damage of unspecified site: Secondary | ICD-10-CM | POA: Diagnosis not present

## 2021-02-20 DIAGNOSIS — I7300 Raynaud's syndrome without gangrene: Secondary | ICD-10-CM | POA: Diagnosis not present

## 2021-02-20 DIAGNOSIS — N186 End stage renal disease: Secondary | ICD-10-CM | POA: Diagnosis not present

## 2021-02-20 DIAGNOSIS — I953 Hypotension of hemodialysis: Secondary | ICD-10-CM | POA: Diagnosis not present

## 2021-02-20 DIAGNOSIS — N2581 Secondary hyperparathyroidism of renal origin: Secondary | ICD-10-CM | POA: Diagnosis not present

## 2021-02-20 DIAGNOSIS — L89623 Pressure ulcer of left heel, stage 3: Secondary | ICD-10-CM | POA: Diagnosis not present

## 2021-02-20 NOTE — Telephone Encounter (Signed)
I called pt.  No answer, so I sent pt message:   Please increase basal rate to 2.4 units/HR, 2PM-8PM, and 0.05 units/hr other times.

## 2021-02-20 NOTE — Telephone Encounter (Signed)
Patient reports that blood sugars are in the high 300s-400s all day long. Today blood sugar was lowest:  FBS 262, 10AM: 384, 4PM: 387(3hr. PcL), acS: 429.  He says they are usually in the 400s by 1pm  And staying high all evening.  Says he thinks he needs more basal insulin.   Please advise.

## 2021-02-21 DIAGNOSIS — R252 Cramp and spasm: Secondary | ICD-10-CM | POA: Diagnosis not present

## 2021-02-21 DIAGNOSIS — N186 End stage renal disease: Secondary | ICD-10-CM | POA: Diagnosis not present

## 2021-02-21 DIAGNOSIS — Z992 Dependence on renal dialysis: Secondary | ICD-10-CM | POA: Diagnosis not present

## 2021-02-21 DIAGNOSIS — N2581 Secondary hyperparathyroidism of renal origin: Secondary | ICD-10-CM | POA: Diagnosis not present

## 2021-02-21 DIAGNOSIS — I953 Hypotension of hemodialysis: Secondary | ICD-10-CM | POA: Diagnosis not present

## 2021-02-21 NOTE — Telephone Encounter (Signed)
Patient reports that he had made the changes to his PDM after supper last night, without difficulty.  He says FBS this morning was 292, and 3 hours pcB was 297 today.   He was told that we need to wait  2-4 days to see the full affect of the basal/bolus changes, and he said he would call back on Friday if readings are still high.

## 2021-02-22 ENCOUNTER — Other Ambulatory Visit: Payer: Self-pay

## 2021-02-22 DIAGNOSIS — Z992 Dependence on renal dialysis: Secondary | ICD-10-CM | POA: Diagnosis not present

## 2021-02-22 DIAGNOSIS — I953 Hypotension of hemodialysis: Secondary | ICD-10-CM | POA: Diagnosis not present

## 2021-02-22 DIAGNOSIS — N2581 Secondary hyperparathyroidism of renal origin: Secondary | ICD-10-CM | POA: Diagnosis not present

## 2021-02-22 DIAGNOSIS — N186 End stage renal disease: Secondary | ICD-10-CM | POA: Diagnosis not present

## 2021-02-22 DIAGNOSIS — R252 Cramp and spasm: Secondary | ICD-10-CM | POA: Diagnosis not present

## 2021-02-22 MED ORDER — FIASP 100 UNIT/ML ~~LOC~~ SOLN: SUBCUTANEOUS | 0 refills | Status: DC

## 2021-02-23 DIAGNOSIS — Z992 Dependence on renal dialysis: Secondary | ICD-10-CM | POA: Diagnosis not present

## 2021-02-23 DIAGNOSIS — R252 Cramp and spasm: Secondary | ICD-10-CM | POA: Diagnosis not present

## 2021-02-23 DIAGNOSIS — N186 End stage renal disease: Secondary | ICD-10-CM | POA: Diagnosis not present

## 2021-02-23 DIAGNOSIS — N2581 Secondary hyperparathyroidism of renal origin: Secondary | ICD-10-CM | POA: Diagnosis not present

## 2021-02-23 DIAGNOSIS — I953 Hypotension of hemodialysis: Secondary | ICD-10-CM | POA: Diagnosis not present

## 2021-02-25 DIAGNOSIS — N2581 Secondary hyperparathyroidism of renal origin: Secondary | ICD-10-CM | POA: Diagnosis not present

## 2021-02-25 DIAGNOSIS — R252 Cramp and spasm: Secondary | ICD-10-CM | POA: Diagnosis not present

## 2021-02-25 DIAGNOSIS — I953 Hypotension of hemodialysis: Secondary | ICD-10-CM | POA: Diagnosis not present

## 2021-02-25 DIAGNOSIS — Z992 Dependence on renal dialysis: Secondary | ICD-10-CM | POA: Diagnosis not present

## 2021-02-25 DIAGNOSIS — N186 End stage renal disease: Secondary | ICD-10-CM | POA: Diagnosis not present

## 2021-02-26 DIAGNOSIS — N2581 Secondary hyperparathyroidism of renal origin: Secondary | ICD-10-CM | POA: Diagnosis not present

## 2021-02-26 DIAGNOSIS — R252 Cramp and spasm: Secondary | ICD-10-CM | POA: Diagnosis not present

## 2021-02-26 DIAGNOSIS — Z992 Dependence on renal dialysis: Secondary | ICD-10-CM | POA: Diagnosis not present

## 2021-02-26 DIAGNOSIS — E1129 Type 2 diabetes mellitus with other diabetic kidney complication: Secondary | ICD-10-CM | POA: Diagnosis not present

## 2021-02-26 DIAGNOSIS — I953 Hypotension of hemodialysis: Secondary | ICD-10-CM | POA: Diagnosis not present

## 2021-02-26 DIAGNOSIS — N186 End stage renal disease: Secondary | ICD-10-CM | POA: Diagnosis not present

## 2021-02-28 DIAGNOSIS — N2581 Secondary hyperparathyroidism of renal origin: Secondary | ICD-10-CM | POA: Diagnosis not present

## 2021-02-28 DIAGNOSIS — R252 Cramp and spasm: Secondary | ICD-10-CM | POA: Diagnosis not present

## 2021-02-28 DIAGNOSIS — Z992 Dependence on renal dialysis: Secondary | ICD-10-CM | POA: Diagnosis not present

## 2021-02-28 DIAGNOSIS — I953 Hypotension of hemodialysis: Secondary | ICD-10-CM | POA: Diagnosis not present

## 2021-02-28 DIAGNOSIS — N186 End stage renal disease: Secondary | ICD-10-CM | POA: Diagnosis not present

## 2021-03-01 ENCOUNTER — Other Ambulatory Visit: Payer: Self-pay

## 2021-03-01 ENCOUNTER — Ambulatory Visit (INDEPENDENT_AMBULATORY_CARE_PROVIDER_SITE_OTHER): Payer: BC Managed Care – PPO | Admitting: Podiatry

## 2021-03-01 DIAGNOSIS — L97513 Non-pressure chronic ulcer of other part of right foot with necrosis of muscle: Secondary | ICD-10-CM

## 2021-03-01 DIAGNOSIS — Z89411 Acquired absence of right great toe: Secondary | ICD-10-CM

## 2021-03-01 DIAGNOSIS — N186 End stage renal disease: Secondary | ICD-10-CM | POA: Diagnosis not present

## 2021-03-01 DIAGNOSIS — F17200 Nicotine dependence, unspecified, uncomplicated: Secondary | ICD-10-CM | POA: Diagnosis not present

## 2021-03-01 DIAGNOSIS — E1169 Type 2 diabetes mellitus with other specified complication: Secondary | ICD-10-CM

## 2021-03-01 DIAGNOSIS — R252 Cramp and spasm: Secondary | ICD-10-CM | POA: Diagnosis not present

## 2021-03-01 DIAGNOSIS — I953 Hypotension of hemodialysis: Secondary | ICD-10-CM | POA: Diagnosis not present

## 2021-03-01 DIAGNOSIS — I739 Peripheral vascular disease, unspecified: Secondary | ICD-10-CM

## 2021-03-01 DIAGNOSIS — N2581 Secondary hyperparathyroidism of renal origin: Secondary | ICD-10-CM | POA: Diagnosis not present

## 2021-03-01 DIAGNOSIS — Z992 Dependence on renal dialysis: Secondary | ICD-10-CM | POA: Diagnosis not present

## 2021-03-01 NOTE — Progress Notes (Signed)
  Subjective:  Patient ID: Duane Hall, male    DOB: [birth year],  MRN: [id number]  Chief Complaint  Patient presents with  . Wound Check    Pt stated that he is doing well they are concerned about the slight odor coming from the foot    DOS: 01/12/2021 Procedure: Partial first ray amputation right foot  65 y.o. male returns for post-op check.  Has had a decent wound drainage and odor  Review of Systems: Negative except as noted in the HPI. Denies N/V/F/Ch.   Objective:  There were no vitals filed for this visit. There is no height or weight on file to calculate BMI. Constitutional Well developed. Well nourished.  Vascular  left foot is warm and well-perfused, the right foot is less warm distally.  Mild edema on the right foot decreased capillary refill at the toes.  Neurologic Normal speech. Oriented to person, place, and time. Epicritic sensation to light touch grossly absent bilaterally.  Dermatologic  left heel ulceration remains completely healed.  Right foot incisional necrosis appears to be stable deep layers are holding.  Serous drainage.  Measures 10.5 cm x 3.5 cm x 1 cm  Orthopedic:  Minimal tenderness to palpation noted about the surgical site.   Assessment:   1. Ulcer of right foot with necrosis of muscle ([name])   2. History of partial ray amputation of first toe of right foot ([name])   3. Type 2 diabetes mellitus with other specified complication, without long-term current use of insulin ([name])   4. ESRD (end stage renal disease) ([name])   5. PAD (peripheral artery disease) ([name])   6. Current smoker    Plan:  Patient was evaluated and treated and all questions answered.  S/p foot surgery right Ulcer right foot incision -Debridement as below. -Dressed with Santyl, DSD.  Continue Santyl at [facility] refill sent -We will reorder wound care dressings -Sutures removed today  Procedure: Excisional Debridement of Wound Rationale: Removal of non-viable soft tissue  from the wound to promote healing.  Anesthesia: none Pre-Debridement Wound Measurements: 9.5 cm x 3.0 cm x 0.5 cm Post-Debridement Wound Measurements: 10.5 cm x 3.5 cm x 1 cm Type of Debridement: Sharp Excisional Tissue Removed: Non-viable soft tissue Depth of Debridement: subcutaneous tissue. Technique: Sharp excisional debridement to bleeding, viable wound base.  Dressing: Dry, sterile, compression dressing. Disposition: Patient tolerated procedure well.  Return in about 3 weeks (around 03/22/2021) for wound re-check.       Return in about 3 weeks (around 03/22/2021) for wound re-check.

## 2021-03-02 ENCOUNTER — Encounter: Payer: Self-pay | Admitting: Podiatry

## 2021-03-04 DIAGNOSIS — I953 Hypotension of hemodialysis: Secondary | ICD-10-CM | POA: Diagnosis not present

## 2021-03-04 DIAGNOSIS — A419 Sepsis, unspecified organism: Secondary | ICD-10-CM | POA: Diagnosis not present

## 2021-03-04 DIAGNOSIS — R252 Cramp and spasm: Secondary | ICD-10-CM | POA: Diagnosis not present

## 2021-03-04 DIAGNOSIS — R9431 Abnormal electrocardiogram [ECG] [EKG]: Secondary | ICD-10-CM | POA: Diagnosis not present

## 2021-03-04 DIAGNOSIS — I96 Gangrene, not elsewhere classified: Secondary | ICD-10-CM | POA: Diagnosis not present

## 2021-03-04 DIAGNOSIS — N2581 Secondary hyperparathyroidism of renal origin: Secondary | ICD-10-CM | POA: Diagnosis not present

## 2021-03-04 DIAGNOSIS — N186 End stage renal disease: Secondary | ICD-10-CM | POA: Diagnosis not present

## 2021-03-04 DIAGNOSIS — Z992 Dependence on renal dialysis: Secondary | ICD-10-CM | POA: Diagnosis not present

## 2021-03-04 MED ORDER — SANTYL 250 UNIT/GM EX OINT: 1.0000 "application " | TOPICAL_OINTMENT | CUTANEOUS | 2 refills | Status: DC

## 2021-03-05 ENCOUNTER — Telehealth: Payer: Self-pay | Admitting: Podiatry

## 2021-03-05 DIAGNOSIS — Z992 Dependence on renal dialysis: Secondary | ICD-10-CM | POA: Diagnosis not present

## 2021-03-05 DIAGNOSIS — R252 Cramp and spasm: Secondary | ICD-10-CM | POA: Diagnosis not present

## 2021-03-05 DIAGNOSIS — N2581 Secondary hyperparathyroidism of renal origin: Secondary | ICD-10-CM | POA: Diagnosis not present

## 2021-03-05 DIAGNOSIS — I953 Hypotension of hemodialysis: Secondary | ICD-10-CM | POA: Diagnosis not present

## 2021-03-05 DIAGNOSIS — N186 End stage renal disease: Secondary | ICD-10-CM | POA: Diagnosis not present

## 2021-03-05 NOTE — Telephone Encounter (Signed)
[facility] pharmacy called needing clarification on instructions for SANTYL ointment. Please advise.

## 2021-03-05 NOTE — Telephone Encounter (Signed)
Addressed.

## 2021-03-06 DIAGNOSIS — L97513 Non-pressure chronic ulcer of other part of right foot with necrosis of muscle: Secondary | ICD-10-CM | POA: Diagnosis not present

## 2021-03-07 ENCOUNTER — Encounter: Payer: Self-pay | Admitting: Podiatry

## 2021-03-07 ENCOUNTER — Inpatient Hospital Stay (HOSPITAL_COMMUNITY)
Admission: EM | Admit: 2021-03-07 | Discharge: 2021-03-16 | DRG: 853 | Disposition: A | Discharge status: Rehabilitation Facility | Payer: BC Managed Care – PPO | Attending: Internal Medicine | Admitting: Internal Medicine

## 2021-03-07 ENCOUNTER — Other Ambulatory Visit: Payer: Self-pay

## 2021-03-07 ENCOUNTER — Emergency Department (HOSPITAL_COMMUNITY): Payer: BC Managed Care – PPO

## 2021-03-07 DIAGNOSIS — L97519 Non-pressure chronic ulcer of other part of right foot with unspecified severity: Secondary | ICD-10-CM | POA: Diagnosis not present

## 2021-03-07 DIAGNOSIS — E11621 Type 2 diabetes mellitus with foot ulcer: Secondary | ICD-10-CM | POA: Diagnosis not present

## 2021-03-07 DIAGNOSIS — I953 Hypotension of hemodialysis: Secondary | ICD-10-CM | POA: Diagnosis not present

## 2021-03-07 DIAGNOSIS — D696 Thrombocytopenia, unspecified: Secondary | ICD-10-CM | POA: Diagnosis not present

## 2021-03-07 DIAGNOSIS — Z809 Family history of malignant neoplasm, unspecified: Secondary | ICD-10-CM | POA: Diagnosis not present

## 2021-03-07 DIAGNOSIS — B1920 Unspecified viral hepatitis C without hepatic coma: Secondary | ICD-10-CM | POA: Diagnosis present

## 2021-03-07 DIAGNOSIS — E1122 Type 2 diabetes mellitus with diabetic chronic kidney disease: Secondary | ICD-10-CM | POA: Diagnosis not present

## 2021-03-07 DIAGNOSIS — E1165 Type 2 diabetes mellitus with hyperglycemia: Secondary | ICD-10-CM | POA: Diagnosis not present

## 2021-03-07 DIAGNOSIS — Z888 Allergy status to other drugs, medicaments and biological substances status: Secondary | ICD-10-CM | POA: Diagnosis not present

## 2021-03-07 DIAGNOSIS — E1152 Type 2 diabetes mellitus with diabetic peripheral angiopathy with gangrene: Secondary | ICD-10-CM | POA: Diagnosis not present

## 2021-03-07 DIAGNOSIS — F17210 Nicotine dependence, cigarettes, uncomplicated: Secondary | ICD-10-CM | POA: Diagnosis present

## 2021-03-07 DIAGNOSIS — I96 Gangrene, not elsewhere classified: Secondary | ICD-10-CM | POA: Diagnosis not present

## 2021-03-07 DIAGNOSIS — I120 Hypertensive chronic kidney disease with stage 5 chronic kidney disease or end stage renal disease: Secondary | ICD-10-CM | POA: Diagnosis not present

## 2021-03-07 DIAGNOSIS — R6520 Severe sepsis without septic shock: Secondary | ICD-10-CM | POA: Diagnosis not present

## 2021-03-07 DIAGNOSIS — E1151 Type 2 diabetes mellitus with diabetic peripheral angiopathy without gangrene: Secondary | ICD-10-CM | POA: Diagnosis not present

## 2021-03-07 DIAGNOSIS — L97429 Non-pressure chronic ulcer of left heel and midfoot with unspecified severity: Secondary | ICD-10-CM | POA: Diagnosis not present

## 2021-03-07 DIAGNOSIS — N401 Enlarged prostate with lower urinary tract symptoms: Secondary | ICD-10-CM

## 2021-03-07 DIAGNOSIS — M868X7 Other osteomyelitis, ankle and foot: Secondary | ICD-10-CM | POA: Diagnosis not present

## 2021-03-07 DIAGNOSIS — D638 Anemia in other chronic diseases classified elsewhere: Secondary | ICD-10-CM | POA: Diagnosis not present

## 2021-03-07 DIAGNOSIS — Z992 Dependence on renal dialysis: Secondary | ICD-10-CM | POA: Diagnosis not present

## 2021-03-07 DIAGNOSIS — Z79899 Other long term (current) drug therapy: Secondary | ICD-10-CM

## 2021-03-07 DIAGNOSIS — E1169 Type 2 diabetes mellitus with other specified complication: Secondary | ICD-10-CM | POA: Diagnosis present

## 2021-03-07 DIAGNOSIS — Z9641 Presence of insulin pump (external) (internal): Secondary | ICD-10-CM | POA: Diagnosis present

## 2021-03-07 DIAGNOSIS — K5903 Drug induced constipation: Secondary | ICD-10-CM | POA: Diagnosis not present

## 2021-03-07 DIAGNOSIS — N2581 Secondary hyperparathyroidism of renal origin: Secondary | ICD-10-CM | POA: Diagnosis not present

## 2021-03-07 DIAGNOSIS — Z7902 Long term (current) use of antithrombotics/antiplatelets: Secondary | ICD-10-CM | POA: Diagnosis not present

## 2021-03-07 DIAGNOSIS — M869 Osteomyelitis, unspecified: Secondary | ICD-10-CM | POA: Diagnosis not present

## 2021-03-07 DIAGNOSIS — M7989 Other specified soft tissue disorders: Secondary | ICD-10-CM | POA: Diagnosis not present

## 2021-03-07 DIAGNOSIS — Z720 Tobacco use: Secondary | ICD-10-CM | POA: Diagnosis not present

## 2021-03-07 DIAGNOSIS — S92323D Displaced fracture of second metatarsal bone, unspecified foot, subsequent encounter for fracture with routine healing: Secondary | ICD-10-CM | POA: Diagnosis not present

## 2021-03-07 DIAGNOSIS — Z794 Long term (current) use of insulin: Secondary | ICD-10-CM

## 2021-03-07 DIAGNOSIS — J449 Chronic obstructive pulmonary disease, unspecified: Secondary | ICD-10-CM | POA: Diagnosis present

## 2021-03-07 DIAGNOSIS — R3912 Poor urinary stream: Secondary | ICD-10-CM | POA: Diagnosis not present

## 2021-03-07 DIAGNOSIS — Z20822 Contact with and (suspected) exposure to covid-19: Secondary | ICD-10-CM | POA: Diagnosis not present

## 2021-03-07 DIAGNOSIS — E876 Hypokalemia: Secondary | ICD-10-CM | POA: Diagnosis present

## 2021-03-07 DIAGNOSIS — A419 Sepsis, unspecified organism: Secondary | ICD-10-CM | POA: Diagnosis not present

## 2021-03-07 DIAGNOSIS — D631 Anemia in chronic kidney disease: Secondary | ICD-10-CM | POA: Diagnosis present

## 2021-03-07 DIAGNOSIS — R6521 Severe sepsis with septic shock: Secondary | ICD-10-CM | POA: Diagnosis present

## 2021-03-07 DIAGNOSIS — R0902 Hypoxemia: Secondary | ICD-10-CM | POA: Diagnosis not present

## 2021-03-07 DIAGNOSIS — N186 End stage renal disease: Secondary | ICD-10-CM | POA: Diagnosis present

## 2021-03-07 DIAGNOSIS — Z89511 Acquired absence of right leg below knee: Secondary | ICD-10-CM | POA: Diagnosis not present

## 2021-03-07 DIAGNOSIS — G8918 Other acute postprocedural pain: Secondary | ICD-10-CM | POA: Diagnosis not present

## 2021-03-07 DIAGNOSIS — S92331A Displaced fracture of third metatarsal bone, right foot, initial encounter for closed fracture: Secondary | ICD-10-CM | POA: Diagnosis not present

## 2021-03-07 DIAGNOSIS — Z4781 Encounter for orthopedic aftercare following surgical amputation: Secondary | ICD-10-CM | POA: Diagnosis not present

## 2021-03-07 DIAGNOSIS — E119 Type 2 diabetes mellitus without complications: Secondary | ICD-10-CM | POA: Diagnosis not present

## 2021-03-07 DIAGNOSIS — N3289 Other specified disorders of bladder: Secondary | ICD-10-CM | POA: Diagnosis present

## 2021-03-07 DIAGNOSIS — E8889 Other specified metabolic disorders: Secondary | ICD-10-CM | POA: Diagnosis present

## 2021-03-07 DIAGNOSIS — Z880 Allergy status to penicillin: Secondary | ICD-10-CM

## 2021-03-07 DIAGNOSIS — Z833 Family history of diabetes mellitus: Secondary | ICD-10-CM

## 2021-03-07 DIAGNOSIS — I70261 Atherosclerosis of native arteries of extremities with gangrene, right leg: Secondary | ICD-10-CM | POA: Diagnosis not present

## 2021-03-07 DIAGNOSIS — Z91048 Other nonmedicinal substance allergy status: Secondary | ICD-10-CM

## 2021-03-07 DIAGNOSIS — G8929 Other chronic pain: Secondary | ICD-10-CM | POA: Diagnosis present

## 2021-03-07 DIAGNOSIS — R0989 Other specified symptoms and signs involving the circulatory and respiratory systems: Secondary | ICD-10-CM | POA: Diagnosis not present

## 2021-03-07 DIAGNOSIS — S91301A Unspecified open wound, right foot, initial encounter: Secondary | ICD-10-CM | POA: Diagnosis not present

## 2021-03-07 DIAGNOSIS — S92333D Displaced fracture of third metatarsal bone, unspecified foot, subsequent encounter for fracture with routine healing: Secondary | ICD-10-CM | POA: Diagnosis not present

## 2021-03-07 DIAGNOSIS — H409 Unspecified glaucoma: Secondary | ICD-10-CM | POA: Diagnosis not present

## 2021-03-07 DIAGNOSIS — R197 Diarrhea, unspecified: Secondary | ICD-10-CM | POA: Diagnosis not present

## 2021-03-07 DIAGNOSIS — E871 Hypo-osmolality and hyponatremia: Secondary | ICD-10-CM | POA: Diagnosis not present

## 2021-03-07 DIAGNOSIS — R252 Cramp and spasm: Secondary | ICD-10-CM | POA: Diagnosis not present

## 2021-03-07 DIAGNOSIS — D649 Anemia, unspecified: Secondary | ICD-10-CM | POA: Diagnosis not present

## 2021-03-07 DIAGNOSIS — R5381 Other malaise: Secondary | ICD-10-CM | POA: Diagnosis not present

## 2021-03-07 DIAGNOSIS — T8743 Infection of amputation stump, right lower extremity: Secondary | ICD-10-CM | POA: Diagnosis not present

## 2021-03-07 DIAGNOSIS — Z7982 Long term (current) use of aspirin: Secondary | ICD-10-CM | POA: Diagnosis not present

## 2021-03-07 LAB — COMPREHENSIVE METABOLIC PANEL
ALT: 25 U/L (ref 0–44)
AST: 40 U/L (ref 15–41)
Albumin: 2.9 g/dL — ABNORMAL LOW (ref 3.5–5.0)
Alkaline Phosphatase: 113 U/L (ref 38–126)
Anion gap: 10 (ref 5–15)
BUN: 25 mg/dL — ABNORMAL HIGH (ref 8–23)
CO2: 31 mmol/L (ref 22–32)
Calcium: 9 mg/dL (ref 8.9–10.3)
Chloride: 92 mmol/L — ABNORMAL LOW (ref 98–111)
Creatinine, Ser: 5.13 mg/dL — ABNORMAL HIGH (ref 0.61–1.24)
GFR, Estimated: 12 mL/min — ABNORMAL LOW (ref >60)
Glucose, Bld: 85 mg/dL (ref 70–99)
Potassium: 2.7 mmol/L — CL (ref 3.5–5.1)
Sodium: 133 mmol/L — ABNORMAL LOW (ref 135–145)
Total Bilirubin: 0.6 mg/dL (ref 0.3–1.2)
Total Protein: 6.1 g/dL — ABNORMAL LOW (ref 6.5–8.1)

## 2021-03-07 LAB — APTT: aPTT: 31 seconds (ref 24–36)

## 2021-03-07 LAB — PROTIME-INR
INR: 1.4 — ABNORMAL HIGH (ref 0.8–1.2)
Prothrombin Time: 16.3 seconds — ABNORMAL HIGH (ref 11.4–15.2)

## 2021-03-07 LAB — CBG MONITORING, ED: Glucose-Capillary: 84 mg/dL (ref 70–99)

## 2021-03-07 LAB — CBC WITH DIFFERENTIAL/PLATELET
Abs Immature Granulocytes: 0.03 10*3/uL (ref 0.00–0.07)
Basophils Absolute: 0 10*3/uL (ref 0.0–0.1)
Basophils Relative: 0 %
Eosinophils Absolute: 0 10*3/uL (ref 0.0–0.5)
Eosinophils Relative: 0 %
HCT: 27.7 % — ABNORMAL LOW (ref 39.0–52.0)
Hemoglobin: 9.6 g/dL — ABNORMAL LOW (ref 13.0–17.0)
Immature Granulocytes: 0 %
Lymphocytes Relative: 4 %
Lymphs Abs: 0.5 10*3/uL — ABNORMAL LOW (ref 0.7–4.0)
MCH: 31.4 pg (ref 26.0–34.0)
MCHC: 34.7 g/dL (ref 30.0–36.0)
MCV: 90.5 fL (ref 80.0–100.0)
Monocytes Absolute: 1 10*3/uL (ref 0.1–1.0)
Monocytes Relative: 9 %
Neutro Abs: 9.2 10*3/uL — ABNORMAL HIGH (ref 1.7–7.7)
Neutrophils Relative %: 87 %
Platelets: 154 10*3/uL (ref 150–400)
RBC: 3.06 MIL/uL — ABNORMAL LOW (ref 4.22–5.81)
RDW: 12.6 % (ref 11.5–15.5)
WBC: 10.7 10*3/uL — ABNORMAL HIGH (ref 4.0–10.5)
nRBC: 0 % (ref 0.0–0.2)

## 2021-03-07 LAB — LACTIC ACID, PLASMA: Lactic Acid, Venous: 2 mmol/L (ref 0.5–1.9)

## 2021-03-07 IMAGING — DX DG FOOT 2V*R*
2 series · 2 of 2 positions shown · non-contrast
Comparison: [DATE]

CLINICAL DATA: Fevers

EXAM:
RIGHT FOOT - 2 VIEW

[foot]
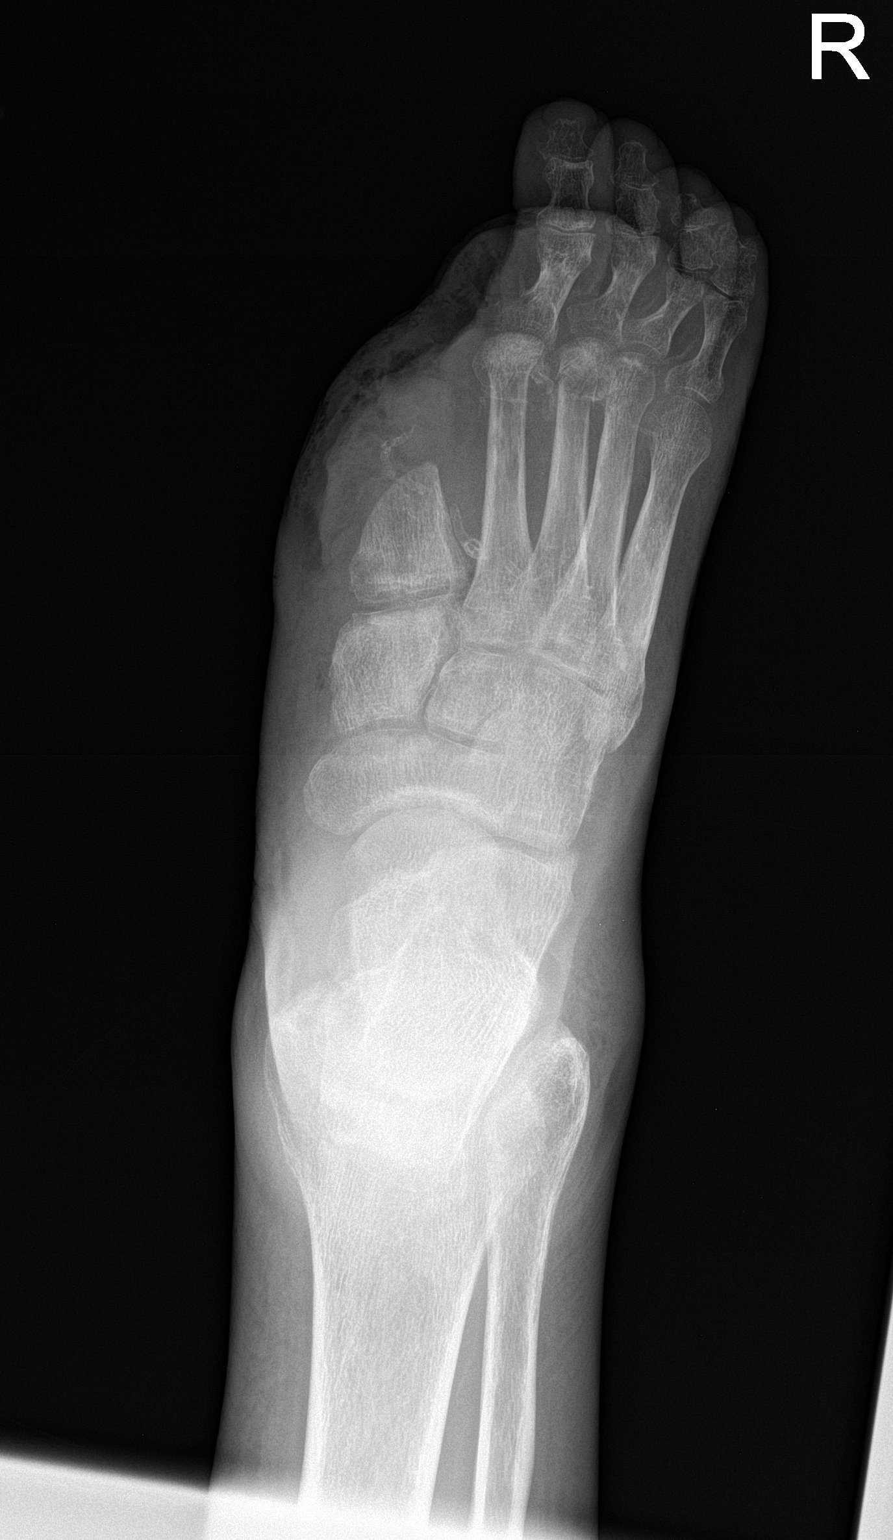

[leg]
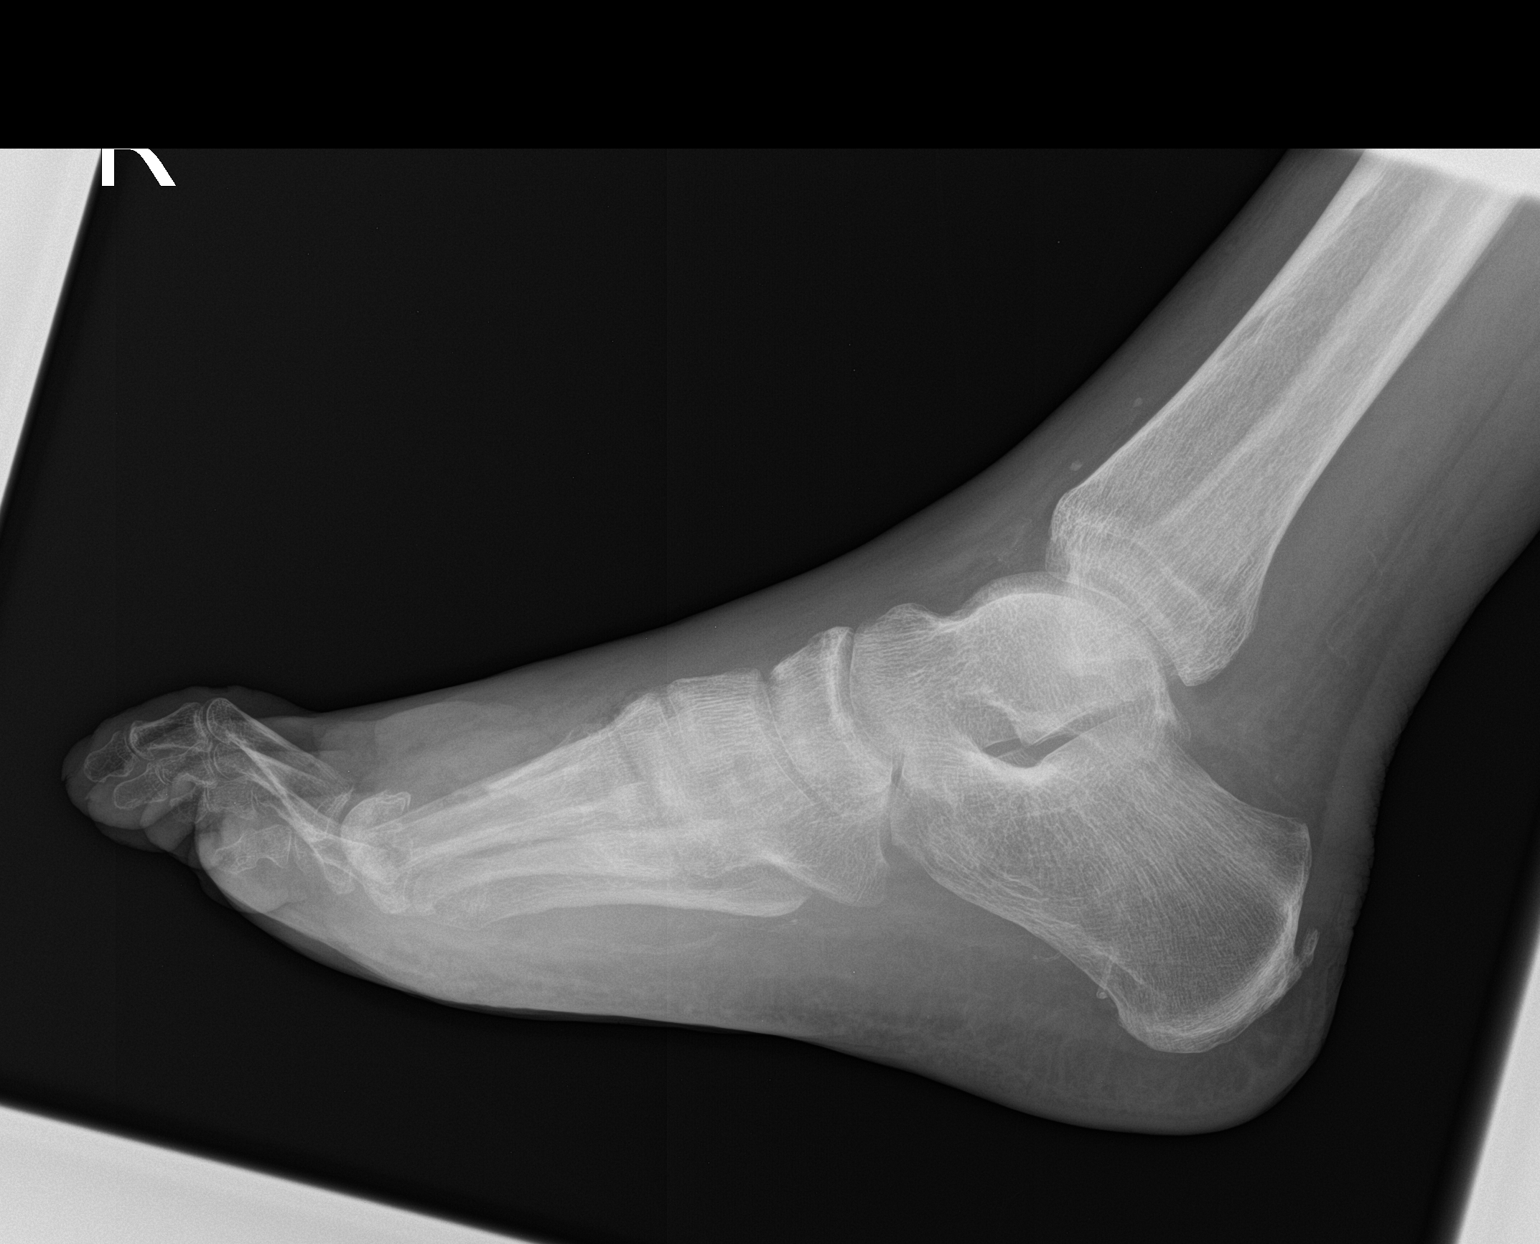

[2 of 2 positions shown; findings below may reference images not displayed]

FINDINGS: Previous transmetatarsal amputation of the first metatarsal is
noted. Fractures involving the second and third metatarsal heads are
seen. Soft tissue irregularity is noted at the amputation site
although no definitive bony erosive changes are seen.
IMPRESSION: Prior surgical changes with soft tissue wound.

No findings to suggest osteomyelitis are seen.

Fractures of the distal aspect of the second and third metatarsals
are seen new from the prior exam.

## 2021-03-07 IMAGING — DX DG CHEST 1V PORT
1 series · 1 of 1 positions shown · non-contrast
Comparison: [DATE]

CLINICAL DATA: Fever.

EXAM:
PORTABLE CHEST 1 VIEW

[chest]
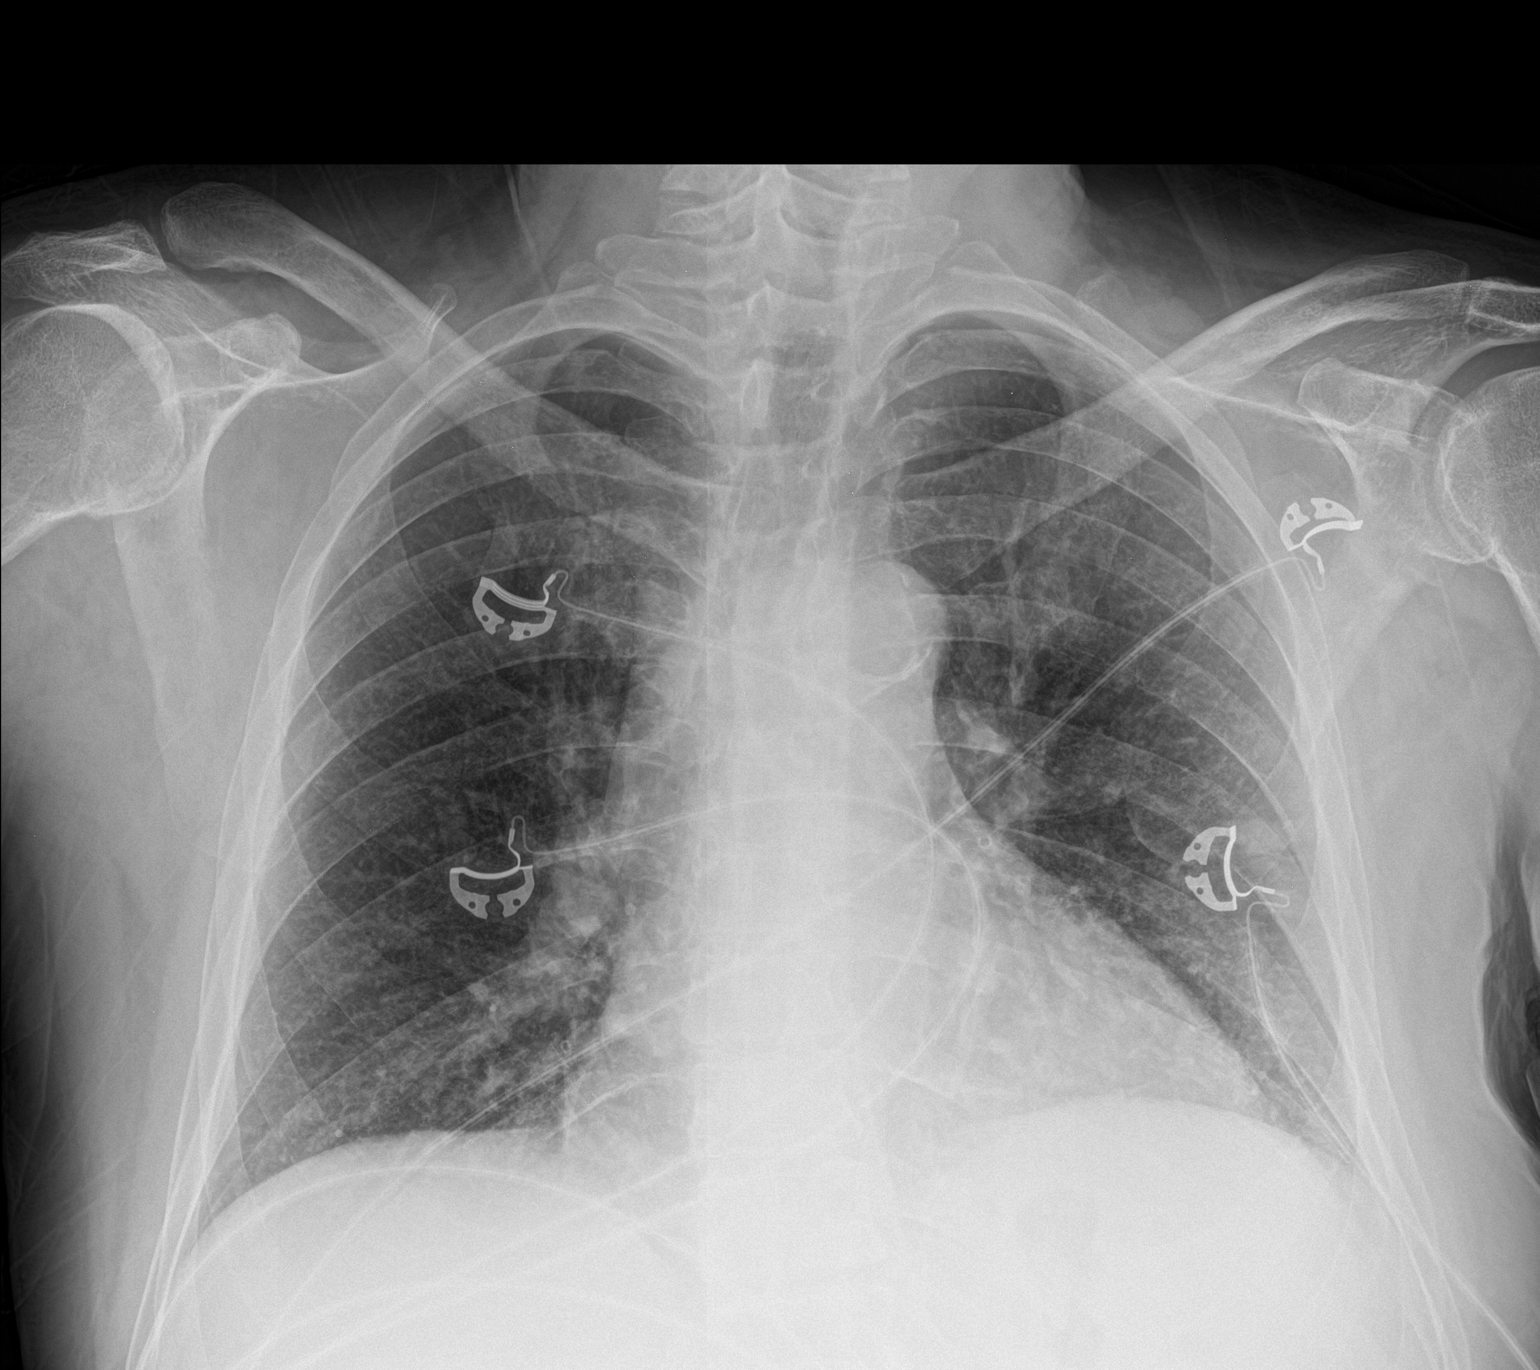

[1 of 1 positions shown; findings below may reference images not displayed]

FINDINGS: Upper normal heart size. Unchanged mediastinal contours. Aortic
atherosclerosis. No focal airspace disease. No pleural effusion or
pneumothorax. No acute osseous abnormalities are seen.
IMPRESSION: No acute abnormality or evidence of pneumonia.

## 2021-03-07 MED ORDER — VANCOMYCIN HCL 1000 MG/200ML IV SOLN
1000.0000 mg | Freq: Once | INTRAVENOUS | Status: AC
  Administered 2021-03-07: 1000 mg via INTRAVENOUS
  Filled 2021-03-07: qty 200

## 2021-03-07 MED ORDER — SODIUM CHLORIDE 0.9 % IV SOLN
2.0000 g | Freq: Once | INTRAVENOUS | Status: AC
  Administered 2021-03-07: 2 g via INTRAVENOUS
  Filled 2021-03-07: qty 20

## 2021-03-07 MED ORDER — LACTATED RINGERS IV BOLUS (SEPSIS)
500.0000 mL | Freq: Once | INTRAVENOUS | Status: AC
  Administered 2021-03-07: 500 mL via INTRAVENOUS

## 2021-03-07 MED ORDER — LACTATED RINGERS IV SOLN
INTRAVENOUS | Status: DC
Start: 2021-03-07 — End: 2021-03-08

## 2021-03-07 MED ORDER — LACTATED RINGERS IV BOLUS
2000.0000 mL | Freq: Once | INTRAVENOUS | Status: AC
  Administered 2021-03-07: 2000 mL via INTRAVENOUS

## 2021-03-07 MED ORDER — VANCOMYCIN VARIABLE DOSE PER UNSTABLE RENAL FUNCTION (PHARMACIST DOSING): Status: DC

## 2021-03-07 NOTE — Sepsis Progress Note (Signed)
Following for sepsis monitoring ?

## 2021-03-07 NOTE — Progress Notes (Incomplete)
Pharmacy Antibiotic Note  Duane Hall is a 65 y.o. male admitted on 03/07/2021 with sepsis.  Pharmacy has been consulted for vancomycin dosing.  Plan: Vancomycin 1000 mg IV x1 (eAUC , goal AUC 400-500, Scr ) Monitor renal function, C&S, clinical status, vanc levels as needed  Height: 5\' 9"  (175.3 cm) Weight: 75 kg (165 lb 5.5 oz) IBW/kg (Calculated) : 70.7  Temp (24hrs), Avg:103 F (39.4 C), Min:103 F (39.4 C), Max:103 F (39.4 C)  No results for input(s): WBC, CREATININE, LATICACIDVEN, VANCOTROUGH, VANCOPEAK, VANCORANDOM, GENTTROUGH, GENTPEAK, GENTRANDOM, TOBRATROUGH, TOBRAPEAK, TOBRARND, AMIKACINPEAK, AMIKACINTROU, AMIKACIN in the last 168 hours.  CrCl cannot be calculated (Patient's most recent lab result is older than the maximum 21 days allowed.).    Allergies  Allergen Reactions  . Amoxicillin-Pot Clavulanate Diarrhea and Other (See Comments)  . Midodrine Other (See Comments)    Other reaction(s): Urinary Sensation  . Tape Other (See Comments)    bandaids cause blistering    Antimicrobials this admission: Vancomycin 3/9 >>   Dose adjustments this admission: N/A  Microbiology results: 3/9 Bcx:  3/9 Ucx:   Thank you for allowing pharmacy to be a part of this patient's care.  Romilda Garret, PharmD PGY1 Acute Care Pharmacy Resident 03/07/2021 8:26 PM  Please check [email or web address] for unit specific pharmacy phone numbers.

## 2021-03-07 NOTE — Progress Notes (Signed)
Pharmacy Antibiotic Note  Duane Hall is a 65 y.o. male admitted on 03/07/2021 with cellulitis.  Pharmacy has been consulted for Vancomycin dosing.   Height: 5\' 9"  (175.3 cm) Weight: 75 kg (165 lb 5.5 oz) IBW/kg (Calculated) : 70.7  Temp (24hrs), Avg:101.1 F (38.4 C), Min:99.1 F (37.3 C), Max:103 F (39.4 C)  Recent Labs  Lab 03/07/21 2017  WBC 10.7*  CREATININE 5.13*  LATICACIDVEN 2.0*    Estimated Creatinine Clearance: 14.7 mL/min (A) (by C-G formula based on SCr of 5.13 mg/dL (H)).    Allergies  Allergen Reactions  . Amoxicillin-Pot Clavulanate Diarrhea and Other (See Comments)  . Midodrine Other (See Comments)    Other reaction(s): Urinary Sensation  . Tape Other (See Comments)    bandaids cause blistering    Antimicrobials this admission: 3/9 Ceftriaxone >>  3/9 Vancomycin >>   Dose adjustments this admission: N/a  Microbiology results: Pending   Plan:  - Vancomycin 1000mg  IV x 1 dose  - Will dose vancomycin by random levels due to current renal function Scr 5.13 not on HD  - Monitor patients renal function and urine output  - De-escalate ABX when appropriate   Thank you for allowing pharmacy to be a part of this patient's care.  Duanne Limerick PharmD. BCPS 03/07/2021 10:08 PM

## 2021-03-07 NOTE — H&P (Signed)
History and Physical   Duane Hall TSV:[id number] DOB: 06/05/1956 DOA: 03/07/2021  Referring MD/NP/PA: Dr. Lavenia Atlas  PCP: Pieter Partridge, PA   Outpatient Specialists: Dr. Lanae Crumbly, podiatry  Patient coming from: Home  Chief Complaint: Right foot ulcer  HPI: Duane Hall is a 65 y.o. male with medical history significant of end-stage renal disease on hemodialysis TTS, diabetes, peripheral vascular disease, history of diabetic foot ulcers with gangrene of the right big toe status post amputation this January who is following up with podiatry.  Patient apparently has had continued nonhealing ulcer at the surgical site.  He continues to have drainage despite wound care.  He appears to have poor circulation on the leg and also has not progressed to healing.  Was seen again today by podiatry and sent to the ER.  He came in and appears to be septic with infection of the ulcer.  There is also evidence of dark peripheral part of the ulcer probably gangrene.  X-ray showed possible pathologic fractures.  Patient being admitted to the hospital with suspicion of gangrene but also sepsis due to the wound infection.  He has been taking his medications and apparently following all recommendations..  ED Course: Temperature 103 blood pressure 70/53 pulse 101 respirate 20 oxygen sat 94% on room air.  White count is 10.7 hemoglobin 9.6 and platelets 154.  Sodium 133 potassium 2.7 chloride 92 was CO2 31 BUN 25 and creatinine 5.13.  White count is 10.7 hemoglobin 9.6 and platelets 154.  X-ray of the foot showed prior surgical changes with soft tissue 1.  There is also fracture of the distal aspect of the second and third metastartals which is chronic.  Chest x-ray showed no acute findings.  Patient being admitted with sepsis due to diabetic foot ulcer infection  Review of Systems: As per HPI otherwise 10 point review of systems negative.    Past Medical History:  Diagnosis Date  . Asthma   . Cancer  ([place])   . Cataract   . CKD (chronic kidney disease), stage III ([place])   . Diabetes mellitus   . Glaucoma   . Vascular insufficiency 05/2020    Past Surgical History:  Procedure Laterality Date  . ABDOMINAL AORTOGRAM W/LOWER EXTREMITY N/A 06/19/2020   Procedure: ABDOMINAL AORTOGRAM W/LOWER EXTREMITY;  Surgeon: Waynetta Sandy, MD;  Location: [facility];  Service: Cardiovascular;  Laterality: N/A;  . ABDOMINAL AORTOGRAM W/LOWER EXTREMITY Left 10/16/2020   Procedure: ABDOMINAL AORTOGRAM W/LOWER EXTREMITY;  Surgeon: Waynetta Sandy, MD;  Location: [facility];  Service: Cardiovascular;  Laterality: Left;  . ABDOMINAL AORTOGRAM W/LOWER EXTREMITY N/A 12/25/2020   Procedure: ABDOMINAL AORTOGRAM W/LOWER EXTREMITY;  Surgeon: Waynetta Sandy, MD;  Location: [facility];  Service: Cardiovascular;  Laterality: N/A;  . AMPUTATION Right 01/12/2021   Procedure: 1ST AMPUTATION RAY;  Surgeon: [name], DPM;  Location: [facility];  Service: Podiatry;  Laterality: Right;  . AV FISTULA PLACEMENT Right 09/14/2019   Procedure: Right Arm Basilic Vein transposition;  Surgeon: Angelia Mould, MD;  Location: [facility];  Service: Vascular;  Laterality: Right;  . BONE BIOPSY Left 07/29/2020   Procedure: BONE BIOPSY;  Surgeon: [name], DPM;  Location: [facility];  Service: Podiatry;  Laterality: Left;  Need bone trephines and/or large bore Giamshidi  . COLONOSCOPY    . PERIPHERAL VASCULAR ATHERECTOMY Left 06/19/2020   Procedure: PERIPHERAL VASCULAR ATHERECTOMY;  Surgeon: Waynetta Sandy, MD;  Location: [facility]  IN[facility]AS[facility];  Service: Cardiovascular;  Laterality: Left;  SFA  . PERIPHERAL VASCULAR ATHERECTOMY  12/25/2020   Procedure: PERIPHERAL VASCULAR ATHERECTOMY;  Surgeon: Waynetta Sandy, MD;  Location: [facility];  Service: Cardiovascular;;  Lt. PT - Laser Lt. SFA - Laser  . PERIPHERAL VASCULAR BALLOON ANGIOPLASTY  12/25/2020    Procedure: PERIPHERAL VASCULAR BALLOON ANGIOPLASTY;  Surgeon: Waynetta Sandy, MD;  Location: [facility];  Service: Cardiovascular;;  Lt. SFA and PT  . UPPER GASTROINTESTINAL ENDOSCOPY  02/2019   Dr Benson Norway       reports that he has been smoking cigarettes. He has been smoking about 0.30 packs per day. He has never used smokeless tobacco. He reports that he does not drink alcohol and does not use drugs.  Allergies  Allergen Reactions  . Amoxicillin-Pot Clavulanate Diarrhea and Other (See Comments)  . Midodrine Other (See Comments)    Other reaction(s): Urinary Sensation  . Tape Other (See Comments)    bandaids cause blistering    Family History  Problem Relation Age of Onset  . Cancer Father   . Diabetes Mother      Prior to Admission medications   Medication Sig Start Date End Date Taking? Authorizing Provider  aspirin EC 81 MG EC tablet Take 1 tablet (81 mg total) by mouth daily. Swallow whole. Patient taking differently: Take 81 mg by mouth every morning. Swallow whole. 06/21/20   Charlynne Cousins, MD  clopidogrel (PLAVIX) 75 MG tablet Take 1 tablet (75 mg total) by mouth daily with breakfast. Patient taking differently: Take 75 mg by mouth every morning. 06/21/20   Charlynne Cousins, MD  collagenase (SANTYL) ointment Apply 1 application topically See admin instructions. Apply to the right foot incision 03/04/21   McDonald, Stephan Minister, DPM  Continuous Blood Gluc Sensor (FREESTYLE LIBRE 14 DAY SENSOR) MISC 1 Device by Other route every 14 (fourteen) days. 02/12/21   Renato Shin, MD  docusate sodium (COLACE) 100 MG capsule Take 100 mg by mouth every morning.    [provider]  ferric citrate (AURYXIA) 1 GM 210 MG(Fe) tablet Take 210 mg by mouth 2 (two) times daily with a meal.    [provider]  gabapentin (NEURONTIN) 100 MG capsule Take 300 mg by mouth every morning. 10/21/20   [provider]  Glucagon (GVOKE HYPOPEN 1-PACK) 1 MG/0.2ML  SOAJ Inject 1 mg into the skin once as needed (low blood sugar).    [provider]  Insulin Aspart, w/Niacinamide, (FIASP) 100 UNIT/ML SOLN For use in pump, total of 30 units per day 02/22/21   Renato Shin, MD  Insulin Disposable Pump (OMNIPOD DASH 5 PACK PODS) MISC Use as instructed: use pod every 3 days. 01/30/21   Renato Shin, MD  Insulin Pen Needle (PEN NEEDLES) 31G X 6 MM MISC 1 Device by Does not apply route in the morning, at noon, in the evening, and at bedtime. 12/12/20   Renato Shin, MD  iron sucrose in sodium chloride 0.9 % 100 mL Iron Sucrose (Venofer) 01/23/21   [provider]  lidocaine-prilocaine (EMLA) cream Apply 1 application topically See admin instructions. Apply topically to port access one hour prior to dialysis on Sunday, Monday, Wednesday, Thursday 05/12/20   [provider]  Methoxy PEG-Epoetin Beta (MIRCERA IJ) Inject into the skin every 30 (thirty) days. 07/03/20   [provider]  multivitamin (RENA-VIT) TABS tablet Take 1 tablet by mouth every morning.    [provider]  mupirocin ointment (BACTROBAN) 2 % Apply 1 application topically 2 (two) times daily. 11/30/20   McDonald, Stephan Minister, DPM  omeprazole (PRILOSEC) 40 MG capsule Take 40 mg by mouth every morning.    [provider]  rosuvastatin (CRESTOR) 10 MG tablet TAKE 1 TABLET BY MOUTH DAILY Patient taking differently: Take 10 mg by mouth every morning. 08/17/20   Waynetta Sandy, MD    Physical Exam: Vitals:   03/07/21 2115 03/07/21 2130 03/07/21 2145 03/07/21 2200  BP: (!) 74/49 (!) 78/53 (!) 85/55 (!) 84/53  Pulse: 81 77 73 70  Resp: 19 13 16 19   Temp: 99.1 F (37.3 C)     TempSrc: Oral     SpO2: 97% 96% 96% 96%  Weight:      Height:          Constitutional: Acutely ill looking, withdrawn, depressed Vitals:   03/07/21 2115 03/07/21 2130 03/07/21 2145 03/07/21 2200  BP: (!) 74/49 (!) 78/53 (!) 85/55 (!) 84/53  Pulse: 81 77 73 70  Resp: 19  13 16 19   Temp: 99.1 F (37.3 C)     TempSrc: Oral     SpO2: 97% 96% 96% 96%  Weight:      Height:       Eyes: PERRL, lids and conjunctivae normal ENMT: Mucous membranes are dry. Posterior pharynx clear of any exudate or lesions.Normal dentition.  Neck: normal, supple, no masses, no thyromegaly Respiratory: clear to auscultation bilaterally, no wheezing, no crackles. Normal respiratory effort. No accessory muscle use.  Cardiovascular: Regular rate and rhythm, no murmurs / rubs / gallops. No extremity edema. 2+ pedal pulses. No carotid bruits.  Abdomen: no tenderness, no masses palpated. No hepatosplenomegaly. Bowel sounds positive.  Musculoskeletal: no clubbing / cyanosis. No joint deformity upper and lower extremities. Good ROM, no contractures. Normal muscle tone.  Right foot ulcer Skin: Large right foot diabetic ulcer with dark margins purulent discharge, redness of the entire foot to the ankle Neurologic: CN 2-12 grossly intact. Sensation intact, DTR normal. Strength 5/5 in all 4.  Psychiatric: Normal judgment and insight. Alert and oriented x 3.  Depressed mood.     Labs on Admission: I have personally reviewed following labs and imaging studies  CBC: Recent Labs  Lab 03/07/21 2017  WBC 10.7*  NEUTROABS 9.2*  HGB 9.6*  HCT 27.7*  MCV 90.5  PLT 505   Basic Metabolic Panel: Recent Labs  Lab 03/07/21 2017  NA 133*  K 2.7*  CL 92*  CO2 31  GLUCOSE 85  BUN 25*  CREATININE 5.13*  CALCIUM 9.0   GFR: Estimated Creatinine Clearance: 14.7 mL/min (A) (by C-G formula based on SCr of 5.13 mg/dL (H)). Liver Function Tests: Recent Labs  Lab 03/07/21 2017  AST 40  ALT 25  ALKPHOS 113  BILITOT 0.6  PROT 6.1*  ALBUMIN 2.9*   No results for input(s): LIPASE, AMYLASE in the last 168 hours. No results for input(s): AMMONIA in the last 168 hours. Coagulation Profile: Recent Labs  Lab 03/07/21 2017  INR 1.4*   Cardiac Enzymes: No results for input(s): CKTOTAL, CKMB,  CKMBINDEX, TROPONINI in the last 168 hours. BNP (last 3 results) No results for input(s): PROBNP in the last 8760 hours. HbA1C: No results for input(s): HGBA1C in the last 72 hours. CBG: Recent Labs  Lab 03/07/21 2003  GLUCAP 84   Lipid Profile: No results for input(s): CHOL, HDL, LDLCALC, TRIG, CHOLHDL, LDLDIRECT in the last 72 hours. Thyroid Function  Tests: No results for input(s): TSH, T4TOTAL, FREET4, T3FREE, THYROIDAB in the last 72 hours. Anemia Panel: No results for input(s): VITAMINB12, FOLATE, FERRITIN, TIBC, IRON, RETICCTPCT in the last 72 hours. Urine analysis:    Component Value Date/Time   COLORURINE AMBER (A) 07/27/2020 1050   APPEARANCEUR CLOUDY (A) 07/27/2020 1050   LABSPEC 1.020 07/27/2020 1050   PHURINE 5.0 07/27/2020 1050   GLUCOSEU 150 (A) 07/27/2020 1050   HGBUR LARGE (A) 07/27/2020 1050   BILIRUBINUR SMALL (A) 07/27/2020 1050   KETONESUR NEGATIVE 07/27/2020 1050   PROTEINUR >=300 (A) 07/27/2020 1050   NITRITE NEGATIVE 07/27/2020 1050   LEUKOCYTESUR NEGATIVE 07/27/2020 1050   Sepsis Labs: @LABRCNTIP (procalcitonin:4,lacticidven:4) )No results found for this or any previous visit (from the past 240 hour(s)).   Radiological Exams on Admission: DG Chest Port 1 View  Result Date: 03/07/2021 CLINICAL DATA:  Fever. EXAM: PORTABLE CHEST 1 VIEW COMPARISON:  01/11/2021 FINDINGS: Upper normal heart size. Unchanged mediastinal contours. Aortic atherosclerosis. No focal airspace disease. No pleural effusion or pneumothorax. No acute osseous abnormalities are seen. IMPRESSION: No acute abnormality or evidence of pneumonia. Electronically Signed   By: Keith Rake M.D.   On: 03/07/2021 20:54   DG Foot 2 Views Right  Result Date: 03/07/2021 CLINICAL DATA:  Fevers EXAM: RIGHT FOOT - 2 VIEW COMPARISON:  01/12/2021 FINDINGS: Previous transmetatarsal amputation of the first metatarsal is noted. Fractures involving the second and third metatarsal heads are seen. Soft  tissue irregularity is noted at the amputation site although no definitive bony erosive changes are seen. IMPRESSION: Prior surgical changes with soft tissue wound. No findings to suggest osteomyelitis are seen. Fractures of the distal aspect of the second and third metatarsals are seen new from the prior exam. Electronically Signed   By: Inez Catalina M.D.   On: 03/07/2021 20:56      Assessment/Plan Principal Problem:   Sepsis ([place]) Active Problems:   Anemia of chronic disease   Hypokalemia   Diabetic foot ulcer (HCC)   Benign prostatic hyperplasia with weak urinary stream   Uncontrolled type 2 diabetes mellitus with hyperglycemia (HCC)   Osteomyelitis of right foot ([place])     #1 sepsis: Secondary to diabetic foot infection.  Possible gangrene.  Patient will be admitted and initiated on broad-spectrum antibiotics.  Due to end-stage renal disease will not do the fluids.  Supportive care.  Obtain cultures.  Consult his podiatrist in the morning.  #2 right foot diabetic ulcer: Infected probably osteomyelitis once again.  We will get MRI of the foot to further evaluate.  Podiatry to be consulted in the morning.  #3 end-stage renal disease: On hemodialysis TTS.  Patient was dialyzed yesterday.  Nephrology to be consulted in the morning.  #4 hypokalemia: We will give oral potassium.  Monitor by nephrology  #5 anemia of chronic disease: Continue to monitor H&H  #6 diabetes: Sliding scale insulin  #7 peripheral vascular disease: Patient has very poor circulation in the right foot.  Already known and is on Plavix.  May require repeat ABI.  DVT prophylaxis: Heparin Code Status: Full code Family Communication: No family at bedside Disposition Plan: To be determined Consults called: None but consult podiatry pneumonia Admission status: Inpatient  Severity of Illness: The appropriate patient status for this patient is INPATIENT. Inpatient status is judged to be reasonable and necessary in  order to provide the required intensity of service to ensure the patient's safety. The patient's presenting symptoms, physical exam findings, and initial radiographic and laboratory  data in the context of their chronic comorbidities is felt to place them at high risk for further clinical deterioration. Furthermore, it is not anticipated that the patient will be medically stable for discharge from the hospital within 2 midnights of admission. The following factors support the patient status of inpatient.   " The patient's presenting symptoms include right foot also. " The worrisome physical exam findings include macerated right foot ulcer. " The initial radiographic and laboratory data are worrisome because of evidence of pathologic fracture. " The chronic co-morbidities include diabetes with end-stage renal disease and peripheral vascular disease.   * I certify that at the point of admission it is my clinical judgment that the patient will require inpatient hospital care spanning beyond 2 midnights from the point of admission due to high intensity of service, high risk for further deterioration and high frequency of surveillance required.Barbette Merino MD [facility] Pager [phone]  If 7PM-7AM, please contact night-coverage [email or web address] Password [facility]  03/07/2021, 10:39 PM

## 2021-03-07 NOTE — ED Triage Notes (Signed)
Patient coming from home with a fever, wife stated he is not acting himself.  Patient is alert and oriented x4 with EMS.  Toe was amputated 5 weeks ago.

## 2021-03-07 NOTE — ED Provider Notes (Signed)
[facility] EMERGENCY DEPARTMENT Provider Note   CSN: [id number] Arrival date & time: 03/07/21  1937     History Chief Complaint  Patient presents with  . Fever    Duane Hall is a 65 y.o. male with a past medical history of end-stage renal disease on hemodialysis Monday Wednesday Friday with last dialysis today, history of peripheral arterial disease, diabetes.  Patient had a right great toe amputation on 01/12/2021.  He reports that he saw his podiatrist about 2 weeks ago and that his wife has been changing the bandages.  Today he had a high fever and his wife "made me come to the ER."  The patient does not want to be here.  He denies any pain in the right foot. HPI     Past Medical History:  Diagnosis Date  . Asthma   . Cancer ([place])   . Cataract   . CKD (chronic kidney disease), stage III ([place])   . Diabetes mellitus   . Glaucoma   . Vascular insufficiency 05/2020    Patient Active Problem List   Diagnosis Date Noted  . Peripheral arterial disease ([place])   . Gangrene of toe of right foot ([place])   . Osteomyelitis of right foot ([place])   . Sepsis ([place]) 01/11/2021  . Cellulitis of right foot 01/11/2021  . Left atrial dilation 08/12/2020  . Lumbar back pain 08/12/2020  . Acute osteomyelitis of left calcaneus (HCC)   . Bilateral pseudophakia 07/28/2020  . Stable proliferative diabetic retinopathy of both eyes associated with type 2 diabetes mellitus ([place]) 07/28/2020  . Diabetic foot ulcer ([place]) 07/27/2020  . End-stage renal disease on hemodialysis ([place]) 07/27/2020  . Gastroparesis diabeticorum ([place]) 07/02/2020  . Vascular insufficiency of extremity 06/18/2020  . Awaiting transplantation of kidney 06/18/2020  . Foot ulcer, left ([place]) 06/18/2020  . Dependence on renal dialysis ([place]) 05/01/2020  . Cramp and spasm 05/01/2020  . Hemodialysis-associated hypotension 05/01/2020  . Aortic atherosclerosis ([place]) 02/28/2020  . Hypoglycemia, unspecified 12/20/2019   . Iron deficiency anemia, unspecified 11/17/2019  . Allergy, unspecified, initial encounter 10/18/2019  . Type 2 diabetes mellitus with diabetic peripheral angiopathy without gangrene ([place]) 10/16/2019  . Other disorders of phosphorus metabolism 10/14/2019  . Secondary hyperparathyroidism of renal origin ([place]) 10/14/2019  . Complication of vascular dialysis catheter 10/09/2019  . Hypercalcemia 10/07/2019  . Hyperlipidemia, unspecified 10/07/2019  . Tobacco use 10/07/2019  . Unspecified glaucoma 10/07/2019  . Encounter for immunization 10/06/2019  . Coagulation defect, unspecified ([place]) 10/05/2019  . End stage renal disease ([place]) 10/05/2019  . Hypokalemia 09/09/2019  . Other emphysema ([place]) 11/23/2018  . Benign prostatic hyperplasia with weak urinary stream 11/18/2018  . Pure hypercholesterolemia 11/18/2018  . Uncontrolled type 2 diabetes mellitus with hyperglycemia ([place]) 11/18/2018  . Diabetic retinopathy ([place]) 08/25/2015  . Claudication ([place]) 04/07/2015  . Anemia of chronic disease 09/12/2014  . Hepatitis C 10/15/2013  . Pain in joint, shoulder region 02/10/2013  . Type II diabetes mellitus ([place]) 02/24/2012  . Hypertension 02/24/2012  . Asthma 02/24/2012    Past Surgical History:  Procedure Laterality Date  . ABDOMINAL AORTOGRAM W/LOWER EXTREMITY N/A 06/19/2020   Procedure: ABDOMINAL AORTOGRAM W/LOWER EXTREMITY;  Surgeon: Waynetta Sandy, MD;  Location: [facility];  Service: Cardiovascular;  Laterality: N/A;  . ABDOMINAL AORTOGRAM W/LOWER EXTREMITY Left 10/16/2020   Procedure: ABDOMINAL AORTOGRAM W/LOWER EXTREMITY;  Surgeon: Waynetta Sandy, MD;  Location: [facility];  Service: Cardiovascular;  Laterality: Left;  .  ABDOMINAL AORTOGRAM W/LOWER EXTREMITY N/A 12/25/2020   Procedure: ABDOMINAL AORTOGRAM W/LOWER EXTREMITY;  Surgeon: Waynetta Sandy, MD;  Location: [facility];  Service: Cardiovascular;  Laterality: N/A;  .  AMPUTATION Right 01/12/2021   Procedure: 1ST AMPUTATION RAY;  Surgeon: [name], DPM;  Location: [facility];  Service: Podiatry;  Laterality: Right;  . AV FISTULA PLACEMENT Right 09/14/2019   Procedure: Right Arm Basilic Vein transposition;  Surgeon: Angelia Mould, MD;  Location: [facility];  Service: Vascular;  Laterality: Right;  . BONE BIOPSY Left 07/29/2020   Procedure: BONE BIOPSY;  Surgeon: [name], DPM;  Location: [facility];  Service: Podiatry;  Laterality: Left;  Need bone trephines and/or large bore Giamshidi  . COLONOSCOPY    . PERIPHERAL VASCULAR ATHERECTOMY Left 06/19/2020   Procedure: PERIPHERAL VASCULAR ATHERECTOMY;  Surgeon: Waynetta Sandy, MD;  Location: [facility];  Service: Cardiovascular;  Laterality: Left;  SFA  . PERIPHERAL VASCULAR ATHERECTOMY  12/25/2020   Procedure: PERIPHERAL VASCULAR ATHERECTOMY;  Surgeon: Waynetta Sandy, MD;  Location: [facility];  Service: Cardiovascular;;  Lt. PT - Laser Lt. SFA - Laser  . PERIPHERAL VASCULAR BALLOON ANGIOPLASTY  12/25/2020   Procedure: PERIPHERAL VASCULAR BALLOON ANGIOPLASTY;  Surgeon: Waynetta Sandy, MD;  Location: [facility];  Service: Cardiovascular;;  Lt. SFA and PT  . UPPER GASTROINTESTINAL ENDOSCOPY  02/2019   Dr Benson Norway         Family History  Problem Relation Age of Onset  . Cancer Father   . Diabetes Mother     Social History   Tobacco Use  . Smoking status: Current Every Day Smoker    Packs/day: 0.30    Types: Cigarettes  . Smokeless tobacco: Never Used  Vaping Use  . Vaping Use: Never used  Substance Use Topics  . Alcohol use: No    Alcohol/week: 0.0 standard drinks  . Drug use: No    Home Medications Prior to Admission medications   Medication Sig Start Date End Date Taking? Authorizing Provider  aspirin EC 81 MG EC tablet Take 1 tablet (81 mg total) by mouth daily. Swallow whole. Patient taking differently: Take 81 mg by mouth every  morning. Swallow whole. 06/21/20  Yes Charlynne Cousins, MD  clopidogrel (PLAVIX) 75 MG tablet Take 1 tablet (75 mg total) by mouth daily with breakfast. Patient taking differently: Take 75 mg by mouth every morning. 06/21/20  Yes Charlynne Cousins, MD  collagenase (SANTYL) ointment Apply 1 application topically See admin instructions. Apply to the right foot incision 03/04/21  Yes McDonald, Stephan Minister, DPM  Continuous Blood Gluc Sensor (FREESTYLE LIBRE 14 DAY SENSOR) MISC 1 Device by Other route every 14 (fourteen) days. 02/12/21  Yes Renato Shin, MD  docusate sodium (COLACE) 100 MG capsule Take 100 mg by mouth every morning.   Yes [provider]  ferric citrate (AURYXIA) 1 GM 210 MG(Fe) tablet Take 210 mg by mouth 2 (two) times daily with a meal.   Yes [provider]  gabapentin (NEURONTIN) 100 MG capsule Take 300 mg by mouth every morning. 10/21/20  Yes [provider]  Glucagon (GVOKE HYPOPEN 1-PACK) 1 MG/0.2ML SOAJ Inject 1 mg into the skin once as needed (low blood sugar).   Yes [provider]  Insulin Aspart, w/Niacinamide, (FIASP) 100 UNIT/ML SOLN For use in pump, total of 30 units per day 02/22/21  Yes Renato Shin, MD  Insulin Disposable Pump (OMNIPOD DASH 5  PACK PODS) MISC Use as instructed: use pod every 3 days. 01/30/21  Yes Renato Shin, MD  Insulin Pen Needle (PEN NEEDLES) 31G X 6 MM MISC 1 Device by Does not apply route in the morning, at noon, in the evening, and at bedtime. 12/12/20  Yes Renato Shin, MD  iron sucrose in sodium chloride 0.9 % 100 mL Iron Sucrose (Venofer) 01/23/21  Yes [provider]  lidocaine-prilocaine (EMLA) cream Apply 1 application topically See admin instructions. Apply topically to port access one hour prior to dialysis on Sunday, Monday, Wednesday, Thursday 05/12/20  Yes [provider]  Methoxy PEG-Epoetin Beta (MIRCERA IJ) Inject into the skin every 30 (thirty) days. 07/03/20  Yes [provider]   multivitamin (RENA-VIT) TABS tablet Take 1 tablet by mouth every morning.   Yes [provider]  mupirocin ointment (BACTROBAN) 2 % Apply 1 application topically 2 (two) times daily. 11/30/20  Yes McDonald, Stephan Minister, DPM  omeprazole (PRILOSEC) 40 MG capsule Take 40 mg by mouth every morning.   Yes [provider]  pantoprazole (PROTONIX) 40 MG tablet Take 40 mg by mouth daily. 02/15/21  Yes [provider]  rosuvastatin (CRESTOR) 10 MG tablet TAKE 1 TABLET BY MOUTH DAILY Patient taking differently: Take 10 mg by mouth every morning. 08/17/20  Yes Waynetta Sandy, MD    Allergies    Amoxicillin-pot clavulanate, Midodrine, and Tape  Review of Systems   Review of Systems Ten systems reviewed and are negative for acute change, except as noted in the HPI.   Physical Exam Updated Vital Signs BP (!) 84/53   Pulse 70   Temp 99.1 F (37.3 C) (Oral)   Resp 19   Ht 5\' 9"  (1.753 m)   Wt 75 kg   SpO2 96%   BMI 24.42 kg/m   Physical Exam Vitals and nursing note reviewed.  Constitutional:      General: He is not in acute distress.    Appearance: He is well-developed and well-nourished. He is not diaphoretic.  HENT:     Head: Normocephalic and atraumatic.  Eyes:     General: No scleral icterus.    Conjunctiva/sclera: Conjunctivae normal.  Cardiovascular:     Rate and Rhythm: Normal rate and regular rhythm.     Heart sounds: Normal heart sounds.  Pulmonary:     Effort: Pulmonary effort is normal. No respiratory distress.     Breath sounds: Normal breath sounds.  Abdominal:     Palpations: Abdomen is soft.     Tenderness: There is no abdominal tenderness.  Musculoskeletal:        General: No edema.     Cervical back: Normal range of motion and neck supple.     Comments: Right foot is extraordinarily malodorous.  Tissue is gangrenous with discharge.  No palpable pulse and unable to Doppler a DP pulse in the right foot however PT pulse is present by  Doppler.  Palpable DP and PT pulse in the left foot.  Skin:    General: Skin is warm and dry.  Neurological:     Mental Status: He is alert.  Psychiatric:        Behavior: Behavior normal.         ED Results / Procedures / Treatments   Labs (all labs ordered are listed, but only abnormal results are displayed) Labs Reviewed  LACTIC ACID, PLASMA - Abnormal; Notable for the following components:      Result Value   Lactic Acid,  Venous 2.0 (*)    All other components within normal limits  COMPREHENSIVE METABOLIC PANEL - Abnormal; Notable for the following components:   Sodium 133 (*)    Potassium 2.7 (*)    Chloride 92 (*)    BUN 25 (*)    Creatinine, Ser 5.13 (*)    Total Protein 6.1 (*)    Albumin 2.9 (*)    GFR, Estimated 12 (*)    All other components within normal limits  CBC WITH DIFFERENTIAL/PLATELET - Abnormal; Notable for the following components:   WBC 10.7 (*)    RBC 3.06 (*)    Hemoglobin 9.6 (*)    HCT 27.7 (*)    Neutro Abs 9.2 (*)    Lymphs Abs 0.5 (*)    All other components within normal limits  PROTIME-INR - Abnormal; Notable for the following components:   Prothrombin Time 16.3 (*)    INR 1.4 (*)    All other components within normal limits  RESP PANEL BY RT-PCR (FLU A&B, COVID) ARPGX2  CULTURE, BLOOD (ROUTINE X 2)  CULTURE, BLOOD (ROUTINE X 2)  URINE CULTURE  APTT  LACTIC ACID, PLASMA  URINALYSIS, ROUTINE W REFLEX MICROSCOPIC  CBG MONITORING, ED    EKG EKG Interpretation  Date/Time:  Wednesday March 07 2021 19:39:53 EST Ventricular Rate:  101 PR Interval:    QRS Duration: 98 QT Interval:  352 QTC Calculation: 457 R Axis:   48 Text Interpretation: Sinus tachycardia Borderline ST depression, lateral leads No significant change since last tracing Confirmed by Deno Etienne [phone]) on 03/07/2021 9:09:21 PM   Radiology DG Chest Port 1 View  Result Date: 03/07/2021 CLINICAL DATA:  Fever. EXAM: PORTABLE CHEST 1 VIEW COMPARISON:  01/11/2021  FINDINGS: Upper normal heart size. Unchanged mediastinal contours. Aortic atherosclerosis. No focal airspace disease. No pleural effusion or pneumothorax. No acute osseous abnormalities are seen. IMPRESSION: No acute abnormality or evidence of pneumonia. Electronically Signed   By: Keith Rake M.D.   On: 03/07/2021 20:54   DG Foot 2 Views Right  Result Date: 03/07/2021 CLINICAL DATA:  Fevers EXAM: RIGHT FOOT - 2 VIEW COMPARISON:  01/12/2021 FINDINGS: Previous transmetatarsal amputation of the first metatarsal is noted. Fractures involving the second and third metatarsal heads are seen. Soft tissue irregularity is noted at the amputation site although no definitive bony erosive changes are seen. IMPRESSION: Prior surgical changes with soft tissue wound. No findings to suggest osteomyelitis are seen. Fractures of the distal aspect of the second and third metatarsals are seen new from the prior exam. Electronically Signed   By: Inez Catalina M.D.   On: 03/07/2021 20:56    Procedures .Critical Care Performed by: Margarita Mail, PA-C Authorized by: Margarita Mail, PA-C   Critical care provider statement:    Critical care time (minutes):  60   Critical care time was exclusive of:  Separately billable procedures and treating other patients   Critical care was necessary to treat or prevent imminent or life-threatening deterioration of the following conditions:  Sepsis   Critical care was time spent personally by me on the following activities:  Discussions with consultants, evaluation of patient's response to treatment, examination of patient, ordering and performing treatments and interventions, ordering and review of laboratory studies, ordering and review of radiographic studies, pulse oximetry, re-evaluation of patient's condition, obtaining history from patient or surrogate and review of old charts   Care discussed with: admitting provider       Medications Ordered in ED Medications  lactated ringers infusion ( Intravenous New Bag/Given 03/07/21 2126)  vancomycin variable dose per unstable renal function (pharmacist dosing) (has no administration in time range)  lactated ringers bolus 500 mL (0 mLs Intravenous Stopped 03/07/21 2126)  vancomycin (VANCOREADY) IVPB 1000 mg/200 mL (0 mg Intravenous Stopped 03/07/21 2230)  cefTRIAXone (ROCEPHIN) 2 g in sodium chloride 0.9 % 100 mL IVPB (0 g Intravenous Stopped 03/07/21 2110)  lactated ringers bolus 2,000 mL (2,000 mLs Intravenous New Bag/Given 03/07/21 2208)    ED Course  I have reviewed the triage vital signs and the nursing notes.  Pertinent labs & imaging results that were available during my care of the patient were reviewed by me and considered in my medical decision making (see chart for details).    MDM Rules/Calculators/A&P                          Patient presents with Fever. He is status post right great toe amputation in January.  Was febrile today.  Removal of the bandage from his foot showed malodorous, gangrenous smelling and appearing foot.  He is a vasculopath and also diabetic.  I ordered a code sepsis on the patient.  Pressures have remained soft.  He is getting 30/kg fluid resuscitation despite his end-stage renal disease status.  He has maintained his respiratory effort.  Patient is significantly hypokalemic after his dialysis session today.  Lactic acid elevated at 2.  He has a white blood cell count of 10.7.  PT/INR is mildly elevated.  Blood cultures obtained, patient states that he occasionally makes urine however has not been able to produce any today.  Respiratory panel pending for Covid and influenza.  I ordered and reviewed a 2 view foot which shows fractures of the distal second and third MTP points which are new.  1 view chest x-ray without abnormality.  Patient is receiving broad-spectrum antibiotics with treatment for infection of the foot.  EKG shows sinus tachycardia at a rate of 101 he will be admitted by  Dr. Jonelle Sidle. Final Clinical Impression(s) / ED Diagnoses Final diagnoses:  Gangrene ([place])  Sepsis, due to unspecified organism, unspecified whether acute organ dysfunction present [facility])    Rx / [place] Orders ED Discharge Orders    None       Margarita Mail, PA-C 03/07/21 2311    Deno Etienne, DO 03/07/21 2312

## 2021-03-08 ENCOUNTER — Inpatient Hospital Stay (HOSPITAL_COMMUNITY): Payer: BC Managed Care – PPO

## 2021-03-08 ENCOUNTER — Telehealth: Payer: Self-pay | Admitting: Podiatry

## 2021-03-08 DIAGNOSIS — E11621 Type 2 diabetes mellitus with foot ulcer: Secondary | ICD-10-CM | POA: Diagnosis not present

## 2021-03-08 DIAGNOSIS — I96 Gangrene, not elsewhere classified: Secondary | ICD-10-CM | POA: Diagnosis not present

## 2021-03-08 DIAGNOSIS — N186 End stage renal disease: Secondary | ICD-10-CM | POA: Diagnosis not present

## 2021-03-08 DIAGNOSIS — A419 Sepsis, unspecified organism: Secondary | ICD-10-CM | POA: Diagnosis not present

## 2021-03-08 DIAGNOSIS — L97429 Non-pressure chronic ulcer of left heel and midfoot with unspecified severity: Secondary | ICD-10-CM

## 2021-03-08 LAB — COMPREHENSIVE METABOLIC PANEL
ALT: 27 U/L (ref 0–44)
ALT: 31 U/L (ref 0–44)
AST: 58 U/L — ABNORMAL HIGH (ref 15–41)
AST: 57 U/L — ABNORMAL HIGH (ref 15–41)
Albumin: 2.9 g/dL — ABNORMAL LOW (ref 3.5–5.0)
Albumin: 2.8 g/dL — ABNORMAL LOW (ref 3.5–5.0)
Alkaline Phosphatase: 96 U/L (ref 38–126)
Alkaline Phosphatase: 117 U/L (ref 38–126)
Anion gap: 13 (ref 5–15)
Anion gap: 16 — ABNORMAL HIGH (ref 5–15)
BUN: 36 mg/dL — ABNORMAL HIGH (ref 8–23)
BUN: 41 mg/dL — ABNORMAL HIGH (ref 8–23)
CO2: 27 mmol/L (ref 22–32)
CO2: 24 mmol/L (ref 22–32)
Calcium: 8.5 mg/dL — ABNORMAL LOW (ref 8.9–10.3)
Calcium: 8.5 mg/dL — ABNORMAL LOW (ref 8.9–10.3)
Chloride: 93 mmol/L — ABNORMAL LOW (ref 98–111)
Chloride: 90 mmol/L — ABNORMAL LOW (ref 98–111)
Creatinine, Ser: 6.35 mg/dL — ABNORMAL HIGH (ref 0.61–1.24)
Creatinine, Ser: 6.79 mg/dL — ABNORMAL HIGH (ref 0.61–1.24)
GFR, Estimated: 9 mL/min — ABNORMAL LOW (ref >60)
GFR, Estimated: 8 mL/min — ABNORMAL LOW (ref >60)
Glucose, Bld: 152 mg/dL — ABNORMAL HIGH (ref 70–99)
Glucose, Bld: 368 mg/dL — ABNORMAL HIGH (ref 70–99)
Potassium: 3 mmol/L — ABNORMAL LOW (ref 3.5–5.1)
Potassium: 3.2 mmol/L — ABNORMAL LOW (ref 3.5–5.1)
Sodium: 133 mmol/L — ABNORMAL LOW (ref 135–145)
Sodium: 130 mmol/L — ABNORMAL LOW (ref 135–145)
Total Bilirubin: 0.7 mg/dL (ref 0.3–1.2)
Total Bilirubin: 0.8 mg/dL (ref 0.3–1.2)
Total Protein: 6 g/dL — ABNORMAL LOW (ref 6.5–8.1)
Total Protein: 6 g/dL — ABNORMAL LOW (ref 6.5–8.1)

## 2021-03-08 LAB — PROTIME-INR
INR: 1.4 — ABNORMAL HIGH (ref 0.8–1.2)
Prothrombin Time: 16.5 seconds — ABNORMAL HIGH (ref 11.4–15.2)

## 2021-03-08 LAB — CBC
HCT: 28.4 % — ABNORMAL LOW (ref 39.0–52.0)
Hemoglobin: 9.4 g/dL — ABNORMAL LOW (ref 13.0–17.0)
MCH: 30.7 pg (ref 26.0–34.0)
MCHC: 33.1 g/dL (ref 30.0–36.0)
MCV: 92.8 fL (ref 80.0–100.0)
Platelets: 171 10*3/uL (ref 150–400)
RBC: 3.06 MIL/uL — ABNORMAL LOW (ref 4.22–5.81)
RDW: 13 % (ref 11.5–15.5)
WBC: 9.8 10*3/uL (ref 4.0–10.5)
nRBC: 0 % (ref 0.0–0.2)

## 2021-03-08 LAB — GLUCOSE, CAPILLARY
Glucose-Capillary: 313 mg/dL — ABNORMAL HIGH (ref 70–99)
Glucose-Capillary: 209 mg/dL — ABNORMAL HIGH (ref 70–99)

## 2021-03-08 LAB — LACTIC ACID, PLASMA
Lactic Acid, Venous: 1.6 mmol/L (ref 0.5–1.9)
Lactic Acid, Venous: 2.3 mmol/L (ref 0.5–1.9)
Lactic Acid, Venous: 1.6 mmol/L (ref 0.5–1.9)

## 2021-03-08 LAB — RESP PANEL BY RT-PCR (FLU A&B, COVID) ARPGX2
Influenza A by PCR: NEGATIVE
Influenza B by PCR: NEGATIVE
SARS Coronavirus 2 by RT PCR: NEGATIVE

## 2021-03-08 LAB — PROCALCITONIN: Procalcitonin: 9.19 ng/mL

## 2021-03-08 LAB — CBC WITH DIFFERENTIAL/PLATELET
Abs Immature Granulocytes: 0.05 10*3/uL (ref 0.00–0.07)
Basophils Absolute: 0 10*3/uL (ref 0.0–0.1)
Basophils Relative: 0 %
Eosinophils Absolute: 0 10*3/uL (ref 0.0–0.5)
Eosinophils Relative: 0 %
HCT: 29.4 % — ABNORMAL LOW (ref 39.0–52.0)
Hemoglobin: 10.1 g/dL — ABNORMAL LOW (ref 13.0–17.0)
Immature Granulocytes: 0 %
Lymphocytes Relative: 7 %
Lymphs Abs: 0.8 10*3/uL (ref 0.7–4.0)
MCH: 31.9 pg (ref 26.0–34.0)
MCHC: 34.4 g/dL (ref 30.0–36.0)
MCV: 92.7 fL (ref 80.0–100.0)
Monocytes Absolute: 0.8 10*3/uL (ref 0.1–1.0)
Monocytes Relative: 7 %
Neutro Abs: 10.1 10*3/uL — ABNORMAL HIGH (ref 1.7–7.7)
Neutrophils Relative %: 86 %
Platelets: 175 10*3/uL (ref 150–400)
RBC: 3.17 MIL/uL — ABNORMAL LOW (ref 4.22–5.81)
RDW: 12.9 % (ref 11.5–15.5)
WBC: 11.8 10*3/uL — ABNORMAL HIGH (ref 4.0–10.5)
nRBC: 0 % (ref 0.0–0.2)

## 2021-03-08 LAB — CBG MONITORING, ED
Glucose-Capillary: 123 mg/dL — ABNORMAL HIGH (ref 70–99)
Glucose-Capillary: 194 mg/dL — ABNORMAL HIGH (ref 70–99)

## 2021-03-08 LAB — MAGNESIUM: Magnesium: 1.7 mg/dL (ref 1.7–2.4)

## 2021-03-08 LAB — CORTISOL: Cortisol, Plasma: 22.9 ug/dL

## 2021-03-08 LAB — MRSA PCR SCREENING: MRSA by PCR: NEGATIVE

## 2021-03-08 LAB — CORTISOL-AM, BLOOD: Cortisol - AM: 24.7 ug/dL — ABNORMAL HIGH (ref 6.7–22.6)

## 2021-03-08 IMAGING — MR MR HEEL *R* W/O CM
4 of 5 series · 18 of 40 positions shown · non-contrast
Comparison: Right foot x-rays from yesterday.

CLINICAL DATA: Diabetic foot ulcers.  Prior amputation.

EXAM:
MRI OF THE RIGHT FOREFOOT WITHOUT CONTRAST; MR OF THE RIGHT HEEL
WITHOUT CONTRAST
TECHNIQUE: Multiplanar, multisequence MR imaging of the right foot was
performed. No intravenous contrast was administered.

[Series 13: T1 · coronal · 3.0mm · 0.31mm/px · 3 of 43 slices shown (1 of 2)]
[im 6/43]
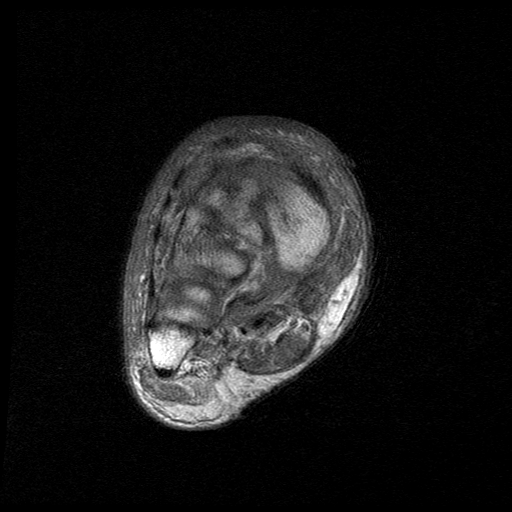
[im 22/43]
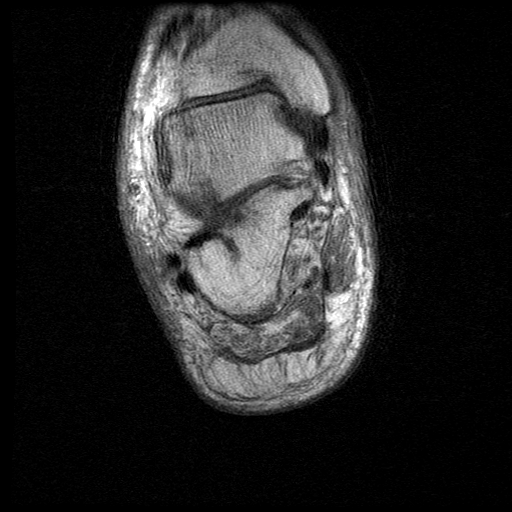
[im 37/43]
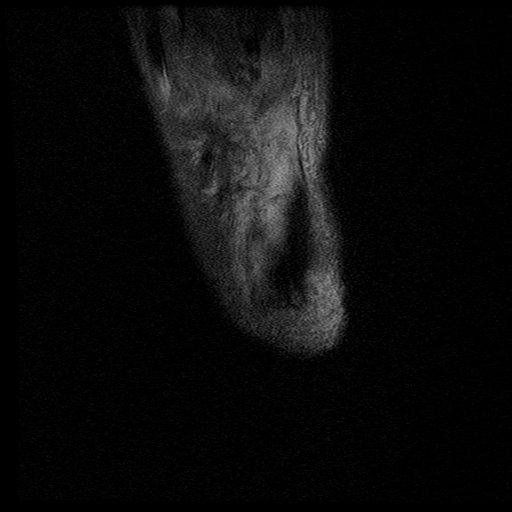

[Series 14: T2 fat-sat · coronal · 3.0mm · 0.31mm/px · 9 of 43 slices shown (1 of 2)]
[im 1/43]
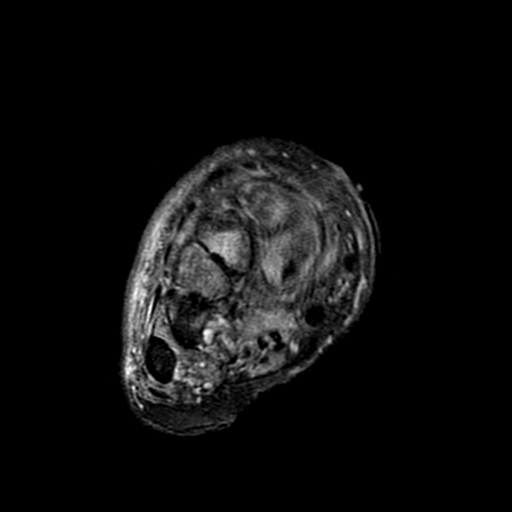
[im 6/43]
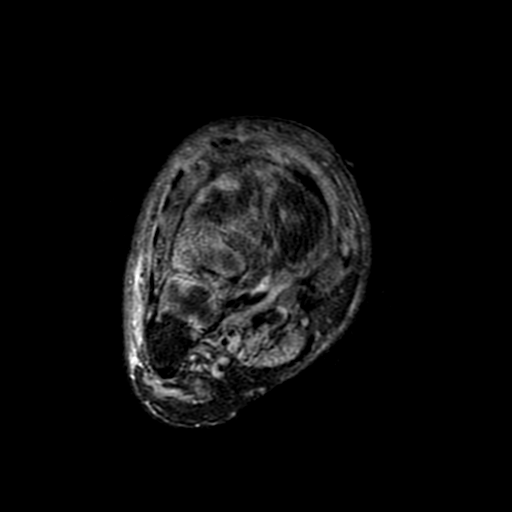
[im 11/43]
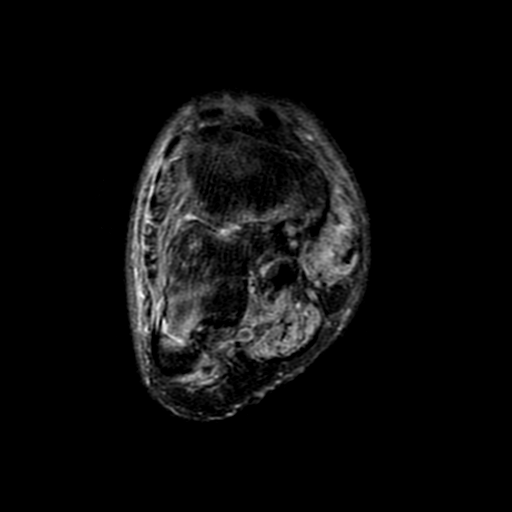
[im 16/43]
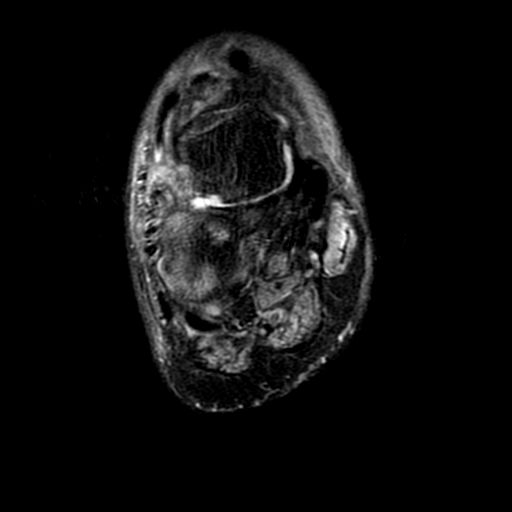
[im 22/43]
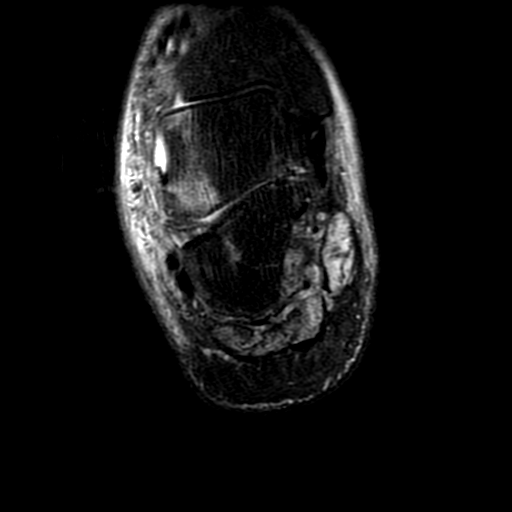
[im 27/43]
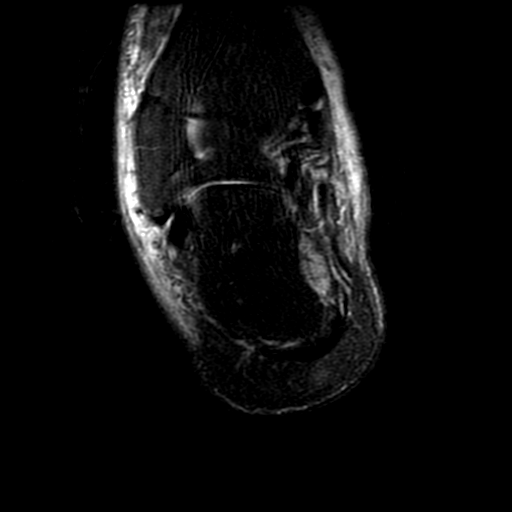
[im 32/43]
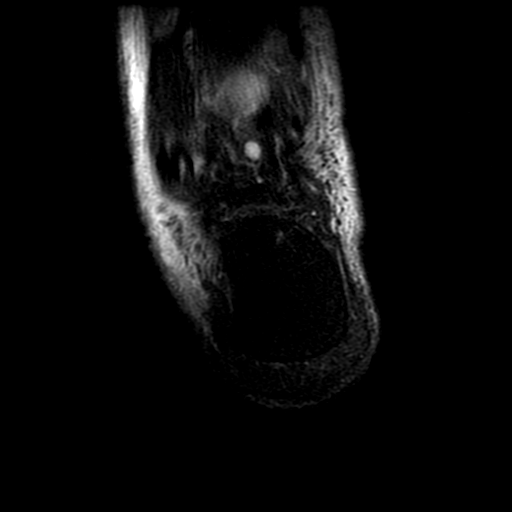
[im 37/43]
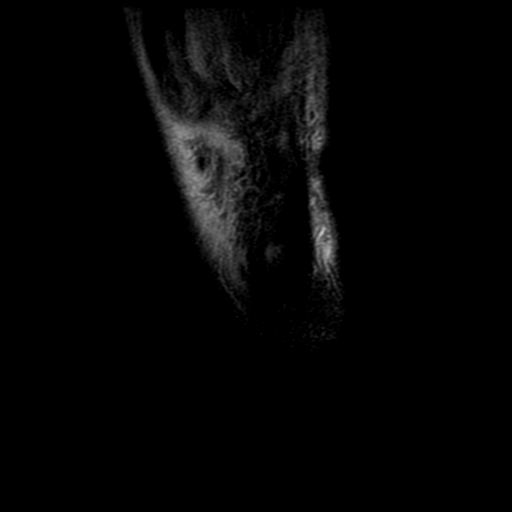
[im 43/43]
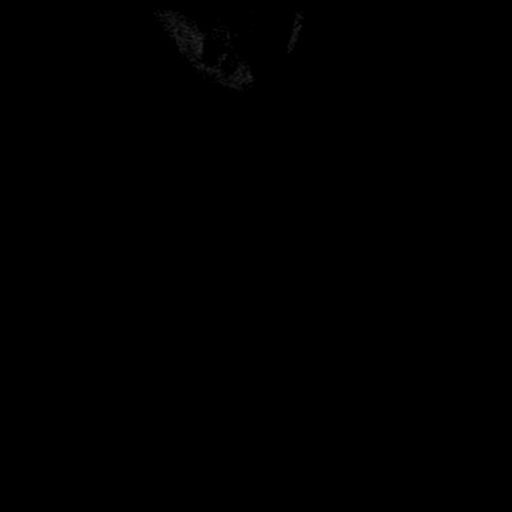

[Series 15: T1 · axial · 3.0mm · 0.31mm/px · z∈[-11,+62]mm · 3 of 33 slices shown (2 of 2)]
[im 6/33]
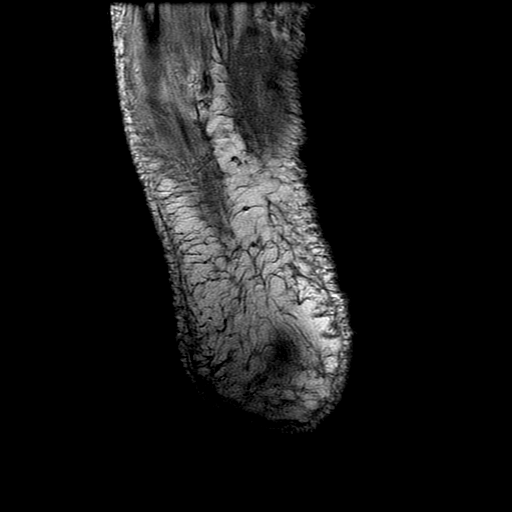
[im 17/33]
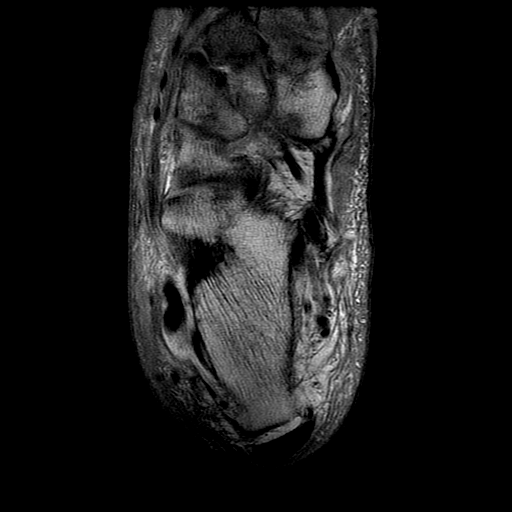
[im 27/33]
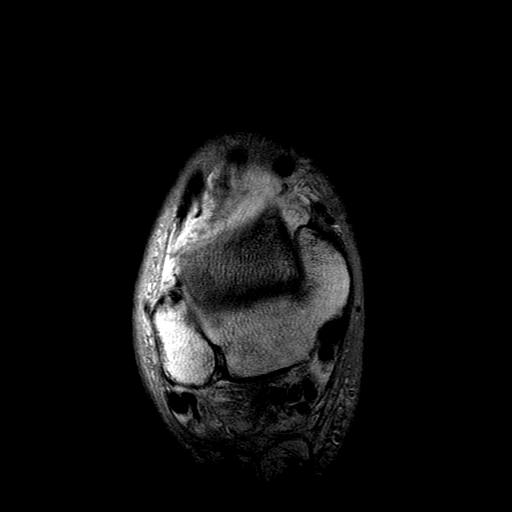

[Series 16: T2 fat-sat · axial · 3.0mm · 0.31mm/px · z∈[-11,+62]mm · 3 of 33 slices shown (2 of 2)]
[im 6/33]
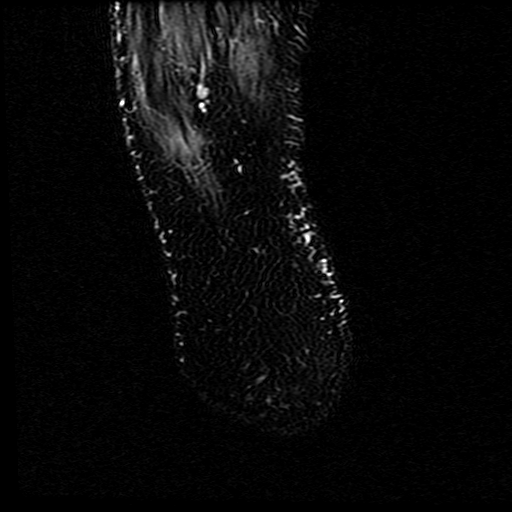
[im 17/33]
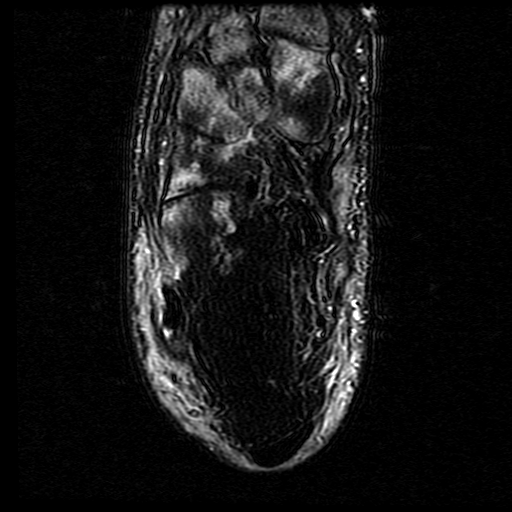
[im 27/33]
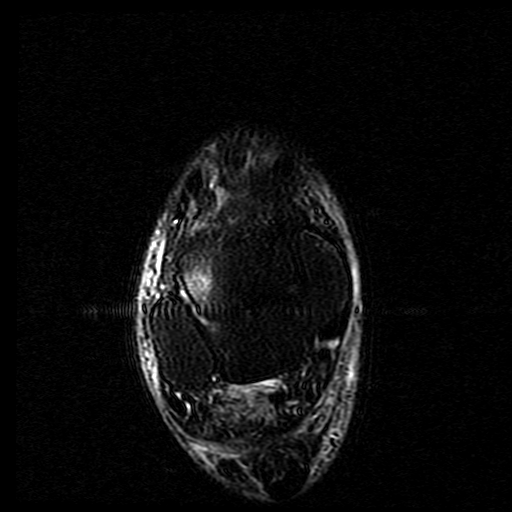

[18 of 40 positions shown; findings below may reference images not displayed]

FINDINGS: Despite efforts by the technologist and patient, motion artifact is
present on today's exam and could not be eliminated. This reduces
exam sensitivity and specificity.

Bones/Joint/Cartilage

Prior first ray amputation. There is subtle erosion of the dorsal
aspect of the residual first metatarsal base with small amount of
adjacent fluid (series 9, image 11). Diffuse patchy, mostly
periarticular marrow edema involving the proximal phalanges,
metatarsals, and tarsal bones.

There are subacute mildly impacted fractures of the second and third
metatarsal necks. No dislocation. No joint effusion.

Ligaments

Toe collateral ligaments are intact. Lisfranc ligament is intact.
Medial and lateral ankle ligaments are intact.

Muscles and Tendons
Flexor, extensor, peroneal, and Achilles tendons are intact.
Increased T2 signal within the intrinsic muscles of the forefoot,
nonspecific, but likely related to diabetic muscle changes.

Soft tissue
Large soft tissue ulceration at the great toe amputation site with
several small foci of subcutaneous emphysema. Mild dorsal forefoot
and bimalleolar soft tissue swelling. No fluid collection or
hematoma. No soft tissue mass.
IMPRESSION: 1. Large soft tissue ulceration at the great toe amputation site
with small amount of subcutaneous emphysema. Subtle erosion of the
dorsal aspect of the residual first metatarsal base with small
amount of adjacent fluid, concerning for early osteomyelitis. No
abscess.
2. Subacute mildly impacted fractures of the second and third
metatarsal necks.
3. Diffuse patchy, mostly periarticular marrow edema involving the
proximal phalanges, metatarsals, and tarsal bones. Suspect
neuropathic arthropathy.

## 2021-03-08 IMAGING — MR MR FOOT*R* W/O CM
4 of 6 series · 18 of 40 positions shown · non-contrast
Comparison: Right foot x-rays from yesterday.

CLINICAL DATA: Diabetic foot ulcers.  Prior amputation.

EXAM:
MRI OF THE RIGHT FOREFOOT WITHOUT CONTRAST; MR OF THE RIGHT HEEL
WITHOUT CONTRAST
TECHNIQUE: Multiplanar, multisequence MR imaging of the right foot was
performed. No intravenous contrast was administered.

[Series 5: T2 fat-sat · oblique · 3.0mm · 0.29mm/px · 8 of 49 slices shown (1 of 2)]
[im 1/49]
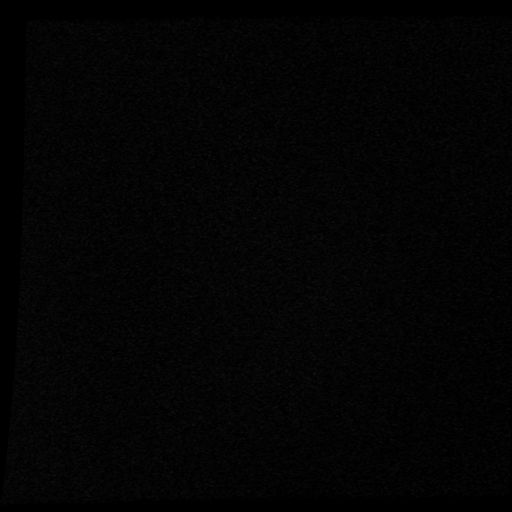
[im 6/49]
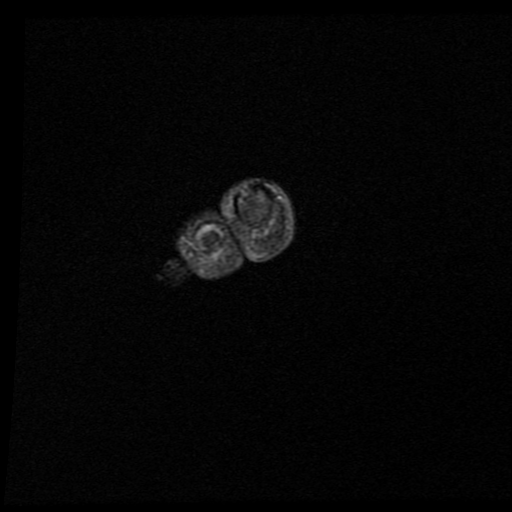
[im 17/49]
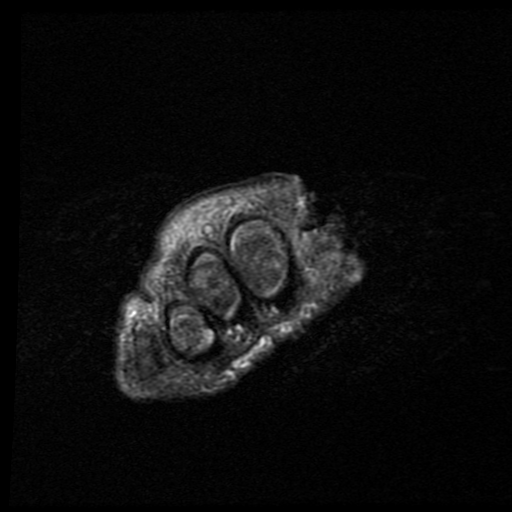
[im 22/49]
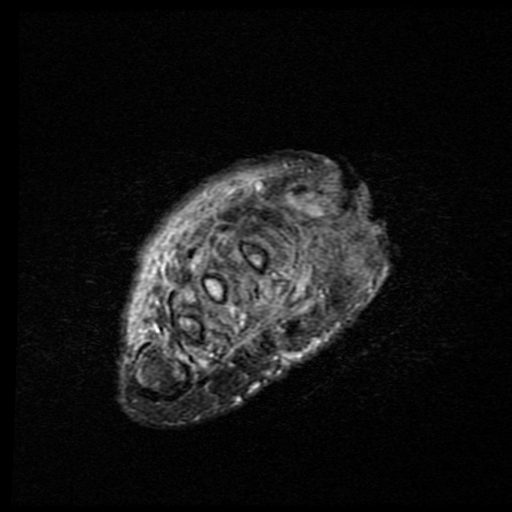
[im 27/49]
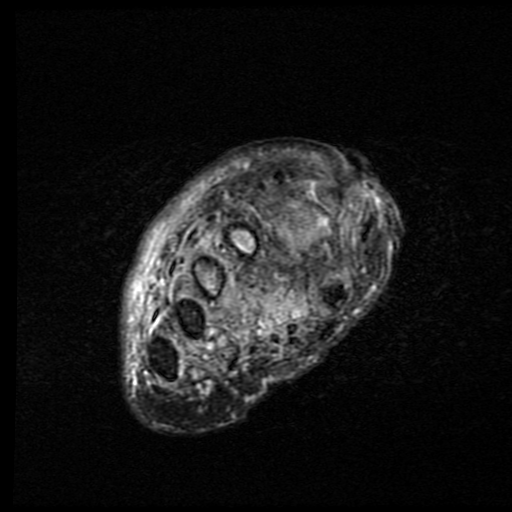
[im 33/49]
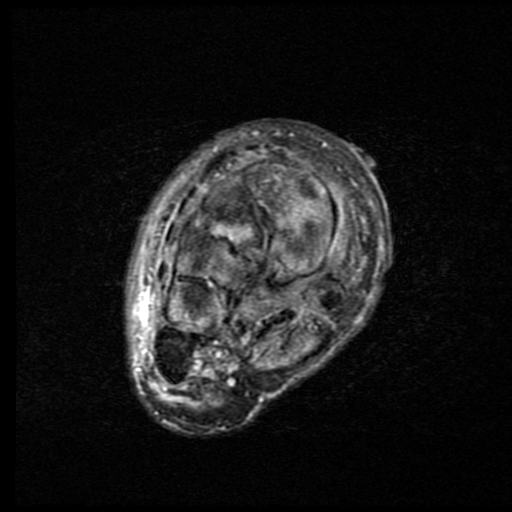
[im 43/49]
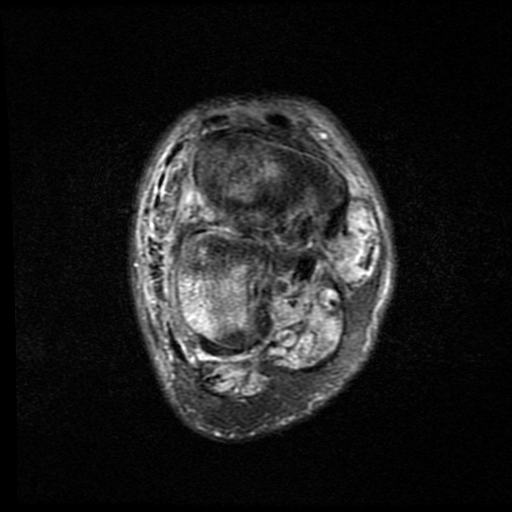
[im 49/49]
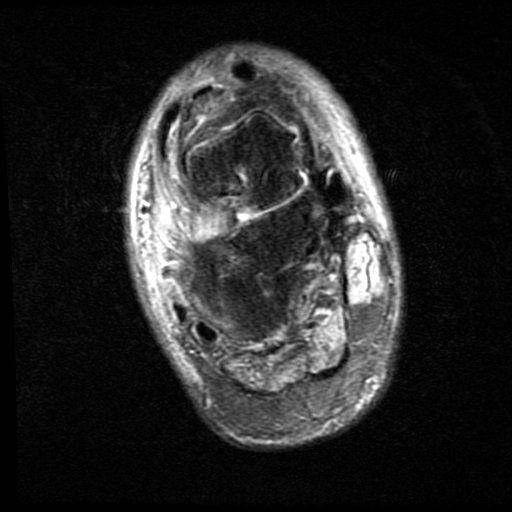

[Series 6: T1 · oblique · 3.0mm · 0.31mm/px · 3 of 17 slices shown (1 of 2)]
[im 1/17]
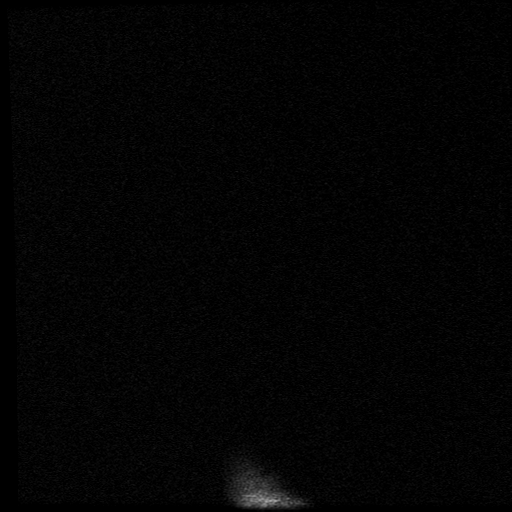
[im 9/17]
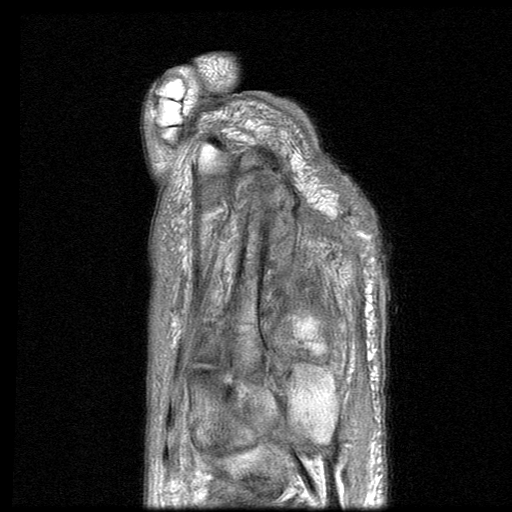
[im 17/17]
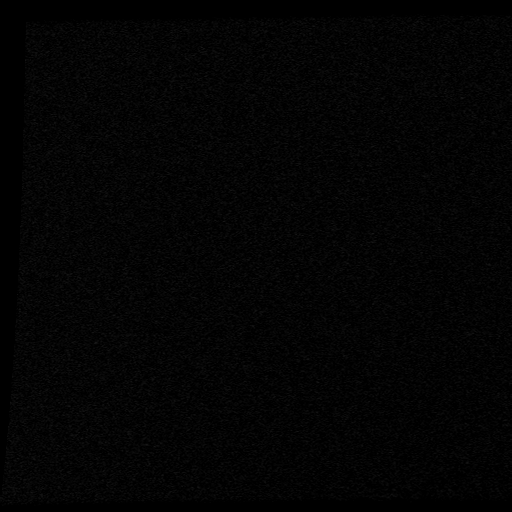

[Series 8: T1 · oblique · 3.0mm · 0.31mm/px · 3 of 34 slices shown (2 of 2)]
[im 7/34]
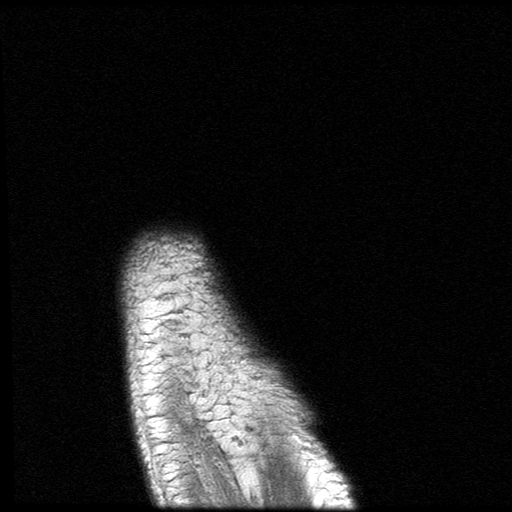
[im 20/34]
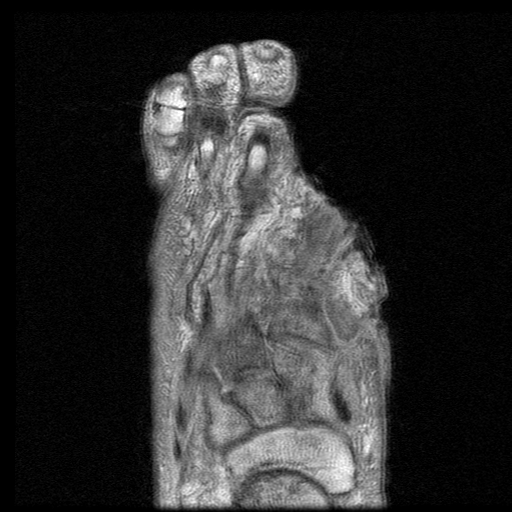
[im 34/34]
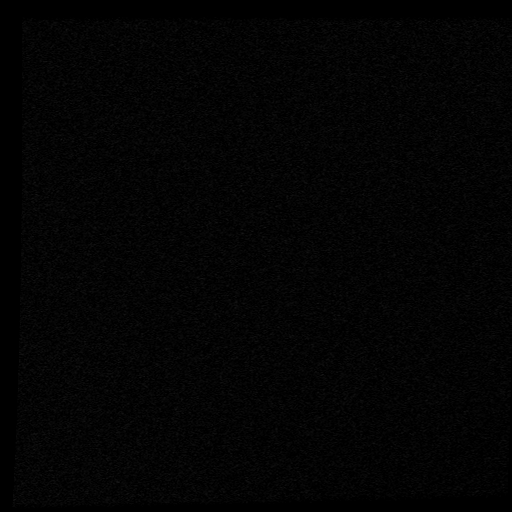

[Series 10: T2 fat-sat · oblique · 3.0mm · 0.31mm/px · 4 of 34 slices shown (2 of 2)]
[im 1/34]
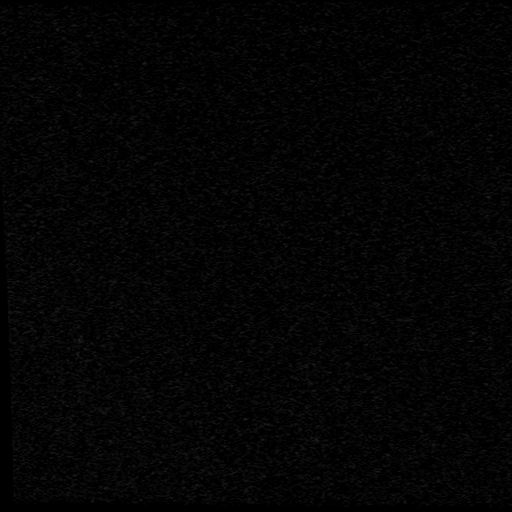
[im 7/34]
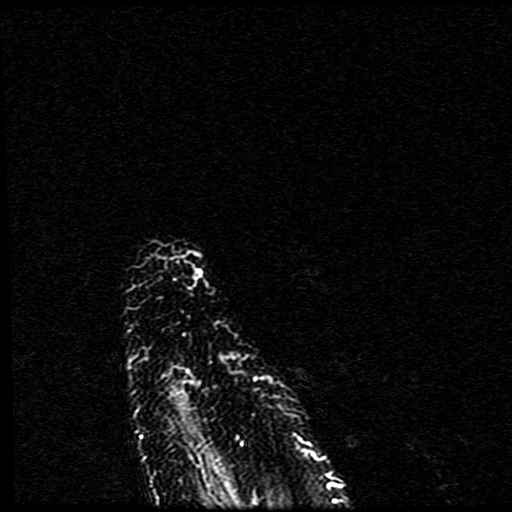
[im 20/34]
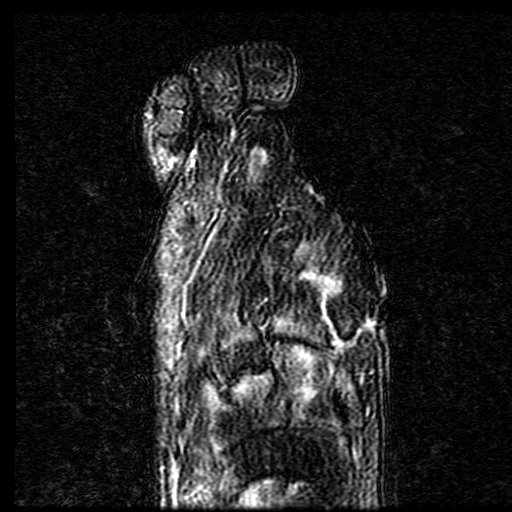
[im 34/34]
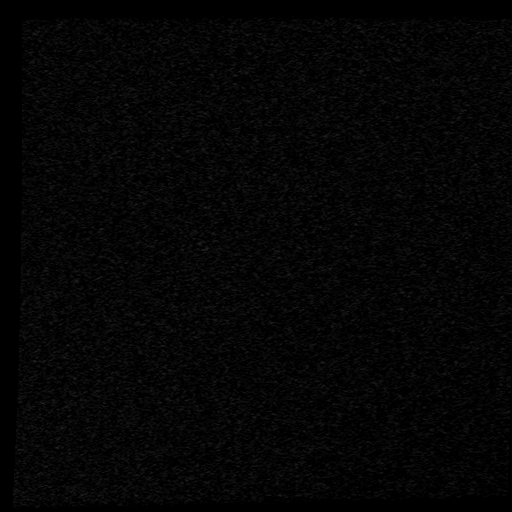

[18 of 40 positions shown; findings below may reference images not displayed]

FINDINGS: Despite efforts by the technologist and patient, motion artifact is
present on today's exam and could not be eliminated. This reduces
exam sensitivity and specificity.

Bones/Joint/Cartilage

Prior first ray amputation. There is subtle erosion of the dorsal
aspect of the residual first metatarsal base with small amount of
adjacent fluid (series 9, image 11). Diffuse patchy, mostly
periarticular marrow edema involving the proximal phalanges,
metatarsals, and tarsal bones.

There are subacute mildly impacted fractures of the second and third
metatarsal necks. No dislocation. No joint effusion.

Ligaments

Toe collateral ligaments are intact. Lisfranc ligament is intact.
Medial and lateral ankle ligaments are intact.

Muscles and Tendons
Flexor, extensor, peroneal, and Achilles tendons are intact.
Increased T2 signal within the intrinsic muscles of the forefoot,
nonspecific, but likely related to diabetic muscle changes.

Soft tissue
Large soft tissue ulceration at the great toe amputation site with
several small foci of subcutaneous emphysema. Mild dorsal forefoot
and bimalleolar soft tissue swelling. No fluid collection or
hematoma. No soft tissue mass.
IMPRESSION: 1. Large soft tissue ulceration at the great toe amputation site
with small amount of subcutaneous emphysema. Subtle erosion of the
dorsal aspect of the residual first metatarsal base with small
amount of adjacent fluid, concerning for early osteomyelitis. No
abscess.
2. Subacute mildly impacted fractures of the second and third
metatarsal necks.
3. Diffuse patchy, mostly periarticular marrow edema involving the
proximal phalanges, metatarsals, and tarsal bones. Suspect
neuropathic arthropathy.

## 2021-03-08 MED ORDER — SODIUM CHLORIDE 0.9 % IV SOLN
500.0000 mg | INTRAVENOUS | Status: DC
  Administered 2021-03-09 – 2021-03-13 (×5): 500 mg via INTRAVENOUS
  Filled 2021-03-08 (×8): qty 0.5

## 2021-03-08 MED ORDER — ACETAMINOPHEN 325 MG PO TABS
650.0000 mg | ORAL_TABLET | Freq: Four times a day (QID) | ORAL | Status: DC | PRN
  Administered 2021-03-08 – 2021-03-09 (×2): 650 mg via ORAL
  Filled 2021-03-08 (×2): qty 2

## 2021-03-08 MED ORDER — ONDANSETRON HCL 4 MG/2ML IJ SOLN
4.0000 mg | Freq: Four times a day (QID) | INTRAMUSCULAR | Status: DC | PRN
  Administered 2021-03-10: 4 mg via INTRAVENOUS
  Filled 2021-03-08 (×2): qty 2

## 2021-03-08 MED ORDER — POTASSIUM CHLORIDE CRYS ER 20 MEQ PO TBCR
20.0000 meq | EXTENDED_RELEASE_TABLET | Freq: Once | ORAL | Status: AC
  Administered 2021-03-08: 20 meq via ORAL

## 2021-03-08 MED ORDER — POTASSIUM CHLORIDE CRYS ER 20 MEQ PO TBCR
40.0000 meq | EXTENDED_RELEASE_TABLET | Freq: Two times a day (BID) | ORAL | Status: DC
  Administered 2021-03-08: 40 meq via ORAL
  Filled 2021-03-08: qty 2

## 2021-03-08 MED ORDER — ACETAMINOPHEN 650 MG RE SUPP: 650.0000 mg | Freq: Four times a day (QID) | RECTAL | Status: DC | PRN

## 2021-03-08 MED ORDER — INSULIN ASPART 100 UNIT/ML ~~LOC~~ SOLN
0.0000 [IU] | Freq: Three times a day (TID) | SUBCUTANEOUS | Status: DC
  Administered 2021-03-08: 4 [IU] via SUBCUTANEOUS
  Administered 2021-03-09 – 2021-03-10 (×2): 3 [IU] via SUBCUTANEOUS
  Administered 2021-03-10: 2 [IU] via SUBCUTANEOUS
  Administered 2021-03-11: 4 [IU] via SUBCUTANEOUS
  Administered 2021-03-11: 2 [IU] via SUBCUTANEOUS
  Administered 2021-03-12: 1 [IU] via SUBCUTANEOUS
  Administered 2021-03-12 – 2021-03-13 (×3): 2 [IU] via SUBCUTANEOUS
  Administered 2021-03-14: 4 [IU] via SUBCUTANEOUS

## 2021-03-08 MED ORDER — SODIUM CHLORIDE 0.9 % IV SOLN
1.0000 g | Freq: Two times a day (BID) | INTRAVENOUS | Status: DC
  Administered 2021-03-08: 1 g via INTRAVENOUS
  Filled 2021-03-08 (×2): qty 1

## 2021-03-08 MED ORDER — VANCOMYCIN HCL 500 MG/100ML IV SOLN
500.0000 mg | Freq: Once | INTRAVENOUS | Status: AC
  Administered 2021-03-08: 500 mg via INTRAVENOUS
  Filled 2021-03-08: qty 100

## 2021-03-08 MED ORDER — FERRIC CITRATE 1 GM 210 MG(FE) PO TABS
210.0000 mg | ORAL_TABLET | Freq: Two times a day (BID) | ORAL | Status: DC
  Administered 2021-03-08 – 2021-03-11 (×6): 210 mg via ORAL
  Filled 2021-03-08 (×9): qty 1

## 2021-03-08 MED ORDER — CHLORHEXIDINE GLUCONATE CLOTH 2 % EX PADS
6.0000 | MEDICATED_PAD | Freq: Every day | CUTANEOUS | Status: DC
  Administered 2021-03-08: 6 via TOPICAL

## 2021-03-08 MED ORDER — ACETAMINOPHEN 325 MG PO TABS
650.0000 mg | ORAL_TABLET | ORAL | Status: DC | PRN
  Administered 2021-03-08: 650 mg via ORAL
  Filled 2021-03-08: qty 2

## 2021-03-08 MED ORDER — LACTATED RINGERS IV BOLUS
500.0000 mL | Freq: Once | INTRAVENOUS | Status: AC
  Administered 2021-03-08: 500 mL via INTRAVENOUS

## 2021-03-08 MED ORDER — HEPARIN SODIUM (PORCINE) 5000 UNIT/ML IJ SOLN
5000.0000 [IU] | Freq: Three times a day (TID) | INTRAMUSCULAR | Status: DC
  Administered 2021-03-08 – 2021-03-16 (×22): 5000 [IU] via SUBCUTANEOUS
  Filled 2021-03-08 (×23): qty 1

## 2021-03-08 MED ORDER — PANTOPRAZOLE SODIUM 40 MG PO TBEC
40.0000 mg | DELAYED_RELEASE_TABLET | Freq: Every day | ORAL | Status: DC
  Administered 2021-03-08 – 2021-03-16 (×8): 40 mg via ORAL
  Filled 2021-03-08 (×8): qty 1

## 2021-03-08 MED ORDER — INSULIN ASPART 100 UNIT/ML ~~LOC~~ SOLN
0.0000 [IU] | Freq: Every day | SUBCUTANEOUS | Status: DC
  Administered 2021-03-08 – 2021-03-13 (×4): 2 [IU] via SUBCUTANEOUS

## 2021-03-08 MED ORDER — ROSUVASTATIN CALCIUM 5 MG PO TABS
10.0000 mg | ORAL_TABLET | Freq: Every morning | ORAL | Status: DC
  Administered 2021-03-08 – 2021-03-16 (×8): 10 mg via ORAL
  Filled 2021-03-08 (×8): qty 2

## 2021-03-08 MED ORDER — SODIUM CHLORIDE 0.9 % IV SOLN: 2.0000 g | INTRAVENOUS | Status: DC

## 2021-03-08 MED ORDER — CHLORHEXIDINE GLUCONATE CLOTH 2 % EX PADS: 6.0000 | MEDICATED_PAD | Freq: Every day | CUTANEOUS | Status: DC

## 2021-03-08 MED ORDER — SODIUM CHLORIDE 0.9 % IV BOLUS
250.0000 mL | Freq: Once | INTRAVENOUS | Status: AC
  Administered 2021-03-08: 250 mL via INTRAVENOUS

## 2021-03-08 MED ORDER — FREESTYLE LIBRE 14 DAY SENSOR MISC: 1.0000 | Status: DC

## 2021-03-08 MED ORDER — ONDANSETRON HCL 4 MG PO TABS: 4.0000 mg | ORAL_TABLET | Freq: Four times a day (QID) | ORAL | Status: DC | PRN

## 2021-03-08 NOTE — ED Notes (Signed)
Pt's family stated that pt does not produce urine and the last time pt urinated was 6 months to 1 year ago.

## 2021-03-08 NOTE — ED Notes (Signed)
Admitting doctor has been paged about the pts low blood pressure and if he can eat

## 2021-03-08 NOTE — H&P (Signed)
NAME:  Duane Hall, MRN:  [id number], DOB:  05/02/1956, LOS: 1 ADMISSION DATE:  03/07/2021, CONSULTATION DATE:  03/08/2021 REFERRING MD:  Barbaraann Faster, MD , CHIEF COMPLAINT:  Confusion   Brief History:  This is a 65 yo M with ESRD, T2DM, PVD s/p right great toe amputation with fever, tachycardia, and hypotension in the context of worsening right foot drainage, redness, and swelling.  History of Present Illness:  Duane Hall is a 65 yo M with ESRD, T2DM, and PVD who had a right great toe amputation on 01/12/2021 due to gangrene. Pt was stable, seeing wound care and his podiatrist until ~1.5 weeks ago when, per wife's report, his amputation site became more red with drainage. Yesterday prior to his HD session, he notes feeling cold, dizzy, and sweaty. After his HD was complete his wife noted that he was confused with an elevated temperature. Pt reports that he has no recollection of events after his HD session until yesterday evening. In the ED he was found to be hypotensive, tachycardic, and febrile to 103. Since being in the ED, pt reports watery stools that started after he received antibiotics.  Past Medical History:  ESRD (HD Su,M,W,Th) IDDM Type 2 PVD Anemia of chronic disease Secondary Hyperparathyroidism  Significant Hospital Events:    Consults:  Vascular Surgery Podiatry Nephrology  Procedures:  None  Significant Diagnostic Tests:  MRI Right Foot 3/10>> 1.Subtle erosion of the dorsal aspect of residual first metatarsal base with small amount of adjacent fluid, concerning for early osteomyelitis 2. Subacute fractures of second and third metatarsal necks. 3. Diffuse, patchy mostly periarticular marrow edema of proximal phalanges, metatarsals, and tarsal bones, suspect neuropathic arthropathy.  Micro Data:  Flu 3/10>> neg Covid 3/10>> neg BCx 3/10>> UCx 3/10>>  Antimicrobials:  Ceftriaxone 3/10>> 1 dose Vancomycin 3/10>> Meropenem 3/10>>   Interim  History / Subjective:    Objective   Blood pressure (!) 97/58, pulse (!) 101, temperature 99.1 F (37.3 C), temperature source Oral, resp. rate 19, height 5\' 9"  (1.753 m), weight 75 kg, SpO2 97 %.        Intake/Output Summary (Last 24 hours) at 03/08/2021 1705 Last data filed at 03/07/2021 2126 Gross per 24 hour  Intake 500 ml  Output -  Net 500 ml   Filed Weights   03/07/21 1943  Weight: 75 kg    Examination: General: Chronically ill appearing, lying in bed comfortably, NAD HENT: MMM, EOMI, pupils equal Lungs: CTAB, no increased work of breathing or accessory muscle use Cardiovascular: RRR, normal Y3K1, 3/6 holosystolic murmur heard best at the right 2nd intercostal space consistent with right arm AV fistula Abdomen: NABS, soft, non-distended, non-tender Extremities: 2+ radial pulses, swelling in right lower leg, right foot great toe amputation site with purulence, necorsis, and surrounding erythema and warmth. erythema and warmth spread into 2nd toe. Skin: warm, well perfused, CR<3s Neuro: A&Ox4, able to converse clearly in full sentences, able to recall recent events until HD session today, 5/5 strength all extremities  Resolved Hospital Problem list     Assessment & Plan:   Severe Sepsis 2/2 right foot gangrene Osteomyelitis -Consider C. Difficile collitis/infectious collitis Pt has improved blood pressure and mentation s/p abx and 2.5 L bolus. Remains tachycardic with elevation in lactic acid. MRI shows likely early osteomyelitis -Vanc and Meropenem -Vascular and Podiatry consulted in ED -Podiatry to see pt today -possible BKA -hold plavix and aspirin -trend WBC, fever curve -f/u lactic acid  ESRD Hypotensive with 4  x weekly HD. -no acute need for HD but will plan for CVC placement and HD tomorrow -plan for intermittent HD, if not tolerated based on BP then CVVHD -f/u RFP, mag, phos  Peripheral Vascular Disease Recent balloon angioplasty and atherectomy 12/27  of right SFA and posterior tibial -Vascular consulted -cont rosuvastatin -hold Plavix and aspirin for possible BKA  Diarrhea New onset diarrhea in ED with concern for possible c.diff. Pt denies any diarrhea prior to admission. -f/u c.diff labs  Diabetes Mellitus Home insulin pump disconnected -CBG q4h -SSI  Hypokalemia Hyponatremia Pt reports chronic hypokalemia requiring increased dietary intake. -K repleted in ED to 3.0 from 2.7 -careful with  Further K and fluid administration given ESRD -further electrolyte correction with HD as needed  Anemia of Chronic Disease -hgb stable 10.1, baseline ~10 -cont ferric citrate   Best practice (evaluated daily)  Diet: Renal Pain/Anxiety/Delirium protocol (if indicated): None VAP protocol (if indicated): NA DVT prophylaxis: SQ Heparin GI prophylaxis: PPI Glucose control: SSI Mobility: Bedrest Disposition: ICU  Goals of Care:   Code Status: Full  Labs   CBC: Recent Labs  Lab 03/07/21 2017 03/08/21 1203  WBC 10.7* 11.8*  NEUTROABS 9.2* 10.1*  HGB 9.6* 10.1*  HCT 27.7* 29.4*  MCV 90.5 92.7  PLT 154 008    Basic Metabolic Panel: Recent Labs  Lab 03/07/21 2017 03/08/21 1203  NA 133* 133*  K 2.7* 3.0*  CL 92* 93*  CO2 31 27  GLUCOSE 85 152*  BUN 25* 36*  CREATININE 5.13* 6.35*  CALCIUM 9.0 8.5*  MG  --  1.7   GFR: Estimated Creatinine Clearance: 11.9 mL/min (A) (by C-G formula based on SCr of 6.35 mg/dL (H)). Recent Labs  Lab 03/07/21 2017 03/08/21 0345 03/08/21 1203  PROCALCITON  --   --  9.19  WBC 10.7*  --  11.8*  LATICACIDVEN 2.0* 1.6 2.3*    Liver Function Tests: Recent Labs  Lab 03/07/21 2017 03/08/21 1203  AST 40 58*  ALT 25 27  ALKPHOS 113 96  BILITOT 0.6 0.7  PROT 6.1* 6.0*  ALBUMIN 2.9* 2.9*   No results for input(s): LIPASE, AMYLASE in the last 168 hours. No results for input(s): AMMONIA in the last 168 hours.  ABG    Component Value Date/Time   TCO2 27 12/25/2020 0645      Coagulation Profile: Recent Labs  Lab 03/07/21 2017 03/08/21 1203  INR 1.4* 1.4*    Cardiac Enzymes: No results for input(s): CKTOTAL, CKMB, CKMBINDEX, TROPONINI in the last 168 hours.  HbA1C: Hemoglobin A1C  Date/Time Value Ref Range Status  02/09/2021 03:22 PM 8.9 (A) 4.0 - 5.6 % Final  01/23/2021 03:53 PM 9.0 (A) 4.0 - 5.6 % Final   Hgb A1c MFr Bld  Date/Time Value Ref Range Status  01/11/2021 09:51 PM 7.5 (H) 4.8 - 5.6 % Final    Comment:    (NOTE) Pre diabetes:          5.7%-6.4%  Diabetes:              >6.4%  Glycemic control for   <7.0% adults with diabetes   07/27/2020 06:39 AM 7.2 (H) 4.8 - 5.6 % Final    Comment:    (NOTE) Pre diabetes:          5.7%-6.4%  Diabetes:              >6.4%  Glycemic control for   <7.0% adults with diabetes     CBG: Recent  Labs  Lab 03/07/21 2003 03/08/21 0058 03/08/21 0551  GLUCAP 84 123* 194*    Review of Systems:   Review of Systems  Constitutional: Positive for chills, fever and malaise/fatigue.  HENT: Negative for congestion.   Respiratory: Negative for cough, hemoptysis, sputum production and shortness of breath.   Cardiovascular: Negative for chest pain and palpitations.  Gastrointestinal: Negative for abdominal pain, constipation, diarrhea, nausea and vomiting.  Genitourinary: Negative for dysuria and hematuria.  Musculoskeletal: Negative for joint pain and myalgias.  Neurological: Positive for dizziness. Negative for headaches.    Past Medical History:  He,  has a past medical history of Asthma, Cancer ([place]), Cataract, CKD (chronic kidney disease), stage III ([place]), Diabetes mellitus, Glaucoma, and Vascular insufficiency (05/2020).   Surgical History:   Past Surgical History:  Procedure Laterality Date  . ABDOMINAL AORTOGRAM W/LOWER EXTREMITY N/A 06/19/2020   Procedure: ABDOMINAL AORTOGRAM W/LOWER EXTREMITY;  Surgeon: Waynetta Sandy, MD;  Location: [facility];  Service:  Cardiovascular;  Laterality: N/A;  . ABDOMINAL AORTOGRAM W/LOWER EXTREMITY Left 10/16/2020   Procedure: ABDOMINAL AORTOGRAM W/LOWER EXTREMITY;  Surgeon: Waynetta Sandy, MD;  Location: [facility];  Service: Cardiovascular;  Laterality: Left;  . ABDOMINAL AORTOGRAM W/LOWER EXTREMITY N/A 12/25/2020   Procedure: ABDOMINAL AORTOGRAM W/LOWER EXTREMITY;  Surgeon: Waynetta Sandy, MD;  Location: [facility];  Service: Cardiovascular;  Laterality: N/A;  . AMPUTATION Right 01/12/2021   Procedure: 1ST AMPUTATION RAY;  Surgeon: [name], DPM;  Location: [facility];  Service: Podiatry;  Laterality: Right;  . AV FISTULA PLACEMENT Right 09/14/2019   Procedure: Right Arm Basilic Vein transposition;  Surgeon: Angelia Mould, MD;  Location: [facility];  Service: Vascular;  Laterality: Right;  . BONE BIOPSY Left 07/29/2020   Procedure: BONE BIOPSY;  Surgeon: [name], DPM;  Location: [facility];  Service: Podiatry;  Laterality: Left;  Need bone trephines and/or large bore Giamshidi  . COLONOSCOPY    . PERIPHERAL VASCULAR ATHERECTOMY Left 06/19/2020   Procedure: PERIPHERAL VASCULAR ATHERECTOMY;  Surgeon: Waynetta Sandy, MD;  Location: [facility];  Service: Cardiovascular;  Laterality: Left;  SFA  . PERIPHERAL VASCULAR ATHERECTOMY  12/25/2020   Procedure: PERIPHERAL VASCULAR ATHERECTOMY;  Surgeon: Waynetta Sandy, MD;  Location: [facility];  Service: Cardiovascular;;  Lt. PT - Laser Lt. SFA - Laser  . PERIPHERAL VASCULAR BALLOON ANGIOPLASTY  12/25/2020   Procedure: PERIPHERAL VASCULAR BALLOON ANGIOPLASTY;  Surgeon: Waynetta Sandy, MD;  Location: [facility];  Service: Cardiovascular;;  Lt. SFA and PT  . UPPER GASTROINTESTINAL ENDOSCOPY  02/2019   Dr Benson [place]       Social History:   reports that he has been smoking cigarettes. He has been smoking about 0.30 packs per day. He has never used smokeless tobacco. He reports that  he does not drink alcohol and does not use drugs.   Family History:  His family history includes Cancer in his father; Diabetes in his mother.   Allergies Allergies  Allergen Reactions  . Amoxicillin-Pot Clavulanate Diarrhea and Other (See Comments)  . Midodrine Other (See Comments)    Other reaction(s): Urinary Sensation  . Tape Other (See Comments)    bandaids cause blistering     Home Medications  Prior to Admission medications   Medication Sig Start Date End Date Taking? Authorizing Provider  aspirin EC 81 MG EC tablet Take 1 tablet (81 mg total) by mouth daily. Swallow whole. Patient taking  differently: Take 81 mg by mouth every morning. Swallow whole. 06/21/20  Yes Charlynne Cousins, MD  clopidogrel (PLAVIX) 75 MG tablet Take 1 tablet (75 mg total) by mouth daily with breakfast. Patient taking differently: Take 75 mg by mouth every morning. 06/21/20  Yes Charlynne Cousins, MD  collagenase (SANTYL) ointment Apply 1 application topically See admin instructions. Apply to the right foot incision 03/04/21  Yes McDonald, Stephan Minister, DPM  Continuous Blood Gluc Sensor (FREESTYLE LIBRE 14 DAY SENSOR) MISC 1 Device by Other route every 14 (fourteen) days. 02/12/21  Yes Renato Shin, MD  docusate sodium (COLACE) 100 MG capsule Take 100 mg by mouth every morning.   Yes [provider]  ferric citrate (AURYXIA) 1 GM 210 MG(Fe) tablet Take 210 mg by mouth 2 (two) times daily with a meal.   Yes [provider]  gabapentin (NEURONTIN) 100 MG capsule Take 300 mg by mouth every morning. 10/21/20  Yes [provider]  Glucagon (GVOKE HYPOPEN 1-PACK) 1 MG/0.2ML SOAJ Inject 1 mg into the skin once as needed (low blood sugar).   Yes [provider]  Insulin Aspart, w/Niacinamide, (FIASP) 100 UNIT/ML SOLN For use in pump, total of 30 units per day 02/22/21  Yes Renato Shin, MD  Insulin Disposable Pump (OMNIPOD DASH 5 PACK PODS) MISC Use as instructed: use pod every 3  days. 01/30/21  Yes Renato Shin, MD  Insulin Pen Needle (PEN NEEDLES) 31G X 6 MM MISC 1 Device by Does not apply route in the morning, at noon, in the evening, and at bedtime. 12/12/20  Yes Renato Shin, MD  iron sucrose in sodium chloride 0.9 % 100 mL Iron Sucrose (Venofer) 01/23/21  Yes [provider]  lidocaine-prilocaine (EMLA) cream Apply 1 application topically See admin instructions. Apply topically to port access one hour prior to dialysis on Sunday, Monday, Wednesday, Thursday 05/12/20  Yes [provider]  Methoxy PEG-Epoetin Beta (MIRCERA IJ) Inject into the skin every 30 (thirty) days. 07/03/20  Yes [provider]  multivitamin (RENA-VIT) TABS tablet Take 1 tablet by mouth every morning.   Yes [provider]  mupirocin ointment (BACTROBAN) 2 % Apply 1 application topically 2 (two) times daily. 11/30/20  Yes McDonald, Stephan Minister, DPM  omeprazole (PRILOSEC) 40 MG capsule Take 40 mg by mouth every morning.   Yes [provider]  rosuvastatin (CRESTOR) 10 MG tablet TAKE 1 TABLET BY MOUTH DAILY Patient taking differently: Take 10 mg by mouth every morning. 08/17/20  Yes Waynetta Sandy, MD     Calvert Cantor, Medical Student 03/08/2021, 5:05 PM

## 2021-03-08 NOTE — Plan of Care (Signed)
Contacted by pulm.  Patient euvolemic with K acceptable, septic.  Defer dialysis for tonight - I agree  Discussed with HD RN to make him aware - cancelling the orders written earlier today.  Team to assess tomorrow to determine HD needs  Claudia Desanctis, MD 6:26 PM 03/08/2021

## 2021-03-08 NOTE — Telephone Encounter (Signed)
Can you let her know I will see him today, I was in surgery late last night and didn't get a chance to call her back

## 2021-03-08 NOTE — ED Notes (Signed)
Pt asking for food call in to doctor to ask

## 2021-03-08 NOTE — Consult Note (Addendum)
[facility] Renal Consultation Note    Indication for Consultation:  Management of ESRD/hemodialysis; anemia, hypertension/volume and secondary hyperparathyroidism PCP: Adrienne Mocha, [facility] Nephrologist: Dr. Joelyn Oms Podiatrist: Dr. Lanae Crumbly  HPI: Duane Hall is a 65 y.o. male with ESRD on Home Hemodialysis 4 times a week. PMH: DMT2, PVD, Hepatitis C, COPD, tobacco abuse, diabetic foot ulcers S/P Partial R great toe amputation in 12/2020 which has failed to heal. He was sent from podiatry office to ED 03/07/21. Temperature 103 on admission. WBC 10.7 HGB 9.6 Na 133 K+ 2.7 SCr 5.13 BUN 25 Co2 31. Lactic acid 2.0. Xray of R foot without evidence of osteomyelitis, pathological fractures 2nd & 3rd R metarsals.CXR unremarkable. He has been admitted for Sepsis D/T diabetic foot infection.  Seen in ED briefly. He says last HD yesterday. EDW is 76 kg. He is C/O abdominal pain, flatus and insisted on going to bathroom for BM. R foot is dressed, unable to visualize wound. No acute dialysis needs today. K+ 2.7 on admission, was repleted per primary. Repeat K+ 3.0 today.   Past Medical History:  Diagnosis Date  . Asthma   . Cancer ([place])   . Cataract   . CKD (chronic kidney disease), stage III ([place])   . Diabetes mellitus   . Glaucoma   . Vascular insufficiency 05/2020   Past Surgical History:  Procedure Laterality Date  . ABDOMINAL AORTOGRAM W/LOWER EXTREMITY N/A 06/19/2020   Procedure: ABDOMINAL AORTOGRAM W/LOWER EXTREMITY;  Surgeon: Waynetta Sandy, MD;  Location: [place] [facility];  Service: Cardiovascular;  Laterality: N/A;  . ABDOMINAL AORTOGRAM W/LOWER EXTREMITY Left 10/16/2020   Procedure: ABDOMINAL AORTOGRAM W/LOWER EXTREMITY;  Surgeon: Waynetta Sandy, MD;  Location: [place] [facility];  Service: Cardiovascular;  Laterality: Left;  . ABDOMINAL AORTOGRAM W/LOWER EXTREMITY N/A 12/25/2020   Procedure: ABDOMINAL AORTOGRAM W/LOWER EXTREMITY;  Surgeon: Waynetta Sandy, MD;  Location: [place] [facility];  Service: Cardiovascular;  Laterality: N/A;  . AMPUTATION Right 01/12/2021   Procedure: 1ST AMPUTATION RAY;  Surgeon: [name], DPM;  Location: [place];  Service: Podiatry;  Laterality: Right;  . AV FISTULA PLACEMENT Right 09/14/2019   Procedure: Right Arm Basilic Vein transposition;  Surgeon: Angelia Mould, MD;  Location: [place];  Service: Vascular;  Laterality: Right;  . BONE BIOPSY Left 07/29/2020   Procedure: BONE BIOPSY;  Surgeon: [name], DPM;  Location: [place];  Service: Podiatry;  Laterality: Left;  Need bone trephines and/or large bore Giamshidi  . COLONOSCOPY    . PERIPHERAL VASCULAR ATHERECTOMY Left 06/19/2020   Procedure: PERIPHERAL VASCULAR ATHERECTOMY;  Surgeon: Waynetta Sandy, MD;  Location: [place] [facility];  Service: Cardiovascular;  Laterality: Left;  SFA  . PERIPHERAL VASCULAR ATHERECTOMY  12/25/2020   Procedure: PERIPHERAL VASCULAR ATHERECTOMY;  Surgeon: Waynetta Sandy, MD;  Location: [place] [facility];  Service: Cardiovascular;;  Lt. PT - Laser Lt. SFA - Laser  . PERIPHERAL VASCULAR BALLOON ANGIOPLASTY  12/25/2020   Procedure: PERIPHERAL VASCULAR BALLOON ANGIOPLASTY;  Surgeon: Waynetta Sandy, MD;  Location: [place] [facility];  Service: Cardiovascular;;  Lt. SFA and PT  . UPPER GASTROINTESTINAL ENDOSCOPY  02/2019   Dr Benson Norway     Family History  Problem Relation Age of Onset  . Cancer Father   . Diabetes Mother    Social History:  reports that he has been smoking cigarettes. He has been smoking about 0.30 packs per day. He has never used smokeless  tobacco. He reports that he does not drink alcohol and does not use drugs. Allergies  Allergen Reactions  . Amoxicillin-Pot Clavulanate Diarrhea and Other (See Comments)  . Midodrine Other (See Comments)    Other reaction(s): Urinary Sensation  . Tape Other (See Comments)    bandaids cause blistering   Prior to  Admission medications   Medication Sig Start Date End Date Taking? Authorizing Provider  aspirin EC 81 MG EC tablet Take 1 tablet (81 mg total) by mouth daily. Swallow whole. Patient taking differently: Take 81 mg by mouth every morning. Swallow whole. 06/21/20  Yes Charlynne Cousins, MD  clopidogrel (PLAVIX) 75 MG tablet Take 1 tablet (75 mg total) by mouth daily with breakfast. Patient taking differently: Take 75 mg by mouth every morning. 06/21/20  Yes Charlynne Cousins, MD  collagenase (SANTYL) ointment Apply 1 application topically See admin instructions. Apply to the right foot incision 03/04/21  Yes McDonald, Stephan Minister, DPM  Continuous Blood Gluc Sensor (FREESTYLE LIBRE 14 DAY SENSOR) MISC 1 Device by Other route every 14 (fourteen) days. 02/12/21  Yes Renato Shin, MD  docusate sodium (COLACE) 100 MG capsule Take 100 mg by mouth every morning.   Yes [provider]  ferric citrate (AURYXIA) 1 GM 210 MG(Fe) tablet Take 210 mg by mouth 2 (two) times daily with a meal.   Yes [provider]  gabapentin (NEURONTIN) 100 MG capsule Take 300 mg by mouth every morning. 10/21/20  Yes [provider]  Glucagon (GVOKE HYPOPEN 1-PACK) 1 MG/0.2ML SOAJ Inject 1 mg into the skin once as needed (low blood sugar).   Yes [provider]  Insulin Aspart, w/Niacinamide, (FIASP) 100 UNIT/ML SOLN For use in pump, total of 30 units per day 02/22/21  Yes Renato Shin, MD  Insulin Disposable Pump (OMNIPOD DASH 5 PACK PODS) MISC Use as instructed: use pod every 3 days. 01/30/21  Yes Renato Shin, MD  Insulin Pen Needle (PEN NEEDLES) 31G X 6 MM MISC 1 Device by Does not apply route in the morning, at noon, in the evening, and at bedtime. 12/12/20  Yes Renato Shin, MD  iron sucrose in sodium chloride 0.9 % 100 mL Iron Sucrose (Venofer) 01/23/21  Yes [provider]  lidocaine-prilocaine (EMLA) cream Apply 1 application topically See admin instructions. Apply topically to port  access one hour prior to dialysis on Sunday, Monday, Wednesday, Thursday 05/12/20  Yes [provider]  Methoxy PEG-Epoetin Beta (MIRCERA IJ) Inject into the skin every 30 (thirty) days. 07/03/20  Yes [provider]  multivitamin (RENA-VIT) TABS tablet Take 1 tablet by mouth every morning.   Yes [provider]  mupirocin ointment (BACTROBAN) 2 % Apply 1 application topically 2 (two) times daily. 11/30/20  Yes McDonald, Stephan Minister, DPM  omeprazole (PRILOSEC) 40 MG capsule Take 40 mg by mouth every morning.   Yes [provider]  rosuvastatin (CRESTOR) 10 MG tablet TAKE 1 TABLET BY MOUTH DAILY Patient taking differently: Take 10 mg by mouth every morning. 08/17/20  Yes Waynetta Sandy, MD   Current Facility-Administered Medications  Medication Dose Route Frequency Provider Last Rate Last Admin  . 0.9 %  sodium chloride infusion  250 mL Intravenous PRN Waynetta Sandy, MD      . 0.9 %  sodium chloride infusion  250 mL Intravenous PRN Waynetta Sandy, MD      . acetaminophen (TYLENOL) tablet 650 mg  650 mg Oral Q6H PRN Elwyn Reach, MD  650 mg at 03/08/21 1324   Or  . acetaminophen (TYLENOL) suppository 650 mg  650 mg Rectal Q6H PRN Gala Romney L, MD      . ferric citrate (AURYXIA) tablet 210 mg  210 mg Oral BID WC Samtani, Jai-Gurmukh, MD      . heparin injection 5,000 Units  5,000 Units Subcutaneous Q8H Elwyn Reach, MD   5,000 Units at 03/08/21 1318  . insulin aspart (novoLOG) injection 0-5 Units  0-5 Units Subcutaneous QHS Garba, Mohammad L, MD      . insulin aspart (novoLOG) injection 0-6 Units  0-6 Units Subcutaneous TID WC Elwyn Reach, MD      . Derrill Memo ON 03/09/2021] meropenem (MERREM) 500 mg in sodium chloride 0.9 % 100 mL IVPB  500 mg Intravenous Q24H Bertis Ruddy, RPH      . ondansetron (ZOFRAN) tablet 4 mg  4 mg Oral Q6H PRN Elwyn Reach, MD       Or  . ondansetron (ZOFRAN) injection 4 mg  4 mg  Intravenous Q6H PRN Gala Romney L, MD      . pantoprazole (PROTONIX) EC tablet 40 mg  40 mg Oral Daily Nita Sells, MD   40 mg at 03/08/21 1253  . potassium chloride SA (KLOR-CON) CR tablet 40 mEq  40 mEq Oral BID Nita Sells, MD   40 mEq at 03/08/21 1253  . rosuvastatin (CRESTOR) tablet 10 mg  10 mg Oral q morning Nita Sells, MD   10 mg at 03/08/21 1253  . sodium chloride flush (NS) 0.9 % injection 3 mL  3 mL Intravenous Q12H Waynetta Sandy, MD      . vancomycin variable dose per unstable renal function (pharmacist dosing)   Does not apply See admin instructions Duanne Limerick, [facility]       Current Outpatient Medications  Medication Sig Dispense Refill  . aspirin EC 81 MG EC tablet Take 1 tablet (81 mg total) by mouth daily. Swallow whole. (Patient taking differently: Take 81 mg by mouth every morning. Swallow whole.) 30 tablet 11  . clopidogrel (PLAVIX) 75 MG tablet Take 1 tablet (75 mg total) by mouth daily with breakfast. (Patient taking differently: Take 75 mg by mouth every morning.) 30 tablet 3  . collagenase (SANTYL) ointment Apply 1 application topically See admin instructions. Apply to the right foot incision 90 g 2  . Continuous Blood Gluc Sensor (FREESTYLE LIBRE 14 DAY SENSOR) MISC 1 Device by Other route every 14 (fourteen) days. 6 each 3  . docusate sodium (COLACE) 100 MG capsule Take 100 mg by mouth every morning.    . ferric citrate (AURYXIA) 1 GM 210 MG(Fe) tablet Take 210 mg by mouth 2 (two) times daily with a meal.    . gabapentin (NEURONTIN) 100 MG capsule Take 300 mg by mouth every morning.    . Glucagon (GVOKE HYPOPEN 1-PACK) 1 MG/0.2ML SOAJ Inject 1 mg into the skin once as needed (low blood sugar).    . Insulin Aspart, w/Niacinamide, (FIASP) 100 UNIT/ML SOLN For use in pump, total of 30 units per day 90 mL 0  . Insulin Disposable Pump (OMNIPOD DASH 5 PACK PODS) MISC Use as instructed: use pod every 3 days. 30 each 3  . Insulin Pen  Needle (PEN NEEDLES) 31G X 6 MM MISC 1 Device by Does not apply route in the morning, at noon, in the evening, and at bedtime. 360 each 3  . iron sucrose in sodium chloride 0.9 % 100 mL  Iron Sucrose (Venofer)    . lidocaine-prilocaine (EMLA) cream Apply 1 application topically See admin instructions. Apply topically to port access one hour prior to dialysis on Sunday, Monday, Wednesday, Thursday    . Methoxy PEG-Epoetin Beta (MIRCERA IJ) Inject into the skin every 30 (thirty) days.    . multivitamin (RENA-VIT) TABS tablet Take 1 tablet by mouth every morning.    . mupirocin ointment (BACTROBAN) 2 % Apply 1 application topically 2 (two) times daily. 15 g 2  . omeprazole (PRILOSEC) 40 MG capsule Take 40 mg by mouth every morning.    . rosuvastatin (CRESTOR) 10 MG tablet TAKE 1 TABLET BY MOUTH DAILY (Patient taking differently: Take 10 mg by mouth every morning.) 30 tablet 11   Labs: Basic Metabolic Panel: Recent Labs  Lab 03/07/21 2017 03/08/21 1203  NA 133* 133*  K 2.7* 3.0*  CL 92* 93*  CO2 31 27  GLUCOSE 85 152*  BUN 25* 36*  CREATININE 5.13* 6.35*  CALCIUM 9.0 8.5*   Liver Function Tests: Recent Labs  Lab 03/07/21 2017 03/08/21 1203  AST 40 58*  ALT 25 27  ALKPHOS 113 96  BILITOT 0.6 0.7  PROT 6.1* 6.0*  ALBUMIN 2.9* 2.9*   No results for input(s): LIPASE, AMYLASE in the last 168 hours. No results for input(s): AMMONIA in the last 168 hours. CBC: Recent Labs  Lab 03/07/21 2017 03/08/21 1203  WBC 10.7* 11.8*  NEUTROABS 9.2* 10.1*  HGB 9.6* 10.1*  HCT 27.7* 29.4*  MCV 90.5 92.7  PLT 154 175   Cardiac Enzymes: No results for input(s): CKTOTAL, CKMB, CKMBINDEX, TROPONINI in the last 168 hours. CBG: Recent Labs  Lab 03/07/21 2003 03/08/21 0058 03/08/21 0551  GLUCAP 84 123* 194*   Iron Studies: No results for input(s): IRON, TIBC, TRANSFERRIN, FERRITIN in the last 72 hours. Studies/Results: DG Chest Port 1 View  Result Date: 03/07/2021 CLINICAL DATA:  Fever.  EXAM: PORTABLE CHEST 1 VIEW COMPARISON:  01/11/2021 FINDINGS: Upper normal heart size. Unchanged mediastinal contours. Aortic atherosclerosis. No focal airspace disease. No pleural effusion or pneumothorax. No acute osseous abnormalities are seen. IMPRESSION: No acute abnormality or evidence of pneumonia. Electronically Signed   By: Keith Rake M.D.   On: 03/07/2021 20:54   DG Foot 2 Views Right  Result Date: 03/07/2021 CLINICAL DATA:  Fevers EXAM: RIGHT FOOT - 2 VIEW COMPARISON:  01/12/2021 FINDINGS: Previous transmetatarsal amputation of the first metatarsal is noted. Fractures involving the second and third metatarsal heads are seen. Soft tissue irregularity is noted at the amputation site although no definitive bony erosive changes are seen. IMPRESSION: Prior surgical changes with soft tissue wound. No findings to suggest osteomyelitis are seen. Fractures of the distal aspect of the second and third metatarsals are seen new from the prior exam. Electronically Signed   By: Inez Catalina M.D.   On: 03/07/2021 20:56    ROS: As per HPI otherwise negative.   Physical Exam: Vitals:   03/08/21 0903 03/08/21 1030 03/08/21 1042 03/08/21 1130  BP: (!) 82/65 (!) 80/49  116/62  Pulse: 96 90  97  Resp: 17 13  16   Temp:   98.8 F (37.1 C)   TempSrc:   Oral   SpO2: 95% (!) 84%  98%  Weight:      Height:         General: Chronically ill appearing male in no acute distress. Head: Normocephalic, atraumatic, sclera non-icteric, mucus membranes are moist Neck: Supple. JVD not elevated. Lungs: Clear  bilaterally to auscultation without wheezes, rales, or rhonchi. Breathing is unlabored. Heart: RRR with S1 S2. No murmurs, rubs, or gallops appreciated. Abdomen: Soft, non-tender, non-distended with normoactive bowel sounds. No rebound/guarding. No obvious abdominal masses. M-S:  Strength and tone appear normal for age. Lower extremities:No LE edema LLE Trace RLE edema. Drsg intact R foot.  Neuro: Alert  and oriented X 3. Moves all extremities spontaneously. Psych:  Responds to questions appropriately with a normal affect. Dialysis Access: L AVF + bruit  Dialysis Orders: Center: [place] with PureFlow 4X Week, MonTueThuFri, CAR 170, Therapy Fluid 2.0 K 45 Lactate, Volume per Tx 50 (liters), Flow Fraction 71 %, BFR 400, EDW 76 (kg) -Heparin 3000 units per treatment    Assessment/Plan: 1.  Sepsis D/T Diabetic foot wound. ABX per primary. Per primary/Podiatry 2.  Hypokalemia-K+3.0. K Dur 40 meq ordered per primary.  3.  ESRD -  Home hemo patient. No acute dialysis needs today. Will have HD on MWF schedule while in hospital. K+ low. No 4.0 K baths available. Will have to use 3.0 K bath and monitor K+ closely. Uses buttonholes.  4.  Hypertension/volume  - Hypotensive on admission. Without evidence of volume overload. RLE edema probably related to R foot wound. Minimal UF with HD tomorrow.  5.  Anemia  - HGB 9.6 on admit, 10.1 today. Last ESA Mircera 02/13/21 50 mcg IV. No ESA for now. Follow trends.  6.  Metabolic bone disease - On Auryxia binders. No VDRA on medication list from [place].  7.  Nutrition - Albumin low. Changed to renal/carb mod diet with fluid restrictions. Add prosource and renal vits. 8. DMT2-per primary 9. H/O Hep C 10. H/O COPD, tobacco use.  11. H/O PAD on clopidogrel.  Rc Amison H. Owens Shark, NP-C 03/08/2021, 1:45 PM  [place]

## 2021-03-08 NOTE — Telephone Encounter (Signed)
Patients wife called in to let you know he is currently at [facility] and the foot is infected and would like to know if possible you could drop in to check on the patient. Was transported [place] ambulance and had low grade fever and he will be admitted, Wife has also requested return call, Please Advise  Belenda Cruise [phone]

## 2021-03-08 NOTE — ED Notes (Signed)
I have called his wife  And gave her an update

## 2021-03-08 NOTE — Telephone Encounter (Signed)
Called [name] back and she said thanks for the follow up

## 2021-03-08 NOTE — ED Notes (Addendum)
Patient transported to MRI 

## 2021-03-08 NOTE — Progress Notes (Signed)
HOSPITAL MEDICINE OVERNIGHT EVENT NOTE    Notified by nursing that patient is continuing to exhibit systolic blood pressures in the 80s with maps in the 50s despite 3 L of lactated Ringer solution already being administered in the emergency department.    Chart reviewed, patient is currently being treated for sepsis with concerns for possible gangrene of the right foot.  Patient is currently only receiving vancomycin monotherapy, managed by pharmacy.  Patient did receive a one-time dose of ceftriaxone in the emergency department by the emergency department staff.  Considering concerns for possible gangrene, will order intravenous meropenem for broad-spectrum gram-negative and anaerobic coverage in addition to vancomycin.  I discontinued the patient's continuous IV fluid order considering patient's history of end-stage renal disease.  I have instead ordered a 500 cc lactated Ringer bolus.  Blood pressure will be reassessed after completion of bolus and patient will be rebolused as deemed appropriate.  Additionally, we will check a cortisol level.   Vernelle Emerald  MD [facility]

## 2021-03-08 NOTE — Progress Notes (Signed)
Received diabetes coordinator consult. Noted that patient has been on an Omnipod insulin pump at home.  Insulin pump settings (from last admission on 01/12/21): Basal 0.1 unit/hr. CHO ratio: 1:30 ISF 100 Target BS range: 100 with correction over 120  Patient has been confused. Staff RN states that the wife took the insulin pump off patient and took it home with her. Noted that SQ insulin orders have been written. Agree with insulin orders of Novolog 0-6 units TID, 0-5 units at HS.   Will continue to monitor blood sugars while in the hospital.  Harvel Ricks RN BSN CDE Diabetes Coordinator Pager: [phone]  8am-5pm

## 2021-03-08 NOTE — Progress Notes (Signed)
Pharmacy Antibiotic Note  Duane Hall is a 65 y.o. male admitted on 03/07/2021 with sepsis.  Pharmacy has been consulted for Merrem dosing. Already on vancomycin. Pt tachycardic, febrile, and hypotensive. Noted renal dysfunction.   Plan: Add Merrem 1g IV q12h  Height: 5\' 9"  (175.3 cm) Weight: 75 kg (165 lb 5.5 oz) IBW/kg (Calculated) : 70.7  Temp (24hrs), Avg:101.3 F (38.5 C), Min:99.1 F (37.3 C), Max:103 F (39.4 C)  Recent Labs  Lab 03/07/21 2017  WBC 10.7*  CREATININE 5.13*  LATICACIDVEN 2.0*    Estimated Creatinine Clearance: 14.7 mL/min (A) (by C-G formula based on SCr of 5.13 mg/dL (H)).    Allergies  Allergen Reactions  . Amoxicillin-Pot Clavulanate Diarrhea and Other (See Comments)  . Midodrine Other (See Comments)    Other reaction(s): Urinary Sensation  . Tape Other (See Comments)    bandaids cause blistering    Narda Bonds, PharmD, [place] Clinical Pharmacist Phone: [phone]

## 2021-03-08 NOTE — ED Notes (Signed)
The last blood cultures ordered were not collected before I took over care  He has already had two antibiotics iv

## 2021-03-08 NOTE — ED Notes (Signed)
Placed bedside commode in pt room. Asked him to notify staff by pressing call bell when needs to use.

## 2021-03-08 NOTE — Progress Notes (Signed)
PROGRESS NOTE   Duane Hall  PPI:[id number] DOB: 01/10/1956 DOA: 03/07/2021 PCP: Pieter Partridge, PA  Brief Narrative:   65 year old white male ESRD TTS Peripheral vascular disease +  DM TY 2-gangrene  Recent hospitalization 1/13 through 1/15 with right big toe status post amputation 12/2018-status post balloon angioplasty right lower extremity--Dr. Donzetta Matters [facility] Previously on aspirin and Plavix follows chronically by podiatry Dr. Sherryle Lis   Patient on insulin pump at baseline at home Wife reports wound was looking good up to about a week ago and then suddenly has deteriorated-in addition patient more confused than normal Admitted with sepsis from prior to admission likely from diabetic foot ulcer with osteomyelitis MRI ordered on admission Noted hypokalemic on admission, white count elevated at 13-chest x-ray no signs of pneumonia Also found to have fractures of distal aspect of second third metatarsals which are new  Overnight blood pressures dropped lower started on meropenem and given IV fluid boluses Cortisol level obtained which was normal Lactic acid trending downward  Hospital-Problem based course  Fracture versus osteomyelitis of the left lower extremity Severe sepsis from prior to admission  MRI pending to confirm presence of  osteomyelitis-patient may have been ambulatory and  may this may have caused fractures ?Lisfranc  joint?-   Podiatry to see Dr. Sherryle Lis aware  Continue prn Bolus--cycle lactic acid again--cont Vanc  and Meropennem If worsening, will ask CCM to see Patient apparently has severe bladder spasms with midodrine and this is not an option DM TY 2 on insulin pump  Will resume insulin pump with diabetes  coordinators input  Patient had some low blood sugars this  morning-monitor trends as per them Please resume gabapentin in the morning if he is more awake  PVD-balloon angioplasty right lower extremity Dr. Gwenlyn Saran 1/22 See above discussions D/W Dr. Gwenlyn Saran as  well to be on standby for potential BKA amputation as he knows family pretty well Plavix from prior to admission as well as aspirin have been held in anticipation of possible surgery ESRD TTS Deferring to nephrology-personally discussed with Dr. Burnett Sheng in the room who will not ultrafiltrate at this time Replacing potassium today with K. Dur 40 twice daily Check labs magnesium included today and will recheck labs in the a.m.  DVT prophylaxis: heparin Code Status: Full Family Communication: Discussed with detail and wife at the bedside Mrs. Charleen Kirks [phone] Disposition:  Status is: Inpatient  Remains inpatient appropriate because:Hemodynamically unstable, Persistent severe electrolyte disturbances and Altered mental status   Dispo: The patient is from: Home              Anticipated d/c is to: Home              Patient currently is not medically stable to d/c.   Difficult to place patient No    Consultants:   Nephrology  Vascular surgery  Podiatry  Procedures: MRI pending  Antimicrobials: Vancomycin meropenem   Subjective: Sleepy but somewhat arousable Wife does not want his wounds touched as they were just dressed She was upset earlier that the wound was left open She tells me he is very upset and depressed about losing 1 part of his foot and is scared about losing the other but wants to ensure that we can do our best for the foot Asks about Dr. Donzetta Matters visiting  Objective: Vitals:   03/08/21 0745 03/08/21 0800 03/08/21 0815 03/08/21 0903  BP: 97/72 111/68 (!) 83/60 (!) 82/65  Pulse: (!) 106 (!) 107 100 96  Resp: 15 19 16 17   Temp:      TempSrc:      SpO2: 100% 96% 97% 95%  Weight:      Height:        Intake/Output Summary (Last 24 hours) at 03/08/2021 1012 Last data filed at 03/07/2021 2126 Gross per 24 hour  Intake 500 ml  Output --  Net 500 ml   Filed Weights   03/07/21 1943  Weight: 75 kg    Examination:  Slightly arousable but falls  right back to sleep S1-S2 no murmur no rub no gallop Abdomen soft nontender no rebound no guarding ROM intact-I did not examine wound at wife's request Neurologically intact Psych euthymic congruent and in no distress at this time  Data Reviewed: personally reviewed   CBC    Component Value Date/Time   WBC 10.7 (H) 03/07/2021 2017   RBC 3.06 (L) 03/07/2021 2017   HGB 9.6 (L) 03/07/2021 2017   HGB 9.5 (L) 06/26/2020 1527   HCT 27.7 (L) 03/07/2021 2017   HCT 27.6 (L) 06/26/2020 1527   PLT 154 03/07/2021 2017   PLT 203 06/26/2020 1527   MCV 90.5 03/07/2021 2017   MCV 93 06/26/2020 1527   MCH 31.4 03/07/2021 2017   MCHC 34.7 03/07/2021 2017   RDW 12.6 03/07/2021 2017   RDW 11.9 06/26/2020 1527   LYMPHSABS 0.5 (L) 03/07/2021 2017   LYMPHSABS 1.0 06/26/2020 1527   MONOABS 1.0 03/07/2021 2017   EOSABS 0.0 03/07/2021 2017   EOSABS 0.2 06/26/2020 1527   BASOSABS 0.0 03/07/2021 2017   BASOSABS 0.0 06/26/2020 1527   CMP Latest Ref Rng & Units 03/07/2021 01/12/2021 01/11/2021  Glucose 70 - 99 mg/dL 85 261(H) 303(H)  BUN 8 - 23 mg/dL 25(H) 20 11  Creatinine 0.61 - 1.24 mg/dL 5.13(H) 4.77(H) 3.39(H)  Sodium 135 - 145 mmol/L 133(L) 133(L) 131(L)  Potassium 3.5 - 5.1 mmol/L 2.7(LL) 3.4(L) 3.1(L)  Chloride 98 - 111 mmol/L 92(L) 92(L) 89(L)  CO2 22 - 32 mmol/L 31 26 30   Calcium 8.9 - 10.3 mg/dL 9.0 8.3(L) 9.0  Total Protein 6.5 - 8.1 g/dL 6.1(L) - 6.4(L)  Total Bilirubin 0.3 - 1.2 mg/dL 0.6 - 0.5  Alkaline Phos 38 - 126 U/L 113 - 124  AST 15 - 41 U/L 40 - 22  ALT 0 - 44 U/L 25 - 22     Radiology Studies: DG Chest Port 1 View  Result Date: 03/07/2021 CLINICAL DATA:  Fever. EXAM: PORTABLE CHEST 1 VIEW COMPARISON:  01/11/2021 FINDINGS: Upper normal heart size. Unchanged mediastinal contours. Aortic atherosclerosis. No focal airspace disease. No pleural effusion or pneumothorax. No acute osseous abnormalities are seen. IMPRESSION: No acute abnormality or evidence of pneumonia.  Electronically Signed   By: Keith Rake M.D.   On: 03/07/2021 20:54   DG Foot 2 Views Right  Result Date: 03/07/2021 CLINICAL DATA:  Fevers EXAM: RIGHT FOOT - 2 VIEW COMPARISON:  01/12/2021 FINDINGS: Previous transmetatarsal amputation of the first metatarsal is noted. Fractures involving the second and third metatarsal heads are seen. Soft tissue irregularity is noted at the amputation site although no definitive bony erosive changes are seen. IMPRESSION: Prior surgical changes with soft tissue wound. No findings to suggest osteomyelitis are seen. Fractures of the distal aspect of the second and third metatarsals are seen new from the prior exam. Electronically Signed   By: Inez Catalina M.D.   On: 03/07/2021 20:56     Scheduled Meds: . sodium chloride flush  3 mL Intravenous Q12H  . vancomycin variable dose per unstable renal function (pharmacist dosing)   Does not apply See admin instructions   Continuous Infusions: . sodium chloride    . sodium chloride    . [START ON 03/09/2021] meropenem (MERREM) IV    . vancomycin 500 mg (03/08/21 0931)     LOS: 1 day   Time spent: Parker, MD [facility] To contact the attending provider between 7A-7P or the covering provider during after hours 7P-7A, please log into the web site [email or web address] and access using universal [place] password for that web site. If you do not have the password, please call the hospital operator.  03/08/2021, 10:12 AM

## 2021-03-08 NOTE — ED Notes (Signed)
afmitting doctor responded with orders

## 2021-03-08 NOTE — Progress Notes (Signed)
Pts wife brought in [employer] with steak, baked potato, and rolls. Patient and wife educated on doctors orders for patient to receive a carb modified diet. Patient also requesting a soda. RN offered carb modified appropriate options and patient refused. Last blood sugar check was in the 300s (covered with SSI). Wife states this is because "yall made me take off his insulin pump". Blood sugar check ordered for HS. Nursing will continue to monitor.

## 2021-03-08 NOTE — ED Notes (Signed)
Changed pt's linens and provided a new gown, warm blankets, and briefs

## 2021-03-08 NOTE — ED Notes (Signed)
Up to the br for a bm  He was there 30 minutes

## 2021-03-08 NOTE — ED Notes (Signed)
The pt reports that his bp is usually all over the board up and down

## 2021-03-08 NOTE — Progress Notes (Addendum)
Notified by nursing that vitals are altered pulse rate in the 120s spiked fever 100.1 having copious amounts of diarrhea C. difficile ordered It is noted that he has had over 2 L of fluids in addition to the 250 cc bolus this morning and his lactic acid is rising again to 2.3 I have discussed his care with Dr. Jonnie Finner who agrees that if the patient cannot take midodrine and if the patient is NET fluid positive then patient may be heading down a path of needing CRRT and critical care management given the fact that he may be septic from his lower extremity source We also need to rule out C. difficile colitis  I have spoken with the critical care team who will evaluate the patient and he will be transferred to 64m ICU for CRRT and further management.  We are happy to reassume care of patient once stabilized physiologically Thank you  Verneita Griffes, MD [facility] 3:47 PM

## 2021-03-08 NOTE — ED Notes (Signed)
Pt ambulates to bathroom. Refuses bedside commode. Wife at bedside assisting pt.

## 2021-03-09 DIAGNOSIS — D638 Anemia in other chronic diseases classified elsewhere: Secondary | ICD-10-CM | POA: Diagnosis not present

## 2021-03-09 DIAGNOSIS — A419 Sepsis, unspecified organism: Secondary | ICD-10-CM

## 2021-03-09 DIAGNOSIS — E876 Hypokalemia: Secondary | ICD-10-CM

## 2021-03-09 DIAGNOSIS — N186 End stage renal disease: Secondary | ICD-10-CM | POA: Diagnosis not present

## 2021-03-09 DIAGNOSIS — E1165 Type 2 diabetes mellitus with hyperglycemia: Secondary | ICD-10-CM

## 2021-03-09 DIAGNOSIS — I96 Gangrene, not elsewhere classified: Secondary | ICD-10-CM

## 2021-03-09 DIAGNOSIS — R6520 Severe sepsis without septic shock: Secondary | ICD-10-CM

## 2021-03-09 LAB — GASTROINTESTINAL PANEL BY PCR, STOOL (REPLACES STOOL CULTURE)

## 2021-03-09 LAB — GLUCOSE, CAPILLARY
Glucose-Capillary: 261 mg/dL — ABNORMAL HIGH (ref 70–99)
Glucose-Capillary: 143 mg/dL — ABNORMAL HIGH (ref 70–99)
Glucose-Capillary: 131 mg/dL — ABNORMAL HIGH (ref 70–99)
Glucose-Capillary: 169 mg/dL — ABNORMAL HIGH (ref 70–99)
Glucose-Capillary: 241 mg/dL — ABNORMAL HIGH (ref 70–99)

## 2021-03-09 LAB — BASIC METABOLIC PANEL
Anion gap: 15 (ref 5–15)
BUN: 48 mg/dL — ABNORMAL HIGH (ref 8–23)
CO2: 24 mmol/L (ref 22–32)
Calcium: 8.3 mg/dL — ABNORMAL LOW (ref 8.9–10.3)
Chloride: 94 mmol/L — ABNORMAL LOW (ref 98–111)
Creatinine, Ser: 7.25 mg/dL — ABNORMAL HIGH (ref 0.61–1.24)
GFR, Estimated: 8 mL/min — ABNORMAL LOW (ref >60)
Glucose, Bld: 256 mg/dL — ABNORMAL HIGH (ref 70–99)
Potassium: 3.2 mmol/L — ABNORMAL LOW (ref 3.5–5.1)
Sodium: 133 mmol/L — ABNORMAL LOW (ref 135–145)

## 2021-03-09 LAB — CBC
HCT: 26.2 % — ABNORMAL LOW (ref 39.0–52.0)
Hemoglobin: 9.1 g/dL — ABNORMAL LOW (ref 13.0–17.0)
MCH: 31.7 pg (ref 26.0–34.0)
MCHC: 34.7 g/dL (ref 30.0–36.0)
MCV: 91.3 fL (ref 80.0–100.0)
Platelets: 152 10*3/uL (ref 150–400)
RBC: 2.87 MIL/uL — ABNORMAL LOW (ref 4.22–5.81)
RDW: 13 % (ref 11.5–15.5)
WBC: 7.8 10*3/uL (ref 4.0–10.5)
nRBC: 0 % (ref 0.0–0.2)

## 2021-03-09 LAB — PHOSPHORUS: Phosphorus: 3.1 mg/dL (ref 2.5–4.6)

## 2021-03-09 LAB — MAGNESIUM: Magnesium: 1.8 mg/dL (ref 1.7–2.4)

## 2021-03-09 LAB — LACTIC ACID, PLASMA: Lactic Acid, Venous: 0.9 mmol/L (ref 0.5–1.9)

## 2021-03-09 LAB — C DIFFICILE QUICK SCREEN W PCR REFLEX
C Diff antigen: NEGATIVE
C Diff interpretation: NOT DETECTED
C Diff toxin: NEGATIVE

## 2021-03-09 MED ORDER — BOOST / RESOURCE BREEZE PO LIQD CUSTOM
1.0000 | Freq: Three times a day (TID) | ORAL | Status: DC
  Administered 2021-03-09 – 2021-03-14 (×3): 1 via ORAL

## 2021-03-09 MED ORDER — HEPARIN SODIUM (PORCINE) 1000 UNIT/ML DIALYSIS
1000.0000 [IU] | INTRAMUSCULAR | Status: DC | PRN
Start: 2021-03-09 — End: 2021-03-09

## 2021-03-09 MED ORDER — ASPIRIN 81 MG PO CHEW
81.0000 mg | CHEWABLE_TABLET | Freq: Every day | ORAL | Status: DC
  Administered 2021-03-09 – 2021-03-16 (×7): 81 mg via ORAL
  Filled 2021-03-09 (×8): qty 1

## 2021-03-09 MED ORDER — CHLORHEXIDINE GLUCONATE CLOTH 2 % EX PADS
6.0000 | MEDICATED_PAD | Freq: Every day | CUTANEOUS | Status: DC
  Administered 2021-03-14: 6 via TOPICAL

## 2021-03-09 MED ORDER — LOPERAMIDE HCL 2 MG PO CAPS
2.0000 mg | ORAL_CAPSULE | Freq: Once | ORAL | Status: AC
  Administered 2021-03-09: 2 mg via ORAL
  Filled 2021-03-09: qty 1

## 2021-03-09 MED ORDER — SODIUM CHLORIDE 0.9 % IV SOLN: 100.0000 mL | INTRAVENOUS | Status: DC | PRN

## 2021-03-09 MED ORDER — INSULIN GLARGINE 100 UNIT/ML ~~LOC~~ SOLN
7.0000 [IU] | Freq: Every day | SUBCUTANEOUS | Status: DC
  Administered 2021-03-09: 7 [IU] via SUBCUTANEOUS
  Filled 2021-03-09 (×2): qty 0.07

## 2021-03-09 MED ORDER — LIDOCAINE-PRILOCAINE 2.5-2.5 % EX CREA: 1.0000 "application " | TOPICAL_CREAM | CUTANEOUS | Status: DC | PRN

## 2021-03-09 MED ORDER — INSULIN ASPART 100 UNIT/ML ~~LOC~~ SOLN
3.0000 [IU] | Freq: Three times a day (TID) | SUBCUTANEOUS | Status: DC
  Administered 2021-03-09 – 2021-03-16 (×13): 3 [IU] via SUBCUTANEOUS

## 2021-03-09 MED ORDER — LIDOCAINE HCL (PF) 1 % IJ SOLN: 5.0000 mL | INTRAMUSCULAR | Status: DC | PRN

## 2021-03-09 MED ORDER — POTASSIUM CHLORIDE CRYS ER 20 MEQ PO TBCR
40.0000 meq | EXTENDED_RELEASE_TABLET | Freq: Once | ORAL | Status: AC
  Administered 2021-03-09: 40 meq via ORAL
  Filled 2021-03-09: qty 2

## 2021-03-09 MED ORDER — HEPARIN SODIUM (PORCINE) 1000 UNIT/ML DIALYSIS: 3000.0000 [IU] | INTRAMUSCULAR | Status: DC | PRN

## 2021-03-09 MED ORDER — SODIUM CHLORIDE 0.9 % IV SOLN
100.0000 mL | INTRAVENOUS | Status: DC | PRN
Start: 2021-03-09 — End: 2021-03-09

## 2021-03-09 MED ORDER — DAKINS (1/4 STRENGTH) 0.125 % EX SOLN
Freq: Every day | CUTANEOUS | Status: AC
  Filled 2021-03-09 (×2): qty 473

## 2021-03-09 MED ORDER — PENTAFLUOROPROP-TETRAFLUOROETH EX AERO: 1.0000 "application " | INHALATION_SPRAY | CUTANEOUS | Status: DC | PRN

## 2021-03-09 MED ORDER — GABAPENTIN 100 MG PO CAPS
100.0000 mg | ORAL_CAPSULE | Freq: Every day | ORAL | Status: DC
  Administered 2021-03-09 – 2021-03-16 (×7): 100 mg via ORAL
  Filled 2021-03-09 (×7): qty 1

## 2021-03-09 MED ORDER — LIDOCAINE-PRILOCAINE 2.5-2.5 % EX CREA
1.0000 "application " | TOPICAL_CREAM | CUTANEOUS | Status: DC | PRN
  Filled 2021-03-09: qty 5

## 2021-03-09 MED ORDER — HEPARIN SODIUM (PORCINE) 1000 UNIT/ML IJ SOLN
INTRAMUSCULAR | Status: AC
  Administered 2021-03-09: 3000 [IU] via INTRAVENOUS_CENTRAL
  Filled 2021-03-09: qty 3

## 2021-03-09 MED ORDER — ALTEPLASE 2 MG IJ SOLR
2.0000 mg | Freq: Once | INTRAMUSCULAR | Status: DC | PRN
  Filled 2021-03-09: qty 2

## 2021-03-09 MED ORDER — PENTAFLUOROPROP-TETRAFLUOROETH EX AERO
1.0000 | INHALATION_SPRAY | CUTANEOUS | Status: DC | PRN
Start: 2021-03-09 — End: 2021-03-09

## 2021-03-09 MED ORDER — CHLORHEXIDINE GLUCONATE CLOTH 2 % EX PADS: 6.0000 | MEDICATED_PAD | Freq: Every day | CUTANEOUS | Status: DC

## 2021-03-09 MED ORDER — VANCOMYCIN HCL 750 MG/150ML IV SOLN
750.0000 mg | Freq: Once | INTRAVENOUS | Status: AC
  Administered 2021-03-09: 750 mg via INTRAVENOUS
  Filled 2021-03-09: qty 150

## 2021-03-09 MED ORDER — OXYCODONE-ACETAMINOPHEN 5-325 MG PO TABS
1.0000 | ORAL_TABLET | Freq: Once | ORAL | Status: AC
Start: 2021-03-10 — End: 2021-03-09
  Administered 2021-03-09: 1 via ORAL
  Filled 2021-03-09: qty 1

## 2021-03-09 MED ORDER — RENA-VITE PO TABS
1.0000 | ORAL_TABLET | Freq: Every day | ORAL | Status: DC
  Administered 2021-03-09 – 2021-03-15 (×6): 1 via ORAL
  Filled 2021-03-09 (×6): qty 1

## 2021-03-09 NOTE — Progress Notes (Addendum)
NAME:  Duane Hall, MRN:  [id number], DOB:  [birth year], LOS: 2 ADMISSION DATE:  03/07/2021, CONSULTATION DATE:  03/08/2021 REFERRING MD:  Barbaraann Faster, MD , CHIEF COMPLAINT:  Confusion   Brief History:  This is a 65 yo M with ESRD, T2DM, PVD s/p right great toe amputation with fever, tachycardia, and hypotension in the context of worsening right foot drainage, redness, and swelling.  History of Present Illness:  Duane Hall is a 65 yo M with ESRD, T2DM, and PVD who had a right great toe amputation on 01/12/2021 due to gangrene. Pt was stable, seeing wound care and his podiatrist until ~1.5 weeks ago when, per wife's report, his amputation site became more red with drainage. Yesterday prior to his HD session, he notes feeling cold, dizzy, and sweaty. After his HD was complete his wife noted that he was confused with an elevated temperature. Pt reports that he has no recollection of events after his HD session until yesterday evening. In the ED he was found to be hypotensive, tachycardic, and febrile to 103. Since being in the ED, pt reports watery stools that started after he received antibiotics.  Past Medical History:  ESRD (HD Su,M,W,Th) IDDM Type 2 PVD Anemia of chronic disease Secondary Hyperparathyroidism  Significant Hospital Events:    Consults:  Vascular Surgery Podiatry Nephrology  Procedures:  None  Significant Diagnostic Tests:  MRI Right Foot 3/10>> 1.Subtle erosion of the dorsal aspect of residual first metatarsal base with small amount of adjacent fluid, concerning for early osteomyelitis 2. Subacute fractures of second and third metatarsal necks. 3. Diffuse, patchy mostly periarticular marrow edema of proximal phalanges, metatarsals, and tarsal bones, suspect neuropathic arthropathy.  Micro Data:  Flu 3/10>> neg Covid 3/10>> neg BCx 3/10>> UCx 3/10>>  Antimicrobials:  Ceftriaxone 3/10>> 1 dose Vancomycin 3/10>> Meropenem 3/10>>   Interim  History / Subjective:  Pt reports continued diarrhea overnight. Improved alertness and interaction on exam. MAP>65 since 1AM, no pressors Oxygenating well on RA HD started this AM  Objective   Blood pressure 105/63, pulse 98, temperature 99.6 F (37.6 C), temperature source Oral, resp. rate 17, height 5\' 9"  (1.753 m), weight 75 kg, SpO2 96 %.       No intake or output data in the 24 hours ending 03/09/21 0832 Filed Weights   03/07/21 1943  Weight: 75 kg    Examination: General: Chronically ill appearing, lying in bed comfortably, NAD HENT: MMM, EOMI, pupils equal Lungs: CTAB, no increased work of breathing or accessory muscle use Cardiovascular: RRR, normal N8G9, 3/6 holosystolic murmur heard best at the right 2nd intercostal space consistent with right arm AV fistula Abdomen: NABS, soft, non-distended, non-tender Extremities: 2+ radial pulses, absent pulse left dorsalis pedis, minimal swelling and warmth in right lower leg, right foot great toe amputation site with continued purulence, necrosis, and surrounding erythema and warmth. Erythema and warmth in 2nd toe. Skin: warm, well perfused, CR<3s Neuro: A&Ox4, able to converse clearly in full sentences, increased level of alertness today  Resolved Hospital Problem list     Assessment & Plan:   Septic Shock, Severe Sepsis 2/2 right foot gangrene Osteomyelitis Pt with improved level of alertness, MAPs improved -no pressor or inotropic support required -Cont. Vanc and Meropenem -Vascular and Podiatry consulted in ED -plan for BKA on Tuesday, 3/15 per vascular team. Pt to remain inpatient until that time -hold plavix -restart aspirin QD -trend WBC, fever curve  ESRD Hypotensive with 4 x weekly HD. -intermittent HD  today, if not tolerated based on BP then CVVHD -f/u RFP, mag, phos  Peripheral Vascular Disease Recent balloon angioplasty and atherectomy 12/27 of right SFA and posterior tibial -Vascular consulted -cont  rosuvastatin and aspirin -hold Plavix  Diarrhea Continued diarrhea since 3/10 with concern for possible infectious collitis. -C. diff negative -f/u pathogen panel  Diabetes Mellitus Home insulin pump disconnected -CBG q4h -Lantus 7 units QD -Novolog 3 untis QHS -cont Neurontin 100 mg QD  Hypokalemia Hyponatremia Pt reports chronic hypokalemia requiring increased dietary intake. -K repleted in ED to 3.2 this AM -careful with further K and fluid administration given ESRD -further electrolyte correction with HD as needed  Anemia of Chronic Disease -hgb 9.1, baseline ~10 -cont ferric citrate -follow CBC   Best practice (evaluated daily)  Diet: Diabetic Pain/Anxiety/Delirium protocol (if indicated): None VAP protocol (if indicated): NA DVT prophylaxis: SQ Heparin GI prophylaxis: PPI Glucose control: long acting + short acting, adjust for glucose 140-180 Mobility: Bedrest Disposition: ICU  Goals of Care:   Code Status: Full  Labs   CBC: Recent Labs  Lab 03/07/21 2017 03/08/21 1203 03/08/21 1834 03/09/21 0533  WBC 10.7* 11.8* 9.8 7.8  NEUTROABS 9.2* 10.1*  --   --   HGB 9.6* 10.1* 9.4* 9.1*  HCT 27.7* 29.4* 28.4* 26.2*  MCV 90.5 92.7 92.8 91.3  PLT 154 175 171 324    Basic Metabolic Panel: Recent Labs  Lab 03/07/21 2017 03/08/21 1203 03/08/21 1834 03/09/21 0533  NA 133* 133* 130* 133*  K 2.7* 3.0* 3.2* 3.2*  CL 92* 93* 90* 94*  CO2 31 27 24 24   GLUCOSE 85 152* 368* 256*  BUN 25* 36* 41* 48*  CREATININE 5.13* 6.35* 6.79* 7.25*  CALCIUM 9.0 8.5* 8.5* 8.3*  MG  --  1.7  --  1.8  PHOS  --   --   --  3.1   GFR: Estimated Creatinine Clearance: 10.4 mL/min (A) (by C-G formula based on SCr of 7.25 mg/dL (H)). Recent Labs  Lab 03/07/21 2017 03/08/21 0345 03/08/21 1203 03/08/21 1834 03/09/21 0533  PROCALCITON  --   --  9.19  --   --   WBC 10.7*  --  11.8* 9.8 7.8  LATICACIDVEN 2.0* 1.6 2.3* 1.6 0.9    Liver Function Tests: Recent Labs  Lab  03/07/21 2017 03/08/21 1203 03/08/21 1834  AST 40 58* 57*  ALT 25 27 31   ALKPHOS 113 96 117  BILITOT 0.6 0.7 0.8  PROT 6.1* 6.0* 6.0*  ALBUMIN 2.9* 2.9* 2.8*   No results for input(s): LIPASE, AMYLASE in the last 168 hours. No results for input(s): AMMONIA in the last 168 hours.  ABG    Component Value Date/Time   TCO2 27 12/25/2020 0645     Coagulation Profile: Recent Labs  Lab 03/07/21 2017 03/08/21 1203  INR 1.4* 1.4*    Cardiac Enzymes: No results for input(s): CKTOTAL, CKMB, CKMBINDEX, TROPONINI in the last 168 hours.  HbA1C: Hemoglobin A1C  Date/Time Value Ref Range Status  02/09/2021 03:22 PM 8.9 (A) 4.0 - 5.6 % Final  01/23/2021 03:53 PM 9.0 (A) 4.0 - 5.6 % Final   Hgb A1c MFr Bld  Date/Time Value Ref Range Status  01/11/2021 09:51 PM 7.5 (H) 4.8 - 5.6 % Final    Comment:    (NOTE) Pre diabetes:          5.7%-6.4%  Diabetes:              >6.4%  Glycemic control  for   <7.0% adults with diabetes   07/27/2020 06:39 AM 7.2 (H) 4.8 - 5.6 % Final    Comment:    (NOTE) Pre diabetes:          5.7%-6.4%  Diabetes:              >6.4%  Glycemic control for   <7.0% adults with diabetes     CBG: Recent Labs  Lab 03/08/21 0058 03/08/21 0551 03/08/21 1804 03/08/21 2155 03/09/21 0723  GLUCAP 123* 194* 313* 209* 261*    Review of Systems:     Past Medical History:  He,  has a past medical history of Asthma, Cancer ([place]), Cataract, CKD (chronic kidney disease), stage III ([place]), Diabetes mellitus, Glaucoma, and Vascular insufficiency (05/2020).   Surgical History:   Past Surgical History:  Procedure Laterality Date  . ABDOMINAL AORTOGRAM W/LOWER EXTREMITY N/A 06/19/2020   Procedure: ABDOMINAL AORTOGRAM W/LOWER EXTREMITY;  Surgeon: Waynetta Sandy, MD;  Location: [facility];  Service: Cardiovascular;  Laterality: N/A;  . ABDOMINAL AORTOGRAM W/LOWER EXTREMITY Left 10/16/2020   Procedure: ABDOMINAL AORTOGRAM W/LOWER EXTREMITY;   Surgeon: Waynetta Sandy, MD;  Location: [facility];  Service: Cardiovascular;  Laterality: Left;  . ABDOMINAL AORTOGRAM W/LOWER EXTREMITY N/A 12/25/2020   Procedure: ABDOMINAL AORTOGRAM W/LOWER EXTREMITY;  Surgeon: Waynetta Sandy, MD;  Location: [facility];  Service: Cardiovascular;  Laterality: N/A;  . AMPUTATION Right 01/12/2021   Procedure: 1ST AMPUTATION RAY;  Surgeon: [name], DPM;  Location: [facility];  Service: Podiatry;  Laterality: Right;  . AV FISTULA PLACEMENT Right 09/14/2019   Procedure: Right Arm Basilic Vein transposition;  Surgeon: Angelia Mould, MD;  Location: [facility];  Service: Vascular;  Laterality: Right;  . BONE BIOPSY Left 07/29/2020   Procedure: BONE BIOPSY;  Surgeon: [name], DPM;  Location: [facility];  Service: Podiatry;  Laterality: Left;  Need bone trephines and/or large bore Giamshidi  . COLONOSCOPY    . PERIPHERAL VASCULAR ATHERECTOMY Left 06/19/2020   Procedure: PERIPHERAL VASCULAR ATHERECTOMY;  Surgeon: Waynetta Sandy, MD;  Location: [facility];  Service: Cardiovascular;  Laterality: Left;  SFA  . PERIPHERAL VASCULAR ATHERECTOMY  12/25/2020   Procedure: PERIPHERAL VASCULAR ATHERECTOMY;  Surgeon: Waynetta Sandy, MD;  Location: [facility];  Service: Cardiovascular;;  Lt. PT - Laser Lt. SFA - Laser  . PERIPHERAL VASCULAR BALLOON ANGIOPLASTY  12/25/2020   Procedure: PERIPHERAL VASCULAR BALLOON ANGIOPLASTY;  Surgeon: Waynetta Sandy, MD;  Location: [facility];  Service: Cardiovascular;;  Lt. SFA and PT  . UPPER GASTROINTESTINAL ENDOSCOPY  02/2019   Dr Benson [place]       Social History:   reports that he has been smoking cigarettes. He has been smoking about 0.30 packs per day. He has never used smokeless tobacco. He reports that he does not drink alcohol and does not use drugs.   Family History:  His family history includes Cancer in his father; Diabetes in his mother.    Allergies Allergies  Allergen Reactions  . Amoxicillin-Pot Clavulanate Diarrhea and Other (See Comments)  . Midodrine Other (See Comments)    Other reaction(s): Urinary Sensation  . Tape Other (See Comments)    bandaids cause blistering     Home Medications  Prior to Admission medications   Medication Sig Start Date End Date Taking? Authorizing Provider  aspirin EC 81 MG EC tablet Take 1 tablet (81 mg total) by mouth daily.  Swallow whole. Patient taking differently: Take 81 mg by mouth every morning. Swallow whole. 06/21/20  Yes Charlynne Cousins, MD  clopidogrel (PLAVIX) 75 MG tablet Take 1 tablet (75 mg total) by mouth daily with breakfast. Patient taking differently: Take 75 mg by mouth every morning. 06/21/20  Yes Charlynne Cousins, MD  collagenase (SANTYL) ointment Apply 1 application topically See admin instructions. Apply to the right foot incision 03/04/21  Yes McDonald, Stephan Minister, DPM  Continuous Blood Gluc Sensor (FREESTYLE LIBRE 14 DAY SENSOR) MISC 1 Device by Other route every 14 (fourteen) days. 02/12/21  Yes Renato Shin, MD  docusate sodium (COLACE) 100 MG capsule Take 100 mg by mouth every morning.   Yes [provider]  ferric citrate (AURYXIA) 1 GM 210 MG(Fe) tablet Take 210 mg by mouth 2 (two) times daily with a meal.   Yes [provider]  gabapentin (NEURONTIN) 100 MG capsule Take 300 mg by mouth every morning. 10/21/20  Yes [provider]  Glucagon (GVOKE HYPOPEN 1-PACK) 1 MG/0.2ML SOAJ Inject 1 mg into the skin once as needed (low blood sugar).   Yes [provider]  Insulin Aspart, w/Niacinamide, (FIASP) 100 UNIT/ML SOLN For use in pump, total of 30 units per day 02/22/21  Yes Renato Shin, MD  Insulin Disposable Pump (OMNIPOD DASH 5 PACK PODS) MISC Use as instructed: use pod every 3 days. 01/30/21  Yes Renato Shin, MD  Insulin Pen Needle (PEN NEEDLES) 31G X 6 MM MISC 1 Device by Does not apply route in the morning, at noon, in  the evening, and at bedtime. 12/12/20  Yes Renato Shin, MD  iron sucrose in sodium chloride 0.9 % 100 mL Iron Sucrose (Venofer) 01/23/21  Yes [provider]  lidocaine-prilocaine (EMLA) cream Apply 1 application topically See admin instructions. Apply topically to port access one hour prior to dialysis on Sunday, Monday, Wednesday, Thursday 05/12/20  Yes [provider]  Methoxy PEG-Epoetin Beta (MIRCERA IJ) Inject into the skin every 30 (thirty) days. 07/03/20  Yes [provider]  multivitamin (RENA-VIT) TABS tablet Take 1 tablet by mouth every morning.   Yes [provider]  mupirocin ointment (BACTROBAN) 2 % Apply 1 application topically 2 (two) times daily. 11/30/20  Yes McDonald, Stephan Minister, DPM  omeprazole (PRILOSEC) 40 MG capsule Take 40 mg by mouth every morning.   Yes [provider]  rosuvastatin (CRESTOR) 10 MG tablet TAKE 1 TABLET BY MOUTH DAILY Patient taking differently: Take 10 mg by mouth every morning. 08/17/20  Yes Waynetta Sandy, MD     Calvert Cantor, Medical Student 03/09/2021, 8:32 AM

## 2021-03-09 NOTE — Consult Note (Signed)
[facility] Nurse Consult Note: Patient receiving care in [facility] 2M07. Reason for Consult: "right foot wound" Wound type: Amputation site under the care of Dr. Lanae Crumbly, Podiatrist. Please direct all questions pertaining to care of this deteriorated wound to Dr. Lanae Crumbly. The [place] nurse did not see and will not follow. [name] Riles, RN, MSN, CWOCN, CNS-BC, pager [phone]

## 2021-03-09 NOTE — Plan of Care (Signed)
  Problem: Education: Goal: Knowledge of General Education information will improve Description: Including pain rating scale, medication(s)/side effects and non-pharmacologic comfort measures Outcome: Progressing   Problem: Clinical Measurements: Goal: Ability to maintain clinical measurements within normal limits will improve Outcome: Progressing Goal: Will remain free from infection Outcome: Progressing Goal: Diagnostic test results will improve Outcome: Progressing Goal: Respiratory complications will improve Outcome: Progressing Goal: Cardiovascular complication will be avoided Outcome: Progressing   Problem: Activity: Goal: Risk for activity intolerance will decrease Outcome: Progressing   Problem: Coping: Goal: Level of anxiety will decrease Outcome: Progressing   Problem: Elimination: Goal: Will not experience complications related to bowel motility Outcome: Progressing Goal: Will not experience complications related to urinary retention Outcome: Progressing   Problem: Pain Managment: Goal: General experience of comfort will improve Outcome: Progressing   Problem: Safety: Goal: Ability to remain free from injury will improve Outcome: Progressing   Problem: Skin Integrity: Goal: Risk for impaired skin integrity will decrease Outcome: Progressing   Problem: Fluid Volume: Goal: Hemodynamic stability will improve Outcome: Progressing   Problem: Clinical Measurements: Goal: Diagnostic test results will improve Outcome: Progressing Goal: Signs and symptoms of infection will decrease Outcome: Progressing   Problem: Respiratory: Goal: Ability to maintain adequate ventilation will improve Outcome: Progressing   Problem: Education: Goal: Knowledge of disease and its progression will improve Outcome: Progressing Goal: Individualized Educational Video(s) Outcome: Progressing   Problem: Fluid Volume: Goal: Compliance with measures to maintain balanced fluid  volume will improve Outcome: Progressing   Problem: Clinical Measurements: Goal: Complications related to the disease process, condition or treatment will be avoided or minimized Outcome: Progressing

## 2021-03-09 NOTE — Progress Notes (Signed)
[facility] Progress Note  Subjective: seen in ICU, on HD this am  Vitals:   03/09/21 1100 03/09/21 1130 03/09/21 1145 03/09/21 1200  BP: 112/62 113/61 (!) 100/59 (!) 103/50  Pulse:      Resp: 13 14 19 18   Temp: 99 F (37.2 C)   99 F (37.2 C)  TempSrc: Oral   Oral  SpO2:    96%  Weight:      Height:        Exam:   alert, nad , chronic ill appearing  no jvd  Chest cta bilat  Cor reg no RG  Abd soft ntnd no ascites   Ext no LE edema, R foot wrapped   Alert, NF, ox3   L AVF +bruit    OP HD: [facility]. 4 HD per week, MTuTh Fri. 76kg edw Hep 3000   Assessment/ Plan: 1. Sepsis D/T Diabetic foot wound- ABX per primary. Per primary/Podiatry 2.  Hypokalemia-K+3.0. K Dur 40 meq given today 3.  ESRD -  Home hemo patient. Getting HD this am. BP's improved. Should not need HD this weekend.   4.  Hypotension/ volume: no vol excess, under dry. Min UF w/ HD today 5.  Anemia  - HGB 9.6 on admit, 10.1 today. Last ESA Mircera 02/13/21 50 mcg IV. No ESA for now. Follow trends.  6.  Metabolic bone disease - On Auryxia binders. No VDRA on medication list from [place].  7.  Nutrition - Albumin low. Changed to renal/carb mod diet with fluid restrictions. Add prosource and renal vits. 8. DMT2-per primary 9. H/O Hep C 10. H/O COPD, tobacco use.  11. H/O PAD on clopidogrel.     Rob Doreena Maulden 03/09/2021, 1:33 PM   Recent Labs  Lab 03/08/21 1834 03/09/21 0533  K 3.2* 3.2*  BUN 41* 48*  CREATININE 6.79* 7.25*  CALCIUM 8.5* 8.3*  PHOS  --  3.1  HGB 9.4* 9.1*   Inpatient medications: . aspirin  81 mg Oral Daily  . feeding supplement  1 Container Oral TID BM  . ferric citrate  210 mg Oral BID WC  . gabapentin  100 mg Oral Daily  . heparin  5,000 Units Subcutaneous Q8H  . insulin aspart  0-5 Units Subcutaneous QHS  . insulin aspart  0-6 Units Subcutaneous TID WC  . insulin aspart  3 Units Subcutaneous TID WC  . insulin glargine  7 Units Subcutaneous  Daily  . multivitamin  1 tablet Oral QHS  . pantoprazole  40 mg Oral Daily  . rosuvastatin  10 mg Oral q morning  . sodium hypochlorite   Irrigation Daily  . vancomycin variable dose per unstable renal function (pharmacist dosing)   Does not apply See admin instructions   . meropenem (MERREM) IV    . vancomycin     acetaminophen **OR** acetaminophen, ondansetron **OR** ondansetron (ZOFRAN) IV

## 2021-03-09 NOTE — Progress Notes (Signed)
Attempted to place Prevalon boots as ordered - patient refusing. Wife asked to bring in pressure reducing boots from home.

## 2021-03-09 NOTE — Progress Notes (Signed)
Inpatient Diabetes Program Recommendations  AACE/ADA: New Consensus Statement on Inpatient Glycemic Control   Target Ranges:  Prepandial:   less than 140 mg/dL      Peak postprandial:   less than 180 mg/dL (1-2 hours)      Critically ill patients:  140 - 180 mg/dL   Results for JABEZ, MOLNER" (MRN [id number]) as of 03/09/2021 08:29  Ref. Range 03/08/2021 05:51 03/08/2021 18:04 03/08/2021 21:55 03/09/2021 07:23  Glucose-Capillary Latest Ref Range: 70 - 99 mg/dL 194 (H) 313 (H) 209 (H) 261 (H)   Review of Glycemic Control  Diabetes history: DM Outpatient Diabetes medications: OmniPod insulin pump Current orders for Inpatient glycemic control: Novolog 0-6 units TID with meals, Novolog 0-5 units QHS  Inpatient Diabetes Program Recommendations:    Insulin: Please consider ordering Lantus 7 units Q24H and  Novolog 3 units TID with meals for meal coverage if patient eats at least 50% of meals.  NOTE: Per notes in chart, noted telephone note on 02/20/21 by Dr. Loanne Drilling and basal rate was to be changed to 12A 0.5 units/hr, 2P 2.4 units/hr, 8P 0.5 units/hour (total basal of 23.4 units per day). Per note on 02/14/21, the bolus settings should be 1 unit for 16 grams of carbs and 1 unit for every 100 mg/dl above target glucose of 120 mg/dl. Patient is not currently using his insulin pump as it was removed and his wife took it home yesterday.   Thanks, Barnie Alderman, RN, MSN, CDE Diabetes Coordinator Inpatient Diabetes Program [phone] (Team Pager from 8am to 5pm)

## 2021-03-09 NOTE — Progress Notes (Signed)
Responded to chair alarm going off in patient's room. Patient up out of chair with wife's assistance. When asked what alarm was going off, I explained that it was the chair alarm and that we would like to assist patient with transfers and ambulation as he has been very unsteady on his feet and almost fell while ambulating to bathroom with walker. Patient stated that it was the walker and raised toilet seat that almost made him fall. He attempted to swiftly rise to his feet, wavered backwards and then caught himself. He then told me to watch and see that he was fine. Patient's wife attempted to intervene and explain that staff was just trying to help and patient stated that "he was being treated like an invalid." I asked if the patient need anymore assistance and was told that he did not by both himself and his wife and I exited the room.

## 2021-03-09 NOTE — Consult Note (Signed)
Reason for Consult:deteriorating right foot wound Referring Physician:  Nita Sells MD  Duane Hall is an 65 y.o. male.  HPI: He is well-known to me from the outpatient setting for multiple bilateral lower extremity wounds, severe peripheral vascular disease, diabetes, ESRD on dialysis.  He had recent partial first ray amputation and January of this year, his wound has been stable up until this week when it deteriorated rapidly.  He presented to the emergency room with septic shock.  Past Medical History:  Diagnosis Date  . Asthma   . Cancer ([facility])   . Cataract   . CKD (chronic kidney disease), stage III ([facility])   . Diabetes mellitus   . Glaucoma   . Vascular insufficiency 05/2020    Past Surgical History:  Procedure Laterality Date  . ABDOMINAL AORTOGRAM W/LOWER EXTREMITY N/A 06/19/2020   Procedure: ABDOMINAL AORTOGRAM W/LOWER EXTREMITY;  Surgeon: Waynetta Sandy, MD;  Location: [facility];  Service: Cardiovascular;  Laterality: N/A;  . ABDOMINAL AORTOGRAM W/LOWER EXTREMITY Left 10/16/2020   Procedure: ABDOMINAL AORTOGRAM W/LOWER EXTREMITY;  Surgeon: Waynetta Sandy, MD;  Location: [facility];  Service: Cardiovascular;  Laterality: Left;  . ABDOMINAL AORTOGRAM W/LOWER EXTREMITY N/A 12/25/2020   Procedure: ABDOMINAL AORTOGRAM W/LOWER EXTREMITY;  Surgeon: Waynetta Sandy, MD;  Location: [facility];  Service: Cardiovascular;  Laterality: N/A;  . AMPUTATION Right 01/12/2021   Procedure: 1ST AMPUTATION RAY;  Surgeon: [name], DPM;  Location: [facility];  Service: Podiatry;  Laterality: Right;  . AV FISTULA PLACEMENT Right 09/14/2019   Procedure: Right Arm Basilic Vein transposition;  Surgeon: Angelia Mould, MD;  Location: [facility];  Service: Vascular;  Laterality: Right;  . BONE BIOPSY Left 07/29/2020   Procedure: BONE BIOPSY;  Surgeon: [name], DPM;  Location: [facility];  Service: Podiatry;  Laterality: Left;   Need bone trephines and/or large bore Giamshidi  . COLONOSCOPY    . PERIPHERAL VASCULAR ATHERECTOMY Left 06/19/2020   Procedure: PERIPHERAL VASCULAR ATHERECTOMY;  Surgeon: Waynetta Sandy, MD;  Location: [facility];  Service: Cardiovascular;  Laterality: Left;  SFA  . PERIPHERAL VASCULAR ATHERECTOMY  12/25/2020   Procedure: PERIPHERAL VASCULAR ATHERECTOMY;  Surgeon: Waynetta Sandy, MD;  Location: [facility];  Service: Cardiovascular;;  Lt. PT - Laser Lt. SFA - Laser  . PERIPHERAL VASCULAR BALLOON ANGIOPLASTY  12/25/2020   Procedure: PERIPHERAL VASCULAR BALLOON ANGIOPLASTY;  Surgeon: Waynetta Sandy, MD;  Location: [facility];  Service: Cardiovascular;;  Lt. SFA and PT  . UPPER GASTROINTESTINAL ENDOSCOPY  02/2019   Dr Benson Norway      Family History  Problem Relation Age of Onset  . Cancer Father   . Diabetes Mother     Social History:  reports that he has been smoking cigarettes. He has been smoking about 0.30 packs per day. He has never used smokeless tobacco. He reports that he does not drink alcohol and does not use drugs.  Allergies:  Allergies  Allergen Reactions  . Amoxicillin-Pot Clavulanate Diarrhea and Other (See Comments)  . Midodrine Other (See Comments)    Other reaction(s): Urinary Sensation  . Tape Other (See Comments)    bandaids cause blistering    Medications: I have reviewed the patient's current medications.  Results for orders placed or performed during the hospital encounter of 03/07/21 (from the past 48 hour(s))  CBG monitoring, ED     Status: None   Collection Time: 03/07/21  8:03 PM  Result Value Ref Range   Glucose-Capillary 84 70 - 99 mg/dL    Comment: Glucose reference range applies only to samples taken after fasting for at least 8 hours.  Lactic acid, plasma     Status: Abnormal   Collection Time: 03/07/21  8:17 PM  Result Value Ref Range   Lactic Acid, Venous 2.0 (HH) 0.5 - 1.9 mmol/L    Comment:  CRITICAL RESULT CALLED TO, READ BACK BY AND VERIFIED WITH: Billie Lade 03/07/21 2127 WAYK Performed at [facility] [address] [place], [place] [address]   Comprehensive metabolic panel     Status: Abnormal   Collection Time: 03/07/21  8:17 PM  Result Value Ref Range   Sodium 133 (L) 135 - 145 mmol/L   Potassium 2.7 (LL) 3.5 - 5.1 mmol/L    Comment: CRITICAL RESULT CALLED TO, READ BACK BY AND VERIFIED WITH: FERRAINOLO J,RN 03/07/21 2126 WAYK    Chloride 92 (L) 98 - 111 mmol/L   CO2 31 22 - 32 mmol/L   Glucose, Bld 85 70 - 99 mg/dL    Comment: Glucose reference range applies only to samples taken after fasting for at least 8 hours.   BUN 25 (H) 8 - 23 mg/dL   Creatinine, Ser 5.13 (H) 0.61 - 1.24 mg/dL   Calcium 9.0 8.9 - 10.3 mg/dL   Total Protein 6.1 (L) 6.5 - 8.1 g/dL   Albumin 2.9 (L) 3.5 - 5.0 g/dL   AST 40 15 - 41 U/L   ALT 25 0 - 44 U/L   Alkaline Phosphatase 113 38 - 126 U/L   Total Bilirubin 0.6 0.3 - 1.2 mg/dL   GFR, Estimated 12 (L) >60 mL/min    Comment: (NOTE) Calculated using the CKD-EPI Creatinine Equation (2021)    Anion gap 10 5 - 15    Comment: Performed at [facility], [place] [address] [place], [place] [address]  CBC WITH DIFFERENTIAL     Status: Abnormal   Collection Time: 03/07/21  8:17 PM  Result Value Ref Range   WBC 10.7 (H) 4.0 - 10.5 K/uL   RBC 3.06 (L) 4.22 - 5.81 MIL/uL   Hemoglobin 9.6 (L) 13.0 - 17.0 g/dL   HCT 27.7 (L) 39.0 - 52.0 %   MCV 90.5 80.0 - 100.0 fL   MCH 31.4 26.0 - 34.0 pg   MCHC 34.7 30.0 - 36.0 g/dL   RDW 12.6 11.5 - 15.5 %   Platelets 154 150 - 400 K/uL   nRBC 0.0 0.0 - 0.2 %   Neutrophils Relative % 87 %   Neutro Abs 9.2 (H) 1.7 - 7.7 K/uL   Lymphocytes Relative 4 %   Lymphs Abs 0.5 (L) 0.7 - 4.0 K/uL   Monocytes Relative 9 %   Monocytes Absolute 1.0 0.1 - 1.0 K/uL   Eosinophils Relative 0 %   Eosinophils Absolute 0.0 0.0 - 0.5 K/uL   Basophils Relative 0 %   Basophils Absolute 0.0 0.0 - 0.1 K/uL    Immature Granulocytes 0 %   Abs Immature Granulocytes 0.03 0.00 - 0.07 K/uL    Comment: Performed at [facility], [place] [address] [place], [place] [address]  Protime-INR     Status: Abnormal   Collection Time: 03/07/21  8:17 PM  Result Value Ref Range   Prothrombin Time 16.3 (H) 11.4 - 15.2 seconds   INR 1.4 (H) 0.8 - 1.2    Comment: (NOTE) INR goal varies  based on device and disease states. Performed at [facility], [place] [address], [place], [place] [address]   APTT     Status: None   Collection Time: 03/07/21  8:17 PM  Result Value Ref Range   aPTT 31 24 - 36 seconds    Comment: Performed at [facility] [address], [place], [place] [address]  Blood Culture (routine x 2)     Status: None (Preliminary result)   Collection Time: 03/07/21  8:46 PM   Specimen: BLOOD LEFT HAND  Result Value Ref Range   Specimen Description BLOOD LEFT HAND    Special Requests      BOTTLES DRAWN AEROBIC AND ANAEROBIC Blood Culture adequate volume   Culture      NO GROWTH < 24 HOURS Performed at [facility], [place] [address], [place], [place] [address]    Report Status PENDING   CBG monitoring, ED     Status: Abnormal   Collection Time: 03/08/21 12:58 AM  Result Value Ref Range   Glucose-Capillary 123 (H) 70 - 99 mg/dL    Comment: Glucose reference range applies only to samples taken after fasting for at least 8 hours.   Comment 1 Notify RN    Comment 2 Document in Chart   Resp Panel by RT-PCR (Flu A&B, Covid) Nasopharyngeal Swab     Status: None   Collection Time: 03/08/21  2:35 AM   Specimen: Nasopharyngeal Swab; Nasopharyngeal(NP) swabs in vial transport medium  Result Value Ref Range   SARS Coronavirus 2 by RT PCR NEGATIVE NEGATIVE    Comment: (NOTE) SARS-CoV-2 target nucleic acids are NOT DETECTED.  The SARS-CoV-2 RNA is generally detectable in upper respiratory specimens during the acute phase of infection. The lowest concentration of SARS-CoV-2 viral  copies this assay can detect is 138 copies/mL. A negative result does not preclude SARS-Cov-2 infection and should not be used as the sole basis for treatment or other patient management decisions. A negative result may occur with  improper specimen collection/handling, submission of specimen other than nasopharyngeal swab, presence of viral mutation(s) within the areas targeted by this assay, and inadequate number of viral copies(<138 copies/mL). A negative result must be combined with clinical observations, patient history, and epidemiological information. The expected result is Negative.  Fact Sheet for Patients:  EntrepreneurPulse.com.au  Fact Sheet for Healthcare Providers:  IncredibleEmployment.be  This test is no t yet approved or cleared by the [place] FDA and  has been authorized for detection and/or diagnosis of SARS-CoV-2 by FDA under an Emergency Use Authorization (EUA). This EUA will remain  in effect (meaning this test can be used) for the duration of the COVID-19 declaration under Section 564(b)(1) of the Act, 21 U.S.C.section 360bbb-3(b)(1), unless the authorization is terminated  or revoked sooner.       Influenza A by PCR NEGATIVE NEGATIVE   Influenza B by PCR NEGATIVE NEGATIVE    Comment: (NOTE) The Xpert Xpress SARS-CoV-2/FLU/RSV plus assay is intended as an aid in the diagnosis of influenza from Nasopharyngeal swab specimens and should not be used as a sole basis for treatment. Nasal washings and aspirates are unacceptable for Xpert Xpress SARS-CoV-2/FLU/RSV testing.  Fact Sheet for Patients: EntrepreneurPulse.com.au  Fact Sheet for Healthcare Providers: IncredibleEmployment.be  This test is not yet approved or cleared by the [place] FDA and has been authorized for detection and/or diagnosis of SARS-CoV-2 by FDA under an Emergency Use Authorization (EUA). This EUA will  remain in  effect (meaning this test can be used) for the duration of the COVID-19 declaration under Section 564(b)(1) of the Act, 21 U.S.C. section 360bbb-3(b)(1), unless the authorization is terminated or revoked.  Performed at [facility], [place] [address] [place], [place] [address]   Cortisol, Random     Status: None   Collection Time: 03/08/21  3:40 AM  Result Value Ref Range   Cortisol, Plasma 22.9 ug/dL    Comment: (NOTE) AM    6.7 - 22.6 ug/dL PM   <10.0       ug/dL Performed at [facility] [address], [place], [place] [address]   Lactic acid, plasma     Status: None   Collection Time: 03/08/21  3:45 AM  Result Value Ref Range   Lactic Acid, Venous 1.6 0.5 - 1.9 mmol/L    Comment: Performed at [facility] [address] [place], [place] [address]  CBG monitoring, ED     Status: Abnormal   Collection Time: 03/08/21  5:51 AM  Result Value Ref Range   Glucose-Capillary 194 (H) 70 - 99 mg/dL    Comment: Glucose reference range applies only to samples taken after fasting for at least 8 hours.   Comment 1 Notify RN    Comment 2 Document in Chart   Comprehensive metabolic panel     Status: Abnormal   Collection Time: 03/08/21 12:03 PM  Result Value Ref Range   Sodium 133 (L) 135 - 145 mmol/L   Potassium 3.0 (L) 3.5 - 5.1 mmol/L   Chloride 93 (L) 98 - 111 mmol/L   CO2 27 22 - 32 mmol/L   Glucose, Bld 152 (H) 70 - 99 mg/dL    Comment: Glucose reference range applies only to samples taken after fasting for at least 8 hours.   BUN 36 (H) 8 - 23 mg/dL   Creatinine, Ser 6.35 (H) 0.61 - 1.24 mg/dL   Calcium 8.5 (L) 8.9 - 10.3 mg/dL   Total Protein 6.0 (L) 6.5 - 8.1 g/dL   Albumin 2.9 (L) 3.5 - 5.0 g/dL   AST 58 (H) 15 - 41 U/L   ALT 27 0 - 44 U/L   Alkaline Phosphatase 96 38 - 126 U/L   Total Bilirubin 0.7 0.3 - 1.2 mg/dL   GFR, Estimated 9 (L) >60 mL/min    Comment: (NOTE) Calculated using the CKD-EPI Creatinine Equation (2021)    Anion gap 13  5 - 15    Comment: Performed at [facility], [place] [address] [place], [place] [address]  CBC with Differential/Platelet     Status: Abnormal   Collection Time: 03/08/21 12:03 PM  Result Value Ref Range   WBC 11.8 (H) 4.0 - 10.5 K/uL   RBC 3.17 (L) 4.22 - 5.81 MIL/uL   Hemoglobin 10.1 (L) 13.0 - 17.0 g/dL   HCT 29.4 (L) 39.0 - 52.0 %   MCV 92.7 80.0 - 100.0 fL   MCH 31.9 26.0 - 34.0 pg   MCHC 34.4 30.0 - 36.0 g/dL   RDW 12.9 11.5 - 15.5 %   Platelets 175 150 - 400 K/uL   nRBC 0.0 0.0 - 0.2 %   Neutrophils Relative % 86 %   Neutro Abs 10.1 (H) 1.7 - 7.7 K/uL   Lymphocytes Relative 7 %   Lymphs Abs 0.8 0.7 - 4.0 K/uL   Monocytes Relative 7 %   Monocytes Absolute 0.8 0.1 - 1.0 K/uL   Eosinophils Relative  0 %   Eosinophils Absolute 0.0 0.0 - 0.5 K/uL   Basophils Relative 0 %   Basophils Absolute 0.0 0.0 - 0.1 K/uL   Immature Granulocytes 0 %   Abs Immature Granulocytes 0.05 0.00 - 0.07 K/uL    Comment: Performed at [facility] [address], [place], [place] [address]  Magnesium     Status: None   Collection Time: 03/08/21 12:03 PM  Result Value Ref Range   Magnesium 1.7 1.7 - 2.4 mg/dL    Comment: Performed at [facility], [place] [address], [place], [place] [address]  Protime-INR     Status: Abnormal   Collection Time: 03/08/21 12:03 PM  Result Value Ref Range   Prothrombin Time 16.5 (H) 11.4 - 15.2 seconds   INR 1.4 (H) 0.8 - 1.2    Comment: (NOTE) INR goal varies based on device and disease states. Performed at [facility], [place] [address] [place], [place] [address]   Procalcitonin     Status: None   Collection Time: 03/08/21 12:03 PM  Result Value Ref Range   Procalcitonin 9.19 ng/mL    Comment:        Interpretation: PCT > 2 ng/mL: Systemic infection (sepsis) is likely, unless other causes are known. (NOTE)       Sepsis PCT Algorithm           Lower Respiratory Tract                                      Infection PCT Algorithm     ----------------------------     ----------------------------         PCT < 0.25 ng/mL                PCT < 0.10 ng/mL          Strongly encourage             Strongly discourage   discontinuation of antibiotics    initiation of antibiotics    ----------------------------     -----------------------------       PCT 0.25 - 0.50 ng/mL            PCT 0.10 - 0.25 ng/mL               OR       >80% decrease in PCT            Discourage initiation of                                            antibiotics      Encourage discontinuation           of antibiotics    ----------------------------     -----------------------------         PCT >= 0.50 ng/mL              PCT 0.26 - 0.50 ng/mL               AND       <80% decrease in PCT              Encourage initiation of  antibiotics       Encourage continuation           of antibiotics    ----------------------------     -----------------------------        PCT >= 0.50 ng/mL                  PCT > 0.50 ng/mL               AND         increase in PCT                  Strongly encourage                                      initiation of antibiotics    Strongly encourage escalation           of antibiotics                                     -----------------------------                                           PCT <= 0.25 ng/mL                                                 OR                                        > 80% decrease in PCT                                      Discontinue / Do not initiate                                             antibiotics  Performed at [facility], [address], [place], [place] [address]   Lactic acid, plasma     Status: Abnormal   Collection Time: 03/08/21 12:03 PM  Result Value Ref Range   Lactic Acid, Venous 2.3 (HH) 0.5 - 1.9 mmol/L    Comment: CRITICAL RESULT CALLED TO, READ BACK BY AND VERIFIED WITH: ATTEMPT TO CALL 1429 03/08/21 A. [place] 1521 03/08/21 A. MCDOWELL Performed at [facility], [place] [address] [place], [place] [address]   Glucose, capillary     Status: Abnormal   Collection Time: 03/08/21  6:04 PM  Result Value Ref Range   Glucose-Capillary 313 (H) 70 - 99 mg/dL    Comment: Glucose reference range applies only to samples taken after fasting for at least 8 hours.  CBC     Status: Abnormal   Collection Time: 03/08/21  6:34 PM  Result Value Ref Range   WBC 9.8 4.0 - 10.5 K/uL   RBC  3.06 (L) 4.22 - 5.81 MIL/uL   Hemoglobin 9.4 (L) 13.0 - 17.0 g/dL   HCT 28.4 (L) 39.0 - 52.0 %   MCV 92.8 80.0 - 100.0 fL   MCH 30.7 26.0 - 34.0 pg   MCHC 33.1 30.0 - 36.0 g/dL   RDW 13.0 11.5 - 15.5 %   Platelets 171 150 - 400 K/uL   nRBC 0.0 0.0 - 0.2 %    Comment: Performed at [facility] [address], [place], [place] [address]  Comprehensive metabolic panel     Status: Abnormal   Collection Time: 03/08/21  6:34 PM  Result Value Ref Range   Sodium 130 (L) 135 - 145 mmol/L   Potassium 3.2 (L) 3.5 - 5.1 mmol/L   Chloride 90 (L) 98 - 111 mmol/L   CO2 24 22 - 32 mmol/L   Glucose, Bld 368 (H) 70 - 99 mg/dL    Comment: Glucose reference range applies only to samples taken after fasting for at least 8 hours.   BUN 41 (H) 8 - 23 mg/dL   Creatinine, Ser 6.79 (H) 0.61 - 1.24 mg/dL   Calcium 8.5 (L) 8.9 - 10.3 mg/dL   Total Protein 6.0 (L) 6.5 - 8.1 g/dL   Albumin 2.8 (L) 3.5 - 5.0 g/dL   AST 57 (H) 15 - 41 U/L   ALT 31 0 - 44 U/L   Alkaline Phosphatase 117 38 - 126 U/L   Total Bilirubin 0.8 0.3 - 1.2 mg/dL   GFR, Estimated 8 (L) >60 mL/min    Comment: (NOTE) Calculated using the CKD-EPI Creatinine Equation (2021)    Anion gap 16 (H) 5 - 15    Comment: Performed at [facility], [place] [address] [place], [place] [address]  Lactic acid, plasma     Status: None   Collection Time: 03/08/21  6:34 PM  Result Value Ref Range   Lactic Acid, Venous 1.6 0.5 - 1.9 mmol/L    Comment: Performed at [facility] [address] [place], [place] [address]  Cortisol-am, blood     Status: Abnormal   Collection Time: 03/08/21  6:34 PM  Result Value Ref Range   Cortisol - AM 24.7 (H) 6.7 - 22.6 ug/dL    Comment: Performed at [facility] [address], [place], [place] [address]  MRSA PCR Screening     Status: None   Collection Time: 03/08/21  6:40 PM   Specimen: Nasal Mucosa; Nasopharyngeal  Result Value Ref Range   MRSA by PCR NEGATIVE NEGATIVE    Comment:        The GeneXpert MRSA Assay (FDA approved for NASAL specimens only), is one component of a comprehensive MRSA colonization surveillance program. It is not intended to diagnose MRSA infection nor to guide or monitor treatment for MRSA infections. Performed at [facility], [place] [address], [place], [place] [address]   Glucose, capillary     Status: Abnormal   Collection Time: 03/08/21  9:55 PM  Result Value Ref Range   Glucose-Capillary 209 (H) 70 - 99 mg/dL    Comment: Glucose reference range applies only to samples taken after fasting for at least 8 hours.  Basic metabolic panel     Status: Abnormal   Collection Time: 03/09/21  5:33 AM  Result Value Ref Range   Sodium 133 (L) 135 - 145 mmol/L   Potassium 3.2 (L) 3.5 - 5.1 mmol/L   Chloride 94 (L)  98 - 111 mmol/L   CO2 24 22 - 32 mmol/L   Glucose, Bld 256 (H) 70 - 99 mg/dL    Comment: Glucose reference range applies only to samples taken after fasting for at least 8 hours.   BUN 48 (H) 8 - 23 mg/dL   Creatinine, Ser 7.25 (H) 0.61 - 1.24 mg/dL   Calcium 8.3 (L) 8.9 - 10.3 mg/dL   GFR, Estimated 8 (L) >60 mL/min    Comment: (NOTE) Calculated using the CKD-EPI Creatinine Equation (2021)    Anion gap 15 5 - 15    Comment: Performed at [facility] [address] [place], [place] [address]  Magnesium     Status: None   Collection Time: 03/09/21  5:33 AM  Result Value Ref Range   Magnesium 1.8 1.7 - 2.4 mg/dL    Comment: Performed at [facility] [address] [place], [place] [address]  Phosphorus     Status: None   Collection Time: 03/09/21  5:33 AM  Result Value Ref Range   Phosphorus 3.1 2.5 - 4.6 mg/dL    Comment: Performed at [facility] [address], [place], [place] [address]  Lactic acid, plasma     Status: None   Collection Time: 03/09/21  5:33 AM  Result Value Ref Range   Lactic Acid, Venous 0.9 0.5 - 1.9 mmol/L    Comment: Performed at [facility] [address], [place], [place] [address]  CBC     Status: Abnormal   Collection Time: 03/09/21  5:33 AM  Result Value Ref Range   WBC 7.8 4.0 - 10.5 K/uL   RBC 2.87 (L) 4.22 - 5.81 MIL/uL   Hemoglobin 9.1 (L) 13.0 - 17.0 g/dL   HCT 26.2 (L) 39.0 - 52.0 %   MCV 91.3 80.0 - 100.0 fL   MCH 31.7 26.0 - 34.0 pg   MCHC 34.7 30.0 - 36.0 g/dL   RDW 13.0 11.5 - 15.5 %   Platelets 152 150 - 400 K/uL   nRBC 0.0 0.0 - 0.2 %    Comment: Performed at [facility], [place] [address] [place], [place] [address]    MR HEEL RIGHT WO CONTRAST  Result Date: 03/08/2021 CLINICAL DATA:  Diabetic foot ulcers.  Prior amputation. EXAM: MRI OF THE RIGHT FOREFOOT WITHOUT CONTRAST; MR OF THE RIGHT HEEL WITHOUT CONTRAST TECHNIQUE: Multiplanar, multisequence MR imaging of the right foot was performed. No intravenous contrast was administered. COMPARISON:  Right foot x-rays from yesterday. FINDINGS: Despite efforts by the technologist and patient, motion artifact is present on today's exam and could not be eliminated. This reduces exam sensitivity and specificity. Bones/Joint/Cartilage Prior first ray amputation. There is subtle erosion of the dorsal aspect of the residual first metatarsal base with small amount of adjacent fluid (series 9, image 11). Diffuse patchy, mostly periarticular marrow edema involving the proximal phalanges, metatarsals, and tarsal bones. There are subacute mildly impacted fractures of the second and third metatarsal necks. No dislocation. No  joint effusion. Ligaments Toe collateral ligaments are intact. Lisfranc ligament is intact. Medial and lateral ankle ligaments are intact. Muscles and Tendons Flexor, extensor, peroneal, and Achilles tendons are intact. Increased T2 signal within the intrinsic muscles of the forefoot, nonspecific, but likely related to diabetic muscle changes. Soft tissue Large soft tissue ulceration at the great toe amputation site with several small foci of subcutaneous emphysema. Mild dorsal forefoot and bimalleolar soft tissue swelling. No  fluid collection or hematoma. No soft tissue mass. IMPRESSION: 1. Large soft tissue ulceration at the great toe amputation site with small amount of subcutaneous emphysema. Subtle erosion of the dorsal aspect of the residual first metatarsal base with small amount of adjacent fluid, concerning for early osteomyelitis. No abscess. 2. Subacute mildly impacted fractures of the second and third metatarsal necks. 3. Diffuse patchy, mostly periarticular marrow edema involving the proximal phalanges, metatarsals, and tarsal bones. Suspect neuropathic arthropathy. Electronically Signed   By: Titus Dubin M.D.   On: 03/08/2021 15:55   MR FOOT RIGHT WO CONTRAST  Result Date: 03/08/2021 CLINICAL DATA:  Diabetic foot ulcers.  Prior amputation. EXAM: MRI OF THE RIGHT FOREFOOT WITHOUT CONTRAST; MR OF THE RIGHT HEEL WITHOUT CONTRAST TECHNIQUE: Multiplanar, multisequence MR imaging of the right foot was performed. No intravenous contrast was administered. COMPARISON:  Right foot x-rays from yesterday. FINDINGS: Despite efforts by the technologist and patient, motion artifact is present on today's exam and could not be eliminated. This reduces exam sensitivity and specificity. Bones/Joint/Cartilage Prior first ray amputation. There is subtle erosion of the dorsal aspect of the residual first metatarsal base with small amount of adjacent fluid (series 9, image 11). Diffuse patchy, mostly periarticular  marrow edema involving the proximal phalanges, metatarsals, and tarsal bones. There are subacute mildly impacted fractures of the second and third metatarsal necks. No dislocation. No joint effusion. Ligaments Toe collateral ligaments are intact. Lisfranc ligament is intact. Medial and lateral ankle ligaments are intact. Muscles and Tendons Flexor, extensor, peroneal, and Achilles tendons are intact. Increased T2 signal within the intrinsic muscles of the forefoot, nonspecific, but likely related to diabetic muscle changes. Soft tissue Large soft tissue ulceration at the great toe amputation site with several small foci of subcutaneous emphysema. Mild dorsal forefoot and bimalleolar soft tissue swelling. No fluid collection or hematoma. No soft tissue mass. IMPRESSION: 1. Large soft tissue ulceration at the great toe amputation site with small amount of subcutaneous emphysema. Subtle erosion of the dorsal aspect of the residual first metatarsal base with small amount of adjacent fluid, concerning for early osteomyelitis. No abscess. 2. Subacute mildly impacted fractures of the second and third metatarsal necks. 3. Diffuse patchy, mostly periarticular marrow edema involving the proximal phalanges, metatarsals, and tarsal bones. Suspect neuropathic arthropathy. Electronically Signed   By: Titus Dubin M.D.   On: 03/08/2021 15:55   DG Chest Port 1 View  Result Date: 03/07/2021 CLINICAL DATA:  Fever. EXAM: PORTABLE CHEST 1 VIEW COMPARISON:  01/11/2021 FINDINGS: Upper normal heart size. Unchanged mediastinal contours. Aortic atherosclerosis. No focal airspace disease. No pleural effusion or pneumothorax. No acute osseous abnormalities are seen. IMPRESSION: No acute abnormality or evidence of pneumonia. Electronically Signed   By: Keith Rake M.D.   On: 03/07/2021 20:54   DG Foot 2 Views Right  Result Date: 03/07/2021 CLINICAL DATA:  Fevers EXAM: RIGHT FOOT - 2 VIEW COMPARISON:  01/12/2021 FINDINGS: Previous  transmetatarsal amputation of the first metatarsal is noted. Fractures involving the second and third metatarsal heads are seen. Soft tissue irregularity is noted at the amputation site although no definitive bony erosive changes are seen. IMPRESSION: Prior surgical changes with soft tissue wound. No findings to suggest osteomyelitis are seen. Fractures of the distal aspect of the second and third metatarsals are seen new from the prior exam. Electronically Signed   By: Inez Catalina M.D.   On: 03/07/2021 20:56    Review of Systems  Constitutional: Positive for fever and malaise/fatigue.  HENT: Negative.   Eyes: Negative.   Respiratory: Negative.   Cardiovascular: Negative.   Gastrointestinal: Positive for diarrhea.  Genitourinary: Negative.   Musculoskeletal: Negative.   Skin:       Right foot wound  Neurological: Negative.   Endo/Heme/Allergies: Negative.   Psychiatric/Behavioral: Negative.    Blood pressure (!) 92/55, pulse (!) 101, temperature 99.3 F (37.4 C), temperature source Oral, resp. rate 18, height 5\' 9"  (1.753 m), weight 75 kg, SpO2 95 %.  Vitals:   03/09/21 0600 03/09/21 0700  BP: 106/68 (!) 92/55  Pulse: 98 (!) 101  Resp: 17 18  Temp:    SpO2: 94% 95%    General AA&O x3. Normal mood and affect.  Vascular  right foot is warm with normal capillary fill time, nonpalpable pulses  Neurologic Epicritic sensation grossly absent.  Dermatologic (Wound)  large necrotic right foot wound with purulent discharge, severe malodor  Orthopedic: Motor intact BLE.    Assessment/Plan:   -Imaging: Studies independently reviewed -Antibiotics: Continue broad-spectrum antibiotics -WB Status: WBAT bilateral LE -Please offload the left heel at all times with a pillow to prevent recurrence of decubitus ulceration -Wound Care: Dress daily with Dakin's quarter strength wet-to-dry dressings daily -Unfortunately his wound has severely deteriorated to the point that I do not think limb  salvage is likely.  I discussed with him that the only partial foot amputation I could offer would be a Chopart which has poor functional ability and has a low chance of healing of the proximal portion of the incision as well considering how this current incision did not heal.  I discussed this with Dr. Donzetta Matters from vascular surgery.  We both agree that below knee amputation would be the most predictable and salvageable amputation that would get him back into a prosthetic.  I discussed with the patient and his wife.  He understandably is upset about this but understands that this would be the best chance to keep him out of the operating room and recurrent sepsis.  Will discuss with Dr. Donzetta Matters regarding timing and plan for amputation.  [name]    Best available via secure chat for questions or concerns.

## 2021-03-09 NOTE — Progress Notes (Signed)
NAME:  Duane Hall, MRN:  [id number], DOB:  1956-06-24, LOS: 2 ADMISSION DATE:  03/07/2021, CONSULTATION DATE:  03/08/2021 REFERRING MD:  Barbaraann Faster, MD , CHIEF COMPLAINT:  Confusion   Brief History:  This is a 65 yo M with ESRD, T2DM, PVD s/p right great toe amputation with fever, tachycardia, and hypotension in the context of worsening right foot drainage, redness, and swelling.  History of Present Illness:  Duane Hall is a 65 yo M with ESRD, T2DM, and PVD who had a right great toe amputation on 01/12/2021 due to gangrene. Pt was stable, seeing wound care and his podiatrist until ~1.5 weeks ago when, per wife's report, his amputation site became more red with drainage. Yesterday prior to his HD session, he notes feeling cold, dizzy, and sweaty. After his HD was complete his wife noted that he was confused with an elevated temperature. Pt reports that he has no recollection of events after his HD session until yesterday evening. In the ED he was found to be hypotensive, tachycardic, and febrile to 103. Since being in the ED, pt reports watery stools that started after he received antibiotics.  Past Medical History:  ESRD (HD Su,M,W,Th) IDDM Type 2 PVD Anemia of chronic disease Secondary Hyperparathyroidism  Significant Hospital Events:    Consults:  Vascular Surgery Podiatry Nephrology  Procedures:    Significant Diagnostic Tests:  MRI Right Foot 3/10>> 1.Subtle erosion of the dorsal aspect of residual first metatarsal base with small amount of adjacent fluid, concerning for early osteomyelitis 2. Subacute fractures of second and third metatarsal necks. 3. Diffuse, patchy mostly periarticular marrow edema of proximal phalanges, metatarsals, and tarsal bones, suspect neuropathic arthropathy.  Micro Data:  Flu 3/10>> neg Covid 3/10>> neg BCx 3/10>> UCx 3/10>>  Antimicrobials:  Ceftriaxone 3/10>> 1 dose Vancomycin 3/10>> Meropenem 3/10>>  Interim History /  Subjective:  Tmax 100.8 overnight. Diarrhea once overnight, none so far this morning. Wants a more liberalized diet since he will not eat anything on a renal diet. Not requiring vasopressors. Wife has questions about post-op care and dispo plans. HD this morning.  Objective   Blood pressure (!) 92/55, pulse (!) 101, temperature 99.3 F (37.4 C), temperature source Oral, resp. rate 18, height 5\' 9"  (1.753 m), weight 75 kg, SpO2 95 %.       No intake or output data in the 24 hours ending 03/09/21 0726 Filed Weights   03/07/21 1943  Weight: 75 kg    Examination: General: frail, chronically ill appearing man laying in bed in NAD, appears stated age HENT: [place]/AT, eyes anicteric Lungs: breathing comfortably on [place], CTAB Cardiovascular: S1S2, RRR Abdomen: soft, NT Extremities: malodorous wound R first digit with minimal blood and purulence on bandage, some surrounding erythema. No pretibial edema. Skin: warm, dry, no rashes Neuro:awake, answering questions but falling back asleep when unstimulated. Shivering.   Resolved Hospital Problem list     Assessment & Plan:    Severe sepsis 2/2 right foot gangrene Osteomyelitis R foot Pt has improved blood pressure and mentation s/p abx and IVFs. Hemodynamically stable. MRI shows early osteomyelitis. -Con't empiric Vanc and Meropenem. Will need bone biopsy at the time of surgery to narrow antibiotics. -Appreciate vascular surgery and podiatry's input. Planning for BKA next week.  -Transitions of care team consulted for help with dispo planning. -con't to hold plavix, restart aspirin. -trend WBC, fever curve  ESRD Hypotensive with 4 x weekly HD at home. -iHD today; if tolerating can SD to  tele floor -refused renal diet -cautious repletion of K+  Peripheral Vascular Disease Recent balloon angioplasty and atherectomy 12/27 of right SFA and posterior tibial -Vascular surgery consulted- appreciate their input. -cont rosuvastatin -hold  Plavix; restart aspirin  Diarrhea New onset diarrhea in ED with concern for possible c.diff. Pt denies any diarrhea prior to admission. -if recurrent diarrhea today, can recheck BMP later today -con't to monitor, but seems to have stopped  Diabetes Mellitus with hyperglycemia Home insulin pump disconnected -CBG q4h -adding lantus 7 units daily -adding 3 units novolog TIDAC -SSI -goal BG 140-180  Hypokalemia Hyponatremia Pt reports chronic hypokalemia requiring increased dietary intake. -K+ repletion -con't to monitor -liberalized diet- does not have to follow renal diet given that he is refusing to eat anything on it  Anemia of Chronic Disease -cont oral iron supplements -transfuse for Hb <7 or hemodynamically significant bleeding  Tobacco abuse -recommend cessation -nicotine replacement therapy when he is ready  Chronic pain -restart reduced dose gabapentin; monitor mental status  Best practice (evaluated daily)  Diet: diabetic Pain/Anxiety/Delirium protocol (if indicated): None VAP protocol (if indicated): NA DVT prophylaxis: SQ Heparin GI prophylaxis: PPI Glucose control: SSI Mobility: Bedrest Disposition: ICU  Goals of Care:  Code Status: Full Discussed care with his wife today; she is concerned about his ability to go home post-operatively and get to the second story of their house. She does not want him to have outpatient surgery.   Labs   CBC: Recent Labs  Lab 03/07/21 2017 03/08/21 1203 03/08/21 1834 03/09/21 0533  WBC 10.7* 11.8* 9.8 7.8  NEUTROABS 9.2* 10.1*  --   --   HGB 9.6* 10.1* 9.4* 9.1*  HCT 27.7* 29.4* 28.4* 26.2*  MCV 90.5 92.7 92.8 91.3  PLT 154 175 171 701    Basic Metabolic Panel: Recent Labs  Lab 03/07/21 2017 03/08/21 1203 03/08/21 1834 03/09/21 0533  NA 133* 133* 130* 133*  K 2.7* 3.0* 3.2* 3.2*  CL 92* 93* 90* 94*  CO2 31 27 24 24   GLUCOSE 85 152* 368* 256*  BUN 25* 36* 41* 48*  CREATININE 5.13* 6.35* 6.79* 7.25*   CALCIUM 9.0 8.5* 8.5* 8.3*  MG  --  1.7  --  1.8  PHOS  --   --   --  3.1   GFR: Estimated Creatinine Clearance: 10.4 mL/min (A) (by C-G formula based on SCr of 7.25 mg/dL (H)). Recent Labs  Lab 03/07/21 2017 03/08/21 0345 03/08/21 1203 03/08/21 1834 03/09/21 0533  PROCALCITON  --   --  9.19  --   --   WBC 10.7*  --  11.8* 9.8 7.8  LATICACIDVEN 2.0* 1.6 2.3* 1.6 0.9    Liver Function Tests: Recent Labs  Lab 03/07/21 2017 03/08/21 1203 03/08/21 1834  AST 40 58* 57*  ALT 25 27 31   ALKPHOS 113 96 117  BILITOT 0.6 0.7 0.8  PROT 6.1* 6.0* 6.0*  ALBUMIN 2.9* 2.9* 2.8*   No results for input(s): LIPASE, AMYLASE in the last 168 hours. No results for input(s): AMMONIA in the last 168 hours.  ABG    Component Value Date/Time   TCO2 27 12/25/2020 0645     Coagulation Profile: Recent Labs  Lab 03/07/21 2017 03/08/21 1203  INR 1.4* 1.4*    Cardiac Enzymes: No results for input(s): CKTOTAL, CKMB, CKMBINDEX, TROPONINI in the last 168 hours.  HbA1C: Hemoglobin A1C  Date/Time Value Ref Range Status  02/09/2021 03:22 PM 8.9 (A) 4.0 - 5.6 % Final  01/23/2021  03:53 PM 9.0 (A) 4.0 - 5.6 % Final   Hgb A1c MFr Bld  Date/Time Value Ref Range Status  01/11/2021 09:51 PM 7.5 (H) 4.8 - 5.6 % Final    Comment:    (NOTE) Pre diabetes:          5.7%-6.4%  Diabetes:              >6.4%  Glycemic control for   <7.0% adults with diabetes   07/27/2020 06:39 AM 7.2 (H) 4.8 - 5.6 % Final    Comment:    (NOTE) Pre diabetes:          5.7%-6.4%  Diabetes:              >6.4%  Glycemic control for   <7.0% adults with diabetes     CBG: Recent Labs  Lab 03/07/21 2003 03/08/21 0058 03/08/21 0551 03/08/21 1804 03/08/21 2155  GLUCAP 84 123* 194* 313* 209*     This patient is critically ill with multiple organ system failure which requires frequent high complexity decision making, assessment, support, evaluation, and titration of therapies. This was completed through  the application of advanced monitoring technologies and extensive interpretation of multiple databases. During this encounter critical care time was devoted to patient care services described in this note for 46 minutes.   Julian Hy, DO 03/09/21 11:37 AM [facility]  From 7AM- 7PM if no response to pager, please call [phone]. After hours, 7PM- 7AM, please call Elink  [phone].

## 2021-03-09 NOTE — Progress Notes (Signed)
Patient refusing bed bath and linen change despite multiple offers of assistance.

## 2021-03-09 NOTE — Consult Note (Addendum)
Hospital Consult    Reason for Consult:  Right foot wound Referring Physician:  Dr. Verlon Au MRN #:  [id number]  History of Present Illness: This is a 65 y.o. male history of right lower extremity revascularization.  He has undergone partial right foot amputation which is failed to heal.  He is now admitted with sepsis likely secondary to osteomyelitis.  Past Medical History:  Diagnosis Date  . Asthma   . Cancer ([place])   . Cataract   . CKD (chronic kidney disease), stage III ([place])   . Diabetes mellitus   . Glaucoma   . Vascular insufficiency 05/2020    Past Surgical History:  Procedure Laterality Date  . ABDOMINAL AORTOGRAM W/LOWER EXTREMITY N/A 06/19/2020   Procedure: ABDOMINAL AORTOGRAM W/LOWER EXTREMITY;  Surgeon: Waynetta Sandy, MD;  Location: [facility];  Service: Cardiovascular;  Laterality: N/A;  . ABDOMINAL AORTOGRAM W/LOWER EXTREMITY Left 10/16/2020   Procedure: ABDOMINAL AORTOGRAM W/LOWER EXTREMITY;  Surgeon: Waynetta Sandy, MD;  Location: [facility];  Service: Cardiovascular;  Laterality: Left;  . ABDOMINAL AORTOGRAM W/LOWER EXTREMITY N/A 12/25/2020   Procedure: ABDOMINAL AORTOGRAM W/LOWER EXTREMITY;  Surgeon: Waynetta Sandy, MD;  Location: [facility];  Service: Cardiovascular;  Laterality: N/A;  . AMPUTATION Right 01/12/2021   Procedure: 1ST AMPUTATION RAY;  Surgeon: [name], DPM;  Location: [facility];  Service: Podiatry;  Laterality: Right;  . AV FISTULA PLACEMENT Right 09/14/2019   Procedure: Right Arm Basilic Vein transposition;  Surgeon: Angelia Mould, MD;  Location: [facility];  Service: Vascular;  Laterality: Right;  . BONE BIOPSY Left 07/29/2020   Procedure: BONE BIOPSY;  Surgeon: [name], DPM;  Location: [facility];  Service: Podiatry;  Laterality: Left;  Need bone trephines and/or large bore Giamshidi  . COLONOSCOPY    . PERIPHERAL VASCULAR ATHERECTOMY Left 06/19/2020   Procedure: PERIPHERAL  VASCULAR ATHERECTOMY;  Surgeon: Waynetta Sandy, MD;  Location: [facility];  Service: Cardiovascular;  Laterality: Left;  SFA  . PERIPHERAL VASCULAR ATHERECTOMY  12/25/2020   Procedure: PERIPHERAL VASCULAR ATHERECTOMY;  Surgeon: Waynetta Sandy, MD;  Location: [facility];  Service: Cardiovascular;;  Lt. PT - Laser Lt. SFA - Laser  . PERIPHERAL VASCULAR BALLOON ANGIOPLASTY  12/25/2020   Procedure: PERIPHERAL VASCULAR BALLOON ANGIOPLASTY;  Surgeon: Waynetta Sandy, MD;  Location: [facility];  Service: Cardiovascular;;  Lt. SFA and PT  . UPPER GASTROINTESTINAL ENDOSCOPY  02/2019   Dr Benson Norway      Allergies  Allergen Reactions  . Amoxicillin-Pot Clavulanate Diarrhea and Other (See Comments)  . Midodrine Other (See Comments)    Other reaction(s): Urinary Sensation  . Tape Other (See Comments)    bandaids cause blistering    Prior to Admission medications   Medication Sig Start Date End Date Taking? Authorizing Provider  aspirin EC 81 MG EC tablet Take 1 tablet (81 mg total) by mouth daily. Swallow whole. Patient taking differently: Take 81 mg by mouth every morning. Swallow whole. 06/21/20  Yes Charlynne Cousins, MD  clopidogrel (PLAVIX) 75 MG tablet Take 1 tablet (75 mg total) by mouth daily with breakfast. Patient taking differently: Take 75 mg by mouth every morning. 06/21/20  Yes Charlynne Cousins, MD  collagenase (SANTYL) ointment Apply 1 application topically See admin instructions. Apply to the right foot incision 03/04/21  Yes McDonald, Stephan Minister, DPM  Continuous Blood Gluc Sensor (FREESTYLE LIBRE 14 DAY SENSOR) MISC 1 Device by  Other route every 14 (fourteen) days. 02/12/21  Yes Renato Shin, MD  docusate sodium (COLACE) 100 MG capsule Take 100 mg by mouth every morning.   Yes [provider]  ferric citrate (AURYXIA) 1 GM 210 MG(Fe) tablet Take 210 mg by mouth 2 (two) times daily with a meal.   Yes [provider]   gabapentin (NEURONTIN) 100 MG capsule Take 300 mg by mouth every morning. 10/21/20  Yes [provider]  Glucagon (GVOKE HYPOPEN 1-PACK) 1 MG/0.2ML SOAJ Inject 1 mg into the skin once as needed (low blood sugar).   Yes [provider]  Insulin Aspart, w/Niacinamide, (FIASP) 100 UNIT/ML SOLN For use in pump, total of 30 units per day 02/22/21  Yes Renato Shin, MD  Insulin Disposable Pump (OMNIPOD DASH 5 PACK PODS) MISC Use as instructed: use pod every 3 days. 01/30/21  Yes Renato Shin, MD  Insulin Pen Needle (PEN NEEDLES) 31G X 6 MM MISC 1 Device by Does not apply route in the morning, at noon, in the evening, and at bedtime. 12/12/20  Yes Renato Shin, MD  iron sucrose in sodium chloride 0.9 % 100 mL Iron Sucrose (Venofer) 01/23/21  Yes [provider]  lidocaine-prilocaine (EMLA) cream Apply 1 application topically See admin instructions. Apply topically to port access one hour prior to dialysis on Sunday, Monday, Wednesday, Thursday 05/12/20  Yes [provider]  Methoxy PEG-Epoetin Beta (MIRCERA IJ) Inject into the skin every 30 (thirty) days. 07/03/20  Yes [provider]  multivitamin (RENA-VIT) TABS tablet Take 1 tablet by mouth every morning.   Yes [provider]  mupirocin ointment (BACTROBAN) 2 % Apply 1 application topically 2 (two) times daily. 11/30/20  Yes McDonald, Stephan Minister, DPM  omeprazole (PRILOSEC) 40 MG capsule Take 40 mg by mouth every morning.   Yes [provider]  rosuvastatin (CRESTOR) 10 MG tablet TAKE 1 TABLET BY MOUTH DAILY Patient taking differently: Take 10 mg by mouth every morning. 08/17/20  Yes Waynetta Sandy, MD    Social History   Socioeconomic History  . Marital status: Married    Spouse name: Not on file  . Number of children: Not on file  . Years of education: Not on file  . Highest education level: Not on file  Occupational History  . Occupation: Chief Financial Officer  Tobacco Use  . Smoking  status: Current Every Day Smoker    Packs/day: 0.30    Types: Cigarettes  . Smokeless tobacco: Never Used  Vaping Use  . Vaping Use: Never used  Substance and Sexual Activity  . Alcohol use: No    Alcohol/week: 0.0 standard drinks  . Drug use: No  . Sexual activity: Not on file  Other Topics Concern  . Not on file  Social History Narrative   Married.    Social Determinants of Health   Financial Resource Strain: Not on file  Food Insecurity: Not on file  Transportation Needs: Not on file  Physical Activity: Not on file  Stress: Not on file  Social Connections: Not on file  Intimate Partner Violence: Not on file    Family History  Problem Relation Age of Onset  . Cancer Father   . Diabetes Mother     ROS: pain improved in right foot   Physical Examination  Vitals:   03/09/21 0729 03/09/21 0800  BP:  105/63  Pulse:    Resp:  17  Temp: 99.6 F (37.6 C)   SpO2:  96%   Body  mass index is 24.42 kg/m.  General:  nad HENT: WNL, normocephalic Pulmonary: normal non-labored breathing Cardiac: palpable popliteal pulses Abdomen:  soft, NT/ND, no masses Extremities: right foot dressing cdi Neurologic: A&O X 3; Appropriate Affect ; SENSATION: normal; MOTOR FUNCTION:  moving all extremities equally. Speech is fluent/normal   CBC    Component Value Date/Time   WBC 7.8 03/09/2021 0533   RBC 2.87 (L) 03/09/2021 0533   HGB 9.1 (L) 03/09/2021 0533   HGB 9.5 (L) 06/26/2020 1527   HCT 26.2 (L) 03/09/2021 0533   HCT 27.6 (L) 06/26/2020 1527   PLT 152 03/09/2021 0533   PLT 203 06/26/2020 1527   MCV 91.3 03/09/2021 0533   MCV 93 06/26/2020 1527   MCH 31.7 03/09/2021 0533   MCHC 34.7 03/09/2021 0533   RDW 13.0 03/09/2021 0533   RDW 11.9 06/26/2020 1527   LYMPHSABS 0.8 03/08/2021 1203   LYMPHSABS 1.0 06/26/2020 1527   MONOABS 0.8 03/08/2021 1203   EOSABS 0.0 03/08/2021 1203   EOSABS 0.2 06/26/2020 1527   BASOSABS 0.0 03/08/2021 1203   BASOSABS 0.0 06/26/2020 1527     BMET    Component Value Date/Time   NA 133 (L) 03/09/2021 0533   K 3.2 (L) 03/09/2021 0533   CL 94 (L) 03/09/2021 0533   CO2 24 03/09/2021 0533   GLUCOSE 256 (H) 03/09/2021 0533   BUN 48 (H) 03/09/2021 0533   CREATININE 7.25 (H) 03/09/2021 0533   CREATININE 2.22 (H) 09/07/2013 0845   CALCIUM 8.3 (L) 03/09/2021 0533   GFRNONAA 8 (L) 03/09/2021 0533   GFRAA 9 (L) 07/31/2020 2100    COAGS: Lab Results  Component Value Date   INR 1.4 (H) 03/08/2021   INR 1.4 (H) 03/07/2021   INR 1.1 01/11/2021     Non-Invasive Vascular Imaging:   MRI IMPRESSION: 1. Large soft tissue ulceration at the great toe amputation site with small amount of subcutaneous emphysema. Subtle erosion of the dorsal aspect of the residual first metatarsal base with small amount of adjacent fluid, concerning for early osteomyelitis. No abscess. 2. Subacute mildly impacted fractures of the second and third metatarsal necks. 3. Diffuse patchy, mostly periarticular marrow edema involving the proximal phalanges, metatarsals, and tarsal bones. Suspect neuropathic arthropathy.   ASSESSMENT/PLAN: This is a 65 y.o. male admitted with sepsis likely secondary to osteomyelitis of his right foot.  I discussed with patient foot salvage that he discussed with Dr. Sherryle Lis yesterday versus below-knee amputation.  Patient is leaning toward below-knee amputation.  This could be performed early next week.  I have attempted to contact his wife to no avail but will attempt again later.    Brandon C. Donzetta Matters, MD [facility] Office: [phone] Pager: [phone]   Addendum:  Plan is for Left BKA next Tuesday.   Brandon C. Donzetta Matters, MD

## 2021-03-09 NOTE — Progress Notes (Addendum)
Pharmacy Antibiotic Note  Duane Hall is a 65 y.o. male with PMH of ESRD on HD and insulin dependent T2DM admitted on 03/07/2021 with septic shock. He was found to be confused, dizzy, and febrile after his home HD session on 3/9 and presented to ED with hypotension and tachycardia. Right foot warm to mid shin and nonhealing necrotic appearing ulcerated lesion. Suspicion of osteomyelitis  Pharmacy has been consulted for vancomycin and meropenem dosing. Patient previously on ceftriaxone and escalated to meropenem. Patient recently exposed to IV antibiotics for right foot cellulitis with Pseudomonas growth on 01/12/21 culture of right foot. Tmax 100.8, WBC 7.8, down from 11.8. Cultures pending. Patient completing HD session today.   Plan:  Continue meropenem IV 500 mg every 24 hours. On HD days, administer post-session    Administer vancomycin IV 750 mg x1 post-HD today  Monitor vancomycin random level and adjust dosing as appropriate with HD sessions  Height: 5\' 9"  (175.3 cm) Weight: 75 kg (165 lb 5.5 oz) IBW/kg (Calculated) : 70.7  Temp (24hrs), Avg:99.5 F (37.5 C), Min:98.8 F (37.1 C), Max:100.8 F (38.2 C)  Recent Labs  Lab 03/07/21 2017 03/08/21 0345 03/08/21 1203 03/08/21 1834 03/09/21 0533  WBC 10.7*  --  11.8* 9.8 7.8  CREATININE 5.13*  --  6.35* 6.79* 7.25*  LATICACIDVEN 2.0* 1.6 2.3* 1.6 0.9    Estimated Creatinine Clearance: 10.4 mL/min (A) (by C-G formula based on SCr of 7.25 mg/dL (H)).    Allergies  Allergen Reactions  . Amoxicillin-Pot Clavulanate Diarrhea and Other (See Comments)  . Midodrine Other (See Comments)    Other reaction(s): Urinary Sensation  . Tape Other (See Comments)    bandaids cause blistering    Antimicrobials this admission: Meropenem 3/11 >>  Vancomycin 3/10 >>  Ceftriaxone 3/10 >> 3/10  Dose adjustments this admission: None  Microbiology results: 3/9 BCx: NGTD <24 hrs 3/10 MRSA PCR: Negative  Thank you for allowing pharmacy to  be a part of this patient's care.  Benna Dunks  PharmD Candidate, Class of 2022  Duane Hall 03/09/2021 10:27 AM

## 2021-03-09 NOTE — Progress Notes (Addendum)
[facility] Progress Note Patient Name: Duane Hall DOB: Jul 30, 1956 MRN: [id number]   Date of Service  03/09/2021  HPI/Events of Note  Patient has some non-specific pains from prolonged lying in bed, Tylenol failed to provide relief. Patient is also asking for something for diarrhea.  eICU Interventions  Percocet-5 1 tab po ordered. Imodium tab 2 mg po x 1 ordered.        Frederik Pear 03/09/2021, 10:47 PM

## 2021-03-10 DIAGNOSIS — M869 Osteomyelitis, unspecified: Secondary | ICD-10-CM

## 2021-03-10 DIAGNOSIS — Z720 Tobacco use: Secondary | ICD-10-CM

## 2021-03-10 DIAGNOSIS — R5381 Other malaise: Secondary | ICD-10-CM

## 2021-03-10 DIAGNOSIS — A419 Sepsis, unspecified organism: Secondary | ICD-10-CM | POA: Diagnosis not present

## 2021-03-10 DIAGNOSIS — N186 End stage renal disease: Secondary | ICD-10-CM | POA: Diagnosis not present

## 2021-03-10 DIAGNOSIS — E11621 Type 2 diabetes mellitus with foot ulcer: Secondary | ICD-10-CM | POA: Diagnosis not present

## 2021-03-10 LAB — BASIC METABOLIC PANEL
Anion gap: 9 (ref 5–15)
BUN: 22 mg/dL (ref 8–23)
CO2: 26 mmol/L (ref 22–32)
Calcium: 8.3 mg/dL — ABNORMAL LOW (ref 8.9–10.3)
Chloride: 99 mmol/L (ref 98–111)
Creatinine, Ser: 4.48 mg/dL — ABNORMAL HIGH (ref 0.61–1.24)
GFR, Estimated: 14 mL/min — ABNORMAL LOW (ref >60)
Glucose, Bld: 198 mg/dL — ABNORMAL HIGH (ref 70–99)
Potassium: 3.8 mmol/L (ref 3.5–5.1)
Sodium: 134 mmol/L — ABNORMAL LOW (ref 135–145)

## 2021-03-10 LAB — GLUCOSE, CAPILLARY
Glucose-Capillary: 225 mg/dL — ABNORMAL HIGH (ref 70–99)
Glucose-Capillary: 256 mg/dL — ABNORMAL HIGH (ref 70–99)
Glucose-Capillary: 113 mg/dL — ABNORMAL HIGH (ref 70–99)
Glucose-Capillary: 232 mg/dL — ABNORMAL HIGH (ref 70–99)
Glucose-Capillary: 214 mg/dL — ABNORMAL HIGH (ref 70–99)

## 2021-03-10 MED ORDER — PROCHLORPERAZINE EDISYLATE 10 MG/2ML IJ SOLN
10.0000 mg | Freq: Four times a day (QID) | INTRAMUSCULAR | Status: DC | PRN
  Administered 2021-03-10: 10 mg via INTRAVENOUS
  Filled 2021-03-10 (×2): qty 2

## 2021-03-10 MED ORDER — OXYCODONE-ACETAMINOPHEN 5-325 MG PO TABS
1.0000 | ORAL_TABLET | Freq: Three times a day (TID) | ORAL | Status: AC | PRN
  Administered 2021-03-10 – 2021-03-11 (×3): 1 via ORAL
  Filled 2021-03-10 (×3): qty 1

## 2021-03-10 MED ORDER — INSULIN GLARGINE 100 UNIT/ML ~~LOC~~ SOLN
10.0000 [IU] | Freq: Every day | SUBCUTANEOUS | Status: DC
  Administered 2021-03-10 – 2021-03-16 (×6): 10 [IU] via SUBCUTANEOUS
  Filled 2021-03-10 (×8): qty 0.1

## 2021-03-10 MED ORDER — LOPERAMIDE HCL 2 MG PO CAPS
2.0000 mg | ORAL_CAPSULE | Freq: Three times a day (TID) | ORAL | Status: AC | PRN
Start: 2021-03-10 — End: 2021-03-11
  Administered 2021-03-10 – 2021-03-11 (×3): 2 mg via ORAL
  Filled 2021-03-10 (×4): qty 1

## 2021-03-10 NOTE — Progress Notes (Signed)
NAME:  Duane Hall, MRN:  [id number], DOB:  September 06, 1956, LOS: 3 ADMISSION DATE:  03/07/2021, CONSULTATION DATE:  03/08/2021 REFERRING MD:  Barbaraann Faster, MD , CHIEF COMPLAINT:  Confusion   Brief History:  This is a 65 yo M with ESRD, T2DM, PVD s/p right great toe amputation with fever, tachycardia, and hypotension in the context of worsening right foot drainage, redness, and swelling.  History of Present Illness:  Duane Hall is a 65 yo M with ESRD, T2DM, and PVD who had a right great toe amputation on 01/12/2021 due to gangrene. Pt was stable, seeing wound care and his podiatrist until ~1.5 weeks ago when, per wife's report, his amputation site became more red with drainage. Yesterday prior to his HD session, he notes feeling cold, dizzy, and sweaty. After his HD was complete his wife noted that he was confused with an elevated temperature. Pt reports that he has no recollection of events after his HD session until yesterday evening. In the ED he was found to be hypotensive, tachycardic, and febrile to 103. Since being in the ED, pt reports watery stools that started after he received antibiotics.  Past Medical History:  ESRD (HD Su,M,W,Th) IDDM Type 2 PVD Anemia of chronic disease Secondary Hyperparathyroidism  Significant Hospital Events:    Consults:  Vascular Surgery Podiatry Nephrology  Procedures:    Significant Diagnostic Tests:  MRI Right Foot 3/10>> 1.Subtle erosion of the dorsal aspect of residual first metatarsal base with small amount of adjacent fluid, concerning for early osteomyelitis 2. Subacute fractures of second and third metatarsal necks. 3. Diffuse, patchy mostly periarticular marrow edema of proximal phalanges, metatarsals, and tarsal bones, suspect neuropathic arthropathy.  Micro Data:  Flu 3/10>> neg Covid 3/10>> neg BCx 3/10>> UCx 3/10>>  Antimicrobials:  Ceftriaxone 3/10>> 1 dose Vancomycin 3/10>> Meropenem 3/10>>  Interim History /  Subjective:  Tmax 101.4 overnight. Had some diarrhea which responded to imodium.  Wants to get OOB today.  Objective   Blood pressure 116/60, pulse 89, temperature 98.8 F (37.1 C), temperature source Oral, resp. rate 14, height 5\' 9"  (1.753 m), weight 75 kg, SpO2 92 %.        Intake/Output Summary (Last 24 hours) at 03/10/2021 0707 Last data filed at 03/10/2021 0000 Gross per 24 hour  Intake 859.86 ml  Output 1000 ml  Net -140.14 ml   Filed Weights   03/07/21 1943  Weight: 75 kg    Examination: General: chronically ill appearing man laying in bed in NAD. More interactive today. HENT: [place]/AT, eyes anicteric Lungs: breathing comfortably on [place], CTAB. No conversational dyspnea. Cardiovascular: S1S2, RRR, no murmur Abdomen: soft, NT, ND Extremities: R foot bandaged, minimal serous drainage on bandage, no bleeding Skin: warm, dry, no rashes  Neuro: awake, normal speech, answering questions appropriately, moving all extremities  Resolved Hospital Problem list     Assessment & Plan:    Severe sepsis 2/2 right foot gangrene Osteomyelitis R foot Pt has improved blood pressure and mentation s/p abx and IVFs. Hemodynamically stable. MRI shows early osteomyelitis. -Con't empiric Vanc and Meropenem. Will need bone biopsy at the time of surgery to narrow antibiotics. -Appreciate vascular surgery and podiatry's input. Planning for BKA next week with vascular surgery.  -Transitions of care team consulted for help with dispo planning-- anticipate he may need rehabilitation due to mobility issues post-op -con't to hold plavix until surgery -con't daily aspirin -trend WBC, fever curve  ESRD  Hypotensive with 4 x weekly HD at  home. -last HD on Friday; no plans for HD over the weekend. Appreciate Nephrology's input. -refused renal diet; does not follow at home  Peripheral Vascular Disease Recent balloon angioplasty and atherectomy 12/27 of right SFA and posterior tibial -Appreciate  vascular surgery's input. Con't daily aspirin and statin. Holding plavix until after OR.  Diarrhea New onset diarrhea in ED with concern for possible c.diff> negative in ED. Pt denies any diarrhea prior to admission, but thinks meds here are causing it. -con't imodium PRN  Diabetes mellitus with hyperglycemia Home insulin pump disconnected -CBG q4h -increase lantus to 10 units daily -con't 3 units novolog TIDAC -SSI -goal BG 140-180  Hypokalemia, resolved Hyponatremia Pt reports chronic hypokalemia requiring increased dietary intake. -con't to monitor -liberalized diet- does not have to follow renal diet given that he is refusing to eat anything on it  Anemia of Chronic Disease -cont oral iron supplements -transfuse for Hb <7 or hemodynamically significant bleeding  Tobacco abuse -recommend cessation -nicotine replacement therapy when he is ready  Chronic pain -restart reduced dose gabapentin; monitor mental status  Deconditionoing -PT, OT  Stable to step down from ICU today.   Best practice (evaluated daily)  Diet: diabetic Pain/Anxiety/Delirium protocol (if indicated): None VAP protocol (if indicated): NA DVT prophylaxis: SQ Heparin GI prophylaxis: PPI Glucose control: SSI Mobility: Bedrest Disposition: tele  Goals of Care:  Code Status: Full Discussed care with his wife 3/11; she is concerned about his ability to go home post-operatively and get to the second story of their house. She does not want him to have outpatient surgery.   Labs   CBC: Recent Labs  Lab 03/07/21 2017 03/08/21 1203 03/08/21 1834 03/09/21 0533  WBC 10.7* 11.8* 9.8 7.8  NEUTROABS 9.2* 10.1*  --   --   HGB 9.6* 10.1* 9.4* 9.1*  HCT 27.7* 29.4* 28.4* 26.2*  MCV 90.5 92.7 92.8 91.3  PLT 154 175 171 017    Basic Metabolic Panel: Recent Labs  Lab 03/07/21 2017 03/08/21 1203 03/08/21 1834 03/09/21 0533 03/10/21 0100  NA 133* 133* 130* 133* 134*  K 2.7* 3.0* 3.2* 3.2* 3.8   CL 92* 93* 90* 94* 99  CO2 31 27 24 24 26   GLUCOSE 85 152* 368* 256* 198*  BUN 25* 36* 41* 48* 22  CREATININE 5.13* 6.35* 6.79* 7.25* 4.48*  CALCIUM 9.0 8.5* 8.5* 8.3* 8.3*  MG  --  1.7  --  1.8  --   PHOS  --   --   --  3.1  --    GFR: Estimated Creatinine Clearance: 16.9 mL/min (A) (by C-G formula based on SCr of 4.48 mg/dL (H)). Recent Labs  Lab 03/07/21 2017 03/08/21 0345 03/08/21 1203 03/08/21 1834 03/09/21 0533  PROCALCITON  --   --  9.19  --   --   WBC 10.7*  --  11.8* 9.8 7.8  LATICACIDVEN 2.0* 1.6 2.3* 1.6 0.9    Liver Function Tests: Recent Labs  Lab 03/07/21 2017 03/08/21 1203 03/08/21 1834  AST 40 58* 57*  ALT 25 27 31   ALKPHOS 113 96 117  BILITOT 0.6 0.7 0.8  PROT 6.1* 6.0* 6.0*  ALBUMIN 2.9* 2.9* 2.8*   No results for input(s): LIPASE, AMYLASE in the last 168 hours. No results for input(s): AMMONIA in the last 168 hours.  ABG    Component Value Date/Time   TCO2 27 12/25/2020 0645     Coagulation Profile: Recent Labs  Lab 03/07/21 2017 03/08/21 1203  INR 1.4* 1.4*  Cardiac Enzymes: No results for input(s): CKTOTAL, CKMB, CKMBINDEX, TROPONINI in the last 168 hours.  HbA1C: Hemoglobin A1C  Date/Time Value Ref Range Status  02/09/2021 03:22 PM 8.9 (A) 4.0 - 5.6 % Final  01/23/2021 03:53 PM 9.0 (A) 4.0 - 5.6 % Final   Hgb A1c MFr Bld  Date/Time Value Ref Range Status  01/11/2021 09:51 PM 7.5 (H) 4.8 - 5.6 % Final    Comment:    (NOTE) Pre diabetes:          5.7%-6.4%  Diabetes:              >6.4%  Glycemic control for   <7.0% adults with diabetes   07/27/2020 06:39 AM 7.2 (H) 4.8 - 5.6 % Final    Comment:    (NOTE) Pre diabetes:          5.7%-6.4%  Diabetes:              >6.4%  Glycemic control for   <7.0% adults with diabetes     CBG: Recent Labs  Lab 03/09/21 1002 03/09/21 1120 03/09/21 1706 03/09/21 2219 03/10/21 0527  GLUCAP 143* 131* 169* 241* [place] Aubriel Khanna, DO 03/10/21 10:02 AM [place]  [facility]  From 7AM- 7PM if no response to pager, please call [phone]. After hours, 7PM- 7AM, please call Elink  [phone].

## 2021-03-10 NOTE — Progress Notes (Signed)
While doing my initial assessment of the patient, I noticed that he did not have his right foot dressing change done today on previous shift. I brought all supplies to bedside to change the foot dressing but patient is refusing at this time due to vomiting. Will try again later.

## 2021-03-10 NOTE — Progress Notes (Signed)
[facility] Progress Note  Subjective: seen in ICU, looks a bit better  Vitals:   03/10/21 0800 03/10/21 0900 03/10/21 1000 03/10/21 1121  BP: 106/61 (!) 92/54 108/62   Pulse: 82 83 89   Resp: 10 16 16    Temp:    98.9 F (37.2 C)  TempSrc:    Oral  SpO2: 96% (!) 87% 94%   Weight:      Height:        Exam:   alert, nad , chronic ill appearing  no jvd  Chest cta bilat  Cor reg no RG  Abd soft ntnd no ascites   Ext no LE edema, R foot wrapped   Alert, NF, ox3   L AVF +bruit    OP HD: [place] [facility]. 4 HD per week, MTuTh Fri. 76kg edw Hep 3000   Assessment/ Plan: 1. Sepsis D/T Diabetic foot wound- MRI +for osteo R foot. IV abx w/ improving BP's and MS. On Vanc/ meropenem. Plan is for BKA next week.  2.  Hypokalemia-K+3.0. K Dur 40 meq given today 3.  ESRD -  Home hemo patient. Had HD here 3/11 yest. Next HD Monday.  4.  Hypotension/ volume: no vol excess, under dry by 1kg.  5.  Anemia  - HGB 9.6 on admit, 10.1 today. Last ESA Mircera 02/13/21 50 mcg IV. No ESA for now. Follow trends.  6.  Metabolic bone disease - On Auryxia binders. No VDRA on medication list from [place].  7.  Nutrition - Albumin low. Changed to renal/carb mod diet with fluid restrictions. Add prosource and renal vits. 8. DMT2-per primary 9. H/O Hep C 10. H/O COPD, tobacco use.  11. H/O PAD on clopidogrel.     Rob Lakin Romer 03/10/2021, 1:13 PM   Recent Labs  Lab 03/08/21 1834 03/09/21 0533 03/10/21 0100  K 3.2* 3.2* 3.8  BUN 41* 48* 22  CREATININE 6.79* 7.25* 4.48*  CALCIUM 8.5* 8.3* 8.3*  PHOS  --  3.1  --   HGB 9.4* 9.1*  --    Inpatient medications: . aspirin  81 mg Oral Daily  . Chlorhexidine Gluconate Cloth  6 each Topical Daily  . feeding supplement  1 Container Oral TID BM  . ferric citrate  210 mg Oral BID WC  . gabapentin  100 mg Oral Daily  . heparin  5,000 Units Subcutaneous Q8H  . insulin aspart  0-5 Units Subcutaneous QHS  . insulin aspart  0-6 Units  Subcutaneous TID WC  . insulin aspart  3 Units Subcutaneous TID WC  . insulin glargine  10 Units Subcutaneous Daily  . multivitamin  1 tablet Oral QHS  . pantoprazole  40 mg Oral Daily  . rosuvastatin  10 mg Oral q morning  . sodium hypochlorite   Irrigation Daily  . vancomycin variable dose per unstable renal function (pharmacist dosing)   Does not apply See admin instructions   . meropenem (MERREM) IV 500 mg (03/10/21 0830)   acetaminophen **OR** acetaminophen, loperamide, ondansetron **OR** ondansetron (ZOFRAN) IV, oxyCODONE-acetaminophen

## 2021-03-10 NOTE — Progress Notes (Signed)
[facility] Progress Note Patient Name: Duane Hall DOB: July 20, 1956 MRN: [id number]   Date of Service  03/10/2021  HPI/Events of Note  Percocet and Imodium were effective for pain and diarrhea respectively, so patient is asking if it can be ordered PRN.  eICU Interventions  PRN orders entered for Imodium and Percocet respectively x 3 doses then d/c.        Kerry Kass Camala Talwar 03/10/2021, 6:03 AM

## 2021-03-10 NOTE — Progress Notes (Signed)
[facility] Progress Note Patient Name: Duane Hall DOB: Jan 14, 1956 MRN: [id number]   Date of Service  03/10/2021  HPI/Events of Note  Nausea/vomiting refractory to Zofran.  eICU Interventions  Ordered Compazine 10mg  IV Q6H PRN n/v.     Intervention Category Minor Interventions: Routine modifications to care plan (e.g. PRN medications for pain, fever)  Marily Lente Aviyana Sonntag 03/10/2021, 11:02 PM

## 2021-03-11 ENCOUNTER — Encounter: Payer: Self-pay | Admitting: Endocrinology

## 2021-03-11 LAB — GLUCOSE, CAPILLARY
Glucose-Capillary: 250 mg/dL — ABNORMAL HIGH (ref 70–99)
Glucose-Capillary: 188 mg/dL — ABNORMAL HIGH (ref 70–99)
Glucose-Capillary: 330 mg/dL — ABNORMAL HIGH (ref 70–99)
Glucose-Capillary: 119 mg/dL — ABNORMAL HIGH (ref 70–99)

## 2021-03-11 MED ORDER — OXYCODONE-ACETAMINOPHEN 5-325 MG PO TABS
1.0000 | ORAL_TABLET | Freq: Four times a day (QID) | ORAL | Status: DC | PRN
Start: 2021-03-11 — End: 2021-03-13
  Administered 2021-03-11 – 2021-03-13 (×3): 1 via ORAL
  Filled 2021-03-11 (×3): qty 1

## 2021-03-11 MED ORDER — LOPERAMIDE HCL 2 MG PO CAPS
2.0000 mg | ORAL_CAPSULE | ORAL | Status: DC | PRN
  Administered 2021-03-11 – 2021-03-14 (×5): 2 mg via ORAL
  Filled 2021-03-11 (×5): qty 1

## 2021-03-11 NOTE — Progress Notes (Signed)
Patient let me change his right foot dressing. See chart for treatments used.

## 2021-03-11 NOTE — Progress Notes (Signed)
PROGRESS NOTE  Duane Hall KWI:[id number] DOB: Jun 06, 1956 DOA: 03/07/2021 PCP: Pieter Partridge, PA   LOS: 4 days   Brief narrative:  Duane Hall is a 65 years old male with history of end-stage renal disease on hemodialysis, type 2 diabetes, peripheral vascular disease who had undergone amputation on 1/14 due to gangrene being followed by wound care as outpatient presented to the hospital with increasing redness and dryness at the amputation site.  Patient had chills dizziness sweatiness during hemodialysis session and was noted to be febrile and confused she was sent into the hospital.  In the ED, patient was noted to be hypotensive tachycardic with a fever of 103F.  He was then admitted to the ICU for further evaluation and treatment.  Assessment/Plan:  Principal Problem:   Sepsis ([place]) Active Problems:   Anemia of chronic disease   Hypokalemia   Diabetic foot ulcer (HCC)   Benign prostatic hyperplasia with weak urinary stream   ESRD (end stage renal disease) ([place])   Uncontrolled type 2 diabetes mellitus with hyperglycemia (HCC)   Osteomyelitis of right foot (HCC)   Gangrene (HCC)  Severe sepsis secondary to right foot gangrene/osteomyelitis R foot On vancomycin and meropenem.  Podiatry and vascular surgery was consulted.  Planning for below-knee amputation with vascular surgery.  Continue to hold the Plavix.  ESRD on hemodialysis at home. Hypotensive with 4 x weekly HD at home. Nephrology on board for hemodialysis.  Peripheral Vascular Disease Patient with recent balloon angioplasty and atherectomy 12/27 of right SFA and posterior tibial artery.  Vascular surgery on board.  Continue aspirin and statin.  Holding Plavix until amputation  Diarrhea Imodium as needed.  C. difficile negative.   Diabetes mellitus type II with hyperglycemia On insulin pump at home which has been disconnected.  Currently on Lantus 10 units and NovoLog sliding-scale insulin.  Continue  diabetic diet, Accu-Cheks, closely monitor blood glucose levels.  Hypokalemia, resolved  Hyponatremia Mild.  Will monitor.  Anemia of Chronic Disease On iron supplements.  Transfuse for hemoglobin less than 7.  Will hold iron due to current infection.  Tobacco abuse We will prescribe nicotine patch when needed  Chronic pain Has been started on low-dose gabapentin; mental status with  Deconditionoing -PT, OT consulted.  Patient is currently with right foot infection waiting for surgical intervention.  DVT prophylaxis: heparin injection 5,000 Units Start: 03/08/21 1400   Code Status: Full code  Family Communication: None  Status is: Inpatient  Remains inpatient appropriate because:IV treatments appropriate due to intensity of illness or inability to take PO, Inpatient level of care appropriate due to severity of illness and Surgical intervention pending   Dispo: The patient is from: Home              Anticipated d/c is to: Home              Patient currently is not medically stable to d/c.   Difficult to place patient No  Consultants:  PCCM  Podiatry,   vascular surgery  Procedures:  None  Anti-infectives:  Marland Kitchen Vancomycin 3/10> . Meropenem 3/10>  Anti-infectives (From admission, onward)   Start     Dose/Rate Route Frequency Ordered Stop   03/09/21 1400  vancomycin (VANCOREADY) IVPB 750 mg/150 mL        750 mg 150 mL/hr over 60 Minutes Intravenous  Once 03/09/21 0927 03/09/21 1601   03/09/21 0800  meropenem (MERREM) 500 mg in sodium chloride 0.9 % 100 mL IVPB  500 mg 200 mL/hr over 30 Minutes Intravenous Every 24 hours 03/08/21 0733     03/08/21 1145  cefTRIAXone (ROCEPHIN) 2 g in sodium chloride 0.9 % 100 mL IVPB  Status:  Discontinued        2 g 200 mL/hr over 30 Minutes Intravenous Every 24 hours 03/08/21 1134 03/08/21 1137   03/08/21 0745  vancomycin (VANCOREADY) IVPB 500 mg/100 mL        500 mg 100 mL/hr over 60 Minutes Intravenous  Once  03/08/21 0733 03/08/21 1102   03/08/21 0400  meropenem (MERREM) 1 g in sodium chloride 0.9 % 100 mL IVPB  Status:  Discontinued        1 g 200 mL/hr over 30 Minutes Intravenous Every 12 hours 03/08/21 0349 03/08/21 0733   03/07/21 2208  vancomycin variable dose per unstable renal function (pharmacist dosing)         Does not apply See admin instructions 03/07/21 2208     03/07/21 2030  vancomycin (VANCOREADY) IVPB 1000 mg/200 mL        1,000 mg 200 mL/hr over 60 Minutes Intravenous  Once 03/07/21 2018 03/07/21 2230   03/07/21 2030  cefTRIAXone (ROCEPHIN) 2 g in sodium chloride 0.9 % 100 mL IVPB        2 g 200 mL/hr over 30 Minutes Intravenous  Once 03/07/21 2018 03/07/21 2110       Subjective: Today, patient was seen and examined at bedside.  Patient appears to be slightly sleepy today.  Feels tired and chilly.  Objective: Vitals:   03/11/21 0700 03/11/21 1150  BP: 103/69 (!) 70/47  Pulse:  84  Resp:  20  Temp: 98 F (36.7 C) 98.4 F (36.9 C)  SpO2: 95%     Intake/Output Summary (Last 24 hours) at 03/11/2021 1350 Last data filed at 03/11/2021 0400 Gross per 24 hour  Intake 670 ml  Output --  Net 670 ml   Filed Weights   03/07/21 1943  Weight: 75 kg   Body mass index is 24.42 kg/m.   Physical Exam: GENERAL: Patient is mildly somnolent, communicative, not in obvious distress, chronically ill, HENT: No scleral pallor or icterus. Pupils equally reactive to light. Oral mucosa is moist NECK: is supple, no gross swelling noted. CHEST: Clear to auscultation. No crackles or wheezes.  Diminished breath sounds bilaterally. CVS: S1 and S2 heard, no murmur. Regular rate and rhythm.  ABDOMEN: Soft, non-tender, bowel sounds are present. EXTREMITIES: No edema.  Right foot bandaged.  Left upper extremity AV fistula CNS: Cranial nerves are intact.  Moves extremities, SKIN: warm and dry without rashes.  Data Review: I have personally reviewed the following laboratory data and  studies,  CBC: Recent Labs  Lab 03/07/21 2017 03/08/21 1203 03/08/21 1834 03/09/21 0533  WBC 10.7* 11.8* 9.8 7.8  NEUTROABS 9.2* 10.1*  --   --   HGB 9.6* 10.1* 9.4* 9.1*  HCT 27.7* 29.4* 28.4* 26.2*  MCV 90.5 92.7 92.8 91.3  PLT 154 175 171 921   Basic Metabolic Panel: Recent Labs  Lab 03/07/21 2017 03/08/21 1203 03/08/21 1834 03/09/21 0533 03/10/21 0100  NA 133* 133* 130* 133* 134*  K 2.7* 3.0* 3.2* 3.2* 3.8  CL 92* 93* 90* 94* 99  CO2 31 27 24 24 26   GLUCOSE 85 152* 368* 256* 198*  BUN 25* 36* 41* 48* 22  CREATININE 5.13* 6.35* 6.79* 7.25* 4.48*  CALCIUM 9.0 8.5* 8.5* 8.3* 8.3*  MG  --  1.7  --  1.8  --   PHOS  --   --   --  3.1  --    Liver Function Tests: Recent Labs  Lab 03/07/21 2017 03/08/21 1203 03/08/21 1834  AST 40 58* 57*  ALT 25 27 31   ALKPHOS 113 96 117  BILITOT 0.6 0.7 0.8  PROT 6.1* 6.0* 6.0*  ALBUMIN 2.9* 2.9* 2.8*   No results for input(s): LIPASE, AMYLASE in the last 168 hours. No results for input(s): AMMONIA in the last 168 hours. Cardiac Enzymes: No results for input(s): CKTOTAL, CKMB, CKMBINDEX, TROPONINI in the last 168 hours. BNP (last 3 results) No results for input(s): BNP in the last 8760 hours.  ProBNP (last 3 results) No results for input(s): PROBNP in the last 8760 hours.  CBG: Recent Labs  Lab 03/10/21 1101 03/10/21 1457 03/10/21 2218 03/11/21 0730 03/11/21 1155  GLUCAP 113* 232* 214* 250* 188*   Recent Results (from the past 240 hour(s))  Blood Culture (routine x 2)     Status: None (Preliminary result)   Collection Time: 03/07/21  8:22 PM   Specimen: BLOOD  Result Value Ref Range Status   Specimen Description BLOOD SITE NOT SPECIFIED  Final   Special Requests   Final    BOTTLES DRAWN AEROBIC AND ANAEROBIC Blood Culture adequate volume   Culture   Final    NO GROWTH 2 DAYS Performed at [facility], [place] [address] [place], [place] [address]    Report Status PENDING  Incomplete  Blood Culture  (routine x 2)     Status: None (Preliminary result)   Collection Time: 03/07/21  8:46 PM   Specimen: BLOOD LEFT HAND  Result Value Ref Range Status   Specimen Description BLOOD LEFT HAND  Final   Special Requests   Final    BOTTLES DRAWN AEROBIC AND ANAEROBIC Blood Culture adequate volume   Culture   Final    NO GROWTH 2 DAYS Performed at [facility], [place] [address], [place], [place] [address]    Report Status PENDING  Incomplete  Resp Panel by RT-PCR (Flu A&B, Covid) Nasopharyngeal Swab     Status: None   Collection Time: 03/08/21  2:35 AM   Specimen: Nasopharyngeal Swab; Nasopharyngeal(NP) swabs in vial transport medium  Result Value Ref Range Status   SARS Coronavirus 2 by RT PCR NEGATIVE NEGATIVE Final    Comment: (NOTE) SARS-CoV-2 target nucleic acids are NOT DETECTED.  The SARS-CoV-2 RNA is generally detectable in upper respiratory specimens during the acute phase of infection. The lowest concentration of SARS-CoV-2 viral copies this assay can detect is 138 copies/mL. A negative result does not preclude SARS-Cov-2 infection and should not be used as the sole basis for treatment or other patient management decisions. A negative result may occur with  improper specimen collection/handling, submission of specimen other than nasopharyngeal swab, presence of viral mutation(s) within the areas targeted by this assay, and inadequate number of viral copies(<138 copies/mL). A negative result must be combined with clinical observations, patient history, and epidemiological information. The expected result is Negative.  Fact Sheet for Patients:  EntrepreneurPulse.com.au  Fact Sheet for Healthcare Providers:  IncredibleEmployment.be  This test is no t yet approved or cleared by the [place] FDA and  has been authorized for detection and/or diagnosis of SARS-CoV-2 by FDA under an Emergency Use Authorization (EUA). This EUA will remain   in effect (meaning this test can be used) for the duration of the COVID-19 declaration under Section  564(b)(1) of the Act, 21 U.S.C.section 360bbb-3(b)(1), unless the authorization is terminated  or revoked sooner.       Influenza A by PCR NEGATIVE NEGATIVE Final   Influenza B by PCR NEGATIVE NEGATIVE Final    Comment: (NOTE) The Xpert Xpress SARS-CoV-2/FLU/RSV plus assay is intended as an aid in the diagnosis of influenza from Nasopharyngeal swab specimens and should not be used as a sole basis for treatment. Nasal washings and aspirates are unacceptable for Xpert Xpress SARS-CoV-2/FLU/RSV testing.  Fact Sheet for Patients: EntrepreneurPulse.com.au  Fact Sheet for Healthcare Providers: IncredibleEmployment.be  This test is not yet approved or cleared by the [place] FDA and has been authorized for detection and/or diagnosis of SARS-CoV-2 by FDA under an Emergency Use Authorization (EUA). This EUA will remain in effect (meaning this test can be used) for the duration of the COVID-19 declaration under Section 564(b)(1) of the Act, 21 U.S.C. section 360bbb-3(b)(1), unless the authorization is terminated or revoked.  Performed at [facility], [place] [address], [place], [place] [address]   MRSA PCR Screening     Status: None   Collection Time: 03/08/21  6:40 PM   Specimen: Nasal Mucosa; Nasopharyngeal  Result Value Ref Range Status   MRSA by PCR NEGATIVE NEGATIVE Final    Comment:        The GeneXpert MRSA Assay (FDA approved for NASAL specimens only), is one component of a comprehensive MRSA colonization surveillance program. It is not intended to diagnose MRSA infection nor to guide or monitor treatment for MRSA infections. Performed at [facility], [place] [address], [place], [place] [address]   Gastrointestinal Panel by PCR , Stool     Status: None   Collection Time: 03/09/21  3:52 AM   Specimen: STOOL  Result  Value Ref Range Status   Campylobacter species NOT DETECTED NOT DETECTED Final   Plesimonas shigelloides NOT DETECTED NOT DETECTED Final   Salmonella species NOT DETECTED NOT DETECTED Final   Yersinia enterocolitica NOT DETECTED NOT DETECTED Final   Vibrio species NOT DETECTED NOT DETECTED Final   Vibrio cholerae NOT DETECTED NOT DETECTED Final   Enteroaggregative E coli (EAEC) NOT DETECTED NOT DETECTED Final   Enteropathogenic E coli (EPEC) NOT DETECTED NOT DETECTED Final   Enterotoxigenic E coli (ETEC) NOT DETECTED NOT DETECTED Final   Shiga like toxin producing E coli (STEC) NOT DETECTED NOT DETECTED Final   Shigella/Enteroinvasive E coli (EIEC) NOT DETECTED NOT DETECTED Final   Cryptosporidium NOT DETECTED NOT DETECTED Final   Cyclospora cayetanensis NOT DETECTED NOT DETECTED Final   Entamoeba histolytica NOT DETECTED NOT DETECTED Final   Giardia lamblia NOT DETECTED NOT DETECTED Final   Adenovirus F40/41 NOT DETECTED NOT DETECTED Final   Astrovirus NOT DETECTED NOT DETECTED Final   Norovirus GI/GII NOT DETECTED NOT DETECTED Final   Rotavirus A NOT DETECTED NOT DETECTED Final   Sapovirus (I, II, IV, and V) NOT DETECTED NOT DETECTED Final    Comment: Performed at [facility], [address] [place], [place] [address]  C Difficile Quick Screen w PCR reflex     Status: None   Collection Time: 03/09/21  3:52 AM   Specimen: STOOL  Result Value Ref Range Status   C Diff antigen NEGATIVE NEGATIVE Final   C Diff toxin NEGATIVE NEGATIVE Final   C Diff interpretation No C. difficile detected.  Final    Comment: Performed at [facility], [place] [address], [place], [place] [address]  Studies: No results found.    Flora Lipps, MD  [facility] 03/11/2021  If 7PM-7AM, please contact night-coverage

## 2021-03-11 NOTE — Evaluation (Signed)
Physical Therapy Evaluation Patient Details Name: Duane Hall MRN: [id number] DOB: 1956/08/30 Today's Date: 03/11/2021   History of Present Illness  Pt is a 65 y.o. M admitted with severe sepsis secondary to right foot gangrene. MRI showing subacute mildly impacted fractures of second and third metatarsal necks. Planned right  BKA. Significant PMH: ESRD on hemodialysis, PVD, DM2, recent right first toe amputation 1/14.  Clinical Impression  Pt presents with decreased functional mobility secondary to generalized weakness, pain, decreased endurance, and hypotension. Pt requiring min assist to stand. Further mobility deferred due to low BP (listed below), but pt asymptomatic. Prior to admission, pt lives with his wife, has been using a wheelchair since surgery 1/14 and is modI with transfers and ADL's. Will need re-eval post planned R BKA.   Supine: 76/44 (55) Sitting: 60/50 (55) Standing: 52/44 (49)    Follow Up Recommendations CIR    Equipment Recommendations  3in1 (PT)    Recommendations for Other Services       Precautions / Restrictions Precautions Precautions: Fall Restrictions Weight Bearing Restrictions: Yes RLE Weight Bearing: Non weight bearing      Mobility  Bed Mobility Overal bed mobility: Needs Assistance Bed Mobility: Supine to Sit;Sit to Supine     Supine to sit: Supervision Sit to supine: Supervision   General bed mobility comments: Supervision for safety    Transfers Overall transfer level: Needs assistance Equipment used: Rolling walker (2 wheeled) Transfers: Sit to/from Stand Sit to Stand: Min assist         General transfer comment: MinA to rise to stand from edge of bed, cues for hand placement  Ambulation/Gait                Stairs            Wheelchair Mobility    Modified Rankin (Stroke Patients Only)       Balance Overall balance assessment: Needs assistance Sitting-balance support: Feet supported Sitting  balance-Leahy Scale: Good     Standing balance support: Bilateral upper extremity supported Standing balance-Leahy Scale: Poor Standing balance comment: reliant on hand support                             Pertinent Vitals/Pain Pain Assessment: Faces Faces Pain Scale: Hurts little more Pain Location: low back Pain Descriptors / Indicators: Discomfort Pain Intervention(s): Monitored during session    Home Living Family/patient expects to be discharged to:: Private residence Living Arrangements: Spouse/significant other Available Help at Discharge: Family Type of Home: House Home Access: Stairs to enter Entrance Stairs-Rails: Psychiatric nurse of Steps: 7 (7-8) Home Layout: One level Home Equipment: Shower seat;Wheelchair - [employer] - 2 wheels      Prior Function Level of Independence: Needs assistance   Gait / Transfers Assistance Needed: uses w/c since 1/14 amputation, modI for transfer  ADL's / Homemaking Assistance Needed: independent        Hand Dominance   Dominant Hand: Right    Extremity/Trunk Assessment   Upper Extremity Assessment Upper Extremity Assessment: Defer to OT evaluation    Lower Extremity Assessment Lower Extremity Assessment: RLE deficits/detail;LLE deficits/detail RLE Deficits / Details: First toe amputation. Hip/knee 2/5 strength LLE Deficits / Details: Hip/knee 3/5 strength       Communication   Communication: No difficulties  Cognition Arousal/Alertness: Awake/alert Behavior During Therapy: Flat affect Overall Cognitive Status: Within Functional Limits for tasks assessed  General Comments      Exercises General Exercises - Lower Extremity Long Arc Quad: Both;10 reps;Seated   Assessment/Plan    PT Assessment Patient needs continued PT services  PT Problem List Decreased strength;Decreased activity tolerance;Decreased balance;Decreased  mobility;Pain       PT Treatment Interventions DME instruction;Gait training;Functional mobility training;Therapeutic activities;Therapeutic exercise;Balance training;Stair training;Patient/family education    PT Goals (Current goals can be found in the Care Plan section)  Acute Rehab PT Goals Patient Stated Goal: to be as independent as possible and not to rely on wife PT Goal Formulation: With patient Time For Goal Achievement: 03/25/21 Potential to Achieve Goals: Good    Frequency Min 3X/week   Barriers to discharge        Co-evaluation               AM-PAC PT "6 Clicks" Mobility  Outcome Measure Help needed turning from your back to your side while in a flat bed without using bedrails?: None Help needed moving from lying on your back to sitting on the side of a flat bed without using bedrails?: None Help needed moving to and from a bed to a chair (including a wheelchair)?: A Little Help needed standing up from a chair using your arms (e.g., wheelchair or bedside chair)?: A Little Help needed to walk in hospital room?: A Lot Help needed climbing 3-5 steps with a railing? : Total 6 Click Score: 17    End of Session Equipment Utilized During Treatment: Gait belt Activity Tolerance: Treatment limited secondary to medical complications (Comment) (hypotensive) Patient left: in bed;with call bell/phone within reach Nurse Communication: Mobility status PT Visit Diagnosis: Unsteadiness on feet (R26.81);Muscle weakness (generalized) (M62.81);Difficulty in walking, not elsewhere classified (R26.2)    Time: 2130-8657 PT Time Calculation (min) (ACUTE ONLY): 44 min   Charges:   PT Evaluation $PT Eval Moderate Complexity: 1 Mod PT Treatments $Therapeutic Activity: 23-37 mins        Wyona Almas, PT, DPT Acute Rehabilitation Services Pager [phone] Office [phone]   Deno Etienne 03/11/2021, 2:30 PM

## 2021-03-11 NOTE — Progress Notes (Signed)
[facility] Progress Note  Subjective: seen in SDU bed on 4NP. Looks better in general, resting.   Vitals:   03/11/21 0334 03/11/21 0632 03/11/21 0700 03/11/21 1150  BP:  116/66 103/69 (!) 70/47  Pulse:  86  84  Resp:  16  20  Temp: 98.4 F (36.9 C) 97.8 F (36.6 C) 98 F (36.7 C) 98.4 F (36.9 C)  TempSrc: Oral Oral Oral   SpO2:  96% 95%   Weight:      Height:        Exam:   alert, nad , chronic ill appearing  no jvd  Chest cta bilat  Cor reg no RG  Abd soft ntnd no ascites   Ext no LE edema, R foot wrapped   Alert, NF, ox3   L AVF +bruit    OP HD: [facility]. 4 HD per week, MTuTh Fri. 76kg edw Hep 3000   Assessment/ Plan: 1. Sepsis D/T Diabetic foot wound- MRI +for osteo R foot. IV abx w/ improving BP's and MS. On Vanc/ meropenem. VVS discussing possible BKA this week w/ the pt and family.  2.  Hypokalemia- K+ better at 3.8 today. Supplement w/ KCl prn given that higher K+ bath fluids (3K, 4K) are not immediately available.  3.  ESRD -  Home hemo patient. Had HD here 3/11 yest. Next HD Monday. Plan 3 HD per week while here , 3.5 - 4h as needed.  4.  Hypotension/ volume: no vol excess, under dry by 1kg. Keep even next HD 5.  Anemia  - HGB 9.6 on admit, 10.1 today. Last ESA Mircera 02/13/21 50 mcg IV. No ESA for now. Follow trends.  6.  Metabolic bone disease - On Auryxia binders. No VDRA on medication list from [place].  7.  Nutrition - Albumin low. Changed to renal/carb mod diet with fluid restrictions. Add prosource and renal vits. 8. DMT2-per primary 9. H/O Hep C 10. H/O COPD, tobacco use.  11. H/O PAD on clopidogrel.     Rob Adewale Pucillo 03/11/2021, 3:00 PM   Recent Labs  Lab 03/08/21 1834 03/09/21 0533 03/10/21 0100  K 3.2* 3.2* 3.8  BUN 41* 48* 22  CREATININE 6.79* 7.25* 4.48*  CALCIUM 8.5* 8.3* 8.3*  PHOS  --  3.1  --   HGB 9.4* 9.1*  --    Inpatient medications: . aspirin  81 mg Oral Daily  . Chlorhexidine Gluconate  Cloth  6 each Topical Daily  . feeding supplement  1 Container Oral TID BM  . gabapentin  100 mg Oral Daily  . heparin  5,000 Units Subcutaneous Q8H  . insulin aspart  0-5 Units Subcutaneous QHS  . insulin aspart  0-6 Units Subcutaneous TID WC  . insulin aspart  3 Units Subcutaneous TID WC  . insulin glargine  10 Units Subcutaneous Daily  . multivitamin  1 tablet Oral QHS  . pantoprazole  40 mg Oral Daily  . rosuvastatin  10 mg Oral q morning  . sodium hypochlorite   Irrigation Daily  . vancomycin variable dose per unstable renal function (pharmacist dosing)   Does not apply See admin instructions   . meropenem (MERREM) IV 500 mg (03/11/21 1004)   acetaminophen **OR** acetaminophen, ondansetron **OR** ondansetron (ZOFRAN) IV, prochlorperazine

## 2021-03-11 NOTE — Progress Notes (Signed)
Pt admitted to the unit as a transfer from 66m. Pt oriented to the unit and room, telemetry applied and verified with CCMD, NT called to second verified. Pt skin dry and intact with no pressure ulcer of opened wounds except for the root wound. Pt in bed with call light within reach and bed alarm on.    03/11/21 0632  Vitals  Temp 97.8 F (36.6 C)  Temp Source Oral  BP 116/66  MAP (mmHg) 81  BP Location Left Arm  BP Method Automatic  Patient Position (if appropriate) Lying  Pulse Rate 86  Pulse Rate Source Monitor  ECG Heart Rate 85  Resp 16  MEWS COLOR  MEWS Score Color Green  Oxygen Therapy  SpO2 96 %  O2 Device Room Air  MEWS Score  MEWS Temp 0  MEWS Systolic 0  MEWS Pulse 0  MEWS RR 0  MEWS LOC 0  MEWS Score 0

## 2021-03-11 NOTE — Progress Notes (Signed)
Inpatient Rehab Admissions Coordinator Note:   Per PT recommendation, pt was screened for CIR candidacy by Gayland Curry, MS, CCC-SLP.  At this time we are not recommending an inpatient rehab consult. Will re-screen after therapies re-eval s/p R BKA.  Will follow from a distance.  Please contact me with questions.   Gayland Curry, [place], [place] Admissions Coordinator [phone] 03/11/21 4:07 PM

## 2021-03-12 LAB — CBC
HCT: 26.7 % — ABNORMAL LOW (ref 39.0–52.0)
Hemoglobin: 8.8 g/dL — ABNORMAL LOW (ref 13.0–17.0)
MCH: 31.1 pg (ref 26.0–34.0)
MCHC: 33 g/dL (ref 30.0–36.0)
MCV: 94.3 fL (ref 80.0–100.0)
Platelets: 167 10*3/uL (ref 150–400)
RBC: 2.83 MIL/uL — ABNORMAL LOW (ref 4.22–5.81)
RDW: 13.1 % (ref 11.5–15.5)
WBC: 7.3 10*3/uL (ref 4.0–10.5)
nRBC: 0 % (ref 0.0–0.2)

## 2021-03-12 LAB — BASIC METABOLIC PANEL
Anion gap: 11 (ref 5–15)
BUN: 54 mg/dL — ABNORMAL HIGH (ref 8–23)
CO2: 24 mmol/L (ref 22–32)
Calcium: 8 mg/dL — ABNORMAL LOW (ref 8.9–10.3)
Chloride: 98 mmol/L (ref 98–111)
Creatinine, Ser: 7.46 mg/dL — ABNORMAL HIGH (ref 0.61–1.24)
GFR, Estimated: 8 mL/min — ABNORMAL LOW (ref >60)
Glucose, Bld: 164 mg/dL — ABNORMAL HIGH (ref 70–99)
Potassium: 3.8 mmol/L (ref 3.5–5.1)
Sodium: 133 mmol/L — ABNORMAL LOW (ref 135–145)

## 2021-03-12 LAB — MAGNESIUM: Magnesium: 1.7 mg/dL (ref 1.7–2.4)

## 2021-03-12 LAB — GLUCOSE, CAPILLARY
Glucose-Capillary: 152 mg/dL — ABNORMAL HIGH (ref 70–99)
Glucose-Capillary: 231 mg/dL — ABNORMAL HIGH (ref 70–99)
Glucose-Capillary: 107 mg/dL — ABNORMAL HIGH (ref 70–99)
Glucose-Capillary: 124 mg/dL — ABNORMAL HIGH (ref 70–99)

## 2021-03-12 MED ORDER — VANCOMYCIN HCL IN DEXTROSE 750-5 MG/150ML-% IV SOLN
750.0000 mg | INTRAVENOUS | Status: AC
  Administered 2021-03-13: 750 mg via INTRAVENOUS
  Filled 2021-03-12: qty 150

## 2021-03-12 MED ORDER — RISAQUAD PO CAPS
2.0000 | ORAL_CAPSULE | Freq: Every day | ORAL | Status: DC
  Administered 2021-03-12 – 2021-03-16 (×4): 2 via ORAL
  Filled 2021-03-12 (×3): qty 2

## 2021-03-12 MED ORDER — DARBEPOETIN ALFA 60 MCG/0.3ML IJ SOSY
60.0000 ug | PREFILLED_SYRINGE | INTRAMUSCULAR | Status: DC
  Administered 2021-03-13: 60 ug via INTRAVENOUS

## 2021-03-12 NOTE — Plan of Care (Signed)
  Problem: Clinical Measurements: Goal: Ability to maintain clinical measurements within normal limits will improve Outcome: Progressing Goal: Will remain free from infection Outcome: Progressing Goal: Diagnostic test results will improve Outcome: Progressing Goal: Respiratory complications will improve Outcome: Progressing Goal: Cardiovascular complication will be avoided Outcome: Progressing   Problem: Activity: Goal: Risk for activity intolerance will decrease Outcome: Progressing   Problem: Nutrition: Goal: Adequate nutrition will be maintained Outcome: Progressing   Problem: Coping: Goal: Level of anxiety will decrease Outcome: Progressing   Problem: Elimination: Goal: Will not experience complications related to bowel motility Outcome: Progressing Goal: Will not experience complications related to urinary retention Outcome: Progressing   Problem: Pain Managment: Goal: General experience of comfort will improve Outcome: Progressing   Problem: Safety: Goal: Ability to remain free from injury will improve Outcome: Progressing   Problem: Skin Integrity: Goal: Risk for impaired skin integrity will decrease Outcome: Progressing   Problem: Fluid Volume: Goal: Hemodynamic stability will improve Outcome: Progressing   Problem: Clinical Measurements: Goal: Diagnostic test results will improve Outcome: Progressing Goal: Signs and symptoms of infection will decrease Outcome: Progressing   Problem: Respiratory: Goal: Ability to maintain adequate ventilation will improve Outcome: Progressing   Problem: Nutritional: Goal: Ability to make healthy dietary choices will improve Outcome: Progressing   Problem: Clinical Measurements: Goal: Complications related to the disease process, condition or treatment will be avoided or minimized Outcome: Progressing

## 2021-03-12 NOTE — Progress Notes (Signed)
[facility] ROUNDING NOTE   Subjective:   Brief history: Is a 65-year-old gentleman with history end-stage renal disease, type 2 diabetes peripheral vascular disease status post amputation 01/12/2021.  Presents with septic syndrome and redness over amputation site.  Plan is for below-knee amputation per vascular surgery  Last dialysis 03/09/2021 with 1 L removed next dialysis planned for 03/12/2021  Blood pressure 98/53 pulse 80 temperature 98.5 O2 sats 100% room air  Sodium 133 potassium 3.8 chloride 98 CO2 24 BUN 54 creatinine 7.46 glucose 164 calcium 8.0 hemoglobin 8.8    Objective:  Vital signs in last 24 hours:  Temp:  [98.3 F (36.8 C)-98.8 F (37.1 C)] 98.5 F (36.9 C) (03/14 0400) Pulse Rate:  [80-93] 80 (03/13 2100) Resp:  [10-20] 14 (03/14 0400) BP: (70-122)/(47-67) 95/58 (03/14 0400) SpO2:  [96 %-100 %] 100 % (03/14 0400)  Weight change:  Filed Weights   03/07/21 1943  Weight: 75 kg    Intake/Output: I/O last 3 completed shifts: In: 1326.1 [P.O.:1226; IV Piggyback:100.1] Out: -    Intake/Output this shift:  No intake/output data recorded.    alert, nad , chronic ill appearing  no jvd  Chest cta bilat  Cor reg no RG  Abd soft ntnd no ascites   Ext no LE edema, R foot wrapped   Alert, NF, ox3   L AVF +bruit   Basic Metabolic Panel: Recent Labs  Lab 03/08/21 1203 03/08/21 1834 03/09/21 0533 03/10/21 0100 03/12/21 0151  NA 133* 130* 133* 134* 133*  K 3.0* 3.2* 3.2* 3.8 3.8  CL 93* 90* 94* 99 98  CO2 27 24 24 26 24   GLUCOSE 152* 368* 256* 198* 164*  BUN 36* 41* 48* 22 54*  CREATININE 6.35* 6.79* 7.25* 4.48* 7.46*  CALCIUM 8.5* 8.5* 8.3* 8.3* 8.0*  MG 1.7  --  1.8  --  1.7  PHOS  --   --  3.1  --   --     Liver Function Tests: Recent Labs  Lab 03/07/21 2017 03/08/21 1203 03/08/21 1834  AST 40 58* 57*  ALT 25 27 31   ALKPHOS 113 96 117  BILITOT 0.6 0.7 0.8  PROT 6.1* 6.0* 6.0*  ALBUMIN 2.9* 2.9* 2.8*   No results for  input(s): LIPASE, AMYLASE in the last 168 hours. No results for input(s): AMMONIA in the last 168 hours.  CBC: Recent Labs  Lab 03/07/21 2017 03/08/21 1203 03/08/21 1834 03/09/21 0533 03/12/21 0151  WBC 10.7* 11.8* 9.8 7.8 7.3  NEUTROABS 9.2* 10.1*  --   --   --   HGB 9.6* 10.1* 9.4* 9.1* 8.8*  HCT 27.7* 29.4* 28.4* 26.2* 26.7*  MCV 90.5 92.7 92.8 91.3 94.3  PLT 154 175 171 152 167    Cardiac Enzymes: No results for input(s): CKTOTAL, CKMB, CKMBINDEX, TROPONINI in the last 168 hours.  BNP: Invalid input(s): POCBNP  CBG: Recent Labs  Lab 03/10/21 2218 03/11/21 0730 03/11/21 1155 03/11/21 1827 03/11/21 2129  GLUCAP 214* 250* 188* 330* 119*    Microbiology: Results for orders placed or performed during the hospital encounter of 03/07/21  Blood Culture (routine x 2)     Status: None (Preliminary result)   Collection Time: 03/07/21  8:22 PM   Specimen: BLOOD  Result Value Ref Range Status   Specimen Description BLOOD SITE NOT SPECIFIED  Final   Special Requests   Final    BOTTLES DRAWN AEROBIC AND ANAEROBIC Blood Culture adequate volume   Culture   Final  NO GROWTH 3 DAYS Performed at [facility], [place] [address], [place], [place] [address]    Report Status PENDING  Incomplete  Blood Culture (routine x 2)     Status: None (Preliminary result)   Collection Time: 03/07/21  8:46 PM   Specimen: BLOOD LEFT HAND  Result Value Ref Range Status   Specimen Description BLOOD LEFT HAND  Final   Special Requests   Final    BOTTLES DRAWN AEROBIC AND ANAEROBIC Blood Culture adequate volume   Culture   Final    NO GROWTH 3 DAYS Performed at [facility], [place] [address] [place], [place] [address]    Report Status PENDING  Incomplete  Resp Panel by RT-PCR (Flu A&B, Covid) Nasopharyngeal Swab     Status: None   Collection Time: 03/08/21  2:35 AM   Specimen: Nasopharyngeal Swab; Nasopharyngeal(NP) swabs in vial transport medium  Result Value Ref Range Status    SARS Coronavirus 2 by RT PCR NEGATIVE NEGATIVE Final    Comment: (NOTE) SARS-CoV-2 target nucleic acids are NOT DETECTED.  The SARS-CoV-2 RNA is generally detectable in upper respiratory specimens during the acute phase of infection. The lowest concentration of SARS-CoV-2 viral copies this assay can detect is 138 copies/mL. A negative result does not preclude SARS-Cov-2 infection and should not be used as the sole basis for treatment or other patient management decisions. A negative result may occur with  improper specimen collection/handling, submission of specimen other than nasopharyngeal swab, presence of viral mutation(s) within the areas targeted by this assay, and inadequate number of viral copies(<138 copies/mL). A negative result must be combined with clinical observations, patient history, and epidemiological information. The expected result is Negative.  Fact Sheet for Patients:  EntrepreneurPulse.com.au  Fact Sheet for Healthcare Providers:  IncredibleEmployment.be  This test is no t yet approved or cleared by the [place] FDA and  has been authorized for detection and/or diagnosis of SARS-CoV-2 by FDA under an Emergency Use Authorization (EUA). This EUA will remain  in effect (meaning this test can be used) for the duration of the COVID-19 declaration under Section 564(b)(1) of the Act, 21 U.S.C.section 360bbb-3(b)(1), unless the authorization is terminated  or revoked sooner.       Influenza A by PCR NEGATIVE NEGATIVE Final   Influenza B by PCR NEGATIVE NEGATIVE Final    Comment: (NOTE) The Xpert Xpress SARS-CoV-2/FLU/RSV plus assay is intended as an aid in the diagnosis of influenza from Nasopharyngeal swab specimens and should not be used as a sole basis for treatment. Nasal washings and aspirates are unacceptable for Xpert Xpress SARS-CoV-2/FLU/RSV testing.  Fact Sheet for  Patients: EntrepreneurPulse.com.au  Fact Sheet for Healthcare Providers: IncredibleEmployment.be  This test is not yet approved or cleared by the [place] FDA and has been authorized for detection and/or diagnosis of SARS-CoV-2 by FDA under an Emergency Use Authorization (EUA). This EUA will remain in effect (meaning this test can be used) for the duration of the COVID-19 declaration under Section 564(b)(1) of the Act, 21 U.S.C. section 360bbb-3(b)(1), unless the authorization is terminated or revoked.  Performed at [facility], [place] [address], [place],  [address]   MRSA PCR Screening     Status: None   Collection Time: 03/08/21  6:40 PM   Specimen: Nasal Mucosa; Nasopharyngeal  Result Value Ref Range Status   MRSA by PCR NEGATIVE NEGATIVE Final    Comment:        The GeneXpert MRSA  Assay (FDA approved for NASAL specimens only), is one component of a comprehensive MRSA colonization surveillance program. It is not intended to diagnose MRSA infection nor to guide or monitor treatment for MRSA infections. Performed at [facility], [place] [address] [place], [place] [address]   Gastrointestinal Panel by PCR , Stool     Status: None   Collection Time: 03/09/21  3:52 AM   Specimen: STOOL  Result Value Ref Range Status   Campylobacter species NOT DETECTED NOT DETECTED Final   Plesimonas shigelloides NOT DETECTED NOT DETECTED Final   Salmonella species NOT DETECTED NOT DETECTED Final   Yersinia enterocolitica NOT DETECTED NOT DETECTED Final   Vibrio species NOT DETECTED NOT DETECTED Final   Vibrio cholerae NOT DETECTED NOT DETECTED Final   Enteroaggregative E coli (EAEC) NOT DETECTED NOT DETECTED Final   Enteropathogenic E coli (EPEC) NOT DETECTED NOT DETECTED Final   Enterotoxigenic E coli (ETEC) NOT DETECTED NOT DETECTED Final   Shiga like toxin producing E coli (STEC) NOT DETECTED NOT DETECTED Final    Shigella/Enteroinvasive E coli (EIEC) NOT DETECTED NOT DETECTED Final   Cryptosporidium NOT DETECTED NOT DETECTED Final   Cyclospora cayetanensis NOT DETECTED NOT DETECTED Final   Entamoeba histolytica NOT DETECTED NOT DETECTED Final   Giardia lamblia NOT DETECTED NOT DETECTED Final   Adenovirus F40/41 NOT DETECTED NOT DETECTED Final   Astrovirus NOT DETECTED NOT DETECTED Final   Norovirus GI/GII NOT DETECTED NOT DETECTED Final   Rotavirus A NOT DETECTED NOT DETECTED Final   Sapovirus (I, II, IV, and V) NOT DETECTED NOT DETECTED Final    Comment: Performed at [facility], [address] [place], [place] [address]  C Difficile Quick Screen w PCR reflex     Status: None   Collection Time: 03/09/21  3:52 AM   Specimen: STOOL  Result Value Ref Range Status   C Diff antigen NEGATIVE NEGATIVE Final   C Diff toxin NEGATIVE NEGATIVE Final   C Diff interpretation No C. difficile detected.  Final    Comment: Performed at [facility], [place] [address] [place], [place] [address]    Coagulation Studies: No results for input(s): LABPROT, INR in the last 72 hours.  Urinalysis: No results for input(s): COLORURINE, LABSPEC, PHURINE, GLUCOSEU, HGBUR, BILIRUBINUR, KETONESUR, PROTEINUR, UROBILINOGEN, NITRITE, LEUKOCYTESUR in the last 72 hours.  Invalid input(s): APPERANCEUR    Imaging: No results found.   Medications:   . meropenem (MERREM) IV 500 mg (03/11/21 1004)   . aspirin  81 mg Oral Daily  . Chlorhexidine Gluconate Cloth  6 each Topical Daily  . feeding supplement  1 Container Oral TID BM  . gabapentin  100 mg Oral Daily  . heparin  5,000 Units Subcutaneous Q8H  . insulin aspart  0-5 Units Subcutaneous QHS  . insulin aspart  0-6 Units Subcutaneous TID WC  . insulin aspart  3 Units Subcutaneous TID WC  . insulin glargine  10 Units Subcutaneous Daily  . multivitamin  1 tablet Oral QHS  . pantoprazole  40 mg Oral Daily  . rosuvastatin  10 mg Oral q morning  .  sodium hypochlorite   Irrigation Daily  . vancomycin variable dose per unstable renal function (pharmacist dosing)   Does not apply See admin instructions   acetaminophen **OR** acetaminophen, loperamide, ondansetron **OR** ondansetron (ZOFRAN) IV, oxyCODONE-acetaminophen, prochlorperazine  Assessment/ Plan:  OP HD: [facility]. 4 HD per week, MTuTh Fri. 76kg edw Hep 3000   Assessment/  Plan: 1. Sepsis D/T Diabetic foot wound- MRI +for osteo R foot. IV abx w/ improving BP's and MS. On Vanc/ meropenem. VVS discussing possible BKA this week w/ the pt and family.  2. Hypokalemia- K+ better at 3.8 today. Supplement w/ KCl prn given that higher K+ bath fluids (3K, 4K) are not immediately available.  3. ESRD - Home hemo patient. Had HD here 3/11 yest. Next HD 03/12/2021. Plan 3 HD per week while here , 3.5 - 4h as needed.  4. Hypotension/ volume: no vol excess, under dry by 1kg. Keep even next HD 5. Anemia -   We will add ESA.  Darbepoetin 60 mcg weekly 6. Metabolic bone disease - On Auryxia binders. No VDRA on medication list from [facility]. 7. Nutrition - Albumin low. Changed to renal/carb mod diet with fluid restrictions. Add prosource and renal vits. 8. DMT2-per primary 9. H/O Hep C 10. H/O COPD, tobacco use.  11. H/O PAD onclopidogrel.    LOS: [place] @TODAY @7 :13 AM

## 2021-03-12 NOTE — Progress Notes (Signed)
Occupational Therapy Evaluation Patient Details Name: Duane Hall MRN: [id number] DOB: Apr 30, 1956 Today's Date: 03/12/2021    History of Present Illness Pt is a 65 y.o. M admitted with severe sepsis secondary to right foot gangrene. MRI showing subacute mildly impacted fractures of second and third metatarsal necks. Planned right  BKA. Significant PMH: ESRD on hemodialysis, PVD, DM2, recent right first toe amputation 1/14.   Clinical Impression   PTA pt modified independent with ADL and mobility and has primarily been using his wc since his toe amputation 1/14. Pt currently requires min A for ADL and mobility and is expected to undergo R BKA. Will reassess after surgery. At this time recommend rehab at [facility] to facilitate safe DC home.     Follow Up Recommendations  CIR    Equipment Recommendations  3 in 1 bedside commode    Recommendations for Other Services Rehab consult     Precautions / Restrictions Precautions Precautions: Fall Restrictions RLE Weight Bearing: Non weight bearing Other Position/Activity Restrictions: Pt does not maintain      Mobility Bed Mobility               General bed mobility comments: sitting EOB on entry    Transfers Overall transfer level: Needs assistance   Transfers: Sit to/from Stand Sit to Stand: Min assist              Balance Overall balance assessment: Needs assistance   Sitting balance-Leahy Scale: Good       Standing balance-Leahy Scale: Poor Standing balance comment: reliant on hand support                           ADL either performed or assessed with clinical judgement   ADL Overall ADL's : Needs assistance/impaired     Grooming: Set up;Sitting   Upper Body Bathing: Set up;Sitting   Lower Body Bathing: Minimal assistance;Sit to/from stand   Upper Body Dressing : Set up;Sitting   Lower Body Dressing: Minimal assistance   Toilet Transfer: Minimal assistance;Ambulation   Toileting-  Clothing Manipulation and Hygiene: Minimal assistance;Sit to/from stand       Functional mobility during ADLs: Minimal assistance;Rolling walker;Cueing for safety (does not maintain WBS)       Vision Baseline Vision/History: Wears glasses       Perception     Praxis      Pertinent Vitals/Pain Pain Assessment: No/denies pain     Hand Dominance Right   Extremity/Trunk Assessment Upper Extremity Assessment Upper Extremity Assessment: Generalized weakness (mild "tingling L hand")   Lower Extremity Assessment Lower Extremity Assessment: RLE deficits/detail RLE Deficits / Details: First toe amputation. Plan for R BKA RLE Sensation: decreased light touch   Cervical / Trunk Assessment Cervical / Trunk Assessment: Other exceptions (hx of back pain)   Communication     Cognition Arousal/Alertness: Awake/alert Behavior During Therapy: Flat affect Overall Cognitive Status: Within Functional Limits for tasks assessed                                 General Comments: most likely baseline; likes to know eactly what is going on/plan for therapy; decreased safety/judgement noted, however this is most likely baseline; not following WBS after educating pt on NWB   General Comments  asking to get back to bed immediately after getting to the chair. Encouraged  to sit up for breakfast  Exercises     Shoulder Instructions      Home Living Family/patient expects to be discharged to:: Private residence Living Arrangements: Spouse/significant other Available Help at Discharge: Family Type of Home: House Home Access: Stairs to enter Technical brewer of Steps: 7 Entrance Stairs-Rails: Right;Left Home Layout: One level     Bathroom Shower/Tub: Occupational psychologist: Standard Bathroom Accessibility: Yes How Accessible: Accessible via walker;Accessible via wheelchair Home Equipment: Shower seat;Wheelchair - [employer] - 2 wheels;Walker - 4 wheels  (pt states his walker "has a seat" - need clarification that he has a RW)          Prior Functioning/Environment Level of Independence: Needs assistance  Gait / Transfers Assistance Needed: uses w/c since 1/14 amputation, modI for transfer ADL's / Homemaking Assistance Needed: independent   Comments: was independent prior to toe amputation, including driving        OT Problem List: Decreased strength;Impaired balance (sitting and/or standing);Decreased safety awareness;Decreased knowledge of use of DME or AE      OT Treatment/Interventions: Self-care/ADL training;Therapeutic exercise;Neuromuscular education;Energy conservation;DME and/or AE instruction;Therapeutic activities;Patient/family education;Balance training    OT Goals(Current goals can be found in the care plan section) Acute Rehab OT Goals Patient Stated Goal: to get rehab before going home after his surgery OT Goal Formulation: With patient Time For Goal Achievement: 03/26/21 Potential to Achieve Goals: Good  OT Frequency: Min 2X/week   Barriers to D/C:            Co-evaluation              AM-PAC OT "6 Clicks" Daily Activity     Outcome Measure Help from another person eating meals?: None Help from another person taking care of personal grooming?: A Little Help from another person toileting, which includes using toliet, bedpan, or urinal?: A Little Help from another person bathing (including washing, rinsing, drying)?: A Little Help from another person to put on and taking off regular upper body clothing?: A Little Help from another person to put on and taking off regular lower body clothing?: A Little 6 Click Score: 19   End of Session Equipment Utilized During Treatment: Gait belt;Rolling walker Nurse Communication: Mobility status  Activity Tolerance: Patient tolerated treatment well Patient left: in chair;with call bell/phone within reach;with chair alarm set  OT Visit Diagnosis: Unsteadiness on  feet (R26.81);Other abnormalities of gait and mobility (R26.89);Muscle weakness (generalized) (M62.81)                Time: 2395-3202 OT Time Calculation (min): 23 min Charges:  OT General Charges $OT Visit: 1 Visit OT Evaluation $OT Eval Moderate Complexity: 1 Mod OT Treatments $Self Care/Home Management : 8-22 mins  Maurie Boettcher, OT/L   Acute OT Clinical Specialist Acute Rehabilitation Services Pager [phone] Office [phone]   [facility] 03/12/2021, 11:22 AM

## 2021-03-12 NOTE — Progress Notes (Addendum)
°  Progress Note    03/12/2021 7:45 AM * No surgery date entered *  Subjective:  No major complaints. Lots of questions regarding amputation   Vitals:   03/12/21 0700 03/12/21 0727  BP: (!) 98/57 98/61  Pulse:  85  Resp: 13 14  Temp:  98.2 F (36.8 C)  SpO2:  100%   Physical Exam: Cardiac:  regular Lungs:  Non labored Extremities:  Right foot dressings already changed this morning so I did not take them down. Clean, dry and intact Neurologic: alert and oriented  CBC    Component Value Date/Time   WBC 7.3 03/12/2021 0151   RBC 2.83 (L) 03/12/2021 0151   HGB 8.8 (L) 03/12/2021 0151   HGB 9.5 (L) 06/26/2020 1527   HCT 26.7 (L) 03/12/2021 0151   HCT 27.6 (L) 06/26/2020 1527   PLT 167 03/12/2021 0151   PLT 203 06/26/2020 1527   MCV 94.3 03/12/2021 0151   MCV 93 06/26/2020 1527   MCH 31.1 03/12/2021 0151   MCHC 33.0 03/12/2021 0151   RDW 13.1 03/12/2021 0151   RDW 11.9 06/26/2020 1527   LYMPHSABS 0.8 03/08/2021 1203   LYMPHSABS 1.0 06/26/2020 1527   MONOABS 0.8 03/08/2021 1203   EOSABS 0.0 03/08/2021 1203   EOSABS 0.2 06/26/2020 1527   BASOSABS 0.0 03/08/2021 1203   BASOSABS 0.0 06/26/2020 1527    BMET    Component Value Date/Time   NA 133 (L) 03/12/2021 0151   K 3.8 03/12/2021 0151   CL 98 03/12/2021 0151   CO2 24 03/12/2021 0151   GLUCOSE 164 (H) 03/12/2021 0151   BUN 54 (H) 03/12/2021 0151   CREATININE 7.46 (H) 03/12/2021 0151   CREATININE 2.22 (H) 09/07/2013 0845   CALCIUM 8.0 (L) 03/12/2021 0151   GFRNONAA 8 (L) 03/12/2021 0151   GFRAA 9 (L) 07/31/2020 2100    INR    Component Value Date/Time   INR 1.4 (H) 03/08/2021 1203     Intake/Output Summary (Last 24 hours) at 03/12/2021 0745 Last data filed at 03/12/2021 0400 Gross per 24 hour  Intake 756.06 ml  Output --  Net 756.06 ml     Assessment/Plan:  65 y.o. male is with non healing right partial foot amputation with osteomyelitis. Has had right lower extremity revascularization. He is  scheduled for right BKA tomorrow. NPO after midnight. Morning labs ordered and consent in chart  Duane Hall [facility] [phone] 03/12/2021 7:45 AM   I have independently interviewed and examined patient and agree with PA assessment and plan above. Plan for right bka tomorrow in OR. All questions answered.   Duane Hall C. Donzetta Matters, MD [facility] [place] Office: [phone] Pager: [phone]

## 2021-03-12 NOTE — H&P (View-Only) (Signed)
  Progress Note    03/12/2021 7:45 AM * No surgery date entered *  Subjective:  No major complaints. Lots of questions regarding amputation   Vitals:   03/12/21 0700 03/12/21 0727  BP: (!) 98/57 98/61  Pulse:  85  Resp: 13 14  Temp:  98.2 F (36.8 C)  SpO2:  100%   Physical Exam: Cardiac:  regular Lungs:  Non labored Extremities:  Right foot dressings already changed this morning so I did not take them down. Clean, dry and intact Neurologic: alert and oriented  CBC    Component Value Date/Time   WBC 7.3 03/12/2021 0151   RBC 2.83 (L) 03/12/2021 0151   HGB 8.8 (L) 03/12/2021 0151   HGB 9.5 (L) 06/26/2020 1527   HCT 26.7 (L) 03/12/2021 0151   HCT 27.6 (L) 06/26/2020 1527   PLT 167 03/12/2021 0151   PLT 203 06/26/2020 1527   MCV 94.3 03/12/2021 0151   MCV 93 06/26/2020 1527   MCH 31.1 03/12/2021 0151   MCHC 33.0 03/12/2021 0151   RDW 13.1 03/12/2021 0151   RDW 11.9 06/26/2020 1527   LYMPHSABS 0.8 03/08/2021 1203   LYMPHSABS 1.0 06/26/2020 1527   MONOABS 0.8 03/08/2021 1203   EOSABS 0.0 03/08/2021 1203   EOSABS 0.2 06/26/2020 1527   BASOSABS 0.0 03/08/2021 1203   BASOSABS 0.0 06/26/2020 1527    BMET    Component Value Date/Time   NA 133 (L) 03/12/2021 0151   K 3.8 03/12/2021 0151   CL 98 03/12/2021 0151   CO2 24 03/12/2021 0151   GLUCOSE 164 (H) 03/12/2021 0151   BUN 54 (H) 03/12/2021 0151   CREATININE 7.46 (H) 03/12/2021 0151   CREATININE 2.22 (H) 09/07/2013 0845   CALCIUM 8.0 (L) 03/12/2021 0151   GFRNONAA 8 (L) 03/12/2021 0151   GFRAA 9 (L) 07/31/2020 2100    INR    Component Value Date/Time   INR 1.4 (H) 03/08/2021 1203     Intake/Output Summary (Last 24 hours) at 03/12/2021 0745 Last data filed at 03/12/2021 0400 Gross per 24 hour  Intake 756.06 ml  Output --  Net 756.06 ml     Assessment/Plan:  65 y.o. male is with non healing right partial foot amputation with osteomyelitis. Has had right lower extremity revascularization. He is  scheduled for right BKA tomorrow. NPO after midnight. Morning labs ordered and consent in chart  Marval Regal [facility] [phone] 03/12/2021 7:45 AM   I have independently interviewed and examined patient and agree with PA assessment and plan above. Plan for right bka tomorrow in OR. All questions answered.   Wane Mollett C. Donzetta Matters, MD [facility] [place] Office: [phone] Pager: [phone]

## 2021-03-12 NOTE — Progress Notes (Signed)
PROGRESS NOTE  Duane Hall YIF:[id number] DOB: 1956/07/05 DOA: 03/07/2021 PCP: Pieter Partridge, PA   LOS: 5 days   Brief narrative:  Duane Hall is a 65 years old male with history of end-stage renal disease on hemodialysis, type 2 diabetes, peripheral vascular disease who had undergone amputation on 1/14 due to gangrene being followed by wound care as outpatient presented to the hospital with increasing redness and dryness at the amputation site.  Patient had chills dizziness sweatiness during hemodialysis session and was noted to be febrile and confused she was sent into the hospital.  In the ED, patient was noted to be hypotensive tachycardic with a fever of 103F.  He was then admitted to the ICU for further evaluation and treatment.  Patient was subsequently transferred out of the ICU.  Seen by vascular surgery who have recommendations for below-knee amputation.  Assessment/Plan:  Principal Problem:   Sepsis ([place]) Active Problems:   Anemia of chronic disease   Hypokalemia   Diabetic foot ulcer (HCC)   Benign prostatic hyperplasia with weak urinary stream   ESRD (end stage renal disease) ([place])   Uncontrolled type 2 diabetes mellitus with hyperglycemia (HCC)   Osteomyelitis of right foot (HCC)   Gangrene (HCC)  Severe sepsis secondary to right foot gangrene/osteomyelitis Right foot On vancomycin and meropenem.  Podiatry and vascular surgery on board.  Planning for below-knee amputation with vascular surgery on 03/14/2019.Marland Kitchen  Continue to hold the Plavix.  ESRD on hemodialysis at home. Hypotensive with 4 x weekly HD at home. Nephrology on board for hemodialysis.  Peripheral Vascular Disease Patient with recent balloon angioplasty and atherectomy 12/27 of right SFA and posterior tibial artery.  Vascular surgery on board.  Continue aspirin and statin.  Holding Plavix until amputation  Diarrhea The antibiotic associated.  Imodium as needed.  C. difficile negative.  States  that he has been having some diarrhea.  Add probiotic  Diabetes mellitus type II with hyperglycemia On insulin pump at home which has been disconnected.  Currently on Lantus 10 units and NovoLog sliding-scale insulin.  Continue diabetic diet, Accu-Cheks, closely monitor blood glucose levels.  Hypokalemia, resolved  Hyponatremia Mild.  Will monitor.  Anemia of Chronic Disease Taken off iron supplements due to ongoing infection.  Transfuse for hemoglobin less than 7.    Tobacco abuse Counseling done.  Chronic pain on low-dose gabapentin; Percocet  Deconditionoing Patient is currently with right foot infection waiting for surgical intervention.  Physical therapy and Occupational Therapy recommend CIR   DVT prophylaxis: heparin injection 5,000 Units Start: 03/08/21 1400  Code Status: Full code  Family Communication: None  Status is: Inpatient  Remains inpatient appropriate because:IV treatments appropriate due to intensity of illness or inability to take PO, Inpatient level of care appropriate due to severity of illness and Surgical intervention pending   Dispo: The patient is from: Home              Anticipated d/c is to: Rehabilitation CIR/SNF              Patient currently is not medically stable to d/c.   Difficult to place patient No  Consultants:  PCCM  Podiatry,   vascular surgery  Procedures:  None  Anti-infectives:  Marland Kitchen Vancomycin 3/10> . Meropenem 3/10>  Anti-infectives (From admission, onward)   Start     Dose/Rate Route Frequency Ordered Stop   03/12/21 1200  vancomycin (VANCOCIN) IVPB 750 mg/150 ml premix        750  mg 150 mL/hr over 60 Minutes Intravenous Every M-W-F (Hemodialysis) 03/12/21 1055 03/14/21 1159   03/09/21 1400  vancomycin (VANCOREADY) IVPB 750 mg/150 mL        750 mg 150 mL/hr over 60 Minutes Intravenous  Once 03/09/21 0927 03/09/21 1601   03/09/21 0800  meropenem (MERREM) 500 mg in sodium chloride 0.9 % 100 mL IVPB         500 mg 200 mL/hr over 30 Minutes Intravenous Every 24 hours 03/08/21 0733     03/08/21 1145  cefTRIAXone (ROCEPHIN) 2 g in sodium chloride 0.9 % 100 mL IVPB  Status:  Discontinued        2 g 200 mL/hr over 30 Minutes Intravenous Every 24 hours 03/08/21 1134 03/08/21 1137   03/08/21 0745  vancomycin (VANCOREADY) IVPB 500 mg/100 mL        500 mg 100 mL/hr over 60 Minutes Intravenous  Once 03/08/21 0733 03/08/21 1102   03/08/21 0400  meropenem (MERREM) 1 g in sodium chloride 0.9 % 100 mL IVPB  Status:  Discontinued        1 g 200 mL/hr over 30 Minutes Intravenous Every 12 hours 03/08/21 0349 03/08/21 0733   03/07/21 2208  vancomycin variable dose per unstable renal function (pharmacist dosing)         Does not apply See admin instructions 03/07/21 2208     03/07/21 2030  vancomycin (VANCOREADY) IVPB 1000 mg/200 mL        1,000 mg 200 mL/hr over 60 Minutes Intravenous  Once 03/07/21 2018 03/07/21 2230   03/07/21 2030  cefTRIAXone (ROCEPHIN) 2 g in sodium chloride 0.9 % 100 mL IVPB        2 g 200 mL/hr over 30 Minutes Intravenous  Once 03/07/21 2018 03/07/21 2110     Subjective: Today, patient was seen and examined at bedside.  Patient states that that has been having diarrhea no overt foot pain.  Has mild back pain.  Feels tired  Objective: Vitals:   03/12/21 0700 03/12/21 0727  BP: (!) 98/57 98/61  Pulse:  85  Resp: 13 14  Temp:  98.2 F (36.8 C)  SpO2:  100%    Intake/Output Summary (Last 24 hours) at 03/12/2021 1156 Last data filed at 03/12/2021 0400 Gross per 24 hour  Intake 636.06 ml  Output --  Net 636.06 ml   Filed Weights   03/07/21 1943  Weight: 75 kg   Body mass index is 24.42 kg/m.   Physical Exam: General: Alert awake and communicative, not in obvious distress, chronically HENT:   No scleral pallor or icterus noted. Oral mucosa is moist.  Chest:  Clear breath sounds.  Diminished breath sounds bilaterally. No crackles or wheezes.  CVS: S1 &S2 heard. No  murmur.  Regular rate and rhythm. Abdomen: Soft, nontender, nondistended.  Bowel sounds are heard.   Extremities: Right foot is bandaged, left upper extremity AV fistula. Psych: Alert, awake and oriented, normal mood CNS:  No cranial nerve deficits.  Power equal in all extremities.   Skin: Warm and dry.  Right foot bandaged  Data Review: I have personally reviewed the following laboratory data and studies,  CBC: Recent Labs  Lab 03/07/21 2017 03/08/21 1203 03/08/21 1834 03/09/21 0533 03/12/21 0151  WBC 10.7* 11.8* 9.8 7.8 7.3  NEUTROABS 9.2* 10.1*  --   --   --   HGB 9.6* 10.1* 9.4* 9.1* 8.8*  HCT 27.7* 29.4* 28.4* 26.2* 26.7*  MCV 90.5 92.7 92.8 91.3 94.3  PLT 154 175 171 152 916   Basic Metabolic Panel: Recent Labs  Lab 03/08/21 1203 03/08/21 1834 03/09/21 0533 03/10/21 0100 03/12/21 0151  NA 133* 130* 133* 134* 133*  K 3.0* 3.2* 3.2* 3.8 3.8  CL 93* 90* 94* 99 98  CO2 27 24 24 26 24   GLUCOSE 152* 368* 256* 198* 164*  BUN 36* 41* 48* 22 54*  CREATININE 6.35* 6.79* 7.25* 4.48* 7.46*  CALCIUM 8.5* 8.5* 8.3* 8.3* 8.0*  MG 1.7  --  1.8  --  1.7  PHOS  --   --  3.1  --   --    Liver Function Tests: Recent Labs  Lab 03/07/21 2017 03/08/21 1203 03/08/21 1834  AST 40 58* 57*  ALT 25 27 31   ALKPHOS 113 96 117  BILITOT 0.6 0.7 0.8  PROT 6.1* 6.0* 6.0*  ALBUMIN 2.9* 2.9* 2.8*   No results for input(s): LIPASE, AMYLASE in the last 168 hours. No results for input(s): AMMONIA in the last 168 hours. Cardiac Enzymes: No results for input(s): CKTOTAL, CKMB, CKMBINDEX, TROPONINI in the last 168 hours. BNP (last 3 results) No results for input(s): BNP in the last 8760 hours.  ProBNP (last 3 results) No results for input(s): PROBNP in the last 8760 hours.  CBG: Recent Labs  Lab 03/11/21 1155 03/11/21 1827 03/11/21 2129 03/12/21 0740 03/12/21 1144  GLUCAP 188* 330* 119* 152* 231*   Recent Results (from the past 240 hour(s))  Blood Culture (routine x 2)      Status: None (Preliminary result)   Collection Time: 03/07/21  8:22 PM   Specimen: BLOOD  Result Value Ref Range Status   Specimen Description BLOOD SITE NOT SPECIFIED  Final   Special Requests   Final    BOTTLES DRAWN AEROBIC AND ANAEROBIC Blood Culture adequate volume   Culture   Final    NO GROWTH 4 DAYS Performed at [facility], [place] [address] [place], [place] [address]    Report Status PENDING  Incomplete  Blood Culture (routine x 2)     Status: None (Preliminary result)   Collection Time: 03/07/21  8:46 PM   Specimen: BLOOD LEFT HAND  Result Value Ref Range Status   Specimen Description BLOOD LEFT HAND  Final   Special Requests   Final    BOTTLES DRAWN AEROBIC AND ANAEROBIC Blood Culture adequate volume   Culture   Final    NO GROWTH 4 DAYS Performed at [facility], [place] [address] [place], [place] [address]    Report Status PENDING  Incomplete  Resp Panel by RT-PCR (Flu A&B, Covid) Nasopharyngeal Swab     Status: None   Collection Time: 03/08/21  2:35 AM   Specimen: Nasopharyngeal Swab; Nasopharyngeal(NP) swabs in vial transport medium  Result Value Ref Range Status   SARS Coronavirus 2 by RT PCR NEGATIVE NEGATIVE Final    Comment: (NOTE) SARS-CoV-2 target nucleic acids are NOT DETECTED.  The SARS-CoV-2 RNA is generally detectable in upper respiratory specimens during the acute phase of infection. The lowest concentration of SARS-CoV-2 viral copies this assay can detect is 138 copies/mL. A negative result does not preclude SARS-Cov-2 infection and should not be used as the sole basis for treatment or other patient management decisions. A negative result may occur with  improper specimen collection/handling, submission of specimen other than nasopharyngeal swab, presence of viral mutation(s) within the areas targeted by this assay, and inadequate number of viral copies(<138 copies/mL). A negative result  must be combined with clinical observations,  patient history, and epidemiological information. The expected result is Negative.  Fact Sheet for Patients:  EntrepreneurPulse.com.au  Fact Sheet for Healthcare Providers:  IncredibleEmployment.be  This test is no t yet approved or cleared by the [place] FDA and  has been authorized for detection and/or diagnosis of SARS-CoV-2 by FDA under an Emergency Use Authorization (EUA). This EUA will remain  in effect (meaning this test can be used) for the duration of the COVID-19 declaration under Section 564(b)(1) of the Act, 21 U.S.C.section 360bbb-3(b)(1), unless the authorization is terminated  or revoked sooner.       Influenza A by PCR NEGATIVE NEGATIVE Final   Influenza B by PCR NEGATIVE NEGATIVE Final    Comment: (NOTE) The Xpert Xpress SARS-CoV-2/FLU/RSV plus assay is intended as an aid in the diagnosis of influenza from Nasopharyngeal swab specimens and should not be used as a sole basis for treatment. Nasal washings and aspirates are unacceptable for Xpert Xpress SARS-CoV-2/FLU/RSV testing.  Fact Sheet for Patients: EntrepreneurPulse.com.au  Fact Sheet for Healthcare Providers: IncredibleEmployment.be  This test is not yet approved or cleared by the [place] FDA and has been authorized for detection and/or diagnosis of SARS-CoV-2 by FDA under an Emergency Use Authorization (EUA). This EUA will remain in effect (meaning this test can be used) for the duration of the COVID-19 declaration under Section 564(b)(1) of the Act, 21 U.S.C. section 360bbb-3(b)(1), unless the authorization is terminated or revoked.  Performed at [facility], [place] [address] [place], [place] [address]   MRSA PCR Screening     Status: None   Collection Time: 03/08/21  6:40 PM   Specimen: Nasal Mucosa; Nasopharyngeal  Result Value Ref Range Status   MRSA by PCR NEGATIVE NEGATIVE Final    Comment:         The GeneXpert MRSA Assay (FDA approved for NASAL specimens only), is one component of a comprehensive MRSA colonization surveillance program. It is not intended to diagnose MRSA infection nor to guide or monitor treatment for MRSA infections. Performed at [facility], [place] [address], [place],  [address]   Gastrointestinal Panel by PCR , Stool     Status: None   Collection Time: 03/09/21  3:52 AM   Specimen: STOOL  Result Value Ref Range Status   Campylobacter species NOT DETECTED NOT DETECTED Final   Plesimonas shigelloides NOT DETECTED NOT DETECTED Final   Salmonella species NOT DETECTED NOT DETECTED Final   Yersinia enterocolitica NOT DETECTED NOT DETECTED Final   Vibrio species NOT DETECTED NOT DETECTED Final   Vibrio cholerae NOT DETECTED NOT DETECTED Final   Enteroaggregative E coli (EAEC) NOT DETECTED NOT DETECTED Final   Enteropathogenic E coli (EPEC) NOT DETECTED NOT DETECTED Final   Enterotoxigenic E coli (ETEC) NOT DETECTED NOT DETECTED Final   Shiga like toxin producing E coli (STEC) NOT DETECTED NOT DETECTED Final   Shigella/Enteroinvasive E coli (EIEC) NOT DETECTED NOT DETECTED Final   Cryptosporidium NOT DETECTED NOT DETECTED Final   Cyclospora cayetanensis NOT DETECTED NOT DETECTED Final   Entamoeba histolytica NOT DETECTED NOT DETECTED Final   Giardia lamblia NOT DETECTED NOT DETECTED Final   Adenovirus F40/41 NOT DETECTED NOT DETECTED Final   Astrovirus NOT DETECTED NOT DETECTED Final   Norovirus GI/GII NOT DETECTED NOT DETECTED Final   Rotavirus A NOT DETECTED NOT DETECTED Final   Sapovirus (I, II, IV, and V) NOT DETECTED NOT DETECTED Final    Comment:  Performed at [facility], [place], [place] [address]  C Difficile Quick Screen w PCR reflex     Status: None   Collection Time: 03/09/21  3:52 AM   Specimen: STOOL  Result Value Ref Range Status   C Diff antigen NEGATIVE NEGATIVE Final   C Diff toxin NEGATIVE NEGATIVE  Final   C Diff interpretation No C. difficile detected.  Final    Comment: Performed at [facility], [place] [address], [place], [place] [address]     Studies: No results found.    Flora Lipps, MD  [facility] 03/12/2021  If 7PM-7AM, please contact night-coverage

## 2021-03-12 NOTE — Progress Notes (Signed)
Pharmacy Antibiotic Note  Duane Hall is a 65 y.o. male admitted on 03/07/2021 with sepsis and non healing right partial foot amputation with osteomyelitis. Plan for BKA on Tuesday 3/15. Pharmacy has been consulted for Merrem and Vancomycin dosing.    S/p HD 3/11. Next HD planned for today, 3/14.  Cultures no growth to date. WBC is within normal limits. Patient is afebrile.   Plan: Continue Merrem 500 mg IV every 24 hours.  Vancomycin 750 mg IV post-HD 3/14.  Follow-up dialysis schedule and needs post BKA  Height: 5\' 9"  (175.3 cm) Weight: 75 kg (165 lb 5.5 oz) IBW/kg (Calculated) : 70.7  Temp (24hrs), Avg:98.4 F (36.9 C), Min:98.2 F (36.8 C), Max:98.8 F (37.1 C)  Recent Labs  Lab 03/07/21 2017 03/08/21 0345 03/08/21 1203 03/08/21 1834 03/09/21 0533 03/10/21 0100 03/12/21 0151  WBC 10.7*  --  11.8* 9.8 7.8  --  7.3  CREATININE 5.13*  --  6.35* 6.79* 7.25* 4.48* 7.46*  LATICACIDVEN 2.0* 1.6 2.3* 1.6 0.9  --   --     Estimated Creatinine Clearance: 10.1 mL/min (A) (by C-G formula based on SCr of 7.46 mg/dL (H)).    Allergies  Allergen Reactions  . Amoxicillin-Pot Clavulanate Diarrhea and Other (See Comments)  . Midodrine Other (See Comments)    Other reaction(s): Urinary Sensation  . Tape Other (See Comments)    bandaids cause blistering    Sloan Leiter, PharmD, BCPS, BCCCP Clinical Pharmacist Please refer to [facility] for [place] numbers 03/12/2021, 10:49 AM

## 2021-03-13 ENCOUNTER — Encounter (HOSPITAL_COMMUNITY)
Admission: EM | Disposition: A | Discharge status: Rehabilitation Facility | Payer: Self-pay | Source: Home / Self Care | Attending: Internal Medicine

## 2021-03-13 ENCOUNTER — Inpatient Hospital Stay (HOSPITAL_COMMUNITY): Payer: BC Managed Care – PPO | Admitting: Certified Registered Nurse Anesthetist

## 2021-03-13 ENCOUNTER — Other Ambulatory Visit: Payer: Self-pay | Admitting: Vascular Surgery

## 2021-03-13 ENCOUNTER — Encounter (HOSPITAL_COMMUNITY): Payer: Self-pay | Admitting: Family Medicine

## 2021-03-13 HISTORY — PX: AMPUTATION: SHX166

## 2021-03-13 LAB — CBC
HCT: 27.8 % — ABNORMAL LOW (ref 39.0–52.0)
Hemoglobin: 8.7 g/dL — ABNORMAL LOW (ref 13.0–17.0)
MCH: 30.4 pg (ref 26.0–34.0)
MCHC: 31.3 g/dL (ref 30.0–36.0)
MCV: 97.2 fL (ref 80.0–100.0)
Platelets: 209 10*3/uL (ref 150–400)
RBC: 2.86 MIL/uL — ABNORMAL LOW (ref 4.22–5.81)
RDW: 13.2 % (ref 11.5–15.5)
WBC: 7.9 10*3/uL (ref 4.0–10.5)
nRBC: 0 % (ref 0.0–0.2)

## 2021-03-13 LAB — BASIC METABOLIC PANEL
Anion gap: 13 (ref 5–15)
BUN: 66 mg/dL — ABNORMAL HIGH (ref 8–23)
CO2: 24 mmol/L (ref 22–32)
Calcium: 8.3 mg/dL — ABNORMAL LOW (ref 8.9–10.3)
Chloride: 98 mmol/L (ref 98–111)
Creatinine, Ser: 9.23 mg/dL — ABNORMAL HIGH (ref 0.61–1.24)
GFR, Estimated: 6 mL/min — ABNORMAL LOW (ref >60)
Glucose, Bld: 136 mg/dL — ABNORMAL HIGH (ref 70–99)
Potassium: 4 mmol/L (ref 3.5–5.1)
Sodium: 135 mmol/L (ref 135–145)

## 2021-03-13 LAB — CULTURE, BLOOD (ROUTINE X 2)
Culture: NO GROWTH
Culture: NO GROWTH
Special Requests: ADEQUATE
Special Requests: ADEQUATE

## 2021-03-13 LAB — GLUCOSE, CAPILLARY
Glucose-Capillary: 162 mg/dL — ABNORMAL HIGH (ref 70–99)
Glucose-Capillary: 184 mg/dL — ABNORMAL HIGH (ref 70–99)
Glucose-Capillary: 217 mg/dL — ABNORMAL HIGH (ref 70–99)
Glucose-Capillary: 217 mg/dL — ABNORMAL HIGH (ref 70–99)
Glucose-Capillary: 235 mg/dL — ABNORMAL HIGH (ref 70–99)

## 2021-03-13 LAB — PHOSPHORUS: Phosphorus: 2.8 mg/dL (ref 2.5–4.6)

## 2021-03-13 LAB — MAGNESIUM: Magnesium: 1.9 mg/dL (ref 1.7–2.4)

## 2021-03-13 SURGERY — AMPUTATION BELOW KNEE
Anesthesia: General | Site: Knee | Laterality: Right

## 2021-03-13 MED ORDER — PHENYLEPHRINE HCL (PRESSORS) 10 MG/ML IV SOLN
INTRAVENOUS | Status: DC | PRN
  Administered 2021-03-13 (×2): 120 ug via INTRAVENOUS

## 2021-03-13 MED ORDER — DARBEPOETIN ALFA 60 MCG/0.3ML IJ SOSY
PREFILLED_SYRINGE | INTRAMUSCULAR | Status: AC
  Filled 2021-03-13: qty 0.3

## 2021-03-13 MED ORDER — PHENYLEPHRINE HCL-NACL 10-0.9 MG/250ML-% IV SOLN
INTRAVENOUS | Status: DC | PRN
  Administered 2021-03-13: 40 ug/min via INTRAVENOUS

## 2021-03-13 MED ORDER — HEPARIN SODIUM (PORCINE) 1000 UNIT/ML IJ SOLN
INTRAMUSCULAR | Status: AC
  Administered 2021-03-13: 3000 [IU]
  Filled 2021-03-13: qty 8

## 2021-03-13 MED ORDER — ONDANSETRON HCL 4 MG/2ML IJ SOLN: 4.0000 mg | Freq: Four times a day (QID) | INTRAMUSCULAR | Status: DC | PRN

## 2021-03-13 MED ORDER — OXYCODONE-ACETAMINOPHEN 5-325 MG PO TABS: 1.0000 | ORAL_TABLET | ORAL | Status: DC | PRN

## 2021-03-13 MED ORDER — HYDROMORPHONE 1 MG/ML IV SOLN
INTRAVENOUS | Status: DC
  Administered 2021-03-13: 30 mg via INTRAVENOUS
  Filled 2021-03-13: qty 30

## 2021-03-13 MED ORDER — ONDANSETRON HCL 4 MG/2ML IJ SOLN
INTRAMUSCULAR | Status: DC | PRN
  Administered 2021-03-13: 4 mg via INTRAVENOUS

## 2021-03-13 MED ORDER — OXYCODONE-ACETAMINOPHEN 5-325 MG PO TABS
1.0000 | ORAL_TABLET | ORAL | Status: DC | PRN
Start: 2021-03-13 — End: 2021-03-13

## 2021-03-13 MED ORDER — ORAL CARE MOUTH RINSE: 15.0000 mL | Freq: Once | OROMUCOSAL | Status: DC

## 2021-03-13 MED ORDER — DARBEPOETIN ALFA 60 MCG/0.3ML IJ SOSY: 60.0000 ug | PREFILLED_SYRINGE | INTRAMUSCULAR | Status: DC

## 2021-03-13 MED ORDER — MIDAZOLAM HCL 5 MG/5ML IJ SOLN
INTRAMUSCULAR | Status: DC | PRN
  Administered 2021-03-13: 2 mg via INTRAVENOUS

## 2021-03-13 MED ORDER — LOPERAMIDE HCL 2 MG PO CAPS
ORAL_CAPSULE | ORAL | Status: AC
  Filled 2021-03-13: qty 1

## 2021-03-13 MED ORDER — HYDROMORPHONE HCL 1 MG/ML IJ SOLN: 1.0000 mg | INTRAMUSCULAR | Status: DC | PRN

## 2021-03-13 MED ORDER — FENTANYL CITRATE (PF) 100 MCG/2ML IJ SOLN
INTRAMUSCULAR | Status: AC
  Administered 2021-03-13: 50 ug via INTRAVENOUS
  Filled 2021-03-13: qty 2

## 2021-03-13 MED ORDER — FENTANYL CITRATE (PF) 100 MCG/2ML IJ SOLN: 25.0000 ug | INTRAMUSCULAR | Status: DC | PRN

## 2021-03-13 MED ORDER — SODIUM CHLORIDE 0.9% FLUSH: 9.0000 mL | INTRAVENOUS | Status: DC | PRN

## 2021-03-13 MED ORDER — ACETAMINOPHEN 500 MG PO TABS
1000.0000 mg | ORAL_TABLET | Freq: Once | ORAL | Status: AC
  Administered 2021-03-13: 1000 mg via ORAL
  Filled 2021-03-13: qty 2

## 2021-03-13 MED ORDER — SODIUM CHLORIDE 0.9 % IV SOLN: INTRAVENOUS | Status: DC

## 2021-03-13 MED ORDER — FENTANYL CITRATE (PF) 100 MCG/2ML IJ SOLN
25.0000 ug | INTRAMUSCULAR | Status: DC | PRN
  Administered 2021-03-13: 50 ug via INTRAVENOUS

## 2021-03-13 MED ORDER — CHLORHEXIDINE GLUCONATE 0.12 % MT SOLN: 15.0000 mL | Freq: Once | OROMUCOSAL | Status: DC

## 2021-03-13 MED ORDER — NALOXONE HCL 0.4 MG/ML IJ SOLN: 0.4000 mg | INTRAMUSCULAR | Status: DC | PRN

## 2021-03-13 MED ORDER — PROPOFOL 10 MG/ML IV BOLUS
INTRAVENOUS | Status: DC | PRN
  Administered 2021-03-13: 130 mg via INTRAVENOUS

## 2021-03-13 MED ORDER — 0.9 % SODIUM CHLORIDE (POUR BTL) OPTIME
TOPICAL | Status: DC | PRN
  Administered 2021-03-13: 1000 mL

## 2021-03-13 MED ORDER — CHLORHEXIDINE GLUCONATE 0.12 % MT SOLN
OROMUCOSAL | Status: AC
  Filled 2021-03-13: qty 15

## 2021-03-13 MED ORDER — MIDAZOLAM HCL 2 MG/2ML IJ SOLN
INTRAMUSCULAR | Status: AC
  Filled 2021-03-13: qty 2

## 2021-03-13 MED ORDER — FENTANYL CITRATE (PF) 250 MCG/5ML IJ SOLN
INTRAMUSCULAR | Status: DC | PRN
  Administered 2021-03-13 (×2): 25 ug via INTRAVENOUS
  Administered 2021-03-13: 50 ug via INTRAVENOUS

## 2021-03-13 MED ORDER — ONDANSETRON HCL 4 MG/2ML IJ SOLN: 4.0000 mg | Freq: Once | INTRAMUSCULAR | Status: DC | PRN

## 2021-03-13 MED ORDER — DEXAMETHASONE SODIUM PHOSPHATE 10 MG/ML IJ SOLN
INTRAMUSCULAR | Status: DC | PRN
  Administered 2021-03-13: 5 mg via INTRAVENOUS

## 2021-03-13 MED ORDER — FENTANYL CITRATE (PF) 250 MCG/5ML IJ SOLN
INTRAMUSCULAR | Status: AC
  Filled 2021-03-13: qty 5

## 2021-03-13 MED ORDER — DIPHENHYDRAMINE HCL 12.5 MG/5ML PO ELIX: 12.5000 mg | ORAL_SOLUTION | Freq: Four times a day (QID) | ORAL | Status: DC | PRN

## 2021-03-13 MED ORDER — DIPHENHYDRAMINE HCL 50 MG/ML IJ SOLN: 12.5000 mg | Freq: Four times a day (QID) | INTRAMUSCULAR | Status: DC | PRN

## 2021-03-13 SURGICAL SUPPLY — 51 items
BANDAGE ESMARK 6X9 LF (GAUZE/BANDAGES/DRESSINGS) IMPLANT
BLADE LONG MED 31MMX9MM (MISCELLANEOUS)
BLADE LONG MED 31X9 (MISCELLANEOUS) IMPLANT
BLADE SAW GIGLI 510 (BLADE) ×2 IMPLANT
BLADE SAW GIGLI 510MM (BLADE) ×1
BNDG CMPR 9X6 STRL LF SNTH (GAUZE/BANDAGES/DRESSINGS)
BNDG COHESIVE 6X5 TAN STRL LF (GAUZE/BANDAGES/DRESSINGS) ×3 IMPLANT
BNDG ELASTIC 4X5.8 VLCR STR LF (GAUZE/BANDAGES/DRESSINGS) ×2 IMPLANT
BNDG ELASTIC 6X5.8 VLCR STR LF (GAUZE/BANDAGES/DRESSINGS) IMPLANT
BNDG ESMARK 6X9 LF (GAUZE/BANDAGES/DRESSINGS)
BNDG GAUZE ELAST 4 BULKY (GAUZE/BANDAGES/DRESSINGS) ×2 IMPLANT
CANISTER SUCT 3000ML PPV (MISCELLANEOUS) ×3 IMPLANT
CLIP VESOCCLUDE MED 6/CT (CLIP) IMPLANT
COVER SURGICAL LIGHT HANDLE (MISCELLANEOUS) ×3 IMPLANT
COVER WAND RF STERILE (DRAPES) ×3 IMPLANT
CUFF TOURN SGL QUICK 34 (TOURNIQUET CUFF)
CUFF TOURN SGL QUICK 42 (TOURNIQUET CUFF) IMPLANT
CUFF TRNQT CYL 34X4.125X (TOURNIQUET CUFF) IMPLANT
DRAIN CHANNEL 19F RND (DRAIN) IMPLANT
DRAPE HALF SHEET 40X57 (DRAPES) ×3 IMPLANT
DRAPE ORTHO SPLIT 77X108 STRL (DRAPES) ×6
DRAPE SURG ORHT 6 SPLT 77X108 (DRAPES) ×2 IMPLANT
DRSG ADAPTIC 3X8 NADH LF (GAUZE/BANDAGES/DRESSINGS) ×3 IMPLANT
ELECT REM PT RETURN 9FT ADLT (ELECTROSURGICAL) ×3
ELECTRODE REM PT RTRN 9FT ADLT (ELECTROSURGICAL) ×1 IMPLANT
EVACUATOR SILICONE 100CC (DRAIN) IMPLANT
GAUZE SPONGE 4X4 12PLY STRL (GAUZE/BANDAGES/DRESSINGS) ×3 IMPLANT
GLOVE BIO SURGEON STRL SZ7.5 (GLOVE) ×3 IMPLANT
GOWN STRL REUS W/ TWL LRG LVL3 (GOWN DISPOSABLE) ×2 IMPLANT
GOWN STRL REUS W/ TWL XL LVL3 (GOWN DISPOSABLE) ×1 IMPLANT
GOWN STRL REUS W/TWL LRG LVL3 (GOWN DISPOSABLE) ×6
GOWN STRL REUS W/TWL XL LVL3 (GOWN DISPOSABLE) ×3
KIT BASIN OR (CUSTOM PROCEDURE TRAY) ×3 IMPLANT
KIT TURNOVER KIT B (KITS) ×3 IMPLANT
NS IRRIG 1000ML POUR BTL (IV SOLUTION) ×3 IMPLANT
PACK GENERAL/GYN (CUSTOM PROCEDURE TRAY) ×3 IMPLANT
PAD ARMBOARD 7.5X6 YLW CONV (MISCELLANEOUS) ×6 IMPLANT
STAPLER VISISTAT 35W (STAPLE) ×3 IMPLANT
STOCKINETTE IMPERVIOUS LG (DRAPES) ×3 IMPLANT
SUT BONE WAX W31G (SUTURE) IMPLANT
SUT ETHILON 3 0 PS 1 (SUTURE) IMPLANT
SUT SILK 0 TIES 10X30 (SUTURE) ×3 IMPLANT
SUT SILK 2 0 (SUTURE) ×6
SUT SILK 2 0 SH CR/8 (SUTURE) ×3 IMPLANT
SUT SILK 2-0 18XBRD TIE 12 (SUTURE) ×1 IMPLANT
SUT SILK 3 0 (SUTURE)
SUT SILK 3-0 18XBRD TIE 12 (SUTURE) IMPLANT
SUT VIC AB 2-0 CT1 18 (SUTURE) ×8 IMPLANT
TOWEL GREEN STERILE (TOWEL DISPOSABLE) ×6 IMPLANT
UNDERPAD 30X36 HEAVY ABSORB (UNDERPADS AND DIAPERS) ×3 IMPLANT
WATER STERILE IRR 1000ML POUR (IV SOLUTION) ×3 IMPLANT

## 2021-03-13 NOTE — Progress Notes (Signed)
Page sent to Dr. Donzetta Matters to attempt to get additional pain medication for pt as well as verify if it is okay to resume diet.   Justice Rocher, RN

## 2021-03-13 NOTE — Anesthesia Preprocedure Evaluation (Signed)
Anesthesia Evaluation  Patient identified by MRN, date of birth, ID band Patient awake    Reviewed: Allergy & Precautions, NPO status , Patient's Chart, lab work & pertinent test results  Airway Mallampati: II  TM Distance: >3 FB Neck ROM: Full    Dental  (+) Teeth Intact, Dental Advisory Given   Pulmonary asthma , COPD, Current Smoker and Patient abstained from smoking.,    Pulmonary exam normal breath sounds clear to auscultation       Cardiovascular hypertension, + Peripheral Vascular Disease  Normal cardiovascular exam Rhythm:Regular Rate:Normal  Echo 07/29/20: 1. Left ventricular ejection fraction, by estimation, is 55 to 60%. The  left ventricle has normal function. The left ventricle has no regional  wall motion abnormalities. Left ventricular diastolic parameters are  indeterminate.  2. Right ventricular systolic function is normal. The right ventricular  size is normal.  3. Left atrial size was severely dilated.  4. The mitral valve is normal in structure. Mild mitral valve  regurgitation. No evidence of mitral stenosis.  5. The aortic valve is tricuspid. Aortic valve regurgitation is not  visualized. No aortic stenosis is present.  6. The inferior vena cava is normal in size with greater than 50%  respiratory variability, suggesting right atrial pressure of 3 mmHg.    Neuro/Psych negative neurological ROS     GI/Hepatic GERD  Medicated,(+) Hepatitis -, C  Endo/Other  diabetes, Type 2, Oral Hypoglycemic Agents, Insulin Dependent  Renal/GU ESRF and DialysisRenal disease     Musculoskeletal RIGHT FOOT WOUND   Abdominal   Peds  Hematology  (+) Blood dyscrasia (Plavix), anemia ,   Anesthesia Other Findings Day of surgery medications reviewed with the patient.  Reproductive/Obstetrics                             Anesthesia Physical Anesthesia Plan  ASA: III  Anesthesia Plan:  General   Post-op Pain Management:    Induction: Intravenous  PONV Risk Score and Plan: 2 and Midazolam, Dexamethasone and Ondansetron  Airway Management Planned: LMA  Additional Equipment:   Intra-op Plan:   Post-operative Plan: Extubation in OR  Informed Consent: I have reviewed the patients History and Physical, chart, labs and discussed the procedure including the risks, benefits and alternatives for the proposed anesthesia with the patient or authorized representative who has indicated his/her understanding and acceptance.     Dental advisory given  Plan Discussed with: CRNA  Anesthesia Plan Comments:         Anesthesia Quick Evaluation

## 2021-03-13 NOTE — Progress Notes (Addendum)
PROGRESS NOTE  ODIS Hall KCL:[id number] DOB: 26-Apr-1956 DOA: 03/07/2021 PCP: Duane Partridge, PA   LOS: 6 days   Brief narrative:  Duane Hall is a 65 years old male with history of end-stage renal disease on hemodialysis, type 2 diabetes, peripheral vascular disease who had undergone amputation on 01/12/21 due to gangrene, being followed by wound care as outpatient, presented to the hospital with increasing redness and dryness at the amputation site.  Patient had chills dizziness, sweatiness during hemodialysis session and was noted to be febrile and confused so he was sent into the hospital.  In the ED, patient was noted to be hypotensive, tachycardic with a fever of 103F.  He was then admitted to the ICU for further evaluation and treatment secondary to severe sepsis.  Patient was subsequently transferred out of the ICU.  Seen by vascular surgery who have recommendations for below-knee amputation.  Assessment/Plan:  Principal Problem:   Sepsis ([place]) Active Problems:   Anemia of chronic disease   Hypokalemia   Diabetic foot ulcer (HCC)   Benign prostatic hyperplasia with weak urinary stream   ESRD (end stage renal disease) ([place])   Uncontrolled type 2 diabetes mellitus with hyperglycemia (HCC)   Osteomyelitis of right foot (HCC)   Gangrene (HCC)  Severe sepsis secondary to right foot gangrene/osteomyelitis Right foot On vancomycin and meropenem.  Vascular surgery on board.  Planning for below-knee amputation with vascular surgery on 03/14/2019.Duane Hall  Continue to hold the Plavix.  ESRD on hemodialysis at home. Hypotensive with 4 x weekly HD at home. Nephrology on board for hemodialysis.  Peripheral Vascular Disease Patient with recent balloon angioplasty and atherectomy 12/27 of right SFA and posterior tibial artery.  Vascular surgery on board.  Continue aspirin and statin.  Holding Plavix until amputation  Diarrhea Likely antibiotic associated.  Imodium as needed.  C.  difficile negative. continue probiotic  Diabetes mellitus type II with hyperglycemia On insulin pump at home which has been disconnected.  Currently on Lantus 10 units and NovoLog sliding-scale insulin.  Continue diabetic diet, Accu-Cheks, closely monitor blood glucose levels. Currently NPO for surgery  Hypokalemia, resolved  Hyponatremia Mild.  Will monitor with BMP.  Anemia of Chronic Disease Taken off iron supplements due to ongoing infection.  Transfuse for hemoglobin less than 7.  Hemoglobin of 8.7 today.  Tobacco abuse Counseling done.  Chronic pain on low-dose gabapentin; Percocet  Deconditionoing Patient is currently with right foot infection waiting for surgical intervention.  Physical therapy and Occupational Therapy -recommend CIR   DVT prophylaxis: heparin injection 5,000 Units Start: 03/08/21 1400  Code Status: Full code  Family Communication:  None  Status is: Inpatient  Remains inpatient appropriate because:IV treatments appropriate due to intensity of illness or inability to take PO, Inpatient level of care appropriate due to severity of illness and Surgical intervention pending   Dispo: The patient is from: Home              Anticipated d/c is to: Rehabilitation CIR/SNF as per PT              Patient currently is not medically stable to d/c.   Difficult to place patient No  Consultants:  PCCM  Podiatry,   Vascular surgery  Procedures:  None  Anti-infectives:  Duane Hall Vancomycin 3/10> . Meropenem 3/10>  Anti-infectives (From admission, onward)   Start     Dose/Rate Route Frequency Ordered Stop   03/12/21 1200  vancomycin (VANCOCIN) IVPB 750 mg/150 ml premix  750 mg 150 mL/hr over 60 Minutes Intravenous Every M-W-F (Hemodialysis) 03/12/21 1055 03/13/21 0739   03/09/21 1400  vancomycin (VANCOREADY) IVPB 750 mg/150 mL        750 mg 150 mL/hr over 60 Minutes Intravenous  Once 03/09/21 0927 03/09/21 1601   03/09/21 0800  [Duane Hold]   meropenem (MERREM) 500 mg in sodium chloride 0.9 % 100 mL IVPB        (Duane Hold since Tue 03/13/2021 at 0819.Hold Reason: Transfer to a Procedural area.)   500 mg 200 mL/hr over 30 Minutes Intravenous Every 24 hours 03/08/21 0733     03/08/21 1145  cefTRIAXone (ROCEPHIN) 2 g in sodium chloride 0.9 % 100 mL IVPB  Status:  Discontinued        2 g 200 mL/hr over 30 Minutes Intravenous Every 24 hours 03/08/21 1134 03/08/21 1137   03/08/21 0745  vancomycin (VANCOREADY) IVPB 500 mg/100 mL        500 mg 100 mL/hr over 60 Minutes Intravenous  Once 03/08/21 0733 03/08/21 1102   03/08/21 0400  meropenem (MERREM) 1 g in sodium chloride 0.9 % 100 mL IVPB  Status:  Discontinued        1 g 200 mL/hr over 30 Minutes Intravenous Every 12 hours 03/08/21 0349 03/08/21 0733   03/07/21 2208  [Duane Hold]  vancomycin variable dose per unstable renal function (pharmacist dosing)        (Duane Hold since Tue 03/13/2021 at 0819.Hold Reason: Transfer to a Procedural area.)    Does not apply See admin instructions 03/07/21 2208     03/07/21 2030  vancomycin (VANCOREADY) IVPB 1000 mg/200 mL        1,000 mg 200 mL/hr over 60 Minutes Intravenous  Once 03/07/21 2018 03/07/21 2230   03/07/21 2030  cefTRIAXone (ROCEPHIN) 2 g in sodium chloride 0.9 % 100 mL IVPB        2 g 200 mL/hr over 30 Minutes Intravenous  Once 03/07/21 2018 03/07/21 2110     Subjective: Today, patient was seen and examined bedside.  Denies overt pain at this time he is awaiting for surgical intervention  Objective: Vitals:   03/13/21 1040 03/13/21 1050  BP: (!) 105/57 (!) 105/56  Pulse: 92 88  Resp: 16 15  Temp:    SpO2: 95% 94%    Intake/Output Summary (Last 24 hours) at 03/13/2021 1057 Last data filed at 03/13/2021 1009 Gross per 24 hour  Intake 710 ml  Output 25 ml  Net 685 ml   Filed Weights   03/07/21 1943  Weight: 75 kg   Body mass index is 24.42 kg/m.   Physical Exam: General:  Average built, not in obvious distress,  chronically ill HENT:   No scleral pallor or icterus noted. Oral mucosa is moist.  Chest:  Clear breath sounds.  Diminished breath sounds bilaterally. No crackles or wheezes.  CVS: S1 &S2 heard. No murmur.  Regular rate and rhythm. Abdomen: Soft, nontender, nondistended.  Bowel sounds are heard.   Extremities: Right foot  bandaged, left upper extremity AV fistula in place. Psych: Alert, awake and mildly communicative, slightly low in mood, CNS:  No cranial nerve deficits.  Power equal in all extremities.   Skin:   Right foot bandaged.   Data Review: I have personally reviewed the following laboratory data and studies,  CBC: Recent Labs  Lab 03/07/21 2017 03/08/21 1203 03/08/21 1834 03/09/21 0533 03/12/21 0151 03/13/21 0536  WBC 10.7* 11.8* 9.8 7.8 7.3 7.9  NEUTROABS 9.2* 10.1*  --   --   --   --   HGB 9.6* 10.1* 9.4* 9.1* 8.8* 8.7*  HCT 27.7* 29.4* 28.4* 26.2* 26.7* 27.8*  MCV 90.5 92.7 92.8 91.3 94.3 97.2  PLT 154 175 171 152 167 578   Basic Metabolic Panel: Recent Labs  Lab 03/08/21 1203 03/08/21 1834 03/09/21 0533 03/10/21 0100 03/12/21 0151 03/13/21 0536  NA 133* 130* 133* 134* 133* 135  K 3.0* 3.2* 3.2* 3.8 3.8 4.0  CL 93* 90* 94* 99 98 98  CO2 27 24 24 26 24 24   GLUCOSE 152* 368* 256* 198* 164* 136*  BUN 36* 41* 48* 22 54* 66*  CREATININE 6.35* 6.79* 7.25* 4.48* 7.46* 9.23*  CALCIUM 8.5* 8.5* 8.3* 8.3* 8.0* 8.3*  MG 1.7  --  1.8  --  1.7 1.9  PHOS  --   --  3.1  --   --  2.8   Liver Function Tests: Recent Labs  Lab 03/07/21 2017 03/08/21 1203 03/08/21 1834  AST 40 58* 57*  ALT 25 27 31   ALKPHOS 113 96 117  BILITOT 0.6 0.7 0.8  PROT 6.1* 6.0* 6.0*  ALBUMIN 2.9* 2.9* 2.8*   No results for input(s): LIPASE, AMYLASE in the last 168 hours. No results for input(s): AMMONIA in the last 168 hours. Cardiac Enzymes: No results for input(s): CKTOTAL, CKMB, CKMBINDEX, TROPONINI in the last 168 hours. BNP (last 3 results) No results for input(s): BNP in the  last 8760 hours.  ProBNP (last 3 results) No results for input(s): PROBNP in the last 8760 hours.  CBG: Recent Labs  Lab 03/12/21 1144 03/12/21 1531 03/12/21 2124 03/13/21 0810 03/13/21 1012  GLUCAP 231* 107* 124* 162* 184*   Recent Results (from the past 240 hour(s))  Blood Culture (routine x 2)     Status: None (Preliminary result)   Collection Time: 03/07/21  8:22 PM   Specimen: BLOOD  Result Value Ref Range Status   Specimen Description BLOOD SITE NOT SPECIFIED  Final   Special Requests   Final    BOTTLES DRAWN AEROBIC AND ANAEROBIC Blood Culture adequate volume   Culture   Final    NO GROWTH 4 DAYS Performed at [facility], [place] [address] [place], [place] [address]    Report Status PENDING  Incomplete  Blood Culture (routine x 2)     Status: None (Preliminary result)   Collection Time: 03/07/21  8:46 PM   Specimen: BLOOD LEFT HAND  Result Value Ref Range Status   Specimen Description BLOOD LEFT HAND  Final   Special Requests   Final    BOTTLES DRAWN AEROBIC AND ANAEROBIC Blood Culture adequate volume   Culture   Final    NO GROWTH 4 DAYS Performed at [facility], [place] [address] [place], [place] [address]    Report Status PENDING  Incomplete  Resp Panel by RT-PCR (Flu A&B, Covid) Nasopharyngeal Swab     Status: None   Collection Time: 03/08/21  2:35 AM   Specimen: Nasopharyngeal Swab; Nasopharyngeal(NP) swabs in vial transport medium  Result Value Ref Range Status   SARS Coronavirus 2 by RT PCR NEGATIVE NEGATIVE Final    Comment: (NOTE) SARS-CoV-2 target nucleic acids are NOT DETECTED.  The SARS-CoV-2 RNA is generally detectable in upper respiratory specimens during the acute phase of infection. The lowest concentration of SARS-CoV-2 viral copies this assay can detect is 138 copies/mL. A negative result does not preclude SARS-Cov-2 infection and  should not be used as the sole basis for treatment or other patient management decisions. A negative  result may occur with  improper specimen collection/handling, submission of specimen other than nasopharyngeal swab, presence of viral mutation(s) within the areas targeted by this assay, and inadequate number of viral copies(<138 copies/mL). A negative result must be combined with clinical observations, patient history, and epidemiological information. The expected result is Negative.  Fact Sheet for Patients:  EntrepreneurPulse.com.au  Fact Sheet for Healthcare Providers:  IncredibleEmployment.be  This test is no t yet approved or cleared by the [place] FDA and  has been authorized for detection and/or diagnosis of SARS-CoV-2 by FDA under an Emergency Use Authorization (EUA). This EUA will remain  in effect (meaning this test can be used) for the duration of the COVID-19 declaration under Section 564(b)(1) of the Act, 21 U.S.C.section 360bbb-3(b)(1), unless the authorization is terminated  or revoked sooner.       Influenza A by PCR NEGATIVE NEGATIVE Final   Influenza B by PCR NEGATIVE NEGATIVE Final    Comment: (NOTE) The Xpert Xpress SARS-CoV-2/FLU/RSV plus assay is intended as an aid in the diagnosis of influenza from Nasopharyngeal swab specimens and should not be used as a sole basis for treatment. Nasal washings and aspirates are unacceptable for Xpert Xpress SARS-CoV-2/FLU/RSV testing.  Fact Sheet for Patients: EntrepreneurPulse.com.au  Fact Sheet for Healthcare Providers: IncredibleEmployment.be  This test is not yet approved or cleared by the [place] FDA and has been authorized for detection and/or diagnosis of SARS-CoV-2 by FDA under an Emergency Use Authorization (EUA). This EUA will remain in effect (meaning this test can be used) for the duration of the COVID-19 declaration under Section 564(b)(1) of the Act, 21 U.S.C. section 360bbb-3(b)(1), unless the authorization is  terminated or revoked.  Performed at [facility], [place] [address] [place], [place] [address]   MRSA PCR Screening     Status: None   Collection Time: 03/08/21  6:40 PM   Specimen: Nasal Mucosa; Nasopharyngeal  Result Value Ref Range Status   MRSA by PCR NEGATIVE NEGATIVE Final    Comment:        The GeneXpert MRSA Assay (FDA approved for NASAL specimens only), is one component of a comprehensive MRSA colonization surveillance program. It is not intended to diagnose MRSA infection nor to guide or monitor treatment for MRSA infections. Performed at [facility], [place] [address] [place], [place] [address]   Gastrointestinal Panel by PCR , Stool     Status: None   Collection Time: 03/09/21  3:52 AM   Specimen: STOOL  Result Value Ref Range Status   Campylobacter species NOT DETECTED NOT DETECTED Final   Plesimonas shigelloides NOT DETECTED NOT DETECTED Final   Salmonella species NOT DETECTED NOT DETECTED Final   Yersinia enterocolitica NOT DETECTED NOT DETECTED Final   Vibrio species NOT DETECTED NOT DETECTED Final   Vibrio cholerae NOT DETECTED NOT DETECTED Final   Enteroaggregative E coli (EAEC) NOT DETECTED NOT DETECTED Final   Enteropathogenic E coli (EPEC) NOT DETECTED NOT DETECTED Final   Enterotoxigenic E coli (ETEC) NOT DETECTED NOT DETECTED Final   Shiga like toxin producing E coli (STEC) NOT DETECTED NOT DETECTED Final   Shigella/Enteroinvasive E coli (EIEC) NOT DETECTED NOT DETECTED Final   Cryptosporidium NOT DETECTED NOT DETECTED Final   Cyclospora cayetanensis NOT DETECTED NOT DETECTED Final   Entamoeba histolytica NOT DETECTED NOT DETECTED Final   Giardia lamblia NOT DETECTED NOT DETECTED  Final   Adenovirus F40/41 NOT DETECTED NOT DETECTED Final   Astrovirus NOT DETECTED NOT DETECTED Final   Norovirus GI/GII NOT DETECTED NOT DETECTED Final   Rotavirus A NOT DETECTED NOT DETECTED Final   Sapovirus (I, II, IV, and V) NOT DETECTED NOT DETECTED Final     Comment: Performed at [facility], [place], [place] [address]  C Difficile Quick Screen w PCR reflex     Status: None   Collection Time: 03/09/21  3:52 AM   Specimen: STOOL  Result Value Ref Range Status   C Diff antigen NEGATIVE NEGATIVE Final   C Diff toxin NEGATIVE NEGATIVE Final   C Diff interpretation No C. difficile detected.  Final    Comment: Performed at [facility], [place] [address], [place], [place] [address]     Studies: No results found.    Flora Lipps, MD  [facility] 03/13/2021  If 7PM-7AM, please contact night-coverage

## 2021-03-13 NOTE — Progress Notes (Addendum)
Pt left unit to go to short stay for right BKA. Report was called to short stay RN, CHG bath completed, full assessment completed, blood sugar 162 at 0810 - no insulin given. Pt has been NPO since midnight and consent was obtained by Varney Biles, RN on 3/14. Pt insisted on keeping his cell phone and glasses on his person, so he was sent to short stay with those items.   Justice Rocher, RN

## 2021-03-13 NOTE — Progress Notes (Addendum)
Pt and wife had called office about pt's uncontrolled pain after right BKA this morning.  I spoke with pt and his wife via speaker phone in the room.  She had concerns about why the nerve block was not done.  I let her know that anesthesia does the nerve blocks and I don't have any information about why this was not done.     She had spoken to Dr. Scot Dock and a high dose dilaudid pca was ordered.  I have stopped the IV push dilaudid and po percocet while pt is on PCA.  I discussed with RN and she will get this started as soon as she can.  I let pt know I had talked with his nurse.   Dressing is dry and in tact.  Will take down dressing on POD 2.    Leontine Locket, [facility] 03/13/2021 2:13 PM

## 2021-03-13 NOTE — Transfer of Care (Signed)
Immediate Anesthesia Transfer of Care Note  Patient: Duane Hall [facility]  Procedure(s) Performed: RIGHT BELOW KNEE AMPUTATION (Right Knee)  Patient Location: PACU  Anesthesia Type:General  Level of Consciousness: awake, alert , patient cooperative and responds to stimulation  Airway & Oxygen Therapy: Patient Spontanous Breathing  Post-op Assessment: Report given to RN and Post -op Vital signs reviewed and stable  Post vital signs: Reviewed and stable  Last Vitals:  Vitals Value Taken Time  BP 101/58 03/13/21 1008  Temp    Pulse 96 03/13/21 1009  Resp 16 03/13/21 1009  SpO2 99 % 03/13/21 1009  Vitals shown include unvalidated device data.  Last Pain:  Vitals:   03/13/21 0558  TempSrc: Oral  PainSc:       Patients Stated Pain Goal: 0 (61/22/44 9753)  Complications: No complications documented.

## 2021-03-13 NOTE — Progress Notes (Signed)
Pt back from OR, report given over the phone and at bedside from PACU RN. Pt a&ox4.   Justice Rocher, RN

## 2021-03-13 NOTE — Anesthesia Procedure Notes (Signed)
Procedure Name: LMA Insertion Date/Time: 03/13/2021 9:13 AM Performed by: Glynda Jaeger, CRNA Pre-anesthesia Checklist: Patient identified, Emergency Drugs available, Suction available and Patient being monitored Patient Re-evaluated:Patient Re-evaluated prior to induction Oxygen Delivery Method: Circle System Utilized Preoxygenation: Pre-oxygenation with 100% oxygen Induction Type: IV induction Ventilation: Mask ventilation without difficulty LMA: LMA inserted LMA Size: 4.0 Number of attempts: 1 Airway Equipment and Method: Bite block Placement Confirmation: positive ETCO2 Tube secured with: Tape Dental Injury: Teeth and Oropharynx as per pre-operative assessment

## 2021-03-13 NOTE — Op Note (Signed)
    Patient name: Duane Hall MRN: [id number] DOB: February 14, 1956 Sex: male  03/13/2021 Pre-operative Diagnosis: right foot wound Post-operative diagnosis:  Same Surgeon:  Erlene Quan C. Donzetta Matters, MD Procedure Performed:  Right below amputation  Indications: 65 year old male has undergone revascularization of his bilateral lower extremities.  He now has a nonhealing toe amputation site on the right and is indicated for transtibial amputation.  Findings: All tissue below the knee was healthy and reapproximated easily   Procedure:  The patient was identified in the holding area and taken to the operating room the operating room where is placed supine on operative table.  LMA anesthesia was induced.  He was sterilely prepped and draped in the right lower extremity usual fashion antibiotics were ministered a timeout was called.  Two thirds and 1/3 type incision was planned below the knee.  The leg was exsanguinated with Esmarch and tourniquet inflated.  Incision was traced with 10 blade.  Cautery was used to traced down through the muscle and fascia to the level of the bone.  The tibia was transected with Gigli.  The fibula was transected with bone cutter.  Posterior flap was created with amputation knife.  All vessels were clamped.  Tourniquet was allowed down.  Vessels were suture-ligated.  The nerve was pulled on tension ligated with Vicryl tie and divided.  We thoroughly irrigated the wound and obtain hemostasis.  The bones were smoothed with rasp.  We reapproximated the anterior posterior fascial layers with erupted Vicryl suture.  Staples were placed at the level the skin.  A sterile dressing was placed.  He was awakened from anesthesia having tolerated procedure without any complication.  All counts were correct at completion peer   EBL: [place]. Donzetta Matters, MD [facility] Office: [phone] Pager: [phone]

## 2021-03-13 NOTE — Progress Notes (Signed)
Duane Hall   Subjective:   Brief history: Is a 65-year-old gentleman with history end-stage renal disease, type 2 diabetes peripheral vascular disease status post amputation 01/12/2021.  Presents with septic syndrome and redness over amputation site.  Plan is for below-knee amputation per vascular surgery  Patient is undergoing dialysis 03/13/2021 his next dialysis treatment 03/15/2021  Blood pressure 108/70 pulse 97 temperature 98.6 O2 sats room air  Sodium 135 potassium 4 chloride 98 CO2 24 BUN 6 6 creatinine 9.23 glucose 136 calcium 8.3 hemoglobin 8.7    Objective:  Vital signs in last 24 hours:  Temp:  [97 F (36.1 C)-99.2 F (37.3 C)] 98.6 F (37 C) (03/15 0558) Pulse Rate:  [88-97] 97 (03/15 0558) Resp:  [12-20] 16 (03/15 0558) BP: (87-120)/(56-76) 108/70 (03/15 0558) SpO2:  [98 %-100 %] 98 % (03/14 1533)  Weight change:  Filed Weights   03/07/21 1943  Weight: 75 kg    Intake/Output: I/O last 3 completed shifts: In: 660 [P.O.:660] Out: 0    Intake/Output this shift:  No intake/output data recorded.    alert, nad , chronic ill appearing  no jvd  Chest cta bilat  Cor reg no RG  Abd soft ntnd no ascites   Ext no LE edema, R foot wrapped   Alert, NF, ox3   L AVF +bruit   Basic Metabolic Panel: Recent Labs  Lab 03/08/21 1203 03/08/21 1834 03/09/21 0533 03/10/21 0100 03/12/21 0151 03/13/21 0536  NA 133* 130* 133* 134* 133* 135  K 3.0* 3.2* 3.2* 3.8 3.8 4.0  CL 93* 90* 94* 99 98 98  CO2 27 24 24 26 24 24   GLUCOSE 152* 368* 256* 198* 164* 136*  BUN 36* 41* 48* 22 54* 66*  CREATININE 6.35* 6.79* 7.25* 4.48* 7.46* 9.23*  CALCIUM 8.5* 8.5* 8.3* 8.3* 8.0* 8.3*  MG 1.7  --  1.8  --  1.7 1.9  PHOS  --   --  3.1  --   --  2.8    Liver Function Tests: Recent Labs  Lab 03/07/21 2017 03/08/21 1203 03/08/21 1834  AST 40 58* 57*  ALT 25 27 31   ALKPHOS 113 96 117  BILITOT 0.6 0.7 0.8  PROT 6.1* 6.0* 6.0*  ALBUMIN 2.9* 2.9*  2.8*   No results for input(s): LIPASE, AMYLASE in the last 168 hours. No results for input(s): AMMONIA in the last 168 hours.  CBC: Recent Labs  Lab 03/07/21 2017 03/08/21 1203 03/08/21 1834 03/09/21 0533 03/12/21 0151 03/13/21 0536  WBC 10.7* 11.8* 9.8 7.8 7.3 7.9  NEUTROABS 9.2* 10.1*  --   --   --   --   HGB 9.6* 10.1* 9.4* 9.1* 8.8* 8.7*  HCT 27.7* 29.4* 28.4* 26.2* 26.7* 27.8*  MCV 90.5 92.7 92.8 91.3 94.3 97.2  PLT 154 175 171 152 167 209    Cardiac Enzymes: No results for input(s): CKTOTAL, CKMB, CKMBINDEX, TROPONINI in the last 168 hours.  BNP: Invalid input(s): POCBNP  CBG: Recent Labs  Lab 03/11/21 2129 03/12/21 0740 03/12/21 1144 03/12/21 1531 03/12/21 2124  GLUCAP 119* 152* 231* 107* 69*    Microbiology: Results for orders placed or performed during the hospital encounter of 03/07/21  Blood Culture (routine x 2)     Status: None (Preliminary result)   Collection Time: 03/07/21  8:22 PM   Specimen: BLOOD  Result Value Ref Range Status   Specimen Description BLOOD SITE NOT SPECIFIED  Final   Special Requests   Final  BOTTLES DRAWN AEROBIC AND ANAEROBIC Blood Culture adequate volume   Culture   Final    NO GROWTH 4 DAYS Performed at [facility], [place] [address], [place], [place] [address]    Report Status PENDING  Incomplete  Blood Culture (routine x 2)     Status: None (Preliminary result)   Collection Time: 03/07/21  8:46 PM   Specimen: BLOOD LEFT HAND  Result Value Ref Range Status   Specimen Description BLOOD LEFT HAND  Final   Special Requests   Final    BOTTLES DRAWN AEROBIC AND ANAEROBIC Blood Culture adequate volume   Culture   Final    NO GROWTH 4 DAYS Performed at [facility], [place] [address] [place], [place] [address]    Report Status PENDING  Incomplete  Resp Panel by RT-PCR (Flu A&B, Covid) Nasopharyngeal Swab     Status: None   Collection Time: 03/08/21  2:35 AM   Specimen: Nasopharyngeal Swab;  Nasopharyngeal(NP) swabs in vial transport medium  Result Value Ref Range Status   SARS Coronavirus 2 by RT PCR NEGATIVE NEGATIVE Final    Comment: (Hall) SARS-CoV-2 target nucleic acids are NOT DETECTED.  The SARS-CoV-2 RNA is generally detectable in upper respiratory specimens during the acute phase of infection. The lowest concentration of SARS-CoV-2 viral copies this assay can detect is 138 copies/mL. A negative result does not preclude SARS-Cov-2 infection and should not be used as the sole basis for treatment or other patient management decisions. A negative result may occur with  improper specimen collection/handling, submission of specimen other than nasopharyngeal swab, presence of viral mutation(s) within the areas targeted by this assay, and inadequate number of viral copies(<138 copies/mL). A negative result must be combined with clinical observations, patient history, and epidemiological information. The expected result is Negative.  Fact Sheet for Patients:  EntrepreneurPulse.com.au  Fact Sheet for Healthcare Providers:  IncredibleEmployment.be  This test is no t yet approved or cleared by the [place] FDA and  has been authorized for detection and/or diagnosis of SARS-CoV-2 by FDA under an Emergency Use Authorization (EUA). This EUA will remain  in effect (meaning this test can be used) for the duration of the COVID-19 declaration under Section 564(b)(1) of the Act, 21 U.S.C.section 360bbb-3(b)(1), unless the authorization is terminated  or revoked sooner.       Influenza A by PCR NEGATIVE NEGATIVE Final   Influenza B by PCR NEGATIVE NEGATIVE Final    Comment: (Hall) The Xpert Xpress SARS-CoV-2/FLU/RSV plus assay is intended as an aid in the diagnosis of influenza from Nasopharyngeal swab specimens and should not be used as a sole basis for treatment. Nasal washings and aspirates are unacceptable for Xpert Xpress  SARS-CoV-2/FLU/RSV testing.  Fact Sheet for Patients: EntrepreneurPulse.com.au  Fact Sheet for Healthcare Providers: IncredibleEmployment.be  This test is not yet approved or cleared by the [place] FDA and has been authorized for detection and/or diagnosis of SARS-CoV-2 by FDA under an Emergency Use Authorization (EUA). This EUA will remain in effect (meaning this test can be used) for the duration of the COVID-19 declaration under Section 564(b)(1) of the Act, 21 U.S.C. section 360bbb-3(b)(1), unless the authorization is terminated or revoked.  Performed at [facility], [place] [address], [place], [place] [address]   MRSA PCR Screening     Status: None   Collection Time: 03/08/21  6:40 PM   Specimen: Nasal Mucosa; Nasopharyngeal  Result Value Ref Range Status   MRSA by  PCR NEGATIVE NEGATIVE Final    Comment:        The GeneXpert MRSA Assay (FDA approved for NASAL specimens only), is one component of a comprehensive MRSA colonization surveillance program. It is not intended to diagnose MRSA infection nor to guide or monitor treatment for MRSA infections. Performed at [facility], [place] [address], [place], [place] [address]   Gastrointestinal Panel by PCR , Stool     Status: None   Collection Time: 03/09/21  3:52 AM   Specimen: STOOL  Result Value Ref Range Status   Campylobacter species NOT DETECTED NOT DETECTED Final   Plesimonas shigelloides NOT DETECTED NOT DETECTED Final   Salmonella species NOT DETECTED NOT DETECTED Final   Yersinia enterocolitica NOT DETECTED NOT DETECTED Final   Vibrio species NOT DETECTED NOT DETECTED Final   Vibrio cholerae NOT DETECTED NOT DETECTED Final   Enteroaggregative E coli (EAEC) NOT DETECTED NOT DETECTED Final   Enteropathogenic E coli (EPEC) NOT DETECTED NOT DETECTED Final   Enterotoxigenic E coli (ETEC) NOT DETECTED NOT DETECTED Final   Shiga like toxin producing E coli (STEC)  NOT DETECTED NOT DETECTED Final   Shigella/Enteroinvasive E coli (EIEC) NOT DETECTED NOT DETECTED Final   Cryptosporidium NOT DETECTED NOT DETECTED Final   Cyclospora cayetanensis NOT DETECTED NOT DETECTED Final   Entamoeba histolytica NOT DETECTED NOT DETECTED Final   Giardia lamblia NOT DETECTED NOT DETECTED Final   Adenovirus F40/41 NOT DETECTED NOT DETECTED Final   Astrovirus NOT DETECTED NOT DETECTED Final   Norovirus GI/GII NOT DETECTED NOT DETECTED Final   Rotavirus A NOT DETECTED NOT DETECTED Final   Sapovirus (I, II, IV, and V) NOT DETECTED NOT DETECTED Final    Comment: Performed at [facility], [address] [place], [place] [address]  C Difficile Quick Screen w PCR reflex     Status: None   Collection Time: 03/09/21  3:52 AM   Specimen: STOOL  Result Value Ref Range Status   C Diff antigen NEGATIVE NEGATIVE Final   C Diff toxin NEGATIVE NEGATIVE Final   C Diff interpretation No C. difficile detected.  Final    Comment: Performed at [facility], [place] [address], [place], [place] [address]    Coagulation Studies: No results for input(s): LABPROT, INR in the last 72 hours.  Urinalysis: No results for input(s): COLORURINE, LABSPEC, PHURINE, GLUCOSEU, HGBUR, BILIRUBINUR, KETONESUR, PROTEINUR, UROBILINOGEN, NITRITE, LEUKOCYTESUR in the last 72 hours.  Invalid input(s): APPERANCEUR    Imaging: No results found.   Medications:   . meropenem (MERREM) IV 500 mg (03/12/21 0934)  . vancomycin 750 mg (03/13/21 3532)   . acetaminophen  1,000 mg Oral Once  . acidophilus  2 capsule Oral Daily  . aspirin  81 mg Oral Daily  . Chlorhexidine Gluconate Cloth  6 each Topical Daily  . darbepoetin (ARANESP) injection - DIALYSIS  60 mcg Intravenous Q Mon-HD  . feeding supplement  1 Container Oral TID BM  . gabapentin  100 mg Oral Daily  . heparin  5,000 Units Subcutaneous Q8H  . insulin aspart  0-5 Units Subcutaneous QHS  . insulin aspart  0-6 Units Subcutaneous  TID WC  . insulin aspart  3 Units Subcutaneous TID WC  . insulin glargine  10 Units Subcutaneous Daily  . loperamide      . multivitamin  1 tablet Oral QHS  . pantoprazole  40 mg Oral Daily  . rosuvastatin  10 mg Oral q morning  .  vancomycin variable dose per unstable renal function (pharmacist dosing)   Does not apply See admin instructions   acetaminophen **OR** acetaminophen, loperamide, ondansetron **OR** ondansetron (ZOFRAN) IV, oxyCODONE-acetaminophen, prochlorperazine  Assessment/ Plan:  OP HD: [facility]. 4 HD per week, [facility] Fri. 76kg edw Hep 3000   Assessment/ Plan: 1. Sepsis D/T Diabetic foot wound- MRI +for osteo R foot. IV abx w/ improving BP's and MS. On Vanc/ meropenem.   Appears BKA is planned for 03/13/2021 2. Hypokalemia-resolved  3. ESRD - Home hemo patient. Had HD here 3/11  Next HD 03/13/2021. Plan 3 HD per week while here , 3.5 - 4h as needed.  4. Hypotension/ volume: no vol excess, under dry by 1kg. Keep even next HD 5. Anemia -   We will add ESA.  Darbepoetin 60 mcg weekly 6. Metabolic bone disease - On Auryxia binders. No VDRA on medication list from [place]. 7. Nutrition - Albumin low. Changed to renal/carb mod diet with fluid restrictions. Add prosource and renal vits. 8. DMT2-per primary 9. H/O Hep C 10. H/O COPD, tobacco use.  11. H/O PAD onclopidogrel.    LOS: [place] @TODAY @7 :31 AM

## 2021-03-13 NOTE — Progress Notes (Signed)
This RN spoke on the phone with Dr. Louanne Belton, who said that he would resume pt's diet as pt is alert and has not other indications to be NPO. Dr. Louanne Belton also added Dilaudid and increased Percocet frequency. Also gave orders to hold on Heparin and will resume tomorrow morning.  Justice Rocher, RN

## 2021-03-13 NOTE — Progress Notes (Signed)
Pt started on Hydromorphone PCA. Harriet Butte, RN as second Psychologist, counselling.   Justice Rocher, RN

## 2021-03-13 NOTE — Anesthesia Postprocedure Evaluation (Signed)
Anesthesia Post Note  Patient: Duane Hall [facility]  Procedure(s) Performed: RIGHT BELOW KNEE AMPUTATION (Right Knee)     Patient location during evaluation: PACU Anesthesia Type: General Level of consciousness: awake and alert Pain management: pain level controlled Vital Signs Assessment: post-procedure vital signs reviewed and stable Respiratory status: spontaneous breathing, nonlabored ventilation, respiratory function stable and patient connected to nasal cannula oxygen Cardiovascular status: blood pressure returned to baseline and stable Postop Assessment: no apparent nausea or vomiting Anesthetic complications: no   No complications documented.  Last Vitals:  Vitals:   03/13/21 1100 03/13/21 1110  BP: 96/62 (!) 101/56  Pulse: 88 89  Resp: 13 14  Temp: 36.7 C   SpO2: 97% 96%    Last Pain:  Vitals:   03/13/21 1110  TempSrc:   PainSc: 4                  Catalina Gravel

## 2021-03-13 NOTE — Progress Notes (Addendum)
Pt is irritable and upset about not being able to get in touch with Dr. Donzetta Matters. However, I am unsure what his reasoning behind needing to talk to Dr. Donzetta Matters is. Pt is having pain, Percocet given, and this RN said that the hospitalist could prescribe pain medication, but the pt is adamant that it is Dr. Donzetta Matters and no one else. I told the pt that I sent a page to Dr. Claretha Cooper pager and the pt said, "That is not an acceptable way to get in touch with someone. How about you come here and I cut your leg off." I attempted to explain to the pt that there are many patients and I am sure the doctor will be in contact when possible but that I will try an alternate way to connect with the Dr. I called the office number for [facility]. The receptionist said that the pt had already been calling many times and had been yelling at her. She has already sent pages to the on-call doctor and to the PA's as well. In the meanwhile I will send a text-page to Dr. Louanne Belton the hospitalist for pain medication.  Justice Rocher, RN

## 2021-03-13 NOTE — Interval H&P Note (Signed)
History and Physical Interval Note:  03/13/2021 8:26 AM  Duane Hall  has presented today for surgery, with the diagnosis of RIGHT FOOT WOUND.  The various methods of treatment have been discussed with the patient and family. After consideration of risks, benefits and other options for treatment, the patient has consented to  Procedure(s): RIGHT BELOW KNEE AMPUTATION (Right) as a surgical intervention.  The patient's history has been reviewed, patient examined, no change in status, stable for surgery.  I have reviewed the patient's chart and labs.  Questions were answered to the patient's satisfaction.     [name]

## 2021-03-14 ENCOUNTER — Encounter (HOSPITAL_COMMUNITY): Payer: Self-pay | Admitting: Vascular Surgery

## 2021-03-14 DIAGNOSIS — Z89511 Acquired absence of right leg below knee: Secondary | ICD-10-CM | POA: Insufficient documentation

## 2021-03-14 LAB — GLUCOSE, CAPILLARY
Glucose-Capillary: 314 mg/dL — ABNORMAL HIGH (ref 70–99)
Glucose-Capillary: 292 mg/dL — ABNORMAL HIGH (ref 70–99)
Glucose-Capillary: 174 mg/dL — ABNORMAL HIGH (ref 70–99)

## 2021-03-14 MED ORDER — LOPERAMIDE HCL 2 MG PO CAPS
4.0000 mg | ORAL_CAPSULE | Freq: Four times a day (QID) | ORAL | Status: DC | PRN
  Administered 2021-03-14 – 2021-03-15 (×3): 4 mg via ORAL
  Filled 2021-03-14 (×3): qty 2

## 2021-03-14 MED ORDER — INSULIN ASPART 100 UNIT/ML ~~LOC~~ SOLN
0.0000 [IU] | Freq: Three times a day (TID) | SUBCUTANEOUS | Status: DC
  Administered 2021-03-14: 8 [IU] via SUBCUTANEOUS
  Administered 2021-03-15 (×2): 5 [IU] via SUBCUTANEOUS
  Administered 2021-03-16 (×2): 3 [IU] via SUBCUTANEOUS

## 2021-03-14 MED ORDER — CLOPIDOGREL BISULFATE 75 MG PO TABS
75.0000 mg | ORAL_TABLET | Freq: Every day | ORAL | Status: DC
  Administered 2021-03-15 – 2021-03-16 (×2): 75 mg via ORAL
  Filled 2021-03-14 (×2): qty 1

## 2021-03-14 MED ORDER — CHLORHEXIDINE GLUCONATE CLOTH 2 % EX PADS
6.0000 | MEDICATED_PAD | Freq: Every day | CUTANEOUS | Status: DC
  Administered 2021-03-14: 6 via TOPICAL

## 2021-03-14 MED ORDER — LOPERAMIDE HCL 2 MG PO CAPS: 4.0000 mg | ORAL_CAPSULE | ORAL | Status: DC | PRN

## 2021-03-14 NOTE — Progress Notes (Addendum)
[facility] ROUNDING NOTE   Subjective:   Brief history: Is a 65-year-old gentleman with history end-stage renal disease, type 2 diabetes peripheral vascular disease status post amputation 01/12/2021.  Presents with septic syndrome and redness over amputation site.  Plan is for below-knee amputation per vascular surgery 03/14/2021  Patient is undergoing dialysis 03/13/2021 his next dialysis treatment 03/15/2021  Blood pressure 108/70 pulse 80 temperature 97.6 O2 sats room air  Sodium 135 potassium 4 chloride 98 CO2 24 BUN 6 6 creatinine 9.23 glucose 136 calcium 8.3 hemoglobin 8.7    Objective:  Vital signs in last 24 hours:  Temp:  [97.6 F (36.4 C)-99.3 F (37.4 C)] 97.6 F (36.4 C) (03/16 0025) Pulse Rate:  [78-96] 78 (03/16 0300) Resp:  [10-20] 11 (03/16 0440) BP: (93-125)/(55-68) 108/55 (03/16 0300) SpO2:  [94 %-99 %] 97 % (03/16 0440) FiO2 (%):  [0 %] 0 % (03/15 1427)  Weight change:  Filed Weights   03/07/21 1943  Weight: 75 kg    Intake/Output: I/O last 3 completed shifts: In: 620 [P.O.:240; I.V.:280; IV Piggyback:100] Out: 25 [Blood:25]   Intake/Output this shift:  No intake/output data recorded.    alert, nad , chronic ill appearing  no jvd  Chest cta bilat  Cor reg no RG  Abd soft ntnd no ascites   Ext no LE edema, R foot wrapped   Alert, NF, ox3   L AVF +bruit   Basic Metabolic Panel: Recent Labs  Lab 03/08/21 1203 03/08/21 1834 03/09/21 0533 03/10/21 0100 03/12/21 0151 03/13/21 0536  NA 133* 130* 133* 134* 133* 135  K 3.0* 3.2* 3.2* 3.8 3.8 4.0  CL 93* 90* 94* 99 98 98  CO2 27 24 24 26 24 24   GLUCOSE 152* 368* 256* 198* 164* 136*  BUN 36* 41* 48* 22 54* 66*  CREATININE 6.35* 6.79* 7.25* 4.48* 7.46* 9.23*  CALCIUM 8.5* 8.5* 8.3* 8.3* 8.0* 8.3*  MG 1.7  --  1.8  --  1.7 1.9  PHOS  --   --  3.1  --   --  2.8    Liver Function Tests: Recent Labs  Lab 03/07/21 2017 03/08/21 1203 03/08/21 1834  AST 40 58* 57*  ALT 25 27 31    ALKPHOS 113 96 117  BILITOT 0.6 0.7 0.8  PROT 6.1* 6.0* 6.0*  ALBUMIN 2.9* 2.9* 2.8*   No results for input(s): LIPASE, AMYLASE in the last 168 hours. No results for input(s): AMMONIA in the last 168 hours.  CBC: Recent Labs  Lab 03/07/21 2017 03/08/21 1203 03/08/21 1834 03/09/21 0533 03/12/21 0151 03/13/21 0536  WBC 10.7* 11.8* 9.8 7.8 7.3 7.9  NEUTROABS 9.2* 10.1*  --   --   --   --   HGB 9.6* 10.1* 9.4* 9.1* 8.8* 8.7*  HCT 27.7* 29.4* 28.4* 26.2* 26.7* 27.8*  MCV 90.5 92.7 92.8 91.3 94.3 97.2  PLT 154 175 171 152 167 209    Cardiac Enzymes: No results for input(s): CKTOTAL, CKMB, CKMBINDEX, TROPONINI in the last 168 hours.  BNP: Invalid input(s): POCBNP  CBG: Recent Labs  Lab 03/13/21 0810 03/13/21 1012 03/13/21 1234 03/13/21 1553 03/13/21 2147  GLUCAP 162* 184* 217* 217* 235*    Microbiology: Results for orders placed or performed during the hospital encounter of 03/07/21  Blood Culture (routine x 2)     Status: None   Collection Time: 03/07/21  8:22 PM   Specimen: BLOOD  Result Value Ref Range Status   Specimen Description BLOOD SITE  NOT SPECIFIED  Final   Special Requests   Final    BOTTLES DRAWN AEROBIC AND ANAEROBIC Blood Culture adequate volume   Culture   Final    NO GROWTH 5 DAYS Performed at [facility], [address], [place], [place] [address]    Report Status 03/13/2021 FINAL  Final  Blood Culture (routine x 2)     Status: None   Collection Time: 03/07/21  8:46 PM   Specimen: BLOOD LEFT HAND  Result Value Ref Range Status   Specimen Description BLOOD LEFT HAND  Final   Special Requests   Final    BOTTLES DRAWN AEROBIC AND ANAEROBIC Blood Culture adequate volume   Culture   Final    NO GROWTH 5 DAYS Performed at [facility], [place] [address] [place], [place] [address]    Report Status 03/13/2021 FINAL  Final  Resp Panel by RT-PCR (Flu A&B, Covid) Nasopharyngeal Swab     Status: None   Collection Time: 03/08/21  2:35 AM    Specimen: Nasopharyngeal Swab; Nasopharyngeal(NP) swabs in vial transport medium  Result Value Ref Range Status   SARS Coronavirus 2 by RT PCR NEGATIVE NEGATIVE Final    Comment: (NOTE) SARS-CoV-2 target nucleic acids are NOT DETECTED.  The SARS-CoV-2 RNA is generally detectable in upper respiratory specimens during the acute phase of infection. The lowest concentration of SARS-CoV-2 viral copies this assay can detect is 138 copies/mL. A negative result does not preclude SARS-Cov-2 infection and should not be used as the sole basis for treatment or other patient management decisions. A negative result may occur with  improper specimen collection/handling, submission of specimen other than nasopharyngeal swab, presence of viral mutation(s) within the areas targeted by this assay, and inadequate number of viral copies(<138 copies/mL). A negative result must be combined with clinical observations, patient history, and epidemiological information. The expected result is Negative.  Fact Sheet for Patients:  EntrepreneurPulse.com.au  Fact Sheet for Healthcare Providers:  IncredibleEmployment.be  This test is no t yet approved or cleared by the [place] FDA and  has been authorized for detection and/or diagnosis of SARS-CoV-2 by FDA under an Emergency Use Authorization (EUA). This EUA will remain  in effect (meaning this test can be used) for the duration of the COVID-19 declaration under Section 564(b)(1) of the Act, 21 U.S.C.section 360bbb-3(b)(1), unless the authorization is terminated  or revoked sooner.       Influenza A by PCR NEGATIVE NEGATIVE Final   Influenza B by PCR NEGATIVE NEGATIVE Final    Comment: (NOTE) The Xpert Xpress SARS-CoV-2/FLU/RSV plus assay is intended as an aid in the diagnosis of influenza from Nasopharyngeal swab specimens and should not be used as a sole basis for treatment. Nasal washings and aspirates are  unacceptable for Xpert Xpress SARS-CoV-2/FLU/RSV testing.  Fact Sheet for Patients: EntrepreneurPulse.com.au  Fact Sheet for Healthcare Providers: IncredibleEmployment.be  This test is not yet approved or cleared by the [place] FDA and has been authorized for detection and/or diagnosis of SARS-CoV-2 by FDA under an Emergency Use Authorization (EUA). This EUA will remain in effect (meaning this test can be used) for the duration of the COVID-19 declaration under Section 564(b)(1) of the Act, 21 U.S.C. section 360bbb-3(b)(1), unless the authorization is terminated or revoked.  Performed at [facility], [place] [address] [place], [place] [address]   MRSA PCR Screening     Status: None   Collection Time: 03/08/21  6:40 PM  Specimen: Nasal Mucosa; Nasopharyngeal  Result Value Ref Range Status   MRSA by PCR NEGATIVE NEGATIVE Final    Comment:        The GeneXpert MRSA Assay (FDA approved for NASAL specimens only), is one component of a comprehensive MRSA colonization surveillance program. It is not intended to diagnose MRSA infection nor to guide or monitor treatment for MRSA infections. Performed at [facility], [place] [address] [place], [place] [address]   Gastrointestinal Panel by PCR , Stool     Status: None   Collection Time: 03/09/21  3:52 AM   Specimen: STOOL  Result Value Ref Range Status   Campylobacter species NOT DETECTED NOT DETECTED Final   Plesimonas shigelloides NOT DETECTED NOT DETECTED Final   Salmonella species NOT DETECTED NOT DETECTED Final   Yersinia enterocolitica NOT DETECTED NOT DETECTED Final   Vibrio species NOT DETECTED NOT DETECTED Final   Vibrio cholerae NOT DETECTED NOT DETECTED Final   Enteroaggregative E coli (EAEC) NOT DETECTED NOT DETECTED Final   Enteropathogenic E coli (EPEC) NOT DETECTED NOT DETECTED Final   Enterotoxigenic E coli (ETEC) NOT DETECTED NOT DETECTED Final   Shiga like  toxin producing E coli (STEC) NOT DETECTED NOT DETECTED Final   Shigella/Enteroinvasive E coli (EIEC) NOT DETECTED NOT DETECTED Final   Cryptosporidium NOT DETECTED NOT DETECTED Final   Cyclospora cayetanensis NOT DETECTED NOT DETECTED Final   Entamoeba histolytica NOT DETECTED NOT DETECTED Final   Giardia lamblia NOT DETECTED NOT DETECTED Final   Adenovirus F40/41 NOT DETECTED NOT DETECTED Final   Astrovirus NOT DETECTED NOT DETECTED Final   Norovirus GI/GII NOT DETECTED NOT DETECTED Final   Rotavirus A NOT DETECTED NOT DETECTED Final   Sapovirus (I, II, IV, and V) NOT DETECTED NOT DETECTED Final    Comment: Performed at [facility], [address] [place], [place] [address]  C Difficile Quick Screen w PCR reflex     Status: None   Collection Time: 03/09/21  3:52 AM   Specimen: STOOL  Result Value Ref Range Status   C Diff antigen NEGATIVE NEGATIVE Final   C Diff toxin NEGATIVE NEGATIVE Final   C Diff interpretation No C. difficile detected.  Final    Comment: Performed at [facility], [place] [address] [place], [place] [address]    Coagulation Studies: No results for input(s): LABPROT, INR in the last 72 hours.  Urinalysis: No results for input(s): COLORURINE, LABSPEC, PHURINE, GLUCOSEU, HGBUR, BILIRUBINUR, KETONESUR, PROTEINUR, UROBILINOGEN, NITRITE, LEUKOCYTESUR in the last 72 hours.  Invalid input(s): APPERANCEUR    Imaging: No results found.   Medications:   . meropenem (MERREM) IV 500 mg (03/12/21 0934)   . acidophilus  2 capsule Oral Daily  . aspirin  81 mg Oral Daily  . Chlorhexidine Gluconate Cloth  6 each Topical Daily  . [START ON 03/20/2021] darbepoetin (ARANESP) injection - DIALYSIS  60 mcg Intravenous Q Tue-HD  . feeding supplement  1 Container Oral TID BM  . gabapentin  100 mg Oral Daily  . heparin  5,000 Units Subcutaneous Q8H  . HYDROmorphone   Intravenous Q4H  . insulin aspart  0-5 Units Subcutaneous QHS  . insulin aspart  0-6 Units  Subcutaneous TID WC  . insulin aspart  3 Units Subcutaneous TID WC  . insulin glargine  10 Units Subcutaneous Daily  . multivitamin  1 tablet Oral QHS  . pantoprazole  40 mg Oral Daily  . rosuvastatin  10 mg Oral q  morning  . vancomycin variable dose per unstable renal function (pharmacist dosing)   Does not apply See admin instructions   acetaminophen **OR** acetaminophen, diphenhydrAMINE **OR** diphenhydrAMINE, loperamide, naloxone **AND** sodium chloride flush, ondansetron **OR** ondansetron (ZOFRAN) IV, prochlorperazine  Assessment/ Plan:  OP HD: [facility]. 4 HD per week, MTuTh Fri. 76kg edw Hep 3000   Assessment/ Plan: 1. Sepsis D/T Diabetic foot wound- MRI +for osteo R foot. IV abx w/ improving BP's and MS. On Vanc/ meropenem.   Appears BKA is planned for 03/14/2021 2. Hypokalemia-resolved  3. ESRD - Home hemo patient.    Next HD 3/16//2022. Plan 3 HD per week while here , 3.5 - 4h as needed.  4. Hypotension/ volume: no vol excess, under dry by 1kg. Keep even next HD 5. Anemia -   We will add ESA.  Darbepoetin 60 mcg weekly 6. Metabolic bone disease - On Auryxia binders. No VDRA on medication list from [facility]. 7. Nutrition - Albumin low. Changed to renal/carb mod diet with fluid restrictions. Add prosource and renal vits. 8. DMT2-per primary 9. H/O Hep C 10. H/O COPD, tobacco use.  11. H/O PAD onclopidogrel.    LOS: [place] @TODAY @7 :06 AM

## 2021-03-14 NOTE — Progress Notes (Addendum)
PROGRESS NOTE  Duane Hall:[id number] DOB: Duane 15, 1957 DOA: 03/07/2021 PCP: Pieter Partridge, PA   LOS: 7 days   Brief narrative:  Duane Hall is a 65 years old male with history of end-stage renal disease on hemodialysis, type 2 diabetes, peripheral vascular disease who had undergone amputation on 01/12/21 due to gangrene, being followed by wound care as outpatient, presented to the hospital with increasing redness and dryness at the amputation site.  Patient had chills dizziness, sweatiness during hemodialysis session and was noted to be febrile and confused so he was sent into the hospital.  In the ED, patient was noted to be hypotensive, tachycardic with a fever of 103F.  He was then admitted to the ICU for further evaluation and treatment secondary to severe sepsis.  Patient was subsequently transferred out of the ICU.  Seen by vascular surgery who have recommendations for below-knee amputation.  Patient underwent below-knee amputation on the right side on 03/13/2021.  Postop patient was put on Dilaudid pump.  Assessment/Plan:  Principal Problem:   Sepsis ([place]) Active Problems:   Anemia of chronic disease   Hypokalemia   Diabetic foot ulcer (HCC)   Benign prostatic hyperplasia with weak urinary stream   ESRD (end stage renal disease) ([place])   Uncontrolled type 2 diabetes mellitus with hyperglycemia (HCC)   Osteomyelitis of right foot (HCC)   Gangrene (HCC)  Severe sepsis secondary to right foot gangrene/osteomyelitis Right foot On vancomycin and meropenem.  Patient underwent below-knee amputation with vascular surgery on 03/13/2021.Marland Kitchen  Continue to hold the Plavix.  Antibiotic recommendations/Plavix as per vascular surgery.  ESRD on hemodialysis at home. Hypotensive with 4 x weekly HD at home. Nephrology on board for hemodialysis.  Peripheral Vascular Disease Patient with recent balloon angioplasty and atherectomy 12/27 of right SFA and posterior tibial artery.  Vascular  surgery on board.  Continue aspirin and statin.  Resume Plavix if okay with vascular surgery.  Diarrhea Patient is frustrated about it.  Likely antibiotic associated.  On Imodium doses has been increased.  C. difficile negative. continue probiotic  Diabetes mellitus type II with hyperglycemia On insulin pump at home which has been disconnected.  Currently on Lantus 10 units and NovoLog sliding-scale insulin.  Sliding scale will be increased.  Continue diabetic diet, Accu-Cheks, closely monitor blood glucose levels.  Hypokalemia, resolved  Hyponatremia Resolved.  Anemia of Chronic Disease Taken off iron supplements due to ongoing infection.  Transfuse for hemoglobin less than 7.  Latest hemoglobin of 8.7  Tobacco abuse Counseling done.  Acute on chronic pain on low-dose gabapentin; Percocet at home.  Currently has been started on Dilaudid pump after amputation  Deconditionoing Physical therapy and Occupational Therapy -recommend CIR   DVT prophylaxis: heparin injection 5,000 Units Start: 03/08/21 1400  Code Status: Full code  Family Communication:  Spoke with the patient's spouse at bedside at length.  Status is: Inpatient  Remains inpatient appropriate because:IV treatments appropriate due to intensity of illness or inability to take PO, Inpatient level of care appropriate due to severity of illness and Surgical intervention pending   Dispo: The patient is from: Home              Anticipated d/c is to: Rehabilitation CIR/SNF as per PT              Patient currently is not medically stable to d/c.   Difficult to place patient No  Consultants:  PCCM  Podiatry,   Vascular surgery  Procedures:  None  Anti-infectives:  Marland Kitchen Vancomycin 3/10> . Meropenem 3/10>  Anti-infectives (From admission, onward)   Start     Dose/Rate Route Frequency Ordered Stop   03/12/21 1200  vancomycin (VANCOCIN) IVPB 750 mg/150 ml premix        750 mg 150 mL/hr over 60 Minutes  Intravenous Every M-W-F (Hemodialysis) 03/12/21 1055 03/13/21 0739   03/09/21 1400  vancomycin (VANCOREADY) IVPB 750 mg/150 mL        750 mg 150 mL/hr over 60 Minutes Intravenous  Once 03/09/21 0927 03/09/21 1601   03/09/21 0800  meropenem (MERREM) 500 mg in sodium chloride 0.9 % 100 mL IVPB        500 mg 200 mL/hr over 30 Minutes Intravenous Every 24 hours 03/08/21 0733     03/08/21 1145  cefTRIAXone (ROCEPHIN) 2 g in sodium chloride 0.9 % 100 mL IVPB  Status:  Discontinued        2 g 200 mL/hr over 30 Minutes Intravenous Every 24 hours 03/08/21 1134 03/08/21 1137   03/08/21 0745  vancomycin (VANCOREADY) IVPB 500 mg/100 mL        500 mg 100 mL/hr over 60 Minutes Intravenous  Once 03/08/21 0733 03/08/21 1102   03/08/21 0400  meropenem (MERREM) 1 g in sodium chloride 0.9 % 100 mL IVPB  Status:  Discontinued        1 g 200 mL/hr over 30 Minutes Intravenous Every 12 hours 03/08/21 0349 03/08/21 0733   03/07/21 2208  vancomycin variable dose per unstable renal function (pharmacist dosing)         Does not apply See admin instructions 03/07/21 2208     03/07/21 2030  vancomycin (VANCOREADY) IVPB 1000 mg/200 mL        1,000 mg 200 mL/hr over 60 Minutes Intravenous  Once 03/07/21 2018 03/07/21 2230   03/07/21 2030  cefTRIAXone (ROCEPHIN) 2 g in sodium chloride 0.9 % 100 mL IVPB        2 g 200 mL/hr over 30 Minutes Intravenous  Once 03/07/21 2018 03/07/21 2110     Subjective: Today, patient was seen in examined at bedside.  Denies any nausea vomiting but complains of diarrhea.  On Dilaudid pump for severe pain.  Pain is better controlled  Objective: Vitals:   03/14/21 1128 03/14/21 1200  BP: (!) 101/58   Pulse: 87   Resp: 16 16  Temp: 98.3 F (36.8 C)   SpO2: 97% 98%    Intake/Output Summary (Last 24 hours) at 03/14/2021 1300 Last data filed at 03/14/2021 0206 Gross per 24 hour  Intake 30 ml  Output -  Net 30 ml   Filed Weights   03/07/21 1943  Weight: 75 kg   Body mass index  is 24.42 kg/m.   Physical Exam: General:  Average built, not in obvious distress, chronically ill, alert awake communicative, HENT:   No scleral pallor or icterus noted. Oral mucosa is moist.  Chest:   Diminished breath sounds bilaterally. No crackles or wheezes.  CVS: S1 &S2 heard. No murmur.  Regular rate and rhythm. Abdomen: Soft, nontender, nondistended.  Bowel sounds are heard.   Extremities: Status post right below-knee amputation with bandage of the stump, left upper extremity AV fistula in place. Psych: Alert, awake and communicative, slightly low in mood, CNS:  No cranial nerve deficits.  Power equal in all extremities.   Skin: Right lower extremity below-knee amputation.   Data Review: I have personally reviewed the following laboratory data and studies,  CBC: Recent  Labs  Lab 03/07/21 2017 03/08/21 1203 03/08/21 1834 03/09/21 0533 03/12/21 0151 03/13/21 0536  WBC 10.7* 11.8* 9.8 7.8 7.3 7.9  NEUTROABS 9.2* 10.1*  --   --   --   --   HGB 9.6* 10.1* 9.4* 9.1* 8.8* 8.7*  HCT 27.7* 29.4* 28.4* 26.2* 26.7* 27.8*  MCV 90.5 92.7 92.8 91.3 94.3 97.2  PLT 154 175 171 152 167 759   Basic Metabolic Panel: Recent Labs  Lab 03/08/21 1203 03/08/21 1834 03/09/21 0533 03/10/21 0100 03/12/21 0151 03/13/21 0536  NA 133* 130* 133* 134* 133* 135  K 3.0* 3.2* 3.2* 3.8 3.8 4.0  CL 93* 90* 94* 99 98 98  CO2 27 24 24 26 24 24   GLUCOSE 152* 368* 256* 198* 164* 136*  BUN 36* 41* 48* 22 54* 66*  CREATININE 6.35* 6.79* 7.25* 4.48* 7.46* 9.23*  CALCIUM 8.5* 8.5* 8.3* 8.3* 8.0* 8.3*  MG 1.7  --  1.8  --  1.7 1.9  PHOS  --   --  3.1  --   --  2.8   Liver Function Tests: Recent Labs  Lab 03/07/21 2017 03/08/21 1203 03/08/21 1834  AST 40 58* 57*  ALT 25 27 31   ALKPHOS 113 96 117  BILITOT 0.6 0.7 0.8  PROT 6.1* 6.0* 6.0*  ALBUMIN 2.9* 2.9* 2.8*   No results for input(s): LIPASE, AMYLASE in the last 168 hours. No results for input(s): AMMONIA in the last 168 hours. Cardiac  Enzymes: No results for input(s): CKTOTAL, CKMB, CKMBINDEX, TROPONINI in the last 168 hours. BNP (last 3 results) No results for input(s): BNP in the last 8760 hours.  ProBNP (last 3 results) No results for input(s): PROBNP in the last 8760 hours.  CBG: Recent Labs  Lab 03/13/21 1234 03/13/21 1553 03/13/21 2147 03/14/21 0740 03/14/21 1145  GLUCAP 217* 217* 235* 314* 292*   Recent Results (from the past 240 hour(s))  Blood Culture (routine x 2)     Status: None   Collection Time: 03/07/21  8:22 PM   Specimen: BLOOD  Result Value Ref Range Status   Specimen Description BLOOD SITE NOT SPECIFIED  Final   Special Requests   Final    BOTTLES DRAWN AEROBIC AND ANAEROBIC Blood Culture adequate volume   Culture   Final    NO GROWTH 5 DAYS Performed at [facility], [place] [address] [place], [place] [address]    Report Status 03/13/2021 FINAL  Final  Blood Culture (routine x 2)     Status: None   Collection Time: 03/07/21  8:46 PM   Specimen: BLOOD LEFT HAND  Result Value Ref Range Status   Specimen Description BLOOD LEFT HAND  Final   Special Requests   Final    BOTTLES DRAWN AEROBIC AND ANAEROBIC Blood Culture adequate volume   Culture   Final    NO GROWTH 5 DAYS Performed at [facility], [place] [address] [place], [place] [address]    Report Status 03/13/2021 FINAL  Final  Resp Panel by RT-PCR (Flu A&B, Covid) Nasopharyngeal Swab     Status: None   Collection Time: 03/08/21  2:35 AM   Specimen: Nasopharyngeal Swab; Nasopharyngeal(NP) swabs in vial transport medium  Result Value Ref Range Status   SARS Coronavirus 2 by RT PCR NEGATIVE NEGATIVE Final    Comment: (NOTE) SARS-CoV-2 target nucleic acids are NOT DETECTED.  The SARS-CoV-2 RNA is generally detectable in upper respiratory specimens during the acute phase of infection. The  lowest concentration of SARS-CoV-2 viral copies this assay can detect is 138 copies/mL. A negative result does not preclude  SARS-Cov-2 infection and should not be used as the sole basis for treatment or other patient management decisions. A negative result may occur with  improper specimen collection/handling, submission of specimen other than nasopharyngeal swab, presence of viral mutation(s) within the areas targeted by this assay, and inadequate number of viral copies(<138 copies/mL). A negative result must be combined with clinical observations, patient history, and epidemiological information. The expected result is Negative.  Fact Sheet for Patients:  EntrepreneurPulse.com.au  Fact Sheet for Healthcare Providers:  IncredibleEmployment.be  This test is no t yet approved or cleared by the [place] FDA and  has been authorized for detection and/or diagnosis of SARS-CoV-2 by FDA under an Emergency Use Authorization (EUA). This EUA will remain  in effect (meaning this test can be used) for the duration of the COVID-19 declaration under Section 564(b)(1) of the Act, 21 U.S.C.section 360bbb-3(b)(1), unless the authorization is terminated  or revoked sooner.       Influenza A by PCR NEGATIVE NEGATIVE Final   Influenza B by PCR NEGATIVE NEGATIVE Final    Comment: (NOTE) The Xpert Xpress SARS-CoV-2/FLU/RSV plus assay is intended as an aid in the diagnosis of influenza from Nasopharyngeal swab specimens and should not be used as a sole basis for treatment. Nasal washings and aspirates are unacceptable for Xpert Xpress SARS-CoV-2/FLU/RSV testing.  Fact Sheet for Patients: EntrepreneurPulse.com.au  Fact Sheet for Healthcare Providers: IncredibleEmployment.be  This test is not yet approved or cleared by the [place] FDA and has been authorized for detection and/or diagnosis of SARS-CoV-2 by FDA under an Emergency Use Authorization (EUA). This EUA will remain in effect (meaning this test can be used) for the duration of  the COVID-19 declaration under Section 564(b)(1) of the Act, 21 U.S.C. section 360bbb-3(b)(1), unless the authorization is terminated or revoked.  Performed at [facility], [place] [address] [place], [place] [address]   MRSA PCR Screening     Status: None   Collection Time: 03/08/21  6:40 PM   Specimen: Nasal Mucosa; Nasopharyngeal  Result Value Ref Range Status   MRSA by PCR NEGATIVE NEGATIVE Final    Comment:        The GeneXpert MRSA Assay (FDA approved for NASAL specimens only), is one component of a comprehensive MRSA colonization surveillance program. It is not intended to diagnose MRSA infection nor to guide or monitor treatment for MRSA infections. Performed at [facility], [place] [address], [place], [place] [address]   Gastrointestinal Panel by PCR , Stool     Status: None   Collection Time: 03/09/21  3:52 AM   Specimen: STOOL  Result Value Ref Range Status   Campylobacter species NOT DETECTED NOT DETECTED Final   Plesimonas shigelloides NOT DETECTED NOT DETECTED Final   Salmonella species NOT DETECTED NOT DETECTED Final   Yersinia enterocolitica NOT DETECTED NOT DETECTED Final   Vibrio species NOT DETECTED NOT DETECTED Final   Vibrio cholerae NOT DETECTED NOT DETECTED Final   Enteroaggregative E coli (EAEC) NOT DETECTED NOT DETECTED Final   Enteropathogenic E coli (EPEC) NOT DETECTED NOT DETECTED Final   Enterotoxigenic E coli (ETEC) NOT DETECTED NOT DETECTED Final   Shiga like toxin producing E coli (STEC) NOT DETECTED NOT DETECTED Final   Shigella/Enteroinvasive E coli (EIEC) NOT DETECTED NOT DETECTED Final   Cryptosporidium NOT DETECTED NOT DETECTED Final   Cyclospora cayetanensis  NOT DETECTED NOT DETECTED Final   Entamoeba histolytica NOT DETECTED NOT DETECTED Final   Giardia lamblia NOT DETECTED NOT DETECTED Final   Adenovirus F40/41 NOT DETECTED NOT DETECTED Final   Astrovirus NOT DETECTED NOT DETECTED Final   Norovirus GI/GII NOT DETECTED NOT  DETECTED Final   Rotavirus A NOT DETECTED NOT DETECTED Final   Sapovirus (I, II, IV, and V) NOT DETECTED NOT DETECTED Final    Comment: Performed at [facility], [address], [place], [place] [address]  C Difficile Quick Screen w PCR reflex     Status: None   Collection Time: 03/09/21  3:52 AM   Specimen: STOOL  Result Value Ref Range Status   C Diff antigen NEGATIVE NEGATIVE Final   C Diff toxin NEGATIVE NEGATIVE Final   C Diff interpretation No C. difficile detected.  Final    Comment: Performed at [facility], [place] [address], [place], [place] [address]     Studies: No results found.    Flora Lipps, MD  [facility] 03/14/2021  If 7PM-7AM, please contact night-coverage

## 2021-03-14 NOTE — Progress Notes (Addendum)
  Progress Note    03/14/2021 8:30 AM 1 Day Post-Op  Subjective:  Frustrated with post op pain   Vitals:   03/14/21 0440 03/14/21 0742  BP:  110/69  Pulse:  86  Resp: 11 18  Temp:  98.1 F (36.7 C)  SpO2: 97% 99%   Physical Exam Lungs:  Non labored Incisions:  R BKA dressing left in place Neurologic: A&O  CBC    Component Value Date/Time   WBC 7.9 03/13/2021 0536   RBC 2.86 (L) 03/13/2021 0536   HGB 8.7 (L) 03/13/2021 0536   HGB 9.5 (L) 06/26/2020 1527   HCT 27.8 (L) 03/13/2021 0536   HCT 27.6 (L) 06/26/2020 1527   PLT 209 03/13/2021 0536   PLT 203 06/26/2020 1527   MCV 97.2 03/13/2021 0536   MCV 93 06/26/2020 1527   MCH 30.4 03/13/2021 0536   MCHC 31.3 03/13/2021 0536   RDW 13.2 03/13/2021 0536   RDW 11.9 06/26/2020 1527   LYMPHSABS 0.8 03/08/2021 1203   LYMPHSABS 1.0 06/26/2020 1527   MONOABS 0.8 03/08/2021 1203   EOSABS 0.0 03/08/2021 1203   EOSABS 0.2 06/26/2020 1527   BASOSABS 0.0 03/08/2021 1203   BASOSABS 0.0 06/26/2020 1527    BMET    Component Value Date/Time   NA 135 03/13/2021 0536   K 4.0 03/13/2021 0536   CL 98 03/13/2021 0536   CO2 24 03/13/2021 0536   GLUCOSE 136 (H) 03/13/2021 0536   BUN 66 (H) 03/13/2021 0536   CREATININE 9.23 (H) 03/13/2021 0536   CREATININE 2.22 (H) 09/07/2013 0845   CALCIUM 8.3 (L) 03/13/2021 0536   GFRNONAA 6 (L) 03/13/2021 0536   GFRAA 9 (L) 07/31/2020 2100    INR    Component Value Date/Time   INR 1.4 (H) 03/08/2021 1203     Intake/Output Summary (Last 24 hours) at 03/14/2021 0830 Last data filed at 03/14/2021 0206 Gross per 24 hour  Intake 380 ml  Output 25 ml  Net 355 ml     Assessment/Plan:  65 y.o. male is s/p R BKA 1 Day Post-Op   R BKA dressing without breakthrough bleeding; dressing change tomorrow Continue PCA dilaudid today; begin to wean tomorrow PT/OT/CIR eval pending   Dagoberto Ligas, PA-C [facility] [phone] 03/14/2021 8:30 AM  I have independently  interviewed and examined patient and agree with PA assessment and plan above.  He is generally happy today although his pain does appear better controlled.  I discussed with him the plan to take the dressing down tomorrow and then evaluate for possible rehab which he has been recommended for by PT.   Ok to [employer] abx and continue plavix.  Ayme Short C. Donzetta Matters, MD [facility] [place] Office: [phone] Pager: [phone]

## 2021-03-14 NOTE — Progress Notes (Signed)
Inpatient Diabetes Program Recommendations  AACE/ADA: New Consensus Statement on Inpatient Glycemic Control   Target Ranges:  Prepandial:   less than 140 mg/dL      Peak postprandial:   less than 180 mg/dL (1-2 hours)      Critically ill patients:  140 - 180 mg/dL     Review of Glycemic Control Results for Duane Hall, Duane Hall" (MRN [id number]) as of 03/14/2021 08:51  Ref. Range 03/13/2021 08:10 03/13/2021 10:12 03/13/2021 12:34 03/13/2021 15:53 03/13/2021 21:47 03/14/2021 07:40  Glucose-Capillary Latest Ref Range: 70 - 99 mg/dL 162 (H) 184 (H) 217 (H) 217 (H) 235 (H) 314 (H)   Diabetes history: DM Outpatient Diabetes medications: OmniPod insulin pump Pump settings in January, 2022:  Basal: 12A 0.5 units/hr, 2P 2.4 units/hr, 8P 0.5 units/hr  CHO ratio: 1:16  ISF 100  Target BS: 100 with correction over 120   Current orders for Inpatient glycemic control:  Lantus 10 units Daily Novolog 0-6 units TID with meals + hs Novolog 3 units tid meal coverage Breeze tid between meals  Inpatient Diabetes Program Recommendations:    Based on insulin pump settings pt gets a total of 23.4 units of basal insulin in a 24 hour period  -  Increase Lantus to 14 units -  Increase Novolog meal coverage to 5 units tid  Thanks, Tama Headings RN, MSN, BC-ADM Inpatient Diabetes Coordinator Team Pager [phone] (8a-5p)

## 2021-03-14 NOTE — Progress Notes (Signed)
Inpatient Rehab Admissions Coordinator Note:   Per therapy recommendations, pt was screened for CIR candidacy by Shann Medal, PT, DPT.  At this time we are recommending a CIR consult and I will place an order per our protocol.  Please contact me with questions.   Shann Medal, PT, DPT [phone] 03/14/21 3:30 PM

## 2021-03-14 NOTE — Progress Notes (Signed)
Patient expressed to nurse that his prn imodium 2mg  that he has been taking for the last few days is not helping with his loose stool. Patient is upset and requested that Nurse call doctor to come to bedside. This nurse explained to patient that doctors usually round on dayshift and he can express any concerns at that time. Patient stated that he also wanted his Imodium to be increased to 4mg  and believes this would help stop/decrease his loose stools better than 2mg . This nurse paged oncall [facility] [place]  0600- Md placed order for Imodium to be increased  To now 4mg  q6. Patient just recently received 2mg  of imodium at [facility]. This nurse will let on coming nurse administer new dosage of imodium.

## 2021-03-14 NOTE — Evaluation (Addendum)
Physical Therapy Re-Evaluation Patient Details Name: Duane Hall MRN: [id number] DOB: 1956-10-05 Today's Date: 03/14/2021   History of Present Illness  Pt is a 65 y.o. M admitted with severe sepsis secondary to right foot gangrene. MRI showing subacute mildly impacted fractures of second and third metatarsal necks. 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 Significant PMH: ESRD on hemodialysis, PVD, DM2, recent right first toe amputation 1/14.  Clinical Impression  Pt re-evaluated s/p right BKA. Continued education required regarding PT/OT role, plan of care, mobility expectations, and discharge recommendations. Pt requiring two person minimal assist for stand pivot transfer using a walker from bed to chair. PCA encouraged as needed for pain control and management. Education provided regarding residual limb positioning; pt too painful to participate in amputee exercises today. Suspect steady progress given age, PLOF, and family support. Recommending CIR to maximize functional independence and decrease caregiver burden.     Follow Up Recommendations CIR    Equipment Recommendations  3in1 (PT)    Recommendations for Other Services       Precautions / Restrictions Precautions Precautions: Fall Restrictions Weight Bearing Restrictions: Yes RLE Weight Bearing: Non weight bearing      Mobility  Bed Mobility Overal bed mobility: Needs Assistance Bed Mobility: Supine to Sit     Supine to sit: Min assist     General bed mobility comments: MinA with use of bed pad to guide hips to edge of bed. Cues for use of rail. Increased time/effort    Transfers Overall transfer level: Needs assistance   Transfers: Sit to/from Stand;Stand Pivot Transfers Sit to Stand: Min assist;+2 physical assistance Stand pivot transfers: Min assist;+2 physical assistance       General transfer comment: MinA + 2 to rise from edge of bed, max cues for hand placement, and pivoting towards left. Cues for  sequencing/direction.  Ambulation/Gait                Stairs            Wheelchair Mobility    Modified Rankin (Stroke Patients Only)       Balance Overall balance assessment: Needs assistance   Sitting balance-Leahy Scale: Good       Standing balance-Leahy Scale: Poor Standing balance comment: reliant on hand support                             Pertinent Vitals/Pain Pain Assessment: Faces Faces Pain Scale: Hurts even more Pain Location: R residual limb Pain Descriptors / Indicators: Discomfort;Grimacing;Guarding Pain Intervention(s): Limited activity within patient's tolerance;Monitored during session;PCA encouraged    Home Living Family/patient expects to be discharged to:: Private residence Living Arrangements: Spouse/significant other Available Help at Discharge: Family Type of Home: House Home Access: Stairs to enter Entrance Stairs-Rails: Psychiatric nurse of Steps: 7 Home Layout: One level Home Equipment: Civil engineer, contracting;Wheelchair - [employer] - 2 wheels;Walker - 4 wheels (pt states his walker "has a seat" - need clarification that he has a RW)      Prior Function Level of Independence: Needs assistance   Gait / Transfers Assistance Needed: uses w/c since 1/14 amputation, modI for transfer  ADL's / Homemaking Assistance Needed: independent  Comments: was independent prior to toe amputation, including driving     Hand Dominance   Dominant Hand: Right    Extremity/Trunk Assessment   Upper Extremity Assessment Upper Extremity Assessment: Defer to OT evaluation    Lower Extremity Assessment Lower Extremity Assessment: RLE deficits/detail;LLE  deficits/detail RLE Deficits / Details: S/p amputation. Difficult to assess due to pain, right knee extension to neutral LLE Deficits / Details: Grossly 3/5    Cervical / Trunk Assessment Cervical / Trunk Assessment: Other exceptions (hx of back pain)  Communication    Communication: No difficulties  Cognition Arousal/Alertness: Suspect due to medications Behavior During Therapy: Flat affect Overall Cognitive Status: Impaired/Different from baseline Area of Impairment: Memory;Safety/judgement                     Memory: Decreased short-term memory   Safety/Judgement: Decreased awareness of safety;Decreased awareness of deficits     General Comments: STM deficits, likely due to pain medication i.e. pt stating, "I thought we did this yesterday." Pt likes to know exactly what is going on/plan for therapy which is likely baseline. Very particular and does not seem to understand various roles and their duties i.e. pt stating, "they told me I couldn't get out of bed," despite PT education that it is safe to do so with therapy team present.Also decreased insight into safety/deficits, asking if he could get up out of the chair by himself.      General Comments      Exercises     Assessment/Plan    PT Assessment Patient needs continued PT services  PT Problem List Decreased strength;Decreased activity tolerance;Decreased balance;Decreased mobility;Pain       PT Treatment Interventions DME instruction;Gait training;Functional mobility training;Therapeutic activities;Therapeutic exercise;Balance training;Stair training;Patient/family education    PT Goals (Current goals can be found in the Care Plan section)  Acute Rehab PT Goals Patient Stated Goal: to get rehab before going home PT Goal Formulation: With patient Time For Goal Achievement: 03/28/21 Potential to Achieve Goals: Good    Frequency Min 3X/week   Barriers to discharge        Co-evaluation PT/OT/SLP Co-Evaluation/Treatment: Yes Reason for Co-Treatment: For patient/therapist safety;To address functional/ADL transfers PT goals addressed during session: Mobility/safety with mobility         AM-PAC PT "6 Clicks" Mobility  Outcome Measure Help needed turning from your back  to your side while in a flat bed without using bedrails?: None Help needed moving from lying on your back to sitting on the side of a flat bed without using bedrails?: A Little Help needed moving to and from a bed to a chair (including a wheelchair)?: A Little Help needed standing up from a chair using your arms (e.g., wheelchair or bedside chair)?: A Little Help needed to walk in hospital room?: A Lot Help needed climbing 3-5 steps with a railing? : Total 6 Click Score: 16    End of Session Equipment Utilized During Treatment: Gait belt Activity Tolerance: Patient tolerated treatment well Patient left: in chair;with call bell/phone within reach;with chair alarm set Nurse Communication: Mobility status PT Visit Diagnosis: Unsteadiness on feet (R26.81);Muscle weakness (generalized) (M62.81);Difficulty in walking, not elsewhere classified (R26.2)    Time: 2836-6294 PT Time Calculation (min) (ACUTE ONLY): 29 min   Charges:   PT Evaluation $PT Re-evaluation: 1 Re-eval          Wyona Almas, PT, DPT Acute Rehabilitation Services Pager [phone] Office [phone]   Deno Etienne 03/14/2021, 10:38 AM

## 2021-03-14 NOTE — Progress Notes (Signed)
Occupational Therapy Re-Evaluation Patient Details Name: Duane Hall MRN: [id number] DOB: [birth year] Today's Date: 03/14/2021    History of Present Illness Pt is a 65 y.o. M admitted with severe sepsis secondary to right foot gangrene. MRI showing subacute mildly impacted fractures of second and third metatarsal necks. 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 Significant PMH: ESRD on hemodialysis, PVD, DM2, recent right first toe amputation 1/14.   Clinical Impression   Pt seen for OT re-evaluation now s/p R BKA yesterday. Pt currently able to complete bed mobility with up to min A, up to +2 min A to stand and take pivotal steps with rw to sit up in the recliner for a bit. Currently +2 mod A with LB ADLs, setup to min A with UB ADLs. D/c plan remains appropriate.     Follow Up Recommendations  CIR    Equipment Recommendations  3 in 1 bedside commode    Recommendations for Other Services Rehab consult     Precautions / Restrictions Precautions Precautions: Fall Restrictions Weight Bearing Restrictions: Yes RLE Weight Bearing: Non weight bearing      Mobility Bed Mobility Overal bed mobility: Needs Assistance Bed Mobility: Supine to Sit     Supine to sit: Min assist     General bed mobility comments: MinA with use of bed pad to guide hips to edge of bed. Cues for use of rail. Increased time/effort    Transfers Overall transfer level: Needs assistance Equipment used: Rolling walker (2 wheeled) Transfers: Sit to/from [facility] Sit to Stand: Min assist;+2 physical assistance Stand pivot transfers: Min assist;+2 physical assistance       General transfer comment: MinA + 2 to rise from edge of bed, max cues for hand placement, and pivoting towards left. Cues for sequencing/direction.    Balance Overall balance assessment: Needs assistance Sitting-balance support: Feet supported Sitting balance-Leahy Scale: Good     Standing balance support: Bilateral upper  extremity supported Standing balance-Leahy Scale: Poor Standing balance comment: reliant on hand support                           ADL either performed or assessed with clinical judgement   ADL Overall ADL's : Needs assistance/impaired Eating/Feeding: Set up;Sitting   Grooming: Set up;Sitting   Upper Body Bathing: Set up;Minimal assistance;Sitting   Lower Body Bathing: Moderate assistance;+2 for physical assistance;Sit to/from stand   Upper Body Dressing : Minimal assistance;Sitting   Lower Body Dressing: Moderate assistance;+2 for physical assistance   Toilet Transfer: Minimal assistance;+2 for physical assistance;Stand-pivot;BSC;RW   Toileting- Clothing Manipulation and Hygiene: Moderate assistance;+2 for physical assistance;Sit to/from stand         General ADL Comments: Pt completed bed mobility, sat EOB a few minutes, then SPT to recliner to sit up for a bit. +2 assist needed this session during OOB transfers.     Vision Baseline Vision/History: Wears glasses       Perception     Praxis      Pertinent Vitals/Pain Pain Assessment: Faces Faces Pain Scale: Hurts even more Pain Location: R residual limb Pain Descriptors / Indicators: Discomfort;Grimacing;Guarding Pain Intervention(s): Limited activity within patient's tolerance;Monitored during session;PCA encouraged     Hand Dominance Right   Extremity/Trunk Assessment Upper Extremity Assessment Upper Extremity Assessment: Generalized weakness   Lower Extremity Assessment Lower Extremity Assessment: Defer to PT evaluation RLE Deficits / Details: S/p amputation. Difficult to assess due to pain, right knee extension to neutral LLE  Deficits / Details: Grossly 3/5   Cervical / Trunk Assessment Cervical / Trunk Assessment: Other exceptions (hx of back pain)   Communication Communication Communication: No difficulties   Cognition Arousal/Alertness: Suspect due to medications Behavior During  Therapy: Flat affect Overall Cognitive Status: Impaired/Different from baseline Area of Impairment: Memory;Safety/judgement                     Memory: Decreased short-term memory   Safety/Judgement: Decreased awareness of safety;Decreased awareness of deficits     General Comments: STM deficits, likely due to pain medication i.e. pt stating, "I thought we did this yesterday." Pt likes to know exactly what is going on/plan for therapy which is likely baseline. Very particular and does not seem to understand various roles and their duties i.e. pt stating, "they told me I couldn't get out of bed," despite PT education that it is safe to do so with therapy team present.Also decreased insight into safety/deficits, asking if he could get up out of the chair by himself.   General Comments       Exercises     Shoulder Instructions      Home Living Family/patient expects to be discharged to:: Private residence Living Arrangements: Spouse/significant other Available Help at Discharge: Family Type of Home: House Home Access: Stairs to enter Technical brewer of Steps: 7 Entrance Stairs-Rails: Right;Left Home Layout: One level     Bathroom Shower/Tub: Occupational psychologist: Standard Bathroom Accessibility: Yes   Home Equipment: Shower seat;Wheelchair - [employer] - 2 wheels;Walker - 4 wheels (pt states his walker "has a seat" - need clarification that he has a RW)          Prior Functioning/Environment Level of Independence: Needs assistance  Gait / Transfers Assistance Needed: uses w/c since 1/14 amputation, modI for transfer ADL's / Homemaking Assistance Needed: independent   Comments: was independent prior to toe amputation, including driving        OT Problem List: Decreased strength;Impaired balance (sitting and/or standing);Decreased safety awareness;Decreased knowledge of use of DME or AE      OT Treatment/Interventions: Self-care/ADL  training;Therapeutic exercise;Neuromuscular education;Energy conservation;DME and/or AE instruction;Therapeutic activities;Patient/family education;Balance training    OT Goals(Current goals can be found in the care plan section) Acute Rehab OT Goals Patient Stated Goal: to get rehab before going home OT Goal Formulation: With patient Time For Goal Achievement: 03/26/21 Potential to Achieve Goals: Good  OT Frequency: Min 2X/week   Barriers to D/C:            Co-evaluation PT/OT/SLP Co-Evaluation/Treatment: Yes Reason for Co-Treatment: For patient/therapist safety;To address functional/ADL transfers PT goals addressed during session: Mobility/safety with mobility OT goals addressed during session: ADL's and self-care      AM-PAC OT "6 Clicks" Daily Activity     Outcome Measure Help from another person eating meals?: None Help from another person taking care of personal grooming?: A Little Help from another person toileting, which includes using toliet, bedpan, or urinal?: A Little Help from another person bathing (including washing, rinsing, drying)?: A Lot Help from another person to put on and taking off regular upper body clothing?: A Little Help from another person to put on and taking off regular lower body clothing?: A Lot 6 Click Score: 17   End of Session Equipment Utilized During Treatment: Gait belt;Rolling walker Nurse Communication: Mobility status  Activity Tolerance: Patient tolerated treatment well;Patient limited by pain Patient left: in chair;with call bell/phone within reach;with chair alarm  set  OT Visit Diagnosis: Unsteadiness on feet (R26.81);Other abnormalities of gait and mobility (R26.89);Muscle weakness (generalized) (M62.81)                Time: 4136-4383 OT Time Calculation (min): 31 min Charges:  OT General Charges $OT Visit: 1 Visit OT Evaluation $OT Re-eval: 1 Re-eval  Tyrone Schimke, OT Acute Rehabilitation Services Pager:  [phone] Office: [phone]   Hortencia Pilar 03/14/2021, 1:06 PM

## 2021-03-15 LAB — GLUCOSE, CAPILLARY
Glucose-Capillary: 209 mg/dL — ABNORMAL HIGH (ref 70–99)
Glucose-Capillary: 92 mg/dL (ref 70–99)
Glucose-Capillary: 220 mg/dL — ABNORMAL HIGH (ref 70–99)
Glucose-Capillary: 142 mg/dL — ABNORMAL HIGH (ref 70–99)

## 2021-03-15 LAB — CBC
HCT: 28.7 % — ABNORMAL LOW (ref 39.0–52.0)
Hemoglobin: 9.2 g/dL — ABNORMAL LOW (ref 13.0–17.0)
MCH: 31 pg (ref 26.0–34.0)
MCHC: 32.1 g/dL (ref 30.0–36.0)
MCV: 96.6 fL (ref 80.0–100.0)
Platelets: 208 10*3/uL (ref 150–400)
RBC: 2.97 MIL/uL — ABNORMAL LOW (ref 4.22–5.81)
RDW: 13.3 % (ref 11.5–15.5)
WBC: 6 10*3/uL (ref 4.0–10.5)
nRBC: 0 % (ref 0.0–0.2)

## 2021-03-15 LAB — MAGNESIUM: Magnesium: 1.7 mg/dL (ref 1.7–2.4)

## 2021-03-15 LAB — BASIC METABOLIC PANEL
Anion gap: 8 (ref 5–15)
BUN: 18 mg/dL (ref 8–23)
CO2: 27 mmol/L (ref 22–32)
Calcium: 7.7 mg/dL — ABNORMAL LOW (ref 8.9–10.3)
Chloride: 102 mmol/L (ref 98–111)
Creatinine, Ser: 3.65 mg/dL — ABNORMAL HIGH (ref 0.61–1.24)
GFR, Estimated: 18 mL/min — ABNORMAL LOW (ref >60)
Glucose, Bld: 241 mg/dL — ABNORMAL HIGH (ref 70–99)
Potassium: 3.9 mmol/L (ref 3.5–5.1)
Sodium: 137 mmol/L (ref 135–145)

## 2021-03-15 LAB — PHOSPHORUS: Phosphorus: 2.6 mg/dL (ref 2.5–4.6)

## 2021-03-15 LAB — IRON AND TIBC
Iron: 36 ug/dL — ABNORMAL LOW (ref 45–182)
Saturation Ratios: 33 % (ref 17.9–39.5)
TIBC: 109 ug/dL — ABNORMAL LOW (ref 250–450)
UIBC: 73 ug/dL

## 2021-03-15 MED ORDER — OXYCODONE HCL 5 MG PO TABS
10.0000 mg | ORAL_TABLET | ORAL | Status: DC | PRN
  Administered 2021-03-15 – 2021-03-16 (×3): 10 mg via ORAL
  Filled 2021-03-15 (×3): qty 2

## 2021-03-15 MED ORDER — CHLORHEXIDINE GLUCONATE CLOTH 2 % EX PADS
6.0000 | MEDICATED_PAD | Freq: Every day | CUTANEOUS | Status: DC
  Administered 2021-03-15 – 2021-03-16 (×2): 6 via TOPICAL

## 2021-03-15 NOTE — Progress Notes (Signed)
[facility] ROUNDING NOTE   Subjective:   Brief history: Is a 65-year-old gentleman with history end-stage renal disease, type 2 diabetes peripheral vascular disease status post amputation 01/12/2021.  Presents with septic syndrome and redness over amputation site.  Status post below-knee amputation 03/13/2021.  Rehab consulted for CIR  Patient underwent dialysis 03/14/2021.  Do not see any documented ultrafiltration  Blood pressure 120/59 pulse 80 temperature 98.4 O2 sats 99% nasal cannula  Sodium 137 potassium 3.9 chloride 102 CO2 27 BUN 18 creatinine 3.75 glucose 241 calcium 7.7 phosphorus 2.6 magnesium 1.7 hemoglobin 9.2   last albumin was 2.8 03/08/2021    Objective:  Vital signs in last 24 hours:  Temp:  [98 F (36.7 C)-98.5 F (36.9 C)] 98.4 F (36.9 C) (03/17 0353) Pulse Rate:  [78-87] 78 (03/16 2351) Resp:  [10-19] 15 (03/17 0505) BP: (80-125)/(44-69) 101/63 (03/16 2351) SpO2:  [97 %-100 %] 99 % (03/17 0000) FiO2 (%):  [0 %] 0 % (03/16 1247) Weight:  [74.4 kg] 74.4 kg (03/16 1522)  Weight change:  Filed Weights   03/07/21 1943 03/14/21 1522  Weight: 75 kg 74.4 kg    Intake/Output: I/O last 3 completed shifts: In: 380 [I.V.:280; IV Piggyback:100] Out: 25 [Blood:25]   Intake/Output this shift:  Total I/O In: -  Out: -40     alert, nad , chronic ill appearing  no jvd  Chest cta bilat  Cor reg no RG  Abd soft ntnd no ascites   Ext no LE edema, R foot wrapped   Alert, NF, ox3   L AVF +bruit   Basic Metabolic Panel: Recent Labs  Lab 03/08/21 1203 03/08/21 1834 03/09/21 0533 03/10/21 0100 03/12/21 0151 03/13/21 0536 03/15/21 0249  NA 133*   < > 133* 134* 133* 135 137  K 3.0*   < > 3.2* 3.8 3.8 4.0 3.9  CL 93*   < > 94* 99 98 98 102  CO2 27   < > 24 26 24 24 27   GLUCOSE 152*   < > 256* 198* 164* 136* 241*  BUN 36*   < > 48* 22 54* 66* 18  CREATININE 6.35*   < > 7.25* 4.48* 7.46* 9.23* 3.65*  CALCIUM 8.5*   < > 8.3* 8.3* 8.0* 8.3* 7.7*   MG 1.7  --  1.8  --  1.7 1.9 1.7  PHOS  --   --  3.1  --   --  2.8 2.6   < > = values in this interval not displayed.    Liver Function Tests: Recent Labs  Lab 03/08/21 1203 03/08/21 1834  AST 58* 57*  ALT 27 31  ALKPHOS 96 117  BILITOT 0.7 0.8  PROT 6.0* 6.0*  ALBUMIN 2.9* 2.8*   No results for input(s): LIPASE, AMYLASE in the last 168 hours. No results for input(s): AMMONIA in the last 168 hours.  CBC: Recent Labs  Lab 03/08/21 1203 03/08/21 1834 03/09/21 0533 03/12/21 0151 03/13/21 0536 03/15/21 0249  WBC 11.8* 9.8 7.8 7.3 7.9 6.0  NEUTROABS 10.1*  --   --   --   --   --   HGB 10.1* 9.4* 9.1* 8.8* 8.7* 9.2*  HCT 29.4* 28.4* 26.2* 26.7* 27.8* 28.7*  MCV 92.7 92.8 91.3 94.3 97.2 96.6  PLT 175 171 152 167 209 208    Cardiac Enzymes: No results for input(s): CKTOTAL, CKMB, CKMBINDEX, TROPONINI in the last 168 hours.  BNP: Invalid input(s): POCBNP  CBG: Recent Labs  Lab  03/13/21 1553 03/13/21 2147 03/14/21 0740 03/14/21 1145 03/14/21 2143  GLUCAP 217* 235* 314* 292* 174*    Microbiology: Results for orders placed or performed during the hospital encounter of 03/07/21  Blood Culture (routine x 2)     Status: None   Collection Time: 03/07/21  8:22 PM   Specimen: BLOOD  Result Value Ref Range Status   Specimen Description BLOOD SITE NOT SPECIFIED  Final   Special Requests   Final    BOTTLES DRAWN AEROBIC AND ANAEROBIC Blood Culture adequate volume   Culture   Final    NO GROWTH 5 DAYS Performed at [facility], [place] [address], [place], [place] [address]    Report Status 03/13/2021 FINAL  Final  Blood Culture (routine x 2)     Status: None   Collection Time: 03/07/21  8:46 PM   Specimen: BLOOD LEFT HAND  Result Value Ref Range Status   Specimen Description BLOOD LEFT HAND  Final   Special Requests   Final    BOTTLES DRAWN AEROBIC AND ANAEROBIC Blood Culture adequate volume   Culture   Final    NO GROWTH 5 DAYS Performed at [facility], [place] [address], [place], [place] [address]    Report Status 03/13/2021 FINAL  Final  Resp Panel by RT-PCR (Flu A&B, Covid) Nasopharyngeal Swab     Status: None   Collection Time: 03/08/21  2:35 AM   Specimen: Nasopharyngeal Swab; Nasopharyngeal(NP) swabs in vial transport medium  Result Value Ref Range Status   SARS Coronavirus 2 by RT PCR NEGATIVE NEGATIVE Final    Comment: (NOTE) SARS-CoV-2 target nucleic acids are NOT DETECTED.  The SARS-CoV-2 RNA is generally detectable in upper respiratory specimens during the acute phase of infection. The lowest concentration of SARS-CoV-2 viral copies this assay can detect is 138 copies/mL. A negative result does not preclude SARS-Cov-2 infection and should not be used as the sole basis for treatment or other patient management decisions. A negative result may occur with  improper specimen collection/handling, submission of specimen other than nasopharyngeal swab, presence of viral mutation(s) within the areas targeted by this assay, and inadequate number of viral copies(<138 copies/mL). A negative result must be combined with clinical observations, patient history, and epidemiological information. The expected result is Negative.  Fact Sheet for Patients:  EntrepreneurPulse.com.au  Fact Sheet for Healthcare Providers:  IncredibleEmployment.be  This test is no t yet approved or cleared by the [place] FDA and  has been authorized for detection and/or diagnosis of SARS-CoV-2 by FDA under an Emergency Use Authorization (EUA). This EUA will remain  in effect (meaning this test can be used) for the duration of the COVID-19 declaration under Section 564(b)(1) of the Act, 21 U.S.C.section 360bbb-3(b)(1), unless the authorization is terminated  or revoked sooner.       Influenza A by PCR NEGATIVE NEGATIVE Final   Influenza B by PCR NEGATIVE NEGATIVE Final    Comment: (NOTE) The Xpert Xpress  SARS-CoV-2/FLU/RSV plus assay is intended as an aid in the diagnosis of influenza from Nasopharyngeal swab specimens and should not be used as a sole basis for treatment. Nasal washings and aspirates are unacceptable for Xpert Xpress SARS-CoV-2/FLU/RSV testing.  Fact Sheet for Patients: EntrepreneurPulse.com.au  Fact Sheet for Healthcare Providers: IncredibleEmployment.be  This test is not yet approved or cleared by the [place] FDA and has been authorized for detection and/or diagnosis of SARS-CoV-2 by FDA under an Emergency Use Authorization (EUA). This EUA  will remain in effect (meaning this test can be used) for the duration of the COVID-19 declaration under Section 564(b)(1) of the Act, 21 U.S.C. section 360bbb-3(b)(1), unless the authorization is terminated or revoked.  Performed at [facility], [place] [address] [place], [place] [address]   MRSA PCR Screening     Status: None   Collection Time: 03/08/21  6:40 PM   Specimen: Nasal Mucosa; Nasopharyngeal  Result Value Ref Range Status   MRSA by PCR NEGATIVE NEGATIVE Final    Comment:        The GeneXpert MRSA Assay (FDA approved for NASAL specimens only), is one component of a comprehensive MRSA colonization surveillance program. It is not intended to diagnose MRSA infection nor to guide or monitor treatment for MRSA infections. Performed at [facility], [place] [address], [place], [place] [address]   Gastrointestinal Panel by PCR , Stool     Status: None   Collection Time: 03/09/21  3:52 AM   Specimen: STOOL  Result Value Ref Range Status   Campylobacter species NOT DETECTED NOT DETECTED Final   Plesimonas shigelloides NOT DETECTED NOT DETECTED Final   Salmonella species NOT DETECTED NOT DETECTED Final   Yersinia enterocolitica NOT DETECTED NOT DETECTED Final   Vibrio species NOT DETECTED NOT DETECTED Final   Vibrio cholerae NOT DETECTED NOT DETECTED Final    Enteroaggregative E coli (EAEC) NOT DETECTED NOT DETECTED Final   Enteropathogenic E coli (EPEC) NOT DETECTED NOT DETECTED Final   Enterotoxigenic E coli (ETEC) NOT DETECTED NOT DETECTED Final   Shiga like toxin producing E coli (STEC) NOT DETECTED NOT DETECTED Final   Shigella/Enteroinvasive E coli (EIEC) NOT DETECTED NOT DETECTED Final   Cryptosporidium NOT DETECTED NOT DETECTED Final   Cyclospora cayetanensis NOT DETECTED NOT DETECTED Final   Entamoeba histolytica NOT DETECTED NOT DETECTED Final   Giardia lamblia NOT DETECTED NOT DETECTED Final   Adenovirus F40/41 NOT DETECTED NOT DETECTED Final   Astrovirus NOT DETECTED NOT DETECTED Final   Norovirus GI/GII NOT DETECTED NOT DETECTED Final   Rotavirus A NOT DETECTED NOT DETECTED Final   Sapovirus (I, II, IV, and V) NOT DETECTED NOT DETECTED Final    Comment: Performed at [facility], [place] [address] [place], [place] [address]  C Difficile Quick Screen w PCR reflex     Status: None   Collection Time: 03/09/21  3:52 AM   Specimen: STOOL  Result Value Ref Range Status   C Diff antigen NEGATIVE NEGATIVE Final   C Diff toxin NEGATIVE NEGATIVE Final   C Diff interpretation No C. difficile detected.  Final    Comment: Performed at [facility], [place] [address] [place], [place] [address]    Coagulation Studies: No results for input(s): LABPROT, INR in the last 72 hours.  Urinalysis: No results for input(s): COLORURINE, LABSPEC, PHURINE, GLUCOSEU, HGBUR, BILIRUBINUR, KETONESUR, PROTEINUR, UROBILINOGEN, NITRITE, LEUKOCYTESUR in the last 72 hours.  Invalid input(s): APPERANCEUR    Imaging: No results found.   Medications:    . acidophilus  2 capsule Oral Daily  . aspirin  81 mg Oral Daily  . Chlorhexidine Gluconate Cloth  6 each Topical Daily  . Chlorhexidine Gluconate Cloth  6 each Topical Q0600  . clopidogrel  75 mg Oral Daily  . [START ON 03/20/2021] darbepoetin (ARANESP) injection - DIALYSIS  60 mcg  Intravenous Q Tue-HD  . feeding supplement  1 Container Oral TID BM  . gabapentin  100 mg Oral  Daily  . heparin  5,000 Units Subcutaneous Q8H  . HYDROmorphone   Intravenous Q4H  . insulin aspart  0-15 Units Subcutaneous TID WC  . insulin aspart  0-5 Units Subcutaneous QHS  . insulin aspart  3 Units Subcutaneous TID WC  . insulin glargine  10 Units Subcutaneous Daily  . multivitamin  1 tablet Oral QHS  . pantoprazole  40 mg Oral Daily  . rosuvastatin  10 mg Oral q morning   acetaminophen **OR** acetaminophen, diphenhydrAMINE **OR** diphenhydrAMINE, loperamide, naloxone **AND** sodium chloride flush, ondansetron **OR** ondansetron (ZOFRAN) IV, prochlorperazine  Assessment/ Plan:  OP HD: [facility] home therapies. 4 HD per week, MTuTh Fri. 76kg edw Hep 3000   Assessment/ Plan: 1. Sepsis D/T Diabetic foot wound- MRI +for osteo R foot. IV abx w/ improving BP's and MS. On Vanc/ meropenem.   Status post BKA 03/14/2021 2. Hypokalemia-resolved  3. ESRD - Home hemo patient.    Next HD 03/16/2021. Plan 3 HD per week while here , 3.5 - 4h as needed.  4. Hypotension/ volume: no vol excess, under dry by 1kg. Keep even next HD 5. Anemia -   We will add ESA.  Darbepoetin 60 mcg weekly.  We will check iron saturation 6. Metabolic bone disease - On Auryxia binders. No VDRA on medication list from [facility]. 7. Nutrition - Albumin low. Changed to renal/carb mod diet with fluid restrictions. Add prosource and renal vits. 8. DMT2-per primary 9. H/O Hep C 10. H/O COPD, tobacco use.  11. H/O PAD onclopidogrel.    LOS: Lee @TODAY @6 :57 AM

## 2021-03-15 NOTE — Progress Notes (Signed)
PROGRESS NOTE  WHEELER INCORVAIA OHY:[id number] DOB: 1956/03/26 DOA: 03/07/2021 PCP: Pieter Partridge, PA   LOS: 8 days   Brief narrative:  Duane Hall is a 65 years old male with history of end-stage renal disease on hemodialysis, type 2 diabetes, peripheral vascular disease who had undergone amputation on 01/12/21 due to gangrene, being followed by wound care as outpatient, presented to the hospital with increasing redness and dryness at the amputation site.  Patient had chills dizziness, sweatiness during hemodialysis session and was noted to be febrile and confused so he was sent into the hospital.  In the ED, patient was noted to be hypotensive, tachycardic with a fever of 103F.  He was then admitted to the ICU for further evaluation and treatment secondary to severe sepsis.  Patient was subsequently transferred out of the ICU.  Seen by vascular surgery and patient underwent below-knee amputation on the right side on 03/13/2021.  Postop patient was put on Dilaudid pump for adequate pain management.  At this time patient is considering CIR  Assessment/Plan:  Principal Problem:   Sepsis ([facility]) Active Problems:   Anemia of chronic disease   Hypokalemia   Diabetic foot ulcer (H[facility])   Benign prostatic hyperplasia with weak urinary stream   ESRD (end stage renal disease) ([facility])   Uncontrolled type 2 diabetes mellitus with hyperglycemia ([facility])   Osteomyelitis of right foot (HCC)   Gangrene (HCC)  Severe sepsis secondary to right foot gangrene/osteomyelitis Right foot On vancomycin and meropenem.  Patient underwent below-knee amputation with vascular surgery on 03/13/2021.Marland Kitchen  Continue to hold the Plavix.  Antibiotic recommendations/Plavix as per vascular surgery.  ESRD on hemodialysis at home. Hypotensive with 4 x weekly HD at home. Nephrology on board for hemodialysis.  Peripheral Vascular Disease Patient with recent balloon angioplasty and atherectomy 12/27 of right SFA and posterior tibial  artery.  Vascular surgery on board.  Continue aspirin and statin.    Diarrhea  Likely antibiotic associated.  Has improved.  On Imodium.  CT was negative.  Continue probiotic.  Antibiotics has been discontinued at this time after BKA.  Diabetes mellitus type II with hyperglycemia On insulin pump at home which has been disconnected.  Currently on Lantus, mealtime insulin, sliding-scale insulin.   Continue diabetic diet, Accu-Cheks, closely monitor blood glucose levels.  Latest POC glucose of 209  Hypokalemia, resolved  Hyponatremia Resolved.  Anemia of Chronic Disease Taken off iron supplements due to ongoing infection.  Transfuse for hemoglobin less than 7.  Latest hemoglobin of 8.7  Tobacco abuse Counseling done.  Acute on chronic pain on low-dose gabapentin; Percocet at home.  Currently, on Dilaudid pump after amputation  Deconditionoing Physical therapy and Occupational Therapy on board who recommend CIR   DVT prophylaxis: heparin injection 5,000 Units Start: 03/08/21 1400  Code Status: Full code  Family Communication:  Spoke with the patient's spouse at bedside at length.  Status is: Inpatient  Remains inpatient appropriate because:IV treatments appropriate due to intensity of illness or inability to take PO, Inpatient level of care appropriate due to severity of illness, status post amputation   Dispo: The patient is from: Home              Anticipated d/c is to:  CIR as per PT evaluation              Patient currently is not medically stable to d/c.   Difficult to place patient No  Consultants:  PCCM  Podiatry  Vascular surgery  Procedures:  None  Anti-infectives:  Marland Kitchen Vancomycin,Meropenem -discontinued  Subjective: Today, patient was seen and examined at bedside.  Patient denies any nausea vomiting and his diarrhea has slowed down.  He states that he has not been using much of his pump for pain.    Objective: Vitals:   03/15/21 0757 03/15/21  0825  BP: (!) 108/56   Pulse: 86   Resp: 17 16  Temp: 98.3 F (36.8 C)   SpO2: 99%     Intake/Output Summary (Last 24 hours) at 03/15/2021 1117 Last data filed at 03/14/2021 1915 Gross per 24 hour  Intake --  Output -40 ml  Net 40 ml   Filed Weights   03/07/21 1943 03/14/21 1522  Weight: 75 kg 74.4 kg   Body mass index is 24.22 kg/m.   Physical Exam: General:  Average built, not in obvious distress, chronically ill, alert awake communicative HENT:   No scleral pallor or icterus noted. Oral mucosa is moist.  Chest:   Diminished breath sounds bilaterally. No crackles or wheezes.  CVS: S1 &S2 heard. No murmur.  Regular rate and rhythm. Abdomen: Soft, nontender, nondistended.  Bowel sounds are heard.   Extremities: No cyanosis, clubbing or edema.    Status post right below-knee amputation with bandage.,  Left upper extremity AV fistula in place Psych: Alert, awake and communicative, low in mood, CNS:  No cranial nerve deficits.  Moves all extremities. Skin: Right lower extremity below-knee amputation.  Data Review: I have personally reviewed the following laboratory data and studies,  CBC: Recent Labs  Lab 03/08/21 1203 03/08/21 1834 03/09/21 0533 03/12/21 0151 03/13/21 0536 03/15/21 0249  WBC 11.8* 9.8 7.8 7.3 7.9 6.0  NEUTROABS 10.1*  --   --   --   --   --   HGB 10.1* 9.4* 9.1* 8.8* 8.7* 9.2*  HCT 29.4* 28.4* 26.2* 26.7* 27.8* 28.7*  MCV 92.7 92.8 91.3 94.3 97.2 96.6  PLT 175 171 152 167 209 229   Basic Metabolic Panel: Recent Labs  Lab 03/08/21 1203 03/08/21 1834 03/09/21 0533 03/10/21 0100 03/12/21 0151 03/13/21 0536 03/15/21 0249  NA 133*   < > 133* 134* 133* 135 137  K 3.0*   < > 3.2* 3.8 3.8 4.0 3.9  CL 93*   < > 94* 99 98 98 102  CO2 27   < > 24 26 24 24 27   GLUCOSE 152*   < > 256* 198* 164* 136* 241*  BUN 36*   < > 48* 22 54* 66* 18  CREATININE 6.35*   < > 7.25* 4.48* 7.46* 9.23* 3.65*  CALCIUM 8.5*   < > 8.3* 8.3* 8.0* 8.3* 7.7*  MG 1.7  --   1.8  --  1.7 1.9 1.7  PHOS  --   --  3.1  --   --  2.8 2.6   < > = values in this interval not displayed.   Liver Function Tests: Recent Labs  Lab 03/08/21 1203 03/08/21 1834  AST 58* 57*  ALT 27 31  ALKPHOS 96 117  BILITOT 0.7 0.8  PROT 6.0* 6.0*  ALBUMIN 2.9* 2.8*   No results for input(s): LIPASE, AMYLASE in the last 168 hours. No results for input(s): AMMONIA in the last 168 hours. Cardiac Enzymes: No results for input(s): CKTOTAL, CKMB, CKMBINDEX, TROPONINI in the last 168 hours. BNP (last 3 results) No results for input(s): BNP in the last 8760 hours.  ProBNP (last 3 results) No results for input(s): PROBNP  in the last 8760 hours.  CBG: Recent Labs  Lab 03/13/21 2147 03/14/21 0740 03/14/21 1145 03/14/21 2143 03/15/21 0756  GLUCAP 235* 314* 292* 174* 209*   Recent Results (from the past 240 hour(s))  Blood Culture (routine x 2)     Status: None   Collection Time: 03/07/21  8:22 PM   Specimen: BLOOD  Result Value Ref Range Status   Specimen Description BLOOD SITE NOT SPECIFIED  Final   Special Requests   Final    BOTTLES DRAWN AEROBIC AND ANAEROBIC Blood Culture adequate volume   Culture   Final    NO GROWTH 5 DAYS Performed at [facility], [place] [address], [place], [place] [address]    Report Status 03/13/2021 FINAL  Final  Blood Culture (routine x 2)     Status: None   Collection Time: 03/07/21  8:46 PM   Specimen: BLOOD LEFT HAND  Result Value Ref Range Status   Specimen Description BLOOD LEFT HAND  Final   Special Requests   Final    BOTTLES DRAWN AEROBIC AND ANAEROBIC Blood Culture adequate volume   Culture   Final    NO GROWTH 5 DAYS Performed at [facility], [place] [address], [place], [place] [address]    Report Status 03/13/2021 FINAL  Final  Resp Panel by RT-PCR (Flu A&B, Covid) Nasopharyngeal Swab     Status: None   Collection Time: 03/08/21  2:35 AM   Specimen: Nasopharyngeal Swab; Nasopharyngeal(NP) swabs in vial transport  medium  Result Value Ref Range Status   SARS Coronavirus 2 by RT PCR NEGATIVE NEGATIVE Final    Comment: (NOTE) SARS-CoV-2 target nucleic acids are NOT DETECTED.  The SARS-CoV-2 RNA is generally detectable in upper respiratory specimens during the acute phase of infection. The lowest concentration of SARS-CoV-2 viral copies this assay can detect is 138 copies/mL. A negative result does not preclude SARS-Cov-2 infection and should not be used as the sole basis for treatment or other patient management decisions. A negative result may occur with  improper specimen collection/handling, submission of specimen other than nasopharyngeal swab, presence of viral mutation(s) within the areas targeted by this assay, and inadequate number of viral copies(<138 copies/mL). A negative result must be combined with clinical observations, patient history, and epidemiological information. The expected result is Negative.  Fact Sheet for Patients:  EntrepreneurPulse.com.au  Fact Sheet for Healthcare Providers:  IncredibleEmployment.be  This test is no t yet approved or cleared by the [place] FDA and  has been authorized for detection and/or diagnosis of SARS-CoV-2 by FDA under an Emergency Use Authorization (EUA). This EUA will remain  in effect (meaning this test can be used) for the duration of the COVID-19 declaration under Section 564(b)(1) of the Act, 21 U.S.C.section 360bbb-3(b)(1), unless the authorization is terminated  or revoked sooner.       Influenza A by PCR NEGATIVE NEGATIVE Final   Influenza B by PCR NEGATIVE NEGATIVE Final    Comment: (NOTE) The Xpert Xpress SARS-CoV-2/FLU/RSV plus assay is intended as an aid in the diagnosis of influenza from Nasopharyngeal swab specimens and should not be used as a sole basis for treatment. Nasal washings and aspirates are unacceptable for Xpert Xpress SARS-CoV-2/FLU/RSV testing.  Fact Sheet for  Patients: EntrepreneurPulse.com.au  Fact Sheet for Healthcare Providers: IncredibleEmployment.be  This test is not yet approved or cleared by the [place] FDA and has been authorized for detection and/or diagnosis of SARS-CoV-2 by FDA under an Emergency Use  Authorization (EUA). This EUA will remain in effect (meaning this test can be used) for the duration of the COVID-19 declaration under Section 564(b)(1) of the Act, 21 U.S.C. section 360bbb-3(b)(1), unless the authorization is terminated or revoked.  Performed at [facility], [place] [address], [place], [place] [address]   MRSA PCR Screening     Status: None   Collection Time: 03/08/21  6:40 PM   Specimen: Nasal Mucosa; Nasopharyngeal  Result Value Ref Range Status   MRSA by PCR NEGATIVE NEGATIVE Final    Comment:        The GeneXpert MRSA Assay (FDA approved for NASAL specimens only), is one component of a comprehensive MRSA colonization surveillance program. It is not intended to diagnose MRSA infection nor to guide or monitor treatment for MRSA infections. Performed at [facility], [place] [address], [place], [place] [address]   Gastrointestinal Panel by PCR , Stool     Status: None   Collection Time: 03/09/21  3:52 AM   Specimen: STOOL  Result Value Ref Range Status   Campylobacter species NOT DETECTED NOT DETECTED Final   Plesimonas shigelloides NOT DETECTED NOT DETECTED Final   Salmonella species NOT DETECTED NOT DETECTED Final   Yersinia enterocolitica NOT DETECTED NOT DETECTED Final   Vibrio species NOT DETECTED NOT DETECTED Final   Vibrio cholerae NOT DETECTED NOT DETECTED Final   Enteroaggregative E coli (EAEC) NOT DETECTED NOT DETECTED Final   Enteropathogenic E coli (EPEC) NOT DETECTED NOT DETECTED Final   Enterotoxigenic E coli (ETEC) NOT DETECTED NOT DETECTED Final   Shiga like toxin producing E coli (STEC) NOT DETECTED NOT DETECTED Final    Shigella/Enteroinvasive E coli (EIEC) NOT DETECTED NOT DETECTED Final   Cryptosporidium NOT DETECTED NOT DETECTED Final   Cyclospora cayetanensis NOT DETECTED NOT DETECTED Final   Entamoeba histolytica NOT DETECTED NOT DETECTED Final   Giardia lamblia NOT DETECTED NOT DETECTED Final   Adenovirus F40/41 NOT DETECTED NOT DETECTED Final   Astrovirus NOT DETECTED NOT DETECTED Final   Norovirus GI/GII NOT DETECTED NOT DETECTED Final   Rotavirus A NOT DETECTED NOT DETECTED Final   Sapovirus (I, II, IV, and V) NOT DETECTED NOT DETECTED Final    Comment: Performed at [facility] [place] [facility], [place] [place], [place] [address]  C Difficile Quick Screen w PCR reflex     Status: None   Collection Time: 03/09/21  3:52 AM   Specimen: STOOL  Result Value Ref Range Status   C Diff antigen NEGATIVE NEGATIVE Final   C Diff toxin NEGATIVE NEGATIVE Final   C Diff interpretation No C. difficile detected.  Final    Comment: Performed at [facility], [place] [address], [place], [place] [address]     Studies: No results found.   Flora Lipps, MD  [facility] 03/15/2021  If 7PM-7AM, please contact night-coverage

## 2021-03-15 NOTE — Progress Notes (Addendum)
  Progress Note    03/15/2021 7:37 AM 2 Days Post-Op  Subjective:  Still requiring dilaudid PCA   Vitals:   03/15/21 0353 03/15/21 0505  BP:    Pulse:    Resp:  15  Temp: 98.4 F (36.9 C)   SpO2:  98%    Physical Exam: Incisions:  R BKA incision c/d/i   CBC    Component Value Date/Time   WBC 6.0 03/15/2021 0249   RBC 2.97 (L) 03/15/2021 0249   HGB 9.2 (L) 03/15/2021 0249   HGB 9.5 (L) 06/26/2020 1527   HCT 28.7 (L) 03/15/2021 0249   HCT 27.6 (L) 06/26/2020 1527   PLT 208 03/15/2021 0249   PLT 203 06/26/2020 1527   MCV 96.6 03/15/2021 0249   MCV 93 06/26/2020 1527   MCH 31.0 03/15/2021 0249   MCHC 32.1 03/15/2021 0249   RDW 13.3 03/15/2021 0249   RDW 11.9 06/26/2020 1527   LYMPHSABS 0.8 03/08/2021 1203   LYMPHSABS 1.0 06/26/2020 1527   MONOABS 0.8 03/08/2021 1203   EOSABS 0.0 03/08/2021 1203   EOSABS 0.2 06/26/2020 1527   BASOSABS 0.0 03/08/2021 1203   BASOSABS 0.0 06/26/2020 1527    BMET    Component Value Date/Time   NA 137 03/15/2021 0249   K 3.9 03/15/2021 0249   CL 102 03/15/2021 0249   CO2 27 03/15/2021 0249   GLUCOSE 241 (H) 03/15/2021 0249   BUN 18 03/15/2021 0249   CREATININE 3.65 (H) 03/15/2021 0249   CREATININE 2.22 (H) 09/07/2013 0845   CALCIUM 7.7 (L) 03/15/2021 0249   GFRNONAA 18 (L) 03/15/2021 0249   GFRAA 9 (L) 07/31/2020 2100    INR    Component Value Date/Time   INR 1.4 (H) 03/08/2021 1203     Intake/Output Summary (Last 24 hours) at 03/15/2021 0737 Last data filed at 03/14/2021 1915 Gross per 24 hour  Intake --  Output -40 ml  Net 40 ml     Assessment/Plan:  65 y.o. male is s/p right below knee amputation  2 Days Post-Op  -R BKA incision healing well -Encouraged OOB -Ready for d/c to CIR once weaned from [place], PA-C [facility] [phone] 03/15/2021 7:37 AM    I have independently examined patient and agree with PA assessment and plan above.   Jaxson Anglin C. Donzetta Matters,  MD [facility] [place] Office: [phone] Pager: [phone]

## 2021-03-15 NOTE — Progress Notes (Signed)
IP rehab admissions - Patient asleep when I rounded earlier.  I will open the case with insurance carrier and request acute inpatient rehab admission.  I will follow up once I hear back from insurance case manager.  Call for questions.  [phone]

## 2021-03-15 NOTE — Plan of Care (Signed)
  Problem: Education: Goal: Knowledge of General Education information will improve Description: Including pain rating scale, medication(s)/side effects and non-pharmacologic comfort measures Outcome: Progressing   Problem: Health Behavior/Discharge Planning: Goal: Ability to manage health-related needs will improve Outcome: Progressing   Problem: Clinical Measurements: Goal: Ability to maintain clinical measurements within normal limits will improve Outcome: Progressing Goal: Will remain free from infection Outcome: Progressing Goal: Diagnostic test results will improve Outcome: Progressing Goal: Respiratory complications will improve Outcome: Progressing Goal: Cardiovascular complication will be avoided Outcome: Progressing   Problem: Activity: Goal: Risk for activity intolerance will decrease Outcome: Progressing   Problem: Nutrition: Goal: Adequate nutrition will be maintained Outcome: Progressing   Problem: Coping: Goal: Level of anxiety will decrease Outcome: Progressing   Problem: Elimination: Goal: Will not experience complications related to bowel motility Outcome: Progressing Goal: Will not experience complications related to urinary retention Outcome: Progressing   Problem: Pain Managment: Goal: General experience of comfort will improve Outcome: Progressing   Problem: Safety: Goal: Ability to remain free from injury will improve Outcome: Progressing   Problem: Skin Integrity: Goal: Risk for impaired skin integrity will decrease Outcome: Progressing   Problem: Fluid Volume: Goal: Hemodynamic stability will improve Outcome: Progressing   Problem: Clinical Measurements: Goal: Diagnostic test results will improve Outcome: Progressing Goal: Signs and symptoms of infection will decrease Outcome: Progressing   Problem: Respiratory: Goal: Ability to maintain adequate ventilation will improve Outcome: Progressing   Problem: Education: Goal:  Knowledge of disease and its progression will improve Outcome: Progressing Goal: Individualized Educational Video(s) Outcome: Progressing   Problem: Fluid Volume: Goal: Compliance with measures to maintain balanced fluid volume will improve Outcome: Progressing   Problem: Health Behavior/Discharge Planning: Goal: Ability to manage health-related needs will improve Outcome: Progressing   Problem: Nutritional: Goal: Ability to make healthy dietary choices will improve Outcome: Progressing   Problem: Clinical Measurements: Goal: Complications related to the disease process, condition or treatment will be avoided or minimized Outcome: Progressing

## 2021-03-15 NOTE — Progress Notes (Signed)
Orthopedic Tech Progress Note Patient Details:  Duane Hall Duane Hall [birth year] [id number] Ordered brace  Patient ID: Duane Hall, male   DOB: 11-24-1956, 65 y.o.   MRN: [id number]   Duane Hall 03/15/2021, 8:25 AM

## 2021-03-16 ENCOUNTER — Inpatient Hospital Stay (HOSPITAL_COMMUNITY)
Admission: RE | Admit: 2021-03-16 | Discharge: 2021-03-26 | DRG: 559 | Disposition: A | Discharge status: Home Health | Payer: BC Managed Care – PPO | Source: Intra-hospital | Attending: Physical Medicine & Rehabilitation | Admitting: Physical Medicine & Rehabilitation

## 2021-03-16 ENCOUNTER — Encounter (HOSPITAL_COMMUNITY): Payer: Self-pay | Admitting: Physical Medicine & Rehabilitation

## 2021-03-16 ENCOUNTER — Other Ambulatory Visit: Payer: Self-pay

## 2021-03-16 ENCOUNTER — Telehealth: Payer: BC Managed Care – PPO | Admitting: Endocrinology

## 2021-03-16 DIAGNOSIS — Z7982 Long term (current) use of aspirin: Secondary | ICD-10-CM

## 2021-03-16 DIAGNOSIS — Z880 Allergy status to penicillin: Secondary | ICD-10-CM | POA: Diagnosis not present

## 2021-03-16 DIAGNOSIS — S92333D Displaced fracture of third metatarsal bone, unspecified foot, subsequent encounter for fracture with routine healing: Secondary | ICD-10-CM | POA: Diagnosis not present

## 2021-03-16 DIAGNOSIS — D631 Anemia in chronic kidney disease: Secondary | ICD-10-CM | POA: Diagnosis present

## 2021-03-16 DIAGNOSIS — I959 Hypotension, unspecified: Secondary | ICD-10-CM | POA: Diagnosis not present

## 2021-03-16 DIAGNOSIS — Z89511 Acquired absence of right leg below knee: Secondary | ICD-10-CM | POA: Diagnosis not present

## 2021-03-16 DIAGNOSIS — Z992 Dependence on renal dialysis: Secondary | ICD-10-CM | POA: Diagnosis not present

## 2021-03-16 DIAGNOSIS — T8743 Infection of amputation stump, right lower extremity: Secondary | ICD-10-CM | POA: Diagnosis not present

## 2021-03-16 DIAGNOSIS — R252 Cramp and spasm: Secondary | ICD-10-CM | POA: Diagnosis not present

## 2021-03-16 DIAGNOSIS — S92323D Displaced fracture of second metatarsal bone, unspecified foot, subsequent encounter for fracture with routine healing: Secondary | ICD-10-CM

## 2021-03-16 DIAGNOSIS — E1122 Type 2 diabetes mellitus with diabetic chronic kidney disease: Secondary | ICD-10-CM | POA: Diagnosis not present

## 2021-03-16 DIAGNOSIS — Z79899 Other long term (current) drug therapy: Secondary | ICD-10-CM

## 2021-03-16 DIAGNOSIS — D638 Anemia in other chronic diseases classified elsewhere: Secondary | ICD-10-CM | POA: Diagnosis present

## 2021-03-16 DIAGNOSIS — M7989 Other specified soft tissue disorders: Secondary | ICD-10-CM | POA: Diagnosis not present

## 2021-03-16 DIAGNOSIS — M898X9 Other specified disorders of bone, unspecified site: Secondary | ICD-10-CM | POA: Diagnosis present

## 2021-03-16 DIAGNOSIS — G8918 Other acute postprocedural pain: Secondary | ICD-10-CM

## 2021-03-16 DIAGNOSIS — K5903 Drug induced constipation: Secondary | ICD-10-CM

## 2021-03-16 DIAGNOSIS — R54 Age-related physical debility: Secondary | ICD-10-CM | POA: Diagnosis present

## 2021-03-16 DIAGNOSIS — H409 Unspecified glaucoma: Secondary | ICD-10-CM | POA: Diagnosis present

## 2021-03-16 DIAGNOSIS — Z809 Family history of malignant neoplasm, unspecified: Secondary | ICD-10-CM

## 2021-03-16 DIAGNOSIS — J449 Chronic obstructive pulmonary disease, unspecified: Secondary | ICD-10-CM | POA: Diagnosis present

## 2021-03-16 DIAGNOSIS — Z4781 Encounter for orthopedic aftercare following surgical amputation: Principal | ICD-10-CM

## 2021-03-16 DIAGNOSIS — I953 Hypotension of hemodialysis: Secondary | ICD-10-CM

## 2021-03-16 DIAGNOSIS — I120 Hypertensive chronic kidney disease with stage 5 chronic kidney disease or end stage renal disease: Secondary | ICD-10-CM | POA: Diagnosis not present

## 2021-03-16 DIAGNOSIS — N2581 Secondary hyperparathyroidism of renal origin: Secondary | ICD-10-CM | POA: Diagnosis not present

## 2021-03-16 DIAGNOSIS — Z888 Allergy status to other drugs, medicaments and biological substances status: Secondary | ICD-10-CM | POA: Diagnosis not present

## 2021-03-16 DIAGNOSIS — N186 End stage renal disease: Secondary | ICD-10-CM | POA: Diagnosis not present

## 2021-03-16 DIAGNOSIS — Z833 Family history of diabetes mellitus: Secondary | ICD-10-CM

## 2021-03-16 DIAGNOSIS — E11649 Type 2 diabetes mellitus with hypoglycemia without coma: Secondary | ICD-10-CM | POA: Diagnosis not present

## 2021-03-16 DIAGNOSIS — F17210 Nicotine dependence, cigarettes, uncomplicated: Secondary | ICD-10-CM | POA: Diagnosis present

## 2021-03-16 DIAGNOSIS — Z794 Long term (current) use of insulin: Secondary | ICD-10-CM | POA: Diagnosis not present

## 2021-03-16 DIAGNOSIS — E1151 Type 2 diabetes mellitus with diabetic peripheral angiopathy without gangrene: Secondary | ICD-10-CM | POA: Diagnosis not present

## 2021-03-16 DIAGNOSIS — I96 Gangrene, not elsewhere classified: Secondary | ICD-10-CM | POA: Diagnosis not present

## 2021-03-16 DIAGNOSIS — E119 Type 2 diabetes mellitus without complications: Secondary | ICD-10-CM | POA: Diagnosis not present

## 2021-03-16 DIAGNOSIS — G546 Phantom limb syndrome with pain: Secondary | ICD-10-CM | POA: Diagnosis present

## 2021-03-16 DIAGNOSIS — L8996 Pressure-induced deep tissue damage of unspecified site: Secondary | ICD-10-CM | POA: Diagnosis not present

## 2021-03-16 DIAGNOSIS — I7300 Raynaud's syndrome without gangrene: Secondary | ICD-10-CM | POA: Diagnosis not present

## 2021-03-16 DIAGNOSIS — Z7902 Long term (current) use of antithrombotics/antiplatelets: Secondary | ICD-10-CM

## 2021-03-16 DIAGNOSIS — L89623 Pressure ulcer of left heel, stage 3: Secondary | ICD-10-CM | POA: Diagnosis not present

## 2021-03-16 DIAGNOSIS — D649 Anemia, unspecified: Secondary | ICD-10-CM

## 2021-03-16 DIAGNOSIS — R0989 Other specified symptoms and signs involving the circulatory and respiratory systems: Secondary | ICD-10-CM

## 2021-03-16 DIAGNOSIS — D696 Thrombocytopenia, unspecified: Secondary | ICD-10-CM

## 2021-03-16 DIAGNOSIS — E876 Hypokalemia: Secondary | ICD-10-CM | POA: Diagnosis not present

## 2021-03-16 LAB — CBC
HCT: 26.8 % — ABNORMAL LOW (ref 39.0–52.0)
Hemoglobin: 8.5 g/dL — ABNORMAL LOW (ref 13.0–17.0)
MCH: 30.9 pg (ref 26.0–34.0)
MCHC: 31.7 g/dL (ref 30.0–36.0)
MCV: 97.5 fL (ref 80.0–100.0)
Platelets: 223 10*3/uL (ref 150–400)
RBC: 2.75 MIL/uL — ABNORMAL LOW (ref 4.22–5.81)
RDW: 13.7 % (ref 11.5–15.5)
WBC: 5.6 10*3/uL (ref 4.0–10.5)
nRBC: 0 % (ref 0.0–0.2)

## 2021-03-16 LAB — MAGNESIUM: Magnesium: 1.8 mg/dL (ref 1.7–2.4)

## 2021-03-16 LAB — BASIC METABOLIC PANEL
Anion gap: 8 (ref 5–15)
BUN: 27 mg/dL — ABNORMAL HIGH (ref 8–23)
CO2: 28 mmol/L (ref 22–32)
Calcium: 7.6 mg/dL — ABNORMAL LOW (ref 8.9–10.3)
Chloride: 102 mmol/L (ref 98–111)
Creatinine, Ser: 5.64 mg/dL — ABNORMAL HIGH (ref 0.61–1.24)
GFR, Estimated: 11 mL/min — ABNORMAL LOW (ref >60)
Glucose, Bld: 141 mg/dL — ABNORMAL HIGH (ref 70–99)
Potassium: 4 mmol/L (ref 3.5–5.1)
Sodium: 138 mmol/L (ref 135–145)

## 2021-03-16 LAB — GLUCOSE, CAPILLARY
Glucose-Capillary: 198 mg/dL — ABNORMAL HIGH (ref 70–99)
Glucose-Capillary: 173 mg/dL — ABNORMAL HIGH (ref 70–99)
Glucose-Capillary: 114 mg/dL — ABNORMAL HIGH (ref 70–99)

## 2021-03-16 MED ORDER — HEPARIN SODIUM (PORCINE) 5000 UNIT/ML IJ SOLN
5000.0000 [IU] | Freq: Three times a day (TID) | INTRAMUSCULAR | Status: DC
  Administered 2021-03-17 – 2021-03-25 (×20): 5000 [IU] via SUBCUTANEOUS
  Filled 2021-03-16 (×24): qty 1

## 2021-03-16 MED ORDER — GABAPENTIN 100 MG PO CAPS
100.0000 mg | ORAL_CAPSULE | Freq: Every day | ORAL | Status: DC
  Administered 2021-03-17 – 2021-03-26 (×10): 100 mg via ORAL
  Filled 2021-03-16 (×10): qty 1

## 2021-03-16 MED ORDER — PROCHLORPERAZINE EDISYLATE 10 MG/2ML IJ SOLN: 10.0000 mg | Freq: Four times a day (QID) | INTRAMUSCULAR | Status: DC | PRN

## 2021-03-16 MED ORDER — PANTOPRAZOLE SODIUM 40 MG PO TBEC: 40.0000 mg | DELAYED_RELEASE_TABLET | Freq: Every day | ORAL | Status: AC

## 2021-03-16 MED ORDER — BOOST / RESOURCE BREEZE PO LIQD CUSTOM
1.0000 | Freq: Three times a day (TID) | ORAL | Status: DC
  Administered 2021-03-20: 1 via ORAL

## 2021-03-16 MED ORDER — RISAQUAD PO CAPS
2.0000 | ORAL_CAPSULE | Freq: Every day | ORAL | Status: DC
  Administered 2021-03-17 – 2021-03-26 (×9): 2 via ORAL
  Filled 2021-03-16 (×10): qty 2

## 2021-03-16 MED ORDER — INSULIN ASPART 100 UNIT/ML ~~LOC~~ SOLN
3.0000 [IU] | Freq: Three times a day (TID) | SUBCUTANEOUS | Status: DC
  Administered 2021-03-17 – 2021-03-18 (×5): 3 [IU] via SUBCUTANEOUS

## 2021-03-16 MED ORDER — OXYCODONE HCL 5 MG PO TABS
10.0000 mg | ORAL_TABLET | ORAL | Status: DC | PRN
  Administered 2021-03-17 – 2021-03-21 (×8): 10 mg via ORAL
  Filled 2021-03-16 (×9): qty 2

## 2021-03-16 MED ORDER — ONDANSETRON HCL 4 MG/2ML IJ SOLN: 4.0000 mg | Freq: Four times a day (QID) | INTRAMUSCULAR | Status: DC | PRN

## 2021-03-16 MED ORDER — OXYCODONE HCL 10 MG PO TABS: 10.0000 mg | ORAL_TABLET | ORAL | Status: DC | PRN

## 2021-03-16 MED ORDER — LOPERAMIDE HCL 2 MG PO CAPS: 2.0000 mg | ORAL_CAPSULE | Freq: Four times a day (QID) | ORAL | Status: DC | PRN

## 2021-03-16 MED ORDER — ACETAMINOPHEN 325 MG PO TABS
650.0000 mg | ORAL_TABLET | Freq: Four times a day (QID) | ORAL | Status: DC | PRN
  Filled 2021-03-16: qty 2

## 2021-03-16 MED ORDER — METHOCARBAMOL 500 MG PO TABS
500.0000 mg | ORAL_TABLET | Freq: Four times a day (QID) | ORAL | Status: DC | PRN
  Administered 2021-03-19 – 2021-03-21 (×3): 500 mg via ORAL
  Filled 2021-03-16 (×4): qty 1

## 2021-03-16 MED ORDER — TRAZODONE HCL 50 MG PO TABS
25.0000 mg | ORAL_TABLET | Freq: Every evening | ORAL | Status: DC | PRN
  Filled 2021-03-16 (×3): qty 1

## 2021-03-16 MED ORDER — HYDROMORPHONE HCL 1 MG/ML IJ SOLN: 0.5000 mg | INTRAMUSCULAR | Status: DC | PRN

## 2021-03-16 MED ORDER — ROSUVASTATIN CALCIUM 5 MG PO TABS
10.0000 mg | ORAL_TABLET | Freq: Every morning | ORAL | Status: DC
  Administered 2021-03-17 – 2021-03-26 (×10): 10 mg via ORAL
  Filled 2021-03-16 (×11): qty 2

## 2021-03-16 MED ORDER — GABAPENTIN 100 MG PO CAPS: 100.0000 mg | ORAL_CAPSULE | Freq: Every morning | ORAL | Status: DC

## 2021-03-16 MED ORDER — ONDANSETRON HCL 4 MG PO TABS
4.0000 mg | ORAL_TABLET | Freq: Four times a day (QID) | ORAL | Status: DC | PRN
  Filled 2021-03-16: qty 1

## 2021-03-16 MED ORDER — PANTOPRAZOLE SODIUM 40 MG PO TBEC
40.0000 mg | DELAYED_RELEASE_TABLET | Freq: Every day | ORAL | Status: DC
  Administered 2021-03-17 – 2021-03-26 (×10): 40 mg via ORAL
  Filled 2021-03-16 (×10): qty 1

## 2021-03-16 MED ORDER — ASPIRIN 81 MG PO CHEW
81.0000 mg | CHEWABLE_TABLET | Freq: Every day | ORAL | Status: DC
  Administered 2021-03-17 – 2021-03-26 (×10): 81 mg via ORAL
  Filled 2021-03-16 (×10): qty 1

## 2021-03-16 MED ORDER — INSULIN GLARGINE 100 UNIT/ML ~~LOC~~ SOLN
10.0000 [IU] | Freq: Every day | SUBCUTANEOUS | Status: DC
  Administered 2021-03-17 – 2021-03-19 (×3): 10 [IU] via SUBCUTANEOUS
  Filled 2021-03-16 (×4): qty 0.1

## 2021-03-16 MED ORDER — INSULIN ASPART 100 UNIT/ML ~~LOC~~ SOLN
0.0000 [IU] | Freq: Every day | SUBCUTANEOUS | Status: DC
  Administered 2021-03-20: 2 [IU] via SUBCUTANEOUS

## 2021-03-16 MED ORDER — ACETAMINOPHEN 650 MG RE SUPP: 650.0000 mg | Freq: Four times a day (QID) | RECTAL | Status: DC | PRN

## 2021-03-16 MED ORDER — CLOPIDOGREL BISULFATE 75 MG PO TABS
75.0000 mg | ORAL_TABLET | Freq: Every day | ORAL | Status: DC
  Administered 2021-03-17 – 2021-03-26 (×10): 75 mg via ORAL
  Filled 2021-03-16 (×10): qty 1

## 2021-03-16 MED ORDER — DARBEPOETIN ALFA 60 MCG/0.3ML IJ SOSY: 60.0000 ug | PREFILLED_SYRINGE | INTRAMUSCULAR | Status: DC

## 2021-03-16 MED ORDER — RISAQUAD PO CAPS: 2.0000 | ORAL_CAPSULE | Freq: Every day | ORAL | Status: DC

## 2021-03-16 MED ORDER — INSULIN ASPART 100 UNIT/ML ~~LOC~~ SOLN
0.0000 [IU] | Freq: Three times a day (TID) | SUBCUTANEOUS | Status: DC
  Administered 2021-03-17: 2 [IU] via SUBCUTANEOUS
  Administered 2021-03-17: 3 [IU] via SUBCUTANEOUS
  Administered 2021-03-17: 2 [IU] via SUBCUTANEOUS
  Administered 2021-03-18 – 2021-03-19 (×3): 3 [IU] via SUBCUTANEOUS
  Administered 2021-03-20: 5 [IU] via SUBCUTANEOUS
  Administered 2021-03-22: 2 [IU] via SUBCUTANEOUS
  Administered 2021-03-23: 3 [IU] via SUBCUTANEOUS
  Administered 2021-03-24 (×2): 5 [IU] via SUBCUTANEOUS
  Administered 2021-03-25: 8 [IU] via SUBCUTANEOUS
  Administered 2021-03-25 – 2021-03-26 (×3): 3 [IU] via SUBCUTANEOUS

## 2021-03-16 MED ORDER — RENA-VITE PO TABS
1.0000 | ORAL_TABLET | Freq: Every day | ORAL | Status: DC
  Administered 2021-03-17 – 2021-03-25 (×9): 1 via ORAL
  Filled 2021-03-16 (×9): qty 1

## 2021-03-16 NOTE — Progress Notes (Signed)
IP rehab admissions - I have faxed additional information to insurance case manager as requested.  I hope to have a decision about possible CIR admit later today.  We do have beds available.  I will let all know if/when I get insurance decision.  Call for questions.  [phone]

## 2021-03-16 NOTE — H&P (Addendum)
Physical Medicine and Rehabilitation Admission H&P        Chief Complaint  Patient presents with  . Fever  : HPI: This is a 65 year old white male with a history of end-stage renal disease on hemodialysis as well as diabetes and peripheral vascular disease.  He developed a ulcer on the right toe with gangrene that required amputation in January of this year.  This wound continued to progress.  He was admitted to the emergency room on March 07, 2021 with sepsis and infection of this ulcer.  X-rays revealed questionable pathologic fractures in the foot as well.  MRI ultimately revealed impacted fractures of the second and third metatarsal necks.  The patient failed antibiotics and more conservative measures conservative measures and ultimately he underwent a right below-knee amputation on March 13, 2021.  Course has been complicated by right stump pain as well as poor appetite and nausea related to antibiotics.  He has been maintained on dialysis per nephrology services here at the hospital.  Patient was ultimately evaluated by physical and Occupational Therapy and after review by the rehab admissions team it was felt that he would benefit from an inpatient rehab stay.   Review of Systems  Constitutional: Positive for malaise/fatigue and weight loss. Negative for chills and fever.  HENT: Negative for congestion and hearing loss.   Eyes: Negative for blurred vision and double vision.  Respiratory: Negative for cough, hemoptysis and shortness of breath.   Cardiovascular: Positive for leg swelling. Negative for chest pain and palpitations.  Gastrointestinal: Positive for diarrhea, nausea and vomiting. Negative for constipation.  Genitourinary: Negative for dysuria.  Musculoskeletal: Positive for myalgias.  Skin: Negative for rash.  Neurological: Positive for focal weakness and weakness. Negative for dizziness and headaches.  Psychiatric/Behavioral: Negative for depression. The patient is not  nervous/anxious.         Past Medical History:  Diagnosis Date  . Asthma    . Cancer ([place])    . Cataract    . CKD (chronic kidney disease), stage III ([place])    . Diabetes mellitus    . Glaucoma    . Vascular insufficiency 05/2020         Past Surgical History:  Procedure Laterality Date  . ABDOMINAL AORTOGRAM W/LOWER EXTREMITY N/A 06/19/2020    Procedure: ABDOMINAL AORTOGRAM W/LOWER EXTREMITY;  Surgeon: Waynetta Sandy, MD;  Location: [facility];  Service: Cardiovascular;  Laterality: N/A;  . ABDOMINAL AORTOGRAM W/LOWER EXTREMITY Left 10/16/2020    Procedure: ABDOMINAL AORTOGRAM W/LOWER EXTREMITY;  Surgeon: Waynetta Sandy, MD;  Location: [facility];  Service: Cardiovascular;  Laterality: Left;  . ABDOMINAL AORTOGRAM W/LOWER EXTREMITY N/A 12/25/2020    Procedure: ABDOMINAL AORTOGRAM W/LOWER EXTREMITY;  Surgeon: Waynetta Sandy, MD;  Location: [facility];  Service: Cardiovascular;  Laterality: N/A;  . AMPUTATION Right 01/12/2021    Procedure: 1ST AMPUTATION RAY;  Surgeon: [name], DPM;  Location: [facility];  Service: Podiatry;  Laterality: Right;  . AMPUTATION Right 03/13/2021    Procedure: RIGHT BELOW KNEE AMPUTATION;  Surgeon: Waynetta Sandy, MD;  Location: [facility];  Service: Vascular;  Laterality: Right;  . AV FISTULA PLACEMENT Right 09/14/2019    Procedure: Right Arm Basilic Vein transposition;  Surgeon: Angelia Mould, MD;  Location: [facility];  Service: Vascular;  Laterality: Right;  . BONE BIOPSY Left 07/29/2020    Procedure: BONE BIOPSY;  Surgeon: [name], DPM;  Location: [facility];  Service: Podiatry;  Laterality: Left;  Need bone trephines and/or large bore Giamshidi  . COLONOSCOPY      . PERIPHERAL VASCULAR ATHERECTOMY Left 06/19/2020    Procedure: PERIPHERAL VASCULAR ATHERECTOMY;  Surgeon: Waynetta Sandy, MD;  Location: [facility];  Service: Cardiovascular;  Laterality: Left;  SFA  .  PERIPHERAL VASCULAR ATHERECTOMY   12/25/2020    Procedure: PERIPHERAL VASCULAR ATHERECTOMY;  Surgeon: Waynetta Sandy, MD;  Location: [facility];  Service: Cardiovascular;;  Lt. PT - Laser Lt. SFA - Laser  . PERIPHERAL VASCULAR BALLOON ANGIOPLASTY   12/25/2020    Procedure: PERIPHERAL VASCULAR BALLOON ANGIOPLASTY;  Surgeon: Waynetta Sandy, MD;  Location: [facility];  Service: Cardiovascular;;  Lt. SFA and PT  . UPPER GASTROINTESTINAL ENDOSCOPY   02/2019    Dr Benson Norway           Family History  Problem Relation Age of Onset  . Cancer Father    . Diabetes Mother      Social History:  reports that he has been smoking cigarettes. He has been smoking about 0.30 packs per day. He has never used smokeless tobacco. He reports that he does not drink alcohol and does not use drugs. Allergies:       Allergies  Allergen Reactions  . Amoxicillin-Pot Clavulanate Diarrhea and Other (See Comments)  . Midodrine Other (See Comments)      Other reaction(s): Urinary Sensation  . Tape Other (See Comments)      bandaids cause blistering             Facility-Administered Medications Prior to Admission  Medication Dose Route Frequency Provider Last Rate Last Admin  . 0.9 %  sodium chloride infusion  250 mL Intravenous PRN Waynetta Sandy, MD      . 0.9 %  sodium chloride infusion  250 mL Intravenous PRN Waynetta Sandy, MD      . sodium chloride flush (NS) 0.9 % injection 3 mL  3 mL Intravenous Q12H Waynetta Sandy, MD        Medications Prior to Admission  Medication Sig Dispense Refill  . aspirin EC 81 MG EC tablet Take 1 tablet (81 mg total) by mouth daily. Swallow whole. (Patient taking differently: Take 81 mg by mouth every morning. Swallow whole.) 30 tablet 11  . clopidogrel (PLAVIX) 75 MG tablet Take 1 tablet (75 mg total) by mouth daily with breakfast. (Patient taking differently: Take 75 mg by mouth every morning.) 30 tablet 3  .  collagenase (SANTYL) ointment Apply 1 application topically See admin instructions. Apply to the right foot incision 90 g 2  . Continuous Blood Gluc Sensor (FREESTYLE LIBRE 14 DAY SENSOR) MISC 1 Device by Other route every 14 (fourteen) days. 6 each 3  . docusate sodium (COLACE) 100 MG capsule Take 100 mg by mouth every morning.      . ferric citrate (AURYXIA) 1 GM 210 MG(Fe) tablet Take 210 mg by mouth 2 (two) times daily with a meal.      . Glucagon (GVOKE HYPOPEN 1-PACK) 1 MG/0.2ML SOAJ Inject 1 mg into the skin once as needed (low blood sugar).      . Insulin Aspart, w/Niacinamide, (FIASP) 100 UNIT/ML SOLN For use in pump, total of 30 units per day 90 mL 0  . Insulin Disposable Pump (OMNIPOD DASH 5 PACK PODS) MISC Use as instructed: use pod every 3 days. 30 each 3  . Insulin Pen Needle (PEN  NEEDLES) 31G X 6 MM MISC 1 Device by Does not apply route in the morning, at noon, in the evening, and at bedtime. 360 each 3  . iron sucrose in sodium chloride 0.9 % 100 mL Iron Sucrose (Venofer)      . lidocaine-prilocaine (EMLA) cream Apply 1 application topically See admin instructions. Apply topically to port access one hour prior to dialysis on Sunday, Monday, Wednesday, Thursday      . Methoxy PEG-Epoetin Beta (MIRCERA IJ) Inject into the skin every 30 (thirty) days.      . multivitamin (RENA-VIT) TABS tablet Take 1 tablet by mouth every morning.      . mupirocin ointment (BACTROBAN) 2 % Apply 1 application topically 2 (two) times daily. 15 g 2  . omeprazole (PRILOSEC) 40 MG capsule Take 40 mg by mouth every morning.      . rosuvastatin (CRESTOR) 10 MG tablet TAKE 1 TABLET BY MOUTH DAILY (Patient taking differently: Take 10 mg by mouth every morning.) 30 tablet 11  . [DISCONTINUED] gabapentin (NEURONTIN) 100 MG capsule Take 300 mg by mouth every morning.          Drug Regimen Review  Drug regimen was reviewed and remains appropriate with no significant issues identified   Home: Home  Living Family/patient expects to be discharged to:: Private residence Living Arrangements: Spouse/significant other Available Help at Discharge: Family Type of Home: House Home Access: Stairs to enter Technical brewer of Steps: 7 Entrance Stairs-Rails: Right,Left Home Layout: One level Bathroom Shower/Tub: Multimedia programmer: Standard Bathroom Accessibility: Yes Home Equipment: Shower seat,Wheelchair - manual,Walker - 2 wheels,Walker - 4 wheels (pt states his walker "has a seat" - need clarification that he has a RW)   Functional History: Prior Function Level of Independence: Needs assistance Gait / Transfers Assistance Needed: uses w/c since 1/14 amputation, modI for transfer ADL's / Homemaking Assistance Needed: independent Comments: was independent prior to toe amputation, including driving   Functional Status:  Mobility: Bed Mobility Overal bed mobility: Needs Assistance Bed Mobility: Supine to Sit Supine to sit: Min assist Sit to supine: Supervision General bed mobility comments: minG with cues and increased time. pt using UE to raise RLE and significant UE on bed rail to pull to sitting EOB Transfers Overall transfer level: Needs assistance Equipment used: Rolling walker (2 wheeled) Transfers: Sit to/from [employer] Sit to Stand: Min assist,+2 physical assistance (or modA of 1) Stand pivot transfers: Min assist,+2 physical assistance General transfer comment: minA of 2 to rise from EOB or modA of 1 with increased cues for hand positioning, LLE positioning, and posture/RLE straightening once in standing. pt using pivot on LLE initially to maneuver to recliner. Ambulation/Gait Ambulation/Gait assistance: Min assist,+2 physical assistance Gait Distance (Feet): 3 Feet Assistive device: Rolling walker (2 wheeled) Gait Pattern/deviations: Trunk flexed General Gait Details: hop-to pattern with cueing for technique, use of UE to press up and  complete hop-to pattern. Pt able to complete hop forwards and back on LLE. Gait velocity: decreased   ADL: ADL Overall ADL's : Needs assistance/impaired Eating/Feeding: Set up,Sitting Grooming: Set up,Sitting Upper Body Bathing: Set up,Minimal assistance,Sitting Lower Body Bathing: Moderate assistance,+2 for physical assistance,Sit to/from stand Upper Body Dressing : Minimal assistance,Sitting Lower Body Dressing: Moderate assistance,+2 for physical assistance Toilet Transfer: Minimal assistance,+2 for physical assistance,+2 for safety/equipment,RW (simulated to recliner) Toilet Transfer Details (indicate cue type and reason): Min A +2 to power up into standing. Cues for hand placement Toileting- Clothing Manipulation and Hygiene: Moderate  assistance,+2 for physical assistance,Sit to/from stand Functional mobility during ADLs: Minimal assistance,+2 for physical assistance,+2 for safety/equipment,Rolling walker (stand pivot) General ADL Comments: Pt presenting with poor balance and strength. Performing stand pivot to reclienr with +2.   Cognition: Cognition Overall Cognitive Status: Impaired/Different from baseline Orientation Level: Oriented X4 Cognition Arousal/Alertness: Awake/alert Behavior During Therapy: WFL for tasks assessed/performed,Anxious Overall Cognitive Status: Impaired/Different from baseline Area of Impairment: Memory,Safety/judgement Memory: Decreased short-term memory Safety/Judgement: Decreased awareness of safety,Decreased awareness of deficits General Comments: Pt very logical with processing, likes to know exactly what the plan is for all therapy/mobility. repeated education at times to complete task or to explain recovery process. Also repeating certain information and questions demonstrating poor ST memory.   Physical Exam: Blood pressure (!) 114/57, pulse 83, temperature 98.3 F (36.8 C), temperature source Oral, resp. rate 15, height 5\' 9"  (1.753 m), weight  74.4 kg, SpO2 99 %. Physical Exam Constitutional:      General: He is not in acute distress.    Appearance: He is not toxic-appearing.     Comments: Frail-appearing  HENT:     Head: Normocephalic and atraumatic.     Right Ear: External ear normal.     Left Ear: External ear normal.     Nose: Nose normal.     Mouth/Throat:     Mouth: Mucous membranes are moist.     Pharynx: Oropharynx is clear.  Eyes:     General: No scleral icterus.    Extraocular Movements: Extraocular movements intact.     Pupils: Pupils are equal, round, and reactive to light.  Cardiovascular:     Rate and Rhythm: Normal rate and regular rhythm.     Heart sounds: No murmur heard. No gallop.   Pulmonary:     Effort: Pulmonary effort is normal. No respiratory distress.     Breath sounds: No wheezing.  Abdominal:     General: Bowel sounds are normal. There is no distension.     Palpations: Abdomen is soft.  Musculoskeletal:     Cervical back: Normal range of motion and neck supple. No rigidity.     Comments: Right lower extremity with distal edema.  Leg fairly well shaped.  Skin:    Comments: Right BKA incision clean dry and intact without staples.  No dressing is in place.  Left heel notable for dry skin and signs of old, healed wound.  Heel was slightly reddened but no signs of breakdown.  Neurological:     General: No focal deficit present.     Mental Status: He is oriented to person, place, and time.     Cranial Nerves: No cranial nerve deficit.     Deep Tendon Reflexes: Reflexes normal.     Comments: Upper extremity strength grossly 5 out of 5.  Right lower extremity is 3 out of 5 at the hip and knee grossly.  Left lower extremity is 3 out of 5 proximal to 4+ to 5 out of 5 distally.  Is decreased sensation to light touch in the distal left lower extremity.  Psychiatric:     Comments: Patient is cooperative but very flat and makes infrequent eye contact        Lab Results Last 48 Hours        Results  for orders placed or performed during the hospital encounter of 03/07/21 (from the past 48 hour(s))  Glucose, capillary     Status: Abnormal    Collection Time: 03/14/21  9:43 PM  Result Value  Ref Range    Glucose-Capillary 174 (H) 70 - 99 mg/dL      Comment: Glucose reference range applies only to samples taken after fasting for at least 8 hours.  CBC     Status: Abnormal    Collection Time: 03/15/21  2:49 AM  Result Value Ref Range    WBC 6.0 4.0 - 10.5 K/uL    RBC 2.97 (L) 4.22 - 5.81 MIL/uL    Hemoglobin 9.2 (L) 13.0 - 17.0 g/dL    HCT 28.7 (L) 39.0 - 52.0 %    MCV 96.6 80.0 - 100.0 fL    MCH 31.0 26.0 - 34.0 pg    MCHC 32.1 30.0 - 36.0 g/dL    RDW 13.3 11.5 - 15.5 %    Platelets 208 150 - 400 K/uL    nRBC 0.0 0.0 - 0.2 %      Comment: Performed at [facility], [place] [address], [place], [place] [address]  Magnesium     Status: None    Collection Time: 03/15/21  2:49 AM  Result Value Ref Range    Magnesium 1.7 1.7 - 2.4 mg/dL      Comment: Performed at [facility] [address], [place], [place] [address]  Phosphorus     Status: None    Collection Time: 03/15/21  2:49 AM  Result Value Ref Range    Phosphorus 2.6 2.5 - 4.6 mg/dL      Comment: Performed at [facility] [address], [place], [place] [address]  Basic metabolic panel     Status: Abnormal    Collection Time: 03/15/21  2:49 AM  Result Value Ref Range    Sodium 137 135 - 145 mmol/L    Potassium 3.9 3.5 - 5.1 mmol/L    Chloride 102 98 - 111 mmol/L    CO2 27 22 - 32 mmol/L    Glucose, Bld 241 (H) 70 - 99 mg/dL      Comment: Glucose reference range applies only to samples taken after fasting for at least 8 hours.    BUN 18 8 - 23 mg/dL    Creatinine, Ser 3.65 (H) 0.61 - 1.24 mg/dL      Comment: DELTA CHECK NOTED    Calcium 7.7 (L) 8.9 - 10.3 mg/dL    GFR, Estimated 18 (L) >60 mL/min      Comment: (NOTE) Calculated using the CKD-EPI Creatinine Equation (2021)      Anion gap 8 5 - 15       Comment: Performed at [facility] [address] [place], [place] [address]  Glucose, capillary     Status: Abnormal    Collection Time: 03/15/21  7:56 AM  Result Value Ref Range    Glucose-Capillary 209 (H) 70 - 99 mg/dL      Comment: Glucose reference range applies only to samples taken after fasting for at least 8 hours.  Iron and TIBC     Status: Abnormal    Collection Time: 03/15/21  7:58 AM  Result Value Ref Range    Iron 36 (L) 45 - 182 ug/dL    TIBC 109 (L) 250 - 450 ug/dL    Saturation Ratios 33 17.9 - 39.5 %    UIBC 73 ug/dL      Comment: Performed at [facility] [address], [place], [place] [address]  Glucose, capillary     Status: None    Collection Time: 03/15/21  11:53 AM  Result Value Ref Range    Glucose-Capillary 92 70 - 99 mg/dL      Comment: Glucose reference range applies only to samples taken after fasting for at least 8 hours.  Glucose, capillary     Status: Abnormal    Collection Time: 03/15/21  4:48 PM  Result Value Ref Range    Glucose-Capillary 220 (H) 70 - 99 mg/dL      Comment: Glucose reference range applies only to samples taken after fasting for at least 8 hours.  Glucose, capillary     Status: Abnormal    Collection Time: 03/15/21  9:11 PM  Result Value Ref Range    Glucose-Capillary 142 (H) 70 - 99 mg/dL      Comment: Glucose reference range applies only to samples taken after fasting for at least 8 hours.  Basic metabolic panel     Status: Abnormal    Collection Time: 03/16/21  2:39 AM  Result Value Ref Range    Sodium 138 135 - 145 mmol/L    Potassium 4.0 3.5 - 5.1 mmol/L    Chloride 102 98 - 111 mmol/L    CO2 28 22 - 32 mmol/L    Glucose, Bld 141 (H) 70 - 99 mg/dL      Comment: Glucose reference range applies only to samples taken after fasting for at least 8 hours.    BUN 27 (H) 8 - 23 mg/dL    Creatinine, Ser 5.64 (H) 0.61 - 1.24 mg/dL      Comment: DELTA CHECK NOTED    Calcium 7.6 (L) 8.9 - 10.3 mg/dL    GFR,  Estimated 11 (L) >60 mL/min      Comment: (NOTE) Calculated using the CKD-EPI Creatinine Equation (2021)      Anion gap 8 5 - 15      Comment: Performed at [facility] [address], [place], [place] [address]  CBC     Status: Abnormal    Collection Time: 03/16/21  2:39 AM  Result Value Ref Range    WBC 5.6 4.0 - 10.5 K/uL    RBC 2.75 (L) 4.22 - 5.81 MIL/uL    Hemoglobin 8.5 (L) 13.0 - 17.0 g/dL    HCT 26.8 (L) 39.0 - 52.0 %    MCV 97.5 80.0 - 100.0 fL    MCH 30.9 26.0 - 34.0 pg    MCHC 31.7 30.0 - 36.0 g/dL    RDW 13.7 11.5 - 15.5 %    Platelets 223 150 - 400 K/uL    nRBC 0.0 0.0 - 0.2 %      Comment: Performed at [facility], [place] [address], [place], [place] [address]  Magnesium     Status: None    Collection Time: 03/16/21  2:39 AM  Result Value Ref Range    Magnesium 1.8 1.7 - 2.4 mg/dL      Comment: Performed at [facility] [address], [place], [place] [address]  Glucose, capillary     Status: Abnormal    Collection Time: 03/16/21  7:38 AM  Result Value Ref Range    Glucose-Capillary 198 (H) 70 - 99 mg/dL      Comment: Glucose reference range applies only to samples taken after fasting for at least 8 hours.  Glucose, capillary     Status: Abnormal    Collection Time: 03/16/21 11:32 AM  Result Value Ref Range    Glucose-Capillary 173 (H) 70 - 99  mg/dL      Comment: Glucose reference range applies only to samples taken after fasting for at least 8 hours.      Imaging Results (Last 48 hours)  No results found.           Medical Problem List and Plan: 1.  Functional and mobility deficits secondary to gangrene of the right foot leading to a right below-knee amputation             -patient may shower if the right below-knee amputation site is were approved             -ELOS/Goals: Supervision to modified independent goals for PT and OT with length of stay 7 to 10 days 2.  Antithrombotics: -DVT/anticoagulation:  Pharmaceutical: Heparin 5000  units every 8 hours             -antiplatelet therapy: 81 mg of aspirin daily, Plavix 75 mg daily 3. Pain Management: Tylenol for mild pain             -Oxycodone for moderate to severe pain             -Robaxin as needed for spasms             -Gabapentin 100 mg daily for phantom limb pain             -Discussed limb massage and desensitization activities as well as visual therapy with patient 4. Mood: Team to provide ego support as needed             -antipsychotic agents: N/AA             -He does appear at risk for depression 5. Neuropsych: This patient is capable of making decisions on his own behalf. 6. Skin/Wound Care: Apply dry dressing with Ace wrap to right below knee amputation stump.  Float left heel to prevent breakdown 7. Fluids/Electrolytes/Nutrition: Encourage adequate p.o.  We discussed today the importance of his nutrition as a reflects upon his strength and wound healing.             -Patient with low albumin: Continue daily supplements 8.  End-stage renal disease on hemodialysis.             -Patient on a Monday Wednesday Friday schedule at this point             -Dialysis to be scheduled after therapy to maximize therapy participation and tolerance 9.  Hypotension: Observe blood pressures while on the unit and especially after hemodialysis days 10.  Acute on chronic anemia: Patient on Aranesp weekly.  Monitor serial hemoglobins with dialysis 11.  Type 2 diabetes.  Borderline control at present             -Continue NovoLog 3 units with meals as well as Lantus 10 units daily             -CBGs before meals and at bedtime with sliding scale insulin             -Adjust regimen as needed             Meredith Staggers, MD 03/16/2021

## 2021-03-16 NOTE — Progress Notes (Signed)
Report called to [facility] RN on 4MW for patient to be transferred to CIR room 7.  All questions answered.  Patient's belongings all accounted for by patient.  Patient transferred to rehab via bed by Nelida Gores (SWOT)

## 2021-03-16 NOTE — Progress Notes (Signed)
PROGRESS NOTE  Duane Hall FBP:[id number] DOB: 03-Sep-1956 DOA: 03/07/2021 PCP: Pieter Partridge, PA   LOS: 9 days   Brief narrative:  Duane Hall is a 65 years old male with history of end-stage renal disease on hemodialysis, type 2 diabetes, peripheral vascular disease who had undergone amputation on 01/12/21 due to gangrene, being followed by wound care as outpatient, presented to the hospital with increasing redness and edema at the amputation site.  Patient had chills dizziness, sweatiness during hemodialysis session and was noted to be febrile and confused so he was sent into the hospital.  In the ED, patient was noted to be hypotensive, tachycardic with a fever of 103F.  He was then admitted to the ICU for further evaluation and treatment secondary to severe sepsis.  Patient was subsequently transferred out of the ICU.  Seen by vascular surgery and patient underwent below-knee amputation on the right side on 03/13/2021.  Postop, patient was put on Dilaudid pump for adequate pain management.  At this time, patient is being considered for CIR  Assessment/Plan:  Principal Problem:   Sepsis ([place]) Active Problems:   Anemia of chronic disease   Hypokalemia   Diabetic foot ulcer (HCC)   Benign prostatic hyperplasia with weak urinary stream   ESRD (end stage renal disease) ([place])   Uncontrolled type 2 diabetes mellitus with hyperglycemia ([place])   Osteomyelitis of right foot (HCC)   Gangrene (HCC)  Severe sepsis secondary to right foot gangrene/osteomyelitis Right foot On vancomycin and meropenem.  Patient underwent below-knee amputation with vascular surgery on 03/13/2021.  Restarted on Plavix.  No antibiotics as per vascular surgery.  Currently on Dilaudid pump for pain relief but has been improving.  Has been started on oral oxycodone.  Try to wean off Dilaudid pump today if possible  ESRD on hemodialysis at home. Hypotensive with 4 x weekly HD at home. Nephrology on board for  hemodialysis.  Peripheral Vascular Disease Patient with recent balloon angioplasty and atherectomy 12/27 of right SFA and posterior tibial artery.  Vascular surgery on board.  Continue aspirin and statin.    Diarrhea  Likely antibiotic associated.  Has improved.  On Imodium as needed.    Continue probiotic.  Antibiotics has been discontinued at this time after BKA.  Diabetes mellitus type II with hyperglycemia On insulin pump at home which has been disconnected.  Currently on Lantus, mealtime insulin, sliding-scale insulin.   Continue diabetic diet, Accu-Cheks, closely monitor blood glucose levels.  Latest POC glucose of 198  Hypokalemia, resolved  Hyponatremia Resolved.  Anemia of Chronic Disease Could resume iron supplement since he is taken off antibiotic and infection is gone.  Transfuse for hemoglobin less than 7.  Latest hemoglobin of 8.5  Tobacco abuse Counseling done.  Acute on chronic pain on low-dose gabapentin; Percocet at home.  Currently, on Dilaudid pump after amputation.  Patient has been started on oral oxycodone to try to wean Dilaudid pump  Deconditionoing Physical therapy and Occupational Therapy on board who recommend CIR   DVT prophylaxis: heparin injection 5,000 Units Start: 03/08/21 1400  Code Status: Full code  Family Communication:  Spoke with the patient's spouse at bedside at length.  Status is: Inpatient  Remains inpatient appropriate because:IV treatments appropriate due to intensity of illness or inability to take PO, Inpatient level of care appropriate due to severity of illness, status post amputation on IV narcotics   Dispo: The patient is from: Home  Anticipated d/c is to:  CIR as per PT evaluation              Patient currently is  medically stable to d/c.   Difficult to place patient No  Consultants:  PCCM  Podiatry  Vascular surgery  Procedures: below-knee amputation with vascular surgery on  03/13/2021  Anti-infectives:  . None  Subjective: Patient was seen and examined at bedside.  Denies overt pain.  Denies any nausea vomiting fever or chills.  Has not had a bowel movement today.      Objective: Vitals:   03/16/21 0404 03/16/21 0741  BP:  (!) 113/59  Pulse:  88  Resp: 15 16  Temp:  98.4 F (36.9 C)  SpO2: 99% 100%    Intake/Output Summary (Last 24 hours) at 03/16/2021 1008 Last data filed at 03/15/2021 1230 Gross per 24 hour  Intake 120 ml  Output -  Net 120 ml   Filed Weights   03/07/21 1943 03/14/21 1522  Weight: 75 kg 74.4 kg   Body mass index is 24.22 kg/m.   Physical Exam: General:  Average built, not in obvious distress, chronically ill, alert awake communicative, HENT:   No scleral pallor or icterus noted. Oral mucosa is moist.  Chest:   Diminished breath sounds bilaterally. No crackles or wheezes.  CVS: S1 &S2 heard. No murmur.  Regular rate and rhythm. Abdomen: Soft, nontender, nondistended.  Bowel sounds are heard.   Extremities: No cyanosis, clubbing or edema.  Status post right below knee amputation.  Left upper extremity AV fistula in place Psych: Alert, awake and oriented CNS:  No cranial nerve deficits.  Moves all extremities  Skin: Right lower extremity below-knee amputation.  Data Review: I have personally reviewed the following laboratory data and studies,  CBC: Recent Labs  Lab 03/12/21 0151 03/13/21 0536 03/15/21 0249 03/16/21 0239  WBC 7.3 7.9 6.0 5.6  HGB 8.8* 8.7* 9.2* 8.5*  HCT 26.7* 27.8* 28.7* 26.8*  MCV 94.3 97.2 96.6 97.5  PLT 167 209 208 431   Basic Metabolic Panel: Recent Labs  Lab 03/10/21 0100 03/12/21 0151 03/13/21 0536 03/15/21 0249 03/16/21 0239  NA 134* 133* 135 137 138  K 3.8 3.8 4.0 3.9 4.0  CL 99 98 98 102 102  CO2 26 24 24 27 28   GLUCOSE 198* 164* 136* 241* 141*  BUN 22 54* 66* 18 27*  CREATININE 4.48* 7.46* 9.23* 3.65* 5.64*  CALCIUM 8.3* 8.0* 8.3* 7.7* 7.6*  MG  --  1.7 1.9 1.7 1.8  PHOS   --   --  2.8 2.6  --    Liver Function Tests: No results for input(s): AST, ALT, ALKPHOS, BILITOT, PROT, ALBUMIN in the last 168 hours. No results for input(s): LIPASE, AMYLASE in the last 168 hours. No results for input(s): AMMONIA in the last 168 hours. Cardiac Enzymes: No results for input(s): CKTOTAL, CKMB, CKMBINDEX, TROPONINI in the last 168 hours. BNP (last 3 results) No results for input(s): BNP in the last 8760 hours.  ProBNP (last 3 results) No results for input(s): PROBNP in the last 8760 hours.  CBG: Recent Labs  Lab 03/15/21 0756 03/15/21 1153 03/15/21 1648 03/15/21 2111 03/16/21 0738  GLUCAP 209* 92 220* 142* 198*   Recent Results (from the past 240 hour(s))  Blood Culture (routine x 2)     Status: None   Collection Time: 03/07/21  8:22 PM   Specimen: BLOOD  Result Value Ref Range Status   Specimen Description BLOOD SITE  NOT SPECIFIED  Final   Special Requests   Final    BOTTLES DRAWN AEROBIC AND ANAEROBIC Blood Culture adequate volume   Culture   Final    NO GROWTH 5 DAYS Performed at [facility], [address] [place], [place] [address]    Report Status 03/13/2021 FINAL  Final  Blood Culture (routine x 2)     Status: None   Collection Time: 03/07/21  8:46 PM   Specimen: BLOOD LEFT HAND  Result Value Ref Range Status   Specimen Description BLOOD LEFT HAND  Final   Special Requests   Final    BOTTLES DRAWN AEROBIC AND ANAEROBIC Blood Culture adequate volume   Culture   Final    NO GROWTH 5 DAYS Performed at [facility], [place] [address], [place], [place] [address]    Report Status 03/13/2021 FINAL  Final  Resp Panel by RT-PCR (Flu A&B, Covid) Nasopharyngeal Swab     Status: None   Collection Time: 03/08/21  2:35 AM   Specimen: Nasopharyngeal Swab; Nasopharyngeal(NP) swabs in vial transport medium  Result Value Ref Range Status   SARS Coronavirus 2 by RT PCR NEGATIVE NEGATIVE Final    Comment: (NOTE) SARS-CoV-2 target nucleic acids  are NOT DETECTED.  The SARS-CoV-2 RNA is generally detectable in upper respiratory specimens during the acute phase of infection. The lowest concentration of SARS-CoV-2 viral copies this assay can detect is 138 copies/mL. A negative result does not preclude SARS-Cov-2 infection and should not be used as the sole basis for treatment or other patient management decisions. A negative result may occur with  improper specimen collection/handling, submission of specimen other than nasopharyngeal swab, presence of viral mutation(s) within the areas targeted by this assay, and inadequate number of viral copies(<138 copies/mL). A negative result must be combined with clinical observations, patient history, and epidemiological information. The expected result is Negative.  Fact Sheet for Patients:  EntrepreneurPulse.com.au  Fact Sheet for Healthcare Providers:  IncredibleEmployment.be  This test is no t yet approved or cleared by the [place] FDA and  has been authorized for detection and/or diagnosis of SARS-CoV-2 by FDA under an Emergency Use Authorization (EUA). This EUA will remain  in effect (meaning this test can be used) for the duration of the COVID-19 declaration under Section 564(b)(1) of the Act, 21 U.S.C.section 360bbb-3(b)(1), unless the authorization is terminated  or revoked sooner.       Influenza A by PCR NEGATIVE NEGATIVE Final   Influenza B by PCR NEGATIVE NEGATIVE Final    Comment: (NOTE) The Xpert Xpress SARS-CoV-2/FLU/RSV plus assay is intended as an aid in the diagnosis of influenza from Nasopharyngeal swab specimens and should not be used as a sole basis for treatment. Nasal washings and aspirates are unacceptable for Xpert Xpress SARS-CoV-2/FLU/RSV testing.  Fact Sheet for Patients: EntrepreneurPulse.com.au  Fact Sheet for Healthcare Providers: IncredibleEmployment.be  This test is  not yet approved or cleared by the [place] FDA and has been authorized for detection and/or diagnosis of SARS-CoV-2 by FDA under an Emergency Use Authorization (EUA). This EUA will remain in effect (meaning this test can be used) for the duration of the COVID-19 declaration under Section 564(b)(1) of the Act, 21 U.S.C. section 360bbb-3(b)(1), unless the authorization is terminated or revoked.  Performed at [facility], [place] [address], [place], [place] [address]   MRSA PCR Screening     Status: None   Collection Time: 03/08/21  6:40 PM  Specimen: Nasal Mucosa; Nasopharyngeal  Result Value Ref Range Status   MRSA by PCR NEGATIVE NEGATIVE Final    Comment:        The GeneXpert MRSA Assay (FDA approved for NASAL specimens only), is one component of a comprehensive MRSA colonization surveillance program. It is not intended to diagnose MRSA infection nor to guide or monitor treatment for MRSA infections. Performed at [facility], [place] [address], [place], [place] [address]   Gastrointestinal Panel by PCR , Stool     Status: None   Collection Time: 03/09/21  3:52 AM   Specimen: STOOL  Result Value Ref Range Status   Campylobacter species NOT DETECTED NOT DETECTED Final   Plesimonas shigelloides NOT DETECTED NOT DETECTED Final   Salmonella species NOT DETECTED NOT DETECTED Final   Yersinia enterocolitica NOT DETECTED NOT DETECTED Final   Vibrio species NOT DETECTED NOT DETECTED Final   Vibrio cholerae NOT DETECTED NOT DETECTED Final   Enteroaggregative E coli (EAEC) NOT DETECTED NOT DETECTED Final   Enteropathogenic E coli (EPEC) NOT DETECTED NOT DETECTED Final   Enterotoxigenic E coli (ETEC) NOT DETECTED NOT DETECTED Final   Shiga like toxin producing E coli (STEC) NOT DETECTED NOT DETECTED Final   Shigella/Enteroinvasive E coli (EIEC) NOT DETECTED NOT DETECTED Final   Cryptosporidium NOT DETECTED NOT DETECTED Final   Cyclospora cayetanensis NOT DETECTED NOT  DETECTED Final   Entamoeba histolytica NOT DETECTED NOT DETECTED Final   Giardia lamblia NOT DETECTED NOT DETECTED Final   Adenovirus F40/41 NOT DETECTED NOT DETECTED Final   Astrovirus NOT DETECTED NOT DETECTED Final   Norovirus GI/GII NOT DETECTED NOT DETECTED Final   Rotavirus A NOT DETECTED NOT DETECTED Final   Sapovirus (I, II, IV, and V) NOT DETECTED NOT DETECTED Final    Comment: Performed at [facility], [address] [place], [place] [address]  C Difficile Quick Screen w PCR reflex     Status: None   Collection Time: 03/09/21  3:52 AM   Specimen: STOOL  Result Value Ref Range Status   C Diff antigen NEGATIVE NEGATIVE Final   C Diff toxin NEGATIVE NEGATIVE Final   C Diff interpretation No C. difficile detected.  Final    Comment: Performed at [facility], [place] [address] [place], [place] [address]     Studies: No results found.   Flora Lipps, MD  [facility] 03/16/2021  If 7PM-7AM, please contact night-coverage

## 2021-03-16 NOTE — Discharge Summary (Signed)
Physician Discharge Summary  Cardell [facility] FKC:[id number] DOB: 1956-11-24 DOA: 03/07/2021  PCP: Pieter Partridge, PA  Admit date: 03/07/2021 Discharge date: 03/16/2021  Admitted From: Home  Discharge disposition: CIR   Recommendations for Outpatient Follow-Up:   . Follow up with vascular surgery as outpatient. . Follow-up with PCP as outpatient as well  Discharge Diagnosis:   Principal Problem:   Sepsis ([place]) Active Problems:   Anemia of chronic disease   Hypokalemia   Diabetic foot ulcer (HCC)   Benign prostatic hyperplasia with weak urinary stream   ESRD (end stage renal disease) ([place])   Uncontrolled type 2 diabetes mellitus with hyperglycemia ([place])   Osteomyelitis of right foot ([place])   Gangrene ([place])   Discharge Condition: Improved.  Diet recommendation: Low sodium, heart healthy.  Carbohydrate-modified.  Wound care: None.  Code status: Full.   History of Present Illness:  Duane Hall is a 65 years old male with history of end-stage renal disease on hemodialysis, type 2 diabetes, peripheral vascular disease who had undergone amputation on 01/12/21 due to gangrene, being followed by wound care as outpatient, presented to the hospital with increasing redness and edema at the amputation site.  Patient had chills dizziness, sweatiness during hemodialysis session and was noted to be febrile and confused so he was sent into the hospital.  In the ED, patient was noted to be hypotensive, tachycardic with a fever of 103F.  He was then admitted to the ICU for further evaluation and treatment secondary to severe sepsis.  Patient was subsequently transferred out of the ICU.  Seen by vascular surgery and patient underwent below-knee amputation on the right side on 03/13/2021.  Postop, patient was put on Dilaudid pump for adequate pain management.  At this time, patient is being considered for CIR.  Hospital Course:   Following conditions were addressed during hospitalization  as listed below,  Severe sepsis secondary to right foot gangrene/osteomyelitis right foot Patient initially received vancomycin and meropenem.  Patient underwent below-knee amputation with vascular surgery on 03/13/2021.  Restarted on Plavix.  No further antibiotics were recommended antibiotics as per vascular surgery.    Was initially on Dilaudid pump which has been discontinued.  Currently on oxycodone and as needed Dilaudid for pain.  Patient has not required much IV narcotics at this time.    ESRD on hemodialysis at home. Hypotensive with 4 x weekly HD at home.   Peripheral Vascular Disease Patient with recent balloon angioplasty and atherectomy 12/27 of right SFA and posterior tibial artery.  Vascular surgery follow the patient during hospitalization. Continue aspirin, Plavix and statin on discharge..    Diarrhea Likely antibiotic associated.  Has improved.  On Imodium as needed.    Continue probiotic.  Antibiotics has been discontinued at this time after BKA.  Patient has not had a bowel movement today.  Diabetesmellitus type II with hyperglycemia On insulin pump at home which will be resumed on discharge.  Latest POC glucose of 198  Hypokalemia, resolved  Hyponatremia Resolved.  Anemia of Chronic Disease Could resume iron supplement since he is taken off antibiotic and infection is gone.  Transfuse for hemoglobin less than 7.  Latest hemoglobin of 8.5  Tobacco abuse Counseling done.  Acute on chronic pain on low-dose gabapentin; Percocet at home.    Received Dilaudid pump after amputation.  Patient has been started on oral oxycodone.  Dose of gabapentin was decreased during hospitalization.  Deconditionoing Physical therapy and Occupational Therapy on board who recommend CIR  Disposition.  At this time, patient is stable for disposition to CIR.  Patient will need to follow-up with vascular surgery and primary care provider as outpatient.  Medical Consultants:     PCCM  Podiatry  Vascular surgery Procedures:    Below-knee amputation with vascular surgery on 03/13/2021 Subjective:   Today, patient was seen and examined at bedside.  Denies overt pain.  Denies any nausea, vomiting fever or chills.  Has not had a bowel movement today.      Discharge Exam:   Vitals:   03/16/21 0918 03/16/21 1133  BP:  (!) 114/57  Pulse:  83  Resp: 16 15  Temp:  98.3 F (36.8 C)  SpO2: 99% 99%   Vitals:   03/16/21 0404 03/16/21 0741 03/16/21 0918 03/16/21 1133  BP:  (!) 113/59  (!) 114/57  Pulse:  88  83  Resp: 15 16 16 15   Temp:  98.4 F (36.9 C)  98.3 F (36.8 C)  TempSrc:  Oral  Oral  SpO2: 99% 100% 99% 99%  Weight:      Height:       General:  Average built, not in obvious distress, chronically ill, alert awake communicative, HENT:   No scleral pallor or icterus noted. Oral mucosa is moist.  Chest:   Diminished breath sounds bilaterally. No crackles or wheezes.  CVS: S1 &S2 heard. No murmur.  Regular rate and rhythm. Abdomen: Soft, nontender, nondistended.  Bowel sounds are heard.   Extremities: No cyanosis, clubbing or edema.  Status post right below knee amputation.  Left upper extremity AV fistula in place Psych: Alert, awake and oriented CNS:  No cranial nerve deficits.  Moves all extremities  Skin: Right lower extremity below-knee amputation.  The results of significant diagnostics from this hospitalization (including imaging, microbiology, ancillary and laboratory) are listed below for reference.     Diagnostic Studies:   MR HEEL RIGHT WO CONTRAST  Result Date: 03/08/2021 CLINICAL DATA:  Diabetic foot ulcers.  Prior amputation. EXAM: MRI OF THE RIGHT FOREFOOT WITHOUT CONTRAST; MR OF THE RIGHT HEEL WITHOUT CONTRAST TECHNIQUE: Multiplanar, multisequence MR imaging of the right foot was performed. No intravenous contrast was administered. COMPARISON:  Right foot x-rays from yesterday. FINDINGS: Despite efforts by the technologist and  patient, motion artifact is present on today's exam and could not be eliminated. This reduces exam sensitivity and specificity. Bones/Joint/Cartilage Prior first ray amputation. There is subtle erosion of the dorsal aspect of the residual first metatarsal base with small amount of adjacent fluid (series 9, image 11). Diffuse patchy, mostly periarticular marrow edema involving the proximal phalanges, metatarsals, and tarsal bones. There are subacute mildly impacted fractures of the second and third metatarsal necks. No dislocation. No joint effusion. Ligaments Toe collateral ligaments are intact. Lisfranc ligament is intact. Medial and lateral ankle ligaments are intact. Muscles and Tendons Flexor, extensor, peroneal, and Achilles tendons are intact. Increased T2 signal within the intrinsic muscles of the forefoot, nonspecific, but likely related to diabetic muscle changes. Soft tissue Large soft tissue ulceration at the great toe amputation site with several small foci of subcutaneous emphysema. Mild dorsal forefoot and bimalleolar soft tissue swelling. No fluid collection or hematoma. No soft tissue mass. IMPRESSION: 1. Large soft tissue ulceration at the great toe amputation site with small amount of subcutaneous emphysema. Subtle erosion of the dorsal aspect of the residual first metatarsal base with small amount of adjacent fluid, concerning for early osteomyelitis. No abscess. 2. Subacute mildly impacted fractures of the  second and third metatarsal necks. 3. Diffuse patchy, mostly periarticular marrow edema involving the proximal phalanges, metatarsals, and tarsal bones. Suspect neuropathic arthropathy. Electronically Signed   By: Titus Dubin M.D.   On: 03/08/2021 15:55   MR FOOT RIGHT WO CONTRAST  Result Date: 03/08/2021 CLINICAL DATA:  Diabetic foot ulcers.  Prior amputation. EXAM: MRI OF THE RIGHT FOREFOOT WITHOUT CONTRAST; MR OF THE RIGHT HEEL WITHOUT CONTRAST TECHNIQUE: Multiplanar, multisequence  MR imaging of the right foot was performed. No intravenous contrast was administered. COMPARISON:  Right foot x-rays from yesterday. FINDINGS: Despite efforts by the technologist and patient, motion artifact is present on today's exam and could not be eliminated. This reduces exam sensitivity and specificity. Bones/Joint/Cartilage Prior first ray amputation. There is subtle erosion of the dorsal aspect of the residual first metatarsal base with small amount of adjacent fluid (series 9, image 11). Diffuse patchy, mostly periarticular marrow edema involving the proximal phalanges, metatarsals, and tarsal bones. There are subacute mildly impacted fractures of the second and third metatarsal necks. No dislocation. No joint effusion. Ligaments Toe collateral ligaments are intact. Lisfranc ligament is intact. Medial and lateral ankle ligaments are intact. Muscles and Tendons Flexor, extensor, peroneal, and Achilles tendons are intact. Increased T2 signal within the intrinsic muscles of the forefoot, nonspecific, but likely related to diabetic muscle changes. Soft tissue Large soft tissue ulceration at the great toe amputation site with several small foci of subcutaneous emphysema. Mild dorsal forefoot and bimalleolar soft tissue swelling. No fluid collection or hematoma. No soft tissue mass. IMPRESSION: 1. Large soft tissue ulceration at the great toe amputation site with small amount of subcutaneous emphysema. Subtle erosion of the dorsal aspect of the residual first metatarsal base with small amount of adjacent fluid, concerning for early osteomyelitis. No abscess. 2. Subacute mildly impacted fractures of the second and third metatarsal necks. 3. Diffuse patchy, mostly periarticular marrow edema involving the proximal phalanges, metatarsals, and tarsal bones. Suspect neuropathic arthropathy. Electronically Signed   By: Titus Dubin M.D.   On: 03/08/2021 15:55   DG Chest Port 1 View  Result Date:  03/07/2021 CLINICAL DATA:  Fever. EXAM: PORTABLE CHEST 1 VIEW COMPARISON:  01/11/2021 FINDINGS: Upper normal heart size. Unchanged mediastinal contours. Aortic atherosclerosis. No focal airspace disease. No pleural effusion or pneumothorax. No acute osseous abnormalities are seen. IMPRESSION: No acute abnormality or evidence of pneumonia. Electronically Signed   By: Keith Rake M.D.   On: 03/07/2021 20:54   DG Foot 2 Views Right  Result Date: 03/07/2021 CLINICAL DATA:  Fevers EXAM: RIGHT FOOT - 2 VIEW COMPARISON:  01/12/2021 FINDINGS: Previous transmetatarsal amputation of the first metatarsal is noted. Fractures involving the second and third metatarsal heads are seen. Soft tissue irregularity is noted at the amputation site although no definitive bony erosive changes are seen. IMPRESSION: Prior surgical changes with soft tissue wound. No findings to suggest osteomyelitis are seen. Fractures of the distal aspect of the second and third metatarsals are seen new from the prior exam. Electronically Signed   By: Inez Catalina M.D.   On: 03/07/2021 20:56     Labs:   Basic Metabolic Panel: Recent Labs  Lab 03/10/21 0100 03/12/21 0151 03/13/21 0536 03/15/21 0249 03/16/21 0239  NA 134* 133* 135 137 138  K 3.8 3.8 4.0 3.9 4.0  CL 99 98 98 102 102  CO2 26 24 24 27 28   GLUCOSE 198* 164* 136* 241* 141*  BUN 22 54* 66* 18 27*  CREATININE 4.48* 7.46*  9.23* 3.65* 5.64*  CALCIUM 8.3* 8.0* 8.3* 7.7* 7.6*  MG  --  1.7 1.9 1.7 1.8  PHOS  --   --  2.8 2.6  --    GFR Estimated Creatinine Clearance: 13.4 mL/min (A) (by C-G formula based on SCr of 5.64 mg/dL (H)). Liver Function Tests: No results for input(s): AST, ALT, ALKPHOS, BILITOT, PROT, ALBUMIN in the last 168 hours. No results for input(s): LIPASE, AMYLASE in the last 168 hours. No results for input(s): AMMONIA in the last 168 hours. Coagulation profile No results for input(s): INR, PROTIME in the last 168 hours.  CBC: Recent Labs  Lab  03/12/21 0151 03/13/21 0536 03/15/21 0249 03/16/21 0239  WBC 7.3 7.9 6.0 5.6  HGB 8.8* 8.7* 9.2* 8.5*  HCT 26.7* 27.8* 28.7* 26.8*  MCV 94.3 97.2 96.6 97.5  PLT 167 209 208 223   Cardiac Enzymes: No results for input(s): CKTOTAL, CKMB, CKMBINDEX, TROPONINI in the last 168 hours. BNP: Invalid input(s): POCBNP CBG: Recent Labs  Lab 03/15/21 1153 03/15/21 1648 03/15/21 2111 03/16/21 0738 03/16/21 1132  GLUCAP 92 220* 142* 198* 173*   D-Dimer No results for input(s): DDIMER in the last 72 hours. Hgb A1c No results for input(s): HGBA1C in the last 72 hours. Lipid Profile No results for input(s): CHOL, HDL, LDLCALC, TRIG, CHOLHDL, LDLDIRECT in the last 72 hours. Thyroid function studies No results for input(s): TSH, T4TOTAL, T3FREE, THYROIDAB in the last 72 hours.  Invalid input(s): FREET3 Anemia work up Recent Labs    03/15/21 0758  TIBC 109*  IRON 36*   Microbiology Recent Results (from the past 240 hour(s))  Blood Culture (routine x 2)     Status: None   Collection Time: 03/07/21  8:22 PM   Specimen: BLOOD  Result Value Ref Range Status   Specimen Description BLOOD SITE NOT SPECIFIED  Final   Special Requests   Final    BOTTLES DRAWN AEROBIC AND ANAEROBIC Blood Culture adequate volume   Culture   Final    NO GROWTH 5 DAYS Performed at [facility], [address] [place], [place] [address]    Report Status 03/13/2021 FINAL  Final  Blood Culture (routine x 2)     Status: None   Collection Time: 03/07/21  8:46 PM   Specimen: BLOOD LEFT HAND  Result Value Ref Range Status   Specimen Description BLOOD LEFT HAND  Final   Special Requests   Final    BOTTLES DRAWN AEROBIC AND ANAEROBIC Blood Culture adequate volume   Culture   Final    NO GROWTH 5 DAYS Performed at [facility], [place] [address], [place], [place] [address]    Report Status 03/13/2021 FINAL  Final  Resp Panel by RT-PCR (Flu A&B, Covid) Nasopharyngeal Swab     Status: None    Collection Time: 03/08/21  2:35 AM   Specimen: Nasopharyngeal Swab; Nasopharyngeal(NP) swabs in vial transport medium  Result Value Ref Range Status   SARS Coronavirus 2 by RT PCR NEGATIVE NEGATIVE Final    Comment: (NOTE) SARS-CoV-2 target nucleic acids are NOT DETECTED.  The SARS-CoV-2 RNA is generally detectable in upper respiratory specimens during the acute phase of infection. The lowest concentration of SARS-CoV-2 viral copies this assay can detect is 138 copies/mL. A negative result does not preclude SARS-Cov-2 infection and should not be used as the sole basis for treatment or other patient management decisions. A negative result may occur with  improper specimen collection/handling, submission of  specimen other than nasopharyngeal swab, presence of viral mutation(s) within the areas targeted by this assay, and inadequate number of viral copies(<138 copies/mL). A negative result must be combined with clinical observations, patient history, and epidemiological information. The expected result is Negative.  Fact Sheet for Patients:  EntrepreneurPulse.com.au  Fact Sheet for Healthcare Providers:  IncredibleEmployment.be  This test is no t yet approved or cleared by the [place] FDA and  has been authorized for detection and/or diagnosis of SARS-CoV-2 by FDA under an Emergency Use Authorization (EUA). This EUA will remain  in effect (meaning this test can be used) for the duration of the COVID-19 declaration under Section 564(b)(1) of the Act, 21 U.S.C.section 360bbb-3(b)(1), unless the authorization is terminated  or revoked sooner.       Influenza A by PCR NEGATIVE NEGATIVE Final   Influenza B by PCR NEGATIVE NEGATIVE Final    Comment: (NOTE) The Xpert Xpress SARS-CoV-2/FLU/RSV plus assay is intended as an aid in the diagnosis of influenza from Nasopharyngeal swab specimens and should not be used as a sole basis for treatment.  Nasal washings and aspirates are unacceptable for Xpert Xpress SARS-CoV-2/FLU/RSV testing.  Fact Sheet for Patients: EntrepreneurPulse.com.au  Fact Sheet for Healthcare Providers: IncredibleEmployment.be  This test is not yet approved or cleared by the [place] FDA and has been authorized for detection and/or diagnosis of SARS-CoV-2 by FDA under an Emergency Use Authorization (EUA). This EUA will remain in effect (meaning this test can be used) for the duration of the COVID-19 declaration under Section 564(b)(1) of the Act, 21 U.S.C. section 360bbb-3(b)(1), unless the authorization is terminated or revoked.  Performed at [facility], [place] [address], [place], [place] [address]   MRSA PCR Screening     Status: None   Collection Time: 03/08/21  6:40 PM   Specimen: Nasal Mucosa; Nasopharyngeal  Result Value Ref Range Status   MRSA by PCR NEGATIVE NEGATIVE Final    Comment:        The GeneXpert MRSA Assay (FDA approved for NASAL specimens only), is one component of a comprehensive MRSA colonization surveillance program. It is not intended to diagnose MRSA infection nor to guide or monitor treatment for MRSA infections. Performed at [facility], [place] [address] [place], [place] [address]   Gastrointestinal Panel by PCR , Stool     Status: None   Collection Time: 03/09/21  3:52 AM   Specimen: STOOL  Result Value Ref Range Status   Campylobacter species NOT DETECTED NOT DETECTED Final   Plesimonas shigelloides NOT DETECTED NOT DETECTED Final   Salmonella species NOT DETECTED NOT DETECTED Final   Yersinia enterocolitica NOT DETECTED NOT DETECTED Final   Vibrio species NOT DETECTED NOT DETECTED Final   Vibrio cholerae NOT DETECTED NOT DETECTED Final   Enteroaggregative E coli (EAEC) NOT DETECTED NOT DETECTED Final   Enteropathogenic E coli (EPEC) NOT DETECTED NOT DETECTED Final   Enterotoxigenic E coli (ETEC) NOT DETECTED  NOT DETECTED Final   Shiga like toxin producing E coli (STEC) NOT DETECTED NOT DETECTED Final   Shigella/Enteroinvasive E coli (EIEC) NOT DETECTED NOT DETECTED Final   Cryptosporidium NOT DETECTED NOT DETECTED Final   Cyclospora cayetanensis NOT DETECTED NOT DETECTED Final   Entamoeba histolytica NOT DETECTED NOT DETECTED Final   Giardia lamblia NOT DETECTED NOT DETECTED Final   Adenovirus F40/41 NOT DETECTED NOT DETECTED Final   Astrovirus NOT DETECTED NOT DETECTED Final   Norovirus GI/GII NOT DETECTED NOT DETECTED Final  Rotavirus A NOT DETECTED NOT DETECTED Final   Sapovirus (I, II, IV, and V) NOT DETECTED NOT DETECTED Final    Comment: Performed at [facility], [place], [place] [address]  C Difficile Quick Screen w PCR reflex     Status: None   Collection Time: 03/09/21  3:52 AM   Specimen: STOOL  Result Value Ref Range Status   C Diff antigen NEGATIVE NEGATIVE Final   C Diff toxin NEGATIVE NEGATIVE Final   C Diff interpretation No C. difficile detected.  Final    Comment: Performed at [facility], [place] [address] [place], [place] [address]     Discharge Instructions:   Discharge Instructions    Diet Carb Modified   Complete by: As directed    Discharge instructions   Complete by: As directed    Follow-up with vascular surgery as outpatient.  Follow up with your primary care physician after discharge.   Discharge wound care:   Complete by: As directed    Stump care.   Increase activity slowly   Complete by: As directed      Allergies as of 03/16/2021      Reactions   Amoxicillin-pot Clavulanate Diarrhea, Other (See Comments)   Midodrine Other (See Comments)   Other reaction(s): Urinary Sensation   Tape Other (See Comments)   bandaids cause blistering      Medication List    STOP taking these medications   iron sucrose in sodium chloride 0.9 % 100 mL   omeprazole 40 MG capsule Commonly known as: PRILOSEC Replaced by:  pantoprazole 40 MG tablet     TAKE these medications   acidophilus Caps capsule Take 2 capsules by mouth daily. Start taking on: March 17, 2021   aspirin 81 MG EC tablet Take 1 tablet (81 mg total) by mouth daily. Swallow whole. What changed: when to take this   clopidogrel 75 MG tablet Commonly known as: PLAVIX Take 1 tablet (75 mg total) by mouth daily with breakfast. What changed: when to take this   docusate sodium 100 MG capsule Commonly known as: COLACE Take 100 mg by mouth every morning.   ferric citrate 1 GM 210 MG(Fe) tablet Commonly known as: AURYXIA Take 210 mg by mouth 2 (two) times daily with a meal.   Fiasp 100 UNIT/ML Soln Generic drug: Insulin Aspart (w/Niacinamide) For use in pump, total of 30 units per day   FreeStyle Libre 14 Day Sensor Misc 1 Device by Other route every 14 (fourteen) days.   gabapentin 100 MG capsule Commonly known as: NEURONTIN Take 1 capsule (100 mg total) by mouth every morning. What changed: how much to take   Gvoke HypoPen 1-Pack 1 MG/0.2ML Soaj Generic drug: Glucagon Inject 1 mg into the skin once as needed (low blood sugar).   HYDROmorphone 1 MG/ML injection Commonly known as: DILAUDID Inject 0.5 mLs (0.5 mg total) into the vein every 4 (four) hours as needed for moderate pain or severe pain.   lidocaine-prilocaine cream Commonly known as: EMLA Apply 1 application topically See admin instructions. Apply topically to port access one hour prior to dialysis on Sunday, Monday, Wednesday, Thursday   loperamide 2 MG capsule Commonly known as: IMODIUM Take 1 capsule (2 mg total) by mouth every 6 (six) hours as needed for diarrhea or loose stools.   MIRCERA IJ Inject into the skin every 30 (thirty) days.   multivitamin Tabs tablet Take 1 tablet by mouth every morning.  mupirocin ointment 2 % Commonly known as: BACTROBAN Apply 1 application topically 2 (two) times daily.   OmniPod Dash 5 Pack Pods Misc Use as  instructed: use pod every 3 days.   Oxycodone HCl 10 MG Tabs Take 1 tablet (10 mg total) by mouth every 4 (four) hours as needed for moderate pain or severe pain.   pantoprazole 40 MG tablet Commonly known as: PROTONIX Take 1 tablet (40 mg total) by mouth daily. Start taking on: March 17, 2021 Replaces: omeprazole 40 MG capsule   Pen Needles 31G X 6 MM Misc 1 Device by Does not apply route in the morning, at noon, in the evening, and at bedtime.   rosuvastatin 10 MG tablet Commonly known as: CRESTOR TAKE 1 TABLET BY MOUTH DAILY What changed: when to take this              Discharge Care Instructions  (From admission, onward)         Start     Ordered   03/16/21 0000  Discharge wound care:       Comments: Stump care.   03/16/21 1429            Time coordinating discharge: 39 minutes  Signed:  Jazmin Vensel  [facility] 03/16/2021, 2:30 PM

## 2021-03-16 NOTE — Progress Notes (Signed)
Meredith Staggers, MD  Physician  Nursing  PMR Pre-admission    Signed  Date of Service:  03/16/2021 12:17 PM      Related encounter: ED to Hosp-Admission (Current) from 03/07/2021 in [place]          Show:Clear all [x] Manual[x] Template[x] Copied  Added by: [x] Mertis Mosher, Evalee Mutton, RN[x] Meredith Staggers, MD   [] Hover for details  PMR Admission Coordinator Pre-Admission Assessment  Patient: Duane Hall is an 65 y.o., male MRN: [id number] DOB: [birth year] Height: 5' 9"  (175.3 cm) Weight: 74.4 kg  Insurance Information HMO:     PPO: Yes     PCP:       IPA:       80/20:       OTHER: Group [id number] MAAG PRIMARY: Rickey Primus      Policy#: [id number]      Subscriber: Donato He[employer] CM Name: Caffie Damme      Phone#:       Fax#: [phone] Pre-Cert#: [id number]  For 7 days from 3/18 to 3/24 with update due on 03/22/21    Employer: [employer] Benefits:  Phone #: [phone]     Name:  Eff. Date: 12/30/20     Deduct: $500 (met $500)      Out of Pocket Max: $2000 (met $2000)      Life Max: N/A CIR: 90%      SNF: $100 copay per admission, then 90%/10% Outpatient: 90%     Co-Pay: 10% Home Health: 90%      Co-Pay: 10% DME: 90%     Co-Pay: 10% Providers: in network  SECONDARY:       Policy#:      Phone#:   Development worker, community:       Phone#:   The Engineer, petroleum" for patients in Inpatient Rehabilitation Facilities with attached "Privacy Act [place] Records" was provided and verbally reviewed with: N/A  Emergency Contact Information         Contact Information    Name Relation Home Work Mobile   July,Katherine Spouse [phone]  [phone]      Current Medical History  Patient Admitting Diagnosis: R BKA  History of Present Illness: A 65 y.o.malewith medical history significant ofend-stage renal disease on hemodialysis TTS, diabetes, peripheral vascular disease, history of diabetic foot  ulcers with gangrene of the right big toe status post amputation this January who is following up with podiatry. Patient apparently has had continued nonhealing ulcer at the surgical site. He continues to have drainage despite wound care. He appears to have poor circulation on the leg and also has not progressed to healing. Was seen again today by podiatry and sent to the ER. He came in and appears to be septic with infection of the ulcer. There is also evidence of dark peripheral part of the ulcer probably gangrene. X-ray showed possible pathologic fractures. Patient being admitted to the hospital with suspicion of gangrene but also sepsis due to the wound infection. He has been taking his medications and apparently following all recommendations.  He was admitted with severe sepsis secondary to right foot gangrene. MRI showing subacute mildly impacted fractures of second and third metatarsal necks. 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 Significant PMH: ESRD on hemodialysis, PVD, DM2, recent right first toe amputation 1/14.  Was evaluated by PT and OT and found to be a candidate for acute inpatient rehab admission.  Will admit to CIR at [facility] for an intensive  inpatient rehab program.   Patient's medical record from [facility] has been reviewed by the rehabilitation admission coordinator and physician.  Past Medical History      Past Medical History:  Diagnosis Date  . Asthma   . Cancer ([place])   . Cataract   . CKD (chronic kidney disease), stage III ([place])   . Diabetes mellitus   . Glaucoma   . Vascular insufficiency 05/2020    Family History   family history includes Cancer in his father; Diabetes in his mother.  Prior Rehab/Hospitalizations Has the patient had prior rehab or hospitalizations prior to admission? No  Has the patient had major surgery during 100 days prior to admission? Yes             Current Medications  Current Facility-Administered Medications:  .   acetaminophen (TYLENOL) tablet 650 mg, 650 mg, Oral, Q6H PRN, 650 mg at 03/09/21 2001 **OR** acetaminophen (TYLENOL) suppository 650 mg, 650 mg, Rectal, Q6H PRN, Jonelle Sidle, Mohammad L, MD .  acidophilus (RISAQUAD) capsule 2 capsule, 2 capsule, Oral, Daily, Pokhrel, Laxman, MD, 2 capsule at 03/16/21 0921 .  aspirin chewable tablet 81 mg, 81 mg, Oral, Daily, Julian Hy, DO, 81 mg at 03/16/21 3662 .  Chlorhexidine Gluconate Cloth 2 % PADS 6 each, 6 each, Topical, Q0600, Edrick Oh, MD, 6 each at 03/16/21 0542 .  clopidogrel (PLAVIX) tablet 75 mg, 75 mg, Oral, Daily, Pokhrel, Laxman, MD, 75 mg at 03/16/21 0920 .  [START ON 03/20/2021] Darbepoetin Alfa (ARANESP) injection 60 mcg, 60 mcg, Intravenous, Q Tue-HD, Pokhrel, Laxman, MD .  feeding supplement (BOOST / RESOURCE BREEZE) liquid 1 Container, 1 Container, Oral, TID BM, Julian Hy, DO, 1 Container at 03/14/21 1000 .  gabapentin (NEURONTIN) capsule 100 mg, 100 mg, Oral, Daily, Noemi Chapel P, DO, 100 mg at 03/16/21 0921 .  heparin injection 5,000 Units, 5,000 Units, Subcutaneous, Q8H, Elwyn Reach, MD, 5,000 Units at 03/16/21 0538 .  HYDROmorphone (DILAUDID) injection 0.5 mg, 0.5 mg, Intravenous, Q4H PRN, Pokhrel, Laxman, MD .  insulin aspart (novoLOG) injection 0-15 Units, 0-15 Units, Subcutaneous, TID WC, Pokhrel, Laxman, MD, 3 Units at 03/16/21 0919 .  insulin aspart (novoLOG) injection 0-5 Units, 0-5 Units, Subcutaneous, QHS, Elwyn Reach, MD, 2 Units at 03/13/21 2207 .  insulin aspart (novoLOG) injection 3 Units, 3 Units, Subcutaneous, TID WC, Julian Hy, DO, 3 Units at 03/14/21 1307 .  insulin glargine (LANTUS) injection 10 Units, 10 Units, Subcutaneous, Daily, Julian Hy, DO, 10 Units at 03/16/21 0920 .  loperamide (IMODIUM) capsule 2 mg, 2 mg, Oral, Q6H PRN, Pokhrel, Laxman, MD .  multivitamin (RENA-VIT) tablet 1 tablet, 1 tablet, Oral, QHS, Julian Hy, DO, 1 tablet at 03/15/21 2155 .  ondansetron (ZOFRAN) tablet 4 mg,  4 mg, Oral, Q6H PRN **OR** ondansetron (ZOFRAN) injection 4 mg, 4 mg, Intravenous, Q6H PRN, Elwyn Reach, MD, 4 mg at 03/10/21 2005 .  oxyCODONE (Oxy IR/ROXICODONE) immediate release tablet 10 mg, 10 mg, Oral, Q4H PRN, Pokhrel, Laxman, MD, 10 mg at 03/16/21 1005 .  pantoprazole (PROTONIX) EC tablet 40 mg, 40 mg, Oral, Daily, Verlon Au, Jai-Gurmukh, MD, 40 mg at 03/16/21 0921 .  prochlorperazine (COMPAZINE) injection 10 mg, 10 mg, Intravenous, Q6H PRN, Stretch, Marily Lente, MD, 10 mg at 03/10/21 2328 .  rosuvastatin (CRESTOR) tablet 10 mg, 10 mg, Oral, q morning, Samtani, Jai-Gurmukh, MD, 10 mg at 03/16/21 9476  Patients Current Diet:     Diet Order  Diet Carb Modified Fluid consistency: Thin; Room service appropriate? Yes  Diet effective now                  Precautions / Restrictions Precautions Precautions: Fall Precaution Comments: R BKA Restrictions Weight Bearing Restrictions: Yes RLE Weight Bearing: Non weight bearing Other Position/Activity Restrictions: Pt does not maintain   Has the patient had 2 or more falls or a fall with injury in the past year? No  Prior Activity Level Household: Mainly stays at home.  May go out 3 times a month.  He has his HD at home.  Prior Functional Level Self Care: Did the patient need help bathing, dressing, using the toilet or eating? Independent  Indoor Mobility: Did the patient need assistance with walking from room to room (with or without device)? Independent  Stairs: Did the patient need assistance with internal or external stairs (with or without device)? Independent  Functional Cognition: Did the patient need help planning regular tasks such as shopping or remembering to take medications? Needed some help  Home Assistive Devices / Equipment Home Equipment: Shower seat,Wheelchair - manual,Walker - 2 wheels,Walker - 4 wheels (pt states his walker "has a seat" - need clarification that he has a  RW)  Prior Device Use: Indicate devices/aids used by the patient prior to current illness, exacerbation or injury? Manual wheelchair and Walker  Current Functional Level Cognition  Overall Cognitive Status: Within Functional Limits for tasks assessed Orientation Level: Oriented X4 Safety/Judgement: Decreased awareness of safety,Decreased awareness of deficits General Comments: Pt very logical with processing, likes to know exactly what the plan is for all therapy/mobility. repeated education at times to complete task or to explain recovery process.    Extremity Assessment (includes Sensation/Coordination)  Upper Extremity Assessment: Generalized weakness  Lower Extremity Assessment: Defer to PT evaluation RLE Deficits / Details: S/p amputation. Difficult to assess due to pain, right knee extension to neutral RLE Sensation: decreased light touch LLE Deficits / Details: Grossly 3/5    ADLs  Overall ADL's : Needs assistance/impaired Eating/Feeding: Set up,Sitting Grooming: Set up,Sitting Upper Body Bathing: Set up,Minimal assistance,Sitting Lower Body Bathing: Moderate assistance,+2 for physical assistance,Sit to/from stand Upper Body Dressing : Minimal assistance,Sitting Lower Body Dressing: Moderate assistance,+2 for physical assistance Toilet Transfer: Minimal assistance,+2 for physical assistance,Stand-pivot,BSC,RW Toileting- Clothing Manipulation and Hygiene: Moderate assistance,+2 for physical assistance,Sit to/from stand Functional mobility during ADLs: Minimal assistance,Rolling walker,Cueing for safety (does not maintain WBS) General ADL Comments: Pt completed bed mobility, sat EOB a few minutes, then SPT to recliner to sit up for a bit. +2 assist needed this session during OOB transfers.    Mobility  Overal bed mobility: Needs Assistance Bed Mobility: Supine to Sit Supine to sit: Min assist Sit to supine: Supervision General bed mobility comments: minG with cues  and increased time. pt using UE to raise RLE and significant UE on bed rail to pull to sitting EOB    Transfers  Overall transfer level: Needs assistance Equipment used: Rolling walker (2 wheeled) Transfers: Sit to/from [employer] Sit to Stand: Min assist,+2 physical assistance (or modA of 1) Stand pivot transfers: Min assist,+2 physical assistance General transfer comment: minA of 2 to rise from EOB or modA of 1 with increased cues for hand positioning, LLE positioning, and posture/RLE straightening once in standing. pt using pivot on LLE initially to maneuver to recliner.    Ambulation / Gait / Stairs / Wheelchair Mobility  Ambulation/Gait Ambulation/Gait assistance: Development worker, international aid (  Feet): 3 Feet Assistive device: Rolling walker (2 wheeled) Gait Pattern/deviations: Trunk flexed General Gait Details: hop-to pattern with cueing for technique, use of UE to press up and complete hop-to pattern. Pt able to complete hop forwards and back on LLE. Gait velocity: decreased    Posture / Balance Dynamic Sitting Balance Sitting balance - Comments: no UE support needed, single LE support Balance Overall balance assessment: Needs assistance Sitting-balance support: Feet supported Sitting balance-Leahy Scale: Good Sitting balance - Comments: no UE support needed, single LE support Standing balance support: Bilateral upper extremity supported Standing balance-Leahy Scale: Poor Standing balance comment: reliant on hand support    Special needs/care consideration Dialysis: Hemodialysis Monday, Wednesday, Thursday and Sunday, Skin Dressing intack to R BKA post op site. and Diabetic management Diabetic and on insulin in the hospital.   Previous Home Environment (from acute therapy documentation) Living Arrangements: Spouse/significant other Available Help at Discharge: Family Type of Home: House Home Layout: One level Home Access: Stairs to  enter Entrance Stairs-Rails: Surveyor, mining of Steps: 7 Bathroom Shower/Tub: Multimedia programmer: Standard Bathroom Accessibility: Yes How Accessible: Accessible via walker,Accessible via wheelchair  Discharge [place] for Discharge Living Setting: Patient's home,House,Lives with (comment) (Lives with wife.  Wife works from home.) Type of Home at Discharge: House Discharge Home Layout: One level Discharge Home Access: Stairs to enter Entrance Stairs-Rails: Surveyor, mining of Steps: 7 steps at the front and 8 steps at the back entry Discharge Bathroom Shower/Tub: Walk-in shower,Door Discharge [place]: Standard Discharge [place] Accessibility: Yes How Accessible: Accessible via walker  [place] Patient Roles: Spouse Contact Information: Zahi Plaskett - wife Anticipated Caregiver: wife Anticipated Caregiver's Contact Information: Belenda Cruise [phone] Ability/Limitations of Caregiver: Wife works from home and can provide supervision and intermittent assistance Caregiver Availability: 24/7 Discharge Plan Discussed with Primary Caregiver: Yes Is Caregiver In Agreement with Plan?: Yes Does Caregiver/Family have Issues with Lodging/Transportation while Pt is in Rehab?: No  Goals Patient/Family Goal for Rehab: PT/OT mod I and supervision goals Expected length of stay: 7-10 days Pt/Family Agrees to Admission and willing to participate: Yes Program Orientation Provided & Reviewed with Pt/Caregiver Including Roles  & Responsibilities: Yes  Decrease burden of Care through IP rehab admission: N/A  Possible need for SNF placement upon discharge: Not anticipated  Patient Condition: I have reviewed medical records from [facility], spoken with CM, and patient. I met with patient at the bedside for inpatient rehabilitation assessment.  Patient will benefit from ongoing PT and OT, can  actively participate in 3 hours of therapy a day 5 days of the week, and can make measurable gains during the admission.  Patient will also benefit from the coordinated team approach during an Inpatient Acute Rehabilitation admission.  The patient will receive intensive therapy as well as Rehabilitation physician, nursing, social worker, and care management interventions.  Due to bladder management, bowel management, safety, skin/wound care, disease management, medication administration, pain management and patient education the patient requires 24 hour a day rehabilitation nursing.  The patient is currently min assist with mobility and basic ADLs.  Discharge setting and therapy post discharge at home with home health is anticipated.  Patient has agreed to participate in the Acute Inpatient Rehabilitation Program and will admit today.  Preadmission Screen Completed By:  Retta Diones, 03/16/2021 12:17 PM ______________________________________________________________________   Discussed status with Dr. Naaman Plummer on 03/16/21 at 1422 and received approval for admission today.  Admission Coordinator:  Retta Diones, RN,  time 1422/Date 03/16/21   Assessment/Plan: Diagnosis: right BKA 1. Does the need for close, 24 hr/day Medical supervision in concert with the patient's rehab needs make it unreasonable for this patient to be served in a less intensive setting? Yes 2. Co-Morbidities requiring supervision/potential complications: ESRD on HD. DM, PAD, sepsis 3. Due to bladder management, bowel management, safety, skin/wound care, disease management, medication administration, pain management and patient education, does the patient require 24 hr/day rehab nursing? Yes 4. Does the patient require coordinated care of a physician, rehab nurse, PT, OT, and SLP to address physical and functional deficits in the context of the above medical diagnosis(es)? Yes Addressing deficits in the following areas: balance,  endurance, locomotion, strength, transferring, bowel/bladder control, bathing, dressing, feeding, grooming, toileting and psychosocial support 5. Can the patient actively participate in an intensive therapy program of at least 3 hrs of therapy 5 days a week? Yes 6. The potential for patient to make measurable gains while on inpatient rehab is excellent 7. Anticipated functional outcomes upon discharge from inpatient rehab: modified independent and supervision PT, modified independent and supervision OT, n/a SLP 8. Estimated rehab length of stay to reach the above functional goals is: 7-10 days 9. Anticipated discharge destination: Home 10. Overall Rehab/Functional Prognosis: excellent   MD Signature: Meredith Staggers, MD, [facility] 03/16/2021           Revision History                                                 Note Details  Author Meredith Staggers, MD File Time 03/16/2021 2:26 PM  Author Type Physician Status Signed  Last Editor Meredith Staggers, MD Service Nursing

## 2021-03-16 NOTE — Progress Notes (Addendum)
  Progress Note    03/16/2021 7:38 AM 3 Days Post-Op  Subjective: using PCA dilaudid less yesterday and overnight   Vitals:   03/16/21 0403 03/16/21 0404  BP:    Pulse:    Resp: 15 15  Temp:    SpO2:  99%    Physical Exam: Incisions: R BKA incision c/d/i   CBC    Component Value Date/Time   WBC 5.6 03/16/2021 0239   RBC 2.75 (L) 03/16/2021 0239   HGB 8.5 (L) 03/16/2021 0239   HGB 9.5 (L) 06/26/2020 1527   HCT 26.8 (L) 03/16/2021 0239   HCT 27.6 (L) 06/26/2020 1527   PLT 223 03/16/2021 0239   PLT 203 06/26/2020 1527   MCV 97.5 03/16/2021 0239   MCV 93 06/26/2020 1527   MCH 30.9 03/16/2021 0239   MCHC 31.7 03/16/2021 0239   RDW 13.7 03/16/2021 0239   RDW 11.9 06/26/2020 1527   LYMPHSABS 0.8 03/08/2021 1203   LYMPHSABS 1.0 06/26/2020 1527   MONOABS 0.8 03/08/2021 1203   EOSABS 0.0 03/08/2021 1203   EOSABS 0.2 06/26/2020 1527   BASOSABS 0.0 03/08/2021 1203   BASOSABS 0.0 06/26/2020 1527    BMET    Component Value Date/Time   NA 138 03/16/2021 0239   K 4.0 03/16/2021 0239   CL 102 03/16/2021 0239   CO2 28 03/16/2021 0239   GLUCOSE 141 (H) 03/16/2021 0239   BUN 27 (H) 03/16/2021 0239   CREATININE 5.64 (H) 03/16/2021 0239   CREATININE 2.22 (H) 09/07/2013 0845   CALCIUM 7.6 (L) 03/16/2021 0239   GFRNONAA 11 (L) 03/16/2021 0239   GFRAA 9 (L) 07/31/2020 2100    INR    Component Value Date/Time   INR 1.4 (H) 03/08/2021 1203     Intake/Output Summary (Last 24 hours) at 03/16/2021 8891 Last data filed at 03/15/2021 1230 Gross per 24 hour  Intake 220 ml  Output --  Net 220 ml     Assessment/Plan:  65 y.o. male is s/p right below knee amputation 3 Days Post-Op  -Apply retention sock today -d/c PCA this afternoon; give 0.5-1mg  q2h prn thereafter -Ready for d/c to CIR when bed approved; call Vascular surgeon on call if questions over the weekend    Dagoberto Ligas, PA-C [facility] [phone] 03/16/2021 7:38 AM  I have  independently interviewed and examined patient and agree with PA assessment and plan above.  Needs to increase mobility of right lower extremity and I discussed this with him.  Okay for CIR from vascular standpoint  Jerritt Cardoza C. Donzetta Matters, MD [facility] [place] Office: [phone] Pager: [phone]

## 2021-03-16 NOTE — PMR Pre-admission (Signed)
PMR Admission Coordinator Pre-Admission Assessment  Patient: Duane Hall is an 65 y.o., male MRN: [id number] DOB: 1956-01-26 Height: 5' 9"  (175.3 cm) Weight: 74.4 kg  Insurance Information HMO:     PPO: Yes     PCP:       IPA:       80/20:       OTHER: Group [id number] MAAG PRIMARY: Rickey Primus      Policy#: [id number]      Subscriber: Donato Heinz CM Name: Caffie Damme      Phone#:       Fax#: [phone] Pre-Cert#: [id number]  For 7 days from 3/18 to 3/24 with update due on 03/22/21    Employer: [employer] Benefits:  Phone #: [phone]     Name:  Eff. Date: 12/30/20     Deduct: $500 (met $500)      Out of Pocket Max: $2000 (met $2000)      Life Max: N/A CIR: 90%      SNF: $100 copay per admission, then 90%/10% Outpatient: 90%     Co-Pay: 10% Home Health: 90%      Co-Pay: 10% DME: 90%     Co-Pay: 10% Providers: in network  SECONDARY:       Policy#:      Phone#:   Development worker, community:       Phone#:   The Engineer, petroleum" for patients in Inpatient Rehabilitation Facilities with attached "Privacy Act [place] Records" was provided and verbally reviewed with: N/A  Emergency Contact Information Contact Information    Name Relation Home Work Mobile   Mathe,Katherine Spouse [phone]  [phone]      Current Medical History  Patient Admitting Diagnosis: R BKA  History of Present Illness: A 65 y.o. male with medical history significant of end-stage renal disease on hemodialysis TTS, diabetes, peripheral vascular disease, history of diabetic foot ulcers with gangrene of the right big toe status post amputation this January who is following up with podiatry.  Patient apparently has had continued nonhealing ulcer at the surgical site.  He continues to have drainage despite wound care.  He appears to have poor circulation on the leg and also has not progressed to healing.  Was seen again today by podiatry and sent to the ER.  He came in and appears to be  septic with infection of the ulcer.  There is also evidence of dark peripheral part of the ulcer probably gangrene.  X-ray showed possible pathologic fractures.  Patient being admitted to the hospital with suspicion of gangrene but also sepsis due to the wound infection.  He has been taking his medications and apparently following all recommendations.  He was admitted with severe sepsis secondary to right foot gangrene. MRI showing subacute mildly impacted fractures of second and third metatarsal necks. 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 Significant PMH: ESRD on hemodialysis, PVD, DM2, recent right first toe amputation 1/14.  Was evaluated by PT and OT and found to be a candidate for acute inpatient rehab admission.  Will admit to CIR at [facility] for an intensive inpatient rehab program.   Patient's medical record from [facility] has been reviewed by the rehabilitation admission coordinator and physician.  Past Medical History  Past Medical History:  Diagnosis Date  . Asthma   . Cancer ([place])   . Cataract   . CKD (chronic kidney disease), stage III ([place])   . Diabetes mellitus   . Glaucoma   . Vascular insufficiency 05/2020    Family History  family history includes Cancer in his father; Diabetes in his mother.  Prior Rehab/Hospitalizations Has the patient had prior rehab or hospitalizations prior to admission? No  Has the patient had major surgery during 100 days prior to admission? Yes   Current Medications  Current Facility-Administered Medications:  .  acetaminophen (TYLENOL) tablet 650 mg, 650 mg, Oral, Q6H PRN, 650 mg at 03/09/21 2001 **OR** acetaminophen (TYLENOL) suppository 650 mg, 650 mg, Rectal, Q6H PRN, Jonelle Sidle, Mohammad L, MD .  acidophilus (RISAQUAD) capsule 2 capsule, 2 capsule, Oral, Daily, Pokhrel, Laxman, MD, 2 capsule at 03/16/21 0921 .  aspirin chewable tablet 81 mg, 81 mg, Oral, Daily, Julian Hy, DO, 81 mg at 03/16/21 9449 .  Chlorhexidine Gluconate Cloth 2 % PADS 6 each, 6  each, Topical, Q0600, Edrick Oh, MD, 6 each at 03/16/21 0542 .  clopidogrel (PLAVIX) tablet 75 mg, 75 mg, Oral, Daily, Pokhrel, Laxman, MD, 75 mg at 03/16/21 0920 .  [START ON 03/20/2021] Darbepoetin Alfa (ARANESP) injection 60 mcg, 60 mcg, Intravenous, Q Tue-HD, Pokhrel, Laxman, MD .  feeding supplement (BOOST / RESOURCE BREEZE) liquid 1 Container, 1 Container, Oral, TID BM, Julian Hy, DO, 1 Container at 03/14/21 1000 .  gabapentin (NEURONTIN) capsule 100 mg, 100 mg, Oral, Daily, Noemi Chapel P, DO, 100 mg at 03/16/21 0921 .  heparin injection 5,000 Units, 5,000 Units, Subcutaneous, Q8H, Elwyn Reach, MD, 5,000 Units at 03/16/21 0538 .  HYDROmorphone (DILAUDID) injection 0.5 mg, 0.5 mg, Intravenous, Q4H PRN, Pokhrel, Laxman, MD .  insulin aspart (novoLOG) injection 0-15 Units, 0-15 Units, Subcutaneous, TID WC, Pokhrel, Laxman, MD, 3 Units at 03/16/21 0919 .  insulin aspart (novoLOG) injection 0-5 Units, 0-5 Units, Subcutaneous, QHS, Elwyn Reach, MD, 2 Units at 03/13/21 2207 .  insulin aspart (novoLOG) injection 3 Units, 3 Units, Subcutaneous, TID WC, Julian Hy, DO, 3 Units at 03/14/21 1307 .  insulin glargine (LANTUS) injection 10 Units, 10 Units, Subcutaneous, Daily, Julian Hy, DO, 10 Units at 03/16/21 0920 .  loperamide (IMODIUM) capsule 2 mg, 2 mg, Oral, Q6H PRN, Pokhrel, Laxman, MD .  multivitamin (RENA-VIT) tablet 1 tablet, 1 tablet, Oral, QHS, Julian Hy, DO, 1 tablet at 03/15/21 2155 .  ondansetron (ZOFRAN) tablet 4 mg, 4 mg, Oral, Q6H PRN **OR** ondansetron (ZOFRAN) injection 4 mg, 4 mg, Intravenous, Q6H PRN, Elwyn Reach, MD, 4 mg at 03/10/21 2005 .  oxyCODONE (Oxy IR/ROXICODONE) immediate release tablet 10 mg, 10 mg, Oral, Q4H PRN, Pokhrel, Laxman, MD, 10 mg at 03/16/21 1005 .  pantoprazole (PROTONIX) EC tablet 40 mg, 40 mg, Oral, Daily, Verlon Au, Jai-Gurmukh, MD, 40 mg at 03/16/21 0921 .  prochlorperazine (COMPAZINE) injection 10 mg, 10 mg, Intravenous,  Q6H PRN, Stretch, Marily Lente, MD, 10 mg at 03/10/21 2328 .  rosuvastatin (CRESTOR) tablet 10 mg, 10 mg, Oral, q morning, Samtani, Jai-Gurmukh, MD, 10 mg at 03/16/21 6759  Patients Current Diet:  Diet Order            Diet Carb Modified Fluid consistency: Thin; Room service appropriate? Yes  Diet effective now                 Precautions / Restrictions Precautions Precautions: Fall Precaution Comments: R BKA Restrictions Weight Bearing Restrictions: Yes RLE Weight Bearing: Non weight bearing Other Position/Activity Restrictions: Pt does not maintain   Has the patient had 2 or more falls or a fall with injury in the past year? No  Prior Activity Level Household: Mainly stays  at home.  May go out 3 times a month.  He has his HD at home.  Prior Functional Level Self Care: Did the patient need help bathing, dressing, using the toilet or eating? Independent  Indoor Mobility: Did the patient need assistance with walking from room to room (with or without device)? Independent  Stairs: Did the patient need assistance with internal or external stairs (with or without device)? Independent  Functional Cognition: Did the patient need help planning regular tasks such as shopping or remembering to take medications? Needed some help  Home Assistive Devices / Equipment Home Equipment: Shower seat,Wheelchair - manual,Walker - 2 wheels,Walker - 4 wheels (pt states his walker "has a seat" - need clarification that he has a RW)  Prior Device Use: Indicate devices/aids used by the patient prior to current illness, exacerbation or injury? Manual wheelchair and Walker  Current Functional Level Cognition  Overall Cognitive Status: Within Functional Limits for tasks assessed Orientation Level: Oriented X4 Safety/Judgement: Decreased awareness of safety,Decreased awareness of deficits General Comments: Pt very logical with processing, likes to know exactly what the plan is for all therapy/mobility.  repeated education at times to complete task or to explain recovery process.    Extremity Assessment (includes Sensation/Coordination)  Upper Extremity Assessment: Generalized weakness  Lower Extremity Assessment: Defer to PT evaluation RLE Deficits / Details: S/p amputation. Difficult to assess due to pain, right knee extension to neutral RLE Sensation: decreased light touch LLE Deficits / Details: Grossly 3/5    ADLs  Overall ADL's : Needs assistance/impaired Eating/Feeding: Set up,Sitting Grooming: Set up,Sitting Upper Body Bathing: Set up,Minimal assistance,Sitting Lower Body Bathing: Moderate assistance,+2 for physical assistance,Sit to/from stand Upper Body Dressing : Minimal assistance,Sitting Lower Body Dressing: Moderate assistance,+2 for physical assistance Toilet Transfer: Minimal assistance,+2 for physical assistance,Stand-pivot,BSC,RW Toileting- Clothing Manipulation and Hygiene: Moderate assistance,+2 for physical assistance,Sit to/from stand Functional mobility during ADLs: Minimal assistance,Rolling walker,Cueing for safety (does not maintain WBS) General ADL Comments: Pt completed bed mobility, sat EOB a few minutes, then SPT to recliner to sit up for a bit. +2 assist needed this session during OOB transfers.    Mobility  Overal bed mobility: Needs Assistance Bed Mobility: Supine to Sit Supine to sit: Min assist Sit to supine: Supervision General bed mobility comments: minG with cues and increased time. pt using UE to raise RLE and significant UE on bed rail to pull to sitting EOB    Transfers  Overall transfer level: Needs assistance Equipment used: Rolling walker (2 wheeled) Transfers: Sit to/from [employer] Sit to Stand: Min assist,+2 physical assistance (or modA of 1) Stand pivot transfers: Min assist,+2 physical assistance General transfer comment: minA of 2 to rise from EOB or modA of 1 with increased cues for hand positioning, LLE  positioning, and posture/RLE straightening once in standing. pt using pivot on LLE initially to maneuver to recliner.    Ambulation / Gait / Stairs / Wheelchair Mobility  Ambulation/Gait Ambulation/Gait assistance: Min assist,+2 physical assistance Gait Distance (Feet): 3 Feet Assistive device: Rolling walker (2 wheeled) Gait Pattern/deviations: Trunk flexed General Gait Details: hop-to pattern with cueing for technique, use of UE to press up and complete hop-to pattern. Pt able to complete hop forwards and back on LLE. Gait velocity: decreased    Posture / Balance Dynamic Sitting Balance Sitting balance - Comments: no UE support needed, single LE support Balance Overall balance assessment: Needs assistance Sitting-balance support: Feet supported Sitting balance-Leahy Scale: Good Sitting balance - Comments: no UE support  needed, single LE support Standing balance support: Bilateral upper extremity supported Standing balance-Leahy Scale: Poor Standing balance comment: reliant on hand support    Special needs/care consideration Dialysis: Hemodialysis Monday, Wednesday, Thursday and Sunday, Skin Dressing intack to R BKA post op site. and Diabetic management Diabetic and on insulin in the hospital.   Previous Home Environment (from acute therapy documentation) Living Arrangements: Spouse/significant other Available Help at Discharge: Family Type of Home: House Home Layout: One level Home Access: Stairs to enter Entrance Stairs-Rails: Surveyor, mining of Steps: 7 Bathroom Shower/Tub: Multimedia programmer: Standard Bathroom Accessibility: Yes How Accessible: Accessible via walker,Accessible via wheelchair  Discharge [place] for Discharge Living Setting: Patient's home,House,Lives with (comment) (Lives with wife.  Wife works from home.) Type of Home at Discharge: House Discharge Home Layout: One level Discharge Home Access: Stairs to  enter Entrance Stairs-Rails: Surveyor, mining of Steps: 7 steps at the front and 8 steps at the back entry Discharge Bathroom Shower/Tub: Walk-in shower,Door Discharge [place]: Standard Discharge [place] Accessibility: Yes How Accessible: Accessible via walker  [place] Patient Roles: Spouse Contact Information: [place] - wife Anticipated Caregiver: wife Anticipated Caregiver's Contact Information: Belenda Cruise [phone] Ability/Limitations of Caregiver: Wife works from home and can provide supervision and intermittent assistance Caregiver Availability: 24/7 Discharge Plan Discussed with Primary Caregiver: Yes Is Caregiver In Agreement with Plan?: Yes Does Caregiver/Family have Issues with Lodging/Transportation while Pt is in Rehab?: No  Goals Patient/Family Goal for Rehab: PT/OT mod I and supervision goals Expected length of stay: 7-10 days Pt/Family Agrees to Admission and willing to participate: Yes Program Orientation Provided & Reviewed with Pt/Caregiver Including Roles  & Responsibilities: Yes  Decrease burden of Care through IP rehab admission: N/A  Possible need for SNF placement upon discharge: Not anticipated  Patient Condition: I have reviewed medical records from [facility], spoken with CM, and patient. I met with patient at the bedside for inpatient rehabilitation assessment.  Patient will benefit from ongoing PT and OT, can actively participate in 3 hours of therapy a day 5 days of the week, and can make measurable gains during the admission.  Patient will also benefit from the coordinated team approach during an Inpatient Acute Rehabilitation admission.  The patient will receive intensive therapy as well as Rehabilitation physician, nursing, social worker, and care management interventions.  Due to bladder management, bowel management, safety, skin/wound care, disease management, medication administration, pain  management and patient education the patient requires 24 hour a day rehabilitation nursing.  The patient is currently min assist with mobility and basic ADLs.  Discharge setting and therapy post discharge at home with home health is anticipated.  Patient has agreed to participate in the Acute Inpatient Rehabilitation Program and will admit today.  Preadmission Screen Completed By:  Retta Diones, 03/16/2021 12:17 PM ______________________________________________________________________   Discussed status with Dr. Naaman Plummer on 03/16/21 at 1422 and received approval for admission today.  Admission Coordinator:  Retta Diones, RN, time 1422/Date 03/16/21   Assessment/Plan: Diagnosis: right BKA 1. Does the need for close, 24 hr/day Medical supervision in concert with the patient's rehab needs make it unreasonable for this patient to be served in a less intensive setting? Yes 2. Co-Morbidities requiring supervision/potential complications: ESRD on HD. DM, PAD, sepsis 3. Due to bladder management, bowel management, safety, skin/wound care, disease management, medication administration, pain management and patient education, does the patient require 24 hr/day rehab nursing? Yes 4. Does the patient require coordinated  care of a physician, rehab nurse, PT, OT, and SLP to address physical and functional deficits in the context of the above medical diagnosis(es)? Yes Addressing deficits in the following areas: balance, endurance, locomotion, strength, transferring, bowel/bladder control, bathing, dressing, feeding, grooming, toileting and psychosocial support 5. Can the patient actively participate in an intensive therapy program of at least 3 hrs of therapy 5 days a week? Yes 6. The potential for patient to make measurable gains while on inpatient rehab is excellent 7. Anticipated functional outcomes upon discharge from inpatient rehab: modified independent and supervision PT, modified independent and  supervision OT, n/a SLP 8. Estimated rehab length of stay to reach the above functional goals is: 7-10 days 9. Anticipated discharge destination: Home 10. Overall Rehab/Functional Prognosis: excellent   MD Signature: Meredith Staggers, MD, [facility] 03/16/2021

## 2021-03-16 NOTE — Progress Notes (Signed)
Inpatient Rehabilitation  Patient information reviewed and entered into eRehab system by Melissa M. Bowie, M.A., CCC/SLP, PPS Coordinator.  Information including medical coding, functional ability and quality indicators will be reviewed and updated through discharge.    

## 2021-03-16 NOTE — Progress Notes (Signed)
Occupational Therapy Treatment Patient Details Name: Duane Hall MRN: [id number] DOB: [birth year] Today's Date: 03/16/2021    History of present illness Pt is a 65 y.o. M admitted with severe sepsis secondary to right foot gangrene. MRI showing subacute mildly impacted fractures of second and third metatarsal necks. 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 Significant PMH: ESRD on hemodialysis, PVD, DM2, recent right first toe amputation 1/14.   OT comments  Pt progressing towards established OT goals. Pt performing R exercises at bed level for extension of RUE; educating on importance of RUE extension for preparation of prosthetic limb. Pt performing functional transfers with Min A +2 and RW. Presenting with decreased balance and strength. Educating on progression of mobility sequence to "hoping" with RW. Pt performing one hop forward and back at recliner with Min A +2 and RW. Continue to recommend dc to CIR and will continue to follow acutely as admitted.    Follow Up Recommendations  CIR    Equipment Recommendations  3 in 1 bedside commode    Recommendations for Other Services Rehab consult    Precautions / Restrictions Precautions Precautions: Fall Precaution Comments: R BKA Restrictions Weight Bearing Restrictions: Yes RLE Weight Bearing: Non weight bearing       Mobility Bed Mobility Overal bed mobility: Needs Assistance Bed Mobility: Supine to Sit     Supine to sit: Min assist     General bed mobility comments: minG with cues and increased time. pt using UE to raise RLE and significant UE on bed rail to pull to sitting EOB    Transfers Overall transfer level: Needs assistance Equipment used: Rolling walker (2 wheeled) Transfers: Sit to/from [employer] Sit to Stand: Min assist;+2 physical assistance (or modA of 1) Stand pivot transfers: Min assist;+2 physical assistance       General transfer comment: minA of 2 to rise from EOB or modA of 1 with increased cues  for hand positioning, LLE positioning, and posture/RLE straightening once in standing. pt using pivot on LLE initially to maneuver to recliner.    Balance Overall balance assessment: Needs assistance Sitting-balance support: Feet supported Sitting balance-Leahy Scale: Good Sitting balance - Comments: no UE support needed, single LE support   Standing balance support: Bilateral upper extremity supported Standing balance-Leahy Scale: Poor Standing balance comment: reliant on hand support                           ADL either performed or assessed with clinical judgement   ADL Overall ADL's : Needs assistance/impaired                         Toilet Transfer: Minimal assistance;+2 for physical assistance;+2 for safety/equipment;RW (simulated to recliner) Toilet Transfer Details (indicate cue type and reason): Min A +2 to power up into standing. Cues for hand placement         Functional mobility during ADLs: Minimal assistance;+2 for physical assistance;+2 for safety/equipment;Rolling walker (stand pivot) General ADL Comments: Pt presenting with poor balance and strength. Performing stand pivot to reclienr with +2.     Vision       Perception     Praxis      Cognition Arousal/Alertness: Awake/alert Behavior During Therapy: WFL for tasks assessed/performed;Anxious Overall Cognitive Status: Impaired/Different from baseline Area of Impairment: Memory;Safety/judgement                     Memory: Decreased short-term  memory   Safety/Judgement: Decreased awareness of safety;Decreased awareness of deficits     General Comments: Pt very logical with processing, likes to know exactly what the plan is for all therapy/mobility. repeated education at times to complete task or to explain recovery process. Also repeating certain information and questions demonstrating poor ST memory.        Exercises Exercises: General Lower Extremity General Exercises -  Lower Extremity Quad Sets: AROM;Right;10 reps;Supine   Shoulder Instructions       General Comments pt encouraged to remain sitting, discussed prosthetic process at length, importance and role of CIR    Pertinent Vitals/ Pain       Pain Assessment: Faces Faces Pain Scale: Hurts even more Pain Location: R residual limb with all movement of RLE and to touch Pain Descriptors / Indicators: Discomfort;Grimacing;Guarding Pain Intervention(s): Monitored during session;Limited activity within patient's tolerance;Repositioned  Home Living                                          Prior Functioning/Environment              Frequency  Min 2X/week        Progress Toward Goals  OT Goals(current goals can now be found in the care plan section)  Progress towards OT goals: Progressing toward goals  Acute Rehab OT Goals Patient Stated Goal: to get rehab before going home OT Goal Formulation: With patient Time For Goal Achievement: 03/26/21 Potential to Achieve Goals: Good ADL Goals Pt Will Perform Lower Body Bathing: with modified independence;sit to/from stand Pt Will Perform Lower Body Dressing: with modified independence;sitting/lateral leans;sit to/from stand Pt Will Transfer to Toilet: with modified independence;stand pivot transfer;bedside commode Pt Will Perform Toileting - Clothing Manipulation and hygiene: with modified independence;sitting/lateral leans;sit to/from stand  Plan Discharge plan remains appropriate    Co-evaluation    PT/OT/SLP Co-Evaluation/Treatment: Yes Reason for Co-Treatment: For patient/therapist safety;To address functional/ADL transfers PT goals addressed during session: Mobility/safety with mobility;Balance;Strengthening/ROM OT goals addressed during session: ADL's and self-care      AM-PAC OT "6 Clicks" Daily Activity     Outcome Measure   Help from another person eating meals?: None Help from another person taking care  of personal grooming?: A Little Help from another person toileting, which includes using toliet, bedpan, or urinal?: A Little Help from another person bathing (including washing, rinsing, drying)?: A Lot Help from another person to put on and taking off regular upper body clothing?: A Little Help from another person to put on and taking off regular lower body clothing?: A Lot 6 Click Score: 17    End of Session Equipment Utilized During Treatment: Rolling walker;Gait belt  OT Visit Diagnosis: Unsteadiness on feet (R26.81);Other abnormalities of gait and mobility (R26.89);Muscle weakness (generalized) (M62.81)   Activity Tolerance Patient tolerated treatment well   Patient Left in chair;with call bell/phone within reach;with chair alarm set   Nurse Communication Mobility status        Time: 0383-3383 OT Time Calculation (min): 53 min  Charges: OT General Charges $OT Visit: 1 Visit OT Treatments $Neuromuscular Re-education: 23-37 mins  [place], OTR/L Acute Rehab Pager: [phone] Office: [place] 03/16/2021, 1:09 PM

## 2021-03-16 NOTE — H&P (Signed)
Physical Medicine and Rehabilitation Admission H&P    Chief Complaint  Patient presents with  . Fever  : HPI: This is a 65 year old white male with a history of end-stage renal disease on hemodialysis as well as diabetes and peripheral vascular disease.  He developed a ulcer on the right toe with gangrene that required amputation in January of this year.  This wound continued to progress.  He was admitted to the emergency room on March 07, 2021 with sepsis and infection of this ulcer.  X-rays revealed questionable pathologic fractures in the foot as well.  MRI ultimately revealed impacted fractures of the second and third metatarsal necks.  The patient failed antibiotics and more conservative measures conservative measures and ultimately he underwent a right below-knee amputation on March 13, 2021.  Course has been complicated by right stump pain as well as poor appetite and nausea related to antibiotics.  He has been maintained on dialysis per nephrology services here at the hospital.  Patient was ultimately evaluated by physical and Occupational Therapy and after review by the rehab admissions team it was felt that he would benefit from an inpatient rehab stay.  Review of Systems  Constitutional: Positive for malaise/fatigue and weight loss. Negative for chills and fever.  HENT: Negative for congestion and hearing loss.   Eyes: Negative for blurred vision and double vision.  Respiratory: Negative for cough, hemoptysis and shortness of breath.   Cardiovascular: Positive for leg swelling. Negative for chest pain and palpitations.  Gastrointestinal: Positive for diarrhea, nausea and vomiting. Negative for constipation.  Genitourinary: Negative for dysuria.  Musculoskeletal: Positive for myalgias.  Skin: Negative for rash.  Neurological: Positive for focal weakness and weakness. Negative for dizziness and headaches.  Psychiatric/Behavioral: Negative for depression. The patient is not  nervous/anxious.    Past Medical History:  Diagnosis Date  . Asthma   . Cancer ([place])   . Cataract   . CKD (chronic kidney disease), stage III ([place])   . Diabetes mellitus   . Glaucoma   . Vascular insufficiency 05/2020   Past Surgical History:  Procedure Laterality Date  . ABDOMINAL AORTOGRAM W/LOWER EXTREMITY N/A 06/19/2020   Procedure: ABDOMINAL AORTOGRAM W/LOWER EXTREMITY;  Surgeon: Waynetta Sandy, MD;  Location: [facility];  Service: Cardiovascular;  Laterality: N/A;  . ABDOMINAL AORTOGRAM W/LOWER EXTREMITY Left 10/16/2020   Procedure: ABDOMINAL AORTOGRAM W/LOWER EXTREMITY;  Surgeon: Waynetta Sandy, MD;  Location: [facility];  Service: Cardiovascular;  Laterality: Left;  . ABDOMINAL AORTOGRAM W/LOWER EXTREMITY N/A 12/25/2020   Procedure: ABDOMINAL AORTOGRAM W/LOWER EXTREMITY;  Surgeon: Waynetta Sandy, MD;  Location: [facility];  Service: Cardiovascular;  Laterality: N/A;  . AMPUTATION Right 01/12/2021   Procedure: 1ST AMPUTATION RAY;  Surgeon: [name], DPM;  Location: [facility];  Service: Podiatry;  Laterality: Right;  . AMPUTATION Right 03/13/2021   Procedure: RIGHT BELOW KNEE AMPUTATION;  Surgeon: Waynetta Sandy, MD;  Location: [facility];  Service: Vascular;  Laterality: Right;  . AV FISTULA PLACEMENT Right 09/14/2019   Procedure: Right Arm Basilic Vein transposition;  Surgeon: Angelia Mould, MD;  Location: [facility];  Service: Vascular;  Laterality: Right;  . BONE BIOPSY Left 07/29/2020   Procedure: BONE BIOPSY;  Surgeon: [name], DPM;  Location: [facility];  Service: Podiatry;  Laterality: Left;  Need bone trephines and/or large bore Giamshidi  . COLONOSCOPY    . PERIPHERAL VASCULAR ATHERECTOMY Left 06/19/2020   Procedure: PERIPHERAL VASCULAR  ATHERECTOMY;  Surgeon: Waynetta Sandy, MD;  Location: [facility];  Service: Cardiovascular;  Laterality: Left;  SFA  . PERIPHERAL VASCULAR  ATHERECTOMY  12/25/2020   Procedure: PERIPHERAL VASCULAR ATHERECTOMY;  Surgeon: Waynetta Sandy, MD;  Location: [facility];  Service: Cardiovascular;;  Lt. PT - Laser Lt. SFA - Laser  . PERIPHERAL VASCULAR BALLOON ANGIOPLASTY  12/25/2020   Procedure: PERIPHERAL VASCULAR BALLOON ANGIOPLASTY;  Surgeon: Waynetta Sandy, MD;  Location: [facility];  Service: Cardiovascular;;  Lt. SFA and PT  . UPPER GASTROINTESTINAL ENDOSCOPY  02/2019   Dr Benson Norway     Family History  Problem Relation Age of Onset  . Cancer Father   . Diabetes Mother    Social History:  reports that he has been smoking cigarettes. He has been smoking about 0.30 packs per day. He has never used smokeless tobacco. He reports that he does not drink alcohol and does not use drugs. Allergies:  Allergies  Allergen Reactions  . Amoxicillin-Pot Clavulanate Diarrhea and Other (See Comments)  . Midodrine Other (See Comments)    Other reaction(s): Urinary Sensation  . Tape Other (See Comments)    bandaids cause blistering   Facility-Administered Medications Prior to Admission  Medication Dose Route Frequency Provider Last Rate Last Admin  . 0.9 %  sodium chloride infusion  250 mL Intravenous PRN Waynetta Sandy, MD      . 0.9 %  sodium chloride infusion  250 mL Intravenous PRN Waynetta Sandy, MD      . sodium chloride flush (NS) 0.9 % injection 3 mL  3 mL Intravenous Q12H Waynetta Sandy, MD       Medications Prior to Admission  Medication Sig Dispense Refill  . aspirin EC 81 MG EC tablet Take 1 tablet (81 mg total) by mouth daily. Swallow whole. (Patient taking differently: Take 81 mg by mouth every morning. Swallow whole.) 30 tablet 11  . clopidogrel (PLAVIX) 75 MG tablet Take 1 tablet (75 mg total) by mouth daily with breakfast. (Patient taking differently: Take 75 mg by mouth every morning.) 30 tablet 3  . collagenase (SANTYL) ointment Apply 1 application topically  See admin instructions. Apply to the right foot incision 90 g 2  . Continuous Blood Gluc Sensor (FREESTYLE LIBRE 14 DAY SENSOR) MISC 1 Device by Other route every 14 (fourteen) days. 6 each 3  . docusate sodium (COLACE) 100 MG capsule Take 100 mg by mouth every morning.    . ferric citrate (AURYXIA) 1 GM 210 MG(Fe) tablet Take 210 mg by mouth 2 (two) times daily with a meal.    . Glucagon (GVOKE HYPOPEN 1-PACK) 1 MG/0.2ML SOAJ Inject 1 mg into the skin once as needed (low blood sugar).    . Insulin Aspart, w/Niacinamide, (FIASP) 100 UNIT/ML SOLN For use in pump, total of 30 units per day 90 mL 0  . Insulin Disposable Pump (OMNIPOD DASH 5 PACK PODS) MISC Use as instructed: use pod every 3 days. 30 each 3  . Insulin Pen Needle (PEN NEEDLES) 31G X 6 MM MISC 1 Device by Does not apply route in the morning, at noon, in the evening, and at bedtime. 360 each 3  . iron sucrose in sodium chloride 0.9 % 100 mL Iron Sucrose (Venofer)    . lidocaine-prilocaine (EMLA) cream Apply 1 application topically See admin instructions. Apply topically to port access one hour prior to dialysis on Sunday, Monday, Wednesday, Thursday    .  Methoxy PEG-Epoetin Beta (MIRCERA IJ) Inject into the skin every 30 (thirty) days.    . multivitamin (RENA-VIT) TABS tablet Take 1 tablet by mouth every morning.    . mupirocin ointment (BACTROBAN) 2 % Apply 1 application topically 2 (two) times daily. 15 g 2  . omeprazole (PRILOSEC) 40 MG capsule Take 40 mg by mouth every morning.    . rosuvastatin (CRESTOR) 10 MG tablet TAKE 1 TABLET BY MOUTH DAILY (Patient taking differently: Take 10 mg by mouth every morning.) 30 tablet 11  . [DISCONTINUED] gabapentin (NEURONTIN) 100 MG capsule Take 300 mg by mouth every morning.      Drug Regimen Review  Drug regimen was reviewed and remains appropriate with no significant issues identified  Home: Home Living Family/patient expects to be discharged to:: Private residence Living Arrangements:  Spouse/significant other Available Help at Discharge: Family Type of Home: House Home Access: Stairs to enter Technical brewer of Steps: 7 Entrance Stairs-Rails: Right,Left Home Layout: One level Bathroom Shower/Tub: Multimedia programmer: Standard Bathroom Accessibility: Yes Home Equipment: Shower seat,Wheelchair - manual,Walker - 2 wheels,Walker - 4 wheels (pt states his walker "has a seat" - need clarification that he has a RW)   Functional History: Prior Function Level of Independence: Needs assistance Gait / Transfers Assistance Needed: uses w/c since 1/14 amputation, modI for transfer ADL's / Homemaking Assistance Needed: independent Comments: was independent prior to toe amputation, including driving  Functional Status:  Mobility: Bed Mobility Overal bed mobility: Needs Assistance Bed Mobility: Supine to Sit Supine to sit: Min assist Sit to supine: Supervision General bed mobility comments: minG with cues and increased time. pt using UE to raise RLE and significant UE on bed rail to pull to sitting EOB Transfers Overall transfer level: Needs assistance Equipment used: Rolling walker (2 wheeled) Transfers: Sit to/from [employer] Sit to Stand: Min assist,+2 physical assistance (or modA of 1) Stand pivot transfers: Min assist,+2 physical assistance General transfer comment: minA of 2 to rise from EOB or modA of 1 with increased cues for hand positioning, LLE positioning, and posture/RLE straightening once in standing. pt using pivot on LLE initially to maneuver to recliner. Ambulation/Gait Ambulation/Gait assistance: Min assist,+2 physical assistance Gait Distance (Feet): 3 Feet Assistive device: Rolling walker (2 wheeled) Gait Pattern/deviations: Trunk flexed General Gait Details: hop-to pattern with cueing for technique, use of UE to press up and complete hop-to pattern. Pt able to complete hop forwards and back on LLE. Gait velocity:  decreased    ADL: ADL Overall ADL's : Needs assistance/impaired Eating/Feeding: Set up,Sitting Grooming: Set up,Sitting Upper Body Bathing: Set up,Minimal assistance,Sitting Lower Body Bathing: Moderate assistance,+2 for physical assistance,Sit to/from stand Upper Body Dressing : Minimal assistance,Sitting Lower Body Dressing: Moderate assistance,+2 for physical assistance Toilet Transfer: Minimal assistance,+2 for physical assistance,+2 for safety/equipment,RW (simulated to recliner) Toilet Transfer Details (indicate cue type and reason): Min A +2 to power up into standing. Cues for hand placement Toileting- Clothing Manipulation and Hygiene: Moderate assistance,+2 for physical assistance,Sit to/from stand Functional mobility during ADLs: Minimal assistance,+2 for physical assistance,+2 for safety/equipment,Rolling walker (stand pivot) General ADL Comments: Pt presenting with poor balance and strength. Performing stand pivot to reclienr with +2.  Cognition: Cognition Overall Cognitive Status: Impaired/Different from baseline Orientation Level: Oriented X4 Cognition Arousal/Alertness: Awake/alert Behavior During Therapy: WFL for tasks assessed/performed,Anxious Overall Cognitive Status: Impaired/Different from baseline Area of Impairment: Memory,Safety/judgement Memory: Decreased short-term memory Safety/Judgement: Decreased awareness of safety,Decreased awareness of deficits General Comments: Pt very logical with processing,  likes to know exactly what the plan is for all therapy/mobility. repeated education at times to complete task or to explain recovery process. Also repeating certain information and questions demonstrating poor ST memory.  Physical Exam: Blood pressure (!) 114/57, pulse 83, temperature 98.3 F (36.8 C), temperature source Oral, resp. rate 15, height 5\' 9"  (1.753 m), weight 74.4 kg, SpO2 99 %. Physical Exam Constitutional:      General: He is not in acute  distress.    Appearance: He is not toxic-appearing.     Comments: Frail-appearing  HENT:     Head: Normocephalic and atraumatic.     Right Ear: External ear normal.     Left Ear: External ear normal.     Nose: Nose normal.     Mouth/Throat:     Mouth: Mucous membranes are moist.     Pharynx: Oropharynx is clear.  Eyes:     General: No scleral icterus.    Extraocular Movements: Extraocular movements intact.     Pupils: Pupils are equal, round, and reactive to light.  Cardiovascular:     Rate and Rhythm: Normal rate and regular rhythm.     Heart sounds: No murmur heard. No gallop.   Pulmonary:     Effort: Pulmonary effort is normal. No respiratory distress.     Breath sounds: No wheezing.  Abdominal:     General: Bowel sounds are normal. There is no distension.     Palpations: Abdomen is soft.  Musculoskeletal:     Cervical back: Normal range of motion and neck supple. No rigidity.     Comments: Right lower extremity with distal edema.  Leg fairly well shaped.  Skin:    Comments: Right BKA incision clean dry and intact without staples.  No dressing is in place.  Left heel notable for dry skin and signs of old, healed wound.  Heel was slightly reddened but no signs of breakdown.  Neurological:     General: No focal deficit present.     Mental Status: He is oriented to person, place, and time.     Cranial Nerves: No cranial nerve deficit.     Deep Tendon Reflexes: Reflexes normal.     Comments: Upper extremity strength grossly 5 out of 5.  Right lower extremity is 3 out of 5 at the hip and knee grossly.  Left lower extremity is 3 out of 5 proximal to 4+ to 5 out of 5 distally.  Is decreased sensation to light touch in the distal left lower extremity.  Psychiatric:     Comments: Patient is cooperative but very flat and makes infrequent eye contact     Results for orders placed or performed during the hospital encounter of 03/07/21 (from the past 48 hour(s))  Glucose, capillary      Status: Abnormal   Collection Time: 03/14/21  9:43 PM  Result Value Ref Range   Glucose-Capillary 174 (H) 70 - 99 mg/dL    Comment: Glucose reference range applies only to samples taken after fasting for at least 8 hours.  CBC     Status: Abnormal   Collection Time: 03/15/21  2:49 AM  Result Value Ref Range   WBC 6.0 4.0 - 10.5 K/uL   RBC 2.97 (L) 4.22 - 5.81 MIL/uL   Hemoglobin 9.2 (L) 13.0 - 17.0 g/dL   HCT 28.7 (L) 39.0 - 52.0 %   MCV 96.6 80.0 - 100.0 fL   MCH 31.0 26.0 - 34.0 pg   MCHC 32.1 30.0 -  36.0 g/dL   RDW 13.3 11.5 - 15.5 %   Platelets 208 150 - 400 K/uL   nRBC 0.0 0.0 - 0.2 %    Comment: Performed at [facility], [place] [address], [place], [place] [address]  Magnesium     Status: None   Collection Time: 03/15/21  2:49 AM  Result Value Ref Range   Magnesium 1.7 1.7 - 2.4 mg/dL    Comment: Performed at [facility] [address] [place], [place] [address]  Phosphorus     Status: None   Collection Time: 03/15/21  2:49 AM  Result Value Ref Range   Phosphorus 2.6 2.5 - 4.6 mg/dL    Comment: Performed at [facility] [address], [place], [place] [address]  Basic metabolic panel     Status: Abnormal   Collection Time: 03/15/21  2:49 AM  Result Value Ref Range   Sodium 137 135 - 145 mmol/L   Potassium 3.9 3.5 - 5.1 mmol/L   Chloride 102 98 - 111 mmol/L   CO2 27 22 - 32 mmol/L   Glucose, Bld 241 (H) 70 - 99 mg/dL    Comment: Glucose reference range applies only to samples taken after fasting for at least 8 hours.   BUN 18 8 - 23 mg/dL   Creatinine, Ser 3.65 (H) 0.61 - 1.24 mg/dL    Comment: DELTA CHECK NOTED   Calcium 7.7 (L) 8.9 - 10.3 mg/dL   GFR, Estimated 18 (L) >60 mL/min    Comment: (NOTE) Calculated using the CKD-EPI Creatinine Equation (2021)    Anion gap 8 5 - 15    Comment: Performed at [facility] [address], [place], [place] [address]  Glucose, capillary     Status: Abnormal   Collection Time: 03/15/21  7:56 AM   Result Value Ref Range   Glucose-Capillary 209 (H) 70 - 99 mg/dL    Comment: Glucose reference range applies only to samples taken after fasting for at least 8 hours.  Iron and TIBC     Status: Abnormal   Collection Time: 03/15/21  7:58 AM  Result Value Ref Range   Iron 36 (L) 45 - 182 ug/dL   TIBC 109 (L) 250 - 450 ug/dL   Saturation Ratios 33 17.9 - 39.5 %   UIBC 73 ug/dL    Comment: Performed at [facility] [address], [place], [place] [address]  Glucose, capillary     Status: None   Collection Time: 03/15/21 11:53 AM  Result Value Ref Range   Glucose-Capillary 92 70 - 99 mg/dL    Comment: Glucose reference range applies only to samples taken after fasting for at least 8 hours.  Glucose, capillary     Status: Abnormal   Collection Time: 03/15/21  4:48 PM  Result Value Ref Range   Glucose-Capillary 220 (H) 70 - 99 mg/dL    Comment: Glucose reference range applies only to samples taken after fasting for at least 8 hours.  Glucose, capillary     Status: Abnormal   Collection Time: 03/15/21  9:11 PM  Result Value Ref Range   Glucose-Capillary 142 (H) 70 - 99 mg/dL    Comment: Glucose reference range applies only to samples taken after fasting for at least 8 hours.  Basic metabolic panel     Status: Abnormal   Collection Time: 03/16/21  2:39 AM  Result Value Ref Range   Sodium 138 135 - 145  mmol/L   Potassium 4.0 3.5 - 5.1 mmol/L   Chloride 102 98 - 111 mmol/L   CO2 28 22 - 32 mmol/L   Glucose, Bld 141 (H) 70 - 99 mg/dL    Comment: Glucose reference range applies only to samples taken after fasting for at least 8 hours.   BUN 27 (H) 8 - 23 mg/dL   Creatinine, Ser 5.64 (H) 0.61 - 1.24 mg/dL    Comment: DELTA CHECK NOTED   Calcium 7.6 (L) 8.9 - 10.3 mg/dL   GFR, Estimated 11 (L) >60 mL/min    Comment: (NOTE) Calculated using the CKD-EPI Creatinine Equation (2021)    Anion gap 8 5 - 15    Comment: Performed at [facility] [address] [place],  [place] [address]  CBC     Status: Abnormal   Collection Time: 03/16/21  2:39 AM  Result Value Ref Range   WBC 5.6 4.0 - 10.5 K/uL   RBC 2.75 (L) 4.22 - 5.81 MIL/uL   Hemoglobin 8.5 (L) 13.0 - 17.0 g/dL   HCT 26.8 (L) 39.0 - 52.0 %   MCV 97.5 80.0 - 100.0 fL   MCH 30.9 26.0 - 34.0 pg   MCHC 31.7 30.0 - 36.0 g/dL   RDW 13.7 11.5 - 15.5 %   Platelets 223 150 - 400 K/uL   nRBC 0.0 0.0 - 0.2 %    Comment: Performed at [facility], [place] [address], [place], [place] [address]  Magnesium     Status: None   Collection Time: 03/16/21  2:39 AM  Result Value Ref Range   Magnesium 1.8 1.7 - 2.4 mg/dL    Comment: Performed at [facility] [address] [place], [place] [address]  Glucose, capillary     Status: Abnormal   Collection Time: 03/16/21  7:38 AM  Result Value Ref Range   Glucose-Capillary 198 (H) 70 - 99 mg/dL    Comment: Glucose reference range applies only to samples taken after fasting for at least 8 hours.  Glucose, capillary     Status: Abnormal   Collection Time: 03/16/21 11:32 AM  Result Value Ref Range   Glucose-Capillary 173 (H) 70 - 99 mg/dL    Comment: Glucose reference range applies only to samples taken after fasting for at least 8 hours.   No results found.     Medical Problem List and Plan: 1.  Functional and mobility deficits secondary to gangrene of the right foot leading to a left below-knee amputation  -patient may shower if the right below-knee amputation site is were approved  -ELOS/Goals: Supervision to modified independent goals for PT and OT with length of stay 7 to 10 days 2.  Antithrombotics: -DVT/anticoagulation:  Pharmaceutical: Heparin 5000 units every 8 hours  -antiplatelet therapy: 81 mg of aspirin daily, Plavix 75 mg daily 3. Pain Management: Tylenol for mild pain  -Oxycodone for moderate to severe pain  -Robaxin as needed for spasms  -Gabapentin 100 mg daily for phantom limb pain  -Discussed limb massage and desensitization activities  as well as visual therapy with patient 4. Mood: Team to provide ego support as needed  -antipsychotic agents: N/AA  -He does appear at risk for depression 5. Neuropsych: This patient is capable of making decisions on his own behalf. 6. Skin/Wound Care: Apply dry dressing with Ace wrap to right below knee amputation stump.  Float left heel to prevent breakdown 7. Fluids/Electrolytes/Nutrition: Encourage adequate p.o.  We discussed today  the importance of his nutrition as a reflects upon his strength and wound healing.  -Patient with low albumin: Continue daily supplements 8.  End-stage renal disease on hemodialysis.  -Patient on a Monday Wednesday Friday schedule at this point  -Dialysis to be scheduled after therapy to maximize therapy participation and tolerance 9.  Hypotension: Observe blood pressures while on the unit and especially after hemodialysis days 10.  Acute on chronic anemia: Patient on Aranesp weekly.  Monitor serial hemoglobins with dialysis 11.  Type 2 diabetes.  Borderline control at present  -Continue NovoLog 3 units with meals as well as Lantus 10 units daily  -CBGs before meals and at bedtime with sliding scale insulin  -Adjust regimen as needed        Meredith Staggers, MD 03/16/2021

## 2021-03-16 NOTE — Progress Notes (Signed)
IP rehab admissions - I have approval from [facility] for acute inpatient rehab admission for today.  If MD is in agreement, we can admit to CIR today.  We do have beds available today.  Call me for questions.  [phone]

## 2021-03-16 NOTE — Progress Notes (Signed)
[facility] Progress Note   Subjective:   Seen in room, resting with eyes closed. Denies SOB, CP, palpitations, dizziness, abdominal pain and nausea. Pending CIR decision.   Objective Vitals:   03/16/21 0404 03/16/21 0741 03/16/21 0918 03/16/21 1133  BP:  (!) 113/59  (!) 114/57  Pulse:  88  83  Resp: 15 16 16 15   Temp:  98.4 F (36.9 C)  98.3 F (36.8 C)  TempSrc:  Oral  Oral  SpO2: 99% 100% 99% 99%  Weight:      Height:       Physical Exam General: Well developed male, alert and in NAD Heart: RRR, no mumurs, rubs or gallops Lungs: CTA anteriorly without wheezing, rhonchi or rales Abdomen: Soft, non-tender, non-distended, +BS Extremities: No edema b/l lower extremities Dialysis Access: RUE AVF + bruit  Additional Objective Labs: Basic Metabolic Panel: Recent Labs  Lab 03/13/21 0536 03/15/21 0249 03/16/21 0239  NA 135 137 138  K 4.0 3.9 4.0  CL 98 102 102  CO2 24 27 28   GLUCOSE 136* 241* 141*  BUN 66* 18 27*  CREATININE 9.23* 3.65* 5.64*  CALCIUM 8.3* 7.7* 7.6*  PHOS 2.8 2.6  --     CBC: Recent Labs  Lab 03/12/21 0151 03/13/21 0536 03/15/21 0249 03/16/21 0239  WBC 7.3 7.9 6.0 5.6  HGB 8.8* 8.7* 9.2* 8.5*  HCT 26.7* 27.8* 28.7* 26.8*  MCV 94.3 97.2 96.6 97.5  PLT 167 209 208 223   Blood Culture    Component Value Date/Time   SDES BLOOD LEFT HAND 03/07/2021 2046   SPECREQUEST  03/07/2021 2046    BOTTLES DRAWN AEROBIC AND ANAEROBIC Blood Culture adequate volume   CULT  03/07/2021 2046    NO GROWTH 5 DAYS Performed at [facility], [address], [place], [place] [address]    REPTSTATUS 03/13/2021 FINAL 03/07/2021 2046    Cardiac Enzymes: No results for input(s): CKTOTAL, CKMB, CKMBINDEX, TROPONINI in the last 168 hours. CBG: Recent Labs  Lab 03/15/21 1153 03/15/21 1648 03/15/21 2111 03/16/21 0738 03/16/21 1132  GLUCAP 92 220* 142* 198* 173*   Iron Studies:  Recent Labs    03/15/21 0758  IRON 36*  TIBC 109*    @lablastinr3 @ Studies/Results: No results found. Medications:  . acidophilus  2 capsule Oral Daily  . aspirin  81 mg Oral Daily  . Chlorhexidine Gluconate Cloth  6 each Topical Q0600  . clopidogrel  75 mg Oral Daily  . [START ON 03/20/2021] darbepoetin (ARANESP) injection - DIALYSIS  60 mcg Intravenous Q Tue-HD  . feeding supplement  1 Container Oral TID BM  . gabapentin  100 mg Oral Daily  . heparin  5,000 Units Subcutaneous Q8H  . insulin aspart  0-15 Units Subcutaneous TID WC  . insulin aspart  0-5 Units Subcutaneous QHS  . insulin aspart  3 Units Subcutaneous TID WC  . insulin glargine  10 Units Subcutaneous Daily  . multivitamin  1 tablet Oral QHS  . pantoprazole  40 mg Oral Daily  . rosuvastatin  10 mg Oral q morning    Dialysis Orders: OP HD:[facility] home therapies. 4 HD per week, [place] Fri. 76kg edw Hep 3000  Assessment/Plan: 1. Sepsis D/T Diabetic foot wound- MRI +for osteo R foot. IV abx w/ improving BP's and MS. On Vanc/ meropenem.  Status post BKA 03/14/2021. Possibly going to CIR, insurance decision pending.  2. Hypokalemia-resolved, K+ 4.0 this AM 3. ESRD - Home hemo patient.    Next  HD 03/16/2021.Plan 3 HD per week while here on MWF , 3.5 - 4h as needed. 4. Hypotension/ volume: BP improved. no vol excess, under dry by 1kg.Keep even next HD 5. Anemia -   Hgb 8.5, started on Darbepoetin 60 mcg weekly. Tsat 33% on 03/15/21.  6. Metabolic bone disease - Corrected calcium at goal. On Auryxia binders. No VDRA on medication list from [place]. 7. Nutrition - Albumin low. Changed to renal/carb mod diet with fluid restrictions. Added prosource and renal vits. 8. DMT2-per primary 9. H/O Hep C 10. H/O COPD, tobacco use.  11. H/O PAD onclopidogrel.   Anice Paganini, PA-C 03/16/2021, 12:01 PM  [facility] Pager: [phone]

## 2021-03-16 NOTE — Progress Notes (Addendum)
PROGRESS NOTE   Subjective/Complaints: Irritable. Angry that he can't get up and that he's not in control. Refused to wear dressing on stump per RN. Pt denies although he's not wearing anything this morning. Wants to be able to get up to bathroom. Pain control fair  ROS: Limited due to cognitive/behavioral    Objective:   No results found. Recent Labs    03/15/21 0249 03/16/21 0239  WBC 6.0 5.6  HGB 9.2* 8.5*  HCT 28.7* 26.8*  PLT 208 223   Recent Labs    03/15/21 0249 03/16/21 0239  NA 137 138  K 3.9 4.0  CL 102 102  CO2 27 28  GLUCOSE 241* 141*  BUN 18 27*  CREATININE 3.65* 5.64*  CALCIUM 7.7* 7.6*   No intake or output data in the 24 hours ending 03/16/21 2016   Pressure Injury 06/19/20 Heel Left Unstageable - Full thickness tissue loss in which the base of the injury is covered by slough (yellow, tan, gray, green or brown) and/or eschar (tan, brown or black) in the wound bed. eschar  (Active)  06/19/20 2010  Location: Heel  Location Orientation: Left  Staging: Unstageable - Full thickness tissue loss in which the base of the injury is covered by slough (yellow, tan, gray, green or brown) and/or eschar (tan, brown or black) in the wound bed.  Wound Description (Comments): eschar   Present on Admission: Yes     Pressure Injury 06/19/20 Toe (Comment  which one) Bilateral;Right;Left Stage 2 -  Partial thickness loss of dermis presenting as a shallow open injury with a red, pink wound bed without slough. scab formed on both toes (Active)  06/19/20 2010  Location: Toe (Comment  which one) (great/big toe)  Location Orientation: Bilateral;Right;Left  Staging: Stage 2 -  Partial thickness loss of dermis presenting as a shallow open injury with a red, pink wound bed without slough.  Wound Description (Comments): scab formed on both toes  Present on Admission: Yes     Pressure Injury 06/19/20 Heel Right Stage 2 -   Partial thickness loss of dermis presenting as a shallow open injury with a red, pink wound bed without slough. skin tear (Active)  06/19/20 2010  Location: Heel  Location Orientation: Right  Staging: Stage 2 -  Partial thickness loss of dermis presenting as a shallow open injury with a red, pink wound bed without slough.  Wound Description (Comments): skin tear  Present on Admission: Yes    Physical Exam: Vital Signs Blood pressure 130/64, pulse 84, temperature 98.8 F (37.1 C), temperature source Oral, resp. rate 17, height 5\' 9"  (1.753 m), weight 74.4 kg.  General: Alert and oriented x 3, No apparent distress HEENT: Head is normocephalic, atraumatic, PERRLA, EOMI, sclera anicteric, oral mucosa pink and moist, dentition intact, ext ear canals clear,  Neck: Supple without JVD or lymphadenopathy Heart: Reg rate and rhythm. No murmurs rubs or gallops Chest: CTA bilaterally without wheezes, rales, or rhonchi; no distress Abdomen: Soft, non-tender, non-distended, bowel sounds positive. Extremities: No clubbing, cyanosis, or edema. Pulses are 2+ Psych: irritable and distracted Skin: right BK with staples, cdi, edematous. Left heel with old healed/ing wound Neuro: Pt  is cognitively appropriate with normal insight, memory, and awareness. Cranial nerves 2-12 are intact. Sensory exam is normal. Reflexes are 2+ in all 4's. Fine motor coordination is intact. No tremors. Motor function is grossly 5/5.  Musculoskeletal: Full ROM, No pain with AROM or PROM in the neck, trunk, or extremities. Posture appropriate     Assessment/Plan: 1. Functional deficits which require 3+ hours per day of interdisciplinary therapy in a comprehensive inpatient rehab setting.  Physiatrist is providing close team supervision and 24 hour management of active medical problems listed below.  Physiatrist and rehab team continue to assess barriers to discharge/monitor patient progress toward functional and medical  goals  Care Tool:  Bathing              Bathing assist       Upper Body Dressing/Undressing Upper body dressing        Upper body assist      Lower Body Dressing/Undressing Lower body dressing            Lower body assist       Toileting Toileting    Toileting assist       Transfers Chair/bed transfer  Transfers assist           Locomotion Ambulation   Ambulation assist              Walk 10 feet activity   Assist           Walk 50 feet activity   Assist           Walk 150 feet activity   Assist           Walk 10 feet on uneven surface  activity   Assist           Wheelchair     Assist               Wheelchair 50 feet with 2 turns activity    Assist            Wheelchair 150 feet activity     Assist          Blood pressure 130/64, pulse 84, temperature 98.8 F (37.1 C), temperature source Oral, resp. rate 17, height 5\' 9"  (1.753 m), weight 74.4 kg.  Medical Problem List and Plan: 1.Functional and mobility deficitssecondary to gangrene of the right foot leading to a right below-knee amputation -patient may showerif the right below-knee amputation site is were approved -ELOS/Goals: Supervision to modified independent goals for PT and OT with length of stay 7 to 10 days  -begin therapies today 2. Antithrombotics: -DVT/anticoagulation:Pharmaceutical:Heparin5000 units every 8 hours -antiplatelet therapy: 81 mg of aspirin daily,Plavix 75 mg daily 3. Pain Management:Tylenol for mild pain -Oxycodone for moderate to severe pain -Robaxin as needed for spasms -Gabapentin 100 mg daily for phantom limb pain -have reviewed limb massage and desensitization activities as well as visual therapy with patient 4. Mood:Team to provide ego support as needed -antipsychotic agents:  N/AA -He does appear at risk for depression. 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 Struggling with his role currently 5. Neuropsych: This patientiscapable of making decisions on hisown behalf. 6. Skin/Wound Care:Asked pt again that we apply dry dressing with Ace wrap to right below knee amputation stump daily  -mayFloat left heel to prevent breakdown 7. Fluids/Electrolytes/Nutrition:Encourage adequate p.o. We discussed today the importance of his nutrition as a reflects upon his strength and wound healing. -Patient with low albumin: Continue daily supplements  8.End-stage renal disease on hemodialysis. -Patient on a Monday Wednesday Friday schedule at this point -Dialysis to be scheduled after therapy to maximize therapy participation and tolerance 9.Hypotension: Observe blood pressures while on the unit and especially after hemodialysis days  -bp holding today 3/19 10.Acute on chronic anemia: Patient on Aranesp weekly. Monitor serial hemoglobins with dialysis 11.Type 2 diabetes.Borderline control at present -Continue NovoLog 3 units with meals as well as Lantus 10 units daily -CBGs before meals and at bedtime with sliding scale insulin -fair control at present. No changes today    LOS: 0 days A FACE TO FACE EVALUATION WAS PERFORMED  Meredith Staggers 03/16/2021, 8:16 PM

## 2021-03-16 NOTE — Progress Notes (Signed)
Physical Therapy Treatment Patient Details Name: Duane Hall MRN: [id number] DOB: 04-Apr-1956 Today's Date: 03/16/2021    History of Present Illness Pt is a 64 y.o. M admitted with severe sepsis secondary to right foot gangrene. MRI showing subacute mildly impacted fractures of second and third metatarsal necks. 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 Significant PMH: ESRD on hemodialysis, PVD, DM2, recent right first toe amputation 1/14.    PT Comments    The pt was seen by PT/OT in conjunction this morning to safely allow for progression of OOB mobility and gait. The pt was able to demo progress with independence in bed mobility, but continues to require max cueing for technique, positioning, and sequencing for sit-stand and stand pivot transfers. The pt also continues to present with strength and power deficits in LLE that limit his ability to power up to standing without assist of 1-2. The pt was able to progress activity this session to include a few hops with use of RW with improved technique, but will still benefit from CIR level therapies. The pt was able to tolerate entire session without use of PCA pump, RN states he is tolerating PO medications well at this time.     Follow Up Recommendations  CIR     Equipment Recommendations  3in1 (PT)    Recommendations for Other Services       Precautions / Restrictions Precautions Precautions: Fall Precaution Comments: R BKA Restrictions Weight Bearing Restrictions: Yes RLE Weight Bearing: Non weight bearing    Mobility  Bed Mobility Overal bed mobility: Needs Assistance Bed Mobility: Supine to Sit     Supine to sit: Min assist     General bed mobility comments: minG with cues and increased time. pt using UE to raise RLE and significant UE on bed rail to pull to sitting EOB    Transfers Overall transfer level: Needs assistance Equipment used: Rolling walker (2 wheeled) Transfers: Sit to/from [facility] Sit to Stand: Min  assist;+2 physical assistance (or modA of 1) Stand pivot transfers: Min assist;+2 physical assistance       General transfer comment: minA of 2 to rise from EOB or modA of 1 with increased cues for hand positioning, LLE positioning, and posture/RLE straightening once in standing. pt using pivot on LLE initially to maneuver to recliner.  Ambulation/Gait Ambulation/Gait assistance: Min assist;+2 physical assistance Gait Distance (Feet): 3 Feet Assistive device: Rolling walker (2 wheeled) Gait Pattern/deviations: Trunk flexed Gait velocity: decreased   General Gait Details: hop-to pattern with cueing for technique, use of UE to press up and complete hop-to pattern. Pt able to complete hop forwards and back on LLE.      Balance Overall balance assessment: Needs assistance Sitting-balance support: Feet supported Sitting balance-Leahy Scale: Good Sitting balance - Comments: no UE support needed, single LE support   Standing balance support: Bilateral upper extremity supported Standing balance-Leahy Scale: Poor Standing balance comment: reliant on hand support                            Cognition Arousal/Alertness: Awake/alert Behavior During Therapy: WFL for tasks assessed/performed;Anxious Overall Cognitive Status: Within Functional Limits for tasks assessed                                 General Comments: Pt very logical with processing, likes to know exactly what the plan is for all therapy/mobility. repeated  education at times to complete task or to explain recovery process.      Exercises General Exercises - Lower Extremity Quad Sets: AROM;Right;10 reps;Supine    General Comments General comments (skin integrity, edema, etc.): pt encouraged to remain sitting, discussed prosthetic process at length, importance and role of CIR      Pertinent Vitals/Pain Pain Assessment: Faces Faces Pain Scale: Hurts even more Pain Location: R residual limb with  all movement of RLE and to touch Pain Descriptors / Indicators: Discomfort;Grimacing;Guarding Pain Intervention(s): Limited activity within patient's tolerance;Monitored during session;Repositioned           PT Goals (current goals can now be found in the care plan section) Acute Rehab PT Goals Patient Stated Goal: to get rehab before going home PT Goal Formulation: With patient Time For Goal Achievement: 03/28/21 Potential to Achieve Goals: Good Progress towards PT goals: Progressing toward goals    Frequency    Min 3X/week      PT Plan Current plan remains appropriate    Co-evaluation PT/OT/SLP Co-Evaluation/Treatment: Yes Reason for Co-Treatment: For patient/therapist safety;To address functional/ADL transfers PT goals addressed during session: Mobility/safety with mobility;Balance;Strengthening/ROM        AM-PAC PT "6 Clicks" Mobility   Outcome Measure  Help needed turning from your back to your side while in a flat bed without using bedrails?: None Help needed moving from lying on your back to sitting on the side of a flat bed without using bedrails?: A Little Help needed moving to and from a bed to a chair (including a wheelchair)?: A Little Help needed standing up from a chair using your arms (e.g., wheelchair or bedside chair)?: A Little Help needed to walk in hospital room?: A Lot Help needed climbing 3-5 steps with a railing? : Total 6 Click Score: 16    End of Session Equipment Utilized During Treatment: Gait belt Activity Tolerance: Patient tolerated treatment well Patient left: in chair;with call bell/phone within reach;with chair alarm set Nurse Communication: Mobility status PT Visit Diagnosis: Unsteadiness on feet (R26.81);Muscle weakness (generalized) (M62.81);Difficulty in walking, not elsewhere classified (R26.2)     Time: 7341-9379 PT Time Calculation (min) (ACUTE ONLY): 54 min  Charges:  $Therapeutic Exercise: 8-22 mins $Therapeutic  Activity: 8-22 mins                     Karma Ganja, PT, DPT   Acute Rehabilitation Department Pager #: [phone]   Otho Bellows 03/16/2021, 11:04 AM

## 2021-03-17 ENCOUNTER — Inpatient Hospital Stay (HOSPITAL_COMMUNITY): Payer: BC Managed Care – PPO

## 2021-03-17 DIAGNOSIS — T8743 Infection of amputation stump, right lower extremity: Secondary | ICD-10-CM | POA: Insufficient documentation

## 2021-03-17 DIAGNOSIS — M7989 Other specified soft tissue disorders: Secondary | ICD-10-CM

## 2021-03-17 LAB — GLUCOSE, CAPILLARY
Glucose-Capillary: 129 mg/dL — ABNORMAL HIGH (ref 70–99)
Glucose-Capillary: 130 mg/dL — ABNORMAL HIGH (ref 70–99)
Glucose-Capillary: 131 mg/dL — ABNORMAL HIGH (ref 70–99)
Glucose-Capillary: 132 mg/dL — ABNORMAL HIGH (ref 70–99)
Glucose-Capillary: 176 mg/dL — ABNORMAL HIGH (ref 70–99)

## 2021-03-17 NOTE — Progress Notes (Signed)
[facility] Progress Note   Subjective:   Pt seen in room. Now in CIR. Had HD late last night, tolerated well. Denies SOB, CP, dizziness.   Objective Vitals:   03/16/21 2347 03/17/21 0000 03/17/21 0015 03/17/21 0322  BP: (!) 89/55 (!) 109/56 (!) 99/53   Pulse: 83 84 83   Resp: 10 12 18    Temp:  98.8 F (37.1 C)    TempSrc:  Oral    SpO2: 100% 99% 99%   Weight:    75.5 kg  Height:       Physical Exam  General: Well developed male, alert and in NAD Heart: RRR, no mumurs, rubs or gallops Lungs: CTA bilaterally without wheezing, rhonchi or rales Abdomen: Soft, non-tender, non-distended, +BS Extremities: No edema b/l lower extremities, R stump wrapped Dialysis Access: RUE AVF + bruit   Additional Objective Labs: Basic Metabolic Panel: Recent Labs  Lab 03/13/21 0536 03/15/21 0249 03/16/21 0239  NA 135 137 138  K 4.0 3.9 4.0  CL 98 102 102  CO2 24 27 28   GLUCOSE 136* 241* 141*  BUN 66* 18 27*  CREATININE 9.23* 3.65* 5.64*  CALCIUM 8.3* 7.7* 7.6*  PHOS 2.8 2.6  --    Liver Function Tests: No results for input(s): AST, ALT, ALKPHOS, BILITOT, PROT, ALBUMIN in the last 168 hours. No results for input(s): LIPASE, AMYLASE in the last 168 hours. CBC: Recent Labs  Lab 03/12/21 0151 03/13/21 0536 03/15/21 0249 03/16/21 0239  WBC 7.3 7.9 6.0 5.6  HGB 8.8* 8.7* 9.2* 8.5*  HCT 26.7* 27.8* 28.7* 26.8*  MCV 94.3 97.2 96.6 97.5  PLT 167 209 208 223   Blood Culture    Component Value Date/Time   SDES BLOOD LEFT HAND 03/07/2021 2046   SPECREQUEST  03/07/2021 2046    BOTTLES DRAWN AEROBIC AND ANAEROBIC Blood Culture adequate volume   CULT  03/07/2021 2046    NO GROWTH 5 DAYS Performed at [facility] [address] [place], [place] [address]    REPTSTATUS 03/13/2021 FINAL 03/07/2021 2046   CBG: Recent Labs  Lab 03/16/21 0738 03/16/21 1132 03/16/21 1819 03/17/21 0050 03/17/21 0610  GLUCAP 198* 173* 114* 130* 176*   Iron Studies:   Recent Labs    03/15/21 0758  IRON 36*  TIBC 109*   @lablastinr3 @ Studies/Results: No results found. Medications:  . acidophilus  2 capsule Oral Daily  . aspirin  81 mg Oral Daily  . clopidogrel  75 mg Oral Daily  . [START ON 03/20/2021] darbepoetin (ARANESP) injection - DIALYSIS  60 mcg Intravenous Q Tue-HD  . feeding supplement  1 Container Oral TID BM  . gabapentin  100 mg Oral Daily  . heparin  5,000 Units Subcutaneous Q8H  . insulin aspart  0-15 Units Subcutaneous TID WC  . insulin aspart  0-5 Units Subcutaneous QHS  . insulin aspart  3 Units Subcutaneous TID WC  . insulin glargine  10 Units Subcutaneous Daily  . multivitamin  1 tablet Oral QHS  . pantoprazole  40 mg Oral Daily  . rosuvastatin  10 mg Oral q morning    Dialysis Orders:  OP HD:[facility] home therapies. 4 HD per week, [place]u[facility] Fri. 76kg edw Hep 3000  Assessment/Plan:  1. Sepsis D/TDiabetic foot wound- MRI +for osteo R foot. IV abx w/ improving BP's and MS. On Vanc/ meropenem.Status post BKA 03/14/2021. Now in CIR 2. Hypokalemia-resolved, K+ 4.0 3. ESRD - Home hemo patient. Plan 3 HD per week while here  on MWF , 3.5 - 4h as needed. 4. Hypotension/ volume: BP improved. no vol excess,minimal UF goals with HD.  5. Anemia - Hgb 8.5, started onDarbepoetin 60 mcg weekly. Tsat 33% on 03/15/21.  6. Metabolic bone disease - Corrected calcium at goal. On Auryxia binders. No VDRA on medication list from [place]. 7. Nutrition - Albumin low. Changed to renal/carb mod diet with fluid restrictions. Added prosource and renal vits. 8. DMT2-per primary 9. H/O Hep C 10. H/O COPD, tobacco use.  11. H/O PAD onclopidogrel.   Anice Paganini, PA-C 03/17/2021, 9:44 AM  [facility] Pager: [phone]

## 2021-03-17 NOTE — Progress Notes (Signed)
Dressing to stump isn't done, patient refused at this time and requested to do later.

## 2021-03-17 NOTE — Plan of Care (Signed)
  Problem: RH Balance Goal: LTG: Patient will maintain dynamic sitting balance (OT) Description: LTG:  Patient will maintain dynamic sitting balance with assistance during activities of daily living (OT) Flowsheets (Taken 03/17/2021 1252) LTG: Pt will maintain dynamic sitting balance during ADLs with: Independent with assistive device Goal: LTG Patient will maintain dynamic standing with ADLs (OT) Description: LTG:  Patient will maintain dynamic standing balance with assist during activities of daily living (OT)  Flowsheets (Taken 03/17/2021 1252) LTG: Pt will maintain dynamic standing balance during ADLs with: Independent with assistive device   Problem: Sit to Stand Goal: LTG:  Patient will perform sit to stand in prep for activites of daily living with assistance level (OT) Description: LTG:  Patient will perform sit to stand in prep for activites of daily living with assistance level (OT) Flowsheets (Taken 03/17/2021 1252) LTG: PT will perform sit to stand in prep for activites of daily living with assistance level: Independent with assistive device   Problem: RH Grooming Goal: LTG Patient will perform grooming w/assist,cues/equip (OT) Description: LTG: Patient will perform grooming with assist, with/without cues using equipment (OT) Flowsheets (Taken 03/17/2021 1252) LTG: Pt will perform grooming with assistance level of: Independent with assistive device    Problem: RH Bathing Goal: LTG Patient will bathe all body parts with assist levels (OT) Description: LTG: Patient will bathe all body parts with assist levels (OT) Flowsheets (Taken 03/17/2021 1252) LTG: Pt will perform bathing with assistance level/cueing: Independent with assistive device    Problem: RH Dressing Goal: LTG Patient will perform upper body dressing (OT) Description: LTG Patient will perform upper body dressing with assist, with/without cues (OT). Flowsheets (Taken 03/17/2021 1252) LTG: Pt will perform upper body  dressing with assistance level of: Independent with assistive device Goal: LTG Patient will perform lower body dressing w/assist (OT) Description: LTG: Patient will perform lower body dressing with assist, with/without cues in positioning using equipment (OT) Flowsheets (Taken 03/17/2021 1252) LTG: Pt will perform lower body dressing with assistance level of: Independent with assistive device   Problem: RH Toileting Goal: LTG Patient will perform toileting task (3/3 steps) with assistance level (OT) Description: LTG: Patient will perform toileting task (3/3 steps) with assistance level (OT)  Flowsheets (Taken 03/17/2021 1252) LTG: Pt will perform toileting task (3/3 steps) with assistance level: Independent with assistive device   Problem: RH Simple Meal Prep Goal: LTG Patient will perform simple meal prep w/assist (OT) Description: LTG: Patient will perform simple meal prep with assistance, with/without cues (OT). Flowsheets (Taken 03/17/2021 1252) LTG: Pt will perform simple meal prep with assistance level of: Independent with assistive device   Problem: RH Toilet Transfers Goal: LTG Patient will perform toilet transfers w/assist (OT) Description: LTG: Patient will perform toilet transfers with assist, with/without cues using equipment (OT) Flowsheets (Taken 03/17/2021 1252) LTG: Pt will perform toilet transfers with assistance level of: Independent with assistive device   Problem: RH Tub/Shower Transfers Goal: LTG Patient will perform tub/shower transfers w/assist (OT) Description: LTG: Patient will perform tub/shower transfers with assist, with/without cues using equipment (OT) Flowsheets (Taken 03/17/2021 1252) LTG: Pt will perform tub/shower stall transfers with assistance level of: Independent with assistive device

## 2021-03-17 NOTE — Plan of Care (Signed)
  Problem: RH SAFETY Goal: RH STG ADHERE TO SAFETY PRECAUTIONS W/ASSISTANCE/DEVICE Description: STG Adhere to Safety Precautions With Assistance/Device. Outcome: Progressing   Problem: RH PAIN MANAGEMENT Goal: RH STG PAIN MANAGED AT OR BELOW PT'S PAIN GOAL Outcome: Progressing   Problem: RH SKIN INTEGRITY Goal: RH STG ABLE TO PERFORM INCISION/WOUND CARE W/ASSISTANCE Description: STG Able To Perform Incision/Wound Care With Assistance. Outcome: Progressing   Problem: RH SKIN INTEGRITY Goal: RH STG SKIN FREE OF INFECTION/BREAKDOWN Outcome: Progressing   Problem: RH SKIN INTEGRITY Goal: RH STG SKIN FREE OF INFECTION/BREAKDOWN Outcome: Progressing   Problem: RH SKIN INTEGRITY Goal: RH STG ABLE TO PERFORM INCISION/WOUND CARE W/ASSISTANCE Description: STG Able To Perform Incision/Wound Care With Assistance. Outcome: Progressing   Problem: RH SAFETY Goal: RH STG ADHERE TO SAFETY PRECAUTIONS W/ASSISTANCE/DEVICE Description: STG Adhere to Safety Precautions With Assistance/Device. Outcome: Progressing   Problem: RH PAIN MANAGEMENT Goal: RH STG PAIN MANAGED AT OR BELOW PT'S PAIN GOAL Outcome: Progressing

## 2021-03-17 NOTE — Evaluation (Signed)
Occupational Therapy Assessment and Plan  Patient Details  Name: Duane Hall MRN: [id number] Date of Birth: [birth year]  OT Diagnosis: abnormal posture, acute pain, muscle weakness (generalized) and decreased activity tolerance, static standing balance, and problem solving for functional mobility Rehab Potential: Rehab Potential (ACUTE ONLY): Good ELOS: 12 to 14 days   Today's Date: 03/17/2021 OT Individual Time: 2956-2130 OT Individual Time Calculation (min): 69 min     Hospital Problem: Principal Problem:   Gangrene of right foot ([place]) Active Problems:   Diabetes mellitus type 2 in nonobese ([place])   ESRD on hemodialysis ([place])   Right BKA infection ([place])   Past Medical History:  Past Medical History:  Diagnosis Date  . Asthma   . Cancer ([place])   . Cataract   . CKD (chronic kidney disease), stage III ([place])   . Diabetes mellitus   . Glaucoma   . Vascular insufficiency 05/2020   Past Surgical History:  Past Surgical History:  Procedure Laterality Date  . ABDOMINAL AORTOGRAM W/LOWER EXTREMITY N/A 06/19/2020   Procedure: ABDOMINAL AORTOGRAM W/LOWER EXTREMITY;  Surgeon: Waynetta Sandy, MD;  Location: [facility];  Service: Cardiovascular;  Laterality: N/A;  . ABDOMINAL AORTOGRAM W/LOWER EXTREMITY Left 10/16/2020   Procedure: ABDOMINAL AORTOGRAM W/LOWER EXTREMITY;  Surgeon: Waynetta Sandy, MD;  Location: [facility];  Service: Cardiovascular;  Laterality: Left;  . ABDOMINAL AORTOGRAM W/LOWER EXTREMITY N/A 12/25/2020   Procedure: ABDOMINAL AORTOGRAM W/LOWER EXTREMITY;  Surgeon: Waynetta Sandy, MD;  Location: [facility];  Service: Cardiovascular;  Laterality: N/A;  . AMPUTATION Right 01/12/2021   Procedure: 1ST AMPUTATION RAY;  Surgeon: [name], DPM;  Location: [facility];  Service: Podiatry;  Laterality: Right;  . AMPUTATION Right 03/13/2021   Procedure: RIGHT BELOW KNEE AMPUTATION;  Surgeon: Waynetta Sandy, MD;   Location: [facility];  Service: Vascular;  Laterality: Right;  . AV FISTULA PLACEMENT Right 09/14/2019   Procedure: Right Arm Basilic Vein transposition;  Surgeon: Angelia Mould, MD;  Location: [facility];  Service: Vascular;  Laterality: Right;  . BONE BIOPSY Left 07/29/2020   Procedure: BONE BIOPSY;  Surgeon: [name], DPM;  Location: [facility];  Service: Podiatry;  Laterality: Left;  Need bone trephines and/or large bore Giamshidi  . COLONOSCOPY    . PERIPHERAL VASCULAR ATHERECTOMY Left 06/19/2020   Procedure: PERIPHERAL VASCULAR ATHERECTOMY;  Surgeon: Waynetta Sandy, MD;  Location: [facility];  Service: Cardiovascular;  Laterality: Left;  SFA  . PERIPHERAL VASCULAR ATHERECTOMY  12/25/2020   Procedure: PERIPHERAL VASCULAR ATHERECTOMY;  Surgeon: Waynetta Sandy, MD;  Location: [facility];  Service: Cardiovascular;;  Lt. PT - Laser Lt. SFA - Laser  . PERIPHERAL VASCULAR BALLOON ANGIOPLASTY  12/25/2020   Procedure: PERIPHERAL VASCULAR BALLOON ANGIOPLASTY;  Surgeon: Waynetta Sandy, MD;  Location: [facility];  Service: Cardiovascular;;  Lt. SFA and PT  . UPPER GASTROINTESTINAL ENDOSCOPY  02/2019   Dr Benson [place]      Assessment & Plan Clinical Impression: Patient is a 65 y.o. year old male with history ofend-stage renal disease on hemodialysis as well as diabetes and peripheral vascular disease. He developed a ulcer on the right toe with gangrene that required amputation in January of this year. This wound continued to progress. He was admitted to the emergency room on March 07, 2021 with sepsis and infection of this ulcer. X-rays revealed questionable pathologic fractures in the foot as well. MRI ultimately revealed impacted  fractures of the second and third metatarsal necks. The patient failed antibiotics and more conservative measures conservative measures and ultimately he underwent a right below-knee amputation on March 15,2022. Course has  been complicated by right stump pain as well as poor appetite and nausea related to antibiotics. He has been maintained on dialysis per nephrology services here at the hospital Patient transferred to [facility] on 03/16/2021 .    Patient currently requires CGA to mod A with basic self-care skills secondary to muscle weakness and decreased standing balance, pain, decreased cardiorespiratoy endurance and decreased problem solving and decreased safety awareness.  Prior to hospitalization, patient could complete BADL/IADL with independent .  Patient will benefit from skilled intervention to increase independence with basic self-care skills prior to discharge home with care partner.  Anticipate patient will require intermittent supervision and follow up home health.  OT - End of Session Activity Tolerance: Tolerates 10 - 20 min activity with multiple rests Endurance Deficit: Yes Endurance Deficit Description: Generalized deconditioning, benefits from rest breaks for recovery, req increased time to complete ADL OT Assessment Rehab Potential (ACUTE ONLY): Good OT Patient demonstrates impairments in the following area(s): Cognition;Balance;Sensory;Skin Integrity;Endurance;Motor;Safety;Pain OT Basic ADL's Functional Problem(s): Grooming;Bathing;Dressing;Toileting OT Advanced ADL's Functional Problem(s): Simple Meal Preparation OT Transfers Functional Problem(s): Toilet;Tub/Shower OT Additional Impairment(s): None OT Plan OT Intensity: Minimum of 1-2 x/day, 45 to 90 minutes OT Frequency: 5 out of 7 days OT Duration/Estimated Length of Stay: 12 to 14 days OT Treatment/Interventions: Balance/vestibular training;Disease mangement/prevention;Neuromuscular re-education;Self Care/advanced ADL retraining;Therapeutic Exercise;Wheelchair propulsion/positioning;UE/LE Strength taining/ROM;Skin care/wound managment;Pain management;Cognitive remediation/compensation;DME/adaptive equipment instruction;Community  reintegration;Patient/family education;UE/LE Coordination activities;Therapeutic Activities;Psychosocial support;Functional mobility training;Discharge planning OT Self Feeding Anticipated Outcome(s): ind OT Basic Self-Care Anticipated Outcome(s): mod I OT Toileting Anticipated Outcome(s): mod I OT Bathroom Transfers Anticipated Outcome(s): mod I OT Recommendation Recommendations for Other Services: Neuropsych consult Patient destination: Home Follow Up Recommendations: Home health OT Equipment Recommended: 3 in 1 bedside comode;To be determined (drop arm 3in1)   OT Evaluation Precautions/Restrictions  Precautions Precautions: Fall Precaution Comments: R BKA Restrictions Weight Bearing Restrictions: Yes RLE Weight Bearing: Non weight bearing General Chart Reviewed: Yes Family/Caregiver Present: No Vital Signs  Pain Pain Assessment Pain Scale: 0-10 Pain Score: 5  Pain Type: Surgical pain Pain Descriptors / Indicators: Aching Pain Intervention(s): Medication (See eMAR) Home Living/Prior [place] expects to be discharged to:: Private residence Living Arrangements: Spouse/significant other Available Help at Discharge: Family Type of Home: House Home Access: Stairs to enter Technical brewer of Steps: 7 Entrance Stairs-Rails: Right,Left Home Layout: One level Bathroom Shower/Tub: Multimedia programmer: Standard Bathroom Accessibility: Yes Additional Comments: Has built in shower chair and handicap grab bars  Lives With: Spouse IADL History Occupation: Retired Prior Function Level of Independence: Independent with gait,Independent with transfers,Independent with basic ADLs,Independent with homemaking with ambulation  Able to Take Stairs?: Yes Driving: Yes Vocation: Retired Comments: was independent prior to toe amputation, including driving Vision Baseline Vision/History: Wears glasses Wears Glasses: At all times Patient  Visual Report: No change from baseline Vision Assessment?: No apparent visual deficits Additional Comments: He needed all lights on in the room and curtain open - reports he has trouble seeing in the dark Perception  Perception: Within Functional Limits Praxis Praxis: Intact Cognition Overall Cognitive Status: No family/caregiver present to determine baseline cognitive functioning (grossly [facility] for ADL, primarily difficulty problem solving for functional mobility) Arousal/Alertness: Awake/alert Orientation Level: Person;Place;Situation Person: Oriented Place: Oriented Situation: Oriented Year: 2022 Month: March Day of Week: Correct Memory: Appears  intact Immediate Memory Recall: Bed;Blue;Sock Memory Recall Sock: Without Cue Memory Recall Blue: Without Cue Memory Recall Bed: Without Cue Attention: Sustained Sustained Attention: Appears intact Awareness: Appears intact Problem Solving: Impaired Problem Solving Impairment: Verbal complex;Functional complex Executive Function: Sequencing Sequencing: Impaired Sequencing Impairment: Functional complex Safety/Judgment: Impaired Risk analyst Light Touch: Appears Intact (intact BUE) Hot/Cold: Appears Intact Proprioception: Appears Intact Stereognosis: Appears Intact Additional Comments: reports hx of neuropathy at baseline Coordination Gross Motor Movements are Fluid and Coordinated: No Fine Motor Movements are Fluid and Coordinated: Yes Coordination and Movement Description: Effortful and pain limiting due to BKA Motor  Motor Motor: Abnormal postural alignment and control;Other (comment) Motor - Skilled Clinical Observations: Generalized weakness considering age  Trunk/Postural Assessment  Cervical Assessment Cervical Assessment: Exceptions to [facility] (forward head) Thoracic Assessment Thoracic Assessment: Exceptions to [facility] (Rounded shoulders) Lumbar Assessment Lumbar Assessment: Exceptions to [facility] (posterior pelvic  tilt) Postural Control Postural Control: Deficits on evaluation (decreased static standing balance)  Balance Balance Balance Assessed: Yes Static Sitting Balance Static Sitting - Balance Support: No upper extremity supported;Feet supported Static Sitting - Level of Assistance: 5: Stand by assistance Dynamic Sitting Balance Dynamic Sitting - Balance Support: No upper extremity supported;Feet supported;During functional activity Dynamic Sitting - Level of Assistance: 5: Stand by assistance Static Standing Balance Static Standing - Balance Support: Bilateral upper extremity supported;During functional activity Static Standing - Level of Assistance: 4: Min assist;5: Stand by assistance Extremity/Trunk Assessment RUE Assessment RUE Assessment: Within Functional Limits LUE Assessment LUE Assessment: Within Functional Limits  Care Tool Care Tool Self Care Eating   Eating Assist Level: Independent    Oral Care    Oral Care Assist Level: Supervision/Verbal cueing (seated at sink)    Bathing   Body parts bathed by patient: Right arm;Left arm;Chest;Abdomen;Right upper leg;Left upper leg;Left lower leg Body parts bathed by helper:  (declined pericare) Body parts n/a: Right lower leg Assist Level: Moderate Assistance - Patient 50 - 74% (mod for STS)    Upper Body Dressing(including orthotics)   What is the patient wearing?: Hospital gown only;Pull over shirt   Assist Level: Supervision/Verbal cueing    Lower Body Dressing (excluding footwear)   What is the patient wearing?: Pants Assist for lower body dressing: Moderate Assistance - Patient 50 - 74%    Putting on/Taking off footwear   What is the patient wearing?: Non-skid slipper socks Assist for footwear: Minimal Assistance - Patient > 75%       Care Tool Toileting Toileting activity   Assist for toileting: Moderate Assistance - Patient 50 - 74%     Care Tool Bed Mobility Roll left and right activity   Roll left and right  assist level: Supervision/Verbal cueing    Sit to lying activity   Sit to lying assist level: Supervision/Verbal cueing    Lying to sitting edge of bed activity   Lying to sitting edge of bed assist level: Supervision/Verbal cueing     Care Tool Transfers Sit to stand transfer Sit to stand activity did not occur: Refused Sit to stand assist level: Moderate Assistance - Patient 50 - 74%    Chair/bed transfer   Chair/bed transfer assist level: Contact Guard/Touching assist (squat-pivot)     Toilet transfer   Assist Level: Contact Guard/Touching assist (squat-pivot to [facility])     Care Tool Cognition Expression of Ideas and Wants Expression of Ideas and Wants: Without difficulty (complex and basic) - expresses complex messages without difficulty and with speech that is clear and easy  to understand   Understanding Verbal and Non-Verbal Content Understanding Verbal and Non-Verbal Content: Usually understands - understands most conversations, but misses some part/intent of message. Requires cues at times to understand   Memory/Recall Ability *first 3 days only Memory/Recall Ability *first 3 days only: Location of own room;Current season;Staff names and faces;That he or she is in a hospital/hospital unit    Refer to Care Plan for [place] 1 OT Short Term Goal 1 (Week 1): Pt will complete BSC/toilet transfer with close S. OT Short Term Goal 2 (Week 1): Pt will complete STS in prep for standing ADL with CGA. OT Short Term Goal 3 (Week 1): Pt will complete toileting with min A. OT Short Term Goal 4 (Week 1): Pt will incorporate at least 2 amputee education points to incorporate into daily routine (skin check, desensitizaiton tech, etc).  Recommendations for other services: Neuropsych   Skilled Therapeutic Intervention ADL ADL Eating: Independent Where Assessed-Eating: Wheelchair;Bed level Grooming: Supervision/safety Where Assessed-Grooming: Sitting at  sink;Edge of bed Upper Body Bathing: Supervision/safety Where Assessed-Upper Body Bathing: Edge of bed Lower Body Bathing: Moderate assistance Where Assessed-Lower Body Bathing: Edge of bed Upper Body Dressing: Supervision/safety Where Assessed-Upper Body Dressing: Edge of bed Lower Body Dressing: Moderate assistance Where Assessed-Lower Body Dressing: Wheelchair Toileting: Moderate assistance Where Assessed-Toileting: Bedside Commode Toilet Transfer: Contact guard Toilet Transfer Method: Squat pivot Science writer: Extra wide bedside commode;Drop arm bedside commode Social research officer, government: Minimal assistance Social research officer, government Method: Education officer, environmental: Transfer tub bench Mobility  Bed Mobility Bed Mobility: Supine to Sit;Sit to Supine;Scooting to HOB Supine to Sit: Supervision/Verbal cueing Sit to Supine: Supervision/Verbal cueing Scooting to [facility]: Supervision/Verbal Cueing Transfers Sit to Stand: Moderate Assistance - Patient 50-74% Stand to Sit: Moderate Assistance - Patient 50-74%  Session Note: Pt received semi-reclined in bed, agreeable to OT eval with encouragement and therapeutic rapport building. Reviewed role of CIR OT, evaluation process, ADL/func mobility retraining, goals for therapy, and safety plan. Evaluation completed as documented above with session focus on func mobility, EOB dressing/bathing, seated grooming at sink. Pt completed supine > sitting EOB with increased time and close S. C/o of R residual limb pain, RN present and administering rx during session. Completes UBB and UBD close S. Squat-pivot to w/c with close S, pt req A for w/c management, but resistive to A. Reports he does not like when staff reaches close to him because it "throws him off." Donned pants with mod A for STS with RW, able to thread BLE, but require mod A for initial lift and increased time to problem solve hand placement, initially resistive to VCs for  sequencing from therapist. Washed hair at sink with hair washing tray, able to dry hair and complete oral care close S. Squat-pivot back to bed same manner as before, although pt initiating he already knew to remove arm rest, but did not initiate. Sitting EOB > supine with close S. Education re desensitization techniques and R residual limb positioning, will req cont to benefit from further edu due to limited receptiveness.  Pt left semi-reclined in bed with call bell in reach, bed alarm engaged, and all immediate needs met.     Discharge Criteria: Patient will be discharged from OT if patient refuses treatment 3 consecutive times without medical reason, if treatment goals not met, if there is a change in medical status, if patient makes no progress towards goals or if patient is discharged from hospital.  The above assessment, treatment plan, treatment alternatives and goals were discussed and mutually agreed upon: by patient  Volanda Napoleon MS, OTR/L  03/17/2021, 12:34 PM

## 2021-03-17 NOTE — Progress Notes (Signed)
Bilateral lower extremity venous duplex has been completed. Preliminary results can be found in CV Proc through chart review.   03/17/21 11:01 AM Duane Hall RVT

## 2021-03-17 NOTE — Progress Notes (Signed)
Inpatient Rehabilitation Medication Review by a Pharmacist  A complete drug regimen review was completed for this patient to identify any potential clinically significant medication issues.  Clinically significant medication issues were identified:  no  Check AMION for pharmacist assigned to patient if future medication questions/issues arise during this admission.  Pharmacist comments:  Medications reviewed - no noted issues He has an insulin pump listed on his home meds - may need to review this at discharge.  Time spent performing this drug regimen review (minutes):  10 minutes   Rober Minion, PharmD. 03/16/2021 6:41 PM

## 2021-03-17 NOTE — Evaluation (Signed)
Physical Therapy Assessment and Plan  Patient Details  Name: Duane Hall MRN: [id number] Date of Birth: Jul 13, 1956  PT Diagnosis: Abnormality of gait, Difficulty walking, Muscle weakness and Pain in R BKA Rehab Potential: Good ELOS: 2 weeks   Today's Date: 03/17/2021 PT Individual Time: 0800-0858 PT Individual Time Calculation (min): 76 min    Hospital Problem: Principal Problem:   Gangrene of right foot ([place]) Active Problems:   Diabetes mellitus type 2 in nonobese ([place])   ESRD on hemodialysis ([place])   Right BKA infection ([place])   Past Medical History:  Past Medical History:  Diagnosis Date  . Asthma   . Cancer ([place])   . Cataract   . CKD (chronic kidney disease), stage III ([place])   . Diabetes mellitus   . Glaucoma   . Vascular insufficiency 05/2020   Past Surgical History:  Past Surgical History:  Procedure Laterality Date  . ABDOMINAL AORTOGRAM W/LOWER EXTREMITY N/A 06/19/2020   Procedure: ABDOMINAL AORTOGRAM W/LOWER EXTREMITY;  Surgeon: Waynetta Sandy, MD;  Location: [place] [facility];  Service: Cardiovascular;  Laterality: N/A;  . ABDOMINAL AORTOGRAM W/LOWER EXTREMITY Left 10/16/2020   Procedure: ABDOMINAL AORTOGRAM W/LOWER EXTREMITY;  Surgeon: Waynetta Sandy, MD;  Location: [place] [facility];  Service: Cardiovascular;  Laterality: Left;  . ABDOMINAL AORTOGRAM W/LOWER EXTREMITY N/A 12/25/2020   Procedure: ABDOMINAL AORTOGRAM W/LOWER EXTREMITY;  Surgeon: Waynetta Sandy, MD;  Location: [place] [facility];  Service: Cardiovascular;  Laterality: N/A;  . AMPUTATION Right 01/12/2021   Procedure: 1ST AMPUTATION RAY;  Surgeon: [name], DPM;  Location: [place];  Service: Podiatry;  Laterality: Right;  . AMPUTATION Right 03/13/2021   Procedure: RIGHT BELOW KNEE AMPUTATION;  Surgeon: Waynetta Sandy, MD;  Location: [place];  Service: Vascular;  Laterality: Right;  . AV FISTULA PLACEMENT Right 09/14/2019   Procedure: Right Arm Basilic  Vein transposition;  Surgeon: Angelia Mould, MD;  Location: [place];  Service: Vascular;  Laterality: Right;  . BONE BIOPSY Left 07/29/2020   Procedure: BONE BIOPSY;  Surgeon: [name], DPM;  Location: [facility];  Service: Podiatry;  Laterality: Left;  Need bone trephines and/or large bore Giamshidi  . COLONOSCOPY    . PERIPHERAL VASCULAR ATHERECTOMY Left 06/19/2020   Procedure: PERIPHERAL VASCULAR ATHERECTOMY;  Surgeon: Waynetta Sandy, MD;  Location: [place] [facility];  Service: Cardiovascular;  Laterality: Left;  SFA  . PERIPHERAL VASCULAR ATHERECTOMY  12/25/2020   Procedure: PERIPHERAL VASCULAR ATHERECTOMY;  Surgeon: Waynetta Sandy, MD;  Location: [facility] [facility];  Service: Cardiovascular;;  Lt. PT - Laser Lt. SFA - Laser  . PERIPHERAL VASCULAR BALLOON ANGIOPLASTY  12/25/2020   Procedure: PERIPHERAL VASCULAR BALLOON ANGIOPLASTY;  Surgeon: Waynetta Sandy, MD;  Location: [facility];  Service: Cardiovascular;;  Lt. SFA and PT  . UPPER GASTROINTESTINAL ENDOSCOPY  02/2019   Dr Benson [place]      Assessment & Plan Clinical Impression: Patient is a 65 year old white male with a history ofend-stage renal disease on hemodialysis as well as diabetes and peripheral vascular disease. He developed a ulcer on the right toe with gangrene that required amputation in January of this year. This wound continued to progress. He was admitted to the emergency room on March 07, 2021 with sepsis and infection of this ulcer. X-rays revealed questionable pathologic fractures in the foot as well. MRI ultimately revealed impacted fractures of the second and third metatarsal necks. The patient failed antibiotics and more conservative measures  conservative measures and ultimately he underwent a right below-knee amputation on March 15,2022. Course has been complicated by right stump pain as well as poor appetite and nausea related to antibiotics. He has been maintained on  dialysis per nephrology services here at the hospital. Patient was ultimately evaluated by physical and Occupational Therapy and after review by the rehab admissions team it was felt that he would benefit from an inpatient rehab stay. Patient transferred to CIR on 03/16/2021 .   Patient currently requires min with mobility secondary to muscle weakness and muscle joint tightness, decreased cardiorespiratoy endurance and decreased standing balance and decreased balance strategies.  Prior to hospitalization, patient was independent  with mobility and lived with Spouse in a House home.  Home access is 7Stairs to enter.  Patient will benefit from skilled PT intervention to maximize safe functional mobility, minimize fall risk and decrease caregiver burden for planned discharge home with intermittent assist.  Anticipate patient will benefit from follow up [facility] at discharge.  PT - End of Session Activity Tolerance: Tolerates 10 - 20 min activity with multiple rests Endurance Deficit: Yes Endurance Deficit Description: Generalized deconditioning, benefits from rest breaks for recovery PT Assessment Rehab Potential (ACUTE/IP ONLY): Good PT Barriers to Discharge: Inaccessible home environment;Home environment access/layout;Decreased caregiver support;Lack of/limited family support;Insurance for SNF coverage;Behavior;Weight bearing restrictions;Wound Care PT Barriers to Discharge Comments: 7 STE home PT Patient demonstrates impairments in the following area(s): Balance;Behavior;Endurance;Motor;Sensory;Safety;Skin Integrity PT Transfers Functional Problem(s): Bed Mobility;Bed to Chair;Car PT Locomotion Functional Problem(s): Ambulation;Wheelchair Mobility;Stairs PT Plan PT Intensity: Minimum of 1-2 x/day ,45 to 90 minutes PT Frequency: 5 out of 7 days PT Duration Estimated Length of Stay: 2 weeks PT Treatment/Interventions: Ambulation/gait training;Discharge planning;Functional mobility training;Psychosocial  support;Therapeutic Activities;Visual/perceptual remediation/compensation;Wheelchair propulsion/positioning;Therapeutic Exercise;Skin care/wound management;Neuromuscular re-education;Disease management/prevention;Balance/vestibular training;Cognitive remediation/compensation;DME/adaptive equipment instruction;Pain management;Splinting/orthotics;UE/LE Strength taining/ROM;UE/LE Coordination activities;Stair training;Patient/family education;Functional electrical stimulation;Community reintegration PT Transfers Anticipated Outcome(s): supervision with LRAD PT Locomotion Anticipated Outcome(s): supervision with LRAD PT Recommendation Recommendations for Other Services: Neuropsych consult Follow Up Recommendations: Home health PT Patient destination: Home Equipment Recommended: To be determined Equipment Details: Pt owns RW and w/c   Skilled Intervention:  1st session: Pt received sitting EOB with NT at bedside, hand off of care. Pt appearing highly anxious regarding functional mobility - contributes to both R BKA pain and fearfulness of falling. He is very particular with needs/wants and gets flustered easily when things are changed on him. He reports he has "trust issues" and does apologize sometimes during our session. He requested RN be present to apply dry dressing to R BKA - RN made aware who arrived and assisted with dressing change. While this was being completed, educated patient on limb loss, bed/chair positioning, support groups, etc. Also retrieved 18x18 w/c and RW from DME closet. Initiated functional mobility as outlined below. He required supervision for bed mobility, able to sit unsupported with SBA, CGA for squat<>pivot transfers, and supervision for w/c mobility. Due to time constraints, unable to assess car transfers or gait. He remained seated in w/c at end of session with needs in reach, made comfortable.  Instructed pt in results of PT evaluation as detailed above, PT POC, rehab  potential, rehab goals, and discharge recommendations. Additionally discussed CIR's policies regarding fall safety and use of chair alarm and/or quick release belt. Pt verbalized understanding and in agreement. Will update pt's family members as they become available.   2nd session: Pt greeted supine in bed with wife at bedside, pt reporting fatigue and thought he was  finished with therapies for the day - provided education to both him and wife regarding rehab intensity (3hrs/day) and typical therapy schedule. Also had lengthy discussion with wife regarding DC planning and current barriers to DC (I.e stairs, functional transfers, etc). Wife reports that she has reached out to a local ministry for assistance on getting a permanent ramp installed for their backdoor. Also discussed pt's PLOF, recent weight loss, somewhat sedentary lifestyle, etc. Wife reports she can provide supervision at home but limited physical assist. Wife appreciative of therapy ed and discussion, all questions and concerns addressed. Pt remaining in bed, missed 13 minutes of skilled therapy due to fatigue. Bed alarm on and needs within reach.  PT Evaluation Precautions/Restrictions Precautions Precautions: Fall Precaution Comments: R BKA Restrictions Weight Bearing Restrictions: Yes RLE Weight Bearing: Non weight bearing General Chart Reviewed: Yes Additional Pertinent History: history of end-stage renal disease on hemodialysis as well as diabetes and peripheral vascular disease. Family/Caregiver Present: No Vital Signs Pain Pain Assessment Pain Scale: 0-10 Pain Score: 0-No pain Home Living/Prior Functioning Home Living Available Help at Discharge: Family Type of Home: House Home Access: Stairs to enter Technical brewer of Steps: 7 Entrance Stairs-Rails: Right;Left (can only reach 1 at a time) Home Layout: One level Bathroom Shower/Tub: Multimedia programmer: Standard Bathroom Accessibility:  Yes Additional Comments: Has built in shower chair and handicap grab bars  Lives With: Spouse Prior Function Level of Independence: Independent with gait;Independent with transfers  Able to Take Stairs?: Yes Driving: Yes Vocation: Retired Radiographer, therapeutic - Assessment Additional Comments: He needed all lights on in the room and curtain open - reports he has trouble seeing in the dark Perception Perception: Within Functional Limits Praxis Praxis: Intact  Cognition Arousal/Alertness: Awake/alert Orientation Level: Oriented X4 Problem Solving: Impaired Problem Solving Impairment: Verbal complex;Functional complex Executive Function: Sequencing Sequencing: Impaired Sequencing Impairment: Functional complex Safety/Judgment: Impaired Magazine features editor: Appears Intact Hot/Cold: Appears Intact Proprioception: Appears Intact Stereognosis: Appears Intact Coordination Gross Motor Movements are Fluid and Coordinated: No Fine Motor Movements are Fluid and Coordinated: Yes Coordination and Movement Description: Effortful and pain limiting due to BKA Motor  Motor Motor: Abnormal postural alignment and control;Other (comment) Motor - Skilled Clinical Observations: Generalized weakness considering age  Trunk/Postural Assessment  Cervical Assessment Cervical Assessment: Exceptions to [facility] (forward head) Thoracic Assessment Thoracic Assessment: Exceptions to [facility] (Rounded shoulders) Lumbar Assessment Lumbar Assessment: Exceptions to [facility] (posterior pelvic tilt) Postural Control Postural Control: Within Functional Limits  Balance Balance Balance Assessed: Yes Static Sitting Balance Static Sitting - Balance Support: No upper extremity supported;Feet supported (L foot supported (R BKA() Static Sitting - Level of Assistance: 5: Stand by assistance Dynamic Sitting Balance Dynamic Sitting - Balance Support: No upper extremity supported;Feet supported;During functional  activity Dynamic Sitting - Level of Assistance: 5: Stand by assistance Extremity Assessment      RLE Assessment RLE Assessment: Exceptions to [facility] Active Range of Motion (AROM) Comments: knee ext lacking ~15deg (pain limiting) General Strength Comments: hip flex 3+/5, knee ext 2+/5 LLE Assessment LLE Assessment: Within Functional Limits General Strength Comments: Grossly 4+/5  Care Tool Care Tool Bed Mobility Roll left and right activity   Roll left and right assist level: Supervision/Verbal cueing    Sit to lying activity   Sit to lying assist level: Supervision/Verbal cueing    Lying to sitting edge of bed activity   Lying to sitting edge of bed assist level: Supervision/Verbal cueing     Care Tool Transfers Sit  to stand transfer Sit to stand activity did not occur: Refused      Chair/bed transfer   Chair/bed transfer assist level: Contact Guard/Touching assist (squat pivot)     Psychologist, counselling transfer activity did not occur: Safety/medical concerns        Care Tool Locomotion Ambulation Ambulation activity did not occur: Safety/medical concerns        Walk 10 feet activity Walk 10 feet activity did not occur: Safety/medical concerns       Walk 50 feet with 2 turns activity Walk 50 feet with 2 turns activity did not occur: Safety/medical concerns      Walk 150 feet activity Walk 150 feet activity did not occur: Safety/medical concerns      Walk 10 feet on uneven surfaces activity Walk 10 feet on uneven surfaces activity did not occur: Safety/medical concerns      Stairs Stair activity did not occur: Safety/medical concerns        Walk up/down 1 step activity Walk up/down 1 step or curb (drop down) activity did not occur: Safety/medical concerns     Walk up/down 4 steps activity did not occuR: Safety/medical concerns  Walk up/down 4 steps activity      Walk up/down 12 steps activity Walk up/down 12 steps activity did not  occur: Safety/medical concerns      Pick up small objects from floor Pick up small object from the floor (from standing position) activity did not occur: Safety/medical concerns      Wheelchair Will patient use wheelchair at discharge?: Yes Type of Wheelchair: Manual   Wheelchair assist level: Supervision/Verbal cueing Max wheelchair distance: 181f  Wheel 50 feet with 2 turns activity   Assist Level: Supervision/Verbal cueing  Wheel 150 feet activity Wheelchair 150 feet activity did not occur: Safety/medical concerns (fatigue)      Refer to Care Plan for Long Term Goals  SHORT TERM GOAL WEEK 1 PT Short Term Goal 1 (Week 1): Pt will complete bed<>chair transfers with supervision PT Short Term Goal 2 (Week 1): Pt will propel himself in w/c 1551fwith supervision PT Short Term Goal 3 (Week 1): Pt will ambulate 1058fith minA and LRAD PT Short Term Goal 4 (Week 1): Pt will initiate stair training  Recommendations for other services: Neuropsych  Skilled Therapeutic Intervention Mobility Bed Mobility Bed Mobility: Supine to Sit;Sit to Supine Supine to Sit: Supervision/Verbal cueing Sit to Supine: Supervision/Verbal cueing Transfers Transfers: Squat Pivot Transfers Squat Pivot Transfers: Contact Guard/Touching assist Transfer (Assistive device): None Locomotion  Gait Ambulation: No Stairs / Additional Locomotion Stairs: No Wheelchair Mobility Wheelchair Mobility: Yes Wheelchair Assistance: SupChartered loss adjusteroth upper extremities Wheelchair Parts Management: Needs assistance Distance: [id number] Criteria: Patient will be discharged from PT if patient refuses treatment 3 consecutive times without medical reason, if treatment goals not met, if there is a change in medical status, if patient makes no progress towards goals or if patient is discharged from hospital.  The above assessment, treatment plan, treatment alternatives and goals  were discussed and mutually agreed upon: by patient  ChriAlger Simons DPT 03/17/2021, 10:33 AM

## 2021-03-18 DIAGNOSIS — Z89511 Acquired absence of right leg below knee: Secondary | ICD-10-CM

## 2021-03-18 LAB — GLUCOSE, CAPILLARY
Glucose-Capillary: 198 mg/dL — ABNORMAL HIGH (ref 70–99)
Glucose-Capillary: 184 mg/dL — ABNORMAL HIGH (ref 70–99)
Glucose-Capillary: 92 mg/dL (ref 70–99)
Glucose-Capillary: 123 mg/dL — ABNORMAL HIGH (ref 70–99)

## 2021-03-18 MED ORDER — SODIUM CHLORIDE 0.9 % IV SOLN: 100.0000 mL | INTRAVENOUS | Status: DC | PRN

## 2021-03-18 MED ORDER — PENTAFLUOROPROP-TETRAFLUOROETH EX AERO: 1.0000 "application " | INHALATION_SPRAY | CUTANEOUS | Status: DC | PRN

## 2021-03-18 MED ORDER — LIDOCAINE-PRILOCAINE 2.5-2.5 % EX CREA
1.0000 "application " | TOPICAL_CREAM | CUTANEOUS | Status: DC | PRN
  Filled 2021-03-18: qty 5

## 2021-03-18 MED ORDER — HEPARIN SODIUM (PORCINE) 1000 UNIT/ML DIALYSIS
1000.0000 [IU] | INTRAMUSCULAR | Status: DC | PRN
  Filled 2021-03-18: qty 1

## 2021-03-18 MED ORDER — CHLORHEXIDINE GLUCONATE CLOTH 2 % EX PADS: 6.0000 | MEDICATED_PAD | Freq: Every day | CUTANEOUS | Status: DC

## 2021-03-18 MED ORDER — HEPARIN SODIUM (PORCINE) 1000 UNIT/ML DIALYSIS
3000.0000 [IU] | Freq: Once | INTRAMUSCULAR | Status: DC
  Filled 2021-03-18: qty 3

## 2021-03-18 MED ORDER — LIDOCAINE HCL (PF) 1 % IJ SOLN
5.0000 mL | INTRAMUSCULAR | Status: DC | PRN
Start: 2021-03-18 — End: 2021-03-26

## 2021-03-18 NOTE — IPOC Note (Signed)
Overall Plan of Care [facility]) Patient Details Name: Duane Hall MRN: [id number] DOB: 1956-10-31  Admitting Diagnosis: Gangrene of right foot [facility])  Hospital Problems: Principal Problem:   Gangrene of right foot ([place]) Active Problems:   Diabetes mellitus type 2 in nonobese ([place])   ESRD on hemodialysis ([place])   Right BKA infection ([place])   S/P BKA (below knee amputation) unilateral, right ([place])     Functional Problem List: Nursing Behavior,Bladder,Bowel,Edema,Endurance,Medication Management,Pain,Perception,Safety,Skin Integrity  PT Balance,Behavior,Endurance,Motor,Sensory,Safety,Skin Integrity  OT Cognition,Balance,Sensory,Skin Integrity,Endurance,Motor,Safety,Pain  SLP    TR         Basic ADLs: OT Grooming,Bathing,Dressing,Toileting     Advanced  ADLs: OT Simple Meal Preparation     Transfers: PT Bed Mobility,Bed to Chair,Car  OT Toilet,Tub/Shower     Locomotion: PT Ambulation,Wheelchair Mobility,Stairs     Additional Impairments: OT None  SLP        TR      Anticipated Outcomes Item Anticipated Outcome  Self Feeding ind  Swallowing      Basic self-care  mod I  Toileting  mod I   Bathroom Transfers mod I  Bowel/Bladder  Mod I  Transfers  supervision with LRAD  Locomotion  supervision with LRAD  Communication     Cognition     Pain  < 4  Safety/Judgment  Mod I   Therapy Plan: PT Intensity: Minimum of 1-2 x/day ,45 to 90 minutes PT Frequency: 5 out of 7 days PT Duration Estimated Length of Stay: 2 weeks OT Intensity: Minimum of 1-2 x/day, 45 to 90 minutes OT Frequency: 5 out of 7 days OT Duration/Estimated Length of Stay: 12 to 14 days     Due to the current state of emergency, patients may not be receiving their 3-hours of Medicare-mandated therapy.   Team Interventions: Nursing Interventions Patient/Family Education,Bladder Management,Bowel Management,Disease Management/Prevention,Pain Management,Medication Management,Skin Care/Wound  Management,Discharge Planning,Psychosocial Support  PT interventions Ambulation/gait training,Discharge planning,Functional mobility training,Psychosocial support,Therapeutic Activities,Visual/perceptual remediation/compensation,Wheelchair propulsion/positioning,Therapeutic Exercise,Skin care/wound management,Neuromuscular re-education,Disease management/prevention,Balance/vestibular training,Cognitive remediation/compensation,DME/adaptive equipment instruction,Pain management,Splinting/orthotics,UE/LE Strength taining/ROM,UE/LE Coordination activities,Stair training,Patient/family Social research officer, government stimulation,Community reintegration  OT Interventions Balance/vestibular training,Disease Special educational needs teacher re-education,Self Care/advanced ADL retraining,Therapeutic Exercise,Wheelchair propulsion/positioning,UE/LE Strength taining/ROM,Skin care/wound Primary school teacher remediation/compensation,DME/adaptive equipment instruction,Community reintegration,Patient/family education,UE/LE Coordination activities,Therapeutic Activities,Psychosocial support,Functional mobility training,Discharge planning  SLP Interventions    TR Interventions    SW/CM Interventions     Barriers to Discharge MD  Medical stability  Nursing Inaccessible home environment,Decreased caregiver support,Incontinence,Wound Care,Lack of/limited family support,Weight bearing restrictions,Medication compliance,Behavior    PT Inaccessible home environment,Home environment access/layout,Decreased caregiver support,Lack of/limited family support,Insurance for SNF coverage,Behavior,Weight bearing restrictions,Wound Care 7 STE home  OT      SLP      SW       Team Discharge Planning: Destination: PT-Home ,OT- Home , SLP-  Projected Follow-up: PT-Home health PT, OT-  Home health OT, SLP-  Projected Equipment Needs: PT-To be determined, OT- 3 in 1 bedside comode,To be determined (drop arm 3in1),  SLP-  Equipment Details: PT-Pt owns RW and w/c, OT-  Patient/family involved in discharge planning: PT- Patient,  OT-Patient, SLP-   MD ELOS: 10-12 days Medical Rehab Prognosis:  Excellent Assessment: The patient has been admitted for CIR therapies with the diagnosis of right bka. The team will be addressing functional mobility, strength, stamina, balance, safety, adaptive techniques and equipment, self-care, bowel and bladder mgt, patient and caregiver education, pain mgt, wound care, pre-pros education. Goals have been set at mod I for  self-care and supervision for mobility  Due to the current  state of emergency, patients may not be receiving their 3 hours per day of Medicare-mandated therapy.    Meredith Staggers, MD, FAAPMR      See Team Conference Notes for weekly updates to the plan of care

## 2021-03-18 NOTE — Progress Notes (Signed)
PROGRESS NOTE   Subjective/Complaints: Seems to be in better spirits tomorrow. Not using a lot of oxycodone but feels he needs some this morning. Wearing BK dressing. Asked about padding/cushioning for w/c to account for imbalance of not having right lower leg  ROS: Patient denies fever, rash, sore throat, blurred vision, nausea, vomiting, diarrhea, cough, shortness of breath or chest pain,   headache, or mood change.     Objective:   VAS [place] LOWER EXTREMITY VENOUS (DVT)  Result Date: 03/17/2021  Lower Venous DVT Study Indications: Swelling.  Risk Factors: Surgery Right BKA. Limitations: Bandages and Patient pain tolerance, patient positioning. Comparison Study: No prior studies. Performing Technologist: Oliver Hum RVT  Examination Guidelines: A complete evaluation includes B-mode imaging, spectral Doppler, color Doppler, and power Doppler as needed of all accessible portions of each vessel. Bilateral testing is considered an integral part of a complete examination. Limited examinations for reoccurring indications may be performed as noted. The reflux portion of the exam is performed with the patient in reverse Trendelenburg.  +---------+---------------+---------+-----------+----------+--------------+ RIGHT    CompressibilityPhasicitySpontaneityPropertiesThrombus Aging +---------+---------------+---------+-----------+----------+--------------+ CFV      Full           Yes      Yes                                 +---------+---------------+---------+-----------+----------+--------------+ SFJ      Full                                                        +---------+---------------+---------+-----------+----------+--------------+ FV Prox  Full                                                        +---------+---------------+---------+-----------+----------+--------------+ FV Mid   Full                                                         +---------+---------------+---------+-----------+----------+--------------+ FV DistalFull                                                        +---------+---------------+---------+-----------+----------+--------------+ PFV      Full                                                        +---------+---------------+---------+-----------+----------+--------------+  POP      Full           No       No                                  +---------+---------------+---------+-----------+----------+--------------+ PTV                                                   BKA            +---------+---------------+---------+-----------+----------+--------------+ PERO                                                  BKA            +---------+---------------+---------+-----------+----------+--------------+   +---------+---------------+---------+-----------+----------+--------------+ LEFT     CompressibilityPhasicitySpontaneityPropertiesThrombus Aging +---------+---------------+---------+-----------+----------+--------------+ CFV      Full           Yes      Yes                                 +---------+---------------+---------+-----------+----------+--------------+ SFJ      Full                                                        +---------+---------------+---------+-----------+----------+--------------+ FV Prox  Full                                                        +---------+---------------+---------+-----------+----------+--------------+ FV Mid   Full                                                        +---------+---------------+---------+-----------+----------+--------------+ FV DistalFull                                                        +---------+---------------+---------+-----------+----------+--------------+ PFV      Full                                                         +---------+---------------+---------+-----------+----------+--------------+ POP      Full           Yes      Yes                                 +---------+---------------+---------+-----------+----------+--------------+  PTV      Full                                                        +---------+---------------+---------+-----------+----------+--------------+ PERO     Full                                                        +---------+---------------+---------+-----------+----------+--------------+     Summary: RIGHT: - There is no evidence of deep vein thrombosis in the lower extremity. However, portions of this examination were limited- see technologist comments above.  - No cystic structure found in the popliteal fossa.  LEFT: - There is no evidence of deep vein thrombosis in the lower extremity.  - No cystic structure found in the popliteal fossa.  *See table(s) above for measurements and observations.    Preliminary    Recent Labs    03/16/21 0239  WBC 5.6  HGB 8.5*  HCT 26.8*  PLT 223   Recent Labs    03/16/21 0239  NA 138  K 4.0  CL 102  CO2 28  GLUCOSE 141*  BUN 27*  CREATININE 5.64*  CALCIUM 7.6*    Intake/Output Summary (Last 24 hours) at 03/18/2021 2505 Last data filed at 03/18/2021 0700 Gross per 24 hour  Intake 480 ml  Output --  Net 480 ml     Pressure Injury 06/19/20 Heel Left Unstageable - Full thickness tissue loss in which the base of the injury is covered by slough (yellow, tan, gray, green or brown) and/or eschar (tan, brown or black) in the wound bed. eschar  (Active)  06/19/20 2010  Location: Heel  Location Orientation: Left  Staging: Unstageable - Full thickness tissue loss in which the base of the injury is covered by slough (yellow, tan, gray, green or brown) and/or eschar (tan, brown or black) in the wound bed.  Wound Description (Comments): eschar   Present on Admission: Yes     Pressure Injury 06/19/20 Toe (Comment  which  one) Bilateral;Right;Left Stage 2 -  Partial thickness loss of dermis presenting as a shallow open injury with a red, pink wound bed without slough. scab formed on both toes (Active)  06/19/20 2010  Location: Toe (Comment  which one) (great/big toe)  Location Orientation: Bilateral;Right;Left  Staging: Stage 2 -  Partial thickness loss of dermis presenting as a shallow open injury with a red, pink wound bed without slough.  Wound Description (Comments): scab formed on both toes  Present on Admission: Yes     Pressure Injury 06/19/20 Heel Right Stage 2 -  Partial thickness loss of dermis presenting as a shallow open injury with a red, pink wound bed without slough. skin tear (Active)  06/19/20 2010  Location: Heel  Location Orientation: Right  Staging: Stage 2 -  Partial thickness loss of dermis presenting as a shallow open injury with a red, pink wound bed without slough.  Wound Description (Comments): skin tear  Present on Admission: Yes    Physical Exam: Vital Signs Blood pressure (!) 102/55, pulse 88, temperature 98.7 F (37.1 C), resp. rate 20, height 5\' 9"  (1.753 m), weight 75.5 kg, SpO2 99 %.  Constitutional: No distress . Vital signs reviewed. HEENT: EOMI, oral membranes moist Neck: supple Cardiovascular: RRR without murmur. No JVD    Respiratory/Chest: CTA Bilaterally without wheezes or rales. Normal effort    GI/Abdomen: BS +, non-tender, non-distended Ext: no clubbing, cyanosis, or edema Psych: flat, cooperative Skin: right BK with staples, cdi. Left heel with old healed/ing wound--stable appearing Neuro: Pt is cognitively appropriate with normal insight, memory, and awareness. Cranial nerves 2-12 are intact. Sensory exam is normal. Reflexes are 2+ in all 4's. Fine motor coordination is intact. No tremors. Motor function is grossly 5/5 UE and 3/5 RLE 4/5 LLE. Musculoskeletal: Right hamstrings tight. Unable to fully extend right knee.     Assessment/Plan: 1. Functional  deficits which require 3+ hours per day of interdisciplinary therapy in a comprehensive inpatient rehab setting.  Physiatrist is providing close team supervision and 24 hour management of active medical problems listed below.  Physiatrist and rehab team continue to assess barriers to discharge/monitor patient progress toward functional and medical goals  Care Tool:  Bathing    Body parts bathed by patient: Right arm,Left arm,Chest,Abdomen,Right upper leg,Left upper leg,Left lower leg   Body parts bathed by helper:  (declined pericare) Body parts n/a: Right lower leg   Bathing assist Assist Level: Moderate Assistance - Patient 50 - 74% (mod for STS)     Upper Body Dressing/Undressing Upper body dressing   What is the patient wearing?: Hospital gown only,Pull over shirt    Upper body assist Assist Level: Supervision/Verbal cueing    Lower Body Dressing/Undressing Lower body dressing      What is the patient wearing?: Pants     Lower body assist Assist for lower body dressing: Moderate Assistance - Patient 50 - 74%     Toileting Toileting    Toileting assist Assist for toileting: Moderate Assistance - Patient 50 - 74%     Transfers Chair/bed transfer  Transfers assist     Chair/bed transfer assist level: Contact Guard/Touching assist (squat-pivot)     Locomotion Ambulation   Ambulation assist   Ambulation activity did not occur: Safety/medical concerns          Walk 10 feet activity   Assist  Walk 10 feet activity did not occur: Safety/medical concerns        Walk 50 feet activity   Assist Walk 50 feet with 2 turns activity did not occur: Safety/medical concerns         Walk 150 feet activity   Assist Walk 150 feet activity did not occur: Safety/medical concerns         Walk 10 feet on uneven surface  activity   Assist Walk 10 feet on uneven surfaces activity did not occur: Safety/medical concerns          Wheelchair     Assist Will patient use wheelchair at discharge?: Yes Type of Wheelchair: Manual    Wheelchair assist level: Supervision/Verbal cueing Max wheelchair distance: 19ft    Wheelchair 50 feet with 2 turns activity    Assist        Assist Level: Supervision/Verbal cueing   Wheelchair 150 feet activity     Assist  Wheelchair 150 feet activity did not occur: Safety/medical concerns (fatigue)       Blood pressure (!) 102/55, pulse 88, temperature 98.7 F (37.1 C), resp. rate 20, height 5\' 9"  (1.753 m), weight 75.5 kg, SpO2 99 %.  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 Antithrombotics: -DVT/anticoagulation:Pharmaceutical:Heparin5000 units every 8 hours -antiplatelet therapy: 81 mg of aspirin daily,Plavix 75 mg daily 3. Pain Management:Tylenol for mild pain -Oxycodone for moderate to severe pain--using sparingly -Robaxin as needed for spasms -Gabapentin 100 mg daily for phantom limb pain - limb massage and desensitization activities as well as visual therapy have been discussed 4. Mood:Team to provide ego support as needed -antipsychotic agents: N/AA -He does appear at risk for depression. 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 Struggling with his role currently  -would benefit from neuropsych eval 5. Neuropsych: This patientiscapable of making decisions on hisown behalf. 6. Skin/Wound Care:  dry dressing with Ace wrap to right below knee amputation  stump daily  -mayFloat left heel to prevent breakdown 7. Fluids/Electrolytes/Nutrition:Encourage adequate p.o. We discussed today the importance of his nutrition as a reflects upon his strength and wound healing. -Patient with low albumin: Continue daily supplements 8.End-stage renal disease on hemodialysis. -Patient on a Monday Wednesday Friday schedule at this point -Dialysis to be scheduled after therapy to maximize therapy participation and tolerance 9.Hypotension: Observe blood pressures while on the unit and especially after hemodialysis days  -bp holding today 3/20 10.Acute on chronic anemia: Patient on Aranesp weekly. Monitor serial hemoglobins with dialysis 11.Type 2 diabetes.Borderline control at present -Continue NovoLog 3 units with meals as well as Lantus 10 units daily -CBGs before meals and at bedtime with sliding scale insulin -3/20 fair control at present. No changes for now    LOS: 2 days A FACE TO FACE EVALUATION WAS PERFORMED  Meredith Staggers 03/18/2021, 8:34 AM

## 2021-03-18 NOTE — Progress Notes (Signed)
Physical Therapy Session Note  Patient Details  Name: Duane Hall MRN: [id number] Date of Birth: 08/07/1956  Today's Date: 03/18/2021 PT Individual Time: 0916-1015 PT Individual Time Calculation (min): 59 min   Short Term Goals: Week 1:  PT Short Term Goal 1 (Week 1): Pt will complete bed<>chair transfers with supervision PT Short Term Goal 2 (Week 1): Pt will propel himself in w/c 164ft with supervision PT Short Term Goal 3 (Week 1): Pt will ambulate 59ft with minA and LRAD PT Short Term Goal 4 (Week 1): Pt will initiate stair training     Skilled Therapeutic Interventions/Progress Updates:    pt received in bed and agreeable to therapy. Pt reported no pain in bed however reported he usually has an increase with mobility of RLE. Pt directed in supine>sit SBA with extra time to complete. Pt then sat EOB for 3-4 with extra time per pt request to allow him to process plan for therapy. PT explained it would be optimal if pt could transfer to [facility], get ready to leave room and then participate in [facility] mobility and a task in gym. Pt agreed and explained to therapist he would like to do the tasks himself and be fully explained what was expected of him prior to doing task to make sure he understood and could complete them. With this technique pt demonstrated full participation with all tasks asked of him throughout session. Pt directed in donning pants at bedside CGA for stability then transferring to [facility] with lateral scooting CGA with PT assisting only in maintaining chair position initially. Pt then propelled self to sink for face washing and teeth brushing setup A for this, completed at [facility] level. Pt directed in [facility] mobility with PT demonstrating and explaining how to put on and take off leg rests with pt then able to complete and then 150' to gym SBA. Pt had multiple questions about limb positioning to decrease risk for contracture, PT educated pt on the importance of maintaining R knee extension and  midline alignment. Pt agreeable for PT to assess knee ROM, pt demonstrated tightness with limited knee extension at rest this was explained to pt. Pt agreeable to slight manual mobilization by PT to attempt to improve positioning, with 5 mins spent on slight movements in knee extension and adjusts in height of leg rest to improve knee extension, pt able to achieve improved positioning with only slight knee flexion noted. PT also educated on quad sets, resting position to avoid pillows under knee, and if sleeping on Lt side (position of pt when PT entered room) to have a pillow between knees to prevent Rt hip adduction for improved position. Pt also agreeable to placement of amputee board at RLE for improved positioning, pt requested towel placement for comfort at residual limb site, pt tolerated this for 25 mins of therapy however with several VC for attempting to remain in increased knee extension. Pt directed in pipe matching activity with reaching outside BOS for parts to achieve matching of picture with SBA for improved activity tolerance in OOB position. Pt then directed in [place] mobility back to room, SBA. Pt returned to bed, CGA with lateral scooting and SBA for sit>supine. Pt left in bed, All needs in reach and in good condition. Call light in hand.  And alarm set.   Therapy Documentation Precautions:  Precautions Precautions: Fall Precaution Comments: R BKA Restrictions Weight Bearing Restrictions: Yes RLE Weight Bearing: Non weight bearing General:   Vital Signs:  Pain: Pain Assessment  Pain Scale: 0-10 Pain Score: 0-No pain Pain Type: Surgical pain Pain Location: Leg Pain Orientation: Right Pain Descriptors / Indicators: Aching Pain Frequency: Intermittent Pain Onset: On-going Pain Intervention(s): Medication (See eMAR) Mobility:   Locomotion :    Trunk/Postural Assessment :    Balance:   Exercises:   Other Treatments:      Therapy/Group: Individual Therapy  Junie Panning 03/18/2021, 10:52 AM

## 2021-03-18 NOTE — Progress Notes (Signed)
[facility] Progress Note   Subjective:   No issues.  Working with OT this AM.  No complaints.    Objective Vitals:   03/17/21 0322 03/17/21 1535 03/17/21 2019 03/18/21 0458  BP:  (!) 104/59 100/64 (!) 102/55  Pulse:  84 87 88  Resp:  14 20 20   Temp:  98.6 F (37 C) 98.3 F (36.8 C) 98.7 F (37.1 C)  TempSrc:  Oral Oral   SpO2:  99% 97% 99%  Weight: 75.5 kg     Height:       Physical Exam  General: Well developed male, alert and in NAD Heart: RRR, no mumurs, rubs or gallops Lungs: CTA bilaterally without wheezing, rhonchi or rales Abdomen: Soft, non-tender, non-distended, +BS Extremities: No edema b/l lower extremities, R stump wrapped Dialysis Access: RUE AVF + bruit, + buttonholes   Additional Objective Labs: Basic Metabolic Panel: Recent Labs  Lab 03/13/21 0536 03/15/21 0249 03/16/21 0239  NA 135 137 138  K 4.0 3.9 4.0  CL 98 102 102  CO2 24 27 28   GLUCOSE 136* 241* 141*  BUN 66* 18 27*  CREATININE 9.23* 3.65* 5.64*  CALCIUM 8.3* 7.7* 7.6*  PHOS 2.8 2.6  --    Liver Function Tests: No results for input(s): AST, ALT, ALKPHOS, BILITOT, PROT, ALBUMIN in the last 168 hours. No results for input(s): LIPASE, AMYLASE in the last 168 hours. CBC: Recent Labs  Lab 03/12/21 0151 03/13/21 0536 03/15/21 0249 03/16/21 0239  WBC 7.3 7.9 6.0 5.6  HGB 8.8* 8.7* 9.2* 8.5*  HCT 26.7* 27.8* 28.7* 26.8*  MCV 94.3 97.2 96.6 97.5  PLT 167 209 208 223   Blood Culture    Component Value Date/Time   SDES BLOOD LEFT HAND 03/07/2021 2046   SPECREQUEST  03/07/2021 2046    BOTTLES DRAWN AEROBIC AND ANAEROBIC Blood Culture adequate volume   CULT  03/07/2021 2046    NO GROWTH 5 DAYS Performed at [facility] [address], [place], [place] [address]    REPTSTATUS 03/13/2021 FINAL 03/07/2021 2046   CBG: Recent Labs  Lab 03/17/21 0610 03/17/21 1215 03/17/21 1704 03/17/21 2110 03/18/21 0619  GLUCAP 176* 131* 129* 132* 198*   Iron  Studies:  No results for input(s): IRON, TIBC, TRANSFERRIN, FERRITIN in the last 72 hours. @lablastinr3 @ Studies/Results: VAS [place] LOWER EXTREMITY VENOUS (DVT)  Result Date: 03/17/2021  Lower Venous DVT Study Indications: Swelling.  Risk Factors: Surgery Right BKA. Limitations: Bandages and Patient pain tolerance, patient positioning. Comparison Study: No prior studies. Performing Technologist: Oliver Hum RVT  Examination Guidelines: A complete evaluation includes B-mode imaging, spectral Doppler, color Doppler, and power Doppler as needed of all accessible portions of each vessel. Bilateral testing is considered an integral part of a complete examination. Limited examinations for reoccurring indications may be performed as noted. The reflux portion of the exam is performed with the patient in reverse Trendelenburg.  +---------+---------------+---------+-----------+----------+--------------+ RIGHT    CompressibilityPhasicitySpontaneityPropertiesThrombus Aging +---------+---------------+---------+-----------+----------+--------------+ CFV      Full           Yes      Yes                                 +---------+---------------+---------+-----------+----------+--------------+ SFJ      Full                                                        +---------+---------------+---------+-----------+----------+--------------+  FV Prox  Full                                                        +---------+---------------+---------+-----------+----------+--------------+ FV Mid   Full                                                        +---------+---------------+---------+-----------+----------+--------------+ FV DistalFull                                                        +---------+---------------+---------+-----------+----------+--------------+ PFV      Full                                                         +---------+---------------+---------+-----------+----------+--------------+ POP      Full           No       No                                  +---------+---------------+---------+-----------+----------+--------------+ PTV                                                   BKA            +---------+---------------+---------+-----------+----------+--------------+ PERO                                                  BKA            +---------+---------------+---------+-----------+----------+--------------+   +---------+---------------+---------+-----------+----------+--------------+ LEFT     CompressibilityPhasicitySpontaneityPropertiesThrombus Aging +---------+---------------+---------+-----------+----------+--------------+ CFV      Full           Yes      Yes                                 +---------+---------------+---------+-----------+----------+--------------+ SFJ      Full                                                        +---------+---------------+---------+-----------+----------+--------------+ FV Prox  Full                                                        +---------+---------------+---------+-----------+----------+--------------+  FV Mid   Full                                                        +---------+---------------+---------+-----------+----------+--------------+ FV DistalFull                                                        +---------+---------------+---------+-----------+----------+--------------+ PFV      Full                                                        +---------+---------------+---------+-----------+----------+--------------+ POP      Full           Yes      Yes                                 +---------+---------------+---------+-----------+----------+--------------+ PTV      Full                                                         +---------+---------------+---------+-----------+----------+--------------+ PERO     Full                                                        +---------+---------------+---------+-----------+----------+--------------+     Summary: RIGHT: - There is no evidence of deep vein thrombosis in the lower extremity. However, portions of this examination were limited- see technologist comments above.  - No cystic structure found in the popliteal fossa.  LEFT: - There is no evidence of deep vein thrombosis in the lower extremity.  - No cystic structure found in the popliteal fossa.  *See table(s) above for measurements and observations.    Preliminary    Medications:  . acidophilus  2 capsule Oral Daily  . aspirin  81 mg Oral Daily  . clopidogrel  75 mg Oral Daily  . [START ON 03/20/2021] darbepoetin (ARANESP) injection - DIALYSIS  60 mcg Intravenous Q Tue-HD  . feeding supplement  1 Container Oral TID BM  . gabapentin  100 mg Oral Daily  . heparin  5,000 Units Subcutaneous Q8H  . insulin aspart  0-15 Units Subcutaneous TID WC  . insulin aspart  0-5 Units Subcutaneous QHS  . insulin aspart  3 Units Subcutaneous TID WC  . insulin glargine  10 Units Subcutaneous Daily  . multivitamin  1 tablet Oral QHS  . pantoprazole  40 mg Oral Daily  . rosuvastatin  10 mg Oral q morning    Dialysis Orders:  OP HD:[facility] home [facility]. 4 HD per week, [facility] Fri. 76kg edw Hep 3000  Assessment/Plan:  1. Sepsis  D/TDiabetic foot wound- MRI +for osteo R foot. IV abx w/ improving BP's and MS. s/p Vanc/ meropenem.Status post BKA 03/14/2021. Now in CIR 2. Hypokalemia-resolved, K+ 4.0 3. ESRD - Home hemo patient. Plan 3 HD per week while here on MWF , 3.5 - 4h as needed. 4. Hypotension/ volume: BP improved. no vol excess,minimal UF goals with HD.  5. Anemia - Hgb 8.5, started onDarbepoetin 60 mcg weekly. Tsat 33% on 03/15/21.  6. Metabolic bone disease - Corrected calcium at goal. On Auryxia binders.  No VDRA on medication list from [place]. 7. Nutrition - Albumin low. Changed to renal/carb mod diet with fluid restrictions. Added prosource and renal vits. 8. DMT2-per primary 9. H/O Hep C 10. H/O COPD, tobacco use.  11. H/O PAD onclopidogrel.   Madelon Lips MD 03/18/2021, 10:42 AM  [facility] Pager: [phone]

## 2021-03-19 LAB — GLUCOSE, CAPILLARY
Glucose-Capillary: 202 mg/dL — ABNORMAL HIGH (ref 70–99)
Glucose-Capillary: 217 mg/dL — ABNORMAL HIGH (ref 70–99)
Glucose-Capillary: 151 mg/dL — ABNORMAL HIGH (ref 70–99)
Glucose-Capillary: 193 mg/dL — ABNORMAL HIGH (ref 70–99)
Glucose-Capillary: 230 mg/dL — ABNORMAL HIGH (ref 70–99)

## 2021-03-19 LAB — CBC
HCT: 24.7 % — ABNORMAL LOW (ref 39.0–52.0)
Hemoglobin: 8 g/dL — ABNORMAL LOW (ref 13.0–17.0)
MCH: 31.5 pg (ref 26.0–34.0)
MCHC: 32.4 g/dL (ref 30.0–36.0)
MCV: 97.2 fL (ref 80.0–100.0)
Platelets: 157 10*3/uL (ref 150–400)
RBC: 2.54 MIL/uL — ABNORMAL LOW (ref 4.22–5.81)
RDW: 13.7 % (ref 11.5–15.5)
WBC: 5.4 10*3/uL (ref 4.0–10.5)
nRBC: 0 % (ref 0.0–0.2)

## 2021-03-19 LAB — RENAL FUNCTION PANEL
Albumin: 2.1 g/dL — ABNORMAL LOW (ref 3.5–5.0)
Anion gap: 9 (ref 5–15)
BUN: 24 mg/dL — ABNORMAL HIGH (ref 8–23)
CO2: 26 mmol/L (ref 22–32)
Calcium: 7.7 mg/dL — ABNORMAL LOW (ref 8.9–10.3)
Chloride: 101 mmol/L (ref 98–111)
Creatinine, Ser: 7.36 mg/dL — ABNORMAL HIGH (ref 0.61–1.24)
GFR, Estimated: 8 mL/min — ABNORMAL LOW (ref >60)
Glucose, Bld: 245 mg/dL — ABNORMAL HIGH (ref 70–99)
Phosphorus: 5.9 mg/dL — ABNORMAL HIGH (ref 2.5–4.6)
Potassium: 4.1 mmol/L (ref 3.5–5.1)
Sodium: 136 mmol/L (ref 135–145)

## 2021-03-19 LAB — SURGICAL PATHOLOGY

## 2021-03-19 MED ORDER — DARBEPOETIN ALFA 60 MCG/0.3ML IJ SOSY
60.0000 ug | PREFILLED_SYRINGE | INTRAMUSCULAR | Status: DC
  Filled 2021-03-19: qty 0.3

## 2021-03-19 MED ORDER — ALTEPLASE 2 MG IJ SOLR: 2.0000 mg | Freq: Once | INTRAMUSCULAR | Status: DC | PRN

## 2021-03-19 MED ORDER — INSULIN GLARGINE 100 UNIT/ML ~~LOC~~ SOLN
11.0000 [IU] | Freq: Every day | SUBCUTANEOUS | Status: DC
  Administered 2021-03-20 – 2021-03-21 (×2): 11 [IU] via SUBCUTANEOUS
  Filled 2021-03-19 (×2): qty 0.11

## 2021-03-19 NOTE — Progress Notes (Signed)
Occupational Therapy Session Note  Patient Details  Name: Duane Hall MRN: [id number] Date of Birth: 1956/11/17  Today's Date: 03/19/2021 OT Individual Time: 2258-3462 OT Individual Time Calculation (min): 70 min    Short Term Goals: Week 1:  OT Short Term Goal 1 (Week 1): Pt will complete BSC/toilet transfer with close S. OT Short Term Goal 2 (Week 1): Pt will complete STS in prep for standing ADL with CGA. OT Short Term Goal 3 (Week 1): Pt will complete toileting with min A. OT Short Term Goal 4 (Week 1): Pt will incorporate at least 2 amputee education points to incorporate into daily routine (skin check, desensitizaiton tech, etc).  Skilled Therapeutic Interventions/Progress Updates:    Pt received supine with mild pain in his residual limb, agreeable to requesting RN to premedicate for session. RN entered for med pass and items gathered for shower. Pt completed bed mobility with use of bed rails with supervision to EOB. Discussed home set up of shower and possible AE to use. Demo of posterior shower transfer method. Pt completed squat pivot transfer to the w/c with close supervision. Pt does not like staff stabilizing w/c or touching pt despite education provided re fall risk and subsequent dehisce risk if pt falls. Pt completed w/c mobility around room with supervision overall. He transferred into shower with CGA, heavy use of grab bar. Pt completed bathing seated on TTB with set up assist. He transferred back to the w/c, requiring cueing to lock brakes of w/c. Pt completed another squat pivot to the toilet with use of bilateral grab bars. Pt again frustrated by OT needing to provide supervision for transfer. Explained purpose/indication of therapy intervention with CIR stay. Pt donned underwear with close supervision with use of grab bars. Pt completed transfer back to w/c and donned shirt seated with supervision. Min A to don pants from EOB. Re-wrapped pt's residual limb while providing  limb loss education. Pt was left EOB with all needs met, bed alarm set.   Therapy Documentation Precautions:  Precautions Precautions: Fall Precaution Comments: R BKA Restrictions Weight Bearing Restrictions: Yes RLE Weight Bearing: Non weight bearing  Therapy/Group: Individual Therapy  Curtis Sites 03/19/2021, 6:28 AM

## 2021-03-19 NOTE — Progress Notes (Signed)
Physical Therapy Session Note  Patient Details  Name: Duane Hall MRN: [id number] Date of Birth: 02-Jun-1956  Today's Date: 03/19/2021 PT Individual Time: 0900-0945 PT Individual Time Calculation (min): 45 min  and Today's Date: 03/19/2021 PT Missed Time: 15 Minutes Missed Time Reason: Other (Comment) (patient requesting to finish breakfast)  Short Term Goals: Week 1:  PT Short Term Goal 1 (Week 1): Pt will complete bed<>chair transfers with supervision PT Short Term Goal 2 (Week 1): Pt will propel himself in w/c 141ft with supervision PT Short Term Goal 3 (Week 1): Pt will ambulate 82ft with minA and LRAD PT Short Term Goal 4 (Week 1): Pt will initiate stair training  Skilled Therapeutic Interventions/Progress Updates:    Patient received sitting edge of bed, politely requesting time to finish his breakfast. PT obliging, bed alarm on, call light within reach. When PT returned 15 mins later, patient was agreeable to therapy. He reported "mild pain" in his L RL, premedicated. PT providing rest breaks, distractions and repositioning to assist with pain management. He was able to transfer to wc via squat pivot with supervision. He propelled himself to therapy gym using B UE, ModI. PT providing prolonged stretch to R hamstrings in sitting 4x30s. PT reviewed the following education with patient: pre-prosthetic training (including minimizing R knee flexion contracture, hip/glute/core strengthening, desensitization techniques). PT also reviewed phantom pain vs phantom sensation and techniques to minimize both. Patient was receptive to this education, but will likely need further education/enforcement. He was without further questions. Patient returning to room in wc, transferring back to bed via squat pivot with supervision. Supine in bed ModI. Bed alarm on, call light within reach.   Therapy Documentation Precautions:  Precautions Precautions: Fall Precaution Comments: R BKA Restrictions Weight  Bearing Restrictions: Yes RLE Weight Bearing: Non weight bearing    Therapy/Group: Individual Therapy  Karoline Caldwell, PT, DPT, CBIS  03/19/2021, 7:44 AM

## 2021-03-19 NOTE — Progress Notes (Signed)
This nurse entered pts room. Pt found in the bathroom with door shut with out staff member present. Pt educated on importance of allowing staff to assist but pt states " I can do what ever I want to do. I need privacy". Charge RN will be notified of pt being noncompliant.

## 2021-03-19 NOTE — Progress Notes (Signed)
Subjective: Sitting on side of bed no complaints, for HD today  Objective Vital signs in last 24 hours: Vitals:   03/18/21 1454 03/18/21 2017 03/19/21 0441 03/19/21 0500  BP: 117/67 130/68 (!) 136/59   Pulse: 87 80 80   Resp: 16 20 20    Temp: (!) 97.2 F (36.2 C) 98.8 F (37.1 C) 98.5 F (36.9 C)   TempSrc:  Oral Oral   SpO2: 100% 99% 95%   Weight:    75.9 kg  Height:       Weight change:   Physical Exam: General: Alert, WD, WN male NAD flat affect Heart: RRR no MRG Lungs: CTA, nonlabored breathing room air Abdomen: BS positive, soft NTND Extremities: Right BKA, no pedal edema Dialysis Access: RUA AV F+ bruit noted buttonholes OP HD:[facility] home therapies. 4 HD per week, MTuTh Fri. 76kg edw Hep 3000  Problem/Plan:  1. Sepsis D/TDiabetic foot wound- MRI + osteo R foot.  Status post R  BKA 03/14/2021,IV abx w/ improving BP's Now in CIR 2. Hypokalemia-resolved, last K+ 4.0, follow-up labs predialysis 3. ESRD-OP Home hemo patient. Plan 3 HD per week while hereon MWF, 3.5 - 4h as needed. 4. Hypotension/ volume:BP improved.no vol excess,minimal UF goals with HD.  5. Anemia-Hgb 8.5, started onDarbepoetin 60 mcg weekly.Tsat 33% on 03/15/21. 6. Metabolic bone disease- Corrected calcium at goal.On Auryxia binders. No VDRA on medication list from [place]. 7. Nutrition - Albumin low 2.8. Changed to renal/carb mod diet with fluid restrictions. Addedprosource and renal vits. 8. DMT2-per primary 9. H/O Hep C 10. H/O COPD, tobacco use.  11. H/O PAD onclopidogrel.  Ernest Haber, PA-C [facility] Beeper [phone] 03/19/2021,1:03 PM  LOS: 3 days   Labs: Basic Metabolic Panel: Recent Labs  Lab 03/13/21 0536 03/15/21 0249 03/16/21 0239  NA 135 137 138  K 4.0 3.9 4.0  CL 98 102 102  CO2 24 27 28   GLUCOSE 136* 241* 141*  BUN 66* 18 27*  CREATININE 9.23* 3.65* 5.64*  CALCIUM 8.3* 7.7* 7.6*  PHOS 2.8 2.6  --    Liver Function Tests: No  results for input(s): AST, ALT, ALKPHOS, BILITOT, PROT, ALBUMIN in the last 168 hours. No results for input(s): LIPASE, AMYLASE in the last 168 hours. No results for input(s): AMMONIA in the last 168 hours. CBC: Recent Labs  Lab 03/13/21 0536 03/15/21 0249 03/16/21 0239  WBC 7.9 6.0 5.6  HGB 8.7* 9.2* 8.5*  HCT 27.8* 28.7* 26.8*  MCV 97.2 96.6 97.5  PLT 209 208 223   Cardiac Enzymes: No results for input(s): CKTOTAL, CKMB, CKMBINDEX, TROPONINI in the last 168 hours. CBG: Recent Labs  Lab 03/18/21 1214 03/18/21 1708 03/18/21 2116 03/19/21 0623 03/19/21 1210  GLUCAP 184* 92 123* 202* 217*    Studies/Results: No results found. Medications: . sodium chloride    . sodium chloride     . acidophilus  2 capsule Oral Daily  . aspirin  81 mg Oral Daily  . Chlorhexidine Gluconate Cloth  6 each Topical Q0600  . clopidogrel  75 mg Oral Daily  . [START ON 03/21/2021] darbepoetin (ARANESP) injection - DIALYSIS  60 mcg Intravenous Q Wed-HD  . feeding supplement  1 Container Oral TID BM  . gabapentin  100 mg Oral Daily  . heparin  3,000 Units Dialysis Once in dialysis  . heparin  5,000 Units Subcutaneous Q8H  . insulin aspart  0-15 Units Subcutaneous TID WC  . insulin aspart  0-5 Units Subcutaneous QHS  . insulin  aspart  3 Units Subcutaneous TID WC  . [START ON 03/20/2021] insulin glargine  11 Units Subcutaneous Daily  . multivitamin  1 tablet Oral QHS  . pantoprazole  40 mg Oral Daily  . rosuvastatin  10 mg Oral q morning

## 2021-03-19 NOTE — Progress Notes (Signed)
Pt requested to use the bathroom. Deatra Canter, NT assisted pt to the [facility] and the toilet. Pt refused to use the bathroom until staff left and shut the door. Pt states " I do not care what the rules and protocol, call the police, call who you want, this is my space and I am not using it until yall leave, so shut the door and get out". Pt has new R BKA and high fall risk per unit protocol, and verbalized understanding the risk of being left alone in the bathroom. Charge Psychologist, occupational notified.

## 2021-03-19 NOTE — Progress Notes (Signed)
Inpatient Rehabilitation Care Coordinator Assessment and Plan Patient Details  Name: Duane Hall MRN: [id number] Date of Birth: [birth year]  Today's Date: 03/19/2021  Hospital Problems: Principal Problem:   Gangrene of right foot [facility]) Active Problems:   Diabetes mellitus type 2 in nonobese ([place])   ESRD on hemodialysis ([place])   Right BKA infection ([place])   S/P BKA (below knee amputation) unilateral, right [facility])  Past Medical History:  Past Medical History:  Diagnosis Date  . Asthma   . Cancer ([place])   . Cataract   . CKD (chronic kidney disease), stage III ([place])   . Diabetes mellitus   . Glaucoma   . Vascular insufficiency 05/2020   Past Surgical History:  Past Surgical History:  Procedure Laterality Date  . ABDOMINAL AORTOGRAM W/LOWER EXTREMITY N/A 06/19/2020   Procedure: ABDOMINAL AORTOGRAM W/LOWER EXTREMITY;  Surgeon: Waynetta Sandy, MD;  Location: [place] CV LAB;  Service: Cardiovascular;  Laterality: N/A;  . ABDOMINAL AORTOGRAM W/LOWER EXTREMITY Left 10/16/2020   Procedure: ABDOMINAL AORTOGRAM W/LOWER EXTREMITY;  Surgeon: Waynetta Sandy, MD;  Location: [place] CV LAB;  Service: Cardiovascular;  Laterality: Left;  . ABDOMINAL AORTOGRAM W/LOWER EXTREMITY N/A 12/25/2020   Procedure: ABDOMINAL AORTOGRAM W/LOWER EXTREMITY;  Surgeon: Waynetta Sandy, MD;  Location: [place] CV LAB;  Service: Cardiovascular;  Laterality: N/A;  . AMPUTATION Right 01/12/2021   Procedure: 1ST AMPUTATION RAY;  Surgeon: [name], DPM;  Location: [place];  Service: Podiatry;  Laterality: Right;  . AMPUTATION Right 03/13/2021   Procedure: RIGHT BELOW KNEE AMPUTATION;  Surgeon: Waynetta Sandy, MD;  Location: [place];  Service: Vascular;  Laterality: Right;  . AV FISTULA PLACEMENT Right 09/14/2019   Procedure: Right Arm Basilic Vein transposition;  Surgeon: Angelia Mould, MD;  Location: [place];  Service: Vascular;  Laterality: Right;  . BONE  BIOPSY Left 07/29/2020   Procedure: BONE BIOPSY;  Surgeon: [name], DPM;  Location: [place];  Service: Podiatry;  Laterality: Left;  Need bone trephines and/or large bore Giamshidi  . COLONOSCOPY    . PERIPHERAL VASCULAR ATHERECTOMY Left 06/19/2020   Procedure: PERIPHERAL VASCULAR ATHERECTOMY;  Surgeon: Waynetta Sandy, MD;  Location: [place] CV LAB;  Service: Cardiovascular;  Laterality: Left;  SFA  . PERIPHERAL VASCULAR ATHERECTOMY  12/25/2020   Procedure: PERIPHERAL VASCULAR ATHERECTOMY;  Surgeon: Waynetta Sandy, MD;  Location: [place] CV LAB;  Service: Cardiovascular;;  Lt. PT - Laser Lt. SFA - Laser  . PERIPHERAL VASCULAR BALLOON ANGIOPLASTY  12/25/2020   Procedure: PERIPHERAL VASCULAR BALLOON ANGIOPLASTY;  Surgeon: Waynetta Sandy, MD;  Location: [place] CV LAB;  Service: Cardiovascular;;  Lt. SFA and PT  . UPPER GASTROINTESTINAL ENDOSCOPY  02/2019   Dr Benson [place]     Social History:  reports that he has been smoking cigarettes. He has been smoking about 0.30 packs per day. He has never used smokeless tobacco. He reports that he does not drink alcohol and does not use drugs.  Family / Support Systems Marital Status: Married How Long?: 39 years Patient Roles: Spouse,Parent Spouse/Significant Other: Barnetta Chapel (wife): [phone] Children: 3 adult sons. Other Supports: None reported Anticipated Caregiver: Wife Ability/Limitations of Caregiver: Wife works from home Caregiver Availability: 24/7 Family Dynamics: Pt lives with his wife.  Social History Preferred language: English Religion: Non-Denominational Cultural Background: Pt worked as an Engineer, manufacturing systems for Social research officer, government: some college Read: Yes Write: Yes Employment Status: Disabled Date Retired/Disabled/Unemployed: 2018 Public relations account executive Issues: Denies Guardian/Conservator:  N/A   Abuse/Neglect Abuse/Neglect Assessment Can Be Completed: Yes Physical  Abuse: Denies Verbal Abuse: Denies Sexual Abuse: Denies Exploitation of patient/patient's resources: Denies Self-Neglect: Denies  Emotional Status Pt's affect, behavior and adjustment status: Despite flat affect, pt stated he was doing well. Pt could benefit from speaking with neuropscyh. Recent Psychosocial Issues: Denies Psychiatric History: Denies Substance Abuse History: Pt admits to smoking cigarettes sinc age of 65 y/o. He states that is smokes 3-5 cigarettes per day and also vapes nicotine.  Patient / Family Perceptions, Expectations & Goals Pt/Family understanding of illness & functional limitations: Pt and family have a general understanding of care needs Premorbid pt/family roles/activities: Independent Anticipated changes in roles/activities/participation: Assistance with ADLs/IADLs Pt/family expectations/goals: Pt goal is "getting out of here."  [employer]: None Premorbid Home Care/DME Agencies: None Transportation available at discharge: Wife Resource referrals recommended: Neuropsychology  Discharge Planning Living Arrangements: Spouse/significant other Support Systems: Spouse/significant other Type of Residence: Private residence Administrator, sports: Multimedia programmer (specify) Nurse, mental health) Financial Resources: Therapist, art Screen Referred: No Living Expenses: Medical laboratory scientific officer Management: Spouse Does the patient have any problems obtaining your medications?: No Home Management: Pt reports that his wife manages all homecare needs/bills Patient/Family Preliminary Plans: No changes Care Coordinator Barriers to Discharge: Decreased caregiver support,Lack of/limited family support Care Coordinator Anticipated Follow Up Needs: HH/OP Expected length of stay: 12-14 days  Clinical Impression SW met with pt in room to introduce self, explain role, and discuss discharge process. Pt is not a English as a second language teacher. No HCPOA. No DME.    1350-SW spoke with pt wife Barnetta Chapel [phone]) to introduce self, explain role, and discuss discharge process. Pt wife reports that pt has a w/c. Sw to follow-up after team conference.  Rana Snare 03/19/2021, 10:23 PM

## 2021-03-19 NOTE — Progress Notes (Signed)
PROGRESS NOTE   Subjective/Complaints: Fatigued Eyes closed No complaints Says IV has not been used for a few days  ROS: Patient denies fever, rash, sore throat, blurred vision, nausea, vomiting, diarrhea, cough, shortness of breath or chest pain,   headache, or mood change.     Objective:   No results found. No results for input(s): WBC, HGB, HCT, PLT in the last 72 hours. No results for input(s): NA, K, CL, CO2, GLUCOSE, BUN, CREATININE, CALCIUM in the last 72 hours.  Intake/Output Summary (Last 24 hours) at 03/19/2021 1132 Last data filed at 03/19/2021 0900 Gross per 24 hour  Intake 240 ml  Output -  Net 240 ml     Pressure Injury 06/19/20 Heel Left Unstageable - Full thickness tissue loss in which the base of the injury is covered by slough (yellow, tan, gray, green or brown) and/or eschar (tan, brown or black) in the wound bed. eschar  (Active)  06/19/20 2010  Location: Heel  Location Orientation: Left  Staging: Unstageable - Full thickness tissue loss in which the base of the injury is covered by slough (yellow, tan, gray, green or brown) and/or eschar (tan, brown or black) in the wound bed.  Wound Description (Comments): eschar   Present on Admission: Yes     Pressure Injury 06/19/20 Toe (Comment  which one) Bilateral;Right;Left Stage 2 -  Partial thickness loss of dermis presenting as a shallow open injury with a red, pink wound bed without slough. scab formed on both toes (Active)  06/19/20 2010  Location: Toe (Comment  which one) (great/big toe)  Location Orientation: Bilateral;Right;Left  Staging: Stage 2 -  Partial thickness loss of dermis presenting as a shallow open injury with a red, pink wound bed without slough.  Wound Description (Comments): scab formed on both toes  Present on Admission: Yes     Pressure Injury 06/19/20 Heel Right Stage 2 -  Partial thickness loss of dermis presenting as a shallow open  injury with a red, pink wound bed without slough. skin tear (Active)  06/19/20 2010  Location: Heel  Location Orientation: Right  Staging: Stage 2 -  Partial thickness loss of dermis presenting as a shallow open injury with a red, pink wound bed without slough.  Wound Description (Comments): skin tear  Present on Admission: Yes    Physical Exam: Vital Signs Blood pressure (!) 136/59, pulse 80, temperature 98.5 F (36.9 C), temperature source Oral, resp. rate 20, height 5\' 9"  (1.753 m), weight 75.9 kg, SpO2 95 %. Gen: no distress, normal appearing HEENT: oral mucosa pink and moist, NCAT Cardio: Reg rate Chest: normal effort, normal rate of breathing Abd: soft, non-distended Ext: no edema Psych: pleasant, normal affect Skin: right BK with staples, cdi. Left heel with old healed/ing wound--stable appearing Neuro: Pt is cognitively appropriate with normal insight, memory, and awareness. Cranial nerves 2-12 are intact. Sensory exam is normal. Reflexes are 2+ in all 4's. Fine motor coordination is intact. No tremors. Motor function is grossly 5/5 UE and 3/5 RLE 4/5 LLE. Musculoskeletal: Right hamstrings tight. Unable to fully extend right knee.   Assessment/Plan: 1. Functional deficits which require 3+ hours per day of interdisciplinary therapy  in a comprehensive inpatient rehab setting.  Physiatrist is providing close team supervision and 24 hour management of active medical problems listed below.  Physiatrist and rehab team continue to assess barriers to discharge/monitor patient progress toward functional and medical goals  Care Tool:  Bathing    Body parts bathed by patient: Right arm,Left arm,Chest,Abdomen,Right upper leg,Left upper leg,Left lower leg,Front perineal area,Buttocks   Body parts bathed by helper:  (declined pericare) Body parts n/a: Right lower leg   Bathing assist Assist Level: Set up assist (seated, shower level)     Upper Body Dressing/Undressing Upper body  dressing   What is the patient wearing?: Pull over shirt    Upper body assist Assist Level: Set up assist    Lower Body Dressing/Undressing Lower body dressing      What is the patient wearing?: Pants     Lower body assist Assist for lower body dressing: Moderate Assistance - Patient 50 - 74%     Toileting Toileting    Toileting assist Assist for toileting: Minimal Assistance - Patient > 75%     Transfers Chair/bed transfer  Transfers assist     Chair/bed transfer assist level: Supervision/Verbal cueing     Locomotion Ambulation   Ambulation assist   Ambulation activity did not occur: Safety/medical concerns          Walk 10 feet activity   Assist  Walk 10 feet activity did not occur: Safety/medical concerns        Walk 50 feet activity   Assist Walk 50 feet with 2 turns activity did not occur: Safety/medical concerns         Walk 150 feet activity   Assist Walk 150 feet activity did not occur: Safety/medical concerns         Walk 10 feet on uneven surface  activity   Assist Walk 10 feet on uneven surfaces activity did not occur: Safety/medical concerns         Wheelchair     Assist Will patient use wheelchair at discharge?: Yes Type of Wheelchair: Manual    Wheelchair assist level: Independent Max wheelchair distance: 150    Wheelchair 50 feet with 2 turns activity    Assist        Assist Level: Independent   Wheelchair 150 feet activity     Assist  Wheelchair 150 feet activity did not occur: Safety/medical concerns (fatigue)   Assist Level: Independent   Blood pressure (!) 136/59, pulse 80, temperature 98.5 F (36.9 C), temperature source Oral, resp. rate 20, height 5\' 9"  (1.753 m), weight 75.9 kg, SpO2 95 %.  Medical Problem List and Plan: 1.Functional and mobility deficitssecondary to gangrene of the right foot leading to a right below-knee amputation -patient may showerif the  right below-knee amputation site is covered -ELOS/Goals: Supervision to modified independent goals for PT and OT with length of stay 7 to 10 days  --Continue CIR therapies including PT, OT   -needs knee extension board for w/c 2. Antithrombotics: -DVT/anticoagulation:Pharmaceutical:Continue Heparin5000 units every 8 hours -antiplatelet therapy: 81 mg of aspirin daily,Plavix 75 mg daily 3. Pain Management:Tylenol for mild pain -Oxycodone for moderate to severe pain--using sparingly -Robaxin as needed for spasms -Gabapentin 100 mg daily for phantom limb pain - limb massage and desensitization activities as well as visual therapy have been discussed 4. Mood:Team to provide ego support as needed -antipsychotic agents: N/AA -He does appear at risk for depression. 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 Struggling  with his role currently  -would benefit from neuropsych eval 5. Neuropsych: This patientiscapable of making decisions on hisown behalf. 6. Skin/Wound Care:  dry dressing with Ace wrap to right below knee amputation stump daily  -mayFloat left heel to prevent breakdown 7. Fluids/Electrolytes/Nutrition:Encourage adequate p.o. We discussed today the importance of his nutrition as a reflects upon his strength and wound healing. -Patient with low albumin: Continue daily supplements 8.End-stage renal disease on hemodialysis. -Patient on a Monday Wednesday Friday schedule at this point -Dialysis to be scheduled after therapy to maximize therapy participation and tolerance 9.Hypotension: Observe blood pressures while on the unit and especially after hemodialysis days  -bp holding today 3/20 10.Acute on chronic anemia: Patient on Aranesp weekly. Hgb 8.5 on 3/18, continueto monitor serial hemoglobins with dialysis.   11.Type 2 diabetes.Borderline control at present -Continue NovoLog 3 units with meals as well as Lantus 10 units daily -CBGs before meals and at bedtime with sliding scale insulin -3/21 CBGs 92-202: increase Lantus to 11U    LOS: 3 days A FACE TO FACE EVALUATION WAS PERFORMED  Martha Clan P Raulkar 03/19/2021, 11:32 AM

## 2021-03-19 NOTE — Progress Notes (Signed)
Physical Therapy Session Note  Patient Details  Name: Duane Hall MRN: [id number] Date of Birth: 11-01-1956  Today's Date: 03/19/2021 PT Individual Time: 1102-1207 PT Individual Time Calculation (min): 65 min   Short Term Goals: Week 1:  PT Short Term Goal 1 (Week 1): Pt will complete bed<>chair transfers with supervision PT Short Term Goal 2 (Week 1): Pt will propel himself in w/c 121ft with supervision PT Short Term Goal 3 (Week 1): Pt will ambulate 11ft with minA and LRAD PT Short Term Goal 4 (Week 1): Pt will initiate stair training  Skilled Therapeutic Interventions/Progress Updates:     Pt received supine in bed and agrees to therapy. No complaint of pain. Pt lying supine with eyes closed but not asleep. Denies feeling depressed. Supine to sit with bed features and supervision with cues on positioning. Pt performs squat pivot transfer to [facility] with cues for positioning and body mechanics. Pt self propels WC x150' with cues for more efficient hand placement and propulsion technique. Pt performs sit to stand in parallel bars with CGA, then performs x5 heel raises and x5 standing marches with L lower extremity. PT provides cues to increase hip extension and trunk extension to improve balance.  Much of session focused on problem solving challenge of home stair setup. PT educates pt on different techniques for navigating stairs and demonstrates as well. Pt attempts holding onto 1 rail with both hands but cannot complete any steps with this technique, saying he is not strong enough to do so. Pt then performs floor transfer to prepare for potential need to use seated scoot technique to ascend stairs. Floor transfer performed with CGA/minA and cues for body mechanics and safety. Pt then attempts using shower seat to navigate stairs. Pt is able to transfer from [facility] to shower chair with CGA, but is unable to stand from shower chair using 1 hand rail.   Pt self propels WC back to room. Squat pivot  back to bed with supervision and cues for WC parts management. Left seated at EOB with social worker present, all needs within reach.  Therapy Documentation Precautions:  Precautions Precautions: Fall Precaution Comments: R BKA Restrictions Weight Bearing Restrictions: Yes RLE Weight Bearing: Non weight bearing    Therapy/Group: Individual Therapy  Breck Coons, PT, DPT 03/19/2021, 12:43 PM

## 2021-03-19 NOTE — Progress Notes (Signed)
Pt upset with staff because this nurse would not leave the Oxy at bedside for "when he decided to take it" Pt indicates he is in pain but wants all staff to get out of his room and not bother him until they have too. Pt  Educated on unit protocol but told this nurse to " not waste my breath". Pt left sitting in the wheelchair, no  Chair alarm on b/c pt continues to remove all safety alarms. Providers and Charge RN Collie Siad notified.

## 2021-03-19 NOTE — Progress Notes (Signed)
Pt transferred him self from toilet to [facility] with out calling for  staff assistance. Pt very agitated and upset over the rules for the unit. Pt educated on the unit policy but states "I do not care". Pt in bed at this time, call light in reach

## 2021-03-19 NOTE — Care Management (Signed)
[facility] Individual Statement of Services  Patient Name:  Duane Hall  Date:  03/19/2021  Welcome to the [facility].  Our goal is to provide you with an individualized program based on your diagnosis and situation, designed to meet your specific needs.  With this comprehensive rehabilitation program, you will be expected to participate in at least 3 hours of rehabilitation therapies Monday-Friday, with modified therapy programming on the weekends.  Your rehabilitation program will include the following services:  Physical Therapy (PT), Occupational Therapy (OT), 24 hour per day rehabilitation nursing, Therapeutic Recreaction (TR), Psychology, Neuropsychology, Care Coordinator, Rehabilitation Medicine, Nutrition Services, Pharmacy Services and Other  Weekly team conferences will be held on Tuesdays to discuss your progress.  Your Inpatient Rehabilitation Care Coordinator will talk with you frequently to get your input and to update you on team discussions.  Team conferences with you and your family in attendance may also be held.  Expected length of stay: 12-14 days    Overall anticipated outcome: Supervision  Depending on your progress and recovery, your program may change. Your Inpatient Rehabilitation Care Coordinator will coordinate services and will keep you informed of any changes. Your Inpatient Rehabilitation Care Coordinator's name and contact numbers are listed  below.  The following services may also be recommended but are not provided by the [place] [facility] will be made to provide these services after discharge if needed.  Arrangements include referral to agencies that provide these services.  Your insurance has been verified to be:  [employer]  Your primary doctor is:  Adrienne Mocha  Pertinent information will be shared with your doctor and your insurance company.  Inpatient Rehabilitation Care Coordinator:  Cathleen Corti [phone] or ([phone]  Information discussed with and copy given to patient by: Rana Snare, 03/19/2021, 10:36 AM

## 2021-03-20 ENCOUNTER — Telehealth: Payer: Self-pay | Admitting: Podiatry

## 2021-03-20 LAB — GLUCOSE, CAPILLARY
Glucose-Capillary: 185 mg/dL — ABNORMAL HIGH (ref 70–99)
Glucose-Capillary: 214 mg/dL — ABNORMAL HIGH (ref 70–99)
Glucose-Capillary: 105 mg/dL — ABNORMAL HIGH (ref 70–99)
Glucose-Capillary: 206 mg/dL — ABNORMAL HIGH (ref 70–99)

## 2021-03-20 MED ORDER — FERRIC CITRATE 1 GM 210 MG(FE) PO TABS
210.0000 mg | ORAL_TABLET | Freq: Three times a day (TID) | ORAL | Status: DC
  Administered 2021-03-21 – 2021-03-26 (×14): 210 mg via ORAL
  Filled 2021-03-20 (×19): qty 1

## 2021-03-20 NOTE — Progress Notes (Signed)
Physical Therapy Session Note  Patient Details  Name: Duane Hall MRN: [id number] Date of Birth: [birth year]  Today's Date: 03/20/2021 PT Individual Time: 0900-0930 and 1420-1529 PT Individual Time Calculation (min): 30 min and 69 min  Short Term Goals: Week 1:  PT Short Term Goal 1 (Week 1): Pt will complete bed<>chair transfers with supervision PT Short Term Goal 2 (Week 1): Pt will propel himself in w/c 149ft with supervision PT Short Term Goal 3 (Week 1): Pt will ambulate 42ft with minA and LRAD PT Short Term Goal 4 (Week 1): Pt will initiate stair training  Skilled Therapeutic Interventions/Progress Updates:     Pt received seated in [facility] and agrees to therapy. Pt self propels WC x120' to therapy gym with cues for more efficient propulsion technique. Pt performs standing NMR and strengthening for L lower extremity, standing in parallel bars. Sit to stand in parallel bars with cues on body mechanics. Pt practices stepping up on 5" step, fulling extending hips and L knee, then stepping back down backward. 3x5. PT cues to increase dorsiflexor activation. Following, pt performs standing heel raises with tactile cues and verbal cues on body mechanics. 2x5. Pt self propels WC back to room, x120'. Left seated in WC with all needs within reach.  2nd Session: Pt received supine in bed and agrees to therapy. Supine to sit mod(I) with bed features. Pt perform lateral scoot transfer to [facility] with cues on positioning and WC parts management. Much of session focused on WC propulsion and management. Pt self propels WC to elevators with Bilateral upper extremities and cues for efficient propulsion technique. Pt manages elevator with cues on backing into elevator and PT providing assistance to hold door due to increased time required. Pt propels WC outdoors over unlevel and varying surfaces, with PT providing cues on position of WC and need to account for slant of pavement. Pt propels >500' with several brief  rest breaks. Pt then practices transferring to different surfaces outdoors for community reintegration training. Pt transfers to bench with cues on importance of removing foot rests and transferring toward L side when possible. Pt then transfers to stool at table with similar verbal cues. PT provides pt with WC gloves to reduce friction on hands. Pt performs sit to stand with RW and minA, and ambulates x35' with CGA. Pt left seated in WC with all needs within reach.  Therapy Documentation Precautions:  Precautions Precautions: Fall Precaution Comments: R BKA Restrictions Weight Bearing Restrictions: Yes RLE Weight Bearing: Non weight bearing   Therapy/Group: Individual Therapy  Breck Coons 03/20/2021, 1:39 PM

## 2021-03-20 NOTE — Telephone Encounter (Signed)
Patient still in hospital and cancelled upcoming appointment. Wanted you to be aware, Please Advise

## 2021-03-20 NOTE — Progress Notes (Signed)
Occupational Therapy Session Note  Patient Details  Name: Duane Hall MRN: [id number] Date of Birth: 12-28-1956  Today's Date: 03/20/2021 OT Individual Time: 4098-1191 OT Individual Time Calculation (min): 26 min    Short Term Goals: Week 1:  OT Short Term Goal 1 (Week 1): Pt will complete BSC/toilet transfer with close S. OT Short Term Goal 2 (Week 1): Pt will complete STS in prep for standing ADL with CGA. OT Short Term Goal 3 (Week 1): Pt will complete toileting with min A. OT Short Term Goal 4 (Week 1): Pt will incorporate at least 2 amputee education points to incorporate into daily routine (skin check, desensitizaiton tech, etc).   Skilled Therapeutic Interventions/Progress Updates:    Pt greeted at time of session sitting EOB agreeable to OT session. Affect throughout session difficult to read and needed encouragement for participation. Discussion throughout session regarding DC planning and having assist from wife at home who works from home. Squat pivot bed > wheelchair Supervision as he requested no CGA. Self propel room <> gym with Supervision and performed rebounder 3x15 each round: toss, toss + overhead, toss + torso twist. Once back in room, squat pivot back to bed Supervision. Alarm on call bell in reach.    Therapy Documentation Precautions:  Precautions Precautions: Fall Precaution Comments: R BKA Restrictions Weight Bearing Restrictions: Yes RLE Weight Bearing: Non weight bearing     Therapy/Group: Individual Therapy  Viona Gilmore 03/20/2021, 7:24 AM

## 2021-03-20 NOTE — Progress Notes (Signed)
Patient ID: Duane Hall, male   DOB: 03/16/1956, 65 y.o.   MRN: [id number]  SW met with pt and called pt wife Belenda Cruise [phone]) while in room to provide updates from team conference, and d/c date 3/30. Pt in discontent about d/c date. SW explained the reason for d/c date was to allow pt to be more independent at w/c level at d/c, and to ensure the ramp is built for his safety. Wife reports she is working on ramp, and waiting for the contractors to come out to the home which should happen this weekend. SW informed pt will ask medical team if ramp is installed before 3/30, can he d/c. Fam edu scheduled for Sunday 3/27 10am-12pm.   Loralee Pacas, MSW, [place] Office: [phone] Cell: [phone] Fax: [phone]

## 2021-03-20 NOTE — Telephone Encounter (Signed)
Thank you :)

## 2021-03-20 NOTE — Progress Notes (Signed)
Received phone call from pt's wife stating the pt called her and said he could leave for dinner time and to come pick him up. He also told her to bring his cigarettes so whenever he leaves he can smoke before he comes back to his room. Nurse told patient's wife that that information was inaccurate and patient was not able to leave his room for dinner under any circumstances.

## 2021-03-20 NOTE — Progress Notes (Signed)
Subjective: Sitting in wheelchair, talking on phone, no complaints, tolerated dialysis yesterday on schedule  Objective Vital signs in last 24 hours: Vitals:   03/19/21 1800 03/19/21 1807 03/19/21 2110 03/20/21 0541  BP: 113/63 139/67 (!) 93/53 129/60  Pulse: 79 77 92 78  Resp:  18 18 17   Temp:  (!) 97.5 F (36.4 C) 98.4 F (36.9 C) 98.2 F (36.8 C)  TempSrc:  Oral    SpO2:  95% 99% 100%  Weight:  75 kg  69.4 kg  Height:       Weight change: 0.8 kg  Physical Exam: General: Alert, WD, WN male NAD flat affect Heart: RRR no MRG Lungs: CTA, nonlabored breathing room air Abdomen: BS positive, soft NTND Extremities: Right BKA, no pedal edema Dialysis Access: RUA AV F+ bruit noted buttonholes  OP HD:[facility] home therapies. 4 HD per week, MTuTh Fri. 76kg edw Hep 3000  Problem/plan  1. Sepsis D/TDiabetic foot wound- MRI + osteo R foot.  Status post R  BKA 03/14/2021,IV abx w/ improving BP's Now in CIR 2. Hypokalemia-resolved, last K+ 4.1, follow-up labs predialysis use 3K bath 3. ESRD-OP Home hemo patient. Plan 3 HD per week while hereon MWF, 3.5 - 4h as needed. 4. Hypotension/ volume:BP stable.no vol excess,minimal UF goals with HD.  5. Anemiaof ESRDHgb 8.0 <8.5, started onDarbepoetin 60 mcg weekly.Tsat 33% on 03/15/21. 6. Metabolic bone disease- Corrected calcium at goal.On Auryxia binders as outpatient continue in hospital with Phos 5.9, no VDRA on medication list from [place]. Check PTH next treatment 7. Nutrition- Albumin low 2.1. Changed to renal/carb mod diet with fluid restrictions. Addedprosource and renal vits. 8. DMT2-per primary 9. H/O Hep C 10. H/O COPD, tobacco use.  11. H/O PAD onclopidogrel  Ernest Haber, PA-C [place] [phone] 03/20/2021,11:26 AM  LOS: 4 days   Labs: Basic Metabolic Panel: Recent Labs  Lab 03/15/21 0249 03/16/21 0239 03/19/21 1439  NA 137 138 136  K 3.9 4.0 4.1  CL 102 102 101  CO2  27 28 26   GLUCOSE 241* 141* 245*  BUN 18 27* 24*  CREATININE 3.65* 5.64* 7.36*  CALCIUM 7.7* 7.6* 7.7*  PHOS 2.6  --  5.9*   Liver Function Tests: Recent Labs  Lab 03/19/21 1439  ALBUMIN 2.1*   No results for input(s): LIPASE, AMYLASE in the last 168 hours. No results for input(s): AMMONIA in the last 168 hours. CBC: Recent Labs  Lab 03/15/21 0249 03/16/21 0239 03/19/21 1439  WBC 6.0 5.6 5.4  HGB 9.2* 8.5* 8.0*  HCT 28.7* 26.8* 24.7*  MCV 96.6 97.5 97.2  PLT 208 223 157   Cardiac Enzymes: No results for input(s): CKTOTAL, CKMB, CKMBINDEX, TROPONINI in the last 168 hours. CBG: Recent Labs  Lab 03/19/21 1210 03/19/21 1849 03/19/21 2110 03/19/21 2223 03/20/21 0542  GLUCAP 217* 151* 193* 230* 185*    Studies/Results: No results found. Medications: . sodium chloride    . sodium chloride     . acidophilus  2 capsule Oral Daily  . aspirin  81 mg Oral Daily  . Chlorhexidine Gluconate Cloth  6 each Topical Q0600  . clopidogrel  75 mg Oral Daily  . [START ON 03/21/2021] darbepoetin (ARANESP) injection - DIALYSIS  60 mcg Intravenous Q Wed-HD  . feeding supplement  1 Container Oral TID BM  . gabapentin  100 mg Oral Daily  . heparin  3,000 Units Dialysis Once in dialysis  . heparin  5,000 Units Subcutaneous Q8H  . insulin aspart  0-15 Units Subcutaneous TID WC  . insulin aspart  0-5 Units Subcutaneous QHS  . insulin aspart  3 Units Subcutaneous TID WC  . insulin glargine  11 Units Subcutaneous Daily  . multivitamin  1 tablet Oral QHS  . pantoprazole  40 mg Oral Daily  . rosuvastatin  10 mg Oral q morning

## 2021-03-20 NOTE — Progress Notes (Signed)
Pt notified staff that he was transferring back to his bed. This nurse entered the room, pt already rolling out of the bathroom. Pt turned off the bed alarm and transferred self to the toilet with out staff assistance. After pt was back in the bed, this nurse tried to place bedside table and wheelchair close to the bed. Pt stated "oh no no no, do not touch. My stuff is fine".  Pt was left with Bed alarm  on, call light in reach.

## 2021-03-20 NOTE — Patient Care Conference (Signed)
Inpatient RehabilitationTeam Conference and Plan of Care Update Date: 03/20/2021   Time: 10:46 AM    Patient Name: Duane Hall [facility]      Medical Record Number: [id number]  Date of Birth: 1956-06-14 Sex: Male         Room/Bed: 4M07C/4M07C-01 Payor Info: Payor: [place] / Plan: [employer] COMM PPO / Product Type: *No Product type* /    Admit Date/Time:  03/16/2021  4:52 PM  Primary Diagnosis:  Gangrene of right foot [facility])  Hospital Problems: Principal Problem:   Gangrene of right foot ([place]) Active Problems:   Diabetes mellitus type 2 in nonobese ([place])   ESRD on hemodialysis ([place])   Right BKA infection ([place])   S/P BKA (below knee amputation) unilateral, right [facility])    Expected Discharge Date: Expected Discharge Date: 03/28/21  Team Members Present: Physician leading conference: Dr. Alger Simons Care Coodinator Present: Loralee Pacas, LCSWA;Stacey Creig Hines, RN, BSN, CRRN Nurse Present: Dorthula Nettles, RN PT Present: Tereasa Coop, PT OT Present: Cherylynn Ridges, OT PPS Coordinator present : Gunnar Fusi, SLP     Current Status/Progress Goal Weekly Team Focus  Bowel/Bladder             Swallow/Nutrition/ Hydration             ADL's   Min A overall, Min/mod A LB ADLs  Mod I/supervision  transfers, self-care retraining, residual limb traning, dc planning   Mobility   supervision bed mobility and transfers. minA sit to stand. unable to complete stairs.  Supervision  L lower extremity strength, DC prep, stairs   Communication             Safety/Cognition/ Behavioral Observations            Pain             Skin               Discharge Planning:  D/c to home with 24/7 care from pt wife who works from home.Pt will need to be at supervision level of care at time of discharge.   Team Discussion: Right BKA after gangrene, pain, mood, ESRD on HD. Continent B/B, 8/10 pain, given Oxy PRN. Non-compliant with safety, won't allow staff to assist with transfers. Also  depressed but most likely would not admit to it. Plan is to discharge home with wife. She works from home, and states the patient does not express himself. He does have a WC already. Patient on target to meet rehab goals: Yes, min assist with sit to stands, mod assist for ADL's. Has Mod I goals. With PT he is at a supervision level, big barrier is stairs, has 7 steps to enter home. Will continue to work on that this week. Wife is working on getting a ramp installed at the house.   *See Care Plan and progress notes for long and short-term goals.   Revisions to Treatment Plan:  Not at this time.  Teaching Needs: Family education, medication management, pain management, skin/wound care, residual limb education, HD management, depression management, transfer training, gait training, balance training, endurance training, stair training, weight bearing precautions, safety awareness.  Current Barriers to Discharge: Inaccessible home environment, Decreased caregiver support, Medical stability, Home enviroment access/layout, Wound care, Lack of/limited family support, Hemodialysis, Weight bearing restrictions, Medication compliance and Behavior  Possible Resolutions to Barriers: Continue current medications, provide emotional support.     Medical Summary Current Status: right bka after gangrene, pain, mood issues. esrd on HD. wound  looks great  Barriers to Discharge: Medical stability   Possible Resolutions to [employer] Focus: pain mgt, ego support, wound care/compression, daily assessment of pt data, labs   Continued Need for Acute Rehabilitation Level of Care: The patient requires daily medical management by a physician with specialized training in physical medicine and rehabilitation for the following reasons: Direction of a multidisciplinary physical rehabilitation program to maximize functional independence : Yes Medical management of patient stability for increased activity during  participation in an intensive rehabilitation regime.: Yes Analysis of laboratory values and/or radiology reports with any subsequent need for medication adjustment and/or medical intervention. : Yes   I attest that I was present, lead the team conference, and concur with the assessment and plan of the team.   Cristi Loron 03/20/2021, 5:12 PM

## 2021-03-20 NOTE — Progress Notes (Signed)
PROGRESS NOTE   Subjective/Complaints: No new issues. Pain still a problem at times  ROS: Patient denies fever, rash, sore throat, blurred vision, nausea, vomiting, diarrhea, cough, shortness of breath or chest pain,  Headache .      Objective:   No results found. Recent Labs    03/19/21 1439  WBC 5.4  HGB 8.0*  HCT 24.7*  PLT 157   Recent Labs    03/19/21 1439  NA 136  K 4.1  CL 101  CO2 26  GLUCOSE 245*  BUN 24*  CREATININE 7.36*  CALCIUM 7.7*    Intake/Output Summary (Last 24 hours) at 03/20/2021 8916 Last data filed at 03/20/2021 9450 Gross per 24 hour  Intake 480 ml  Output 1500 ml  Net -1020 ml     Pressure Injury 06/19/20 Heel Left Unstageable - Full thickness tissue loss in which the base of the injury is covered by slough (yellow, tan, gray, green or brown) and/or eschar (tan, brown or black) in the wound bed. eschar  (Active)  06/19/20 2010  Location: Heel  Location Orientation: Left  Staging: Unstageable - Full thickness tissue loss in which the base of the injury is covered by slough (yellow, tan, gray, green or brown) and/or eschar (tan, brown or black) in the wound bed.  Wound Description (Comments): eschar   Present on Admission: Yes     Pressure Injury 06/19/20 Toe (Comment  which one) Bilateral;Right;Left Stage 2 -  Partial thickness loss of dermis presenting as a shallow open injury with a red, pink wound bed without slough. scab formed on both toes (Active)  06/19/20 2010  Location: Toe (Comment  which one) (great/big toe)  Location Orientation: Bilateral;Right;Left  Staging: Stage 2 -  Partial thickness loss of dermis presenting as a shallow open injury with a red, pink wound bed without slough.  Wound Description (Comments): scab formed on both toes  Present on Admission: Yes     Pressure Injury 06/19/20 Heel Right Stage 2 -  Partial thickness loss of dermis presenting as a shallow  open injury with a red, pink wound bed without slough. skin tear (Active)  06/19/20 2010  Location: Heel  Location Orientation: Right  Staging: Stage 2 -  Partial thickness loss of dermis presenting as a shallow open injury with a red, pink wound bed without slough.  Wound Description (Comments): skin tear  Present on Admission: Yes    Physical Exam: Vital Signs Blood pressure 129/60, pulse 78, temperature 98.2 F (36.8 C), resp. rate 17, height 5\' 9"  (1.753 m), weight 69.4 kg, SpO2 100 %. Constitutional: No distress . Vital signs reviewed. HEENT: EOMI, oral membranes moist Neck: supple Cardiovascular: RRR without murmur. No JVD    Respiratory/Chest: CTA Bilaterally without wheezes or rales. Normal effort    GI/Abdomen: BS +, non-tender, non-distended Ext: no clubbing, cyanosis, or edema Psych: pleasant and cooperative Skin: right BK with staples, remains cdi. Left heel old healed wound with scaling skin Neuro: Pt is cognitively appropriate with normal insight, memory, and awareness. Cranial nerves 2-12 are intact. Sensory exam is normal. Reflexes are 2+ in all 4's. Fine motor coordination is intact. No tremors. Motor function is  grossly 5/5 UE and 3/5 RLE 4/5 LLE. Musculoskeletal: Right hamstrings tight. Unable to fully extend right knee.   Assessment/Plan: 1. Functional deficits which require 3+ hours per day of interdisciplinary therapy in a comprehensive inpatient rehab setting.  Physiatrist is providing close team supervision and 24 hour management of active medical problems listed below.  Physiatrist and rehab team continue to assess barriers to discharge/monitor patient progress toward functional and medical goals  Care Tool:  Bathing    Body parts bathed by patient: Right arm,Left arm,Chest,Abdomen,Right upper leg,Left upper leg,Left lower leg,Front perineal area,Buttocks   Body parts bathed by helper:  (declined pericare) Body parts n/a: Right lower leg   Bathing  assist Assist Level: Set up assist (seated, shower level)     Upper Body Dressing/Undressing Upper body dressing   What is the patient wearing?: Pull over shirt    Upper body assist Assist Level: Set up assist    Lower Body Dressing/Undressing Lower body dressing      What is the patient wearing?: Pants     Lower body assist Assist for lower body dressing: Moderate Assistance - Patient 50 - 74%     Toileting Toileting    Toileting assist Assist for toileting: Minimal Assistance - Patient > 75%     Transfers Chair/bed transfer  Transfers assist     Chair/bed transfer assist level: Supervision/Verbal cueing     Locomotion Ambulation   Ambulation assist   Ambulation activity did not occur: Safety/medical concerns          Walk 10 feet activity   Assist  Walk 10 feet activity did not occur: Safety/medical concerns        Walk 50 feet activity   Assist Walk 50 feet with 2 turns activity did not occur: Safety/medical concerns         Walk 150 feet activity   Assist Walk 150 feet activity did not occur: Safety/medical concerns         Walk 10 feet on uneven surface  activity   Assist Walk 10 feet on uneven surfaces activity did not occur: Safety/medical concerns         Wheelchair     Assist Will patient use wheelchair at discharge?: Yes Type of Wheelchair: Manual    Wheelchair assist level: Independent Max wheelchair distance: 150    Wheelchair 50 feet with 2 turns activity    Assist        Assist Level: Independent   Wheelchair 150 feet activity     Assist  Wheelchair 150 feet activity did not occur:  (fatigue)   Assist Level: Independent   Blood pressure 129/60, pulse 78, temperature 98.2 F (36.8 C), resp. rate 17, height 5\' 9"  (1.753 m), weight 69.4 kg, SpO2 100 %.  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 Antithrombotics: -DVT/anticoagulation:Pharmaceutical:Continue Heparin5000 units every 8 hours -antiplatelet therapy: 81 mg of aspirin daily,Plavix 75 mg daily 3. Pain Management:Tylenol for mild pain -Oxycodone for moderate to severe pain--using sparingly -Robaxin as needed for spasms -Gabapentin 100 mg daily for phantom limb pain - limb massage and desensitization activities as well as visual therapy have been discussed 4. Mood:Team  to provide ego support as needed -antipsychotic agents: N/AA -appears depressed, angry at times. Struggling with his role currently  -would benefit from neuropsych eval 5. Neuropsych: This patientiscapable of making decisions on hisown behalf. 6. Skin/Wound Care:  dry dressing with Ace wrap to right below knee amputation stump daily  -mayFloat left heel to prevent breakdown 7. Fluids/Electrolytes/Nutrition:Encourage adequate p.o. We discussed today the importance of his nutrition as a reflects upon his strength and wound healing. -Patient with low albumin: Continue daily supplements 8.End-stage renal disease on hemodialysis. -Patient on a Monday Wednesday Friday schedule at this point -Dialysis to be scheduled after therapy to maximize therapy participation and tolerance 9.Hypotension: Observe blood pressures while on the unit and especially after hemodialysis days  -bp holding today 3/22 10.Acute on chronic anemia: Patient on Aranesp weekly. Hgb 8.5 on 3/18, continueto monitor serial hemoglobins with dialysis.  11.Type  2 diabetes.Borderline control at present -Continue NovoLog 3 units with meals as well as Lantus 10 units daily -CBGs before meals and at bedtime with sliding scale insulin -3/22 CBGs   Lantus increased to 11U yesterday--observe today    LOS: 4 days A FACE TO FACE EVALUATION WAS PERFORMED  Meredith Staggers 03/20/2021, 9:25 AM

## 2021-03-20 NOTE — Progress Notes (Signed)
Patient refusing all safety measures- alarms, belts, chair pads. Patient states he will not transfer by himself and will call if he needs to get out of the wheelchair. Patient agitated when nurse attempted to educate on importance of using safety measures and risk of fall. Per previous notes this is not new behavior.

## 2021-03-20 NOTE — Progress Notes (Signed)
Occupational Therapy Session Note  Patient Details  Name: Duane Hall MRN: [id number] Date of Birth: [birth year]  Today's Date: 03/20/2021 OT Individual Time: 1003-1030 OT Individual Time Calculation (min): 27 min   Short Term Goals: Week 1:  OT Short Term Goal 1 (Week 1): Pt will complete BSC/toilet transfer with close S. OT Short Term Goal 2 (Week 1): Pt will complete STS in prep for standing ADL with CGA. OT Short Term Goal 3 (Week 1): Pt will complete toileting with min A. OT Short Term Goal 4 (Week 1): Pt will incorporate at least 2 amputee education points to incorporate into daily routine (skin check, desensitizaiton tech, etc).  Skilled Therapeutic Interventions/Progress Updates:    Pt greeted seated in wc and agreeable to OT treatment session. Pt declined bathing/dressing at this time 2/2 humidity in his room. Pt agreeable to UB there-ex. Pt completed 5 mins x2 on [employer] arm bike on level 3 forward, then backwards. Discussed PLOF, DME needs, and goals for therapy. Pt returned to room and left seated in wc with needs met. OT encouraged pt to also keep R knee straight as he had knee flexed.   Therapy Documentation Precautions:  Precautions Precautions: Fall Precaution Comments: R BKA Restrictions Weight Bearing Restrictions: Yes RLE Weight Bearing: Non weight bearing Pain: Pain Assessment Pain Scale: 0-10 Pain Score: 4  Pain Type: Surgical pain Pain Location: Leg Pain Orientation: Right Pain Descriptors / Indicators: Aching Pain Frequency: Constant Pain Onset: On-going Patients Stated Pain Goal: 3 Pain Intervention(s):Repositioned   Therapy/Group: Individual Therapy  Valma Cava 03/20/2021, 10:18 AM

## 2021-03-20 NOTE — Telephone Encounter (Signed)
NP

## 2021-03-21 LAB — CBC
HCT: 25.8 % — ABNORMAL LOW (ref 39.0–52.0)
Hemoglobin: 8.2 g/dL — ABNORMAL LOW (ref 13.0–17.0)
MCH: 31.2 pg (ref 26.0–34.0)
MCHC: 31.8 g/dL (ref 30.0–36.0)
MCV: 98.1 fL (ref 80.0–100.0)
Platelets: 147 10*3/uL — ABNORMAL LOW (ref 150–400)
RBC: 2.63 MIL/uL — ABNORMAL LOW (ref 4.22–5.81)
RDW: 13.7 % (ref 11.5–15.5)
WBC: 5.1 10*3/uL (ref 4.0–10.5)
nRBC: 0 % (ref 0.0–0.2)

## 2021-03-21 LAB — GLUCOSE, CAPILLARY
Glucose-Capillary: 235 mg/dL — ABNORMAL HIGH (ref 70–99)
Glucose-Capillary: 160 mg/dL — ABNORMAL HIGH (ref 70–99)
Glucose-Capillary: 171 mg/dL — ABNORMAL HIGH (ref 70–99)
Glucose-Capillary: 184 mg/dL — ABNORMAL HIGH (ref 70–99)

## 2021-03-21 LAB — RENAL FUNCTION PANEL
Albumin: 2.4 g/dL — ABNORMAL LOW (ref 3.5–5.0)
Anion gap: 9 (ref 5–15)
BUN: 16 mg/dL (ref 8–23)
CO2: 27 mmol/L (ref 22–32)
Calcium: 7.9 mg/dL — ABNORMAL LOW (ref 8.9–10.3)
Chloride: 102 mmol/L (ref 98–111)
Creatinine, Ser: 6.69 mg/dL — ABNORMAL HIGH (ref 0.61–1.24)
GFR, Estimated: 9 mL/min — ABNORMAL LOW (ref >60)
Glucose, Bld: 170 mg/dL — ABNORMAL HIGH (ref 70–99)
Phosphorus: 5.4 mg/dL — ABNORMAL HIGH (ref 2.5–4.6)
Potassium: 3.7 mmol/L (ref 3.5–5.1)
Sodium: 138 mmol/L (ref 135–145)

## 2021-03-21 MED ORDER — INSULIN ASPART 100 UNIT/ML ~~LOC~~ SOLN
4.0000 [IU] | Freq: Three times a day (TID) | SUBCUTANEOUS | Status: DC
  Administered 2021-03-22 (×2): 4 [IU] via SUBCUTANEOUS

## 2021-03-21 MED ORDER — INSULIN GLARGINE 100 UNIT/ML ~~LOC~~ SOLN
15.0000 [IU] | Freq: Every day | SUBCUTANEOUS | Status: DC
  Administered 2021-03-22 – 2021-03-23 (×2): 15 [IU] via SUBCUTANEOUS
  Filled 2021-03-21 (×6): qty 0.15

## 2021-03-21 MED ORDER — DARBEPOETIN ALFA 60 MCG/0.3ML IJ SOSY
PREFILLED_SYRINGE | INTRAMUSCULAR | Status: AC
  Administered 2021-03-21: 60 ug via INTRAVENOUS
  Filled 2021-03-21: qty 0.3

## 2021-03-21 NOTE — Progress Notes (Signed)
Occupational Therapy Session Note  Patient Details  Name: RC AMISON MRN: [id number] Date of Birth: [birth year]  Today's Date: 03/21/2021 OT Individual Time: 1130-1200 OT Individual Time Calculation (min): 30 min    Short Term Goals: Week 1:  OT Short Term Goal 1 (Week 1): Pt will complete BSC/toilet transfer with close S. OT Short Term Goal 2 (Week 1): Pt will complete STS in prep for standing ADL with CGA. OT Short Term Goal 3 (Week 1): Pt will complete toileting with min A. OT Short Term Goal 4 (Week 1): Pt will incorporate at least 2 amputee education points to incorporate into daily routine (skin check, desensitizaiton tech, etc).  Skilled Therapeutic Interventions/Progress Updates:    Pt received in gym from previous OT session. Mod I squat pivot transfers from w/c > mat > w/c. Pt transitioned into prone and was edu on benefits of positioning for future prosthetic prep. Pt still unable to achieve full knee extension but with several minutes in prone he was able achieve several more degrees of extension. Pt completed isolated hip extension exercises bilaterally to increase neutral hip alignment and posterior chain strengthening needed to increase ability to stand more upright during ADLs. Pt was also able to complete "supermans", lifting his chest and BUE off the mat. Pt returned to his w/c and then room. He transferred to EOB and was left eating lunch, bed alarm set.   Therapy Documentation Precautions:  Precautions Precautions: Fall Precaution Comments: R BKA Restrictions Weight Bearing Restrictions: Yes RLE Weight Bearing: Non weight bearing   Therapy/Group: Individual Therapy  Curtis Sites 03/21/2021, 8:27 AM

## 2021-03-21 NOTE — Progress Notes (Signed)
Occupational Therapy Session Note  Patient Details  Name: Duane Hall MRN: [id number] Date of Birth: [birth year]  Today's Date: 03/21/2021 OT Individual Time: 0805-0902 OT Individual Time Calculation (min): 57 min    Short Term Goals: Week 1:  OT Short Term Goal 1 (Week 1): Pt will complete BSC/toilet transfer with close S. OT Short Term Goal 2 (Week 1): Pt will complete STS in prep for standing ADL with CGA. OT Short Term Goal 3 (Week 1): Pt will complete toileting with min A. OT Short Term Goal 4 (Week 1): Pt will incorporate at least 2 amputee education points to incorporate into daily routine (skin check, desensitizaiton tech, etc).  Skilled Therapeutic Interventions/Progress Updates:    1:1. Pt received in w/c agreeable to OT requesting to shower. Pt completes gathering items needed for shower with S and OT applies cover to RLE. Pt requires A to position w/c for safe squat pivot transfer to/from TTBin bathroom. Pt states, "oh, you really want to get closer." OT educates on reducing fall risk by improved positioning prior to trasnfers. Pt completes bathing seated on TTB with lateral leans to doff clothing and wash buttocks. Pt dons clothing seated in w.c with set up for UB and S for LB at sit to stand level for underwear at sink with VC for proximity to sink and S for pants at bed level. OT dmeo bag trick to don teds. Pt appreciative. Pt grooms at sink with set up. Exited session with pt seated in w/c, pt refuses exit alarm on and call light in reach   Therapy Documentation Precautions:  Precautions Precautions: Fall Precaution Comments: R BKA Restrictions Weight Bearing Restrictions: Yes RLE Weight Bearing: Non weight bearing General:   Vital Signs: Therapy Vitals Temp: 98.3 F (36.8 C) Pulse Rate: 78 Resp: 17 BP: (!) 119/52 Patient Position (if appropriate): Lying Oxygen Therapy SpO2: 98 % Pain:   ADL: ADL Eating: Independent Where Assessed-Eating: Wheelchair,Bed  level Grooming: Supervision/safety Where Assessed-Grooming: Sitting at sink,Edge of bed Upper Body Bathing: Supervision/safety Where Assessed-Upper Body Bathing: Edge of bed Lower Body Bathing: Moderate assistance Where Assessed-Lower Body Bathing: Edge of bed Upper Body Dressing: Supervision/safety Where Assessed-Upper Body Dressing: Edge of bed Lower Body Dressing: Moderate assistance Where Assessed-Lower Body Dressing: Wheelchair Toileting: Moderate assistance Where Assessed-Toileting: Bedside Commode Toilet Transfer: Therapist, music Method: Engineer, water: Extra wide bedside commode,Drop arm bedside commode Social research officer, government: Minimal assistance Social research officer, government Method: Education officer, environmental: Nurse, learning disability    Praxis   Exercises:   Other Treatments:     Therapy/Group: Individual Therapy  Tonny Branch 03/21/2021, 6:53 AM

## 2021-03-21 NOTE — Progress Notes (Signed)
Subjective: Seen in physical therapy no complaints /dialysis today  Objective Vital signs in last 24 hours: Vitals:   03/21/21 1336 03/21/21 1400 03/21/21 1430 03/21/21 1500  BP: 126/67 (!) 109/58 (!) 104/55 (!) 98/54  Pulse: 81 79 79 79  Resp:      Temp:      TempSrc:      SpO2:      Weight:      Height:       Weight change:   Physical Exam: General:Alert,WD, WN male NAD, more interactive today Heart:RRR no MRG Lungs:CTA, nonlabored breathing room air Abdomen:BS positive, soft NTND Extremities:Right BKA, no pedal edema Dialysis Access:RUA AV F+ bruit noted buttonholes  OP HD:[facility]. 4 HD per week, [facility] Fri. 76kg edw Hep 3000  Problem/plan  1. Sepsis D/TDiabetic foot wound- MRI + osteo R foot.Status post RBKA 03/14/2021,IV abx w/ improving BP's Now in CIR 2. Hypokalemia-resolved,lastK+ 4.1, follow-up labs predialysis use 3K bath 3. ESRD-OPHome hemo patient. Plan 3 HD per week while hereon MWF, 3.5 - 4h as needed. 4. Hypotension/ volume:BP stable.no vol excess,minimal UF goals with HD.  5. Anemiaof ESRDHgb 8.0 <8.5, started onDarbepoetin 60 mcg weekly.Tsat 33% on 03/15/21. 6. Metabolic bone disease- Corrected calcium at goal.On Auryxia binders as outpatient continue in hospital with Phos 5.9, no VDRA on medication list from [facility]. Check PTH next treatment 7. Nutrition- Albumin low2.1. Changed to renal/carb mod diet with fluid restrictions. Addedprosource and renal vits. 8. DMT2-per primary 9. H/O Hep C 10. H/O COPD, tobacco use.  11. H/O PAD onclopidogrel  Ernest Haber, PA-C [facility] [phone] 03/21/2021,3:28 PM  LOS: 5 days   Labs: Basic Metabolic Panel: Recent Labs  Lab 03/15/21 0249 03/16/21 0239 03/19/21 1439  NA 137 138 136  K 3.9 4.0 4.1  CL 102 102 101  CO2 27 28 26   GLUCOSE 241* 141* 245*  BUN 18 27* 24*  CREATININE 3.65* 5.64* 7.36*  CALCIUM 7.7* 7.6* 7.7*  PHOS  2.6  --  5.9*   Liver Function Tests: Recent Labs  Lab 03/19/21 1439  ALBUMIN 2.1*   No results for input(s): LIPASE, AMYLASE in the last 168 hours. No results for input(s): AMMONIA in the last 168 hours. CBC: Recent Labs  Lab 03/15/21 0249 03/16/21 0239 03/19/21 1439  WBC 6.0 5.6 5.4  HGB 9.2* 8.5* 8.0*  HCT 28.7* 26.8* 24.7*  MCV 96.6 97.5 97.2  PLT 208 223 157   Cardiac Enzymes: No results for input(s): CKTOTAL, CKMB, CKMBINDEX, TROPONINI in the last 168 hours. CBG: Recent Labs  Lab 03/20/21 1143 03/20/21 1638 03/20/21 2103 03/21/21 0601 03/21/21 1206  GLUCAP 214* 105* 206* 235* 160*    Studies/Results: No results found. Medications:  sodium chloride     sodium chloride      acidophilus  2 capsule Oral Daily   aspirin  81 mg Oral Daily   Chlorhexidine Gluconate Cloth  6 each Topical Q0600   clopidogrel  75 mg Oral Daily   darbepoetin (ARANESP) injection - DIALYSIS  60 mcg Intravenous Q Wed-HD   feeding supplement  1 Container Oral TID BM   ferric citrate  210 mg Oral TID WC   gabapentin  100 mg Oral Daily   heparin  3,000 Units Dialysis Once in dialysis   heparin  5,000 Units Subcutaneous Q8H   insulin aspart  0-15 Units Subcutaneous TID WC   insulin aspart  0-5 Units Subcutaneous QHS   insulin aspart  4 Units Subcutaneous  TID WC   [START ON 03/22/2021] insulin glargine  15 Units Subcutaneous Daily   multivitamin  1 tablet Oral QHS   pantoprazole  40 mg Oral Daily   rosuvastatin  10 mg Oral q morning

## 2021-03-21 NOTE — Progress Notes (Signed)
Occupational Therapy Session Note  Patient Details  Name: Duane Hall MRN: [id number] Date of Birth: Sep 24, 1956  Today's Date: 03/21/2021 OT Individual Time: 1100-1130 OT Individual Time Calculation (min): 30 min    Short Term Goals: Week 1:  OT Short Term Goal 1 (Week 1): Pt will complete BSC/toilet transfer with close S. OT Short Term Goal 2 (Week 1): Pt will complete STS in prep for standing ADL with CGA. OT Short Term Goal 3 (Week 1): Pt will complete toileting with min A. OT Short Term Goal 4 (Week 1): Pt will incorporate at least 2 amputee education points to incorporate into daily routine (skin check, desensitizaiton tech, etc).  Skilled Therapeutic Interventions/Progress Updates:    Patient seated in w/c, ready for therapy.  He states that pain is under control at this time.  Re-wrapped residual limb after nursing provided dressing change.  Reviewed limb shaping and purpose of ace wrap.  Reviewed leg positioning and importance of knee extension.  Revised support for right lower leg.  Reviewed strategies for phantom pain and practiced use of mirror.  Provided and reviewed amputee coalition hand book.  Hand off to [facility] for ongoing OT at this time.    Therapy Documentation Precautions:  Precautions Precautions: Fall Precaution Comments: R BKA Restrictions Weight Bearing Restrictions: Yes RLE Weight Bearing: Non weight bearing  Therapy/Group: Individual Therapy  Carlos Levering 03/21/2021, 7:45 AM

## 2021-03-21 NOTE — Progress Notes (Signed)
Patient has not voided this shift but won't allow scanning or anything else.

## 2021-03-21 NOTE — Progress Notes (Signed)
PROGRESS NOTE   Subjective/Complaints: Pt refusing interventions by nursing including assessment of bladder emptying, toileting, etc. Generally irritable  ROS: Limited due to cognitive/behavioral    Objective:   No results found. Recent Labs    03/19/21 1439  WBC 5.4  HGB 8.0*  HCT 24.7*  PLT 157   Recent Labs    03/19/21 1439  NA 136  K 4.1  CL 101  CO2 26  GLUCOSE 245*  BUN 24*  CREATININE 7.36*  CALCIUM 7.7*    Intake/Output Summary (Last 24 hours) at 03/21/2021 6387 Last data filed at 03/20/2021 1700 Gross per 24 hour  Intake 220 ml  Output --  Net 220 ml     Pressure Injury 06/19/20 Heel Left Unstageable - Full thickness tissue loss in which the base of the injury is covered by slough (yellow, tan, gray, green or brown) and/or eschar (tan, brown or black) in the wound bed. eschar  (Active)  06/19/20 2010  Location: Heel  Location Orientation: Left  Staging: Unstageable - Full thickness tissue loss in which the base of the injury is covered by slough (yellow, tan, gray, green or brown) and/or eschar (tan, brown or black) in the wound bed.  Wound Description (Comments): eschar   Present on Admission: Yes     Pressure Injury 06/19/20 Toe (Comment  which one) Bilateral;Right;Left Stage 2 -  Partial thickness loss of dermis presenting as a shallow open injury with a red, pink wound bed without slough. scab formed on both toes (Active)  06/19/20 2010  Location: Toe (Comment  which one) (great/big toe)  Location Orientation: Bilateral;Right;Left  Staging: Stage 2 -  Partial thickness loss of dermis presenting as a shallow open injury with a red, pink wound bed without slough.  Wound Description (Comments): scab formed on both toes  Present on Admission: Yes     Pressure Injury 06/19/20 Heel Right Stage 2 -  Partial thickness loss of dermis presenting as a shallow open injury with a red, pink wound bed  without slough. skin tear (Active)  06/19/20 2010  Location: Heel  Location Orientation: Right  Staging: Stage 2 -  Partial thickness loss of dermis presenting as a shallow open injury with a red, pink wound bed without slough.  Wound Description (Comments): skin tear  Present on Admission: Yes    Physical Exam: Vital Signs Blood pressure (!) 119/52, pulse 78, temperature 98.3 F (36.8 C), resp. rate 17, height 5\' 9"  (1.753 m), weight 69.4 kg, SpO2 98 %. Constitutional: No distress . Vital signs reviewed. HEENT: EOMI, oral membranes moist Neck: supple Cardiovascular: RRR without murmur. No JVD    Respiratory/Chest: CTA Bilaterally without wheezes or rales. Normal effort    GI/Abdomen: BS +, non-tender, non-distended Ext: no clubbing, cyanosis, or edema Psych: pleasant and cooperative Skin: right bk dressed Neuro: Pt is cognitively appropriate with normal insight, memory, and awareness. Cranial nerves 2-12 are intact. Sensory exam is normal. Reflexes are 2+ in all 4's. Fine motor coordination is intact. No tremors. Motor function is grossly 5/5 UE and 3/5 RLE 4/5 LLE--stable. Musculoskeletal: Right hamstrings tight. Unable to fully extend right knee.   Assessment/Plan: 1. Functional deficits which  require 3+ hours per day of interdisciplinary therapy in a comprehensive inpatient rehab setting.  Physiatrist is providing close team supervision and 24 hour management of active medical problems listed below.  Physiatrist and rehab team continue to assess barriers to discharge/monitor patient progress toward functional and medical goals  Care Tool:  Bathing    Body parts bathed by patient: Right arm,Left arm,Chest,Abdomen,Right upper leg,Left upper leg,Left lower leg,Front perineal area,Buttocks   Body parts bathed by helper:  (declined pericare) Body parts n/a: Right lower leg   Bathing assist Assist Level: Set up assist (seated, shower level)     Upper Body  Dressing/Undressing Upper body dressing   What is the patient wearing?: Pull over shirt    Upper body assist Assist Level: Set up assist    Lower Body Dressing/Undressing Lower body dressing      What is the patient wearing?: Pants     Lower body assist Assist for lower body dressing: Moderate Assistance - Patient 50 - 74%     Toileting Toileting    Toileting assist Assist for toileting: Minimal Assistance - Patient > 75%     Transfers Chair/bed transfer  Transfers assist     Chair/bed transfer assist level: Supervision/Verbal cueing     Locomotion Ambulation   Ambulation assist   Ambulation activity did not occur: Safety/medical concerns  Assist level: Contact Guard/Touching assist Assistive device: Walker-rolling Max distance: 35'   Walk 10 feet activity   Assist  Walk 10 feet activity did not occur: Safety/medical concerns  Assist level: Contact Guard/Touching assist Assistive device: Walker-rolling   Walk 50 feet activity   Assist Walk 50 feet with 2 turns activity did not occur: Safety/medical concerns         Walk 150 feet activity   Assist Walk 150 feet activity did not occur: Safety/medical concerns         Walk 10 feet on uneven surface  activity   Assist Walk 10 feet on uneven surfaces activity did not occur: Safety/medical concerns         Wheelchair     Assist Will patient use wheelchair at discharge?: Yes Type of Wheelchair: Manual    Wheelchair assist level: Supervision/Verbal cueing Max wheelchair distance: 500'    Wheelchair 50 feet with 2 turns activity    Assist        Assist Level: Supervision/Verbal cueing   Wheelchair 150 feet activity     Assist  Wheelchair 150 feet activity did not occur:  (fatigue)   Assist Level: Supervision/Verbal cueing   Blood pressure (!) 119/52, pulse 78, temperature 98.3 F (36.8 C), resp. rate 17, height 5\' 9"  (1.753 m), weight 69.4 kg, SpO2 98  %.  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 Antithrombotics: -DVT/anticoagulation:Pharmaceutical:Continue Heparin5000 units every 8 hours -antiplatelet therapy: 81 mg of aspirin daily,Plavix 75 mg daily 3. Pain Management:Tylenol for mild pain -Oxycodone for moderate to severe pain--using sparingly -Robaxin as needed for spasms -Gabapentin 100 mg daily for phantom limb pain -desensitization techniques 4. Mood:Team to provide ego support as needed -antipsychotic agents: N/AA -appears depressed, angry at times. Struggling with his role currently  -would benefit from neuropsych eval 5. Neuropsych:  This patientiscapable of making decisions on hisown behalf. 6. Skin/Wound Care:  dry dressing with Ace wrap to right below knee amputation stump daily  -mayFloat left heel to prevent breakdown 7. Fluids/Electrolytes/Nutrition:Encourage adequate p.o. We discussed today the importance of his nutrition as a reflects upon his strength and wound healing. -Patient with low albumin: Continue daily supplements 8.End-stage renal disease on hemodialysis. -Patient on a Monday Wednesday Friday schedule at this point -Dialysis scheduled after therapy to maximize therapy participation and tolerance 9.Hypotension: Observe blood pressures while on the unit and especially after hemodialysis days  -bp holding today 3/22 10.Acute on chronic anemia: Patient on Aranesp weekly. Hgb 8.5 on 3/18, continueto monitor  serial hemoglobins with dialysis.  11.Type 2 diabetes.Borderline control at present -Continue NovoLog 3 units with meals as well as Lantus 10 units daily -CBGs before meals and at bedtime with sliding scale insulin -3/23 cbg's still elevated --increase lantus to 15u with 4u meal covg    LOS: 5 days A FACE TO FACE EVALUATION WAS PERFORMED  Meredith Staggers 03/21/2021, 8:14 AM

## 2021-03-21 NOTE — Progress Notes (Signed)
Patient still not allowing assistance with transfers and refuses for staff to stay in the bathroom with him. Dr. Naaman Plummer aware during rounding per tech.

## 2021-03-21 NOTE — Progress Notes (Signed)
Physical Therapy Session Note  Patient Details  Name: Duane Hall MRN: [id number] Date of Birth: 1956/02/26  Today's Date: 03/21/2021 PT Individual Time: 1006-1100 PT Individual Time Calculation (min): 54 min   Short Term Goals: Week 1:  PT Short Term Goal 1 (Week 1): Pt will complete bed<>chair transfers with supervision PT Short Term Goal 2 (Week 1): Pt will propel himself in w/c 15ft with supervision PT Short Term Goal 3 (Week 1): Pt will ambulate 86ft with minA and LRAD PT Short Term Goal 4 (Week 1): Pt will initiate stair training  Skilled Therapeutic Interventions/Progress Updates:     Pt received seated in [facility] and agrees to therapy. No report of pain. Pt self propels WC x300' to dayroom, using bilateral upper extremities. Pt demos increased shoulder extension and improved propulsion technique. Pt manages WC and approaches Nustep with verbal cues on positioning. Pt manages WC parts with supervision and transfers to Nustep via squat pivot transfer. Pt performs NUstep for 12:00 at workload of 4 with steps per minute >40. Pt completes Nustep for strengthening of bilateral upper extremities and L lowre extremity, as well endurance training. PT provides verbal cues for body mechanics and increasing work performed with L leg versus arms. x1 seated rest break required. Pt performs lateral transfer from Nustep>WC>mat table with supervision. Pt then perform supine therex for R residual limb. Pt completes 1x15 quad sets and R hip abduction, with verbal and tactile cues for correct performance, and demonstrates improved R knee extension relative to previous sessions. Pt performs supine to sit independently. Lateral transfer to [facility] with supervision and pt propels WC x300' back to room. Left seated in WC with all needs within reach.  Therapy Documentation Precautions:  Precautions Precautions: Fall Precaution Comments: R BKA Restrictions Weight Bearing Restrictions: Yes RLE Weight Bearing: Non  weight bearing    Therapy/Group: Individual Therapy  Breck Coons, PT, DPT 03/21/2021, 10:31 AM

## 2021-03-22 ENCOUNTER — Encounter: Payer: Self-pay | Admitting: Endocrinology

## 2021-03-22 LAB — GLUCOSE, CAPILLARY
Glucose-Capillary: 145 mg/dL — ABNORMAL HIGH (ref 70–99)
Glucose-Capillary: 74 mg/dL (ref 70–99)
Glucose-Capillary: 87 mg/dL (ref 70–99)
Glucose-Capillary: 53 mg/dL — ABNORMAL LOW (ref 70–99)
Glucose-Capillary: 81 mg/dL (ref 70–99)

## 2021-03-22 MED ORDER — DARBEPOETIN ALFA 100 MCG/0.5ML IJ SOSY: 100.0000 ug | PREFILLED_SYRINGE | INTRAMUSCULAR | Status: DC

## 2021-03-22 NOTE — Progress Notes (Signed)
Patient refused his heparin claims he is bruised all over his abdomen; asking if they can change his heparin to other dvt prophylaxis.

## 2021-03-22 NOTE — Plan of Care (Signed)
  Problem: RH SAFETY Goal: RH STG ADHERE TO SAFETY PRECAUTIONS W/ASSISTANCE/DEVICE Description: STG Adhere to Safety Precautions With supervision Assistance/Device. Outcome: Not Progressing; refuses bed alarm and chair alarm

## 2021-03-22 NOTE — Progress Notes (Signed)
Physical Therapy Session Note  Patient Details  Name: Duane Hall MRN: [id number] Date of Birth: May 28, 1956  Today's Date: 03/22/2021 PT Individual Time: 1105-1200 and 1430-1530 PT Individual Time Calculation (min): 55 min and 60 min  Short Term Goals: Week 1:  PT Short Term Goal 1 (Week 1): Pt will complete bed<>chair transfers with supervision PT Short Term Goal 2 (Week 1): Pt will propel himself in w/c 149ft with supervision PT Short Term Goal 3 (Week 1): Pt will ambulate 3ft with minA and LRAD PT Short Term Goal 4 (Week 1): Pt will initiate stair training  Skilled Therapeutic Interventions/Progress Updates:     1st Session: Pt received seated in [facility] and agrees to therapy. No report of pain during session. Pt verbalizes some trepidation about plans for DC and ensuring that wife attends family education. PT provides education on plan for family ed this coming Sunday and assures pt that he is not going to DC prior to this time. Pt self propels WC to gym, x150', at mod(I). Pt then transfers to mat table mod(I) with squat pivot technique. Pt performs sit to stand to RW, bracing L leg against mat table and not shifting weight anteriorly to effectively perform safe transfer. PT educates pt on body mechanics and "nose over toes" technique. Pt then performs x7 transfers. Pt ambulates x20' with RW and close supervision with cues for body mechanics and upright gaze to improve posture and balance. Pt and PT then problem solve performing stairs per home setup. Pt trials several techniques, including "boot scoot', sliding up on buttocks, using shower chair, and leaning over rail to "hop" up and down. Pt does not feel comfortable with any of above techniques, completing x2 steps with each with modA. Pt self propels WC x150' back to room independently. Left seated in WC with alarm intact and all needs within reach.  2nd Session: Pt received supine in bed and agrees to therapy. No complaint of pain. Bed  mobility independent. Lateral transfer to [facility] with setup assistance and squat pivot from WC to mat table with mod(I). Pt transitions into prone independently. PT educates pt on importance and benefits of prone positioning for soft tissue stretching of hip flexors and passive knee extension. Pt cues to engage glutes to increase hip flexor stretch. Pt rolls into supine and performs 1x15 quad sets, SLRs, hip abduction (all with L leg), and supine bridging with R leg. PT educates pt and provides handout for residual limb desensitization. Pt practices gently rubbing residual limb and performs LAQs with mirror placed between legs. Supine to sit independently and lateral transfer to [facility] with mod(I). Pt performs standing NMR for balance and strengthening. Pt stands to RW with CGA, then attempts tossing ball at trampoline and catching rebound. PT provides min/modA at hips and pt demos consistent L lateral lean, despite multiple cues to bring to neutral posture. Pt is unable to maintain balance for more than a few seconds without at least 1 upper extremity support. Pt propels WC back to room interdependently. Left with all needs within reach.  Therapy Documentation Precautions:  Precautions Precautions: Fall Precaution Comments: R BKA Restrictions Weight Bearing Restrictions: Yes RLE Weight Bearing: Non weight bearing   Therapy/Group: Individual Therapy  Breck Coons, PT, DPT 03/22/2021, 3:51 PM

## 2021-03-22 NOTE — Progress Notes (Signed)
PROGRESS NOTE   Subjective/Complaints: Pt up with OT. Seems to be in better spirits today. Apologetic regarding his behavior to staff. Just is private when it comes to his toileting, personal care. Still adjusting to things post-operatively too  ROS: Patient denies fever, rash, sore throat, blurred vision, nausea, vomiting, diarrhea, cough, shortness of breath or chest pain,  headache, or mood change.     Objective:   No results found. Recent Labs    03/19/21 1439 03/21/21 1334  WBC 5.4 5.1  HGB 8.0* 8.2*  HCT 24.7* 25.8*  PLT 157 147*   Recent Labs    03/19/21 1439 03/21/21 1334  NA 136 138  K 4.1 3.7  CL 101 102  CO2 26 27  GLUCOSE 245* 170*  BUN 24* 16  CREATININE 7.36* 6.69*  CALCIUM 7.7* 7.9*    Intake/Output Summary (Last 24 hours) at 03/22/2021 1044 Last data filed at 03/22/2021 1950 Gross per 24 hour  Intake 360 ml  Output 600 ml  Net -240 ml     Pressure Injury 06/19/20 Heel Left Unstageable - Full thickness tissue loss in which the base of the injury is covered by slough (yellow, tan, gray, green or brown) and/or eschar (tan, brown or black) in the wound bed. eschar  (Active)  06/19/20 2010  Location: Heel  Location Orientation: Left  Staging: Unstageable - Full thickness tissue loss in which the base of the injury is covered by slough (yellow, tan, gray, green or brown) and/or eschar (tan, brown or black) in the wound bed.  Wound Description (Comments): eschar   Present on Admission: Yes     Pressure Injury 06/19/20 Toe (Comment  which one) Bilateral;Right;Left Stage 2 -  Partial thickness loss of dermis presenting as a shallow open injury with a red, pink wound bed without slough. scab formed on both toes (Active)  06/19/20 2010  Location: Toe (Comment  which one) (great/big toe)  Location Orientation: Bilateral;Right;Left  Staging: Stage 2 -  Partial thickness loss of dermis presenting as a  shallow open injury with a red, pink wound bed without slough.  Wound Description (Comments): scab formed on both toes  Present on Admission: Yes     Pressure Injury 06/19/20 Heel Right Stage 2 -  Partial thickness loss of dermis presenting as a shallow open injury with a red, pink wound bed without slough. skin tear (Active)  06/19/20 2010  Location: Heel  Location Orientation: Right  Staging: Stage 2 -  Partial thickness loss of dermis presenting as a shallow open injury with a red, pink wound bed without slough.  Wound Description (Comments): skin tear  Present on Admission: Yes    Physical Exam: Vital Signs Blood pressure 116/68, pulse 88, temperature 98.5 F (36.9 C), temperature source Oral, resp. rate 16, height 5\' 9"  (1.753 m), weight 74 kg, SpO2 100 %. Constitutional: No distress . Vital signs reviewed. HEENT: EOMI, oral membranes moist Neck: supple Cardiovascular: RRR without murmur. No JVD    Respiratory/Chest: CTA Bilaterally without wheezes or rales. Normal effort    GI/Abdomen: BS +, non-tender, non-distended Ext: no clubbing, cyanosis Psych: pleasant and cooperative Skin: right bk CDI with staples.  Neuro:  Pt is cognitively appropriate with normal insight, memory, and awareness. Cranial nerves 2-12 are intact. Sensory exam is normal. Reflexes are 2+ in all 4's. Fine motor coordination is intact. No tremors. Motor function is grossly 5/5 UE and 3/5 RLE 4/5 LLE-stable appearance. Musculoskeletal: right bk stump still swollen, tender.   Assessment/Plan: 1. Functional deficits which require 3+ hours per day of interdisciplinary therapy in a comprehensive inpatient rehab setting.  Physiatrist is providing close team supervision and 24 hour management of active medical problems listed below.  Physiatrist and rehab team continue to assess barriers to discharge/monitor patient progress toward functional and medical goals  Care Tool:  Bathing    Body parts bathed by  patient: Right arm,Left arm,Chest,Abdomen,Right upper leg,Left upper leg,Left lower leg,Front perineal area,Buttocks   Body parts bathed by helper:  (declined pericare) Body parts n/a: Right lower leg   Bathing assist Assist Level: Set up assist (seated, shower level)     Upper Body Dressing/Undressing Upper body dressing   What is the patient wearing?: Pull over shirt    Upper body assist Assist Level: Set up assist    Lower Body Dressing/Undressing Lower body dressing      What is the patient wearing?: Pants     Lower body assist Assist for lower body dressing: Moderate Assistance - Patient 50 - 74%     Toileting Toileting    Toileting assist Assist for toileting: Minimal Assistance - Patient > 75%     Transfers Chair/bed transfer  Transfers assist     Chair/bed transfer assist level: Supervision/Verbal cueing     Locomotion Ambulation   Ambulation assist   Ambulation activity did not occur: Safety/medical concerns  Assist level: Contact Guard/Touching assist Assistive device: Walker-rolling Max distance: 35'   Walk 10 feet activity   Assist  Walk 10 feet activity did not occur: Safety/medical concerns  Assist level: Contact Guard/Touching assist Assistive device: Walker-rolling   Walk 50 feet activity   Assist Walk 50 feet with 2 turns activity did not occur: Safety/medical concerns         Walk 150 feet activity   Assist Walk 150 feet activity did not occur: Safety/medical concerns         Walk 10 feet on uneven surface  activity   Assist Walk 10 feet on uneven surfaces activity did not occur: Safety/medical concerns         Wheelchair     Assist Will patient use wheelchair at discharge?: Yes Type of Wheelchair: Manual    Wheelchair assist level: Supervision/Verbal cueing Max wheelchair distance: 300'    Wheelchair 50 feet with 2 turns activity    Assist        Assist Level: Supervision/Verbal cueing    Wheelchair 150 feet activity     Assist  Wheelchair 150 feet activity did not occur:  (fatigue)   Assist Level: Supervision/Verbal cueing   Blood pressure 116/68, pulse 88, temperature 98.5 F (36.9 C), temperature source Oral, resp. rate 16, height 5\' 9"  (1.753 m), weight 74 kg, SpO2 100 %.  Medical Problem List and Plan: 1.Functional and mobility deficitssecondary to gangrene of the right foot leading to a right below-knee amputation -patient may showerif the right below-knee amputation site is covered -ELOS/Goals: 03/25/21, Supervision to modified independent goals for PT and OT.     2. Antithrombotics: -DVT/anticoagulation:Pharmaceutical:Continue Heparin5000 units every 8 hours -antiplatelet therapy: 81 mg of aspirin daily,Plavix 75 mg daily 3. Pain Management:Tylenol for mild pain -Oxycodone for moderate to severe  pain--using sparingly -Robaxin as needed for spasms -Gabapentin 100 mg daily for phantom limb pain -desensitization techniques 4. Mood:Team to provide ego support as needed -antipsychotic agents: N/AA -appears depressed, angry at times. Struggling with his role currently  -would benefit from neuropsych eval 5. Neuropsych: This patientiscapable of making decisions on hisown behalf. 6. Skin/Wound Care:  dry dressing with Ace wrap to right below knee amputation stump daily  -mayFloat left heel to prevent breakdown  -may have him fit with shrinker sock tomorrow as wound looks good 7. Fluids/Electrolytes/Nutrition:Encourage adequate p.o. We discussed today the importance of his nutrition as a reflects upon his strength and wound healing. -Patient with low albumin: Continue daily supplements 8.End-stage renal disease on hemodialysis. -Patient on a Monday Wednesday Friday schedule at this point -Dialysis  scheduled after therapy to maximize therapy participation and tolerance 9.Hypotension: Observe blood pressures while on the unit and especially after hemodialysis days  -bp reasonable 3/24 10.Acute on chronic anemia: Patient on Aranesp weekly. Hgb 8.5 on 3/18, continueto monitor serial hemoglobins with dialysis.  11.Type 2 diabetes.Borderline control at present -Continue NovoLog 3 units with meals as well as Lantus 10 units daily -CBGs before meals and at bedtime with sliding scale insulin -3/23 cbg's still elevated --increased lantus to 15u with 4u meal covg   -3/24--observe sugars today  LOS: 6 days A FACE TO FACE EVALUATION WAS PERFORMED  Meredith Staggers 03/22/2021, 10:44 AM

## 2021-03-22 NOTE — Progress Notes (Signed)
Occupational Therapy Session Note  Patient Details  Name: Duane Hall MRN: [id number] Date of Birth: [birth year]  Today's Date: 03/22/2021 OT Individual Time: 8115-7262 OT Individual Time Calculation (min): 83 min   Short Term Goals: Week 1:  OT Short Term Goal 1 (Week 1): Pt will complete BSC/toilet transfer with close S. OT Short Term Goal 2 (Week 1): Pt will complete STS in prep for standing ADL with CGA. OT Short Term Goal 3 (Week 1): Pt will complete toileting with min A. OT Short Term Goal 4 (Week 1): Pt will incorporate at least 2 amputee education points to incorporate into daily routine (skin check, desensitizaiton tech, etc).  Skilled Therapeutic Interventions/Progress Updates:    Patient greeted seated in wc after just threading pants. Pt reports he was about to transfer over to bed so that he could pull them up. Educated on getting dressed at EOB so he does not have to do an extra transfer. Supervision squat-pivot over to bed. OT provided pt with handout on residual limb ACE wrapping and had patient practice on rolled up towels before practicing on himself. OT also provided handout on ways to decrease sensation and phantom limb pain including massage and pressure. Pt used lateral leans to pull up pants with supervision. OT discussed home set-up and pt shared pictures of bathroom with OT. Discussed options for toilet and shower transfers and possible use of 3-in-1 BSC in shower as a seat. Pt is not sure he would like this, but is willing to try at next session. OT demonstrated and talked through stepping over small shower ledge using RW. OT also provided hadnout for residual limb there-ex and talked through exercises and positioning for maximal R knee extension. OT encouraged pt to always have R residual limb pad on and not to let R leg hang over then edge of wc with knee flexed. Pt transferred back to wc at end of session with OT set-up of wc. Pt left seated in wc with needs met.    Therapy Documentation Precautions:  Precautions Precautions: Fall Precaution Comments: R BKA Restrictions Weight Bearing Restrictions: Yes RLE Weight Bearing: Non weight bearing Pain:   denies pain  Therapy/Group: Individual Therapy  Valma Cava 03/22/2021, 9:51 AM

## 2021-03-22 NOTE — Progress Notes (Signed)
Subjective: No complaints sitting up in wheelchair, said tolerated dialysis yesterday  Objective Vital signs in last 24 hours: Vitals:   03/21/21 1730 03/21/21 1847 03/21/21 1929 03/22/21 0552  BP: 134/66 (!) 95/57 103/60 116/68  Pulse: 87 89 86 88  Resp: 18 17 16 16   Temp: 98.5 F (36.9 C) 98.2 F (36.8 C) 98.5 F (36.9 C) 98.5 F (36.9 C)  TempSrc: Oral Oral  Oral  SpO2: 100%  100% 100%  Weight: 74 kg     Height:       Weight change:   Physical Exam: General:Alert,WD, WN male NAD Heart:RRR no MRG Lungs:CTA, nonlabored breathing room air Abdomen:BS positive, soft NTND Extremities:Right BKA, no pedal edema Dialysis Access:RUA AVF+ bruit  OP HD:[facility] home therapies. 4 HD per week, MTuTh Fri. 76kg edw Hep 3000  Problem/plan  1. Sepsis D/TDiabetic foot wound- MRI + osteo R foot.Status post RBKA 03/14/2021,IV abx w/ improving BP's Now in CIR 2. Hypokalemia-resolved,lastK+ 3.7, follow-up labs predialysisuse 3K bath 3. ESRD-OPHome hemo patient. Plan 3 HD per week while hereon MWF, 3.5 - 4h as needed. 4. Hypotension/ volume:BPstable.no vol excess,minimal UF goals with HD.  5. Anemiaof ESRDHgb8.2 <8.0<8.5, started onDarbepoetin 60 mcg weekly.Tsat 33% on 03/15/21. 6. Metabolic bone disease- Corrected calcium at goal.On Auryxia bindersas outpatient continue in hospital with Phos 5.4, no VDRA on medication list from [place].Check PTH next treatment 7. Nutrition- Albumin low2.4<2.1. Changed to renal/carb mod diet with fluid restrictions. Addedprosource and renal vits. 8. DMT2-per primary 9. H/O Hep C 10. H/O COPD, tobacco use.  11. H/O PAD onclopidogrel  Ernest Haber, PA-C [facility] [phone] 03/22/2021,10:45 AM  LOS: 6 days   Labs: Basic Metabolic Panel: Recent Labs  Lab 03/16/21 0239 03/19/21 1439 03/21/21 1334  NA 138 136 138  K 4.0 4.1 3.7  CL 102 101 102  CO2 28 26 27   GLUCOSE 141* 245* 170*   BUN 27* 24* 16  CREATININE 5.64* 7.36* 6.69*  CALCIUM 7.6* 7.7* 7.9*  PHOS  --  5.9* 5.4*   Liver Function Tests: Recent Labs  Lab 03/19/21 1439 03/21/21 1334  ALBUMIN 2.1* 2.4*   No results for input(s): LIPASE, AMYLASE in the last 168 hours. No results for input(s): AMMONIA in the last 168 hours. CBC: Recent Labs  Lab 03/16/21 0239 03/19/21 1439 03/21/21 1334  WBC 5.6 5.4 5.1  HGB 8.5* 8.0* 8.2*  HCT 26.8* 24.7* 25.8*  MCV 97.5 97.2 98.1  PLT 223 157 147*   Cardiac Enzymes: No results for input(s): CKTOTAL, CKMB, CKMBINDEX, TROPONINI in the last 168 hours. CBG: Recent Labs  Lab 03/21/21 0601 03/21/21 1206 03/21/21 1844 03/21/21 2100 03/22/21 0626  GLUCAP 235* 160* 171* 184* 145*    Studies/Results: No results found. Medications: . sodium chloride    . sodium chloride     . acidophilus  2 capsule Oral Daily  . aspirin  81 mg Oral Daily  . Chlorhexidine Gluconate Cloth  6 each Topical Q0600  . clopidogrel  75 mg Oral Daily  . darbepoetin (ARANESP) injection - DIALYSIS  60 mcg Intravenous Q Wed-HD  . feeding supplement  1 Container Oral TID BM  . ferric citrate  210 mg Oral TID WC  . gabapentin  100 mg Oral Daily  . heparin  3,000 Units Dialysis Once in dialysis  . heparin  5,000 Units Subcutaneous Q8H  . insulin aspart  0-15 Units Subcutaneous TID WC  . insulin aspart  0-5 Units Subcutaneous QHS  . insulin  aspart  4 Units Subcutaneous TID WC  . insulin glargine  15 Units Subcutaneous Daily  . multivitamin  1 tablet Oral QHS  . pantoprazole  40 mg Oral Daily  . rosuvastatin  10 mg Oral q morning

## 2021-03-22 NOTE — Telephone Encounter (Signed)
Patient's wife [name] requests to be called at ph# [phone] re: Patient is going to be coming to his home (between 03/25/21 and 03/28/21) after leaving the Hospital from having an amputation and [name] has questions about controlling Patient's Diabetes once he gets home.

## 2021-03-22 NOTE — Plan of Care (Signed)
  Problem: RH SKIN INTEGRITY Goal: RH STG SKIN FREE OF INFECTION/BREAKDOWN Description: Skin will remain free from infection and breakdown while on rehab with supervision assist. Outcome: Progressing   Problem: RH SKIN INTEGRITY Goal: RH STG ABLE TO PERFORM INCISION/WOUND CARE W/ASSISTANCE Description: STG Able To Perform Incision/Wound Care With supervision Assistance. Outcome: Progressing   Problem: RH PAIN MANAGEMENT Goal: RH STG PAIN MANAGED AT OR BELOW PT'S PAIN GOAL Description: <4 on a 0-10 pain scale. Outcome: Progressing   Problem: RH SAFETY Goal: RH STG ADHERE TO SAFETY PRECAUTIONS W/ASSISTANCE/DEVICE Description: STG Adhere to Safety Precautions With supervision Assistance/Device. Outcome: Progressing

## 2021-03-23 LAB — RENAL FUNCTION PANEL
Albumin: 2.5 g/dL — ABNORMAL LOW (ref 3.5–5.0)
Anion gap: 4 — ABNORMAL LOW (ref 5–15)
BUN: 18 mg/dL (ref 8–23)
CO2: 29 mmol/L (ref 22–32)
Calcium: 8.3 mg/dL — ABNORMAL LOW (ref 8.9–10.3)
Chloride: 105 mmol/L (ref 98–111)
Creatinine, Ser: 5.76 mg/dL — ABNORMAL HIGH (ref 0.61–1.24)
GFR, Estimated: 10 mL/min — ABNORMAL LOW (ref >60)
Glucose, Bld: 44 mg/dL — CL (ref 70–99)
Phosphorus: 2.7 mg/dL (ref 2.5–4.6)
Potassium: 4.2 mmol/L (ref 3.5–5.1)
Sodium: 138 mmol/L (ref 135–145)

## 2021-03-23 LAB — CBC
HCT: 26.4 % — ABNORMAL LOW (ref 39.0–52.0)
Hemoglobin: 8.3 g/dL — ABNORMAL LOW (ref 13.0–17.0)
MCH: 30.7 pg (ref 26.0–34.0)
MCHC: 31.4 g/dL (ref 30.0–36.0)
MCV: 97.8 fL (ref 80.0–100.0)
Platelets: 149 10*3/uL — ABNORMAL LOW (ref 150–400)
RBC: 2.7 MIL/uL — ABNORMAL LOW (ref 4.22–5.81)
RDW: 14.1 % (ref 11.5–15.5)
WBC: 6.7 10*3/uL (ref 4.0–10.5)
nRBC: 0 % (ref 0.0–0.2)

## 2021-03-23 LAB — GLUCOSE, CAPILLARY
Glucose-Capillary: 186 mg/dL — ABNORMAL HIGH (ref 70–99)
Glucose-Capillary: 84 mg/dL (ref 70–99)
Glucose-Capillary: 275 mg/dL — ABNORMAL HIGH (ref 70–99)
Glucose-Capillary: 178 mg/dL — ABNORMAL HIGH (ref 70–99)

## 2021-03-23 MED ORDER — ACETAMINOPHEN 325 MG PO TABS: 650.0000 mg | ORAL_TABLET | Freq: Four times a day (QID) | ORAL | Status: DC | PRN

## 2021-03-23 MED ORDER — INSULIN ASPART 100 UNIT/ML ~~LOC~~ SOLN
3.0000 [IU] | Freq: Three times a day (TID) | SUBCUTANEOUS | Status: DC
  Administered 2021-03-23 (×2): 3 [IU] via SUBCUTANEOUS

## 2021-03-23 NOTE — Progress Notes (Signed)
PROGRESS NOTE   Subjective/Complaints: Pt up in the gym. No new complaints. Still has stump/phantom pain but working through it.   ROS: Patient denies fever, rash, sore throat, blurred vision, nausea, vomiting, diarrhea, cough, shortness of breath or chest pain,  headache, or mood change.      Objective:   No results found. Recent Labs    03/21/21 1334  WBC 5.1  HGB 8.2*  HCT 25.8*  PLT 147*   Recent Labs    03/21/21 1334  NA 138  K 3.7  CL 102  CO2 27  GLUCOSE 170*  BUN 16  CREATININE 6.69*  CALCIUM 7.9*    Intake/Output Summary (Last 24 hours) at 03/23/2021 1021 Last data filed at 03/22/2021 1812 Gross per 24 hour  Intake 340 ml  Output -  Net 340 ml     Pressure Injury 06/19/20 Heel Left Unstageable - Full thickness tissue loss in which the base of the injury is covered by slough (yellow, tan, gray, green or brown) and/or eschar (tan, brown or black) in the wound bed. eschar  (Active)  06/19/20 2010  Location: Heel  Location Orientation: Left  Staging: Unstageable - Full thickness tissue loss in which the base of the injury is covered by slough (yellow, tan, gray, green or brown) and/or eschar (tan, brown or black) in the wound bed.  Wound Description (Comments): eschar   Present on Admission: Yes     Pressure Injury 06/19/20 Toe (Comment  which one) Bilateral;Right;Left Stage 2 -  Partial thickness loss of dermis presenting as a shallow open injury with a red, pink wound bed without slough. scab formed on both toes (Active)  06/19/20 2010  Location: Toe (Comment  which one) (great/big toe)  Location Orientation: Bilateral;Right;Left  Staging: Stage 2 -  Partial thickness loss of dermis presenting as a shallow open injury with a red, pink wound bed without slough.  Wound Description (Comments): scab formed on both toes  Present on Admission: Yes     Pressure Injury 06/19/20 Heel Right Stage 2 -   Partial thickness loss of dermis presenting as a shallow open injury with a red, pink wound bed without slough. skin tear (Active)  06/19/20 2010  Location: Heel  Location Orientation: Right  Staging: Stage 2 -  Partial thickness loss of dermis presenting as a shallow open injury with a red, pink wound bed without slough.  Wound Description (Comments): skin tear  Present on Admission: Yes    Physical Exam: Vital Signs Blood pressure 132/78, pulse 89, temperature 98 F (36.7 C), temperature source Oral, resp. rate 18, height 5\' 9"  (1.753 m), weight 74 kg, SpO2 100 %. Constitutional: No distress . Vital signs reviewed. HEENT: EOMI, oral membranes moist Neck: supple Cardiovascular: RRR without murmur. No JVD    Respiratory/Chest: CTA Bilaterally without wheezes or rales. Normal effort    GI/Abdomen: BS +, non-tender, non-distended Ext: no clubbing, cyanosis, or edema Psych: pleasant and cooperative Skin: right bk CDI with staples.  Neuro: Pt is cognitively appropriate with normal insight, memory, and awareness. Cranial nerves 2-12 are intact. Sensory exam is normal. Reflexes are 2+ in all 4's. Fine motor coordination is intact. No  tremors. Motor function is grossly 5/5 UE and 3/5 RLE 4/5 LLE-stable. Musculoskeletal: right bk stump with ongoing edema. Tender. Able to fully extend knee.   Assessment/Plan: 1. Functional deficits which require 3+ hours per day of interdisciplinary therapy in a comprehensive inpatient rehab setting.  Physiatrist is providing close team supervision and 24 hour management of active medical problems listed below.  Physiatrist and rehab team continue to assess barriers to discharge/monitor patient progress toward functional and medical goals  Care Tool:  Bathing    Body parts bathed by patient: Right arm,Left arm,Chest,Abdomen,Right upper leg,Left upper leg,Left lower leg,Front perineal area,Buttocks   Body parts bathed by helper:  (declined pericare) Body  parts n/a: Right lower leg   Bathing assist Assist Level: Set up assist (seated, shower level)     Upper Body Dressing/Undressing Upper body dressing   What is the patient wearing?: Pull over shirt    Upper body assist Assist Level: Set up assist    Lower Body Dressing/Undressing Lower body dressing      What is the patient wearing?: Pants     Lower body assist Assist for lower body dressing: Moderate Assistance - Patient 50 - 74%     Toileting Toileting    Toileting assist Assist for toileting: Minimal Assistance - Patient > 75%     Transfers Chair/bed transfer  Transfers assist     Chair/bed transfer assist level: Independent with assistive device Chair/bed transfer assistive device:  (WC)   Locomotion Ambulation   Ambulation assist   Ambulation activity did not occur: Safety/medical concerns  Assist level: Supervision/Verbal cueing Assistive device: Walker-rolling Max distance: 20'   Walk 10 feet activity   Assist  Walk 10 feet activity did not occur: Safety/medical concerns  Assist level: Supervision/Verbal cueing Assistive device: Walker-rolling   Walk 50 feet activity   Assist Walk 50 feet with 2 turns activity did not occur: Safety/medical concerns         Walk 150 feet activity   Assist Walk 150 feet activity did not occur: Safety/medical concerns         Walk 10 feet on uneven surface  activity   Assist Walk 10 feet on uneven surfaces activity did not occur: Safety/medical concerns         Wheelchair     Assist Will patient use wheelchair at discharge?: Yes Type of Wheelchair: Manual    Wheelchair assist level: Supervision/Verbal cueing Max wheelchair distance: 300'    Wheelchair 50 feet with 2 turns activity    Assist        Assist Level: Supervision/Verbal cueing   Wheelchair 150 feet activity     Assist  Wheelchair 150 feet activity did not occur:  (fatigue)   Assist Level:  Supervision/Verbal cueing   Blood pressure 132/78, pulse 89, temperature 98 F (36.7 C), temperature source Oral, resp. rate 18, height 5\' 9"  (1.753 m), weight 74 kg, SpO2 100 %.  Medical Problem List and Plan: 1.Functional and mobility deficitssecondary to gangrene of the right foot leading to a right below-knee amputation -patient may showerif the right below-knee amputation site is covered -ELOS/Goals: 03/25/21, Supervision to modified independent goals for PT and OT.    -3/25 asked [employer] to fit with shrinker and provide prosthetic ed 2. Antithrombotics: -DVT/anticoagulation:Pharmaceutical:Continue Heparin5000 units every 8 hours -antiplatelet therapy: 81 mg of aspirin daily,Plavix 75 mg daily 3. Pain Management:Tylenol for mild pain -Oxycodone for moderate to severe pain--using sparingly -Robaxin as needed for spasms -Gabapentin 100 mg daily for  phantom limb pain -desensitization techniques 4. Mood:Team to provide ego support as needed -antipsychotic agents: N/AA -appears depressed, angry at times. Struggling with his role currently  -would benefit from neuropsych eval 5. Neuropsych: This patientiscapable of making decisions on hisown behalf. 6. Skin/Wound Care:    3/25 transition to shrinker  -mayFloat left heel to prevent breakdown    7. Fluids/Electrolytes/Nutrition:Encourage adequate p.o. We discussed today the importance of his nutrition as a reflects upon his strength and wound healing. -Patient with low albumin: Continue daily supplements 8.End-stage renal disease on hemodialysis. -Patient on a Monday Wednesday Friday schedule at this point -Dialysis scheduled after therapy to maximize therapy participation and tolerance 9.Hypotension: Observe blood pressures while on the unit and especially after hemodialysis  days  -normotensive 3/25 10.Acute on chronic anemia: Patient on Aranesp weekly. Hgb 8.2 on 3/23, continueto monitor serial hemoglobins with dialysis.  11.Type 2 diabetes.Borderline control at present -Continue NovoLog 3 units with meals as well as Lantus 10 units daily -CBGs before meals and at bedtime with sliding scale insulin -3/23 cbg's still elevated --increased lantus to 15u with 4u meal covg   -3/25 became hypoglycemic last evening. Ate 100%   -will back down on novolog to 3u again   -AM cbg's still need better control  LOS: 7 days A FACE TO FACE EVALUATION WAS PERFORMED  Meredith Staggers 03/23/2021, 10:21 AM

## 2021-03-23 NOTE — Progress Notes (Signed)
Subjective:   Seen in HD unit at start of treatment. Tired from therapy, some stump pain  No new complaints   Objective Vital signs in last 24 hours: Vitals:   03/22/21 0552 03/22/21 1648 03/22/21 2021 03/23/21 0418  BP: 116/68 (!) 114/58 (!) 122/57 132/78  Pulse: 88 80 87 89  Resp: 16 18 18 18   Temp: 98.5 F (36.9 C) 98.3 F (36.8 C) 98.2 F (36.8 C) 98 F (36.7 C)  TempSrc: Oral Oral Oral Oral  SpO2: 100% 99% 99% 100%  Weight:      Height:        Physical Exam: General:Well appearing, nad  Heart:RRR no MRG Lungs:CTA, nonlabored breathing room air Abdomen:BS positive, soft NTND Extremities:R BKA No LE edema  Dialysis Access:RUA AVF+ bruit  OP HD:[facility] home therapies. 4 HD per week, MTuTh Fri. 76kg edw Hep 3000  Problem/plan  1. Sepsis D/TDiabetic foot wound- MRI + osteo R foot.Status post RBKA 03/14/2021.  Now in CIR 2. Hypokalemia-resolved. K+ 3.7. Using 3K bath on HD  3. ESRD-Home HD. Plan 3 HD per week while hereon MWF, 3.5 - 4h as needed. 4. Hypotension/ volume:BPstable.no vol excess,minimal UF goals with HD.  5. Anemiaof ESRDHgb8.2 <8.0<8.5, started onDarbepoetin 60 mcg weekly.Tsat 33% on 03/15/21. 6. Metabolic bone disease- Corrected calcium at goal.On Auryxia bindersas outpatient continue in hospital with Phos 5.4, no VDRA on medication list from [place].Check PTH next treatment 7. Nutrition- Albumin low2.4 Changed to renal/carb mod diet with fluid restrictions. Addedprosource and renal vits. 8. DMT2-per primary 9. H/O Hep C 10. H/O COPD, tobacco use.  11. H/O PAD onclopidogrel   Lynnda Child PA-C [facility] 03/23/2021,8:46 AM   Labs: Basic Metabolic Panel: Recent Labs  Lab 03/19/21 1439 03/21/21 1334  NA 136 138  K 4.1 3.7  CL 101 102  CO2 26 27  GLUCOSE 245* 170*  BUN 24* 16  CREATININE 7.36* 6.69*  CALCIUM 7.7* 7.9*  PHOS 5.9* 5.4*   Liver Function Tests: Recent Labs   Lab 03/19/21 1439 03/21/21 1334  ALBUMIN 2.1* 2.4*   No results for input(s): LIPASE, AMYLASE in the last 168 hours. No results for input(s): AMMONIA in the last 168 hours. CBC: Recent Labs  Lab 03/19/21 1439 03/21/21 1334  WBC 5.4 5.1  HGB 8.0* 8.2*  HCT 24.7* 25.8*  MCV 97.2 98.1  PLT 157 147*   Cardiac Enzymes: No results for input(s): CKTOTAL, CKMB, CKMBINDEX, TROPONINI in the last 168 hours. CBG: Recent Labs  Lab 03/22/21 1215 03/22/21 1645 03/22/21 2053 03/22/21 2114 03/23/21 0553  GLUCAP 74 87 53* 81 186*    Studies/Results: No results found. Medications: . sodium chloride    . sodium chloride     . acidophilus  2 capsule Oral Daily  . aspirin  81 mg Oral Daily  . Chlorhexidine Gluconate Cloth  6 each Topical Q0600  . clopidogrel  75 mg Oral Daily  . [START ON 03/28/2021] darbepoetin (ARANESP) injection - DIALYSIS  100 mcg Intravenous Q Wed-HD  . feeding supplement  1 Container Oral TID BM  . ferric citrate  210 mg Oral TID WC  . gabapentin  100 mg Oral Daily  . heparin  3,000 Units Dialysis Once in dialysis  . heparin  5,000 Units Subcutaneous Q8H  . insulin aspart  0-15 Units Subcutaneous TID WC  . insulin aspart  0-5 Units Subcutaneous QHS  . insulin aspart  4 Units Subcutaneous TID WC  . insulin glargine  15 Units  Subcutaneous Daily  . multivitamin  1 tablet Oral QHS  . pantoprazole  40 mg Oral Daily  . rosuvastatin  10 mg Oral q morning

## 2021-03-23 NOTE — Progress Notes (Addendum)
Patient ID: Duane Hall, male   DOB: Jun 24, 1956, 65 y.o.   MRN: [id number]  SW ordered R amputee pad for pt with [employer] via parachute.   SW left message for [name] [phone]) to discuss if TTB should be purchased or if they were able to find item, and inform on follow-up from [place] about amputee pad being ordered. SW waiting on follow-up.  *Sw spoke with pt wife who inquired if a RW, slide board were needed. She mentioned concerns related to no grab bars around toilet and if covered under insurance. SW informed item is not covered, however, if concerns related to safety a 3in1 BSC typically is recommended. She also wanted to know if patient staples will come out before discharge. Attending reported staples will not come out for 3-4 weeks.She also states that the ramp is being completed by a neighbor's church and hopefully will be done within the next three days. SW informed her concerns will be relayed to medical team.   *Updates from therapist stating pt will need RW and DABSC. SW updated pt wife and pt.   Loralee Pacas, MSW, [place] Office: [phone] Cell: [phone] Fax: [phone]

## 2021-03-23 NOTE — Progress Notes (Signed)
Physical Therapy Session Note  Patient Details  Name: Duane Hall MRN: [id number] Date of Birth: [birth year]  Today's Date: 03/23/2021 PT Individual Time: 0802-0914 PT Individual Time Calculation (min): 72 min   Short Term Goals: Week 1:  PT Short Term Goal 1 (Week 1): Pt will complete bed<>chair transfers with supervision PT Short Term Goal 2 (Week 1): Pt will propel himself in w/c 131ft with supervision PT Short Term Goal 3 (Week 1): Pt will ambulate 87ft with minA and LRAD PT Short Term Goal 4 (Week 1): Pt will initiate stair training  Skilled Therapeutic Interventions/Progress Updates:     Pt received seated in [facility] and agrees to therapy. Early in session pt mentions crampin pain in R residual lower limb, likely "phantom pain". PT provides education on likely cause of pain as well as importance of maintaining R knee in extension at rest to prevent contracture. Pt verbalizes understanding. Pt self propels WC x300' mod(I). Pt transfers to nustep with setup assistance. Pt completes Nustep for 10:00 at workload of 4 with x1 rest break with average steps per minute ~40. PT provides cues for body mechanics and optimal performance. Performed for strength and endurance training. Pt transfers from [facility] with setup assistance.   Pt performs NMR for standing balance on airex pad to decrease somatosensory input. Pt initially stands on airex with RW and supervision. Pt progresses to reaching above eye level to retrieve clothespins with L then R hands and placing clothespins at lower level. Progressing to performin clothespins activity and incorporating reaching across body, with PT providing CGA to minA at hips for stability. Pt then ambulates x40' and x30' with RW and close supervision, with cues for posture and increasing proximity to RW for safety. Pt left seated in WC with all needs within reach.  Therapy Documentation Precautions:  Precautions Precautions: Fall Precaution Comments: R  BKA Restrictions Weight Bearing Restrictions: Yes RLE Weight Bearing: Non weight bearing  Therapy/Group: Individual Therapy  Breck Coons, PT, DPT 03/23/2021, 4:40 PM

## 2021-03-23 NOTE — Progress Notes (Signed)
Physical Therapy Session Note  Patient Details  Name: Duane Hall MRN: [id number] Date of Birth: Jan 17, 1956  Today's Date: 03/23/2021 PT Group Time:  0941- 1013 32 minutes    Short Term Goals: Week 1:  PT Short Term Goal 1 (Week 1): Pt will complete bed<>chair transfers with supervision PT Short Term Goal 2 (Week 1): Pt will propel himself in w/c 180ft with supervision PT Short Term Goal 3 (Week 1): Pt will ambulate 5ft with minA and LRAD PT Short Term Goal 4 (Week 1): Pt will initiate stair training  Skilled Therapeutic Interventions/Progress Updates:    Patient up in w/c taking meds with RN in the room.  Reports mild phantom pain. Applied legrests to w/c with S and propelled to therapy gym with S.  Patient in parallel bars sit to stand with cues and close S.  Performed step up onto 6" step with both rails x 6 reps.  Patient seated noted flexion and some shaping issues with residual limb, but reports MD planning prosthetist visit possibly later today.  PAtient at steps demonstrated step up onto 3" step with crutch and rail and pt performed x 2 to first step.  Then pt reports wife working on getting ramp installed and should be there by Sunday.  Patient declined ambulation further toward room stating had already walked with PT earlier and fatigued from step practice.  Propelled in w/c to room and left with call bell/needs in reach.  Therapy Documentation Precautions:  Precautions Precautions: Fall Precaution Comments: R BKA Restrictions Weight Bearing Restrictions: Yes RLE Weight Bearing: Non weight bearing Pain: Pain Assessment Pain Score: 2  Pain Type: Phantom pain Pain Location: Leg Pain Orientation: Right Pain Descriptors / Indicators: Discomfort Pain Onset: On-going Pain Intervention(s): Distraction   Therapy/Group: Individual Therapy  Reginia Naas  Magda Kiel, PT 03/23/2021, 12:47 PM

## 2021-03-23 NOTE — Progress Notes (Signed)
Occupational Therapy Session Note  Patient Details  Name: Duane Hall MRN: [id number] Date of Birth: 11/11/1956  Today's Date: 03/23/2021 OT Individual Time: 1050-1200 OT Individual Time Calculation (min): 70 min   Short Term Goals: Week 1:  OT Short Term Goal 1 (Week 1): Pt will complete BSC/toilet transfer with close S. OT Short Term Goal 2 (Week 1): Pt will complete STS in prep for standing ADL with CGA. OT Short Term Goal 3 (Week 1): Pt will complete toileting with min A. OT Short Term Goal 4 (Week 1): Pt will incorporate at least 2 amputee education points to incorporate into daily routine (skin check, desensitizaiton tech, etc).  Skilled Therapeutic Interventions/Progress Updates:    Pt greeted semi-reclined in bed and agreeable to OT treatment session. Pt transferred from bed to drop arm wc mod I. Pt propelled wc to therapy apartment. OT set-up walk-in shower transfer in simulated home environment. Practiced stepping over shower ledge using RW and turning to sit on BSC in shower. Educated on benefits of using BSC as a shower chair given the handrails to help him stand. Pt able to step over and turn with CGA. Pt brought to into bathroom and discussed pt's home bathroom set-up. Pt showed OT pictures. We discussed use of 3-in-1 BSC over toilet in bathroom, but pt stated he did not like the toilet seat on BSC, but understood how rails could be beneficial. OT had pt look up toilet safety rails that are just rails to go around commode. Pt liked this idea and sent to wife. Pt then propelled wc to therapy gym and transferred onto therapy mat mod I. Pt brought into prone and completed hip extension 3 sets of 10, then pt brought into slidelying for  Hip adduction, abduction. Discussed DME at home and had his wife send a picture of his walker. Pt's walker is a rollator. OT explained why a rollator was not appropriate for DME right now as he needs a RW for stability and standing balance. Explained the  4 wheels on a rollator make it less than ideal for balance. Pt eventually understood why he would need a RW at discharge. Pt transferred back to wc and propelled wc back to room. Pt left seated in wc with needs met.  Therapy Documentation Precautions:  Precautions Precautions: Fall Precaution Comments: R BKA Restrictions Weight Bearing Restrictions: Yes RLE Weight Bearing: Non weight bearing GPain:  Pt reports pain was managed  Therapy/Group: Individual Therapy  Valma Cava 03/23/2021, 11:49 AM

## 2021-03-23 NOTE — Progress Notes (Signed)
Physical Therapy Discharge Summary  Patient Details  Name: Duane Hall MRN: [id number] Date of Birth: August 28, 1956  Patient has met 10 of 11 long term goals due to improved activity tolerance, improved balance, improved postural control and increased strength.  Patient to discharge at a wheelchair level Modified Independent.   Patient's care partner is independent to provide the necessary physical assistance at discharge.  Reasons goals not met: Pt had ramp built so stair goal not met but adequate for discharge.  Recommendation:  Patient will benefit from ongoing skilled PT services in outpatient setting to continue to advance safe functional mobility, address ongoing impairments in strength, balance, ambulation, and minimize fall risk.  Equipment: RW, R amputee pad  Reasons for discharge: treatment goals met and discharge from hospital  Patient/family agrees with progress made and goals achieved: Yes  PT Discharge Precautions/Restrictions Precautions Precautions: Fall Precaution Comments: R BKA Restrictions Weight Bearing Restrictions: Yes Cognition Overall Cognitive Status: Within Functional Limits for tasks assessed Arousal/Alertness: Awake/alert Orientation Level: Oriented X4 Sustained Attention: Appears intact Memory: Appears intact Behaviors: Impulsive Safety/Judgment: Impaired Sensation Sensation Light Touch: Appears Intact Hot/Cold: Appears Intact Coordination Gross Motor Movements are Fluid and Coordinated: No Fine Motor Movements are Fluid and Coordinated: Yes Motor  Motor Motor - Discharge Observations: Generalized weakness  Mobility Bed Mobility Supine to Sit: Independent with assistive device Sit to Supine: Independent with assistive device Scooting to [facility]: Independent with assistive device Transfers Transfers: Sit to Stand;Stand to Sit;Squat Pivot Transfers Sit to Stand: Independent with assistive device Stand to Sit: Independent with assistive  device Squat Pivot Transfers: Independent with assistive device Locomotion  Gait Ambulation: Yes Gait Assistance: Supervision/Verbal cueing Gait Distance (Feet): 40 Feet Assistive device: Rolling walker Gait Assistance Details: Verbal cues for safe use of DME/AE;Verbal cues for technique Gait Gait: Yes Gait Pattern: Impaired Gait Pattern:  (hopping with L lower extremity) Gait velocity: decreased Stairs / Additional Locomotion Stairs: Yes Stairs Assistance: Moderate Assistance - Patient 50 - 74% Stair Management Technique: One rail Right Number of Stairs: 2 Height of Stairs: 6 Curb: Moderate Assistance - Patient 50 - 74% Wheelchair Mobility Wheelchair Mobility: Yes Wheelchair Assistance: Independent with Camera operator: Both upper extremities Wheelchair Parts Management: Independent Distance: 300'  Trunk/Postural Assessment  Cervical Assessment Cervical Assessment:  (forward head) Thoracic Assessment Thoracic Assessment:  (rounded shoulders) Lumbar Assessment Lumbar Assessment:  (posterior pelvic tilt) Postural Control Postural Control: Within Functional Limits  Balance Static Sitting Balance Static Sitting - Balance Support: No upper extremity supported;Feet supported Static Sitting - Level of Assistance: 6: Modified independent (Device/Increase time) Dynamic Sitting Balance Dynamic Sitting - Balance Support: During functional activity Dynamic Sitting - Level of Assistance: 6: Modified independent (Device/Increase time) Static Standing Balance Static Standing - Balance Support: During functional activity Static Standing - Level of Assistance: 6: Modified independent (Device/Increase time) Extremity Assessment  RUE Assessment RUE Assessment: Within Functional Limits LUE Assessment LUE Assessment: Within Functional Limits RLE Assessment RLE Assessment: Exceptions to [facility] Strength Comments: hip flex 3+/5, knee ext 3/5 LLE  Assessment LLE Assessment: Within Functional Limits General Strength Comments: Grossly 4+/5    Breck Coons , PT, DPT 03/23/2021, 4:56 PM

## 2021-03-23 NOTE — Progress Notes (Signed)
Occupational Therapy Discharge Summary  Patient Details  Name: Duane Hall MRN: [id number] Date of Birth: 11-Jun-1956   Patient has met 11 of 11 long term goals due to improved activity tolerance, improved balance, postural control and ability to compensate for deficits.  Patient to discharge at overall Modified Independent/supervision level.  Patient's care partner is independent to provide the necessary physical assistance at discharge for higher level BADL tasks.    Reasons goals not met: n/a  Recommendation:  Patient will benefit from ongoing skilled OT services in no further OT needs at this time. Will need OT again once he gets his prosthesis.   Equipment: drop arm 3-in-1 BSC, RW, R amputee pad  Reasons for discharge: treatment goals met and discharge from hospital  Patient/family agrees with progress made and goals achieved: Yes  OT Discharge Precautions/Restrictions  Precautions Precautions: Fall Precaution Comments: R BKA Restrictions Weight Bearing Restrictions: Yes Pain Pain Assessment Pain Scale: 0-10 Pain Score: 4  Pain Type: Phantom pain Pain Location: Leg Pain Orientation: Right Pain Descriptors / Indicators: Discomfort Pain Onset: On-going Pain Intervention(s): Emotional support;Distraction;Rest ADL ADL Eating: Independent Where Assessed-Eating: Wheelchair,Bed level Grooming: Independent Where Assessed-Grooming: Sitting at sink,Edge of bed Upper Body Bathing: Modified independent Where Assessed-Upper Body Bathing: Edge of bed Lower Body Bathing: Modified independent Where Assessed-Lower Body Bathing: Edge of bed Upper Body Dressing: Modified independent (Device) Where Assessed-Upper Body Dressing: Edge of bed Lower Body Dressing: Modified independent Where Assessed-Lower Body Dressing: Wheelchair Toileting: Modified independent Where Assessed-Toileting: Bedside Commode Toilet Transfer: Modified independent Armed forces technical officer Method: Pensions consultant: Extra wide bedside commode,Drop arm bedside commode Cognition Overall Cognitive Status: Within Functional Limits for tasks assessed Arousal/Alertness: Awake/alert Orientation Level: Oriented X4 Sustained Attention: Appears intact Memory: Appears intact Behaviors: Impulsive Safety/Judgment: Impaired Sensation Sensation Light Touch: Appears Intact Hot/Cold: Appears Intact Coordination Gross Motor Movements are Fluid and Coordinated: No Fine Motor Movements are Fluid and Coordinated: Yes Motor  Motor Motor - Discharge Observations: Generalized weakness Mobility  Bed Mobility Supine to Sit: Independent with assistive device Sit to Supine: Independent with assistive device Scooting to HOB: Independent with assistive device Transfers Sit to Stand: Independent with assistive device Stand to Sit: Independent with assistive device  Balance Static Sitting Balance Static Sitting - Balance Support: No upper extremity supported;Feet supported Static Sitting - Level of Assistance: 6: Modified independent (Device/Increase time) Dynamic Sitting Balance Dynamic Sitting - Balance Support: During functional activity Dynamic Sitting - Level of Assistance: 6: Modified independent (Device/Increase time) Static Standing Balance Static Standing - Balance Support: During functional activity Static Standing - Level of Assistance: 6: Modified independent (Device/Increase time) Extremity/Trunk Assessment RUE Assessment RUE Assessment: Within Functional Limits LUE Assessment LUE Assessment: Within Functional Limits   Daneen Schick Trellis Vanoverbeke 03/23/2021, 4:12 PM

## 2021-03-23 NOTE — Progress Notes (Signed)
Orthopedic Tech Progress Note Patient Details:  Duane Hall [facility] 23-Duane-1957 [id number] Ordered brace Patient ID: Duane Hall, male   DOB: 22-Oct-1956, 65 y.o.   MRN: [id number]   Ellouise Newer 03/23/2021, 11:23 AM

## 2021-03-24 DIAGNOSIS — G8918 Other acute postprocedural pain: Secondary | ICD-10-CM

## 2021-03-24 DIAGNOSIS — R0989 Other specified symptoms and signs involving the circulatory and respiratory systems: Secondary | ICD-10-CM

## 2021-03-24 DIAGNOSIS — K5903 Drug induced constipation: Secondary | ICD-10-CM

## 2021-03-24 DIAGNOSIS — D696 Thrombocytopenia, unspecified: Secondary | ICD-10-CM

## 2021-03-24 LAB — GLUCOSE, CAPILLARY
Glucose-Capillary: 211 mg/dL — ABNORMAL HIGH (ref 70–99)
Glucose-Capillary: 155 mg/dL — ABNORMAL HIGH (ref 70–99)
Glucose-Capillary: 250 mg/dL — ABNORMAL HIGH (ref 70–99)
Glucose-Capillary: 138 mg/dL — ABNORMAL HIGH (ref 70–99)

## 2021-03-24 MED ORDER — POLYETHYLENE GLYCOL 3350 17 G PO PACK
17.0000 g | PACK | Freq: Two times a day (BID) | ORAL | Status: DC
  Administered 2021-03-26: 17 g via ORAL
  Filled 2021-03-24 (×4): qty 1

## 2021-03-24 NOTE — Progress Notes (Signed)
PROGRESS NOTE   Subjective/Complaints: Patient seen laying in bed this morning.  Overnight, patient called the police because he was not allowed to independently go to the restroom.  Discussed with nursing and encouraged family involvement, patient appeared to calm down thereafter.  This morning, patient complains of constipation.  He is irritable with limited engagement.  Later notified by nursing regarding hypoglycemia as well.  He was seen by nephrology yesterday, notes reviewed-home HD.  ROS: Denies CP, SOB, N/V/D  Objective:   No results found. Recent Labs    03/23/21 1431  WBC 6.7  HGB 8.3*  HCT 26.4*  PLT 149*   Recent Labs    03/23/21 1431  NA 138  K 4.2  CL 105  CO2 29  GLUCOSE 44*  BUN 18  CREATININE 5.76*  CALCIUM 8.3*    Intake/Output Summary (Last 24 hours) at 03/24/2021 1421 Last data filed at 03/24/2021 0530 Gross per 24 hour  Intake --  Output 501 ml  Net -501 ml     Pressure Injury 06/19/20 Heel Left Unstageable - Full thickness tissue loss in which the base of the injury is covered by slough (yellow, tan, gray, green or brown) and/or eschar (tan, brown or black) in the wound bed. eschar  (Active)  06/19/20 2010  Location: Heel  Location Orientation: Left  Staging: Unstageable - Full thickness tissue loss in which the base of the injury is covered by slough (yellow, tan, gray, green or brown) and/or eschar (tan, brown or black) in the wound bed.  Wound Description (Comments): eschar   Present on Admission: Yes     Pressure Injury 06/19/20 Toe (Comment  which one) Bilateral;Right;Left Stage 2 -  Partial thickness loss of dermis presenting as a shallow open injury with a red, pink wound bed without slough. scab formed on both toes (Active)  06/19/20 2010  Location: Toe (Comment  which one) (great/big toe)  Location Orientation: Bilateral;Right;Left  Staging: Stage 2 -  Partial thickness loss of  dermis presenting as a shallow open injury with a red, pink wound bed without slough.  Wound Description (Comments): scab formed on both toes  Present on Admission: Yes     Pressure Injury 06/19/20 Heel Right Stage 2 -  Partial thickness loss of dermis presenting as a shallow open injury with a red, pink wound bed without slough. skin tear (Active)  06/19/20 2010  Location: Heel  Location Orientation: Right  Staging: Stage 2 -  Partial thickness loss of dermis presenting as a shallow open injury with a red, pink wound bed without slough.  Wound Description (Comments): skin tear  Present on Admission: Yes    Physical Exam: Vital Signs Blood pressure 103/65, pulse 98, temperature 98.9 F (37.2 C), resp. rate 18, height 5\' 9"  (1.753 m), weight 73.3 kg, SpO2 100 %. Constitutional: No distress . Vital signs reviewed. HENT: Normocephalic.  Atraumatic. Eyes: EOMI. No discharge. Cardiovascular: No JVD.  RRR. Respiratory: Normal effort.  No stridor.  Bilateral clear to auscultation. GI: Non-distended.  BS +. Skin: Warm and dry.   Stump with shrinker CDI, patient does not prefer I removed stump shrinker. Psych: Irritable.  Frustrated. Musc: Right BKA  with edema and tenderness. Neuro: Alert Motor: Bilateral upper extremities 4+/5 proximal distal Right lower extremity: Hip flexion knee extension 4/5 Left lower extremity:   Assessment/Plan: 1. Functional deficits which require 3+ hours per day of interdisciplinary therapy in a comprehensive inpatient rehab setting.  Physiatrist is providing close team supervision and 24 hour management of active medical problems listed below.  Physiatrist and rehab team continue to assess barriers to discharge/monitor patient progress toward functional and medical goals  Care Tool:  Bathing    Body parts bathed by patient: Right arm,Left arm,Chest,Abdomen,Right upper leg,Left upper leg,Left lower leg,Front perineal area,Buttocks,Face   Body parts  bathed by helper:  (declined pericare) Body parts n/a: Right lower leg   Bathing assist Assist Level: Independent with assistive device     Upper Body Dressing/Undressing Upper body dressing   What is the patient wearing?: Pull over shirt    Upper body assist Assist Level: Independent with assistive device    Lower Body Dressing/Undressing Lower body dressing      What is the patient wearing?: Pants,Underwear/pull up     Lower body assist Assist for lower body dressing: Independent with assitive device     Toileting Toileting    Toileting assist Assist for toileting: Supervision/Verbal cueing     Transfers Chair/bed transfer  Transfers assist     Chair/bed transfer assist level: Independent with assistive device Chair/bed transfer assistive device:  (WC)   Locomotion Ambulation   Ambulation assist   Ambulation activity did not occur: Safety/medical concerns  Assist level: Supervision/Verbal cueing Assistive device: Walker-rolling Max distance: 40'   Walk 10 feet activity   Assist  Walk 10 feet activity did not occur: Safety/medical concerns  Assist level: Supervision/Verbal cueing Assistive device: Walker-rolling   Walk 50 feet activity   Assist Walk 50 feet with 2 turns activity did not occur: Safety/medical concerns         Walk 150 feet activity   Assist Walk 150 feet activity did not occur: Safety/medical concerns         Walk 10 feet on uneven surface  activity   Assist Walk 10 feet on uneven surfaces activity did not occur: Safety/medical concerns         Wheelchair     Assist Will patient use wheelchair at discharge?: Yes Type of Wheelchair: Manual    Wheelchair assist level: Independent Max wheelchair distance: 300'    Wheelchair 50 feet with 2 turns activity    Assist        Assist Level: Independent   Wheelchair 150 feet activity     Assist  Wheelchair 150 feet activity did not occur:   (fatigue)   Assist Level: Independent   Blood pressure 103/65, pulse 98, temperature 98.9 F (37.2 C), resp. rate 18, height 5\' 9"  (1.753 m), weight 73.3 kg, SpO2 100 %.  Medical Problem List and Plan: 1.Functional and mobility deficitssecondary to gangrene of the right foot leading to a right below-knee amputation  Continue CIR 2. Antithrombotics: -DVT/anticoagulation:Pharmaceutical:Continue Heparin5000 units every 8 hours -antiplatelet therapy: 81 mg of aspirin daily,Plavix 75 mg daily 3. Pain Management:Tylenol for mild pain -Oxycodone for moderate to severe pain--using sparingly -Robaxin as needed for spasms -Gabapentin 100 mg daily for phantom limb pain -desensitization techniques  Appears controlled on 3/26 4. Mood:Team to provide ego support as needed -antipsychotic agents: N/AA  -would benefit from neuropsych eval 5. Neuropsych: This patientiscapable of making decisions on hisown behalf. 6. Skin/Wound Care:    Transition to shrinker  -  mayFloat left heel to prevent breakdown 7. Fluids/Electrolytes/Nutrition: -Patient with low albumin: Continue daily supplements 8.  ESRD on hemodialysis. -Patient on a Monday Wednesday Friday schedule at this point, plan for home HD -Dialysis scheduled after therapy to maximize therapy participation and tolerance 9.Hypotension: Observe blood pressures while on the unit and especially after hemodialysis days  Slightly labile, but asymptomatic on 3/26 10.Acute on chronic anemia: Patient on Aranesp weekly.   Hemoglobin 8.3 on 3/25, labs with HD.  11.Type 2 diabetes.Borderline control at present  Lantus 10 units daily increased to 15u   NovoLog decreased to 3u meal covg, d/ced on 3/26 due to patient's concerns  Labile on 3/26, patient refused long-acting and meal coverage despite fasting glucose of 211 this AM-  attempted to educate patient, believes it is because of the food she ate 12.  Thrombocytopenia  Platelets 149 on 3/25, appears to fluctuate  Labs with HD 13. Drug induced constipation  Bowel meds increased on 3/26  LOS: 8 days A FACE TO FACE EVALUATION WAS PERFORMED  Cailee Blanke Lorie Phenix 03/24/2021, 2:21 PM

## 2021-03-24 NOTE — Progress Notes (Signed)
LPN and charge RN went into pt room to assist pt to the restroom after bed alarm went off.. Pt agitated and did not want staff to be in room while he transferred and went to the restroom. Pt attempted to transfer without locking wheelchair. Once safely in chair with staff supervision, pt got back in his bed because he could not be left in restroom to transfer alone due to safety reasons. Charge nurse explained for safety protocols on Inpatient Rehab to patient. Pt then called 911. Charge nurse called on call Dr. Posey Pronto and notified family.

## 2021-03-24 NOTE — Progress Notes (Signed)
Refused bed alarm; refused insulin and auryxia  Not given patient does not want to eat.

## 2021-03-24 NOTE — Progress Notes (Signed)
Pts B/P taken at 2055 was 89/51. Pt was asymptomatic and refused to have B/P checked. Stated "it's fine". Call bell at beside.

## 2021-03-24 NOTE — Progress Notes (Signed)
Patient concern re: hypoglycemic episode yesterday. Refuse long acting and meal coverage insulin. MD made aware.

## 2021-03-24 NOTE — Progress Notes (Signed)
Patient called to go to bathroom . Nurse answered but he does not want anybody in room while he goes to BR. CN was made aware and talked to patient.

## 2021-03-24 NOTE — Progress Notes (Signed)
Subjective:   Completed dialysis yesterday. Minimal UF.  No new complaints "just another day"   Objective Vital signs in last 24 hours: Vitals:   03/23/21 1830 03/23/21 2055 03/24/21 0500 03/24/21 0545  BP: 137/62 (!) 89/51  103/65  Pulse: 81 91  98  Resp: 16 18  18   Temp: 98.1 F (36.7 C) 98.2 F (36.8 C)  98.9 F (37.2 C)  TempSrc: Oral Oral    SpO2: 98% 100%  100%  Weight:   73.3 kg   Height:        Physical Exam: General:Well appearing, nad  Heart:RRR no MRG Lungs:CTA, nonlabored breathing room air Abdomen:BS positive, soft NTND Extremities:R BKA No LE edema  Dialysis Access:RUA AVF+ bruit  OP HD:[facility] home therapies. 4 HD per week, MTuTh Fri. 76kg edw Hep 3000  Problem/plan  1. Sepsis D/TDiabetic foot wound- MRI + osteo R foot.Status post RBKA 03/14/2021.  Now in CIR 2. Hypokalemia-resolved. Using 3K bath on HD  3. ESRD-Home HD. Plan 3 HD per week while hereon MWF, 3.5 - 4h as needed.Next HD 3/28.  4. Hypotension/ volume:BPstable.no vol excess,minimal UF goals with HD.  5. Anemiaof ESRDHgb8.2 <8.0<8.5, started onDarbepoetin 60 mcg weekly.Tsat 33% on 03/15/21. 6. Metabolic bone disease- Corrected calcium ok. Phos now low --On Auryxia binder. Follow.  Large cola at bedside this am. No VDRA on medication list from [place]. 7. Nutrition- Albumin low2.4 Changed to renal/carb mod diet with fluid restrictions. Addedprosource and renal vits. 8. DMT2-per primary 9. H/O Hep C 10. H/O COPD, tobacco use.  11. H/O PAD onclopidogrel   Lynnda Child PA-C [facility] 03/24/2021,9:40 AM   Labs: Basic Metabolic Panel: Recent Labs  Lab 03/19/21 1439 03/21/21 1334 03/23/21 1431  NA 136 138 138  K 4.1 3.7 4.2  CL 101 102 105  CO2 26 27 29   GLUCOSE 245* 170* 44*  BUN 24* 16 18  CREATININE 7.36* 6.69* 5.76*  CALCIUM 7.7* 7.9* 8.3*  PHOS 5.9* 5.4* 2.7   Liver Function Tests: Recent Labs  Lab  03/19/21 1439 03/21/21 1334 03/23/21 1431  ALBUMIN 2.1* 2.4* 2.5*   No results for input(s): LIPASE, AMYLASE in the last 168 hours. No results for input(s): AMMONIA in the last 168 hours. CBC: Recent Labs  Lab 03/19/21 1439 03/21/21 1334 03/23/21 1431  WBC 5.4 5.1 6.7  HGB 8.0* 8.2* 8.3*  HCT 24.7* 25.8* 26.4*  MCV 97.2 98.1 97.8  PLT 157 147* 149*   Cardiac Enzymes: No results for input(s): CKTOTAL, CKMB, CKMBINDEX, TROPONINI in the last 168 hours. CBG: Recent Labs  Lab 03/23/21 0553 03/23/21 1224 03/23/21 1843 03/23/21 2056 03/24/21 0641  GLUCAP 186* 84 275* 178* 211*    Studies/Results: No results found. Medications: . sodium chloride    . sodium chloride     . acidophilus  2 capsule Oral Daily  . aspirin  81 mg Oral Daily  . Chlorhexidine Gluconate Cloth  6 each Topical Q0600  . clopidogrel  75 mg Oral Daily  . [START ON 03/28/2021] darbepoetin (ARANESP) injection - DIALYSIS  100 mcg Intravenous Q Wed-HD  . feeding supplement  1 Container Oral TID BM  . ferric citrate  210 mg Oral TID WC  . gabapentin  100 mg Oral Daily  . heparin  3,000 Units Dialysis Once in dialysis  . heparin  5,000 Units Subcutaneous Q8H  . insulin aspart  0-15 Units Subcutaneous TID WC  . insulin aspart  0-5 Units Subcutaneous QHS  . insulin  aspart  3 Units Subcutaneous TID WC  . insulin glargine  15 Units Subcutaneous Daily  . multivitamin  1 tablet Oral QHS  . pantoprazole  40 mg Oral Daily  . rosuvastatin  10 mg Oral q morning

## 2021-03-24 NOTE — Progress Notes (Signed)
Patient called to go to [facility] ;he agreed to wear yellow socks . He was able to transfer himself to wheelchair; wheeled himself to [facility] with supervision but will not let anybody in BR ; he closed his bathroom  door. RN remained outside. He transferred himself back to bed. Educated but claims he wants to go home so he can do whatever he was previously doing. Sons in room and educated as well.

## 2021-03-24 NOTE — Progress Notes (Signed)
Refused bed alarm; cooperative and calls when he goes to bathroom.Participated in his dressing change.

## 2021-03-25 LAB — GLUCOSE, CAPILLARY
Glucose-Capillary: 166 mg/dL — ABNORMAL HIGH (ref 70–99)
Glucose-Capillary: 247 mg/dL — ABNORMAL HIGH (ref 70–99)
Glucose-Capillary: 283 mg/dL — ABNORMAL HIGH (ref 70–99)
Glucose-Capillary: 91 mg/dL (ref 70–99)
Glucose-Capillary: 190 mg/dL — ABNORMAL HIGH (ref 70–99)

## 2021-03-25 NOTE — Progress Notes (Addendum)
Patient took a shower. NT outside supervising patient for safety refuses NT to help him. Patient does not want anybody in bathroom. Talked to PT/OT  re: education. RN offered to do dressing change since he went to shower. Patient claims it was changed last night. RN said that I changed it before shift change. Patient said it is ok it did  not get  wet further claims I covered it when I was in the shower. RN offered dressing change later during the day and he was agreeable.

## 2021-03-25 NOTE — Progress Notes (Signed)
Subjective:   Seen in room. No new complaints Says he may go home tomorrow.    Objective Vital signs in last 24 hours: Vitals:   03/24/21 1621 03/24/21 1935 03/25/21 0309 03/25/21 0500  BP: 121/60 121/77 126/63   Pulse: 94 95 97   Resp: 18 18 18    Temp: 98.3 F (36.8 C) 98.8 F (37.1 C) 98.8 F (37.1 C)   TempSrc: Oral Oral Oral   SpO2: 100% 99% 99%   Weight:    73.1 kg  Height:        Physical Exam: General:Well appearing, nad  Heart:RRR no MRG Lungs:CTA, nonlabored breathing room air Abdomen:BS positive, soft NTND Extremities:R BKA No LE edema  Dialysis Access:RUA AVF+ bruit  OP HD:[facility] home therapies. 4 HD per week, MTuTh Fri. 76kg edw Hep 3000  Problem/plan  1. Sepsis D/TDiabetic foot wound- MRI + osteo R foot.Status post RBKA 03/14/2021.  Now in CIR.  2. Hypokalemia-resolved. Using 3K bath on HD  3. ESRD-Home HD. Plan 3 HD per week while hereon MWF, 3.5 - 4h as needed.Next HD 3/28. Update labs.  4. Hypotension/ volume:BPstable.no vol excess,minimal UF goals with HD.  5. Anemiaof ESRDHgb8s . started onDarbepoetin 60 mcg weekly.Tsat 33% on 03/15/21. 6. Metabolic bone disease- Corrected calcium ok. Phos now low --On Auryxia binder. No VDRA on medication list from [place]. 7. Nutrition- Albumin low2.4 Changed to renal/carb mod diet with fluid restrictions. Addedprosource and renal vits. 8. DMT2-per primary 9. H/O Hep C 10. H/O COPD, tobacco use.  11. H/O PAD onclopidogrel   Lynnda Child PA-C [facility] 03/25/2021,12:48 PM   Labs: Basic Metabolic Panel: Recent Labs  Lab 03/19/21 1439 03/21/21 1334 03/23/21 1431  NA 136 138 138  K 4.1 3.7 4.2  CL 101 102 105  CO2 26 27 29   GLUCOSE 245* 170* 44*  BUN 24* 16 18  CREATININE 7.36* 6.69* 5.76*  CALCIUM 7.7* 7.9* 8.3*  PHOS 5.9* 5.4* 2.7   Liver Function Tests: Recent Labs  Lab 03/19/21 1439 03/21/21 1334 03/23/21 1431  ALBUMIN 2.1*  2.4* 2.5*   No results for input(s): LIPASE, AMYLASE in the last 168 hours. No results for input(s): AMMONIA in the last 168 hours. CBC: Recent Labs  Lab 03/19/21 1439 03/21/21 1334 03/23/21 1431  WBC 5.4 5.1 6.7  HGB 8.0* 8.2* 8.3*  HCT 24.7* 25.8* 26.4*  MCV 97.2 98.1 97.8  PLT 157 147* 149*   Cardiac Enzymes: No results for input(s): CKTOTAL, CKMB, CKMBINDEX, TROPONINI in the last 168 hours. CBG: Recent Labs  Lab 03/24/21 1739 03/24/21 2109 03/25/21 0609 03/25/21 1156 03/25/21 1239  GLUCAP 250* 138* 166* 247* 283*    Studies/Results: No results found. Medications: . sodium chloride    . sodium chloride     . acidophilus  2 capsule Oral Daily  . aspirin  81 mg Oral Daily  . Chlorhexidine Gluconate Cloth  6 each Topical Q0600  . clopidogrel  75 mg Oral Daily  . [START ON 03/28/2021] darbepoetin (ARANESP) injection - DIALYSIS  100 mcg Intravenous Q Wed-HD  . feeding supplement  1 Container Oral TID BM  . ferric citrate  210 mg Oral TID WC  . gabapentin  100 mg Oral Daily  . heparin  3,000 Units Dialysis Once in dialysis  . heparin  5,000 Units Subcutaneous Q8H  . insulin aspart  0-15 Units Subcutaneous TID WC  . insulin aspart  0-5 Units Subcutaneous QHS  . insulin glargine  15 Units Subcutaneous  Daily  . multivitamin  1 tablet Oral QHS  . pantoprazole  40 mg Oral Daily  . polyethylene glycol  17 g Oral BID  . rosuvastatin  10 mg Oral q morning

## 2021-03-25 NOTE — Progress Notes (Addendum)
Patient refused long acting insulin claims his blood sugar is not that high for him but ok to get sliding scale; RN educated patient. Refused bed alarm; eating at bedside.

## 2021-03-25 NOTE — Plan of Care (Signed)
  Problem: RH SAFETY Goal: RH STG ADHERE TO SAFETY PRECAUTIONS W/ASSISTANCE/DEVICE Description: STG Adhere to Safety Precautions With supervision Assistance/Device. Outcome: Not Progressing; patient refuses bed alarm; chair alarm; CN aware; per report MD aware; PT made aware; does not want anybody in bathroom. Discussed with sons yesterday.

## 2021-03-25 NOTE — Progress Notes (Signed)
Offered long acting insulin again re: cbg 283. Claims he will take sliding scale but not the long acting. He said Lantus 15 units is too high for him.

## 2021-03-25 NOTE — Progress Notes (Signed)
Dressing done right bka noted some redness on edges of staples; no drainage noted.;  per patient pain is tolerable. Will report to incoming shift. Continue to monitor.

## 2021-03-25 NOTE — Progress Notes (Signed)
Physical Therapy Session Note  Patient Details  Name: Duane Hall MRN: [id number] Date of Birth: October 22, 1956  Today's Date: 03/25/2021 PT Individual Time: 1105-1208 PT Individual Time Calculation (min): 63 min   Short Term Goals: Week 1:  PT Short Term Goal 1 (Week 1): Pt will complete bed<>chair transfers with supervision PT Short Term Goal 2 (Week 1): Pt will propel himself in w/c 141ft with supervision PT Short Term Goal 3 (Week 1): Pt will ambulate 53ft with minA and LRAD PT Short Term Goal 4 (Week 1): Pt will initiate stair training  Skilled Therapeutic Interventions/Progress Updates:    Pt received seated in w/c in room with wife and two sons [name]) present for hands-on family education session. Pt reports no pain this date. Pt is independent for w/c mobility up to 150 ft with use of BUE, independent for management of w/c parts. Per family report pt will be using a sedan for d/c home. Simulated car seat height that pt will d/c home in. Pt is Supervision for lateral scoot transfer w/c to/from car, cues needed for safety and for sequencing. Per family report pt's ramp will not be ready in time for d/c home. Discussed various methods of stair negotiation, decided that safest method for patient is bumping up stairs on his bottom. Pt able to stand to RW from w/c with Supervision then back up to stairs. Pt able to sit down on stairs with use of RW and L handrail with CGA needed for safety when lowering down to sit on step. Pt then able to bump himself up the stairs with Supervision with cues for safe UE placement. Pt then bumped back down stairs. Pt able to stand up to RW from stairs with use of L handrail and family assisting to stabilize RW, CGA for balance. Pt would likely benefit from more assist when performing this type of transfer for safety and to prevent fall, refused assist from therapist. Pt then agreeable to practice getting back up to w/c from the ground to simulate getting  up once at the top of the stairs. Pt agreeable to therapist and family providing physical assist from mat table to floor, min A x 2 for safety and balance when lowering himself from mat table to floor. Once seated on floor discussed safe methods for getting back up to chair. Provided 6" step. Pt able to bump himself up from the ground to 6" step independently. Pt then able to bump up from 6" step to w/c with mod to max A x 2. Pt and family understanding of how to perform car transfer, stair negotiation, and floor to w/c transfer safely. Pt left seated in w/c in room with needs in reach, family present.  Therapy Documentation Precautions:  Precautions Precautions: Fall Precaution Comments: R BKA Restrictions Weight Bearing Restrictions: Yes RLE Weight Bearing: Non weight bearing   Therapy/Group: Individual Therapy   Excell Seltzer, PT, DPT, CSRS  03/25/2021, 12:14 PM

## 2021-03-25 NOTE — Progress Notes (Signed)
Occupational Therapy Session Note  Patient Details  Name: Duane Hall MRN: [id number] Date of Birth: [birth year]  Today's Date: 03/25/2021 OT Individual Time: 1000-1100 OT Individual Time Calculation (min): 60 min   Short Term Goals: Week 1:  OT Short Term Goal 1 (Week 1): Pt will complete BSC/toilet transfer with close S. OT Short Term Goal 2 (Week 1): Pt will complete STS in prep for standing ADL with CGA. OT Short Term Goal 3 (Week 1): Pt will complete toileting with min A. OT Short Term Goal 4 (Week 1): Pt will incorporate at least 2 amputee education points to incorporate into daily routine (skin check, desensitizaiton tech, etc).  Skilled Therapeutic Interventions/Progress Updates:    Pt greeted in the w/c with 3 family members present, including wife. Wife with multiple questions about 3:1 ordered for home, does not want it inside of her shower or placed over toilet and adamant about this. Per wife, she paid "$800" for the seat already in shower. She showed OT a picture of the shower seat at home. We discussed safety benefits of using 3:1 vs seat, especially because pt will not have grab bar support when hopping over to seat inside of shower. Educated wife that it is not safe to use the RW inside of the shower due to wet floor/fall risk. Demonstrated back up transfer method using the simulated ledge while in therapy apartment. Pt adamant that he does not feel safe hopping backwards at this time. He demonstrated the transfer technique he practiced with primary therapist earlier in week, pt with 1 large lateral LOB when transferring out of "shower" space after hopping several times with RW. Pt required Mod A to correct LOB though visibly agitated by assistance. Per pt "Just let go of me. It's a balance issue." Educated spouse to place nonslip treads on shower floor to decrease fall risk with spouse refusing as the floor is "new." Pt reported that he showered independently in the room this AM,  used the open package wrapping of medical supplies to wrap his limb along with medical tape. OT demonstrated to pt and family double-wrapping technique using thick ice bags, also advised using medical + paper tape to improve waterproof seal and ensure dryness of limb in shower. Educated pt that it is not safe to use package wrapping with holes to wrap residual limb, OT provided several ice bags to use at home. Pt minimally receptive to education. He also had the shrinker sock 3/4 way off of his limb. When questioned about this, pt reports that it is too painful to have it fully donned. OT demonstrated how to use the adaptive shrinker sock donner. Pt adamant that he wanted to problem solve its use himself vs have OT assistance initially. With significantly increased time and spouse assistance, pt able to don his shrinker sock. We discussed importance of always having the shrinker sock on for shaping limb in prep for prosthetic. Question carryover of education. Per wife, she wants pt to d/c Wednesday vs tomorrow due to ramp being installed at the later date. Note that pt required cues throughout session to lock his w/c brakes during dynamic seated activity. Pt appeared minimally receptive to this education as well. He remained sitting up in the w/c, anticipating next therapist for family education.   Therapy Documentation Precautions:  Precautions Precautions: Fall Precaution Comments: R BKA Restrictions Weight Bearing Restrictions: Yes RLE Weight Bearing: Non weight bearing Pain: when attempting to don shrinker sock himself. Provided pt with an adaptive  shrinker sock donner to don sock correctly while minimizing pain   ADL: ADL Eating: Independent Where Assessed-Eating: Wheelchair,Bed level Grooming: Independent Where Assessed-Grooming: Sitting at sink,Edge of bed Upper Body Bathing: Modified independent Where Assessed-Upper Body Bathing: Edge of bed Lower Body Bathing: Modified independent Where  Assessed-Lower Body Bathing: Edge of bed Upper Body Dressing: Modified independent (Device) Where Assessed-Upper Body Dressing: Edge of bed Lower Body Dressing: Modified independent Where Assessed-Lower Body Dressing: Wheelchair Toileting: Modified independent Where Assessed-Toileting: Bedside Commode Toilet Transfer: Modified independent Armed forces technical officer Method: Engineer, water: Extra wide bedside commode,Drop arm bedside commode Social research officer, government: Minimal assistance Social research officer, government Method: Education officer, environmental: Radio broadcast assistant      Therapy/Group: Individual Therapy  Merina Behrendt A Damek Ende 03/25/2021, 12:14 PM

## 2021-03-26 DIAGNOSIS — R252 Cramp and spasm: Secondary | ICD-10-CM | POA: Diagnosis not present

## 2021-03-26 DIAGNOSIS — I953 Hypotension of hemodialysis: Secondary | ICD-10-CM | POA: Diagnosis not present

## 2021-03-26 DIAGNOSIS — Z992 Dependence on renal dialysis: Secondary | ICD-10-CM | POA: Diagnosis not present

## 2021-03-26 DIAGNOSIS — N186 End stage renal disease: Secondary | ICD-10-CM | POA: Diagnosis not present

## 2021-03-26 DIAGNOSIS — N2581 Secondary hyperparathyroidism of renal origin: Secondary | ICD-10-CM | POA: Diagnosis not present

## 2021-03-26 LAB — RENAL FUNCTION PANEL
Albumin: 2.2 g/dL — ABNORMAL LOW (ref 3.5–5.0)
Anion gap: 5 (ref 5–15)
BUN: 22 mg/dL (ref 8–23)
CO2: 29 mmol/L (ref 22–32)
Calcium: 8.2 mg/dL — ABNORMAL LOW (ref 8.9–10.3)
Chloride: 104 mmol/L (ref 98–111)
Creatinine, Ser: 6.39 mg/dL — ABNORMAL HIGH (ref 0.61–1.24)
GFR, Estimated: 9 mL/min — ABNORMAL LOW (ref >60)
Glucose, Bld: 176 mg/dL — ABNORMAL HIGH (ref 70–99)
Phosphorus: 3.1 mg/dL (ref 2.5–4.6)
Potassium: 4.8 mmol/L (ref 3.5–5.1)
Sodium: 138 mmol/L (ref 135–145)

## 2021-03-26 LAB — GLUCOSE, CAPILLARY
Glucose-Capillary: 68 mg/dL — ABNORMAL LOW (ref 70–99)
Glucose-Capillary: 72 mg/dL (ref 70–99)
Glucose-Capillary: 174 mg/dL — ABNORMAL HIGH (ref 70–99)
Glucose-Capillary: 171 mg/dL — ABNORMAL HIGH (ref 70–99)

## 2021-03-26 LAB — CBC
HCT: 26.5 % — ABNORMAL LOW (ref 39.0–52.0)
Hemoglobin: 8.3 g/dL — ABNORMAL LOW (ref 13.0–17.0)
MCH: 31.3 pg (ref 26.0–34.0)
MCHC: 31.3 g/dL (ref 30.0–36.0)
MCV: 100 fL (ref 80.0–100.0)
Platelets: 150 10*3/uL (ref 150–400)
RBC: 2.65 MIL/uL — ABNORMAL LOW (ref 4.22–5.81)
RDW: 14.8 % (ref 11.5–15.5)
WBC: 5.4 10*3/uL (ref 4.0–10.5)
nRBC: 0 % (ref 0.0–0.2)

## 2021-03-26 MED ORDER — ACETAMINOPHEN 325 MG PO TABS: 650.0000 mg | ORAL_TABLET | Freq: Four times a day (QID) | ORAL | Status: DC | PRN

## 2021-03-26 MED ORDER — METHOCARBAMOL 500 MG PO TABS: 500.0000 mg | ORAL_TABLET | Freq: Four times a day (QID) | ORAL | 0 refills | Status: DC | PRN

## 2021-03-26 MED ORDER — POLYETHYLENE GLYCOL 3350 17 G PO PACK: 17.0000 g | PACK | Freq: Two times a day (BID) | ORAL | 0 refills | Status: DC

## 2021-03-26 NOTE — Progress Notes (Signed)
Inpatient Rehabilitation Care Coordinator Discharge Note  The overall goal for the admission was met for:   Discharge location: Yes. D/c to home with wife.   Length of Stay: Yes. 9 days.   Discharge activity level: Yes. Mod I/Supervision.  Home/community participation: Yes. Limited.   Services provided included: MD, RD, PT, OT, RN, CM, TR, Pharmacy, Neuropsych and SW  Financial Services: Private Insurance: [employer] offered to/list presented to:Yes  Follow-up services arranged: DME: R amputee pad, 3in1 BSC, RW with [place]; No therapies needed until prosthetic limb-likely to be outpatient.   Comments (or additional information):  Patient/Family verbalized understanding of follow-up arrangements: Yes  Individual responsible for coordination of the follow-up plan: contact pt [phone]  Confirmed correct DME delivered: Rana Snare 03/26/2021    Rana Snare

## 2021-03-26 NOTE — Plan of Care (Signed)
  Problem: RH Balance Goal: LTG: Patient will maintain dynamic sitting balance (OT) Description: LTG:  Patient will maintain dynamic sitting balance with assistance during activities of daily living (OT) Outcome: Completed/Met Goal: LTG Patient will maintain dynamic standing with ADLs (OT) Description: LTG:  Patient will maintain dynamic standing balance with assist during activities of daily living (OT)  Outcome: Completed/Met   Problem: Sit to Stand Goal: LTG:  Patient will perform sit to stand in prep for activites of daily living with assistance level (OT) Description: LTG:  Patient will perform sit to stand in prep for activites of daily living with assistance level (OT) Outcome: Completed/Met   Problem: RH Grooming Goal: LTG Patient will perform grooming w/assist,cues/equip (OT) Description: LTG: Patient will perform grooming with assist, with/without cues using equipment (OT) Outcome: Completed/Met   Problem: RH Bathing Goal: LTG Patient will bathe all body parts with assist levels (OT) Description: LTG: Patient will bathe all body parts with assist levels (OT) Outcome: Completed/Met   Problem: RH Dressing Goal: LTG Patient will perform upper body dressing (OT) Description: LTG Patient will perform upper body dressing with assist, with/without cues (OT). Outcome: Completed/Met Goal: LTG Patient will perform lower body dressing w/assist (OT) Description: LTG: Patient will perform lower body dressing with assist, with/without cues in positioning using equipment (OT) Outcome: Completed/Met   Problem: RH Toileting Goal: LTG Patient will perform toileting task (3/3 steps) with assistance level (OT) Description: LTG: Patient will perform toileting task (3/3 steps) with assistance level (OT)  Outcome: Completed/Met   Problem: RH Simple Meal Prep Goal: LTG Patient will perform simple meal prep w/assist (OT) Description: LTG: Patient will perform simple meal prep with assistance,  with/without cues (OT). Outcome: Completed/Met   Problem: RH Toilet Transfers Goal: LTG Patient will perform toilet transfers w/assist (OT) Description: LTG: Patient will perform toilet transfers with assist, with/without cues using equipment (OT) Outcome: Completed/Met   Problem: RH Tub/Shower Transfers Goal: LTG Patient will perform tub/shower transfers w/assist (OT) Description: LTG: Patient will perform tub/shower transfers with assist, with/without cues using equipment (OT) Outcome: Completed/Met

## 2021-03-26 NOTE — Progress Notes (Addendum)
Subjective: No complaints, states for discharge today.  Thinks he will do dialysis at home.  Objective Vital signs in last 24 hours: Vitals:   03/25/21 1548 03/25/21 1920 03/26/21 0500 03/26/21 0607  BP: 136/64 136/61  (!) 150/68  Pulse: 80 84  81  Resp: 18 16  18   Temp: 98.6 F (37 C) 98.3 F (36.8 C)  98.5 F (36.9 C)  TempSrc:  Oral  Oral  SpO2: 100% 100%  96%  Weight:   73.4 kg   Height:       Weight change: 0.3 kg   Physical Exam: General: Alertmale, , nad  Heart:RRR no MRG Lungs:CTA, nonlabored breathing room air Abdomen:BS positive, soft NTND Extremities:R BKA No LE edema  Dialysis Access:RUA AVF+ bruit  OP HD:[facility] home therapies. 4 HD per week, MTuTh Fri. 76kg edw Hep 3000  Problem/plan  1. Sepsis D/TDiabetic foot wound- MRI + osteo R foot.Status post RBKA 03/14/2021.  Now in CIR.  were discharged today 2. Hypokalemia-resolved.  K4.8 this a.m. recent 3K bath on HD  3. ESRD-Home HD. Plan 3 HD per week while hereon MWF, 3.5 - 4h as needed.Edison Simon he will do dialysis at home today after discharge today 4. Hypotension/ volume:BPstable.no vol excess,minimal UF goals with HD.  Weights postdialysis last 2 treatments 75 kg Wednesday and 76 kg Friday all are bed weights no standing weights thus questionable accuracy last 2 days weight 73.1, 73.4 BP stable, new EDW with BKA possibly 74 kg 5. Anemiaof ESRDHgb8.3 . started onDarbepoetin 60 mcg last given 3/22 plan for increased to 100 MCG.Tsat 33% on 03/15/21. 6. Metabolic bone disease- Corrected calcium ok. Phos 8.1--On Auryxia binder. No VDRA on medication list from [place]. 7. Nutrition- Albumin low2.2 Changed to renal/carb mod diet with fluid restrictions. Addedprosource and renal vits. 8. DMT2-per primary 9. H/O Hep C 10. H/O COPD, tobacco use.  11. H/O PAD onclopidogrel  Ernest Haber, PA-C [facility] [phone] 03/26/2021,11:35 AM  LOS: 10 days    Labs: Basic Metabolic Panel: Recent Labs  Lab 03/21/21 1334 03/23/21 1431 03/26/21 0601  NA 138 138 138  K 3.7 4.2 4.8  CL 102 105 104  CO2 27 29 29   GLUCOSE 170* 44* 176*  BUN 16 18 22   CREATININE 6.69* 5.76* 6.39*  CALCIUM 7.9* 8.3* 8.2*  PHOS 5.4* 2.7 3.1   Liver Function Tests: Recent Labs  Lab 03/21/21 1334 03/23/21 1431 03/26/21 0601  ALBUMIN 2.4* 2.5* 2.2*   No results for input(s): LIPASE, AMYLASE in the last 168 hours. No results for input(s): AMMONIA in the last 168 hours. CBC: Recent Labs  Lab 03/19/21 1439 03/21/21 1334 03/23/21 1431 03/26/21 0601  WBC 5.4 5.1 6.7 5.4  HGB 8.0* 8.2* 8.3* 8.3*  HCT 24.7* 25.8* 26.4* 26.5*  MCV 97.2 98.1 97.8 100.0  PLT 157 147* 149* 150   Cardiac Enzymes: No results for input(s): CKTOTAL, CKMB, CKMBINDEX, TROPONINI in the last 168 hours. CBG: Recent Labs  Lab 03/25/21 1239 03/25/21 1721 03/25/21 2057 03/26/21 0603 03/26/21 1128  GLUCAP 283* 91 190* 174* 171*    Studies/Results: No results found. Medications: . sodium chloride    . sodium chloride     . acidophilus  2 capsule Oral Daily  . aspirin  81 mg Oral Daily  . Chlorhexidine Gluconate Cloth  6 each Topical Q0600  . clopidogrel  75 mg Oral Daily  . [START ON 03/28/2021] darbepoetin (ARANESP) injection - DIALYSIS  100 mcg Intravenous Q Wed-HD  .  feeding supplement  1 Container Oral TID BM  . ferric citrate  210 mg Oral TID WC  . gabapentin  100 mg Oral Daily  . heparin  3,000 Units Dialysis Once in dialysis  . heparin  5,000 Units Subcutaneous Q8H  . insulin aspart  0-15 Units Subcutaneous TID WC  . insulin aspart  0-5 Units Subcutaneous QHS  . insulin glargine  15 Units Subcutaneous Daily  . multivitamin  1 tablet Oral QHS  . pantoprazole  40 mg Oral Daily  . polyethylene glycol  17 g Oral BID  . rosuvastatin  10 mg Oral q morning

## 2021-03-26 NOTE — Progress Notes (Signed)
Physical Therapy Session Note  Patient Details  Name: Duane Hall MRN: [id number] Date of Birth: [birth year]  Today's Date: 03/26/2021 PT Individual Time: 0803-0913 PT Individual Time Calculation (min): 70 min   Short Term Goals: Week 1:  PT Short Term Goal 1 (Week 1): Pt will complete bed<>chair transfers with supervision PT Short Term Goal 2 (Week 1): Pt will propel himself in w/c 133ft with supervision PT Short Term Goal 3 (Week 1): Pt will ambulate 33ft with minA and LRAD PT Short Term Goal 4 (Week 1): Pt will initiate stair training  Skilled Therapeutic Interventions/Progress Updates: Pt presents supine in bed and reluctantly agreeable to therapy.  Pt performs bed mobility and transfers lateral scoot bed > w/c independently.  Pt managed all w/c parts independently w/ increased time but no cueing.  Pt negotiated w/c in room, hallways w/ mod I greater than 300', including ramped surfaces, multiple trials.  Pt transferred w/ independence w/c <> Nu-step.  Pt performed 5' x 2 at Level 5 to increase strength and endurance BUEs and LLE.  Pt performed transfers w/c <> bed and couch w/ independence.  Pt negotiated 4 steps on bottom and required min A for ground to stand at bottom of stairs.  Pt remained in w/c at conclusion of session.  MD in and pt upgraded to mod I in room, OT to sign off.     Therapy Documentation Precautions:  Precautions Precautions: Fall Precaution Comments: R BKA Restrictions Weight Bearing Restrictions: Yes RLE Weight Bearing: Non weight bearing General:   Vital Signs:  Pain:0/10 Pain Assessment Pain Scale: 0-10 Pain Score: 0-No pain    Therapy/Group: Individual Therapy  Ladoris Gene 03/26/2021, 10:08 AM

## 2021-03-26 NOTE — Progress Notes (Signed)
Patient ID: Duane Hall, male   DOB: 12/05/1956, 65 y.o.   MRN: [id number]  SW waiting on updates from all medical team to confirm if pt can discharge today.   SW met with pt in room to inform on above, and reviewed discharge. Pt called his wife while SW in room. Report that the ramp is not complete. Pt intends to bump self up stairs sitting on bottom to get into the home. States there will be support from one of his son's to help get him into the w/c. SW confirms all DME ordered in room. SW informed will follow-up once final updates from team. Pt and wife asked about shell for residual limb, and pt  Asked about shrinker socks. Wife to pick up husband afer 2:30pm. *attneding states shell for residual limb not needed. Socks to be ordered for patient.    SW received updates from nursing that the dialysis machine was not going to be ready until Wednesday. SW left message for pt wife [name] [phone]). SW informed dialysis coord and waiting on updates if there is a delay with starting dialysis machine for home HD today; waiting on updates.   *SW later spoke with pt and pt wife (confernece call) to discuss if dialysis machine is ready to use at home. Wife confirms machine is able to use when he comes home today. Pt will d/c to home today.   Loralee Pacas, MSW, [place] Office: [phone] Cell: [phone] Fax: [phone]

## 2021-03-26 NOTE — Discharge Instructions (Signed)
Inpatient Rehab Discharge Instructions  [facility] Discharge date and time:    Activities/Precautions/ Functional Status: Activity: no lifting, driving, or strenuous exercise for till cleared by MD Diet: renal diet Wound Care: Wash with soap and water. Pat dry and apply dry compressive dressing. Contact Dr. Donzetta Matters if you develop any problems with your incision/wound--redness, swelling, increase in pain, drainage or if you develop fever or chills.    Functional status:  ___ No restrictions     ___ Walk up steps independently _X__ 24/7 supervision/assistance   ___ Walk up steps with assistance ___ Intermittent supervision/assistance  ___ Bathe/dress independently ___ Walk with walker     _X__ Bathe/dress with assistance ___ Walk Independently    ___ Shower independently ___ Walk with assistance    ___ Shower with assistance _X__ No alcohol     ___ Return to work/school ________  COMMUNITY REFERRALS UPON DISCHARGE:    Medical Equipment/Items Ordered: R amputee pad, 3in1 bedside commode, and rolling walker                                                 Agency/Supplier: [employer] [phone]   Special Instructions: No driving smoking or alcohol  Continue dialysis as directed.  Apply dry 4 x 4 gauze wrapped Kerlix and an Ace wrap daily to amputation site   My questions have been answered and I understand these instructions. I will adhere to these goals and the provided educational materials after my discharge from the hospital.  Patient/Caregiver Signature _______________________________ Date __________  Clinician Signature _______________________________________ Date __________  Please bring this form and your medication list with you to all your follow-up doctor's appointments.

## 2021-03-26 NOTE — Progress Notes (Signed)
Occupational Therapy Session Note  Patient Details  Name: Duane Hall MRN: [id number] Date of Birth: July 26, 1956  Today's Date: 03/26/2021 OT Individual Time: 4827-0786 OT Individual Time Calculation (min): 40 min   Short Term Goals: Week 1:  OT Short Term Goal 1 (Week 1): Pt will complete BSC/toilet transfer with close S. OT Short Term Goal 2 (Week 1): Pt will complete STS in prep for standing ADL with CGA. OT Short Term Goal 3 (Week 1): Pt will complete toileting with min A. OT Short Term Goal 4 (Week 1): Pt will incorporate at least 2 amputee education points to incorporate into daily routine (skin check, desensitizaiton tech, etc).  Skilled Therapeutic Interventions/Progress Updates:    Pt greeted semi-reclined in bed and agreeable to OT treatment session. Pt completed bed mobility mod I and donned L shoe mod I. Squat-pivot over to wc mod I. Pt propelled wc to therapy gym and transferred onto therapy mat.,  OT went through home exercise handouts and reviewed that pt needs to complete 3 sets of 10 daily, especially since he will not be getting HH therapies. OT educated on making there-ex a part of his daily routine. Pt verbalized understanding. Pt transferred back to wc and propelled wc back to room. Pt left seated in wc with needs met.   Therapy Documentation Precautions:  Precautions Precautions: Fall Precaution Comments: R BKA Restrictions Weight Bearing Restrictions: Yes RLE Weight Bearing: Non weight bearing Pain: Denies pain  Therapy/Group: Individual Therapy  Valma Cava 03/26/2021, 11:28 AM

## 2021-03-26 NOTE — Progress Notes (Signed)
Physical Therapy Session Note  Patient Details  Name: Duane Hall MRN: [id number] Date of Birth: 1956-06-02  Today's Date: 03/26/2021 PT Individual Time: 0945-1030 PT Individual Time Calculation (min): 45 min   Short Term Goals: Week 1:  PT Short Term Goal 1 (Week 1): Pt will complete bed<>chair transfers with supervision PT Short Term Goal 2 (Week 1): Pt will propel himself in w/c 123ft with supervision PT Short Term Goal 3 (Week 1): Pt will ambulate 59ft with minA and LRAD PT Short Term Goal 4 (Week 1): Pt will initiate stair training      Skilled Therapeutic Interventions/Progress Updates:    pt received in sitting edge of bed and agreeable to therapy. Pt denied pain throughout. PT educated pt on education handouts in room with HEP, positioning, and compression wrapping and pain management techniques. Pt directed in HEP with pt completing all exercises given of sitting knee flexion/extension; supine and sidelying hip extension and prone hip extension, prone stretching, and prone knee flexion x10 each. PT demonstrated and educated on ace wrapping on residual limb with figure 8 technique and provided pt with reference of video demonstration as well. Pt educated on positioning of residual limb and given reference materials for this. Pt reported understanding. Pt denied additional questions or concerns about DC home and reports his wife had come in for family education and his sons will be helping at home as needed. Pt left sitting EOB, All needs in reach and in good condition. Call light in hand.   Therapy Documentation Precautions:  Precautions Precautions: Fall Precaution Comments: R BKA Restrictions Weight Bearing Restrictions: No RLE Weight Bearing: Non weight bearing General:   Vital Signs:  Pain: Pain Assessment Pain Scale: 0-10 Pain Score: 0-No pain Mobility:   Locomotion :    Trunk/Postural Assessment :    Balance:   Exercises:   Other Treatments:       Therapy/Group: Individual Therapy  Junie Panning 03/26/2021, 12:21 PM

## 2021-03-27 ENCOUNTER — Ambulatory Visit: Payer: BC Managed Care – PPO | Admitting: Podiatry

## 2021-03-27 DIAGNOSIS — I953 Hypotension of hemodialysis: Secondary | ICD-10-CM | POA: Diagnosis not present

## 2021-03-27 DIAGNOSIS — N186 End stage renal disease: Secondary | ICD-10-CM | POA: Diagnosis not present

## 2021-03-27 DIAGNOSIS — N2581 Secondary hyperparathyroidism of renal origin: Secondary | ICD-10-CM | POA: Diagnosis not present

## 2021-03-27 DIAGNOSIS — Z992 Dependence on renal dialysis: Secondary | ICD-10-CM | POA: Diagnosis not present

## 2021-03-27 DIAGNOSIS — R252 Cramp and spasm: Secondary | ICD-10-CM | POA: Diagnosis not present

## 2021-03-29 DIAGNOSIS — N186 End stage renal disease: Secondary | ICD-10-CM | POA: Diagnosis not present

## 2021-03-29 DIAGNOSIS — N2581 Secondary hyperparathyroidism of renal origin: Secondary | ICD-10-CM | POA: Diagnosis not present

## 2021-03-29 DIAGNOSIS — R252 Cramp and spasm: Secondary | ICD-10-CM | POA: Diagnosis not present

## 2021-03-29 DIAGNOSIS — I953 Hypotension of hemodialysis: Secondary | ICD-10-CM | POA: Diagnosis not present

## 2021-03-29 DIAGNOSIS — Z992 Dependence on renal dialysis: Secondary | ICD-10-CM | POA: Diagnosis not present

## 2021-03-29 NOTE — Discharge Summary (Signed)
Physician Discharge Summary  Patient ID: ARIQ KHAMIS MRN: [id number] DOB/AGE: 1956/12/11 65 y.o.  Admit date: 03/16/2021 Discharge date: 03/26/2021  Discharge Diagnoses:  Principal Problem:   Gangrene of right foot ([place]) Active Problems:   Diabetes mellitus type 2 in nonobese ([place])   Anemia of chronic disease   ESRD on hemodialysis ([place])   S/P BKA (below knee amputation) unilateral, right ([facility])   Drug induced constipation   Thrombocytopenia ([facility])   Labile blood pressure   Post-operative pain   Discharged Condition: stable   Significant Diagnostic Studies: N/A   Labs:  Basic Metabolic Panel: Recent Labs  Lab 03/23/21 1431 03/26/21 0601  NA 138 138  K 4.2 4.8  CL 105 104  CO2 29 29  GLUCOSE 44* 176*  BUN 18 22  CREATININE 5.76* 6.39*  CALCIUM 8.3* 8.2*  PHOS 2.7 3.1    CBC: Recent Labs  Lab 03/23/21 1431 03/26/21 0601  WBC 6.7 5.4  HGB 8.3* 8.3*  HCT 26.4* 26.5*  MCV 97.8 100.0  PLT 149* 150    CBG: Recent Labs  Lab 03/25/21 1239 03/25/21 1721 03/25/21 2057 03/26/21 0603 03/26/21 1128  GLUCAP 283* 91 190* 174* 171*    Brief HPI:   Duane Hall is a 65 y.o. male with history of ESRD-HD, T2DM, PVD, right toe ulcer with gangrenous changes requiring amputation but progressive changes.  He was admitted on 03/07/2021 with sepsis due to infected ulcer and MRI foot revealed fractures of second and third MT neck.  He was started on antibiotics but attempts at limb salvage were unsuccessful therefore he underwent right BKA on 03/15.  Postop course complicated by issues with stump pain, poor appetite, nausea as well as weakness.  He completed therapy evaluations revealing functional deficits and CIR was recommended for follow-up therapy.   Hospital Course: [facility] was admitted to rehab 03/16/2021 for inpatient therapies to consist of PT and OT at least three hours five days a week. Past admission physiatrist, therapy team and rehab RN have worked  together to provide customized collaborative inpatient rehab.  Blood pressures were monitored on TID basis and has been controlled. Follow up CBC showed H/H is stable and thrombocytopenia has resolved. His diabetes has been monitored with ac/hs CBG checks and SSI was use prn for tighter BS control. Lantus has been adjusted and novolog titrated to help with tighter control. He has refused lantus as well as meal coverage at times despite education on insulin need.  OIC has resolved with adjustment of bowel regimen. HD has been ongoing MWF and anemia of chronic disease has been supplemented with aranesp. Blood pressures have improved and are stable. He has required encouragement for participation, to adhere to safety precautions and has been educated on desensitization techniques.  He is modified independent at wheelchair level and no follow up therapy recommended at this time.    Rehab course: During patient's stay in rehab weekly team conferences were held to monitor patient's progress, set goals and discuss barriers to discharge. At admission, patient required CGA to mod assist with basic self care tasks and min assist with mobility. He  has had improvement in activity tolerance, balance, postural control as well as ability to compensate for deficits. He is able to complete ADL tasks at modified independent to supervision level. He is  modified independent for tranfers and requires supervision to ambulate 26' with RW. Family can assist as needed after discharge.   Discharge disposition: 01-Home or Self Care  Diet: Renal/diabetic diet  Special Instructions: 1. Wash incision with soap and water. Pat dry and apply dry dressing.  Discharge Instructions    Ambulatory referral to Physical Medicine Rehab   Complete by: As directed    Follow up appointment     Allergies as of 03/26/2021      Reactions   Amoxicillin-pot Clavulanate Diarrhea, Other (See Comments)   Midodrine Other (See Comments)   Other  reaction(s): Urinary Sensation   Tape Other (See Comments)   bandaids cause blistering      Medication List    STOP taking these medications   FreeStyle Libre 14 Day Sensor Misc   HYDROmorphone 1 MG/ML injection Commonly known as: DILAUDID   MIRCERA IJ   Oxycodone HCl 10 MG Tabs   Santyl ointment Generic drug: collagenase     TAKE these medications   acetaminophen 325 MG tablet Commonly known as: TYLENOL Take 2 tablets (650 mg total) by mouth every 6 (six) hours as needed for mild pain or headache.   acidophilus Caps capsule Take 2 capsules by mouth daily.   aspirin 81 MG EC tablet Take 1 tablet (81 mg total) by mouth daily. Swallow whole. What changed: when to take this   clopidogrel 75 MG tablet Commonly known as: PLAVIX Take 1 tablet (75 mg total) by mouth daily with breakfast. What changed: when to take this   docusate sodium 100 MG capsule Commonly known as: COLACE Take 100 mg by mouth every morning.   ferric citrate 1 GM 210 MG(Fe) tablet Commonly known as: AURYXIA Take 210 mg by mouth 2 (two) times daily with a meal.   Fiasp 100 UNIT/ML Soln Generic drug: Insulin Aspart (w/Niacinamide) For use in pump, total of 30 units per day   gabapentin 100 MG capsule Commonly known as: NEURONTIN Take 1 capsule (100 mg total) by mouth every morning.   Gvoke HypoPen 1-Pack 1 MG/0.2ML Soaj Generic drug: Glucagon Inject 1 mg into the skin once as needed (low blood sugar).   lidocaine-prilocaine cream Commonly known as: EMLA Apply 1 application topically See admin instructions. Apply topically to port access one hour prior to dialysis on Sunday, Monday, Wednesday, Thursday   loperamide 2 MG capsule Commonly known as: IMODIUM Take 1 capsule (2 mg total) by mouth every 6 (six) hours as needed for diarrhea or loose stools.   methocarbamol 500 MG tablet Commonly known as: ROBAXIN Take 1 tablet (500 mg total) by mouth every 6 (six) hours as needed for muscle  spasms.   multivitamin Tabs tablet Take 1 tablet by mouth every morning.   OmniPod Dash 5 Pack Pods Misc Use as instructed: use pod every 3 days.   pantoprazole 40 MG tablet Commonly known as: PROTONIX Take 1 tablet (40 mg total) by mouth daily.   Pen Needles 31G X 6 MM Misc 1 Device by Does not apply route in the morning, at noon, in the evening, and at bedtime.   polyethylene glycol 17 g packet Commonly known as: MIRALAX / GLYCOLAX Take 17 g by mouth 2 (two) times daily.   rosuvastatin 10 MG tablet Commonly known as: CRESTOR TAKE 1 TABLET BY MOUTH DAILY What changed: when to take this       Follow-up Information    Meredith Staggers, MD Follow up.   Specialty: Physical Medicine and Rehabilitation Why: office will call you with follow up appt Contact information: [address] [place] [address] [phone]  Pieter Partridge, PA. Call.   Specialty: Family Medicine Why: for post hospital follow up Contact information: [address] [place] [address] [phone]        Waynetta Sandy, MD. Call.   Specialties: Vascular Surgery, Cardiology Why: for post op appointment Contact information: [place] [address] [phone]               Signed: Bary Leriche 03/29/2021, 5:17 PM

## 2021-03-30 DIAGNOSIS — R252 Cramp and spasm: Secondary | ICD-10-CM | POA: Diagnosis not present

## 2021-03-30 DIAGNOSIS — Z992 Dependence on renal dialysis: Secondary | ICD-10-CM | POA: Diagnosis not present

## 2021-03-30 DIAGNOSIS — N2581 Secondary hyperparathyroidism of renal origin: Secondary | ICD-10-CM | POA: Diagnosis not present

## 2021-03-30 DIAGNOSIS — N186 End stage renal disease: Secondary | ICD-10-CM | POA: Diagnosis not present

## 2021-03-30 DIAGNOSIS — I953 Hypotension of hemodialysis: Secondary | ICD-10-CM | POA: Diagnosis not present

## 2021-04-01 DIAGNOSIS — N186 End stage renal disease: Secondary | ICD-10-CM | POA: Diagnosis not present

## 2021-04-01 DIAGNOSIS — N2581 Secondary hyperparathyroidism of renal origin: Secondary | ICD-10-CM | POA: Diagnosis not present

## 2021-04-01 DIAGNOSIS — Z992 Dependence on renal dialysis: Secondary | ICD-10-CM | POA: Diagnosis not present

## 2021-04-01 DIAGNOSIS — I953 Hypotension of hemodialysis: Secondary | ICD-10-CM | POA: Diagnosis not present

## 2021-04-01 DIAGNOSIS — R252 Cramp and spasm: Secondary | ICD-10-CM | POA: Diagnosis not present

## 2021-04-02 DIAGNOSIS — N186 End stage renal disease: Secondary | ICD-10-CM | POA: Diagnosis not present

## 2021-04-02 DIAGNOSIS — Z992 Dependence on renal dialysis: Secondary | ICD-10-CM | POA: Diagnosis not present

## 2021-04-02 DIAGNOSIS — N2581 Secondary hyperparathyroidism of renal origin: Secondary | ICD-10-CM | POA: Diagnosis not present

## 2021-04-02 DIAGNOSIS — I953 Hypotension of hemodialysis: Secondary | ICD-10-CM | POA: Diagnosis not present

## 2021-04-02 DIAGNOSIS — R252 Cramp and spasm: Secondary | ICD-10-CM | POA: Diagnosis not present

## 2021-04-04 ENCOUNTER — Telehealth: Payer: Self-pay | Admitting: Endocrinology

## 2021-04-04 DIAGNOSIS — I953 Hypotension of hemodialysis: Secondary | ICD-10-CM | POA: Diagnosis not present

## 2021-04-04 DIAGNOSIS — Z992 Dependence on renal dialysis: Secondary | ICD-10-CM | POA: Diagnosis not present

## 2021-04-04 DIAGNOSIS — N186 End stage renal disease: Secondary | ICD-10-CM | POA: Diagnosis not present

## 2021-04-04 DIAGNOSIS — R252 Cramp and spasm: Secondary | ICD-10-CM | POA: Diagnosis not present

## 2021-04-04 DIAGNOSIS — N2581 Secondary hyperparathyroidism of renal origin: Secondary | ICD-10-CM | POA: Diagnosis not present

## 2021-04-04 NOTE — Telephone Encounter (Signed)
New message; Pt calling regarding C/O BS between 40-80 dropping and needing to know the dosing on insulin pt recently had recently had leg below the knee amputated.

## 2021-04-04 NOTE — Telephone Encounter (Signed)
Please Advise

## 2021-04-05 ENCOUNTER — Telehealth: Payer: Self-pay

## 2021-04-05 DIAGNOSIS — Z992 Dependence on renal dialysis: Secondary | ICD-10-CM | POA: Diagnosis not present

## 2021-04-05 DIAGNOSIS — I953 Hypotension of hemodialysis: Secondary | ICD-10-CM | POA: Diagnosis not present

## 2021-04-05 DIAGNOSIS — R252 Cramp and spasm: Secondary | ICD-10-CM | POA: Diagnosis not present

## 2021-04-05 DIAGNOSIS — N186 End stage renal disease: Secondary | ICD-10-CM | POA: Diagnosis not present

## 2021-04-05 DIAGNOSIS — N2581 Secondary hyperparathyroidism of renal origin: Secondary | ICD-10-CM | POA: Diagnosis not present

## 2021-04-05 NOTE — Telephone Encounter (Signed)
Message sent thru MyChart 

## 2021-04-05 NOTE — Telephone Encounter (Signed)
SW spoke with pt and [name] to discuss DME- slide board ordered with [facility] and approved by Dr. Naaman Plummer. They have contact information for DME company as previous hx with company.

## 2021-04-05 NOTE — Telephone Encounter (Signed)
F/u     Patient calling back to speak with a nurse from yesterday message.  Please advise

## 2021-04-05 NOTE — Telephone Encounter (Signed)
Please confirm pump settings and what time of day it is going low

## 2021-04-08 DIAGNOSIS — Z992 Dependence on renal dialysis: Secondary | ICD-10-CM | POA: Diagnosis not present

## 2021-04-08 DIAGNOSIS — I953 Hypotension of hemodialysis: Secondary | ICD-10-CM | POA: Diagnosis not present

## 2021-04-08 DIAGNOSIS — N2581 Secondary hyperparathyroidism of renal origin: Secondary | ICD-10-CM | POA: Diagnosis not present

## 2021-04-08 DIAGNOSIS — N186 End stage renal disease: Secondary | ICD-10-CM | POA: Diagnosis not present

## 2021-04-08 DIAGNOSIS — R252 Cramp and spasm: Secondary | ICD-10-CM | POA: Diagnosis not present

## 2021-04-09 DIAGNOSIS — N186 End stage renal disease: Secondary | ICD-10-CM | POA: Diagnosis not present

## 2021-04-09 DIAGNOSIS — Z992 Dependence on renal dialysis: Secondary | ICD-10-CM | POA: Diagnosis not present

## 2021-04-09 DIAGNOSIS — I953 Hypotension of hemodialysis: Secondary | ICD-10-CM | POA: Diagnosis not present

## 2021-04-09 DIAGNOSIS — N2581 Secondary hyperparathyroidism of renal origin: Secondary | ICD-10-CM | POA: Diagnosis not present

## 2021-04-09 DIAGNOSIS — R252 Cramp and spasm: Secondary | ICD-10-CM | POA: Diagnosis not present

## 2021-04-11 ENCOUNTER — Inpatient Hospital Stay: Payer: BC Managed Care – PPO | Admitting: Physical Medicine & Rehabilitation

## 2021-04-11 DIAGNOSIS — N2581 Secondary hyperparathyroidism of renal origin: Secondary | ICD-10-CM | POA: Diagnosis not present

## 2021-04-11 DIAGNOSIS — R252 Cramp and spasm: Secondary | ICD-10-CM | POA: Diagnosis not present

## 2021-04-11 DIAGNOSIS — I953 Hypotension of hemodialysis: Secondary | ICD-10-CM | POA: Diagnosis not present

## 2021-04-11 DIAGNOSIS — Z992 Dependence on renal dialysis: Secondary | ICD-10-CM | POA: Diagnosis not present

## 2021-04-11 DIAGNOSIS — N186 End stage renal disease: Secondary | ICD-10-CM | POA: Diagnosis not present

## 2021-04-12 DIAGNOSIS — R252 Cramp and spasm: Secondary | ICD-10-CM | POA: Diagnosis not present

## 2021-04-12 DIAGNOSIS — I953 Hypotension of hemodialysis: Secondary | ICD-10-CM | POA: Diagnosis not present

## 2021-04-12 DIAGNOSIS — Z992 Dependence on renal dialysis: Secondary | ICD-10-CM | POA: Diagnosis not present

## 2021-04-12 DIAGNOSIS — N186 End stage renal disease: Secondary | ICD-10-CM | POA: Diagnosis not present

## 2021-04-12 DIAGNOSIS — N2581 Secondary hyperparathyroidism of renal origin: Secondary | ICD-10-CM | POA: Diagnosis not present

## 2021-04-16 DIAGNOSIS — N186 End stage renal disease: Secondary | ICD-10-CM | POA: Diagnosis not present

## 2021-04-16 DIAGNOSIS — I953 Hypotension of hemodialysis: Secondary | ICD-10-CM | POA: Diagnosis not present

## 2021-04-16 DIAGNOSIS — N2581 Secondary hyperparathyroidism of renal origin: Secondary | ICD-10-CM | POA: Diagnosis not present

## 2021-04-16 DIAGNOSIS — Z992 Dependence on renal dialysis: Secondary | ICD-10-CM | POA: Diagnosis not present

## 2021-04-16 DIAGNOSIS — R252 Cramp and spasm: Secondary | ICD-10-CM | POA: Diagnosis not present

## 2021-04-17 ENCOUNTER — Ambulatory Visit (INDEPENDENT_AMBULATORY_CARE_PROVIDER_SITE_OTHER): Payer: BC Managed Care – PPO | Admitting: Physician Assistant

## 2021-04-17 ENCOUNTER — Encounter: Payer: Self-pay | Admitting: Physician Assistant

## 2021-04-17 ENCOUNTER — Encounter: Payer: BC Managed Care – PPO | Attending: Registered Nurse | Admitting: Registered Nurse

## 2021-04-17 ENCOUNTER — Other Ambulatory Visit: Payer: Self-pay

## 2021-04-17 ENCOUNTER — Encounter: Payer: Self-pay | Admitting: Registered Nurse

## 2021-04-17 VITALS — BP 120/69 | HR 85 | Temp 98.4°F | Ht 69.0 in | Wt 159.0 lb

## 2021-04-17 VITALS — BP 153/76 | HR 88 | Temp 98.7°F | Resp 20 | Ht 69.0 in

## 2021-04-17 DIAGNOSIS — I739 Peripheral vascular disease, unspecified: Secondary | ICD-10-CM

## 2021-04-17 DIAGNOSIS — Z992 Dependence on renal dialysis: Secondary | ICD-10-CM | POA: Diagnosis not present

## 2021-04-17 DIAGNOSIS — E119 Type 2 diabetes mellitus without complications: Secondary | ICD-10-CM | POA: Diagnosis not present

## 2021-04-17 DIAGNOSIS — Z89511 Acquired absence of right leg below knee: Secondary | ICD-10-CM

## 2021-04-17 DIAGNOSIS — N186 End stage renal disease: Secondary | ICD-10-CM | POA: Insufficient documentation

## 2021-04-17 NOTE — Progress Notes (Signed)
Subjective:    Patient ID: Duane Hall, male    DOB: Jul 26, 1956, 65 y.o.   MRN: [id number]  HPI: Duane Hall is a 65 y.o. male who is here for Transitional Care Visit for follow up of his S/P Right BKA, Diabetes Mellitus Type 2 in non-obese and ESRD on hemodialysis. He was sent to [facility] ED via his podiatrist on  03/07/2021, he was admitted with sepsis with t infection of the ulcer. Vascular Consulted. Limb Salvage was unsuccessful, he underwent RIGHT BELOW KNEE AMPUTATION on 03/13/2021, by Dr Donzetta Matters.  DG Foot Right:  IMPRESSION: Prior surgical changes with soft tissue wound.  No findings to suggest osteomyelitis are seen.  Fractures of the distal aspect of the second and third metatarsals are seen new from the prior exam.  MR Heel Right WO Contrast: MR Right Foot IMPRESSION: 1. Large soft tissue ulceration at the great toe amputation site with small amount of subcutaneous emphysema. Subtle erosion of the dorsal aspect of the residual first metatarsal base with small amount of adjacent fluid, concerning for early osteomyelitis. No abscess. 2. Subacute mildly impacted fractures of the second and third metatarsal necks. 3. Diffuse patchy, mostly periarticular marrow edema involving the proximal phalanges, metatarsals, and tarsal bones. Suspect neuropathic arthropathy.  MR Right Foot WO Contrast:   Mr. Farra was admitted to inpatient rehabilitation on 03/16/2021 and discharged home on 03/26/2021. He denies pain at this time, states he has occasionally has  phantom pain in his Right Stump. He rated his pain 1. Also reports his appetite is fair.  Mr. Azevedo was seen by Karoline Caldwell PA-C  today and had his right stump staples removed. Mr. Schifano refused dressing changed by this provider due to the above, dressing intact.   He arrived in wheelchair.   Wife in room.   Pain Inventory Average Pain 1 Pain Right Now 1 My pain is intermittent and fantom pain in right  stump  LOCATION OF PAIN  Right stump BOWEL Number of stools per week: 2 Oral laxative use No  Type of laxative [employer] Brand Enema or suppository use No  History of colostomy No  Incontinent No   BLADDER Dialysis In and out cath, frequency n/a Able to self cath No  Bladder incontinence No  Frequent urination No  Leakage with coughing No  Difficulty starting stream No  Incomplete bladder emptying No    Mobility use a walker ability to climb steps?  no do you drive?  no use a wheelchair transfers alone Do you have any goals in this area?  yes  Function retired I need assistance with the following:  household duties and shopping Do you have any goals in this area?  yes  Neuro/Psych weakness  Prior Studies Any changes since last visit?  no New Patient  Physicians involved in your care Any changes since last visit?  no New Patient   Family History  Problem Relation Age of Onset  . Cancer Father   . Diabetes Mother    Social History   Socioeconomic History  . Marital status: Married    Spouse name: Not on file  . Number of children: Not on file  . Years of education: Not on file  . Highest education level: Not on file  Occupational History  . Occupation: Chief Financial Officer  Tobacco Use  . Smoking status: Current Every Day Smoker    Packs/day: 0.30    Types: Cigarettes  . Smokeless tobacco: Never Used  Vaping Use  .  Vaping Use: Never used  Substance and Sexual Activity  . Alcohol use: No    Alcohol/week: 0.0 standard drinks  . Drug use: No  . Sexual activity: Not on file  Other Topics Concern  . Not on file  Social History Narrative   Married.    Social Determinants of Health   Financial Resource Strain: Not on file  Food Insecurity: Not on file  Transportation Needs: Not on file  Physical Activity: Not on file  Stress: Not on file  Social Connections: Not on file   Past Surgical History:  Procedure Laterality Date  . ABDOMINAL AORTOGRAM W/LOWER  EXTREMITY N/A 06/19/2020   Procedure: ABDOMINAL AORTOGRAM W/LOWER EXTREMITY;  Surgeon: Waynetta Sandy, MD;  Location: [facility];  Service: Cardiovascular;  Laterality: N/A;  . ABDOMINAL AORTOGRAM W/LOWER EXTREMITY Left 10/16/2020   Procedure: ABDOMINAL AORTOGRAM W/LOWER EXTREMITY;  Surgeon: Waynetta Sandy, MD;  Location: [facility];  Service: Cardiovascular;  Laterality: Left;  . ABDOMINAL AORTOGRAM W/LOWER EXTREMITY N/A 12/25/2020   Procedure: ABDOMINAL AORTOGRAM W/LOWER EXTREMITY;  Surgeon: Waynetta Sandy, MD;  Location: [facility];  Service: Cardiovascular;  Laterality: N/A;  . AMPUTATION Right 01/12/2021   Procedure: 1ST AMPUTATION RAY;  Surgeon: [name], DPM;  Location: [facility];  Service: Podiatry;  Laterality: Right;  . AMPUTATION Right 03/13/2021   Procedure: RIGHT BELOW KNEE AMPUTATION;  Surgeon: Waynetta Sandy, MD;  Location: [facility];  Service: Vascular;  Laterality: Right;  . AV FISTULA PLACEMENT Right 09/14/2019   Procedure: Right Arm Basilic Vein transposition;  Surgeon: Angelia Mould, MD;  Location: [facility];  Service: Vascular;  Laterality: Right;  . BONE BIOPSY Left 07/29/2020   Procedure: BONE BIOPSY;  Surgeon: [name], DPM;  Location: [facility];  Service: Podiatry;  Laterality: Left;  Need bone trephines and/or large bore Giamshidi  . COLONOSCOPY    . PERIPHERAL VASCULAR ATHERECTOMY Left 06/19/2020   Procedure: PERIPHERAL VASCULAR ATHERECTOMY;  Surgeon: Waynetta Sandy, MD;  Location: [facility];  Service: Cardiovascular;  Laterality: Left;  SFA  . PERIPHERAL VASCULAR ATHERECTOMY  12/25/2020   Procedure: PERIPHERAL VASCULAR ATHERECTOMY;  Surgeon: Waynetta Sandy, MD;  Location: [facility];  Service: Cardiovascular;;  Lt. PT - Laser Lt. SFA - Laser  . PERIPHERAL VASCULAR BALLOON ANGIOPLASTY  12/25/2020   Procedure: PERIPHERAL VASCULAR BALLOON ANGIOPLASTY;  Surgeon:  Waynetta Sandy, MD;  Location: [facility];  Service: Cardiovascular;;  Lt. SFA and PT  . UPPER GASTROINTESTINAL ENDOSCOPY  02/2019   Dr Benson Norway     Past Medical History:  Diagnosis Date  . Asthma   . Cancer ([place])   . Cataract   . CKD (chronic kidney disease), stage III ([facility])   . Diabetes mellitus   . Glaucoma   . Vascular insufficiency 05/2020   There were no vitals taken for this visit.  Opioid Risk Score:   Fall Risk Score:  `1  Depression screen PHQ 2/9  Depression screen [facility] 2/9 10/10/2020 03/24/2019 03/24/2019 11/08/2015 09/12/2014  Decreased Interest 0 0 0 0 0  Down, Depressed, Hopeless 0 0 0 0 0  PHQ - 2 Score 0 0 0 0 0   Review of Systems  Constitutional: Positive for appetite change.  Endocrine:       Low & high glucose  Skin: Positive for wound.       phantom pain right stump  Hematological: Bruises/bleeds easily.  All other  systems reviewed and are negative.      Objective:   Physical Exam Vitals and nursing note reviewed.  Constitutional:      Appearance: Normal appearance.  Cardiovascular:     Rate and Rhythm: Normal rate and regular rhythm.     Pulses: Normal pulses.     Heart sounds: Normal heart sounds.  Pulmonary:     Effort: Pulmonary effort is normal.     Breath sounds: Normal breath sounds.  Musculoskeletal:     Cervical back: Neck supple.     Comments: Normal Muscle Bulk and Muscle Testing Reveals:  Upper Extremities: Full ROM and Muscle Strength 5/5  Lower Extremities Right BKA: Dressing Intact Left: Full ROM and Muscle Strength 5/5 Arrived in wheelchair   Skin:    General: Skin is warm and dry.  Neurological:     Mental Status: He is alert and oriented to person, place, and time.  Psychiatric:        Mood and Affect: Mood normal.        Behavior: Behavior normal.           Assessment & Plan:  1. S/P Right BKA: Vascular Following. Continue to monitor.  2. Diabetes Mellitus Type 2 in non-obese: Continue current  medication regimen. PCP Following.  3. ESRD on hemodialysis. On Home Hemodialysis. Nephrology Following.   F/U with Dr Naaman Plummer in 4- 6 weeks

## 2021-04-17 NOTE — Progress Notes (Signed)
Office Note     CC:  follow up Requesting Provider:  Pieter Partridge, PA  HPI: Duane Hall is a 65 y.o. (26-Jun-1956) male who presents for follow up after recent BKA. He has history of  revascularization of bilateral lower extremities for bilateral foot wounds. In follow up the left heel wound was improving but his right 1st toe gangrene worsened requiring right 1st toe amputation by podiatry on 01/12/21. This unfortunately was not showing signs of healing at follow up in February so he subsequently had right BKA by Dr. Donzetta Matters on 03/13/21.  He returns today for follow up and staple removal. Overall doing well. Reports intermittent phantom pains and some positioning discomfort when he is sleeping but otherwise overall pain manageable. Has had some bloody oozing intermittently from staple line wife says since he was discharged. He additionally fell and hit right BKA last week so had some increased bleeding since then. He is seeing PT this afternoon. He reports no issues with LLE  The pt is on a statin for cholesterol management.  The pt is on a daily aspirin.   Other AC: Plavix The pt is not on medication for hypertension.   The pt is diabetic.  Tobacco hx:  Current smoker  Past Medical History:  Diagnosis Date  . Asthma   . Cancer ([place])   . Cataract   . CKD (chronic kidney disease), stage III ([place])   . Diabetes mellitus   . Glaucoma   . Vascular insufficiency 05/2020    Past Surgical History:  Procedure Laterality Date  . ABDOMINAL AORTOGRAM W/LOWER EXTREMITY N/A 06/19/2020   Procedure: ABDOMINAL AORTOGRAM W/LOWER EXTREMITY;  Surgeon: Waynetta Sandy, MD;  Location: [facility];  Service: Cardiovascular;  Laterality: N/A;  . ABDOMINAL AORTOGRAM W/LOWER EXTREMITY Left 10/16/2020   Procedure: ABDOMINAL AORTOGRAM W/LOWER EXTREMITY;  Surgeon: Waynetta Sandy, MD;  Location: [facility];  Service: Cardiovascular;  Laterality: Left;  . ABDOMINAL AORTOGRAM  W/LOWER EXTREMITY N/A 12/25/2020   Procedure: ABDOMINAL AORTOGRAM W/LOWER EXTREMITY;  Surgeon: Waynetta Sandy, MD;  Location: [facility];  Service: Cardiovascular;  Laterality: N/A;  . AMPUTATION Right 01/12/2021   Procedure: 1ST AMPUTATION RAY;  Surgeon: [name], DPM;  Location: [facility];  Service: Podiatry;  Laterality: Right;  . AMPUTATION Right 03/13/2021   Procedure: RIGHT BELOW KNEE AMPUTATION;  Surgeon: Waynetta Sandy, MD;  Location: [facility];  Service: Vascular;  Laterality: Right;  . AV FISTULA PLACEMENT Right 09/14/2019   Procedure: Right Arm Basilic Vein transposition;  Surgeon: Angelia Mould, MD;  Location: [facility];  Service: Vascular;  Laterality: Right;  . BONE BIOPSY Left 07/29/2020   Procedure: BONE BIOPSY;  Surgeon: [name], DPM;  Location: [facility];  Service: Podiatry;  Laterality: Left;  Need bone trephines and/or large bore Giamshidi  . COLONOSCOPY    . PERIPHERAL VASCULAR ATHERECTOMY Left 06/19/2020   Procedure: PERIPHERAL VASCULAR ATHERECTOMY;  Surgeon: Waynetta Sandy, MD;  Location: [facility];  Service: Cardiovascular;  Laterality: Left;  SFA  . PERIPHERAL VASCULAR ATHERECTOMY  12/25/2020   Procedure: PERIPHERAL VASCULAR ATHERECTOMY;  Surgeon: Waynetta Sandy, MD;  Location: [facility];  Service: Cardiovascular;;  Lt. PT - Laser Lt. SFA - Laser  . PERIPHERAL VASCULAR BALLOON ANGIOPLASTY  12/25/2020   Procedure: PERIPHERAL VASCULAR BALLOON ANGIOPLASTY;  Surgeon: Waynetta Sandy, MD;  Location: [facility];  Service: Cardiovascular;;  Lt. SFA and PT  .  UPPER GASTROINTESTINAL ENDOSCOPY  02/2019   Dr Benson Norway      Social History   Socioeconomic History  . Marital status: Married    Spouse name: Not on file  . Number of children: Not on file  . Years of education: Not on file  . Highest education level: Not on file  Occupational History  . Occupation: Chief Financial Officer  Tobacco Use  .  Smoking status: Current Every Day Smoker    Packs/day: 0.30    Types: Cigarettes  . Smokeless tobacco: Never Used  Vaping Use  . Vaping Use: Never used  Substance and Sexual Activity  . Alcohol use: No    Alcohol/week: 0.0 standard drinks  . Drug use: No  . Sexual activity: Not on file  Other Topics Concern  . Not on file  Social History Narrative   Married.    Social Determinants of Health   Financial Resource Strain: Not on file  Food Insecurity: Not on file  Transportation Needs: Not on file  Physical Activity: Not on file  Stress: Not on file  Social Connections: Not on file  Intimate Partner Violence: Not on file    Family History  Problem Relation Age of Onset  . Cancer Father   . Diabetes Mother     Current Outpatient Medications  Medication Sig Dispense Refill  . acidophilus (RISAQUAD) CAPS capsule Take 2 capsules by mouth daily.    Marland Kitchen aspirin EC 81 MG EC tablet Take 1 tablet (81 mg total) by mouth daily. Swallow whole. (Patient taking differently: Take 81 mg by mouth every morning. Swallow whole.) 30 tablet 11  . clopidogrel (PLAVIX) 75 MG tablet Take 1 tablet (75 mg total) by mouth daily with breakfast. (Patient taking differently: Take 75 mg by mouth every morning.) 30 tablet 3  . docusate sodium (COLACE) 100 MG capsule Take 100 mg by mouth every morning.    . ferric citrate (AURYXIA) 1 GM 210 MG(Fe) tablet Take 210 mg by mouth 2 (two) times daily with a meal.    . Glucagon (GVOKE HYPOPEN 1-PACK) 1 MG/0.2ML SOAJ Inject 1 mg into the skin once as needed (low blood sugar).    . Insulin Aspart, w/Niacinamide, (FIASP) 100 UNIT/ML SOLN For use in pump, total of 30 units per day 90 mL 0  . Insulin Disposable Pump (OMNIPOD DASH 5 PACK PODS) MISC Use as instructed: use pod every 3 days. 30 each 3  . lidocaine-prilocaine (EMLA) cream Apply 1 application topically See admin instructions. Apply topically to port access one hour prior to dialysis on Sunday, Monday, Wednesday,  Thursday    . multivitamin (RENA-VIT) TABS tablet Take 1 tablet by mouth every morning.    . pantoprazole (PROTONIX) 40 MG tablet Take 1 tablet (40 mg total) by mouth daily.    . rosuvastatin (CRESTOR) 10 MG tablet TAKE 1 TABLET BY MOUTH DAILY (Patient taking differently: Take 10 mg by mouth every morning.) 30 tablet 11   No current facility-administered medications for this visit.    Allergies  Allergen Reactions  . Amoxicillin-Pot Clavulanate Diarrhea and Other (See Comments)  . Midodrine Other (See Comments)    Other reaction(s): Urinary Sensation  . Tape Other (See Comments)    bandaids cause blistering     REVIEW OF SYSTEMS:  [X]  denotes positive finding, [ ]  denotes negative finding Cardiac  Comments:  Chest pain or chest pressure:    Shortness of breath upon exertion:    Short of breath when lying flat:  Irregular heart rhythm:        Vascular    Pain in calf, thigh, or hip brought on by ambulation:    Pain in feet at night that wakes you up from your sleep:     Blood clot in your veins:    Leg swelling:         Pulmonary    Oxygen at home:    Productive cough:     Wheezing:         Neurologic    Sudden weakness in arms or legs:     Sudden numbness in arms or legs:     Sudden onset of difficulty speaking or slurred speech:    Temporary loss of vision in one eye:     Problems with dizziness:         Gastrointestinal    Blood in stool:     Vomited blood:         Genitourinary    Burning when urinating:     Blood in urine:        Psychiatric    Major depression:         Hematologic    Bleeding problems:    Problems with blood clotting too easily:        Skin    Rashes or ulcers:        Constitutional    Fever or chills:      PHYSICAL EXAMINATION:  Vitals:   04/17/21 1324  BP: (!) 153/76  Pulse: 88  Resp: 20  Temp: 98.7 F (37.1 C)  TempSrc: Temporal  SpO2: 100%  Height: 5\' 9"  (1.753 m)    General:  WDWN in NAD; vital signs  documented above Gait: Not observed, in wheel chair HENT: WNL, normocephalic Pulmonary: normal non-labored breathing Cardiac: regular HR, without  Murmurs with carotid bruit Vascular Exam/Pulses: right KA staples intact, well approximated, flaps viable. Some bright red oozing bleeding. Several areas on mediolateral aspect of staple line where there is some superficial abrasion of the skin from where he had local trauma. Skin intact. Staples removed and steri strips applied to this area Musculoskeletal: no muscle wasting or atrophy  Neurologic: alert and oriented   ASSESSMENT/PLAN:: 65 y.o. male here for follow up for right BKA on 03/12/21 by Dr. Donzetta Matters. The right BKA is looking good. He has had some oozing secondary to blood thinners as well as had recent trauma from fall to BKA. Otherwise pain is well managed. I removed staples today. There is a few areas from where patient fell and hit BKA stump where the skin has some abrasion. I applied steri strips along incision in these areas  - He will need referral to [facility] at next visit - I will have him follow up in 2-3 weeks just for another wound check to make sure BKA is adequately healing   Karoline Caldwell, PA-C [facility] [phone]  Clinic MD:  Stanford Breed

## 2021-04-18 DIAGNOSIS — I953 Hypotension of hemodialysis: Secondary | ICD-10-CM | POA: Diagnosis not present

## 2021-04-18 DIAGNOSIS — R252 Cramp and spasm: Secondary | ICD-10-CM | POA: Diagnosis not present

## 2021-04-18 DIAGNOSIS — Z992 Dependence on renal dialysis: Secondary | ICD-10-CM | POA: Diagnosis not present

## 2021-04-18 DIAGNOSIS — N186 End stage renal disease: Secondary | ICD-10-CM | POA: Diagnosis not present

## 2021-04-18 DIAGNOSIS — N2581 Secondary hyperparathyroidism of renal origin: Secondary | ICD-10-CM | POA: Diagnosis not present

## 2021-04-19 DIAGNOSIS — R252 Cramp and spasm: Secondary | ICD-10-CM | POA: Diagnosis not present

## 2021-04-19 DIAGNOSIS — Z992 Dependence on renal dialysis: Secondary | ICD-10-CM | POA: Diagnosis not present

## 2021-04-19 DIAGNOSIS — N186 End stage renal disease: Secondary | ICD-10-CM | POA: Diagnosis not present

## 2021-04-19 DIAGNOSIS — N2581 Secondary hyperparathyroidism of renal origin: Secondary | ICD-10-CM | POA: Diagnosis not present

## 2021-04-19 DIAGNOSIS — I953 Hypotension of hemodialysis: Secondary | ICD-10-CM | POA: Diagnosis not present

## 2021-04-20 DIAGNOSIS — L89623 Pressure ulcer of left heel, stage 3: Secondary | ICD-10-CM | POA: Diagnosis not present

## 2021-04-20 DIAGNOSIS — I7300 Raynaud's syndrome without gangrene: Secondary | ICD-10-CM | POA: Diagnosis not present

## 2021-04-20 DIAGNOSIS — L8996 Pressure-induced deep tissue damage of unspecified site: Secondary | ICD-10-CM | POA: Diagnosis not present

## 2021-04-22 DIAGNOSIS — I953 Hypotension of hemodialysis: Secondary | ICD-10-CM | POA: Diagnosis not present

## 2021-04-22 DIAGNOSIS — R252 Cramp and spasm: Secondary | ICD-10-CM | POA: Diagnosis not present

## 2021-04-22 DIAGNOSIS — Z992 Dependence on renal dialysis: Secondary | ICD-10-CM | POA: Diagnosis not present

## 2021-04-22 DIAGNOSIS — N186 End stage renal disease: Secondary | ICD-10-CM | POA: Diagnosis not present

## 2021-04-22 DIAGNOSIS — N2581 Secondary hyperparathyroidism of renal origin: Secondary | ICD-10-CM | POA: Diagnosis not present

## 2021-04-23 DIAGNOSIS — I953 Hypotension of hemodialysis: Secondary | ICD-10-CM | POA: Diagnosis not present

## 2021-04-23 DIAGNOSIS — Z992 Dependence on renal dialysis: Secondary | ICD-10-CM | POA: Diagnosis not present

## 2021-04-23 DIAGNOSIS — N186 End stage renal disease: Secondary | ICD-10-CM | POA: Diagnosis not present

## 2021-04-23 DIAGNOSIS — N2581 Secondary hyperparathyroidism of renal origin: Secondary | ICD-10-CM | POA: Diagnosis not present

## 2021-04-23 DIAGNOSIS — R252 Cramp and spasm: Secondary | ICD-10-CM | POA: Diagnosis not present

## 2021-04-25 DIAGNOSIS — N186 End stage renal disease: Secondary | ICD-10-CM | POA: Diagnosis not present

## 2021-04-25 DIAGNOSIS — I953 Hypotension of hemodialysis: Secondary | ICD-10-CM | POA: Diagnosis not present

## 2021-04-25 DIAGNOSIS — R252 Cramp and spasm: Secondary | ICD-10-CM | POA: Diagnosis not present

## 2021-04-25 DIAGNOSIS — N2581 Secondary hyperparathyroidism of renal origin: Secondary | ICD-10-CM | POA: Diagnosis not present

## 2021-04-25 DIAGNOSIS — Z992 Dependence on renal dialysis: Secondary | ICD-10-CM | POA: Diagnosis not present

## 2021-04-26 DIAGNOSIS — Z992 Dependence on renal dialysis: Secondary | ICD-10-CM | POA: Diagnosis not present

## 2021-04-26 DIAGNOSIS — N2581 Secondary hyperparathyroidism of renal origin: Secondary | ICD-10-CM | POA: Diagnosis not present

## 2021-04-26 DIAGNOSIS — I953 Hypotension of hemodialysis: Secondary | ICD-10-CM | POA: Diagnosis not present

## 2021-04-26 DIAGNOSIS — R252 Cramp and spasm: Secondary | ICD-10-CM | POA: Diagnosis not present

## 2021-04-26 DIAGNOSIS — N186 End stage renal disease: Secondary | ICD-10-CM | POA: Diagnosis not present

## 2021-04-26 DIAGNOSIS — Z89511 Acquired absence of right leg below knee: Secondary | ICD-10-CM | POA: Diagnosis not present

## 2021-04-28 DIAGNOSIS — E1129 Type 2 diabetes mellitus with other diabetic kidney complication: Secondary | ICD-10-CM | POA: Diagnosis not present

## 2021-04-28 DIAGNOSIS — Z992 Dependence on renal dialysis: Secondary | ICD-10-CM | POA: Diagnosis not present

## 2021-04-28 DIAGNOSIS — N186 End stage renal disease: Secondary | ICD-10-CM | POA: Diagnosis not present

## 2021-04-29 DIAGNOSIS — N2581 Secondary hyperparathyroidism of renal origin: Secondary | ICD-10-CM | POA: Diagnosis not present

## 2021-04-29 DIAGNOSIS — Z992 Dependence on renal dialysis: Secondary | ICD-10-CM | POA: Diagnosis not present

## 2021-04-29 DIAGNOSIS — N186 End stage renal disease: Secondary | ICD-10-CM | POA: Diagnosis not present

## 2021-04-30 ENCOUNTER — Telehealth (INDEPENDENT_AMBULATORY_CARE_PROVIDER_SITE_OTHER): Payer: BC Managed Care – PPO | Admitting: Endocrinology

## 2021-04-30 ENCOUNTER — Telehealth: Payer: Self-pay | Admitting: Nutrition

## 2021-04-30 VITALS — Ht 69.0 in | Wt 159.0 lb

## 2021-04-30 DIAGNOSIS — E119 Type 2 diabetes mellitus without complications: Secondary | ICD-10-CM | POA: Diagnosis not present

## 2021-04-30 DIAGNOSIS — N2581 Secondary hyperparathyroidism of renal origin: Secondary | ICD-10-CM | POA: Diagnosis not present

## 2021-04-30 DIAGNOSIS — N186 End stage renal disease: Secondary | ICD-10-CM | POA: Diagnosis not present

## 2021-04-30 DIAGNOSIS — Z992 Dependence on renal dialysis: Secondary | ICD-10-CM | POA: Diagnosis not present

## 2021-04-30 NOTE — Patient Instructions (Addendum)
check your blood sugar 4 times a day: before the 3 meals, and at bedtime.  also check if you have symptoms of your blood sugar being too high or too low.  please keep a record of the readings and bring it to your next appointment here (or you can bring the meter itself).  You can write it on any piece of paper.  please call us sooner if your blood sugar goes below 70, or if you have a lot of readings over 200.  Please take these settings: basal rate of 0.05 units/hr. bolus of 1 unit/12 grams carbohydrate.   correction bolus (which some people call "sensitivity," or "insulin sensitivity ratio," or just "isr") of 1 unit for each 100 by which your glucose exceeds 120.   I am asking Duane Hall about the IOB problem.      Please come back for a follow-up appointment in 3-4 weeks, by video if you prefer.

## 2021-04-30 NOTE — Progress Notes (Signed)
Subjective:    Patient ID: Duane Hall, male    DOB: [birth year], 65 y.o.   MRN: [id number]  HPI  telehealth visit today via video visit.  Alternatives to telehealth are presented to this patient, and the patient agrees to the telehealth visit.   Pt is advised of the cost of the visit, and agrees to this, also.   Patient is at home, and I am at the office.   Persons attending the telehealth visit: the patient and I Pt returns for f/u of diabetes mellitus: DM type: Insulin-requiring type 2, but he is at risk for evolving type 1.  Dx'ed: 7425 Complications: ESRD, foot ulcers, GP, PAD, and PDR.   Therapy: insulin since 2021 (Omnipod Dash since 1/22) DKA: never Severe hypoglycemia: never.  Pancreatitis: never Pancreatic imaging:  SDOH: Wife provides most hx, due to pt's poor overall health; he eats meals at 10AM, (sometimes 2PM), and 6PM; He eats small breakfast and lunch, and a larger dinner.  Other: fructosamine converts to higher avg glucose than A1c, prob due to renal failure; he has [place] continuous glucose monitor; he stopped Trulicity, due to GP; he eats meals at 10AM, 1PM, and 6PM.  He often skips lunch; he has very long duration of action of insulin.  Interval history: He started Omnipods, with these settings:  basal rate of 0.05 units/hr, 24HRS per day.  bolus of 1 unit/16 grams carbohydrate.  correction bolus (which some people call "sensitivity," or "insulin sensitivity ratio," or just "isr") of 1 unit for each 100 by which your glucose exceeds 120.    I reviewed continuous glucose monitor data.  Glucose varies from 150-340.  It is in general lowest at 1PM.  Otherwise, there is no trend throughout the day.  Since last pump adjustment, he denies hypoglycemia.  We obtained pump setting and delivery info after pt says he uploaded, but info is several mos out of date.  Pt says some boluses are being rejected, due to insulin on board Past Medical History:  Diagnosis Date  . Asthma    . Cancer ([place])   . Cataract   . CKD (chronic kidney disease), stage III ([place])   . Diabetes mellitus   . Glaucoma   . Vascular insufficiency 05/2020    Past Surgical History:  Procedure Laterality Date  . ABDOMINAL AORTOGRAM W/LOWER EXTREMITY N/A 06/19/2020   Procedure: ABDOMINAL AORTOGRAM W/LOWER EXTREMITY;  Surgeon: Waynetta Sandy, MD;  Location: [facility];  Service: Cardiovascular;  Laterality: N/A;  . ABDOMINAL AORTOGRAM W/LOWER EXTREMITY Left 10/16/2020   Procedure: ABDOMINAL AORTOGRAM W/LOWER EXTREMITY;  Surgeon: Waynetta Sandy, MD;  Location: [facility];  Service: Cardiovascular;  Laterality: Left;  . ABDOMINAL AORTOGRAM W/LOWER EXTREMITY N/A 12/25/2020   Procedure: ABDOMINAL AORTOGRAM W/LOWER EXTREMITY;  Surgeon: Waynetta Sandy, MD;  Location: [facility];  Service: Cardiovascular;  Laterality: N/A;  . AMPUTATION Right 01/12/2021   Procedure: 1ST AMPUTATION RAY;  Surgeon: [name], DPM;  Location: [facility];  Service: Podiatry;  Laterality: Right;  . AMPUTATION Right 03/13/2021   Procedure: RIGHT BELOW KNEE AMPUTATION;  Surgeon: Waynetta Sandy, MD;  Location: [facility];  Service: Vascular;  Laterality: Right;  . AV FISTULA PLACEMENT Right 09/14/2019   Procedure: Right Arm Basilic Vein transposition;  Surgeon: Angelia Mould, MD;  Location: [facility];  Service: Vascular;  Laterality: Right;  . BONE BIOPSY Left 07/29/2020   Procedure: BONE BIOPSY;  Surgeon: [name], DPM;  Location: [facility];  Service: Podiatry;  Laterality: Left;  Need bone trephines and/or large bore Giamshidi  . COLONOSCOPY    . PERIPHERAL VASCULAR ATHERECTOMY Left 06/19/2020   Procedure: PERIPHERAL VASCULAR ATHERECTOMY;  Surgeon: Waynetta Sandy, MD;  Location: [facility];  Service: Cardiovascular;  Laterality: Left;  SFA  . PERIPHERAL VASCULAR ATHERECTOMY  12/25/2020   Procedure: PERIPHERAL VASCULAR ATHERECTOMY;  Surgeon:  Waynetta Sandy, MD;  Location: [facility];  Service: Cardiovascular;;  Lt. PT - Laser Lt. SFA - Laser  . PERIPHERAL VASCULAR BALLOON ANGIOPLASTY  12/25/2020   Procedure: PERIPHERAL VASCULAR BALLOON ANGIOPLASTY;  Surgeon: Waynetta Sandy, MD;  Location: [facility];  Service: Cardiovascular;;  Lt. SFA and PT  . UPPER GASTROINTESTINAL ENDOSCOPY  02/2019   Dr Benson Norway      Social History   Socioeconomic History  . Marital status: Married    Spouse name: Not on file  . Number of children: Not on file  . Years of education: Not on file  . Highest education level: Not on file  Occupational History  . Occupation: Chief Financial Officer  Tobacco Use  . Smoking status: Current Every Day Smoker    Packs/day: 0.30    Types: Cigarettes  . Smokeless tobacco: Never Used  Vaping Use  . Vaping Use: Never used  Substance and Sexual Activity  . Alcohol use: No    Alcohol/week: 0.0 standard drinks  . Drug use: No  . Sexual activity: Not on file  Other Topics Concern  . Not on file  Social History Narrative   Married.    Social Determinants of Health   Financial Resource Strain: Not on file  Food Insecurity: Not on file  Transportation Needs: Not on file  Physical Activity: Not on file  Stress: Not on file  Social Connections: Not on file  Intimate Partner Violence: Not on file    Current Outpatient Medications on File Prior to Visit  Medication Sig Dispense Refill  . acidophilus (RISAQUAD) CAPS capsule Take 2 capsules by mouth daily.    Marland Kitchen aspirin EC 81 MG EC tablet Take 1 tablet (81 mg total) by mouth daily. Swallow whole. (Patient taking differently: Take 81 mg by mouth every morning. Swallow whole.) 30 tablet 11  . clopidogrel (PLAVIX) 75 MG tablet Take 1 tablet (75 mg total) by mouth daily with breakfast. (Patient taking differently: Take 75 mg by mouth every morning.) 30 tablet 3  . docusate sodium (COLACE) 100 MG capsule Take 100 mg by mouth every morning.    .  ferric citrate (AURYXIA) 1 GM 210 MG(Fe) tablet Take 210 mg by mouth 2 (two) times daily with a meal.    . Glucagon (GVOKE HYPOPEN 1-PACK) 1 MG/0.2ML SOAJ Inject 1 mg into the skin once as needed (low blood sugar).    . Insulin Aspart, w/Niacinamide, (FIASP) 100 UNIT/ML SOLN For use in pump, total of 30 units per day 90 mL 0  . Insulin Disposable Pump (OMNIPOD DASH 5 PACK PODS) MISC Use as instructed: use pod every 3 days. 30 each 3  . lidocaine-prilocaine (EMLA) cream Apply 1 application topically See admin instructions. Apply topically to port access one hour prior to dialysis on Sunday, Monday, Wednesday, Thursday    . multivitamin (RENA-VIT) TABS tablet Take 1 tablet by mouth every morning.    . pantoprazole (PROTONIX) 40 MG tablet Take 1 tablet (40 mg total) by mouth daily.    . rosuvastatin (CRESTOR) 10 MG tablet TAKE  1 TABLET BY MOUTH DAILY (Patient taking differently: Take 10 mg by mouth every morning.) 30 tablet 11   No current facility-administered medications on file prior to visit.    Allergies  Allergen Reactions  . Amoxicillin-Pot Clavulanate Diarrhea and Other (See Comments)  . Midodrine Other (See Comments)    Other reaction(s): Urinary Sensation  . Tape Other (See Comments)    bandaids cause blistering    Family History  Problem Relation Age of Onset  . Cancer Father   . Diabetes Mother     Ht 5\' 9"  (1.753 m)   Wt 159 lb (72.1 kg)   BMI 23.48 kg/m   Review of Systems     Objective:   Physical Exam       Assessment & Plan:  Insulin-requiring type 2 DM: uncontrolled.  therapy limited by long duration of action of insulin.    Patient Instructions  check your blood sugar 4 times a day: before the 3 meals, and at bedtime.  also check if you have symptoms of your blood sugar being too high or too low.  please keep a record of the readings and bring it to your next appointment here (or you can bring the meter itself).  You can write it on any piece of paper.   please call us sooner if your blood sugar goes below 70, or if you have a lot of readings over 200.  Please take these settings: basal rate of 0.05 units/hr. bolus of 1 unit/12 grams carbohydrate.   correction bolus (which some people call "sensitivity," or "insulin sensitivity ratio," or just "isr") of 1 unit for each 100 by which your glucose exceeds 120.   I am asking Vaughan Basta about the IOB problem.      Please come back for a follow-up appointment in 3-4 weeks, by video if you prefer.

## 2021-05-01 ENCOUNTER — Ambulatory Visit (INDEPENDENT_AMBULATORY_CARE_PROVIDER_SITE_OTHER): Payer: BC Managed Care – PPO | Admitting: Physician Assistant

## 2021-05-01 ENCOUNTER — Other Ambulatory Visit: Payer: Self-pay

## 2021-05-01 VITALS — BP 119/63 | HR 90 | Temp 98.6°F | Resp 20 | Ht 69.0 in | Wt 158.7 lb

## 2021-05-01 DIAGNOSIS — I739 Peripheral vascular disease, unspecified: Secondary | ICD-10-CM

## 2021-05-01 NOTE — Progress Notes (Signed)
POST OPERATIVE OFFICE NOTE    CC:  F/u for surgery  HPI:  This is a 65 y.o. male who is s/p right below the knee amputation by Dr. Donzetta Matters on March 13, 2021.  His wife accompanies him today.  His only complaint is of some discomfort wearing his retention sock at nighttime.  He denies skin breakdown on his left foot.  He has history of  revascularization of bilateral lower extremities for bilateral foot wounds.  He is compliant with aspirin, statin and Plavix. Allergies  Allergen Reactions  . Amoxicillin-Pot Clavulanate Diarrhea and Other (See Comments)  . Midodrine Other (See Comments)    Other reaction(s): Urinary Sensation  . Tape Other (See Comments)    bandaids cause blistering    Current Outpatient Medications  Medication Sig Dispense Refill  . acidophilus (RISAQUAD) CAPS capsule Take 2 capsules by mouth daily.    Marland Kitchen aspirin EC 81 MG EC tablet Take 1 tablet (81 mg total) by mouth daily. Swallow whole. (Patient taking differently: Take 81 mg by mouth every morning. Swallow whole.) 30 tablet 11  . clopidogrel (PLAVIX) 75 MG tablet Take 1 tablet (75 mg total) by mouth daily with breakfast. (Patient taking differently: Take 75 mg by mouth every morning.) 30 tablet 3  . docusate sodium (COLACE) 100 MG capsule Take 100 mg by mouth every morning.    . ferric citrate (AURYXIA) 1 GM 210 MG(Fe) tablet Take 210 mg by mouth 2 (two) times daily with a meal.    . Glucagon (GVOKE HYPOPEN 1-PACK) 1 MG/0.2ML SOAJ Inject 1 mg into the skin once as needed (low blood sugar).    . Insulin Aspart, w/Niacinamide, (FIASP) 100 UNIT/ML SOLN For use in pump, total of 30 units per day 90 mL 0  . Insulin Disposable Pump (OMNIPOD DASH 5 PACK PODS) MISC Use as instructed: use pod every 3 days. 30 each 3  . lidocaine-prilocaine (EMLA) cream Apply 1 application topically See admin instructions. Apply topically to port access one hour prior to dialysis on Sunday, Monday, Wednesday, Thursday    . multivitamin  (RENA-VIT) TABS tablet Take 1 tablet by mouth every morning.    . pantoprazole (PROTONIX) 40 MG tablet Take 1 tablet (40 mg total) by mouth daily.    . rosuvastatin (CRESTOR) 10 MG tablet TAKE 1 TABLET BY MOUTH DAILY (Patient taking differently: Take 10 mg by mouth every morning.) 30 tablet 11   No current facility-administered medications for this visit.     ROS:  See HPI  BP 119/63 (BP Location: Left Arm, Patient Position: Sitting, Cuff Size: Normal)   Pulse 90   Temp 98.6 F (37 C) (Temporal)   Resp 20   Ht 5\' 9"  (1.753 m)   Wt 158 lb 11.7 oz (72 kg)   SpO2 99%   BMI 23.44 kg/m   Physical Exam:  General appearance: Well-developed, well-nourished in no apparent distress.  He ambulates with a wheelchair. Cardiac: Rate and rhythm regular Respiratory: Nonlabored Incision: Well-healed Extremities: Right residual limb: Flaps are warm and well-perfused. Neuro: Alert and oriented x4   Assessment/Plan:  This is a 65 y.o. male who is s/p: Below the knee amputation.  Healing nicely.  Sutures were removed at last visit.  Refer to [employer] prosthetics. PAD: Follow-up with bilateral lower extremity arterial duplex in 6 months. Advised the patient to call our office sooner should he develop rest pain or nonhealing wounds.  Risa Grill, PA-C [facility] [phone]  Clinic MD: Dr. Carlis Abbott

## 2021-05-02 DIAGNOSIS — Z992 Dependence on renal dialysis: Secondary | ICD-10-CM | POA: Diagnosis not present

## 2021-05-02 DIAGNOSIS — N186 End stage renal disease: Secondary | ICD-10-CM | POA: Diagnosis not present

## 2021-05-02 DIAGNOSIS — N2581 Secondary hyperparathyroidism of renal origin: Secondary | ICD-10-CM | POA: Diagnosis not present

## 2021-05-03 ENCOUNTER — Other Ambulatory Visit: Payer: Self-pay

## 2021-05-03 DIAGNOSIS — N2581 Secondary hyperparathyroidism of renal origin: Secondary | ICD-10-CM | POA: Diagnosis not present

## 2021-05-03 DIAGNOSIS — N186 End stage renal disease: Secondary | ICD-10-CM | POA: Diagnosis not present

## 2021-05-03 DIAGNOSIS — Z992 Dependence on renal dialysis: Secondary | ICD-10-CM | POA: Diagnosis not present

## 2021-05-03 DIAGNOSIS — I739 Peripheral vascular disease, unspecified: Secondary | ICD-10-CM

## 2021-05-06 DIAGNOSIS — Z992 Dependence on renal dialysis: Secondary | ICD-10-CM | POA: Diagnosis not present

## 2021-05-06 DIAGNOSIS — N186 End stage renal disease: Secondary | ICD-10-CM | POA: Diagnosis not present

## 2021-05-06 DIAGNOSIS — N2581 Secondary hyperparathyroidism of renal origin: Secondary | ICD-10-CM | POA: Diagnosis not present

## 2021-05-07 DIAGNOSIS — N2581 Secondary hyperparathyroidism of renal origin: Secondary | ICD-10-CM | POA: Diagnosis not present

## 2021-05-07 DIAGNOSIS — Z992 Dependence on renal dialysis: Secondary | ICD-10-CM | POA: Diagnosis not present

## 2021-05-07 DIAGNOSIS — N186 End stage renal disease: Secondary | ICD-10-CM | POA: Diagnosis not present

## 2021-05-09 DIAGNOSIS — N186 End stage renal disease: Secondary | ICD-10-CM | POA: Diagnosis not present

## 2021-05-09 DIAGNOSIS — Z992 Dependence on renal dialysis: Secondary | ICD-10-CM | POA: Diagnosis not present

## 2021-05-09 DIAGNOSIS — N2581 Secondary hyperparathyroidism of renal origin: Secondary | ICD-10-CM | POA: Diagnosis not present

## 2021-05-09 NOTE — Telephone Encounter (Signed)
Opened in error

## 2021-05-10 DIAGNOSIS — N186 End stage renal disease: Secondary | ICD-10-CM | POA: Diagnosis not present

## 2021-05-10 DIAGNOSIS — Z992 Dependence on renal dialysis: Secondary | ICD-10-CM | POA: Diagnosis not present

## 2021-05-10 DIAGNOSIS — N2581 Secondary hyperparathyroidism of renal origin: Secondary | ICD-10-CM | POA: Diagnosis not present

## 2021-05-13 DIAGNOSIS — Z992 Dependence on renal dialysis: Secondary | ICD-10-CM | POA: Diagnosis not present

## 2021-05-13 DIAGNOSIS — N2581 Secondary hyperparathyroidism of renal origin: Secondary | ICD-10-CM | POA: Diagnosis not present

## 2021-05-13 DIAGNOSIS — N186 End stage renal disease: Secondary | ICD-10-CM | POA: Diagnosis not present

## 2021-05-14 DIAGNOSIS — Z992 Dependence on renal dialysis: Secondary | ICD-10-CM | POA: Diagnosis not present

## 2021-05-14 DIAGNOSIS — N2581 Secondary hyperparathyroidism of renal origin: Secondary | ICD-10-CM | POA: Diagnosis not present

## 2021-05-14 DIAGNOSIS — N186 End stage renal disease: Secondary | ICD-10-CM | POA: Diagnosis not present

## 2021-05-16 DIAGNOSIS — N186 End stage renal disease: Secondary | ICD-10-CM | POA: Diagnosis not present

## 2021-05-16 DIAGNOSIS — N2581 Secondary hyperparathyroidism of renal origin: Secondary | ICD-10-CM | POA: Diagnosis not present

## 2021-05-16 DIAGNOSIS — Z992 Dependence on renal dialysis: Secondary | ICD-10-CM | POA: Diagnosis not present

## 2021-05-17 DIAGNOSIS — N2581 Secondary hyperparathyroidism of renal origin: Secondary | ICD-10-CM | POA: Diagnosis not present

## 2021-05-17 DIAGNOSIS — N186 End stage renal disease: Secondary | ICD-10-CM | POA: Diagnosis not present

## 2021-05-17 DIAGNOSIS — Z992 Dependence on renal dialysis: Secondary | ICD-10-CM | POA: Diagnosis not present

## 2021-05-18 DIAGNOSIS — M5136 Other intervertebral disc degeneration, lumbar region: Secondary | ICD-10-CM | POA: Insufficient documentation

## 2021-05-20 DIAGNOSIS — Z992 Dependence on renal dialysis: Secondary | ICD-10-CM | POA: Diagnosis not present

## 2021-05-20 DIAGNOSIS — N2581 Secondary hyperparathyroidism of renal origin: Secondary | ICD-10-CM | POA: Diagnosis not present

## 2021-05-20 DIAGNOSIS — N186 End stage renal disease: Secondary | ICD-10-CM | POA: Diagnosis not present

## 2021-05-23 ENCOUNTER — Ambulatory Visit: Payer: BC Managed Care – PPO | Admitting: Physical Medicine & Rehabilitation

## 2021-05-23 DIAGNOSIS — N186 End stage renal disease: Secondary | ICD-10-CM | POA: Diagnosis not present

## 2021-05-23 DIAGNOSIS — N2581 Secondary hyperparathyroidism of renal origin: Secondary | ICD-10-CM | POA: Diagnosis not present

## 2021-05-23 DIAGNOSIS — Z992 Dependence on renal dialysis: Secondary | ICD-10-CM | POA: Diagnosis not present

## 2021-05-24 DIAGNOSIS — Z992 Dependence on renal dialysis: Secondary | ICD-10-CM | POA: Diagnosis not present

## 2021-05-24 DIAGNOSIS — N2581 Secondary hyperparathyroidism of renal origin: Secondary | ICD-10-CM | POA: Diagnosis not present

## 2021-05-24 DIAGNOSIS — N186 End stage renal disease: Secondary | ICD-10-CM | POA: Diagnosis not present

## 2021-05-26 DIAGNOSIS — Z89511 Acquired absence of right leg below knee: Secondary | ICD-10-CM | POA: Diagnosis not present

## 2021-05-27 DIAGNOSIS — N2581 Secondary hyperparathyroidism of renal origin: Secondary | ICD-10-CM | POA: Diagnosis not present

## 2021-05-27 DIAGNOSIS — Z992 Dependence on renal dialysis: Secondary | ICD-10-CM | POA: Diagnosis not present

## 2021-05-27 DIAGNOSIS — N186 End stage renal disease: Secondary | ICD-10-CM | POA: Diagnosis not present

## 2021-05-28 DIAGNOSIS — N2581 Secondary hyperparathyroidism of renal origin: Secondary | ICD-10-CM | POA: Diagnosis not present

## 2021-05-28 DIAGNOSIS — N186 End stage renal disease: Secondary | ICD-10-CM | POA: Diagnosis not present

## 2021-05-28 DIAGNOSIS — Z992 Dependence on renal dialysis: Secondary | ICD-10-CM | POA: Diagnosis not present

## 2021-05-29 DIAGNOSIS — E1129 Type 2 diabetes mellitus with other diabetic kidney complication: Secondary | ICD-10-CM | POA: Diagnosis not present

## 2021-05-29 DIAGNOSIS — N186 End stage renal disease: Secondary | ICD-10-CM | POA: Diagnosis not present

## 2021-05-29 DIAGNOSIS — Z992 Dependence on renal dialysis: Secondary | ICD-10-CM | POA: Diagnosis not present

## 2021-05-30 ENCOUNTER — Other Ambulatory Visit: Payer: Self-pay

## 2021-05-30 ENCOUNTER — Encounter: Payer: BC Managed Care – PPO | Attending: Registered Nurse | Admitting: Physical Medicine & Rehabilitation

## 2021-05-30 ENCOUNTER — Encounter: Payer: Self-pay | Admitting: Physical Medicine & Rehabilitation

## 2021-05-30 VITALS — BP 113/65 | HR 85 | Temp 98.4°F | Ht 69.0 in | Wt 160.8 lb

## 2021-05-30 DIAGNOSIS — N186 End stage renal disease: Secondary | ICD-10-CM | POA: Diagnosis not present

## 2021-05-30 DIAGNOSIS — Z89511 Acquired absence of right leg below knee: Secondary | ICD-10-CM | POA: Insufficient documentation

## 2021-05-30 DIAGNOSIS — I739 Peripheral vascular disease, unspecified: Secondary | ICD-10-CM | POA: Insufficient documentation

## 2021-05-30 DIAGNOSIS — N2581 Secondary hyperparathyroidism of renal origin: Secondary | ICD-10-CM | POA: Diagnosis not present

## 2021-05-30 DIAGNOSIS — Z992 Dependence on renal dialysis: Secondary | ICD-10-CM | POA: Diagnosis not present

## 2021-05-30 NOTE — Patient Instructions (Addendum)
PLEASE FEEL FREE TO CALL OUR OFFICE WITH ANY PROBLEMS OR QUESTIONS ([phone])   WORK ON YOUR STANDING AND TRANSFERRING AT HOME WHILE STANDING ON YOUR WALKER OR HOLDING ON TO THE COUNTER

## 2021-05-30 NOTE — Progress Notes (Signed)
Subjective:    Patient ID: Duane Hall, male    DOB: 1956-11-10, 65 y.o.   MRN: [id number]  HPI   Mr. Hammitt is here in follow-up of his right below-knee amputation.  He saw our nurse practitioner last visit and is continued to progress from a mobility standpoint at home.  He saw [facility] yesterday who casted him.  He continues to wear his shrinker as advised.  Wound is healing nicely.  He has minimal pain at this point and is on no pain medication.  He is doing some standing and transferring but not much to improve his core and trunk strength.  He states that he is always had difficulties balancing on the left leg.  Left leg has been intact with no signs of breakdown.  He and his wife inspect it weekly.  Sugars are under control.  He remains on dialysis.   Pain Inventory Average Pain 1 Pain Right Now 0 My pain is intermittent and phantom pain on right leg  In the last 24 hours, has pain interfered with the following? General activity 0 Relation with others 0 Enjoyment of life 0 What TIME of day is your pain at its worst? varies Sleep (in general) Fair  Pain is worse with: unsure Pain improves with: unknown Relief from Meds: no pain meds taken  Family History  Problem Relation Age of Onset  . Cancer Father   . Diabetes Mother    Social History   Socioeconomic History  . Marital status: Married    Spouse name: Not on file  . Number of children: Not on file  . Years of education: Not on file  . Highest education level: Not on file  Occupational History  . Occupation: Chief Financial Officer  Tobacco Use  . Smoking status: Current Every Day Smoker    Packs/day: 0.30    Types: Cigarettes  . Smokeless tobacco: Never Used  Vaping Use  . Vaping Use: Never used  Substance and Sexual Activity  . Alcohol use: No    Alcohol/week: 0.0 standard drinks  . Drug use: No  . Sexual activity: Not on file  Other Topics Concern  . Not on file  Social History Narrative   Married.     Social Determinants of Health   Financial Resource Strain: Not on file  Food Insecurity: Not on file  Transportation Needs: Not on file  Physical Activity: Not on file  Stress: Not on file  Social Connections: Not on file   Past Surgical History:  Procedure Laterality Date  . ABDOMINAL AORTOGRAM W/LOWER EXTREMITY N/A 06/19/2020   Procedure: ABDOMINAL AORTOGRAM W/LOWER EXTREMITY;  Surgeon: Waynetta Sandy, MD;  Location: [place] [facility];  Service: Cardiovascular;  Laterality: N/A;  . ABDOMINAL AORTOGRAM W/LOWER EXTREMITY Left 10/16/2020   Procedure: ABDOMINAL AORTOGRAM W/LOWER EXTREMITY;  Surgeon: Waynetta Sandy, MD;  Location: [place] [facility];  Service: Cardiovascular;  Laterality: Left;  . ABDOMINAL AORTOGRAM W/LOWER EXTREMITY N/A 12/25/2020   Procedure: ABDOMINAL AORTOGRAM W/LOWER EXTREMITY;  Surgeon: Waynetta Sandy, MD;  Location: [place] [facility];  Service: Cardiovascular;  Laterality: N/A;  . AMPUTATION Right 01/12/2021   Procedure: 1ST AMPUTATION RAY;  Surgeon: [name], DPM;  Location: [place];  Service: Podiatry;  Laterality: Right;  . AMPUTATION Right 03/13/2021   Procedure: RIGHT BELOW KNEE AMPUTATION;  Surgeon: Waynetta Sandy, MD;  Location: [place];  Service: Vascular;  Laterality: Right;  . AV FISTULA PLACEMENT Right 09/14/2019   Procedure:  Right Arm Basilic Vein transposition;  Surgeon: Angelia Mould, MD;  Location: [place];  Service: Vascular;  Laterality: Right;  . BONE BIOPSY Left 07/29/2020   Procedure: BONE BIOPSY;  Surgeon: [name], DPM;  Location: [place];  Service: Podiatry;  Laterality: Left;  Need bone trephines and/or large bore Giamshidi  . COLONOSCOPY    . PERIPHERAL VASCULAR ATHERECTOMY Left 06/19/2020   Procedure: PERIPHERAL VASCULAR ATHERECTOMY;  Surgeon: Waynetta Sandy, MD;  Location: [place] [facility];  Service: Cardiovascular;  Laterality: Left;  SFA  . PERIPHERAL VASCULAR  ATHERECTOMY  12/25/2020   Procedure: PERIPHERAL VASCULAR ATHERECTOMY;  Surgeon: Waynetta Sandy, MD;  Location: [place] [facility];  Service: Cardiovascular;;  Lt. PT - Laser Lt. SFA - Laser  . PERIPHERAL VASCULAR BALLOON ANGIOPLASTY  12/25/2020   Procedure: PERIPHERAL VASCULAR BALLOON ANGIOPLASTY;  Surgeon: Waynetta Sandy, MD;  Location: [place] [facility];  Service: Cardiovascular;;  Lt. SFA and PT  . UPPER GASTROINTESTINAL ENDOSCOPY  02/2019   Dr Benson [place]     Past Surgical History:  Procedure Laterality Date  . ABDOMINAL AORTOGRAM W/LOWER EXTREMITY N/A 06/19/2020   Procedure: ABDOMINAL AORTOGRAM W/LOWER EXTREMITY;  Surgeon: Waynetta Sandy, MD;  Location: [place] [facility];  Service: Cardiovascular;  Laterality: N/A;  . ABDOMINAL AORTOGRAM W/LOWER EXTREMITY Left 10/16/2020   Procedure: ABDOMINAL AORTOGRAM W/LOWER EXTREMITY;  Surgeon: Waynetta Sandy, MD;  Location: [place] [facility];  Service: Cardiovascular;  Laterality: Left;  . ABDOMINAL AORTOGRAM W/LOWER EXTREMITY N/A 12/25/2020   Procedure: ABDOMINAL AORTOGRAM W/LOWER EXTREMITY;  Surgeon: Waynetta Sandy, MD;  Location: [place] [facility];  Service: Cardiovascular;  Laterality: N/A;  . AMPUTATION Right 01/12/2021   Procedure: 1ST AMPUTATION RAY;  Surgeon: [name], DPM;  Location: [place];  Service: Podiatry;  Laterality: Right;  . AMPUTATION Right 03/13/2021   Procedure: RIGHT BELOW KNEE AMPUTATION;  Surgeon: Waynetta Sandy, MD;  Location: [place];  Service: Vascular;  Laterality: Right;  . AV FISTULA PLACEMENT Right 09/14/2019   Procedure: Right Arm Basilic Vein transposition;  Surgeon: Angelia Mould, MD;  Location: [place];  Service: Vascular;  Laterality: Right;  . BONE BIOPSY Left 07/29/2020   Procedure: BONE BIOPSY;  Surgeon: [name], DPM;  Location: [place];  Service: Podiatry;  Laterality: Left;  Need bone trephines and/or large bore Giamshidi  .  COLONOSCOPY    . PERIPHERAL VASCULAR ATHERECTOMY Left 06/19/2020   Procedure: PERIPHERAL VASCULAR ATHERECTOMY;  Surgeon: Waynetta Sandy, MD;  Location: [place] [facility];  Service: Cardiovascular;  Laterality: Left;  SFA  . PERIPHERAL VASCULAR ATHERECTOMY  12/25/2020   Procedure: PERIPHERAL VASCULAR ATHERECTOMY;  Surgeon: Waynetta Sandy, MD;  Location: [place] [facility];  Service: Cardiovascular;;  Lt. PT - Laser Lt. SFA - Laser  . PERIPHERAL VASCULAR BALLOON ANGIOPLASTY  12/25/2020   Procedure: PERIPHERAL VASCULAR BALLOON ANGIOPLASTY;  Surgeon: Waynetta Sandy, MD;  Location: [place] [facility];  Service: Cardiovascular;;  Lt. SFA and PT  . UPPER GASTROINTESTINAL ENDOSCOPY  02/2019   Dr Benson [place]     Past Medical History:  Diagnosis Date  . Asthma   . Cancer ([place])   . Cataract   . CKD (chronic kidney disease), stage III ([place])   . Diabetes mellitus   . Glaucoma   . Vascular insufficiency 05/2020   BP 113/65   Pulse 85   Temp 98.4 F (36.9 C)   Ht 5\' 9"  (1.753 m)  Wt 160 lb 12.8 oz (72.9 kg)   SpO2 97%   BMI 23.75 kg/m   Opioid Risk Score:   Fall Risk Score:  `1  Depression screen PHQ 2/9  Depression screen [facility] 2/9 04/17/2021 10/10/2020 03/24/2019 03/24/2019 11/08/2015 09/12/2014  Decreased Interest 1 0 0 0 0 0  Down, Depressed, Hopeless 0 0 0 0 0 0  PHQ - 2 Score 1 0 0 0 0 0  Altered sleeping 1 - - - - -  Tired, decreased energy 1 - - - - -  Change in appetite 0 - - - - -  Feeling bad or failure about yourself  0 - - - - -  Trouble concentrating 0 - - - - -  Moving slowly or fidgety/restless 0 - - - - -  Suicidal thoughts 0 - - - - -  PHQ-9 Score 3 - - - - -   Review of Systems  Musculoskeletal: Positive for gait problem.  Skin: Positive for wound.       Right leg stump  All other systems reviewed and are negative.      Objective:   Physical Exam Gen: no distress, normal appearing HEENT: oral mucosa pink and moist, NCAT Cardio: Reg  rate Chest: normal effort, normal rate of breathing Abd: soft, non-distended Ext: no edema Psych: pleasant, normal affect Skin: Right BKA incision is clean and intact with only minimal scabbing left along the incision line. Neuro: Alert and oriented x 3. Normal insight and awareness. Intact Memory. Normal language and speech. Cranial nerve exam unremarkable  Musculoskeletal: some residual swelling of right bka. Well shaped.  Patient has full flexion extension of the knee.  Patient stood easily from his wheelchair to the edge of the sink.      Assessment & Plan:  1.  Status post right below-knee amputation  -pre-gait HEP discussed to improve truncal and proximal lower extremity strength and stamina  -casted yesterday by [facility]  -prosthetic gait training soon. i'll write rx for therapies once we have a date for his prosthetic delivery  -Is appropriate for modified pedals for his car.  He and his wife will discuss with local dealer regarding modifications. 2.  Type 2 diabetes 3.  End-stage renal disease on hemodialysis 4.  Pain control---tylenol   Fifteen minutes of face to face patient care time were spent during this visit. All questions were encouraged and answered.  Follow up with me PRN .

## 2021-05-31 DIAGNOSIS — N186 End stage renal disease: Secondary | ICD-10-CM | POA: Diagnosis not present

## 2021-05-31 DIAGNOSIS — N2581 Secondary hyperparathyroidism of renal origin: Secondary | ICD-10-CM | POA: Diagnosis not present

## 2021-05-31 DIAGNOSIS — Z992 Dependence on renal dialysis: Secondary | ICD-10-CM | POA: Diagnosis not present

## 2021-06-03 DIAGNOSIS — Z992 Dependence on renal dialysis: Secondary | ICD-10-CM | POA: Diagnosis not present

## 2021-06-03 DIAGNOSIS — N2581 Secondary hyperparathyroidism of renal origin: Secondary | ICD-10-CM | POA: Diagnosis not present

## 2021-06-03 DIAGNOSIS — N186 End stage renal disease: Secondary | ICD-10-CM | POA: Diagnosis not present

## 2021-06-04 DIAGNOSIS — Z992 Dependence on renal dialysis: Secondary | ICD-10-CM | POA: Diagnosis not present

## 2021-06-04 DIAGNOSIS — N186 End stage renal disease: Secondary | ICD-10-CM | POA: Diagnosis not present

## 2021-06-04 DIAGNOSIS — N2581 Secondary hyperparathyroidism of renal origin: Secondary | ICD-10-CM | POA: Diagnosis not present

## 2021-06-06 DIAGNOSIS — N186 End stage renal disease: Secondary | ICD-10-CM | POA: Diagnosis not present

## 2021-06-06 DIAGNOSIS — N2581 Secondary hyperparathyroidism of renal origin: Secondary | ICD-10-CM | POA: Diagnosis not present

## 2021-06-06 DIAGNOSIS — Z992 Dependence on renal dialysis: Secondary | ICD-10-CM | POA: Diagnosis not present

## 2021-06-07 DIAGNOSIS — N186 End stage renal disease: Secondary | ICD-10-CM | POA: Diagnosis not present

## 2021-06-07 DIAGNOSIS — Z992 Dependence on renal dialysis: Secondary | ICD-10-CM | POA: Diagnosis not present

## 2021-06-07 DIAGNOSIS — N2581 Secondary hyperparathyroidism of renal origin: Secondary | ICD-10-CM | POA: Diagnosis not present

## 2021-06-10 DIAGNOSIS — N2581 Secondary hyperparathyroidism of renal origin: Secondary | ICD-10-CM | POA: Diagnosis not present

## 2021-06-10 DIAGNOSIS — Z992 Dependence on renal dialysis: Secondary | ICD-10-CM | POA: Diagnosis not present

## 2021-06-10 DIAGNOSIS — N186 End stage renal disease: Secondary | ICD-10-CM | POA: Diagnosis not present

## 2021-06-11 DIAGNOSIS — N186 End stage renal disease: Secondary | ICD-10-CM | POA: Diagnosis not present

## 2021-06-11 DIAGNOSIS — N2581 Secondary hyperparathyroidism of renal origin: Secondary | ICD-10-CM | POA: Diagnosis not present

## 2021-06-11 DIAGNOSIS — Z992 Dependence on renal dialysis: Secondary | ICD-10-CM | POA: Diagnosis not present

## 2021-06-13 DIAGNOSIS — N2581 Secondary hyperparathyroidism of renal origin: Secondary | ICD-10-CM | POA: Diagnosis not present

## 2021-06-13 DIAGNOSIS — Z992 Dependence on renal dialysis: Secondary | ICD-10-CM | POA: Diagnosis not present

## 2021-06-13 DIAGNOSIS — N186 End stage renal disease: Secondary | ICD-10-CM | POA: Diagnosis not present

## 2021-06-14 DIAGNOSIS — Z992 Dependence on renal dialysis: Secondary | ICD-10-CM | POA: Diagnosis not present

## 2021-06-14 DIAGNOSIS — N186 End stage renal disease: Secondary | ICD-10-CM | POA: Diagnosis not present

## 2021-06-14 DIAGNOSIS — N2581 Secondary hyperparathyroidism of renal origin: Secondary | ICD-10-CM | POA: Diagnosis not present

## 2021-06-17 DIAGNOSIS — N2581 Secondary hyperparathyroidism of renal origin: Secondary | ICD-10-CM | POA: Diagnosis not present

## 2021-06-17 DIAGNOSIS — N186 End stage renal disease: Secondary | ICD-10-CM | POA: Diagnosis not present

## 2021-06-17 DIAGNOSIS — Z992 Dependence on renal dialysis: Secondary | ICD-10-CM | POA: Diagnosis not present

## 2021-06-18 ENCOUNTER — Other Ambulatory Visit: Payer: Self-pay

## 2021-06-18 DIAGNOSIS — Z992 Dependence on renal dialysis: Secondary | ICD-10-CM | POA: Diagnosis not present

## 2021-06-18 DIAGNOSIS — N186 End stage renal disease: Secondary | ICD-10-CM | POA: Diagnosis not present

## 2021-06-18 DIAGNOSIS — N2581 Secondary hyperparathyroidism of renal origin: Secondary | ICD-10-CM | POA: Diagnosis not present

## 2021-06-20 ENCOUNTER — Encounter: Payer: Self-pay | Admitting: Endocrinology

## 2021-06-20 DIAGNOSIS — N186 End stage renal disease: Secondary | ICD-10-CM | POA: Diagnosis not present

## 2021-06-20 DIAGNOSIS — Z992 Dependence on renal dialysis: Secondary | ICD-10-CM | POA: Diagnosis not present

## 2021-06-20 DIAGNOSIS — N2581 Secondary hyperparathyroidism of renal origin: Secondary | ICD-10-CM | POA: Diagnosis not present

## 2021-06-21 DIAGNOSIS — N186 End stage renal disease: Secondary | ICD-10-CM | POA: Diagnosis not present

## 2021-06-21 DIAGNOSIS — Z992 Dependence on renal dialysis: Secondary | ICD-10-CM | POA: Diagnosis not present

## 2021-06-21 DIAGNOSIS — N2581 Secondary hyperparathyroidism of renal origin: Secondary | ICD-10-CM | POA: Diagnosis not present

## 2021-06-24 DIAGNOSIS — Z992 Dependence on renal dialysis: Secondary | ICD-10-CM | POA: Diagnosis not present

## 2021-06-24 DIAGNOSIS — N186 End stage renal disease: Secondary | ICD-10-CM | POA: Diagnosis not present

## 2021-06-24 DIAGNOSIS — N2581 Secondary hyperparathyroidism of renal origin: Secondary | ICD-10-CM | POA: Diagnosis not present

## 2021-06-25 DIAGNOSIS — N186 End stage renal disease: Secondary | ICD-10-CM | POA: Diagnosis not present

## 2021-06-25 DIAGNOSIS — N2581 Secondary hyperparathyroidism of renal origin: Secondary | ICD-10-CM | POA: Diagnosis not present

## 2021-06-25 DIAGNOSIS — Z992 Dependence on renal dialysis: Secondary | ICD-10-CM | POA: Diagnosis not present

## 2021-06-26 DIAGNOSIS — Z89511 Acquired absence of right leg below knee: Secondary | ICD-10-CM | POA: Diagnosis not present

## 2021-06-27 DIAGNOSIS — N2581 Secondary hyperparathyroidism of renal origin: Secondary | ICD-10-CM | POA: Diagnosis not present

## 2021-06-27 DIAGNOSIS — N186 End stage renal disease: Secondary | ICD-10-CM | POA: Diagnosis not present

## 2021-06-27 DIAGNOSIS — Z992 Dependence on renal dialysis: Secondary | ICD-10-CM | POA: Diagnosis not present

## 2021-06-28 DIAGNOSIS — N186 End stage renal disease: Secondary | ICD-10-CM | POA: Diagnosis not present

## 2021-06-28 DIAGNOSIS — Z992 Dependence on renal dialysis: Secondary | ICD-10-CM | POA: Diagnosis not present

## 2021-06-28 DIAGNOSIS — E1129 Type 2 diabetes mellitus with other diabetic kidney complication: Secondary | ICD-10-CM | POA: Diagnosis not present

## 2021-06-28 DIAGNOSIS — N2581 Secondary hyperparathyroidism of renal origin: Secondary | ICD-10-CM | POA: Diagnosis not present

## 2021-07-01 DIAGNOSIS — N2581 Secondary hyperparathyroidism of renal origin: Secondary | ICD-10-CM | POA: Diagnosis not present

## 2021-07-01 DIAGNOSIS — Z992 Dependence on renal dialysis: Secondary | ICD-10-CM | POA: Diagnosis not present

## 2021-07-01 DIAGNOSIS — N186 End stage renal disease: Secondary | ICD-10-CM | POA: Diagnosis not present

## 2021-07-02 DIAGNOSIS — N2581 Secondary hyperparathyroidism of renal origin: Secondary | ICD-10-CM | POA: Diagnosis not present

## 2021-07-02 DIAGNOSIS — N186 End stage renal disease: Secondary | ICD-10-CM | POA: Diagnosis not present

## 2021-07-02 DIAGNOSIS — Z992 Dependence on renal dialysis: Secondary | ICD-10-CM | POA: Diagnosis not present

## 2021-07-03 ENCOUNTER — Telehealth: Payer: Self-pay

## 2021-07-03 ENCOUNTER — Telehealth (INDEPENDENT_AMBULATORY_CARE_PROVIDER_SITE_OTHER): Payer: BC Managed Care – PPO | Admitting: Endocrinology

## 2021-07-03 ENCOUNTER — Encounter: Payer: Self-pay | Admitting: Endocrinology

## 2021-07-03 ENCOUNTER — Other Ambulatory Visit: Payer: Self-pay

## 2021-07-03 VITALS — BP 120/66 | Ht 69.0 in | Wt 158.7 lb

## 2021-07-03 DIAGNOSIS — E119 Type 2 diabetes mellitus without complications: Secondary | ICD-10-CM

## 2021-07-03 NOTE — Patient Instructions (Addendum)
check your blood sugar 4 times a day: before the 3 meals, and at bedtime.  also check if you have symptoms of your blood sugar being too high or too low.  please keep a record of the readings and bring it to your next appointment here (or you can bring the meter itself).  You can write it on any piece of paper.  please call us sooner if your blood sugar goes below 70, or if you have a lot of readings over 200.  Please take these settings: basal rate of 0.15 units/hr. bolus of 1 unit/10 grams carbohydrate.   correction bolus (which some people call "sensitivity," or "insulin sensitivity ratio," or just "isr") of 1 unit for each 100 by which your glucose exceeds 120.     Please come back for a follow-up appointment in 2 months, preferably in person.  Please see Vaughan Basta also.

## 2021-07-03 NOTE — Progress Notes (Signed)
Subjective:    Patient ID: Duane Hall, male    DOB: June 25, 1956, 65 y.o.   MRN: [id number]  HPI telehealth visit today via video visit.  Alternatives to telehealth are presented to this patient, and the patient agrees to the telehealth visit. Pt is advised of the cost of the visit, and agrees to this, also.   Patient is at home, and I am at the office.   Persons attending the telehealth visit: the patient and I.   Pt returns for f/u of diabetes mellitus: DM type: Insulin-requiring type 2, but he is at risk for evolving type 1.  Dx'ed: 5638 Complications: ESRD, foot ulcers, GP, PAD, and PDR.   Therapy: insulin since 2021 (Omnipod Dash since 1/22), and FL CGM DKA: never Severe hypoglycemia: never.  Pancreatitis: never Pancreatic imaging:  SDOH: Wife provides most hx, due to pt's poor overall health; he eats meals at 10AM, (sometimes 2PM), and 6PM; He eats small breakfast and lunch, and a larger dinner.  Other: fructosamine converts to higher avg glucose than A1c, prob due to renal failure; he has FL continuous glucose monitor; he stopped Trulicity, due to GP; he eats meals at 10AM, 1PM, and 6PM.  He often skips lunch; he has very long duration of action of insulin.  Interval history: He started Omnipods, with these settings:  basal rate of 0.05 units/hr. bolus of 1 unit/10 grams carbohydrate.   correction bolus (which some people call "sensitivity," or "insulin sensitivity ratio," or just "isr") of 1 unit for each 100 by which your glucose exceeds 120.    He does not know how to check IOB or insulin delivery.   I reviewed continuous glucose monitor data.  Glucose varies from 140-400 (avg 200).  It is in general lowest at [facility], and highest at [place].  However, there is little trend throughout the day. Pt says some boluses are still being rejected, due to insulin on board. Past Medical History:  Diagnosis Date   Asthma    Cancer ([place])    Cataract    CKD (chronic kidney disease),  stage III ([place])    Diabetes mellitus    Glaucoma    Vascular insufficiency 05/2020    Past Surgical History:  Procedure Laterality Date   ABDOMINAL AORTOGRAM W/LOWER EXTREMITY N/A 06/19/2020   Procedure: ABDOMINAL AORTOGRAM W/LOWER EXTREMITY;  Surgeon: Waynetta Sandy, MD;  Location: [place] [facility];  Service: Cardiovascular;  Laterality: N/A;   ABDOMINAL AORTOGRAM W/LOWER EXTREMITY Left 10/16/2020   Procedure: ABDOMINAL AORTOGRAM W/LOWER EXTREMITY;  Surgeon: Waynetta Sandy, MD;  Location: [place] [facility];  Service: Cardiovascular;  Laterality: Left;   ABDOMINAL AORTOGRAM W/LOWER EXTREMITY N/A 12/25/2020   Procedure: ABDOMINAL AORTOGRAM W/LOWER EXTREMITY;  Surgeon: Waynetta Sandy, MD;  Location: [place] [facility];  Service: Cardiovascular;  Laterality: N/A;   AMPUTATION Right 01/12/2021   Procedure: 1ST AMPUTATION RAY;  Surgeon: [name], DPM;  Location: [place];  Service: Podiatry;  Laterality: Right;   AMPUTATION Right 03/13/2021   Procedure: RIGHT BELOW KNEE AMPUTATION;  Surgeon: Waynetta Sandy, MD;  Location: [place];  Service: Vascular;  Laterality: Right;   AV FISTULA PLACEMENT Right 09/14/2019   Procedure: Right Arm Basilic Vein transposition;  Surgeon: Angelia Mould, MD;  Location: [place];  Service: Vascular;  Laterality: Right;   BONE BIOPSY Left 07/29/2020   Procedure: BONE BIOPSY;  Surgeon: [name], DPM;  Location: [place];  Service: Podiatry;  Laterality:  Left;  Need bone trephines and/or large bore Giamshidi   COLONOSCOPY     PERIPHERAL VASCULAR ATHERECTOMY Left 06/19/2020   Procedure: PERIPHERAL VASCULAR ATHERECTOMY;  Surgeon: Waynetta Sandy, MD;  Location: [place] [facility];  Service: Cardiovascular;  Laterality: Left;  SFA   PERIPHERAL VASCULAR ATHERECTOMY  12/25/2020   Procedure: PERIPHERAL VASCULAR ATHERECTOMY;  Surgeon: Waynetta Sandy, MD;  Location: [place] [facility];  Service:  Cardiovascular;;  Lt. PT - Laser Lt. SFA - Laser   PERIPHERAL VASCULAR BALLOON ANGIOPLASTY  12/25/2020   Procedure: PERIPHERAL VASCULAR BALLOON ANGIOPLASTY;  Surgeon: Waynetta Sandy, MD;  Location: [place] [facility];  Service: Cardiovascular;;  Lt. SFA and PT   UPPER GASTROINTESTINAL ENDOSCOPY  02/2019   Dr Benson [place]      Social History   Socioeconomic History   Marital status: Married    Spouse name: Not on file   Number of children: Not on file   Years of education: Not on file   Highest education level: Not on file  Occupational History   Occupation: Chief Financial Officer  Tobacco Use   Smoking status: Every Day    Packs/day: 0.30    Pack years: 0.00    Types: Cigarettes   Smokeless tobacco: Never  Vaping Use   Vaping Use: Never used  Substance and Sexual Activity   Alcohol use: No    Alcohol/week: 0.0 standard drinks   Drug use: No   Sexual activity: Not on file  Other Topics Concern   Not on file  Social History Narrative   Married.    Social Determinants of Health   Financial Resource Strain: Not on file  Food Insecurity: Not on file  Transportation Needs: Not on file  Physical Activity: Not on file  Stress: Not on file  Social Connections: Not on file  Intimate Partner Violence: Not on file    Current Outpatient Medications on File Prior to Visit  Medication Sig Dispense Refill   acidophilus (RISAQUAD) CAPS capsule Take 2 capsules by mouth daily.     aspirin EC 81 MG EC tablet Take 1 tablet (81 mg total) by mouth daily. Swallow whole. (Patient taking differently: Take 81 mg by mouth every morning. Swallow whole.) 30 tablet 11   clopidogrel (PLAVIX) 75 MG tablet Take 1 tablet (75 mg total) by mouth daily with breakfast. (Patient taking differently: Take 75 mg by mouth every morning.) 30 tablet 3   docusate sodium (COLACE) 100 MG capsule Take 100 mg by mouth every morning.     ferric citrate (AURYXIA) 1 GM 210 MG(Fe) tablet Take 210 mg by mouth 2 (two) times daily  with a meal.     Glucagon (GVOKE HYPOPEN 1-PACK) 1 MG/0.2ML SOAJ Inject 1 mg into the skin once as needed (low blood sugar).     Insulin Aspart, w/Niacinamide, (FIASP) 100 UNIT/ML SOLN For use in pump, total of 30 units per day 90 mL 0   Insulin Disposable Pump (OMNIPOD DASH 5 PACK PODS) MISC Use as instructed: use pod every 3 days. 30 each 3   iron sucrose in sodium chloride 0.9 % 100 mL Iron Sucrose (Venofer) Self Administer at Home     lidocaine-prilocaine (EMLA) cream Apply 1 application topically See admin instructions. Apply topically to port access one hour prior to dialysis on Sunday, Monday, Wednesday, Thursday     Methoxy PEG-Epoetin Beta (MIRCERA IJ) Inject into the skin.     multivitamin (RENA-VIT) TABS tablet Take 1 tablet by mouth every morning.  pantoprazole (PROTONIX) 40 MG tablet Take 1 tablet (40 mg total) by mouth daily.     rosuvastatin (CRESTOR) 10 MG tablet TAKE 1 TABLET BY MOUTH DAILY (Patient taking differently: Take 10 mg by mouth every morning.) 30 tablet 11   No current facility-administered medications on file prior to visit.    Allergies  Allergen Reactions   Amoxicillin-Pot Clavulanate Diarrhea and Other (See Comments)   Midodrine Other (See Comments)    Other reaction(s): Urinary Sensation   Tape Other (See Comments)    bandaids cause blistering    Family History  Problem Relation Age of Onset   Cancer Father    Diabetes Mother     BP 120/66 (BP Location: Left Arm, Patient Position: Sitting)   Ht 5\' 9"  (1.753 m)   Wt 158 lb 11.2 oz (72 kg)   BMI 23.44 kg/m    Review of Systems He denies hypoglycemia.      Objective:   Physical Exam      Assessment & Plan:  Insulin-requiring type 2 DM: uncontrolled

## 2021-07-03 NOTE — Telephone Encounter (Signed)
Received transferred phone call from pt's wife inquiring about fax request from [facility] in regards to her husband's prostheses. She stated she was not sure what was going on but [facility] had reported that they were waiting on some additional information from our office for insurance purposes. [facility] and s/w April, she stated that everything from [place] had been received as of 06/29/21 and they would be submitting to [facility] for approval. Stated she would reach out to pt with next steps today. - called pt's wife back and gave her this update. She was appreciative.

## 2021-07-04 DIAGNOSIS — Z992 Dependence on renal dialysis: Secondary | ICD-10-CM | POA: Diagnosis not present

## 2021-07-04 DIAGNOSIS — N2581 Secondary hyperparathyroidism of renal origin: Secondary | ICD-10-CM | POA: Diagnosis not present

## 2021-07-04 DIAGNOSIS — N186 End stage renal disease: Secondary | ICD-10-CM | POA: Diagnosis not present

## 2021-07-05 DIAGNOSIS — N186 End stage renal disease: Secondary | ICD-10-CM | POA: Diagnosis not present

## 2021-07-05 DIAGNOSIS — N2581 Secondary hyperparathyroidism of renal origin: Secondary | ICD-10-CM | POA: Diagnosis not present

## 2021-07-05 DIAGNOSIS — Z992 Dependence on renal dialysis: Secondary | ICD-10-CM | POA: Diagnosis not present

## 2021-07-08 DIAGNOSIS — Z992 Dependence on renal dialysis: Secondary | ICD-10-CM | POA: Diagnosis not present

## 2021-07-08 DIAGNOSIS — N186 End stage renal disease: Secondary | ICD-10-CM | POA: Diagnosis not present

## 2021-07-08 DIAGNOSIS — N2581 Secondary hyperparathyroidism of renal origin: Secondary | ICD-10-CM | POA: Diagnosis not present

## 2021-07-09 DIAGNOSIS — N2581 Secondary hyperparathyroidism of renal origin: Secondary | ICD-10-CM | POA: Diagnosis not present

## 2021-07-09 DIAGNOSIS — Z992 Dependence on renal dialysis: Secondary | ICD-10-CM | POA: Diagnosis not present

## 2021-07-09 DIAGNOSIS — N186 End stage renal disease: Secondary | ICD-10-CM | POA: Diagnosis not present

## 2021-07-10 ENCOUNTER — Telehealth: Payer: Self-pay

## 2021-07-10 NOTE — Telephone Encounter (Signed)
Patient's wife calls to report that patient fell this morning. He is s/p R BKA in March. He has a palm size lump on the back of his right thigh, and a lump the size of a small egg on his left calf. Reports that it is tender, but no trauma to the incision site or open wounds. Advised to ice the areas and rest - instructed to monitor the area and call back if any changes or further issues. She verbalized understanding.

## 2021-07-11 DIAGNOSIS — N2581 Secondary hyperparathyroidism of renal origin: Secondary | ICD-10-CM | POA: Diagnosis not present

## 2021-07-11 DIAGNOSIS — N186 End stage renal disease: Secondary | ICD-10-CM | POA: Diagnosis not present

## 2021-07-11 DIAGNOSIS — Z992 Dependence on renal dialysis: Secondary | ICD-10-CM | POA: Diagnosis not present

## 2021-07-12 DIAGNOSIS — Z992 Dependence on renal dialysis: Secondary | ICD-10-CM | POA: Diagnosis not present

## 2021-07-12 DIAGNOSIS — N186 End stage renal disease: Secondary | ICD-10-CM | POA: Diagnosis not present

## 2021-07-12 DIAGNOSIS — N2581 Secondary hyperparathyroidism of renal origin: Secondary | ICD-10-CM | POA: Diagnosis not present

## 2021-07-15 DIAGNOSIS — N186 End stage renal disease: Secondary | ICD-10-CM | POA: Diagnosis not present

## 2021-07-15 DIAGNOSIS — Z992 Dependence on renal dialysis: Secondary | ICD-10-CM | POA: Diagnosis not present

## 2021-07-15 DIAGNOSIS — N2581 Secondary hyperparathyroidism of renal origin: Secondary | ICD-10-CM | POA: Diagnosis not present

## 2021-07-16 DIAGNOSIS — R634 Abnormal weight loss: Secondary | ICD-10-CM | POA: Diagnosis not present

## 2021-07-16 DIAGNOSIS — K2100 Gastro-esophageal reflux disease with esophagitis, without bleeding: Secondary | ICD-10-CM | POA: Diagnosis not present

## 2021-07-16 DIAGNOSIS — R197 Diarrhea, unspecified: Secondary | ICD-10-CM | POA: Diagnosis not present

## 2021-07-17 DIAGNOSIS — R634 Abnormal weight loss: Secondary | ICD-10-CM | POA: Diagnosis not present

## 2021-07-17 DIAGNOSIS — R197 Diarrhea, unspecified: Secondary | ICD-10-CM | POA: Diagnosis not present

## 2021-07-17 DIAGNOSIS — K2100 Gastro-esophageal reflux disease with esophagitis, without bleeding: Secondary | ICD-10-CM | POA: Diagnosis not present

## 2021-07-18 DIAGNOSIS — Z992 Dependence on renal dialysis: Secondary | ICD-10-CM | POA: Diagnosis not present

## 2021-07-18 DIAGNOSIS — N186 End stage renal disease: Secondary | ICD-10-CM | POA: Diagnosis not present

## 2021-07-18 DIAGNOSIS — R197 Diarrhea, unspecified: Secondary | ICD-10-CM | POA: Diagnosis not present

## 2021-07-18 DIAGNOSIS — N2581 Secondary hyperparathyroidism of renal origin: Secondary | ICD-10-CM | POA: Diagnosis not present

## 2021-07-19 DIAGNOSIS — N186 End stage renal disease: Secondary | ICD-10-CM | POA: Diagnosis not present

## 2021-07-19 DIAGNOSIS — N2581 Secondary hyperparathyroidism of renal origin: Secondary | ICD-10-CM | POA: Diagnosis not present

## 2021-07-19 DIAGNOSIS — Z992 Dependence on renal dialysis: Secondary | ICD-10-CM | POA: Diagnosis not present

## 2021-07-19 DIAGNOSIS — Z89511 Acquired absence of right leg below knee: Secondary | ICD-10-CM | POA: Diagnosis not present

## 2021-07-22 DIAGNOSIS — N186 End stage renal disease: Secondary | ICD-10-CM | POA: Diagnosis not present

## 2021-07-22 DIAGNOSIS — N2581 Secondary hyperparathyroidism of renal origin: Secondary | ICD-10-CM | POA: Diagnosis not present

## 2021-07-22 DIAGNOSIS — Z992 Dependence on renal dialysis: Secondary | ICD-10-CM | POA: Diagnosis not present

## 2021-07-23 DIAGNOSIS — Z992 Dependence on renal dialysis: Secondary | ICD-10-CM | POA: Diagnosis not present

## 2021-07-23 DIAGNOSIS — N2581 Secondary hyperparathyroidism of renal origin: Secondary | ICD-10-CM | POA: Diagnosis not present

## 2021-07-23 DIAGNOSIS — N186 End stage renal disease: Secondary | ICD-10-CM | POA: Diagnosis not present

## 2021-07-24 ENCOUNTER — Encounter: Payer: Self-pay | Admitting: Physical Therapy

## 2021-07-24 ENCOUNTER — Ambulatory Visit (INDEPENDENT_AMBULATORY_CARE_PROVIDER_SITE_OTHER): Payer: BC Managed Care – PPO | Admitting: Physical Therapy

## 2021-07-24 ENCOUNTER — Other Ambulatory Visit: Payer: Self-pay

## 2021-07-24 DIAGNOSIS — M25672 Stiffness of left ankle, not elsewhere classified: Secondary | ICD-10-CM

## 2021-07-24 DIAGNOSIS — M21372 Foot drop, left foot: Secondary | ICD-10-CM

## 2021-07-24 DIAGNOSIS — Z9181 History of falling: Secondary | ICD-10-CM

## 2021-07-24 DIAGNOSIS — R2689 Other abnormalities of gait and mobility: Secondary | ICD-10-CM

## 2021-07-24 DIAGNOSIS — M6281 Muscle weakness (generalized): Secondary | ICD-10-CM

## 2021-07-24 DIAGNOSIS — R293 Abnormal posture: Secondary | ICD-10-CM

## 2021-07-24 DIAGNOSIS — R2681 Unsteadiness on feet: Secondary | ICD-10-CM | POA: Diagnosis not present

## 2021-07-24 DIAGNOSIS — M79661 Pain in right lower leg: Secondary | ICD-10-CM | POA: Diagnosis not present

## 2021-07-24 NOTE — Therapy (Signed)
[facility] [address] [place], [place], [address] Phone: [phone]   Fax:  [phone]  Physical Therapy Evaluation  Patient Details  Name: Duane Hall MRN: [id number] Date of Birth: Aug 21, 1956 Referring Provider (PT): [name], MD   Encounter Date: 07/24/2021   PT End of Session - 07/24/21 1646     Visit Number 1    Number of Visits 20    Date for PT Re-Evaluation 10/04/21    Authorization Type BCBS    Authorization Time Period OOP & Ded MET    PT Start Time 1510    PT Stop Time 1600    PT Time Calculation (min) 50 min    Equipment Utilized During Treatment Gait belt    Activity Tolerance Patient tolerated treatment well;Patient limited by pain    Behavior During Therapy [facility] for tasks assessed/performed             Past Medical History:  Diagnosis Date   Asthma    Cancer ([place])    Cataract    CKD (chronic kidney disease), stage III ([place])    Diabetes mellitus    Glaucoma    Vascular insufficiency 05/2020    Past Surgical History:  Procedure Laterality Date   ABDOMINAL AORTOGRAM W/LOWER EXTREMITY N/A 06/19/2020   Procedure: ABDOMINAL AORTOGRAM W/LOWER EXTREMITY;  Surgeon: Waynetta Sandy, MD;  Location: [place] [facility];  Service: Cardiovascular;  Laterality: N/A;   ABDOMINAL AORTOGRAM W/LOWER EXTREMITY Left 10/16/2020   Procedure: ABDOMINAL AORTOGRAM W/LOWER EXTREMITY;  Surgeon: Waynetta Sandy, MD;  Location: [place] [facility];  Service: Cardiovascular;  Laterality: Left;   ABDOMINAL AORTOGRAM W/LOWER EXTREMITY N/A 12/25/2020   Procedure: ABDOMINAL AORTOGRAM W/LOWER EXTREMITY;  Surgeon: Waynetta Sandy, MD;  Location: [place] [facility];  Service: Cardiovascular;  Laterality: N/A;   AMPUTATION Right 01/12/2021   Procedure: 1ST AMPUTATION RAY;  Surgeon: [name], DPM;  Location: [place];  Service: Podiatry;  Laterality: Right;   AMPUTATION Right 03/13/2021   Procedure: RIGHT  BELOW KNEE AMPUTATION;  Surgeon: Waynetta Sandy, MD;  Location: [place];  Service: Vascular;  Laterality: Right;   AV FISTULA PLACEMENT Right 09/14/2019   Procedure: Right Arm Basilic Vein transposition;  Surgeon: Angelia Mould, MD;  Location: [place];  Service: Vascular;  Laterality: Right;   BONE BIOPSY Left 07/29/2020   Procedure: BONE BIOPSY;  Surgeon: [name], DPM;  Location: [place];  Service: Podiatry;  Laterality: Left;  Need bone trephines and/or large bore Giamshidi   COLONOSCOPY     PERIPHERAL VASCULAR ATHERECTOMY Left 06/19/2020   Procedure: PERIPHERAL VASCULAR ATHERECTOMY;  Surgeon: Waynetta Sandy, MD;  Location: [place] [facility];  Service: Cardiovascular;  Laterality: Left;  SFA   PERIPHERAL VASCULAR ATHERECTOMY  12/25/2020   Procedure: PERIPHERAL VASCULAR ATHERECTOMY;  Surgeon: Waynetta Sandy, MD;  Location: [place] [facility];  Service: Cardiovascular;;  Lt. PT - Laser Lt. SFA - Laser   PERIPHERAL VASCULAR BALLOON ANGIOPLASTY  12/25/2020   Procedure: PERIPHERAL VASCULAR BALLOON ANGIOPLASTY;  Surgeon: Waynetta Sandy, MD;  Location: [place] [facility];  Service: Cardiovascular;;  Lt. SFA and PT   UPPER GASTROINTESTINAL ENDOSCOPY  02/2019   Dr Benson [place]      There were no vitals filed for this visit.    Subjective Assessment - 07/24/21 1516     Subjective This 65yo male was referred to PT by [name], MD with Right Below Knee Amputation on 03/13/2021.  He has had ecoli of bowels for last 3 weeks.  He received prsothesis 07/19/2021.    Patient is accompained by: Family member    Pertinent History ESRD home dialysis, DM, PVD, asthma, glaucoma    Patient Stated Goals Walk with prosthesis at community level.  Get around house without w/c.    Currently in Pain? No/denies                [facility] PT Assessment - 07/24/21 1515       Assessment   Medical Diagnosis Right Transtibial Amputation    Referring Provider (PT)  [name], MD    Onset Date/Surgical Date 07/19/21   prosthesis delivery   Hand Dominance Right    Prior Therapy inpatient rehab      Precautions   Precautions Fall;Other (comment)   no BP RUE     Balance Screen   Has the patient fallen in the past 6 months Yes    How many times? 1   bruise right thigh, thinks w/c not locked   Has the patient had a decrease in activity level because of a fear of falling?  No    Is the patient reluctant to leave their home because of a fear of falling?  No   uses w/c     Home Environment   Living Environment Private residence    Living Arrangements Spouse/significant other   2 dogs 60-70#,   Type of [place] entrance   2nd entrance 5 steps wide double [place] One level    Home Equipment Wheelchair - manual;Walker - 2 wheels;Shower seat - built in      Prior Function   Level of Independence Independent;Independent with household mobility without device;Independent with community mobility without device    Vocation On disability    Leisure mystery tv, building microcomputers      Posture/Postural Control   Posture/Postural Control Postural limitations    Postural Limitations Rounded Shoulders;Forward head;Flexed trunk;Weight shift left      ROM / Strength   AROM / PROM / Strength PROM;Strength      PROM   Overall PROM Comments hamstring tightness bilaterally -30* but when positioned with no hamstring tension he had full knee extension    Left Ankle Dorsiflexion -20   seated knee flexed     Strength   Overall Strength Deficits    Strength Assessment Site Hip;Knee;Ankle    Right Hip Flexion 4/5    Right Hip Extension 3/5   tested in sitting   Right Hip ABduction 3/5   tested in sitting   Left Hip Flexion 4/5    Left Hip Extension 3/5   tested in sitting   Left Hip ABduction 3/5   tested in sitting   Right Knee Flexion 3/5   tested in sitting   Right Knee Extension 4-/5    Left Knee Flexion 3/5    tested in sitting   Left Knee Extension 4/5    Left Ankle Dorsiflexion 2-/5      Transfers   Transfers Sit to Stand;Stand to Sit    Sit to Stand 5: Supervision;With upper extremity assist;With armrests;From chair/3-in-1;Other (comment)   needs UE support to stabilize   Stand to Sit 5: Supervision;With upper extremity assist;With armrests;To chair/3-in-1;Other (comment)   needs UE support to stabilize     Ambulation/Gait   Ambulation/Gait Yes    Ambulation/Gait Assistance 5: Supervision    Ambulation/Gait Assistance Details  excessive UE weight bearing    Ambulation Distance (Feet) 70 Feet    Assistive device Rolling walker;Prosthesis    Gait Pattern Step-to pattern;Decreased step length - left;Decreased stance time - right;Decreased hip/knee flexion - right;Decreased dorsiflexion - left;Decreased weight shift to right;Left steppage;Right flexed knee in stance;Antalgic;Lateral hip instability;Trunk flexed;Poor foot clearance - left   RLE adduction   Ambulation Surface Level;Indoor      Standardized Balance Assessment   Standardized Balance Assessment Berg Balance Test      Berg Balance Test   Sit to Stand Needs minimal aid to stand or to stabilize   task with RW support = 3   Standing Unsupported Needs several tries to stand 30 seconds unsupported   task with RW support = 4   Sitting with Back Unsupported but Feet Supported on Floor or Stool Able to sit safely and securely 2 minutes    Stand to Sit Sits independently, has uncontrolled descent   task with RW support = 3   Transfers Able to transfer safely, definite need of hands    Standing Unsupported with Eyes Closed Needs help to keep from falling   task with RW support = 3   Standing Unsupported with Feet Together Needs help to attain position and unable to hold for 15 seconds   task with RW support = 3   From Standing, Reach Forward with Outstretched Arm Loses balance while trying/requires external support   task with RW support = 1    From Standing Position, Pick up Object from Floor Unable to try/needs assist to keep balance   task with RW support = 1   From Standing Position, Turn to Look Behind Over each Shoulder Needs assist to keep from losing balance and falling   task with RW support = 2   Turn 360 Degrees Needs assistance while turning   task with RW support = 1   Standing Unsupported, Alternately Place Feet on Step/Stool Needs assistance to keep from falling or unable to try   task with RW support = 0   Standing Unsupported, One Foot in [employer] balance while stepping or standing   task with RW support = 2   Standing on One Leg Unable to try or needs assist to prevent fall   task with RW support = 1   Total Score 10    Berg comment: Tasks of Berg with RW support = 31/56    BERG  < 36 high risk for falls (close to 100%) 46-51 moderate (>50%)   37-45 significant (>80%) 52-55 lower (> 25%)             Prosthetics Assessment - 07/24/21 1515       Prosthetics   Prosthetic Care Dependent with Skin check;Residual limb care;Care of non-amputated limb;Prosthetic cleaning;Ply sock cleaning;Correct ply sock adjustment;Proper wear schedule/adjustment;Proper weight-bearing schedule/adjustment    Donning prosthesis  Min assist    Doffing prosthesis  Supervision    Current prosthetic wear tolerance (days/week)  3 of 6 days since delivery    Current prosthetic wear tolerance (#hours/day)  1-3 hrs 1x/day    Current prosthetic weight-bearing tolerance (hours/day)  patient tolerated standing for 5 min with partial weight on prosthesis with residual limb pain ~4/10    Edema pitting    Residual limb condition  no open areas, no hair growth, normal color & temperature, cylinderical shape    Prosthesis Description silicon liner with shuttle pin liner, 2nd pelite foam liner, Flexible keel foot  Objective measurements completed on examination: See above findings.       [facility] Adult PT  Treatment/Exercise - 07/24/21 1515       Prosthetics   Prosthetic Care Comments  initiate wear 2hrs 2x/day with plans to increase q5-7 days    Education Provided Skin check;Residual limb care;Prosthetic cleaning;Proper Donning;Proper wear schedule/adjustment;Other (comment)   see prosthetic care comments   Person(s) Educated Patient;Spouse    Education Method Explanation;Demonstration;Tactile cues;Verbal cues    Education Method Verbalized understanding;Needs further instruction                      PT Short Term Goals - 07/24/21 1702       PT SHORT TERM GOAL #1   Title Patient donnes prosthesis modified independent & verbalizes proper cleaning.    Time 4    Period Weeks    Status New    Target Date 08/24/21      PT SHORT TERM GOAL #2   Title Patient tolerates prosthesis >8 hrs total /day without skin issues or limb pain <2/10 after standing.    Time 4    Period Weeks    Status New    Target Date 08/24/21      PT SHORT TERM GOAL #3   Title Patient able to pick up items from floor & reach 10" with RW support safely.    Time 4    Period Weeks    Status New    Target Date 08/24/21      PT SHORT TERM GOAL #4   Title Patient ambulates 150' with RW & prosthesis with supervision    Time 4    Period Weeks    Status New    Target Date 08/24/21      PT SHORT TERM GOAL #5   Title Patient negotiates ramps & curbs with RW & prosthesis with minA.    Time 4    Period Weeks    Status New    Target Date 08/24/21               PT Long Term Goals - 07/24/21 1659       PT LONG TERM GOAL #1   Title Patient demonstrates & verbalized understanding of prosthetic care to enable safe utilization of prosthesis.    Time 10    Period Weeks    Status New    Target Date 10/04/21      PT LONG TERM GOAL #2   Title Patient tolerates prosthesis wear >90% of awake hours without skin or limb pain issues.    Time 10    Period Weeks    Status New    Target Date 10/04/21       PT LONG TERM GOAL #3   Title Berg Balance >/= 36/56 to indicate lower fall risk    Time 10    Period Weeks    Status New    Target Date 10/04/21      PT LONG TERM GOAL #4   Title Patient ambulates 300' with LRAD & prosthesis modified independent    Time 10    Period Weeks    Status New    Target Date 10/04/21      PT LONG TERM GOAL #5   Title Patient negotiates ramps, curbs & stairs with LRAD & prosthesis modified independent.    Time 10    Period Weeks    Status New    Target Date 10/04/21  Plan - 07/24/21 1651     Clinical Impression Statement This 65yo male underwent a left Transtibial Amputation on 03/13/2021 and received his first prosthesis on 07/19/2021.  He has left ankle decreased range and weakness with foot drop. He has been limited in mobility for several months with resulting significant deconditioning.  He is dependent in proper prosthetic care which increases risk of skin issues and has some wounds on his residual limb.  Patient has only worn prosthesis for 3 of 6 days since delivery for 1-3 hours.  Berg Balance score of 10/56 & tasks of Berg with walker support 31/56 and requires assistance without UE support indicates high fall risk.  Patient's prosthetic gait requires assistance, has significant deviations including excessive UE weight bearing indicating high fall risk. Patient would benefit from skilled PT to improve function & safety with her prosthesis.    Personal Factors and Comorbidities Comorbidity 3+;Time since onset of injury/illness/exacerbation    Comorbidities ESRD home dialysis, DM, PVD, asthma, glaucoma    Examination-Activity Limitations Lift;Locomotion Level;Squat;Stairs;Stand;Transfers    Examination-Participation Restrictions Community Activity    Stability/Clinical Decision Making Evolving/Moderate complexity    Clinical Decision Making Moderate    Rehab Potential Good    PT Frequency 2x / week    PT Duration Other  (comment)   10 weeks   PT Treatment/Interventions ADLs/Self Care Home Management;DME Instruction;Gait training;Stair training;Functional mobility training;Therapeutic activities;Therapeutic exercise;Balance training;Neuromuscular re-education;Patient/family education;Prosthetic Training;Manual techniques;Passive range of motion;Vestibular    PT Next Visit Plan review prosthetic care, set up HEP at sink, prosthetic gait with RW including ramps & curbs    Consulted and Agree with Plan of Care Patient;Family member/caregiver    Family Member Consulted wife             Patient will benefit from skilled therapeutic intervention in order to improve the following deficits and impairments:  Abnormal gait, Decreased activity tolerance, Decreased balance, Decreased endurance, Decreased knowledge of use of DME, Decreased mobility, Decreased range of motion, Decreased strength, Increased edema, Impaired flexibility, Postural dysfunction, Prosthetic Dependency, Pain  Visit Diagnosis: Other abnormalities of gait and mobility  Unsteadiness on feet  Stiffness of left ankle, not elsewhere classified  Pain in right lower leg  History of falling  Muscle weakness (generalized)  Foot drop, left  Abnormal posture     Problem List Patient Active Problem List   Diagnosis Date Noted   Degeneration of lumbar intervertebral disc 05/18/2021   Drug induced constipation    Thrombocytopenia (HCC)    Labile blood pressure    Post-operative pain    S/P BKA (below knee amputation) unilateral, right (HCC)    Right BKA infection (HCC)    Gangrene of right foot ([place]) 03/16/2021   Acquired absence of right leg below knee ([place]) 03/14/2021   Peripheral arterial disease (HCC)    Gangrene (HCC)    Osteomyelitis of right foot ([place])    Sepsis ([place]) 01/11/2021   Cellulitis of right foot 01/11/2021   Lumbar spondylosis 12/26/2020   Lumbar radiculopathy 10/05/2020   Left atrial dilation 08/12/2020   Lumbar  back pain 08/12/2020   Acute osteomyelitis of left calcaneus ([place])    Bilateral pseudophakia 07/28/2020   Stable proliferative diabetic retinopathy of both eyes associated with type 2 diabetes mellitus ([place]) 07/28/2020   Diabetic foot ulcer ([place]) 07/27/2020   ESRD on hemodialysis ([place]) 07/27/2020   Gastroparesis diabeticorum ([place]) 07/02/2020   Vascular insufficiency of extremity 06/18/2020   Awaiting transplantation of kidney 06/18/2020   Foot ulcer,  left ([place]) 06/18/2020   Dependence on renal dialysis ([place]) 05/01/2020   Cramp and spasm 05/01/2020   Hemodialysis-associated hypotension 05/01/2020   Aortic atherosclerosis ([place]) 02/28/2020   Hypoglycemia, unspecified 12/20/2019   Iron deficiency anemia, unspecified 11/17/2019   Allergy, unspecified, initial encounter 10/18/2019   Type 2 diabetes mellitus with diabetic peripheral angiopathy without gangrene ([name]) 10/16/2019   Other disorders of phosphorus metabolism 10/14/2019   Secondary hyperparathyroidism of renal origin ([place]) 16/57/9038   Complication of vascular dialysis catheter 10/09/2019   Hypercalcemia 10/07/2019   Hyperlipidemia, unspecified 10/07/2019   Tobacco use 10/07/2019   Unspecified glaucoma 10/07/2019   Encounter for immunization 10/06/2019   Coagulation defect, unspecified ([place]) 10/05/2019   ESRD (end stage renal disease) ([place]) 10/05/2019   Hypokalemia 09/09/2019   Other emphysema ([place]) 11/23/2018   Benign prostatic hyperplasia with weak urinary stream 11/18/2018   Pure hypercholesterolemia 11/18/2018   Uncontrolled type 2 diabetes mellitus with hyperglycemia ([place]) 11/18/2018   Diabetic retinopathy ([place]) 08/25/2015   Claudication ([place]) 04/07/2015   Anemia of chronic disease 09/12/2014   Hepatitis C 10/15/2013   Pain in joint, shoulder region 02/10/2013   Diabetes mellitus type 2 in nonobese [facility]) 02/24/2012   Hypertension 02/24/2012   Asthma 02/24/2012    Jamey Reas, PT, DPT 07/24/2021, 5:05 PM  [facility] [address] [place], [place], [address] Phone: [phone]   Fax:  [phone]  Name: Duane Hall MRN: [id number] Date of Birth: [birth year]

## 2021-07-25 ENCOUNTER — Encounter: Payer: Self-pay | Admitting: Physical Therapy

## 2021-07-25 DIAGNOSIS — N186 End stage renal disease: Secondary | ICD-10-CM | POA: Diagnosis not present

## 2021-07-25 DIAGNOSIS — N2581 Secondary hyperparathyroidism of renal origin: Secondary | ICD-10-CM | POA: Diagnosis not present

## 2021-07-25 DIAGNOSIS — Z992 Dependence on renal dialysis: Secondary | ICD-10-CM | POA: Diagnosis not present

## 2021-07-26 DIAGNOSIS — Z89511 Acquired absence of right leg below knee: Secondary | ICD-10-CM | POA: Diagnosis not present

## 2021-07-26 DIAGNOSIS — N186 End stage renal disease: Secondary | ICD-10-CM | POA: Diagnosis not present

## 2021-07-26 DIAGNOSIS — Z992 Dependence on renal dialysis: Secondary | ICD-10-CM | POA: Diagnosis not present

## 2021-07-26 DIAGNOSIS — N2581 Secondary hyperparathyroidism of renal origin: Secondary | ICD-10-CM | POA: Diagnosis not present

## 2021-07-29 DIAGNOSIS — N2581 Secondary hyperparathyroidism of renal origin: Secondary | ICD-10-CM | POA: Diagnosis not present

## 2021-07-29 DIAGNOSIS — N186 End stage renal disease: Secondary | ICD-10-CM | POA: Diagnosis not present

## 2021-07-29 DIAGNOSIS — Z992 Dependence on renal dialysis: Secondary | ICD-10-CM | POA: Diagnosis not present

## 2021-07-29 DIAGNOSIS — E1129 Type 2 diabetes mellitus with other diabetic kidney complication: Secondary | ICD-10-CM | POA: Diagnosis not present

## 2021-07-30 DIAGNOSIS — Z992 Dependence on renal dialysis: Secondary | ICD-10-CM | POA: Diagnosis not present

## 2021-07-30 DIAGNOSIS — N186 End stage renal disease: Secondary | ICD-10-CM | POA: Diagnosis not present

## 2021-07-30 DIAGNOSIS — N2581 Secondary hyperparathyroidism of renal origin: Secondary | ICD-10-CM | POA: Diagnosis not present

## 2021-07-31 ENCOUNTER — Encounter: Payer: Self-pay | Admitting: Physical Therapy

## 2021-07-31 ENCOUNTER — Other Ambulatory Visit: Payer: Self-pay

## 2021-07-31 ENCOUNTER — Ambulatory Visit (INDEPENDENT_AMBULATORY_CARE_PROVIDER_SITE_OTHER): Payer: BC Managed Care – PPO | Admitting: Physical Therapy

## 2021-07-31 DIAGNOSIS — M79661 Pain in right lower leg: Secondary | ICD-10-CM | POA: Diagnosis not present

## 2021-07-31 DIAGNOSIS — M21372 Foot drop, left foot: Secondary | ICD-10-CM

## 2021-07-31 DIAGNOSIS — R2681 Unsteadiness on feet: Secondary | ICD-10-CM

## 2021-07-31 DIAGNOSIS — M25672 Stiffness of left ankle, not elsewhere classified: Secondary | ICD-10-CM

## 2021-07-31 DIAGNOSIS — R2689 Other abnormalities of gait and mobility: Secondary | ICD-10-CM | POA: Diagnosis not present

## 2021-07-31 DIAGNOSIS — M6281 Muscle weakness (generalized): Secondary | ICD-10-CM

## 2021-07-31 NOTE — Therapy (Signed)
[facility] [address] [place], [place], [address] Phone: [phone]   Fax:  [phone]  Physical Therapy Treatment  Patient Details  Name: Duane Hall MRN: [id number] Date of Birth: 20-Feb-1956 Referring Provider (PT): [name], MD   Encounter Date: 07/31/2021   PT End of Session - 07/31/21 1631     Visit Number 2    Number of Visits 20    Date for PT Re-Evaluation 10/04/21    Authorization Type BCBS    Authorization Time Period OOP & Ded MET    PT Start Time 1510    PT Stop Time 1605    PT Time Calculation (min) 55 min    Equipment Utilized During Treatment Gait belt    Activity Tolerance Patient tolerated treatment well;Patient limited by pain    Behavior During Therapy [facility] for tasks assessed/performed             Past Medical History:  Diagnosis Date   Asthma    Cancer ([place])    Cataract    CKD (chronic kidney disease), stage III ([place])    Diabetes mellitus    Glaucoma    Vascular insufficiency 05/2020    Past Surgical History:  Procedure Laterality Date   ABDOMINAL AORTOGRAM W/LOWER EXTREMITY N/A 06/19/2020   Procedure: ABDOMINAL AORTOGRAM W/LOWER EXTREMITY;  Surgeon: Waynetta Sandy, MD;  Location: [place] [facility];  Service: Cardiovascular;  Laterality: N/A;   ABDOMINAL AORTOGRAM W/LOWER EXTREMITY Left 10/16/2020   Procedure: ABDOMINAL AORTOGRAM W/LOWER EXTREMITY;  Surgeon: Waynetta Sandy, MD;  Location: [place] [facility];  Service: Cardiovascular;  Laterality: Left;   ABDOMINAL AORTOGRAM W/LOWER EXTREMITY N/A 12/25/2020   Procedure: ABDOMINAL AORTOGRAM W/LOWER EXTREMITY;  Surgeon: Waynetta Sandy, MD;  Location: [place] [facility];  Service: Cardiovascular;  Laterality: N/A;   AMPUTATION Right 01/12/2021   Procedure: 1ST AMPUTATION RAY;  Surgeon: [name], DPM;  Location: [place];  Service: Podiatry;  Laterality: Right;   AMPUTATION Right 03/13/2021   Procedure: RIGHT BELOW  KNEE AMPUTATION;  Surgeon: Waynetta Sandy, MD;  Location: [place];  Service: Vascular;  Laterality: Right;   AV FISTULA PLACEMENT Right 09/14/2019   Procedure: Right Arm Basilic Vein transposition;  Surgeon: Angelia Mould, MD;  Location: [place];  Service: Vascular;  Laterality: Right;   BONE BIOPSY Left 07/29/2020   Procedure: BONE BIOPSY;  Surgeon: [name], DPM;  Location: [place];  Service: Podiatry;  Laterality: Left;  Need bone trephines and/or large bore Giamshidi   COLONOSCOPY     PERIPHERAL VASCULAR ATHERECTOMY Left 06/19/2020   Procedure: PERIPHERAL VASCULAR ATHERECTOMY;  Surgeon: Waynetta Sandy, MD;  Location: [place] [facility];  Service: Cardiovascular;  Laterality: Left;  SFA   PERIPHERAL VASCULAR ATHERECTOMY  12/25/2020   Procedure: PERIPHERAL VASCULAR ATHERECTOMY;  Surgeon: Waynetta Sandy, MD;  Location: [place] [facility];  Service: Cardiovascular;;  Lt. PT - Laser Lt. SFA - Laser   PERIPHERAL VASCULAR BALLOON ANGIOPLASTY  12/25/2020   Procedure: PERIPHERAL VASCULAR BALLOON ANGIOPLASTY;  Surgeon: Waynetta Sandy, MD;  Location: [place] [facility];  Service: Cardiovascular;;  Lt. SFA and PT   UPPER GASTROINTESTINAL ENDOSCOPY  02/2019   Dr Benson Norway      There were no vitals filed for this visit.   Subjective Assessment - 07/31/21 1511     Subjective He has worn prosthesis daily 2hrs 2x/day except only 1x yesterday.  He walked with RW with wife's supervision 1x/day.  Patient is accompained by: Family member    Pertinent History ESRD home dialysis, DM, PVD, asthma, glaucoma    Patient Stated Goals Walk with prosthesis at community level.  Get around house without w/c.    Currently in Pain? No/denies                               [facility] Adult PT Treatment/Exercise - 07/31/21 1510       Transfers   Transfers Sit to Stand;Stand to Sit    Sit to Stand 5: Supervision;With upper extremity assist;With  armrests;From chair/3-in-1;Other (comment)   needs UE support to stabilize   Sit to Stand Details Visual cues/gestures for sequencing;Verbal cues for technique    Stand to Sit 5: Supervision;With upper extremity assist;With armrests;To chair/3-in-1;Other (comment)   needs UE support to stabilize   Stand to Sit Details (indicate cue type and reason) Verbal cues for technique;Visual cues/gestures for sequencing      Ambulation/Gait   Ambulation/Gait Yes    Ambulation/Gait Assistance 5: Supervision    Ambulation/Gait Assistance Details no LOB or unsteadiness noted on level surfaces short distances with RW & prosthesis.  He appears safe to walk short distances in home setting.    Ambulation Distance (Feet) 70 Feet    Assistive device Rolling walker;Prosthesis    Stairs Yes    Stairs Assistance 4: Min guard    Stairs Assistance Details (indicate cue type and reason) PT demo & verbal cues on technique with 2 rails & with single rail side stepping with BUE on rail.    Stair Management Technique One rail Left;Sideways;Step to pattern    Number of Stairs 4    Height of Stairs 6    Ramp 4: Min assist   RW & TTA prosthesis   Ramp Details (indicate cue type and reason) demo, verbal & tactile cues on technique    Curb 4: Min assist   RW & TTA prosthesis   Curb Details (indicate cue type and reason) demo, verbal & tactile cues on technique      Prosthetics   Prosthetic Care Comments  driving options with right TTA prosthesis - see pt instructions    Current prosthetic wear tolerance (days/week)  daily    Current prosthetic wear tolerance (#hours/day)  2 hrs 2x/day - increase to 3hrs 2x/day    Current prosthetic weight-bearing tolerance (hours/day)  patient tolerated standing for 5 min with partial weight on prosthesis with residual limb pain ~4/10    Edema pitting    Residual limb condition  no open areas, no hair growth, normal color & temperature, cylinderical shape    Education Provided Prosthetic  cleaning;Proper Donning;Proper wear schedule/adjustment;Other (comment)   see prosthetic care comments   Person(s) Educated Patient;Spouse    Education Method Explanation;Demonstration;Tactile cues;Verbal cues;Handout    Education Method Verbalized understanding;Tactile cues required;Verbal cues required;Needs further instruction    Donning Prosthesis Supervision                      PT Short Term Goals - 07/31/21 1632       PT SHORT TERM GOAL #1   Title Patient donnes prosthesis modified independent & verbalizes proper cleaning.    Time 4    Period Weeks    Status On-going    Target Date 08/24/21      PT SHORT TERM GOAL #2   Title Patient tolerates prosthesis >8 hrs total /day without  skin issues or limb pain <2/10 after standing.    Time 4    Period Weeks    Status On-going    Target Date 08/24/21      PT SHORT TERM GOAL #3   Title Patient able to pick up items from floor & reach 10" with RW support safely.    Time 4    Period Weeks    Status On-going    Target Date 08/24/21      PT SHORT TERM GOAL #4   Title Patient ambulates 150' with RW & prosthesis with supervision    Time 4    Period Weeks    Status On-going    Target Date 08/24/21      PT SHORT TERM GOAL #5   Title Patient negotiates ramps & curbs with RW & prosthesis with minA.    Time 4    Period Weeks    Status On-going    Target Date 08/24/21               PT Long Term Goals - 07/31/21 1632       PT LONG TERM GOAL #1   Title Patient demonstrates & verbalized understanding of prosthetic care to enable safe utilization of prosthesis.    Time 10    Period Weeks    Status On-going    Target Date 10/04/21      PT LONG TERM GOAL #2   Title Patient tolerates prosthesis wear >90% of awake hours without skin or limb pain issues.    Time 10    Period Weeks    Status On-going    Target Date 10/04/21      PT LONG TERM GOAL #3   Title Berg Balance >/= 36/56 to indicate lower fall risk     Time 10    Period Weeks    Status On-going    Target Date 10/04/21      PT LONG TERM GOAL #4   Title Patient ambulates 300' with LRAD & prosthesis modified independent    Time 10    Period Weeks    Status On-going    Target Date 10/04/21      PT LONG TERM GOAL #5   Title Patient negotiates ramps, curbs & stairs with LRAD & prosthesis modified independent.    Time 10    Period Weeks    Status On-going    Target Date 10/04/21                   Plan - 07/31/21 1633     Clinical Impression Statement Per patient & his wife request, PT instructed in options for driving with right Transtibial Amputation. They appear to understand options.  Patient appears safe to walk short distances with RW on level surfaces without assistance like his home.  PT instructed in negotiating ramps, curbs & stairs and they appear to have a general understanding.    Personal Factors and Comorbidities Comorbidity 3+;Time since onset of injury/illness/exacerbation    Comorbidities ESRD home dialysis, DM, PVD, asthma, glaucoma    Examination-Activity Limitations Lift;Locomotion Level;Squat;Stairs;Stand;Transfers    Examination-Participation Restrictions Community Activity    Stability/Clinical Decision Making Evolving/Moderate complexity    Rehab Potential Good    PT Frequency 2x / week    PT Duration Other (comment)   10 weeks   PT Treatment/Interventions ADLs/Self Care Home Management;DME Instruction;Gait training;Stair training;Functional mobility training;Therapeutic activities;Therapeutic exercise;Balance training;Neuromuscular re-education;Patient/family education;Prosthetic Training;Manual techniques;Passive range of motion;Vestibular    PT  Next Visit Plan review prosthetic care, set up HEP at sink, instruct in walking program to progress activity tolerance    Consulted and Agree with Plan of Care Patient;Family member/caregiver    Family Member Consulted wife             Patient will  benefit from skilled therapeutic intervention in order to improve the following deficits and impairments:  Abnormal gait, Decreased activity tolerance, Decreased balance, Decreased endurance, Decreased knowledge of use of DME, Decreased mobility, Decreased range of motion, Decreased strength, Increased edema, Impaired flexibility, Postural dysfunction, Prosthetic Dependency, Pain  Visit Diagnosis: Unsteadiness on feet  Other abnormalities of gait and mobility  Stiffness of left ankle, not elsewhere classified  Pain in right lower leg  Muscle weakness (generalized)  Foot drop, left     Problem List Patient Active Problem List   Diagnosis Date Noted   Degeneration of lumbar intervertebral disc 05/18/2021   Drug induced constipation    Thrombocytopenia (HCC)    Labile blood pressure    Post-operative pain    S/P BKA (below knee amputation) unilateral, right (HCC)    Right BKA infection (HCC)    Gangrene of right foot ([place]) 03/16/2021   Acquired absence of right leg below knee ([place]) 03/14/2021   Peripheral arterial disease (HCC)    Gangrene ([place])    Osteomyelitis of right foot ([place])    Sepsis ([place]) 01/11/2021   Cellulitis of right foot 01/11/2021   Lumbar spondylosis 12/26/2020   Lumbar radiculopathy 10/05/2020   Left atrial dilation 08/12/2020   Lumbar back pain 08/12/2020   Acute osteomyelitis of left calcaneus ([place])    Bilateral pseudophakia 07/28/2020   Stable proliferative diabetic retinopathy of both eyes associated with type 2 diabetes mellitus ([place]) 07/28/2020   Diabetic foot ulcer ([place]) 07/27/2020   ESRD on hemodialysis ([place]) 07/27/2020   Gastroparesis diabeticorum ([place]) 07/02/2020   Vascular insufficiency of extremity 06/18/2020   Awaiting transplantation of kidney 06/18/2020   Foot ulcer, left ([place]) 06/18/2020   Dependence on renal dialysis ([place]) 05/01/2020   Cramp and spasm 05/01/2020   Hemodialysis-associated hypotension 05/01/2020   Aortic atherosclerosis  ([place]) 02/28/2020   Hypoglycemia, unspecified 12/20/2019   Iron deficiency anemia, unspecified 11/17/2019   Allergy, unspecified, initial encounter 10/18/2019   Type 2 diabetes mellitus with diabetic peripheral angiopathy without gangrene ([place]) 10/16/2019   Other disorders of phosphorus metabolism 10/14/2019   Secondary hyperparathyroidism of renal origin ([place]) 97/94/8016   Complication of vascular dialysis catheter 10/09/2019   Hypercalcemia 10/07/2019   Hyperlipidemia, unspecified 10/07/2019   Tobacco use 10/07/2019   Unspecified glaucoma 10/07/2019   Encounter for immunization 10/06/2019   Coagulation defect, unspecified ([place]) 10/05/2019   ESRD (end stage renal disease) ([place]) 10/05/2019   Hypokalemia 09/09/2019   Other emphysema ([place]) 11/23/2018   Benign prostatic hyperplasia with weak urinary stream 11/18/2018   Pure hypercholesterolemia 11/18/2018   Uncontrolled type 2 diabetes mellitus with hyperglycemia ([place]) 11/18/2018   Diabetic retinopathy ([place]) 08/25/2015   Claudication ([place]) 04/07/2015   Anemia of chronic disease 09/12/2014   Hepatitis C 10/15/2013   Pain in joint, shoulder region 02/10/2013   Diabetes mellitus type 2 in nonobese [facility]) 02/24/2012   Hypertension 02/24/2012   Asthma 02/24/2012    Jamey Reas, PT, DPT 07/31/2021, 4:36 PM  [facility] [address] [place], [place], [address] Phone: [phone]   Fax:  [phone]  Name: Duane Hall MRN: [id number] Date of Birth: 08/05/1956

## 2021-07-31 NOTE — Patient Instructions (Signed)
Driving options with Below Knee Prosthesis   Option 1:  Remove prosthesis and use left leg to crossover drive Option 2:  2 Foot driving - right foot on gas & left foot on brake.  Prosthetic motion is leg press motion not ankle pump.  Focus is on heel pressing out not on toes/forefoot.   Option 3: Use prosthesis on both gas & brake.  Continue to focus on heel motion & position.  Option 4: Left foot accelerator.  Consider portable left foot accelerator ( http://plfa.org ) as allows to switch between cars or remove for other drivers.  Left foot both gas & brake.  Using cruise control to accelerate & decelerate or resume speed after stopping can safe some leg work / motion.   Practice initially in large empty parking lot. Stay in middle not near curbs. Practice turning & backing into parking spot on right & left. Parallel park.  Back up.  Quick stops. More about learning foot work not staying in motion.   

## 2021-08-01 DIAGNOSIS — N186 End stage renal disease: Secondary | ICD-10-CM | POA: Diagnosis not present

## 2021-08-01 DIAGNOSIS — N2581 Secondary hyperparathyroidism of renal origin: Secondary | ICD-10-CM | POA: Diagnosis not present

## 2021-08-01 DIAGNOSIS — Z992 Dependence on renal dialysis: Secondary | ICD-10-CM | POA: Diagnosis not present

## 2021-08-02 DIAGNOSIS — N186 End stage renal disease: Secondary | ICD-10-CM | POA: Diagnosis not present

## 2021-08-02 DIAGNOSIS — N2581 Secondary hyperparathyroidism of renal origin: Secondary | ICD-10-CM | POA: Diagnosis not present

## 2021-08-02 DIAGNOSIS — Z992 Dependence on renal dialysis: Secondary | ICD-10-CM | POA: Diagnosis not present

## 2021-08-05 DIAGNOSIS — N2581 Secondary hyperparathyroidism of renal origin: Secondary | ICD-10-CM | POA: Diagnosis not present

## 2021-08-05 DIAGNOSIS — Z992 Dependence on renal dialysis: Secondary | ICD-10-CM | POA: Diagnosis not present

## 2021-08-05 DIAGNOSIS — N186 End stage renal disease: Secondary | ICD-10-CM | POA: Diagnosis not present

## 2021-08-06 DIAGNOSIS — Z992 Dependence on renal dialysis: Secondary | ICD-10-CM | POA: Diagnosis not present

## 2021-08-06 DIAGNOSIS — N186 End stage renal disease: Secondary | ICD-10-CM | POA: Diagnosis not present

## 2021-08-06 DIAGNOSIS — N2581 Secondary hyperparathyroidism of renal origin: Secondary | ICD-10-CM | POA: Diagnosis not present

## 2021-08-07 ENCOUNTER — Encounter: Payer: Self-pay | Admitting: Physical Therapy

## 2021-08-07 ENCOUNTER — Ambulatory Visit (INDEPENDENT_AMBULATORY_CARE_PROVIDER_SITE_OTHER): Payer: BC Managed Care – PPO | Admitting: Physical Therapy

## 2021-08-07 ENCOUNTER — Other Ambulatory Visit: Payer: Self-pay

## 2021-08-07 DIAGNOSIS — M25672 Stiffness of left ankle, not elsewhere classified: Secondary | ICD-10-CM | POA: Diagnosis not present

## 2021-08-07 DIAGNOSIS — M79661 Pain in right lower leg: Secondary | ICD-10-CM

## 2021-08-07 DIAGNOSIS — R2689 Other abnormalities of gait and mobility: Secondary | ICD-10-CM

## 2021-08-07 DIAGNOSIS — M6281 Muscle weakness (generalized): Secondary | ICD-10-CM

## 2021-08-07 DIAGNOSIS — R2681 Unsteadiness on feet: Secondary | ICD-10-CM | POA: Diagnosis not present

## 2021-08-07 DIAGNOSIS — M21372 Foot drop, left foot: Secondary | ICD-10-CM

## 2021-08-07 NOTE — Patient Instructions (Signed)

## 2021-08-07 NOTE — Therapy (Signed)
[facility] Physical Therapy [address] [place], [place], [address] Phone: [phone]   Fax:  [phone]  Physical Therapy Treatment  Patient Details  Name: Duane Hall MRN: [id number] Date of Birth: 08-03-1956 Referring Provider (PT): [name], MD   Encounter Date: 08/07/2021   PT End of Session - 08/07/21 1524     Visit Number 3    Number of Visits 20    Date for PT Re-Evaluation 10/04/21    Authorization Type BCBS    Authorization Time Period OOP & Ded MET    PT Start Time 1520    PT Stop Time 1600    PT Time Calculation (min) 40 min    Equipment Utilized During Treatment Gait belt    Activity Tolerance Patient tolerated treatment well;Patient limited by pain    Behavior During Therapy [facility] for tasks assessed/performed             Past Medical History:  Diagnosis Date   Asthma    Cancer ([place])    Cataract    CKD (chronic kidney disease), stage III ([place])    Diabetes mellitus    Glaucoma    Vascular insufficiency 05/2020    Past Surgical History:  Procedure Laterality Date   ABDOMINAL AORTOGRAM W/LOWER EXTREMITY N/A 06/19/2020   Procedure: ABDOMINAL AORTOGRAM W/LOWER EXTREMITY;  Surgeon: Waynetta Sandy, MD;  Location: [place] [facility];  Service: Cardiovascular;  Laterality: N/A;   ABDOMINAL AORTOGRAM W/LOWER EXTREMITY Left 10/16/2020   Procedure: ABDOMINAL AORTOGRAM W/LOWER EXTREMITY;  Surgeon: Waynetta Sandy, MD;  Location: [place] [facility];  Service: Cardiovascular;  Laterality: Left;   ABDOMINAL AORTOGRAM W/LOWER EXTREMITY N/A 12/25/2020   Procedure: ABDOMINAL AORTOGRAM W/LOWER EXTREMITY;  Surgeon: Waynetta Sandy, MD;  Location: [place] [facility];  Service: Cardiovascular;  Laterality: N/A;   AMPUTATION Right 01/12/2021   Procedure: 1ST AMPUTATION RAY;  Surgeon: [name], DPM;  Location: [place];  Service: Podiatry;  Laterality: Right;   AMPUTATION Right 03/13/2021   Procedure: RIGHT BELOW  KNEE AMPUTATION;  Surgeon: Waynetta Sandy, MD;  Location: [place];  Service: Vascular;  Laterality: Right;   AV FISTULA PLACEMENT Right 09/14/2019   Procedure: Right Arm Basilic Vein transposition;  Surgeon: Angelia Mould, MD;  Location: [place];  Service: Vascular;  Laterality: Right;   BONE BIOPSY Left 07/29/2020   Procedure: BONE BIOPSY;  Surgeon: [name], DPM;  Location: [place];  Service: Podiatry;  Laterality: Left;  Need bone trephines and/or large bore Giamshidi   COLONOSCOPY     PERIPHERAL VASCULAR ATHERECTOMY Left 06/19/2020   Procedure: PERIPHERAL VASCULAR ATHERECTOMY;  Surgeon: Waynetta Sandy, MD;  Location: [place] [facility];  Service: Cardiovascular;  Laterality: Left;  SFA   PERIPHERAL VASCULAR ATHERECTOMY  12/25/2020   Procedure: PERIPHERAL VASCULAR ATHERECTOMY;  Surgeon: Waynetta Sandy, MD;  Location: [place] [facility];  Service: Cardiovascular;;  Lt. PT - Laser Lt. SFA - Laser   PERIPHERAL VASCULAR BALLOON ANGIOPLASTY  12/25/2020   Procedure: PERIPHERAL VASCULAR BALLOON ANGIOPLASTY;  Surgeon: Waynetta Sandy, MD;  Location: [place] [facility];  Service: Cardiovascular;;  Lt. SFA and PT   UPPER GASTROINTESTINAL ENDOSCOPY  02/2019   Dr Benson Norway      There were no vitals filed for this visit.   Subjective Assessment - 08/07/21 1520     Subjective He has been wearing prosthesis 3 hrs 2x/day. He struggles with donning prosthesis.  He has been walking in home  some.    Patient is accompained by: Family member    Pertinent History ESRD home dialysis, DM, PVD, asthma, glaucoma    Patient Stated Goals Walk with prosthesis at community level.  Get around house without w/c.    Currently in Pain? No/denies                               [facility] Adult PT Treatment/Exercise - 08/07/21 1520       Transfers   Transfers Sit to Stand;Stand to Sit    Sit to Stand 5: Supervision;With upper extremity assist;With  armrests;From chair/3-in-1;Other (comment)   needs UE support to stabilize   Sit to Stand Details Visual cues/gestures for sequencing;Verbal cues for technique    Stand to Sit 5: Supervision;With upper extremity assist;With armrests;To chair/3-in-1;Other (comment)   needs UE support to stabilize   Stand to Sit Details (indicate cue type and reason) Verbal cues for technique;Visual cues/gestures for sequencing      Ambulation/Gait   Ambulation/Gait --    Ambulation/Gait Assistance --    Ambulation Distance (Feet) --    Assistive device --    Stairs --    Stairs Assistance --    Stair Management Technique --    Number of Stairs --    Height of Stairs --    Ramp --    Curb --      Neuro Re-ed    Neuro Re-ed Details  HEP at sink see pt instructions.  Cues for proprioception using residual limb to sense RLE, progressively decreasing UE support for balance.  pt & wife verbalized understanding while pt performed exercises.      Prosthetics   Prosthetic Care Comments  PT reviewed donning including pin alignment in relationship to distal 2-3" without bone structure.    Current prosthetic wear tolerance (days/week)  daily    Current prosthetic wear tolerance (#hours/day)  3hrs 2x/day increase to 4hrs 2x/day.    Current prosthetic weight-bearing tolerance (hours/day)  patient tolerated standing for 5 min with partial weight on prosthesis with residual limb pain ~4/10    Edema pitting    Residual limb condition  no open areas, no hair growth, normal color & temperature, cylinderical shape, distal 2-3" of limb without tibia or bone structure.    Education Provided Proper Donning;Proper wear schedule/adjustment;Other (comment)   see prosthetic care comments   Person(s) Educated Patient;Spouse    Education Method Explanation;Demonstration;Tactile cues;Verbal cues    Education Method Verbalized understanding;Tactile cues required;Verbal cues required;Needs further instruction    Donning Prosthesis  Supervision             Do each exercise 1-2  times per day Do each exercise 5-10 repetitions Hold each exercise for 2 seconds to feel your location  AT [place].  Try to find this position when standing still for activities.   USE TAPE ON FLOOR TO MARK THE MIDLINE POSITION which is even with middle of sink.  You also should try to feel with your limb pressure in socket.  You are trying to feel with limb what you used to feel with the bottom of your foot.  Side to Side Shift: Moving your hips only (not shoulders): move weight onto your left leg, HOLD/FEEL pressure in socket.  Move back to equal weight on each leg, HOLD/FEEL pressure in socket. Move weight onto your right leg, HOLD/FEEL  pressure in socket. Move back to equal weight on each leg, HOLD/FEEL pressure in socket. Repeat.  Start with both hands on sink, progress to hand on prosthetic side only, then no hands.  Front to Back Shift: Moving your hips only (not shoulders): move your weight forward onto your toes, HOLD/FEEL pressure in socket. Move your weight back to equal Flat Foot on both legs, HOLD/FEEL  pressure in socket. Move your weight back onto your heels, HOLD/FEEL  pressure in socket. Move your weight back to equal on both legs, HOLD/FEEL  pressure in socket. Repeat.  Start with both hands on sink, progress to hand on prosthetic side only, then no hands.  Moving Cones / Cups: With equal weight on each leg: Hold on with one hand the first time, then progress to no hand supports. Move cups from one side of sink to the other. Place cups ~2" out of your reach, progress to 10" beyond reach.  Place one hand in middle of sink and reach with other hand. Do both arms.  Then hover one hand and move cups with other hand.  Overhead/Upward Reaching: alternated reaching up to top cabinets or ceiling if no cabinets present. Keep equal weight on each leg. Start with one hand  support on counter while other hand reaches and progress to no hand support with reaching.  ace one hand in middle of sink and reach with other hand. Do both arms.  Then hover one hand and move cups with other hand.  5.   Looking Over Shoulders: With equal weight on each leg: alternate turning to look over your shoulders with one hand support on counter as needed.  Start with head motions only to look in front of shoulder, then even with shoulder and progress to looking behind you. To look to side, move head /eyes, then shoulder on side looking pulls back, shift more weight to side looking and pull hip back. Place one hand in middle of sink and let go with other hand so your shoulder can pull back. Switch hands to look other way.   Then hover one hand and look over shoulder. If looking right, use left hand at sink. If looking left, use right hand at sink. 6.  Stepping with leg that is not amputated:  Move items under cabinet out of your way. Shift your hips/pelvis so weight on prosthesis. Tighten muscles in hip on prosthetic side.  SLOWLY step other leg so front of foot is in cabinet. Then step back to floor.          PT Short Term Goals - 07/31/21 1632       PT SHORT TERM GOAL #1   Title Patient donnes prosthesis modified independent & verbalizes proper cleaning.    Time 4    Period Weeks    Status On-going    Target Date 08/24/21      PT SHORT TERM GOAL #2   Title Patient tolerates prosthesis >8 hrs total /day without skin issues or limb pain <2/10 after standing.    Time 4    Period Weeks    Status On-going    Target Date 08/24/21      PT SHORT TERM GOAL #3   Title Patient able to pick up items from floor & reach 10" with RW support safely.    Time 4    Period Weeks    Status On-going    Target Date 08/24/21      PT SHORT TERM GOAL #4  Title Patient ambulates 150' with RW & prosthesis with supervision    Time 4    Period Weeks    Status On-going    Target Date 08/24/21       PT SHORT TERM GOAL #5   Title Patient negotiates ramps & curbs with RW & prosthesis with minA.    Time 4    Period Weeks    Status On-going    Target Date 08/24/21               PT Long Term Goals - 07/31/21 1632       PT LONG TERM GOAL #1   Title Patient demonstrates & verbalized understanding of prosthetic care to enable safe utilization of prosthesis.    Time 10    Period Weeks    Status On-going    Target Date 10/04/21      PT LONG TERM GOAL #2   Title Patient tolerates prosthesis wear >90% of awake hours without skin or limb pain issues.    Time 10    Period Weeks    Status On-going    Target Date 10/04/21      PT LONG TERM GOAL #3   Title Berg Balance >/= 36/56 to indicate lower fall risk    Time 10    Period Weeks    Status On-going    Target Date 10/04/21      PT LONG TERM GOAL #4   Title Patient ambulates 300' with LRAD & prosthesis modified independent    Time 10    Period Weeks    Status On-going    Target Date 10/04/21      PT LONG TERM GOAL #5   Title Patient negotiates ramps, curbs & stairs with LRAD & prosthesis modified independent.    Time 10    Period Weeks    Status On-going    Target Date 10/04/21                   Plan - 08/07/21 1520     Clinical Impression Statement Patient appears to have a better understanding of donning prosthesis.  PT educated in HEP that factilitates proprioception with residual limb / socket interface, balance and standing tolerance. He appears to understand the program.    Personal Factors and Comorbidities Comorbidity 3+;Time since onset of injury/illness/exacerbation    Comorbidities ESRD home dialysis, DM, PVD, asthma, glaucoma    Examination-Activity Limitations Lift;Locomotion Level;Squat;Stairs;Stand;Transfers    Examination-Participation Restrictions Community Activity    Stability/Clinical Decision Making Evolving/Moderate complexity    Rehab Potential Good    PT Frequency 2x / week     PT Duration Other (comment)   10 weeks   PT Treatment/Interventions ADLs/Self Care Home Management;DME Instruction;Gait training;Stair training;Functional mobility training;Therapeutic activities;Therapeutic exercise;Balance training;Neuromuscular re-education;Patient/family education;Prosthetic Training;Manual techniques;Passive range of motion;Vestibular    PT Next Visit Plan review prosthetic care & progress wear, instruct in use of rollator walker including ramps & curbs.    Consulted and Agree with Plan of Care Patient;Family member/caregiver    Family Member Consulted wife             Patient will benefit from skilled therapeutic intervention in order to improve the following deficits and impairments:  Abnormal gait, Decreased activity tolerance, Decreased balance, Decreased endurance, Decreased knowledge of use of DME, Decreased mobility, Decreased range of motion, Decreased strength, Increased edema, Impaired flexibility, Postural dysfunction, Prosthetic Dependency, Pain  Visit Diagnosis: Unsteadiness on feet  Other abnormalities of gait and  mobility  Stiffness of left ankle, not elsewhere classified  Pain in right lower leg  Muscle weakness (generalized)  Foot drop, left     Problem List Patient Active Problem List   Diagnosis Date Noted   Degeneration of lumbar intervertebral disc 05/18/2021   Drug induced constipation    Thrombocytopenia (HCC)    Labile blood pressure    Post-operative pain    S/P BKA (below knee amputation) unilateral, right (HCC)    Right BKA infection ([place])    Gangrene of right foot ([place]) 03/16/2021   Acquired absence of right leg below knee ([place]) 03/14/2021   Peripheral arterial disease (HCC)    Gangrene ([place])    Osteomyelitis of right foot ([place])    Sepsis ([place]) 01/11/2021   Cellulitis of right foot 01/11/2021   Lumbar spondylosis 12/26/2020   Lumbar radiculopathy 10/05/2020   Left atrial dilation 08/12/2020   Lumbar back pain  08/12/2020   Acute osteomyelitis of left calcaneus ([place])    Bilateral pseudophakia 07/28/2020   Stable proliferative diabetic retinopathy of both eyes associated with type 2 diabetes mellitus ([place]) 07/28/2020   Diabetic foot ulcer ([place]) 07/27/2020   ESRD on hemodialysis ([place]) 07/27/2020   Gastroparesis diabeticorum ([place]) 07/02/2020   Vascular insufficiency of extremity 06/18/2020   Awaiting transplantation of kidney 06/18/2020   Foot ulcer, left ([place]) 06/18/2020   Dependence on renal dialysis ([place]) 05/01/2020   Cramp and spasm 05/01/2020   Hemodialysis-associated hypotension 05/01/2020   Aortic atherosclerosis ([place]) 02/28/2020   Hypoglycemia, unspecified 12/20/2019   Iron deficiency anemia, unspecified 11/17/2019   Allergy, unspecified, initial encounter 10/18/2019   Type 2 diabetes mellitus with diabetic peripheral angiopathy without gangrene ([place]) 10/16/2019   Other disorders of phosphorus metabolism 10/14/2019   Secondary hyperparathyroidism of renal origin ([place]) 32/11/2481   Complication of vascular dialysis catheter 10/09/2019   Hypercalcemia 10/07/2019   Hyperlipidemia, unspecified 10/07/2019   Tobacco use 10/07/2019   Unspecified glaucoma 10/07/2019   Encounter for immunization 10/06/2019   Coagulation defect, unspecified ([place]) 10/05/2019   ESRD (end stage renal disease) ([place]) 10/05/2019   Hypokalemia 09/09/2019   Other emphysema ([place]) 11/23/2018   Benign prostatic hyperplasia with weak urinary stream 11/18/2018   Pure hypercholesterolemia 11/18/2018   Uncontrolled type 2 diabetes mellitus with hyperglycemia ([place]) 11/18/2018   Diabetic retinopathy ([place]) 08/25/2015   Claudication ([place]) 04/07/2015   Anemia of chronic disease 09/12/2014   Hepatitis C 10/15/2013   Pain in joint, shoulder region 02/10/2013   Diabetes mellitus type 2 in nonobese [facility]) 02/24/2012   Hypertension 02/24/2012   Asthma 02/24/2012    Jamey Reas, PT, DPT 08/07/2021, 5:10 PM  [facility] Physical Therapy [address] [place], [place], [address] Phone: [phone]   Fax:  [phone]  Name: Duane Hall MRN: [id number] Date of Birth: [birth year]

## 2021-08-08 DIAGNOSIS — N186 End stage renal disease: Secondary | ICD-10-CM | POA: Diagnosis not present

## 2021-08-08 DIAGNOSIS — N2581 Secondary hyperparathyroidism of renal origin: Secondary | ICD-10-CM | POA: Diagnosis not present

## 2021-08-08 DIAGNOSIS — Z992 Dependence on renal dialysis: Secondary | ICD-10-CM | POA: Diagnosis not present

## 2021-08-09 ENCOUNTER — Other Ambulatory Visit: Payer: Self-pay | Admitting: Physician Assistant

## 2021-08-09 DIAGNOSIS — Z992 Dependence on renal dialysis: Secondary | ICD-10-CM | POA: Diagnosis not present

## 2021-08-09 DIAGNOSIS — R911 Solitary pulmonary nodule: Secondary | ICD-10-CM

## 2021-08-09 DIAGNOSIS — N2581 Secondary hyperparathyroidism of renal origin: Secondary | ICD-10-CM | POA: Diagnosis not present

## 2021-08-09 DIAGNOSIS — N186 End stage renal disease: Secondary | ICD-10-CM | POA: Diagnosis not present

## 2021-08-12 DIAGNOSIS — Z992 Dependence on renal dialysis: Secondary | ICD-10-CM | POA: Diagnosis not present

## 2021-08-12 DIAGNOSIS — N2581 Secondary hyperparathyroidism of renal origin: Secondary | ICD-10-CM | POA: Diagnosis not present

## 2021-08-12 DIAGNOSIS — N186 End stage renal disease: Secondary | ICD-10-CM | POA: Diagnosis not present

## 2021-08-13 DIAGNOSIS — N2581 Secondary hyperparathyroidism of renal origin: Secondary | ICD-10-CM | POA: Diagnosis not present

## 2021-08-13 DIAGNOSIS — N186 End stage renal disease: Secondary | ICD-10-CM | POA: Diagnosis not present

## 2021-08-13 DIAGNOSIS — Z992 Dependence on renal dialysis: Secondary | ICD-10-CM | POA: Diagnosis not present

## 2021-08-14 ENCOUNTER — Encounter: Payer: BC Managed Care – PPO | Admitting: Physical Therapy

## 2021-08-15 DIAGNOSIS — N2581 Secondary hyperparathyroidism of renal origin: Secondary | ICD-10-CM | POA: Diagnosis not present

## 2021-08-15 DIAGNOSIS — Z992 Dependence on renal dialysis: Secondary | ICD-10-CM | POA: Diagnosis not present

## 2021-08-15 DIAGNOSIS — N186 End stage renal disease: Secondary | ICD-10-CM | POA: Diagnosis not present

## 2021-08-16 DIAGNOSIS — N2581 Secondary hyperparathyroidism of renal origin: Secondary | ICD-10-CM | POA: Diagnosis not present

## 2021-08-16 DIAGNOSIS — N186 End stage renal disease: Secondary | ICD-10-CM | POA: Diagnosis not present

## 2021-08-16 DIAGNOSIS — Z992 Dependence on renal dialysis: Secondary | ICD-10-CM | POA: Diagnosis not present

## 2021-08-19 DIAGNOSIS — N2581 Secondary hyperparathyroidism of renal origin: Secondary | ICD-10-CM | POA: Diagnosis not present

## 2021-08-19 DIAGNOSIS — Z992 Dependence on renal dialysis: Secondary | ICD-10-CM | POA: Diagnosis not present

## 2021-08-19 DIAGNOSIS — N186 End stage renal disease: Secondary | ICD-10-CM | POA: Diagnosis not present

## 2021-08-20 DIAGNOSIS — N2581 Secondary hyperparathyroidism of renal origin: Secondary | ICD-10-CM | POA: Diagnosis not present

## 2021-08-20 DIAGNOSIS — Z992 Dependence on renal dialysis: Secondary | ICD-10-CM | POA: Diagnosis not present

## 2021-08-20 DIAGNOSIS — N186 End stage renal disease: Secondary | ICD-10-CM | POA: Diagnosis not present

## 2021-08-21 ENCOUNTER — Encounter: Payer: Self-pay | Admitting: Physical Therapy

## 2021-08-21 ENCOUNTER — Other Ambulatory Visit: Payer: Self-pay

## 2021-08-21 ENCOUNTER — Ambulatory Visit (INDEPENDENT_AMBULATORY_CARE_PROVIDER_SITE_OTHER): Payer: BC Managed Care – PPO | Admitting: Physical Therapy

## 2021-08-21 DIAGNOSIS — R2689 Other abnormalities of gait and mobility: Secondary | ICD-10-CM | POA: Diagnosis not present

## 2021-08-21 DIAGNOSIS — M6281 Muscle weakness (generalized): Secondary | ICD-10-CM

## 2021-08-21 DIAGNOSIS — M25672 Stiffness of left ankle, not elsewhere classified: Secondary | ICD-10-CM | POA: Diagnosis not present

## 2021-08-21 DIAGNOSIS — M21372 Foot drop, left foot: Secondary | ICD-10-CM

## 2021-08-21 DIAGNOSIS — M79661 Pain in right lower leg: Secondary | ICD-10-CM

## 2021-08-21 DIAGNOSIS — R2681 Unsteadiness on feet: Secondary | ICD-10-CM

## 2021-08-21 NOTE — Patient Instructions (Addendum)
Increasing your activity level is important.  Short distances which is walking from one room to another. Work to increase frequency back to prior level.  Medium distances are entering & exiting your home or community with limited distances. Start with 4 medium walks which is one outing to one location and increase number of tolerated amounts per day.  Long distance is your highest tolerance for you. Walk until you feel you must rest. Back or leg pain or general fatigue are indicators to maximum tolerance. Monitor by distance or time. Try to walk your BEST distance 1-2 times per day. You should see this increase over time.   Dialysis days compared to non-dialysis days will have lower frequency of short walks and shorter distance of long walk or your max tolerable distance.    Hanger Socks: 1-ply is yellow color at top, 3-ply is green at top, 5-ply is navy blue at top How many ply you need depends on your limb size.  You should have even pressure on your limb when standing & walking.  Guidance points: 1. How ease it goes on? Should be some resistance. Too few it goes on too easily. Too many it takes a lot of work to get it on. 2. How many clicks you get. Especially clicks in sitting. 3. After standing or walking, check knee cap. Bottom should be just under the front lip.  Too few bottom of knee cap sits on indention. Too many bottom is above front lip. 4. Have your feet beside each other & hips over feet. Place hands on your waist. Pelvis Should be level. Too few prosthetic side will be low. Too many prosthetic side will be high.    Get ply socks correct before you leave the house. Take extra socks with you. Take one 3-ply and two 1-ply with you. This is in addition to what you are wearing.

## 2021-08-21 NOTE — Therapy (Signed)
[facility] [address] [place], [place], [address] Phone: [phone]   Fax:  [phone]  Physical Therapy Treatment  Patient Details  Name: Duane Hall MRN: [id number] Date of Birth: 07-01-1956 Referring Provider (PT): [name], MD   Encounter Date: 08/21/2021   PT End of Session - 08/21/21 1514     Visit Number 4    Number of Visits 20    Date for PT Re-Evaluation 10/04/21    Authorization Type BCBS    Authorization Time Period OOP & Ded MET    PT Start Time 1515    PT Stop Time 1600    PT Time Calculation (min) 45 min    Equipment Utilized During Treatment Gait belt    Activity Tolerance Patient tolerated treatment well;Patient limited by pain    Behavior During Therapy [facility] for tasks assessed/performed             Past Medical History:  Diagnosis Date   Asthma    Cancer ([place])    Cataract    CKD (chronic kidney disease), stage III ([place])    Diabetes mellitus    Glaucoma    Vascular insufficiency 05/2020    Past Surgical History:  Procedure Laterality Date   ABDOMINAL AORTOGRAM W/LOWER EXTREMITY N/A 06/19/2020   Procedure: ABDOMINAL AORTOGRAM W/LOWER EXTREMITY;  Surgeon: Waynetta Sandy, MD;  Location: [place] [facility];  Service: Cardiovascular;  Laterality: N/A;   ABDOMINAL AORTOGRAM W/LOWER EXTREMITY Left 10/16/2020   Procedure: ABDOMINAL AORTOGRAM W/LOWER EXTREMITY;  Surgeon: Waynetta Sandy, MD;  Location: [place] [facility];  Service: Cardiovascular;  Laterality: Left;   ABDOMINAL AORTOGRAM W/LOWER EXTREMITY N/A 12/25/2020   Procedure: ABDOMINAL AORTOGRAM W/LOWER EXTREMITY;  Surgeon: Waynetta Sandy, MD;  Location: [place] [facility];  Service: Cardiovascular;  Laterality: N/A;   AMPUTATION Right 01/12/2021   Procedure: 1ST AMPUTATION RAY;  Surgeon: [name], DPM;  Location: [place];  Service: Podiatry;  Laterality: Right;   AMPUTATION Right 03/13/2021   Procedure: RIGHT  BELOW KNEE AMPUTATION;  Surgeon: Waynetta Sandy, MD;  Location: [place];  Service: Vascular;  Laterality: Right;   AV FISTULA PLACEMENT Right 09/14/2019   Procedure: Right Arm Basilic Vein transposition;  Surgeon: Angelia Mould, MD;  Location: [place];  Service: Vascular;  Laterality: Right;   BONE BIOPSY Left 07/29/2020   Procedure: BONE BIOPSY;  Surgeon: [name], DPM;  Location: [place];  Service: Podiatry;  Laterality: Left;  Need bone trephines and/or large bore Giamshidi   COLONOSCOPY     PERIPHERAL VASCULAR ATHERECTOMY Left 06/19/2020   Procedure: PERIPHERAL VASCULAR ATHERECTOMY;  Surgeon: Waynetta Sandy, MD;  Location: [place] [facility];  Service: Cardiovascular;  Laterality: Left;  SFA   PERIPHERAL VASCULAR ATHERECTOMY  12/25/2020   Procedure: PERIPHERAL VASCULAR ATHERECTOMY;  Surgeon: Waynetta Sandy, MD;  Location: [place] [facility];  Service: Cardiovascular;;  Lt. PT - Laser Lt. SFA - Laser   PERIPHERAL VASCULAR BALLOON ANGIOPLASTY  12/25/2020   Procedure: PERIPHERAL VASCULAR BALLOON ANGIOPLASTY;  Surgeon: Waynetta Sandy, MD;  Location: [place] [facility];  Service: Cardiovascular;;  Lt. SFA and PT   UPPER GASTROINTESTINAL ENDOSCOPY  02/2019   Dr Benson [place]      There were no vitals filed for this visit.   Subjective Assessment - 08/21/21 1515     Subjective He wore prosthesis 2-4 hrs 2x/day with some distal limb pain.  No falls.  Walk more.  Patient is accompained by: Family member    Pertinent History ESRD home dialysis, DM, PVD, asthma, glaucoma    Patient Stated Goals Walk with prosthesis at community level.  Get around house without w/c.    Currently in Pain? No/denies                               [facility] Adult PT Treatment/Exercise - 08/21/21 1515       Transfers   Transfers Sit to Stand;Stand to Sit    Sit to Stand 5: Supervision;With upper extremity assist;With armrests;From chair/3-in-1;Other  (comment)   needs UE support to stabilize   Stand to Sit 5: Supervision;With upper extremity assist;With armrests;To chair/3-in-1;Other (comment)   needs UE support to stabilize     Ambulation/Gait   Ambulation/Gait Yes    Ambulation/Gait Assistance 5: Supervision    Ambulation/Gait Assistance Details demo, verbal & visual (target on walker) on proper step width /not abducting.    Ambulation Distance (Feet) 80 Feet   80' X 2, 40' X 3   Assistive device Rollator;Rolling walker;Prosthesis    Ambulation Surface Level;Indoor      Prosthetics   Prosthetic Care Comments  PT instructed in adjusting ply socks with too few, too many & proper ply socks.  Increase wear to 4hrs on, 1hr off, 2-3 times per day, donning upon arising & doffing close to bed time.    Current prosthetic wear tolerance (days/week)  daily    Current prosthetic wear tolerance (#hours/day)  3-4hrs 2-3x/day    Current prosthetic weight-bearing tolerance (hours/day)  patient tolerated standing for 5 min with partial weight on prosthesis with residual limb pain ~2/10    Edema pitting    Residual limb condition  no open areas, no hair growth, normal color & temperature, cylinderical shape, distal 2-3" of limb without tibia or bone structure.    Education Provided Proper wear schedule/adjustment;Other (comment);Correct ply sock adjustment   see prosthetic care comments   Person(s) Educated Patient;Spouse    Education Method Explanation;Demonstration;Tactile cues;Verbal cues;Handout    Education Method Verbalized understanding;Tactile cues required;Verbal cues required;Needs further instruction                    PT Education - 08/21/21 1746     Education Details Increasing activity tolerance to build stamina & endurance - see pt instructions    Person(s) Educated Patient    Methods Explanation;Verbal cues;Handout    Comprehension Verbalized understanding;Verbal cues required;Need further instruction               PT Short Term Goals - 08/21/21 1747       PT SHORT TERM GOAL #1   Title Patient donnes prosthesis modified independent & verbalizes proper cleaning.    Time 4    Period Weeks    Status Achieved    Target Date 08/24/21      PT SHORT TERM GOAL #2   Title Patient tolerates prosthesis >8 hrs total /day without skin issues or limb pain <2/10 after standing.    Time 4    Period Weeks    Status Achieved    Target Date 08/24/21      PT SHORT TERM GOAL #3   Title Patient able to pick up items from floor & reach 10" with RW support safely.    Time 4    Period Weeks    Status On-going    Target Date 08/24/21  PT SHORT TERM GOAL #4   Title Patient ambulates 150' with RW & prosthesis with supervision    Time 4    Period Weeks    Status On-going    Target Date 08/24/21      PT SHORT TERM GOAL #5   Title Patient negotiates ramps & curbs with RW & prosthesis with minA.    Time 4    Period Weeks    Status Achieved    Target Date 08/24/21               PT Long Term Goals - 07/31/21 1632       PT LONG TERM GOAL #1   Title Patient demonstrates & verbalized understanding of prosthetic care to enable safe utilization of prosthesis.    Time 10    Period Weeks    Status On-going    Target Date 10/04/21      PT LONG TERM GOAL #2   Title Patient tolerates prosthesis wear >90% of awake hours without skin or limb pain issues.    Time 10    Period Weeks    Status On-going    Target Date 10/04/21      PT LONG TERM GOAL #3   Title Berg Balance >/= 36/56 to indicate lower fall risk    Time 10    Period Weeks    Status On-going    Target Date 10/04/21      PT LONG TERM GOAL #4   Title Patient ambulates 300' with LRAD & prosthesis modified independent    Time 10    Period Weeks    Status On-going    Target Date 10/04/21      PT LONG TERM GOAL #5   Title Patient negotiates ramps, curbs & stairs with LRAD & prosthesis modified independent.    Time 10    Period Weeks     Status On-going    Target Date 10/04/21                   Plan - 08/21/21 1515     Clinical Impression Statement Patient met 3 of STGs. Reduced frequency from 2 to 1x/wk has limited progression.  Pt & wife appear to have better understanding on how to adjust ply socks.  He had less lateral pelvic motion with proper ply socks & decreasing abduction.    Personal Factors and Comorbidities Comorbidity 3+;Time since onset of injury/illness/exacerbation    Comorbidities ESRD home dialysis, DM, PVD, asthma, glaucoma    Examination-Activity Limitations Lift;Locomotion Level;Squat;Stairs;Stand;Transfers    Examination-Participation Restrictions Community Activity    Stability/Clinical Decision Making Evolving/Moderate complexity    Rehab Potential Good    PT Frequency 2x / week    PT Duration Other (comment)   10 weeks   PT Treatment/Interventions ADLs/Self Care Home Management;DME Instruction;Gait training;Stair training;Functional mobility training;Therapeutic activities;Therapeutic exercise;Balance training;Neuromuscular re-education;Patient/family education;Prosthetic Training;Manual techniques;Passive range of motion;Vestibular    PT Next Visit Plan review prosthetic care & progress wear to all awake hours drying q4hrs, check remaining STGs    Consulted and Agree with Plan of Care Patient;Family member/caregiver    Family Member Consulted wife             Patient will benefit from skilled therapeutic intervention in order to improve the following deficits and impairments:  Abnormal gait, Decreased activity tolerance, Decreased balance, Decreased endurance, Decreased knowledge of use of DME, Decreased mobility, Decreased range of motion, Decreased strength, Increased edema, Impaired flexibility, Postural dysfunction, Prosthetic  Dependency, Pain  Visit Diagnosis: Unsteadiness on feet  Stiffness of left ankle, not elsewhere classified  Pain in right lower leg  Other  abnormalities of gait and mobility  Muscle weakness (generalized)  Foot drop, left     Problem List Patient Active Problem List   Diagnosis Date Noted   Degeneration of lumbar intervertebral disc 05/18/2021   Drug induced constipation    Thrombocytopenia (HCC)    Labile blood pressure    Post-operative pain    S/P BKA (below knee amputation) unilateral, right (HCC)    Right BKA infection (HCC)    Gangrene of right foot ([place]) 03/16/2021   Acquired absence of right leg below knee ([place]) 03/14/2021   Peripheral arterial disease ([place])    Gangrene ([place])    Osteomyelitis of right foot ([place])    Sepsis ([place]) 01/11/2021   Cellulitis of right foot 01/11/2021   Lumbar spondylosis 12/26/2020   Lumbar radiculopathy 10/05/2020   Left atrial dilation 08/12/2020   Lumbar back pain 08/12/2020   Acute osteomyelitis of left calcaneus ([place])    Bilateral pseudophakia 07/28/2020   Stable proliferative diabetic retinopathy of both eyes associated with type 2 diabetes mellitus ([place]) 07/28/2020   Diabetic foot ulcer ([place]) 07/27/2020   ESRD on hemodialysis ([place]) 07/27/2020   Gastroparesis diabeticorum ([place]) 07/02/2020   Vascular insufficiency of extremity 06/18/2020   Awaiting transplantation of kidney 06/18/2020   Foot ulcer, left ([place]) 06/18/2020   Dependence on renal dialysis ([place]) 05/01/2020   Cramp and spasm 05/01/2020   Hemodialysis-associated hypotension 05/01/2020   Aortic atherosclerosis ([place]) 02/28/2020   Hypoglycemia, unspecified 12/20/2019   Iron deficiency anemia, unspecified 11/17/2019   Allergy, unspecified, initial encounter 10/18/2019   Type 2 diabetes mellitus with diabetic peripheral angiopathy without gangrene ([place]) 10/16/2019   Other disorders of phosphorus metabolism 10/14/2019   Secondary hyperparathyroidism of renal origin ([place]) 16/09/9603   Complication of vascular dialysis catheter 10/09/2019   Hypercalcemia 10/07/2019   Hyperlipidemia, unspecified 10/07/2019    Tobacco use 10/07/2019   Unspecified glaucoma 10/07/2019   Encounter for immunization 10/06/2019   Coagulation defect, unspecified ([place]) 10/05/2019   ESRD (end stage renal disease) ([place]) 10/05/2019   Hypokalemia 09/09/2019   Other emphysema ([place]) 11/23/2018   Benign prostatic hyperplasia with weak urinary stream 11/18/2018   Pure hypercholesterolemia 11/18/2018   Uncontrolled type 2 diabetes mellitus with hyperglycemia ([place]) 11/18/2018   Diabetic retinopathy ([place]) 08/25/2015   Claudication ([place]) 04/07/2015   Anemia of chronic disease 09/12/2014   Hepatitis C 10/15/2013   Pain in joint, shoulder region 02/10/2013   Diabetes mellitus type 2 in nonobese [facility]) 02/24/2012   Hypertension 02/24/2012   Asthma 02/24/2012    Jamey Reas, PT, DPT 08/21/2021, 5:50 PM  [facility] [address] [place], [place], [address] Phone: [phone]   Fax:  [phone]  Name: Duane Hall MRN: [id number] Date of Birth: Oct 03, 1956

## 2021-08-22 DIAGNOSIS — N186 End stage renal disease: Secondary | ICD-10-CM | POA: Diagnosis not present

## 2021-08-22 DIAGNOSIS — Z992 Dependence on renal dialysis: Secondary | ICD-10-CM | POA: Diagnosis not present

## 2021-08-22 DIAGNOSIS — N2581 Secondary hyperparathyroidism of renal origin: Secondary | ICD-10-CM | POA: Diagnosis not present

## 2021-08-23 DIAGNOSIS — N2581 Secondary hyperparathyroidism of renal origin: Secondary | ICD-10-CM | POA: Diagnosis not present

## 2021-08-23 DIAGNOSIS — N186 End stage renal disease: Secondary | ICD-10-CM | POA: Diagnosis not present

## 2021-08-23 DIAGNOSIS — Z992 Dependence on renal dialysis: Secondary | ICD-10-CM | POA: Diagnosis not present

## 2021-08-26 DIAGNOSIS — Z89511 Acquired absence of right leg below knee: Secondary | ICD-10-CM | POA: Diagnosis not present

## 2021-08-26 DIAGNOSIS — N186 End stage renal disease: Secondary | ICD-10-CM | POA: Diagnosis not present

## 2021-08-26 DIAGNOSIS — N2581 Secondary hyperparathyroidism of renal origin: Secondary | ICD-10-CM | POA: Diagnosis not present

## 2021-08-26 DIAGNOSIS — Z992 Dependence on renal dialysis: Secondary | ICD-10-CM | POA: Diagnosis not present

## 2021-08-27 DIAGNOSIS — N2581 Secondary hyperparathyroidism of renal origin: Secondary | ICD-10-CM | POA: Diagnosis not present

## 2021-08-27 DIAGNOSIS — N186 End stage renal disease: Secondary | ICD-10-CM | POA: Diagnosis not present

## 2021-08-27 DIAGNOSIS — Z992 Dependence on renal dialysis: Secondary | ICD-10-CM | POA: Diagnosis not present

## 2021-08-28 ENCOUNTER — Other Ambulatory Visit: Payer: Self-pay

## 2021-08-28 ENCOUNTER — Encounter: Payer: Self-pay | Admitting: Physical Therapy

## 2021-08-28 ENCOUNTER — Ambulatory Visit (INDEPENDENT_AMBULATORY_CARE_PROVIDER_SITE_OTHER): Payer: BC Managed Care – PPO | Admitting: Physical Therapy

## 2021-08-28 DIAGNOSIS — R2681 Unsteadiness on feet: Secondary | ICD-10-CM | POA: Diagnosis not present

## 2021-08-28 DIAGNOSIS — M25672 Stiffness of left ankle, not elsewhere classified: Secondary | ICD-10-CM

## 2021-08-28 DIAGNOSIS — M79661 Pain in right lower leg: Secondary | ICD-10-CM | POA: Diagnosis not present

## 2021-08-28 DIAGNOSIS — M6281 Muscle weakness (generalized): Secondary | ICD-10-CM

## 2021-08-28 DIAGNOSIS — R2689 Other abnormalities of gait and mobility: Secondary | ICD-10-CM

## 2021-08-28 DIAGNOSIS — M21372 Foot drop, left foot: Secondary | ICD-10-CM

## 2021-08-28 DIAGNOSIS — R293 Abnormal posture: Secondary | ICD-10-CM

## 2021-08-29 DIAGNOSIS — N186 End stage renal disease: Secondary | ICD-10-CM | POA: Diagnosis not present

## 2021-08-29 DIAGNOSIS — Z992 Dependence on renal dialysis: Secondary | ICD-10-CM | POA: Diagnosis not present

## 2021-08-29 DIAGNOSIS — N2581 Secondary hyperparathyroidism of renal origin: Secondary | ICD-10-CM | POA: Diagnosis not present

## 2021-08-29 DIAGNOSIS — E1129 Type 2 diabetes mellitus with other diabetic kidney complication: Secondary | ICD-10-CM | POA: Diagnosis not present

## 2021-08-29 NOTE — Therapy (Signed)
[facility] Physical Therapy [address] [place], [place], [address] Phone: [phone]   Fax:  [phone]  Physical Therapy Treatment  Patient Details  Name: Duane Hall MRN: [id number] Date of Birth: February 28, 1956 Referring Provider (PT): [name], MD   Encounter Date: 08/28/2021   PT End of Session - 08/28/21 1513     Visit Number 5    Number of Visits 20    Date for PT Re-Evaluation 10/04/21    Authorization Type BCBS    Authorization Time Period OOP & Ded MET    PT Start Time 1513    PT Stop Time 1559    PT Time Calculation (min) 46 min    Equipment Utilized During Treatment Gait belt    Activity Tolerance Patient tolerated treatment well;Patient limited by pain    Behavior During Therapy [facility] for tasks assessed/performed             Past Medical History:  Diagnosis Date   Asthma    Cancer ([place])    Cataract    CKD (chronic kidney disease), stage III ([place])    Diabetes mellitus    Glaucoma    Vascular insufficiency 05/2020    Past Surgical History:  Procedure Laterality Date   ABDOMINAL AORTOGRAM W/LOWER EXTREMITY N/A 06/19/2020   Procedure: ABDOMINAL AORTOGRAM W/LOWER EXTREMITY;  Surgeon: Waynetta Sandy, MD;  Location: [place] [facility];  Service: Cardiovascular;  Laterality: N/A;   ABDOMINAL AORTOGRAM W/LOWER EXTREMITY Left 10/16/2020   Procedure: ABDOMINAL AORTOGRAM W/LOWER EXTREMITY;  Surgeon: Waynetta Sandy, MD;  Location: [place] [facility];  Service: Cardiovascular;  Laterality: Left;   ABDOMINAL AORTOGRAM W/LOWER EXTREMITY N/A 12/25/2020   Procedure: ABDOMINAL AORTOGRAM W/LOWER EXTREMITY;  Surgeon: Waynetta Sandy, MD;  Location: [place] [facility];  Service: Cardiovascular;  Laterality: N/A;   AMPUTATION Right 01/12/2021   Procedure: 1ST AMPUTATION RAY;  Surgeon: [name], DPM;  Location: [place];  Service: Podiatry;  Laterality: Right;   AMPUTATION Right 03/13/2021   Procedure: RIGHT  BELOW KNEE AMPUTATION;  Surgeon: Waynetta Sandy, MD;  Location: [place];  Service: Vascular;  Laterality: Right;   AV FISTULA PLACEMENT Right 09/14/2019   Procedure: Right Arm Basilic Vein transposition;  Surgeon: Angelia Mould, MD;  Location: [place];  Service: Vascular;  Laterality: Right;   BONE BIOPSY Left 07/29/2020   Procedure: BONE BIOPSY;  Surgeon: [name], DPM;  Location: [place];  Service: Podiatry;  Laterality: Left;  Need bone trephines and/or large bore Giamshidi   COLONOSCOPY     PERIPHERAL VASCULAR ATHERECTOMY Left 06/19/2020   Procedure: PERIPHERAL VASCULAR ATHERECTOMY;  Surgeon: Waynetta Sandy, MD;  Location: [place] [facility];  Service: Cardiovascular;  Laterality: Left;  SFA   PERIPHERAL VASCULAR ATHERECTOMY  12/25/2020   Procedure: PERIPHERAL VASCULAR ATHERECTOMY;  Surgeon: Waynetta Sandy, MD;  Location: [place] [facility];  Service: Cardiovascular;;  Lt. PT - Laser Lt. SFA - Laser   PERIPHERAL VASCULAR BALLOON ANGIOPLASTY  12/25/2020   Procedure: PERIPHERAL VASCULAR BALLOON ANGIOPLASTY;  Surgeon: Waynetta Sandy, MD;  Location: [place] [facility];  Service: Cardiovascular;;  Lt. SFA and PT   UPPER GASTROINTESTINAL ENDOSCOPY  02/2019   Dr Benson [place]      There were no vitals filed for this visit.   Subjective Assessment - 08/28/21 1513     Subjective He has been wearing prosthesis 4hrs 2x/day. He has appt with Staci Righter, [facility] next Tuesday.  Patient is accompained by: Family member    Pertinent History ESRD home dialysis, DM, PVD, asthma, glaucoma    Patient Stated Goals Walk with prosthesis at community level.  Get around house without w/c.    Currently in Pain? No/denies                               [facility] Adult PT Treatment/Exercise - 08/28/21 1513       Transfers   Transfers Sit to Stand;Stand to Sit;Floor to Transfer    Sit to Stand 5: Supervision;With upper extremity  assist;With armrests;From chair/3-in-1;Other (comment)   needs UE support to stabilize   Stand to Sit 5: Supervision;With upper extremity assist;With armrests;To chair/3-in-1;Other (comment)   needs UE support to stabilize   Floor to Transfer 5: Supervision    Floor to Transfer Details (indicate cue type and reason) PT demo & verbal cues on technique pushing on chair bottom or horizontal surface via half kneeling.  pt return demo understanding with supervision & verbal/tactile cues.      Ambulation/Gait   Ambulation/Gait Yes    Ambulation/Gait Assistance 5: Supervision    Ambulation Distance (Feet) 150 Feet   150' X 2   Assistive device Rollator;Rolling walker;Prosthesis      High Level Balance   High Level Balance Activities Side stepping;Backward walking    High Level Balance Comments //bars      Therapeutic Activites    Therapeutic Activities Lifting    Lifting PT demo & verbal cues on picking up items from floor. Pt able to return demo with locked rollator walker support.      Neuro Re-ed    Neuro Re-ed Details  Pt able to reach 10" with locked rollator walker support      Exercises   Exercises Knee/Hip      Knee/Hip Exercises: Aerobic   Nustep Seat 11 Level 5 with BLEs & BUEs 8 min      Knee/Hip Exercises: Standing   Terminal Knee Extension Strengthening;Right;3 sets;10 reps;Theraband    Theraband Level (Terminal Knee Extension) Level 3 (Green)    Terminal Knee Extension Limitations 1st set bilateral stance, 2nd set initial contact, 3rd set terminal stance    Forward Step Up Both;1 set;5 reps;Hand Hold: 2;Step Height: 6"    Forward Step Up Limitations demo & verbal cues on technique    Step Down Both;1 set;5 reps;Hand Hold: 2;Step Height: 6"    Step Down Limitations demo & verbal cues on technique    Rocker Board 1 minute   square board w/2 pivot points, BUE support goal minimize WB, ant/level/post & right/level/left   Rocker Board Limitations demo, visual (mirror), verbal &  tactile cues on technique & rationale      Knee/Hip Exercises: Seated   Sit to Sand 10 reps;without UE support   from 24" bar stool with goal to not touch //bars to stabilize but required intermittent touch,  minA / tactile cues for wt shift & stabilization     Prosthetics   Prosthetic Care Comments  PT recommended increasing to all awake hours donning upon arising, remove for 2 hrs midday, then 2nd wear close to bedtime.    Current prosthetic wear tolerance (days/week)  daily    Current prosthetic wear tolerance (#hours/day)  4hrs 2x/day    Current prosthetic weight-bearing tolerance (hours/day)  patient tolerated standing for 5-8 min with partial weight on prosthesis with residual limb pain ~2/10  Edema pitting    Residual limb condition  no open areas, no hair growth, normal color & temperature, cylinderical shape, distal 2-3" of limb without tibia or bone structure.    Education Provided Proper wear schedule/adjustment;Other (comment)   see prosthetic care comments   Person(s) Educated Patient    Education Method Explanation;Verbal cues    Education Method Verbalized understanding;Verbal cues required;Needs further instruction    Donning Prosthesis Modified independent (device/increased time)                 Balance Exercises - 08/28/21 1515       Balance Exercises: Standing   Standing Eyes Opened Narrow base of support (BOS);Solid surface;Wide (BOA);Foam/compliant surface;Head turns;5 reps   narrow stance (~2" between feet) solid & wide stance (~5" between feet) foam,  head turns 4 directions   Standing Eyes Opened Limitations visual (mirror), tactile & verbal cues for balance reactions    Standing Eyes Closed Wide (BOA);Head turns;Solid surface;5 reps   4 directions   Standing Eyes Closed Limitations visual (eyes open between directions to reestablish midline), verbal & tactile / minA cues                 PT Short Term Goals - 08/28/21 1743       PT SHORT TERM  GOAL #1   Title Patient donnes prosthesis modified independent & verbalizes proper cleaning.    Time 4    Period Weeks    Status Achieved    Target Date 08/24/21      PT SHORT TERM GOAL #2   Title Patient tolerates prosthesis >8 hrs total /day without skin issues or limb pain <2/10 after standing.    Time 4    Period Weeks    Status Achieved    Target Date 08/24/21      PT SHORT TERM GOAL #3   Title Patient able to pick up items from floor & reach 10" with RW support safely.    Time 4    Period Weeks    Status Achieved    Target Date 08/24/21      PT SHORT TERM GOAL #4   Title Patient ambulates 150' with RW & prosthesis with supervision    Time 4    Period Weeks    Status Achieved    Target Date 08/24/21      PT SHORT TERM GOAL #5   Title Patient negotiates ramps & curbs with RW & prosthesis with minA.    Time 4    Period Weeks    Status Achieved    Target Date 08/24/21               PT Long Term Goals - 07/31/21 1632       PT LONG TERM GOAL #1   Title Patient demonstrates & verbalized understanding of prosthetic care to enable safe utilization of prosthesis.    Time 10    Period Weeks    Status On-going    Target Date 10/04/21      PT LONG TERM GOAL #2   Title Patient tolerates prosthesis wear >90% of awake hours without skin or limb pain issues.    Time 10    Period Weeks    Status On-going    Target Date 10/04/21      PT LONG TERM GOAL #3   Title Berg Balance >/= 36/56 to indicate lower fall risk    Time 10    Period Weeks  Status On-going    Target Date 10/04/21      PT LONG TERM GOAL #4   Title Patient ambulates 300' with LRAD & prosthesis modified independent    Time 10    Period Weeks    Status On-going    Target Date 10/04/21      PT LONG TERM GOAL #5   Title Patient negotiates ramps, curbs & stairs with LRAD & prosthesis modified independent.    Time 10    Period Weeks    Status On-going    Target Date 10/04/21                    Plan - 08/28/21 1513     Clinical Impression Statement Patient met all STGs for first 30-day period.  PT instructed in floor transfers in case of a fall. He appears to have a basic understanding.  PT worked on standing balance without UE support which improved but needs further work to minimize fall risk & need for UE support.    Personal Factors and Comorbidities Comorbidity 3+;Time since onset of injury/illness/exacerbation    Comorbidities ESRD home dialysis, DM, PVD, asthma, glaucoma    Examination-Activity Limitations Lift;Locomotion Level;Squat;Stairs;Stand;Transfers    Examination-Participation Restrictions Community Activity    Stability/Clinical Decision Making Evolving/Moderate complexity    Rehab Potential Good    PT Frequency 2x / week    PT Duration Other (comment)   10 weeks   PT Treatment/Interventions ADLs/Self Care Home Management;DME Instruction;Gait training;Stair training;Functional mobility training;Therapeutic activities;Therapeutic exercise;Balance training;Neuromuscular re-education;Patient/family education;Prosthetic Training;Manual techniques;Passive range of motion;Vestibular    PT Next Visit Plan review prosthetic care, set updatedvSTGs, work on balance activities facilitating reactions.    Consulted and Agree with Plan of Care Patient;Family member/caregiver    Family Member Consulted wife             Patient will benefit from skilled therapeutic intervention in order to improve the following deficits and impairments:  Abnormal gait, Decreased activity tolerance, Decreased balance, Decreased endurance, Decreased knowledge of use of DME, Decreased mobility, Decreased range of motion, Decreased strength, Increased edema, Impaired flexibility, Postural dysfunction, Prosthetic Dependency, Pain  Visit Diagnosis: Stiffness of left ankle, not elsewhere classified  Unsteadiness on feet  Pain in right lower leg  Other abnormalities of gait and  mobility  Muscle weakness (generalized)  Foot drop, left  Abnormal posture     Problem List Patient Active Problem List   Diagnosis Date Noted   Degeneration of lumbar intervertebral disc 05/18/2021   Drug induced constipation    Thrombocytopenia (HCC)    Labile blood pressure    Post-operative pain    S/P BKA (below knee amputation) unilateral, right (HCC)    Right BKA infection (HCC)    Gangrene of right foot ([place]) 03/16/2021   Acquired absence of right leg below knee ([place]) 03/14/2021   Peripheral arterial disease (HCC)    Gangrene (HCC)    Osteomyelitis of right foot ([place])    Sepsis ([place]) 01/11/2021   Cellulitis of right foot 01/11/2021   Lumbar spondylosis 12/26/2020   Lumbar radiculopathy 10/05/2020   Left atrial dilation 08/12/2020   Lumbar back pain 08/12/2020   Acute osteomyelitis of left calcaneus ([place])    Bilateral pseudophakia 07/28/2020   Stable proliferative diabetic retinopathy of both eyes associated with type 2 diabetes mellitus ([place]) 07/28/2020   Diabetic foot ulcer ([place]) 07/27/2020   ESRD on hemodialysis ([place]) 07/27/2020   Gastroparesis diabeticorum ([place]) 07/02/2020   Vascular insufficiency of  extremity 06/18/2020   Awaiting transplantation of kidney 06/18/2020   Foot ulcer, left ([place]) 06/18/2020   Dependence on renal dialysis ([place]) 05/01/2020   Cramp and spasm 05/01/2020   Hemodialysis-associated hypotension 05/01/2020   Aortic atherosclerosis ([place]) 02/28/2020   Hypoglycemia, unspecified 12/20/2019   Iron deficiency anemia, unspecified 11/17/2019   Allergy, unspecified, initial encounter 10/18/2019   Type 2 diabetes mellitus with diabetic peripheral angiopathy without gangrene ([place]) 10/16/2019   Other disorders of phosphorus metabolism 10/14/2019   Secondary hyperparathyroidism of renal origin ([place]) 24/08/7352   Complication of vascular dialysis catheter 10/09/2019   Hypercalcemia 10/07/2019   Hyperlipidemia, unspecified 10/07/2019   Tobacco  use 10/07/2019   Unspecified glaucoma 10/07/2019   Encounter for immunization 10/06/2019   Coagulation defect, unspecified ([place]) 10/05/2019   ESRD (end stage renal disease) ([place]) 10/05/2019   Hypokalemia 09/09/2019   Other emphysema ([place]) 11/23/2018   Benign prostatic hyperplasia with weak urinary stream 11/18/2018   Pure hypercholesterolemia 11/18/2018   Uncontrolled type 2 diabetes mellitus with hyperglycemia ([place]) 11/18/2018   Diabetic retinopathy ([place]) 08/25/2015   Claudication ([place]) 04/07/2015   Anemia of chronic disease 09/12/2014   Hepatitis C 10/15/2013   Pain in joint, shoulder region 02/10/2013   Diabetes mellitus type 2 in nonobese [facility]) 02/24/2012   Hypertension 02/24/2012   Asthma 02/24/2012    Jamey Reas, PT, DPT 08/29/2021, 7:49 AM  [facility] Physical Therapy [address] [place], [place], [address] Phone: [phone]   Fax:  [phone]  Name: STEPHENS SHREVE MRN: [id number] Date of Birth: 02-Feb-1956

## 2021-08-30 DIAGNOSIS — Z992 Dependence on renal dialysis: Secondary | ICD-10-CM | POA: Diagnosis not present

## 2021-08-30 DIAGNOSIS — N2581 Secondary hyperparathyroidism of renal origin: Secondary | ICD-10-CM | POA: Diagnosis not present

## 2021-08-30 DIAGNOSIS — N186 End stage renal disease: Secondary | ICD-10-CM | POA: Diagnosis not present

## 2021-09-02 DIAGNOSIS — Z992 Dependence on renal dialysis: Secondary | ICD-10-CM | POA: Diagnosis not present

## 2021-09-02 DIAGNOSIS — N186 End stage renal disease: Secondary | ICD-10-CM | POA: Diagnosis not present

## 2021-09-02 DIAGNOSIS — N2581 Secondary hyperparathyroidism of renal origin: Secondary | ICD-10-CM | POA: Diagnosis not present

## 2021-09-03 DIAGNOSIS — Z992 Dependence on renal dialysis: Secondary | ICD-10-CM | POA: Diagnosis not present

## 2021-09-03 DIAGNOSIS — N2581 Secondary hyperparathyroidism of renal origin: Secondary | ICD-10-CM | POA: Diagnosis not present

## 2021-09-03 DIAGNOSIS — N186 End stage renal disease: Secondary | ICD-10-CM | POA: Diagnosis not present

## 2021-09-04 ENCOUNTER — Ambulatory Visit (INDEPENDENT_AMBULATORY_CARE_PROVIDER_SITE_OTHER): Payer: BC Managed Care – PPO | Admitting: Physical Therapy

## 2021-09-04 ENCOUNTER — Other Ambulatory Visit: Payer: Self-pay

## 2021-09-04 ENCOUNTER — Encounter: Payer: Self-pay | Admitting: Physical Therapy

## 2021-09-04 ENCOUNTER — Ambulatory Visit
Admission: RE | Admit: 2021-09-04 | Discharge: 2021-09-04 | Disposition: A | Discharge status: Home / Self Care | Payer: BC Managed Care – PPO | Source: Ambulatory Visit | Attending: Physician Assistant | Admitting: Physician Assistant

## 2021-09-04 DIAGNOSIS — R2689 Other abnormalities of gait and mobility: Secondary | ICD-10-CM | POA: Diagnosis not present

## 2021-09-04 DIAGNOSIS — R2681 Unsteadiness on feet: Secondary | ICD-10-CM

## 2021-09-04 DIAGNOSIS — M6281 Muscle weakness (generalized): Secondary | ICD-10-CM | POA: Diagnosis not present

## 2021-09-04 DIAGNOSIS — M21372 Foot drop, left foot: Secondary | ICD-10-CM

## 2021-09-04 DIAGNOSIS — R911 Solitary pulmonary nodule: Secondary | ICD-10-CM

## 2021-09-04 DIAGNOSIS — R293 Abnormal posture: Secondary | ICD-10-CM

## 2021-09-04 DIAGNOSIS — M79661 Pain in right lower leg: Secondary | ICD-10-CM | POA: Diagnosis not present

## 2021-09-04 NOTE — Therapy (Signed)
[facility] Physical Therapy [address] [place], [place], [address] Phone: [phone]   Fax:  [phone]  Physical Therapy Treatment  Patient Details  Name: Duane Hall MRN: [id number] Date of Birth: 06/02/1956 Referring Provider (PT): [name], MD   Encounter Date: 09/04/2021   PT End of Session - 09/04/21 1512     Visit Number 6    Number of Visits 20    Date for PT Re-Evaluation 10/04/21    Authorization Type BCBS    Authorization Time Period OOP & Ded MET    PT Start Time 1509    PT Stop Time 1550    PT Time Calculation (min) 41 min    Equipment Utilized During Treatment Gait belt    Activity Tolerance Patient tolerated treatment well;Patient limited by pain    Behavior During Therapy [facility] for tasks assessed/performed             Past Medical History:  Diagnosis Date   Asthma    Cancer ([place])    Cataract    CKD (chronic kidney disease), stage III ([place])    Diabetes mellitus    Glaucoma    Vascular insufficiency 05/2020    Past Surgical History:  Procedure Laterality Date   ABDOMINAL AORTOGRAM W/LOWER EXTREMITY N/A 06/19/2020   Procedure: ABDOMINAL AORTOGRAM W/LOWER EXTREMITY;  Surgeon: Waynetta Sandy, MD;  Location: [place] [facility];  Service: Cardiovascular;  Laterality: N/A;   ABDOMINAL AORTOGRAM W/LOWER EXTREMITY Left 10/16/2020   Procedure: ABDOMINAL AORTOGRAM W/LOWER EXTREMITY;  Surgeon: Waynetta Sandy, MD;  Location: [place] [facility];  Service: Cardiovascular;  Laterality: Left;   ABDOMINAL AORTOGRAM W/LOWER EXTREMITY N/A 12/25/2020   Procedure: ABDOMINAL AORTOGRAM W/LOWER EXTREMITY;  Surgeon: Waynetta Sandy, MD;  Location: [place] [facility];  Service: Cardiovascular;  Laterality: N/A;   AMPUTATION Right 01/12/2021   Procedure: 1ST AMPUTATION RAY;  Surgeon: [name], DPM;  Location: [place];  Service: Podiatry;  Laterality: Right;   AMPUTATION Right 03/13/2021   Procedure: RIGHT BELOW  KNEE AMPUTATION;  Surgeon: Waynetta Sandy, MD;  Location: [place];  Service: Vascular;  Laterality: Right;   AV FISTULA PLACEMENT Right 09/14/2019   Procedure: Right Arm Basilic Vein transposition;  Surgeon: Angelia Mould, MD;  Location: [place];  Service: Vascular;  Laterality: Right;   BONE BIOPSY Left 07/29/2020   Procedure: BONE BIOPSY;  Surgeon: [name], DPM;  Location: [place];  Service: Podiatry;  Laterality: Left;  Need bone trephines and/or large bore Giamshidi   COLONOSCOPY     PERIPHERAL VASCULAR ATHERECTOMY Left 06/19/2020   Procedure: PERIPHERAL VASCULAR ATHERECTOMY;  Surgeon: Waynetta Sandy, MD;  Location: [place] [facility];  Service: Cardiovascular;  Laterality: Left;  SFA   PERIPHERAL VASCULAR ATHERECTOMY  12/25/2020   Procedure: PERIPHERAL VASCULAR ATHERECTOMY;  Surgeon: Waynetta Sandy, MD;  Location: [place] [facility];  Service: Cardiovascular;;  Lt. PT - Laser Lt. SFA - Laser   PERIPHERAL VASCULAR BALLOON ANGIOPLASTY  12/25/2020   Procedure: PERIPHERAL VASCULAR BALLOON ANGIOPLASTY;  Surgeon: Waynetta Sandy, MD;  Location: [place] [facility];  Service: Cardiovascular;;  Lt. SFA and PT   UPPER GASTROINTESTINAL ENDOSCOPY  02/2019   Dr Benson Norway      There were no vitals filed for this visit.   Subjective Assessment - 09/04/21 1509     Subjective He has been wearing prosthesis daily up to 10 hours.  No falls.    Patient is accompained by:  Family member    Pertinent History ESRD home dialysis, DM, PVD, asthma, glaucoma    Patient Stated Goals Walk with prosthesis at community level.  Get around house without w/c.    Currently in Pain? No/denies                               [facility] Adult PT Treatment/Exercise - 09/04/21 1510       Transfers   Transfers Sit to Stand;Stand to Sit;Floor to Transfer    Sit to Stand 5: Supervision;With upper extremity assist;With armrests;From chair/3-in-1;Other (comment)    needs UE support to stabilize   Stand to Sit 5: Supervision;With upper extremity assist;With armrests;To chair/3-in-1;Other (comment)   needs UE support to stabilize   Floor to Transfer --      Ambulation/Gait   Ambulation/Gait Yes    Ambulation/Gait Assistance 4: Min assist   cane & HHA   Ambulation/Gait Assistance Details verbal & tactile cues on sequence, upright posture & balance reactions.    Ambulation Distance (Feet) 120 Feet   enter/exit w/ rollotor, in clinic w/ cane & HHA (50' X 2 & 120')   Assistive device Rollator;Rolling walker;Prosthesis;Straight cane;1 person hand held assist   cane stand alone tip & TTA prosthesis   Ambulation Surface Level;Indoor      High Level Balance   High Level Balance Activities Side stepping;Backward walking;Turns   turning 90*   High Level Balance Comments cane stand alone tip (intermittent UE touch on //bars), PT demo & verbal cues on technique      Therapeutic Activites    Therapeutic Activities --    Lifting --      Neuro Re-ed    Neuro Re-ed Details  --      Exercises   Exercises Knee/Hip      Knee/Hip Exercises: Aerobic   Nustep Seat 10 Level 6 with BLEs & BUEs 8 min      Knee/Hip Exercises: Machines for Strengthening   Total Gym Leg Press shuttle leg press 125# 15 reps 2 sets 1st set back 45* and 2nd set back flat      Knee/Hip Exercises: Standing   Terminal Knee Extension --    Theraband Level (Terminal Knee Extension) --    Terminal Knee Extension Limitations --    Forward Step Up --    Forward Step Up Limitations --    Step Down --    Step Down Limitations --    Rocker Board --    Rocker Board Limitations --      Knee/Hip Exercises: Seated   Sit to Sand 10 reps;without UE support   from 24" bar stool with goal to not touch //bars to stabilize but required intermittent touch,  minA / tactile cues for wt shift & stabilization     Prosthetics   Prosthetic Care Comments  PT recommended increasing to all awake hours donning  upon arising, remove for 2 hrs midday, then 2nd wear close to bedtime.    Current prosthetic wear tolerance (days/week)  daily    Current prosthetic wear tolerance (#hours/day)  5 hrs 2x/day    Current prosthetic weight-bearing tolerance (hours/day)  patient tolerated standing for 5-8 min with partial weight on prosthesis with residual limb pain ~2/10    Edema pitting    Residual limb condition  no open areas, no hair growth, normal color & temperature, cylinderical shape, distal 2-3" of limb without tibia or bone structure.  Education Provided Proper wear schedule/adjustment;Other (comment)   see prosthetic care comments   Person(s) Educated Patient;Spouse    Education Method Explanation;Verbal cues    Education Method Verbalized understanding;Verbal cues required;Needs further instruction    Donning Prosthesis Modified independent (device/increased time)                 Balance Exercises - 09/04/21 1510       Balance Exercises: Standing   Standing Eyes Opened Solid surface;Head turns;5 reps;Narrow base of support (BOS)   narrow stance (~2" between feet) solid,  head turns 4 directions   Standing Eyes Opened Limitations visual (mirror), tactile & verbal cues for balance reactions    Standing Eyes Closed Wide (BOA);Head turns;Solid surface;5 reps   4 directions   Standing Eyes Closed Limitations visual (eyes open between directions to reestablish midline), verbal & tactile / minA cues                 PT Short Term Goals - 09/04/21 1559       PT SHORT TERM GOAL #1   Title Patient verbalizes how to adjust ply socks with limb volume changes.    Time 4    Period Weeks    Status New    Target Date 09/20/21      PT SHORT TERM GOAL #2   Title Patient tolerates prosthesis >80% of awake hrs total /day without skin issues or limb pain <2/10 after standing.    Time 3    Period Weeks    Status Revised    Target Date 09/20/21      PT SHORT TERM GOAL #3   Title Patient  able to look over shoulders with weight shift & trunk rotation without UE support safely.    Time 3    Period Weeks    Status New    Target Date 09/20/21      PT SHORT TERM GOAL #4   Title Patient ambulates 200' with cane & prosthesis with supervision    Time 3    Period Weeks    Status Revised    Target Date 09/20/21      PT SHORT TERM GOAL #5   Title Patient negotiates ramps & curbs with cane & prosthesis with minA.    Time 3    Period Weeks    Status Revised    Target Date 09/20/21               PT Long Term Goals - 07/31/21 1632       PT LONG TERM GOAL #1   Title Patient demonstrates & verbalized understanding of prosthetic care to enable safe utilization of prosthesis.    Time 10    Period Weeks    Status On-going    Target Date 10/04/21      PT LONG TERM GOAL #2   Title Patient tolerates prosthesis wear >90% of awake hours without skin or limb pain issues.    Time 10    Period Weeks    Status On-going    Target Date 10/04/21      PT LONG TERM GOAL #3   Title Berg Balance >/= 36/56 to indicate lower fall risk    Time 10    Period Weeks    Status On-going    Target Date 10/04/21      PT LONG TERM GOAL #4   Title Patient ambulates 300' with LRAD & prosthesis modified independent    Time 10  Period Weeks    Status On-going    Target Date 10/04/21      PT LONG TERM GOAL #5   Title Patient negotiates ramps, curbs & stairs with LRAD & prosthesis modified independent.    Time 10    Period Weeks    Status On-going    Target Date 10/04/21                   Plan - 09/04/21 1512     Clinical Impression Statement PT introduced cane for prosthetic gait today. His balance improved with activities & instruction. He did not report any additional limb pain with less UE support.    Personal Factors and Comorbidities Comorbidity 3+;Time since onset of injury/illness/exacerbation    Comorbidities ESRD home dialysis, DM, PVD, asthma, glaucoma     Examination-Activity Limitations Lift;Locomotion Level;Squat;Stairs;Stand;Transfers    Examination-Participation Restrictions Community Activity    Stability/Clinical Decision Making Evolving/Moderate complexity    Rehab Potential Good    PT Frequency 2x / week    PT Duration Other (comment)   10 weeks   PT Treatment/Interventions ADLs/Self Care Home Management;DME Instruction;Gait training;Stair training;Functional mobility training;Therapeutic activities;Therapeutic exercise;Balance training;Neuromuscular re-education;Patient/family education;Prosthetic Training;Manual techniques;Passive range of motion;Vestibular    PT Next Visit Plan review prosthetic care, work towards updated STGs, work on balance activities facilitating reactions, prosthetic gait with cane including ramps & curbs,    Consulted and Agree with Plan of Care Patient;Family member/caregiver    Family Member Consulted wife             Patient will benefit from skilled therapeutic intervention in order to improve the following deficits and impairments:  Abnormal gait, Decreased activity tolerance, Decreased balance, Decreased endurance, Decreased knowledge of use of DME, Decreased mobility, Decreased range of motion, Decreased strength, Increased edema, Impaired flexibility, Postural dysfunction, Prosthetic Dependency, Pain  Visit Diagnosis: Unsteadiness on feet  Pain in right lower leg  Other abnormalities of gait and mobility  Muscle weakness (generalized)  Foot drop, left  Abnormal posture     Problem List Patient Active Problem List   Diagnosis Date Noted   Degeneration of lumbar intervertebral disc 05/18/2021   Drug induced constipation    Thrombocytopenia (HCC)    Labile blood pressure    Post-operative pain    S/P BKA (below knee amputation) unilateral, right (HCC)    Right BKA infection (HCC)    Gangrene of right foot ([place]) 03/16/2021   Acquired absence of right leg below knee ([place]) 03/14/2021    Peripheral arterial disease (HCC)    Gangrene ([place])    Osteomyelitis of right foot ([place])    Sepsis ([place]) 01/11/2021   Cellulitis of right foot 01/11/2021   Lumbar spondylosis 12/26/2020   Lumbar radiculopathy 10/05/2020   Left atrial dilation 08/12/2020   Lumbar back pain 08/12/2020   Acute osteomyelitis of left calcaneus ([place])    Bilateral pseudophakia 07/28/2020   Stable proliferative diabetic retinopathy of both eyes associated with type 2 diabetes mellitus ([place]) 07/28/2020   Diabetic foot ulcer ([place]) 07/27/2020   ESRD on hemodialysis ([place]) 07/27/2020   Gastroparesis diabeticorum ([place]) 07/02/2020   Vascular insufficiency of extremity 06/18/2020   Awaiting transplantation of kidney 06/18/2020   Foot ulcer, left ([place]) 06/18/2020   Dependence on renal dialysis ([place]) 05/01/2020   Cramp and spasm 05/01/2020   Hemodialysis-associated hypotension 05/01/2020   Aortic atherosclerosis ([place]) 02/28/2020   Hypoglycemia, unspecified 12/20/2019   Iron deficiency anemia, unspecified 11/17/2019   Allergy, unspecified, initial encounter 10/18/2019  Type 2 diabetes mellitus with diabetic peripheral angiopathy without gangrene ([place]) 10/16/2019   Other disorders of phosphorus metabolism 10/14/2019   Secondary hyperparathyroidism of renal origin ([place]) 43/83/8184   Complication of vascular dialysis catheter 10/09/2019   Hypercalcemia 10/07/2019   Hyperlipidemia, unspecified 10/07/2019   Tobacco use 10/07/2019   Unspecified glaucoma 10/07/2019   Encounter for immunization 10/06/2019   Coagulation defect, unspecified ([place]) 10/05/2019   ESRD (end stage renal disease) ([place]) 10/05/2019   Hypokalemia 09/09/2019   Other emphysema ([place]) 11/23/2018   Benign prostatic hyperplasia with weak urinary stream 11/18/2018   Pure hypercholesterolemia 11/18/2018   Uncontrolled type 2 diabetes mellitus with hyperglycemia ([place]) 11/18/2018   Diabetic retinopathy ([place]) 08/25/2015   Claudication ([place]) 04/07/2015    Anemia of chronic disease 09/12/2014   Hepatitis C 10/15/2013   Pain in joint, shoulder region 02/10/2013   Diabetes mellitus type 2 in nonobese [facility]) 02/24/2012   Hypertension 02/24/2012   Asthma 02/24/2012    Jamey Reas, PT, DPT 09/04/2021, 4:04 PM  [facility] Physical Therapy [address] [place], [place], [address] Phone: [phone]   Fax:  [phone]  Name: Duane Hall MRN: [id number] Date of Birth: 12-23-1956

## 2021-09-05 DIAGNOSIS — Z992 Dependence on renal dialysis: Secondary | ICD-10-CM | POA: Diagnosis not present

## 2021-09-05 DIAGNOSIS — N2581 Secondary hyperparathyroidism of renal origin: Secondary | ICD-10-CM | POA: Diagnosis not present

## 2021-09-05 DIAGNOSIS — N186 End stage renal disease: Secondary | ICD-10-CM | POA: Diagnosis not present

## 2021-09-06 DIAGNOSIS — N2581 Secondary hyperparathyroidism of renal origin: Secondary | ICD-10-CM | POA: Diagnosis not present

## 2021-09-06 DIAGNOSIS — N186 End stage renal disease: Secondary | ICD-10-CM | POA: Diagnosis not present

## 2021-09-06 DIAGNOSIS — Z992 Dependence on renal dialysis: Secondary | ICD-10-CM | POA: Diagnosis not present

## 2021-09-07 DIAGNOSIS — Z992 Dependence on renal dialysis: Secondary | ICD-10-CM | POA: Diagnosis not present

## 2021-09-07 DIAGNOSIS — J438 Other emphysema: Secondary | ICD-10-CM | POA: Diagnosis not present

## 2021-09-07 DIAGNOSIS — L299 Pruritus, unspecified: Secondary | ICD-10-CM | POA: Diagnosis not present

## 2021-09-07 DIAGNOSIS — R911 Solitary pulmonary nodule: Secondary | ICD-10-CM | POA: Diagnosis not present

## 2021-09-09 DIAGNOSIS — N186 End stage renal disease: Secondary | ICD-10-CM | POA: Diagnosis not present

## 2021-09-09 DIAGNOSIS — N2581 Secondary hyperparathyroidism of renal origin: Secondary | ICD-10-CM | POA: Diagnosis not present

## 2021-09-09 DIAGNOSIS — Z992 Dependence on renal dialysis: Secondary | ICD-10-CM | POA: Diagnosis not present

## 2021-09-10 DIAGNOSIS — Z992 Dependence on renal dialysis: Secondary | ICD-10-CM | POA: Diagnosis not present

## 2021-09-10 DIAGNOSIS — N186 End stage renal disease: Secondary | ICD-10-CM | POA: Diagnosis not present

## 2021-09-10 DIAGNOSIS — L299 Pruritus, unspecified: Secondary | ICD-10-CM | POA: Diagnosis not present

## 2021-09-10 DIAGNOSIS — N2581 Secondary hyperparathyroidism of renal origin: Secondary | ICD-10-CM | POA: Diagnosis not present

## 2021-09-10 DIAGNOSIS — R748 Abnormal levels of other serum enzymes: Secondary | ICD-10-CM | POA: Diagnosis not present

## 2021-09-11 ENCOUNTER — Other Ambulatory Visit: Payer: Self-pay

## 2021-09-11 ENCOUNTER — Ambulatory Visit (INDEPENDENT_AMBULATORY_CARE_PROVIDER_SITE_OTHER): Payer: BC Managed Care – PPO | Admitting: Physical Therapy

## 2021-09-11 DIAGNOSIS — M6281 Muscle weakness (generalized): Secondary | ICD-10-CM

## 2021-09-11 DIAGNOSIS — M21372 Foot drop, left foot: Secondary | ICD-10-CM

## 2021-09-11 DIAGNOSIS — R2689 Other abnormalities of gait and mobility: Secondary | ICD-10-CM | POA: Diagnosis not present

## 2021-09-11 DIAGNOSIS — M25672 Stiffness of left ankle, not elsewhere classified: Secondary | ICD-10-CM

## 2021-09-11 DIAGNOSIS — R2681 Unsteadiness on feet: Secondary | ICD-10-CM | POA: Diagnosis not present

## 2021-09-11 DIAGNOSIS — M79661 Pain in right lower leg: Secondary | ICD-10-CM | POA: Diagnosis not present

## 2021-09-11 NOTE — Therapy (Signed)
[facility] [address] [place], [place], [address] Phone: [phone]   Fax:  [phone]  Physical Therapy Treatment  Patient Details  Name: Duane Hall MRN: [id number] Date of Birth: 23-Jun-1956 Referring Provider (PT): [name], MD   Encounter Date: 09/11/2021   PT End of Session - 09/11/21 1555     Visit Number 7    Number of Visits 20    Date for PT Re-Evaluation 10/04/21    Authorization Type BCBS    Authorization Time Period OOP & Ded MET    PT Start Time 1515    PT Stop Time 1559    PT Time Calculation (min) 44 min    Equipment Utilized During Treatment Gait belt    Activity Tolerance Patient tolerated treatment well    Behavior During Therapy [facility] for tasks assessed/performed             Past Medical History:  Diagnosis Date   Asthma    Cancer ([place])    Cataract    CKD (chronic kidney disease), stage III ([place])    Diabetes mellitus    Glaucoma    Vascular insufficiency 05/2020    Past Surgical History:  Procedure Laterality Date   ABDOMINAL AORTOGRAM W/LOWER EXTREMITY N/A 06/19/2020   Procedure: ABDOMINAL AORTOGRAM W/LOWER EXTREMITY;  Surgeon: Waynetta Sandy, MD;  Location: [place] [facility];  Service: Cardiovascular;  Laterality: N/A;   ABDOMINAL AORTOGRAM W/LOWER EXTREMITY Left 10/16/2020   Procedure: ABDOMINAL AORTOGRAM W/LOWER EXTREMITY;  Surgeon: Waynetta Sandy, MD;  Location: [place] [facility];  Service: Cardiovascular;  Laterality: Left;   ABDOMINAL AORTOGRAM W/LOWER EXTREMITY N/A 12/25/2020   Procedure: ABDOMINAL AORTOGRAM W/LOWER EXTREMITY;  Surgeon: Waynetta Sandy, MD;  Location: [place] [facility];  Service: Cardiovascular;  Laterality: N/A;   AMPUTATION Right 01/12/2021   Procedure: 1ST AMPUTATION RAY;  Surgeon: [name], DPM;  Location: [place];  Service: Podiatry;  Laterality: Right;   AMPUTATION Right 03/13/2021   Procedure: RIGHT BELOW KNEE AMPUTATION;   Surgeon: Waynetta Sandy, MD;  Location: [place];  Service: Vascular;  Laterality: Right;   AV FISTULA PLACEMENT Right 09/14/2019   Procedure: Right Arm Basilic Vein transposition;  Surgeon: Angelia Mould, MD;  Location: [place];  Service: Vascular;  Laterality: Right;   BONE BIOPSY Left 07/29/2020   Procedure: BONE BIOPSY;  Surgeon: [name], DPM;  Location: [place];  Service: Podiatry;  Laterality: Left;  Need bone trephines and/or large bore Giamshidi   COLONOSCOPY     PERIPHERAL VASCULAR ATHERECTOMY Left 06/19/2020   Procedure: PERIPHERAL VASCULAR ATHERECTOMY;  Surgeon: Waynetta Sandy, MD;  Location: [place] [facility];  Service: Cardiovascular;  Laterality: Left;  SFA   PERIPHERAL VASCULAR ATHERECTOMY  12/25/2020   Procedure: PERIPHERAL VASCULAR ATHERECTOMY;  Surgeon: Waynetta Sandy, MD;  Location: [place] [facility];  Service: Cardiovascular;;  Lt. PT - Laser Lt. SFA - Laser   PERIPHERAL VASCULAR BALLOON ANGIOPLASTY  12/25/2020   Procedure: PERIPHERAL VASCULAR BALLOON ANGIOPLASTY;  Surgeon: Waynetta Sandy, MD;  Location: [place] [facility];  Service: Cardiovascular;;  Lt. SFA and PT   UPPER GASTROINTESTINAL ENDOSCOPY  02/2019   Dr Benson Norway      There were no vitals filed for this visit.   Subjective Assessment - 09/11/21 1530     Subjective denies pain or issues with prosthesis other than it may be too short but he had some adjustment made by prosthetist. He states  he has poor balance and he does not think it will get any better and thinks some of these balance exercises are a waste of time.    Patient is accompained by: Family member    Pertinent History ESRD home dialysis, DM, PVD, asthma, glaucoma    Patient Stated Goals Walk with prosthesis at community level.  Get around house without w/c.                               [place] [facility] Adult PT Treatment/Exercise - 09/11/21 0001       Transfers   Transfers Sit to  Stand;Stand to Sit;Floor to Transfer    Sit to Stand 5: Supervision;With upper extremity assist;With armrests;From chair/3-in-1;Other (comment)   needs UE support to stabilize   Stand to Sit 5: Supervision;With upper extremity assist;With armrests;To chair/3-in-1;Other (comment)   needs UE support to stabilize     Ambulation/Gait   Ambulation/Gait Yes    Ambulation/Gait Assistance 4: Min assist   cane & HHA   Ambulation Distance (Feet) 120 Feet   enter/exit w/ rollotor, in clinic w/ cane & intermittent UE support on bars 50 feet   Assistive device Rollator;Rolling walker;Prosthesis;Straight cane;1 person hand held assist   cane stand alone tip & TTA prosthesis, HHA of one parallel bar     High Level Balance   High Level Balance Activities Side stepping;Backward walking;Turns    High Level Balance Comments 5 round trips in bars      Neuro Re-ed    Neuro Re-ed Details  static balance with feet NBOS on airex pad 1 min X3, then removed foam pad for balance NBOS eyes closed 10 sec X8      Knee/Hip Exercises: Aerobic   Nustep Seat 10 Level 6 with BLEs & BUEs 8 min      Knee/Hip Exercises: Machines for Strengthening   Total Gym Leg Press shuttle leg press 125# 15 reps  1st set back 45* and 2nd set 10 reps back flat with alt leg marches      Knee/Hip Exercises: Seated   Sit to Sand 10 reps;without UE support   needed min A for first 2 then able to complete rest CGA                      PT Short Term Goals - 09/04/21 1559       PT SHORT TERM GOAL #1   Title Patient verbalizes how to adjust ply socks with limb volume changes.    Time 4    Period Weeks    Status New    Target Date 09/20/21      PT SHORT TERM GOAL #2   Title Patient tolerates prosthesis >80% of awake hrs total /day without skin issues or limb pain <2/10 after standing.    Time 3    Period Weeks    Status Revised    Target Date 09/20/21      PT SHORT TERM GOAL #3   Title Patient able to look over  shoulders with weight shift & trunk rotation without UE support safely.    Time 3    Period Weeks    Status New    Target Date 09/20/21      PT SHORT TERM GOAL #4   Title Patient ambulates 200' with cane & prosthesis with supervision    Time 3    Period Weeks    Status  Revised    Target Date 09/20/21      PT SHORT TERM GOAL #5   Title Patient negotiates ramps & curbs with cane & prosthesis with minA.    Time 3    Period Weeks    Status Revised    Target Date 09/20/21               PT Long Term Goals - 07/31/21 1632       PT LONG TERM GOAL #1   Title Patient demonstrates & verbalized understanding of prosthetic care to enable safe utilization of prosthesis.    Time 10    Period Weeks    Status On-going    Target Date 10/04/21      PT LONG TERM GOAL #2   Title Patient tolerates prosthesis wear >90% of awake hours without skin or limb pain issues.    Time 10    Period Weeks    Status On-going    Target Date 10/04/21      PT LONG TERM GOAL #3   Title [place] Balance >/= 36/56 to indicate lower fall risk    Time 10    Period Weeks    Status On-going    Target Date 10/04/21      PT LONG TERM GOAL #4   Title Patient ambulates 300' with LRAD & prosthesis modified independent    Time 10    Period Weeks    Status On-going    Target Date 10/04/21      PT LONG TERM GOAL #5   Title Patient negotiates ramps, curbs & stairs with LRAD & prosthesis modified independent.    Time 10    Period Weeks    Status On-going    Target Date 10/04/21                   Plan - 09/11/21 1557     Clinical Impression Statement We attemped ambulation with SPC and HHA but he prefered to do this in the bars and use one intermittent UE support PRN vs ambulating with HHA in clinic. We then performed balance training but then he requested that this was a waste of time so we transitioned to more leg strength and overall endurance activity. I explained why using SPC can be benficial  at least for in home use even if he needs rollator for community ambulation and why balance and endurance is benficial for him.    Personal Factors and Comorbidities Comorbidity 3+;Time since onset of injury/illness/exacerbation    Comorbidities ESRD home dialysis, DM, PVD, asthma, glaucoma    Examination-Activity Limitations Lift;Locomotion Level;Squat;Stairs;Stand;Transfers    Examination-Participation Restrictions Community Activity    Stability/Clinical Decision Making Evolving/Moderate complexity    Rehab Potential Good    PT Frequency 2x / week    PT Duration Other (comment)   10 weeks   PT Treatment/Interventions ADLs/Self Care Home Management;DME Instruction;Gait training;Stair training;Functional mobility training;Therapeutic activities;Therapeutic exercise;Balance training;Neuromuscular re-education;Patient/family education;Prosthetic Training;Manual techniques;Passive range of motion;Vestibular    PT Next Visit Plan review prosthetic care, work towards updated STGs,  prosthetic gait    Consulted and Agree with Plan of Care Patient    Family Member Consulted --             Patient will benefit from skilled therapeutic intervention in order to improve the following deficits and impairments:  Abnormal gait, Decreased activity tolerance, Decreased balance, Decreased endurance, Decreased knowledge of use of DME, Decreased mobility, Decreased range of motion, Decreased strength,  Increased edema, Impaired flexibility, Postural dysfunction, Prosthetic Dependency, Pain  Visit Diagnosis: Unsteadiness on feet  Pain in right lower leg  Other abnormalities of gait and mobility  Muscle weakness (generalized)  Foot drop, left  Stiffness of left ankle, not elsewhere classified     Problem List Patient Active Problem List   Diagnosis Date Noted   Degeneration of lumbar intervertebral disc 05/18/2021   Drug induced constipation    Thrombocytopenia (HCC)    Labile blood pressure     Post-operative pain    S/P BKA (below knee amputation) unilateral, right (HCC)    Right BKA infection (HCC)    Gangrene of right foot ([place]) 03/16/2021   Acquired absence of right leg below knee ([place]) 03/14/2021   Peripheral arterial disease (HCC)    Gangrene ([place])    Osteomyelitis of right foot ([place])    Sepsis ([place]) 01/11/2021   Cellulitis of right foot 01/11/2021   Lumbar spondylosis 12/26/2020   Lumbar radiculopathy 10/05/2020   Left atrial dilation 08/12/2020   Lumbar back pain 08/12/2020   Acute osteomyelitis of left calcaneus ([place])    Bilateral pseudophakia 07/28/2020   Stable proliferative diabetic retinopathy of both eyes associated with type 2 diabetes mellitus ([place]) 07/28/2020   Diabetic foot ulcer ([place]) 07/27/2020   ESRD on hemodialysis ([place]) 07/27/2020   Gastroparesis diabeticorum ([place]) 07/02/2020   Vascular insufficiency of extremity 06/18/2020   Awaiting transplantation of kidney 06/18/2020   Foot ulcer, left ([place]) 06/18/2020   Dependence on renal dialysis ([place]) 05/01/2020   Cramp and spasm 05/01/2020   Hemodialysis-associated hypotension 05/01/2020   Aortic atherosclerosis ([place]) 02/28/2020   Hypoglycemia, unspecified 12/20/2019   Iron deficiency anemia, unspecified 11/17/2019   Allergy, unspecified, initial encounter 10/18/2019   Type 2 diabetes mellitus with diabetic peripheral angiopathy without gangrene ([place]) 10/16/2019   Other disorders of phosphorus metabolism 10/14/2019   Secondary hyperparathyroidism of renal origin ([place]) 91/63/8466   Complication of vascular dialysis catheter 10/09/2019   Hypercalcemia 10/07/2019   Hyperlipidemia, unspecified 10/07/2019   Tobacco use 10/07/2019   Unspecified glaucoma 10/07/2019   Encounter for immunization 10/06/2019   Coagulation defect, unspecified ([place]) 10/05/2019   ESRD (end stage renal disease) ([place]) 10/05/2019   Hypokalemia 09/09/2019   Other emphysema ([place]) 11/23/2018   Benign prostatic hyperplasia with weak  urinary stream 11/18/2018   Pure hypercholesterolemia 11/18/2018   Uncontrolled type 2 diabetes mellitus with hyperglycemia ([place]) 11/18/2018   Diabetic retinopathy ([place]) 08/25/2015   Claudication ([place]) 04/07/2015   Anemia of chronic disease 09/12/2014   Hepatitis C 10/15/2013   Pain in joint, shoulder region 02/10/2013   Diabetes mellitus type 2 in nonobese [facility] [place]) 02/24/2012   Hypertension 02/24/2012   Asthma 02/24/2012    Debbe Odea, PT,DPT 09/11/2021, 4:20 PM  [facility] [address] [place], [place], [address] Phone: [phone]   Fax:  [phone]  Name: HOWARD BUNTE MRN: [id number] Date of Birth: August 27, 1956

## 2021-09-12 DIAGNOSIS — N2581 Secondary hyperparathyroidism of renal origin: Secondary | ICD-10-CM | POA: Diagnosis not present

## 2021-09-12 DIAGNOSIS — Z992 Dependence on renal dialysis: Secondary | ICD-10-CM | POA: Diagnosis not present

## 2021-09-12 DIAGNOSIS — N186 End stage renal disease: Secondary | ICD-10-CM | POA: Diagnosis not present

## 2021-09-13 DIAGNOSIS — N186 End stage renal disease: Secondary | ICD-10-CM | POA: Diagnosis not present

## 2021-09-13 DIAGNOSIS — Z992 Dependence on renal dialysis: Secondary | ICD-10-CM | POA: Diagnosis not present

## 2021-09-13 DIAGNOSIS — N2581 Secondary hyperparathyroidism of renal origin: Secondary | ICD-10-CM | POA: Diagnosis not present

## 2021-09-14 ENCOUNTER — Encounter: Payer: Self-pay | Admitting: Endocrinology

## 2021-09-14 DIAGNOSIS — Z8619 Personal history of other infectious and parasitic diseases: Secondary | ICD-10-CM | POA: Diagnosis not present

## 2021-09-14 DIAGNOSIS — R748 Abnormal levels of other serum enzymes: Secondary | ICD-10-CM | POA: Diagnosis not present

## 2021-09-14 DIAGNOSIS — L299 Pruritus, unspecified: Secondary | ICD-10-CM | POA: Diagnosis not present

## 2021-09-16 DIAGNOSIS — N186 End stage renal disease: Secondary | ICD-10-CM | POA: Diagnosis not present

## 2021-09-16 DIAGNOSIS — N2581 Secondary hyperparathyroidism of renal origin: Secondary | ICD-10-CM | POA: Diagnosis not present

## 2021-09-16 DIAGNOSIS — Z992 Dependence on renal dialysis: Secondary | ICD-10-CM | POA: Diagnosis not present

## 2021-09-17 DIAGNOSIS — Z992 Dependence on renal dialysis: Secondary | ICD-10-CM | POA: Diagnosis not present

## 2021-09-17 DIAGNOSIS — N2581 Secondary hyperparathyroidism of renal origin: Secondary | ICD-10-CM | POA: Diagnosis not present

## 2021-09-17 DIAGNOSIS — N186 End stage renal disease: Secondary | ICD-10-CM | POA: Diagnosis not present

## 2021-09-18 ENCOUNTER — Encounter: Payer: Self-pay | Admitting: Physical Therapy

## 2021-09-18 ENCOUNTER — Other Ambulatory Visit: Payer: Self-pay

## 2021-09-18 ENCOUNTER — Ambulatory Visit (INDEPENDENT_AMBULATORY_CARE_PROVIDER_SITE_OTHER): Payer: BC Managed Care – PPO | Admitting: Physical Therapy

## 2021-09-18 DIAGNOSIS — M6281 Muscle weakness (generalized): Secondary | ICD-10-CM

## 2021-09-18 DIAGNOSIS — R2689 Other abnormalities of gait and mobility: Secondary | ICD-10-CM

## 2021-09-18 DIAGNOSIS — R2681 Unsteadiness on feet: Secondary | ICD-10-CM | POA: Diagnosis not present

## 2021-09-18 NOTE — Telephone Encounter (Signed)
Pt calling in moved app to 09/20/2021 at 8:30am. Pt voiced that sugar was the lowest at 213 yesterday 09/17/2021.Sugar today 09/18/2021 [phone]. Pt would like a call back. Pt states that we can connect his Elenor Legato to see his readings for the last month  if needed.

## 2021-09-18 NOTE — Therapy (Signed)
[facility] Physical Therapy [address] [place], [place], [address] Phone: [phone]   Fax:  [phone]  Physical Therapy Treatment  Patient Details  Name: Duane Hall MRN: [id number] Date of Birth: Mar 13, 1956 Referring Provider (PT): [name], MD   Encounter Date: 09/18/2021   PT End of Session - 09/18/21 1611     Visit Number 8    Number of Visits 20    Date for PT Re-Evaluation 10/04/21    Authorization Type BCBS    Authorization Time Period OOP & Ded MET    PT Start Time 1515    PT Stop Time 1559    PT Time Calculation (min) 44 min    Equipment Utilized During Treatment Gait belt    Activity Tolerance Patient tolerated treatment well    Behavior During Therapy [facility] for tasks assessed/performed             Past Medical History:  Diagnosis Date   Asthma    Cancer ([place])    Cataract    CKD (chronic kidney disease), stage III ([place])    Diabetes mellitus    Glaucoma    Vascular insufficiency 05/2020    Past Surgical History:  Procedure Laterality Date   ABDOMINAL AORTOGRAM W/LOWER EXTREMITY N/A 06/19/2020   Procedure: ABDOMINAL AORTOGRAM W/LOWER EXTREMITY;  Surgeon: Waynetta Sandy, MD;  Location: [place] [facility];  Service: Cardiovascular;  Laterality: N/A;   ABDOMINAL AORTOGRAM W/LOWER EXTREMITY Left 10/16/2020   Procedure: ABDOMINAL AORTOGRAM W/LOWER EXTREMITY;  Surgeon: Waynetta Sandy, MD;  Location: [place] [facility];  Service: Cardiovascular;  Laterality: Left;   ABDOMINAL AORTOGRAM W/LOWER EXTREMITY N/A 12/25/2020   Procedure: ABDOMINAL AORTOGRAM W/LOWER EXTREMITY;  Surgeon: Waynetta Sandy, MD;  Location: [place] [facility];  Service: Cardiovascular;  Laterality: N/A;   AMPUTATION Right 01/12/2021   Procedure: 1ST AMPUTATION RAY;  Surgeon: [name], DPM;  Location: [place];  Service: Podiatry;  Laterality: Right;   AMPUTATION Right 03/13/2021   Procedure: RIGHT BELOW KNEE AMPUTATION;   Surgeon: Waynetta Sandy, MD;  Location: [place];  Service: Vascular;  Laterality: Right;   AV FISTULA PLACEMENT Right 09/14/2019   Procedure: Right Arm Basilic Vein transposition;  Surgeon: Angelia Mould, MD;  Location: [place];  Service: Vascular;  Laterality: Right;   BONE BIOPSY Left 07/29/2020   Procedure: BONE BIOPSY;  Surgeon: [name], DPM;  Location: [place];  Service: Podiatry;  Laterality: Left;  Need bone trephines and/or large bore Giamshidi   COLONOSCOPY     PERIPHERAL VASCULAR ATHERECTOMY Left 06/19/2020   Procedure: PERIPHERAL VASCULAR ATHERECTOMY;  Surgeon: Waynetta Sandy, MD;  Location: [place] [facility];  Service: Cardiovascular;  Laterality: Left;  SFA   PERIPHERAL VASCULAR ATHERECTOMY  12/25/2020   Procedure: PERIPHERAL VASCULAR ATHERECTOMY;  Surgeon: Waynetta Sandy, MD;  Location: [place] [facility];  Service: Cardiovascular;;  Lt. PT - Laser Lt. SFA - Laser   PERIPHERAL VASCULAR BALLOON ANGIOPLASTY  12/25/2020   Procedure: PERIPHERAL VASCULAR BALLOON ANGIOPLASTY;  Surgeon: Waynetta Sandy, MD;  Location: [place] [facility];  Service: Cardiovascular;;  Lt. SFA and PT   UPPER GASTROINTESTINAL ENDOSCOPY  02/2019   Dr Benson Norway      There were no vitals filed for this visit.   Subjective Assessment - 09/18/21 1515     Subjective Patient reports that he was sick last week so did not wear prosthesis those days.  He feels that doing balance exercises will  not help as his balance has been off since childhood.    Patient is accompained by: Family member    Pertinent History ESRD home dialysis, DM, PVD, asthma, glaucoma    Patient Stated Goals Walk with prosthesis at community level.  Get around house without w/c.    Currently in Pain? No/denies                [facility] PT Assessment - 09/18/21 1515       Berg Balance Test   Sit to Stand Able to stand  independently using hands    Standing Unsupported Able to stand 2  minutes with supervision    Sitting with Back Unsupported but Feet Supported on Floor or Stool Able to sit safely and securely 2 minutes    Stand to Sit Controls descent by using hands    Transfers Able to transfer safely, definite need of hands    Standing Unsupported with Eyes Closed Able to stand 10 seconds with supervision    Standing Unsupported with Feet Together Needs help to attain position but able to stand for 30 seconds with feet together    From Standing, Reach Forward with Outstretched Arm Can reach forward >5 cm safely (2")    From Standing Position, Pick up Object from Floor Unable to pick up and needs supervision    From Standing Position, Turn to Look Behind Over each Shoulder Needs supervision when turning    Turn 360 Degrees Needs assistance while turning    Standing Unsupported, Alternately Place Feet on Step/Stool Needs assistance to keep from falling or unable to try    Standing Unsupported, One Foot in Front Needs help to step but can hold 15 seconds    Standing on One Leg Unable to try or needs assist to prevent fall    Total Score 25                           [facility] Adult PT Treatment/Exercise - 09/18/21 1515       Transfers   Transfers Sit to Stand;Stand to Sit;Floor to Transfer    Sit to Stand 5: Supervision;With upper extremity assist;With armrests;From chair/3-in-1;Other (comment)   able to stabilize without touching RW but close for safety   Stand to Sit 5: Supervision;With upper extremity assist;With armrests;To chair/3-in-1;Other (comment)   able to stabilize without touching RW but close for safety     Ambulation/Gait   Ambulation/Gait Yes    Ambulation/Gait Assistance 4: Min assist;6: Modified independent (Device/Increase time)   cane minA & rollator mod ind   Ambulation/Gait Assistance Details demo, verbal & tactile cues on sequence & balance reactions.    Ambulation Distance (Feet) 120 Feet   enter/exit w/ rollotor, in clinic w/ cane 75'    Assistive device Rollator;Rolling walker;Prosthesis;Straight cane   cane stand alone tip & TTA prosthesis     High Level Balance   High Level Balance Activities --    High Level Balance Comments --      Neuro Re-ed    Neuro Re-ed Details  --      Knee/Hip Exercises: Aerobic   Nustep Seat 9 Level 7 with BLEs & BUEs 8 min      Knee/Hip Exercises: Machines for Strengthening   Total Gym Leg Press --      Knee/Hip Exercises: Seated   Sit to Sand --      Prosthetics   Current prosthetic wear tolerance (days/week)  daily  except days too sick to get out of bed    Current prosthetic wear tolerance (#hours/day)  within 1 hour of arising, removes midday for 2-3 hrs and removes within 1 hour of bedtime.    Current prosthetic weight-bearing tolerance (hours/day)  patient tolerated standing for 5-8 min with partial weight on prosthesis with no residual limb pain    Residual limb condition  no open areas, no hair growth, normal color & temperature, cylinderical shape, distal 2-3" of limb without tibia or bone structure.                       PT Short Term Goals - 09/04/21 1559       PT SHORT TERM GOAL #1   Title Patient verbalizes how to adjust ply socks with limb volume changes.    Time 4    Period Weeks    Status New    Target Date 09/20/21      PT SHORT TERM GOAL #2   Title Patient tolerates prosthesis >80% of awake hrs total /day without skin issues or limb pain <2/10 after standing.    Time 3    Period Weeks    Status Revised    Target Date 09/20/21      PT SHORT TERM GOAL #3   Title Patient able to look over shoulders with weight shift & trunk rotation without UE support safely.    Time 3    Period Weeks    Status New    Target Date 09/20/21      PT SHORT TERM GOAL #4   Title Patient ambulates 200' with cane & prosthesis with supervision    Time 3    Period Weeks    Status Revised    Target Date 09/20/21      PT SHORT TERM GOAL #5   Title Patient  negotiates ramps & curbs with cane & prosthesis with minA.    Time 3    Period Weeks    Status Revised    Target Date 09/20/21               PT Long Term Goals - 07/31/21 1632       PT LONG TERM GOAL #1   Title Patient demonstrates & verbalized understanding of prosthetic care to enable safe utilization of prosthesis.    Time 10    Period Weeks    Status On-going    Target Date 10/04/21      PT LONG TERM GOAL #2   Title Patient tolerates prosthesis wear >90% of awake hours without skin or limb pain issues.    Time 10    Period Weeks    Status On-going    Target Date 10/04/21      PT LONG TERM GOAL #3   Title Berg Balance >/= 36/56 to indicate lower fall risk    Time 10    Period Weeks    Status On-going    Target Date 10/04/21      PT LONG TERM GOAL #4   Title Patient ambulates 300' with LRAD & prosthesis modified independent    Time 10    Period Weeks    Status On-going    Target Date 10/04/21      PT LONG TERM GOAL #5   Title Patient negotiates ramps, curbs & stairs with LRAD & prosthesis modified independent.    Time 10    Period Weeks    Status On-going  Target Date 10/04/21                   Plan - 09/18/21 1611     Clinical Impression Statement PT checked Berg Balance and improved to 25/56 from 10/56. He still has high fall risk with score <45/56.  Pt improved his prosthetic gait with cane in that he did not require HHA today.  Pt, his wife & PT had long discussion on expected outcomes, LTGs and benefits of regular exercise that includes endurance, strength, flexibility & balance.    Personal Factors and Comorbidities Comorbidity 3+;Time since onset of injury/illness/exacerbation    Comorbidities ESRD home dialysis, DM, PVD, asthma, glaucoma    Examination-Activity Limitations Lift;Locomotion Level;Squat;Stairs;Stand;Transfers    Examination-Participation Restrictions Community Activity    Stability/Clinical Decision Making  Evolving/Moderate complexity    Rehab Potential Good    PT Frequency 2x / week    PT Duration Other (comment)   10 weeks   PT Treatment/Interventions ADLs/Self Care Home Management;DME Instruction;Gait training;Stair training;Functional mobility training;Therapeutic activities;Therapeutic exercise;Balance training;Neuromuscular re-education;Patient/family education;Prosthetic Training;Manual techniques;Passive range of motion;Vestibular    PT Next Visit Plan instruct in theraband LE & UE exercises for home,  prosthetic gait household with cane.    Consulted and Agree with Plan of Care Patient;Family member/caregiver    Family Member Consulted wife             Patient will benefit from skilled therapeutic intervention in order to improve the following deficits and impairments:  Abnormal gait, Decreased activity tolerance, Decreased balance, Decreased endurance, Decreased knowledge of use of DME, Decreased mobility, Decreased range of motion, Decreased strength, Increased edema, Impaired flexibility, Postural dysfunction, Prosthetic Dependency, Pain  Visit Diagnosis: Unsteadiness on feet  Other abnormalities of gait and mobility  Muscle weakness (generalized)     Problem List Patient Active Problem List   Diagnosis Date Noted   Degeneration of lumbar intervertebral disc 05/18/2021   Drug induced constipation    Thrombocytopenia (HCC)    Labile blood pressure    Post-operative pain    S/P BKA (below knee amputation) unilateral, right (HCC)    Right BKA infection (HCC)    Gangrene of right foot ([place]) 03/16/2021   Acquired absence of right leg below knee ([place]) 03/14/2021   Peripheral arterial disease ([place])    Gangrene ([place])    Osteomyelitis of right foot ([place])    Sepsis ([place]) 01/11/2021   Cellulitis of right foot 01/11/2021   Lumbar spondylosis 12/26/2020   Lumbar radiculopathy 10/05/2020   Left atrial dilation 08/12/2020   Lumbar back pain 08/12/2020   Acute osteomyelitis  of left calcaneus ([place])    Bilateral pseudophakia 07/28/2020   Stable proliferative diabetic retinopathy of both eyes associated with type 2 diabetes mellitus ([place]) 07/28/2020   Diabetic foot ulcer ([place]) 07/27/2020   ESRD on hemodialysis ([place]) 07/27/2020   Gastroparesis diabeticorum ([place]) 07/02/2020   Vascular insufficiency of extremity 06/18/2020   Awaiting transplantation of kidney 06/18/2020   Foot ulcer, left ([place]) 06/18/2020   Dependence on renal dialysis ([place]) 05/01/2020   Cramp and spasm 05/01/2020   Hemodialysis-associated hypotension 05/01/2020   Aortic atherosclerosis ([place]) 02/28/2020   Hypoglycemia, unspecified 12/20/2019   Iron deficiency anemia, unspecified 11/17/2019   Allergy, unspecified, initial encounter 10/18/2019   Type 2 diabetes mellitus with diabetic peripheral angiopathy without gangrene ([place]) 10/16/2019   Other disorders of phosphorus metabolism 10/14/2019   Secondary hyperparathyroidism of renal origin ([place]) 68/34/1962   Complication of vascular dialysis catheter 10/09/2019  Hypercalcemia 10/07/2019   Hyperlipidemia, unspecified 10/07/2019   Tobacco use 10/07/2019   Unspecified glaucoma 10/07/2019   Encounter for immunization 10/06/2019   Coagulation defect, unspecified ([place]) 10/05/2019   ESRD (end stage renal disease) ([place]) 10/05/2019   Hypokalemia 09/09/2019   Other emphysema ([place]) 11/23/2018   Benign prostatic hyperplasia with weak urinary stream 11/18/2018   Pure hypercholesterolemia 11/18/2018   Uncontrolled type 2 diabetes mellitus with hyperglycemia ([place]) 11/18/2018   Diabetic retinopathy ([place]) 08/25/2015   Claudication ([place]) 04/07/2015   Anemia of chronic disease 09/12/2014   Hepatitis C 10/15/2013   Pain in joint, shoulder region 02/10/2013   Diabetes mellitus type 2 in nonobese [place]) 02/24/2012   Hypertension 02/24/2012   Asthma 02/24/2012    Jamey Reas, PT, DPT 09/18/2021, 4:15 PM  [facility] Physical Therapy [address] [place], [place], [address] Phone: [phone]   Fax:  [phone]  Name: Duane Hall MRN: [id number] Date of Birth: 06-08-1956

## 2021-09-19 DIAGNOSIS — Z992 Dependence on renal dialysis: Secondary | ICD-10-CM | POA: Diagnosis not present

## 2021-09-19 DIAGNOSIS — N186 End stage renal disease: Secondary | ICD-10-CM | POA: Diagnosis not present

## 2021-09-19 DIAGNOSIS — N2581 Secondary hyperparathyroidism of renal origin: Secondary | ICD-10-CM | POA: Diagnosis not present

## 2021-09-20 ENCOUNTER — Other Ambulatory Visit: Payer: Self-pay

## 2021-09-20 ENCOUNTER — Encounter: Payer: Self-pay | Admitting: Endocrinology

## 2021-09-20 ENCOUNTER — Ambulatory Visit (INDEPENDENT_AMBULATORY_CARE_PROVIDER_SITE_OTHER): Payer: BC Managed Care – PPO | Admitting: Endocrinology

## 2021-09-20 VITALS — BP 100/60 | HR 100 | Ht 69.0 in | Wt 168.0 lb

## 2021-09-20 DIAGNOSIS — E119 Type 2 diabetes mellitus without complications: Secondary | ICD-10-CM | POA: Diagnosis not present

## 2021-09-20 DIAGNOSIS — N186 End stage renal disease: Secondary | ICD-10-CM | POA: Diagnosis not present

## 2021-09-20 DIAGNOSIS — N2581 Secondary hyperparathyroidism of renal origin: Secondary | ICD-10-CM | POA: Diagnosis not present

## 2021-09-20 DIAGNOSIS — Z992 Dependence on renal dialysis: Secondary | ICD-10-CM | POA: Diagnosis not present

## 2021-09-20 LAB — POCT GLYCOSYLATED HEMOGLOBIN (HGB A1C): Hemoglobin A1C: 9.1 % — AB (ref 4.0–5.6)

## 2021-09-20 NOTE — Progress Notes (Signed)
Subjective:    Patient ID: Duane Hall, male    DOB: [birth year], 65 y.o.   MRN: [id number]  HPI Pt returns for f/u of diabetes mellitus: DM type: Insulin-requiring type 2, but he is at risk for evolving type 1.  Dx'ed: 3354 Complications: ESRD, foot ulcers, GP, PAD, and PDR.   Therapy: insulin since 2021 (Omnipod Dash since 1/22), and [place] CGM.   DKA: never Severe hypoglycemia: never.  Pancreatitis: never Pancreatic imaging:  SDOH: Wife provides most hx, due to pt's poor overall health; he eats meals at 10AM, (sometimes 2PM), and 6PM; He eats small breakfast and lunch, and a larger dinner.  Other: fructosamine converts to higher avg glucose than A1c, prob due to renal failure; he has [place] continuous glucose monitor; he stopped Trulicity, due to GP; he eats meals at 10AM, 1PM, and 6PM.  He often skips lunch; he has very long duration of action of insulin.  Interval history:  He uses these pump settings:  basal rate of 0.15 units/hr. bolus of 1 unit/8 grams carbohydrate.   correction bolus (which some people call "sensitivity," or "insulin sensitivity ratio," or just "isr") of 1 unit for each 100 by which your glucose exceeds 120.   TDD is 25 units (88% bolus).  He takes 4.3 boluses per day I reviewed continuous glucose monitor data.  Glucose varies from 150-400 (avg 300).  It is in general lowest at [facility], and highest at [facility].  However, there is little trend throughout the day. Pt says some meal and correction boluses continue to be rejected, due to insulin on board.   Past Medical History:  Diagnosis Date   Asthma    Cancer ([place])    Cataract    CKD (chronic kidney disease), stage III ([place])    Diabetes mellitus    Glaucoma    Vascular insufficiency 05/2020    Past Surgical History:  Procedure Laterality Date   ABDOMINAL AORTOGRAM W/LOWER EXTREMITY N/A 06/19/2020   Procedure: ABDOMINAL AORTOGRAM W/LOWER EXTREMITY;  Surgeon: Waynetta Sandy, MD;  Location: [place] [facility];  Service: Cardiovascular;  Laterality: N/A;   ABDOMINAL AORTOGRAM W/LOWER EXTREMITY Left 10/16/2020   Procedure: ABDOMINAL AORTOGRAM W/LOWER EXTREMITY;  Surgeon: Waynetta Sandy, MD;  Location: [place] [facility];  Service: Cardiovascular;  Laterality: Left;   ABDOMINAL AORTOGRAM W/LOWER EXTREMITY N/A 12/25/2020   Procedure: ABDOMINAL AORTOGRAM W/LOWER EXTREMITY;  Surgeon: Waynetta Sandy, MD;  Location: [place] [facility];  Service: Cardiovascular;  Laterality: N/A;   AMPUTATION Right 01/12/2021   Procedure: 1ST AMPUTATION RAY;  Surgeon: [name], DPM;  Location: [place];  Service: Podiatry;  Laterality: Right;   AMPUTATION Right 03/13/2021   Procedure: RIGHT BELOW KNEE AMPUTATION;  Surgeon: Waynetta Sandy, MD;  Location: [place];  Service: Vascular;  Laterality: Right;   AV FISTULA PLACEMENT Right 09/14/2019   Procedure: Right Arm Basilic Vein transposition;  Surgeon: Angelia Mould, MD;  Location: [place];  Service: Vascular;  Laterality: Right;   BONE BIOPSY Left 07/29/2020   Procedure: BONE BIOPSY;  Surgeon: [name], DPM;  Location: [place];  Service: Podiatry;  Laterality: Left;  Need bone trephines and/or large bore Giamshidi   COLONOSCOPY     PERIPHERAL VASCULAR ATHERECTOMY Left 06/19/2020   Procedure: PERIPHERAL VASCULAR ATHERECTOMY;  Surgeon: Waynetta Sandy, MD;  Location: [place] [facility];  Service: Cardiovascular;  Laterality: Left;  SFA   PERIPHERAL VASCULAR ATHERECTOMY  12/25/2020   Procedure: PERIPHERAL  VASCULAR ATHERECTOMY;  Surgeon: Waynetta Sandy, MD;  Location: [place] [facility];  Service: Cardiovascular;;  Lt. PT - Laser Lt. SFA - Laser   PERIPHERAL VASCULAR BALLOON ANGIOPLASTY  12/25/2020   Procedure: PERIPHERAL VASCULAR BALLOON ANGIOPLASTY;  Surgeon: Waynetta Sandy, MD;  Location: [place] [facility];  Service: Cardiovascular;;  Lt. SFA and PT   UPPER GASTROINTESTINAL ENDOSCOPY  02/2019   Dr  Benson Norway      Social History   Socioeconomic History   Marital status: Married    Spouse name: Not on file   Number of children: Not on file   Years of education: Not on file   Highest education level: Not on file  Occupational History   Occupation: Chief Financial Officer  Tobacco Use   Smoking status: Every Day    Packs/day: 0.30    Types: Cigarettes   Smokeless tobacco: Never  Vaping Use   Vaping Use: Never used  Substance and Sexual Activity   Alcohol use: No    Alcohol/week: 0.0 standard drinks   Drug use: No   Sexual activity: Not on file  Other Topics Concern   Not on file  Social History Narrative   Married.    Social Determinants of Health   Financial Resource Strain: Not on file  Food Insecurity: Not on file  Transportation Needs: Not on file  Physical Activity: Not on file  Stress: Not on file  Social Connections: Not on file  Intimate Partner Violence: Not on file    Current Outpatient Medications on File Prior to Visit  Medication Sig Dispense Refill   aspirin EC 81 MG EC tablet Take 1 tablet (81 mg total) by mouth daily. Swallow whole. (Patient taking differently: Take 81 mg by mouth every morning. Swallow whole.) 30 tablet 11   clopidogrel (PLAVIX) 75 MG tablet Take 1 tablet (75 mg total) by mouth daily with breakfast. (Patient taking differently: Take 75 mg by mouth every morning.) 30 tablet 3   docusate sodium (COLACE) 100 MG capsule Take 100 mg by mouth every morning.     ferric citrate (AURYXIA) 1 GM 210 MG(Fe) tablet Take 210 mg by mouth 2 (two) times daily with a meal.     Glucagon (GVOKE HYPOPEN 1-PACK) 1 MG/0.2ML SOAJ Inject 1 mg into the skin once as needed (low blood sugar).     Insulin Aspart, w/Niacinamide, (FIASP) 100 UNIT/ML SOLN For use in pump, total of 30 units per day 90 mL 0   Insulin Disposable Pump (OMNIPOD DASH 5 PACK PODS) MISC Use as instructed: use pod every 3 days. 30 each 3   iron sucrose in sodium chloride 0.9 % 100 mL Iron Sucrose (Venofer)  Self Administer at Home     Methoxy PEG-Epoetin Beta (MIRCERA IJ) Inject into the skin.     multivitamin (RENA-VIT) TABS tablet Take 1 tablet by mouth every morning.     pantoprazole (PROTONIX) 40 MG tablet Take 1 tablet (40 mg total) by mouth daily.     rosuvastatin (CRESTOR) 10 MG tablet TAKE 1 TABLET BY MOUTH DAILY (Patient taking differently: Take 10 mg by mouth every morning.) 30 tablet 11   acidophilus (RISAQUAD) CAPS capsule Take 2 capsules by mouth daily.     lidocaine-prilocaine (EMLA) cream Apply 1 application topically See admin instructions. Apply topically to port access one hour prior to dialysis on Sunday, Monday, Wednesday, Thursday     No current facility-administered medications on file prior to visit.    Allergies  Allergen Reactions  Amoxicillin-Pot Clavulanate Diarrhea and Other (See Comments)   Midodrine Other (See Comments)    Other reaction(s): Urinary Sensation   Tape Other (See Comments)    bandaids cause blistering    Family History  Problem Relation Age of Onset   Cancer Father    Diabetes Mother     BP 100/60 (BP Location: Right Arm, Patient Position: Sitting, Cuff Size: Normal)   Pulse 100   Ht 5\' 9"  (1.753 m)   Wt 168 lb (76.2 kg)   SpO2 99%   BMI 24.81 kg/m   Review of Systems He denies hypoglycemia.      Objective:   Physical Exam Pulses: dorsalis pedis intact bilat.   MSK: no deformity of the left foot CV: no left leg edema Skin:  no ulcer on the left foot  normal color and temp on the left foot Neuro: sensation is intact to touch on the left foot, but decreased from normal.   EXT: right BKA  Lab Results  Component Value Date   HGBA1C 9.1 (A) 09/20/2021       Assessment & Plan:  Insulin-requiring type 2 DM: uncontrolled.   Patient Instructions  check your blood sugar 4 times a day: before the 3 meals, and at bedtime.  also check if you have symptoms of your blood sugar being too high or too low.  please keep a record of the  readings and bring it to your next appointment here (or you can bring the meter itself).  You can write it on any piece of paper.  please call us sooner if your blood sugar goes below 70, or if you have a lot of readings over 200.  Please take these settings: basal rate of 0.15 units/hr. bolus of 1 unit/6 grams carbohydrate.   correction bolus (which some people call "sensitivity," or "insulin sensitivity ratio," or just "isr") of 1 unit for each 100 by which your glucose exceeds 120.    Please reduce the active insulin time to 3 hours.  That will prevent the boluses from being rejected.  No one is here today, to help, so please call the tech support number for this.   Please come back for a follow-up appointment in 2 months.     Addendum: pt sees CDE today, who increases active insulin time.  Pt will see CDE next week.

## 2021-09-20 NOTE — Patient Instructions (Addendum)
check your blood sugar 4 times a day: before the 3 meals, and at bedtime.  also check if you have symptoms of your blood sugar being too high or too low.  please keep a record of the readings and bring it to your next appointment here (or you can bring the meter itself).  You can write it on any piece of paper.  please call us sooner if your blood sugar goes below 70, or if you have a lot of readings over 200.  Please take these settings: basal rate of 0.15 units/hr. bolus of 1 unit/6 grams carbohydrate.   correction bolus (which some people call "sensitivity," or "insulin sensitivity ratio," or just "isr") of 1 unit for each 100 by which your glucose exceeds 120.    Please reduce the active insulin time to 3 hours.  That will prevent the boluses from being rejected.  No one is here today, to help, so please call the tech support number for this.   Please come back for a follow-up appointment in 2 months.

## 2021-09-21 ENCOUNTER — Ambulatory Visit: Payer: BC Managed Care – PPO | Admitting: Endocrinology

## 2021-09-21 ENCOUNTER — Ambulatory Visit: Payer: BC Managed Care – PPO | Admitting: Dietician

## 2021-09-21 NOTE — Progress Notes (Signed)
Patient seen 09/20/21 as a walk in Per Dr. Loanne Drilling.  He needed a pump adjustment to change the duration of insulin action from 6 hours to 3 hours.  Change was made per MD orders.  Patient changed is Insulin to Carb ratio from 1:8 to 1:6.  Patient with no further questions.  Antonieta Iba, RD, LDN, CDCES

## 2021-09-23 DIAGNOSIS — N2581 Secondary hyperparathyroidism of renal origin: Secondary | ICD-10-CM | POA: Diagnosis not present

## 2021-09-23 DIAGNOSIS — N186 End stage renal disease: Secondary | ICD-10-CM | POA: Diagnosis not present

## 2021-09-23 DIAGNOSIS — Z992 Dependence on renal dialysis: Secondary | ICD-10-CM | POA: Diagnosis not present

## 2021-09-24 ENCOUNTER — Other Ambulatory Visit: Payer: Self-pay | Admitting: Gastroenterology

## 2021-09-24 DIAGNOSIS — R7989 Other specified abnormal findings of blood chemistry: Secondary | ICD-10-CM

## 2021-09-24 DIAGNOSIS — Z992 Dependence on renal dialysis: Secondary | ICD-10-CM | POA: Diagnosis not present

## 2021-09-24 DIAGNOSIS — L299 Pruritus, unspecified: Secondary | ICD-10-CM | POA: Diagnosis not present

## 2021-09-24 DIAGNOSIS — K8020 Calculus of gallbladder without cholecystitis without obstruction: Secondary | ICD-10-CM | POA: Diagnosis not present

## 2021-09-24 DIAGNOSIS — R7401 Elevation of levels of liver transaminase levels: Secondary | ICD-10-CM | POA: Diagnosis not present

## 2021-09-24 DIAGNOSIS — N2581 Secondary hyperparathyroidism of renal origin: Secondary | ICD-10-CM | POA: Diagnosis not present

## 2021-09-24 DIAGNOSIS — Z86010 Personal history of colonic polyps: Secondary | ICD-10-CM | POA: Diagnosis not present

## 2021-09-24 DIAGNOSIS — N186 End stage renal disease: Secondary | ICD-10-CM | POA: Diagnosis not present

## 2021-09-25 ENCOUNTER — Ambulatory Visit: Payer: BC Managed Care – PPO | Admitting: Endocrinology

## 2021-09-25 ENCOUNTER — Other Ambulatory Visit: Payer: Self-pay

## 2021-09-25 ENCOUNTER — Encounter: Payer: BC Managed Care – PPO | Attending: Endocrinology | Admitting: Nutrition

## 2021-09-25 ENCOUNTER — Encounter: Payer: Self-pay | Admitting: Endocrinology

## 2021-09-25 ENCOUNTER — Telehealth: Payer: Self-pay | Admitting: Nutrition

## 2021-09-25 DIAGNOSIS — E1122 Type 2 diabetes mellitus with diabetic chronic kidney disease: Secondary | ICD-10-CM | POA: Diagnosis not present

## 2021-09-25 DIAGNOSIS — Z992 Dependence on renal dialysis: Secondary | ICD-10-CM | POA: Insufficient documentation

## 2021-09-25 DIAGNOSIS — N186 End stage renal disease: Secondary | ICD-10-CM | POA: Diagnosis not present

## 2021-09-25 NOTE — Telephone Encounter (Signed)
Patient says blood sugars are still high, and the pump is not giving him enough insulin despite counting carbs accurately.  He is not able to do corrections doses due to IOB.  He therefore gives boluses without eating to get extra insulin.  He feels like he needs a stronger correction dose to give him more insulin when high. [place] and [employer] and put on Dr. Cordelia Pen desk. He also wanted to discuss the OmniPod 5 pumps and likes the idea that this pump will be giving him extra basal and insulin every 3 minutes as needed.  He is willing to try this and change to the [facility] sensor.  He was shown the sensor and is agreeable to use this in place of his [place].

## 2021-09-25 NOTE — Telephone Encounter (Signed)
Patient talked through making the changes to his basal rate to 0.3u/hr per Dr. Rosario Adie order

## 2021-09-26 ENCOUNTER — Other Ambulatory Visit: Payer: Self-pay | Admitting: Endocrinology

## 2021-09-26 ENCOUNTER — Encounter: Payer: BC Managed Care – PPO | Admitting: Physical Therapy

## 2021-09-26 DIAGNOSIS — Z992 Dependence on renal dialysis: Secondary | ICD-10-CM

## 2021-09-26 DIAGNOSIS — Z89511 Acquired absence of right leg below knee: Secondary | ICD-10-CM | POA: Diagnosis not present

## 2021-09-26 DIAGNOSIS — N186 End stage renal disease: Secondary | ICD-10-CM | POA: Diagnosis not present

## 2021-09-26 DIAGNOSIS — N2581 Secondary hyperparathyroidism of renal origin: Secondary | ICD-10-CM | POA: Diagnosis not present

## 2021-09-26 MED ORDER — OMNIPOD 5 DEXG7G6 PODS GEN 5 MISC: 1.0000 | 3 refills | Status: DC

## 2021-09-26 MED ORDER — OMNIPOD 5 DEXG7G6 INTRO GEN 5 KIT: 1.0000 | PACK | Freq: Once | 0 refills | Status: AC

## 2021-09-26 NOTE — Progress Notes (Signed)
Patient wanted information of how the OmniPod 5 pump differs from his Dash pump.  We discussed how he can use his phone to bolus, and how the Dexcom CGM works to send readings to the pod and the pod adjusts insulin basal and bolus amounts to bring down high readings and stop the flow of insulin when readings are dropping low. He reports that he wants to try this.   He also reports that since making insulin setting adjustments last week with Dr. Loanne Drilling, his readings have come down a little, but still very high, and he can not give extra insulin due to IOB.  Dash pump download and [employer] show average readings are 264.  Pt. Reports that he is putting in carbs when not eating to get extra insulin to bring down these readings.  This sometimes cause his blood sugars to drop low.  If he does not do this, readings are in the high 200s ac meals. Note sent to Dr. Loanne Drilling and basal rate increased per phone, to 3.0u/hr.  Pt. Was told to expect a text and phone call from [place] in 2-3 days for cost of new pump/pods He had no final questions

## 2021-09-26 NOTE — Patient Instructions (Signed)
Call if one week if blood sugar readings remain high, or drop low.  Call when you receive your new pump and Dexcom..  Make sure that you have your OmniPod and podder's central accounts set up before coming in to start your pump

## 2021-09-27 DIAGNOSIS — N2581 Secondary hyperparathyroidism of renal origin: Secondary | ICD-10-CM | POA: Diagnosis not present

## 2021-09-27 DIAGNOSIS — N186 End stage renal disease: Secondary | ICD-10-CM | POA: Diagnosis not present

## 2021-09-27 DIAGNOSIS — Z992 Dependence on renal dialysis: Secondary | ICD-10-CM | POA: Diagnosis not present

## 2021-09-28 DIAGNOSIS — K8020 Calculus of gallbladder without cholecystitis without obstruction: Secondary | ICD-10-CM | POA: Diagnosis not present

## 2021-09-28 DIAGNOSIS — Z992 Dependence on renal dialysis: Secondary | ICD-10-CM | POA: Diagnosis not present

## 2021-09-28 DIAGNOSIS — K7460 Unspecified cirrhosis of liver: Secondary | ICD-10-CM | POA: Diagnosis not present

## 2021-09-28 DIAGNOSIS — N186 End stage renal disease: Secondary | ICD-10-CM | POA: Diagnosis not present

## 2021-09-28 DIAGNOSIS — E1129 Type 2 diabetes mellitus with other diabetic kidney complication: Secondary | ICD-10-CM | POA: Diagnosis not present

## 2021-09-30 DIAGNOSIS — N2581 Secondary hyperparathyroidism of renal origin: Secondary | ICD-10-CM | POA: Diagnosis not present

## 2021-09-30 DIAGNOSIS — N186 End stage renal disease: Secondary | ICD-10-CM | POA: Diagnosis not present

## 2021-09-30 DIAGNOSIS — Z992 Dependence on renal dialysis: Secondary | ICD-10-CM | POA: Diagnosis not present

## 2021-10-01 ENCOUNTER — Encounter: Payer: Self-pay | Admitting: Physical Therapy

## 2021-10-01 ENCOUNTER — Ambulatory Visit (INDEPENDENT_AMBULATORY_CARE_PROVIDER_SITE_OTHER): Payer: BC Managed Care – PPO | Admitting: Physical Therapy

## 2021-10-01 ENCOUNTER — Other Ambulatory Visit: Payer: Self-pay

## 2021-10-01 DIAGNOSIS — R2689 Other abnormalities of gait and mobility: Secondary | ICD-10-CM | POA: Diagnosis not present

## 2021-10-01 DIAGNOSIS — M21372 Foot drop, left foot: Secondary | ICD-10-CM

## 2021-10-01 DIAGNOSIS — M25672 Stiffness of left ankle, not elsewhere classified: Secondary | ICD-10-CM

## 2021-10-01 DIAGNOSIS — R293 Abnormal posture: Secondary | ICD-10-CM

## 2021-10-01 DIAGNOSIS — R2681 Unsteadiness on feet: Secondary | ICD-10-CM | POA: Diagnosis not present

## 2021-10-01 DIAGNOSIS — M6281 Muscle weakness (generalized): Secondary | ICD-10-CM | POA: Diagnosis not present

## 2021-10-01 DIAGNOSIS — M79661 Pain in right lower leg: Secondary | ICD-10-CM

## 2021-10-01 DIAGNOSIS — N2581 Secondary hyperparathyroidism of renal origin: Secondary | ICD-10-CM | POA: Diagnosis not present

## 2021-10-01 DIAGNOSIS — N186 End stage renal disease: Secondary | ICD-10-CM | POA: Diagnosis not present

## 2021-10-01 DIAGNOSIS — Z992 Dependence on renal dialysis: Secondary | ICD-10-CM | POA: Diagnosis not present

## 2021-10-01 NOTE — Patient Instructions (Signed)
Access Code: [email or web address] URL: [email or web address] Date: 10/01/2021 Prepared by: Jamey Reas  Exercises Alternating Punch with Resistance - 1 x daily - 5-7 x weekly - 1 sets - 10 reps - 5 seconds hold Standing Scapular Protraction with Resistance - 1 x daily - 5-7 x weekly - 1 sets - 10 reps - 5 seconds hold Standing alternate rows with resistance - 1 x daily - 5-7 x weekly - 1 sets - 10 reps - 5 seconds hold Standing Row with Anchored Resistance - 1 x daily - 5-7 x weekly - 1 sets - 10 reps - 5 seconds hold Alternating elbow flexion with resistance - 1 x daily - 5-7 x weekly - 1 sets - 10 reps - 5 seconds hold Standing Bicep Curls with Resistance - 1 x daily - 5-7 x weekly - 1 sets - 10 reps - 5 seconds hold Standing Hip Flexion with Resistance (Mirrored) - 1 x daily - 5-7 x weekly - 1 sets - 10 reps Standing Hip Extension with Resistance - 1 x daily - 5-7 x weekly - 1 sets - 10 reps Standing Hip Adduction with Resistance (Mirrored) - 1 x daily - 5-7 x weekly - 1 sets - 10 reps Standing Hip Abduction with Theraband Resistance - 1 x daily - 5-7 x weekly - 1 sets - 10 reps Squat with Chair Touch - 1 x daily - 5-7 x weekly - 1 sets - 10 reps - 5 seconds hold

## 2021-10-01 NOTE — Therapy (Signed)
[facility] [address] [place], [place], [address] Phone: [phone]   Fax:  [phone]  Physical Therapy Treatment & Discharge Summary  Duane Hall  Duane Hall: Duane Hall MRN: [id number] Date of Birth: 02-03-1956 Referring Provider (PT): [name], MD   Encounter Date: 10/01/2021  PHYSICAL THERAPY DISCHARGE SUMMARY  Visits from Start of Care: 9  Current functional level related to goals / functional outcomes: See below   Remaining deficits: See below   Education / Equipment: HEP & Prosthetic Care   Duane Hall agrees to discharge. Duane Hall goals were partially met. Duane Hall is being discharged due to the Duane Hall's request.    PT End of Session - 10/01/21 0850     Visit Number 9    Number of Visits 20    Date for PT Re-Evaluation 10/04/21    Authorization Type BCBS    Authorization Time Period OOP & Ded MET    PT Start Time 0845    PT Stop Time 0929    PT Time Calculation (min) 44 min    Equipment Utilized During Treatment Gait belt    Activity Tolerance Duane Hall tolerated treatment well    Behavior During Therapy WFL for tasks assessed/performed             Past Medical History:  Diagnosis Date   Asthma    Cancer ([place])    Cataract    CKD (chronic kidney disease), stage III ([place])    Diabetes mellitus    Glaucoma    Vascular insufficiency 05/2020    Past Surgical History:  Procedure Laterality Date   ABDOMINAL AORTOGRAM W/LOWER EXTREMITY N/A 06/19/2020   Procedure: ABDOMINAL AORTOGRAM W/LOWER EXTREMITY;  Surgeon: Waynetta Sandy, MD;  Location: [place] [facility];  Service: Cardiovascular;  Laterality: N/A;   ABDOMINAL AORTOGRAM W/LOWER EXTREMITY Left 10/16/2020   Procedure: ABDOMINAL AORTOGRAM W/LOWER EXTREMITY;  Surgeon: Waynetta Sandy, MD;  Location: [place] [facility];  Service: Cardiovascular;  Laterality: Left;   ABDOMINAL AORTOGRAM W/LOWER EXTREMITY N/A 12/25/2020   Procedure:  ABDOMINAL AORTOGRAM W/LOWER EXTREMITY;  Surgeon: Waynetta Sandy, MD;  Location: [place] [facility];  Service: Cardiovascular;  Laterality: N/A;   AMPUTATION Right 01/12/2021   Procedure: 1ST AMPUTATION RAY;  Surgeon: [name], DPM;  Location: [place];  Service: Podiatry;  Laterality: Right;   AMPUTATION Right 03/13/2021   Procedure: RIGHT BELOW KNEE AMPUTATION;  Surgeon: Waynetta Sandy, MD;  Location: [place];  Service: Vascular;  Laterality: Right;   AV FISTULA PLACEMENT Right 09/14/2019   Procedure: Right Arm Basilic Vein transposition;  Surgeon: Angelia Mould, MD;  Location: [place];  Service: Vascular;  Laterality: Right;   BONE BIOPSY Left 07/29/2020   Procedure: BONE BIOPSY;  Surgeon: [name], DPM;  Location: [place];  Service: Podiatry;  Laterality: Left;  Need bone trephines and/or large bore Giamshidi   COLONOSCOPY     PERIPHERAL VASCULAR ATHERECTOMY Left 06/19/2020   Procedure: PERIPHERAL VASCULAR ATHERECTOMY;  Surgeon: Waynetta Sandy, MD;  Location: [place] [facility];  Service: Cardiovascular;  Laterality: Left;  SFA   PERIPHERAL VASCULAR ATHERECTOMY  12/25/2020   Procedure: PERIPHERAL VASCULAR ATHERECTOMY;  Surgeon: Waynetta Sandy, MD;  Location: [place] [facility];  Service: Cardiovascular;;  Lt. PT - Laser Lt. SFA - Laser   PERIPHERAL VASCULAR BALLOON ANGIOPLASTY  12/25/2020   Procedure: PERIPHERAL VASCULAR BALLOON ANGIOPLASTY;  Surgeon: Waynetta Sandy, MD;  Location: [place] [facility];  Service: Cardiovascular;;  Lt. SFA and PT   UPPER GASTROINTESTINAL ENDOSCOPY  02/2019   Dr Benson Norway      There were no vitals filed for this visit.   Subjective Assessment - 10/01/21 0848     Subjective Duane Hall burned his left hand Friday with no signs of infection.  Duane Hall is still having a couple of issues with lock not latching on prosthesis.    Duane Hall is accompained by: Family member    Pertinent History ESRD home dialysis, DM,  PVD, asthma, glaucoma    Duane Hall Stated Goals Walk with prosthesis at community level.  Get around house without w/c.    Currently in Pain? No/denies                 Prosthetics Assessment - 10/01/21 0845       Prosthetics   Prosthetic Care Independent with Skin check;Residual limb care;Care of non-amputated limb;Prosthetic cleaning;Ply sock cleaning;Correct ply sock adjustment;Proper wear schedule/adjustment;Proper weight-bearing schedule/adjustment    Donning prosthesis  Modified independent (Device/Increase time)    Doffing prosthesis  Modified independent (Device/Increase time)    Current prosthetic wear tolerance (days/week)  daily    Current prosthetic wear tolerance (#hours/day)  within 1 hour of arising, removes midday for 1-2 hrs and removes within 1 hour of bedtime.    Current prosthetic weight-bearing tolerance (hours/day)  Duane Hall tolerated standing for 5-8 min with partial weight on prosthesis with no residual limb pain    Residual limb condition  no open areas, no hair growth, normal color & temperature, cylinderical shape, distal 2-3" of limb without tibia or bone structure.                          [facility] Adult PT Treatment/Exercise - 10/01/21 0845       Transfers   Transfers Sit to Stand;Stand to Sit    Sit to Stand 6: Modified independent (Device/Increase time);With upper extremity assist;From chair/3-in-1;With armrests    Stand to Sit 6: Modified independent (Device/Increase time);With upper extremity assist;With armrests;To chair/3-in-1      Ambulation/Gait   Ambulation/Gait Yes    Ambulation/Gait Assistance 6: Modified independent (Device/Increase time);4: Min assist   minA cane & mod ind rollator   Ambulation/Gait Assistance Hall trialed cane with stand alone tip & with std tip.  pt was more stable with stand alone tip.  PT also demo & verbal cues walking around table for intermittent touch to practice at home. Pt return demo & verbalized  understanding.                     PT Education - 10/01/21 0909     Education Hall Access Code: [email or web address]    Person(s) Educated Duane Hall    Methods Explanation;Demonstration;Tactile cues;Verbal cues;Handout    Comprehension Verbalized understanding;Returned demonstration              PT Short Term Goals - 09/04/21 1559       PT SHORT TERM GOAL #1   Title Duane Hall verbalizes how to adjust ply socks with limb volume changes.    Time 4    Period Weeks    Status New    Target Date 09/20/21      PT SHORT TERM GOAL #2   Title Duane Hall tolerates prosthesis >80% of awake hrs total /day without skin issues or limb pain <2/10 after standing.    Time 3    Period Weeks    Status Revised    Target  Date 09/20/21      PT SHORT TERM GOAL #3   Title Duane Hall able to look over shoulders with weight shift & trunk rotation without UE support safely.    Time 3    Period Weeks    Status New    Target Date 09/20/21      PT SHORT TERM GOAL #4   Title Duane Hall ambulates 200' with cane & prosthesis with supervision    Time 3    Period Weeks    Status Revised    Target Date 09/20/21      PT SHORT TERM GOAL #5   Title Duane Hall negotiates ramps & curbs with cane & prosthesis with minA.    Time 3    Period Weeks    Status Revised    Target Date 09/20/21               PT Long Term Goals - 10/01/21 0946       PT LONG TERM GOAL #1   Title Duane Hall demonstrates & verbalized understanding of prosthetic care to enable safe utilization of prosthesis.    Time 10    Period Weeks    Status Achieved      PT LONG TERM GOAL #2   Title Duane Hall tolerates prosthesis wear >90% of awake hours without skin or limb pain issues.    Time 10    Period Weeks    Status Achieved      PT LONG TERM GOAL #3   Title Berg Balance >/= 36/56 to indicate lower fall risk    Baseline pt improved Berg Balance test to 25/56 on 09/18/2021 from 10/56 on 07/24/2021    Time 10    Period Weeks    Status  Not Met      PT LONG TERM GOAL #4   Title Duane Hall ambulates 300' with LRAD & prosthesis modified independent    Baseline achieved with rollator walker & prosthesis    Time 10    Period Weeks    Status Achieved      PT LONG TERM GOAL #5   Title Duane Hall negotiates ramps, curbs & stairs with LRAD & prosthesis modified independent.    Baseline achieved with rollator walker for ramps & curbs and 2 rails with stairs    Time 10    Period Weeks    Status Achieved                   Plan - 10/01/21 0850     Clinical Impression Statement Duane Hall is requesting discharge from PT as Duane Hall is satisfied with current level of mobility.  Duane Hall is independent with basic prosthetic care and reports wearing prosthesis most of awake hours without issues.  Duane Hall is mobile with rollator walker & prosthesis at basic community level.  Duane Hall is safe using cane near table for intermittent touch and has potential to be safe at household level with practice.  His Berg Balance improved to 25 /56 from 10 /56 initially. This score improved greater than minimal detectable level but still remains at high fall risk.  PT instructed in standing HEP which Duane Hall appears to understand.  If Duane Hall would like to improve his mobility & safety with additional PT, please refer him back to PT.    Personal Factors and Comorbidities Comorbidity 3+;Time since onset of injury/illness/exacerbation    Comorbidities ESRD home dialysis, DM, PVD, asthma, glaucoma    Examination-Activity Limitations Lift;Locomotion Level;Squat;Stairs;Stand;Transfers    Examination-Participation Restrictions Community Activity  Stability/Clinical Decision Making Evolving/Moderate complexity    Rehab Potential Good    PT Frequency 2x / week    PT Duration Other (comment)   10 weeks   PT Treatment/Interventions ADLs/Self Care Home Management;DME Instruction;Gait training;Stair training;Functional mobility training;Therapeutic activities;Therapeutic exercise;Balance  training;Neuromuscular re-education;Duane Hall/family education;Prosthetic Training;Manual techniques;Passive range of motion;Vestibular    PT Next Visit Plan discharge per pt request.    Consulted and Agree with Plan of Care Duane Hall    Family Member Consulted --             Duane Hall will benefit from skilled therapeutic intervention in order to improve the following deficits and impairments:  Abnormal gait, Decreased activity tolerance, Decreased balance, Decreased endurance, Decreased knowledge of use of DME, Decreased mobility, Decreased range of motion, Decreased strength, Increased edema, Impaired flexibility, Postural dysfunction, Prosthetic Dependency, Pain  Visit Diagnosis: Unsteadiness on feet  Other abnormalities of gait and mobility  Muscle weakness (generalized)  Pain in right lower leg  Foot drop, left  Stiffness of left ankle, not elsewhere classified  Abnormal posture     Problem List Duane Hall Active Problem List   Diagnosis Date Noted   Degeneration of lumbar intervertebral disc 05/18/2021   Drug induced constipation    Thrombocytopenia (HCC)    Labile blood pressure    Post-operative pain    S/P BKA (below knee amputation) unilateral, right (HCC)    Right BKA infection (HCC)    Gangrene of right foot ([place]) 03/16/2021   Acquired absence of right leg below knee ([place]) 03/14/2021   Peripheral arterial disease ([place])    Gangrene ([place])    Osteomyelitis of right foot ([place])    Sepsis ([place]) 01/11/2021   Cellulitis of right foot 01/11/2021   Lumbar spondylosis 12/26/2020   Lumbar radiculopathy 10/05/2020   Left atrial dilation 08/12/2020   Lumbar back pain 08/12/2020   Acute osteomyelitis of left calcaneus ([place])    Bilateral pseudophakia 07/28/2020   Stable proliferative diabetic retinopathy of both eyes associated with type 2 diabetes mellitus ([place]) 07/28/2020   Diabetic foot ulcer ([place]) 07/27/2020   ESRD on hemodialysis ([place]) 07/27/2020   Gastroparesis  diabeticorum ([place]) 07/02/2020   Vascular insufficiency of extremity 06/18/2020   Awaiting transplantation of kidney 06/18/2020   Foot ulcer, left ([place]) 06/18/2020   Dependence on renal dialysis ([place]) 05/01/2020   Cramp and spasm 05/01/2020   Hemodialysis-associated hypotension 05/01/2020   Aortic atherosclerosis ([place]) 02/28/2020   Hypoglycemia, unspecified 12/20/2019   Iron deficiency anemia, unspecified 11/17/2019   Allergy, unspecified, initial encounter 10/18/2019   Type 2 diabetes mellitus with diabetic peripheral angiopathy without gangrene ([place]) 10/16/2019   Other disorders of phosphorus metabolism 10/14/2019   Secondary hyperparathyroidism of renal origin ([place]) 18/29/9371   Complication of vascular dialysis catheter 10/09/2019   Hypercalcemia 10/07/2019   Hyperlipidemia, unspecified 10/07/2019   Tobacco use 10/07/2019   Unspecified glaucoma 10/07/2019   Encounter for immunization 10/06/2019   Coagulation defect, unspecified ([place]) 10/05/2019   ESRD (end stage renal disease) ([place]) 10/05/2019   Hypokalemia 09/09/2019   Other emphysema ([place]) 11/23/2018   Benign prostatic hyperplasia with weak urinary stream 11/18/2018   Pure hypercholesterolemia 11/18/2018   Uncontrolled type 2 diabetes mellitus with hyperglycemia ([place]) 11/18/2018   Diabetic retinopathy ([place]) 08/25/2015   Claudication ([place]) 04/07/2015   Anemia of chronic disease 09/12/2014   Hepatitis C 10/15/2013   Pain in joint, shoulder region 02/10/2013   Diabetes mellitus type 2 in nonobese ([place]) 02/24/2012   Hypertension 02/24/2012   Asthma 02/24/2012  Jamey Reas, PT, DPT 10/01/2021, 9:54 AM  [facility] [address] [place], [place], [address] Phone: [phone]   Fax:  [phone]  Duane Hall: SYLUS STGERMAIN MRN: [id number] Date of Birth: [birth year]

## 2021-10-02 ENCOUNTER — Telehealth: Payer: Self-pay | Admitting: Nutrition

## 2021-10-02 ENCOUNTER — Other Ambulatory Visit: Payer: Self-pay | Admitting: Endocrinology

## 2021-10-02 MED ORDER — DEXCOM G6 SENSOR MISC: 1.0000 | 3 refills | Status: DC

## 2021-10-02 NOTE — Telephone Encounter (Signed)
Patient says his OmniPod 5 is in, but no dexcoms were ordered.  He wants to start this on Sunday.

## 2021-10-04 ENCOUNTER — Telehealth: Payer: Self-pay | Admitting: Pharmacy Technician

## 2021-10-04 ENCOUNTER — Other Ambulatory Visit (HOSPITAL_COMMUNITY): Payer: Self-pay

## 2021-10-04 DIAGNOSIS — N2581 Secondary hyperparathyroidism of renal origin: Secondary | ICD-10-CM | POA: Diagnosis not present

## 2021-10-04 DIAGNOSIS — N186 End stage renal disease: Secondary | ICD-10-CM | POA: Diagnosis not present

## 2021-10-04 DIAGNOSIS — Z992 Dependence on renal dialysis: Secondary | ICD-10-CM | POA: Diagnosis not present

## 2021-10-04 NOTE — Telephone Encounter (Signed)
Patient Advocate Encounter   Received notification from [place] that prior authorization for [place] is required.   PA submitted on 10/04/21 Key  [id number] Status is pending    [facility] will continue to follow   Burney Gauze, CPhT Patient [facility] Phone: [phone] Fax:  [phone]

## 2021-10-04 NOTE — Telephone Encounter (Signed)
[facility] Patient Advocate Encounter  Prior Authorization for Dexcom has been approved.    PA# PA Case ID: [id number] No effective date given at this time.  Pharmacy contacted. Pt has filled recently so it's too soon to fill now.   Armanda Magic, CPhT Patient [facility] Phone: [phone] Fax:  [phone]

## 2021-10-07 ENCOUNTER — Other Ambulatory Visit: Payer: Self-pay | Admitting: Gastroenterology

## 2021-10-07 ENCOUNTER — Ambulatory Visit
Admission: RE | Admit: 2021-10-07 | Discharge: 2021-10-07 | Disposition: A | Discharge status: Home / Self Care | Payer: BC Managed Care – PPO | Source: Ambulatory Visit | Attending: Gastroenterology | Admitting: Gastroenterology

## 2021-10-07 DIAGNOSIS — R7989 Other specified abnormal findings of blood chemistry: Secondary | ICD-10-CM

## 2021-10-07 DIAGNOSIS — Z992 Dependence on renal dialysis: Secondary | ICD-10-CM | POA: Diagnosis not present

## 2021-10-07 DIAGNOSIS — N186 End stage renal disease: Secondary | ICD-10-CM | POA: Diagnosis not present

## 2021-10-07 DIAGNOSIS — N2581 Secondary hyperparathyroidism of renal origin: Secondary | ICD-10-CM | POA: Diagnosis not present

## 2021-10-07 IMAGING — MR MR MRCP
9 of 12 series · 30 of 48 positions shown · non-contrast
Comparison: Multiple prior studies, non contrasted CTs and
abdominal sonograms dating back to [6K].

CLINICAL DATA: A 65-year-old male presents for evaluation of
elevated liver function.

EXAM:
MRI ABDOMEN WITHOUT CONTRAST  (INCLUDING MRCP)
TECHNIQUE: Multiplanar multisequence MR imaging of the abdomen was performed.
Heavily T2-weighted images of the biliary and pancreatic ducts were
obtained, and three-dimensional MRCP images were rendered by post
processing.

[Series 3: cor haste · coronal · 5.0mm · 0.74mm/px · 2 of 36 slices shown]
[im 1/36]
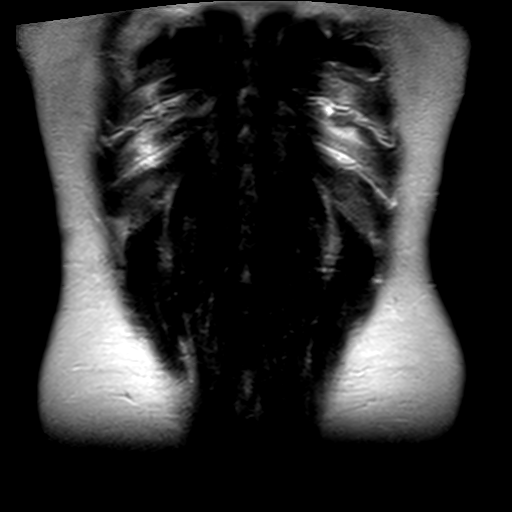
[im 36/36]
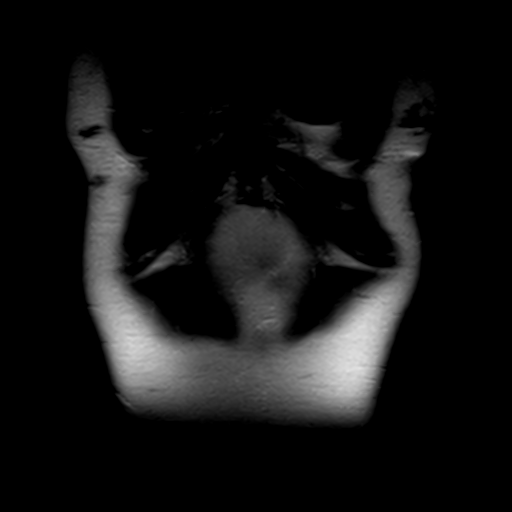

[Series 4: axial haste · axial · 6.0mm · 0.78mm/px · z∈[-109,+89]mm · 3 of 31 slices shown]
[im 1/31]
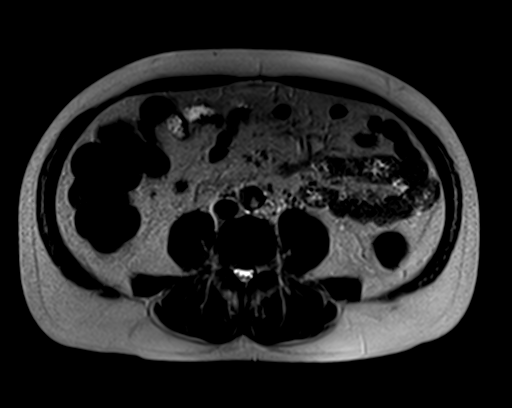
[im 16/31]
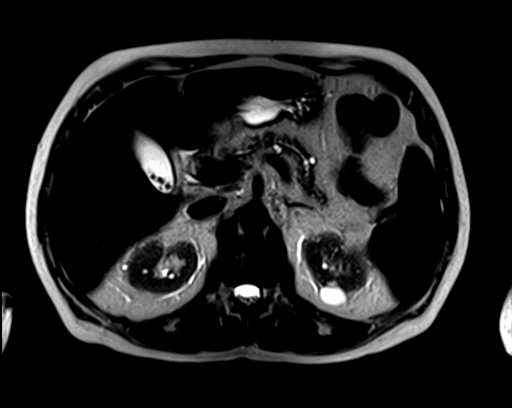
[im 31/31]
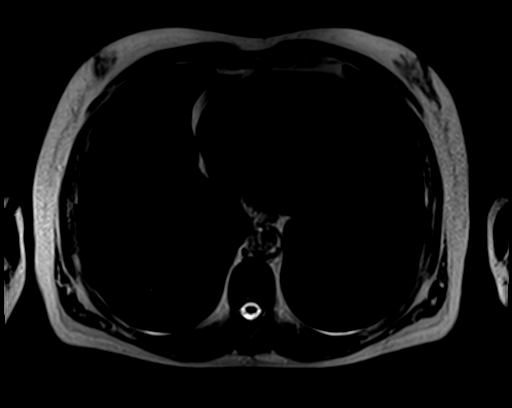

[Series 9: MRCP · oblique · 0.99mm/px · 1 of 3 slices shown]
[im 1/3]
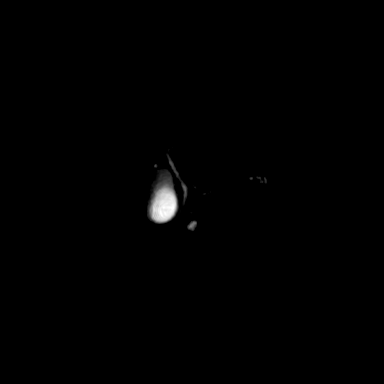

[Series 11: T2 · axial · 6.0mm · 1.12mm/px · z∈[-92,+117]mm · 2 of 30 slices shown (1 of 2)]
[im 1/30]
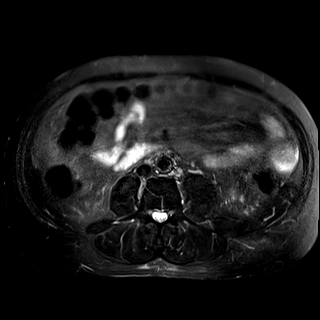
[im 30/30]
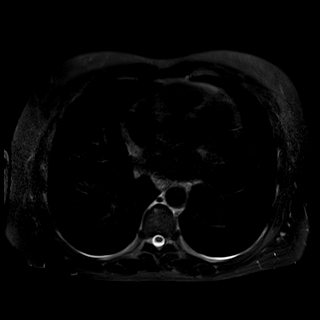

[Series 12: ep2d_diff_b50_500_800_p2_trig · axial · 6.0mm · 1.82mm/px · z∈[-99,+124]mm · 8 of 96 slices shown]
[im 1/96]
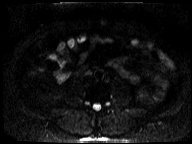
[im 14/96]
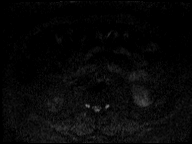
[im 28/96]
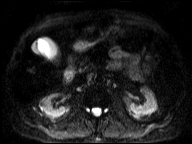
[im 41/96]
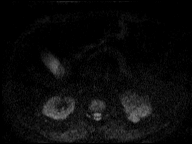
[im 55/96]
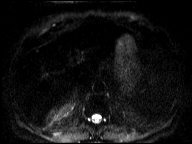
[im 68/96]
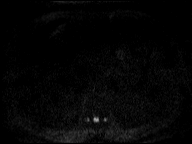
[im 82/96]
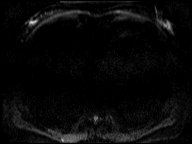
[im 96/96]
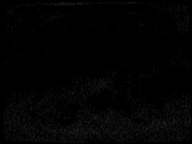

[Series 13: ep2d_diff_b50_500_800_p2_trig_adc · axial · 6.0mm · 1.82mm/px · z∈[-99,+124]mm · 3 of 32 slices shown]
[im 1/32]
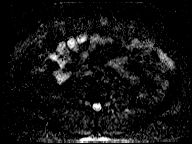
[im 16/32]
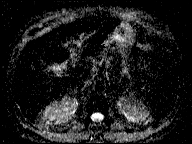
[im 32/32]
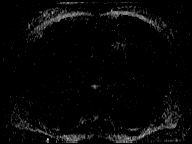

[Series 14: bSSFP · coronal · 5.0mm · 0.78mm/px · 3 of 34 slices shown]
[im 1/34]
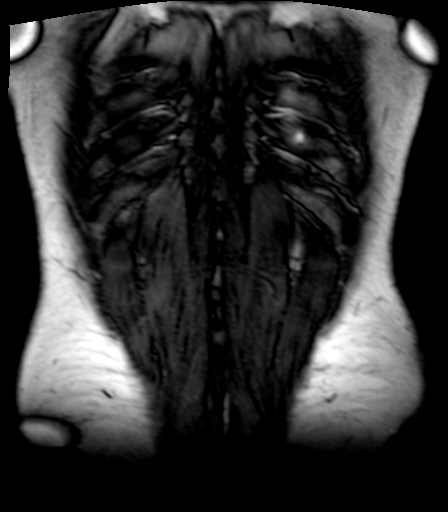
[im 17/34]
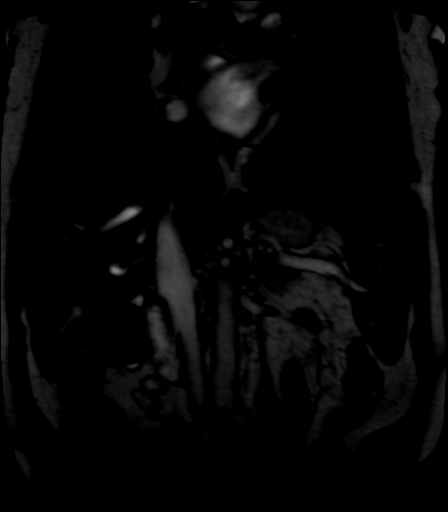
[im 34/34]
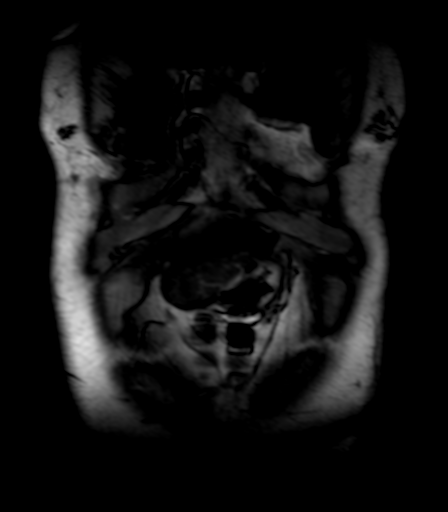

[Series 15: T2 · coronal · 3.0mm · 0.70mm/px · 5 of 58 slices shown (2 of 2)]
[im 1/58]
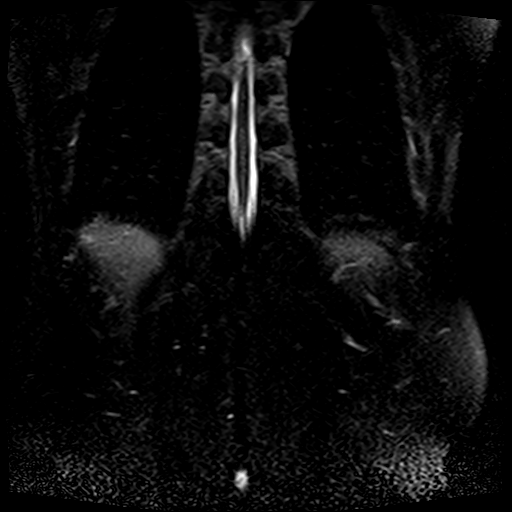
[im 15/58]
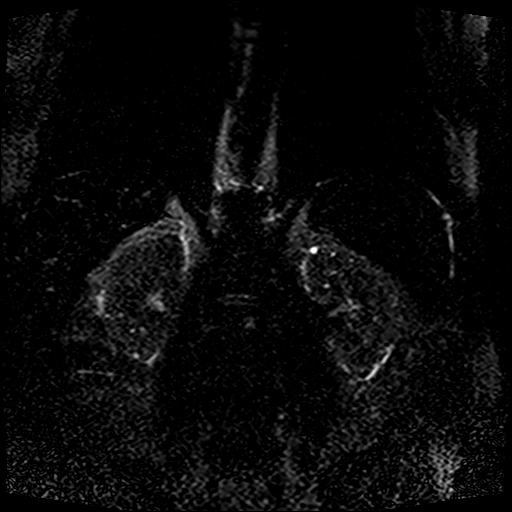
[im 29/58]
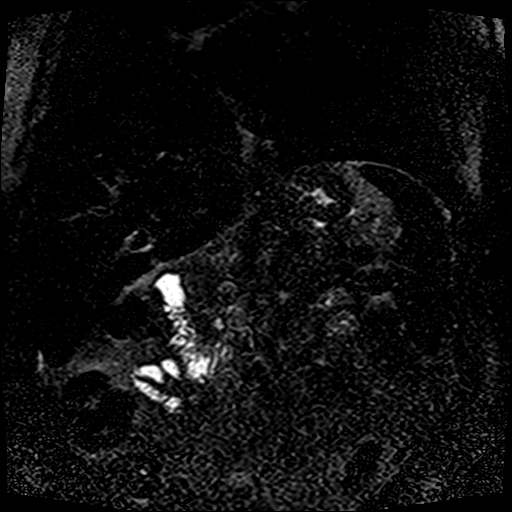
[im 43/58]
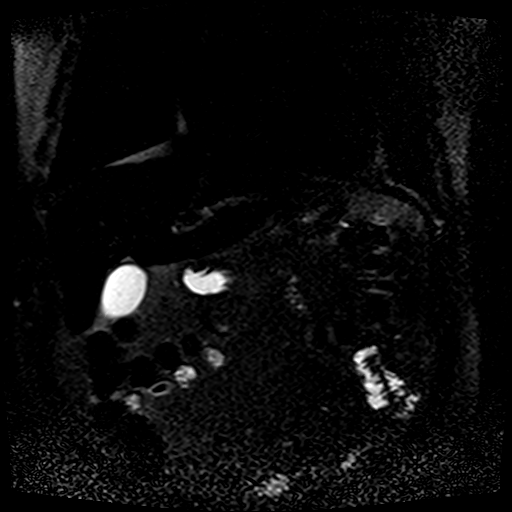
[im 58/58]
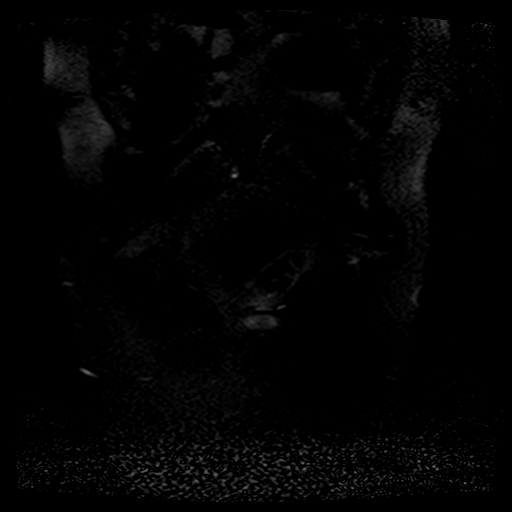

[Series 16: T1 · axial · 6.0mm · 0.74mm/px · z∈[-105,-13]mm · 3 of 60 slices shown]
[im 1/60]
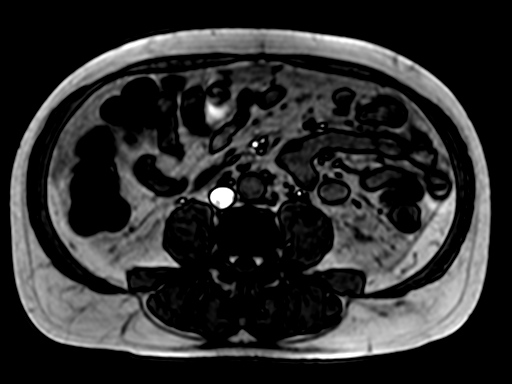
[im 15/60]
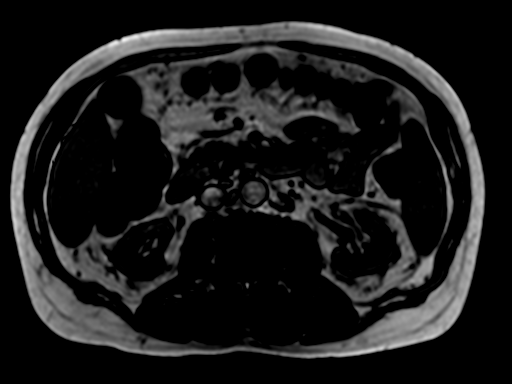
[im 30/60]
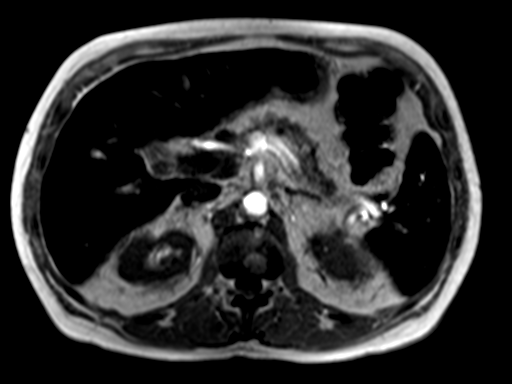

[30 of 48 positions shown; findings below may reference images not displayed]

FINDINGS: Lower chest: Lung bases are unremarkable on limited assessment on
abdominal MRI. No effusion or consolidative process.

Hepatobiliary:

Nodular hepatic contours with signs of caudate enlargement. T2
bright area in the RIGHT hepatic lobe 1.6 x 1.7 cm in the axial
plane (image [DATE]) stable size dating back to [6K] corresponding to
echogenic lesion seen on ultrasound in the RIGHT hemiliver,
compatible with small benign lesion, likely hemangioma.

LEFT hepatic lobe lateral segment (image [DATE]) 2.2 x 1.7 cm area of
increased T2 signal with well-circumscribed margins measured 2.3 x
1.9 cm on [DATE], likely cyst or hemangioma.

No biliary duct dilation. Cholelithiasis. No biliary duct dilation
or pericholecystic stranding. No filling defect in the common bile
duct. Small cyst in the LEFT hepatic lobe lateral segment (image
[DATE].)

Signs of hepatic iron deposition. This is profound with 60% signal
loss in the liver on in phase as compared to opposed phase gradient
echo T1 weighted imaging.

Pancreas:

Pancreatic atrophy. 10 x 6 mm T2 hyperintense lesion in the
pancreatic head (image [DATE]) probable ductal communication.

Two additional small cystic lesions measuring approximately 6 mm
(image [DATE]) 1 in the body in the other in the tail of the pancreas.
No main duct pancreatic ductal dilation. Diminished intrinsic T1
signal throughout the pancreas.

Spleen: Top-normal size 12 cm greatest craniocaudal dimension. Signs
of profound splenic iron deposition as well.

Adrenals/Urinary Tract: Adrenal glands are normal. Small renal cysts
bilaterally largest in the posterior LEFT kidney measuring 21 x 19
mm. Homogeneous intrinsically T1 hyperintense area in the inferior
aspect LEFT kidney is also likely a cyst with hemorrhagic features
based on appearance. This is less than a cm. Areas not well assessed
without contrast.

Stomach/Bowel: Unremarkable to the extent evaluated on abdominal
MRI.

Vascular/Lymphatic: Smooth contour of the aorta and IVC, no
aneurysmal dilation of the abdominal aorta. Not well assessed
without contrast. No adenopathy in the abdomen.

Other:  No ascites.

Musculoskeletal: No suspicious bone lesions identified. There is
however marked iron deposition in marrow spaces based on inphase
signal of marrow spaces on the current study.
IMPRESSION: Signs of hepatic iron deposition in the liver, spleen, and marrow
spaces. Correlate with any exogenous iron administration or for any
other risk factors of iron overload.

Nodular hepatic contours compatible with an element of liver disease
with mild splenic enlargement. Etiology uncertain with signs of
hepatic splenic and marrow iron deposition as above.

Cholelithiasis. No evidence of biliary duct dilation.

10 x 6 mm cystic lesion in the pancreatic head probable ductal
communication. Follow-up MRI/MRCP in 12 months is suggested.

Presumed hemangioma in the RIGHT hepatic lobe with stability since
[6K], development of a cystic area in the LEFT hepatic lobe since
that time which is stable since [6K], likely a hepatic cyst or
hemangioma.

## 2021-10-08 ENCOUNTER — Encounter: Payer: Self-pay | Admitting: Endocrinology

## 2021-10-08 ENCOUNTER — Telehealth: Payer: Self-pay

## 2021-10-08 ENCOUNTER — Other Ambulatory Visit: Payer: Self-pay

## 2021-10-08 DIAGNOSIS — N186 End stage renal disease: Secondary | ICD-10-CM | POA: Diagnosis not present

## 2021-10-08 DIAGNOSIS — N2581 Secondary hyperparathyroidism of renal origin: Secondary | ICD-10-CM | POA: Diagnosis not present

## 2021-10-08 DIAGNOSIS — Z992 Dependence on renal dialysis: Secondary | ICD-10-CM | POA: Diagnosis not present

## 2021-10-08 DIAGNOSIS — K7460 Unspecified cirrhosis of liver: Secondary | ICD-10-CM | POA: Diagnosis not present

## 2021-10-08 DIAGNOSIS — R7401 Elevation of levels of liver transaminase levels: Secondary | ICD-10-CM | POA: Diagnosis not present

## 2021-10-08 DIAGNOSIS — I739 Peripheral vascular disease, unspecified: Secondary | ICD-10-CM

## 2021-10-08 NOTE — Telephone Encounter (Signed)
Patient's wife calls today to report that patient's left foot has tiny little spots and streaking. It's cold and toes are red, white, purple and brown. Denies any ulcers, but says there are two scabbed over areas on the second toe. No swelling. Says toes "look like ground sausage." Patient is s/p amputation of the right leg. Added patient on to schedule this week for ABI and evaluation.

## 2021-10-09 ENCOUNTER — Other Ambulatory Visit (HOSPITAL_COMMUNITY): Payer: Self-pay

## 2021-10-09 ENCOUNTER — Other Ambulatory Visit: Payer: Self-pay

## 2021-10-09 DIAGNOSIS — E1122 Type 2 diabetes mellitus with diabetic chronic kidney disease: Secondary | ICD-10-CM

## 2021-10-09 DIAGNOSIS — N186 End stage renal disease: Secondary | ICD-10-CM

## 2021-10-09 DIAGNOSIS — Z992 Dependence on renal dialysis: Secondary | ICD-10-CM

## 2021-10-09 MED ORDER — DEXCOM G6 RECEIVER DEVI: 0 refills | Status: DC

## 2021-10-09 MED ORDER — DEXCOM G6 TRANSMITTER MISC: 3 refills | Status: DC

## 2021-10-10 ENCOUNTER — Encounter: Payer: Self-pay | Admitting: Vascular Surgery

## 2021-10-10 ENCOUNTER — Other Ambulatory Visit: Payer: Self-pay

## 2021-10-10 ENCOUNTER — Ambulatory Visit (INDEPENDENT_AMBULATORY_CARE_PROVIDER_SITE_OTHER): Payer: BC Managed Care – PPO | Admitting: Vascular Surgery

## 2021-10-10 ENCOUNTER — Ambulatory Visit (HOSPITAL_COMMUNITY)
Admission: RE | Admit: 2021-10-10 | Discharge: 2021-10-10 | Disposition: A | Discharge status: Home / Self Care | Payer: BC Managed Care – PPO | Source: Ambulatory Visit | Attending: Vascular Surgery | Admitting: Vascular Surgery

## 2021-10-10 VITALS — BP 123/68 | HR 82 | Temp 98.4°F | Resp 20 | Ht 69.0 in | Wt 168.0 lb

## 2021-10-10 DIAGNOSIS — I739 Peripheral vascular disease, unspecified: Secondary | ICD-10-CM | POA: Insufficient documentation

## 2021-10-10 DIAGNOSIS — N2581 Secondary hyperparathyroidism of renal origin: Secondary | ICD-10-CM | POA: Diagnosis not present

## 2021-10-10 DIAGNOSIS — N186 End stage renal disease: Secondary | ICD-10-CM

## 2021-10-10 DIAGNOSIS — Z992 Dependence on renal dialysis: Secondary | ICD-10-CM | POA: Diagnosis not present

## 2021-10-10 NOTE — Progress Notes (Signed)
Patient ID: Duane Hall, male   DOB: [birth year], 65 y.o.   MRN: [id number]  Reason for Consult: Follow-up   Referred by Pieter Partridge, PA  Subjective:     HPI:  Duane Hall is a 65 y.o. male history of end-stage renal disease on dialysis via right arm AV fistula.  He dialyzes at home 4 days a week.  Previous right below-knee amputation now walking with help of prosthetic.  1 year ago we evaluated his left lower extremity for wounds.  These have subsequently healed.  He does not have any pain or wounds in his left lower extremity at this time.  He has not had any intervention of his left lower extremity in the past.  He denies any claudication.  He continues aspirin, Plavix and Crestor.  Past Medical History:  Diagnosis Date   Asthma    Cancer ([employer])    Cataract    CKD (chronic kidney disease), stage III ([place])    Diabetes mellitus    Glaucoma    Vascular insufficiency 05/2020   Family History  Problem Relation Age of Onset   Cancer Father    Diabetes Mother    Past Surgical History:  Procedure Laterality Date   ABDOMINAL AORTOGRAM W/LOWER EXTREMITY N/A 06/19/2020   Procedure: ABDOMINAL AORTOGRAM W/LOWER EXTREMITY;  Surgeon: Waynetta Sandy, MD;  Location: [place] [facility];  Service: Cardiovascular;  Laterality: N/A;   ABDOMINAL AORTOGRAM W/LOWER EXTREMITY Left 10/16/2020   Procedure: ABDOMINAL AORTOGRAM W/LOWER EXTREMITY;  Surgeon: Waynetta Sandy, MD;  Location: [place] [facility];  Service: Cardiovascular;  Laterality: Left;   ABDOMINAL AORTOGRAM W/LOWER EXTREMITY N/A 12/25/2020   Procedure: ABDOMINAL AORTOGRAM W/LOWER EXTREMITY;  Surgeon: Waynetta Sandy, MD;  Location: [place] [facility];  Service: Cardiovascular;  Laterality: N/A;   AMPUTATION Right 01/12/2021   Procedure: 1ST AMPUTATION RAY;  Surgeon: [name], DPM;  Location: [place];  Service: Podiatry;  Laterality: Right;   AMPUTATION Right 03/13/2021   Procedure: RIGHT BELOW  KNEE AMPUTATION;  Surgeon: Waynetta Sandy, MD;  Location: [place];  Service: Vascular;  Laterality: Right;   AV FISTULA PLACEMENT Right 09/14/2019   Procedure: Right Arm Basilic Vein transposition;  Surgeon: Angelia Mould, MD;  Location: [place];  Service: Vascular;  Laterality: Right;   BONE BIOPSY Left 07/29/2020   Procedure: BONE BIOPSY;  Surgeon: [name], DPM;  Location: [place];  Service: Podiatry;  Laterality: Left;  Need bone trephines and/or large bore Giamshidi   COLONOSCOPY     PERIPHERAL VASCULAR ATHERECTOMY Left 06/19/2020   Procedure: PERIPHERAL VASCULAR ATHERECTOMY;  Surgeon: Waynetta Sandy, MD;  Location: [place] [facility];  Service: Cardiovascular;  Laterality: Left;  SFA   PERIPHERAL VASCULAR ATHERECTOMY  12/25/2020   Procedure: PERIPHERAL VASCULAR ATHERECTOMY;  Surgeon: Waynetta Sandy, MD;  Location: [place] [facility];  Service: Cardiovascular;;  Lt. PT - Laser Lt. SFA - Laser   PERIPHERAL VASCULAR BALLOON ANGIOPLASTY  12/25/2020   Procedure: PERIPHERAL VASCULAR BALLOON ANGIOPLASTY;  Surgeon: Waynetta Sandy, MD;  Location: [place] [facility];  Service: Cardiovascular;;  Lt. SFA and PT   UPPER GASTROINTESTINAL ENDOSCOPY  02/2019   Dr Ermelinda Das Social History:  Social History   Tobacco Use   Smoking status: Every Day    Packs/day: 0.30    Types: Cigarettes   Smokeless tobacco: Never  Substance Use Topics   Alcohol use: No  Alcohol/week: 0.0 standard drinks    Allergies  Allergen Reactions   Amoxicillin-Pot Clavulanate Diarrhea and Other (See Comments)   Midodrine Other (See Comments)    Other reaction(s): Urinary Sensation   Tape Other (See Comments)    bandaids cause blistering    Current Outpatient Medications  Medication Sig Dispense Refill   aspirin EC 81 MG EC tablet Take 1 tablet (81 mg total) by mouth daily. Swallow whole. (Patient taking differently: Take 81 mg by mouth every morning.  Swallow whole.) 30 tablet 11   clopidogrel (PLAVIX) 75 MG tablet Take 1 tablet (75 mg total) by mouth daily with breakfast. (Patient taking differently: Take 75 mg by mouth every morning.) 30 tablet 3   Continuous Blood Gluc Receiver (DEXCOM G6 RECEIVER) DEVI Use as Directed 1 each 0   Continuous Blood Gluc Sensor (DEXCOM G6 SENSOR) MISC 1 Device by Does not apply route See admin instructions. Change every 10 days 9 each 3   Continuous Blood Gluc Transmit (DEXCOM G6 TRANSMITTER) MISC Change every 3 mos 1 each 3   docusate sodium (COLACE) 100 MG capsule Take 100 mg by mouth every morning.     ferric citrate (AURYXIA) 1 GM 210 MG(Fe) tablet Take 210 mg by mouth 2 (two) times daily with a meal.     Glucagon (GVOKE HYPOPEN 1-PACK) 1 MG/0.2ML SOAJ Inject 1 mg into the skin once as needed (low blood sugar).     Insulin Aspart, w/Niacinamide, (FIASP) 100 UNIT/ML SOLN For use in pump, total of 30 units per day 90 mL 0   Insulin Disposable Pump (OMNIPOD 5 G6 POD, GEN 5,) MISC 1 Device by Does not apply route every 3 (three) days. 30 each 3   iron sucrose in sodium chloride 0.9 % 100 mL Iron Sucrose (Venofer) Self Administer at Home     Methoxy PEG-Epoetin Beta (MIRCERA IJ) Inject into the skin.     multivitamin (RENA-VIT) TABS tablet Take 1 tablet by mouth every morning.     pantoprazole (PROTONIX) 40 MG tablet Take 1 tablet (40 mg total) by mouth daily.     rosuvastatin (CRESTOR) 10 MG tablet TAKE 1 TABLET BY MOUTH DAILY (Patient taking differently: Take 10 mg by mouth every morning.) 30 tablet 11   No current facility-administered medications for this visit.    Review of Systems  Constitutional:  Constitutional negative. Eyes: Eyes negative.  Respiratory: Respiratory negative.  Cardiovascular: Cardiovascular negative.  GI: Gastrointestinal negative.  Musculoskeletal: Musculoskeletal negative.  Skin: Skin negative.  Neurological: Neurological negative. Hematologic: Hematologic/lymphatic negative.   Psychiatric: Psychiatric negative.       Objective:  Objective   Vitals:   10/10/21 0828  BP: 123/68  Pulse: 82  Resp: 20  Temp: 98.4 F (36.9 C)  SpO2: 98%  Weight: 168 lb (76.2 kg)  Height: 5\' 9"  (1.753 m)   Body mass index is 24.81 kg/m.  Physical Exam HENT:     Head: Normocephalic.     Nose: Nose normal.  Eyes:     Pupils: Pupils are equal, round, and reactive to light.  Cardiovascular:     Rate and Rhythm: Normal rate.     Pulses:          Popliteal pulses are 2+ on the left side.  Pulmonary:     Effort: Pulmonary effort is normal.  Abdominal:     General: Abdomen is flat.     Palpations: Abdomen is soft.  Musculoskeletal:     Comments: Right upper extremity strong  thrill Right below-knee amputation with prosthetic in place  Skin:    General: Skin is warm and dry.     Capillary Refill: Capillary refill takes less than 2 seconds.     Comments: No left lower extremity wounds  Neurological:     General: No focal deficit present.     Mental Status: He is alert.  Psychiatric:        Mood and Affect: Mood normal.        Behavior: Behavior normal.        Thought Content: Thought content normal.    Data: ABI Findings:  +--------+------------------+-----+--------+--------+  Right   Rt Pressure (mmHg)IndexWaveformComment   +--------+------------------+-----+--------+--------+  Brachial                               BVT       +--------+------------------+-----+--------+--------+  PTA                                    BKA       +--------+------------------+-----+--------+--------+  DP                                     BKA       +--------+------------------+-----+--------+--------+   +---------+------------------+-----+----------+-------+  Left     Lt Pressure (mmHg)IndexWaveform  Comment  +---------+------------------+-----+----------+-------+  Brachial 141                                        +---------+------------------+-----+----------+-------+  PTA      147               1.04 biphasic           +---------+------------------+-----+----------+-------+  DP       140               0.99 monophasic         +---------+------------------+-----+----------+-------+  Great Toe84                0.60                    +---------+------------------+-----+----------+-------+   +-------+-----------+-----------+------------+------------+  ABI/TBIToday's ABIToday's TBIPrevious ABIPrevious TBI  +-------+-----------+-----------+------------+------------+  Right  BKA        BKA        0.82        amputation    +-------+-----------+-----------+------------+------------+  Left   1.04       0.60       0.95        0.60          +-------+-----------+-----------+------------+------------+          Assessment/Plan:     65 year old male status post right below-knee amputation here for left lower extremity evaluation.  No wounds or pain at this time.  I think he actually has a palpable posterior tibial pulse but this is not readily palpable.  He does not have any wounds he does have brisk capillary refill on the left.  He did get evaluated with angiography of the left lower extremity does have some tibial disease particularly at the tibioperoneal trunk was not intervened upon.  We will plan to have him follow-up in 1 year with  repeat ABIs.     Waynetta Sandy MD [facility]

## 2021-10-11 DIAGNOSIS — N186 End stage renal disease: Secondary | ICD-10-CM | POA: Diagnosis not present

## 2021-10-11 DIAGNOSIS — N2581 Secondary hyperparathyroidism of renal origin: Secondary | ICD-10-CM | POA: Diagnosis not present

## 2021-10-11 DIAGNOSIS — Z992 Dependence on renal dialysis: Secondary | ICD-10-CM | POA: Diagnosis not present

## 2021-10-12 ENCOUNTER — Other Ambulatory Visit: Payer: Self-pay | Admitting: Gastroenterology

## 2021-10-12 ENCOUNTER — Other Ambulatory Visit: Payer: Self-pay

## 2021-10-12 MED ORDER — OMNIPOD 5 DEXG7G6 PODS GEN 5 MISC: 1.0000 | 3 refills | Status: DC

## 2021-10-12 NOTE — Telephone Encounter (Signed)
Script sent  

## 2021-10-14 DIAGNOSIS — Z992 Dependence on renal dialysis: Secondary | ICD-10-CM | POA: Diagnosis not present

## 2021-10-14 DIAGNOSIS — N2581 Secondary hyperparathyroidism of renal origin: Secondary | ICD-10-CM | POA: Diagnosis not present

## 2021-10-14 DIAGNOSIS — N186 End stage renal disease: Secondary | ICD-10-CM | POA: Diagnosis not present

## 2021-10-15 ENCOUNTER — Encounter: Payer: Self-pay | Admitting: Endocrinology

## 2021-10-15 ENCOUNTER — Other Ambulatory Visit: Payer: Self-pay

## 2021-10-15 DIAGNOSIS — N186 End stage renal disease: Secondary | ICD-10-CM | POA: Diagnosis not present

## 2021-10-15 DIAGNOSIS — E1122 Type 2 diabetes mellitus with diabetic chronic kidney disease: Secondary | ICD-10-CM

## 2021-10-15 DIAGNOSIS — Z992 Dependence on renal dialysis: Secondary | ICD-10-CM | POA: Diagnosis not present

## 2021-10-15 DIAGNOSIS — N2581 Secondary hyperparathyroidism of renal origin: Secondary | ICD-10-CM | POA: Diagnosis not present

## 2021-10-15 MED ORDER — DEXCOM G6 SENSOR MISC: 1.0000 | 3 refills | Status: DC

## 2021-10-15 NOTE — Telephone Encounter (Signed)
Rx sent 

## 2021-10-16 ENCOUNTER — Other Ambulatory Visit: Payer: Self-pay | Admitting: Endocrinology

## 2021-10-17 DIAGNOSIS — N2581 Secondary hyperparathyroidism of renal origin: Secondary | ICD-10-CM | POA: Diagnosis not present

## 2021-10-17 DIAGNOSIS — Z992 Dependence on renal dialysis: Secondary | ICD-10-CM | POA: Diagnosis not present

## 2021-10-17 DIAGNOSIS — N186 End stage renal disease: Secondary | ICD-10-CM | POA: Diagnosis not present

## 2021-10-18 DIAGNOSIS — N186 End stage renal disease: Secondary | ICD-10-CM | POA: Diagnosis not present

## 2021-10-18 DIAGNOSIS — N2581 Secondary hyperparathyroidism of renal origin: Secondary | ICD-10-CM | POA: Diagnosis not present

## 2021-10-18 DIAGNOSIS — Z992 Dependence on renal dialysis: Secondary | ICD-10-CM | POA: Diagnosis not present

## 2021-10-21 DIAGNOSIS — Z992 Dependence on renal dialysis: Secondary | ICD-10-CM | POA: Diagnosis not present

## 2021-10-21 DIAGNOSIS — N2581 Secondary hyperparathyroidism of renal origin: Secondary | ICD-10-CM | POA: Diagnosis not present

## 2021-10-21 DIAGNOSIS — N186 End stage renal disease: Secondary | ICD-10-CM | POA: Diagnosis not present

## 2021-10-22 DIAGNOSIS — N186 End stage renal disease: Secondary | ICD-10-CM | POA: Diagnosis not present

## 2021-10-22 DIAGNOSIS — N2581 Secondary hyperparathyroidism of renal origin: Secondary | ICD-10-CM | POA: Diagnosis not present

## 2021-10-22 DIAGNOSIS — Z992 Dependence on renal dialysis: Secondary | ICD-10-CM | POA: Diagnosis not present

## 2021-10-24 DIAGNOSIS — Z992 Dependence on renal dialysis: Secondary | ICD-10-CM | POA: Diagnosis not present

## 2021-10-24 DIAGNOSIS — N2581 Secondary hyperparathyroidism of renal origin: Secondary | ICD-10-CM | POA: Diagnosis not present

## 2021-10-24 DIAGNOSIS — N186 End stage renal disease: Secondary | ICD-10-CM | POA: Diagnosis not present

## 2021-10-25 DIAGNOSIS — N2581 Secondary hyperparathyroidism of renal origin: Secondary | ICD-10-CM | POA: Diagnosis not present

## 2021-10-25 DIAGNOSIS — Z992 Dependence on renal dialysis: Secondary | ICD-10-CM | POA: Diagnosis not present

## 2021-10-25 DIAGNOSIS — N186 End stage renal disease: Secondary | ICD-10-CM | POA: Diagnosis not present

## 2021-10-26 DIAGNOSIS — Z89511 Acquired absence of right leg below knee: Secondary | ICD-10-CM | POA: Diagnosis not present

## 2021-10-28 DIAGNOSIS — N186 End stage renal disease: Secondary | ICD-10-CM | POA: Diagnosis not present

## 2021-10-28 DIAGNOSIS — Z992 Dependence on renal dialysis: Secondary | ICD-10-CM | POA: Diagnosis not present

## 2021-10-28 DIAGNOSIS — N2581 Secondary hyperparathyroidism of renal origin: Secondary | ICD-10-CM | POA: Diagnosis not present

## 2021-10-29 DIAGNOSIS — E1129 Type 2 diabetes mellitus with other diabetic kidney complication: Secondary | ICD-10-CM | POA: Diagnosis not present

## 2021-10-29 DIAGNOSIS — N2581 Secondary hyperparathyroidism of renal origin: Secondary | ICD-10-CM | POA: Diagnosis not present

## 2021-10-29 DIAGNOSIS — N186 End stage renal disease: Secondary | ICD-10-CM | POA: Diagnosis not present

## 2021-10-29 DIAGNOSIS — Z992 Dependence on renal dialysis: Secondary | ICD-10-CM | POA: Diagnosis not present

## 2021-10-31 DIAGNOSIS — N2581 Secondary hyperparathyroidism of renal origin: Secondary | ICD-10-CM | POA: Diagnosis not present

## 2021-10-31 DIAGNOSIS — N186 End stage renal disease: Secondary | ICD-10-CM | POA: Diagnosis not present

## 2021-10-31 DIAGNOSIS — Z992 Dependence on renal dialysis: Secondary | ICD-10-CM | POA: Diagnosis not present

## 2021-11-01 DIAGNOSIS — Z992 Dependence on renal dialysis: Secondary | ICD-10-CM | POA: Diagnosis not present

## 2021-11-01 DIAGNOSIS — N2581 Secondary hyperparathyroidism of renal origin: Secondary | ICD-10-CM | POA: Diagnosis not present

## 2021-11-01 DIAGNOSIS — N186 End stage renal disease: Secondary | ICD-10-CM | POA: Diagnosis not present

## 2021-11-04 DIAGNOSIS — N2581 Secondary hyperparathyroidism of renal origin: Secondary | ICD-10-CM | POA: Diagnosis not present

## 2021-11-04 DIAGNOSIS — Z992 Dependence on renal dialysis: Secondary | ICD-10-CM | POA: Diagnosis not present

## 2021-11-04 DIAGNOSIS — N186 End stage renal disease: Secondary | ICD-10-CM | POA: Diagnosis not present

## 2021-11-05 DIAGNOSIS — N2581 Secondary hyperparathyroidism of renal origin: Secondary | ICD-10-CM | POA: Diagnosis not present

## 2021-11-05 DIAGNOSIS — Z992 Dependence on renal dialysis: Secondary | ICD-10-CM | POA: Diagnosis not present

## 2021-11-05 DIAGNOSIS — N186 End stage renal disease: Secondary | ICD-10-CM | POA: Diagnosis not present

## 2021-11-07 DIAGNOSIS — N2581 Secondary hyperparathyroidism of renal origin: Secondary | ICD-10-CM | POA: Diagnosis not present

## 2021-11-07 DIAGNOSIS — N186 End stage renal disease: Secondary | ICD-10-CM | POA: Diagnosis not present

## 2021-11-07 DIAGNOSIS — Z992 Dependence on renal dialysis: Secondary | ICD-10-CM | POA: Diagnosis not present

## 2021-11-08 DIAGNOSIS — Z992 Dependence on renal dialysis: Secondary | ICD-10-CM | POA: Diagnosis not present

## 2021-11-08 DIAGNOSIS — N2581 Secondary hyperparathyroidism of renal origin: Secondary | ICD-10-CM | POA: Diagnosis not present

## 2021-11-08 DIAGNOSIS — N186 End stage renal disease: Secondary | ICD-10-CM | POA: Diagnosis not present

## 2021-11-11 DIAGNOSIS — Z992 Dependence on renal dialysis: Secondary | ICD-10-CM | POA: Diagnosis not present

## 2021-11-11 DIAGNOSIS — N186 End stage renal disease: Secondary | ICD-10-CM | POA: Diagnosis not present

## 2021-11-11 DIAGNOSIS — N2581 Secondary hyperparathyroidism of renal origin: Secondary | ICD-10-CM | POA: Diagnosis not present

## 2021-11-12 DIAGNOSIS — Z992 Dependence on renal dialysis: Secondary | ICD-10-CM | POA: Diagnosis not present

## 2021-11-12 DIAGNOSIS — N186 End stage renal disease: Secondary | ICD-10-CM | POA: Diagnosis not present

## 2021-11-12 DIAGNOSIS — N2581 Secondary hyperparathyroidism of renal origin: Secondary | ICD-10-CM | POA: Diagnosis not present

## 2021-11-14 DIAGNOSIS — N186 End stage renal disease: Secondary | ICD-10-CM | POA: Diagnosis not present

## 2021-11-14 DIAGNOSIS — Z992 Dependence on renal dialysis: Secondary | ICD-10-CM | POA: Diagnosis not present

## 2021-11-14 DIAGNOSIS — N2581 Secondary hyperparathyroidism of renal origin: Secondary | ICD-10-CM | POA: Diagnosis not present

## 2021-11-15 DIAGNOSIS — N2581 Secondary hyperparathyroidism of renal origin: Secondary | ICD-10-CM | POA: Diagnosis not present

## 2021-11-15 DIAGNOSIS — N186 End stage renal disease: Secondary | ICD-10-CM | POA: Diagnosis not present

## 2021-11-15 DIAGNOSIS — Z992 Dependence on renal dialysis: Secondary | ICD-10-CM | POA: Diagnosis not present

## 2021-11-18 DIAGNOSIS — N2581 Secondary hyperparathyroidism of renal origin: Secondary | ICD-10-CM | POA: Diagnosis not present

## 2021-11-18 DIAGNOSIS — N186 End stage renal disease: Secondary | ICD-10-CM | POA: Diagnosis not present

## 2021-11-18 DIAGNOSIS — Z992 Dependence on renal dialysis: Secondary | ICD-10-CM | POA: Diagnosis not present

## 2021-11-19 DIAGNOSIS — N2581 Secondary hyperparathyroidism of renal origin: Secondary | ICD-10-CM | POA: Diagnosis not present

## 2021-11-19 DIAGNOSIS — N186 End stage renal disease: Secondary | ICD-10-CM | POA: Diagnosis not present

## 2021-11-19 DIAGNOSIS — Z992 Dependence on renal dialysis: Secondary | ICD-10-CM | POA: Diagnosis not present

## 2021-11-21 DIAGNOSIS — N186 End stage renal disease: Secondary | ICD-10-CM | POA: Diagnosis not present

## 2021-11-21 DIAGNOSIS — Z992 Dependence on renal dialysis: Secondary | ICD-10-CM | POA: Diagnosis not present

## 2021-11-21 DIAGNOSIS — N2581 Secondary hyperparathyroidism of renal origin: Secondary | ICD-10-CM | POA: Diagnosis not present

## 2021-11-22 DIAGNOSIS — N2581 Secondary hyperparathyroidism of renal origin: Secondary | ICD-10-CM | POA: Diagnosis not present

## 2021-11-22 DIAGNOSIS — Z992 Dependence on renal dialysis: Secondary | ICD-10-CM | POA: Diagnosis not present

## 2021-11-22 DIAGNOSIS — N186 End stage renal disease: Secondary | ICD-10-CM | POA: Diagnosis not present

## 2021-11-25 DIAGNOSIS — Z992 Dependence on renal dialysis: Secondary | ICD-10-CM | POA: Diagnosis not present

## 2021-11-25 DIAGNOSIS — N186 End stage renal disease: Secondary | ICD-10-CM | POA: Diagnosis not present

## 2021-11-25 DIAGNOSIS — N2581 Secondary hyperparathyroidism of renal origin: Secondary | ICD-10-CM | POA: Diagnosis not present

## 2021-11-26 DIAGNOSIS — Z992 Dependence on renal dialysis: Secondary | ICD-10-CM | POA: Diagnosis not present

## 2021-11-26 DIAGNOSIS — Z89511 Acquired absence of right leg below knee: Secondary | ICD-10-CM | POA: Diagnosis not present

## 2021-11-26 DIAGNOSIS — N2581 Secondary hyperparathyroidism of renal origin: Secondary | ICD-10-CM | POA: Diagnosis not present

## 2021-11-26 DIAGNOSIS — N186 End stage renal disease: Secondary | ICD-10-CM | POA: Diagnosis not present

## 2021-11-27 ENCOUNTER — Encounter (HOSPITAL_COMMUNITY): Payer: Self-pay | Admitting: Gastroenterology

## 2021-11-28 ENCOUNTER — Other Ambulatory Visit: Payer: Self-pay

## 2021-11-28 ENCOUNTER — Ambulatory Visit (INDEPENDENT_AMBULATORY_CARE_PROVIDER_SITE_OTHER): Payer: BC Managed Care – PPO | Admitting: Endocrinology

## 2021-11-28 ENCOUNTER — Telehealth: Payer: Self-pay | Admitting: Dietician

## 2021-11-28 VITALS — BP 124/70 | HR 90 | Ht 69.0 in | Wt 173.8 lb

## 2021-11-28 DIAGNOSIS — E1129 Type 2 diabetes mellitus with other diabetic kidney complication: Secondary | ICD-10-CM | POA: Diagnosis not present

## 2021-11-28 DIAGNOSIS — Z992 Dependence on renal dialysis: Secondary | ICD-10-CM | POA: Diagnosis not present

## 2021-11-28 DIAGNOSIS — N2581 Secondary hyperparathyroidism of renal origin: Secondary | ICD-10-CM | POA: Diagnosis not present

## 2021-11-28 DIAGNOSIS — E119 Type 2 diabetes mellitus without complications: Secondary | ICD-10-CM | POA: Diagnosis not present

## 2021-11-28 DIAGNOSIS — N186 End stage renal disease: Secondary | ICD-10-CM | POA: Diagnosis not present

## 2021-11-28 DIAGNOSIS — E1122 Type 2 diabetes mellitus with diabetic chronic kidney disease: Secondary | ICD-10-CM | POA: Diagnosis not present

## 2021-11-28 LAB — POCT GLYCOSYLATED HEMOGLOBIN (HGB A1C): Hemoglobin A1C: 7.9 % — AB (ref 4.0–5.6)

## 2021-11-28 MED ORDER — FREESTYLE LIBRE 2 SENSOR MISC: 1.0000 | 3 refills | Status: DC

## 2021-11-28 MED ORDER — FREESTYLE LIBRE 2 READER DEVI: 1.0000 | Freq: Once | 1 refills | Status: AC

## 2021-11-28 NOTE — Telephone Encounter (Signed)
PT called needing the acct or code for the following: Insulin Disposable Pump (OMNIPOD 5 G6 POD, GEN 5,) MISC He can be reached at [phone]

## 2021-11-28 NOTE — Patient Instructions (Addendum)
check your blood sugar 4 times a day: before the 3 meals, and at bedtime.  also check if you have symptoms of your blood sugar being too high or too low.  please keep a record of the readings and bring it to your next appointment here (or you can bring the meter itself).  You can write it on any piece of paper.  please call us sooner if your blood sugar goes below 70, or if you have a lot of readings over 200.  Please take these settings: basal rate of 0.15 units/hr. bolus of 1 unit/6 grams carbohydrate.  However, please add 2 units to whichever meal you eat at 12-2 PM.  correction bolus (which some people call "sensitivity," or "insulin sensitivity ratio," or just "isr") of 1 unit for each 100 by which your glucose exceeds 120.    please call the tech support number to get set up for the [place].   Please come back for a follow-up appointment in 2 months.

## 2021-11-28 NOTE — Progress Notes (Signed)
Subjective:    Patient ID: Duane Hall, male    DOB: 31-May-1956, 65 y.o.   MRN: [id number]  HPI Pt returns for f/u of diabetes mellitus: DM type: Insulin-requiring type 2, but he is at risk for evolving type 1.  Dx'ed: 6295 Complications: ESRD, foot ulcers, GP, PAD, and PDR.   Therapy: insulin since 2021 (Omnipod Dash since 1/22), and [place] CGM.   DKA: never Severe hypoglycemia: never.  Pancreatitis: never Pancreatic imaging: normal on 2019 CT SDOH: Wife provides most hx, due to pt's poor overall health; he eats meals at 10AM, (sometimes 2PM), and 6PM; He eats small breakfast and lunch, and a larger dinner.  Other: fructosamine converts to higher avg glucose than A1c, prob due to renal failure; he has [place] continuous glucose monitor; he stopped Trulicity, due to GP; he eats meals at 10AM, 1PM, and 6PM, but these times vary widely.  he has very long duration of action of insulin.  Interval history: we are unable to access pump settings.  I reviewed continuous glucose monitor data.  Glucose varies from 80-330.  It increases 1PM-8PM, then decreases until 12MN.   Past Medical History:  Diagnosis Date   Asthma    Cancer ([place])    Cataract    CKD (chronic kidney disease), stage III ([place])    Diabetes mellitus    Glaucoma    Vascular insufficiency 05/2020    Past Surgical History:  Procedure Laterality Date   ABDOMINAL AORTOGRAM W/LOWER EXTREMITY N/A 06/19/2020   Procedure: ABDOMINAL AORTOGRAM W/LOWER EXTREMITY;  Surgeon: Waynetta Sandy, MD;  Location: [place] [facility];  Service: Cardiovascular;  Laterality: N/A;   ABDOMINAL AORTOGRAM W/LOWER EXTREMITY Left 10/16/2020   Procedure: ABDOMINAL AORTOGRAM W/LOWER EXTREMITY;  Surgeon: Waynetta Sandy, MD;  Location: [place] [facility];  Service: Cardiovascular;  Laterality: Left;   ABDOMINAL AORTOGRAM W/LOWER EXTREMITY N/A 12/25/2020   Procedure: ABDOMINAL AORTOGRAM W/LOWER EXTREMITY;  Surgeon: Waynetta Sandy,  MD;  Location: [place] [facility];  Service: Cardiovascular;  Laterality: N/A;   AMPUTATION Right 01/12/2021   Procedure: 1ST AMPUTATION RAY;  Surgeon: [name], DPM;  Location: [place];  Service: Podiatry;  Laterality: Right;   AMPUTATION Right 03/13/2021   Procedure: RIGHT BELOW KNEE AMPUTATION;  Surgeon: Waynetta Sandy, MD;  Location: [place];  Service: Vascular;  Laterality: Right;   AV FISTULA PLACEMENT Right 09/14/2019   Procedure: Right Arm Basilic Vein transposition;  Surgeon: Angelia Mould, MD;  Location: [place];  Service: Vascular;  Laterality: Right;   BONE BIOPSY Left 07/29/2020   Procedure: BONE BIOPSY;  Surgeon: [name], DPM;  Location: [place];  Service: Podiatry;  Laterality: Left;  Need bone trephines and/or large bore Giamshidi   COLONOSCOPY     PERIPHERAL VASCULAR ATHERECTOMY Left 06/19/2020   Procedure: PERIPHERAL VASCULAR ATHERECTOMY;  Surgeon: Waynetta Sandy, MD;  Location: [place] [facility];  Service: Cardiovascular;  Laterality: Left;  SFA   PERIPHERAL VASCULAR ATHERECTOMY  12/25/2020   Procedure: PERIPHERAL VASCULAR ATHERECTOMY;  Surgeon: Waynetta Sandy, MD;  Location: [place] [facility];  Service: Cardiovascular;;  Lt. PT - Laser Lt. SFA - Laser   PERIPHERAL VASCULAR BALLOON ANGIOPLASTY  12/25/2020   Procedure: PERIPHERAL VASCULAR BALLOON ANGIOPLASTY;  Surgeon: Waynetta Sandy, MD;  Location: [place] [facility];  Service: Cardiovascular;;  Lt. SFA and PT   UPPER GASTROINTESTINAL ENDOSCOPY  02/2019   Dr Benson [place]      Social History  Socioeconomic History   Marital status: Married    Spouse name: Not on file   Number of children: Not on file   Years of education: Not on file   Highest education level: Not on file  Occupational History   Occupation: Chief Financial Officer  Tobacco Use   Smoking status: Every Day    Packs/day: 0.30    Types: Cigarettes   Smokeless tobacco: Never  Vaping Use   Vaping Use: Never used   Substance and Sexual Activity   Alcohol use: No    Alcohol/week: 0.0 standard drinks   Drug use: No   Sexual activity: Not on file  Other Topics Concern   Not on file  Social History Narrative   Married.    Social Determinants of Health   Financial Resource Strain: Not on file  Food Insecurity: Not on file  Transportation Needs: Not on file  Physical Activity: Not on file  Stress: Not on file  Social Connections: Not on file  Intimate Partner Violence: Not on file    Current Outpatient Medications on File Prior to Visit  Medication Sig Dispense Refill   aspirin EC 81 MG EC tablet Take 1 tablet (81 mg total) by mouth daily. Swallow whole. (Patient taking differently: Take 81 mg by mouth every morning. Swallow whole.) 30 tablet 11   clopidogrel (PLAVIX) 75 MG tablet Take 1 tablet (75 mg total) by mouth daily with breakfast. (Patient taking differently: Take 75 mg by mouth every morning.) 30 tablet 3   docusate sodium (COLACE) 100 MG capsule Take 100 mg by mouth every morning.     ferric citrate (AURYXIA) 1 GM 210 MG(Fe) tablet Take 210 mg by mouth 2 (two) times daily with a meal.     Glucagon (GVOKE HYPOPEN 1-PACK) 1 MG/0.2ML SOAJ Inject 1 mg into the skin once as needed (low blood sugar).     Insulin Disposable Pump (OMNIPOD 5 G6 POD, GEN 5,) MISC 1 Device by Does not apply route every 3 (three) days. 30 each 3   iron sucrose in sodium chloride 0.9 % 100 mL Iron Sucrose (Venofer) Self Administer at Home     Methoxy PEG-Epoetin Beta (MIRCERA IJ) Inject into the skin.     multivitamin (RENA-VIT) TABS tablet Take 1 tablet by mouth every morning.     pantoprazole (PROTONIX) 40 MG tablet Take 1 tablet (40 mg total) by mouth daily.     rosuvastatin (CRESTOR) 10 MG tablet TAKE 1 TABLET BY MOUTH DAILY (Patient taking differently: Take 10 mg by mouth every morning.) 30 tablet 11   No current facility-administered medications on file prior to visit.    Allergies  Allergen Reactions    Amoxicillin-Pot Clavulanate Diarrhea and Other (See Comments)   Midodrine Other (See Comments)    Other reaction(s): Urinary Sensation   Tape Other (See Comments)    bandaids cause blistering    Family History  Problem Relation Age of Onset   Cancer Father    Diabetes Mother     BP 124/70   Pulse 90   Ht 5\' 9"  (1.753 m)   Wt 173 lb 12.8 oz (78.8 kg)   SpO2 98%   BMI 25.67 kg/m     Review of Systems He denies hypoglycemia.      Objective:   Physical Exam   Lab Results  Component Value Date   HGBA1C 7.9 (A) 11/28/2021       Assessment & Plan:  Insulin-requiring type 2 DM: uncontrolled   Patient Instructions  check your blood sugar 4 times a day: before the 3 meals, and at bedtime.  also check if you have symptoms of your blood sugar being too high or too low.  please keep a record of the readings and bring it to your next appointment here (or you can bring the meter itself).  You can write it on any piece of paper.  please call us sooner if your blood sugar goes below 70, or if you have a lot of readings over 200.  Please take these settings: basal rate of 0.15 units/hr. bolus of 1 unit/6 grams carbohydrate.  However, please add 2 units to whichever meal you eat at 12-2 PM.  correction bolus (which some people call "sensitivity," or "insulin sensitivity ratio," or just "isr") of 1 unit for each 100 by which your glucose exceeds 120.    please call the tech support number to get set up for the [place].   Please come back for a follow-up appointment in 2 months.

## 2021-11-29 DIAGNOSIS — N186 End stage renal disease: Secondary | ICD-10-CM | POA: Diagnosis not present

## 2021-11-29 DIAGNOSIS — N2581 Secondary hyperparathyroidism of renal origin: Secondary | ICD-10-CM | POA: Diagnosis not present

## 2021-11-29 DIAGNOSIS — Z992 Dependence on renal dialysis: Secondary | ICD-10-CM | POA: Diagnosis not present

## 2021-11-29 MED ORDER — DEXCOM G6 RECEIVER DEVI: 0 refills | Status: DC

## 2021-11-29 MED ORDER — DEXCOM G6 TRANSMITTER MISC: 3 refills | Status: DC

## 2021-11-29 MED ORDER — INSULIN LISPRO 100 UNIT/ML IJ SOLN: INTRAMUSCULAR | 3 refills | Status: DC

## 2021-11-29 MED ORDER — DEXCOM G6 SENSOR MISC: 1.0000 | 3 refills | Status: DC

## 2021-11-29 NOTE — Telephone Encounter (Signed)
Returned patient call and spoke with patient's wife.  Patient was also present.  Patient states that he needs a code.  We think this may be a Proconnect Code.  This was given.  Wife states that her insurance is changing effective December 30, 2021.  [facility] can no longer be used and prescriptions need to be sent to [place] will no long be covered and he will need a prescription for Humalog or Lyumjev 3.  FreeStyle Elenor [place] will no longer be covered.  He has a Dexcom but has not started using this and will need a prescription sent in for sensors and transmitter.  MD messaged regarding these changes.  Discussed phone needs for Dexcom G6 compatibility.  Antonieta Iba, RD, LDN, CDCES

## 2021-12-02 DIAGNOSIS — N186 End stage renal disease: Secondary | ICD-10-CM | POA: Diagnosis not present

## 2021-12-02 DIAGNOSIS — Z992 Dependence on renal dialysis: Secondary | ICD-10-CM | POA: Diagnosis not present

## 2021-12-02 DIAGNOSIS — N2581 Secondary hyperparathyroidism of renal origin: Secondary | ICD-10-CM | POA: Diagnosis not present

## 2021-12-03 ENCOUNTER — Telehealth: Payer: Self-pay

## 2021-12-03 DIAGNOSIS — N186 End stage renal disease: Secondary | ICD-10-CM | POA: Diagnosis not present

## 2021-12-03 DIAGNOSIS — Z992 Dependence on renal dialysis: Secondary | ICD-10-CM | POA: Diagnosis not present

## 2021-12-03 DIAGNOSIS — N2581 Secondary hyperparathyroidism of renal origin: Secondary | ICD-10-CM | POA: Diagnosis not present

## 2021-12-03 NOTE — Telephone Encounter (Signed)
Pt's wife called regarding holding pt's Aspirin and Plavix for colonoscopy later this week. Medication clearance form has been faxed to Dr. Ulyses Amor office. Pt's wife is aware pt is to hold meds for 4-5 days and then resume after procedure. No further questions/concerns at this time.

## 2021-12-05 DIAGNOSIS — Z992 Dependence on renal dialysis: Secondary | ICD-10-CM | POA: Diagnosis not present

## 2021-12-05 DIAGNOSIS — N2581 Secondary hyperparathyroidism of renal origin: Secondary | ICD-10-CM | POA: Diagnosis not present

## 2021-12-05 DIAGNOSIS — N186 End stage renal disease: Secondary | ICD-10-CM | POA: Diagnosis not present

## 2021-12-06 DIAGNOSIS — N186 End stage renal disease: Secondary | ICD-10-CM | POA: Diagnosis not present

## 2021-12-06 DIAGNOSIS — N2581 Secondary hyperparathyroidism of renal origin: Secondary | ICD-10-CM | POA: Diagnosis not present

## 2021-12-06 DIAGNOSIS — Z992 Dependence on renal dialysis: Secondary | ICD-10-CM | POA: Diagnosis not present

## 2021-12-07 ENCOUNTER — Ambulatory Visit (HOSPITAL_COMMUNITY): Payer: BC Managed Care – PPO | Admitting: Anesthesiology

## 2021-12-07 ENCOUNTER — Encounter (HOSPITAL_COMMUNITY)
Admission: RE | Disposition: A | Discharge status: Home / Self Care | Payer: Self-pay | Source: Home / Self Care | Attending: Gastroenterology

## 2021-12-07 ENCOUNTER — Ambulatory Visit (HOSPITAL_COMMUNITY)
Admission: RE | Admit: 2021-12-07 | Discharge: 2021-12-07 | Disposition: A | Discharge status: Home / Self Care | Payer: BC Managed Care – PPO | Attending: Gastroenterology | Admitting: Gastroenterology

## 2021-12-07 ENCOUNTER — Encounter (HOSPITAL_COMMUNITY): Payer: Self-pay | Admitting: Gastroenterology

## 2021-12-07 ENCOUNTER — Other Ambulatory Visit: Payer: Self-pay

## 2021-12-07 DIAGNOSIS — Z1211 Encounter for screening for malignant neoplasm of colon: Secondary | ICD-10-CM | POA: Diagnosis not present

## 2021-12-07 DIAGNOSIS — F17210 Nicotine dependence, cigarettes, uncomplicated: Secondary | ICD-10-CM | POA: Insufficient documentation

## 2021-12-07 DIAGNOSIS — D696 Thrombocytopenia, unspecified: Secondary | ICD-10-CM | POA: Diagnosis not present

## 2021-12-07 DIAGNOSIS — E7800 Pure hypercholesterolemia, unspecified: Secondary | ICD-10-CM | POA: Diagnosis not present

## 2021-12-07 DIAGNOSIS — K635 Polyp of colon: Secondary | ICD-10-CM | POA: Diagnosis not present

## 2021-12-07 DIAGNOSIS — D123 Benign neoplasm of transverse colon: Secondary | ICD-10-CM | POA: Insufficient documentation

## 2021-12-07 DIAGNOSIS — Z139 Encounter for screening, unspecified: Secondary | ICD-10-CM | POA: Diagnosis not present

## 2021-12-07 HISTORY — PX: POLYPECTOMY: SHX5525

## 2021-12-07 HISTORY — PX: COLONOSCOPY WITH PROPOFOL: SHX5780

## 2021-12-07 LAB — GLUCOSE, CAPILLARY: Glucose-Capillary: 175 mg/dL — ABNORMAL HIGH (ref 70–99)

## 2021-12-07 SURGERY — COLONOSCOPY WITH PROPOFOL
Anesthesia: Monitor Anesthesia Care

## 2021-12-07 MED ORDER — PROPOFOL 500 MG/50ML IV EMUL
INTRAVENOUS | Status: DC | PRN
Start: 2021-12-07 — End: 2021-12-07
  Administered 2021-12-07: 100 ug/kg/min via INTRAVENOUS

## 2021-12-07 MED ORDER — SODIUM CHLORIDE 0.9 % IV SOLN: INTRAVENOUS | Status: DC

## 2021-12-07 MED ORDER — EPHEDRINE SULFATE 50 MG/ML IJ SOLN
INTRAMUSCULAR | Status: DC | PRN
  Administered 2021-12-07: 7.5 mg via INTRAVENOUS

## 2021-12-07 MED ORDER — LACTATED RINGERS IV SOLN: INTRAVENOUS | Status: DC | PRN

## 2021-12-07 MED ORDER — PROPOFOL 10 MG/ML IV BOLUS
INTRAVENOUS | Status: DC | PRN
  Administered 2021-12-07: 30 mg via INTRAVENOUS
  Administered 2021-12-07: 50 mg via INTRAVENOUS

## 2021-12-07 MED ORDER — LIDOCAINE 2% (20 MG/ML) 5 ML SYRINGE
INTRAMUSCULAR | Status: DC | PRN
  Administered 2021-12-07: 60 mg via INTRAVENOUS

## 2021-12-07 SURGICAL SUPPLY — 21 items

## 2021-12-07 NOTE — Anesthesia Postprocedure Evaluation (Signed)
Anesthesia Post Note  Patient: Harlo Fabela [facility]  Procedure(s) Performed: COLONOSCOPY WITH PROPOFOL POLYPECTOMY     Patient location during evaluation: PACU Anesthesia Type: MAC Level of consciousness: awake and alert and oriented Pain management: pain level controlled Vital Signs Assessment: post-procedure vital signs reviewed and stable Respiratory status: spontaneous breathing, nonlabored ventilation and respiratory function stable Cardiovascular status: blood pressure returned to baseline Postop Assessment: no apparent nausea or vomiting Anesthetic complications: no   No notable events documented.  Last Vitals:  Vitals:   12/07/21 1120 12/07/21 1130  BP: (!) 101/55 (!) 112/58  Pulse: 84 83  Resp: 16 16  Temp:    SpO2: 100% 100%    Last Pain:  Vitals:   12/07/21 1120  TempSrc:   PainSc: 0-No pain   Pain Goal:                   Marthenia Rolling

## 2021-12-07 NOTE — Transfer of Care (Signed)
Immediate Anesthesia Transfer of Care Note  Patient: Duane Hall [facility]  Procedure(s) Performed: COLONOSCOPY WITH PROPOFOL POLYPECTOMY  Patient Location: PACU  Anesthesia Type:MAC  Level of Consciousness: awake, alert , oriented and patient cooperative  Airway & Oxygen Therapy: Patient Spontanous Breathing and Patient connected to face mask oxygen  Post-op Assessment: Report given to RN and Post -op Vital signs reviewed and stable  Post vital signs: Reviewed and stable  Last Vitals:  Vitals Value Taken Time  BP 73/42 12/07/21 1108  Temp    Pulse 80 12/07/21 1111  Resp 17 12/07/21 1111  SpO2 100 % 12/07/21 1111  Vitals shown include unvalidated device data.  Last Pain:  Vitals:   12/07/21 1020  TempSrc: Oral         Complications: No notable events documented.

## 2021-12-07 NOTE — H&P (Signed)
Duane Hall HPI: His last colonoscopy was on 02/22/2011 with Dr. Michail Sermon.  Multiple and large adenomas were identified and removed.  He did not have a follow up colonoscopy over all these years because of his health issues.   Past Medical History:  Diagnosis Date   Asthma    Cancer ([place])    Cataract    CKD (chronic kidney disease), stage III ([place])    Diabetes mellitus    Glaucoma    Vascular insufficiency 05/2020    Past Surgical History:  Procedure Laterality Date   ABDOMINAL AORTOGRAM W/LOWER EXTREMITY N/A 06/19/2020   Procedure: ABDOMINAL AORTOGRAM W/LOWER EXTREMITY;  Surgeon: Waynetta Sandy, MD;  Location: [place] [facility];  Service: Cardiovascular;  Laterality: N/A;   ABDOMINAL AORTOGRAM W/LOWER EXTREMITY Left 10/16/2020   Procedure: ABDOMINAL AORTOGRAM W/LOWER EXTREMITY;  Surgeon: Waynetta Sandy, MD;  Location: [place] [facility];  Service: Cardiovascular;  Laterality: Left;   ABDOMINAL AORTOGRAM W/LOWER EXTREMITY N/A 12/25/2020   Procedure: ABDOMINAL AORTOGRAM W/LOWER EXTREMITY;  Surgeon: Waynetta Sandy, MD;  Location: [place] [facility];  Service: Cardiovascular;  Laterality: N/A;   AMPUTATION Right 01/12/2021   Procedure: 1ST AMPUTATION RAY;  Surgeon: [name], DPM;  Location: [place];  Service: Podiatry;  Laterality: Right;   AMPUTATION Right 03/13/2021   Procedure: RIGHT BELOW KNEE AMPUTATION;  Surgeon: Waynetta Sandy, MD;  Location: [place];  Service: Vascular;  Laterality: Right;   AV FISTULA PLACEMENT Right 09/14/2019   Procedure: Right Arm Basilic Vein transposition;  Surgeon: Angelia Mould, MD;  Location: [place];  Service: Vascular;  Laterality: Right;   BONE BIOPSY Left 07/29/2020   Procedure: BONE BIOPSY;  Surgeon: [name], DPM;  Location: [place];  Service: Podiatry;  Laterality: Left;  Need bone trephines and/or large bore Giamshidi   COLONOSCOPY     PERIPHERAL VASCULAR ATHERECTOMY Left 06/19/2020    Procedure: PERIPHERAL VASCULAR ATHERECTOMY;  Surgeon: Waynetta Sandy, MD;  Location: [place] [facility];  Service: Cardiovascular;  Laterality: Left;  SFA   PERIPHERAL VASCULAR ATHERECTOMY  12/25/2020   Procedure: PERIPHERAL VASCULAR ATHERECTOMY;  Surgeon: Waynetta Sandy, MD;  Location: [place] [facility];  Service: Cardiovascular;;  Lt. PT - Laser Lt. SFA - Laser   PERIPHERAL VASCULAR BALLOON ANGIOPLASTY  12/25/2020   Procedure: PERIPHERAL VASCULAR BALLOON ANGIOPLASTY;  Surgeon: Waynetta Sandy, MD;  Location: [place] [facility];  Service: Cardiovascular;;  Lt. SFA and PT   UPPER GASTROINTESTINAL ENDOSCOPY  02/2019   Dr Benson [place]      Family History  Problem Relation Age of Onset   Cancer Father    Diabetes Mother     Social History:  reports that he has been smoking cigarettes. He has been smoking an average of .3 packs per day. He has never used smokeless tobacco. He reports that he does not drink alcohol and does not use drugs.  Allergies:  Allergies  Allergen Reactions   Amoxicillin-Pot Clavulanate Diarrhea   Midodrine Other (See Comments)    Other reaction(s): Urinary Sensation   Tape Other (See Comments)    Latex band aids cause blistering    Medications: Scheduled: Continuous:  No results found for this or any previous visit (from the past 24 hour(s)).   No results found.  ROS:  As stated above in the HPI otherwise negative.  There were no vitals taken for this visit.    PE: Gen: NAD, Alert and Oriented HEENT:  [place]/AT,  EOMI Neck: Supple, no LAD Lungs: CTA Bilaterally CV: RRR without M/G/R ABD: Soft, NTND, +BS Ext: No C/C/E  Assessment/Plan: 1) Screening colonoscopy.  Duane Hall D 12/07/2021, 9:05 AM

## 2021-12-07 NOTE — Op Note (Addendum)
[facility] Patient Name: Duane Hall Procedure Date: 12/07/2021 MRN: [id number] Attending MD: Carol Ada , MD Date of Birth: August 29, 1956 CSN: [id number] Age: 65 Admit Type: Outpatient Procedure:                Colonoscopy Indications:              Screening for colorectal malignant neoplasm Providers:                Carol Ada, MD, Grace Isaac, RN, Frazier Richards,                            Technician Referring MD:              Medicines:                Propofol per Anesthesia Complications:            No immediate complications. Estimated Blood Loss:     Estimated blood loss: none. Procedure:                Pre-Anesthesia Assessment:                           - Prior to the procedure, a History and Physical                            was performed, and patient medications and                            allergies were reviewed. The patient's tolerance of                            previous anesthesia was also reviewed. The risks                            and benefits of the procedure and the sedation                            options and risks were discussed with the patient.                            All questions were answered, and informed consent                            was obtained. Prior Anticoagulants: The patient has                            taken Plavix (clopidogrel), last dose was 5 days                            prior to procedure. ASA Grade Assessment: III - A                            patient with severe systemic disease. After  reviewing the risks and benefits, the patient was                            deemed in satisfactory condition to undergo the                            procedure.                           - Sedation was administered by an anesthesia                            professional. Deep sedation was attained.                           After obtaining informed consent, the colonoscope                             was passed under direct vision. Throughout the                            procedure, the patient's blood pressure, pulse, and                            oxygen saturations were monitored continuously. The                            CF-HQ190L ([id number]) Olympus colonoscope was                            introduced through the anus and advanced to the the                            cecum, identified by appendiceal orifice and                            ileocecal valve. The colonoscopy was technically                            difficult and complex due to poor bowel prep.                            Successful completion of the procedure was aided by                            lavage. The patient tolerated the procedure well.                            The quality of the bowel preparation was evaluated                            using the BBPS [facility] Bowel Preparation Scale)  with scores of: Right Colon = 1 (portion of mucosa                            seen, but other areas not well seen due to                            staining, residual stool and/or opaque liquid),                            Transverse Colon = 1 (portion of mucosa seen, but                            other areas not well seen due to staining, residual                            stool and/or opaque liquid) and Left Colon = 1                            (portion of mucosa seen, but other areas not well                            seen due to staining, residual stool and/or opaque                            liquid). The total BBPS score equals 3. The quality                            of the bowel preparation was inadequate. The                            ileocecal valve, appendiceal orifice, and rectum                            were photographed. Scope In: 10:50:18 AM Scope Out: 11:01:20 AM Scope Withdrawal Time: 0 hours 6 minutes 15 seconds  Total Procedure Duration: 0 hours 11 minutes  2 seconds  Findings:      A 4 mm polyp was found in the hepatic flexure. The polyp was sessile.       The polyp was removed with a cold snare. Resection and retrieval were       complete.      The patient did not take the second portion of the prep. The prep was       poor. Attempts to clean the colon to obtain adequate views failed. Impression:               - Preparation of the colon was inadequate.                           - One 4 mm polyp at the hepatic flexure, removed                            with a cold snare. Resected and retrieved. Moderate Sedation:      Not  Applicable - Patient had care per Anesthesia. Recommendation:           - Patient has a contact number available for                            emergencies. The signs and symptoms of potential                            delayed complications were discussed with the                            patient. Return to normal activities tomorrow.                            Written discharge instructions were provided to the                            patient.                           - Resume previous diet.                           - Continue present medications.                           - Await pathology results.                           - Repeat colonoscopy at the next available                            appointment for surveillance.                           - Restart Plavix. Procedure Code(s):        --- Professional ---                           [phone], Colonoscopy, flexible; with removal of                            tumor(s), polyp(s), or other lesion(s) by snare                            technique Diagnosis Code(s):        --- Professional ---                           Z12.11, Encounter for screening for malignant                            neoplasm of colon                           K63.5, Polyp of colon CPT copyright 2019 American Medical Association. All rights reserved. The codes documented in this report are  preliminary and upon  coder review may  be revised to meet current compliance requirements. Carol Ada, MD Carol Ada, MD 12/07/2021 11:07:30 AM This report has been signed electronically. Number of Addenda: 0

## 2021-12-07 NOTE — Discharge Instructions (Signed)

## 2021-12-07 NOTE — Anesthesia Preprocedure Evaluation (Addendum)
Anesthesia Evaluation  Patient identified by MRN, date of birth, ID band Patient awake    Reviewed: Allergy & Precautions, NPO status , Patient's Chart, lab work & pertinent test results  History of Anesthesia Complications Negative for: history of anesthetic complications  Airway Mallampati: II  TM Distance: >3 FB Neck ROM: Full    Dental  (+) Missing,    Pulmonary asthma , COPD, Current Smoker,    Pulmonary exam normal        Cardiovascular hypertension, + Peripheral Vascular Disease (on Plavix)  Normal cardiovascular exam     Neuro/Psych negative neurological ROS  negative psych ROS   GI/Hepatic GERD  Medicated,(+) Hepatitis -, C  Endo/Other  diabetes, Type 2, Insulin Dependent  Renal/GU ESRF and DialysisRenal disease  negative genitourinary   Musculoskeletal  (+) Arthritis ,   Abdominal   Peds  Hematology negative hematology ROS (+)   Anesthesia Other Findings Day of surgery medications reviewed with patient.  Reproductive/Obstetrics negative OB ROS                           Anesthesia Physical Anesthesia Plan  ASA: 3  Anesthesia Plan: MAC   Post-op Pain Management: Minimal or no pain anticipated   Induction:   PONV Risk Score and Plan: Treatment may vary due to age or medical condition and Propofol infusion  Airway Management Planned: Natural Airway and Nasal Cannula  Additional Equipment: None  Intra-op Plan:   Post-operative Plan:   Informed Consent:   Plan Discussed with: CRNA  Anesthesia Plan Comments:        Anesthesia Quick Evaluation

## 2021-12-10 DIAGNOSIS — N2581 Secondary hyperparathyroidism of renal origin: Secondary | ICD-10-CM | POA: Diagnosis not present

## 2021-12-10 DIAGNOSIS — N186 End stage renal disease: Secondary | ICD-10-CM | POA: Diagnosis not present

## 2021-12-10 DIAGNOSIS — Z992 Dependence on renal dialysis: Secondary | ICD-10-CM | POA: Diagnosis not present

## 2021-12-10 LAB — SURGICAL PATHOLOGY

## 2021-12-10 LAB — POCT I-STAT, CHEM 8
BUN: 25 mg/dL — ABNORMAL HIGH (ref 8–23)
Calcium, Ion: 1 mmol/L — ABNORMAL LOW (ref 1.15–1.40)
Chloride: 87 mmol/L — ABNORMAL LOW (ref 98–111)
Creatinine, Ser: 5.1 mg/dL — ABNORMAL HIGH (ref 0.61–1.24)
Glucose, Bld: 172 mg/dL — ABNORMAL HIGH (ref 70–99)
HCT: 36 % — ABNORMAL LOW (ref 39.0–52.0)
Hemoglobin: 12.2 g/dL — ABNORMAL LOW (ref 13.0–17.0)
Potassium: 3.1 mmol/L — ABNORMAL LOW (ref 3.5–5.1)
Sodium: 131 mmol/L — ABNORMAL LOW (ref 135–145)
TCO2: 31 mmol/L (ref 22–32)

## 2021-12-11 ENCOUNTER — Encounter (HOSPITAL_COMMUNITY): Payer: Self-pay | Admitting: Gastroenterology

## 2021-12-11 DIAGNOSIS — N186 End stage renal disease: Secondary | ICD-10-CM | POA: Diagnosis not present

## 2021-12-11 DIAGNOSIS — N2581 Secondary hyperparathyroidism of renal origin: Secondary | ICD-10-CM | POA: Diagnosis not present

## 2021-12-11 DIAGNOSIS — Z992 Dependence on renal dialysis: Secondary | ICD-10-CM | POA: Diagnosis not present

## 2021-12-12 DIAGNOSIS — N186 End stage renal disease: Secondary | ICD-10-CM | POA: Diagnosis not present

## 2021-12-12 DIAGNOSIS — N2581 Secondary hyperparathyroidism of renal origin: Secondary | ICD-10-CM | POA: Diagnosis not present

## 2021-12-12 DIAGNOSIS — Z992 Dependence on renal dialysis: Secondary | ICD-10-CM | POA: Diagnosis not present

## 2021-12-13 DIAGNOSIS — N2581 Secondary hyperparathyroidism of renal origin: Secondary | ICD-10-CM | POA: Diagnosis not present

## 2021-12-13 DIAGNOSIS — Z992 Dependence on renal dialysis: Secondary | ICD-10-CM | POA: Diagnosis not present

## 2021-12-13 DIAGNOSIS — N186 End stage renal disease: Secondary | ICD-10-CM | POA: Diagnosis not present

## 2021-12-16 DIAGNOSIS — N2581 Secondary hyperparathyroidism of renal origin: Secondary | ICD-10-CM | POA: Diagnosis not present

## 2021-12-16 DIAGNOSIS — N186 End stage renal disease: Secondary | ICD-10-CM | POA: Diagnosis not present

## 2021-12-16 DIAGNOSIS — Z992 Dependence on renal dialysis: Secondary | ICD-10-CM | POA: Diagnosis not present

## 2021-12-17 DIAGNOSIS — N2581 Secondary hyperparathyroidism of renal origin: Secondary | ICD-10-CM | POA: Diagnosis not present

## 2021-12-17 DIAGNOSIS — Z992 Dependence on renal dialysis: Secondary | ICD-10-CM | POA: Diagnosis not present

## 2021-12-17 DIAGNOSIS — N186 End stage renal disease: Secondary | ICD-10-CM | POA: Diagnosis not present

## 2021-12-19 DIAGNOSIS — N2581 Secondary hyperparathyroidism of renal origin: Secondary | ICD-10-CM | POA: Diagnosis not present

## 2021-12-19 DIAGNOSIS — N186 End stage renal disease: Secondary | ICD-10-CM | POA: Diagnosis not present

## 2021-12-19 DIAGNOSIS — Z992 Dependence on renal dialysis: Secondary | ICD-10-CM | POA: Diagnosis not present

## 2021-12-20 DIAGNOSIS — N2581 Secondary hyperparathyroidism of renal origin: Secondary | ICD-10-CM | POA: Diagnosis not present

## 2021-12-20 DIAGNOSIS — Z992 Dependence on renal dialysis: Secondary | ICD-10-CM | POA: Diagnosis not present

## 2021-12-20 DIAGNOSIS — N186 End stage renal disease: Secondary | ICD-10-CM | POA: Diagnosis not present

## 2022-01-03 ENCOUNTER — Encounter: Payer: Self-pay | Admitting: Endocrinology

## 2022-01-04 ENCOUNTER — Other Ambulatory Visit (HOSPITAL_COMMUNITY): Payer: Self-pay

## 2022-01-04 ENCOUNTER — Telehealth: Payer: Self-pay

## 2022-01-04 NOTE — Telephone Encounter (Signed)
Patient Advocate Encounter  Prior Authorization for [employer] has been approved.    PA# [id number]  Effective dates: 01/04/22 through 01/04/23  Per Test Claim Patients co-pay is $60 for 90 days supply.  $30 for 30 day supply   Spoke with Pharmacy to Process.  Patient Advocate Fax: [phone]

## 2022-01-04 NOTE — Telephone Encounter (Signed)
Patient Advocate Encounter   Received notification from patient calls that prior authorization for Dexcom G6 Transmitter is required by his/her insurance [employer].   PA submitted on 01/04/22  Key#: [id number]  Status is pending    [facility] will continue to follow:  Patient Advocate Fax: [phone]

## 2022-01-29 ENCOUNTER — Other Ambulatory Visit: Payer: Self-pay

## 2022-01-29 ENCOUNTER — Ambulatory Visit: Payer: BC Managed Care – PPO | Admitting: Endocrinology

## 2022-01-29 VITALS — BP 80/60 | HR 97 | Ht 69.0 in | Wt 179.8 lb

## 2022-01-29 DIAGNOSIS — Z992 Dependence on renal dialysis: Secondary | ICD-10-CM | POA: Diagnosis not present

## 2022-01-29 DIAGNOSIS — E1165 Type 2 diabetes mellitus with hyperglycemia: Secondary | ICD-10-CM

## 2022-01-29 DIAGNOSIS — E1122 Type 2 diabetes mellitus with diabetic chronic kidney disease: Secondary | ICD-10-CM

## 2022-01-29 DIAGNOSIS — N186 End stage renal disease: Secondary | ICD-10-CM | POA: Diagnosis not present

## 2022-01-29 LAB — POCT GLYCOSYLATED HEMOGLOBIN (HGB A1C): Hemoglobin A1C: 7.1 % — AB (ref 4.0–5.6)

## 2022-01-29 NOTE — Progress Notes (Signed)
Subjective:    Patient ID: Duane Hall, male    DOB: 1956-03-23, 66 y.o.   MRN: [id number]  HPI Pt returns for f/u of diabetes mellitus: DM type: Insulin-requiring type 2, but he is at risk for evolving type 1.  Dx'ed: 1791 Complications: ESRD, foot ulcers, GP, PAD, and PDR.   Therapy: insulin since 2021 (Omnipod Dash since 1/22), and FL CGM.   DKA: never Severe hypoglycemia: never.  Pancreatitis: never Pancreatic imaging: normal on 2019 CT SDOH: Wife provides most hx, due to pt's poor overall health; he eats meals at 10AM, (sometimes 2PM), and 6PM; He eats small breakfast and lunch, and a larger dinner.  Other: fructosamine converts to higher avg glucose than A1c, prob due to renal failure; he has Omnipod 5 pumps, but ins prefers FL CGM; he stopped Trulicity, due to GP; he eats meals at 10AM, 1PM, and 6PM, but these times vary widely.  he has very long duration of action of insulin.   Interval history:  basal rate of 0.3 units/hr. bolus of 1 unit/6 grams carbohydrate.  However, please add 2 units to whichever meal you eat at 12-2 PM.   correction bolus (which some people call "sensitivity," or "insulin sensitivity ratio," or just "isr") of 1 unit for each 100 by which your glucose exceeds 120.   we are unable to access pump settings.  I reviewed continuous glucose monitor data.  Glucose varies from 115-280.  It decreases 1AM-1PM, then increases until 5PM.  It is then relatively flat until 1AM.  He does not add insulin at lunch, as advised.  TDD is 39 units (18% basal).  He takes 5.4 boluses per day.   Past Medical History:  Diagnosis Date   Asthma    Cancer ([place])    Cataract    CKD (chronic kidney disease), stage III ([place])    Diabetes mellitus    Glaucoma    Vascular insufficiency 05/2020    Past Surgical History:  Procedure Laterality Date   ABDOMINAL AORTOGRAM W/LOWER EXTREMITY N/A 06/19/2020   Procedure: ABDOMINAL AORTOGRAM W/LOWER EXTREMITY;  Surgeon: Waynetta Sandy, MD;  Location: [place] [facility];  Service: Cardiovascular;  Laterality: N/A;   ABDOMINAL AORTOGRAM W/LOWER EXTREMITY Left 10/16/2020   Procedure: ABDOMINAL AORTOGRAM W/LOWER EXTREMITY;  Surgeon: Waynetta Sandy, MD;  Location: [place] [facility];  Service: Cardiovascular;  Laterality: Left;   ABDOMINAL AORTOGRAM W/LOWER EXTREMITY N/A 12/25/2020   Procedure: ABDOMINAL AORTOGRAM W/LOWER EXTREMITY;  Surgeon: Waynetta Sandy, MD;  Location: [place] [facility];  Service: Cardiovascular;  Laterality: N/A;   AMPUTATION Right 01/12/2021   Procedure: 1ST AMPUTATION RAY;  Surgeon: [name], DPM;  Location: [place];  Service: Podiatry;  Laterality: Right;   AMPUTATION Right 03/13/2021   Procedure: RIGHT BELOW KNEE AMPUTATION;  Surgeon: Waynetta Sandy, MD;  Location: [place];  Service: Vascular;  Laterality: Right;   AV FISTULA PLACEMENT Right 09/14/2019   Procedure: Right Arm Basilic Vein transposition;  Surgeon: Angelia Mould, MD;  Location: [place];  Service: Vascular;  Laterality: Right;   BONE BIOPSY Left 07/29/2020   Procedure: BONE BIOPSY;  Surgeon: [name], DPM;  Location: [place];  Service: Podiatry;  Laterality: Left;  Need bone trephines and/or large bore Giamshidi   COLONOSCOPY     COLONOSCOPY WITH PROPOFOL N/A 12/07/2021   Procedure: COLONOSCOPY WITH PROPOFOL;  Surgeon: Carol Ada, MD;  Location: [facility];  Service: Endoscopy;  Laterality: N/A;   PERIPHERAL  VASCULAR ATHERECTOMY Left 06/19/2020   Procedure: PERIPHERAL VASCULAR ATHERECTOMY;  Surgeon: Waynetta Sandy, MD;  Location: [place] [facility];  Service: Cardiovascular;  Laterality: Left;  SFA   PERIPHERAL VASCULAR ATHERECTOMY  12/25/2020   Procedure: PERIPHERAL VASCULAR ATHERECTOMY;  Surgeon: Waynetta Sandy, MD;  Location: [place] [facility];  Service: Cardiovascular;;  Lt. PT - Laser Lt. SFA - Laser   PERIPHERAL VASCULAR BALLOON ANGIOPLASTY  12/25/2020    Procedure: PERIPHERAL VASCULAR BALLOON ANGIOPLASTY;  Surgeon: Waynetta Sandy, MD;  Location: [place] [facility];  Service: Cardiovascular;;  Lt. SFA and PT   POLYPECTOMY  12/07/2021   Procedure: POLYPECTOMY;  Surgeon: Carol Ada, MD;  Location: [facility];  Service: Endoscopy;;   UPPER GASTROINTESTINAL ENDOSCOPY  02/2019   Dr Benson [place]      Social History   Socioeconomic History   Marital status: Married    Spouse name: Not on file   Number of children: Not on file   Years of education: Not on file   Highest education level: Not on file  Occupational History   Occupation: Chief Financial Officer  Tobacco Use   Smoking status: Every Day    Packs/day: 0.30    Types: Cigarettes   Smokeless tobacco: Never  Vaping Use   Vaping Use: Never used  Substance and Sexual Activity   Alcohol use: No    Alcohol/week: 0.0 standard drinks   Drug use: No   Sexual activity: Not on file  Other Topics Concern   Not on file  Social History Narrative   Married.    Social Determinants of Health   Financial Resource Strain: Not on file  Food Insecurity: Not on file  Transportation Needs: Not on file  Physical Activity: Not on file  Stress: Not on file  Social Connections: Not on file  Intimate Partner Violence: Not on file    Current Outpatient Medications on File Prior to Visit  Medication Sig Dispense Refill   aspirin EC 81 MG EC tablet Take 1 tablet (81 mg total) by mouth daily. Swallow whole. (Patient taking differently: Take 81 mg by mouth every morning. Swallow whole.) 30 tablet 11   clopidogrel (PLAVIX) 75 MG tablet Take 1 tablet (75 mg total) by mouth daily with breakfast. (Patient taking differently: Take 75 mg by mouth every morning.) 30 tablet 3   Continuous Blood Gluc Receiver (DEXCOM G6 RECEIVER) DEVI Use as Directed 1 each 0   Continuous Blood Gluc Sensor (DEXCOM G6 SENSOR) MISC 1 Device by Does not apply route See admin instructions. Change every 10 days 9 each 3   Continuous  Blood Gluc Transmit (DEXCOM G6 TRANSMITTER) MISC Change every 3 mos 1 each 3   docusate sodium (COLACE) 100 MG capsule Take 100 mg by mouth every morning.     Glucagon (GVOKE HYPOPEN 1-PACK) 1 MG/0.2ML SOAJ Inject 1 mg into the skin once as needed (low blood sugar).     Insulin Aspart, w/Niacinamide, (FIASP) 100 UNIT/ML SOLN 90 Units See admin instructions. Uses in Insulin Pump     Insulin Disposable Pump (OMNIPOD 5 G6 POD, GEN 5,) MISC 1 Device by Does not apply route every 3 (three) days. 30 each 3   insulin lispro (HUMALOG) 100 UNIT/ML injection For use in pump, total of 60 units per day 60 mL 3   iron sucrose in sodium chloride 0.9 % 100 mL Iron Sucrose (Venofer) Self Administer at Home     Methoxy PEG-Epoetin Beta (MIRCERA IJ) Inject into the skin.  multivitamin (RENA-VIT) TABS tablet Take 1 tablet by mouth every morning.     pantoprazole (PROTONIX) 40 MG tablet Take 1 tablet (40 mg total) by mouth daily.     rosuvastatin (CRESTOR) 10 MG tablet TAKE 1 TABLET BY MOUTH DAILY (Patient taking differently: Take 10 mg by mouth every morning.) 30 tablet 11   No current facility-administered medications on file prior to visit.    Allergies  Allergen Reactions   Amoxicillin-Pot Clavulanate Diarrhea   Midodrine Other (See Comments)    Other reaction(s): Urinary Sensation   Tape Other (See Comments)    Latex band aids cause blistering    Family History  Problem Relation Age of Onset   Cancer Father    Diabetes Mother     BP (!) 80/60    Pulse 97    Ht 5\' 9"  (1.753 m)    Wt 179 lb 12.8 oz (81.6 kg)    SpO2 99%    BMI 26.55 kg/m   Review of Systems He denies hypoglycemia.      Objective:   Physical Exam    Lab Results  Component Value Date   HGBA1C 7.1 (A) 01/29/2022      Assessment & Plan:  Insulin-requiring type 2 DM: uncontrolled.  The pattern of his cbg's indicates he needs some adjustment in his therapy  Patient Instructions  check your blood sugar 4 times a day:  before the 3 meals, and at bedtime.  also check if you have symptoms of your blood sugar being too high or too low.  please keep a record of the readings and bring it to your next appointment here (or you can bring the meter itself).  You can write it on any piece of paper.  please call us sooner if your blood sugar goes below 70, or if you have a lot of readings over 200.  Please take these settings:  basal rate of 0.3 units/hr. bolus of 1 unit/6 grams carbohydrate.  However, please subtract 2 units from you calculated breakfast bolus, and add 2 units to whichever meal you eat at 12-2 PM.  correction bolus (which some people call "sensitivity," or "insulin sensitivity ratio," or just "isr") of 1 unit for each 110 by which your glucose exceeds 120.    Please come back for a follow-up appointment in 2-3 months.

## 2022-01-29 NOTE — Patient Instructions (Addendum)
check your blood sugar 4 times a day: before the 3 meals, and at bedtime.  also check if you have symptoms of your blood sugar being too high or too low.  please keep a record of the readings and bring it to your next appointment here (or you can bring the meter itself).  You can write it on any piece of paper.  please call us sooner if your blood sugar goes below 70, or if you have a lot of readings over 200.  Please take these settings:  basal rate of 0.3 units/hr. bolus of 1 unit/6 grams carbohydrate.  However, please subtract 2 units from you calculated breakfast bolus, and add 2 units to whichever meal you eat at 12-2 PM.  correction bolus (which some people call "sensitivity," or "insulin sensitivity ratio," or just "isr") of 1 unit for each 110 by which your glucose exceeds 120.    Please come back for a follow-up appointment in 2-3 months.

## 2022-02-20 ENCOUNTER — Other Ambulatory Visit: Payer: Self-pay

## 2022-02-20 ENCOUNTER — Ambulatory Visit: Payer: BC Managed Care – PPO | Admitting: Podiatry

## 2022-02-20 ENCOUNTER — Encounter: Payer: Self-pay | Admitting: Endocrinology

## 2022-02-20 DIAGNOSIS — T148XXA Other injury of unspecified body region, initial encounter: Secondary | ICD-10-CM | POA: Diagnosis not present

## 2022-02-20 DIAGNOSIS — Z89411 Acquired absence of right great toe: Secondary | ICD-10-CM | POA: Diagnosis not present

## 2022-02-20 DIAGNOSIS — E1169 Type 2 diabetes mellitus with other specified complication: Secondary | ICD-10-CM

## 2022-02-20 DIAGNOSIS — I739 Peripheral vascular disease, unspecified: Secondary | ICD-10-CM

## 2022-02-20 DIAGNOSIS — N186 End stage renal disease: Secondary | ICD-10-CM | POA: Diagnosis not present

## 2022-02-21 NOTE — Progress Notes (Signed)
°  Subjective:  Patient ID: Duane Hall, male    DOB: 1956-12-28,  MRN: [id number]  Chief Complaint  Patient presents with   Diabetic Ulcer     diabetic with left great toe possible ulcer    66 y.o. male presents with the above complaint. History confirmed with patient.  Returns today with new issue.  Things are doing okay with his right side he has a prosthetic for his above-knee amputation, says he has issues with balance but still gets around.  He and his wife are concerned that the left side developed a blood blister on the great toe and wanted to be checked out.  Objective:  Physical Exam: Small blood blister without signs of infection, drainage malodor purulence or cellulitis to the distal lateral hallux.  There is hypertrophy and thickening of the nail plates x5 on the left foot.  Pulses are nonpalpable he has some edema.  Foot is warm and well-perfused Assessment:   1. Blood blister   2. History of partial ray amputation of first toe of right foot ([place])   3. Type 2 diabetes mellitus with other specified complication, without long-term current use of insulin ([place])   4. ESRD (end stage renal disease) ([place])   5. PAD (peripheral artery disease) ([place])      Plan:  Patient was evaluated and treated and all questions answered.  Today I drained the hemorrhagic blister today and applied a iodine ointment dressing.  He will do this at home with Neosporin and antibiotic ointment.  Do not expect this to be an issue for him but I will see him back in 3 weeks for reevaluation.  I think the causative agent was the second toenail rubbing on the big toe.  I debrided the nails in length and thickness using sharp nail nipper.  May need to see him for regular high risk foot care  Return in about 3 weeks (around 03/13/2022) for wound check .

## 2022-02-27 DIAGNOSIS — N2581 Secondary hyperparathyroidism of renal origin: Secondary | ICD-10-CM | POA: Diagnosis not present

## 2022-02-27 DIAGNOSIS — Z992 Dependence on renal dialysis: Secondary | ICD-10-CM | POA: Diagnosis not present

## 2022-02-27 DIAGNOSIS — N186 End stage renal disease: Secondary | ICD-10-CM | POA: Diagnosis not present

## 2022-02-28 DIAGNOSIS — N186 End stage renal disease: Secondary | ICD-10-CM | POA: Diagnosis not present

## 2022-02-28 DIAGNOSIS — N2581 Secondary hyperparathyroidism of renal origin: Secondary | ICD-10-CM | POA: Diagnosis not present

## 2022-02-28 DIAGNOSIS — Z992 Dependence on renal dialysis: Secondary | ICD-10-CM | POA: Diagnosis not present

## 2022-03-03 DIAGNOSIS — N2581 Secondary hyperparathyroidism of renal origin: Secondary | ICD-10-CM | POA: Diagnosis not present

## 2022-03-03 DIAGNOSIS — N186 End stage renal disease: Secondary | ICD-10-CM | POA: Diagnosis not present

## 2022-03-03 DIAGNOSIS — Z992 Dependence on renal dialysis: Secondary | ICD-10-CM | POA: Diagnosis not present

## 2022-03-04 ENCOUNTER — Encounter: Payer: Self-pay | Admitting: Endocrinology

## 2022-03-04 DIAGNOSIS — E1122 Type 2 diabetes mellitus with diabetic chronic kidney disease: Secondary | ICD-10-CM

## 2022-03-04 DIAGNOSIS — N2581 Secondary hyperparathyroidism of renal origin: Secondary | ICD-10-CM | POA: Diagnosis not present

## 2022-03-04 DIAGNOSIS — N186 End stage renal disease: Secondary | ICD-10-CM | POA: Diagnosis not present

## 2022-03-04 DIAGNOSIS — Z992 Dependence on renal dialysis: Secondary | ICD-10-CM | POA: Diagnosis not present

## 2022-03-05 ENCOUNTER — Telehealth: Payer: Self-pay

## 2022-03-05 ENCOUNTER — Telehealth: Payer: Self-pay | Admitting: Nutrition

## 2022-03-05 ENCOUNTER — Other Ambulatory Visit (HOSPITAL_COMMUNITY): Payer: Self-pay

## 2022-03-05 MED ORDER — OMNIPOD 5 DEXG7G6 PODS GEN 5 MISC: 1.0000 | 3 refills | Status: DC

## 2022-03-05 NOTE — Telephone Encounter (Signed)
Duane Hall reports that they linked put the pump in the automated mode a week ago, and I have linked him to this practice.  Download shows patient is 95% in target range.  This was put on Duane Hall desk. ?

## 2022-03-05 NOTE — Telephone Encounter (Signed)
Patient Advocate Encounter ?  ?Received notification from [facility] that prior authorization for Omnipod 5 G6 pods is required by his/her insurance [employer]. ?  ?PA submitted on 03/05/22 ? ?Key#: [id number] ? ?Status is pending ?   ?[facility] will continue to follow: ? ?Patient Advocate ?Fax: [phone]  ?

## 2022-03-06 DIAGNOSIS — N186 End stage renal disease: Secondary | ICD-10-CM | POA: Diagnosis not present

## 2022-03-06 DIAGNOSIS — Z992 Dependence on renal dialysis: Secondary | ICD-10-CM | POA: Diagnosis not present

## 2022-03-06 DIAGNOSIS — N2581 Secondary hyperparathyroidism of renal origin: Secondary | ICD-10-CM | POA: Diagnosis not present

## 2022-03-07 DIAGNOSIS — N2581 Secondary hyperparathyroidism of renal origin: Secondary | ICD-10-CM | POA: Diagnosis not present

## 2022-03-07 DIAGNOSIS — N186 End stage renal disease: Secondary | ICD-10-CM | POA: Diagnosis not present

## 2022-03-07 DIAGNOSIS — Z992 Dependence on renal dialysis: Secondary | ICD-10-CM | POA: Diagnosis not present

## 2022-03-10 DIAGNOSIS — N186 End stage renal disease: Secondary | ICD-10-CM | POA: Diagnosis not present

## 2022-03-10 DIAGNOSIS — Z992 Dependence on renal dialysis: Secondary | ICD-10-CM | POA: Diagnosis not present

## 2022-03-10 DIAGNOSIS — N2581 Secondary hyperparathyroidism of renal origin: Secondary | ICD-10-CM | POA: Diagnosis not present

## 2022-03-11 ENCOUNTER — Other Ambulatory Visit (HOSPITAL_COMMUNITY): Payer: Self-pay

## 2022-03-11 DIAGNOSIS — N2581 Secondary hyperparathyroidism of renal origin: Secondary | ICD-10-CM | POA: Diagnosis not present

## 2022-03-11 DIAGNOSIS — Z992 Dependence on renal dialysis: Secondary | ICD-10-CM | POA: Diagnosis not present

## 2022-03-11 DIAGNOSIS — N186 End stage renal disease: Secondary | ICD-10-CM | POA: Diagnosis not present

## 2022-03-11 NOTE — Telephone Encounter (Signed)
Patient Advocate Encounter ? ?Prior Authorization for Omnipod 5 G6 Pod has been approved.   ? ?PA# [id number] ? ?Effective dates: 03/05/22 through 03/06/23 ? ?Per Test Claim Patients co-pay is $30.  ? ?Spoke with Pharmacy to Process. ? ?Patient Advocate ?Fax: [phone]  ?

## 2022-03-13 DIAGNOSIS — K529 Noninfective gastroenteritis and colitis, unspecified: Secondary | ICD-10-CM | POA: Diagnosis not present

## 2022-03-13 DIAGNOSIS — Z992 Dependence on renal dialysis: Secondary | ICD-10-CM | POA: Diagnosis not present

## 2022-03-13 DIAGNOSIS — N186 End stage renal disease: Secondary | ICD-10-CM | POA: Diagnosis not present

## 2022-03-13 DIAGNOSIS — N2581 Secondary hyperparathyroidism of renal origin: Secondary | ICD-10-CM | POA: Diagnosis not present

## 2022-03-14 DIAGNOSIS — N186 End stage renal disease: Secondary | ICD-10-CM | POA: Diagnosis not present

## 2022-03-14 DIAGNOSIS — N2581 Secondary hyperparathyroidism of renal origin: Secondary | ICD-10-CM | POA: Diagnosis not present

## 2022-03-14 DIAGNOSIS — Z992 Dependence on renal dialysis: Secondary | ICD-10-CM | POA: Diagnosis not present

## 2022-03-17 DIAGNOSIS — N2581 Secondary hyperparathyroidism of renal origin: Secondary | ICD-10-CM | POA: Diagnosis not present

## 2022-03-17 DIAGNOSIS — Z992 Dependence on renal dialysis: Secondary | ICD-10-CM | POA: Diagnosis not present

## 2022-03-17 DIAGNOSIS — N186 End stage renal disease: Secondary | ICD-10-CM | POA: Diagnosis not present

## 2022-03-18 DIAGNOSIS — N186 End stage renal disease: Secondary | ICD-10-CM | POA: Diagnosis not present

## 2022-03-18 DIAGNOSIS — N2581 Secondary hyperparathyroidism of renal origin: Secondary | ICD-10-CM | POA: Diagnosis not present

## 2022-03-18 DIAGNOSIS — Z992 Dependence on renal dialysis: Secondary | ICD-10-CM | POA: Diagnosis not present

## 2022-03-19 ENCOUNTER — Other Ambulatory Visit: Payer: Self-pay

## 2022-03-19 ENCOUNTER — Ambulatory Visit: Payer: BC Managed Care – PPO | Admitting: Podiatry

## 2022-03-19 DIAGNOSIS — E1169 Type 2 diabetes mellitus with other specified complication: Secondary | ICD-10-CM

## 2022-03-19 DIAGNOSIS — Z8631 Personal history of diabetic foot ulcer: Secondary | ICD-10-CM | POA: Diagnosis not present

## 2022-03-19 DIAGNOSIS — T148XXA Other injury of unspecified body region, initial encounter: Secondary | ICD-10-CM

## 2022-03-19 DIAGNOSIS — I739 Peripheral vascular disease, unspecified: Secondary | ICD-10-CM

## 2022-03-19 NOTE — Patient Instructions (Addendum)
Engineer, maintenance (IT) is a good place for shoes ? ?[address], [place], [place] [address] ?

## 2022-03-20 DIAGNOSIS — N2581 Secondary hyperparathyroidism of renal origin: Secondary | ICD-10-CM | POA: Diagnosis not present

## 2022-03-20 DIAGNOSIS — N186 End stage renal disease: Secondary | ICD-10-CM | POA: Diagnosis not present

## 2022-03-20 DIAGNOSIS — Z992 Dependence on renal dialysis: Secondary | ICD-10-CM | POA: Diagnosis not present

## 2022-03-21 DIAGNOSIS — Z992 Dependence on renal dialysis: Secondary | ICD-10-CM | POA: Diagnosis not present

## 2022-03-21 DIAGNOSIS — N2581 Secondary hyperparathyroidism of renal origin: Secondary | ICD-10-CM | POA: Diagnosis not present

## 2022-03-21 DIAGNOSIS — N186 End stage renal disease: Secondary | ICD-10-CM | POA: Diagnosis not present

## 2022-03-22 NOTE — Progress Notes (Signed)
?  Subjective:  ?Patient ID: Duane Hall, male    DOB: 01/01/1956,  MRN: [id number] ? ?Chief Complaint  ?Patient presents with  ? Foot Ulcer  ?   WOUND CARE, right foot ulcer  ? ? ?66 y.o. male presents with the above complaint. History confirmed with patient.  Today he returns for follow-up for the blood blister on the left great toe feels he is doing quite a bit better ? ?Objective:  ?Physical Exam: ?Small blood blister without signs of infection, drainage malodor purulence or cellulitis to the distal lateral hallux.  There is hypertrophy and thickening of the nail plates x5 on the left foot.  Pulses are nonpalpable he has some edema.  Foot is warm and well-perfused ?Assessment:  ? ?1. Blood blister   ?2. Type 2 diabetes mellitus with other specified complication, without long-term current use of insulin ([place])   ?3. History of diabetic ulcer of foot   ?4. PAD (peripheral artery disease) ([place])   ? ? ? ?Plan:  ?Patient was evaluated and treated and all questions answered. ? ?Blood blister has fully healed I advised him to continue to monitor the feet and inspect them daily.  I do think he may benefit from extra-depth diabetic shoes and I will discuss with our office this is something he is able to do.  I will see him back in 2 half months for at risk diabetic foot care. ? ?Return in about 11 weeks (around 06/04/2022) for at risk diabetic foot care.  ? ?

## 2022-03-24 DIAGNOSIS — Z992 Dependence on renal dialysis: Secondary | ICD-10-CM | POA: Diagnosis not present

## 2022-03-24 DIAGNOSIS — N2581 Secondary hyperparathyroidism of renal origin: Secondary | ICD-10-CM | POA: Diagnosis not present

## 2022-03-24 DIAGNOSIS — N186 End stage renal disease: Secondary | ICD-10-CM | POA: Diagnosis not present

## 2022-03-25 DIAGNOSIS — N2581 Secondary hyperparathyroidism of renal origin: Secondary | ICD-10-CM | POA: Diagnosis not present

## 2022-03-25 DIAGNOSIS — Z992 Dependence on renal dialysis: Secondary | ICD-10-CM | POA: Diagnosis not present

## 2022-03-25 DIAGNOSIS — N186 End stage renal disease: Secondary | ICD-10-CM | POA: Diagnosis not present

## 2022-03-27 DIAGNOSIS — Z992 Dependence on renal dialysis: Secondary | ICD-10-CM | POA: Diagnosis not present

## 2022-03-27 DIAGNOSIS — N186 End stage renal disease: Secondary | ICD-10-CM | POA: Diagnosis not present

## 2022-03-27 DIAGNOSIS — N2581 Secondary hyperparathyroidism of renal origin: Secondary | ICD-10-CM | POA: Diagnosis not present

## 2022-03-28 DIAGNOSIS — Z992 Dependence on renal dialysis: Secondary | ICD-10-CM | POA: Diagnosis not present

## 2022-03-28 DIAGNOSIS — N186 End stage renal disease: Secondary | ICD-10-CM | POA: Diagnosis not present

## 2022-03-28 DIAGNOSIS — N2581 Secondary hyperparathyroidism of renal origin: Secondary | ICD-10-CM | POA: Diagnosis not present

## 2022-03-29 DIAGNOSIS — Z992 Dependence on renal dialysis: Secondary | ICD-10-CM | POA: Diagnosis not present

## 2022-03-29 DIAGNOSIS — N186 End stage renal disease: Secondary | ICD-10-CM | POA: Diagnosis not present

## 2022-03-29 DIAGNOSIS — E1129 Type 2 diabetes mellitus with other diabetic kidney complication: Secondary | ICD-10-CM | POA: Diagnosis not present

## 2022-03-31 DIAGNOSIS — Z992 Dependence on renal dialysis: Secondary | ICD-10-CM | POA: Diagnosis not present

## 2022-03-31 DIAGNOSIS — N2581 Secondary hyperparathyroidism of renal origin: Secondary | ICD-10-CM | POA: Diagnosis not present

## 2022-03-31 DIAGNOSIS — N186 End stage renal disease: Secondary | ICD-10-CM | POA: Diagnosis not present

## 2022-04-01 DIAGNOSIS — N2581 Secondary hyperparathyroidism of renal origin: Secondary | ICD-10-CM | POA: Diagnosis not present

## 2022-04-01 DIAGNOSIS — N186 End stage renal disease: Secondary | ICD-10-CM | POA: Diagnosis not present

## 2022-04-01 DIAGNOSIS — Z992 Dependence on renal dialysis: Secondary | ICD-10-CM | POA: Diagnosis not present

## 2022-04-03 DIAGNOSIS — N186 End stage renal disease: Secondary | ICD-10-CM | POA: Diagnosis not present

## 2022-04-03 DIAGNOSIS — Z992 Dependence on renal dialysis: Secondary | ICD-10-CM | POA: Diagnosis not present

## 2022-04-03 DIAGNOSIS — N2581 Secondary hyperparathyroidism of renal origin: Secondary | ICD-10-CM | POA: Diagnosis not present

## 2022-04-04 DIAGNOSIS — Z992 Dependence on renal dialysis: Secondary | ICD-10-CM | POA: Diagnosis not present

## 2022-04-04 DIAGNOSIS — N186 End stage renal disease: Secondary | ICD-10-CM | POA: Diagnosis not present

## 2022-04-04 DIAGNOSIS — N2581 Secondary hyperparathyroidism of renal origin: Secondary | ICD-10-CM | POA: Diagnosis not present

## 2022-04-07 DIAGNOSIS — N186 End stage renal disease: Secondary | ICD-10-CM | POA: Diagnosis not present

## 2022-04-07 DIAGNOSIS — Z992 Dependence on renal dialysis: Secondary | ICD-10-CM | POA: Diagnosis not present

## 2022-04-07 DIAGNOSIS — N2581 Secondary hyperparathyroidism of renal origin: Secondary | ICD-10-CM | POA: Diagnosis not present

## 2022-04-08 DIAGNOSIS — Z992 Dependence on renal dialysis: Secondary | ICD-10-CM | POA: Diagnosis not present

## 2022-04-08 DIAGNOSIS — N186 End stage renal disease: Secondary | ICD-10-CM | POA: Diagnosis not present

## 2022-04-08 DIAGNOSIS — N2581 Secondary hyperparathyroidism of renal origin: Secondary | ICD-10-CM | POA: Diagnosis not present

## 2022-04-10 DIAGNOSIS — Z992 Dependence on renal dialysis: Secondary | ICD-10-CM | POA: Diagnosis not present

## 2022-04-10 DIAGNOSIS — N186 End stage renal disease: Secondary | ICD-10-CM | POA: Diagnosis not present

## 2022-04-10 DIAGNOSIS — N2581 Secondary hyperparathyroidism of renal origin: Secondary | ICD-10-CM | POA: Diagnosis not present

## 2022-04-11 DIAGNOSIS — N2581 Secondary hyperparathyroidism of renal origin: Secondary | ICD-10-CM | POA: Diagnosis not present

## 2022-04-11 DIAGNOSIS — N186 End stage renal disease: Secondary | ICD-10-CM | POA: Diagnosis not present

## 2022-04-11 DIAGNOSIS — Z992 Dependence on renal dialysis: Secondary | ICD-10-CM | POA: Diagnosis not present

## 2022-04-14 DIAGNOSIS — N186 End stage renal disease: Secondary | ICD-10-CM | POA: Diagnosis not present

## 2022-04-14 DIAGNOSIS — Z992 Dependence on renal dialysis: Secondary | ICD-10-CM | POA: Diagnosis not present

## 2022-04-14 DIAGNOSIS — N2581 Secondary hyperparathyroidism of renal origin: Secondary | ICD-10-CM | POA: Diagnosis not present

## 2022-04-15 DIAGNOSIS — N186 End stage renal disease: Secondary | ICD-10-CM | POA: Diagnosis not present

## 2022-04-15 DIAGNOSIS — Z992 Dependence on renal dialysis: Secondary | ICD-10-CM | POA: Diagnosis not present

## 2022-04-15 DIAGNOSIS — N2581 Secondary hyperparathyroidism of renal origin: Secondary | ICD-10-CM | POA: Diagnosis not present

## 2022-04-17 DIAGNOSIS — N2581 Secondary hyperparathyroidism of renal origin: Secondary | ICD-10-CM | POA: Diagnosis not present

## 2022-04-17 DIAGNOSIS — N186 End stage renal disease: Secondary | ICD-10-CM | POA: Diagnosis not present

## 2022-04-17 DIAGNOSIS — Z992 Dependence on renal dialysis: Secondary | ICD-10-CM | POA: Diagnosis not present

## 2022-04-18 DIAGNOSIS — Z992 Dependence on renal dialysis: Secondary | ICD-10-CM | POA: Diagnosis not present

## 2022-04-18 DIAGNOSIS — N186 End stage renal disease: Secondary | ICD-10-CM | POA: Diagnosis not present

## 2022-04-18 DIAGNOSIS — N2581 Secondary hyperparathyroidism of renal origin: Secondary | ICD-10-CM | POA: Diagnosis not present

## 2022-04-21 DIAGNOSIS — N186 End stage renal disease: Secondary | ICD-10-CM | POA: Diagnosis not present

## 2022-04-21 DIAGNOSIS — N2581 Secondary hyperparathyroidism of renal origin: Secondary | ICD-10-CM | POA: Diagnosis not present

## 2022-04-21 DIAGNOSIS — Z992 Dependence on renal dialysis: Secondary | ICD-10-CM | POA: Diagnosis not present

## 2022-04-22 DIAGNOSIS — T82898A Other specified complication of vascular prosthetic devices, implants and grafts, initial encounter: Secondary | ICD-10-CM | POA: Diagnosis not present

## 2022-04-22 DIAGNOSIS — N186 End stage renal disease: Secondary | ICD-10-CM | POA: Diagnosis not present

## 2022-04-22 DIAGNOSIS — N2581 Secondary hyperparathyroidism of renal origin: Secondary | ICD-10-CM | POA: Diagnosis not present

## 2022-04-22 DIAGNOSIS — Z992 Dependence on renal dialysis: Secondary | ICD-10-CM | POA: Diagnosis not present

## 2022-04-22 DIAGNOSIS — I871 Compression of vein: Secondary | ICD-10-CM | POA: Diagnosis not present

## 2022-04-24 DIAGNOSIS — Z992 Dependence on renal dialysis: Secondary | ICD-10-CM | POA: Diagnosis not present

## 2022-04-24 DIAGNOSIS — N186 End stage renal disease: Secondary | ICD-10-CM | POA: Diagnosis not present

## 2022-04-24 DIAGNOSIS — N2581 Secondary hyperparathyroidism of renal origin: Secondary | ICD-10-CM | POA: Diagnosis not present

## 2022-04-25 DIAGNOSIS — Z992 Dependence on renal dialysis: Secondary | ICD-10-CM | POA: Diagnosis not present

## 2022-04-25 DIAGNOSIS — N2581 Secondary hyperparathyroidism of renal origin: Secondary | ICD-10-CM | POA: Diagnosis not present

## 2022-04-25 DIAGNOSIS — N186 End stage renal disease: Secondary | ICD-10-CM | POA: Diagnosis not present

## 2022-04-28 DIAGNOSIS — Z992 Dependence on renal dialysis: Secondary | ICD-10-CM | POA: Diagnosis not present

## 2022-04-28 DIAGNOSIS — N2581 Secondary hyperparathyroidism of renal origin: Secondary | ICD-10-CM | POA: Diagnosis not present

## 2022-04-28 DIAGNOSIS — E1129 Type 2 diabetes mellitus with other diabetic kidney complication: Secondary | ICD-10-CM | POA: Diagnosis not present

## 2022-04-28 DIAGNOSIS — N186 End stage renal disease: Secondary | ICD-10-CM | POA: Diagnosis not present

## 2022-04-29 DIAGNOSIS — N2581 Secondary hyperparathyroidism of renal origin: Secondary | ICD-10-CM | POA: Diagnosis not present

## 2022-04-29 DIAGNOSIS — Z992 Dependence on renal dialysis: Secondary | ICD-10-CM | POA: Diagnosis not present

## 2022-04-29 DIAGNOSIS — N186 End stage renal disease: Secondary | ICD-10-CM | POA: Diagnosis not present

## 2022-04-30 ENCOUNTER — Encounter: Payer: Self-pay | Admitting: Endocrinology

## 2022-04-30 ENCOUNTER — Ambulatory Visit (INDEPENDENT_AMBULATORY_CARE_PROVIDER_SITE_OTHER): Payer: BC Managed Care – PPO | Admitting: Endocrinology

## 2022-04-30 VITALS — BP 98/62 | HR 91 | Ht 69.0 in | Wt 189.4 lb

## 2022-04-30 DIAGNOSIS — Z992 Dependence on renal dialysis: Secondary | ICD-10-CM

## 2022-04-30 DIAGNOSIS — N186 End stage renal disease: Secondary | ICD-10-CM

## 2022-04-30 DIAGNOSIS — E1122 Type 2 diabetes mellitus with diabetic chronic kidney disease: Secondary | ICD-10-CM | POA: Diagnosis not present

## 2022-04-30 LAB — POCT GLYCOSYLATED HEMOGLOBIN (HGB A1C): Hemoglobin A1C: 5.6 % (ref 4.0–5.6)

## 2022-04-30 NOTE — Progress Notes (Signed)
? ?Subjective:  ? ? Patient ID: Duane Hall, male    DOB: 1956/05/18, 66 y.o.   MRN: [id number] ? ?HPI ?Pt returns for f/u of diabetes mellitus: ?DM type: Insulin-requiring type 2, but he is at risk for evolving type 1.  ?Dx'ed: 2005 ?Complications: ESRD, foot ulcers, GP, PAD, and PDR.   ?Therapy: insulin since 2021 (Omnipod Dash since 1/22), and Dexcom CGM.   ?DKA: never ?Severe hypoglycemia: never.  ?Pancreatitis: never ?Pancreatic imaging: normal on 2019 CT ?SDOH: Wife provides most hx, due to pt's poor overall health; he eats meals at 10AM, (sometimes 2PM), and 6PM; He eats small breakfast and lunch, and a larger dinner.  ?Other: fructosamine converts to higher avg glucose than A1c, prob due to renal failure; he has Omnipod 5 pumps, but ins prefers [place] CGM; he stopped Trulicity, due to GP; he eats meals at 10AM, 1PM, and 6PM, but these times vary widely.  he has very long duration of action of insulin.   ?Interval history: pt is alone today. ?basal rate of 0.3 units/hr (except 0.15 units/HR 9PM-9AM) ?bolus of 1 unit/6 grams carbohydrate.  ?correction bolus (which some people call "sensitivity," or "insulin sensitivity ratio," or just "isr") of 1 unit for each 100 by which your glucose exceeds 110.  ?I reviewed continuous glucose monitor data.  Glucose varies from 70-240.  It increases 8AM-8PM, then decreases until 6AM.  It is then relatively flat until 8AM. TDD is 41 units (51% basal).  He takes 4 boluses per day.   ?He has mild hypoglycemia approx 2/month.  This happens in the middle of the night.   ?Past Medical History:  ?Diagnosis Date  ? Asthma   ? Cancer [facility])   ? Cataract   ? CKD (chronic kidney disease), stage III ([place])   ? Diabetes mellitus   ? Glaucoma   ? Vascular insufficiency 05/2020  ? ? ?Past Surgical History:  ?Procedure Laterality Date  ? ABDOMINAL AORTOGRAM W/LOWER EXTREMITY N/A 06/19/2020  ? Procedure: ABDOMINAL AORTOGRAM W/LOWER EXTREMITY;  Surgeon: Waynetta Sandy, MD;  Location:  [facility];  Service: Cardiovascular;  Laterality: N/A;  ? ABDOMINAL AORTOGRAM W/LOWER EXTREMITY Left 10/16/2020  ? Procedure: ABDOMINAL AORTOGRAM W/LOWER EXTREMITY;  Surgeon: Waynetta Sandy, MD;  Location: [facility];  Service: Cardiovascular;  Laterality: Left;  ? ABDOMINAL AORTOGRAM W/LOWER EXTREMITY N/A 12/25/2020  ? Procedure: ABDOMINAL AORTOGRAM W/LOWER EXTREMITY;  Surgeon: Waynetta Sandy, MD;  Location: [facility];  Service: Cardiovascular;  Laterality: N/A;  ? AMPUTATION Right 01/12/2021  ? Procedure: 1ST AMPUTATION RAY;  Surgeon: [name], DPM;  Location: [facility];  Service: Podiatry;  Laterality: Right;  ? AMPUTATION Right 03/13/2021  ? Procedure: RIGHT BELOW KNEE AMPUTATION;  Surgeon: Waynetta Sandy, MD;  Location: [place];  Service: Vascular;  Laterality: Right;  ? AV FISTULA PLACEMENT Right 09/14/2019  ? Procedure: Right Arm Basilic Vein transposition;  Surgeon: Angelia Mould, MD;  Location: [place];  Service: Vascular;  Laterality: Right;  ? BONE BIOPSY Left 07/29/2020  ? Procedure: BONE BIOPSY;  Surgeon: [name], DPM;  Location: [facility];  Service: Podiatry;  Laterality: Left;  Need bone trephines and/or large bore Giamshidi  ? COLONOSCOPY    ? COLONOSCOPY WITH PROPOFOL N/A 12/07/2021  ? Procedure: COLONOSCOPY WITH PROPOFOL;  Surgeon: Carol Ada, MD;  [facility];  Service: Endoscopy;  Laterality: N/A;  ? PERIPHERAL VASCULAR ATHERECTOMY Left 06/19/2020  ? Procedure: PERIPHERAL VASCULAR ATHERECTOMY;  Surgeon: Waynetta Sandy, MD;  Location: [facility] [facility];  Service: Cardiovascular;  Laterality: Left;  SFA  ? PERIPHERAL VASCULAR ATHERECTOMY  12/25/2020  ? Procedure: PERIPHERAL VASCULAR ATHERECTOMY;  Surgeon: Waynetta Sandy, MD;  Location: [facility];  Service: Cardiovascular;;  Lt. PT - Laser ?Lt. SFA - Laser  ? PERIPHERAL VASCULAR BALLOON ANGIOPLASTY  12/25/2020  ? Procedure: PERIPHERAL  VASCULAR BALLOON ANGIOPLASTY;  Surgeon: Waynetta Sandy, MD;  Location: [facility] [facility];  Service: Cardiovascular;;  Lt. SFA and PT  ? POLYPECTOMY  12/07/2021  ? Procedure: POLYPECTOMY;  Surgeon: Carol Ada, MD;  [facility];  Service: Endoscopy;;  ? UPPER GASTROINTESTINAL ENDOSCOPY  02/2019  ? Dr Benson [place]    ? ? ?Social History  ? ?Socioeconomic History  ? Marital status: Married  ?  Spouse name: Not on file  ? Number of children: Not on file  ? Years of education: Not on file  ? Highest education level: Not on file  ?Occupational History  ? Occupation: Chief Financial Officer  ?Tobacco Use  ? Smoking status: Every Day  ?  Packs/day: 0.30  ?  Types: Cigarettes  ? Smokeless tobacco: Never  ?Vaping Use  ? Vaping Use: Never used  ?Substance and Sexual Activity  ? Alcohol use: No  ?  Alcohol/week: 0.0 standard drinks  ? Drug use: No  ? Sexual activity: Not on file  ?Other Topics Concern  ? Not on file  ?Social History Narrative  ? Married.   ? ?Social Determinants of Health  ? ?Financial Resource Strain: Not on file  ?Food Insecurity: Not on file  ?Transportation Needs: Not on file  ?Physical Activity: Not on file  ?Stress: Not on file  ?Social Connections: Not on file  ?Intimate Partner Violence: Not on file  ? ? ?Current Outpatient Medications on File Prior to Visit  ?Medication Sig Dispense Refill  ? aspirin EC 81 MG EC tablet Take 1 tablet (81 mg total) by mouth daily. Swallow whole. (Patient taking differently: Take 81 mg by mouth every morning. Swallow whole.) 30 tablet 11  ? clopidogrel (PLAVIX) 75 MG tablet Take 1 tablet (75 mg total) by mouth daily with breakfast. (Patient taking differently: Take 75 mg by mouth every morning.) 30 tablet 3  ? Continuous Blood Gluc Receiver (DEXCOM G6 RECEIVER) DEVI Use as Directed 1 each 0  ? Continuous Blood Gluc Sensor (DEXCOM G6 SENSOR) MISC 1 Device by Does not apply route See admin instructions. Change every 10 days 9 each 3  ? Continuous Blood Gluc Transmit  (DEXCOM G6 TRANSMITTER) MISC Change every 3 mos 1 each 3  ? docusate sodium (COLACE) 100 MG capsule Take 100 mg by mouth every morning.    ? Glucagon (GVOKE HYPOPEN 1-PACK) 1 MG/0.2ML SOAJ Inject 1 mg into the skin once as needed (low blood sugar).    ? Insulin Disposable Pump (OMNIPOD 5 G6 POD, GEN 5,) MISC 1 Device by Does not apply route every 3 (three) days. 30 each 3  ? insulin lispro (HUMALOG) 100 UNIT/ML injection For use in pump, total of 60 units per day 60 mL 3  ? iron sucrose in sodium chloride 0.9 % 100 mL Iron Sucrose (Venofer) Self Administer at Home    ? Methoxy PEG-Epoetin Beta (MIRCERA IJ) Inject into the skin.    ? multivitamin (RENA-VIT) TABS tablet Take 1 tablet by mouth every morning.    ? pantoprazole (PROTONIX) 40 MG tablet Take 1 tablet (40 mg total) by mouth  daily.    ? rosuvastatin (CRESTOR) 10 MG tablet TAKE 1 TABLET BY MOUTH DAILY (Patient taking differently: Take 10 mg by mouth every morning.) 30 tablet 11  ? ?No current facility-administered medications on file prior to visit.  ? ? ?Allergies  ?Allergen Reactions  ? Amoxicillin-Pot Clavulanate Diarrhea  ? Midodrine Other (See Comments)  ?  Other reaction(s): Urinary Sensation  ? Tape Other (See Comments)  ?  Latex band aids cause blistering  ? ? ?Family History  ?Problem Relation Age of Onset  ? Cancer Father   ? Diabetes Mother   ? ? ?BP 98/62 (BP Location: Left Arm, Patient Position: Sitting, Cuff Size: Normal)   Pulse 91   Ht '5\' 9"'$  (1.753 m)   Wt 189 lb 6.4 oz (85.9 kg)   SpO2 96%   BMI 27.97 kg/m?  ? ?Review of Systems ? ?   ?Objective:  ? Physical Exam ?VITAL SIGNS:  See vs page ?GENERAL: no distress ? ? ?Lab Results  ?Component Value Date  ? HGBA1C 5.6 04/30/2022  ? ?Lab Results  ?Component Value Date  ? CREATININE 5.10 (H) 12/07/2021  ? BUN 25 (H) 12/07/2021  ? NA 131 (L) 12/07/2021  ? K 3.1 (L) 12/07/2021  ? CL 87 (L) 12/07/2021  ? CO2 29 03/26/2021  ? ?   ?Assessment & Plan:  ?Insulin-requiring type 2 DM:  uncontrolled ?Hypoglycemia, due to insulin: this limits aggressiveness of glycemic control ?ESRD: check fructosamine ? ?Patient Instructions  ?check your blood sugar 4 times a day: before the 3 meals, and at bedtime.  also check if

## 2022-04-30 NOTE — Patient Instructions (Addendum)
check your blood sugar 4 times a day: before the 3 meals, and at bedtime.  also check if you have symptoms of your blood sugar being too high or too low.  please keep a record of the readings and bring it to your next appointment here (or you can bring the meter itself).  You can write it on any piece of paper.  please call us sooner if your blood sugar goes below 70, or if you have a lot of readings over 200.  ?Please take these settings:  ?basal rate of 0.3 units/hr. ?bolus of 1 unit/6 grams carbohydrate.  However, please subtract 2 units from you calculated breakfast bolus, and add 2 units to whichever meal you eat at 12-2 PM.  ?correction bolus (which some people call "sensitivity," or "insulin sensitivity ratio," or just "isr") of 1 unit for each 110 by which your glucose exceeds 120.    ?A different type of diabetes blood test is requested for you again today.  We'll let you know about the results.  If it is low also, we'll reduce the nighttime basal.  If it is high, we'll also increase the meal boluses.   ?You should have an endocrinology follow-up appointment in 3 months.   ? ? ? ?

## 2022-05-01 DIAGNOSIS — Z992 Dependence on renal dialysis: Secondary | ICD-10-CM | POA: Diagnosis not present

## 2022-05-01 DIAGNOSIS — N2581 Secondary hyperparathyroidism of renal origin: Secondary | ICD-10-CM | POA: Diagnosis not present

## 2022-05-01 DIAGNOSIS — N186 End stage renal disease: Secondary | ICD-10-CM | POA: Diagnosis not present

## 2022-05-02 DIAGNOSIS — N186 End stage renal disease: Secondary | ICD-10-CM | POA: Diagnosis not present

## 2022-05-02 DIAGNOSIS — N2581 Secondary hyperparathyroidism of renal origin: Secondary | ICD-10-CM | POA: Diagnosis not present

## 2022-05-02 DIAGNOSIS — Z992 Dependence on renal dialysis: Secondary | ICD-10-CM | POA: Diagnosis not present

## 2022-05-04 LAB — FRUCTOSAMINE: Fructosamine: 336 umol/L — ABNORMAL HIGH (ref 205–285)

## 2022-05-05 DIAGNOSIS — Z992 Dependence on renal dialysis: Secondary | ICD-10-CM | POA: Diagnosis not present

## 2022-05-05 DIAGNOSIS — N2581 Secondary hyperparathyroidism of renal origin: Secondary | ICD-10-CM | POA: Diagnosis not present

## 2022-05-05 DIAGNOSIS — N186 End stage renal disease: Secondary | ICD-10-CM | POA: Diagnosis not present

## 2022-05-06 DIAGNOSIS — N2581 Secondary hyperparathyroidism of renal origin: Secondary | ICD-10-CM | POA: Diagnosis not present

## 2022-05-06 DIAGNOSIS — N186 End stage renal disease: Secondary | ICD-10-CM | POA: Diagnosis not present

## 2022-05-06 DIAGNOSIS — Z992 Dependence on renal dialysis: Secondary | ICD-10-CM | POA: Diagnosis not present

## 2022-05-08 DIAGNOSIS — Z992 Dependence on renal dialysis: Secondary | ICD-10-CM | POA: Diagnosis not present

## 2022-05-08 DIAGNOSIS — N2581 Secondary hyperparathyroidism of renal origin: Secondary | ICD-10-CM | POA: Diagnosis not present

## 2022-05-08 DIAGNOSIS — N186 End stage renal disease: Secondary | ICD-10-CM | POA: Diagnosis not present

## 2022-05-09 DIAGNOSIS — N186 End stage renal disease: Secondary | ICD-10-CM | POA: Diagnosis not present

## 2022-05-09 DIAGNOSIS — N2581 Secondary hyperparathyroidism of renal origin: Secondary | ICD-10-CM | POA: Diagnosis not present

## 2022-05-09 DIAGNOSIS — Z992 Dependence on renal dialysis: Secondary | ICD-10-CM | POA: Diagnosis not present

## 2022-05-12 DIAGNOSIS — Z992 Dependence on renal dialysis: Secondary | ICD-10-CM | POA: Diagnosis not present

## 2022-05-12 DIAGNOSIS — N186 End stage renal disease: Secondary | ICD-10-CM | POA: Diagnosis not present

## 2022-05-12 DIAGNOSIS — N2581 Secondary hyperparathyroidism of renal origin: Secondary | ICD-10-CM | POA: Diagnosis not present

## 2022-05-13 DIAGNOSIS — Z992 Dependence on renal dialysis: Secondary | ICD-10-CM | POA: Diagnosis not present

## 2022-05-13 DIAGNOSIS — N186 End stage renal disease: Secondary | ICD-10-CM | POA: Diagnosis not present

## 2022-05-13 DIAGNOSIS — N2581 Secondary hyperparathyroidism of renal origin: Secondary | ICD-10-CM | POA: Diagnosis not present

## 2022-05-15 DIAGNOSIS — N186 End stage renal disease: Secondary | ICD-10-CM | POA: Diagnosis not present

## 2022-05-15 DIAGNOSIS — N2581 Secondary hyperparathyroidism of renal origin: Secondary | ICD-10-CM | POA: Diagnosis not present

## 2022-05-15 DIAGNOSIS — Z992 Dependence on renal dialysis: Secondary | ICD-10-CM | POA: Diagnosis not present

## 2022-05-16 DIAGNOSIS — N186 End stage renal disease: Secondary | ICD-10-CM | POA: Diagnosis not present

## 2022-05-16 DIAGNOSIS — Z992 Dependence on renal dialysis: Secondary | ICD-10-CM | POA: Diagnosis not present

## 2022-05-16 DIAGNOSIS — N2581 Secondary hyperparathyroidism of renal origin: Secondary | ICD-10-CM | POA: Diagnosis not present

## 2022-05-20 DIAGNOSIS — N186 End stage renal disease: Secondary | ICD-10-CM | POA: Diagnosis not present

## 2022-05-20 DIAGNOSIS — Z992 Dependence on renal dialysis: Secondary | ICD-10-CM | POA: Diagnosis not present

## 2022-05-20 DIAGNOSIS — N2581 Secondary hyperparathyroidism of renal origin: Secondary | ICD-10-CM | POA: Diagnosis not present

## 2022-05-21 DIAGNOSIS — N186 End stage renal disease: Secondary | ICD-10-CM | POA: Diagnosis not present

## 2022-05-21 DIAGNOSIS — Z992 Dependence on renal dialysis: Secondary | ICD-10-CM | POA: Diagnosis not present

## 2022-05-21 DIAGNOSIS — N2581 Secondary hyperparathyroidism of renal origin: Secondary | ICD-10-CM | POA: Diagnosis not present

## 2022-05-23 DIAGNOSIS — N186 End stage renal disease: Secondary | ICD-10-CM | POA: Diagnosis not present

## 2022-05-23 DIAGNOSIS — Z992 Dependence on renal dialysis: Secondary | ICD-10-CM | POA: Diagnosis not present

## 2022-05-23 DIAGNOSIS — N2581 Secondary hyperparathyroidism of renal origin: Secondary | ICD-10-CM | POA: Diagnosis not present

## 2022-05-24 DIAGNOSIS — N2581 Secondary hyperparathyroidism of renal origin: Secondary | ICD-10-CM | POA: Diagnosis not present

## 2022-05-24 DIAGNOSIS — Z992 Dependence on renal dialysis: Secondary | ICD-10-CM | POA: Diagnosis not present

## 2022-05-24 DIAGNOSIS — N186 End stage renal disease: Secondary | ICD-10-CM | POA: Diagnosis not present

## 2022-05-26 DIAGNOSIS — Z992 Dependence on renal dialysis: Secondary | ICD-10-CM | POA: Diagnosis not present

## 2022-05-26 DIAGNOSIS — N2581 Secondary hyperparathyroidism of renal origin: Secondary | ICD-10-CM | POA: Diagnosis not present

## 2022-05-26 DIAGNOSIS — N186 End stage renal disease: Secondary | ICD-10-CM | POA: Diagnosis not present

## 2022-05-27 DIAGNOSIS — N186 End stage renal disease: Secondary | ICD-10-CM | POA: Diagnosis not present

## 2022-05-27 DIAGNOSIS — Z992 Dependence on renal dialysis: Secondary | ICD-10-CM | POA: Diagnosis not present

## 2022-05-27 DIAGNOSIS — N2581 Secondary hyperparathyroidism of renal origin: Secondary | ICD-10-CM | POA: Diagnosis not present

## 2022-05-29 DIAGNOSIS — Z992 Dependence on renal dialysis: Secondary | ICD-10-CM | POA: Diagnosis not present

## 2022-05-29 DIAGNOSIS — E1129 Type 2 diabetes mellitus with other diabetic kidney complication: Secondary | ICD-10-CM | POA: Diagnosis not present

## 2022-05-29 DIAGNOSIS — N186 End stage renal disease: Secondary | ICD-10-CM | POA: Diagnosis not present

## 2022-05-29 DIAGNOSIS — N2581 Secondary hyperparathyroidism of renal origin: Secondary | ICD-10-CM | POA: Diagnosis not present

## 2022-05-30 DIAGNOSIS — N2581 Secondary hyperparathyroidism of renal origin: Secondary | ICD-10-CM | POA: Diagnosis not present

## 2022-05-30 DIAGNOSIS — N186 End stage renal disease: Secondary | ICD-10-CM | POA: Diagnosis not present

## 2022-05-30 DIAGNOSIS — Z992 Dependence on renal dialysis: Secondary | ICD-10-CM | POA: Diagnosis not present

## 2022-06-02 DIAGNOSIS — N2581 Secondary hyperparathyroidism of renal origin: Secondary | ICD-10-CM | POA: Diagnosis not present

## 2022-06-02 DIAGNOSIS — N186 End stage renal disease: Secondary | ICD-10-CM | POA: Diagnosis not present

## 2022-06-02 DIAGNOSIS — Z992 Dependence on renal dialysis: Secondary | ICD-10-CM | POA: Diagnosis not present

## 2022-06-03 DIAGNOSIS — N186 End stage renal disease: Secondary | ICD-10-CM | POA: Diagnosis not present

## 2022-06-03 DIAGNOSIS — Z992 Dependence on renal dialysis: Secondary | ICD-10-CM | POA: Diagnosis not present

## 2022-06-03 DIAGNOSIS — N2581 Secondary hyperparathyroidism of renal origin: Secondary | ICD-10-CM | POA: Diagnosis not present

## 2022-06-04 ENCOUNTER — Ambulatory Visit (INDEPENDENT_AMBULATORY_CARE_PROVIDER_SITE_OTHER): Payer: BC Managed Care – PPO | Admitting: Podiatry

## 2022-06-04 DIAGNOSIS — E1169 Type 2 diabetes mellitus with other specified complication: Secondary | ICD-10-CM

## 2022-06-04 DIAGNOSIS — B351 Tinea unguium: Secondary | ICD-10-CM | POA: Diagnosis not present

## 2022-06-04 DIAGNOSIS — I739 Peripheral vascular disease, unspecified: Secondary | ICD-10-CM | POA: Diagnosis not present

## 2022-06-05 DIAGNOSIS — Z992 Dependence on renal dialysis: Secondary | ICD-10-CM | POA: Diagnosis not present

## 2022-06-05 DIAGNOSIS — N2581 Secondary hyperparathyroidism of renal origin: Secondary | ICD-10-CM | POA: Diagnosis not present

## 2022-06-05 DIAGNOSIS — N186 End stage renal disease: Secondary | ICD-10-CM | POA: Diagnosis not present

## 2022-06-06 DIAGNOSIS — N2581 Secondary hyperparathyroidism of renal origin: Secondary | ICD-10-CM | POA: Diagnosis not present

## 2022-06-06 DIAGNOSIS — N186 End stage renal disease: Secondary | ICD-10-CM | POA: Diagnosis not present

## 2022-06-06 DIAGNOSIS — Z992 Dependence on renal dialysis: Secondary | ICD-10-CM | POA: Diagnosis not present

## 2022-06-09 DIAGNOSIS — Z992 Dependence on renal dialysis: Secondary | ICD-10-CM | POA: Diagnosis not present

## 2022-06-09 DIAGNOSIS — N186 End stage renal disease: Secondary | ICD-10-CM | POA: Diagnosis not present

## 2022-06-09 DIAGNOSIS — N2581 Secondary hyperparathyroidism of renal origin: Secondary | ICD-10-CM | POA: Diagnosis not present

## 2022-06-10 DIAGNOSIS — N186 End stage renal disease: Secondary | ICD-10-CM | POA: Diagnosis not present

## 2022-06-10 DIAGNOSIS — Z992 Dependence on renal dialysis: Secondary | ICD-10-CM | POA: Diagnosis not present

## 2022-06-10 DIAGNOSIS — N2581 Secondary hyperparathyroidism of renal origin: Secondary | ICD-10-CM | POA: Diagnosis not present

## 2022-06-10 NOTE — Progress Notes (Signed)
  Subjective:  Patient ID: Duane Hall, male    DOB: April 09, 1956,  MRN: [id number]  Chief Complaint  Patient presents with   Nail Problem     Routine foot care    66 y.o. male presents with the above complaint. History confirmed with patient.  Doing well there is no new issues the nails are thickened and elongated.  Objective:  Physical Exam: There is hypertrophy and thickening of the nail plates x5 on the left foot with yellow discoloration and subungual debris.  Pulses are nonpalpable he has some edema.  Foot is warm and well-perfused.  His heel ulcer remains well-healed Assessment:   1. Onychomycosis   2. Type 2 diabetes mellitus with other specified complication, without long-term current use of insulin ([place])   3. PAD (peripheral artery disease) ([place])      Plan:  Patient was evaluated and treated and all questions answered.  Discussed the etiology and treatment options for the condition in detail with the patient. Educated patient on the topical and oral treatment options for mycotic nails. Recommended debridement of the nails today. Sharp and mechanical debridement performed of all painful and mycotic nails today. Nails debrided in length and thickness using a nail nipper to level of comfort. Discussed treatment options including appropriate shoe gear. Follow up as needed for painful nails.   Patient educated on diabetes. Discussed proper diabetic foot care and discussed risks and complications of disease. Educated patient in depth on reasons to return to the office immediately should he/she discover anything concerning or new on the feet. All questions answered. Discussed proper shoes as well.   I checked with our orthotics department and they are looking into the feasibility of getting him diabetic shoes through his insurance, they will contact him when they have more information on this.  No follow-ups on file.

## 2022-06-12 DIAGNOSIS — Z992 Dependence on renal dialysis: Secondary | ICD-10-CM | POA: Diagnosis not present

## 2022-06-12 DIAGNOSIS — N2581 Secondary hyperparathyroidism of renal origin: Secondary | ICD-10-CM | POA: Diagnosis not present

## 2022-06-12 DIAGNOSIS — N186 End stage renal disease: Secondary | ICD-10-CM | POA: Diagnosis not present

## 2022-06-13 DIAGNOSIS — N2581 Secondary hyperparathyroidism of renal origin: Secondary | ICD-10-CM | POA: Diagnosis not present

## 2022-06-13 DIAGNOSIS — N186 End stage renal disease: Secondary | ICD-10-CM | POA: Diagnosis not present

## 2022-06-13 DIAGNOSIS — Z992 Dependence on renal dialysis: Secondary | ICD-10-CM | POA: Diagnosis not present

## 2022-06-16 DIAGNOSIS — Z992 Dependence on renal dialysis: Secondary | ICD-10-CM | POA: Diagnosis not present

## 2022-06-16 DIAGNOSIS — N2581 Secondary hyperparathyroidism of renal origin: Secondary | ICD-10-CM | POA: Diagnosis not present

## 2022-06-16 DIAGNOSIS — N186 End stage renal disease: Secondary | ICD-10-CM | POA: Diagnosis not present

## 2022-06-17 DIAGNOSIS — Z992 Dependence on renal dialysis: Secondary | ICD-10-CM | POA: Diagnosis not present

## 2022-06-17 DIAGNOSIS — N186 End stage renal disease: Secondary | ICD-10-CM | POA: Diagnosis not present

## 2022-06-17 DIAGNOSIS — N2581 Secondary hyperparathyroidism of renal origin: Secondary | ICD-10-CM | POA: Diagnosis not present

## 2022-06-19 DIAGNOSIS — N186 End stage renal disease: Secondary | ICD-10-CM | POA: Diagnosis not present

## 2022-06-19 DIAGNOSIS — N2581 Secondary hyperparathyroidism of renal origin: Secondary | ICD-10-CM | POA: Diagnosis not present

## 2022-06-19 DIAGNOSIS — Z992 Dependence on renal dialysis: Secondary | ICD-10-CM | POA: Diagnosis not present

## 2022-06-20 DIAGNOSIS — N186 End stage renal disease: Secondary | ICD-10-CM | POA: Diagnosis not present

## 2022-06-20 DIAGNOSIS — Z992 Dependence on renal dialysis: Secondary | ICD-10-CM | POA: Diagnosis not present

## 2022-06-20 DIAGNOSIS — N2581 Secondary hyperparathyroidism of renal origin: Secondary | ICD-10-CM | POA: Diagnosis not present

## 2022-06-23 DIAGNOSIS — N186 End stage renal disease: Secondary | ICD-10-CM | POA: Diagnosis not present

## 2022-06-23 DIAGNOSIS — N2581 Secondary hyperparathyroidism of renal origin: Secondary | ICD-10-CM | POA: Diagnosis not present

## 2022-06-23 DIAGNOSIS — Z992 Dependence on renal dialysis: Secondary | ICD-10-CM | POA: Diagnosis not present

## 2022-06-24 DIAGNOSIS — N186 End stage renal disease: Secondary | ICD-10-CM | POA: Diagnosis not present

## 2022-06-24 DIAGNOSIS — N2581 Secondary hyperparathyroidism of renal origin: Secondary | ICD-10-CM | POA: Diagnosis not present

## 2022-06-24 DIAGNOSIS — Z992 Dependence on renal dialysis: Secondary | ICD-10-CM | POA: Diagnosis not present

## 2022-06-27 DIAGNOSIS — N186 End stage renal disease: Secondary | ICD-10-CM | POA: Diagnosis not present

## 2022-06-27 DIAGNOSIS — Z992 Dependence on renal dialysis: Secondary | ICD-10-CM | POA: Diagnosis not present

## 2022-06-27 DIAGNOSIS — N2581 Secondary hyperparathyroidism of renal origin: Secondary | ICD-10-CM | POA: Diagnosis not present

## 2022-06-28 DIAGNOSIS — E1129 Type 2 diabetes mellitus with other diabetic kidney complication: Secondary | ICD-10-CM | POA: Diagnosis not present

## 2022-06-28 DIAGNOSIS — N186 End stage renal disease: Secondary | ICD-10-CM | POA: Diagnosis not present

## 2022-06-28 DIAGNOSIS — Z992 Dependence on renal dialysis: Secondary | ICD-10-CM | POA: Diagnosis not present

## 2022-06-29 DIAGNOSIS — I953 Hypotension of hemodialysis: Secondary | ICD-10-CM | POA: Diagnosis not present

## 2022-06-29 DIAGNOSIS — N2581 Secondary hyperparathyroidism of renal origin: Secondary | ICD-10-CM | POA: Diagnosis not present

## 2022-06-29 DIAGNOSIS — R252 Cramp and spasm: Secondary | ICD-10-CM | POA: Diagnosis not present

## 2022-06-29 DIAGNOSIS — Z992 Dependence on renal dialysis: Secondary | ICD-10-CM | POA: Diagnosis not present

## 2022-06-29 DIAGNOSIS — N186 End stage renal disease: Secondary | ICD-10-CM | POA: Diagnosis not present

## 2022-06-29 DIAGNOSIS — Z4931 Encounter for adequacy testing for hemodialysis: Secondary | ICD-10-CM | POA: Diagnosis not present

## 2022-07-02 DIAGNOSIS — Z4931 Encounter for adequacy testing for hemodialysis: Secondary | ICD-10-CM | POA: Diagnosis not present

## 2022-07-02 DIAGNOSIS — Z992 Dependence on renal dialysis: Secondary | ICD-10-CM | POA: Diagnosis not present

## 2022-07-02 DIAGNOSIS — N2581 Secondary hyperparathyroidism of renal origin: Secondary | ICD-10-CM | POA: Diagnosis not present

## 2022-07-02 DIAGNOSIS — N186 End stage renal disease: Secondary | ICD-10-CM | POA: Diagnosis not present

## 2022-07-02 DIAGNOSIS — R252 Cramp and spasm: Secondary | ICD-10-CM | POA: Diagnosis not present

## 2022-07-02 DIAGNOSIS — I953 Hypotension of hemodialysis: Secondary | ICD-10-CM | POA: Diagnosis not present

## 2022-07-04 DIAGNOSIS — I953 Hypotension of hemodialysis: Secondary | ICD-10-CM | POA: Diagnosis not present

## 2022-07-04 DIAGNOSIS — N2581 Secondary hyperparathyroidism of renal origin: Secondary | ICD-10-CM | POA: Diagnosis not present

## 2022-07-04 DIAGNOSIS — Z992 Dependence on renal dialysis: Secondary | ICD-10-CM | POA: Diagnosis not present

## 2022-07-04 DIAGNOSIS — N186 End stage renal disease: Secondary | ICD-10-CM | POA: Diagnosis not present

## 2022-07-04 DIAGNOSIS — Z4931 Encounter for adequacy testing for hemodialysis: Secondary | ICD-10-CM | POA: Diagnosis not present

## 2022-07-04 DIAGNOSIS — R252 Cramp and spasm: Secondary | ICD-10-CM | POA: Diagnosis not present

## 2022-07-06 DIAGNOSIS — Z992 Dependence on renal dialysis: Secondary | ICD-10-CM | POA: Diagnosis not present

## 2022-07-06 DIAGNOSIS — I953 Hypotension of hemodialysis: Secondary | ICD-10-CM | POA: Diagnosis not present

## 2022-07-06 DIAGNOSIS — Z4931 Encounter for adequacy testing for hemodialysis: Secondary | ICD-10-CM | POA: Diagnosis not present

## 2022-07-06 DIAGNOSIS — R252 Cramp and spasm: Secondary | ICD-10-CM | POA: Diagnosis not present

## 2022-07-06 DIAGNOSIS — N186 End stage renal disease: Secondary | ICD-10-CM | POA: Diagnosis not present

## 2022-07-06 DIAGNOSIS — N2581 Secondary hyperparathyroidism of renal origin: Secondary | ICD-10-CM | POA: Diagnosis not present

## 2022-07-09 DIAGNOSIS — N186 End stage renal disease: Secondary | ICD-10-CM | POA: Diagnosis not present

## 2022-07-09 DIAGNOSIS — I953 Hypotension of hemodialysis: Secondary | ICD-10-CM | POA: Diagnosis not present

## 2022-07-09 DIAGNOSIS — Z992 Dependence on renal dialysis: Secondary | ICD-10-CM | POA: Diagnosis not present

## 2022-07-09 DIAGNOSIS — N2581 Secondary hyperparathyroidism of renal origin: Secondary | ICD-10-CM | POA: Diagnosis not present

## 2022-07-09 DIAGNOSIS — Z4931 Encounter for adequacy testing for hemodialysis: Secondary | ICD-10-CM | POA: Diagnosis not present

## 2022-07-09 DIAGNOSIS — R252 Cramp and spasm: Secondary | ICD-10-CM | POA: Diagnosis not present

## 2022-07-11 DIAGNOSIS — N186 End stage renal disease: Secondary | ICD-10-CM | POA: Diagnosis not present

## 2022-07-11 DIAGNOSIS — N2581 Secondary hyperparathyroidism of renal origin: Secondary | ICD-10-CM | POA: Diagnosis not present

## 2022-07-11 DIAGNOSIS — Z4931 Encounter for adequacy testing for hemodialysis: Secondary | ICD-10-CM | POA: Diagnosis not present

## 2022-07-11 DIAGNOSIS — I953 Hypotension of hemodialysis: Secondary | ICD-10-CM | POA: Diagnosis not present

## 2022-07-11 DIAGNOSIS — Z992 Dependence on renal dialysis: Secondary | ICD-10-CM | POA: Diagnosis not present

## 2022-07-11 DIAGNOSIS — R252 Cramp and spasm: Secondary | ICD-10-CM | POA: Diagnosis not present

## 2022-07-13 DIAGNOSIS — R252 Cramp and spasm: Secondary | ICD-10-CM | POA: Diagnosis not present

## 2022-07-13 DIAGNOSIS — N2581 Secondary hyperparathyroidism of renal origin: Secondary | ICD-10-CM | POA: Diagnosis not present

## 2022-07-13 DIAGNOSIS — N186 End stage renal disease: Secondary | ICD-10-CM | POA: Diagnosis not present

## 2022-07-13 DIAGNOSIS — I953 Hypotension of hemodialysis: Secondary | ICD-10-CM | POA: Diagnosis not present

## 2022-07-13 DIAGNOSIS — Z4931 Encounter for adequacy testing for hemodialysis: Secondary | ICD-10-CM | POA: Diagnosis not present

## 2022-07-13 DIAGNOSIS — Z992 Dependence on renal dialysis: Secondary | ICD-10-CM | POA: Diagnosis not present

## 2022-07-16 DIAGNOSIS — R252 Cramp and spasm: Secondary | ICD-10-CM | POA: Diagnosis not present

## 2022-07-16 DIAGNOSIS — I953 Hypotension of hemodialysis: Secondary | ICD-10-CM | POA: Diagnosis not present

## 2022-07-16 DIAGNOSIS — N186 End stage renal disease: Secondary | ICD-10-CM | POA: Diagnosis not present

## 2022-07-16 DIAGNOSIS — Z992 Dependence on renal dialysis: Secondary | ICD-10-CM | POA: Diagnosis not present

## 2022-07-16 DIAGNOSIS — Z4931 Encounter for adequacy testing for hemodialysis: Secondary | ICD-10-CM | POA: Diagnosis not present

## 2022-07-16 DIAGNOSIS — N2581 Secondary hyperparathyroidism of renal origin: Secondary | ICD-10-CM | POA: Diagnosis not present

## 2022-07-18 DIAGNOSIS — R252 Cramp and spasm: Secondary | ICD-10-CM | POA: Diagnosis not present

## 2022-07-18 DIAGNOSIS — I953 Hypotension of hemodialysis: Secondary | ICD-10-CM | POA: Diagnosis not present

## 2022-07-18 DIAGNOSIS — N186 End stage renal disease: Secondary | ICD-10-CM | POA: Diagnosis not present

## 2022-07-18 DIAGNOSIS — N2581 Secondary hyperparathyroidism of renal origin: Secondary | ICD-10-CM | POA: Diagnosis not present

## 2022-07-18 DIAGNOSIS — Z992 Dependence on renal dialysis: Secondary | ICD-10-CM | POA: Diagnosis not present

## 2022-07-18 DIAGNOSIS — Z4931 Encounter for adequacy testing for hemodialysis: Secondary | ICD-10-CM | POA: Diagnosis not present

## 2022-07-21 DIAGNOSIS — N2581 Secondary hyperparathyroidism of renal origin: Secondary | ICD-10-CM | POA: Diagnosis not present

## 2022-07-21 DIAGNOSIS — I953 Hypotension of hemodialysis: Secondary | ICD-10-CM | POA: Diagnosis not present

## 2022-07-21 DIAGNOSIS — N186 End stage renal disease: Secondary | ICD-10-CM | POA: Diagnosis not present

## 2022-07-21 DIAGNOSIS — Z4931 Encounter for adequacy testing for hemodialysis: Secondary | ICD-10-CM | POA: Diagnosis not present

## 2022-07-21 DIAGNOSIS — Z992 Dependence on renal dialysis: Secondary | ICD-10-CM | POA: Diagnosis not present

## 2022-07-21 DIAGNOSIS — R252 Cramp and spasm: Secondary | ICD-10-CM | POA: Diagnosis not present

## 2022-07-22 DIAGNOSIS — Z4931 Encounter for adequacy testing for hemodialysis: Secondary | ICD-10-CM | POA: Diagnosis not present

## 2022-07-22 DIAGNOSIS — Z992 Dependence on renal dialysis: Secondary | ICD-10-CM | POA: Diagnosis not present

## 2022-07-22 DIAGNOSIS — T82858A Stenosis of vascular prosthetic devices, implants and grafts, initial encounter: Secondary | ICD-10-CM | POA: Diagnosis not present

## 2022-07-22 DIAGNOSIS — N186 End stage renal disease: Secondary | ICD-10-CM | POA: Diagnosis not present

## 2022-07-22 DIAGNOSIS — I953 Hypotension of hemodialysis: Secondary | ICD-10-CM | POA: Diagnosis not present

## 2022-07-22 DIAGNOSIS — N2581 Secondary hyperparathyroidism of renal origin: Secondary | ICD-10-CM | POA: Diagnosis not present

## 2022-07-22 DIAGNOSIS — R252 Cramp and spasm: Secondary | ICD-10-CM | POA: Diagnosis not present

## 2022-07-22 DIAGNOSIS — I871 Compression of vein: Secondary | ICD-10-CM | POA: Diagnosis not present

## 2022-07-24 DIAGNOSIS — N2581 Secondary hyperparathyroidism of renal origin: Secondary | ICD-10-CM | POA: Diagnosis not present

## 2022-07-24 DIAGNOSIS — I953 Hypotension of hemodialysis: Secondary | ICD-10-CM | POA: Diagnosis not present

## 2022-07-24 DIAGNOSIS — R252 Cramp and spasm: Secondary | ICD-10-CM | POA: Diagnosis not present

## 2022-07-24 DIAGNOSIS — Z4931 Encounter for adequacy testing for hemodialysis: Secondary | ICD-10-CM | POA: Diagnosis not present

## 2022-07-24 DIAGNOSIS — N186 End stage renal disease: Secondary | ICD-10-CM | POA: Diagnosis not present

## 2022-07-24 DIAGNOSIS — Z992 Dependence on renal dialysis: Secondary | ICD-10-CM | POA: Diagnosis not present

## 2022-07-25 DIAGNOSIS — I953 Hypotension of hemodialysis: Secondary | ICD-10-CM | POA: Diagnosis not present

## 2022-07-25 DIAGNOSIS — R252 Cramp and spasm: Secondary | ICD-10-CM | POA: Diagnosis not present

## 2022-07-25 DIAGNOSIS — Z4931 Encounter for adequacy testing for hemodialysis: Secondary | ICD-10-CM | POA: Diagnosis not present

## 2022-07-25 DIAGNOSIS — N186 End stage renal disease: Secondary | ICD-10-CM | POA: Diagnosis not present

## 2022-07-25 DIAGNOSIS — N2581 Secondary hyperparathyroidism of renal origin: Secondary | ICD-10-CM | POA: Diagnosis not present

## 2022-07-25 DIAGNOSIS — Z992 Dependence on renal dialysis: Secondary | ICD-10-CM | POA: Diagnosis not present

## 2022-07-28 DIAGNOSIS — N186 End stage renal disease: Secondary | ICD-10-CM | POA: Diagnosis not present

## 2022-07-28 DIAGNOSIS — Z4931 Encounter for adequacy testing for hemodialysis: Secondary | ICD-10-CM | POA: Diagnosis not present

## 2022-07-28 DIAGNOSIS — Z992 Dependence on renal dialysis: Secondary | ICD-10-CM | POA: Diagnosis not present

## 2022-07-28 DIAGNOSIS — R252 Cramp and spasm: Secondary | ICD-10-CM | POA: Diagnosis not present

## 2022-07-28 DIAGNOSIS — N2581 Secondary hyperparathyroidism of renal origin: Secondary | ICD-10-CM | POA: Diagnosis not present

## 2022-07-28 DIAGNOSIS — I953 Hypotension of hemodialysis: Secondary | ICD-10-CM | POA: Diagnosis not present

## 2022-07-29 DIAGNOSIS — I953 Hypotension of hemodialysis: Secondary | ICD-10-CM | POA: Diagnosis not present

## 2022-07-29 DIAGNOSIS — E1129 Type 2 diabetes mellitus with other diabetic kidney complication: Secondary | ICD-10-CM | POA: Diagnosis not present

## 2022-07-29 DIAGNOSIS — R252 Cramp and spasm: Secondary | ICD-10-CM | POA: Diagnosis not present

## 2022-07-29 DIAGNOSIS — Z4931 Encounter for adequacy testing for hemodialysis: Secondary | ICD-10-CM | POA: Diagnosis not present

## 2022-07-29 DIAGNOSIS — N186 End stage renal disease: Secondary | ICD-10-CM | POA: Diagnosis not present

## 2022-07-29 DIAGNOSIS — N2581 Secondary hyperparathyroidism of renal origin: Secondary | ICD-10-CM | POA: Diagnosis not present

## 2022-07-29 DIAGNOSIS — Z992 Dependence on renal dialysis: Secondary | ICD-10-CM | POA: Diagnosis not present

## 2022-08-29 DIAGNOSIS — N186 End stage renal disease: Secondary | ICD-10-CM | POA: Diagnosis not present

## 2022-08-29 DIAGNOSIS — E1129 Type 2 diabetes mellitus with other diabetic kidney complication: Secondary | ICD-10-CM | POA: Diagnosis not present

## 2022-08-29 DIAGNOSIS — Z992 Dependence on renal dialysis: Secondary | ICD-10-CM | POA: Diagnosis not present

## 2022-08-30 ENCOUNTER — Ambulatory Visit (INDEPENDENT_AMBULATORY_CARE_PROVIDER_SITE_OTHER): Payer: Medicare Other | Admitting: Internal Medicine

## 2022-08-30 ENCOUNTER — Encounter: Payer: Self-pay | Admitting: Internal Medicine

## 2022-08-30 VITALS — BP 106/68 | HR 96 | Ht 69.0 in | Wt 185.0 lb

## 2022-08-30 DIAGNOSIS — Z89511 Acquired absence of right leg below knee: Secondary | ICD-10-CM

## 2022-08-30 DIAGNOSIS — E113593 Type 2 diabetes mellitus with proliferative diabetic retinopathy without macular edema, bilateral: Secondary | ICD-10-CM

## 2022-08-30 DIAGNOSIS — Z992 Dependence on renal dialysis: Secondary | ICD-10-CM | POA: Diagnosis not present

## 2022-08-30 DIAGNOSIS — N186 End stage renal disease: Secondary | ICD-10-CM

## 2022-08-30 DIAGNOSIS — Z794 Long term (current) use of insulin: Secondary | ICD-10-CM

## 2022-08-30 DIAGNOSIS — E1129 Type 2 diabetes mellitus with other diabetic kidney complication: Secondary | ICD-10-CM | POA: Diagnosis not present

## 2022-08-30 DIAGNOSIS — E1122 Type 2 diabetes mellitus with diabetic chronic kidney disease: Secondary | ICD-10-CM

## 2022-08-30 LAB — POCT GLYCOSYLATED HEMOGLOBIN (HGB A1C): Hemoglobin A1C: 6 % — AB (ref 4.0–5.6)

## 2022-08-30 NOTE — Progress Notes (Unsigned)
Name: Duane Hall  Age/ Sex: 66 y.o., male   MRN/ DOB: [id number], 11-03-1956     PCP: Pieter Partridge, PA (Inactive)   Reason for Endocrinology Evaluation: Type 2 Diabetes Mellitus  Initial Endocrine Consultative Visit: 08/16/2020    PATIENT IDENTIFIER: Duane Hall is a 66 y.o. male with a past medical history of HTN, ESRD on HD, PVD, asthma, gastroparesis. The patient has followed with Endocrinology clinic since 08/16/2020 for consultative assistance with management of his diabetes.  DIABETIC HISTORY:  Duane Hall was diagnosed with DM 2005, and started insulin therapy in 2021, omnipod started 12/2020. Intolerant to Trulicity due to gastroparesis. His hemoglobin A1c has ranged from 5.6% in 2023, peaking at 11.8% in 2016.  He was followed by Dr. Loanne Drilling on 2021 until 04/2022 SUBJECTIVE:   During the last visit (04/30/2022): saw Dr. Loanne Drilling   Today (08/30/2022): Duane Hall is here for follow-up on diabetes management.Marland Kitchen  He checks his blood sugars multiple times daily, through CGM.  The patient has  not had hypoglycemic episodes since the last clinic visit.   Has chronic digestive issues with occasional vomiting  Pt on home dialysis 'Sunday, Monday, Thursday and Wednesday  Follows with podiatry   This patient with type 2 diabetes is treated with OmniPod 5 (insulin pump). During the visit the pump basal and bolus doses were reviewed including carb/insulin rations and supplemental doses. The clinical list was updated. The glucose Hall download was reviewed in detail to determine if the current pump settings are providing the best glycemic control without excessive hypoglycemia.  Pump and Hall download:    Pump   OmniPod 5  Settings   Insulin type   Humalog   Basal rate       0000 0.15 u/h    0900 0.3 u/h    2100 0.15 u/h       I:C ratio       0000  1:6                  Sensitivity       0000  100      Goal       00'$ 00  110            Type & Model of Pump:  OmniPod 5 Insulin Type: Currently using Humalog.  Body mass index is 27.32 kg/m.  PUMP STATISTICS: Average BG: 161  Average Daily Carbs (g): 93.9  Average Total Daily Insulin: 44.7  Average Daily Basal: 29.5 (66 %) Average Daily Bolus: 15.2 (34 %)      HOME DIABETES REGIMEN:  Humalog   Statin: Yes ACE-I/ARB: no    DIABETIC COMPLICATIONS: Microvascular complications:  Proliferative DR ( S/P laser) , ESRD on dialysis ,Right BKA  Last Eye Exam: Completed 2022   Macrovascular complications:  PVD Denies: CAD, CVA   HISTORY:  Past Medical History:  Past Medical History:  Diagnosis Date   Asthma    Cancer ([place])    Cataract    CKD (chronic kidney disease), stage III ([place])    Diabetes mellitus    Glaucoma    Vascular insufficiency 05/2020   Past Surgical History:  Past Surgical History:  Procedure Laterality Date   ABDOMINAL AORTOGRAM W/LOWER EXTREMITY N/A 06/19/2020   Procedure: ABDOMINAL AORTOGRAM W/LOWER EXTREMITY;  Surgeon: Waynetta Sandy, MD;  Location: [facility];  Service: Cardiovascular;  Laterality: N/A;   ABDOMINAL AORTOGRAM W/LOWER EXTREMITY Left 10/16/2020   Procedure: ABDOMINAL AORTOGRAM W/LOWER EXTREMITY;  Surgeon: Waynetta Sandy, MD;  Location: [facility];  Service: Cardiovascular;  Laterality: Left;   ABDOMINAL AORTOGRAM W/LOWER EXTREMITY N/A 12/25/2020   Procedure: ABDOMINAL AORTOGRAM W/LOWER EXTREMITY;  Surgeon: Waynetta Sandy, MD;  Location: [facility];  Service: Cardiovascular;  Laterality: N/A;   AMPUTATION Right 01/12/2021   Procedure: 1ST AMPUTATION RAY;  Surgeon: [name], DPM;  Location: [facility];  Service: Podiatry;  Laterality: Right;   AMPUTATION Right 03/13/2021   Procedure: RIGHT BELOW KNEE AMPUTATION;  Surgeon: Waynetta Sandy, MD;  Location: [facility];  Service: Vascular;  Laterality: Right;   AV FISTULA PLACEMENT Right 09/14/2019   Procedure: Right Arm Basilic Vein  transposition;  Surgeon: Angelia Mould, MD;  Location: [facility];  Service: Vascular;  Laterality: Right;   BONE BIOPSY Left 07/29/2020   Procedure: BONE BIOPSY;  Surgeon: [name], DPM;  Location: [facility];  Service: Podiatry;  Laterality: Left;  Need bone trephines and/or large bore Giamshidi   COLONOSCOPY     COLONOSCOPY WITH PROPOFOL N/A 12/07/2021   Procedure: COLONOSCOPY WITH PROPOFOL;  Surgeon: Carol Ada, MD;  [facility];  Service: Endoscopy;  Laterality: N/A;   PERIPHERAL VASCULAR ATHERECTOMY Left 06/19/2020   Procedure: PERIPHERAL VASCULAR ATHERECTOMY;  Surgeon: Waynetta Sandy, MD;  Location: [facility];  Service: Cardiovascular;  Laterality: Left;  SFA   PERIPHERAL VASCULAR ATHERECTOMY  12/25/2020   Procedure: PERIPHERAL VASCULAR ATHERECTOMY;  Surgeon: Waynetta Sandy, MD;  Location: [place] [facility];  Service: Cardiovascular;;  Lt. PT - Laser Lt. SFA - Laser   PERIPHERAL VASCULAR BALLOON ANGIOPLASTY  12/25/2020   Procedure: PERIPHERAL VASCULAR BALLOON ANGIOPLASTY;  Surgeon: Waynetta Sandy, MD;  Location: [facility];  Service: Cardiovascular;;  Lt. SFA and PT   POLYPECTOMY  12/07/2021   Procedure: POLYPECTOMY;  Surgeon: Carol Ada, MD;  [facility];  Service: Endoscopy;;   UPPER GASTROINTESTINAL ENDOSCOPY  02/2019   Dr Benson [place]     Social History:  reports that he has been smoking cigarettes. He has been smoking an average of .3 packs per day. He has never used smokeless tobacco. He reports that he does not drink alcohol and does not use drugs. Family History:  Family History  Problem Relation Age of Onset   Cancer Father    Diabetes Mother      HOME MEDICATIONS: Allergies as of 08/30/2022       Reactions   Amoxicillin-pot Clavulanate Diarrhea   Midodrine Other (See Comments)   Other reaction(s): Urinary Sensation   Tape Other (See Comments)   Latex band aids cause blistering         Medication List        Accurate as of August 30, 2022  1:02 PM. If you have any questions, ask your nurse or doctor.          aspirin EC 81 MG tablet Take 1 tablet (81 mg total) by mouth daily. Swallow whole. What changed: when to take this   clopidogrel 75 MG tablet Commonly known as: PLAVIX Take 1 tablet (75 mg total) by mouth daily with breakfast. What changed: when to take this   Dexcom G6 Receiver Devi Use as Directed   Dexcom G6 Sensor Misc 1 Device by Does not apply route See admin instructions. Change every 10 days   Dexcom G6 Transmitter Misc Change every 3 mos   docusate sodium 100 MG capsule Commonly known as: COLACE Take 100  mg by mouth every morning.   Gvoke HypoPen 1-Pack 1 MG/0.2ML Soaj Generic drug: Glucagon Inject 1 mg into the skin once as needed (low blood sugar).   insulin lispro 100 UNIT/ML injection Commonly known as: HumaLOG For use in pump, total of 60 units per day   iron sucrose in sodium chloride 0.9 % 100 mL Iron Sucrose (Venofer) Self Administer at [place] into the skin.   multivitamin Tabs tablet Take 1 tablet by mouth every morning.   Omnipod 5 G6 Pod (Gen 5) Misc 1 Device by Does not apply route every 3 (three) days.   pantoprazole 40 MG tablet Commonly known as: PROTONIX Take 1 tablet (40 mg total) by mouth daily.   rosuvastatin 10 MG tablet Commonly known as: CRESTOR TAKE 1 TABLET BY MOUTH DAILY What changed: when to take this         OBJECTIVE:   Vital Signs: BP 106/68 (BP Location: Left Arm, Patient Position: Sitting, Cuff Size: Small)   Pulse 96   Ht '5\' 9"'$  (1.753 m)   Wt 185 lb (83.9 kg)   SpO2 99%   BMI 27.32 kg/m   Wt Readings from Last 3 Encounters:  08/30/22 185 lb (83.9 kg)  04/30/22 189 lb 6.4 oz (85.9 kg)  01/29/22 179 lb 12.8 oz (81.6 kg)     Exam: General: Pt appears well and is in NAD  Neck: General: Supple without adenopathy. Thyroid: Thyroid size normal.  No  goiter or nodules appreciated.   Lungs: Clear with good BS bilat   Heart: RRR   Abdomen:  soft, nontender  Extremities: Right BNA   Neuro: MS is good with appropriate affect, pt is alert and Ox3      DATA REVIEWED:  Lab Results  Component Value Date   HGBA1C 6.0 (A) 08/30/2022   HGBA1C 5.6 04/30/2022   HGBA1C 7.1 (A) 01/29/2022   Lab Results  Component Value Date   LDLCALC 56 11/08/2015   CREATININE 5.10 (H) 12/07/2021   No results found for: "MICRALBCREAT"   Lab Results  Component Value Date   CHOL 102 (L) 11/08/2015   HDL 29 (L) 11/08/2015   LDLCALC 56 11/08/2015   TRIG 86 11/08/2015   CHOLHDL 3.5 11/08/2015         ASSESSMENT / PLAN / RECOMMENDATIONS:   1) Type 2 Diabetes Mellitus, optimally controlled, With retinopathic complications, ESRD on HD and S/P BKA- Most recent A1c of 6.0 %. Goal A1c < 7.0 %.     - A1c skewed due to ESRD - CGM A1c 7.2% - I have reviewed his pump download, he has been noted with postprandial hyperglycemia as well a occasional hypoglycemia overnight  - Pt also c/o hyperglycemia and having to resort to entering" fake" cabs , I did discourage the pt from this practice as it will increase his risk for hypoglycemia  - I will adjust his insulin to carb ratio as below  - I will also adjust his sensitivity factor  - I am also going to reduce basal rate at night to prevent hypoglycemia      MEDICATIONS: Humalog     Pump   OmniPod 5  Settings   Insulin type   Humalog   Basal rate       0000 0.10 u/h    0600 0.15 u/h    0900 0.30   2100 0.15 u/h       I:C ratio       0000  1:5                  Sensitivity       0000  50      Goal       0000  110          EDUCATION / INSTRUCTIONS: BG monitoring instructions: Patient is instructed to check his blood sugars 3 times a day, before each meal . Call [facility] if: BG persistently < 70  I reviewed the Rule of 15 for the treatment of hypoglycemia in detail  with the patient. Literature supplied.    2) Diabetic complications:  Eye: Does  have known diabetic retinopathy.  Neuro/ Feet: Does not have known diabetic peripheral neuropathy .  Renal: Patient does  have known baseline CKD. He   is not on an ACEI/ARB at present.       F/U in 4 months     Signed electronically by: Mack Guise, MD  [facility]  [facility] [address] [place] [employer], [place] [address] Phone: [phone] FAX: [phone]   CC: Pieter Partridge, [place] (Inactive) [address] [place] [address] Phone: [phone]  Fax: [phone]  Return to Endocrinology clinic as below: Future Appointments  Date Time Provider [place]  12/03/2022  9:15 AM McDonald, Stephan Minister, DPM [facility]

## 2022-08-30 NOTE — Patient Instructions (Addendum)
   Pump   OmniPod 5  Settings   Insulin type   Humalog   Basal rate       0000 0.10 u/h    0600 0.15 u/h    0900 0.30   2100 0.15 u/h       I:C ratio       0000  1:5                  Sensitivity       0000  50            HOW TO TREAT LOW BLOOD SUGARS (Blood sugar LESS THAN 70 MG/DL) Please follow the RULE OF 15 for the treatment of hypoglycemia treatment (when your (blood sugars are less than 70 mg/dL)   STEP 1: Take 15 grams of carbohydrates when your blood sugar is low, which includes:  3-4 GLUCOSE TABS  OR 3-4 OZ OF JUICE OR REGULAR SODA OR ONE TUBE OF GLUCOSE GEL    STEP 2: RECHECK blood sugar in 15 MINUTES STEP 3: If your blood sugar is still low at the 15 minute recheck --> then, go back to STEP 1 and treat AGAIN with another 15 grams of carbohydrates.

## 2022-09-01 MED ORDER — INSULIN LISPRO 100 UNIT/ML IJ SOLN: INTRAMUSCULAR | 3 refills | Status: DC

## 2022-09-03 ENCOUNTER — Encounter: Payer: Self-pay | Admitting: Internal Medicine

## 2022-09-05 ENCOUNTER — Telehealth: Payer: Self-pay | Admitting: Pharmacy Technician

## 2022-09-05 ENCOUNTER — Other Ambulatory Visit (HOSPITAL_COMMUNITY): Payer: Self-pay

## 2022-09-05 NOTE — Telephone Encounter (Signed)
Patient Advocate Encounter   Received notification that prior authorization for Dexcom G6 is due for renewal.   Next fill not eligible until November. However, pt's ins has changed and may not need a PA

## 2022-09-06 ENCOUNTER — Encounter: Payer: Self-pay | Admitting: Podiatry

## 2022-09-06 ENCOUNTER — Ambulatory Visit (INDEPENDENT_AMBULATORY_CARE_PROVIDER_SITE_OTHER): Payer: Medicare Other | Admitting: Podiatry

## 2022-09-06 ENCOUNTER — Ambulatory Visit (INDEPENDENT_AMBULATORY_CARE_PROVIDER_SITE_OTHER): Payer: Medicare Other

## 2022-09-06 DIAGNOSIS — I739 Peripheral vascular disease, unspecified: Secondary | ICD-10-CM | POA: Diagnosis not present

## 2022-09-06 DIAGNOSIS — E1169 Type 2 diabetes mellitus with other specified complication: Secondary | ICD-10-CM

## 2022-09-06 DIAGNOSIS — S92515A Nondisplaced fracture of proximal phalanx of left lesser toe(s), initial encounter for closed fracture: Secondary | ICD-10-CM | POA: Diagnosis not present

## 2022-09-06 DIAGNOSIS — L97529 Non-pressure chronic ulcer of other part of left foot with unspecified severity: Secondary | ICD-10-CM

## 2022-09-06 NOTE — Progress Notes (Signed)
  Subjective:  Patient ID: Duane Hall, male    DOB: 1956/09/19,   MRN: [id number]  Chief Complaint  Patient presents with   Nail Problem     EST - L GREAT TOE INJURY - CUT BELOW NAIL     66 y.o. male presents for concern of left great toe injury. States he banged his toe several days ago getting into the shower and has developed a wound on the great toe. His wife has been dressing with silvadene and neosporin.  He is diabetic and last A1c was 6.0. Has a history of PAD and right BKA. Relates he has had a bad experience in the hospital and refuses to go to the hospital again.   Denies any other pedal complaints. Denies n/v/f/c.   Past Medical History:  Diagnosis Date   Asthma    Cancer ([place])    Cataract    CKD (chronic kidney disease), stage III ([place])    Diabetes mellitus    Glaucoma    Vascular insufficiency 05/2020    Objective:  Physical Exam: Vascular: DP/PT pulses 2/4 bilateral. CFT <3 seconds. Normal hair growth on digits. No edema.  Skin. No lacerations or abrasions bilateral feet. Distal left hallux with necrotic wound about 1cm x 1 cm with overlying necrosis. No erythema or edema noted around toe. No purulence noted.  Musculoskeletal: MMT 5/5 bilateral lower extremities in DF, PF, Inversion and Eversion. Deceased ROM in DF of ankle joint. BKA on the right.  Neurological: Sensation intact to light touch.   Assessment:   1. Type 2 diabetes mellitus with other specified complication, without long-term current use of insulin ([place])   2. PAD (peripheral artery disease) ([place])   3. Skin ulcer of left great toe, unspecified ulcer stage ([place])      Plan:  Patient was evaluated and treated and all questions answered. Ulcer necrotic ulceration on distal left hallux.  -No debridement preformed today.  -Dressed with betadine, DSD. Daily dressing changes discussed.  -Off-loading with  padding. Patient relates unable to ambulate in surgical shoe.  -No abx indicated.  -ABIs  ordered as previous are from a year ago and showed decreased toe pressures.  -Discussed glucose control and proper protein-rich diet.  -Discussed if any worsening redness, pain, fever or chills to call or may need to report to the emergency room. Patient expressed understanding but relates he will not go to hospital.      Return in about 2 weeks (around 09/20/2022) for wound check.   Lorenda Peck, DPM

## 2022-09-19 ENCOUNTER — Encounter: Payer: Self-pay | Admitting: Podiatry

## 2022-09-20 MED ORDER — DOXYCYCLINE HYCLATE 100 MG PO TABS: 100.0000 mg | ORAL_TABLET | Freq: Two times a day (BID) | ORAL | 0 refills | Status: DC

## 2022-09-24 ENCOUNTER — Ambulatory Visit (INDEPENDENT_AMBULATORY_CARE_PROVIDER_SITE_OTHER): Payer: Medicare Other | Admitting: Podiatry

## 2022-09-24 DIAGNOSIS — I96 Gangrene, not elsewhere classified: Secondary | ICD-10-CM | POA: Diagnosis not present

## 2022-09-26 NOTE — Progress Notes (Signed)
  Subjective:  Patient ID: Duane Hall, male    DOB: [birth year],  MRN: [id number]  Chief Complaint  Patient presents with   Foot Ulcer    2 week follow up left great toe ulcer    66 y.o. male presents with the above complaint. History confirmed with patient.  Issue began a couple weeks ago.  Overall with been fairly dry.  The swelling and edema has improved since he has been on antibiotics  Objective:  Physical Exam: warm, good capillary refill, no trophic changes or ulcerative lesions, and DP and PT pulses are weak, capillary fill time is normal in toes, dry gangrenous patch on distal tip of hallux, skin is cool to touch here.      Assessment:   1. Dry gangrene ([place])      Plan:  Patient was evaluated and treated and all questions answered.  He will complete his course of antibiotics.  He does have a patch of dry gangrene on the tip of the hallux.  He has an upcoming appoint with Dr. Donzetta Matters from vascular surgery I will discuss his case with him.  I will see him back in 2 weeks for reevaluation  Return in about 2 weeks (around 10/08/2022) for wound care.

## 2022-09-29 DIAGNOSIS — Z992 Dependence on renal dialysis: Secondary | ICD-10-CM | POA: Diagnosis not present

## 2022-09-29 DIAGNOSIS — N186 End stage renal disease: Secondary | ICD-10-CM | POA: Diagnosis not present

## 2022-09-29 DIAGNOSIS — E1129 Type 2 diabetes mellitus with other diabetic kidney complication: Secondary | ICD-10-CM | POA: Diagnosis not present

## 2022-10-08 ENCOUNTER — Ambulatory Visit (INDEPENDENT_AMBULATORY_CARE_PROVIDER_SITE_OTHER): Payer: Medicare Other | Admitting: Podiatry

## 2022-10-08 DIAGNOSIS — L97529 Non-pressure chronic ulcer of other part of left foot with unspecified severity: Secondary | ICD-10-CM | POA: Diagnosis not present

## 2022-10-08 DIAGNOSIS — I96 Gangrene, not elsewhere classified: Secondary | ICD-10-CM | POA: Diagnosis not present

## 2022-10-08 MED ORDER — SANTYL 250 UNIT/GM EX OINT: 1.0000 | TOPICAL_OINTMENT | Freq: Every day | CUTANEOUS | 0 refills | Status: DC

## 2022-10-08 NOTE — Patient Instructions (Signed)
Change dressing daily:  Apply santyl ointment the thickness of a nickel. Dress with a saline moistened 2x2 gauze. Then dry 4x4 gauze and tape.

## 2022-10-10 ENCOUNTER — Telehealth: Payer: Self-pay | Admitting: *Deleted

## 2022-10-10 NOTE — Telephone Encounter (Signed)
Patient is calling to ask that his prescription be resent to another pharmacy, having problems getting(on [facility])not sure when they will have in stock. Called his other pharmacy([facility]) on file, do not have in stock either, can something else be sent in ?

## 2022-10-11 ENCOUNTER — Other Ambulatory Visit: Payer: Self-pay | Admitting: *Deleted

## 2022-10-11 MED ORDER — SANTYL 250 UNIT/GM EX OINT: 1.0000 | TOPICAL_OINTMENT | Freq: Every day | CUTANEOUS | 0 refills | Status: DC

## 2022-10-11 NOTE — Telephone Encounter (Signed)
Sent prescription for santyl to [facility] on [address], patient aware, also gave him the alternatives per physician, would have the santyl.

## 2022-10-11 NOTE — Telephone Encounter (Signed)
Called and spoke with pharmacist '@[facility]  on [address], they do have the santyl-30 g in stock, resending the prescription over, patient is aware.

## 2022-10-13 ENCOUNTER — Other Ambulatory Visit: Payer: Self-pay | Admitting: *Deleted

## 2022-10-13 DIAGNOSIS — I739 Peripheral vascular disease, unspecified: Secondary | ICD-10-CM

## 2022-10-13 NOTE — Progress Notes (Signed)
  Subjective:  Patient ID: Duane Hall, male    DOB: [birth year],  MRN: [id number]  Chief Complaint  Patient presents with   Foot Ulcer    Left great toe, dry gangrene, 3 week follow up    66 y.o. male presents with the above complaint. History confirmed with patient.  Has had some improvement with antibiotics. He sees vascular surgery next week  Objective:  Physical Exam: warm, good capillary refill, no trophic changes or ulcerative lesions, and DP and PT pulses are weak, capillary fill time is normal in toes, dry gangrenous patch on distal tip of hallux, skin is cool to touch here, no signs of acute infection     Assessment:   1. Dry gangrene ([place] Hall)   2. Skin ulcer of left great toe, unspecified ulcer stage ([place] Hall)       Plan:  Patient was evaluated and treated and all questions answered.  Gangrene is stable and does not appear to be progressive. We discussed risk of limb loss if his flow is maximized. I think we can begin enzymatic debridement with local wound care. Santyl rx sent to pharmacy, change qD with saline wet to dry.   Return in about 3 weeks (around 10/29/2022) for wound care.

## 2022-10-16 ENCOUNTER — Encounter: Payer: Self-pay | Admitting: Vascular Surgery

## 2022-10-16 ENCOUNTER — Ambulatory Visit (HOSPITAL_COMMUNITY)
Admission: RE | Admit: 2022-10-16 | Discharge: 2022-10-16 | Disposition: A | Discharge status: Home / Self Care | Payer: Medicare Other | Source: Ambulatory Visit | Attending: Vascular Surgery | Admitting: Vascular Surgery

## 2022-10-16 ENCOUNTER — Ambulatory Visit (INDEPENDENT_AMBULATORY_CARE_PROVIDER_SITE_OTHER): Payer: Medicare Other | Admitting: Vascular Surgery

## 2022-10-16 ENCOUNTER — Other Ambulatory Visit: Payer: Self-pay

## 2022-10-16 VITALS — BP 105/69 | HR 91 | Temp 98.4°F | Resp 20 | Ht 69.0 in | Wt 178.6 lb

## 2022-10-16 DIAGNOSIS — N186 End stage renal disease: Secondary | ICD-10-CM | POA: Diagnosis not present

## 2022-10-16 DIAGNOSIS — I739 Peripheral vascular disease, unspecified: Secondary | ICD-10-CM

## 2022-10-16 DIAGNOSIS — I70222 Atherosclerosis of native arteries of extremities with rest pain, left leg: Secondary | ICD-10-CM

## 2022-10-16 NOTE — Progress Notes (Signed)
Patient ID: Duane Hall, male   DOB: 10-06-1956, 66 y.o.   MRN: [id number]  Reason for Consult: Follow-up   Referred by No ref. provider found  Subjective:     HPI:  Duane Hall is a 66 y.o. male with history of left SFA endovascular invention for heel ulcer which subsequently healed.  He does have a right lower extremity amputation he walks with the help of a walker but not very often.  He is on dialysis via right upper extremity AV fistula.  Now has new left great toe ulceration.  He presents here today with ABIs.  Past Medical History:  Diagnosis Date   Asthma    Cancer ([place])    Cataract    CKD (chronic kidney disease), stage III ([place])    Diabetes mellitus    Glaucoma    Vascular insufficiency 05/2020   Family History  Problem Relation Age of Onset   Cancer Father    Diabetes Mother    Past Surgical History:  Procedure Laterality Date   ABDOMINAL AORTOGRAM W/LOWER EXTREMITY N/A 06/19/2020   Procedure: ABDOMINAL AORTOGRAM W/LOWER EXTREMITY;  Surgeon: Waynetta Sandy, MD;  Location: [place] [facility];  Service: Cardiovascular;  Laterality: N/A;   ABDOMINAL AORTOGRAM W/LOWER EXTREMITY Left 10/16/2020   Procedure: ABDOMINAL AORTOGRAM W/LOWER EXTREMITY;  Surgeon: Waynetta Sandy, MD;  Location: [place] [facility];  Service: Cardiovascular;  Laterality: Left;   ABDOMINAL AORTOGRAM W/LOWER EXTREMITY N/A 12/25/2020   Procedure: ABDOMINAL AORTOGRAM W/LOWER EXTREMITY;  Surgeon: Waynetta Sandy, MD;  Location: [place] [facility];  Service: Cardiovascular;  Laterality: N/A;   AMPUTATION Right 01/12/2021   Procedure: 1ST AMPUTATION RAY;  Surgeon: [name], DPM;  Location: [place];  Service: Podiatry;  Laterality: Right;   AMPUTATION Right 03/13/2021   Procedure: RIGHT BELOW KNEE AMPUTATION;  Surgeon: Waynetta Sandy, MD;  Location: [place];  Service: Vascular;  Laterality: Right;   AV FISTULA PLACEMENT Right 09/14/2019   Procedure:  Right Arm Basilic Vein transposition;  Surgeon: Angelia Mould, MD;  Location: [place];  Service: Vascular;  Laterality: Right;   BONE BIOPSY Left 07/29/2020   Procedure: BONE BIOPSY;  Surgeon: [name], DPM;  Location: [place];  Service: Podiatry;  Laterality: Left;  Need bone trephines and/or large bore Giamshidi   COLONOSCOPY     COLONOSCOPY WITH PROPOFOL N/A 12/07/2021   Procedure: COLONOSCOPY WITH PROPOFOL;  Surgeon: Carol Ada, MD;  [facility];  Service: Endoscopy;  Laterality: N/A;   PERIPHERAL VASCULAR ATHERECTOMY Left 06/19/2020   Procedure: PERIPHERAL VASCULAR ATHERECTOMY;  Surgeon: Waynetta Sandy, MD;  Location: [place] [facility];  Service: Cardiovascular;  Laterality: Left;  SFA   PERIPHERAL VASCULAR ATHERECTOMY  12/25/2020   Procedure: PERIPHERAL VASCULAR ATHERECTOMY;  Surgeon: Waynetta Sandy, MD;  Location: [place] [facility];  Service: Cardiovascular;;  Lt. PT - Laser Lt. SFA - Laser   PERIPHERAL VASCULAR BALLOON ANGIOPLASTY  12/25/2020   Procedure: PERIPHERAL VASCULAR BALLOON ANGIOPLASTY;  Surgeon: Waynetta Sandy, MD;  Location: [place] [facility];  Service: Cardiovascular;;  Lt. SFA and PT   POLYPECTOMY  12/07/2021   Procedure: POLYPECTOMY;  Surgeon: Carol Ada, MD;  [facility];  Service: Endoscopy;;   UPPER GASTROINTESTINAL ENDOSCOPY  02/2019   Dr Ermelinda Das Social History:  Social History   Tobacco Use   Smoking status: Every Day    Packs/day: 0.25    Types:  Cigarettes   Smokeless tobacco: Never  Substance Use Topics   Alcohol use: No    Alcohol/week: 0.0 standard drinks of alcohol    Allergies  Allergen Reactions   Amoxicillin-Pot Clavulanate Diarrhea   Midodrine Other (See Comments)    Other reaction(s): Urinary Sensation   Tape Other (See Comments)    Latex band aids cause blistering    Current Outpatient Medications  Medication Sig Dispense Refill   aspirin EC 81 MG EC tablet  Take 1 tablet (81 mg total) by mouth daily. Swallow whole. (Patient taking differently: Take 81 mg by mouth every morning. Swallow whole.) 30 tablet 11   clopidogrel (PLAVIX) 75 MG tablet Take 1 tablet (75 mg total) by mouth daily with breakfast. (Patient taking differently: Take 75 mg by mouth every morning.) 30 tablet 3   collagenase (SANTYL) 250 UNIT/GM ointment Apply 1 Application topically daily. Wound measurements 3.0cm x 3.0cm 30 g 0   Continuous Blood Gluc Sensor (DEXCOM G6 SENSOR) MISC 1 Device by Does not apply route See admin instructions. Change every 10 days 9 each 3   Continuous Blood Gluc Transmit (DEXCOM G6 TRANSMITTER) MISC Change every 3 mos 1 each 3   docusate sodium (COLACE) 100 MG capsule Take 100 mg by mouth every morning.     doxycycline (VIBRA-TABS) 100 MG tablet Take 1 tablet (100 mg total) by mouth 2 (two) times daily. 20 tablet 0   Glucagon (GVOKE HYPOPEN 1-PACK) 1 MG/0.2ML SOAJ Inject 1 mg into the skin once as needed (low blood sugar).     Insulin Disposable Pump (OMNIPOD 5 G6 POD, GEN 5,) MISC 1 Device by Does not apply route every 3 (three) days. 30 each 3   insulin lispro (HUMALOG) 100 UNIT/ML injection For use in pump, total of 60 units per day 60 mL 3   iron sucrose in sodium chloride 0.9 % 100 mL Iron Sucrose (Venofer) Self Administer at Home     Methoxy PEG-Epoetin Beta (MIRCERA IJ) Inject into the skin.     multivitamin (RENA-VIT) TABS tablet Take 1 tablet by mouth every morning.     pantoprazole (PROTONIX) 40 MG tablet Take 1 tablet (40 mg total) by mouth daily.     rosuvastatin (CRESTOR) 10 MG tablet TAKE 1 TABLET BY MOUTH DAILY (Patient taking differently: Take 10 mg by mouth every morning.) 30 tablet 11   No current facility-administered medications for this visit.    Review of Systems  Constitutional:  Constitutional negative. HENT: HENT negative.  Eyes: Eyes negative.  Respiratory: Respiratory negative.  GI: Gastrointestinal negative.   Musculoskeletal: Positive for gait problem.  Skin: Positive for wound.  Hematologic: Hematologic/lymphatic negative.  Psychiatric: Psychiatric negative.        Objective:  Objective   Vitals:   10/16/22 0826  BP: 105/69  Pulse: 91  Resp: 20  Temp: 98.4 F (36.9 C)  SpO2: 98%  Weight: 178 lb 9.2 oz (81 kg)  Height: '5\' 9"'$  (1.753 m)   Body mass index is 26.37 kg/m.  Physical Exam HENT:     Head: Normocephalic.     Nose: Nose normal.  Eyes:     Pupils: Pupils are equal, round, and reactive to light.  Cardiovascular:     Pulses:          Femoral pulses are 2+ on the left side.      Popliteal pulses are 0 on the left side.       Dorsalis pedis pulses are 0 on the left  side.       Posterior tibial pulses are 0 on the left side.  Pulmonary:     Effort: Pulmonary effort is normal.  Abdominal:     General: Abdomen is flat.     Palpations: Abdomen is soft.  Musculoskeletal:     Comments: Right lower extremity prosthetic  Skin:    Comments: Left great toe with gangrene  Neurological:     Mental Status: He is alert.  Psychiatric:        Mood and Affect: Mood normal.        Behavior: Behavior normal.     Data: ABI Findings:  +-----+------------------+-----+--------+--------+  RightRt Pressure (mmHg)IndexWaveformComment   +-----+------------------+-----+--------+--------+  PTA                                 BKA       +-----+------------------+-----+--------+--------+  DP                                  BKA       +-----+------------------+-----+--------+--------+   +--------+------------------+-----+----------+-------+  Left    Lt Pressure (mmHg)IndexWaveform  Comment  +--------+------------------+-----+----------+-------+  [id number]                                       +--------+------------------+-----+----------+-------+  PTA     83                0.68 monophasic          +--------+------------------+-----+----------+-------+  DP      82                0.67 monophasic         +--------+------------------+-----+----------+-------+   +-------+-----------+-----------+------------+------------+  ABI/TBIToday's ABIToday's TBIPrevious ABIPrevious TBI  +-------+-----------+-----------+------------+------------+  Right  BKA        BKA        BKA         BKA           +-------+-----------+-----------+------------+------------+  Left   0.68       bandage    1.04        0.60          +-------+-----------+-----------+------------+------------+          Left ABIs appear decreased compared to prior study on 10/10/2021.     Summary:  Left: Resting left ankle-brachial index indicates moderate left lower  extremity arterial disease.      Assessment/Plan:    66 year old male with end-stage renal disease and right below-knee amputation with prosthetic in place.  From a prosthetic standpoint he needs more of the tight fitting component of the prosthesis and we can write a prescription for more and he can call to request.  He has left great toe ulceration consistent with critical left lower extremity limb threatening ischemia and we will plan for aortogram with left lower extremity angiogram from a right common femoral approach in the near future.  Patient dialyzes at home and will need to arrange around his Monday dialysis session and he demonstrates good understanding of this.  We did discuss the limb threatening nature of this and he demonstrates good understanding.     Waynetta Sandy MD [facility]

## 2022-10-16 NOTE — H&P (View-Only) (Signed)
Patient ID: Duane Hall, male   DOB: 18-Jan-1956, 66 y.o.   MRN: [id number]  Reason for Consult: Follow-up   Referred by No ref. provider found  Subjective:     HPI:  Duane Hall is a 66 y.o. male with history of left SFA endovascular invention for heel ulcer which subsequently healed.  He does have a right lower extremity amputation he walks with the help of a walker but not very often.  He is on dialysis via right upper extremity AV fistula.  Now has new left great toe ulceration.  He presents here today with ABIs.  Past Medical History:  Diagnosis Date   Asthma    Cancer ([place])    Cataract    CKD (chronic kidney disease), stage III ([place] Hall)    Diabetes mellitus    Glaucoma    Vascular insufficiency 05/2020   Family History  Problem Relation Age of Onset   Cancer Father    Diabetes Mother    Past Surgical History:  Procedure Laterality Date   ABDOMINAL AORTOGRAM W/LOWER EXTREMITY N/A 06/19/2020   Procedure: ABDOMINAL AORTOGRAM W/LOWER EXTREMITY;  Surgeon: Waynetta Sandy, MD;  Location: [place] [facility];  Service: Cardiovascular;  Laterality: N/A;   ABDOMINAL AORTOGRAM W/LOWER EXTREMITY Left 10/16/2020   Procedure: ABDOMINAL AORTOGRAM W/LOWER EXTREMITY;  Surgeon: Waynetta Sandy, MD;  Location: [place] [facility];  Service: Cardiovascular;  Laterality: Left;   ABDOMINAL AORTOGRAM W/LOWER EXTREMITY N/A 12/25/2020   Procedure: ABDOMINAL AORTOGRAM W/LOWER EXTREMITY;  Surgeon: Waynetta Sandy, MD;  Location: [place] [facility];  Service: Cardiovascular;  Laterality: N/A;   AMPUTATION Right 01/12/2021   Procedure: 1ST AMPUTATION RAY;  Surgeon: [name], DPM;  Location: [place];  Service: Podiatry;  Laterality: Right;   AMPUTATION Right 03/13/2021   Procedure: RIGHT BELOW KNEE AMPUTATION;  Surgeon: Waynetta Sandy, MD;  Location: [place];  Service: Vascular;  Laterality: Right;   AV FISTULA PLACEMENT Right 09/14/2019   Procedure:  Right Arm Basilic Vein transposition;  Surgeon: Angelia Mould, MD;  Location: [place];  Service: Vascular;  Laterality: Right;   BONE BIOPSY Left 07/29/2020   Procedure: BONE BIOPSY;  Surgeon: [name], DPM;  Location: [place];  Service: Podiatry;  Laterality: Left;  Need bone trephines and/or large bore Giamshidi   COLONOSCOPY     COLONOSCOPY WITH PROPOFOL N/A 12/07/2021   Procedure: COLONOSCOPY WITH PROPOFOL;  Surgeon: Carol Ada, MD;  [facility];  Service: Endoscopy;  Laterality: N/A;   PERIPHERAL VASCULAR ATHERECTOMY Left 06/19/2020   Procedure: PERIPHERAL VASCULAR ATHERECTOMY;  Surgeon: Waynetta Sandy, MD;  Location: [place] [facility];  Service: Cardiovascular;  Laterality: Left;  SFA   PERIPHERAL VASCULAR ATHERECTOMY  12/25/2020   Procedure: PERIPHERAL VASCULAR ATHERECTOMY;  Surgeon: Waynetta Sandy, MD;  Location: [place] [facility];  Service: Cardiovascular;;  Lt. PT - Laser Lt. SFA - Laser   PERIPHERAL VASCULAR BALLOON ANGIOPLASTY  12/25/2020   Procedure: PERIPHERAL VASCULAR BALLOON ANGIOPLASTY;  Surgeon: Waynetta Sandy, MD;  Location: [place] [facility];  Service: Cardiovascular;;  Lt. SFA and PT   POLYPECTOMY  12/07/2021   Procedure: POLYPECTOMY;  Surgeon: Carol Ada, MD;  [facility];  Service: Endoscopy;;   UPPER GASTROINTESTINAL ENDOSCOPY  02/2019   Duane Hall Social History:  Social History   Tobacco Use   Smoking status: Every Day    Packs/day: 0.25    Types:  Cigarettes   Smokeless tobacco: Never  Substance Use Topics   Alcohol use: No    Alcohol/week: 0.0 standard drinks of alcohol    Allergies  Allergen Reactions   Amoxicillin-Pot Clavulanate Diarrhea   Midodrine Other (See Comments)    Other reaction(s): Urinary Sensation   Tape Other (See Comments)    Latex band aids cause blistering    Current Outpatient Medications  Medication Sig Dispense Refill   aspirin EC 81 MG EC tablet  Take 1 tablet (81 mg total) by mouth daily. Swallow whole. (Patient taking differently: Take 81 mg by mouth every morning. Swallow whole.) 30 tablet 11   clopidogrel (PLAVIX) 75 MG tablet Take 1 tablet (75 mg total) by mouth daily with breakfast. (Patient taking differently: Take 75 mg by mouth every morning.) 30 tablet 3   collagenase (SANTYL) 250 UNIT/GM ointment Apply 1 Application topically daily. Wound measurements 3.0cm x 3.0cm 30 g 0   Continuous Blood Gluc Sensor (DEXCOM G6 SENSOR) MISC 1 Device by Does not apply route See admin instructions. Change every 10 days 9 each 3   Continuous Blood Gluc Transmit (DEXCOM G6 TRANSMITTER) MISC Change every 3 mos 1 each 3   docusate sodium (COLACE) 100 MG capsule Take 100 mg by mouth every morning.     doxycycline (VIBRA-TABS) 100 MG tablet Take 1 tablet (100 mg total) by mouth 2 (two) times daily. 20 tablet 0   Glucagon (GVOKE HYPOPEN 1-PACK) 1 MG/0.2ML SOAJ Inject 1 mg into the skin once as needed (low blood sugar).     Insulin Disposable Pump (OMNIPOD 5 G6 POD, GEN 5,) MISC 1 Device by Does not apply route every 3 (three) days. 30 each 3   insulin lispro (HUMALOG) 100 UNIT/ML injection For use in pump, total of 60 units per day 60 mL 3   iron sucrose in sodium chloride 0.9 % 100 mL Iron Sucrose (Venofer) Self Administer at Home     Methoxy PEG-Epoetin Beta (MIRCERA IJ) Inject into the skin.     multivitamin (RENA-VIT) TABS tablet Take 1 tablet by mouth every morning.     pantoprazole (PROTONIX) 40 MG tablet Take 1 tablet (40 mg total) by mouth daily.     rosuvastatin (CRESTOR) 10 MG tablet TAKE 1 TABLET BY MOUTH DAILY (Patient taking differently: Take 10 mg by mouth every morning.) 30 tablet 11   No current facility-administered medications for this visit.    Review of Systems  Constitutional:  Constitutional negative. HENT: HENT negative.  Eyes: Eyes negative.  Respiratory: Respiratory negative.  GI: Gastrointestinal negative.   Musculoskeletal: Positive for gait problem.  Skin: Positive for wound.  Hematologic: Hematologic/lymphatic negative.  Psychiatric: Psychiatric negative.        Objective:  Objective   Vitals:   10/16/22 0826  BP: 105/69  Pulse: 91  Resp: 20  Temp: 98.4 F (36.9 C)  SpO2: 98%  Weight: 178 lb 9.2 oz (81 kg)  Height: '5\' 9"'$  (1.753 m)   Body mass index is 26.37 kg/m.  Physical Exam HENT:     Head: Normocephalic.     Nose: Nose normal.  Eyes:     Pupils: Pupils are equal, round, and reactive to light.  Cardiovascular:     Pulses:          Femoral pulses are 2+ on the left side.      Popliteal pulses are 0 on the left side.       Dorsalis pedis pulses are 0 on the left  side.       Posterior tibial pulses are 0 on the left side.  Pulmonary:     Effort: Pulmonary effort is normal.  Abdominal:     General: Abdomen is flat.     Palpations: Abdomen is soft.  Musculoskeletal:     Comments: Right lower extremity prosthetic  Skin:    Comments: Left great toe with gangrene  Neurological:     Mental Status: He is alert.  Psychiatric:        Mood and Affect: Mood normal.        Behavior: Behavior normal.     Data: ABI Findings:  +-----+------------------+-----+--------+--------+  RightRt Pressure (mmHg)IndexWaveformComment   +-----+------------------+-----+--------+--------+  PTA                                 BKA       +-----+------------------+-----+--------+--------+  DP                                  BKA       +-----+------------------+-----+--------+--------+   +--------+------------------+-----+----------+-------+  Left    Lt Pressure (mmHg)IndexWaveform  Comment  +--------+------------------+-----+----------+-------+  [id number]                                       +--------+------------------+-----+----------+-------+  PTA     83                0.68 monophasic          +--------+------------------+-----+----------+-------+  DP      82                0.67 monophasic         +--------+------------------+-----+----------+-------+   +-------+-----------+-----------+------------+------------+  ABI/TBIToday's ABIToday's TBIPrevious ABIPrevious TBI  +-------+-----------+-----------+------------+------------+  Right  BKA        BKA        BKA         BKA           +-------+-----------+-----------+------------+------------+  Left   0.68       bandage    1.04        0.60          +-------+-----------+-----------+------------+------------+          Left ABIs appear decreased compared to prior study on 10/10/2021.     Summary:  Left: Resting left ankle-brachial index indicates moderate left lower  extremity arterial disease.      Assessment/Plan:    66 year old male with end-stage renal disease and right below-knee amputation with prosthetic in place.  From a prosthetic standpoint he needs more of the tight fitting component of the prosthesis and we can write a prescription for more and he can call to request.  He has left great toe ulceration consistent with critical left lower extremity limb threatening ischemia and we will plan for aortogram with left lower extremity angiogram from a right common femoral approach in the near future.  Patient dialyzes at home and will need to arrange around his Monday dialysis session and he demonstrates good understanding of this.  We did discuss the limb threatening nature of this and he demonstrates good understanding.     Waynetta Sandy MD [facility]

## 2022-10-21 ENCOUNTER — Encounter (HOSPITAL_COMMUNITY)
Admission: RE | Disposition: A | Discharge status: Home / Self Care | Payer: Self-pay | Source: Home / Self Care | Attending: Vascular Surgery

## 2022-10-21 ENCOUNTER — Ambulatory Visit (HOSPITAL_COMMUNITY)
Admission: RE | Admit: 2022-10-21 | Discharge: 2022-10-21 | Disposition: A | Discharge status: Home / Self Care | Payer: Medicare Other | Attending: Vascular Surgery | Admitting: Vascular Surgery

## 2022-10-21 ENCOUNTER — Other Ambulatory Visit: Payer: Self-pay

## 2022-10-21 DIAGNOSIS — I70222 Atherosclerosis of native arteries of extremities with rest pain, left leg: Secondary | ICD-10-CM

## 2022-10-21 DIAGNOSIS — L97529 Non-pressure chronic ulcer of other part of left foot with unspecified severity: Secondary | ICD-10-CM | POA: Insufficient documentation

## 2022-10-21 DIAGNOSIS — Z992 Dependence on renal dialysis: Secondary | ICD-10-CM | POA: Diagnosis not present

## 2022-10-21 DIAGNOSIS — Z794 Long term (current) use of insulin: Secondary | ICD-10-CM | POA: Insufficient documentation

## 2022-10-21 DIAGNOSIS — F17210 Nicotine dependence, cigarettes, uncomplicated: Secondary | ICD-10-CM | POA: Insufficient documentation

## 2022-10-21 DIAGNOSIS — Z89511 Acquired absence of right leg below knee: Secondary | ICD-10-CM | POA: Diagnosis not present

## 2022-10-21 DIAGNOSIS — I70245 Atherosclerosis of native arteries of left leg with ulceration of other part of foot: Secondary | ICD-10-CM | POA: Insufficient documentation

## 2022-10-21 DIAGNOSIS — E1151 Type 2 diabetes mellitus with diabetic peripheral angiopathy without gangrene: Secondary | ICD-10-CM | POA: Diagnosis present

## 2022-10-21 DIAGNOSIS — E1122 Type 2 diabetes mellitus with diabetic chronic kidney disease: Secondary | ICD-10-CM | POA: Insufficient documentation

## 2022-10-21 DIAGNOSIS — N186 End stage renal disease: Secondary | ICD-10-CM | POA: Diagnosis not present

## 2022-10-21 DIAGNOSIS — I70262 Atherosclerosis of native arteries of extremities with gangrene, left leg: Secondary | ICD-10-CM | POA: Diagnosis not present

## 2022-10-21 DIAGNOSIS — E11621 Type 2 diabetes mellitus with foot ulcer: Secondary | ICD-10-CM | POA: Insufficient documentation

## 2022-10-21 HISTORY — PX: PERIPHERAL VASCULAR BALLOON ANGIOPLASTY: CATH118281

## 2022-10-21 HISTORY — PX: PERIPHERAL VASCULAR INTERVENTION: CATH118257

## 2022-10-21 HISTORY — PX: ABDOMINAL AORTOGRAM W/LOWER EXTREMITY: CATH118223

## 2022-10-21 LAB — POCT I-STAT, CHEM 8
BUN: 26 mg/dL — ABNORMAL HIGH (ref 8–23)
Calcium, Ion: 1.1 mmol/L — ABNORMAL LOW (ref 1.15–1.40)
Chloride: 93 mmol/L — ABNORMAL LOW (ref 98–111)
Creatinine, Ser: 6.7 mg/dL — ABNORMAL HIGH (ref 0.61–1.24)
Glucose, Bld: 128 mg/dL — ABNORMAL HIGH (ref 70–99)
HCT: 31 % — ABNORMAL LOW (ref 39.0–52.0)
Hemoglobin: 10.5 g/dL — ABNORMAL LOW (ref 13.0–17.0)
Potassium: 3.6 mmol/L (ref 3.5–5.1)
Sodium: 137 mmol/L (ref 135–145)
TCO2: 33 mmol/L — ABNORMAL HIGH (ref 22–32)

## 2022-10-21 SURGERY — ABDOMINAL AORTOGRAM W/LOWER EXTREMITY
Anesthesia: LOCAL

## 2022-10-21 MED ORDER — LIDOCAINE HCL (PF) 1 % IJ SOLN
INTRAMUSCULAR | Status: DC | PRN
  Administered 2022-10-21: 20 mL via INTRADERMAL

## 2022-10-21 MED ORDER — SODIUM CHLORIDE 0.9 % IV SOLN: 250.0000 mL | INTRAVENOUS | Status: DC | PRN

## 2022-10-21 MED ORDER — MORPHINE SULFATE (PF) 2 MG/ML IV SOLN: 2.0000 mg | INTRAVENOUS | Status: DC | PRN

## 2022-10-21 MED ORDER — FENTANYL CITRATE (PF) 100 MCG/2ML IJ SOLN
INTRAMUSCULAR | Status: DC | PRN
  Administered 2022-10-21: 50 ug via INTRAVENOUS

## 2022-10-21 MED ORDER — OXYCODONE HCL 5 MG PO TABS: 5.0000 mg | ORAL_TABLET | ORAL | Status: DC | PRN

## 2022-10-21 MED ORDER — ONDANSETRON HCL 4 MG/2ML IJ SOLN: 4.0000 mg | Freq: Four times a day (QID) | INTRAMUSCULAR | Status: DC | PRN

## 2022-10-21 MED ORDER — LABETALOL HCL 5 MG/ML IV SOLN: 10.0000 mg | INTRAVENOUS | Status: DC | PRN

## 2022-10-21 MED ORDER — SODIUM CHLORIDE 0.9% FLUSH: 3.0000 mL | Freq: Two times a day (BID) | INTRAVENOUS | Status: DC

## 2022-10-21 MED ORDER — MIDAZOLAM HCL 2 MG/2ML IJ SOLN
INTRAMUSCULAR | Status: DC | PRN
  Administered 2022-10-21: 1 mg via INTRAVENOUS

## 2022-10-21 MED ORDER — ACETAMINOPHEN 325 MG PO TABS: 650.0000 mg | ORAL_TABLET | ORAL | Status: DC | PRN

## 2022-10-21 MED ORDER — HEPARIN SODIUM (PORCINE) 1000 UNIT/ML IJ SOLN
INTRAMUSCULAR | Status: AC
  Filled 2022-10-21: qty 10

## 2022-10-21 MED ORDER — HEPARIN (PORCINE) IN NACL 1000-0.9 UT/500ML-% IV SOLN
INTRAVENOUS | Status: DC | PRN
  Administered 2022-10-21 (×2): 500 mL

## 2022-10-21 MED ORDER — HYDRALAZINE HCL 20 MG/ML IJ SOLN: 5.0000 mg | INTRAMUSCULAR | Status: DC | PRN

## 2022-10-21 MED ORDER — HEPARIN SODIUM (PORCINE) 1000 UNIT/ML IJ SOLN
INTRAMUSCULAR | Status: DC | PRN
  Administered 2022-10-21: 5000 [IU] via INTRAVENOUS

## 2022-10-21 MED ORDER — MIDAZOLAM HCL 2 MG/2ML IJ SOLN
INTRAMUSCULAR | Status: AC
  Filled 2022-10-21: qty 2

## 2022-10-21 MED ORDER — SODIUM CHLORIDE 0.9% FLUSH: 3.0000 mL | INTRAVENOUS | Status: DC | PRN

## 2022-10-21 MED ORDER — IODIXANOL 320 MG/ML IV SOLN
INTRAVENOUS | Status: DC | PRN
  Administered 2022-10-21: 170 mL via INTRA_ARTERIAL

## 2022-10-21 MED ORDER — FENTANYL CITRATE (PF) 100 MCG/2ML IJ SOLN
INTRAMUSCULAR | Status: AC
  Filled 2022-10-21: qty 2

## 2022-10-21 MED ORDER — LIDOCAINE HCL (PF) 1 % IJ SOLN
INTRAMUSCULAR | Status: AC
  Filled 2022-10-21: qty 30

## 2022-10-21 MED ORDER — HEPARIN (PORCINE) IN NACL 1000-0.9 UT/500ML-% IV SOLN
INTRAVENOUS | Status: AC
  Filled 2022-10-21: qty 1000

## 2022-10-21 SURGICAL SUPPLY — 18 items
BALLN STERLING OTW 3X60X150 (BALLOONS) ×2
BALLN STERLING OTW 5X60X135 (BALLOONS) ×2
BALLOON STERLING OTW 3X60X150 (BALLOONS) IMPLANT
BALLOON STERLING OTW 5X60X135 (BALLOONS) IMPLANT
CATH OMNI FLUSH 5F 65CM (CATHETERS) IMPLANT
CLOSURE MYNX CONTROL 6F/7F (Vascular Products) IMPLANT
KIT ENCORE 26 ADVANTAGE (KITS) IMPLANT
KIT MICROPUNCTURE NIT STIFF (SHEATH) IMPLANT
KIT PV (KITS) ×2 IMPLANT
SHEATH CATAPULT 6FR 60 (SHEATH) IMPLANT
SHEATH PINNACLE 5F 10CM (SHEATH) IMPLANT
STENT ELUVIA 6X60X130 (Permanent Stent) IMPLANT
STENT SUPERA 5.0X80X120 (Permanent Stent) IMPLANT
SYR MEDRAD MARK V 150ML (SYRINGE) IMPLANT
TRANSDUCER W/STOPCOCK (MISCELLANEOUS) ×2 IMPLANT
TRAY PV CATH (CUSTOM PROCEDURE TRAY) ×2 IMPLANT
WIRE BENTSON .035X145CM (WIRE) IMPLANT
WIRE G V18X300CM (WIRE) IMPLANT

## 2022-10-21 NOTE — Op Note (Signed)
    Patient name: Duane Hall MRN: [id number] DOB: [birth year] Sex: male  10/21/2022 Pre-operative Diagnosis: Critical left lower extremity ischemia with toe ulceration Post-operative diagnosis:  Same Surgeon:  Erlene Quan C. Donzetta Matters, MD Procedure Performed: 1.  Ultrasound-guided cannulation right common femoral artery 2.  Aortogram 3.  Selection of the left common femoral artery and left lower extremity angiogram 4.  Stent of left popliteal artery with 5 x 80 mm Supera 5.  Stent of left SFA with 6 x 60 mm Elluvia 6.  Plain balloon angioplasty left tibioperoneal trunk with 3 mm balloon 7.  Moderate sedation with fentanyl and Versed for 49 minutes  Indications: 66 year old male with end-stage renal disease history of right below-knee amputation with the current prosthetic.  He has a history of left lower extremity intervention which did heal the heel ulcer.  He is now here with left great toe gangrene and is indicated for angiography with possible intervention.  Findings: The aorta was free of flow-limiting stenosis.  Left common iliac artery with approximately 30% stenosis also nonflow limiting.  Left common femoral artery was calcified with nonflow limiting stenosis.  Left SFA initially patent with 2 areas of 30% stenosis and an area of 95% stenosis which was stented to 0% residual.  Behind the knee the popliteal artery was 60% stenosed stented to 0% residual and at the TP trunk there was also approximately 50% stenosis stented to less than 20% residual with primary balloon angioplasty.  At completion there was a much improved posterior tibial signal at the ankle.  Plan will be to continue to watch his toe gangrene likely will need amputation but at this time he has deferred and he will follow-up in the office in about 1 month.   Procedure:  The patient was identified in the holding area and taken to room 8.  The patient was then placed supine on the table and prepped and draped in the usual  sterile fashion.  A time out was called.  Ultrasound was used to evaluate the right common femoral artery.  The area was anesthetized 1% lidocaine cannulated with micropuncture needle followed by wire and the sheath.  And images saved department record.  We placed a Bentson wire followed by a 5 [place] sheath.  Concomitantly we administered fentanyl and Versed and his vital signs were monitored throughout the case.  We placed an Omni catheter to L1 performed aortogram and cross the bifurcation with Bentson wire and Omni catheter and perform left lower extremity angiography.  With the above findings we then placed a long 6 French sheath in the left SFA and patient was administered 5000 units of heparin.  We then crossed the 3 areas of high-grade stenosis in the SFA, popliteal artery tibioperoneal trunk.  We then predilated the SFA and popliteal lesion with 5 mm balloon and primarily stented the left popliteal lesion with Supera followed by stenting of the SFA lesion with drug-eluting stent both of these were post -dilated millimeter balloon.  Completion demonstrated stenosis distally in the TP trunk and this was primarily ballooned with 3 mm balloon to less than 20% residual stenosis.  Satisfied with this we exchanged for a short 6 [place] sheath playmates device.  He tolerated procedure without any complication.  Contrast: 170 cc  Johsua Shevlin C. Donzetta Matters, MD [facility] Office: [phone] Pager: [phone]

## 2022-10-21 NOTE — Interval H&P Note (Signed)
History and Physical Interval Note:  10/21/2022 9:07 AM  Duane Hall  has presented today for surgery, with the diagnosis of critical limb ischemia of left lower extremity.  The various methods of treatment have been discussed with the patient and family. After consideration of risks, benefits and other options for treatment, the patient has consented to  Procedure(s): ABDOMINAL AORTOGRAM W/LOWER EXTREMITY (N/A) as a surgical intervention.  The patient's history has been reviewed, patient examined, no change in status, stable for surgery.  I have reviewed the patient's chart and labs.  Questions were answered to the patient's satisfaction.     [name]

## 2022-10-22 ENCOUNTER — Encounter (HOSPITAL_COMMUNITY): Payer: Self-pay | Admitting: Vascular Surgery

## 2022-10-24 ENCOUNTER — Other Ambulatory Visit: Payer: Self-pay

## 2022-10-24 DIAGNOSIS — E1122 Type 2 diabetes mellitus with diabetic chronic kidney disease: Secondary | ICD-10-CM

## 2022-10-24 MED ORDER — DEXCOM G6 SENSOR MISC: 1.0000 | 3 refills | Status: DC

## 2022-10-28 ENCOUNTER — Encounter: Payer: Self-pay | Admitting: Internal Medicine

## 2022-10-29 ENCOUNTER — Telehealth: Payer: Self-pay | Admitting: Podiatry

## 2022-10-29 ENCOUNTER — Ambulatory Visit (INDEPENDENT_AMBULATORY_CARE_PROVIDER_SITE_OTHER): Payer: Medicare Other | Admitting: Podiatry

## 2022-10-29 ENCOUNTER — Ambulatory Visit (INDEPENDENT_AMBULATORY_CARE_PROVIDER_SITE_OTHER): Payer: Medicare Other

## 2022-10-29 DIAGNOSIS — I739 Peripheral vascular disease, unspecified: Secondary | ICD-10-CM

## 2022-10-29 DIAGNOSIS — L97529 Non-pressure chronic ulcer of other part of left foot with unspecified severity: Secondary | ICD-10-CM | POA: Diagnosis not present

## 2022-10-29 DIAGNOSIS — I96 Gangrene, not elsewhere classified: Secondary | ICD-10-CM | POA: Diagnosis not present

## 2022-10-29 MED ORDER — DOXYCYCLINE HYCLATE 100 MG PO TABS: 100.0000 mg | ORAL_TABLET | Freq: Two times a day (BID) | ORAL | 0 refills | Status: DC

## 2022-10-29 NOTE — Progress Notes (Signed)
  Subjective:  Patient ID: Duane Hall, male    DOB: 23-Nov-1956,  MRN: [id number]  Chief Complaint  Patient presents with   Foot Ulcer    3 week follow up right foot    66 y.o. male presents with the above complaint. History confirmed with patient.  He recently underwent revascularization with Dr. Donzetta Matters.  He says the foot has felt a little bit more swollen  Objective:  Physical Exam: warm, good capillary refill, no trophic changes or ulcerative lesions, and DP and PT pulses are weak, his capillary fill time in the toe is improved, temperature is improved, overall and perfusion appears to be better the gangrenous patch still present, on debridement of this it shows necrotic exposed distal tuft phalanx   Left radiographs taken today show severely calcified vessels there is erosion of the distal tuft of the distal phalanx no evidence of erosion in the proximal phalanx  Assessment:   1. Dry gangrene ([place])   2. Skin ulcer of left great toe, unspecified ulcer stage ([place])   3. PAD (peripheral artery disease) ([place])       Plan:  Patient was evaluated and treated and all questions answered.  I reviewed my clinical exam findings with him I discussed that there is exposed distal phalanx and this is likely necrotic and infected at this point.  His x-rays show osteolysis of the distal tuft but no further spread.  I do think now is the proper time to plan for a partial toe amputation to eliminate the infected bone and gangrene.  He does have an increasing clinical appearance of the foot with improved perfusion through the toe.  We discussed the risk that this still could fail and he could end up with major amputation which is understandably upsetting but there are no guarantees with severe PAD like this.  I did review the angiography from Dr. Donzetta Matters and there is significantly increased flow through the PTA filling the angiosome to the great toe.  I think is her best chance at eliminating this issue  and proceed with wound healing.  We discussed the risk benefits and potential complications.  All questions were addressed.  Outpatient surgery for toe amputation partial of the left great toe will be scheduled for next week.   Surgical plan:  Procedure: -Left partial great toe amputation  Location: -[facility]  Anesthesia plan: -IV sedation with local  Postoperative pain plan: -Percocet 5/325 mg every 6 hours as needed pain  DVT prophylaxis: -None required  WB Restrictions / DME needs: -DVT in surgical shoe postop   No follow-ups on file.

## 2022-10-29 NOTE — Telephone Encounter (Addendum)
DOS: 11/08/2022  Medicare and BCBS  Procedure: Amputation Toe Interphalangeal Hallux Lt (50354)  DX: S56.812  Medicare Deductible: $226 with $0 remaining  Deductible: $500 with $0 remaining Out-of-Pocket: $3,000 with $0 remaining CoInsurance: 0%  BCBS does not require prior authorization since Medicare is the primary insurance per [employer].  Call Reference #: [id number]

## 2022-10-30 DIAGNOSIS — Z992 Dependence on renal dialysis: Secondary | ICD-10-CM | POA: Diagnosis not present

## 2022-10-30 DIAGNOSIS — E1129 Type 2 diabetes mellitus with other diabetic kidney complication: Secondary | ICD-10-CM | POA: Diagnosis not present

## 2022-10-30 DIAGNOSIS — N186 End stage renal disease: Secondary | ICD-10-CM | POA: Diagnosis not present

## 2022-10-31 NOTE — Progress Notes (Signed)
Sent message, via epic in basket, requesting order in epic from surgeon    10/31/22 1553  Preop Orders  Has preop orders? No  Name of staff/physician contacted for orders(Indicate phone or IB message) Lanae Crumbly, MD.

## 2022-11-06 NOTE — Patient Instructions (Signed)
SURGICAL WAITING ROOM VISITATION Patients having surgery or a procedure may have no more than 2 support people in the waiting area - these visitors may rotate.   Children under the age of 24 must have an adult with them who is not the patient. If the patient needs to stay at the hospital during part of their recovery, the visitor guidelines for inpatient rooms apply. Pre-op nurse will coordinate an appropriate time for 1 support person to accompany patient in pre-op.  This support person may not rotate.    Please refer to the [facility] website for the visitor guidelines for Inpatients (after your surgery is over and you are in a regular room).     Your procedure is scheduled on: 11/08/22   Report to [facility] Main Entrance    Report to admitting at 2:15 PM   Call this number if you have problems the morning of surgery [phone]   Do not eat food :After Midnight.   After Midnight you may have the following liquids until 1:15 PM DAY OF SURGERY  Water Non-Citrus Juices (without pulp, NO RED) Carbonated Beverages Black Coffee (NO MILK/CREAM OR CREAMERS, sugar ok)  Clear Tea (NO MILK/CREAM OR CREAMERS, sugar ok) regular and decaf                             Plain Jell-O (NO RED)                                           Fruit ices (not with fruit pulp, NO RED)                                     Popsicles (NO RED)                                                               Sports drinks like Gatorade (NO RED)         If you have questions, please contact your surgeon's office.  FOLLOW BOWEL PREP AND ANY ADDITIONAL PRE OP INSTRUCTIONS YOU RECEIVED FROM YOUR SURGEON'S OFFICE!!!     Oral Hygiene is also important to reduce your risk of infection.                                    Remember - BRUSH YOUR TEETH THE MORNING OF SURGERY WITH YOUR REGULAR TOOTHPASTE   Do NOT smoke after Midnight   Take these medicines the morning of surgery with A SIP OF WATER: Doxycycline,  Pantoprazole, Rosuvastatin   DO NOT TAKE ANY ORAL DIABETIC MEDICATIONS DAY OF YOUR SURGERY  How to Manage Your Diabetes Before and After Surgery  Why is it important to control my blood sugar before and after surgery? Improving blood sugar levels before and after surgery helps healing and can limit problems. A way of improving blood sugar control is eating a healthy diet by:  Eating less sugar and carbohydrates  Increasing activity/exercise  Talking with your  doctor about reaching your blood sugar goals High blood sugars (greater than 180 mg/dL) can raise your risk of infections and slow your recovery, so you will need to focus on controlling your diabetes during the weeks before surgery. Make sure that the doctor who takes care of your diabetes knows about your planned surgery including the date and location.  How do I manage my blood sugar before surgery? Check your blood sugar at least 4 times a day, starting 2 days before surgery, to make sure that the level is not too high or low. Check your blood sugar the morning of your surgery when you wake up and every 2 hours until you get to the Short Stay unit. If your blood sugar is less than 70 mg/dL, you will need to treat for low blood sugar: Do not take insulin. Treat a low blood sugar (less than 70 mg/dL) with  cup of clear juice (cranberry or apple), 4 glucose tablets, OR glucose gel. Recheck blood sugar in 15 minutes after treatment (to make sure it is greater than 70 mg/dL). If your blood sugar is not greater than 70 mg/dL on recheck, call [phone] for further instructions. Report your blood sugar to the short stay nurse when you get to Short Stay.  If you are admitted to the hospital after surgery: Your blood sugar will be checked by the staff and you will probably be given insulin after surgery (instead of oral diabetes medicines) to make sure you have good blood sugar levels. The goal for blood sugar control after surgery is  80-180 mg/dL.   WHAT DO I DO ABOUT MY DIABETES MEDICATION?    For patients with insulin pumps: Contact your diabetes doctor for specific instructions before surgery. Decrease basal rates by 50% at midnight the night before your surgery. Note that if your surgery is planned to be longer than 2 hours, your insulin pump will be removed and intravenous (IV) insulin will be started and managed by the nurses and the anesthesiologist. You will be able to restart your insulin pump once you are awake and able to manage it.  Make sure to bring insulin pump supplies to the hospital with you in case the  site needs to be changed.  Reviewed and Endorsed by [facility] Patient Education Committee, August 2015                              You may not have any metal on your body including hair pins, jewelry, and body piercing             Do not wear lotions, powders, perfumes, or deodorant  Do not shave  48 hours prior to surgery.    Do not bring valuables to the hospital. [place].  DO NOT [place]. PHARMACY WILL DISPENSE MEDICATIONS LISTED ON YOUR MEDICATION LIST TO YOU DURING YOUR ADMISSION [place]!    Patients discharged on the day of surgery will not be allowed to drive home.  Someone NEEDS to stay with you for the first 24 hours after anesthesia.              Please read over the following fact sheets you were given: IF [place] [phone] Hall    If you  received a COVID test during your pre-op visit  it is requested that you wear a mask when out in public, stay away from anyone that may not be feeling well and notify your surgeon if you develop symptoms. If you test positive for Covid or have been in contact with anyone that has tested positive in the last 10 days please notify you surgeon.    [facility] - Preparing for Surgery Before  surgery, you can play an important role.  Because skin is not sterile, your skin needs to be as free of germs as possible.  You can reduce the number of germs on your skin by washing with CHG (chlorahexidine gluconate) soap before surgery.  CHG is an antiseptic cleaner which kills germs and bonds with the skin to continue killing germs even after washing. Please DO NOT use if you have an allergy to CHG or antibacterial soaps.  If your skin becomes reddened/irritated stop using the CHG and inform your nurse when you arrive at Short Stay. Do not shave (including legs and underarms) for at least 48 hours prior to the first CHG shower.  You may shave your face/neck.  Please follow these instructions carefully:  1.  Shower with CHG Soap the night before surgery and the  morning of surgery.  2.  If you choose to wash your hair, wash your hair first as usual with your normal  shampoo.  3.  After you shampoo, rinse your hair and body thoroughly to remove the shampoo.                             4.  Use CHG as you would any other liquid soap.  You can apply chg directly to the skin and wash.  Gently with a scrungie or clean washcloth.  5.  Apply the CHG Soap to your body ONLY FROM THE NECK DOWN.   Do   not use on face/ open                           Wound or open sores. Avoid contact with eyes, ears mouth and   genitals (private parts).                       Wash face,  Genitals (private parts) with your normal soap.             6.  Wash thoroughly, paying special attention to the area where your    surgery  will be performed.  7.  Thoroughly rinse your body with warm water from the neck down.  8.  DO NOT shower/wash with your normal soap after using and rinsing off the CHG Soap.                9.  Pat yourself dry with a clean towel.            10.  Wear clean pajamas.            11.  Place clean sheets on your bed the night of your first shower and do not  sleep with pets. Day of Surgery : Do not apply  any lotions/deodorants the morning of surgery.  Please wear clean clothes to the hospital/surgery center.  FAILURE TO FOLLOW THESE INSTRUCTIONS MAY RESULT IN THE CANCELLATION OF YOUR SURGERY  PATIENT SIGNATURE_________________________________  NURSE SIGNATURE__________________________________  ________________________________________________________________________

## 2022-11-06 NOTE — Progress Notes (Addendum)
COVID Vaccine Completed: no  Date of COVID positive in last 90 days: no  PCP - Darel Hong, PA Cardiologist - n/a  Clearance by Adelene Amas 10/30/22 in Epic   Chest x-ray - n/a EKG - 10/21/22 Epic Stress Test - yes long time ago per pt, more than 5 ECHO - 07/29/20 Epic Cardiac Cath - n/a Pacemaker/ICD device last checked: n/a Spinal Cord Stimulator: n/a  Bowel Prep - no  Sleep Study - n/a CPAP -   Fasting Blood Sugar - 100 Checks Blood Sugar has dexcom Insulin pump- per DM coordinator decrease by 50% at mid night, pt  stated that he has never had to decrease his pump before for surgery and was upset by it.  Last dose of GLP1 agonist-  N/A GLP1 instructions:  N/A   Last dose of SGLT-2 inhibitors-  N/A SGLT-2 instructions: N/A   Blood Thinner Instructions: Plavix, not holding per surgeon (pt stated) Aspirin Instructions: ASA 81, not  holding per surgeon (pt stated) Last Dose:  Activity level: Can go up a flight of stairs and perform activities of daily living without stopping and without symptoms of chest pain. SOB with exertion     Anesthesia review: insulin pump, ESRD on hemodialysis, HTN, aortic atherosclerosis, PAD, emphysema, hep C, DM2, osteomyelitis, RBKA  Patient denies shortness of breath, fever, cough and chest pain at PAT appointment  Patient verbalized understanding of instructions that were given to them at the PAT appointment. Patient was also instructed that they will need to review over the PAT instructions again at home before surgery.

## 2022-11-07 ENCOUNTER — Encounter (HOSPITAL_COMMUNITY)
Admission: RE | Admit: 2022-11-07 | Discharge: 2022-11-07 | Disposition: A | Discharge status: Home / Self Care | Payer: Medicare Other | Source: Ambulatory Visit | Attending: Podiatry | Admitting: Podiatry

## 2022-11-07 ENCOUNTER — Encounter (HOSPITAL_COMMUNITY): Payer: Self-pay

## 2022-11-07 VITALS — BP 97/60 | HR 89 | Temp 98.3°F | Resp 14 | Ht 69.0 in | Wt 179.7 lb

## 2022-11-07 DIAGNOSIS — B182 Chronic viral hepatitis C: Secondary | ICD-10-CM | POA: Insufficient documentation

## 2022-11-07 DIAGNOSIS — Z01812 Encounter for preprocedural laboratory examination: Secondary | ICD-10-CM | POA: Diagnosis present

## 2022-11-07 DIAGNOSIS — E1165 Type 2 diabetes mellitus with hyperglycemia: Secondary | ICD-10-CM | POA: Diagnosis not present

## 2022-11-07 HISTORY — DX: Essential (primary) hypertension: I10

## 2022-11-07 HISTORY — DX: Inflammatory liver disease, unspecified: K75.9

## 2022-11-07 LAB — COMPREHENSIVE METABOLIC PANEL
ALT: 14 U/L (ref 0–44)
AST: 19 U/L (ref 15–41)
Albumin: 3.7 g/dL (ref 3.5–5.0)
Alkaline Phosphatase: 89 U/L (ref 38–126)
Anion gap: 11 (ref 5–15)
BUN: 26 mg/dL — ABNORMAL HIGH (ref 8–23)
CO2: 31 mmol/L (ref 22–32)
Calcium: 8.5 mg/dL — ABNORMAL LOW (ref 8.9–10.3)
Chloride: 93 mmol/L — ABNORMAL LOW (ref 98–111)
Creatinine, Ser: 5.99 mg/dL — ABNORMAL HIGH (ref 0.61–1.24)
GFR, Estimated: 10 mL/min — ABNORMAL LOW (ref >60)
Glucose, Bld: 134 mg/dL — ABNORMAL HIGH (ref 70–99)
Potassium: 3.3 mmol/L — ABNORMAL LOW (ref 3.5–5.1)
Sodium: 135 mmol/L (ref 135–145)
Total Bilirubin: 0.6 mg/dL (ref 0.3–1.2)
Total Protein: 7.2 g/dL (ref 6.5–8.1)

## 2022-11-07 LAB — CBC
HCT: 32.8 % — ABNORMAL LOW (ref 39.0–52.0)
Hemoglobin: 11 g/dL — ABNORMAL LOW (ref 13.0–17.0)
MCH: 32 pg (ref 26.0–34.0)
MCHC: 33.5 g/dL (ref 30.0–36.0)
MCV: 95.3 fL (ref 80.0–100.0)
Platelets: 177 10*3/uL (ref 150–400)
RBC: 3.44 MIL/uL — ABNORMAL LOW (ref 4.22–5.81)
RDW: 13.1 % (ref 11.5–15.5)
WBC: 8.9 10*3/uL (ref 4.0–10.5)
nRBC: 0 % (ref 0.0–0.2)

## 2022-11-07 LAB — HEMOGLOBIN A1C
Hgb A1c MFr Bld: 5.7 % — ABNORMAL HIGH (ref 4.8–5.6)
Mean Plasma Glucose: 116.89 mg/dL

## 2022-11-07 LAB — GLUCOSE, CAPILLARY: Glucose-Capillary: 130 mg/dL — ABNORMAL HIGH (ref 70–99)

## 2022-11-08 ENCOUNTER — Ambulatory Visit (HOSPITAL_BASED_OUTPATIENT_CLINIC_OR_DEPARTMENT_OTHER): Payer: Medicare Other | Admitting: Certified Registered Nurse Anesthetist

## 2022-11-08 ENCOUNTER — Ambulatory Visit (HOSPITAL_COMMUNITY)
Admission: RE | Admit: 2022-11-08 | Discharge: 2022-11-08 | Disposition: A | Discharge status: Home / Self Care | Payer: Medicare Other | Attending: Podiatry | Admitting: Podiatry

## 2022-11-08 ENCOUNTER — Ambulatory Visit (HOSPITAL_COMMUNITY): Payer: Medicare Other | Admitting: Physician Assistant

## 2022-11-08 ENCOUNTER — Encounter (HOSPITAL_COMMUNITY): Payer: Self-pay | Admitting: Podiatry

## 2022-11-08 ENCOUNTER — Other Ambulatory Visit: Payer: Self-pay

## 2022-11-08 ENCOUNTER — Encounter (HOSPITAL_COMMUNITY)
Admission: RE | Disposition: A | Discharge status: Home / Self Care | Payer: Self-pay | Source: Home / Self Care | Attending: Podiatry

## 2022-11-08 DIAGNOSIS — L03032 Cellulitis of left toe: Secondary | ICD-10-CM | POA: Insufficient documentation

## 2022-11-08 DIAGNOSIS — E1152 Type 2 diabetes mellitus with diabetic peripheral angiopathy with gangrene: Secondary | ICD-10-CM

## 2022-11-08 DIAGNOSIS — M86172 Other acute osteomyelitis, left ankle and foot: Secondary | ICD-10-CM | POA: Insufficient documentation

## 2022-11-08 DIAGNOSIS — Z794 Long term (current) use of insulin: Secondary | ICD-10-CM | POA: Diagnosis not present

## 2022-11-08 DIAGNOSIS — I10 Essential (primary) hypertension: Secondary | ICD-10-CM | POA: Insufficient documentation

## 2022-11-08 DIAGNOSIS — I96 Gangrene, not elsewhere classified: Secondary | ICD-10-CM | POA: Diagnosis present

## 2022-11-08 DIAGNOSIS — K219 Gastro-esophageal reflux disease without esophagitis: Secondary | ICD-10-CM | POA: Insufficient documentation

## 2022-11-08 DIAGNOSIS — M869 Osteomyelitis, unspecified: Secondary | ICD-10-CM

## 2022-11-08 DIAGNOSIS — E1169 Type 2 diabetes mellitus with other specified complication: Secondary | ICD-10-CM

## 2022-11-08 DIAGNOSIS — F17210 Nicotine dependence, cigarettes, uncomplicated: Secondary | ICD-10-CM | POA: Insufficient documentation

## 2022-11-08 HISTORY — PX: AMPUTATION TOE: SHX6595

## 2022-11-08 LAB — POCT I-STAT, CHEM 8
BUN: 40 mg/dL — ABNORMAL HIGH (ref 8–23)
Calcium, Ion: 1.07 mmol/L — ABNORMAL LOW (ref 1.15–1.40)
Chloride: 90 mmol/L — ABNORMAL LOW (ref 98–111)
Creatinine, Ser: 8.9 mg/dL — ABNORMAL HIGH (ref 0.61–1.24)
Glucose, Bld: 92 mg/dL (ref 70–99)
HCT: 32 % — ABNORMAL LOW (ref 39.0–52.0)
Hemoglobin: 10.9 g/dL — ABNORMAL LOW (ref 13.0–17.0)
Potassium: 3.5 mmol/L (ref 3.5–5.1)
Sodium: 135 mmol/L (ref 135–145)
TCO2: 33 mmol/L — ABNORMAL HIGH (ref 22–32)

## 2022-11-08 LAB — GLUCOSE, CAPILLARY
Glucose-Capillary: 97 mg/dL (ref 70–99)
Glucose-Capillary: 48 mg/dL — ABNORMAL LOW (ref 70–99)
Glucose-Capillary: 68 mg/dL — ABNORMAL LOW (ref 70–99)
Glucose-Capillary: 137 mg/dL — ABNORMAL HIGH (ref 70–99)
Glucose-Capillary: 66 mg/dL — ABNORMAL LOW (ref 70–99)

## 2022-11-08 SURGERY — AMPUTATION, TOE
Anesthesia: Monitor Anesthesia Care | Site: Toe | Laterality: Left

## 2022-11-08 MED ORDER — PROPOFOL 500 MG/50ML IV EMUL
INTRAVENOUS | Status: DC | PRN
  Administered 2022-11-08: 125 ug/kg/min via INTRAVENOUS

## 2022-11-08 MED ORDER — PHENYLEPHRINE 80 MCG/ML (10ML) SYRINGE FOR IV PUSH (FOR BLOOD PRESSURE SUPPORT)
PREFILLED_SYRINGE | INTRAVENOUS | Status: DC | PRN
  Administered 2022-11-08: 160 ug via INTRAVENOUS
  Administered 2022-11-08: 80 ug via INTRAVENOUS

## 2022-11-08 MED ORDER — CHLORHEXIDINE GLUCONATE 0.12 % MT SOLN
15.0000 mL | Freq: Once | OROMUCOSAL | Status: AC
  Administered 2022-11-08: 15 mL via OROMUCOSAL

## 2022-11-08 MED ORDER — ONDANSETRON HCL 4 MG/2ML IJ SOLN
INTRAMUSCULAR | Status: AC
  Filled 2022-11-08: qty 2

## 2022-11-08 MED ORDER — BUPIVACAINE HCL (PF) 0.5 % IJ SOLN
INTRAMUSCULAR | Status: AC
  Filled 2022-11-08: qty 30

## 2022-11-08 MED ORDER — VANCOMYCIN HCL 1000 MG IV SOLR
INTRAVENOUS | Status: AC
  Filled 2022-11-08: qty 20

## 2022-11-08 MED ORDER — DEXTROSE 50 % IV SOLN
INTRAVENOUS | Status: AC
  Filled 2022-11-08: qty 50

## 2022-11-08 MED ORDER — SODIUM CHLORIDE 0.9 % IV SOLN: INTRAVENOUS | Status: DC

## 2022-11-08 MED ORDER — ONDANSETRON HCL 4 MG/2ML IJ SOLN
INTRAMUSCULAR | Status: DC | PRN
  Administered 2022-11-08: 4 mg via INTRAVENOUS

## 2022-11-08 MED ORDER — PHENYLEPHRINE 80 MCG/ML (10ML) SYRINGE FOR IV PUSH (FOR BLOOD PRESSURE SUPPORT)
PREFILLED_SYRINGE | INTRAVENOUS | Status: AC
  Filled 2022-11-08: qty 10

## 2022-11-08 MED ORDER — DEXTROSE 50 % IV SOLN
25.0000 mL | Freq: Once | INTRAVENOUS | Status: AC
  Administered 2022-11-08: 25 mL via INTRAVENOUS

## 2022-11-08 MED ORDER — PROPOFOL 1000 MG/100ML IV EMUL
INTRAVENOUS | Status: AC
  Filled 2022-11-08: qty 100

## 2022-11-08 MED ORDER — BUPIVACAINE HCL (PF) 0.5 % IJ SOLN
INTRAMUSCULAR | Status: DC | PRN
  Administered 2022-11-08: 10 mL

## 2022-11-08 MED ORDER — PROMETHAZINE HCL 25 MG/ML IJ SOLN: 6.2500 mg | INTRAMUSCULAR | Status: DC | PRN

## 2022-11-08 MED ORDER — OXYCODONE HCL 5 MG/5ML PO SOLN: 5.0000 mg | Freq: Once | ORAL | Status: DC | PRN

## 2022-11-08 MED ORDER — 0.9 % SODIUM CHLORIDE (POUR BTL) OPTIME
TOPICAL | Status: DC | PRN
  Administered 2022-11-08: 1000 mL

## 2022-11-08 MED ORDER — PROPOFOL 10 MG/ML IV BOLUS
INTRAVENOUS | Status: DC | PRN
  Administered 2022-11-08: 30 mg via INTRAVENOUS

## 2022-11-08 MED ORDER — CEFAZOLIN SODIUM-DEXTROSE 2-4 GM/100ML-% IV SOLN
2.0000 g | INTRAVENOUS | Status: AC
  Administered 2022-11-08: 2 g via INTRAVENOUS
  Filled 2022-11-08: qty 100

## 2022-11-08 MED ORDER — FENTANYL CITRATE PF 50 MCG/ML IJ SOSY: 25.0000 ug | PREFILLED_SYRINGE | INTRAMUSCULAR | Status: DC | PRN

## 2022-11-08 MED ORDER — ACETAMINOPHEN 325 MG PO TABS: 325.0000 mg | ORAL_TABLET | ORAL | Status: DC | PRN

## 2022-11-08 MED ORDER — ACETAMINOPHEN 160 MG/5ML PO SOLN: 325.0000 mg | ORAL | Status: DC | PRN

## 2022-11-08 MED ORDER — ORAL CARE MOUTH RINSE: 15.0000 mL | Freq: Once | OROMUCOSAL | Status: AC

## 2022-11-08 MED ORDER — LACTATED RINGERS IV SOLN: INTRAVENOUS | Status: DC

## 2022-11-08 MED ORDER — OXYCODONE HCL 5 MG PO TABS: 5.0000 mg | ORAL_TABLET | Freq: Once | ORAL | Status: DC | PRN

## 2022-11-08 MED ORDER — ACETAMINOPHEN 10 MG/ML IV SOLN: 1000.0000 mg | Freq: Once | INTRAVENOUS | Status: DC | PRN

## 2022-11-08 SURGICAL SUPPLY — 40 items
BAG COUNTER SPONGE SURGICOUNT (BAG) IMPLANT
BLADE SURG 15 STRL LF DISP TIS (BLADE) IMPLANT
BLADE SURG 15 STRL SS (BLADE) ×1
BNDG CONFORM 4 STRL LF (GAUZE/BANDAGES/DRESSINGS) ×1 IMPLANT
BNDG ELASTIC 4X5.8 VLCR STR LF (GAUZE/BANDAGES/DRESSINGS) IMPLANT
BNDG ELASTIC 6X5.8 VLCR STR LF (GAUZE/BANDAGES/DRESSINGS) ×1 IMPLANT
BNDG GAUZE DERMACEA FLUFF 4 (GAUZE/BANDAGES/DRESSINGS) IMPLANT
CLEANER TIP ELECTROSURG 2X2 (MISCELLANEOUS) IMPLANT
CNTNR URN SCR LID CUP LEK RST (MISCELLANEOUS) IMPLANT
CONT SPEC 4OZ STRL OR WHT (MISCELLANEOUS) ×2
COVER SURGICAL LIGHT HANDLE (MISCELLANEOUS) ×1 IMPLANT
CUFF TOURN SGL QUICK 24 (TOURNIQUET CUFF)
CUFF TRNQT CYL 24X4X16.5-23 (TOURNIQUET CUFF) IMPLANT
DURAPREP 26ML APPLICATOR (WOUND CARE) IMPLANT
GAUZE SPONGE 4X4 12PLY STRL (GAUZE/BANDAGES/DRESSINGS) ×1 IMPLANT
GAUZE XEROFORM 1X8 LF (GAUZE/BANDAGES/DRESSINGS) ×1 IMPLANT
GLOVE BIO SURGEON STRL SZ7.5 (GLOVE) ×1 IMPLANT
GLOVE BIOGEL M 7.0 STRL (GLOVE) ×1 IMPLANT
GLOVE BIOGEL PI IND STRL 7.5 (GLOVE) ×1 IMPLANT
GLOVE BIOGEL PI IND STRL 8 (GLOVE) ×1 IMPLANT
GLOVE ECLIPSE 8.0 STRL XLNG CF (GLOVE) ×1 IMPLANT
GOWN STRL REUS W/ TWL XL LVL3 (GOWN DISPOSABLE) ×1 IMPLANT
GOWN STRL REUS W/TWL XL LVL3 (GOWN DISPOSABLE) ×1
KIT BASIN OR (CUSTOM PROCEDURE TRAY) ×1 IMPLANT
NDL HYPO 25X1 1.5 SAFETY (NEEDLE) ×1 IMPLANT
NEEDLE HYPO 25X1 1.5 SAFETY (NEEDLE) ×1 IMPLANT
PACK ORTHO EXTREMITY (CUSTOM PROCEDURE TRAY) ×1 IMPLANT
PADDING CAST COTTON 6X4 STRL (CAST SUPPLIES) IMPLANT
PADDING UNDERCAST 2X4 STRL (CAST SUPPLIES) ×1 IMPLANT
PENCIL SMOKE EVACUATOR (MISCELLANEOUS) ×1 IMPLANT
SPIKE FLUID TRANSFER (MISCELLANEOUS) IMPLANT
STAPLER VISISTAT 35W (STAPLE) ×1 IMPLANT
SUT ETHILON 3 0 PS 1 (SUTURE) IMPLANT
SUT ETHILON 4 0 PS 2 18 (SUTURE) ×1 IMPLANT
SUT MNCRL AB 3-0 PS2 18 (SUTURE) IMPLANT
SUT VIC AB 4-0 PS2 27 (SUTURE) ×1 IMPLANT
SYR 20ML LL LF (SYRINGE) IMPLANT
TOWEL OR 17X26 10 PK STRL BLUE (TOWEL DISPOSABLE) ×1 IMPLANT
TOWEL OR NON WOVEN STRL DISP B (DISPOSABLE) ×1 IMPLANT
UNDERPAD 30X36 HEAVY ABSORB (UNDERPADS AND DIAPERS) ×2 IMPLANT

## 2022-11-08 NOTE — Anesthesia Postprocedure Evaluation (Signed)
Anesthesia Post Note  Patient: Adnan Vanvoorhis [facility]  Procedure(s) Performed: AMPUTATION TOE (Left: Toe)     Patient location during evaluation: PACU Anesthesia Type: MAC Level of consciousness: awake and alert Pain management: pain level controlled Vital Signs Assessment: post-procedure vital signs reviewed and stable Respiratory status: spontaneous breathing, nonlabored ventilation, respiratory function stable and patient connected to nasal cannula oxygen Cardiovascular status: stable and blood pressure returned to baseline Postop Assessment: no apparent nausea or vomiting Anesthetic complications: no   No notable events documented.  Last Vitals:  Vitals:   11/08/22 1545 11/08/22 1600  BP: 113/68 114/61  Pulse: 85 85  Resp: 15 18  Temp:    SpO2: 100% 100%    Last Pain:  Vitals:   11/08/22 1600  TempSrc:   PainSc: 0-No pain                 Effie Berkshire

## 2022-11-08 NOTE — Anesthesia Preprocedure Evaluation (Addendum)
Anesthesia Evaluation  Patient identified by MRN, date of birth, ID band Patient awake    Reviewed: Allergy & Precautions, NPO status , Patient's Chart, lab work & pertinent test results  Airway Mallampati: III  TM Distance: >3 FB Neck ROM: Full    Dental  (+) Teeth Intact, Dental Advisory Given   Pulmonary asthma , COPD, Current Smoker and Patient abstained from smoking.   breath sounds clear to auscultation       Cardiovascular hypertension, + Peripheral Vascular Disease   Rhythm:Regular Rate:Normal     Neuro/Psych  negative psych ROS   GI/Hepatic ,GERD  Medicated,,(+) Hepatitis -, C  Endo/Other  diabetes, Type 2, Insulin Dependent    Renal/GU Renal disease     Musculoskeletal  (+) Arthritis ,    Abdominal   Peds  Hematology   Anesthesia Other Findings   Reproductive/Obstetrics                             Anesthesia Physical Anesthesia Plan  ASA: 3  Anesthesia Plan: MAC   Post-op Pain Management:    Induction: Intravenous  PONV Risk Score and Plan: 1 and Propofol infusion  Airway Management Planned: Natural Airway and Simple Face Mask  Additional Equipment: None  Intra-op Plan:   Post-operative Plan:   Informed Consent: I have reviewed the patients History and Physical, chart, labs and discussed the procedure including the risks, benefits and alternatives for the proposed anesthesia with the patient or authorized representative who has indicated his/her understanding and acceptance.       Plan Discussed with: CRNA  Anesthesia Plan Comments:        Anesthesia Quick Evaluation

## 2022-11-08 NOTE — H&P (Signed)
History and Physical Interval Note:  11/08/2022 1:38 PM  Duane Hall  has presented today for surgery, with the diagnosis of left great toe gangrene.  The various methods of treatment have been discussed with the patient and family. After consideration of risks, benefits and other options for treatment, the patient has consented to   Procedure(s) with comments: AMPUTATION TOE (Left) - DR Chenoah Mcnally WILL BLOCK, ON STRETCHER as a surgical intervention.  The patient's history has been reviewed, patient examined, no change in status, stable for surgery.  I have reviewed the patient's chart and labs.  Questions were answered to the patient's satisfaction.     [name]

## 2022-11-08 NOTE — Progress Notes (Signed)
Orthopedic Tech Progress Note Patient Details:  Duane Hall Duane Hall September 16, 1956 [id number]  Ortho Devices Type of Ortho Device: Postop shoe/boot Ortho Device/Splint Location: LLE Ortho Device/Splint Interventions: Application   Post Interventions Patient Tolerated: Well  Duane Hall 11/08/2022, 3:20 PM

## 2022-11-08 NOTE — Transfer of Care (Signed)
Immediate Anesthesia Transfer of Care Note  Patient: Quantavious Eggert [facility]  Procedure(s) Performed: AMPUTATION TOE (Left: Toe)  Patient Location: PACU  Anesthesia Type:MAC  Level of Consciousness: awake, alert , and oriented  Airway & Oxygen Therapy: Patient Spontanous Breathing and Patient connected to face mask oxygen  Post-op Assessment: Report given to RN, Post -op Vital signs reviewed and stable, and Patient moving all extremities X 4  Post vital signs: Reviewed and stable  Last Vitals:  Vitals Value Taken Time  BP 99/71   Temp    Pulse 77 11/08/22 1455  Resp 14 11/08/22 1455  SpO2 100 % 11/08/22 1455  Vitals shown include unvalidated device data.  Last Pain:  Vitals:   11/08/22 1352  TempSrc: Oral  PainSc:          Complications: No notable events documented.

## 2022-11-08 NOTE — Anesthesia Procedure Notes (Signed)
Procedure Name: MAC Date/Time: 11/08/2022 2:18 PM  Performed by: Maxwell Caul, CRNAPre-anesthesia Checklist: Patient identified, Emergency Drugs available, Suction available and Patient being monitored Oxygen Delivery Method: Simple face mask

## 2022-11-08 NOTE — Discharge Instructions (Addendum)
Post-Surgery Instructions  1. If you are recuperating from surgery anywhere other than home, please be sure to leave [place] a number where you can be reached. 2. Go directly home and rest. 3. The keep operated foot (or feet) elevated six inches above the hip when sitting or lying down. 4. Support the elevated foot and leg with pillows under the calf. DO NOT PLACE PILLOWS UNDER THE KNEE. 5. DO NOT REMOVE or get your bandages wet. This will increase your chances of getting an infection. 6. Wear your surgical shoe at all times when you are up. 7. A limited amount of pain and swelling may occur. The skin may take on a bruised appearance. This is no cause for alarm. 8. For slight pain and swelling, apply an ice pack directly over the bandage for 15 minutes every hour. Continue icing until seen in the office. DO NOT apply any form of heat to the area. 9. Have prescription(s) filled immediately and take as directed. 10. Drink lots of liquids, water, and juice. 11. CALL THE OFFICE IMMEDIATELY IF: a. Bleeding continues b. Pain increases and/or does not respond to medication c. Bandage or cast appears too tight d. Any liquids (water, coffee, etc.) have spilled on your bandages. e. Tripping, falling, or stubbing the surgical foot f. If your temperature rises above 101 g. If you have ANY questions at all 12. Please use the crutches, knee scooter, or walker you have prescribed, rented, or purchased. If you are non-weight bearing DO NOT put weight on the operated foot for _________ days. If you are weight-bearing, follow your physician's instructions. You are expected to be: ? weight-bearing ? non-weight bearing 13. Special Instructions: _____________________________________________________________ _________________________________________________________________________________ _________________________________________________________________________________  14. Your next appointment is: 11/14/2022  8:45 AM in [place]  If you need to reach the nurse for any reason, please call: [place]: [phone] [place]: [phone] [place]: [phone]

## 2022-11-08 NOTE — Op Note (Signed)
Patient Name: Duane Hall DOB: 1956/06/24  MRN: [id number]   Date of Service: 11/08/2022  Surgeon: Dr. Lanae Crumbly, DPM Assistants: None Pre-operative Diagnosis:  Left hallux gangrene PAD osteomyelitis Post-operative Diagnosis:  Left hallux gangrene PAD osteomyelitis Procedures:  1) amputation partial left great toe Pathology/Specimens: ID Type Source Tests Collected by Time Destination  1 : left toe Amputation Toe, Left SURGICAL PATHOLOGY [name], DPM 11/08/2022 1444   A : left toe Amputation Toe, Left AEROBIC/ANAEROBIC CULTURE W GRAM STAIN (SURGICAL/DEEP WOUND) [name], DPM 11/08/2022 1436    Anesthesia: MAC with local Hemostasis: none used Estimated Blood Loss: 10cc Materials: * No implants in log * Medications: 10cc Marcaine 3.8% plain Complications: none  Indications for Procedure:  This is a 66 y.o. male with a history of PAD with gangrene of the distal left hallux. After failing local wound care amputation was recommended for his osteomyelitis.   Procedure in Detail: Patient was identified in pre-operative holding area. Formal consent was signed and the left lower extremity was marked. Patient was brought back to the operating room. Anesthesia was induced. The extremity was prepped and draped in the usual sterile fashion. Timeout was taken to confirm patient name, laterality, and procedure prior to incision.   Attention was then directed to the left great toe where an incision was made around the interphalangeal joint to excise the gangrenous tissue. Dissection was carried down to level of bone.  Dissection was continued to the interphalangeal joint and all collateral ligaments were freed at the joint.  The bone soft tissue attachments of the distal phalanx were removed and passed for pathology.  The remaining proximal phalanx head appeared healthy and viable.  The area was copiously irrigated.  The soft tissue defect required further bone resection of  the distal proximal phalanx head. This was done with a bone nipper and sent for pathology. A bone culture was taken of the distal phalanx. The skin was reapproximated with Monocryl and nylon.  The foot was then dressed with xeroform and dry sterile dressing. Patient tolerated the procedure well.   Disposition: Following a period of post-operative monitoring, patient will be transferred to home.

## 2022-11-09 ENCOUNTER — Encounter (HOSPITAL_COMMUNITY): Payer: Self-pay | Admitting: Podiatry

## 2022-11-11 LAB — SURGICAL PATHOLOGY

## 2022-11-12 ENCOUNTER — Other Ambulatory Visit: Payer: Self-pay | Admitting: *Deleted

## 2022-11-12 DIAGNOSIS — I70222 Atherosclerosis of native arteries of extremities with rest pain, left leg: Secondary | ICD-10-CM

## 2022-11-13 LAB — AEROBIC/ANAEROBIC CULTURE W GRAM STAIN (SURGICAL/DEEP WOUND)
Culture: NO GROWTH
Gram Stain: NONE SEEN

## 2022-11-14 ENCOUNTER — Ambulatory Visit (INDEPENDENT_AMBULATORY_CARE_PROVIDER_SITE_OTHER): Payer: Medicare Other | Admitting: Podiatry

## 2022-11-14 DIAGNOSIS — I96 Gangrene, not elsewhere classified: Secondary | ICD-10-CM

## 2022-11-14 MED ORDER — DOXYCYCLINE HYCLATE 100 MG PO TABS: 100.0000 mg | ORAL_TABLET | Freq: Two times a day (BID) | ORAL | 0 refills | Status: DC

## 2022-11-14 NOTE — Progress Notes (Signed)
  Subjective:  Patient ID: Duane Hall, male    DOB: March 10, 1956,  MRN: [id number]  Chief Complaint  Patient presents with   Routine Post Op    POV #1 DOS 11/08/2022 PARTIAL GREAT TOE AMPUTATION LEFT GREAT TOE, per patient no concerns      66 y.o. male returns for post-op check.  Overall he is doing well he is using the surgical shoe  Review of Systems: Negative except as noted in the HPI. Denies N/V/F/Ch.   Objective:  There were no vitals filed for this visit. There is no height or weight on file to calculate BMI. Constitutional Well developed. Well nourished.  Vascular Foot warm and well perfused. Capillary refill normal to all digits.  Calf is soft and supple, no posterior calf or knee pain, negative Homans' sign  Neurologic Normal speech. Oriented to person, place, and time. Epicritic sensation to light touch grossly reduced bilaterally.  Dermatologic Incision is well coapted.  There is some bloody drainage.  Erythema to the level of the MPJ.  No purulence or malodor.  Some maceration lateral portion of incision  Orthopedic: He has no tenderness to palpation noted about the surgical site.   Assessment:   1. Dry gangrene ([place])    Plan:  Patient was evaluated and treated and all questions answered.  S/p foot surgery left -Progressing as expected post-operatively. -WB Status: WBAT in postop shoe.  He may weight-bear in a soft supportive shoe at home -Sutures: He will return in 2 weeks for suture removal.  Can return to regular shoe gear at that point full-time. -Medications: I placed him back on doxycycline.  He did have some erythema today, it appears to be more reactive as opposed to infectious. -Foot redressed.  Povidone ointment applied.  We will change the dressing every few days and inspect the incision and apply povidone ointment  Return in about 2 weeks (around 11/28/2022) for suture removal, post op (no x-rays).

## 2022-11-18 ENCOUNTER — Other Ambulatory Visit: Payer: Self-pay | Admitting: Podiatry

## 2022-11-18 ENCOUNTER — Ambulatory Visit (INDEPENDENT_AMBULATORY_CARE_PROVIDER_SITE_OTHER): Payer: Medicare Other | Admitting: Podiatry

## 2022-11-18 ENCOUNTER — Ambulatory Visit (INDEPENDENT_AMBULATORY_CARE_PROVIDER_SITE_OTHER): Payer: Medicare Other

## 2022-11-18 DIAGNOSIS — L97529 Non-pressure chronic ulcer of other part of left foot with unspecified severity: Secondary | ICD-10-CM

## 2022-11-19 NOTE — Progress Notes (Signed)
  Subjective:  Patient ID: Duane Hall, male    DOB: 1956-05-24,  MRN: [id number]  Chief Complaint  Patient presents with   Routine Post Op    Pt stated that he started feeling like his toe was swelling last night and he wanted  to have it checked     66 y.o. male returns for post-op check.  He was where the swelling was a sign of a worsening infection  Review of Systems: Negative except as noted in the HPI. Denies N/V/F/Ch.   Objective:  There were no vitals filed for this visit. There is no height or weight on file to calculate BMI. Constitutional Well developed. Well nourished.  Vascular Foot warm and well perfused. Capillary refill normal to all digits.  Calf is soft and supple, no posterior calf or knee pain, negative Homans' sign  Neurologic Normal speech. Oriented to person, place, and time. Epicritic sensation to light touch grossly reduced bilaterally.  Dermatologic Incision remains well coapted.  Maceration has resolved.  There is edema throughout the leg, no signs of DVT  Orthopedic: He has no tenderness to palpation noted about the surgical site.   Assessment:   1. Skin ulcer of left great toe, unspecified ulcer stage ([place])    Plan:  Patient was evaluated and treated and all questions answered.  S/p foot surgery left -Has some edema but I discussed with him this is expected after foot surgery and especially after combined foot and vascular procedures.  I do not see any signs of infection in his foot actually looks better than it did with minimal maceration today.  They will continue povidone dressing changes which we did today.  I will see him back as scheduled for suture removal  No follow-ups on file.

## 2022-11-20 ENCOUNTER — Encounter: Payer: Self-pay | Admitting: Physician Assistant

## 2022-11-20 ENCOUNTER — Ambulatory Visit (HOSPITAL_COMMUNITY)
Admission: RE | Admit: 2022-11-20 | Discharge: 2022-11-20 | Disposition: A | Discharge status: Home / Self Care | Payer: Medicare Other | Source: Ambulatory Visit | Attending: Vascular Surgery | Admitting: Vascular Surgery

## 2022-11-20 ENCOUNTER — Ambulatory Visit (INDEPENDENT_AMBULATORY_CARE_PROVIDER_SITE_OTHER): Payer: Medicare Other | Admitting: Physician Assistant

## 2022-11-20 ENCOUNTER — Ambulatory Visit (INDEPENDENT_AMBULATORY_CARE_PROVIDER_SITE_OTHER)
Admission: RE | Admit: 2022-11-20 | Discharge: 2022-11-20 | Disposition: A | Discharge status: Home / Self Care | Payer: Medicare Other | Source: Ambulatory Visit | Attending: Vascular Surgery | Admitting: Vascular Surgery

## 2022-11-20 VITALS — BP 108/60 | HR 84 | Temp 98.2°F | Ht 69.0 in | Wt 179.0 lb

## 2022-11-20 DIAGNOSIS — I739 Peripheral vascular disease, unspecified: Secondary | ICD-10-CM

## 2022-11-20 DIAGNOSIS — I70222 Atherosclerosis of native arteries of extremities with rest pain, left leg: Secondary | ICD-10-CM | POA: Insufficient documentation

## 2022-11-20 NOTE — Progress Notes (Signed)
[facility] [place] HISTORY AND PHYSICAL   History of Present Illness:  Patient is a 66 y.o. year old male who presents for evaluation of PAD.  He has a history of heel wound on the left LE that healed after SFA endovascular invention.  He recently developed left GT wound and is here for f/u s/p left popliteal artery stent, SFA stent and Plain balloon angioplasty left tibioperoneal trunk with 3 mm balloon.  He has a BKA by Dr. Donzetta Matters non healing wounds and failed revascularization.    The left GT amputation site is followed by [facility] Dr. Sherryle Lis.  The patient is satisfied and does not want me to change the dressing today.  He is minimal weight bearing in a post op shoe.  His wife is doing dressing changes.    He denies new non healing wounds.      Past Medical History:  Diagnosis Date   Asthma    Cancer ([place])    Cataract    CKD (chronic kidney disease), stage III ([place])    Diabetes mellitus    Glaucoma    Hepatitis    c   Hypertension    no longer   Vascular insufficiency 05/2020    Past Surgical History:  Procedure Laterality Date   ABDOMINAL AORTOGRAM W/LOWER EXTREMITY N/A 06/19/2020   Procedure: ABDOMINAL AORTOGRAM W/LOWER EXTREMITY;  Surgeon: Waynetta Sandy, MD;  Location: [place] [facility];  Service: Cardiovascular;  Laterality: N/A;   ABDOMINAL AORTOGRAM W/LOWER EXTREMITY Left 10/16/2020   Procedure: ABDOMINAL AORTOGRAM W/LOWER EXTREMITY;  Surgeon: Waynetta Sandy, MD;  Location: [place] [facility];  Service: Cardiovascular;  Laterality: Left;   ABDOMINAL AORTOGRAM W/LOWER EXTREMITY N/A 12/25/2020   Procedure: ABDOMINAL AORTOGRAM W/LOWER EXTREMITY;  Surgeon: Waynetta Sandy, MD;  Location: [place] [facility];  Service: Cardiovascular;  Laterality: N/A;   ABDOMINAL AORTOGRAM W/LOWER EXTREMITY N/A 10/21/2022   Procedure: ABDOMINAL AORTOGRAM W/LOWER EXTREMITY;  Surgeon: Waynetta Sandy, MD;  Location: [place] [facility];   Service: Cardiovascular;  Laterality: N/A;   AMPUTATION Right 01/12/2021   Procedure: 1ST AMPUTATION RAY;  Surgeon: [name], DPM;  Location: [facility];  Service: Podiatry;  Laterality: Right;   AMPUTATION Right 03/13/2021   Procedure: RIGHT BELOW KNEE AMPUTATION;  Surgeon: Waynetta Sandy, MD;  Location: [facility];  Service: Vascular;  Laterality: Right;   AMPUTATION TOE Left 11/08/2022   Procedure: AMPUTATION TOE;  Surgeon: [name], DPM;  Location: [facility];  Service: Podiatry;  Laterality: Left;  DR MCDONALD WILL BLOCK, ON STRETCHER   AV FISTULA PLACEMENT Right 09/14/2019   Procedure: Right Arm Basilic Vein transposition;  Surgeon: Angelia Mould, MD;  Location: [facility];  Service: Vascular;  Laterality: Right;   BONE BIOPSY Left 07/29/2020   Procedure: BONE BIOPSY;  Surgeon: [name], DPM;  Location: [facility];  Service: Podiatry;  Laterality: Left;  Need bone trephines and/or large bore Giamshidi   COLONOSCOPY     COLONOSCOPY WITH PROPOFOL N/A 12/07/2021   Procedure: COLONOSCOPY WITH PROPOFOL;  Surgeon: Carol Ada, MD;  [facility];  Service: Endoscopy;  Laterality: N/A;   PERIPHERAL VASCULAR ATHERECTOMY Left 06/19/2020   Procedure: PERIPHERAL VASCULAR ATHERECTOMY;  Surgeon: Waynetta Sandy, MD;  Location: [place] [facility];  Service: Cardiovascular;  Laterality: Left;  SFA   PERIPHERAL VASCULAR ATHERECTOMY  12/25/2020   Procedure: PERIPHERAL VASCULAR ATHERECTOMY;  Surgeon: Waynetta Sandy, MD;  Location: [place] [facility];  Service: Cardiovascular;;  Lt. PT - Laser Lt. SFA - Laser   PERIPHERAL VASCULAR BALLOON ANGIOPLASTY  12/25/2020   Procedure: PERIPHERAL VASCULAR BALLOON ANGIOPLASTY;  Surgeon: Waynetta Sandy, MD;  Location: [place] [facility];  Service: Cardiovascular;;  Lt. SFA and PT   PERIPHERAL VASCULAR BALLOON ANGIOPLASTY Left 10/21/2022   Procedure: PERIPHERAL VASCULAR BALLOON ANGIOPLASTY;  Surgeon: Waynetta Sandy, MD;  Location: [place] [facility];  Service: Cardiovascular;  Laterality: Left;   PERIPHERAL VASCULAR INTERVENTION Left 10/21/2022   Procedure: PERIPHERAL VASCULAR INTERVENTION;  Surgeon: Waynetta Sandy, MD;  Location: [place];  Service: Cardiovascular;  Laterality: Left;   POLYPECTOMY  12/07/2021   Procedure: POLYPECTOMY;  Surgeon: Carol Ada, MD;  [facility];  Service: Endoscopy;;   UPPER GASTROINTESTINAL ENDOSCOPY  02/2019   Dr Benson Norway     WISDOM TOOTH EXTRACTION      ROS:   General:  No weight loss, Fever, chills  HEENT: No recent headaches, no nasal bleeding, no visual changes, no sore throat  Neurologic: No dizziness, blackouts, seizures. No recent symptoms of stroke or mini- stroke. No recent episodes of slurred speech, or temporary blindness.  Cardiac: No recent episodes of chest pain/pressure, no shortness of breath at rest.  No shortness of breath with exertion.  Denies history of atrial fibrillation or irregular heartbeat  Vascular: No history of rest pain in feet.  No history of claudication.  No history of non-healing ulcer, positive history of DVT   Pulmonary: No home oxygen, no productive cough, no hemoptysis,  No asthma or wheezing  Musculoskeletal:  '[ ]'$  Arthritis, '[ ]'$  Low back pain,  '[ ]'$  Joint pain  Hematologic:No history of hypercoagulable state.  No history of easy bleeding.  No history of anemia  Gastrointestinal: No hematochezia or melena,  No gastroesophageal reflux, no trouble swallowing  Urinary: '[ ]'$  chronic Kidney disease, [ x] on HD - '[ ]'$  MWF or '[ ]'$  TTHS, '[ ]'$  Burning with urination, '[ ]'$  Frequent urination, '[ ]'$  Difficulty urinating;   Skin: No rashes  Psychological: No history of anxiety,  No history of depression  Social History Social History   Tobacco Use   Smoking status: Every Day    Packs/day: 0.50    Types: Cigarettes   Smokeless tobacco: Never  Vaping Use   Vaping Use: Never used   Substance Use Topics   Alcohol use: No    Alcohol/week: 0.0 standard drinks of alcohol   Drug use: No    Family History Family History  Problem Relation Age of Onset   Cancer Father    Diabetes Mother     Allergies  Allergies  Allergen Reactions   Amoxicillin-Pot Clavulanate Diarrhea   Midodrine Other (See Comments)    Other reaction(s): Urinary Sensation   Tape Other (See Comments)    Latex band aids cause blistering     Current Outpatient Medications  Medication Sig Dispense Refill   aspirin EC 81 MG EC tablet Take 1 tablet (81 mg total) by mouth daily. Swallow whole. 30 tablet 11   clopidogrel (PLAVIX) 75 MG tablet Take 1 tablet (75 mg total) by mouth daily with breakfast. 30 tablet 3   Continuous Blood Gluc Sensor (DEXCOM G6 SENSOR) MISC 1 Device by Does not apply route See admin instructions. Change every 10 days 9 each 3   Continuous Blood Gluc Transmit (DEXCOM G6 TRANSMITTER) MISC Change every 3 mos 1 each 3   doxycycline (VIBRA-TABS) 100 MG tablet Take  1 tablet (100 mg total) by mouth 2 (two) times daily. 20 tablet 0   Insulin Disposable Pump (OMNIPOD 5 G6 POD, GEN 5,) MISC 1 Device by Does not apply route every 3 (three) days. 30 each 3   insulin lispro (HUMALOG) 100 UNIT/ML injection For use in pump, total of 60 units per day 60 mL 3   iron sucrose in sodium chloride 0.9 % 100 mL Inject into the vein daily as needed (low iron).     Methoxy PEG-Epoetin Beta (MIRCERA IJ) Inject into the skin.     pantoprazole (PROTONIX) 40 MG tablet Take 1 tablet (40 mg total) by mouth daily.     rosuvastatin (CRESTOR) 10 MG tablet TAKE 1 TABLET BY MOUTH DAILY 30 tablet 11   No current facility-administered medications for this visit.    Physical Examination  Vitals:   11/20/22 1304  BP: 108/60  Pulse: 84  Temp: 98.2 F (36.8 C)  TempSrc: Temporal  SpO2: 99%  Weight: 179 lb (81.2 kg)  Height: '5\' 9"'$  (1.753 m)    Body mass index is 26.43 kg/m.  General:  Alert and  oriented, no acute distress HEENT: Normal Neck: No bruit or JVD Pulmonary: Clear to auscultation bilaterally Cardiac: Regular Rate and Rhythm without murmur Abdomen: Soft, non-tender, non-distended, no mass, no scars Skin: No rash Musculoskeletal: No deformity or edema  Neurologic: Upper and lower extremity motor 5/5 and symmetric  DATA:  Left SFA Stent(s):  +---------------+--------+--------+--------+--------+  SFA stent      PSV cm/sStenosisWaveformComments  +---------------+--------+--------+--------+--------+  Prox to Stent  145             biphasic          +---------------+--------+--------+--------+--------+  Proximal Stent 204             biphasic          +---------------+--------+--------+--------+--------+  Mid Stent      119             biphasic          +---------------+--------+--------+--------+--------+  Distal Stent   140             biphasic          +---------------+--------+--------+--------+--------+  Distal to Stent121             biphasic          +---------------+--------+--------+--------+--------+            +--------+--------+-----+---------------+--------+--------+  LEFT   PSV cm/sRatioStenosis       WaveformComments  +--------+--------+-----+---------------+--------+--------+  CFA Prox126                         biphasic          +--------+--------+-----+---------------+--------+--------+  DFA    229                         biphasic          +--------+--------+-----+---------------+--------+--------+  SFA Prox307          50-74% stenosisbiphasic          +--------+--------+-----+---------------+--------+--------+  SFA Mid 192                         biphasic          +--------+--------+-----+---------------+--------+--------+  POP Mid  stent     +--------+--------+-----+---------------+--------+--------+     Left Popliteal  Stent(s):  +---------------+--------+--------+--------+--------+  Popliteal stentPSV cm/sStenosisWaveformComments  +---------------+--------+--------+--------+--------+  Prox to Stent  64              biphasic          +---------------+--------+--------+--------+--------+  Proximal Stent 120             biphasic          +---------------+--------+--------+--------+--------+  Mid Stent      72              biphasic          +---------------+--------+--------+--------+--------+  Distal Stent   120             biphasic          +---------------+--------+--------+--------+--------+  Distal to Stent85              biphasic          +---------------+--------+--------+--------+--------+        Summary:  Left: 50-74% stenosis noted in the proximal superficial femoral artery.  Calcific and noncalcific plaque throughout the left lower extremity  obscures thorough visualization of the SFA stent.  The popliteal stent appears patent with no visualized stenosis.  Limited visualization of the SFA stent, appears patent   ABI Findings:  +--------+------------------+-----+--------+---------+  Right  Rt Pressure (mmHg)IndexWaveformComment    +--------+------------------+-----+--------+---------+  Brachial                              AVF / BKA  +--------+------------------+-----+--------+---------+   +---------+------------------+-----+----------+---------+  Left    Lt Pressure (mmHg)IndexWaveform  Comment    +---------+------------------+-----+----------+---------+  Brachial 120                                         +---------+------------------+-----+----------+---------+  PTA     132               1.10 biphasic             +---------+------------------+-----+----------+---------+  DP      108               0.90 monophasic           +---------+------------------+-----+----------+---------+  Great Toe                                  amputated  +---------+------------------+-----+----------+---------+   +-------+-----------+-----------+------------+------------+  ABI/TBIToday's ABIToday's TBIPrevious ABIPrevious TBI  +-------+-----------+-----------+------------+------------+  Right BKA        -          BKA                       +-------+-----------+-----------+------------+------------+  Left  1.10       -          0.68        bandage       +-------+-----------+-----------+------------+------------+    Summary:  Left: Resting left ankle-brachial index is within normal range. Left Great  toe amputated.    ASSESSMENT/PLAN:  Patient is a 66 y.o. year old male who presents for evaluation of PAD.  He has a history of heel wound on the left LE that healed after SFA endovascular invention.  He recently developed left  GT wound and is here for f/u s/p left popliteal artery stent, SFA stent and Plain balloon angioplasty left tibioperoneal trunk with 3 mm balloon.  He has a BKA by Dr. Donzetta Matters non healing wounds and failed revascularization.   His ABI's have improved from 0.68 to 1.1 and the stents are patent.  With popliteal biphasic wave forms, proximal SFA stenosis 50-74%.  He states that Dr. Sherryle Lis is pleased with the healing and he will return next week for suture removal.  His wife said she will take a picture when she changes it tomorrow and send it to our office through the my chart app. He will f/u in 6 months for repeat studies.  If they have concerns they will call.        Roxy Horseman PA-C [facility] Office: [phone]  MD in clinic [place]

## 2022-11-27 ENCOUNTER — Other Ambulatory Visit: Payer: Self-pay

## 2022-11-27 DIAGNOSIS — I739 Peripheral vascular disease, unspecified: Secondary | ICD-10-CM

## 2022-11-27 DIAGNOSIS — I70222 Atherosclerosis of native arteries of extremities with rest pain, left leg: Secondary | ICD-10-CM

## 2022-11-28 ENCOUNTER — Other Ambulatory Visit: Payer: Medicare Other

## 2022-11-28 ENCOUNTER — Ambulatory Visit (INDEPENDENT_AMBULATORY_CARE_PROVIDER_SITE_OTHER): Payer: Medicare Other | Admitting: Podiatry

## 2022-11-28 ENCOUNTER — Encounter: Payer: Medicare Other | Admitting: Podiatry

## 2022-11-28 ENCOUNTER — Encounter: Payer: Self-pay | Admitting: Podiatry

## 2022-11-28 DIAGNOSIS — L97529 Non-pressure chronic ulcer of other part of left foot with unspecified severity: Secondary | ICD-10-CM

## 2022-11-28 NOTE — Progress Notes (Signed)
  Subjective:  Patient ID: Duane Hall, male    DOB: 1956/07/12,  MRN: [id number]  Chief Complaint  Patient presents with   Routine Post Op    POV #2 DOS 11/08/2022 PARTIAL GREAT TOE AMPUTATION LEFT GREAT TOE     66 y.o. male returns for post-op check.      Review of Systems: Negative except as noted in the HPI. Denies N/V/F/Ch.   Objective:  There were no vitals filed for this visit. There is no height or weight on file to calculate BMI. Constitutional Well developed. Well nourished.  Vascular Foot warm and well perfused. Capillary refill normal to all digits.  Calf is soft and supple, no posterior calf or knee pain, negative Homans' sign  Neurologic Normal speech. Oriented to person, place, and time. Epicritic sensation to light touch grossly reduced bilaterally.  Dermatologic Incision remains well coapted.  There is edema throughout the leg, no signs of DVT.  No signs of infection  Orthopedic: He has no tenderness to palpation noted about the surgical site.   Assessment:   1. Skin ulcer of left great toe, unspecified ulcer stage ([place])    Plan:  Patient was evaluated and treated and all questions answered.  S/p foot surgery left -Most sutures removed today.  There still is an area of delayed healing laterally.  Expect this will need debridement at the next visit.  May be WBAT in regular shoes at this point  No follow-ups on file.

## 2022-11-29 DIAGNOSIS — Z992 Dependence on renal dialysis: Secondary | ICD-10-CM | POA: Diagnosis not present

## 2022-11-29 DIAGNOSIS — E1129 Type 2 diabetes mellitus with other diabetic kidney complication: Secondary | ICD-10-CM | POA: Diagnosis not present

## 2022-11-29 DIAGNOSIS — N186 End stage renal disease: Secondary | ICD-10-CM | POA: Diagnosis not present

## 2022-12-03 ENCOUNTER — Ambulatory Visit: Payer: Medicare Other | Admitting: Podiatry

## 2022-12-10 ENCOUNTER — Ambulatory Visit (INDEPENDENT_AMBULATORY_CARE_PROVIDER_SITE_OTHER): Payer: Medicare Other

## 2022-12-10 ENCOUNTER — Ambulatory Visit (INDEPENDENT_AMBULATORY_CARE_PROVIDER_SITE_OTHER): Payer: Medicare Other | Admitting: Podiatry

## 2022-12-10 DIAGNOSIS — Z9889 Other specified postprocedural states: Secondary | ICD-10-CM

## 2022-12-10 DIAGNOSIS — L97529 Non-pressure chronic ulcer of other part of left foot with unspecified severity: Secondary | ICD-10-CM

## 2022-12-12 NOTE — Progress Notes (Signed)
  Subjective:  Patient ID: Duane Hall, male    DOB: 14-Sep-1956,  MRN: [id number]  Chief Complaint  Patient presents with   Routine Post Op    Rm 4  POV #3 DOS 11/08/2022 PARTIAL GREAT TOE AMPUTATION LEFT GREAT TOE. Pt states he is doing well. In office today for debridement and possible suture removal.      66 y.o. male returns for post-op check.      Review of Systems: Negative except as noted in the HPI. Denies N/V/F/Ch.   Objective:  There were no vitals filed for this visit. There is no height or weight on file to calculate BMI. Constitutional Well developed. Well nourished.  Vascular Foot warm and well perfused. Capillary refill normal to all digits.  Calf is soft and supple, no posterior calf or knee pain, negative Homans' sign  Neurologic Normal speech. Oriented to person, place, and time. Epicritic sensation to light touch grossly reduced bilaterally.  Dermatologic Sutures removed today shows a residual ulceration measures 1.0 x 0.5 x 0.3 cm no signs of infection no purulence exposed subcutaneous tissue  Orthopedic: He has no tenderness to palpation noted about the surgical site.     Assessment:   1. Skin ulcer of left great toe, unspecified ulcer stage ([place])   2. Post-operative state    Plan:  Patient was evaluated and treated and all questions answered.  S/p foot surgery left -Remaining sutures were removed today.  It shows a full-thickness residual ulceration measuring 1.0 x 0.5 x 0.3 cm.  Exposed subcutaneous tissue.  This was debrided in a full-thickness excisional manner with a sharp scalpel to tolerance.  Hemostasis was achieved and Prisma collagen dressings were applied.  He will continue these at home.  Order was placed for [facility] service to deliver these.  Return in about 3 weeks (around 12/31/2022) for wound care.

## 2022-12-16 ENCOUNTER — Other Ambulatory Visit: Payer: Self-pay

## 2022-12-16 DIAGNOSIS — E1122 Type 2 diabetes mellitus with diabetic chronic kidney disease: Secondary | ICD-10-CM

## 2022-12-16 MED ORDER — OMNIPOD 5 DEXG7G6 PODS GEN 5 MISC: 1.0000 | 3 refills | Status: DC

## 2022-12-16 MED ORDER — DEXCOM G6 SENSOR MISC: 1.0000 | 3 refills | Status: DC

## 2022-12-19 ENCOUNTER — Encounter: Payer: Medicare Other | Admitting: Podiatry

## 2022-12-31 ENCOUNTER — Ambulatory Visit (INDEPENDENT_AMBULATORY_CARE_PROVIDER_SITE_OTHER): Payer: Medicare Other | Admitting: Podiatry

## 2022-12-31 DIAGNOSIS — T8131XA Disruption of external operation (surgical) wound, not elsewhere classified, initial encounter: Secondary | ICD-10-CM

## 2022-12-31 DIAGNOSIS — L97529 Non-pressure chronic ulcer of other part of left foot with unspecified severity: Secondary | ICD-10-CM

## 2022-12-31 MED ORDER — GENTAMICIN SULFATE 0.1 % EX OINT: 1.0000 | TOPICAL_OINTMENT | Freq: Three times a day (TID) | CUTANEOUS | 0 refills | Status: DC

## 2023-01-01 ENCOUNTER — Telehealth: Payer: Self-pay | Admitting: *Deleted

## 2023-01-01 NOTE — Telephone Encounter (Signed)
Spoke with the wife giving instructions to use the ointment prescribed once daily instead of 3 times daily. She verbalized understanding.

## 2023-01-01 NOTE — Progress Notes (Signed)
  Subjective:  Patient ID: Duane Hall, male    DOB: December 05, 1956,  MRN: [id number]  Chief Complaint  Patient presents with   Routine Post Op    Rm 4 POV #4 DOS 11/08/2022 PARTIAL GREAT TOE AMPUTATION LEFT GREAT TOE. Pt states he is doing well. No nv, fever or chills. No signs of infection. No concerns.      67 y.o. male returns for post-op check.      Review of Systems: Negative except as noted in the HPI. Denies N/V/F/Ch.   Objective:  There were no vitals filed for this visit. There is no height or weight on file to calculate BMI. Constitutional Well developed. Well nourished.  Vascular Foot warm and well perfused. Capillary refill normal to all digits.  Calf is soft and supple, no posterior calf or knee pain, negative Homans' sign  Neurologic Normal speech. Oriented to person, place, and time. Epicritic sensation to light touch grossly reduced bilaterally.  Dermatologic Area of delayed healing continues to worsen and expand laterally and now there is a new site on the medial side, has minimal bleeding on debridement  Orthopedic: He has no tenderness to palpation noted about the surgical site.     Assessment:   1. Dehiscence of operative wound, initial encounter   2. Skin ulcer of left great toe, unspecified ulcer stage ([place])    Plan:  Patient was evaluated and treated and all questions answered.  S/p foot surgery left -Unfortunately so far has continued to worsen and the area of delayed healing has expanded and shows devitalized tissue.  I recommend revision amputation with removal of the remainder of the great toe and possible partial first ray amputation to get to an area for viable bleeding.  We discussed the risk benefits symptoms complications of this.  We discussed the possibility of failure and further limb loss.  Unfortunately no guarantees can be made at this point with his severe PAD.  All questions were addressed.  Informed consent was signed and reviewed.   Outpatient surgery for this will be scheduled for next week.   Surgical plan:  Procedure: -Left foot total great toe versus partial first ray amputation  Location: -[place]  Anesthesia plan: -MAC with local  Postoperative pain plan: - Tylenol 1000 mg every 6 hours, oxycodone 5 mg 1-2 tabs every 6 hours only as needed  DVT prophylaxis: -None required  WB Restrictions / DME needs: -WBAT in surgical shoe postop   No follow-ups on file.

## 2023-01-01 NOTE — Telephone Encounter (Signed)
Patient is calling to ask how to apply the medication prescribed, should he be applying to both wounds(not in notes),was not given any instructions, explained that he should be applying 3 times daily ,please advise further recommendations.

## 2023-01-02 ENCOUNTER — Telehealth: Payer: Self-pay | Admitting: Podiatry

## 2023-01-02 NOTE — Telephone Encounter (Signed)
DOS: 01/09/2023  Medicare Part A & B BCBS Effective 12/30/2022  Amputation Toe Met w/Toe Hallux Lt (28810)  BCBS Benefits: Deductible: $4,000 with $0 met Out-of-Pocket: $5,000 with $4,985 remaining CoInsurance: 20%  Prior authorization is not required per Hurshel Keys as Medicare is primary insurance.  Call Reference #: [id number]

## 2023-01-08 ENCOUNTER — Encounter (HOSPITAL_COMMUNITY): Payer: Self-pay | Admitting: Podiatry

## 2023-01-08 NOTE — Anesthesia Preprocedure Evaluation (Signed)
Anesthesia Evaluation  Patient identified by MRN, date of birth, ID band Patient awake    Reviewed: Allergy & Precautions, NPO status , Patient's Chart, lab work & pertinent test results  Airway Mallampati: II  TM Distance: >3 FB Neck ROM: Full    Dental  (+) Missing   Pulmonary asthma , Current Smoker and Patient abstained from smoking.   Pulmonary exam normal        Cardiovascular hypertension, + Peripheral Vascular Disease  Normal cardiovascular exam     Neuro/Psych  Neuromuscular disease  negative psych ROS   GI/Hepatic negative GI ROS,,,(+) Hepatitis -, C  Endo/Other  diabetes, Insulin Dependent    Renal/GU ESRF and DialysisRenal disease     Musculoskeletal  (+) Arthritis ,    Abdominal   Peds  Hematology  (+) Blood dyscrasia (Plavix), anemia   Anesthesia Other Findings OSTEOMYELITIS LEFT FOOT  Reproductive/Obstetrics                             Anesthesia Physical Anesthesia Plan  ASA: 3  Anesthesia Plan: MAC   Post-op Pain Management:    Induction: Intravenous  PONV Risk Score and Plan: 0 and Ondansetron, Dexamethasone, Propofol infusion and Treatment may vary due to age or medical condition  Airway Management Planned: Simple Face Mask  Additional Equipment:   Intra-op Plan:   Post-operative Plan:   Informed Consent: I have reviewed the patients History and Physical, chart, labs and discussed the procedure including the risks, benefits and alternatives for the proposed anesthesia with the patient or authorized representative who has indicated his/her understanding and acceptance.     Dental advisory given  Plan Discussed with: CRNA  Anesthesia Plan Comments: (PAT note written 01/08/2023 by Myra Gianotti, PA-C.  )       Anesthesia Quick Evaluation

## 2023-01-08 NOTE — Progress Notes (Signed)
PCP - Irene Pap, DO Cardiologist - Dr [name] [facility] - [facility]  CT Chest x-ray - 02/24/19 CE EKG - 10/21/22 Stress Test - 04/27/20 ECHO - 07/29/20 Cardiac Cath - n/a  ICD Pacemaker/Loop - n/a  Sleep Study -  n/a CPAP - none  Diabetes Type 2 Patient has Humalog Insulin Pump - reduce basal rate by 20% at midnight tonight.  Patient has a Dexcom system.  Sensor is located on right abd.  Diabetes Coordinator Sonia Baller informed of insulin pump.  If your blood sugar is less than 70 mg/dL, you will need to treat for low blood sugar: Treat a low blood sugar (less than 70 mg/dL) with  cup of clear juice (cranberry or apple), 4 glucose tablets, OR glucose gel. Recheck blood sugar in 15 minutes after treatment (to make sure it is greater than 70 mg/dL). If your blood sugar is not greater than 70 mg/dL on recheck, call [phone] for further instructions.  Blood Thinner Instructions:  Follow your surgeon's instructions on when to stop Plavix prior to surgery.    Aspirin Instructions: Follow your surgeon's instructions on when to stop aspirin prior to surgery,  If no instructions were given by your surgeon then you will need to call the office for those instructions.   ERAS: Clear liquids til 1:15 PM DOS  Anesthesia review: Yes  STOP now taking any Aspirin (unless otherwise instructed by your surgeon), Aleve, Naproxen, Ibuprofen, Motrin, Advil, Goody's, BC's, all herbal medications, fish oil, and all vitamins.   Coronavirus Screening Do you have any of the following symptoms:  Cough yes/no: No Fever (>100.60F)  yes/no: No Runny nose yes/no: No Sore throat yes/no: No Difficulty breathing/shortness of breath  yes/no: No  Have you traveled in the last 14 days and where? yes/no: No  Patient verbalized understanding of instructions that were given via phone.

## 2023-01-08 NOTE — Progress Notes (Signed)
Anesthesia Chart Review: SAME DAY WORK-UP   Case: [id number] Date/Time: 01/09/23 1600   Procedure: AMPUTATION TOE GREAT TOE (Left: Toe)   Anesthesia type: Choice   Pre-op diagnosis: OSTEOMYELITIS LEFT FOOT   Location: [facility] OR ROOM 04 / [place] [facility]   Surgeons: [name], DPM       DISCUSSION: Patient is a 67 year old male scheduled for the above procedure.   History includes smoking, ESRD (RUE BVT 09/14/19; HD MWF), DM2, HTN, PAD ( laser atherectomy/PTA to left SFA 06/19/20 & to right SFA and right PTA 12/25/20; 1st toe ray amputation 01/12/21, right BKA 03/13/21; left SFA & left popliteal artery stents & PTA left TP trunk 10/21/22; left great toe partial amputation 11/08/22), asthma, glaucoma, hepatitis C.  He had a preoperative medical evaluation/H&P on 01/03/23 by Irene Pap, DO at [place]. He was cleared for surgery.   He is on ASA and Plavix, and was advised to follow surgeon instructions. By note from 11/07/22, he was allowed to continue both for left great toe partial amputation.   Anesthesia team to evaluate on the day of surgery. He has a Dexcome G6 senor and an Omnipod 5 G6 POD (Humalog 100 unit/mL)   VS: Ht '5\' 10"'$  (1.778 m)   Wt 85.4 kg   BMI 27.02 kg/m  01/03/22 ([facility]): Blood Pressure 119/54 01/03/2023 8:45 AM EST    Pulse 82 01/03/2023 8:45 AM EST    Temperature 36.7 C (98 F) 01/03/2023 8:45 AM EST    Respiratory Rate 16 01/03/2023 8:45 AM EST    Oxygen Saturation 98% 01/03/2023 8:45 AM EST    Inhaled Oxygen Concentration - -    Weight 85.4 kg (188 lb 3.2 oz) 01/03/2023 8:45 AM EST    Height 177.8 cm ('5\' 10"'$ ) 01/03/2023 8:45 AM EST    Body Mass Index 27 01/03/2023 8:45 AM EST     PROVIDERS: PCP - Irene Pap, DO Vascular surgeon - Dr Duncan Dull, MD is endocrinologist Pearson Grippe, MD is nephrologist Carol Ada, MD is GI   LABS: For day of surgery. H/H 10.9/32.0 on 11/08/22. A1c 5.8% 09/30/22 ([facility]).    EKG:  10/21/22: Normal sinus rhythm Normal ECG Since last tracing rate slower Confirmed by Dorris Carnes [phone]) on 10/21/2022 6:27:29 PM   CV: Echo 07/29/20: IMPRESSIONS   1. Left ventricular ejection fraction, by estimation, is 55 to 60%. The  left ventricle has normal function. The left ventricle has no regional  wall motion abnormalities. Left ventricular diastolic parameters are  indeterminate.   2. Right ventricular systolic function is normal. The right ventricular  size is normal.   3. Left atrial size was severely dilated.   4. The mitral valve is normal in structure. Mild mitral valve  regurgitation. No evidence of mitral stenosis.   5. The aortic valve is tricuspid. Aortic valve regurgitation is not  visualized. No aortic stenosis is present.   6. The inferior vena cava is normal in size with greater than 50%  respiratory variability, suggesting right atrial pressure of 3 mmHg.  - Comparison echo 04/27/20 at [place], mild LV dysfunction, LVEF 50% with mild global hypokinesis, mild LVH, elevated LA pressures with diastolic dysfunction, normal RVSF, trivial MR/TR.   Nuclear stress test 04/27/20 ([facility]):  FUNCTIONAL RESULTS (calculated via Gated SPECT)   Post Stress Image LV EF: 39 %   Stress EDV: 163 ml EDVI: 91 ml/m TID:   Stress ESV: 99 ml ESVI: 55  ml/m   Rest Image LV EF: 32 %   Rest Image LV EF:   Rest EDV: 170 ml EDVI: 95 ml/m   Rest ESV: 115 ml ESVI: 64 ml/m          Wall Motion   Wall Thickening   Anterior: Mild - Hypokinetic Normal   Apex: Mild - Hypokinetic Normal   Inferior: Normal Normal   Lateral: Mild - Hypokinetic Normal   Septal: Normal Normal    LV Ejection Fraction  & Size: Reduced left ventricular ejection fraction.   Dilated left ventricle.    - Myocardial perfusion imaging is normal. No evidence of regadenoson-inducible ischemia.   - Reduced left ventricular ejection fraction and dilated left ventricle, compatible with a non-ischemic cardiomyopathy.   - LVEF by 04/27/20 echo was ~ 50%.   Past Medical History:  Diagnosis Date   Asthma    Cancer ([place])    Cataract    CKD (chronic kidney disease), stage III ([place])    [facility] at [facility]   Diabetes mellitus    Glaucoma    Hepatitis    Hep C   Hypertension    no longer   Osteomyelitis of great toe of left foot [facility])    Vascular insufficiency 05/2020    Past Surgical History:  Procedure Laterality Date   ABDOMINAL AORTOGRAM W/LOWER EXTREMITY N/A 06/19/2020   Procedure: ABDOMINAL AORTOGRAM W/LOWER EXTREMITY;  Surgeon: Waynetta Sandy, MD;  Location: [place] [facility];  Service: Cardiovascular;  Laterality: N/A;   ABDOMINAL AORTOGRAM W/LOWER EXTREMITY Left 10/16/2020   Procedure: ABDOMINAL AORTOGRAM W/LOWER EXTREMITY;  Surgeon: Waynetta Sandy, MD;  Location: [place] CV LAB;  Service: Cardiovascular;  Laterality: Left;   ABDOMINAL AORTOGRAM W/LOWER EXTREMITY N/A 12/25/2020   Procedure: ABDOMINAL AORTOGRAM W/LOWER EXTREMITY;  Surgeon: Waynetta Sandy, MD;  Location: [place] [facility];  Service: Cardiovascular;  Laterality: N/A;   ABDOMINAL AORTOGRAM W/LOWER EXTREMITY N/A 10/21/2022   Procedure: ABDOMINAL AORTOGRAM W/LOWER EXTREMITY;  Surgeon: Waynetta Sandy, MD;  Location: [place] [facility];  Service: Cardiovascular;  Laterality: N/A;   AMPUTATION Right 01/12/2021   Procedure: 1ST AMPUTATION RAY;  Surgeon: [name], DPM;  Location: [place];  Service: Podiatry;  Laterality: Right;   AMPUTATION Right 03/13/2021   Procedure: RIGHT BELOW KNEE AMPUTATION;  Surgeon: Waynetta Sandy, MD;  Location: [place];  Service: Vascular;  Laterality: Right;   AMPUTATION TOE Left 11/08/2022   Procedure: AMPUTATION TOE;  Surgeon: [name], DPM;  Location: [facility];  Service: Podiatry;  Laterality: Left;  DR MCDONALD WILL BLOCK, ON STRETCHER   AV FISTULA PLACEMENT Right 09/14/2019   Procedure: Right Arm Basilic Vein  transposition;  Surgeon: Angelia Mould, MD;  Location: [place];  Service: Vascular;  Laterality: Right;   BONE BIOPSY Left 07/29/2020   Procedure: BONE BIOPSY;  Surgeon: [name], DPM;  Location: [place];  Service: Podiatry;  Laterality: Left;  Need bone trephines and/or large bore Giamshidi   COLONOSCOPY     COLONOSCOPY WITH PROPOFOL N/A 12/07/2021   Procedure: COLONOSCOPY WITH PROPOFOL;  Surgeon: Carol Ada, MD;  [facility];  Service: Endoscopy;  Laterality: N/A;   PERIPHERAL VASCULAR ATHERECTOMY Left 06/19/2020   Procedure: PERIPHERAL VASCULAR ATHERECTOMY;  Surgeon: Waynetta Sandy, MD;  Location: [place] [facility];  Service: Cardiovascular;  Laterality: Left;  SFA   PERIPHERAL VASCULAR ATHERECTOMY  12/25/2020   Procedure: PERIPHERAL VASCULAR ATHERECTOMY;  Surgeon: Waynetta Sandy,  MD;  Location: [place] [facility];  Service: Cardiovascular;;  Lt. PT - Laser Lt. SFA - Laser   PERIPHERAL VASCULAR BALLOON ANGIOPLASTY  12/25/2020   Procedure: PERIPHERAL VASCULAR BALLOON ANGIOPLASTY;  Surgeon: Waynetta Sandy, MD;  Location: [place] [facility];  Service: Cardiovascular;;  Lt. SFA and PT   PERIPHERAL VASCULAR BALLOON ANGIOPLASTY Left 10/21/2022   Procedure: PERIPHERAL VASCULAR BALLOON ANGIOPLASTY;  Surgeon: Waynetta Sandy, MD;  Location: [place] [facility];  Service: Cardiovascular;  Laterality: Left;   PERIPHERAL VASCULAR INTERVENTION Left 10/21/2022   Procedure: PERIPHERAL VASCULAR INTERVENTION;  Surgeon: Waynetta Sandy, MD;  Location: [place] [facility];  Service: Cardiovascular;  Laterality: Left;   POLYPECTOMY  12/07/2021   Procedure: POLYPECTOMY;  Surgeon: Carol Ada, MD;  [facility];  Service: Endoscopy;;   UPPER GASTROINTESTINAL ENDOSCOPY  02/2019   Dr Benson [place]     WISDOM TOOTH EXTRACTION      MEDICATIONS: No current facility-administered medications for this encounter.    aspirin EC 81 MG EC  tablet   clopidogrel (PLAVIX) 75 MG tablet   iron sucrose in sodium chloride 0.9 % 100 mL   Continuous Blood Gluc Sensor (DEXCOM G6 SENSOR) MISC   Continuous Blood Gluc Transmit (DEXCOM G6 TRANSMITTER) MISC   doxycycline (VIBRA-TABS) 100 MG tablet   gentamicin ointment (GARAMYCIN) 0.1 %   Insulin Disposable Pump (OMNIPOD 5 G6 POD, GEN 5,) MISC   insulin lispro (HUMALOG) 100 UNIT/ML injection   Methoxy PEG-Epoetin Beta (MIRCERA IJ)   pantoprazole (PROTONIX) 40 MG tablet   rosuvastatin (CRESTOR) 10 MG tablet    Myra Gianotti, PA-C Surgical Short Stay/Anesthesiology [facility] Phone [phone] [facility] Phone [phone] 01/08/2023 12:41 PM

## 2023-01-09 ENCOUNTER — Encounter (HOSPITAL_COMMUNITY)
Admission: RE | Disposition: A | Discharge status: Home / Self Care | Payer: Self-pay | Source: Home / Self Care | Attending: Podiatry

## 2023-01-09 ENCOUNTER — Ambulatory Visit (HOSPITAL_COMMUNITY)
Admission: RE | Admit: 2023-01-09 | Discharge: 2023-01-09 | Disposition: A | Discharge status: Home / Self Care | Payer: Medicare Other | Attending: Podiatry | Admitting: Podiatry

## 2023-01-09 ENCOUNTER — Other Ambulatory Visit: Payer: Self-pay

## 2023-01-09 ENCOUNTER — Ambulatory Visit (HOSPITAL_BASED_OUTPATIENT_CLINIC_OR_DEPARTMENT_OTHER): Payer: Medicare Other | Admitting: Vascular Surgery

## 2023-01-09 ENCOUNTER — Encounter (HOSPITAL_COMMUNITY): Payer: Self-pay | Admitting: Podiatry

## 2023-01-09 ENCOUNTER — Ambulatory Visit (HOSPITAL_COMMUNITY): Payer: Medicare Other | Admitting: Vascular Surgery

## 2023-01-09 DIAGNOSIS — T8189XA Other complications of procedures, not elsewhere classified, initial encounter: Secondary | ICD-10-CM | POA: Insufficient documentation

## 2023-01-09 DIAGNOSIS — E1122 Type 2 diabetes mellitus with diabetic chronic kidney disease: Secondary | ICD-10-CM | POA: Insufficient documentation

## 2023-01-09 DIAGNOSIS — M86172 Other acute osteomyelitis, left ankle and foot: Secondary | ICD-10-CM | POA: Diagnosis not present

## 2023-01-09 DIAGNOSIS — S91102A Unspecified open wound of left great toe without damage to nail, initial encounter: Secondary | ICD-10-CM | POA: Diagnosis not present

## 2023-01-09 DIAGNOSIS — Z89511 Acquired absence of right leg below knee: Secondary | ICD-10-CM | POA: Diagnosis not present

## 2023-01-09 DIAGNOSIS — Z794 Long term (current) use of insulin: Secondary | ICD-10-CM | POA: Diagnosis not present

## 2023-01-09 DIAGNOSIS — M869 Osteomyelitis, unspecified: Secondary | ICD-10-CM

## 2023-01-09 DIAGNOSIS — E1169 Type 2 diabetes mellitus with other specified complication: Secondary | ICD-10-CM | POA: Diagnosis not present

## 2023-01-09 DIAGNOSIS — I96 Gangrene, not elsewhere classified: Secondary | ICD-10-CM | POA: Diagnosis not present

## 2023-01-09 DIAGNOSIS — Z7902 Long term (current) use of antithrombotics/antiplatelets: Secondary | ICD-10-CM | POA: Diagnosis not present

## 2023-01-09 DIAGNOSIS — M1990 Unspecified osteoarthritis, unspecified site: Secondary | ICD-10-CM | POA: Insufficient documentation

## 2023-01-09 DIAGNOSIS — I120 Hypertensive chronic kidney disease with stage 5 chronic kidney disease or end stage renal disease: Secondary | ICD-10-CM

## 2023-01-09 DIAGNOSIS — N186 End stage renal disease: Secondary | ICD-10-CM

## 2023-01-09 DIAGNOSIS — D631 Anemia in chronic kidney disease: Secondary | ICD-10-CM

## 2023-01-09 DIAGNOSIS — E1151 Type 2 diabetes mellitus with diabetic peripheral angiopathy without gangrene: Secondary | ICD-10-CM | POA: Insufficient documentation

## 2023-01-09 DIAGNOSIS — L97529 Non-pressure chronic ulcer of other part of left foot with unspecified severity: Secondary | ICD-10-CM | POA: Diagnosis not present

## 2023-01-09 DIAGNOSIS — F17200 Nicotine dependence, unspecified, uncomplicated: Secondary | ICD-10-CM

## 2023-01-09 DIAGNOSIS — J45909 Unspecified asthma, uncomplicated: Secondary | ICD-10-CM | POA: Insufficient documentation

## 2023-01-09 DIAGNOSIS — Z992 Dependence on renal dialysis: Secondary | ICD-10-CM | POA: Insufficient documentation

## 2023-01-09 DIAGNOSIS — X58XXXA Exposure to other specified factors, initial encounter: Secondary | ICD-10-CM | POA: Diagnosis not present

## 2023-01-09 DIAGNOSIS — M86672 Other chronic osteomyelitis, left ankle and foot: Secondary | ICD-10-CM | POA: Diagnosis not present

## 2023-01-09 HISTORY — DX: Osteomyelitis, unspecified: M86.9

## 2023-01-09 HISTORY — PX: AMPUTATION TOE: SHX6595

## 2023-01-09 LAB — POCT I-STAT, CHEM 8
BUN: 21 mg/dL (ref 8–23)
Calcium, Ion: 1.08 mmol/L — ABNORMAL LOW (ref 1.15–1.40)
Chloride: 94 mmol/L — ABNORMAL LOW (ref 98–111)
Creatinine, Ser: 6.6 mg/dL — ABNORMAL HIGH (ref 0.61–1.24)
Glucose, Bld: 159 mg/dL — ABNORMAL HIGH (ref 70–99)
HCT: 34 % — ABNORMAL LOW (ref 39.0–52.0)
Hemoglobin: 11.6 g/dL — ABNORMAL LOW (ref 13.0–17.0)
Potassium: 2.9 mmol/L — ABNORMAL LOW (ref 3.5–5.1)
Sodium: 138 mmol/L (ref 135–145)
TCO2: 29 mmol/L (ref 22–32)

## 2023-01-09 LAB — GLUCOSE, CAPILLARY
Glucose-Capillary: 176 mg/dL — ABNORMAL HIGH (ref 70–99)
Glucose-Capillary: 83 mg/dL (ref 70–99)

## 2023-01-09 SURGERY — AMPUTATION, TOE
Anesthesia: Monitor Anesthesia Care | Site: Toe | Laterality: Left

## 2023-01-09 MED ORDER — ACETAMINOPHEN 500 MG PO TABS
1000.0000 mg | ORAL_TABLET | Freq: Once | ORAL | Status: AC
  Administered 2023-01-09: 1000 mg via ORAL
  Filled 2023-01-09: qty 2

## 2023-01-09 MED ORDER — ALBUMIN HUMAN 5 % IV SOLN: INTRAVENOUS | Status: DC | PRN

## 2023-01-09 MED ORDER — LACTATED RINGERS IV SOLN: INTRAVENOUS | Status: DC

## 2023-01-09 MED ORDER — PROPOFOL 500 MG/50ML IV EMUL
INTRAVENOUS | Status: DC | PRN
  Administered 2023-01-09: 100 ug/kg/min via INTRAVENOUS

## 2023-01-09 MED ORDER — BUPIVACAINE HCL (PF) 0.25 % IJ SOLN
INTRAMUSCULAR | Status: AC
  Filled 2023-01-09: qty 30

## 2023-01-09 MED ORDER — PROPOFOL 10 MG/ML IV BOLUS
INTRAVENOUS | Status: AC
  Filled 2023-01-09: qty 20

## 2023-01-09 MED ORDER — OXYCODONE HCL 5 MG PO TABS: 5.0000 mg | ORAL_TABLET | ORAL | 0 refills | Status: AC | PRN

## 2023-01-09 MED ORDER — PHENYLEPHRINE 80 MCG/ML (10ML) SYRINGE FOR IV PUSH (FOR BLOOD PRESSURE SUPPORT)
PREFILLED_SYRINGE | INTRAVENOUS | Status: AC
  Filled 2023-01-09: qty 10

## 2023-01-09 MED ORDER — SODIUM CHLORIDE 0.9 % IV SOLN: INTRAVENOUS | Status: DC

## 2023-01-09 MED ORDER — PROPOFOL 10 MG/ML IV BOLUS
INTRAVENOUS | Status: DC | PRN
  Administered 2023-01-09 (×2): 25 mg via INTRAVENOUS

## 2023-01-09 MED ORDER — CEFAZOLIN SODIUM-DEXTROSE 2-4 GM/100ML-% IV SOLN
2.0000 g | Freq: Three times a day (TID) | INTRAVENOUS | Status: DC
  Administered 2023-01-09: 2 g via INTRAVENOUS
  Filled 2023-01-09 (×2): qty 100

## 2023-01-09 MED ORDER — 0.9 % SODIUM CHLORIDE (POUR BTL) OPTIME
TOPICAL | Status: DC | PRN
  Administered 2023-01-09: 1000 mL

## 2023-01-09 MED ORDER — LIDOCAINE 2% (20 MG/ML) 5 ML SYRINGE
INTRAMUSCULAR | Status: DC | PRN
  Administered 2023-01-09 (×2): 40 mg via INTRAVENOUS

## 2023-01-09 MED ORDER — PHENYLEPHRINE 80 MCG/ML (10ML) SYRINGE FOR IV PUSH (FOR BLOOD PRESSURE SUPPORT)
PREFILLED_SYRINGE | INTRAVENOUS | Status: DC | PRN
  Administered 2023-01-09 (×3): 160 ug via INTRAVENOUS

## 2023-01-09 MED ORDER — CHLORHEXIDINE GLUCONATE 0.12 % MT SOLN
15.0000 mL | OROMUCOSAL | Status: AC
  Administered 2023-01-09: 15 mL via OROMUCOSAL
  Filled 2023-01-09 (×2): qty 15

## 2023-01-09 MED ORDER — BUPIVACAINE HCL (PF) 0.25 % IJ SOLN
INTRAMUSCULAR | Status: DC | PRN
  Administered 2023-01-09: 10 mL

## 2023-01-09 SURGICAL SUPPLY — 37 items
BLADE LONG MED 31X9 (MISCELLANEOUS) IMPLANT
BNDG ELASTIC 4X5.8 VLCR STR LF (GAUZE/BANDAGES/DRESSINGS) IMPLANT
BNDG GAUZE DERMACEA FLUFF 4 (GAUZE/BANDAGES/DRESSINGS) IMPLANT
CNTNR URN SCR LID CUP LEK RST (MISCELLANEOUS) IMPLANT
CONT SPEC 4OZ STRL OR WHT (MISCELLANEOUS) ×6
COVER SURGICAL LIGHT HANDLE (MISCELLANEOUS) ×1 IMPLANT
CUFF TOURN SGL QUICK 24 (TOURNIQUET CUFF)
CUFF TRNQT CYL 24X4X16.5-23 (TOURNIQUET CUFF) IMPLANT
DRAPE SURG 17X23 STRL (DRAPES) ×1 IMPLANT
DRSG XEROFORM 1X8 (GAUZE/BANDAGES/DRESSINGS) IMPLANT
GAUZE SPONGE 2X2 8PLY STRL LF (GAUZE/BANDAGES/DRESSINGS) IMPLANT
GAUZE SPONGE 4X4 12PLY STRL (GAUZE/BANDAGES/DRESSINGS) IMPLANT
GAUZE XEROFORM 1X8 LF (GAUZE/BANDAGES/DRESSINGS) IMPLANT
GLOVE BIOGEL M 7.0 STRL (GLOVE) ×1 IMPLANT
GLOVE PI ORTHO PRO STRL 7.5 (GLOVE) ×1 IMPLANT
GOWN STRL REUS W/ TWL LRG LVL3 (GOWN DISPOSABLE) ×2 IMPLANT
GOWN STRL REUS W/TWL LRG LVL3 (GOWN DISPOSABLE) ×2
KIT BASIN OR (CUSTOM PROCEDURE TRAY) ×1 IMPLANT
KIT TURNOVER KIT B (KITS) ×1 IMPLANT
NDL HYPO 25GX1X1/2 BEV (NEEDLE) IMPLANT
NEEDLE HYPO 25GX1X1/2 BEV (NEEDLE) ×1 IMPLANT
NS IRRIG 1000ML POUR BTL (IV SOLUTION) ×1 IMPLANT
PACK ORTHO EXTREMITY (CUSTOM PROCEDURE TRAY) ×1 IMPLANT
PAD ARMBOARD 7.5X6 YLW CONV (MISCELLANEOUS) ×2 IMPLANT
SOL PREP POV-IOD 4OZ 10% (MISCELLANEOUS) ×3 IMPLANT
SPECIMEN JAR SMALL (MISCELLANEOUS) ×1 IMPLANT
SUT ETHILON 3 0 PS 1 (SUTURE) ×1 IMPLANT
SUT ETHILON 4 0 CL P 3 (SUTURE) IMPLANT
SUT ETHILON 4 0 PS 2 18 (SUTURE) IMPLANT
SUT MNCRL AB 3-0 PS2 18 (SUTURE) IMPLANT
SUT MNCRL AB 4-0 PS2 18 (SUTURE) IMPLANT
SYR CONTROL 10ML LL (SYRINGE) IMPLANT
TOWEL GREEN STERILE (TOWEL DISPOSABLE) ×1 IMPLANT
TOWEL GREEN STERILE FF (TOWEL DISPOSABLE) ×1 IMPLANT
TUBE CONNECTING 12X1/4 (SUCTIONS) IMPLANT
UNDERPAD 30X36 HEAVY ABSORB (UNDERPADS AND DIAPERS) ×1 IMPLANT
WATER STERILE IRR 1000ML POUR (IV SOLUTION) ×1 IMPLANT

## 2023-01-09 NOTE — Anesthesia Procedure Notes (Signed)
Procedure Name: MAC Date/Time: 01/09/2023 4:30 PM  Performed by: Rande Brunt, CRNAPre-anesthesia Checklist: Patient identified, Emergency Drugs available, Suction available and Patient being monitored Patient Re-evaluated:Patient Re-evaluated prior to induction Oxygen Delivery Method: Simple face mask Preoxygenation: Pre-oxygenation with 100% oxygen Induction Type: IV induction Placement Confirmation: positive ETCO2 and CO2 detector Dental Injury: Teeth and Oropharynx as per pre-operative assessment

## 2023-01-09 NOTE — H&P (Signed)
History and Physical Interval Note:  01/09/2023 3:38 PM  Duane Hall  has presented today for surgery, with the diagnosis of osteomyelitis.  The various methods of treatment have been discussed with the patient and family. After consideration of risks, benefits and other options for treatment, the patient has consented to   Procedure(s): AMPUTATION TOE GREAT TOE (Left) as a surgical intervention.  The patient's history has been reviewed, patient examined, no change in status, stable for surgery.  I have reviewed the patient's chart and labs.  Questions were answered to the patient's satisfaction.     [name]

## 2023-01-09 NOTE — Discharge Instructions (Signed)
Post-Surgery Instructions  1. If you are recuperating from surgery anywhere other than home, please be sure to leave [place] a number where you can be reached. 2. Go directly home and rest. 3. The keep operated foot (or feet) elevated six inches above the hip when sitting or lying down. 4. Support the elevated foot and leg with pillows under the calf. DO NOT PLACE PILLOWS UNDER THE KNEE. 5. Change the dressing Sunday morning and then every 2-3 days. You may clean the incisions with saline. If there is maceration or drainage apply some betadine liquid along the stitches. Dress with gauze and tape 6. Wear your surgical shoe at all times when you are up. 7. A limited amount of pain and swelling may occur. The skin may take on a bruised appearance. This is no cause for alarm. 8. For slight pain and swelling, apply an ice pack directly over the bandage for 15 minutes every hour. Continue icing until seen in the office. DO NOT apply any form of heat to the area. 9. Have prescription(s) filled immediately and take as directed. 10. Drink lots of liquids, water, and juice. 11. CALL THE OFFICE IMMEDIATELY IF: a. Bleeding continues b. Pain increases and/or does not respond to medication c. Bandage or cast appears too tight d. Any liquids (water, coffee, etc.) have spilled on your bandages. e. Tripping, falling, or stubbing the surgical foot f. If your temperature rises above 101 g. If you have ANY questions at all 12. Please use the crutches, knee scooter, or walker you have prescribed, rented, or purchased. If you are non-weight bearing DO NOT put weight on the operated foot for _________ days. If you are weight-bearing, follow your physician's instructions. You are expected to be: ? weight-bearing ? non-weight bearing 13. Special Instructions:  _____________________________________________________________ _________________________________________________________________________________ _________________________________________________________________________________  14. Your next appointment is: 01/16/2023 3:45 PM   If you need to reach the nurse for any reason, please call: [place]: [phone] [place]: [phone] [place]: [phone]

## 2023-01-09 NOTE — H&P (Signed)
Anesthesia H&P Update: History and Physical Exam reviewed; patient is OK for planned anesthetic and procedure. ? ?

## 2023-01-09 NOTE — Transfer of Care (Signed)
Immediate Anesthesia Transfer of Care Note  Patient: Duane Hall  Procedure(s) Performed: AMPUTATION OF REMAINING LEFT GREAT TOE AND BONE BEHIND TOE (Left: Toe)  Patient Location: PACU  Anesthesia Type:MAC  Level of Consciousness: awake, alert , and patient cooperative  Airway & Oxygen Therapy: Patient Spontanous Breathing  Post-op Assessment: Report given to RN, Post -op Vital signs reviewed and stable, and Patient moving all extremities X 4  Post vital signs: Reviewed and stable  Last Vitals:  Vitals Value Taken Time  BP 96/53 01/09/23 1715  Temp    Pulse 72 01/09/23 1716  Resp 9 01/09/23 1716  SpO2 100 % 01/09/23 1716  Vitals shown include unvalidated device data.  Last Pain:  Vitals:   01/09/23 1530  TempSrc:   PainSc: 0-No pain         Complications: No notable events documented.

## 2023-01-09 NOTE — Brief Op Note (Signed)
01/09/2023  5:09 PM  PATIENT:  Duane Hall  67 y.o. male  PRE-OPERATIVE DIAGNOSIS:  Osteomyelitis  POST-OPERATIVE DIAGNOSIS:  Osteomyelitis  PROCEDURE:  Procedure(s): AMPUTATION OF REMAINING LEFT GREAT TOE AND BONE BEHIND TOE (Left)  SURGEON:  Surgeon(s) and Role:    * Jaidin Richison, Stephan Minister, DPM - Primary  ASSISTANT: NONE   ANESTHESIA:   local and MAC  EBL:  25 mL   BLOOD ADMINISTERED:none  DRAINS: none   LOCAL MEDICATIONS USED:  MARCAINE    and Amount: 10 ml  SPECIMEN:  proximal left 1st phalanx  DISPOSITION OF SPECIMEN:  path and tissue culture  COUNTS:  YES  TOURNIQUET:  * No tourniquets in log *  DICTATION: .Note written in EPIC  PLAN OF CARE: Discharge to home after PACU  PATIENT DISPOSITION:  PACU - hemodynamically stable.   Delay start of Pharmacological VTE agent (>24hrs) due to surgical blood loss or risk of bleeding: no

## 2023-01-10 NOTE — Anesthesia Postprocedure Evaluation (Signed)
Anesthesia Post Note  Patient: Duane Hall  Procedure(s) Performed: AMPUTATION OF REMAINING LEFT GREAT TOE AND BONE BEHIND TOE (Left: Toe)     Patient location during evaluation: PACU Anesthesia Type: MAC Level of consciousness: awake and alert Pain management: pain level controlled Vital Signs Assessment: post-procedure vital signs reviewed and stable Respiratory status: spontaneous breathing, nonlabored ventilation and respiratory function stable Cardiovascular status: blood pressure returned to baseline and stable Postop Assessment: no apparent nausea or vomiting Anesthetic complications: no   No notable events documented.  Last Vitals:  Vitals:   01/09/23 1715 01/09/23 1730  BP: (!) 96/53 125/60  Pulse: 73 70  Resp: 16 10  Temp: 36.5 C 36.5 C  SpO2: 98% 100%    Last Pain:  Vitals:   01/09/23 1715  TempSrc:   PainSc: 0-No pain                 Pervis Hocking

## 2023-01-10 NOTE — Op Note (Signed)
Patient Name: Duane Hall DOB: [birth year]  MRN: [id number]   Date of Service: 01/09/2023  Surgeon: Dr. Lanae Crumbly, DPM Assistants: None Pre-operative Diagnosis:  Osteomyelitis left great toe Nonhealing wound left great toe Post-operative Diagnosis:  Osteomyelitis left great toe Nonhealing wound left great toe Procedures:  1) amputation metatarsal phalangeal joint left first toe Pathology/Specimens: ID Type Source Tests Collected by Time Destination  1 : Left First Proximal Phalanx Tissue PATH Digit amputation SURGICAL PATHOLOGY [name], DPM 01/09/2023 1628   A : Left First Proximal Phalanx Tissue Bone AEROBIC/ANAEROBIC CULTURE W GRAM STAIN (SURGICAL/DEEP WOUND) [name], DPM 01/09/2023 1631    Anesthesia: MAC with local Hemostasis: * No tourniquets in log * Estimated Blood Loss: 25 mL Materials: * No implants in log * Medications: 10 cc 0.5% Marcaine plain Complications: No complication noted  Indications for Procedure:  This is a 67 y.o. male with a history of peripheral arterial disease and previous left amputation for gangrene, after a period of nonhealing and worsening wound state I recommended further amputation of the remainder of the proximal phalanx   Procedure in Detail: Patient was identified in pre-operative holding area. Formal consent was signed and the left lower extremity was marked. Patient was brought back to the operating room. Anesthesia was induced. The extremity was prepped and draped in the usual sterile fashion. Timeout was taken to confirm patient name, laterality, and procedure prior to incision.   Attention was then directed to the left first toe which exhibited an open ulceration at the remainder of the stump of the left great toe.  An incision was made circumscribing the open ulceration which was excised in toto.  The soft tissues were raised from the dorsal proximal phalanx which appeared to be osteomyelitic distally.  The soft tissue  attachments of the metatarsal phalangeal joint were released and the proximal phalanx and toe were sent for culture and path.  The wound was thoroughly irrigated and hemostasis was achieved.  Wound closure was completed with Monocryl and nylon.  The foot was then dressed with 4 x 4's and dry sterile dressings. Patient tolerated the procedure well.   Disposition: Following a period of post-operative monitoring, patient will be transferred to home.

## 2023-01-11 ENCOUNTER — Encounter (HOSPITAL_COMMUNITY): Payer: Self-pay | Admitting: Podiatry

## 2023-01-12 ENCOUNTER — Encounter: Payer: Self-pay | Admitting: Podiatry

## 2023-01-13 ENCOUNTER — Other Ambulatory Visit: Payer: Self-pay

## 2023-01-13 DIAGNOSIS — E1122 Type 2 diabetes mellitus with diabetic chronic kidney disease: Secondary | ICD-10-CM

## 2023-01-13 MED ORDER — DEXCOM G6 TRANSMITTER MISC: 3 refills | Status: DC

## 2023-01-14 LAB — AEROBIC/ANAEROBIC CULTURE W GRAM STAIN (SURGICAL/DEEP WOUND): Gram Stain: NONE SEEN

## 2023-01-15 LAB — SURGICAL PATHOLOGY

## 2023-01-16 ENCOUNTER — Ambulatory Visit (INDEPENDENT_AMBULATORY_CARE_PROVIDER_SITE_OTHER): Payer: Medicare Other | Admitting: Podiatry

## 2023-01-16 DIAGNOSIS — Z9889 Other specified postprocedural states: Secondary | ICD-10-CM

## 2023-01-20 ENCOUNTER — Encounter: Payer: Self-pay | Admitting: Podiatry

## 2023-01-20 NOTE — Progress Notes (Signed)
  Subjective:  Patient ID: Duane Hall, male    DOB: 1956/06/17,  MRN: [id number]  Chief Complaint  Patient presents with   Routine Post Op    POV #1 DOS 01/09/2023 LT GREAT TOE  AMPUTATION      67 y.o. male returns for post-op check.  He doing okay  Review of Systems: Negative except as noted in the HPI. Denies N/V/F/Ch.   Objective:  There were no vitals filed for this visit. There is no height or weight on file to calculate BMI. Constitutional Well developed. Well nourished.  Vascular Foot warm and well perfused. Capillary refill normal to all remaining digits.  Calf is soft and supple, no posterior calf or knee pain, negative Homans' sign  Neurologic Normal speech. Oriented to person, place, and time. Epicritic sensation to light touch grossly reduced    Dermatologic Skin healing well without signs of infection. Skin edges well coapted without signs of infection.  Orthopedic: He has no tenderness to palpation noted about the surgical site.      Assessment:   1. Post-operative state    Plan:  Patient was evaluated and treated and all questions answered.  S/p foot surgery left -Progressing as expected post-operatively.  Appears to be healing well.  Continue scheduled dressing changes every 2 to 3 days.  Continue use of surgical shoe to protect sutures.  Return in 2 weeks for suture removal   Return in about 2 weeks (around 01/30/2023) for suture removal.

## 2023-01-28 ENCOUNTER — Ambulatory Visit (INDEPENDENT_AMBULATORY_CARE_PROVIDER_SITE_OTHER): Payer: Medicare Other | Admitting: Podiatry

## 2023-01-28 DIAGNOSIS — Z9889 Other specified postprocedural states: Secondary | ICD-10-CM

## 2023-02-03 NOTE — Progress Notes (Signed)
  Subjective:  Patient ID: Duane Hall, male    DOB: [birth year],  MRN: [id number]  Chief Complaint  Patient presents with   Routine Post Op    POV #2 DOS 01/09/2023 LT GREAT TOE VS PARTIAL METATARSL AMPUTATION/[name] PT      67 y.o. male returns for post-op check.  Notes some drainage is been changing the dressing every couple of days  Review of Systems: Negative except as noted in the HPI. Denies N/V/F/Ch.   Objective:  There were no vitals filed for this visit. There is no height or weight on file to calculate BMI. Constitutional Well developed. Well nourished.  Vascular Foot warm and well perfused. Capillary refill normal to all remaining digits.  Calf is soft and supple, no posterior calf or knee pain, negative Homans' sign  Neurologic Normal speech. Oriented to person, place, and time. Epicritic sensation to light touch grossly reduced    Dermatologic Some delayed healing on the lateral portion  Orthopedic: He has no tenderness to palpation noted about the surgical site.      Assessment:   1. Post-operative state     Plan:  Patient was evaluated and treated and all questions answered.  S/p foot surgery left -Overall doing okay there is some delayed healing on the lateral portion, recommend they begin to change the dressing daily to help reduce this drainage.  Sutures were left intact today I will see him back in 2 weeks for removal  Return in about 2 weeks (around 02/11/2023) for suture removal, post op (no x-rays).

## 2023-02-11 ENCOUNTER — Ambulatory Visit (INDEPENDENT_AMBULATORY_CARE_PROVIDER_SITE_OTHER): Payer: Medicare Other | Admitting: Podiatry

## 2023-02-11 DIAGNOSIS — I96 Gangrene, not elsewhere classified: Secondary | ICD-10-CM | POA: Diagnosis not present

## 2023-02-11 DIAGNOSIS — I739 Peripheral vascular disease, unspecified: Secondary | ICD-10-CM

## 2023-02-11 MED ORDER — GENTAMICIN SULFATE 0.1 % EX OINT: 1.0000 | TOPICAL_OINTMENT | Freq: Every day | CUTANEOUS | 0 refills | Status: DC

## 2023-02-13 NOTE — Progress Notes (Signed)
  Subjective:  Patient ID: Duane Hall, male    DOB: Aug 11, 1956,  MRN: [id number]  Chief Complaint  Patient presents with   Routine Post Op    suture removal, post op (no x-rays).       67 y.o. male returns for post-op check.  He said the drainage is slowing down  Review of Systems: Negative except as noted in the HPI. Denies N/V/F/Ch.   Objective:  There were no vitals filed for this visit. There is no height or weight on file to calculate BMI. Constitutional Well developed. Well nourished.  Vascular Foot warm and well perfused. Capillary refill normal to all remaining digits.  Calf is soft and supple, no posterior calf or knee pain, negative Homans' sign  Neurologic Normal speech. Oriented to person, place, and time. Epicritic sensation to light touch grossly reduced    Dermatologic Overall incision is fairly well-healed small area on the lateral and medial most portion of the incision with maceration  Orthopedic: He has no tenderness to palpation noted about the surgical site.      Assessment:   1. PAD (peripheral artery disease) ([place])   2. Dry gangrene ([place])     Plan:  Patient was evaluated and treated and all questions answered.  S/p foot surgery left -Continues to improve.  Still some areas of maceration they will continue to dress with gentamicin antibiotic ointment may leave open to air.  Rx erythromycin ointment was sent to pharmacy.  All sutures removed today.  May continue WBAT in regular shoe gear.  I would like to see him back in 2 weeks for reevaluation  Return in about 2 weeks (around 02/25/2023) for wound care.

## 2023-02-14 ENCOUNTER — Encounter: Payer: Self-pay | Admitting: Podiatry

## 2023-02-18 ENCOUNTER — Encounter: Payer: Medicare Other | Admitting: Podiatry

## 2023-02-19 ENCOUNTER — Encounter: Payer: Self-pay | Admitting: Podiatry

## 2023-02-20 ENCOUNTER — Ambulatory Visit (INDEPENDENT_AMBULATORY_CARE_PROVIDER_SITE_OTHER): Payer: BC Managed Care – PPO | Admitting: Podiatry

## 2023-02-20 ENCOUNTER — Encounter: Payer: Medicare Other | Admitting: Podiatry

## 2023-02-20 VITALS — BP 100/57 | HR 90 | Temp 98.2°F

## 2023-02-20 DIAGNOSIS — L97529 Non-pressure chronic ulcer of other part of left foot with unspecified severity: Secondary | ICD-10-CM

## 2023-02-20 DIAGNOSIS — T8131XA Disruption of external operation (surgical) wound, not elsewhere classified, initial encounter: Secondary | ICD-10-CM

## 2023-02-20 MED ORDER — DOXYCYCLINE HYCLATE 100 MG PO TABS: 100.0000 mg | ORAL_TABLET | Freq: Two times a day (BID) | ORAL | 0 refills | Status: DC

## 2023-02-23 ENCOUNTER — Encounter: Payer: Self-pay | Admitting: Podiatry

## 2023-02-23 LAB — WOUND CULTURE
MICRO NUMBER:: 14601359
SPECIMEN QUALITY:: ADEQUATE

## 2023-02-23 NOTE — Progress Notes (Signed)
  Subjective:  Patient ID: Duane Hall, male    DOB: Apr 14, 1956,  MRN: [id number]  Chief Complaint  Patient presents with   Foot Ulcer    Urgent work in - left foot ulcer has opened up again      67 y.o. male returns for post-op check.  He had his wife noted opening along the medial side of the incision  Review of Systems: Negative except as noted in the HPI. Denies N/V/F/Ch.   Objective:   Vitals:   02/20/23 1007  BP: (!) 100/57  Pulse: 90  Temp: 98.2 F (36.8 C)   There is no height or weight on file to calculate BMI. Constitutional Well developed. Well nourished.  Vascular Foot warm and well perfused. Capillary refill normal to all remaining digits.  Calf is soft and supple, no posterior calf or knee pain, negative Homans' sign  Neurologic Normal speech. Oriented to person, place, and time. Epicritic sensation to light touch grossly reduced    Dermatologic Area of previous maceration shows some level of dehiscence probes deep, small area of serous drainage no purulence malodor, there is some erythema  Orthopedic: He has no tenderness to palpation noted about the surgical site.      Assessment:   1. Dehiscence of operative wound, initial encounter   2. Skin ulcer of left great toe, unspecified ulcer stage ([place])     Plan:  Patient was evaluated and treated and all questions answered.  S/p foot surgery left -Culture of the deep sinus tract was taken.  I placed him on doxycycline.  Packed with Aquacel they will change this daily.  Culture shows group B strep.  Will add a cephalosporin with renal dosing for his dialysis sessions.  Return in 1 week for follow-up  Return in about 1 week (around 02/27/2023) for wound care.

## 2023-02-24 ENCOUNTER — Telehealth: Payer: Self-pay

## 2023-02-24 MED ORDER — CEPHALEXIN 500 MG PO CAPS: 500.0000 mg | ORAL_CAPSULE | ORAL | 0 refills | Status: AC

## 2023-02-24 NOTE — Telephone Encounter (Signed)
Patient is aware 

## 2023-02-25 ENCOUNTER — Ambulatory Visit (INDEPENDENT_AMBULATORY_CARE_PROVIDER_SITE_OTHER): Payer: BLUE CROSS/BLUE SHIELD | Admitting: Podiatry

## 2023-02-25 VITALS — BP 113/66 | HR 85 | Temp 103.5°F

## 2023-02-25 DIAGNOSIS — I739 Peripheral vascular disease, unspecified: Secondary | ICD-10-CM

## 2023-02-25 DIAGNOSIS — L97529 Non-pressure chronic ulcer of other part of left foot with unspecified severity: Secondary | ICD-10-CM | POA: Diagnosis not present

## 2023-02-25 DIAGNOSIS — E11628 Type 2 diabetes mellitus with other skin complications: Secondary | ICD-10-CM | POA: Diagnosis not present

## 2023-02-25 DIAGNOSIS — T8131XA Disruption of external operation (surgical) wound, not elsewhere classified, initial encounter: Secondary | ICD-10-CM

## 2023-02-25 DIAGNOSIS — L089 Local infection of the skin and subcutaneous tissue, unspecified: Secondary | ICD-10-CM

## 2023-02-25 MED ORDER — LEVOFLOXACIN 500 MG PO TABS: 500.0000 mg | ORAL_TABLET | ORAL | 0 refills | Status: AC

## 2023-02-26 ENCOUNTER — Telehealth: Payer: Self-pay

## 2023-02-26 NOTE — Telephone Encounter (Signed)
(  3:00 pm) PC SW scheduled initial palliative care visit for patient with his wife. Palliative care clinical team is scheduled for 03/04/23 @ 9am.

## 2023-02-26 NOTE — Progress Notes (Signed)
  Subjective:  Patient ID: Duane Hall, male    DOB: 1956-04-14,  MRN: [id number]  Chief Complaint  Patient presents with   Foot Ulcer    1 week follow up      67 y.o. male returns for post-op check.  He feels that it has been doing worse.  He has had chills  Review of Systems: Having chills, has been febrile at home according to his wife at 99.8   Objective:   Vitals:   02/25/23 1502  BP: 113/66  Pulse: 85  Temp: (!) 103.5 F (39.7 C)   There is no height or weight on file to calculate BMI. Constitutional Well developed. Well nourished.  Vascular Foot warm and well perfused. Capillary refill normal to all remaining digits.  Calf is soft and supple, no posterior calf or knee pain, negative Homans' sign  Neurologic Normal speech. Oriented to person, place, and time. Epicritic sensation to light touch grossly reduced    Dermatologic Cellulitis developing around the first ray to the level of the midfoot, there is serous drainage  Orthopedic: He has no tenderness to palpation noted about the surgical site.      Recent Results (from the past 240 hour(s))  WOUND CULTURE     Status: Abnormal   Collection Time: 02/20/23 10:51 AM   Specimen: Ulcer; Wound  Result Value Ref Range Status   MICRO NUMBER: [id number]  Final   SPECIMEN QUALITY: Adequate  Final   SOURCE: WOUND (SITE NOT SPECIFIED)  Final   STATUS: FINAL  Final   GRAM STAIN:   Final    No epithelial cells seen No white blood cells seen Many Gram positive cocci in pairs   ISOLATE 1: Streptococcus agalactiae (A)  Final    Comment: Heavy growth of Group B Streptococcus isolated Beta-hemolytic streptococci are predictably susceptible to Penicillin and other beta-lactams. Susceptibility testing not routinely performed. Please contact the laboratory within 3 days if susceptibility  testing is desired.     Assessment:   1. Skin ulcer of left great toe, unspecified ulcer stage ([place])   2. PAD (peripheral artery disease)  ([place])   3. Dehiscence of operative wound, initial encounter   4. Diabetic foot infection ([place])     Plan:  Patient was evaluated and treated and all questions answered.  Unfortunately continues to worsen and he is showing signs of severe infection and early signs of sepsis, he was febrile to 103.5 in the office today.  I advised him to proceed directly to the emergency room for IV antibiotic therapy, likely will need further surgical intervention on the foot such as a transmetatarsal amputation.  He indicated he would not do this, he indicates that he is sick of being in the hospital and dealing with this.  His wife inquired what will happen if he does nothing, I did advise him that likely the infection will spread become septic and could result in death.  He says that he understands this and still would like to go home and not go to the hospital.  I did broaden his antibiotics to ciprofloxacin and doxycycline for additional gram-negative coverage, consistent dosing difficult with his HD.  I will see him back in 2 days to reevaluate if he still does not go to the hospital but again I recommended strongly that he go there to have this severe worsening infection treated acutely No follow-ups on file.

## 2023-02-27 ENCOUNTER — Telehealth: Payer: Self-pay | Admitting: Urology

## 2023-02-27 ENCOUNTER — Ambulatory Visit (INDEPENDENT_AMBULATORY_CARE_PROVIDER_SITE_OTHER): Payer: Medicare Other | Admitting: Podiatry

## 2023-02-27 ENCOUNTER — Encounter: Payer: Self-pay | Admitting: Podiatry

## 2023-02-27 VITALS — BP 86/51 | HR 65 | Temp 99.3°F

## 2023-02-27 DIAGNOSIS — T8131XA Disruption of external operation (surgical) wound, not elsewhere classified, initial encounter: Secondary | ICD-10-CM | POA: Diagnosis not present

## 2023-02-27 DIAGNOSIS — M86472 Chronic osteomyelitis with draining sinus, left ankle and foot: Secondary | ICD-10-CM | POA: Diagnosis not present

## 2023-02-27 NOTE — Telephone Encounter (Signed)
OFFICE DOS - 02/28/23  BONE BIOPSY LEFT --- 20240  MEDICARE A & B BCBS ANTHEM   BCBS EFFECTIVE DATE -12/30/22  DEDUCTIBLE - $4,000.00 W/ [id number] to go OOP - $5,000.00 W/ $3,778.48 to go COINSURANCE - 10%  SPOKE WITH GEL B. WITH BCBS ANTHEM AND SHE STATED THAT FOR CPT CODE 96295 NO PRIOR AUTH IS REQUIRED.  REF # I [id number]

## 2023-02-27 NOTE — Progress Notes (Signed)
  Subjective:  Patient ID: Duane Hall, male    DOB: 1956/06/18,  MRN: [id number]  Chief Complaint  Patient presents with   Foot Ulcer    wound care -per Nira Conn and DR      67 y.o. male returns for post-op check.  Says he feels about the same.  No fevers  Review of Systems: Temperature still in the high 99's at home per his wife    Objective:   Vitals:   02/27/23 1517  BP: (!) 86/51  Pulse: 65  Temp: 99.3 F (37.4 C)   There is no height or weight on file to calculate BMI. Constitutional Well developed. Well nourished.  Vascular Foot warm and well perfused. Capillary refill normal to all remaining digits.  Calf is soft and supple, no posterior calf or knee pain, negative Homans' sign  Neurologic Normal speech. Oriented to person, place, and time. Epicritic sensation to light touch grossly reduced    Dermatologic Unchanged cellulitis, draining serous drainage from first ray, cartilage pulled out from wound  Orthopedic: He has no tenderness to palpation noted about the surgical site.      Recent Results (from the past 240 hour(s))  WOUND CULTURE     Status: Abnormal   Collection Time: 02/20/23 10:51 AM   Specimen: Ulcer; Wound  Result Value Ref Range Status   MICRO NUMBER: [id number]  Final   SPECIMEN QUALITY: Adequate  Final   SOURCE: WOUND (SITE NOT SPECIFIED)  Final   STATUS: FINAL  Final   GRAM STAIN:   Final    No epithelial cells seen No white blood cells seen Many Gram positive cocci in pairs   ISOLATE 1: Streptococcus agalactiae (A)  Final    Comment: Heavy growth of Group B Streptococcus isolated Beta-hemolytic streptococci are predictably susceptible to Penicillin and other beta-lactams. Susceptibility testing not routinely performed. Please contact the laboratory within 3 days if susceptibility  testing is desired.     Assessment:   1. Dehiscence of operative wound, initial encounter   2. Chronic osteomyelitis of left foot with draining sinus ([facility])      Plan:  Patient was evaluated and treated and all questions answered.  We again discussed his prognosis.  He brought his updated advanced directives and DNR status was updated and scanned into his chart.  We discussed what he is willing and not willing to go through with at this point.  He would like to minimize visits to and from doctors offices and is unwilling to be hospitalized which I understand and he is of sound mind and understands the challenge and curing this and finding it presents as well.  We discussed the option of further amputation and he is unwilling to proceed with further significant surgery on the foot.  He is willing to treat with antibiotics.  I have placed a referral to infectious disease.  I would like to get a better culture than the sample we have from last week's swab visit.  He will return tomorrow to the office for our procedure room to undergo bone biopsy of the first metatarsal head under local anesthetic.  We discussed the risk benefits and potential complications of this.  Informed consent was signed and reviewed.  All questions were addressed.  Pathology microbiology culture of the tissue will be taken  No follow-ups on file.

## 2023-02-28 ENCOUNTER — Ambulatory Visit (INDEPENDENT_AMBULATORY_CARE_PROVIDER_SITE_OTHER): Payer: Medicare Other | Admitting: Podiatry

## 2023-02-28 DIAGNOSIS — M86472 Chronic osteomyelitis with draining sinus, left ankle and foot: Secondary | ICD-10-CM

## 2023-03-01 ENCOUNTER — Other Ambulatory Visit: Payer: Self-pay | Admitting: Podiatry

## 2023-03-03 ENCOUNTER — Other Ambulatory Visit: Payer: Self-pay

## 2023-03-03 NOTE — Progress Notes (Signed)
Patient presents today for biopsy of his left first metatarsal after gross dehiscence and infection of his surgical site.  The procedure and consent was reviewed with the patient.  All questions were addressed.  A local field block with lidocaine and Marcaine was performed with 2 cc each after sterile prep with chlorhexidine skin prep.  The foot was then prepped and draped with Betadine.  Sterile instrumentation and gown and gloves were donned and a rongeur was used to harvest a small piece of bony tissue.  This was sectioned in 2 pieces and 1 piece was sent for histopathology and a separate piece was sent for tissue culture.  A wound swab culture was also taken of the deep tissue.  The wound was irrigated and dressed with dry sterile dressings.  He tolerated the procedure well  Lanae Crumbly, DPM 02/28/23

## 2023-03-04 ENCOUNTER — Encounter: Payer: BC Managed Care – PPO | Admitting: Podiatry

## 2023-03-04 ENCOUNTER — Other Ambulatory Visit: Payer: Medicare Other

## 2023-03-04 VITALS — BP 86/50 | HR 86 | Temp 99.4°F

## 2023-03-04 DIAGNOSIS — Z515 Encounter for palliative care: Secondary | ICD-10-CM

## 2023-03-04 NOTE — Progress Notes (Signed)
COMMUNITY PALLIATIVE CARE SW NOTE  PATIENT NAME: Duane Hall DOB: Jun 26, 1956 MRN: [id number]  PRIMARY CARE PROVIDER: Pieter Partridge, PA (Inactive)  RESPONSIBLE PARTY:  Acct ID - Guarantor Home Phone Work Phone Relationship Acct Type  [id number] RAJINDER, [phone]  Self P/F     [address] [place], [place], [place] [address]   Palliative Care Visit/Clinical Social Work   SW and Nurse-D. Georgann Housekeeper completed an initial visit with patient at his home. Patient's wife-Cathy was present with him. The family was provided education regarding palliative care services, role in patient's care, visit frequency and they provided a folder with additional information and contact information. Patient signed consent form, consenting to services.   Patient sees his kidney and foot doctor weekly.  Patient has RBKA. Patient is currently on an antibiotic to treat infection in his foot.  He report that he is unable to eat because he vomits it back up. He admits that he does not have an appetite. He can eat some applesauce, pudding and occasional breakfast sandwich. Patient completes personal care staff to include doing his own showers.   Patient Goals: He does not want to go back to the hospital. He desires to go to [facility] when for EOL care.  Patient is a DNR and have a MOST form.   SOCIAL HX: Patient is married 35 years and have three sons. Patient is a retired Chief Financial Officer. Social History   Tobacco Use   Smoking status: Every Day    Packs/day: 0.50    Types: Cigarettes   Smokeless tobacco: Current   Tobacco comments:    Occasional E-cig   Substance Use Topics   Alcohol use: No    Alcohol/week: 0.0 standard drinks of alcohol    CODE STATUS: DNR ADVANCED DIRECTIVES: Yes MOST FORM COMPLETE:  Yes HOSPICE EDUCATION PROVIDED: Yes, education provided.  **Next appointment is April 18, 2023 at 12 pm (telephonic)  Duration of visit and documentation: 60 minutes  Katheren Puller, LCSW

## 2023-03-04 NOTE — Telephone Encounter (Signed)
Medication has been filled 

## 2023-03-04 NOTE — Progress Notes (Signed)
PATIENT NAME: Duane Hall DOB: March 06, 1956 MRN: [id number]  PRIMARY CARE PROVIDER: Pieter Partridge, PA (Inactive)  RESPONSIBLE PARTY:  Acct ID - Guarantor Home Phone Work Phone Relationship Acct Type  [id number] JOANNE, [phone]  Self P/F     [address] [place], [place],  [address]   Palliative Care Initial Encounter Note   Completed home visit w/Monica Alwyn Pea (wife) also present.     HISTORY OF PRESENT ILLNESS: ESRD  Cognitive: alert and oriented; pt is reserved and quiet; later during visit pt asks questions about what to expect at the end of life after stopping dialysis   Appetite: doesn't have an appetite and hasn't had one for "awhile"; vomits most foods; able to eat applesauce, pudding and rice; smoker  GI/GU: has a BM weekly; sometimes sooner; urinates but doesn't filter his urine   Mobility: LBKA; propels self in wheelchair  ADLs: independent   Sleeping Pattern: pt reports that he doesn't always sleep well; depending on how he feels throughout the day it dictates   Pain: denies pain at this time and doesn't normally have pain  Dialysis: In-home hemodialysis done by wife; 4 days per week; RUE fistula Frequently has hiccups since he began vomiting a lot; hiccups happened x3 during this visit  Palliative Care/ Hospice: RN explained role and purpose of palliative care including visit frequency. Also discussed benefits of hospice care as well as the differences between the two with patient. Educated patient and family on End of Life signs and symptoms  Goals of Care: To stay in the home with his wife for as long as possible and then go to [facility] for his last week or two of life  CODE STATUS: DNR ADVANCED DIRECTIVES: N MOST FORM: Y PPS: 50%   PHYSICAL EXAM:   VITALS: Today's Vitals   03/04/23 0952  BP: (!) 86/50  Pulse: 86  Temp: 99.4 F (37.4 C)  SpO2: 96%    LUNGS: clear to auscultation  CARDIAC: Cor  RRR EXTREMITIES: LBKA SKIN: no edema and temperature normal  NEURO: negative       Duane Hall Georgann Housekeeper, LPN

## 2023-03-05 LAB — ANAEROBIC AND AEROBIC CULTURE
MICRO NUMBER:: 14637708
MICRO NUMBER:: 14637709
SPECIMEN QUALITY:: ADEQUATE
SPECIMEN QUALITY:: ADEQUATE

## 2023-03-05 LAB — TIQ-NTM

## 2023-03-05 LAB — ILLEGIBLE FAX NUMB

## 2023-03-05 LAB — PATHOLOGIST SMEAR REVIEW

## 2023-03-06 ENCOUNTER — Telehealth: Payer: Self-pay | Admitting: Physician Assistant

## 2023-03-06 ENCOUNTER — Telehealth: Payer: Self-pay | Admitting: *Deleted

## 2023-03-06 ENCOUNTER — Encounter: Payer: Medicare Other | Admitting: Podiatry

## 2023-03-06 DIAGNOSIS — M86472 Chronic osteomyelitis with draining sinus, left ankle and foot: Secondary | ICD-10-CM

## 2023-03-06 NOTE — Telephone Encounter (Signed)
Noted, thanks!

## 2023-03-06 NOTE — Telephone Encounter (Signed)
[facility] CALLED REQUESTING A COPY OF THE requisition form With test code 3542 for Pt Duane Hall   Please fax to [phone]- Att: Maudie Mercury

## 2023-03-06 NOTE — Telephone Encounter (Signed)
[facility] Maudie Mercury) is calling for the date of service for the requisition submitted. Called back giving the date as 03/06/23,left voice message.

## 2023-03-06 NOTE — Addendum Note (Signed)
Addended bySherryle Lis, Reagan Klemz R on: 03/06/2023 11:12 AM   Modules accepted: Orders

## 2023-03-07 LAB — TISSUE SPECIMEN

## 2023-03-07 LAB — PATHOLOGY REPORT

## 2023-03-07 NOTE — Telephone Encounter (Signed)
[facility] is calling back for the site of the biopsy,date, doctor's name, information given that the biopsy of his left first metatarsal after gross dehiscence and infection of his surgical site , the doctor's name and date the biopsy was done.

## 2023-03-11 ENCOUNTER — Ambulatory Visit (INDEPENDENT_AMBULATORY_CARE_PROVIDER_SITE_OTHER): Payer: Medicare Other | Admitting: Podiatry

## 2023-03-11 DIAGNOSIS — L97524 Non-pressure chronic ulcer of other part of left foot with necrosis of bone: Secondary | ICD-10-CM | POA: Diagnosis not present

## 2023-03-12 ENCOUNTER — Encounter: Payer: Self-pay | Admitting: Internal Medicine

## 2023-03-12 ENCOUNTER — Ambulatory Visit (INDEPENDENT_AMBULATORY_CARE_PROVIDER_SITE_OTHER): Payer: Medicare Other | Admitting: Internal Medicine

## 2023-03-12 ENCOUNTER — Other Ambulatory Visit: Payer: Self-pay

## 2023-03-12 VITALS — BP 96/61 | HR 87 | Resp 16 | Ht 71.0 in | Wt 185.0 lb

## 2023-03-12 DIAGNOSIS — M8618 Other acute osteomyelitis, other site: Secondary | ICD-10-CM

## 2023-03-12 DIAGNOSIS — M869 Osteomyelitis, unspecified: Secondary | ICD-10-CM | POA: Diagnosis present

## 2023-03-12 DIAGNOSIS — L97513 Non-pressure chronic ulcer of other part of right foot with necrosis of muscle: Secondary | ICD-10-CM | POA: Diagnosis not present

## 2023-03-12 MED ORDER — AMOXICILLIN 500 MG PO TABS: 500.0000 mg | ORAL_TABLET | Freq: Two times a day (BID) | ORAL | 1 refills | Status: DC

## 2023-03-12 NOTE — Progress Notes (Signed)
  Subjective:  Patient ID: Duane Hall, male    DOB: Oct 09, 1956,  MRN: [id number]  Chief Complaint  Patient presents with   Foot Ulcer    POV #4 DOS 01/09/2023 LT GREAT TOE VS PARTIAL METATARSL AMPUTATION       67 y.o. male returns for post-op check.  He returns for follow-up after his biopsy on 02/28/2023.  Review of Systems: Negative except as noted in the HPI. Denies N/V/F/Ch.   Objective:  There were no vitals filed for this visit. There is no height or weight on file to calculate BMI. Constitutional Well developed. Well nourished.  Vascular Foot warm and well perfused. Capillary refill normal to all digits.  Calf is soft and supple, no posterior calf or knee pain, negative Homans' sign  Neurologic Normal speech. Oriented to person, place, and time. Epicritic sensation to light touch grossly absent bilaterally.  Dermatologic Still persistent cellulitis around distal medial forefoot.  Persistent ulcerations noted with fibrogranular tissue, serous drainage.  Still probes deep, measures 1.5 x 0.5 x 3.0 cm with draining sinus tract  Orthopedic: Tenderness to palpation noted about the surgical site.      Left first metatarsal path 02/28/23: Benign granulation tissue and soft tissue with  changes consistent with a healing wound.   - Fragments of benign bone with degenerative  changes and chronic inflammation in marrow  spaces, consistent with chronic osteomyelitis.   Left first metatarsal bone culture: Streptococcus agalactiae Abnormal    Comment: Light growth of Group B Streptococcus isolated Negative for inducible clindamycin resistance.  Resulting Agency [facility] [place]     Susceptibility   Streptococcus agalactiae    AEROBIC BACTERIA, CULT POSITIVE 1    AMPICILLIN <=0.25 Sensitive    CEFOTAXIME <=0.12 Sensitive    CEFTRIAXONE <=0.12 Sensitive    CLINDAMYCIN <=0.25 Sensitive    ERYTHROMYCIN 4 Resistant 1    LEVOFLOXACIN 1 Sensitive    PENO - penicillin  <=0.06 Sensitive 2    VANCOMYCIN 0.5 Sensitive           Assessment:   1. Ulcer of left foot with necrosis of bone ([place])    Plan:  Patient was evaluated and treated and all questions answered.  S/p foot surgery left -Continue home local wound care packing with silver alginate, dressing with 4 x 4 gauze and Curlex.  Change daily.  Resupply order sent to [facility] today -Currently on doxycycline and levofloxacin with renal dosing.  He has scheduled follow-up with infectious disease tomorrow.  IV antibiotics use may be difficult with his home dialysis, I will defer to Dr. Linus Salmons for further antibiotic recommendations -WBAT -Return in 3 weeks for follow-up with me here  No follow-ups on file.

## 2023-03-13 ENCOUNTER — Encounter: Payer: Self-pay | Admitting: Internal Medicine

## 2023-03-13 NOTE — Progress Notes (Signed)
[facility]      Reason for Consult:osteomyelitis    Referring Physician: Dr. Sherryle Lis    Patient ID: Duane Hall, male    DOB: 1956-09-01, 67 y.o.   MRN: [id number]  HPI:   He is here for evaluation of chronic osteomyelitis of his left great toe.  He recently underwent bone biopsy and culture of the area and found to have 2 positive cultures for group B Strep.  He has been on high dose levaquin and doxycycline and there has been little improvement in the ulcer.  He continues on home dialysis.  He is under palliative care.    Past Medical History:  Diagnosis Date   Asthma    Cancer ([place])    Cataract    CKD (chronic kidney disease), stage III ([place])    Dialysis S-M-W-Th at [facility]   Diabetes mellitus    Glaucoma    Hepatitis    Hep C   Hypertension    no longer   Osteomyelitis of great toe of left foot [facility])    Vascular insufficiency 05/2020    Prior to Admission medications   Medication Sig Start Date End Date Taking? Authorizing Provider  amoxicillin (AMOXIL) 500 MG tablet Take 1 tablet (500 mg total) by mouth 2 (two) times daily. 03/12/23  Yes Maitri Schnoebelen, Okey Regal, MD  aspirin EC 81 MG EC tablet Take 1 tablet (81 mg total) by mouth daily. Swallow whole. 06/21/20  Yes Charlynne Cousins, MD  clopidogrel (PLAVIX) 75 MG tablet Take 1 tablet (75 mg total) by mouth daily with breakfast. 06/21/20  Yes Charlynne Cousins, MD  Continuous Blood Gluc Sensor (DEXCOM G6 SENSOR) MISC 1 Device by Does not apply route See admin instructions. Change every 10 days 12/16/22  Yes Shamleffer, Melanie Crazier, MD  Continuous Blood Gluc Transmit (DEXCOM G6 TRANSMITTER) MISC Change every 3 mos 01/13/23  Yes Shamleffer, Melanie Crazier, MD  gentamicin ointment (GARAMYCIN) 0.1 % Apply 1 Application topically daily. 02/11/23  Yes McDonald, Stephan Minister, DPM  Insulin Disposable Pump (OMNIPOD 5 G6 POD, GEN 5,) MISC 1 Device by Does not apply route every 3 (three) days.  12/16/22  Yes Shamleffer, Melanie Crazier, MD  insulin lispro (HUMALOG) 100 UNIT/ML injection For use in pump, total of 60 units per day 09/01/22  Yes Shamleffer, Melanie Crazier, MD  iron sucrose in sodium chloride 0.9 % 100 mL Inject into the vein daily as needed (low iron). 04/25/21  Yes [provider]  Methoxy PEG-Epoetin Beta (MIRCERA IJ) Inject into the skin. Once a month if needed 04/23/21  Yes [provider]  pantoprazole (PROTONIX) 40 MG tablet Take 1 tablet (40 mg total) by mouth daily. 03/17/21  Yes Pokhrel, Laxman, MD  rosuvastatin (CRESTOR) 10 MG tablet TAKE 1 TABLET BY MOUTH DAILY 08/17/20  Yes Waynetta Sandy, MD    Allergies  Allergen Reactions   Amoxicillin-Pot Clavulanate Diarrhea   Midodrine Other (See Comments)    Other reaction(s): Urinary Sensation   Tape Other (See Comments)    Latex band aids cause blistering    Social History   Tobacco Use   Smoking status: Every Day    Packs/day: .5    Types: Cigarettes   Smokeless tobacco: Current    Types: Chew   Tobacco comments:    Occasional E-cig   Vaping Use   Vaping Use: Some days   Substances: Nicotine  Substance Use Topics   Alcohol use: No  Alcohol/week: 0.0 standard drinks of alcohol   Drug use: No    Family History  Problem Relation Age of Onset   Cancer Father    Diabetes Mother     Review of Systems  Constitutional: negative for fevers and chills All other systems reviewed and are negative    Constitutional: in no apparent distress  Vitals:   03/12/23 1530  BP: 96/61  Pulse: 87  Resp: 16  SpO2: 97%   EYES: anicteric Respiratory: normal respiratory effort Musculoskeletal: no edema  Labs: Lab Results  Component Value Date   WBC 8.9 11/07/2022   HGB 11.6 (L) 01/09/2023   HCT 34.0 (L) 01/09/2023   MCV 95.3 11/07/2022   PLT 177 11/07/2022    Lab Results  Component Value Date   CREATININE 6.60 (H) 01/09/2023   BUN 21 01/09/2023   NA 138 01/09/2023   K  2.9 (L) 01/09/2023   CL 94 (L) 01/09/2023   CO2 31 11/07/2022    Lab Results  Component Value Date   ALT 14 11/07/2022   AST 19 11/07/2022   ALKPHOS 89 11/07/2022   BILITOT 0.6 11/07/2022   INR 1.4 (H) 03/08/2021     Assessment: chronic osteomyelitis with GBS.  Positive in both cultures so will target the Strep with high dose amoxicillin, renally dosed.  Will plan for 6 weeks.  I discussed the difficulty in treatment of osteomyelitis in the foot and toes and antibiotics may not cure this.    Plan: 1)  amoxicillin Follow up in 3-4 weeks

## 2023-03-20 ENCOUNTER — Encounter: Payer: Medicare Other | Admitting: Podiatry

## 2023-03-25 ENCOUNTER — Encounter: Payer: Self-pay | Admitting: Podiatry

## 2023-04-01 ENCOUNTER — Ambulatory Visit (INDEPENDENT_AMBULATORY_CARE_PROVIDER_SITE_OTHER): Payer: Medicare Other | Admitting: Podiatry

## 2023-04-01 DIAGNOSIS — L97524 Non-pressure chronic ulcer of other part of left foot with necrosis of bone: Secondary | ICD-10-CM

## 2023-04-01 DIAGNOSIS — M86472 Chronic osteomyelitis with draining sinus, left ankle and foot: Secondary | ICD-10-CM

## 2023-04-04 ENCOUNTER — Ambulatory Visit (INDEPENDENT_AMBULATORY_CARE_PROVIDER_SITE_OTHER): Payer: Medicare Other | Admitting: Internal Medicine

## 2023-04-04 ENCOUNTER — Telehealth: Payer: Self-pay

## 2023-04-04 ENCOUNTER — Other Ambulatory Visit (HOSPITAL_COMMUNITY): Payer: Self-pay

## 2023-04-04 ENCOUNTER — Encounter: Payer: Self-pay | Admitting: Internal Medicine

## 2023-04-04 ENCOUNTER — Other Ambulatory Visit: Payer: Self-pay

## 2023-04-04 VITALS — BP 132/65 | HR 92 | Temp 98.1°F

## 2023-04-04 DIAGNOSIS — M8618 Other acute osteomyelitis, other site: Secondary | ICD-10-CM | POA: Diagnosis not present

## 2023-04-04 DIAGNOSIS — M869 Osteomyelitis, unspecified: Secondary | ICD-10-CM | POA: Diagnosis present

## 2023-04-04 LAB — CBC
HCT: 26.7 % — ABNORMAL LOW (ref 38.5–50.0)
Hemoglobin: 8.9 g/dL — ABNORMAL LOW (ref 13.2–17.1)
MCH: 30.6 pg (ref 27.0–33.0)
MCHC: 33.3 g/dL (ref 32.0–36.0)
MCV: 91.8 fL (ref 80.0–100.0)
MPV: 9.2 fL (ref 7.5–12.5)
Platelets: 142 10*3/uL (ref 140–400)
RBC: 2.91 10*6/uL — ABNORMAL LOW (ref 4.20–5.80)
RDW: 13.3 % (ref 11.0–15.0)
WBC: 4.6 10*3/uL (ref 3.8–10.8)

## 2023-04-04 LAB — SEDIMENTATION RATE: Sed Rate: 25 mm/h — ABNORMAL HIGH (ref 0–20)

## 2023-04-04 NOTE — Assessment & Plan Note (Signed)
Clinically does not seem to be improving despite the appropriate antibiotic for streptococcus.   At this point, the only other consideration would be to see if a dalbavancin infusion would benefit him and will arrange this - 1500 mg IV x 1 dose.   He can stop the amoxicillin once he receives the dalbavancin dose.  He will follow up after that to see if there is any improvement and utility in repeating the dose.  If no improvement, he can cancel the appointment as he will have maximized medical treatment.   I have personally spent 30 minutes involved in face-to-face and non-face-to-face activities for this patient on the day of the visit. Professional time spent includes the following activities: Preparing to see the patient (review of tests), Obtaining and/or reviewing separately obtained history (admission/discharge record), Performing a medically appropriate examination and/or evaluation , Ordering medications/tests/procedures, referring and communicating with other health care professionals, Documenting clinical information in the EMR, Independently interpreting results (not separately reported), Communicating results to the patient/family/caregiver, Counseling and educating the patient/family/caregiver and Care coordination (not separately reported).

## 2023-04-04 NOTE — Progress Notes (Signed)
   Subjective:    Patient ID: Duane Hall, male    DOB: Mar 25, 1956, 67 y.o.   MRN: [id number]  HPI Linton is here for follow up of osteomyelitis of his left great toe area.   He is followed by Dr. Lilian Kapur of podiatry and had a bone biopsy with group B Streptococcus and I started him on high dose amoxicillin, renally dosed. He continues with wound care and his wound is improving.   No issues with the amoxicillin with no rash or diarrhea.      Review of Systems  Constitutional:  Negative for fever.  Gastrointestinal:  Negative for diarrhea.  Skin:  Negative for rash.       Objective:   Physical Exam Eyes:     General: No scleral icterus. Pulmonary:     Effort: Pulmonary effort is normal.  Neurological:     Mental Status: He is alert.   SH: no tobacco        Assessment & Plan:

## 2023-04-04 NOTE — Telephone Encounter (Signed)
Per Dr.[name] patient needing Dalvance infusion done. Secure chat sent to [facility] infusion center staff waiting to hear back. Will follow up on Monday.   Thora Scherman Lesli Albee, CMA

## 2023-04-06 NOTE — Progress Notes (Signed)
  Subjective:  Patient ID: Duane Hall, male    DOB: [birth year],  MRN: [id number]  Chief Complaint  Patient presents with   Foot Ulcer    POV #5 DOS 01/09/2023 LT GREAT TOE VS PARTIAL METATARSL AMPUTATION       67 y.o. male returns for post-op check.  He returns for follow-up, he is receiving antibiotics, has about 2 weeks left  Review of Systems: Negative except as noted in the HPI. Denies N/V/F/Ch.   Objective:  There were no vitals filed for this visit. There is no height or weight on file to calculate BMI. Constitutional Well developed. Well nourished.  Vascular Foot warm and well perfused. Capillary refill normal to all digits.  Calf is soft and supple, no posterior calf or knee pain, negative Homans' sign  Neurologic Normal speech. Oriented to person, place, and time. Epicritic sensation to light touch grossly absent bilaterally.  Dermatologic No longer is cellulitic.  Persistent ulcerations noted with fibrogranular tissue, serous drainage.  Still probes deep, measures 1.0 x 0.5 x 3.5 cm with draining sinus tract  Orthopedic: He has no tenderness to palpation noted about the surgical site.         Left first metatarsal path 02/28/23: Benign granulation tissue and soft tissue with  changes consistent with a healing wound.   - Fragments of benign bone with degenerative  changes and chronic inflammation in marrow  spaces, consistent with chronic osteomyelitis.   Left first metatarsal bone culture: Streptococcus agalactiae Abnormal    Comment: Light growth of Group B Streptococcus isolated Negative for inducible clindamycin resistance.  Resulting Agency [facility] [place]     Susceptibility   Streptococcus agalactiae    AEROBIC BACTERIA, CULT POSITIVE 1    AMPICILLIN <=0.25 Sensitive    CEFOTAXIME <=0.12 Sensitive    CEFTRIAXONE <=0.12 Sensitive    CLINDAMYCIN <=0.25 Sensitive    ERYTHROMYCIN 4 Resistant 1    LEVOFLOXACIN 1 Sensitive    PENO -  penicillin <=0.06 Sensitive 2    VANCOMYCIN 0.5 Sensitive           Assessment:   1. Ulcer of left foot with necrosis of bone   2. Chronic osteomyelitis of left foot with draining sinus    Plan:  Patient was evaluated and treated and all questions answered.  S/p foot surgery left -He is continuing antibiotics for another 2 weeks.  I would like to see him following the end of this.  Has had slight improvement but still has quite a lot of depth.  We can determine after next visit how we would like to proceed further  Return in about 2 weeks (around 04/15/2023) for wound care.

## 2023-04-07 ENCOUNTER — Telehealth: Payer: Self-pay | Admitting: Pharmacy Technician

## 2023-04-07 ENCOUNTER — Other Ambulatory Visit: Payer: Self-pay | Admitting: Pharmacist

## 2023-04-07 NOTE — Telephone Encounter (Signed)
Patient scheduled for infusion on 4/9.

## 2023-04-07 NOTE — Telephone Encounter (Signed)
Infusion center on [address] has attempted to reach out to patient for scheduling. They left a voicemail asking for patient to return call.   Duane Hall Sagan Lesli Albee, CMA

## 2023-04-07 NOTE — Telephone Encounter (Signed)
[facility] note:  Auth Submission: NO AUTH NEEDED Site of care: Site of care: [facility] Payer: MEDICARE A&B/ BCBS COMMERCIAL Medication & CPT/J Code(s) submitted: Dalvance (Dalbavancin) [id number] Route of submission (phone, fax, portal):  Phone # Fax # Auth type: Buy/Bill Units/visits requested: 2 Reference number:  Approval from: 04/07/23 to 08/07/23   Patient has Medicare A/B and may be responsible for 20% co-insurance. Medicare will cover 80% and patient will be responsible for remaining 20%  BCBS commercial: patient will be responsible for 20%. Co-pay card: pending

## 2023-04-08 ENCOUNTER — Ambulatory Visit (INDEPENDENT_AMBULATORY_CARE_PROVIDER_SITE_OTHER): Payer: Medicare Other

## 2023-04-08 ENCOUNTER — Telehealth: Payer: Self-pay

## 2023-04-08 ENCOUNTER — Encounter: Payer: Self-pay | Admitting: Internal Medicine

## 2023-04-08 VITALS — BP 147/70 | HR 57 | Temp 98.0°F | Resp 20 | Ht 70.0 in | Wt 196.0 lb

## 2023-04-08 DIAGNOSIS — M86172 Other acute osteomyelitis, left ankle and foot: Secondary | ICD-10-CM | POA: Diagnosis not present

## 2023-04-08 MED ORDER — DEXTROSE 5 % IV SOLN
1500.0000 mg | Freq: Once | INTRAVENOUS | Status: AC
  Administered 2023-04-08: 1500 mg via INTRAVENOUS
  Filled 2023-04-08: qty 75

## 2023-04-08 NOTE — Progress Notes (Signed)
Diagnosis: Acute Osteomyelitis of left Calcaneus  Provider:  Chilton Greathouse MD  Procedure: Infusion  IV Type: Peripheral, IV Location: L Hand  Dalvance, Dose: 1500mg   Infusion Start Time: 1315  Infusion Stop Time: 1350  Post Infusion IV Care: Observation period completed and Peripheral IV Discontinued  Discharge: Condition: Good, Destination: Home . AVS Provided  Performed by:  Garnette Czech, RN

## 2023-04-08 NOTE — Telephone Encounter (Signed)
Received voicemail from patient's wife wanting to know if patient needed to continue with oral antibiotic now that he is receiving Dalvance infusion today.   Called patient's wife back, relayed that per Dr. Ephriam Knuckles office note, Toryn can stop the oral amoxicillin once he receives the Dalvance infusion. She verbalized understanding and has no further questions.   Sandie Ano, RN

## 2023-04-15 ENCOUNTER — Ambulatory Visit (INDEPENDENT_AMBULATORY_CARE_PROVIDER_SITE_OTHER): Payer: Medicare Other | Admitting: Podiatry

## 2023-04-15 DIAGNOSIS — L97524 Non-pressure chronic ulcer of other part of left foot with necrosis of bone: Secondary | ICD-10-CM

## 2023-04-15 DIAGNOSIS — M86472 Chronic osteomyelitis with draining sinus, left ankle and foot: Secondary | ICD-10-CM

## 2023-04-16 ENCOUNTER — Other Ambulatory Visit: Payer: Self-pay

## 2023-04-16 ENCOUNTER — Telehealth: Payer: Self-pay

## 2023-04-16 NOTE — Telephone Encounter (Signed)
Patient has been scheduled for second infusion 4/19. Staff relayed Dr. Ephriam Knuckles message regarding improvement to patient's wife.   Sandie Ano, RN

## 2023-04-16 NOTE — Telephone Encounter (Signed)
Secure chat sent to nursing staff at [facility] to assist with scheduling.   Sandie Ano, RN

## 2023-04-16 NOTE — Progress Notes (Signed)
  Subjective:  Patient ID: Duane Hall, male    DOB: 08-Mar-1956,  MRN: [id number]  Chief Complaint  Patient presents with   Foot Ulcer    2 week follow up left foot       67 y.o. male returns for post-op check.  He received a dose of dalbavancin at home, his wife notes that she thinks it is improved somewhat  Review of Systems: Negative except as noted in the HPI. Denies N/V/F/Ch.   Objective:  There were no vitals filed for this visit. There is no height or weight on file to calculate BMI. Constitutional Well developed. Well nourished.  Vascular Foot warm and well perfused. Capillary refill normal to all digits.  Calf is soft and supple, no posterior calf or knee pain, negative Homans' sign  Neurologic Normal speech. Oriented to person, place, and time. Epicritic sensation to light touch grossly absent bilaterally.  Dermatologic No longer is cellulitic.  Persistent ulcerations noted with fibrogranular tissue, serous drainage.  Still probes deep, measures 1.0 x 0.5 x 3.5 cm with draining sinus tract, not much change in dimensions but does have improvement in drainage, there is some granular tissue showing deeper  Orthopedic: He has no tenderness to palpation noted about the surgical site.            Left first metatarsal path 02/28/23: Benign granulation tissue and soft tissue with  changes consistent with a healing wound.   - Fragments of benign bone with degenerative  changes and chronic inflammation in marrow  spaces, consistent with chronic osteomyelitis.   Left first metatarsal bone culture: Streptococcus agalactiae Abnormal    Comment: Light growth of Group B Streptococcus isolated Negative for inducible clindamycin resistance.  Resulting Agency [facility] [place]     Susceptibility   Streptococcus agalactiae    AEROBIC BACTERIA, CULT POSITIVE 1    AMPICILLIN <=0.25 Sensitive    CEFOTAXIME <=0.12 Sensitive    CEFTRIAXONE <=0.12 Sensitive     CLINDAMYCIN <=0.25 Sensitive    ERYTHROMYCIN 4 Resistant 1    LEVOFLOXACIN 1 Sensitive    PENO - penicillin <=0.06 Sensitive 2    VANCOMYCIN 0.5 Sensitive           Assessment:   1. Ulcer of left foot with necrosis of bone   2. Chronic osteomyelitis of left foot with draining sinus    Plan:  Patient was evaluated and treated and all questions answered.  S/p foot surgery left -As she does seem to show some improvement with dalbavancin.  I debrided the ulceration today utilizing a sharp curette, he started to show some granulation tissue in the deeper portions.  Still has a sinus tract that does require packing with the Aquacel.  They will continue this at home.  I will see him back in 3 weeks for follow-up  No follow-ups on file.

## 2023-04-16 NOTE — Telephone Encounter (Signed)
-----   Message from Gardiner Barefoot, MD sent at 04/16/2023 12:46 PM EDT ----- I spoke with his podiatrist.  Sounds like he has had some improvement after dalbavancin for his osteomyelitis so I would like him to get the second dose as soon as possible - 1500 mg IV x 1.  Anytime tomorrow or as soon as possible.  thanks

## 2023-04-18 ENCOUNTER — Other Ambulatory Visit: Payer: Medicare Other

## 2023-04-18 ENCOUNTER — Ambulatory Visit (INDEPENDENT_AMBULATORY_CARE_PROVIDER_SITE_OTHER): Payer: Medicare Other | Admitting: *Deleted

## 2023-04-18 VITALS — BP 154/71 | HR 82 | Temp 97.8°F | Resp 16 | Ht 70.0 in | Wt 183.9 lb

## 2023-04-18 DIAGNOSIS — M86172 Other acute osteomyelitis, left ankle and foot: Secondary | ICD-10-CM

## 2023-04-18 MED ORDER — DEXTROSE 5 % IV SOLN
1500.0000 mg | Freq: Once | INTRAVENOUS | Status: AC
  Administered 2023-04-18: 1500 mg via INTRAVENOUS
  Filled 2023-04-18: qty 75

## 2023-04-18 NOTE — Progress Notes (Signed)
Diagnosis: Acute Osteomyelitis of left Calcaneus  Provider:  Chilton Greathouse MD  Procedure: Infusion  IV Type: Peripheral, IV Location: L Antecubital  Dalvance (Dalbavancin), Dose: 1500 mg  Infusion Start Time: 1426  Infusion Stop Time: 1505  Post Infusion IV Care: Patient declined observation and Peripheral IV Discontinued  Discharge: Condition: Good, Destination: Home . AVS Declined  Performed by:  Evelena Peat, RN

## 2023-04-22 ENCOUNTER — Ambulatory Visit (INDEPENDENT_AMBULATORY_CARE_PROVIDER_SITE_OTHER): Payer: Medicare Other | Admitting: Internal Medicine

## 2023-04-22 ENCOUNTER — Telehealth: Payer: Self-pay

## 2023-04-22 ENCOUNTER — Other Ambulatory Visit: Payer: Self-pay

## 2023-04-22 ENCOUNTER — Encounter: Payer: Self-pay | Admitting: Internal Medicine

## 2023-04-22 VITALS — BP 143/77 | HR 89 | Temp 98.6°F | Ht 70.0 in | Wt 188.0 lb

## 2023-04-22 DIAGNOSIS — M869 Osteomyelitis, unspecified: Secondary | ICD-10-CM | POA: Diagnosis present

## 2023-04-22 LAB — CBC WITH DIFFERENTIAL/PLATELET
Absolute Monocytes: 370 cells/uL (ref 200–950)
HCT: 31.2 % — ABNORMAL LOW (ref 38.5–50.0)
Lymphs Abs: 1171 cells/uL (ref 850–3900)
MCH: 30.1 pg (ref 27.0–33.0)
Platelets: 138 10*3/uL — ABNORMAL LOW (ref 140–400)
RBC: 3.39 10*6/uL — ABNORMAL LOW (ref 4.20–5.80)
WBC: 4.8 10*3/uL (ref 3.8–10.8)

## 2023-04-22 NOTE — Progress Notes (Signed)
Patient: Duane Hall  DOB: 03-22-1956 MRN: [id number] PCP: Roderick Pee, PA (Inactive)    Chief Complaint  Patient presents with   Follow-up    Reports he feels "inflated" since starting dalvance     Patient Active Problem List   Diagnosis Date Noted   Degeneration of lumbar intervertebral disc 05/18/2021   Drug induced constipation    Thrombocytopenia    Labile blood pressure    S/P BKA (below knee amputation) unilateral, right    Right BKA infection    Gangrene of right foot 03/16/2021   Peripheral arterial disease    Osteomyelitis of great toe of left foot    Sepsis 01/11/2021   Cellulitis of right foot 01/11/2021   Lumbar spondylosis 12/26/2020   Lumbar radiculopathy 10/05/2020   Left atrial dilation 08/12/2020   Acute osteomyelitis of left calcaneus    Bilateral pseudophakia 07/28/2020   Stable proliferative diabetic retinopathy of both eyes associated with type 2 diabetes mellitus 07/28/2020   Diabetic foot ulcer 07/27/2020   ESRD on hemodialysis 07/27/2020   Gastroparesis diabeticorum 07/02/2020   Vascular insufficiency of extremity 06/18/2020   Awaiting transplantation of kidney 06/18/2020   Hemodialysis-associated hypotension 05/01/2020   Aortic atherosclerosis 02/28/2020   Other disorders of phosphorus metabolism 10/14/2019   Secondary hyperparathyroidism of renal origin 10/14/2019   Complication of vascular dialysis catheter 10/09/2019   Hypercalcemia 10/07/2019   Hyperlipidemia, unspecified 10/07/2019   Tobacco use 10/07/2019   Unspecified glaucoma 10/07/2019   Coagulation defect, unspecified 10/05/2019   Hypokalemia 09/09/2019   Other emphysema 11/23/2018   Benign prostatic hyperplasia with weak urinary stream 11/18/2018   Pure hypercholesterolemia 11/18/2018   Uncontrolled type 2 diabetes mellitus with hyperglycemia 11/18/2018   Diabetic retinopathy 08/25/2015   Claudication 04/07/2015   Anemia of chronic disease 09/12/2014   Hepatitis C  10/15/2013   Pain in joint, shoulder region 02/10/2013   Diabetes mellitus type 2 in nonobese 02/24/2012   Hypertension 02/24/2012   Asthma 02/24/2012     Subjective:  Duane Hall is a 67 y.o. M with PMHx as below osteomyelitis of left great toe area.  Followed by podiatry Dr. Abbott Pao and had a bone biopsy growing group B strep.  He was subsequent started on high-dose amoxicillin but by infectious disease.  At 04/04/2023 was it with Dr. Verdie Drown noted that patient was not clinically improving despite being on appropriate antibiotics.  Dalvance infusion was added to see if it worked.  Seen by podiatry in the interim noted improvement in wound.  Patient received a second dose of Dalvance on 4/19. Today patient states he has not personally seen a change in his bone but notes podiatry and his wife noted that wound looks better.  Denies fevers or chills.  Denies any issues with tolerance with Dalvance.  Review of Systems  All other systems reviewed and are negative.   Past Medical History:  Diagnosis Date   Asthma    Cancer    Cataract    CKD (chronic kidney disease), stage III    Dialysis [facility]M-W-Th at [facility]   Diabetes mellitus    Glaucoma    Hepatitis    Hep C   Hypertension    no longer   Osteomyelitis of great toe of left foot    Vascular insufficiency 05/2020    Outpatient Medications Prior to Visit  Medication Sig Dispense Refill   aspirin EC 81 MG EC tablet Take 1 tablet (81 mg total) by mouth daily. Swallow whole.  30 tablet 11   clopidogrel (PLAVIX) 75 MG tablet Take 1 tablet (75 mg total) by mouth daily with breakfast. 30 tablet 3   Continuous Blood Gluc Sensor (DEXCOM G6 SENSOR) MISC 1 Device by Does not apply route See admin instructions. Change every 10 days 9 each 3   Insulin Disposable Pump (OMNIPOD 5 G6 POD, GEN 5,) MISC 1 Device by Does not apply route every 3 (three) days. 30 each 3   insulin lispro (HUMALOG) 100 UNIT/ML injection For use in pump, total of 60  units per day 60 mL 3   Methoxy PEG-Epoetin Beta (MIRCERA IJ) Inject into the skin. Once a month if needed     pantoprazole (PROTONIX) 40 MG tablet Take 1 tablet (40 mg total) by mouth daily.     rosuvastatin (CRESTOR) 10 MG tablet TAKE 1 TABLET BY MOUTH DAILY 30 tablet 11   amoxicillin (AMOXIL) 500 MG tablet Take 1 tablet (500 mg total) by mouth 2 (two) times daily. (Patient not taking: Reported on 04/22/2023) 60 tablet 1   Continuous Blood Gluc Transmit (DEXCOM G6 TRANSMITTER) MISC Change every 3 mos 1 each 3   gentamicin ointment (GARAMYCIN) 0.1 % Apply 1 Application topically daily. (Patient not taking: Reported on 04/22/2023) 15 g 0   iron sucrose in sodium chloride 0.9 % 100 mL Inject into the vein daily as needed (low iron). (Patient not taking: Reported on 04/22/2023)     No facility-administered medications prior to visit.     Allergies  Allergen Reactions   Amoxicillin-Pot Clavulanate Diarrhea   Midodrine Other (See Comments)    Other reaction(s): Urinary Sensation   Tape Other (See Comments)    Latex band aids cause blistering    Social History   Tobacco Use   Smoking status: Every Day    Packs/day: .3    Types: Cigarettes   Smokeless tobacco: Current    Types: Chew   Tobacco comments:    Occasional E-cig   Vaping Use   Vaping Use: Some days   Substances: Nicotine  Substance Use Topics   Alcohol use: No    Alcohol/week: 0.0 standard drinks of alcohol   Drug use: No    Family History  Problem Relation Age of Onset   Cancer Father    Diabetes Mother     Objective:   Vitals:   04/22/23 1110  BP: (!) 143/77  Pulse: 89  Temp: 98.6 F (37 C)  TempSrc: Temporal  SpO2: 100%  Weight: 188 lb (85.3 kg)  Height: 5\' 10"  (1.778 m)   Body mass index is 26.98 kg/m.  Physical Exam Constitutional:      General: He is not in acute distress.    Appearance: He is normal weight. He is not toxic-appearing.  HENT:     Head: Normocephalic and atraumatic.     Right  Ear: External ear normal.     Left Ear: External ear normal.     Nose: No congestion or rhinorrhea.     Mouth/Throat:     Mouth: Mucous membranes are moist.     Pharynx: Oropharynx is clear.  Eyes:     Extraocular Movements: Extraocular movements intact.     Conjunctiva/sclera: Conjunctivae normal.     Pupils: Pupils are equal, round, and reactive to light.  Cardiovascular:     Rate and Rhythm: Normal rate and regular rhythm.     Heart sounds: No murmur heard.    No friction rub. No gallop.  Pulmonary:  Effort: Pulmonary effort is normal.     Breath sounds: Normal breath sounds.  Abdominal:     General: Abdomen is flat. Bowel sounds are normal.     Palpations: Abdomen is soft.  Musculoskeletal:        General: No swelling. Normal range of motion.     Cervical back: Normal range of motion and neck supple.  Skin:    General: Skin is warm and dry.  Neurological:     General: No focal deficit present.     Mental Status: He is oriented to person, place, and time.  Psychiatric:        Mood and Affect: Mood normal.  Pt will not allow to look at wound as it is packed. Imaging reviewed from podiatry visit on 4/16  Lab Results: Lab Results  Component Value Date   WBC 4.6 04/04/2023   HGB 8.9 (L) 04/04/2023   HCT 26.7 (L) 04/04/2023   MCV 91.8 04/04/2023   PLT 142 04/04/2023    Lab Results  Component Value Date   CREATININE 6.60 (H) 01/09/2023   BUN 21 01/09/2023   NA 138 01/09/2023   K 2.9 (L) 01/09/2023   CL 94 (L) 01/09/2023   CO2 31 11/07/2022    Lab Results  Component Value Date   ALT 14 11/07/2022   AST 19 11/07/2022   ALKPHOS 89 11/07/2022   BILITOT 0.6 11/07/2022     Assessment & Plan:  #Osteomyelitis of left great toes -Tolerating antibiotics. -Wound improved based on podiatry eval. Pt will not allow me to look at wound as we do not have packing material.  -Will plan on 3rd dose of dalvance given improvement to complete 6 weeks of abx therapy for  osteo. -He noted "I would rather die than go to back to the hospital", " If this"foot" does not get better then I am ready to die" in regards to any further intervention involving hospitalization/surgery.  Plan: -3rd dose of Dalvance on 05/01/23 -Labs today -F/U with Dr. Luciana Axe on 5/15 -Podiatry F/U on 5/24   Danelle Earthly, MD [facility]   04/22/23  11:23 AM   I have personally spent47  minutes involved in face-to-face and non-face-to-face activities for this patient on the day of the visit. Professional time spent includes the following activities: Preparing to see the patient (review of tests), Obtaining and/or reviewing separately obtained history (admission/discharge record), Performing a medically appropriate examination and/or evaluation , Ordering medications/tests/procedures, referring and communicating with other health care professionals, Documenting clinical information in the EMR, Independently interpreting results (not separately reported), Communicating results to the patient/family/caregiver, Counseling and educating the patient/family/caregiver and Care coordination (not separately reported).

## 2023-04-22 NOTE — Telephone Encounter (Signed)
Patient will need third dose of dalvance 05/01/23 per Dr. Thedore Mins. Secure chat sent to W. Research officer, political party for assistance with scheduling.   Sandie Ano, RN

## 2023-04-23 LAB — CBC WITH DIFFERENTIAL/PLATELET
Basophils Absolute: 48 cells/uL (ref 0–200)
Basophils Relative: 1 %
Eosinophils Absolute: 230 cells/uL (ref 15–500)
Eosinophils Relative: 4.8 %
Hemoglobin: 10.2 g/dL — ABNORMAL LOW (ref 13.2–17.1)
MCHC: 32.7 g/dL (ref 32.0–36.0)
MCV: 92 fL (ref 80.0–100.0)
MPV: 9.5 fL (ref 7.5–12.5)
Monocytes Relative: 7.7 %
Neutro Abs: 2981 cells/uL (ref 1500–7800)
Neutrophils Relative %: 62.1 %
RDW: 13.8 % (ref 11.0–15.0)
Total Lymphocyte: 24.4 %

## 2023-04-23 LAB — COMPLETE METABOLIC PANEL WITH GFR
AG Ratio: 1.6 (calc) (ref 1.0–2.5)
ALT: 8 U/L — ABNORMAL LOW (ref 9–46)
AST: 13 U/L (ref 10–35)
Albumin: 3.9 g/dL (ref 3.6–5.1)
Alkaline phosphatase (APISO): 121 U/L (ref 35–144)
BUN/Creatinine Ratio: 5 (calc) — ABNORMAL LOW (ref 6–22)
BUN: 22 mg/dL (ref 7–25)
CO2: 31 mmol/L (ref 20–32)
Calcium: 8.3 mg/dL — ABNORMAL LOW (ref 8.6–10.3)
Chloride: 99 mmol/L (ref 98–110)
Creat: 4.75 mg/dL — ABNORMAL HIGH (ref 0.70–1.35)
Globulin: 2.4 g/dL (calc) (ref 1.9–3.7)
Glucose, Bld: 156 mg/dL — ABNORMAL HIGH (ref 65–99)
Potassium: 3.1 mmol/L — ABNORMAL LOW (ref 3.5–5.3)
Sodium: 140 mmol/L (ref 135–146)
Total Bilirubin: 0.4 mg/dL (ref 0.2–1.2)
Total Protein: 6.3 g/dL (ref 6.1–8.1)
eGFR: 13 mL/min/{1.73_m2} — ABNORMAL LOW (ref >=60)

## 2023-04-23 LAB — SEDIMENTATION RATE: Sed Rate: 19 mm/h (ref 0–20)

## 2023-04-23 LAB — C-REACTIVE PROTEIN: CRP: <3 mg/L (ref <8.0)

## 2023-04-23 NOTE — Telephone Encounter (Signed)
Patient has been scheduled for third infusion 05/01/23.  Sandie Ano, RN

## 2023-04-27 ENCOUNTER — Encounter: Payer: Self-pay | Admitting: Internal Medicine

## 2023-04-28 ENCOUNTER — Encounter: Payer: Self-pay | Admitting: Internal Medicine

## 2023-04-28 ENCOUNTER — Telehealth: Payer: Self-pay | Admitting: Pharmacy Technician

## 2023-04-28 ENCOUNTER — Other Ambulatory Visit (HOSPITAL_COMMUNITY): Payer: Self-pay

## 2023-04-28 NOTE — Telephone Encounter (Signed)
Pharmacy Patient Advocate Encounter   Received notification from Fax/CMM that prior authorization for Omnipod is required/requested.   PA submitted on 04/28/23 to (ins) [employer] via CoverMyMeds [employer] or [facility]) confirmation #  [id number] Status is pending

## 2023-04-29 ENCOUNTER — Encounter: Payer: Self-pay | Admitting: Internal Medicine

## 2023-04-29 ENCOUNTER — Ambulatory Visit (INDEPENDENT_AMBULATORY_CARE_PROVIDER_SITE_OTHER): Payer: Medicare Other | Admitting: Internal Medicine

## 2023-04-29 VITALS — BP 116/74 | HR 96 | Ht 70.0 in | Wt 187.0 lb

## 2023-04-29 DIAGNOSIS — Z89511 Acquired absence of right leg below knee: Secondary | ICD-10-CM

## 2023-04-29 DIAGNOSIS — Z992 Dependence on renal dialysis: Secondary | ICD-10-CM

## 2023-04-29 DIAGNOSIS — E113593 Type 2 diabetes mellitus with proliferative diabetic retinopathy without macular edema, bilateral: Secondary | ICD-10-CM | POA: Diagnosis not present

## 2023-04-29 DIAGNOSIS — Z794 Long term (current) use of insulin: Secondary | ICD-10-CM

## 2023-04-29 DIAGNOSIS — S98112A Complete traumatic amputation of left great toe, initial encounter: Secondary | ICD-10-CM | POA: Insufficient documentation

## 2023-04-29 DIAGNOSIS — E1122 Type 2 diabetes mellitus with diabetic chronic kidney disease: Secondary | ICD-10-CM

## 2023-04-29 DIAGNOSIS — N186 End stage renal disease: Secondary | ICD-10-CM

## 2023-04-29 LAB — POCT GLYCOSYLATED HEMOGLOBIN (HGB A1C): Hemoglobin A1C: 5.6 % (ref 4.0–5.6)

## 2023-04-29 MED ORDER — OMNIPOD 5 DEXG7G6 PODS GEN 5 MISC: 1.0000 | 3 refills | Status: DC

## 2023-04-29 MED ORDER — DEXCOM G6 SENSOR MISC: 1.0000 | 3 refills | Status: DC

## 2023-04-29 MED ORDER — DEXCOM G6 TRANSMITTER MISC: 3 refills | Status: DC

## 2023-04-29 MED ORDER — INSULIN LISPRO 100 UNIT/ML IJ SOLN: INTRAMUSCULAR | 3 refills | Status: DC

## 2023-04-29 NOTE — Telephone Encounter (Signed)
Pharmacy Patient Advocate Encounter  Prior Authorization has been approved  Effective dates: 04/28/2023 through 04/27/2024   

## 2023-04-29 NOTE — Telephone Encounter (Signed)
Patient was seen today please proceed with prior authorization   Thanks

## 2023-04-29 NOTE — Patient Instructions (Signed)

## 2023-04-29 NOTE — Progress Notes (Signed)
Name: Duane Hall  Age/ Sex: 67 y.o., male   MRN/ DOB: [id number], 1956-09-19     PCP: Roderick Pee, PA (Inactive)   Reason for Endocrinology Evaluation: Type 2 Diabetes Mellitus  Initial Endocrine Consultative Visit: 08/16/2020    PATIENT IDENTIFIER: [name] is a 67 y.o. male with a past medical history of HTN, ESRD on HD, PVD, asthma, gastroparesis. The patient has followed with Endocrinology clinic since 08/16/2020 for consultative assistance with management of his diabetes.  DIABETIC HISTORY:  [name] was diagnosed with DM 2005, and started insulin therapy in 2021, omnipod started 12/2020. Intolerant to Trulicity due to gastroparesis. His hemoglobin A1c has ranged from 5.6% in 2023, peaking at 11.8% in 2016.  He was followed by Dr. Everardo All on 2021 until 04/2022 SUBJECTIVE:   During the last visit (08/30/2022): A1c 6.0%  Today (04/29/2023): [name] is here for follow-up on diabetes management.Marland Kitchen  He checks his blood sugars multiple times daily, through CGM.  The patient has  not had hypoglycemic episodes since the last clinic visit.  Pt follows with ID for left great toe osteomyelitis . S/P left great toe amputation 01/09/2023  Was seen by Podiatry  04/15/2023 GI symptoms are tolerable, with no vomiting in the past 3-4 weeks  Has occasional constipation or diarrhea   Pt on home dialysis Sunday, Monday, Thursday and Wednesday    This patient with type 2 diabetes is treated with OmniPod 5 (insulin pump). During the visit the pump basal and bolus doses were reviewed including carb/insulin rations and supplemental doses. The clinical list was updated. The glucose meter download was reviewed in detail to determine if the current pump settings are providing the best glycemic control without excessive hypoglycemia.  Pump and meter download:       Pump   OmniPod 5  Settings   Insulin type   Humalog    Basal rate          0000 0.10 u/h     0600 0.15 u/h     0900  0.30    2100 0.15 u/h          I:C ratio          0000  1:5                              Sensitivity          0000  50         Goal          00 00  110             Type & Model of Pump: OmniPod 5 Insulin Type: Currently using Humalog.  Body mass index is 26.83 kg/m.  PUMP STATISTICS: Average BG: 172 Average Daily Carbs (g): 51.8 Average Total Daily Insulin: 30 Average Daily Basal: 20.5 (68 %) Average Daily Bolus: 9.5 (32 %)    CONTINUOUS GLUCOSE MONITORING RECORD INTERPRETATION    Dates of Recording: 4/17-4/30/2024  Sensor description:dexcom  Results statistics:   CGM use % of time 93.9  Average and SD 172/42  Time in range    59    %  % Time Above 180 38  % Time above 250 3  % Time Below target 0   Glycemic patterns summary: Bg's trend down overnight and increase during the day   Hyperglycemic episodes  postprandial   Hypoglycemic episodes occurred  variable   Overnight periods: trend  down    HOME DIABETES REGIMEN:  Humalog   Statin: Yes ACE-I/ARB: no    DIABETIC COMPLICATIONS: Microvascular complications:  Proliferative DR ( S/P laser) , ESRD on dialysis ,Right BKA , left great toe amputation Last Eye Exam: Completed 2022   Macrovascular complications:  PVD Denies: CAD, CVA   HISTORY:  Past Medical History:  Past Medical History:  Diagnosis Date   Asthma    Cancer (HCC)    Cataract    CKD (chronic kidney disease), stage III (HCC)    Dialysis [facility] at [facility]   Diabetes mellitus    Glaucoma    Hepatitis    Hep C   Hypertension    no longer   Osteomyelitis of great toe of left foot (HCC)    Vascular insufficiency 05/2020   Past Surgical History:  Past Surgical History:  Procedure Laterality Date   ABDOMINAL AORTOGRAM W/LOWER EXTREMITY N/A 06/19/2020   Procedure: ABDOMINAL AORTOGRAM W/LOWER EXTREMITY;  Surgeon: Maeola Harman, MD;  Location: [facility] INVASIVE CV LAB;  Service: Cardiovascular;   Laterality: N/A;   ABDOMINAL AORTOGRAM W/LOWER EXTREMITY Left 10/16/2020   Procedure: ABDOMINAL AORTOGRAM W/LOWER EXTREMITY;  Surgeon: Maeola Harman, MD;  Location: [facility] INVASIVE CV LAB;  Service: Cardiovascular;  Laterality: Left;   ABDOMINAL AORTOGRAM W/LOWER EXTREMITY N/A 12/25/2020   Procedure: ABDOMINAL AORTOGRAM W/LOWER EXTREMITY;  Surgeon: Maeola Harman, MD;  Location: [facility] INVASIVE CV LAB;  Service: Cardiovascular;  Laterality: N/A;   ABDOMINAL AORTOGRAM W/LOWER EXTREMITY N/A 10/21/2022   Procedure: ABDOMINAL AORTOGRAM W/LOWER EXTREMITY;  Surgeon: Maeola Harman, MD;  Location: [facility] INVASIVE CV LAB;  Service: Cardiovascular;  Laterality: N/A;   AMPUTATION Right 01/12/2021   Procedure: 1ST AMPUTATION RAY;  Surgeon: Edwin Cap, DPM;  Location: [facility] OR;  Service: Podiatry;  Laterality: Right;   AMPUTATION Right 03/13/2021   Procedure: RIGHT BELOW KNEE AMPUTATION;  Surgeon: Maeola Harman, MD;  Location: [facility] OR;  Service: Vascular;  Laterality: Right;   AMPUTATION TOE Left 11/08/2022   Procedure: AMPUTATION TOE;  Surgeon: Edwin Cap, DPM;  Location: [facility] ORS;  Service: Podiatry;  Laterality: Left;  DR MCDONALD WILL BLOCK, ON STRETCHER   AMPUTATION TOE Left 01/09/2023   Procedure: AMPUTATION OF REMAINING LEFT GREAT TOE AND BONE BEHIND TOE;  Surgeon: Edwin Cap, DPM;  Location: [facility] OR;  Service: Podiatry;  Laterality: Left;   AV FISTULA PLACEMENT Right 09/14/2019   Procedure: Right Arm Basilic Vein transposition;  Surgeon: Chuck Hint, MD;  Location: [facility] OR;  Service: Vascular;  Laterality: Right;   BONE BIOPSY Left 07/29/2020   Procedure: BONE BIOPSY;  Surgeon: Edwin Cap, DPM;  Location: [facility] OR;  Service: Podiatry;  Laterality: Left;  Need bone trephines and/or large bore Giamshidi   COLONOSCOPY     COLONOSCOPY WITH PROPOFOL N/A 12/07/2021   Procedure: COLONOSCOPY WITH PROPOFOL;  Surgeon: Jeani Hawking, MD;  Location: [facility]  ENDOSCOPY;  Service: Endoscopy;  Laterality: N/A;   PERIPHERAL VASCULAR ATHERECTOMY Left 06/19/2020   Procedure: PERIPHERAL VASCULAR ATHERECTOMY;  Surgeon: Maeola Harman, MD;  Location: [facility] INVASIVE CV LAB;  Service: Cardiovascular;  Laterality: Left;  SFA   PERIPHERAL VASCULAR ATHERECTOMY  12/25/2020   Procedure: PERIPHERAL VASCULAR ATHERECTOMY;  Surgeon: Maeola Harman, MD;  Location: [facility] INVASIVE CV LAB;  Service: Cardiovascular;;  Lt. PT - Laser Lt. SFA - Laser   PERIPHERAL VASCULAR BALLOON ANGIOPLASTY  12/25/2020   Procedure: PERIPHERAL VASCULAR BALLOON ANGIOPLASTY;  Surgeon: Maeola Harman, MD;  Location: [facility] INVASIVE CV LAB;  Service: Cardiovascular;;  Lt. SFA and PT   PERIPHERAL VASCULAR BALLOON ANGIOPLASTY Left 10/21/2022   Procedure: PERIPHERAL VASCULAR BALLOON ANGIOPLASTY;  Surgeon: Maeola Harman, MD;  Location: [facility] INVASIVE CV LAB;  Service: Cardiovascular;  Laterality: Left;   PERIPHERAL VASCULAR INTERVENTION Left 10/21/2022   Procedure: PERIPHERAL VASCULAR INTERVENTION;  Surgeon: Maeola Harman, MD;  Location: [facility] INVASIVE CV LAB;  Service: Cardiovascular;  Laterality: Left;   POLYPECTOMY  12/07/2021   Procedure: POLYPECTOMY;  Surgeon: Jeani Hawking, MD;  Location: [facility] ENDOSCOPY;  Service: Endoscopy;;   UPPER GASTROINTESTINAL ENDOSCOPY  02/2019   Dr Elnoria Howard     WISDOM TOOTH EXTRACTION     Social History:  reports that he has been smoking cigarettes. He has been smoking an average of .3 packs per day. His smokeless tobacco use includes chew. He reports that he does not drink alcohol and does not use drugs. Family History:  Family History  Problem Relation Age of Onset   Cancer Father    Diabetes Mother      HOME MEDICATIONS: Allergies as of 04/29/2023       Reactions   Amoxicillin-pot Clavulanate Diarrhea   Midodrine Other (See Comments)   Other reaction(s): Urinary Sensation   Tape Other (See Comments)   Latex band aids  cause blistering        Medication List        Accurate as of April 29, 2023 12:57 PM. If you have any questions, ask your nurse or doctor.          amoxicillin 500 MG tablet Commonly known as: AMOXIL Take 1 tablet (500 mg total) by mouth 2 (two) times daily.   aspirin EC 81 MG tablet Take 1 tablet (81 mg total) by mouth daily. Swallow whole.   clopidogrel 75 MG tablet Commonly known as: PLAVIX Take 1 tablet (75 mg total) by mouth daily with breakfast.   Dexcom G6 Sensor Misc 1 Device by Does not apply route See admin instructions. Change every 10 days   Dexcom G6 Transmitter Misc Change every 3 mos   gentamicin ointment 0.1 % Commonly known as: GARAMYCIN Apply 1 Application topically daily.   insulin lispro 100 UNIT/ML injection Commonly known as: HumaLOG For use in pump, total of 60 units per day   iron sucrose in sodium chloride 0.9 % 100 mL Inject into the vein daily as needed (low iron).   MIRCERA IJ Inject into the skin. Once a month if needed   Omnipod 5 G6 Pods (Gen 5) Misc 1 Device by Does not apply route every 3 (three) days.   pantoprazole 40 MG tablet Commonly known as: PROTONIX Take 1 tablet (40 mg total) by mouth daily.   rosuvastatin 10 MG tablet Commonly known as: CRESTOR TAKE 1 TABLET BY MOUTH DAILY         OBJECTIVE:   Vital Signs: BP 116/74 (BP Location: Left Arm, Patient Position: Sitting, Cuff Size: Large)   Pulse 96   Ht 5\' 10"  (1.778 m)   Wt 187 lb (84.8 kg)   SpO2 97%   BMI 26.83 kg/m   Wt Readings from Last 3 Encounters:  04/29/23 187 lb (84.8 kg)  04/22/23 188 lb (85.3 kg)  04/18/23 183 lb 14.4 oz (83.4 kg)     Exam: General: Pt appears well and is in NAD  Lungs: Clear with good BS bilat   Heart: RRR   Abdomen:  soft, nontender  Extremities: Right BKA  Neuro: MS is good with appropriate affect, pt is alert and Ox3      DATA REVIEWED:  Lab Results  Component Value Date   HGBA1C 5.7 (H) 11/07/2022    HGBA1C 6.0 (A) 08/30/2022   HGBA1C 5.6 04/30/2022     Latest Reference Range & Units 04/22/23 11:43  Sodium 135 - 146 mmol/L 140  Potassium 3.5 - 5.3 mmol/L 3.1 (L)  Chloride 98 - 110 mmol/L 99  CO2 20 - 32 mmol/L 31  Glucose 65 - 99 mg/dL 960 (H)  BUN 7 - 25 mg/dL 22  Creatinine 4.54 - 0.98 mg/dL 1.19 (H)  Calcium 8.6 - 10.3 mg/dL 8.3 (L)  BUN/Creatinine Ratio 6 - 22 (calc) 5 (L)  eGFR > OR = 60 mL/min/1.36m2 13 (L)  AG Ratio 1.0 - 2.5 (calc) 1.6  AST 10 - 35 U/L 13  ALT 9 - 46 U/L 8 (L)  Total Protein 6.1 - 8.1 g/dL 6.3  Total Bilirubin 0.2 - 1.2 mg/dL 0.4  (L): Data is abnormally low (H): Data is abnormally high  ASSESSMENT / PLAN / RECOMMENDATIONS:   1) Type 2 Diabetes Mellitus, optimally controlled, With retinopathic complications, ESRD on HD and S/P BKA- Most recent A1c of 5.6 %. Goal A1c < 7.0 %.     - A1c skewed due to ESRD - CGM average is equivalent to an A1c of 7.4%  -In reviewing his pump/CGM download, the patient has been noted with inconsistent bolus.  For example this morning he had a crescent sandwich and did not bolus for it, patient states that he does not bolus because it does not make a difference, as hyperglycemia persists.  I did explain to the patient that he missed out on an opportunity of receiving 6 units of insulin with his breakfast today -In office BG was 219 mg/DL, the patient manually bolused upon arrival to the office due to hyperglycemia, he initially gave himself 3 units, and subsequently 2 additional units.  His insulin on board was 5 units at BG  219 mg/DL -I have offered the patient to see our CDE for a refresher and pump use, as I tried to explain to him that manually bolusing causes glycemic excursions with persistent hyperglycemia or hypoglycemia, but he did seem to think that what he is doing is the correct choice for him. -I will change his sensitivity factor as below -No other changes to the pump at this time    MEDICATIONS: Humalog      Pump   OmniPod 5  Settings   Insulin type   Humalog   Basal rate       0000 0.10 u/h    0600 0.15 u/h    0900 0.35   2100 0.15 u/h       I:C ratio       0000  1:5                  Sensitivity       0000  40      Goal       0000  110          EDUCATION / INSTRUCTIONS: BG monitoring instructions: Patient is instructed to check his blood sugars 3 times a day, before each meal . Call [facility] if: BG persistently < 70  I reviewed the Rule of 15 for the treatment of hypoglycemia in detail with the patient. Literature supplied.    2) Diabetic complications:  Eye: Does  have  known diabetic retinopathy.  Neuro/ Feet: Does not have known diabetic peripheral neuropathy .  Renal: Patient does  have known baseline CKD. He   is not on an ACEI/ARB at present.       F/U in 6 months    I spent 35 minutes preparing to see the patient by review of recent labs, imaging and procedures, obtaining and reviewing separately obtained history, communicating with the patient, ordering medications, tests or procedures, and documenting clinical information in the EHR including the differential Dx, treatment, and any further evaluation and other management   Signed electronically by: Lyndle Herrlich, MD  [facility]  [address] [facility] [address] [place] [place], [place] [address] Phone: [phone] FAX: [phone]   CC: Roderick Pee, PA (Inactive) [address] [place] [address] Phone: [phone]  Fax: [phone]  Return to Endocrinology clinic as below: Future Appointments  Date Time Provider Department Center  04/29/2023  1:00 PM Oaklan Persons, Konrad Dolores, MD [facility] None  05/01/2023  9:45 AM [facility] 5 [facility] None  05/13/2023  4:15 PM Edwin Cap, DPM [facility]  05/20/2023  2:30 PM [name], Belia Heman, MD [facility]  05/28/2023  1:00 PM [facility] VASC 4 - SS [facility]   05/28/2023  1:30 PM [facility] VASC 4 - SS [facility]  05/28/2023  2:15 PM [facility] PA-2 [facility]

## 2023-05-01 ENCOUNTER — Ambulatory Visit (INDEPENDENT_AMBULATORY_CARE_PROVIDER_SITE_OTHER): Payer: Medicare Other

## 2023-05-01 VITALS — BP 159/77 | HR 93 | Temp 98.3°F | Resp 18 | Ht 70.0 in | Wt 189.8 lb

## 2023-05-01 DIAGNOSIS — M86172 Other acute osteomyelitis, left ankle and foot: Secondary | ICD-10-CM

## 2023-05-01 MED ORDER — DEXTROSE 5 % IV SOLN
1500.0000 mg | Freq: Once | INTRAVENOUS | Status: AC
  Administered 2023-05-01: 1500 mg via INTRAVENOUS
  Filled 2023-05-01: qty 75

## 2023-05-01 NOTE — Progress Notes (Signed)
Diagnosis: Acute osteomyelitis of left calcaneus   Provider:  Chilton Greathouse MD  Procedure: IV Infusion  IV Type: Peripheral, IV Location: L Antecubital  Dalvance (Dalbavancin), Dose: 1500mg   Infusion Start Time: 0958  Infusion Stop Time: 1040  Post Infusion IV Care: Peripheral IV Discontinued  Discharge: Condition: Good, Destination: Home . AVS Declined  Performed by:  Garnette Czech, RN

## 2023-05-05 ENCOUNTER — Other Ambulatory Visit: Payer: Self-pay

## 2023-05-05 DIAGNOSIS — E1122 Type 2 diabetes mellitus with diabetic chronic kidney disease: Secondary | ICD-10-CM

## 2023-05-05 MED ORDER — DEXCOM G6 SENSOR MISC
1.0000 | 3 refills | Status: DC
Start: 2023-05-05 — End: 2024-05-04

## 2023-05-05 MED ORDER — OMNIPOD 5 DEXG7G6 PODS GEN 5 MISC
1.0000 | 3 refills | Status: DC
Start: 2023-05-05 — End: 2024-11-05

## 2023-05-05 MED ORDER — INSULIN LISPRO 100 UNIT/ML IJ SOLN: INTRAMUSCULAR | 3 refills | Status: DC

## 2023-05-05 MED ORDER — DEXCOM G6 TRANSMITTER MISC
3 refills | Status: DC
Start: 2023-05-05 — End: 2024-05-04

## 2023-05-07 ENCOUNTER — Encounter: Payer: Self-pay | Admitting: Internal Medicine

## 2023-05-13 ENCOUNTER — Ambulatory Visit (INDEPENDENT_AMBULATORY_CARE_PROVIDER_SITE_OTHER): Payer: Medicare Other | Admitting: Podiatry

## 2023-05-13 DIAGNOSIS — M86472 Chronic osteomyelitis with draining sinus, left ankle and foot: Secondary | ICD-10-CM | POA: Diagnosis not present

## 2023-05-13 DIAGNOSIS — L97524 Non-pressure chronic ulcer of other part of left foot with necrosis of bone: Secondary | ICD-10-CM

## 2023-05-14 ENCOUNTER — Ambulatory Visit: Payer: Medicare Other | Admitting: Internal Medicine

## 2023-05-14 NOTE — Progress Notes (Signed)
  Subjective:  Patient ID: Duane Hall, male    DOB: [birth year],  MRN: [id number]  Chief Complaint  Patient presents with   Foot Ulcer    3 week follow up left foot       67 y.o. male returns for post-op check.  He had 1 final dose of Dalvance since I last saw him  Review of Systems: Negative except as noted in the HPI. Denies N/V/F/Ch.   Objective:  There were no vitals filed for this visit. There is no height or weight on file to calculate BMI. Constitutional Well developed. Well nourished.  Vascular Foot warm and well perfused. Capillary refill normal to all digits.  Calf is soft and supple, no posterior calf or knee pain, negative Homans' sign  Neurologic Normal speech. Oriented to person, place, and time. Epicritic sensation to light touch grossly absent bilaterally.  Dermatologic No longer is cellulitic.  Persistent ulcerations noted with fibrogranular tissue, serous drainage.  Still probes deep, measures 0.5 x 0.3 x 0.6 cm with sinus tract, has a firm end feel now, only minimal drainage,  Orthopedic: He has no tenderness to palpation noted about the surgical site.   , On the medial side there is a small scab measuring         Left first metatarsal path 02/28/23: Benign granulation tissue and soft tissue with  changes consistent with a healing wound.   - Fragments of benign bone with degenerative  changes and chronic inflammation in marrow  spaces, consistent with chronic osteomyelitis.   Left first metatarsal bone culture: Streptococcus agalactiae Abnormal    Comment: Light growth of Group B Streptococcus isolated Negative for inducible clindamycin resistance.  Resulting Agency [facility] [place]     Susceptibility   Streptococcus agalactiae    AEROBIC BACTERIA, CULT POSITIVE 1    AMPICILLIN <=0.25 Sensitive    CEFOTAXIME <=0.12 Sensitive    CEFTRIAXONE <=0.12 Sensitive    CLINDAMYCIN <=0.25 Sensitive    ERYTHROMYCIN 4 Resistant 1     LEVOFLOXACIN 1 Sensitive    PENO - penicillin <=0.06 Sensitive 2    VANCOMYCIN 0.5 Sensitive           Assessment:   1. Ulcer of left foot with necrosis of bone (HCC)   2. Chronic osteomyelitis of left foot with draining sinus (HCC)    Plan:  Patient was evaluated and treated and all questions answered.  S/p foot surgery left -Clinically has had quite a bit of improvement.  Has had final dose of Dalvance.  Wound does still have 2 separate sinus tract that have not fully closed but there is a harder and feel now, hopefully can continue to granulate from beneath.  I would like to see him back in 4 weeks for follow-up.  Does not need to pack into wounds now, can apply silver alginate to wound base and then gauze dressings changed every other day or every 3 days.  May shower.  Do not soak foot.  Return in about 4 weeks (around 06/10/2023) for wound care.

## 2023-05-20 ENCOUNTER — Other Ambulatory Visit: Payer: Self-pay

## 2023-05-20 ENCOUNTER — Encounter: Payer: Self-pay | Admitting: Internal Medicine

## 2023-05-20 ENCOUNTER — Ambulatory Visit (INDEPENDENT_AMBULATORY_CARE_PROVIDER_SITE_OTHER): Payer: Medicare Other | Admitting: Internal Medicine

## 2023-05-20 VITALS — BP 91/57 | HR 101 | Temp 98.2°F | Resp 16

## 2023-05-20 DIAGNOSIS — M869 Osteomyelitis, unspecified: Secondary | ICD-10-CM

## 2023-05-20 NOTE — Assessment & Plan Note (Signed)
Wounds healing well now after 3 doses of dalbavancin.  At this point, he has reached maximum benefit with antibiotic treatment so further doses not indicated.  He will continue with wound care and  follow up with podiatry.  He otherwise can follow up here as needed.   I have personally spent 30 minutes involved in face-to-face and non-face-to-face activities for this patient on the day of the visit. Professional time spent includes the following activities: Preparing to see the patient (review of tests), Obtaining and/or reviewing separately obtained history (admission/discharge record), Performing a medically appropriate examination and/or evaluation , Ordering medications/tests/procedures, referring and communicating with other health care professionals, Documenting clinical information in the EMR, Independently interpreting results (not separately reported), Communicating results to the patient/family/caregiver, Counseling and educating the patient/family/caregiver and Care coordination (not separately reported).

## 2023-05-20 NOTE — Progress Notes (Signed)
   Subjective:    Patient ID: Duane Hall, male    DOB: 02-14-1956, 67 y.o.   MRN: [id number]  HPI Duane Hall is here for follow up of a wound infection and osteomyelitis. He is followed by Dr. Lilian Kapur of podiatry and grew Group B Strep on a bone biopsy culture and initially treated with amoxicillin with little improvement.  I then had him do dalbavancin and had much better effect with improved healing.  No issues with rash or diarrhea on it.      Review of Systems  Constitutional:  Negative for fatigue and fever.  Gastrointestinal:  Negative for diarrhea.  Skin:  Negative for rash.       Objective:   Physical Exam Eyes:     General: No scleral icterus. Pulmonary:     Effort: Pulmonary effort is normal.  Skin:    Findings: No rash.  Neurological:     Mental Status: He is alert.   SH: no tobacco        Assessment & Plan:

## 2023-05-28 ENCOUNTER — Ambulatory Visit (HOSPITAL_COMMUNITY)
Admission: RE | Admit: 2023-05-28 | Discharge: 2023-05-28 | Disposition: A | Discharge status: Home / Self Care | Payer: Medicare Other | Source: Ambulatory Visit | Attending: Vascular Surgery | Admitting: Vascular Surgery

## 2023-05-28 ENCOUNTER — Ambulatory Visit (INDEPENDENT_AMBULATORY_CARE_PROVIDER_SITE_OTHER)
Admission: RE | Admit: 2023-05-28 | Discharge: 2023-05-28 | Disposition: A | Discharge status: Home / Self Care | Payer: Medicare Other | Source: Ambulatory Visit | Attending: Vascular Surgery | Admitting: Vascular Surgery

## 2023-05-28 ENCOUNTER — Encounter: Payer: Self-pay | Admitting: Podiatry

## 2023-05-28 ENCOUNTER — Ambulatory Visit (INDEPENDENT_AMBULATORY_CARE_PROVIDER_SITE_OTHER): Payer: Medicare Other | Admitting: Physician Assistant

## 2023-05-28 ENCOUNTER — Encounter: Payer: Self-pay | Admitting: Physician Assistant

## 2023-05-28 VITALS — BP 133/74 | HR 85 | Temp 97.7°F | Wt 189.0 lb

## 2023-05-28 DIAGNOSIS — F17200 Nicotine dependence, unspecified, uncomplicated: Secondary | ICD-10-CM

## 2023-05-28 DIAGNOSIS — I70222 Atherosclerosis of native arteries of extremities with rest pain, left leg: Secondary | ICD-10-CM | POA: Diagnosis present

## 2023-05-28 DIAGNOSIS — I739 Peripheral vascular disease, unspecified: Secondary | ICD-10-CM | POA: Insufficient documentation

## 2023-05-28 LAB — VAS US ABI WITH/WO TBI: Left ABI: 0.92

## 2023-05-28 NOTE — Progress Notes (Signed)
HISTORY AND PHYSICAL     CC:  follow up. Requesting Provider:  No ref. provider found  HPI: This is a 67 y.o. male who is here today for follow up for PAD.  Pt has hx of angiogram with laser atherectomy, drug coated balloon angioplasty of left SFA on 06/19/2020 by Dr. Randie Heinz. He had angiogram with  Laser arthrectomy of right SFA and right posterior tibial arteries, balloon angioplasty right posterior tibial artery with 3 mm balloon and drug-coated balloon angioplasty right SFA with 5 mm Ranger12/27/2021 by Dr. Randie Heinz. He underwent right BKA on 03/13/2021 by Dr. Randie Heinz.  He also underwent angiogram with stent of left popliteal artery, stent of left SFA, and plain balloon angioplasty left tibioperoneal trunk on 10/21/2022 by Dr. Randie Heinz for CLI with toe ulceration.  He underwent left great toe amputation on 11/08/2022 by Dr. Lilian Kapur.  He subsequently underwent amputation of the metatarsal phalangeal joint of the left 1st toe on 01/09/2023 by Dr. Lilian Kapur.   Pt was seen by Dr. Lilian Kapur a couple of weeks ago and his wound was healing but still have 2 separate sinus tracts that were not fully closed.  He was hopeful these would continue to close.  He has f/u with him in a couple of weeks.   Pt also has ESRD with hx of right BVT 09/14/2019 by Dr. Edilia Bo.   Pt was last seen 11/20/2022 and at that time, pt's ABI had improved and stents were patent.  There was a stenosis in the proximal SFA with velocity of 307 cm/s. His wound was healing.  He was scheduled for 6 month follow up.   The pt returns today for follow up.  He states that the toe amputation site is improving.  He did not want to remove his sock or the bandage.  He denies any rest pain.  He continues to smoke.  He would like an Rx for evaluation of his current prosthesis.  He states he is on 4 medications and unsure what they are.  He continues to smoke.    The pt is on a statin for cholesterol management.    The pt is on an aspirin.    Other AC:   Plavix The pt is not on medication for hypertension.  The pt is  on medication for diabetes. Tobacco hx:  current  Pt does not have family hx of AAA.  Past Medical History:  Diagnosis Date   Asthma    Cancer (HCC)    Cataract    CKD (chronic kidney disease), stage III (HCC)    Dialysis [facility] at [facility]   Diabetes mellitus    Glaucoma    Hepatitis    Hep C   Hypertension    no longer   Osteomyelitis of great toe of left foot [facility])    Vascular insufficiency 05/2020    Past Surgical History:  Procedure Laterality Date   ABDOMINAL AORTOGRAM W/LOWER EXTREMITY N/A 06/19/2020   Procedure: ABDOMINAL AORTOGRAM W/LOWER EXTREMITY;  Surgeon: Maeola Harman, MD;  Location: [facility] INVASIVE CV LAB;  Service: Cardiovascular;  Laterality: N/A;   ABDOMINAL AORTOGRAM W/LOWER EXTREMITY Left 10/16/2020   Procedure: ABDOMINAL AORTOGRAM W/LOWER EXTREMITY;  Surgeon: Maeola Harman, MD;  Location: [facility] INVASIVE CV LAB;  Service: Cardiovascular;  Laterality: Left;   ABDOMINAL AORTOGRAM W/LOWER EXTREMITY N/A 12/25/2020   Procedure: ABDOMINAL AORTOGRAM W/LOWER EXTREMITY;  Surgeon: Maeola Harman, MD;  Location: [facility] INVASIVE CV LAB;  Service: Cardiovascular;  Laterality: N/A;   ABDOMINAL AORTOGRAM  W/LOWER EXTREMITY N/A 10/21/2022   Procedure: ABDOMINAL AORTOGRAM W/LOWER EXTREMITY;  Surgeon: Maeola Harman, MD;  Location: [facility] INVASIVE CV LAB;  Service: Cardiovascular;  Laterality: N/A;   AMPUTATION Right 01/12/2021   Procedure: 1ST AMPUTATION RAY;  Surgeon: Edwin Cap, DPM;  Location: [facility] OR;  Service: Podiatry;  Laterality: Right;   AMPUTATION Right 03/13/2021   Procedure: RIGHT BELOW KNEE AMPUTATION;  Surgeon: Maeola Harman, MD;  Location: [facility] OR;  Service: Vascular;  Laterality: Right;   AMPUTATION TOE Left 11/08/2022   Procedure: AMPUTATION TOE;  Surgeon: Edwin Cap, DPM;  Location: [facility] ORS;  Service: Podiatry;  Laterality: Left;  DR  MCDONALD WILL BLOCK, ON STRETCHER   AMPUTATION TOE Left 01/09/2023   Procedure: AMPUTATION OF REMAINING LEFT GREAT TOE AND BONE BEHIND TOE;  Surgeon: Edwin Cap, DPM;  Location: [facility] OR;  Service: Podiatry;  Laterality: Left;   AV FISTULA PLACEMENT Right 09/14/2019   Procedure: Right Arm Basilic Vein transposition;  Surgeon: Chuck Hint, MD;  Location: [facility] OR;  Service: Vascular;  Laterality: Right;   BONE BIOPSY Left 07/29/2020   Procedure: BONE BIOPSY;  Surgeon: Edwin Cap, DPM;  Location: [facility] OR;  Service: Podiatry;  Laterality: Left;  Need bone trephines and/or large bore Giamshidi   COLONOSCOPY     COLONOSCOPY WITH PROPOFOL N/A 12/07/2021   Procedure: COLONOSCOPY WITH PROPOFOL;  Surgeon: Jeani Hawking, MD;  Location: [facility] ENDOSCOPY;  Service: Endoscopy;  Laterality: N/A;   PERIPHERAL VASCULAR ATHERECTOMY Left 06/19/2020   Procedure: PERIPHERAL VASCULAR ATHERECTOMY;  Surgeon: Maeola Harman, MD;  Location: [facility] INVASIVE CV LAB;  Service: Cardiovascular;  Laterality: Left;  SFA   PERIPHERAL VASCULAR ATHERECTOMY  12/25/2020   Procedure: PERIPHERAL VASCULAR ATHERECTOMY;  Surgeon: Maeola Harman, MD;  Location: [facility] INVASIVE CV LAB;  Service: Cardiovascular;;  Lt. PT - Laser Lt. SFA - Laser   PERIPHERAL VASCULAR BALLOON ANGIOPLASTY  12/25/2020   Procedure: PERIPHERAL VASCULAR BALLOON ANGIOPLASTY;  Surgeon: Maeola Harman, MD;  Location: [facility] INVASIVE CV LAB;  Service: Cardiovascular;;  Lt. SFA and PT   PERIPHERAL VASCULAR BALLOON ANGIOPLASTY Left 10/21/2022   Procedure: PERIPHERAL VASCULAR BALLOON ANGIOPLASTY;  Surgeon: Maeola Harman, MD;  Location: [facility] INVASIVE CV LAB;  Service: Cardiovascular;  Laterality: Left;   PERIPHERAL VASCULAR INTERVENTION Left 10/21/2022   Procedure: PERIPHERAL VASCULAR INTERVENTION;  Surgeon: Maeola Harman, MD;  Location: [facility] INVASIVE CV LAB;  Service: Cardiovascular;  Laterality: Left;   POLYPECTOMY   12/07/2021   Procedure: POLYPECTOMY;  Surgeon: Jeani Hawking, MD;  Location: [facility] ENDOSCOPY;  Service: Endoscopy;;   UPPER GASTROINTESTINAL ENDOSCOPY  02/2019   Dr Elnoria Howard     WISDOM TOOTH EXTRACTION      Allergies  Allergen Reactions   Amoxicillin-Pot Clavulanate Diarrhea   Midodrine Other (See Comments)    Other reaction(s): Urinary Sensation   Tape Other (See Comments)    Latex band aids cause blistering    Current Outpatient Medications  Medication Sig Dispense Refill   aspirin EC 81 MG EC tablet Take 1 tablet (81 mg total) by mouth daily. Swallow whole. 30 tablet 11   clopidogrel (PLAVIX) 75 MG tablet Take 1 tablet (75 mg total) by mouth daily with breakfast. 30 tablet 3   Continuous Glucose Sensor (DEXCOM G6 SENSOR) MISC 1 Device by Does not apply route See admin instructions. Change every 10 days 9 each 3   Continuous Glucose Transmitter (DEXCOM G6 TRANSMITTER) MISC Change every 3 mos 1 each 3  gentamicin ointment (GARAMYCIN) 0.1 % Apply 1 Application topically daily. 15 g 0   Insulin Disposable Pump (OMNIPOD 5 G6 PODS, GEN 5,) MISC 1 Device by Does not apply route every 3 (three) days. 30 each 3   insulin lispro (HUMALOG) 100 UNIT/ML injection For use in pump, total of 50 units per day 50 mL 3   iron sucrose in sodium chloride 0.9 % 100 mL Inject into the vein daily as needed (low iron).     Methoxy PEG-Epoetin Beta (MIRCERA IJ) Inject into the skin. Once a month if needed     pantoprazole (PROTONIX) 40 MG tablet Take 1 tablet (40 mg total) by mouth daily.     rosuvastatin (CRESTOR) 10 MG tablet TAKE 1 TABLET BY MOUTH DAILY 30 tablet 11   No current facility-administered medications for this visit.    Family History  Problem Relation Age of Onset   Cancer Father    Diabetes Mother     Social History   Socioeconomic History   Marital status: Married    Spouse name: Not on file   Number of children: Not on file   Years of education: Not on file   Highest education  level: Not on file  Occupational History   Occupation: Art gallery manager  Tobacco Use   Smoking status: Every Day    Packs/day: .3    Types: Cigarettes   Smokeless tobacco: Current    Types: Chew   Tobacco comments:    Occasional E-cig   Vaping Use   Vaping Use: Some days   Substances: Nicotine  Substance and Sexual Activity   Alcohol use: No    Alcohol/week: 0.0 standard drinks of alcohol   Drug use: No   Sexual activity: Not Currently  Other Topics Concern   Not on file  Social History Narrative   Married.    Social Determinants of Health   Financial Resource Strain: Not on file  Food Insecurity: Not on file  Transportation Needs: Not on file  Physical Activity: Not on file  Stress: Not on file  Social Connections: Not on file  Intimate Partner Violence: Not on file     REVIEW OF SYSTEMS:   [X]  denotes positive finding, [ ]  denotes negative finding Cardiac  Comments:  Chest pain or chest pressure:    Shortness of breath upon exertion:    Short of breath when lying flat:    Irregular heart rhythm:        Vascular    Pain in calf, thigh, or hip brought on by ambulation:    Pain in feet at night that wakes you up from your sleep:     Blood clot in your veins:    Leg swelling:         Pulmonary    Oxygen at home:    Productive cough:     Wheezing:         Neurologic    Sudden weakness in arms or legs:     Sudden numbness in arms or legs:     Sudden onset of difficulty speaking or slurred speech:    Temporary loss of vision in one eye:     Problems with dizziness:         Gastrointestinal    Blood in stool:     Vomited blood:         Genitourinary    Burning when urinating:     Blood in urine:        Psychiatric  Major depression:         Hematologic    Bleeding problems:    Problems with blood clotting too easily:        Skin    Rashes or ulcers:        Constitutional    Fever or chills:      PHYSICAL EXAMINATION:  Today's Vitals    05/28/23 1414  BP: 133/74  Pulse: 85  Temp: 97.7 F (36.5 C)  TempSrc: Temporal  SpO2: 95%  Weight: 189 lb (85.7 kg)   Body mass index is 27.12 kg/m.   General:  WDWN in NAD; vital signs documented above Gait: Not observed HENT: WNL, normocephalic Pulmonary: normal non-labored breathing , without wheezing Cardiac: regular HR, without carotid bruits Abdomen: soft, NT; aortic pulse is not palpable Skin: without rashes Vascular Exam/Pulses: Right BKA with prosthesis Extremities: sock not removed per pt request.  Doppler flow at ankle level brisk DP/PT Musculoskeletal: no muscle wasting or atrophy  Neurologic: A&O X 3 Psychiatric:  The pt has Normal affect.   Non-Invasive Vascular Imaging:   ABI's/TBI's on 05/28/2023: Right: BKA Left:   0.92/amp - Great toe pressure: amp  Arterial duplex on 05/28/2023: Left Stent(s): SFA  +---------------+---+---------------+----------+---------------+  Prox to Stent  26950-99% stenosisbiphasic  dampened, broad  +---------------+---+---------------+----------+---------------+  Proximal Stent 174               monophasic                 +---------------+---+---------------+----------+---------------+  Mid Stent      136               monophasicbrisk            +---------------+---+---------------+----------+---------------+  Distal Stent   63                monophasic                 +---------------+---+---------------+----------+---------------+  Distal to Stent98                monophasic                 +---------------+---+---------------+----------+---------------+            +----------+--------+-----+---------------+----------+--------+  LEFT     PSV cm/sRatioStenosis       Waveform  Comments  +----------+--------+-----+---------------+----------+--------+  CFA Prox  160          30-49% stenosisbiphasic            +----------+--------+-----+---------------+----------+--------+   CFA Distal196          30-49% stenosisbiphasic            +----------+--------+-----+---------------+----------+--------+  DFA      228          50-74% stenosis                    +----------+--------+-----+---------------+----------+--------+  SFA Prox  232          50-74% stenosismonophasicbrisk     +----------+--------+-----+---------------+----------+--------+  SFA Mid   206                         biphasic            +----------+--------+-----+---------------+----------+--------+     Left Stent(s): Popliteal stent  +---------------+--------+--------+----------+--------+  popliteal stentPSV cm/sStenosisWaveform  Comments  +---------------+--------+--------+----------+--------+  Prox to Stent  81              biphasic  dampened  +---------------+--------+--------+----------+--------+  Proximal Stent 81              monophasic          +---------------+--------+--------+----------+--------+  Mid Stent      73              monophasic          +---------------+--------+--------+----------+--------+  Distal Stent   114             monophasic          +---------------+--------+--------+----------+--------+  Distal to Stent109             monophasic          +---------------+--------+--------+----------+--------+   Summary:  30-49% stenosis noted in the common femoral artery. 50-74% stenosis noted in the superficial femoral artery.   The left SFA stent is difficult to insonate (unable to appreciate stent walls), but appears patent with a 50 - 99% stenosis at what appears to be proximal to stent.   Patent left stent with no visualized stenosis.    Previous ABI's/TBI's on 11/20/2022: Right:  BKA Left:  1.10/amp - Great toe pressure: amp  Previous arterial duplex on 11/20/2022: Left SFA Stent(s):  +---------------+--------+--------+--------+--------+  SFA stent      PSV cm/sStenosisWaveformComments   +---------------+--------+--------+--------+--------+  Prox to Stent  145             biphasic          +---------------+--------+--------+--------+--------+  Proximal Stent 204             biphasic          +---------------+--------+--------+--------+--------+  Mid Stent      119             biphasic          +---------------+--------+--------+--------+--------+  Distal Stent   140             biphasic          +---------------+--------+--------+--------+--------+  Distal to Stent121             biphasic          +---------------+--------+--------+--------+--------+   +--------+--------+-----+---------------+--------+--------+  LEFT   PSV cm/sRatioStenosis       WaveformComments  +--------+--------+-----+---------------+--------+--------+  CFA Prox126                         biphasic          +--------+--------+-----+---------------+--------+--------+  DFA    229                         biphasic          +--------+--------+-----+---------------+--------+--------+  SFA Prox307          50-74% stenosisbiphasic          +--------+--------+-----+---------------+--------+--------+  SFA Mid 192                         biphasic          +--------+--------+-----+---------------+--------+--------+  POP Mid                                     stent     +--------+--------+-----+---------------+--------+--------+     ASSESSMENT/PLAN:: 67 y.o. male here for follow up for PAD with hx of angiogram with laser atherectomy, drug coated balloon angioplasty of left SFA on 06/19/2020 by Dr.  Randie Heinz. He had angiogram with  Laser arthrectomy of right SFA and right posterior tibial arteries, balloon angioplasty right posterior tibial artery with 3 mm balloon and drug-coated balloon angioplasty right SFA with 5 mm Ranger12/27/2021 by Dr. Randie Heinz. He underwent right BKA on 03/13/2021 by Dr. Randie Heinz.  He also underwent angiogram with stent of left  popliteal artery, stent of left SFA, and plain balloon angioplasty left tibioperoneal trunk on 10/21/2022 by Dr. Randie Heinz for CLI with toe ulceration.  He underwent left great toe amputation on 11/08/2022 by Dr. Lilian Kapur.  He subsequently underwent amputation of the metatarsal phalangeal joint of the left 1st toe on 01/09/2023 by Dr. Lilian Kapur.   PAD -pt with brisk monophasic doppler left foot.   -subjectively, toe amputation site improving.  Per pt request, sock was not removed.   -discussed with pt he does have stenosis in the left leg but since his toe amputation site is healing, will plan to see him back in 6 months and re-evaluate.  Pt does not see value in returning for duplex.  Explained to him that if we can catch the issue early, it is much easier.   -he was given a rx for evaluation of his current prosthesis and replacement as needed.  -continue asa/plavix/statin-appears he is taking these but he is not sure.  Encouraged him to continue these medications from vascular standpoint.   Smoker: -discussed importance of smoking cessation.  Pt has no interest in quitting.     Doreatha Massed, [facility] [phone]  Clinic MD:   Randie Heinz

## 2023-06-03 ENCOUNTER — Other Ambulatory Visit: Payer: Self-pay

## 2023-06-03 DIAGNOSIS — I70222 Atherosclerosis of native arteries of extremities with rest pain, left leg: Secondary | ICD-10-CM

## 2023-06-03 DIAGNOSIS — I739 Peripheral vascular disease, unspecified: Secondary | ICD-10-CM

## 2023-06-10 ENCOUNTER — Encounter: Payer: Self-pay | Admitting: Podiatry

## 2023-06-10 ENCOUNTER — Ambulatory Visit (INDEPENDENT_AMBULATORY_CARE_PROVIDER_SITE_OTHER): Payer: Medicare Other | Admitting: Podiatry

## 2023-06-10 DIAGNOSIS — L97524 Non-pressure chronic ulcer of other part of left foot with necrosis of bone: Secondary | ICD-10-CM | POA: Diagnosis not present

## 2023-06-10 DIAGNOSIS — M86472 Chronic osteomyelitis with draining sinus, left ankle and foot: Secondary | ICD-10-CM

## 2023-06-11 NOTE — Progress Notes (Signed)
  Subjective:  Patient ID: Duane Hall, male    DOB: 03/28/1956,  MRN: [id number]  Chief Complaint  Patient presents with   Foot Swelling    Wound care pt also is having a lot of swelling       67 y.o. male returns for post-op check. Continues to scab over  Review of Systems: Negative except as noted in the HPI. Denies N/V/F/Ch.   Objective:  There were no vitals filed for this visit. There is no height or weight on file to calculate BMI. Constitutional Well developed. Well nourished.  Vascular Foot warm and well perfused. Capillary refill normal to all digits.  Calf is soft and supple, no posterior calf or knee pain, negative Homans' sign  Neurologic Normal speech. Oriented to person, place, and time. Epicritic sensation to light touch grossly absent bilaterally.  Dermatologic No longer is cellulitic.  Persistent ulcerations noted with fibrogranular tissue, no active drainage. Medial sinus tract is 4mm deep, lateral is 10mm.Marland Kitchen erythema has resolved, still edema present  Orthopedic: He has no tenderness to palpation noted about the surgical site.   , On the medial side there is a small scab measuring      Left first metatarsal path 02/28/23: Benign granulation tissue and soft tissue with  changes consistent with a healing wound.   - Fragments of benign bone with degenerative  changes and chronic inflammation in marrow  spaces, consistent with chronic osteomyelitis.   Left first metatarsal bone culture: Streptococcus agalactiae Abnormal    Comment: Light growth of Group B Streptococcus isolated Negative for inducible clindamycin resistance.  Resulting Agency [facility] [place]     Susceptibility   Streptococcus agalactiae    AEROBIC BACTERIA, CULT POSITIVE 1    AMPICILLIN <=0.25 Sensitive    CEFOTAXIME <=0.12 Sensitive    CEFTRIAXONE <=0.12 Sensitive    CLINDAMYCIN <=0.25 Sensitive    ERYTHROMYCIN 4 Resistant 1    LEVOFLOXACIN 1 Sensitive    PENO -  penicillin <=0.06 Sensitive 2    VANCOMYCIN 0.5 Sensitive           Assessment:   1. Ulcer of left foot with necrosis of bone (HCC)   2. Chronic osteomyelitis of left foot with draining sinus (HCC)    Plan:  Patient was evaluated and treated and all questions answered.  S/p foot surgery left -Continues to slowly improve. He does have chronic edema. We discussed likely there is chronic latent osteomyelitis but as long as the tracts gradually close and there is no acute flare then I would recommend no further management and we monitor his clinical progress. May be WB in regular shoes, normal activities, and can bathe/shower as desired  Return in about 6 weeks (around 07/22/2023).

## 2023-06-20 ENCOUNTER — Encounter: Payer: Self-pay | Admitting: Internal Medicine

## 2023-06-30 DIAGNOSIS — R252 Cramp and spasm: Secondary | ICD-10-CM | POA: Diagnosis not present

## 2023-06-30 DIAGNOSIS — D631 Anemia in chronic kidney disease: Secondary | ICD-10-CM | POA: Diagnosis not present

## 2023-06-30 DIAGNOSIS — Z992 Dependence on renal dialysis: Secondary | ICD-10-CM | POA: Diagnosis not present

## 2023-06-30 DIAGNOSIS — I953 Hypotension of hemodialysis: Secondary | ICD-10-CM | POA: Diagnosis not present

## 2023-06-30 DIAGNOSIS — N186 End stage renal disease: Secondary | ICD-10-CM | POA: Diagnosis not present

## 2023-06-30 DIAGNOSIS — N2581 Secondary hyperparathyroidism of renal origin: Secondary | ICD-10-CM | POA: Diagnosis not present

## 2023-07-02 DIAGNOSIS — Z992 Dependence on renal dialysis: Secondary | ICD-10-CM | POA: Diagnosis not present

## 2023-07-02 DIAGNOSIS — N186 End stage renal disease: Secondary | ICD-10-CM | POA: Diagnosis not present

## 2023-07-02 DIAGNOSIS — N2581 Secondary hyperparathyroidism of renal origin: Secondary | ICD-10-CM | POA: Diagnosis not present

## 2023-07-02 DIAGNOSIS — I953 Hypotension of hemodialysis: Secondary | ICD-10-CM | POA: Diagnosis not present

## 2023-07-02 DIAGNOSIS — R252 Cramp and spasm: Secondary | ICD-10-CM | POA: Diagnosis not present

## 2023-07-02 DIAGNOSIS — D631 Anemia in chronic kidney disease: Secondary | ICD-10-CM | POA: Diagnosis not present

## 2023-07-03 DIAGNOSIS — N186 End stage renal disease: Secondary | ICD-10-CM | POA: Diagnosis not present

## 2023-07-03 DIAGNOSIS — I953 Hypotension of hemodialysis: Secondary | ICD-10-CM | POA: Diagnosis not present

## 2023-07-03 DIAGNOSIS — R252 Cramp and spasm: Secondary | ICD-10-CM | POA: Diagnosis not present

## 2023-07-03 DIAGNOSIS — D631 Anemia in chronic kidney disease: Secondary | ICD-10-CM | POA: Diagnosis not present

## 2023-07-03 DIAGNOSIS — N2581 Secondary hyperparathyroidism of renal origin: Secondary | ICD-10-CM | POA: Diagnosis not present

## 2023-07-03 DIAGNOSIS — Z992 Dependence on renal dialysis: Secondary | ICD-10-CM | POA: Diagnosis not present

## 2023-07-06 DIAGNOSIS — D631 Anemia in chronic kidney disease: Secondary | ICD-10-CM | POA: Diagnosis not present

## 2023-07-06 DIAGNOSIS — R252 Cramp and spasm: Secondary | ICD-10-CM | POA: Diagnosis not present

## 2023-07-06 DIAGNOSIS — Z992 Dependence on renal dialysis: Secondary | ICD-10-CM | POA: Diagnosis not present

## 2023-07-06 DIAGNOSIS — I953 Hypotension of hemodialysis: Secondary | ICD-10-CM | POA: Diagnosis not present

## 2023-07-06 DIAGNOSIS — N186 End stage renal disease: Secondary | ICD-10-CM | POA: Diagnosis not present

## 2023-07-06 DIAGNOSIS — N2581 Secondary hyperparathyroidism of renal origin: Secondary | ICD-10-CM | POA: Diagnosis not present

## 2023-07-07 DIAGNOSIS — N186 End stage renal disease: Secondary | ICD-10-CM | POA: Diagnosis not present

## 2023-07-07 DIAGNOSIS — I953 Hypotension of hemodialysis: Secondary | ICD-10-CM | POA: Diagnosis not present

## 2023-07-07 DIAGNOSIS — Z992 Dependence on renal dialysis: Secondary | ICD-10-CM | POA: Diagnosis not present

## 2023-07-07 DIAGNOSIS — N2581 Secondary hyperparathyroidism of renal origin: Secondary | ICD-10-CM | POA: Diagnosis not present

## 2023-07-07 DIAGNOSIS — R252 Cramp and spasm: Secondary | ICD-10-CM | POA: Diagnosis not present

## 2023-07-07 DIAGNOSIS — D631 Anemia in chronic kidney disease: Secondary | ICD-10-CM | POA: Diagnosis not present

## 2023-07-09 DIAGNOSIS — I953 Hypotension of hemodialysis: Secondary | ICD-10-CM | POA: Diagnosis not present

## 2023-07-09 DIAGNOSIS — R252 Cramp and spasm: Secondary | ICD-10-CM | POA: Diagnosis not present

## 2023-07-09 DIAGNOSIS — D631 Anemia in chronic kidney disease: Secondary | ICD-10-CM | POA: Diagnosis not present

## 2023-07-09 DIAGNOSIS — N2581 Secondary hyperparathyroidism of renal origin: Secondary | ICD-10-CM | POA: Diagnosis not present

## 2023-07-09 DIAGNOSIS — Z992 Dependence on renal dialysis: Secondary | ICD-10-CM | POA: Diagnosis not present

## 2023-07-09 DIAGNOSIS — N186 End stage renal disease: Secondary | ICD-10-CM | POA: Diagnosis not present

## 2023-07-10 DIAGNOSIS — Z992 Dependence on renal dialysis: Secondary | ICD-10-CM | POA: Diagnosis not present

## 2023-07-10 DIAGNOSIS — R252 Cramp and spasm: Secondary | ICD-10-CM | POA: Diagnosis not present

## 2023-07-10 DIAGNOSIS — D631 Anemia in chronic kidney disease: Secondary | ICD-10-CM | POA: Diagnosis not present

## 2023-07-10 DIAGNOSIS — N2581 Secondary hyperparathyroidism of renal origin: Secondary | ICD-10-CM | POA: Diagnosis not present

## 2023-07-10 DIAGNOSIS — N186 End stage renal disease: Secondary | ICD-10-CM | POA: Diagnosis not present

## 2023-07-10 DIAGNOSIS — I953 Hypotension of hemodialysis: Secondary | ICD-10-CM | POA: Diagnosis not present

## 2023-07-13 DIAGNOSIS — R252 Cramp and spasm: Secondary | ICD-10-CM | POA: Diagnosis not present

## 2023-07-13 DIAGNOSIS — I953 Hypotension of hemodialysis: Secondary | ICD-10-CM | POA: Diagnosis not present

## 2023-07-13 DIAGNOSIS — D631 Anemia in chronic kidney disease: Secondary | ICD-10-CM | POA: Diagnosis not present

## 2023-07-13 DIAGNOSIS — N186 End stage renal disease: Secondary | ICD-10-CM | POA: Diagnosis not present

## 2023-07-13 DIAGNOSIS — N2581 Secondary hyperparathyroidism of renal origin: Secondary | ICD-10-CM | POA: Diagnosis not present

## 2023-07-13 DIAGNOSIS — Z992 Dependence on renal dialysis: Secondary | ICD-10-CM | POA: Diagnosis not present

## 2023-07-14 DIAGNOSIS — D631 Anemia in chronic kidney disease: Secondary | ICD-10-CM | POA: Diagnosis not present

## 2023-07-14 DIAGNOSIS — N2581 Secondary hyperparathyroidism of renal origin: Secondary | ICD-10-CM | POA: Diagnosis not present

## 2023-07-14 DIAGNOSIS — N186 End stage renal disease: Secondary | ICD-10-CM | POA: Diagnosis not present

## 2023-07-14 DIAGNOSIS — I953 Hypotension of hemodialysis: Secondary | ICD-10-CM | POA: Diagnosis not present

## 2023-07-14 DIAGNOSIS — R252 Cramp and spasm: Secondary | ICD-10-CM | POA: Diagnosis not present

## 2023-07-14 DIAGNOSIS — Z992 Dependence on renal dialysis: Secondary | ICD-10-CM | POA: Diagnosis not present

## 2023-07-16 DIAGNOSIS — R252 Cramp and spasm: Secondary | ICD-10-CM | POA: Diagnosis not present

## 2023-07-16 DIAGNOSIS — N186 End stage renal disease: Secondary | ICD-10-CM | POA: Diagnosis not present

## 2023-07-16 DIAGNOSIS — D631 Anemia in chronic kidney disease: Secondary | ICD-10-CM | POA: Diagnosis not present

## 2023-07-16 DIAGNOSIS — Z992 Dependence on renal dialysis: Secondary | ICD-10-CM | POA: Diagnosis not present

## 2023-07-16 DIAGNOSIS — N2581 Secondary hyperparathyroidism of renal origin: Secondary | ICD-10-CM | POA: Diagnosis not present

## 2023-07-16 DIAGNOSIS — I953 Hypotension of hemodialysis: Secondary | ICD-10-CM | POA: Diagnosis not present

## 2023-07-17 DIAGNOSIS — Z992 Dependence on renal dialysis: Secondary | ICD-10-CM | POA: Diagnosis not present

## 2023-07-17 DIAGNOSIS — I953 Hypotension of hemodialysis: Secondary | ICD-10-CM | POA: Diagnosis not present

## 2023-07-17 DIAGNOSIS — D631 Anemia in chronic kidney disease: Secondary | ICD-10-CM | POA: Diagnosis not present

## 2023-07-17 DIAGNOSIS — N2581 Secondary hyperparathyroidism of renal origin: Secondary | ICD-10-CM | POA: Diagnosis not present

## 2023-07-17 DIAGNOSIS — N186 End stage renal disease: Secondary | ICD-10-CM | POA: Diagnosis not present

## 2023-07-17 DIAGNOSIS — R252 Cramp and spasm: Secondary | ICD-10-CM | POA: Diagnosis not present

## 2023-07-20 DIAGNOSIS — I953 Hypotension of hemodialysis: Secondary | ICD-10-CM | POA: Diagnosis not present

## 2023-07-20 DIAGNOSIS — Z992 Dependence on renal dialysis: Secondary | ICD-10-CM | POA: Diagnosis not present

## 2023-07-20 DIAGNOSIS — N2581 Secondary hyperparathyroidism of renal origin: Secondary | ICD-10-CM | POA: Diagnosis not present

## 2023-07-20 DIAGNOSIS — N186 End stage renal disease: Secondary | ICD-10-CM | POA: Diagnosis not present

## 2023-07-20 DIAGNOSIS — R252 Cramp and spasm: Secondary | ICD-10-CM | POA: Diagnosis not present

## 2023-07-20 DIAGNOSIS — D631 Anemia in chronic kidney disease: Secondary | ICD-10-CM | POA: Diagnosis not present

## 2023-07-21 DIAGNOSIS — R252 Cramp and spasm: Secondary | ICD-10-CM | POA: Diagnosis not present

## 2023-07-21 DIAGNOSIS — D631 Anemia in chronic kidney disease: Secondary | ICD-10-CM | POA: Diagnosis not present

## 2023-07-21 DIAGNOSIS — Z992 Dependence on renal dialysis: Secondary | ICD-10-CM | POA: Diagnosis not present

## 2023-07-21 DIAGNOSIS — N186 End stage renal disease: Secondary | ICD-10-CM | POA: Diagnosis not present

## 2023-07-21 DIAGNOSIS — I953 Hypotension of hemodialysis: Secondary | ICD-10-CM | POA: Diagnosis not present

## 2023-07-21 DIAGNOSIS — N2581 Secondary hyperparathyroidism of renal origin: Secondary | ICD-10-CM | POA: Diagnosis not present

## 2023-07-23 DIAGNOSIS — D631 Anemia in chronic kidney disease: Secondary | ICD-10-CM | POA: Diagnosis not present

## 2023-07-23 DIAGNOSIS — N2581 Secondary hyperparathyroidism of renal origin: Secondary | ICD-10-CM | POA: Diagnosis not present

## 2023-07-23 DIAGNOSIS — I953 Hypotension of hemodialysis: Secondary | ICD-10-CM | POA: Diagnosis not present

## 2023-07-23 DIAGNOSIS — N186 End stage renal disease: Secondary | ICD-10-CM | POA: Diagnosis not present

## 2023-07-23 DIAGNOSIS — Z992 Dependence on renal dialysis: Secondary | ICD-10-CM | POA: Diagnosis not present

## 2023-07-23 DIAGNOSIS — R252 Cramp and spasm: Secondary | ICD-10-CM | POA: Diagnosis not present

## 2023-07-24 DIAGNOSIS — Z992 Dependence on renal dialysis: Secondary | ICD-10-CM | POA: Diagnosis not present

## 2023-07-24 DIAGNOSIS — R252 Cramp and spasm: Secondary | ICD-10-CM | POA: Diagnosis not present

## 2023-07-24 DIAGNOSIS — N186 End stage renal disease: Secondary | ICD-10-CM | POA: Diagnosis not present

## 2023-07-24 DIAGNOSIS — N2581 Secondary hyperparathyroidism of renal origin: Secondary | ICD-10-CM | POA: Diagnosis not present

## 2023-07-24 DIAGNOSIS — I953 Hypotension of hemodialysis: Secondary | ICD-10-CM | POA: Diagnosis not present

## 2023-07-24 DIAGNOSIS — D631 Anemia in chronic kidney disease: Secondary | ICD-10-CM | POA: Diagnosis not present

## 2023-07-27 DIAGNOSIS — R252 Cramp and spasm: Secondary | ICD-10-CM | POA: Diagnosis not present

## 2023-07-27 DIAGNOSIS — N186 End stage renal disease: Secondary | ICD-10-CM | POA: Diagnosis not present

## 2023-07-27 DIAGNOSIS — D631 Anemia in chronic kidney disease: Secondary | ICD-10-CM | POA: Diagnosis not present

## 2023-07-27 DIAGNOSIS — N2581 Secondary hyperparathyroidism of renal origin: Secondary | ICD-10-CM | POA: Diagnosis not present

## 2023-07-27 DIAGNOSIS — I953 Hypotension of hemodialysis: Secondary | ICD-10-CM | POA: Diagnosis not present

## 2023-07-27 DIAGNOSIS — Z992 Dependence on renal dialysis: Secondary | ICD-10-CM | POA: Diagnosis not present

## 2023-07-28 DIAGNOSIS — N186 End stage renal disease: Secondary | ICD-10-CM | POA: Diagnosis not present

## 2023-07-28 DIAGNOSIS — Z992 Dependence on renal dialysis: Secondary | ICD-10-CM | POA: Diagnosis not present

## 2023-07-28 DIAGNOSIS — D631 Anemia in chronic kidney disease: Secondary | ICD-10-CM | POA: Diagnosis not present

## 2023-07-28 DIAGNOSIS — I953 Hypotension of hemodialysis: Secondary | ICD-10-CM | POA: Diagnosis not present

## 2023-07-28 DIAGNOSIS — N2581 Secondary hyperparathyroidism of renal origin: Secondary | ICD-10-CM | POA: Diagnosis not present

## 2023-07-28 DIAGNOSIS — R252 Cramp and spasm: Secondary | ICD-10-CM | POA: Diagnosis not present

## 2023-07-29 ENCOUNTER — Ambulatory Visit (INDEPENDENT_AMBULATORY_CARE_PROVIDER_SITE_OTHER): Payer: Medicare Other | Admitting: Podiatry

## 2023-07-29 ENCOUNTER — Encounter: Payer: Self-pay | Admitting: Podiatry

## 2023-07-29 DIAGNOSIS — L97524 Non-pressure chronic ulcer of other part of left foot with necrosis of bone: Secondary | ICD-10-CM | POA: Diagnosis not present

## 2023-07-29 DIAGNOSIS — M86472 Chronic osteomyelitis with draining sinus, left ankle and foot: Secondary | ICD-10-CM

## 2023-07-30 DIAGNOSIS — D631 Anemia in chronic kidney disease: Secondary | ICD-10-CM | POA: Diagnosis not present

## 2023-07-30 DIAGNOSIS — R252 Cramp and spasm: Secondary | ICD-10-CM | POA: Diagnosis not present

## 2023-07-30 DIAGNOSIS — N186 End stage renal disease: Secondary | ICD-10-CM | POA: Diagnosis not present

## 2023-07-30 DIAGNOSIS — N2581 Secondary hyperparathyroidism of renal origin: Secondary | ICD-10-CM | POA: Diagnosis not present

## 2023-07-30 DIAGNOSIS — I953 Hypotension of hemodialysis: Secondary | ICD-10-CM | POA: Diagnosis not present

## 2023-07-30 DIAGNOSIS — Z992 Dependence on renal dialysis: Secondary | ICD-10-CM | POA: Diagnosis not present

## 2023-07-30 NOTE — Progress Notes (Signed)
  Subjective:  Patient ID: Duane Hall, male    DOB: June 13, 1956,  MRN: [id number]  Chief Complaint  Patient presents with   Wound Check    "I don't think it's changed."       67 y.o. male returns for post-op check.  He feels like it is doing fine  Review of Systems: Negative except as noted in the HPI. Denies N/V/F/Ch.   Objective:  There were no vitals filed for this visit. There is no height or weight on file to calculate BMI. Constitutional Well developed. Well nourished.  Vascular Foot warm and well perfused. Capillary refill normal to all digits.  Calf is soft and supple, no posterior calf or knee pain, negative Homans' sign  Neurologic Normal speech. Oriented to person, place, and time. Epicritic sensation to light touch grossly absent bilaterally.  Dermatologic Erythema resolved.  Only a shallow area of scab.  No further drainage or sinus tract  Orthopedic: He has no tenderness to palpation noted about the surgical site.   , On the medial side there is a small scab measuring      Left first metatarsal path 02/28/23: Benign granulation tissue and soft tissue with  changes consistent with a healing wound.   - Fragments of benign bone with degenerative  changes and chronic inflammation in marrow  spaces, consistent with chronic osteomyelitis.   Left first metatarsal bone culture: Streptococcus agalactiae Abnormal    Comment: Light growth of Group B Streptococcus isolated Negative for inducible clindamycin resistance.  Resulting Agency [facility] [place]     Susceptibility   Streptococcus agalactiae    AEROBIC BACTERIA, CULT POSITIVE 1    AMPICILLIN <=0.25 Sensitive    CEFOTAXIME <=0.12 Sensitive    CEFTRIAXONE <=0.12 Sensitive    CLINDAMYCIN <=0.25 Sensitive    ERYTHROMYCIN 4 Resistant 1    LEVOFLOXACIN 1 Sensitive    PENO - penicillin <=0.06 Sensitive 2    VANCOMYCIN 0.5 Sensitive           Assessment:   1. Ulcer of left foot with  necrosis of bone (HCC)   2. Chronic osteomyelitis of left foot with draining sinus (HCC)     Plan:  Patient was ev slight visit aluated and treated and all questions answered.  S/p foot surgery left -Appears to have healed at this point.  He will continue to monitor leave open to air and return to see me as needed if it worsens or returns.  Return if symptoms worsen or fail to improve.

## 2023-07-31 DIAGNOSIS — Z992 Dependence on renal dialysis: Secondary | ICD-10-CM | POA: Diagnosis not present

## 2023-07-31 DIAGNOSIS — N186 End stage renal disease: Secondary | ICD-10-CM | POA: Diagnosis not present

## 2023-07-31 DIAGNOSIS — E1129 Type 2 diabetes mellitus with other diabetic kidney complication: Secondary | ICD-10-CM | POA: Diagnosis not present

## 2023-07-31 DIAGNOSIS — I871 Compression of vein: Secondary | ICD-10-CM | POA: Diagnosis not present

## 2023-07-31 DIAGNOSIS — T82858A Stenosis of vascular prosthetic devices, implants and grafts, initial encounter: Secondary | ICD-10-CM | POA: Diagnosis not present

## 2023-08-05 DIAGNOSIS — Z992 Dependence on renal dialysis: Secondary | ICD-10-CM | POA: Diagnosis not present

## 2023-08-05 DIAGNOSIS — T82858A Stenosis of vascular prosthetic devices, implants and grafts, initial encounter: Secondary | ICD-10-CM | POA: Diagnosis not present

## 2023-08-05 DIAGNOSIS — N186 End stage renal disease: Secondary | ICD-10-CM | POA: Diagnosis not present

## 2023-08-05 DIAGNOSIS — I871 Compression of vein: Secondary | ICD-10-CM | POA: Diagnosis not present

## 2023-08-31 DIAGNOSIS — R252 Cramp and spasm: Secondary | ICD-10-CM | POA: Diagnosis not present

## 2023-08-31 DIAGNOSIS — D631 Anemia in chronic kidney disease: Secondary | ICD-10-CM | POA: Diagnosis not present

## 2023-08-31 DIAGNOSIS — I953 Hypotension of hemodialysis: Secondary | ICD-10-CM | POA: Diagnosis not present

## 2023-08-31 DIAGNOSIS — E1129 Type 2 diabetes mellitus with other diabetic kidney complication: Secondary | ICD-10-CM | POA: Diagnosis not present

## 2023-08-31 DIAGNOSIS — N2581 Secondary hyperparathyroidism of renal origin: Secondary | ICD-10-CM | POA: Diagnosis not present

## 2023-08-31 DIAGNOSIS — Z992 Dependence on renal dialysis: Secondary | ICD-10-CM | POA: Diagnosis not present

## 2023-08-31 DIAGNOSIS — N186 End stage renal disease: Secondary | ICD-10-CM | POA: Diagnosis not present

## 2023-09-01 DIAGNOSIS — R252 Cramp and spasm: Secondary | ICD-10-CM | POA: Diagnosis not present

## 2023-09-01 DIAGNOSIS — I953 Hypotension of hemodialysis: Secondary | ICD-10-CM | POA: Diagnosis not present

## 2023-09-01 DIAGNOSIS — N186 End stage renal disease: Secondary | ICD-10-CM | POA: Diagnosis not present

## 2023-09-01 DIAGNOSIS — Z992 Dependence on renal dialysis: Secondary | ICD-10-CM | POA: Diagnosis not present

## 2023-09-01 DIAGNOSIS — D631 Anemia in chronic kidney disease: Secondary | ICD-10-CM | POA: Diagnosis not present

## 2023-09-01 DIAGNOSIS — N2581 Secondary hyperparathyroidism of renal origin: Secondary | ICD-10-CM | POA: Diagnosis not present

## 2023-09-03 DIAGNOSIS — R252 Cramp and spasm: Secondary | ICD-10-CM | POA: Diagnosis not present

## 2023-09-03 DIAGNOSIS — N186 End stage renal disease: Secondary | ICD-10-CM | POA: Diagnosis not present

## 2023-09-03 DIAGNOSIS — I953 Hypotension of hemodialysis: Secondary | ICD-10-CM | POA: Diagnosis not present

## 2023-09-03 DIAGNOSIS — Z992 Dependence on renal dialysis: Secondary | ICD-10-CM | POA: Diagnosis not present

## 2023-09-03 DIAGNOSIS — D631 Anemia in chronic kidney disease: Secondary | ICD-10-CM | POA: Diagnosis not present

## 2023-09-03 DIAGNOSIS — N2581 Secondary hyperparathyroidism of renal origin: Secondary | ICD-10-CM | POA: Diagnosis not present

## 2023-09-04 DIAGNOSIS — I953 Hypotension of hemodialysis: Secondary | ICD-10-CM | POA: Diagnosis not present

## 2023-09-04 DIAGNOSIS — R252 Cramp and spasm: Secondary | ICD-10-CM | POA: Diagnosis not present

## 2023-09-04 DIAGNOSIS — Z992 Dependence on renal dialysis: Secondary | ICD-10-CM | POA: Diagnosis not present

## 2023-09-04 DIAGNOSIS — D631 Anemia in chronic kidney disease: Secondary | ICD-10-CM | POA: Diagnosis not present

## 2023-09-04 DIAGNOSIS — N2581 Secondary hyperparathyroidism of renal origin: Secondary | ICD-10-CM | POA: Diagnosis not present

## 2023-09-04 DIAGNOSIS — N186 End stage renal disease: Secondary | ICD-10-CM | POA: Diagnosis not present

## 2023-09-07 DIAGNOSIS — Z992 Dependence on renal dialysis: Secondary | ICD-10-CM | POA: Diagnosis not present

## 2023-09-07 DIAGNOSIS — N2581 Secondary hyperparathyroidism of renal origin: Secondary | ICD-10-CM | POA: Diagnosis not present

## 2023-09-07 DIAGNOSIS — D631 Anemia in chronic kidney disease: Secondary | ICD-10-CM | POA: Diagnosis not present

## 2023-09-07 DIAGNOSIS — I953 Hypotension of hemodialysis: Secondary | ICD-10-CM | POA: Diagnosis not present

## 2023-09-07 DIAGNOSIS — R252 Cramp and spasm: Secondary | ICD-10-CM | POA: Diagnosis not present

## 2023-09-07 DIAGNOSIS — N186 End stage renal disease: Secondary | ICD-10-CM | POA: Diagnosis not present

## 2023-09-08 DIAGNOSIS — Z992 Dependence on renal dialysis: Secondary | ICD-10-CM | POA: Diagnosis not present

## 2023-09-08 DIAGNOSIS — I953 Hypotension of hemodialysis: Secondary | ICD-10-CM | POA: Diagnosis not present

## 2023-09-08 DIAGNOSIS — N2581 Secondary hyperparathyroidism of renal origin: Secondary | ICD-10-CM | POA: Diagnosis not present

## 2023-09-08 DIAGNOSIS — N186 End stage renal disease: Secondary | ICD-10-CM | POA: Diagnosis not present

## 2023-09-08 DIAGNOSIS — D631 Anemia in chronic kidney disease: Secondary | ICD-10-CM | POA: Diagnosis not present

## 2023-09-08 DIAGNOSIS — R252 Cramp and spasm: Secondary | ICD-10-CM | POA: Diagnosis not present

## 2023-09-10 DIAGNOSIS — N186 End stage renal disease: Secondary | ICD-10-CM | POA: Diagnosis not present

## 2023-09-10 DIAGNOSIS — N2581 Secondary hyperparathyroidism of renal origin: Secondary | ICD-10-CM | POA: Diagnosis not present

## 2023-09-10 DIAGNOSIS — D631 Anemia in chronic kidney disease: Secondary | ICD-10-CM | POA: Diagnosis not present

## 2023-09-10 DIAGNOSIS — Z992 Dependence on renal dialysis: Secondary | ICD-10-CM | POA: Diagnosis not present

## 2023-09-10 DIAGNOSIS — I953 Hypotension of hemodialysis: Secondary | ICD-10-CM | POA: Diagnosis not present

## 2023-09-10 DIAGNOSIS — R252 Cramp and spasm: Secondary | ICD-10-CM | POA: Diagnosis not present

## 2023-09-11 DIAGNOSIS — N186 End stage renal disease: Secondary | ICD-10-CM | POA: Diagnosis not present

## 2023-09-11 DIAGNOSIS — Z992 Dependence on renal dialysis: Secondary | ICD-10-CM | POA: Diagnosis not present

## 2023-09-11 DIAGNOSIS — I953 Hypotension of hemodialysis: Secondary | ICD-10-CM | POA: Diagnosis not present

## 2023-09-11 DIAGNOSIS — R252 Cramp and spasm: Secondary | ICD-10-CM | POA: Diagnosis not present

## 2023-09-11 DIAGNOSIS — D631 Anemia in chronic kidney disease: Secondary | ICD-10-CM | POA: Diagnosis not present

## 2023-09-11 DIAGNOSIS — N2581 Secondary hyperparathyroidism of renal origin: Secondary | ICD-10-CM | POA: Diagnosis not present

## 2023-09-14 DIAGNOSIS — N2581 Secondary hyperparathyroidism of renal origin: Secondary | ICD-10-CM | POA: Diagnosis not present

## 2023-09-14 DIAGNOSIS — D631 Anemia in chronic kidney disease: Secondary | ICD-10-CM | POA: Diagnosis not present

## 2023-09-14 DIAGNOSIS — I953 Hypotension of hemodialysis: Secondary | ICD-10-CM | POA: Diagnosis not present

## 2023-09-14 DIAGNOSIS — N186 End stage renal disease: Secondary | ICD-10-CM | POA: Diagnosis not present

## 2023-09-14 DIAGNOSIS — R252 Cramp and spasm: Secondary | ICD-10-CM | POA: Diagnosis not present

## 2023-09-14 DIAGNOSIS — Z992 Dependence on renal dialysis: Secondary | ICD-10-CM | POA: Diagnosis not present

## 2023-09-15 DIAGNOSIS — N186 End stage renal disease: Secondary | ICD-10-CM | POA: Diagnosis not present

## 2023-09-15 DIAGNOSIS — N2581 Secondary hyperparathyroidism of renal origin: Secondary | ICD-10-CM | POA: Diagnosis not present

## 2023-09-15 DIAGNOSIS — Z992 Dependence on renal dialysis: Secondary | ICD-10-CM | POA: Diagnosis not present

## 2023-09-15 DIAGNOSIS — D631 Anemia in chronic kidney disease: Secondary | ICD-10-CM | POA: Diagnosis not present

## 2023-09-15 DIAGNOSIS — I953 Hypotension of hemodialysis: Secondary | ICD-10-CM | POA: Diagnosis not present

## 2023-09-15 DIAGNOSIS — R252 Cramp and spasm: Secondary | ICD-10-CM | POA: Diagnosis not present

## 2023-09-17 DIAGNOSIS — D631 Anemia in chronic kidney disease: Secondary | ICD-10-CM | POA: Diagnosis not present

## 2023-09-17 DIAGNOSIS — Z992 Dependence on renal dialysis: Secondary | ICD-10-CM | POA: Diagnosis not present

## 2023-09-17 DIAGNOSIS — N2581 Secondary hyperparathyroidism of renal origin: Secondary | ICD-10-CM | POA: Diagnosis not present

## 2023-09-17 DIAGNOSIS — I953 Hypotension of hemodialysis: Secondary | ICD-10-CM | POA: Diagnosis not present

## 2023-09-17 DIAGNOSIS — R252 Cramp and spasm: Secondary | ICD-10-CM | POA: Diagnosis not present

## 2023-09-17 DIAGNOSIS — N186 End stage renal disease: Secondary | ICD-10-CM | POA: Diagnosis not present

## 2023-09-21 DIAGNOSIS — I953 Hypotension of hemodialysis: Secondary | ICD-10-CM | POA: Diagnosis not present

## 2023-09-21 DIAGNOSIS — Z992 Dependence on renal dialysis: Secondary | ICD-10-CM | POA: Diagnosis not present

## 2023-09-21 DIAGNOSIS — N2581 Secondary hyperparathyroidism of renal origin: Secondary | ICD-10-CM | POA: Diagnosis not present

## 2023-09-21 DIAGNOSIS — D631 Anemia in chronic kidney disease: Secondary | ICD-10-CM | POA: Diagnosis not present

## 2023-09-21 DIAGNOSIS — N186 End stage renal disease: Secondary | ICD-10-CM | POA: Diagnosis not present

## 2023-09-21 DIAGNOSIS — R252 Cramp and spasm: Secondary | ICD-10-CM | POA: Diagnosis not present

## 2023-09-22 DIAGNOSIS — I953 Hypotension of hemodialysis: Secondary | ICD-10-CM | POA: Diagnosis not present

## 2023-09-22 DIAGNOSIS — N186 End stage renal disease: Secondary | ICD-10-CM | POA: Diagnosis not present

## 2023-09-22 DIAGNOSIS — N2581 Secondary hyperparathyroidism of renal origin: Secondary | ICD-10-CM | POA: Diagnosis not present

## 2023-09-22 DIAGNOSIS — R252 Cramp and spasm: Secondary | ICD-10-CM | POA: Diagnosis not present

## 2023-09-22 DIAGNOSIS — Z992 Dependence on renal dialysis: Secondary | ICD-10-CM | POA: Diagnosis not present

## 2023-09-22 DIAGNOSIS — D631 Anemia in chronic kidney disease: Secondary | ICD-10-CM | POA: Diagnosis not present

## 2023-09-24 DIAGNOSIS — N2581 Secondary hyperparathyroidism of renal origin: Secondary | ICD-10-CM | POA: Diagnosis not present

## 2023-09-24 DIAGNOSIS — D631 Anemia in chronic kidney disease: Secondary | ICD-10-CM | POA: Diagnosis not present

## 2023-09-24 DIAGNOSIS — I953 Hypotension of hemodialysis: Secondary | ICD-10-CM | POA: Diagnosis not present

## 2023-09-24 DIAGNOSIS — Z992 Dependence on renal dialysis: Secondary | ICD-10-CM | POA: Diagnosis not present

## 2023-09-24 DIAGNOSIS — R252 Cramp and spasm: Secondary | ICD-10-CM | POA: Diagnosis not present

## 2023-09-24 DIAGNOSIS — N186 End stage renal disease: Secondary | ICD-10-CM | POA: Diagnosis not present

## 2023-09-25 DIAGNOSIS — D631 Anemia in chronic kidney disease: Secondary | ICD-10-CM | POA: Diagnosis not present

## 2023-09-25 DIAGNOSIS — R252 Cramp and spasm: Secondary | ICD-10-CM | POA: Diagnosis not present

## 2023-09-25 DIAGNOSIS — Z992 Dependence on renal dialysis: Secondary | ICD-10-CM | POA: Diagnosis not present

## 2023-09-25 DIAGNOSIS — N2581 Secondary hyperparathyroidism of renal origin: Secondary | ICD-10-CM | POA: Diagnosis not present

## 2023-09-25 DIAGNOSIS — I953 Hypotension of hemodialysis: Secondary | ICD-10-CM | POA: Diagnosis not present

## 2023-09-25 DIAGNOSIS — N186 End stage renal disease: Secondary | ICD-10-CM | POA: Diagnosis not present

## 2023-09-28 DIAGNOSIS — I953 Hypotension of hemodialysis: Secondary | ICD-10-CM | POA: Diagnosis not present

## 2023-09-28 DIAGNOSIS — D631 Anemia in chronic kidney disease: Secondary | ICD-10-CM | POA: Diagnosis not present

## 2023-09-28 DIAGNOSIS — N2581 Secondary hyperparathyroidism of renal origin: Secondary | ICD-10-CM | POA: Diagnosis not present

## 2023-09-28 DIAGNOSIS — N186 End stage renal disease: Secondary | ICD-10-CM | POA: Diagnosis not present

## 2023-09-28 DIAGNOSIS — R252 Cramp and spasm: Secondary | ICD-10-CM | POA: Diagnosis not present

## 2023-09-28 DIAGNOSIS — Z992 Dependence on renal dialysis: Secondary | ICD-10-CM | POA: Diagnosis not present

## 2023-09-29 DIAGNOSIS — Z992 Dependence on renal dialysis: Secondary | ICD-10-CM | POA: Diagnosis not present

## 2023-09-29 DIAGNOSIS — N2581 Secondary hyperparathyroidism of renal origin: Secondary | ICD-10-CM | POA: Diagnosis not present

## 2023-09-29 DIAGNOSIS — R252 Cramp and spasm: Secondary | ICD-10-CM | POA: Diagnosis not present

## 2023-09-29 DIAGNOSIS — I953 Hypotension of hemodialysis: Secondary | ICD-10-CM | POA: Diagnosis not present

## 2023-09-29 DIAGNOSIS — N186 End stage renal disease: Secondary | ICD-10-CM | POA: Diagnosis not present

## 2023-09-29 DIAGNOSIS — D631 Anemia in chronic kidney disease: Secondary | ICD-10-CM | POA: Diagnosis not present

## 2023-09-30 DIAGNOSIS — N186 End stage renal disease: Secondary | ICD-10-CM | POA: Diagnosis not present

## 2023-09-30 DIAGNOSIS — E1129 Type 2 diabetes mellitus with other diabetic kidney complication: Secondary | ICD-10-CM | POA: Diagnosis not present

## 2023-09-30 DIAGNOSIS — Z992 Dependence on renal dialysis: Secondary | ICD-10-CM | POA: Diagnosis not present

## 2023-10-01 DIAGNOSIS — R252 Cramp and spasm: Secondary | ICD-10-CM | POA: Diagnosis not present

## 2023-10-01 DIAGNOSIS — D509 Iron deficiency anemia, unspecified: Secondary | ICD-10-CM | POA: Diagnosis not present

## 2023-10-01 DIAGNOSIS — N186 End stage renal disease: Secondary | ICD-10-CM | POA: Diagnosis not present

## 2023-10-01 DIAGNOSIS — I953 Hypotension of hemodialysis: Secondary | ICD-10-CM | POA: Diagnosis not present

## 2023-10-01 DIAGNOSIS — Z992 Dependence on renal dialysis: Secondary | ICD-10-CM | POA: Diagnosis not present

## 2023-10-01 DIAGNOSIS — N2581 Secondary hyperparathyroidism of renal origin: Secondary | ICD-10-CM | POA: Diagnosis not present

## 2023-10-01 DIAGNOSIS — D631 Anemia in chronic kidney disease: Secondary | ICD-10-CM | POA: Diagnosis not present

## 2023-10-02 DIAGNOSIS — Z992 Dependence on renal dialysis: Secondary | ICD-10-CM | POA: Diagnosis not present

## 2023-10-02 DIAGNOSIS — R252 Cramp and spasm: Secondary | ICD-10-CM | POA: Diagnosis not present

## 2023-10-02 DIAGNOSIS — I953 Hypotension of hemodialysis: Secondary | ICD-10-CM | POA: Diagnosis not present

## 2023-10-02 DIAGNOSIS — D509 Iron deficiency anemia, unspecified: Secondary | ICD-10-CM | POA: Diagnosis not present

## 2023-10-02 DIAGNOSIS — D631 Anemia in chronic kidney disease: Secondary | ICD-10-CM | POA: Diagnosis not present

## 2023-10-02 DIAGNOSIS — N186 End stage renal disease: Secondary | ICD-10-CM | POA: Diagnosis not present

## 2023-10-02 DIAGNOSIS — N2581 Secondary hyperparathyroidism of renal origin: Secondary | ICD-10-CM | POA: Diagnosis not present

## 2023-10-05 DIAGNOSIS — N186 End stage renal disease: Secondary | ICD-10-CM | POA: Diagnosis not present

## 2023-10-05 DIAGNOSIS — R252 Cramp and spasm: Secondary | ICD-10-CM | POA: Diagnosis not present

## 2023-10-05 DIAGNOSIS — Z992 Dependence on renal dialysis: Secondary | ICD-10-CM | POA: Diagnosis not present

## 2023-10-05 DIAGNOSIS — N2581 Secondary hyperparathyroidism of renal origin: Secondary | ICD-10-CM | POA: Diagnosis not present

## 2023-10-05 DIAGNOSIS — D631 Anemia in chronic kidney disease: Secondary | ICD-10-CM | POA: Diagnosis not present

## 2023-10-05 DIAGNOSIS — D509 Iron deficiency anemia, unspecified: Secondary | ICD-10-CM | POA: Diagnosis not present

## 2023-10-05 DIAGNOSIS — I953 Hypotension of hemodialysis: Secondary | ICD-10-CM | POA: Diagnosis not present

## 2023-10-06 DIAGNOSIS — N2581 Secondary hyperparathyroidism of renal origin: Secondary | ICD-10-CM | POA: Diagnosis not present

## 2023-10-06 DIAGNOSIS — I953 Hypotension of hemodialysis: Secondary | ICD-10-CM | POA: Diagnosis not present

## 2023-10-06 DIAGNOSIS — N186 End stage renal disease: Secondary | ICD-10-CM | POA: Diagnosis not present

## 2023-10-06 DIAGNOSIS — D631 Anemia in chronic kidney disease: Secondary | ICD-10-CM | POA: Diagnosis not present

## 2023-10-06 DIAGNOSIS — D509 Iron deficiency anemia, unspecified: Secondary | ICD-10-CM | POA: Diagnosis not present

## 2023-10-06 DIAGNOSIS — R252 Cramp and spasm: Secondary | ICD-10-CM | POA: Diagnosis not present

## 2023-10-06 DIAGNOSIS — Z992 Dependence on renal dialysis: Secondary | ICD-10-CM | POA: Diagnosis not present

## 2023-10-08 DIAGNOSIS — N2581 Secondary hyperparathyroidism of renal origin: Secondary | ICD-10-CM | POA: Diagnosis not present

## 2023-10-08 DIAGNOSIS — N186 End stage renal disease: Secondary | ICD-10-CM | POA: Diagnosis not present

## 2023-10-08 DIAGNOSIS — Z992 Dependence on renal dialysis: Secondary | ICD-10-CM | POA: Diagnosis not present

## 2023-10-08 DIAGNOSIS — I953 Hypotension of hemodialysis: Secondary | ICD-10-CM | POA: Diagnosis not present

## 2023-10-08 DIAGNOSIS — D631 Anemia in chronic kidney disease: Secondary | ICD-10-CM | POA: Diagnosis not present

## 2023-10-08 DIAGNOSIS — R252 Cramp and spasm: Secondary | ICD-10-CM | POA: Diagnosis not present

## 2023-10-08 DIAGNOSIS — D509 Iron deficiency anemia, unspecified: Secondary | ICD-10-CM | POA: Diagnosis not present

## 2023-10-09 DIAGNOSIS — D509 Iron deficiency anemia, unspecified: Secondary | ICD-10-CM | POA: Diagnosis not present

## 2023-10-09 DIAGNOSIS — I953 Hypotension of hemodialysis: Secondary | ICD-10-CM | POA: Diagnosis not present

## 2023-10-09 DIAGNOSIS — R252 Cramp and spasm: Secondary | ICD-10-CM | POA: Diagnosis not present

## 2023-10-09 DIAGNOSIS — Z992 Dependence on renal dialysis: Secondary | ICD-10-CM | POA: Diagnosis not present

## 2023-10-09 DIAGNOSIS — N2581 Secondary hyperparathyroidism of renal origin: Secondary | ICD-10-CM | POA: Diagnosis not present

## 2023-10-09 DIAGNOSIS — N186 End stage renal disease: Secondary | ICD-10-CM | POA: Diagnosis not present

## 2023-10-09 DIAGNOSIS — D631 Anemia in chronic kidney disease: Secondary | ICD-10-CM | POA: Diagnosis not present

## 2023-10-12 DIAGNOSIS — D509 Iron deficiency anemia, unspecified: Secondary | ICD-10-CM | POA: Diagnosis not present

## 2023-10-12 DIAGNOSIS — I953 Hypotension of hemodialysis: Secondary | ICD-10-CM | POA: Diagnosis not present

## 2023-10-12 DIAGNOSIS — N2581 Secondary hyperparathyroidism of renal origin: Secondary | ICD-10-CM | POA: Diagnosis not present

## 2023-10-12 DIAGNOSIS — D631 Anemia in chronic kidney disease: Secondary | ICD-10-CM | POA: Diagnosis not present

## 2023-10-12 DIAGNOSIS — Z992 Dependence on renal dialysis: Secondary | ICD-10-CM | POA: Diagnosis not present

## 2023-10-12 DIAGNOSIS — N186 End stage renal disease: Secondary | ICD-10-CM | POA: Diagnosis not present

## 2023-10-12 DIAGNOSIS — R252 Cramp and spasm: Secondary | ICD-10-CM | POA: Diagnosis not present

## 2023-10-13 DIAGNOSIS — N2581 Secondary hyperparathyroidism of renal origin: Secondary | ICD-10-CM | POA: Diagnosis not present

## 2023-10-13 DIAGNOSIS — R252 Cramp and spasm: Secondary | ICD-10-CM | POA: Diagnosis not present

## 2023-10-13 DIAGNOSIS — Z992 Dependence on renal dialysis: Secondary | ICD-10-CM | POA: Diagnosis not present

## 2023-10-13 DIAGNOSIS — D509 Iron deficiency anemia, unspecified: Secondary | ICD-10-CM | POA: Diagnosis not present

## 2023-10-13 DIAGNOSIS — N186 End stage renal disease: Secondary | ICD-10-CM | POA: Diagnosis not present

## 2023-10-13 DIAGNOSIS — I953 Hypotension of hemodialysis: Secondary | ICD-10-CM | POA: Diagnosis not present

## 2023-10-13 DIAGNOSIS — D631 Anemia in chronic kidney disease: Secondary | ICD-10-CM | POA: Diagnosis not present

## 2023-10-14 DIAGNOSIS — R252 Cramp and spasm: Secondary | ICD-10-CM | POA: Diagnosis not present

## 2023-10-14 DIAGNOSIS — D509 Iron deficiency anemia, unspecified: Secondary | ICD-10-CM | POA: Diagnosis not present

## 2023-10-14 DIAGNOSIS — I953 Hypotension of hemodialysis: Secondary | ICD-10-CM | POA: Diagnosis not present

## 2023-10-14 DIAGNOSIS — N2581 Secondary hyperparathyroidism of renal origin: Secondary | ICD-10-CM | POA: Diagnosis not present

## 2023-10-14 DIAGNOSIS — Z992 Dependence on renal dialysis: Secondary | ICD-10-CM | POA: Diagnosis not present

## 2023-10-14 DIAGNOSIS — D631 Anemia in chronic kidney disease: Secondary | ICD-10-CM | POA: Diagnosis not present

## 2023-10-14 DIAGNOSIS — N186 End stage renal disease: Secondary | ICD-10-CM | POA: Diagnosis not present

## 2023-10-15 DIAGNOSIS — D631 Anemia in chronic kidney disease: Secondary | ICD-10-CM | POA: Diagnosis not present

## 2023-10-15 DIAGNOSIS — I953 Hypotension of hemodialysis: Secondary | ICD-10-CM | POA: Diagnosis not present

## 2023-10-15 DIAGNOSIS — N2581 Secondary hyperparathyroidism of renal origin: Secondary | ICD-10-CM | POA: Diagnosis not present

## 2023-10-15 DIAGNOSIS — R252 Cramp and spasm: Secondary | ICD-10-CM | POA: Diagnosis not present

## 2023-10-15 DIAGNOSIS — Z992 Dependence on renal dialysis: Secondary | ICD-10-CM | POA: Diagnosis not present

## 2023-10-15 DIAGNOSIS — D509 Iron deficiency anemia, unspecified: Secondary | ICD-10-CM | POA: Diagnosis not present

## 2023-10-15 DIAGNOSIS — N186 End stage renal disease: Secondary | ICD-10-CM | POA: Diagnosis not present

## 2023-10-16 DIAGNOSIS — D509 Iron deficiency anemia, unspecified: Secondary | ICD-10-CM | POA: Diagnosis not present

## 2023-10-16 DIAGNOSIS — R252 Cramp and spasm: Secondary | ICD-10-CM | POA: Diagnosis not present

## 2023-10-16 DIAGNOSIS — I953 Hypotension of hemodialysis: Secondary | ICD-10-CM | POA: Diagnosis not present

## 2023-10-16 DIAGNOSIS — N186 End stage renal disease: Secondary | ICD-10-CM | POA: Diagnosis not present

## 2023-10-16 DIAGNOSIS — D631 Anemia in chronic kidney disease: Secondary | ICD-10-CM | POA: Diagnosis not present

## 2023-10-16 DIAGNOSIS — N2581 Secondary hyperparathyroidism of renal origin: Secondary | ICD-10-CM | POA: Diagnosis not present

## 2023-10-16 DIAGNOSIS — Z992 Dependence on renal dialysis: Secondary | ICD-10-CM | POA: Diagnosis not present

## 2023-10-19 DIAGNOSIS — D631 Anemia in chronic kidney disease: Secondary | ICD-10-CM | POA: Diagnosis not present

## 2023-10-19 DIAGNOSIS — I953 Hypotension of hemodialysis: Secondary | ICD-10-CM | POA: Diagnosis not present

## 2023-10-19 DIAGNOSIS — N2581 Secondary hyperparathyroidism of renal origin: Secondary | ICD-10-CM | POA: Diagnosis not present

## 2023-10-19 DIAGNOSIS — Z992 Dependence on renal dialysis: Secondary | ICD-10-CM | POA: Diagnosis not present

## 2023-10-19 DIAGNOSIS — D509 Iron deficiency anemia, unspecified: Secondary | ICD-10-CM | POA: Diagnosis not present

## 2023-10-19 DIAGNOSIS — R252 Cramp and spasm: Secondary | ICD-10-CM | POA: Diagnosis not present

## 2023-10-19 DIAGNOSIS — N186 End stage renal disease: Secondary | ICD-10-CM | POA: Diagnosis not present

## 2023-10-20 DIAGNOSIS — N2581 Secondary hyperparathyroidism of renal origin: Secondary | ICD-10-CM | POA: Diagnosis not present

## 2023-10-20 DIAGNOSIS — D631 Anemia in chronic kidney disease: Secondary | ICD-10-CM | POA: Diagnosis not present

## 2023-10-20 DIAGNOSIS — R252 Cramp and spasm: Secondary | ICD-10-CM | POA: Diagnosis not present

## 2023-10-20 DIAGNOSIS — N186 End stage renal disease: Secondary | ICD-10-CM | POA: Diagnosis not present

## 2023-10-20 DIAGNOSIS — I953 Hypotension of hemodialysis: Secondary | ICD-10-CM | POA: Diagnosis not present

## 2023-10-20 DIAGNOSIS — Z992 Dependence on renal dialysis: Secondary | ICD-10-CM | POA: Diagnosis not present

## 2023-10-20 DIAGNOSIS — D509 Iron deficiency anemia, unspecified: Secondary | ICD-10-CM | POA: Diagnosis not present

## 2023-10-22 DIAGNOSIS — N186 End stage renal disease: Secondary | ICD-10-CM | POA: Diagnosis not present

## 2023-10-22 DIAGNOSIS — R252 Cramp and spasm: Secondary | ICD-10-CM | POA: Diagnosis not present

## 2023-10-22 DIAGNOSIS — D631 Anemia in chronic kidney disease: Secondary | ICD-10-CM | POA: Diagnosis not present

## 2023-10-22 DIAGNOSIS — I953 Hypotension of hemodialysis: Secondary | ICD-10-CM | POA: Diagnosis not present

## 2023-10-22 DIAGNOSIS — Z992 Dependence on renal dialysis: Secondary | ICD-10-CM | POA: Diagnosis not present

## 2023-10-22 DIAGNOSIS — D509 Iron deficiency anemia, unspecified: Secondary | ICD-10-CM | POA: Diagnosis not present

## 2023-10-22 DIAGNOSIS — N2581 Secondary hyperparathyroidism of renal origin: Secondary | ICD-10-CM | POA: Diagnosis not present

## 2023-10-23 DIAGNOSIS — N2581 Secondary hyperparathyroidism of renal origin: Secondary | ICD-10-CM | POA: Diagnosis not present

## 2023-10-23 DIAGNOSIS — N186 End stage renal disease: Secondary | ICD-10-CM | POA: Diagnosis not present

## 2023-10-23 DIAGNOSIS — Z992 Dependence on renal dialysis: Secondary | ICD-10-CM | POA: Diagnosis not present

## 2023-10-23 DIAGNOSIS — D509 Iron deficiency anemia, unspecified: Secondary | ICD-10-CM | POA: Diagnosis not present

## 2023-10-23 DIAGNOSIS — I953 Hypotension of hemodialysis: Secondary | ICD-10-CM | POA: Diagnosis not present

## 2023-10-23 DIAGNOSIS — R252 Cramp and spasm: Secondary | ICD-10-CM | POA: Diagnosis not present

## 2023-10-23 DIAGNOSIS — D631 Anemia in chronic kidney disease: Secondary | ICD-10-CM | POA: Diagnosis not present

## 2023-10-26 DIAGNOSIS — D631 Anemia in chronic kidney disease: Secondary | ICD-10-CM | POA: Diagnosis not present

## 2023-10-26 DIAGNOSIS — I953 Hypotension of hemodialysis: Secondary | ICD-10-CM | POA: Diagnosis not present

## 2023-10-26 DIAGNOSIS — N2581 Secondary hyperparathyroidism of renal origin: Secondary | ICD-10-CM | POA: Diagnosis not present

## 2023-10-26 DIAGNOSIS — R252 Cramp and spasm: Secondary | ICD-10-CM | POA: Diagnosis not present

## 2023-10-26 DIAGNOSIS — Z992 Dependence on renal dialysis: Secondary | ICD-10-CM | POA: Diagnosis not present

## 2023-10-26 DIAGNOSIS — N186 End stage renal disease: Secondary | ICD-10-CM | POA: Diagnosis not present

## 2023-10-26 DIAGNOSIS — D509 Iron deficiency anemia, unspecified: Secondary | ICD-10-CM | POA: Diagnosis not present

## 2023-10-27 DIAGNOSIS — R252 Cramp and spasm: Secondary | ICD-10-CM | POA: Diagnosis not present

## 2023-10-27 DIAGNOSIS — N186 End stage renal disease: Secondary | ICD-10-CM | POA: Diagnosis not present

## 2023-10-27 DIAGNOSIS — D509 Iron deficiency anemia, unspecified: Secondary | ICD-10-CM | POA: Diagnosis not present

## 2023-10-27 DIAGNOSIS — N2581 Secondary hyperparathyroidism of renal origin: Secondary | ICD-10-CM | POA: Diagnosis not present

## 2023-10-27 DIAGNOSIS — I953 Hypotension of hemodialysis: Secondary | ICD-10-CM | POA: Diagnosis not present

## 2023-10-27 DIAGNOSIS — Z992 Dependence on renal dialysis: Secondary | ICD-10-CM | POA: Diagnosis not present

## 2023-10-27 DIAGNOSIS — D631 Anemia in chronic kidney disease: Secondary | ICD-10-CM | POA: Diagnosis not present

## 2023-10-29 DIAGNOSIS — I953 Hypotension of hemodialysis: Secondary | ICD-10-CM | POA: Diagnosis not present

## 2023-10-29 DIAGNOSIS — N186 End stage renal disease: Secondary | ICD-10-CM | POA: Diagnosis not present

## 2023-10-29 DIAGNOSIS — R252 Cramp and spasm: Secondary | ICD-10-CM | POA: Diagnosis not present

## 2023-10-29 DIAGNOSIS — Z992 Dependence on renal dialysis: Secondary | ICD-10-CM | POA: Diagnosis not present

## 2023-10-29 DIAGNOSIS — N2581 Secondary hyperparathyroidism of renal origin: Secondary | ICD-10-CM | POA: Diagnosis not present

## 2023-10-29 DIAGNOSIS — D509 Iron deficiency anemia, unspecified: Secondary | ICD-10-CM | POA: Diagnosis not present

## 2023-10-29 DIAGNOSIS — D631 Anemia in chronic kidney disease: Secondary | ICD-10-CM | POA: Diagnosis not present

## 2023-10-30 DIAGNOSIS — N2581 Secondary hyperparathyroidism of renal origin: Secondary | ICD-10-CM | POA: Diagnosis not present

## 2023-10-30 DIAGNOSIS — Z992 Dependence on renal dialysis: Secondary | ICD-10-CM | POA: Diagnosis not present

## 2023-10-30 DIAGNOSIS — R252 Cramp and spasm: Secondary | ICD-10-CM | POA: Diagnosis not present

## 2023-10-30 DIAGNOSIS — D509 Iron deficiency anemia, unspecified: Secondary | ICD-10-CM | POA: Diagnosis not present

## 2023-10-30 DIAGNOSIS — D631 Anemia in chronic kidney disease: Secondary | ICD-10-CM | POA: Diagnosis not present

## 2023-10-30 DIAGNOSIS — N186 End stage renal disease: Secondary | ICD-10-CM | POA: Diagnosis not present

## 2023-10-30 DIAGNOSIS — I953 Hypotension of hemodialysis: Secondary | ICD-10-CM | POA: Diagnosis not present

## 2023-10-31 ENCOUNTER — Ambulatory Visit: Payer: Medicare Other | Admitting: Internal Medicine

## 2023-10-31 ENCOUNTER — Encounter: Payer: Self-pay | Admitting: Internal Medicine

## 2023-10-31 VITALS — BP 124/76 | HR 97 | Wt 191.0 lb

## 2023-10-31 DIAGNOSIS — Z794 Long term (current) use of insulin: Secondary | ICD-10-CM | POA: Diagnosis not present

## 2023-10-31 DIAGNOSIS — E1122 Type 2 diabetes mellitus with diabetic chronic kidney disease: Secondary | ICD-10-CM

## 2023-10-31 DIAGNOSIS — N186 End stage renal disease: Secondary | ICD-10-CM

## 2023-10-31 DIAGNOSIS — E1129 Type 2 diabetes mellitus with other diabetic kidney complication: Secondary | ICD-10-CM | POA: Diagnosis not present

## 2023-10-31 DIAGNOSIS — Z992 Dependence on renal dialysis: Secondary | ICD-10-CM | POA: Diagnosis not present

## 2023-10-31 LAB — POCT GLYCOSYLATED HEMOGLOBIN (HGB A1C): Hemoglobin A1C: 6.3 % — AB (ref 4.0–5.6)

## 2023-10-31 MED ORDER — INSULIN LISPRO 100 UNIT/ML IJ SOLN: INTRAMUSCULAR | 3 refills | Status: DC

## 2023-10-31 NOTE — Patient Instructions (Signed)

## 2023-10-31 NOTE — Progress Notes (Signed)
Name: Duane Hall  Age/ Sex: 67 y.o., male   MRN/ DOB: [id number], 1956-08-17     PCP: Roderick Pee, PA (Inactive)   Reason for Endocrinology Evaluation: Type 2 Diabetes Mellitus  Initial Endocrine Consultative Visit: 08/16/2020    PATIENT IDENTIFIER: Duane Hall is a 67 y.o. male with a past medical history of HTN, ESRD on HD, PVD, asthma, gastroparesis. The patient has followed with Endocrinology clinic since 08/16/2020 for consultative assistance with management of his diabetes.  DIABETIC HISTORY:  Duane Hall was diagnosed with DM 2005, and started insulin therapy in 2021, omnipod started 12/2020. Intolerant to Trulicity due to gastroparesis. His hemoglobin A1c has ranged from 5.6% in 2023, peaking at 11.8% in 2016.  He was followed by Dr. Everardo All on 2021 until 04/2022 SUBJECTIVE:   During the last visit (04/29/2023): A1c 5.6%     Today (10/31/2023): [name] is here for follow-up on diabetes management.Marland Kitchen  He checks his blood sugars multiple times daily, through CGM.  The patient has  not had hypoglycemic episodes since the last clinic visit.   S/P left great toe amputation 01/09/2023  Continues to follow-up with podiatry Patient continues to follow-up with vascular surgery for peripheral vascular disease  Has noted  nausea this am but no  vomiting  Continues with chronic GI issues with alternating constipation or diarrhea    Pt on home dialysis Sunday, Monday, Thursday and Wednesday    This patient with type 2 diabetes is treated with OmniPod 5 (insulin pump). During the visit the pump basal and bolus doses were reviewed including carb/insulin rations and supplemental doses. The clinical list was updated. The glucose meter download was reviewed in detail to determine if the current pump settings are providing the best glycemic control without excessive hypoglycemia.  Pump and meter download:       Pump   OmniPod 5  Settings   Insulin type   Humalog    Basal  rate          0000 0.10 u/h     0600 0.15 u/h     0900 0.35    2100 0.15 u/h          I:C ratio          0000  1:5                              Sensitivity          0000  40         Goal          00 00  110             Type & Model of Pump: OmniPod 5 Insulin Type: Currently using Humalog.  Body mass index is 27.41 kg/m.  PUMP STATISTICS: Average BG: 171 Average Daily Carbs (g): 56.5 Average Total Daily Insulin: 41.6 Average Daily Basal: 30.5 (73 %) Average Daily Bolus: 11.1 (27 %)    CONTINUOUS GLUCOSE MONITORING RECORD INTERPRETATION    Dates of Recording: 10/19-11/12/2022  Sensor description:dexcom  Results statistics:   CGM use % of time 91.4  Average and SD 171/44  Time in range   60%  % Time Above 180 35  % Time above 250 5  % Time Below target 0   Glycemic patterns summary: Optimal overnight and trend upwards during the day  Hyperglycemic episodes  postprandial   Hypoglycemic episodes occurred N/A  Overnight periods: Optimal  HOME DIABETES REGIMEN:  Humalog   Statin: Yes ACE-I/ARB: no    DIABETIC COMPLICATIONS: Microvascular complications:  Proliferative DR ( S/P laser) , ESRD on dialysis ,Right BKA , left great toe amputation Last Eye Exam: Completed 2022   Macrovascular complications:  PVD Denies: CAD, CVA   HISTORY:  Past Medical History:  Past Medical History:  Diagnosis Date   Asthma    Cancer (HCC)    Cataract    CKD (chronic kidney disease), stage III (HCC)    Dialysis [facility] at [facility]   Diabetes mellitus    Glaucoma    Hepatitis    Hep C   Hypertension    no longer   Osteomyelitis of great toe of left foot (HCC)    Vascular insufficiency 05/2020   Past Surgical History:  Past Surgical History:  Procedure Laterality Date   ABDOMINAL AORTOGRAM W/LOWER EXTREMITY N/A 06/19/2020   Procedure: ABDOMINAL AORTOGRAM W/LOWER EXTREMITY;  Surgeon: Maeola Harman, MD;  Location: [facility] INVASIVE  CV LAB;  Service: Cardiovascular;  Laterality: N/A;   ABDOMINAL AORTOGRAM W/LOWER EXTREMITY Left 10/16/2020   Procedure: ABDOMINAL AORTOGRAM W/LOWER EXTREMITY;  Surgeon: Maeola Harman, MD;  Location: [facility] INVASIVE CV LAB;  Service: Cardiovascular;  Laterality: Left;   ABDOMINAL AORTOGRAM W/LOWER EXTREMITY N/A 12/25/2020   Procedure: ABDOMINAL AORTOGRAM W/LOWER EXTREMITY;  Surgeon: Maeola Harman, MD;  Location: [facility] INVASIVE CV LAB;  Service: Cardiovascular;  Laterality: N/A;   ABDOMINAL AORTOGRAM W/LOWER EXTREMITY N/A 10/21/2022   Procedure: ABDOMINAL AORTOGRAM W/LOWER EXTREMITY;  Surgeon: Maeola Harman, MD;  Location: [facility] INVASIVE CV LAB;  Service: Cardiovascular;  Laterality: N/A;   AMPUTATION Right 01/12/2021   Procedure: 1ST AMPUTATION RAY;  Surgeon: Edwin Cap, DPM;  Location: [facility] OR;  Service: Podiatry;  Laterality: Right;   AMPUTATION Right 03/13/2021   Procedure: RIGHT BELOW KNEE AMPUTATION;  Surgeon: Maeola Harman, MD;  Location: [facility] OR;  Service: Vascular;  Laterality: Right;   AMPUTATION TOE Left 11/08/2022   Procedure: AMPUTATION TOE;  Surgeon: Edwin Cap, DPM;  Location: [facility] ORS;  Service: Podiatry;  Laterality: Left;  DR MCDONALD WILL BLOCK, ON STRETCHER   AMPUTATION TOE Left 01/09/2023   Procedure: AMPUTATION OF REMAINING LEFT GREAT TOE AND BONE BEHIND TOE;  Surgeon: Edwin Cap, DPM;  Location: [facility] OR;  Service: Podiatry;  Laterality: Left;   AV FISTULA PLACEMENT Right 09/14/2019   Procedure: Right Arm Basilic Vein transposition;  Surgeon: Chuck Hint, MD;  Location: [facility] OR;  Service: Vascular;  Laterality: Right;   BONE BIOPSY Left 07/29/2020   Procedure: BONE BIOPSY;  Surgeon: Edwin Cap, DPM;  Location: [facility] OR;  Service: Podiatry;  Laterality: Left;  Need bone trephines and/or large bore Giamshidi   COLONOSCOPY     COLONOSCOPY WITH PROPOFOL N/A 12/07/2021   Procedure: COLONOSCOPY WITH PROPOFOL;  Surgeon:  Jeani Hawking, MD;  Location: [facility] ENDOSCOPY;  Service: Endoscopy;  Laterality: N/A;   PERIPHERAL VASCULAR ATHERECTOMY Left 06/19/2020   Procedure: PERIPHERAL VASCULAR ATHERECTOMY;  Surgeon: Maeola Harman, MD;  Location: [facility] INVASIVE CV LAB;  Service: Cardiovascular;  Laterality: Left;  SFA   PERIPHERAL VASCULAR ATHERECTOMY  12/25/2020   Procedure: PERIPHERAL VASCULAR ATHERECTOMY;  Surgeon: Maeola Harman, MD;  Location: [facility] INVASIVE CV LAB;  Service: Cardiovascular;;  Lt. PT - Laser Lt. SFA - Laser   PERIPHERAL VASCULAR BALLOON ANGIOPLASTY  12/25/2020   Procedure: PERIPHERAL VASCULAR BALLOON ANGIOPLASTY;  Surgeon: Maeola Harman, MD;  Location: [facility] INVASIVE CV  LAB;  Service: Cardiovascular;;  Lt. SFA and PT   PERIPHERAL VASCULAR BALLOON ANGIOPLASTY Left 10/21/2022   Procedure: PERIPHERAL VASCULAR BALLOON ANGIOPLASTY;  Surgeon: Maeola Harman, MD;  Location: [facility] INVASIVE CV LAB;  Service: Cardiovascular;  Laterality: Left;   PERIPHERAL VASCULAR INTERVENTION Left 10/21/2022   Procedure: PERIPHERAL VASCULAR INTERVENTION;  Surgeon: Maeola Harman, MD;  Location: [facility] INVASIVE CV LAB;  Service: Cardiovascular;  Laterality: Left;   POLYPECTOMY  12/07/2021   Procedure: POLYPECTOMY;  Surgeon: Jeani Hawking, MD;  Location: [facility] ENDOSCOPY;  Service: Endoscopy;;   UPPER GASTROINTESTINAL ENDOSCOPY  02/2019   Dr Elnoria Howard     WISDOM TOOTH EXTRACTION     Social History:  reports that he has been smoking cigarettes. His smokeless tobacco use includes chew. He reports that he does not drink alcohol and does not use drugs. Family History:  Family History  Problem Relation Age of Onset   Cancer Father    Diabetes Mother      HOME MEDICATIONS: Allergies as of 10/31/2023       Reactions   Amoxicillin-pot Clavulanate Diarrhea   Midodrine Other (See Comments)   Other reaction(s): Urinary Sensation   Tape Other (See Comments)   Latex band aids cause blistering         Medication List        Accurate as of October 31, 2023  1:01 PM. If you have any questions, ask your nurse or doctor.          aspirin EC 81 MG tablet Take 1 tablet (81 mg total) by mouth daily. Swallow whole.   cholestyramine light 4 g packet Commonly known as: PREVALITE as directed orally 2 times a day for 30 day(s)   clopidogrel 75 MG tablet Commonly known as: PLAVIX Take 1 tablet (75 mg total) by mouth daily with breakfast.   clopidogrel 75 MG tablet Commonly known as: PLAVIX Take 1 tablet by mouth daily with breakfast.   Dexcom G6 Sensor Misc 1 Device by Does not apply route See admin instructions. Change every 10 days   Dexcom G6 Transmitter Misc Change every 3 mos   insulin lispro 100 UNIT/ML injection Commonly known as: HumaLOG For use in pump, total of 50 units per day   MIRCERA IJ Inject into the skin.   Omnipod 5 DexG7G6 Pods Gen 5 Misc 1 Device by Does not apply route every 3 (three) days.   pantoprazole 40 MG tablet Commonly known as: PROTONIX Take 1 tablet (40 mg total) by mouth daily.   pantoprazole 40 MG tablet Commonly known as: PROTONIX Take 1 tablet by mouth daily.   rosuvastatin 10 MG tablet Commonly known as: CRESTOR TAKE 1 TABLET BY MOUTH DAILY         OBJECTIVE:   Vital Signs: BP 124/76 (BP Location: Left Arm, Patient Position: Sitting, Cuff Size: Small)   Pulse 97   Wt 191 lb (86.6 kg)   SpO2 96%   BMI 27.41 kg/m   Wt Readings from Last 3 Encounters:  10/31/23 191 lb (86.6 kg)  05/28/23 189 lb (85.7 kg)  05/01/23 189 lb 12.8 oz (86.1 kg)     Exam: General: Pt appears well and is in NAD  Lungs: Clear with good BS bilat   Heart: RRR   Extremities: Right BKA   Neuro: MS is good with appropriate affect, pt is alert and Ox3    DM Foot Exam 07/29/2023 per podiatry   DATA REVIEWED:  Lab Results  Component Value Date  HGBA1C 6.3 (A) 10/31/2023   HGBA1C 5.6 04/29/2023   HGBA1C 5.7 (H) 11/07/2022      Latest Reference Range & Units 04/22/23 11:43  Sodium 135 - 146 mmol/L 140  Potassium 3.5 - 5.3 mmol/L 3.1 (L)  Chloride 98 - 110 mmol/L 99  CO2 20 - 32 mmol/L 31  Glucose 65 - 99 mg/dL 161 (H)  BUN 7 - 25 mg/dL 22  Creatinine 0.96 - 0.45 mg/dL 4.09 (H)  Calcium 8.6 - 10.3 mg/dL 8.3 (L)  BUN/Creatinine Ratio 6 - 22 (calc) 5 (L)  eGFR > OR = 60 mL/min/1.65m2 13 (L)  AG Ratio 1.0 - 2.5 (calc) 1.6  AST 10 - 35 U/L 13  ALT 9 - 46 U/L 8 (L)  Total Protein 6.1 - 8.1 g/dL 6.3  Total Bilirubin 0.2 - 1.2 mg/dL 0.4    ASSESSMENT / PLAN / RECOMMENDATIONS:   1) Type 2 Diabetes Mellitus, optimally controlled, With retinopathic complications, ESRD on HD and S/P BKA- Most recent A1c of 6.3 %. Goal A1c < 7.0 %.     - A1c skewed due to ESRD -Patient had questions regarding the OmniPod, for example the IOB remains the same despite changing the pod.  I did explain to the patient that insulin on board is stored in the PDM rather than the pod so changing the pad is not going to change the IOB -Patient is also questioning how initially he used to put 90 units of insulin in the past and now he is requiring 175 units in the pod.  I did go over the IQ technology and how insulin is used with hypoglycemia.  The patient has not been consistently entering carbohydrates into the pump specially for snacks and mornings, I did encourage the patient to enter carbohydrates each time he eats whether it is a meal or snack -I have offered to switch him to the Dexcom G7, but he has plenty of pods and G6 and would like to hold off on this for now -No changes at this time   MEDICATIONS: Humalog     Pump   OmniPod 5  Settings   Insulin type   Humalog   Basal rate       0000 0.10 u/h    0600 0.15 u/h    0900 0.35   2100 0.15 u/h       I:C ratio       0000  1:5                  Sensitivity       0000  40      Goal       0000  110          EDUCATION / INSTRUCTIONS: BG monitoring instructions: Patient  is instructed to check his blood sugars 3 times a day, before each meal . Call [place] if: BG persistently < 70  I reviewed the Rule of 15 for the treatment of hypoglycemia in detail with the patient. Literature supplied.    2) Diabetic complications:  Eye: Does  have known diabetic retinopathy.  Neuro/ Feet: Does not have known diabetic peripheral neuropathy .  Renal: Patient does  have known baseline CKD. He   is not on an ACEI/ARB at present.       F/U in 6 months    I spent 30 minutes preparing to see the patient by review of recent labs, imaging and procedures, obtaining and reviewing separately obtained history,  communicating with the patient, ordering medications, tests or procedures, and documenting clinical information in the EHR including the differential Dx, treatment, and any further evaluation and other management    Signed electronically by: Lyndle Herrlich, MD  [facility]  [facility] [address] [place], [place] [address] Phone: [phone] FAX: [phone]   CC: Roderick Pee, PA (Inactive) [address] [place] [address] Phone: [phone]  Fax: [phone]  Return to Endocrinology clinic as below: Future Appointments  Date Time Provider Department Center  12/02/2023 10:00 AM [facility] 3 [facility]  12/02/2023 11:00 AM [facility]  12/02/2023 11:15 AM [facility] PA-2 [facility]  05/04/2024 12:10 PM Strother Everitt, Konrad Dolores, MD [facility] None

## 2023-11-02 DIAGNOSIS — R252 Cramp and spasm: Secondary | ICD-10-CM | POA: Diagnosis not present

## 2023-11-02 DIAGNOSIS — N2581 Secondary hyperparathyroidism of renal origin: Secondary | ICD-10-CM | POA: Diagnosis not present

## 2023-11-02 DIAGNOSIS — I953 Hypotension of hemodialysis: Secondary | ICD-10-CM | POA: Diagnosis not present

## 2023-11-02 DIAGNOSIS — Z992 Dependence on renal dialysis: Secondary | ICD-10-CM | POA: Diagnosis not present

## 2023-11-02 DIAGNOSIS — N186 End stage renal disease: Secondary | ICD-10-CM | POA: Diagnosis not present

## 2023-11-03 ENCOUNTER — Encounter: Payer: Self-pay | Admitting: Internal Medicine

## 2023-11-03 DIAGNOSIS — I953 Hypotension of hemodialysis: Secondary | ICD-10-CM | POA: Diagnosis not present

## 2023-11-03 DIAGNOSIS — N2581 Secondary hyperparathyroidism of renal origin: Secondary | ICD-10-CM | POA: Diagnosis not present

## 2023-11-03 DIAGNOSIS — Z992 Dependence on renal dialysis: Secondary | ICD-10-CM | POA: Diagnosis not present

## 2023-11-03 DIAGNOSIS — R252 Cramp and spasm: Secondary | ICD-10-CM | POA: Diagnosis not present

## 2023-11-03 DIAGNOSIS — N186 End stage renal disease: Secondary | ICD-10-CM | POA: Diagnosis not present

## 2023-11-05 DIAGNOSIS — N2581 Secondary hyperparathyroidism of renal origin: Secondary | ICD-10-CM | POA: Diagnosis not present

## 2023-11-05 DIAGNOSIS — N186 End stage renal disease: Secondary | ICD-10-CM | POA: Diagnosis not present

## 2023-11-05 DIAGNOSIS — Z992 Dependence on renal dialysis: Secondary | ICD-10-CM | POA: Diagnosis not present

## 2023-11-05 DIAGNOSIS — R252 Cramp and spasm: Secondary | ICD-10-CM | POA: Diagnosis not present

## 2023-11-05 DIAGNOSIS — I953 Hypotension of hemodialysis: Secondary | ICD-10-CM | POA: Diagnosis not present

## 2023-11-06 DIAGNOSIS — N186 End stage renal disease: Secondary | ICD-10-CM | POA: Diagnosis not present

## 2023-11-06 DIAGNOSIS — N2581 Secondary hyperparathyroidism of renal origin: Secondary | ICD-10-CM | POA: Diagnosis not present

## 2023-11-06 DIAGNOSIS — R252 Cramp and spasm: Secondary | ICD-10-CM | POA: Diagnosis not present

## 2023-11-06 DIAGNOSIS — Z992 Dependence on renal dialysis: Secondary | ICD-10-CM | POA: Diagnosis not present

## 2023-11-06 DIAGNOSIS — I953 Hypotension of hemodialysis: Secondary | ICD-10-CM | POA: Diagnosis not present

## 2023-11-09 DIAGNOSIS — N2581 Secondary hyperparathyroidism of renal origin: Secondary | ICD-10-CM | POA: Diagnosis not present

## 2023-11-09 DIAGNOSIS — I953 Hypotension of hemodialysis: Secondary | ICD-10-CM | POA: Diagnosis not present

## 2023-11-09 DIAGNOSIS — R252 Cramp and spasm: Secondary | ICD-10-CM | POA: Diagnosis not present

## 2023-11-09 DIAGNOSIS — N186 End stage renal disease: Secondary | ICD-10-CM | POA: Diagnosis not present

## 2023-11-09 DIAGNOSIS — Z992 Dependence on renal dialysis: Secondary | ICD-10-CM | POA: Diagnosis not present

## 2023-11-10 DIAGNOSIS — Z992 Dependence on renal dialysis: Secondary | ICD-10-CM | POA: Diagnosis not present

## 2023-11-10 DIAGNOSIS — R252 Cramp and spasm: Secondary | ICD-10-CM | POA: Diagnosis not present

## 2023-11-10 DIAGNOSIS — N2581 Secondary hyperparathyroidism of renal origin: Secondary | ICD-10-CM | POA: Diagnosis not present

## 2023-11-10 DIAGNOSIS — I953 Hypotension of hemodialysis: Secondary | ICD-10-CM | POA: Diagnosis not present

## 2023-11-10 DIAGNOSIS — N186 End stage renal disease: Secondary | ICD-10-CM | POA: Diagnosis not present

## 2023-11-13 DIAGNOSIS — Z992 Dependence on renal dialysis: Secondary | ICD-10-CM | POA: Diagnosis not present

## 2023-11-13 DIAGNOSIS — I953 Hypotension of hemodialysis: Secondary | ICD-10-CM | POA: Diagnosis not present

## 2023-11-13 DIAGNOSIS — N2581 Secondary hyperparathyroidism of renal origin: Secondary | ICD-10-CM | POA: Diagnosis not present

## 2023-11-13 DIAGNOSIS — N186 End stage renal disease: Secondary | ICD-10-CM | POA: Diagnosis not present

## 2023-11-13 DIAGNOSIS — R252 Cramp and spasm: Secondary | ICD-10-CM | POA: Diagnosis not present

## 2023-11-16 DIAGNOSIS — N2581 Secondary hyperparathyroidism of renal origin: Secondary | ICD-10-CM | POA: Diagnosis not present

## 2023-11-16 DIAGNOSIS — I953 Hypotension of hemodialysis: Secondary | ICD-10-CM | POA: Diagnosis not present

## 2023-11-16 DIAGNOSIS — R252 Cramp and spasm: Secondary | ICD-10-CM | POA: Diagnosis not present

## 2023-11-16 DIAGNOSIS — N186 End stage renal disease: Secondary | ICD-10-CM | POA: Diagnosis not present

## 2023-11-16 DIAGNOSIS — Z992 Dependence on renal dialysis: Secondary | ICD-10-CM | POA: Diagnosis not present

## 2023-11-17 DIAGNOSIS — Z992 Dependence on renal dialysis: Secondary | ICD-10-CM | POA: Diagnosis not present

## 2023-11-17 DIAGNOSIS — N2581 Secondary hyperparathyroidism of renal origin: Secondary | ICD-10-CM | POA: Diagnosis not present

## 2023-11-17 DIAGNOSIS — R252 Cramp and spasm: Secondary | ICD-10-CM | POA: Diagnosis not present

## 2023-11-17 DIAGNOSIS — I953 Hypotension of hemodialysis: Secondary | ICD-10-CM | POA: Diagnosis not present

## 2023-11-17 DIAGNOSIS — N186 End stage renal disease: Secondary | ICD-10-CM | POA: Diagnosis not present

## 2023-11-19 DIAGNOSIS — I953 Hypotension of hemodialysis: Secondary | ICD-10-CM | POA: Diagnosis not present

## 2023-11-19 DIAGNOSIS — R252 Cramp and spasm: Secondary | ICD-10-CM | POA: Diagnosis not present

## 2023-11-19 DIAGNOSIS — Z992 Dependence on renal dialysis: Secondary | ICD-10-CM | POA: Diagnosis not present

## 2023-11-19 DIAGNOSIS — N2581 Secondary hyperparathyroidism of renal origin: Secondary | ICD-10-CM | POA: Diagnosis not present

## 2023-11-19 DIAGNOSIS — N186 End stage renal disease: Secondary | ICD-10-CM | POA: Diagnosis not present

## 2023-11-20 DIAGNOSIS — N2581 Secondary hyperparathyroidism of renal origin: Secondary | ICD-10-CM | POA: Diagnosis not present

## 2023-11-20 DIAGNOSIS — Z992 Dependence on renal dialysis: Secondary | ICD-10-CM | POA: Diagnosis not present

## 2023-11-20 DIAGNOSIS — I953 Hypotension of hemodialysis: Secondary | ICD-10-CM | POA: Diagnosis not present

## 2023-11-20 DIAGNOSIS — R252 Cramp and spasm: Secondary | ICD-10-CM | POA: Diagnosis not present

## 2023-11-20 DIAGNOSIS — N186 End stage renal disease: Secondary | ICD-10-CM | POA: Diagnosis not present

## 2023-11-24 DIAGNOSIS — R252 Cramp and spasm: Secondary | ICD-10-CM | POA: Diagnosis not present

## 2023-11-24 DIAGNOSIS — I953 Hypotension of hemodialysis: Secondary | ICD-10-CM | POA: Diagnosis not present

## 2023-11-24 DIAGNOSIS — N186 End stage renal disease: Secondary | ICD-10-CM | POA: Diagnosis not present

## 2023-11-24 DIAGNOSIS — Z992 Dependence on renal dialysis: Secondary | ICD-10-CM | POA: Diagnosis not present

## 2023-11-24 DIAGNOSIS — N2581 Secondary hyperparathyroidism of renal origin: Secondary | ICD-10-CM | POA: Diagnosis not present

## 2023-11-26 DIAGNOSIS — Z992 Dependence on renal dialysis: Secondary | ICD-10-CM | POA: Diagnosis not present

## 2023-11-26 DIAGNOSIS — N186 End stage renal disease: Secondary | ICD-10-CM | POA: Diagnosis not present

## 2023-11-26 DIAGNOSIS — I953 Hypotension of hemodialysis: Secondary | ICD-10-CM | POA: Diagnosis not present

## 2023-11-26 DIAGNOSIS — N2581 Secondary hyperparathyroidism of renal origin: Secondary | ICD-10-CM | POA: Diagnosis not present

## 2023-11-26 DIAGNOSIS — R252 Cramp and spasm: Secondary | ICD-10-CM | POA: Diagnosis not present

## 2023-11-27 DIAGNOSIS — I953 Hypotension of hemodialysis: Secondary | ICD-10-CM | POA: Diagnosis not present

## 2023-11-27 DIAGNOSIS — R252 Cramp and spasm: Secondary | ICD-10-CM | POA: Diagnosis not present

## 2023-11-27 DIAGNOSIS — N2581 Secondary hyperparathyroidism of renal origin: Secondary | ICD-10-CM | POA: Diagnosis not present

## 2023-11-27 DIAGNOSIS — Z992 Dependence on renal dialysis: Secondary | ICD-10-CM | POA: Diagnosis not present

## 2023-11-27 DIAGNOSIS — N186 End stage renal disease: Secondary | ICD-10-CM | POA: Diagnosis not present

## 2023-11-30 DIAGNOSIS — E1129 Type 2 diabetes mellitus with other diabetic kidney complication: Secondary | ICD-10-CM | POA: Diagnosis not present

## 2023-11-30 DIAGNOSIS — Z992 Dependence on renal dialysis: Secondary | ICD-10-CM | POA: Diagnosis not present

## 2023-11-30 DIAGNOSIS — N186 End stage renal disease: Secondary | ICD-10-CM | POA: Diagnosis not present

## 2023-12-01 DIAGNOSIS — N2581 Secondary hyperparathyroidism of renal origin: Secondary | ICD-10-CM | POA: Diagnosis not present

## 2023-12-01 DIAGNOSIS — I953 Hypotension of hemodialysis: Secondary | ICD-10-CM | POA: Diagnosis not present

## 2023-12-01 DIAGNOSIS — Z992 Dependence on renal dialysis: Secondary | ICD-10-CM | POA: Diagnosis not present

## 2023-12-01 DIAGNOSIS — D509 Iron deficiency anemia, unspecified: Secondary | ICD-10-CM | POA: Diagnosis not present

## 2023-12-01 DIAGNOSIS — R252 Cramp and spasm: Secondary | ICD-10-CM | POA: Diagnosis not present

## 2023-12-01 DIAGNOSIS — N186 End stage renal disease: Secondary | ICD-10-CM | POA: Diagnosis not present

## 2023-12-02 ENCOUNTER — Ambulatory Visit (INDEPENDENT_AMBULATORY_CARE_PROVIDER_SITE_OTHER): Payer: Medicare Other | Admitting: Physician Assistant

## 2023-12-02 ENCOUNTER — Ambulatory Visit (INDEPENDENT_AMBULATORY_CARE_PROVIDER_SITE_OTHER)
Admission: RE | Admit: 2023-12-02 | Discharge: 2023-12-02 | Disposition: A | Discharge status: Home / Self Care | Payer: Medicare Other | Source: Ambulatory Visit | Attending: Vascular Surgery

## 2023-12-02 ENCOUNTER — Ambulatory Visit (HOSPITAL_COMMUNITY)
Admission: RE | Admit: 2023-12-02 | Discharge: 2023-12-02 | Disposition: A | Discharge status: Home / Self Care | Payer: Medicare Other | Source: Ambulatory Visit | Attending: Vascular Surgery | Admitting: Vascular Surgery

## 2023-12-02 VITALS — BP 127/79 | HR 86 | Temp 98.0°F | Resp 18 | Ht 69.0 in | Wt 192.7 lb

## 2023-12-02 DIAGNOSIS — I739 Peripheral vascular disease, unspecified: Secondary | ICD-10-CM

## 2023-12-02 DIAGNOSIS — I70222 Atherosclerosis of native arteries of extremities with rest pain, left leg: Secondary | ICD-10-CM | POA: Diagnosis not present

## 2023-12-02 LAB — VAS US ABI WITH/WO TBI: Left ABI: 0.92

## 2023-12-02 NOTE — Progress Notes (Unsigned)
Office Note   History of Present Illness   Duane Hall is a 67 y.o. ([birth year]) male who presents for surveillance of PAD. They have a history of ***  The patient returns today for follow up. He/she denies any recent medical history changes. The patient also denies any claudication, rest pain, or tissue loss of the lower extremities.  Current Outpatient Medications  Medication Sig Dispense Refill   aspirin EC 81 MG EC tablet Take 1 tablet (81 mg total) by mouth daily. Swallow whole. 30 tablet 11   cholestyramine light (PREVALITE) 4 g packet as directed orally 2 times a day for 30 day(s)     clopidogrel (PLAVIX) 75 MG tablet Take 1 tablet (75 mg total) by mouth daily with breakfast. 30 tablet 3   Continuous Glucose Sensor (DEXCOM G6 SENSOR) MISC 1 Device by Does not apply route See admin instructions. Change every 10 days 9 each 3   Continuous Glucose Transmitter (DEXCOM G6 TRANSMITTER) MISC Change every 3 mos 1 each 3   Insulin Disposable Pump (OMNIPOD 5 G6 PODS, GEN 5,) MISC 1 Device by Does not apply route every 3 (three) days. 30 each 3   insulin lispro (HUMALOG) 100 UNIT/ML injection For use in pump, total of 60 units per day 60 mL 3   Methoxy PEG-Epoetin Beta (MIRCERA IJ) Inject into the skin.     pantoprazole (PROTONIX) 40 MG tablet Take 1 tablet (40 mg total) by mouth daily.     rosuvastatin (CRESTOR) 10 MG tablet TAKE 1 TABLET BY MOUTH DAILY 30 tablet 11   clopidogrel (PLAVIX) 75 MG tablet Take 1 tablet by mouth daily with breakfast.     pantoprazole (PROTONIX) 40 MG tablet Take 1 tablet by mouth daily.     No current facility-administered medications for this visit.    ***REVIEW OF SYSTEMS (negative unless checked):   Cardiac:  []  Chest pain or chest pressure? []  Shortness of breath upon activity? []  Shortness of breath when lying flat? []  Irregular heart rhythm?  Vascular:  []  Pain in calf, thigh, or hip brought on by walking? []  Pain in feet at night that wakes  you up from your sleep? []  Blood clot in your veins? []  Leg swelling?  Pulmonary:  []  Oxygen at home? []  Productive cough? []  Wheezing?  Neurologic:  []  Sudden weakness in arms or legs? []  Sudden numbness in arms or legs? []  Sudden onset of difficult speaking or slurred speech? []  Temporary loss of vision in one eye? []  Problems with dizziness?  Gastrointestinal:  []  Blood in stool? []  Vomited blood?  Genitourinary:  []  Burning when urinating? []  Blood in urine?  Psychiatric:  []  Major depression  Hematologic:  []  Bleeding problems? []  Problems with blood clotting?  Dermatologic:  []  Rashes or ulcers?  Constitutional:  []  Fever or chills?  Ear/Nose/Throat:  []  Change in hearing? []  Nose bleeds? []  Sore throat?  Musculoskeletal:  []  Back pain? []  Joint pain? []  Muscle pain?   Physical Examination  *** Vitals:   12/02/23 1053  BP: 127/79  Pulse: 86  Resp: 18  Temp: 98 F (36.7 C)  TempSrc: Temporal  SpO2: 97%  Weight: 192 lb 11.2 oz (87.4 kg)  Height: 5\' 9"  (1.753 m)   ***Body mass index is 28.46 kg/m.  General:  WDWN in NAD; vital signs documented above Gait: Not observed HENT: WNL, normocephalic Pulmonary: normal non-labored breathing , without rales, rhonchi,  wheezing Cardiac: {Desc; regular/irreg:14544} HR, without murmurs {With/Without:20273} carotid bruit***  Abdomen: soft, NT, no masses Skin: {With/Without:20273} rashes Vascular Exam/Pulses: Palpable/nonpalpable femoral pulses, palpable/nonpalpable popliteal pulses, palpable/nonpalpable pedal pulses. Left DP/PT/Peroneal doppler signals. Right DP/PT/Peroneal doppler signals Extremities: {With/Without:20273} ischemic changes, {With/Without:20273} gangrene , {With/Without:20273} cellulitis; {With/Without:20273} open wounds;  Musculoskeletal: no muscle wasting or atrophy  Neurologic: A&O X 3;  No focal weakness or paresthesias are detected Psychiatric:  The pt has {Desc;  normal/abnormal:11317::"Normal"} affect.  Non-Invasive Vascular imaging   ABI (***) R:  ABI: *** (***),  PT: {Signals:19197::"none","mono","bi","tri"} DP: {Signals:19197::"none","mono","bi","tri"} TBI:  *** L:  ABI: *** (***),  PT: {Signals:19197::"none","mono","bi","tri"} DP: {Signals:19197::"none","mono","bi","tri"} TBI: ***  Aortoiliac Duplex (***)   *** Arterial Duplex (***)   Medical Decision Making   Duane Hall is a 67 y.o. male who presents for surveillance of PAD  Based on the patient's vascular studies, their ABIs are essentially unchanged since last visit. *** Arterial duplex *** The patient denies any claudication, rest pain, or tissue loss. They have palpable/nonpalpable pedal pulses with *** doppler signals They will continue their *** and follow up with our office in *** months/year with ABIs and ***   Loel Dubonnet PA-C [facility] Office: [phone]  Clinic MD: Lenell Antu

## 2023-12-03 DIAGNOSIS — N186 End stage renal disease: Secondary | ICD-10-CM | POA: Diagnosis not present

## 2023-12-03 DIAGNOSIS — N2581 Secondary hyperparathyroidism of renal origin: Secondary | ICD-10-CM | POA: Diagnosis not present

## 2023-12-03 DIAGNOSIS — Z992 Dependence on renal dialysis: Secondary | ICD-10-CM | POA: Diagnosis not present

## 2023-12-03 DIAGNOSIS — D509 Iron deficiency anemia, unspecified: Secondary | ICD-10-CM | POA: Diagnosis not present

## 2023-12-03 DIAGNOSIS — I953 Hypotension of hemodialysis: Secondary | ICD-10-CM | POA: Diagnosis not present

## 2023-12-03 DIAGNOSIS — R252 Cramp and spasm: Secondary | ICD-10-CM | POA: Diagnosis not present

## 2023-12-05 ENCOUNTER — Other Ambulatory Visit: Payer: Self-pay

## 2023-12-05 DIAGNOSIS — I739 Peripheral vascular disease, unspecified: Secondary | ICD-10-CM

## 2023-12-08 ENCOUNTER — Other Ambulatory Visit: Payer: Self-pay

## 2023-12-08 ENCOUNTER — Ambulatory Visit (HOSPITAL_COMMUNITY)
Admission: RE | Admit: 2023-12-08 | Discharge: 2023-12-08 | Disposition: A | Discharge status: Home / Self Care | Payer: Medicare Other | Attending: Nephrology | Admitting: Nephrology

## 2023-12-08 ENCOUNTER — Encounter (HOSPITAL_COMMUNITY)
Admission: RE | Disposition: A | Discharge status: Home / Self Care | Payer: Self-pay | Source: Home / Self Care | Attending: Nephrology

## 2023-12-08 DIAGNOSIS — D509 Iron deficiency anemia, unspecified: Secondary | ICD-10-CM | POA: Diagnosis not present

## 2023-12-08 DIAGNOSIS — Z95820 Peripheral vascular angioplasty status with implants and grafts: Secondary | ICD-10-CM | POA: Insufficient documentation

## 2023-12-08 DIAGNOSIS — Z992 Dependence on renal dialysis: Secondary | ICD-10-CM | POA: Insufficient documentation

## 2023-12-08 DIAGNOSIS — Z89412 Acquired absence of left great toe: Secondary | ICD-10-CM | POA: Insufficient documentation

## 2023-12-08 DIAGNOSIS — Z955 Presence of coronary angioplasty implant and graft: Secondary | ICD-10-CM | POA: Insufficient documentation

## 2023-12-08 DIAGNOSIS — D649 Anemia, unspecified: Secondary | ICD-10-CM | POA: Insufficient documentation

## 2023-12-08 DIAGNOSIS — N186 End stage renal disease: Secondary | ICD-10-CM | POA: Insufficient documentation

## 2023-12-08 DIAGNOSIS — N250 Renal osteodystrophy: Secondary | ICD-10-CM | POA: Insufficient documentation

## 2023-12-08 DIAGNOSIS — D631 Anemia in chronic kidney disease: Secondary | ICD-10-CM | POA: Diagnosis not present

## 2023-12-08 DIAGNOSIS — F17210 Nicotine dependence, cigarettes, uncomplicated: Secondary | ICD-10-CM | POA: Diagnosis not present

## 2023-12-08 DIAGNOSIS — T82858A Stenosis of vascular prosthetic devices, implants and grafts, initial encounter: Secondary | ICD-10-CM | POA: Insufficient documentation

## 2023-12-08 DIAGNOSIS — B1920 Unspecified viral hepatitis C without hepatic coma: Secondary | ICD-10-CM | POA: Diagnosis not present

## 2023-12-08 DIAGNOSIS — Z833 Family history of diabetes mellitus: Secondary | ICD-10-CM | POA: Diagnosis not present

## 2023-12-08 DIAGNOSIS — Z89511 Acquired absence of right leg below knee: Secondary | ICD-10-CM | POA: Diagnosis not present

## 2023-12-08 DIAGNOSIS — F17220 Nicotine dependence, chewing tobacco, uncomplicated: Secondary | ICD-10-CM | POA: Insufficient documentation

## 2023-12-08 DIAGNOSIS — E1122 Type 2 diabetes mellitus with diabetic chronic kidney disease: Secondary | ICD-10-CM | POA: Insufficient documentation

## 2023-12-08 DIAGNOSIS — E1151 Type 2 diabetes mellitus with diabetic peripheral angiopathy without gangrene: Secondary | ICD-10-CM | POA: Diagnosis not present

## 2023-12-08 DIAGNOSIS — F17200 Nicotine dependence, unspecified, uncomplicated: Secondary | ICD-10-CM | POA: Insufficient documentation

## 2023-12-08 DIAGNOSIS — I120 Hypertensive chronic kidney disease with stage 5 chronic kidney disease or end stage renal disease: Secondary | ICD-10-CM | POA: Insufficient documentation

## 2023-12-08 DIAGNOSIS — Z794 Long term (current) use of insulin: Secondary | ICD-10-CM | POA: Insufficient documentation

## 2023-12-08 DIAGNOSIS — T82510A Breakdown (mechanical) of surgically created arteriovenous fistula, initial encounter: Secondary | ICD-10-CM | POA: Diagnosis not present

## 2023-12-08 DIAGNOSIS — Y832 Surgical operation with anastomosis, bypass or graft as the cause of abnormal reaction of the patient, or of later complication, without mention of misadventure at the time of the procedure: Secondary | ICD-10-CM | POA: Insufficient documentation

## 2023-12-08 DIAGNOSIS — N2581 Secondary hyperparathyroidism of renal origin: Secondary | ICD-10-CM | POA: Diagnosis not present

## 2023-12-08 DIAGNOSIS — R252 Cramp and spasm: Secondary | ICD-10-CM | POA: Diagnosis not present

## 2023-12-08 DIAGNOSIS — I953 Hypotension of hemodialysis: Secondary | ICD-10-CM | POA: Diagnosis not present

## 2023-12-08 HISTORY — PX: A/V FISTULAGRAM: CATH118298

## 2023-12-08 HISTORY — PX: PERIPHERAL VASCULAR BALLOON ANGIOPLASTY: CATH118281

## 2023-12-08 SURGERY — A/V FISTULAGRAM
Anesthesia: LOCAL | Laterality: Right

## 2023-12-08 MED ORDER — FENTANYL CITRATE (PF) 100 MCG/2ML IJ SOLN
INTRAMUSCULAR | Status: DC | PRN
  Administered 2023-12-08: 25 ug via INTRAVENOUS

## 2023-12-08 MED ORDER — LIDOCAINE HCL (PF) 1 % IJ SOLN
INTRAMUSCULAR | Status: AC
  Filled 2023-12-08: qty 30

## 2023-12-08 MED ORDER — IOHEXOL 350 MG/ML SOLN
INTRAVENOUS | Status: DC | PRN
  Administered 2023-12-08: 20 mL

## 2023-12-08 MED ORDER — FENTANYL CITRATE (PF) 100 MCG/2ML IJ SOLN
INTRAMUSCULAR | Status: AC
  Filled 2023-12-08: qty 2

## 2023-12-08 MED ORDER — HEPARIN (PORCINE) IN NACL 1000-0.9 UT/500ML-% IV SOLN
INTRAVENOUS | Status: DC | PRN
  Administered 2023-12-08: 500 mL

## 2023-12-08 MED ORDER — SODIUM CHLORIDE 0.9% FLUSH: 10.0000 mL | Freq: Two times a day (BID) | INTRAVENOUS | Status: DC

## 2023-12-08 MED ORDER — ONDANSETRON HCL 4 MG/2ML IJ SOLN: 4.0000 mg | Freq: Four times a day (QID) | INTRAMUSCULAR | Status: DC | PRN

## 2023-12-08 MED ORDER — LIDOCAINE HCL (PF) 1 % IJ SOLN
INTRAMUSCULAR | Status: DC | PRN
  Administered 2023-12-08: 2 mL via INTRADERMAL

## 2023-12-08 MED ORDER — MIDAZOLAM HCL 2 MG/2ML IJ SOLN
INTRAMUSCULAR | Status: DC | PRN
  Administered 2023-12-08: 1 mg via INTRAVENOUS

## 2023-12-08 MED ORDER — MIDAZOLAM HCL 2 MG/2ML IJ SOLN
INTRAMUSCULAR | Status: AC
  Filled 2023-12-08: qty 2

## 2023-12-08 MED ORDER — ACETAMINOPHEN 325 MG PO TABS: 650.0000 mg | ORAL_TABLET | ORAL | Status: DC | PRN

## 2023-12-08 MED ORDER — SODIUM CHLORIDE 0.9% FLUSH: 3.0000 mL | INTRAVENOUS | Status: DC | PRN

## 2023-12-08 SURGICAL SUPPLY — 10 items
BAG SNAP BAND KOVER 36X36 (MISCELLANEOUS) ×2 IMPLANT
BALLN MUSTANG 7.0X40 75 (BALLOONS) ×2
BALLOON MUSTANG 7.0X40 75 (BALLOONS) IMPLANT
CATH ANGIO 5F BER2 65CM (CATHETERS) IMPLANT
COVER DOME SNAP 22 D (MISCELLANEOUS) ×2 IMPLANT
GUIDEWIRE ANGLED .035X150CM (WIRE) IMPLANT
SHEATH PINNACLE R/O II 6F 4CM (SHEATH) IMPLANT
SHEATH PROBE COVER 6X72 (BAG) ×2 IMPLANT
SYR MEDALLION 10ML (SYRINGE) IMPLANT
TRAY PV CATH (CUSTOM PROCEDURE TRAY) ×2 IMPLANT

## 2023-12-08 NOTE — Discharge Instructions (Signed)
General care instructions: - Do not drive or operate heavy machinery for 24hrs - Avoid making any important decisions for the remainder of the day. - You should be able to eat, drink, and resume your normal medications. - Avoid any strenuous activity for the remainder of the day. Potential complications: - Your hand is more cold or numb than usual. - You are bleeding at the site and it will not stop with direct pressure. If it was a declot expect some oozing at the site. Avoid extreme pressure to the site. - You have a change in the bruit and /or thrill in your fistula or graft. - You have a fever, swelling, see redness or feel heat at or near the puncture site. Medication instructions: - Continue routine medications unless otherwise instructed. 4.  May resume attempts at cannulating the venous buttonhole site but if unsuccessful will need to start a new venous buttonhole.

## 2023-12-08 NOTE — Op Note (Signed)
Patient presents for concerns of difficult venous buttonhole cannulation in his right  BBT.  Patient last had 7 mm inflow juxta anastomotic angioplasty on August 05, 2023 by Dr. Glenna Fellows.   On the physical exam, the fistula is hyperpulsatile at the inflow.  Summary:  1)      The patient had successful angioplasty (7 mm Mustang  FE ~16 atm) of significant stenosis in the inflow basilic vein juxta site.  2)      The body of the fistula had an aneurysm though it is otherwise patent; the axillary vein, outflow basilic swing segment and centrals are widely patent. Anastomosis was also widely patent.  Suture removed and postop 3)      This right BBT remains amenable to future percutaneous intervention.  Description of procedure: The arm was prepped and draped in the usual sterile fashion. The right upper arm brachial basilic fistula was cannulated (40981) with an 18G Angiocath needle directed in a retrograde direction in venous limb of the fistula. A guidewire was inserted and exchanged for a 6 Fr sheath. Contrast [phone]) injection via the side port of the sheath was performed. The angiogram of the fistula ([id number]) showed a patent body of the fistula, medium size aneurysm in the arterial limb, patent outflow basilic swing site, axillary vein, and centrals.   Guidewire was advanced into the proximal brachial artery and antegrade arteriogram through a Bernstein catheter revealed a patent arterial anastomosis, 50% inflow basilic vein stenosis above the juxta segment.  .  A 7 mm Mustang angioplasty balloon was then inserted over the guidewire and positioned at the basilic vein inflow stenosis.   Venous angioplasty (21308) was carried out to 16 ATM with FULL effacement of the waist on the balloon at the basilic inflow swing site lesion. The repeat angiogram showed 10% residual stenosis with no evidence of extravasation or dissection.  Hemostasis: A 3-0 ethilon purse string suture was placed at the cannulation  site on removal of the sheath.  Sedation: 1 mg Versed, 25 mcg Fentanyl.  Contrast. 20 mL  Monitoring: Because of the patient's comorbid conditions and sedation during the procedure, continuous EKG monitoring and O2 saturation monitoring was performed throughout the procedure by the RN. There were no abnormal arrhythmias encountered.  Complications: None  Diagnoses: I87.1 Stricture of vein  N18.6 ESRD T82.858A Stricture of access  Procedure Coding:  [id number] Cannulation and angiogram of fistula, venous angioplasty (basilic vein inflow site)  [id number] Contrast  Recommendations:  1. Continue to cannulate the fistula with 15G needles.  2. Refer for problems with flows/swelling. 3.  Resume cannulation protocol but if there are problems cannulating the venous buttonhole then will need to establish a  new venous buttonhole  Discharge: The patient was discharged home in stable condition. The patient was given education regarding the care of the dialysis access AVF and specific instructions in case of any problems.

## 2023-12-08 NOTE — H&P (Addendum)
Chief Complaint: Decreased flows  Interval H&P  The patient has presented today for an angiogram/ angioplasty; patient is followed at home therapies with Dr. Verna Czech.   Various methods of treatment have been discussed with the patient.  After consideration of risk, benefits and other options for treatment, the patient has consented to a angiogram/ angioplasty with  possible stent placement.   Risks of angiogram with potential angioplasty and stenting if needed.contrast reaction, extravasation/ bleeding, dissection, hypotension and death were explained to the patient.  The patient's history has been reviewed and the patient has been examined, no changes in status.  Stable for angiogram/angioplasty  I have reviewed the patient's chart and labs.  Questions were answered to the patient's satisfaction.  Assessment/Plan: ESRD dialyzing at Sunday Monday Wednesday Thursday last treatment on Wednesday.  Home dialysis with Dr. Marisue Humble. Decreased access flows and difficult cannulation -currently being dialyzed with buttonholes BUT there have been problems with the VENOUS button hole. The primary person cannulating her is the spouse who has started buttonholes in the past.  His last dialysis treatment was on Wednesday.  Planning on angiogram with possibly angioplasty as the inflow is hyper pulsatile suggestive of a juxta anastomotic stenosis.  He has had a 7 mm juxta anastomotic balloon angioplasty on August 05, 2023 by Dr. Glenna Fellows. Renal osteodystrophy - continue binders per home regimen. Anemia - managed with ESA's and IV iron at dialysis center. HTN - resume home regimen. PAD - recently seen in the office by [facility] on 12/02/2023; history of right BKA in 2022 as well as left SFA and popliteal artery stenting with balloon angioplasty of left TP trunk 10/21/2022 by Dr. Randie Heinz.  Patient has also had left great toe amputation on 01/09/2023 by Dr. Lilian Kapur.   HPI: Duane Hall is an 67 y.o. male hypertension,  diabetes, hepatitis C, osteomyelitis of the left great toe status post amputation with well-healed wound, ESRD.  Patient being referred for focal cannulation through his right upper arm brachiobasilic fistula.  Last treatment was on Wednesday as his wife was not able to access the venous buttonhole.    Patient denies fever, chills, nausea, vomiting, shortness of breath, chest pain.    ROS Per HPI.  Chemistry and CBC: Creat  Date/Time Value Ref Range Status  04/22/2023 11:43 AM 4.75 (H) 0.70 - 1.35 mg/dL Final  78/29/5621 30:86 AM 2.22 (H) 0.50 - 1.35 mg/dL Final  57/84/6962 95:28 PM 2.09 (H) 0.50 - 1.35 mg/dL Final  41/32/4401 02:72 PM 2.15 (H) 0.50 - 1.35 mg/dL Final   Creatinine, Ser  Date/Time Value Ref Range Status  01/09/2023 03:08 PM 6.60 (H) 0.61 - 1.24 mg/dL Final  53/66/4403 47:42 PM 8.90 (H) 0.61 - 1.24 mg/dL Final  59/56/3875 64:33 AM 5.99 (H) 0.61 - 1.24 mg/dL Final  29/51/8841 66:06 AM 6.70 (H) 0.61 - 1.24 mg/dL Final  30/16/0109 32:35 AM 5.10 (H) 0.61 - 1.24 mg/dL Final  57/32/2025 42:70 AM 6.39 (H) 0.61 - 1.24 mg/dL Final  62/37/6283 15:17 PM 5.76 (H) 0.61 - 1.24 mg/dL Final  61/60/7371 06:26 PM 6.69 (H) 0.61 - 1.24 mg/dL Final  94/85/4627 03:50 PM 7.36 (H) 0.61 - 1.24 mg/dL Final  09/38/1829 93:71 AM 5.64 (H) 0.61 - 1.24 mg/dL Final    Comment:    DELTA CHECK NOTED  03/15/2021 02:49 AM 3.65 (H) 0.61 - 1.24 mg/dL Final    Comment:    DELTA CHECK NOTED  03/13/2021 05:36 AM 9.23 (H) 0.61 - 1.24 mg/dL Final  69/67/8938 10:17 AM 7.46 (  H) 0.61 - 1.24 mg/dL Final  24/40/1027 25:36 AM 4.48 (H) 0.61 - 1.24 mg/dL Final    Comment:    DIALYSIS  03/09/2021 05:33 AM 7.25 (H) 0.61 - 1.24 mg/dL Final  64/40/3474 25:95 PM 6.79 (H) 0.61 - 1.24 mg/dL Final  63/87/5643 32:95 PM 6.35 (H) 0.61 - 1.24 mg/dL Final  18/84/1660 63:01 PM 5.13 (H) 0.61 - 1.24 mg/dL Final  60/09/9322 55:73 AM 4.77 (H) 0.61 - 1.24 mg/dL Final  22/01/5426 06:23 PM 3.39 (H) 0.61 - 1.24 mg/dL Final   76/28/3151 76:16 AM 6.30 (H) 0.61 - 1.24 mg/dL Final  07/37/1062 69:48 AM 5.70 (H) 0.61 - 1.24 mg/dL Final  54/62/7035 00:93 PM 6.96 (H) 0.61 - 1.24 mg/dL Final  81/82/9937 16:96 AM 3.30 (H) 0.61 - 1.24 mg/dL Final    Comment:    DELTA CHECK NOTED  07/28/2020 10:53 PM 6.67 (H) 0.61 - 1.24 mg/dL Final  78/93/8101 75:10 AM 4.67 (H) 0.61 - 1.24 mg/dL Final  25/85/2778 24:23 PM 4.39 (H) 0.61 - 1.24 mg/dL Final  53/61/4431 54:00 AM 9.06 (H) 0.61 - 1.24 mg/dL Final  86/76/1950 93:26 PM 8.12 (H) 0.61 - 1.24 mg/dL Final  71/24/5809 98:33 AM 7.27 (H) 0.61 - 1.24 mg/dL Final  82/50/5397 67:34 AM 7.03 (H) 0.61 - 1.24 mg/dL Final  19/37/9024 09:73 AM 6.92 (H) 0.61 - 1.24 mg/dL Final  53/29/9242 68:34 AM 7.07 (H) 0.61 - 1.24 mg/dL Final  19/62/2297 98:92 AM 6.85 (H) 0.61 - 1.24 mg/dL Final  11/94/1740 81:44 AM 6.97 (H) 0.61 - 1.24 mg/dL Final  81/85/6314 97:02 PM 7.10 (H) 0.61 - 1.24 mg/dL Final  63/78/5885 02:77 PM 6.86 (H) 0.61 - 1.24 mg/dL Final  41/28/7867 67:20 PM 6.86 (H) 0.61 - 1.24 mg/dL Final  94/70/9628 36:62 PM 2.09 (H) 0.61 - 1.24 mg/dL Final   No results for input(s): "NA", "K", "CL", "CO2", "GLUCOSE", "BUN", "CREATININE", "CALCIUM", "PHOS" in the last 168 hours.  Invalid input(s): "ALB" No results for input(s): "WBC", "NEUTROABS", "HGB", "HCT", "MCV", "PLT" in the last 168 hours. Liver Function Tests: No results for input(s): "AST", "ALT", "ALKPHOS", "BILITOT", "PROT", "ALBUMIN" in the last 168 hours. No results for input(s): "LIPASE", "AMYLASE" in the last 168 hours. No results for input(s): "AMMONIA" in the last 168 hours. Cardiac Enzymes: No results for input(s): "CKTOTAL", "CKMB", "CKMBINDEX", "TROPONINI" in the last 168 hours. Iron Studies: No results for input(s): "IRON", "TIBC", "TRANSFERRIN", "FERRITIN" in the last 72 hours. PT/INR: @LABRCNTIP (inr:5)  Xrays/Other Studies: )No results found for this or any previous visit (from the past 48 hour(s)). No results  found.  PMH:   Past Medical History:  Diagnosis Date   Asthma    Cancer (HCC)    Cataract    CKD (chronic kidney disease), stage III (HCC)    Dialysis [facility] at [facility]   Diabetes mellitus    Glaucoma    Hepatitis    Hep C   Hypertension    no longer   Osteomyelitis of great toe of left foot [facility])    Vascular insufficiency 05/2020    PSH:   Past Surgical History:  Procedure Laterality Date   ABDOMINAL AORTOGRAM W/LOWER EXTREMITY N/A 06/19/2020   Procedure: ABDOMINAL AORTOGRAM W/LOWER EXTREMITY;  Surgeon: Maeola Harman, MD;  Location: [facility] INVASIVE CV LAB;  Service: Cardiovascular;  Laterality: N/A;   ABDOMINAL AORTOGRAM W/LOWER EXTREMITY Left 10/16/2020   Procedure: ABDOMINAL AORTOGRAM W/LOWER EXTREMITY;  Surgeon: Maeola Harman, MD;  Location: [facility] INVASIVE CV LAB;  Service: Cardiovascular;  Laterality: Left;   ABDOMINAL AORTOGRAM W/LOWER EXTREMITY N/A 12/25/2020   Procedure: ABDOMINAL AORTOGRAM W/LOWER EXTREMITY;  Surgeon: Maeola Harman, MD;  Location: [facility] INVASIVE CV LAB;  Service: Cardiovascular;  Laterality: N/A;   ABDOMINAL AORTOGRAM W/LOWER EXTREMITY N/A 10/21/2022   Procedure: ABDOMINAL AORTOGRAM W/LOWER EXTREMITY;  Surgeon: Maeola Harman, MD;  Location: [facility]VASIVE CV LAB;  Service: Cardiovascular;  Laterality: N/A;   AMPUTATION Right 01/12/2021   Procedure: 1ST AMPUTATION RAY;  Surgeon: Edwin Cap, DPM;  Location: [facility] OR;  Service: Podiatry;  Laterality: Right;   AMPUTATION Right 03/13/2021   Procedure: RIGHT BELOW KNEE AMPUTATION;  Surgeon: Maeola Harman, MD;  Location: [facility] OR;  Service: Vascular;  Laterality: Right;   AMPUTATION TOE Left 11/08/2022   Procedure: AMPUTATION TOE;  Surgeon: Edwin Cap, DPM;  Location: [facility] ORS;  Service: Podiatry;  Laterality: Left;  DR MCDONALD WILL BLOCK, ON STRETCHER   AMPUTATION TOE Left 01/09/2023   Procedure: AMPUTATION OF REMAINING LEFT GREAT TOE AND BONE  BEHIND TOE;  Surgeon: Edwin Cap, DPM;  Location: [facility] OR;  Service: Podiatry;  Laterality: Left;   AV FISTULA PLACEMENT Right 09/14/2019   Procedure: Right Arm Basilic Vein transposition;  Surgeon: Chuck Hint, MD;  Location: [facility] OR;  Service: Vascular;  Laterality: Right;   BONE BIOPSY Left 07/29/2020   Procedure: BONE BIOPSY;  Surgeon: Edwin Cap, DPM;  Location: [facility] OR;  Service: Podiatry;  Laterality: Left;  Need bone trephines and/or large bore Giamshidi   COLONOSCOPY     COLONOSCOPY WITH PROPOFOL N/A 12/07/2021   Procedure: COLONOSCOPY WITH PROPOFOL;  Surgeon: Jeani Hawking, MD;  Location: [facility] ENDOSCOPY;  Service: Endoscopy;  Laterality: N/A;   PERIPHERAL VASCULAR ATHERECTOMY Left 06/19/2020   Procedure: PERIPHERAL VASCULAR ATHERECTOMY;  Surgeon: Maeola Harman, MD;  Location: [facility] INVASIVE CV LAB;  Service: Cardiovascular;  Laterality: Left;  SFA   PERIPHERAL VASCULAR ATHERECTOMY  12/25/2020   Procedure: PERIPHERAL VASCULAR ATHERECTOMY;  Surgeon: Maeola Harman, MD;  Location: [facility] INVASIVE CV LAB;  Service: Cardiovascular;;  Lt. PT - Laser Lt. SFA - Laser   PERIPHERAL VASCULAR BALLOON ANGIOPLASTY  12/25/2020   Procedure: PERIPHERAL VASCULAR BALLOON ANGIOPLASTY;  Surgeon: Maeola Harman, MD;  Location: [facility] INVASIVE CV LAB;  Service: Cardiovascular;;  Lt. SFA and PT   PERIPHERAL VASCULAR BALLOON ANGIOPLASTY Left 10/21/2022   Procedure: PERIPHERAL VASCULAR BALLOON ANGIOPLASTY;  Surgeon: Maeola Harman, MD;  Location: [facility] INVASIVE CV LAB;  Service: Cardiovascular;  Laterality: Left;   PERIPHERAL VASCULAR INTERVENTION Left 10/21/2022   Procedure: PERIPHERAL VASCULAR INTERVENTION;  Surgeon: Maeola Harman, MD;  Location: [facility] INVASIVE CV LAB;  Service: Cardiovascular;  Laterality: Left;   POLYPECTOMY  12/07/2021   Procedure: POLYPECTOMY;  Surgeon: Jeani Hawking, MD;  Location: [facility] ENDOSCOPY;  Service: Endoscopy;;   UPPER  GASTROINTESTINAL ENDOSCOPY  02/2019   Dr Elnoria Howard     WISDOM TOOTH EXTRACTION      Allergies:  Allergies  Allergen Reactions   Amoxicillin-Pot Clavulanate Diarrhea   Midodrine Other (See Comments)    Other reaction(s): Urinary Sensation   Tape Other (See Comments)    Latex band aids cause blistering    Medications:   Prior to Admission medications   Medication Sig Start Date End Date Taking? Authorizing Provider  aspirin EC 81 MG EC tablet Take 1 tablet (81 mg total) by mouth daily. Swallow whole. 06/21/20  Yes Marinda Elk, MD  clopidogrel (PLAVIX) 75 MG tablet Take 1 tablet (75  mg total) by mouth daily with breakfast. 06/21/20  Yes Marinda Elk, MD  Continuous Glucose Sensor (DEXCOM G6 SENSOR) MISC 1 Device by Does not apply route See admin instructions. Change every 10 days 05/05/23  Yes Shamleffer, Konrad Dolores, MD  Continuous Glucose Transmitter (DEXCOM G6 TRANSMITTER) MISC Change every 3 mos 05/05/23  Yes Shamleffer, Konrad Dolores, MD  Insulin Disposable Pump (OMNIPOD 5 G6 PODS, GEN 5,) MISC 1 Device by Does not apply route every 3 (three) days. 05/05/23  Yes Shamleffer, Konrad Dolores, MD  insulin lispro (HUMALOG) 100 UNIT/ML injection For use in pump, total of 60 units per day 10/31/23  Yes Shamleffer, Konrad Dolores, MD  pantoprazole (PROTONIX) 40 MG tablet Take 1 tablet (40 mg total) by mouth daily. 03/17/21  Yes Pokhrel, Laxman, MD  rosuvastatin (CRESTOR) 10 MG tablet TAKE 1 TABLET BY MOUTH DAILY 08/17/20  Yes Maeola Harman, MD  Methoxy PEG-Epoetin Beta (MIRCERA IJ) Inject into the skin. 07/28/23   [provider]    Discontinued Meds:   Medications Discontinued During This Encounter  Medication Reason   pantoprazole (PROTONIX) 40 MG tablet Patient Preference   clopidogrel (PLAVIX) 75 MG tablet Duplicate   cholestyramine light (PREVALITE) 4 g packet Patient Preference    Social History:  reports that he has been smoking cigarettes. His  smokeless tobacco use includes chew. He reports that he does not drink alcohol and does not use drugs.  Family History:   Family History  Problem Relation Age of Onset   Cancer Father    Diabetes Mother     Pulse 82, resp. rate 16, SpO2 100%. alert, nad , chronic ill appearing No jvd Chest cta bilat Cor reg no RG Abd soft ntnd no ascites Ext no LE edema Alert, NF, ox3 Rt BBT +bruit hyperpulsatile at arterial anastomosis level, but no do not appear infected and there is no erythema overlying or aneurysms present       Ethelene Hal, MD 12/08/2023, 11:55 AM

## 2023-12-09 ENCOUNTER — Encounter (HOSPITAL_COMMUNITY): Payer: Self-pay | Admitting: Nephrology

## 2023-12-10 DIAGNOSIS — D509 Iron deficiency anemia, unspecified: Secondary | ICD-10-CM | POA: Diagnosis not present

## 2023-12-10 DIAGNOSIS — Z992 Dependence on renal dialysis: Secondary | ICD-10-CM | POA: Diagnosis not present

## 2023-12-10 DIAGNOSIS — N2581 Secondary hyperparathyroidism of renal origin: Secondary | ICD-10-CM | POA: Diagnosis not present

## 2023-12-10 DIAGNOSIS — N186 End stage renal disease: Secondary | ICD-10-CM | POA: Diagnosis not present

## 2023-12-10 DIAGNOSIS — R252 Cramp and spasm: Secondary | ICD-10-CM | POA: Diagnosis not present

## 2023-12-10 DIAGNOSIS — I953 Hypotension of hemodialysis: Secondary | ICD-10-CM | POA: Diagnosis not present

## 2023-12-11 DIAGNOSIS — I953 Hypotension of hemodialysis: Secondary | ICD-10-CM | POA: Diagnosis not present

## 2023-12-11 DIAGNOSIS — D509 Iron deficiency anemia, unspecified: Secondary | ICD-10-CM | POA: Diagnosis not present

## 2023-12-11 DIAGNOSIS — N2581 Secondary hyperparathyroidism of renal origin: Secondary | ICD-10-CM | POA: Diagnosis not present

## 2023-12-11 DIAGNOSIS — R252 Cramp and spasm: Secondary | ICD-10-CM | POA: Diagnosis not present

## 2023-12-11 DIAGNOSIS — N186 End stage renal disease: Secondary | ICD-10-CM | POA: Diagnosis not present

## 2023-12-11 DIAGNOSIS — Z992 Dependence on renal dialysis: Secondary | ICD-10-CM | POA: Diagnosis not present

## 2023-12-14 DIAGNOSIS — N2581 Secondary hyperparathyroidism of renal origin: Secondary | ICD-10-CM | POA: Diagnosis not present

## 2023-12-14 DIAGNOSIS — D509 Iron deficiency anemia, unspecified: Secondary | ICD-10-CM | POA: Diagnosis not present

## 2023-12-14 DIAGNOSIS — N186 End stage renal disease: Secondary | ICD-10-CM | POA: Diagnosis not present

## 2023-12-14 DIAGNOSIS — I953 Hypotension of hemodialysis: Secondary | ICD-10-CM | POA: Diagnosis not present

## 2023-12-14 DIAGNOSIS — R252 Cramp and spasm: Secondary | ICD-10-CM | POA: Diagnosis not present

## 2023-12-14 DIAGNOSIS — Z992 Dependence on renal dialysis: Secondary | ICD-10-CM | POA: Diagnosis not present

## 2023-12-15 DIAGNOSIS — N2581 Secondary hyperparathyroidism of renal origin: Secondary | ICD-10-CM | POA: Diagnosis not present

## 2023-12-15 DIAGNOSIS — R252 Cramp and spasm: Secondary | ICD-10-CM | POA: Diagnosis not present

## 2023-12-15 DIAGNOSIS — D509 Iron deficiency anemia, unspecified: Secondary | ICD-10-CM | POA: Diagnosis not present

## 2023-12-15 DIAGNOSIS — N186 End stage renal disease: Secondary | ICD-10-CM | POA: Diagnosis not present

## 2023-12-15 DIAGNOSIS — I953 Hypotension of hemodialysis: Secondary | ICD-10-CM | POA: Diagnosis not present

## 2023-12-15 DIAGNOSIS — Z992 Dependence on renal dialysis: Secondary | ICD-10-CM | POA: Diagnosis not present

## 2023-12-17 DIAGNOSIS — R252 Cramp and spasm: Secondary | ICD-10-CM | POA: Diagnosis not present

## 2023-12-17 DIAGNOSIS — D509 Iron deficiency anemia, unspecified: Secondary | ICD-10-CM | POA: Diagnosis not present

## 2023-12-17 DIAGNOSIS — I953 Hypotension of hemodialysis: Secondary | ICD-10-CM | POA: Diagnosis not present

## 2023-12-17 DIAGNOSIS — N186 End stage renal disease: Secondary | ICD-10-CM | POA: Diagnosis not present

## 2023-12-17 DIAGNOSIS — Z992 Dependence on renal dialysis: Secondary | ICD-10-CM | POA: Diagnosis not present

## 2023-12-17 DIAGNOSIS — N2581 Secondary hyperparathyroidism of renal origin: Secondary | ICD-10-CM | POA: Diagnosis not present

## 2023-12-18 DIAGNOSIS — Z992 Dependence on renal dialysis: Secondary | ICD-10-CM | POA: Diagnosis not present

## 2023-12-18 DIAGNOSIS — N2581 Secondary hyperparathyroidism of renal origin: Secondary | ICD-10-CM | POA: Diagnosis not present

## 2023-12-18 DIAGNOSIS — N186 End stage renal disease: Secondary | ICD-10-CM | POA: Diagnosis not present

## 2023-12-18 DIAGNOSIS — D509 Iron deficiency anemia, unspecified: Secondary | ICD-10-CM | POA: Diagnosis not present

## 2023-12-18 DIAGNOSIS — R252 Cramp and spasm: Secondary | ICD-10-CM | POA: Diagnosis not present

## 2023-12-18 DIAGNOSIS — I953 Hypotension of hemodialysis: Secondary | ICD-10-CM | POA: Diagnosis not present

## 2023-12-21 DIAGNOSIS — D509 Iron deficiency anemia, unspecified: Secondary | ICD-10-CM | POA: Diagnosis not present

## 2023-12-21 DIAGNOSIS — N2581 Secondary hyperparathyroidism of renal origin: Secondary | ICD-10-CM | POA: Diagnosis not present

## 2023-12-21 DIAGNOSIS — R252 Cramp and spasm: Secondary | ICD-10-CM | POA: Diagnosis not present

## 2023-12-21 DIAGNOSIS — I953 Hypotension of hemodialysis: Secondary | ICD-10-CM | POA: Diagnosis not present

## 2023-12-21 DIAGNOSIS — Z992 Dependence on renal dialysis: Secondary | ICD-10-CM | POA: Diagnosis not present

## 2023-12-21 DIAGNOSIS — N186 End stage renal disease: Secondary | ICD-10-CM | POA: Diagnosis not present

## 2023-12-22 DIAGNOSIS — R252 Cramp and spasm: Secondary | ICD-10-CM | POA: Diagnosis not present

## 2023-12-22 DIAGNOSIS — N2581 Secondary hyperparathyroidism of renal origin: Secondary | ICD-10-CM | POA: Diagnosis not present

## 2023-12-22 DIAGNOSIS — D509 Iron deficiency anemia, unspecified: Secondary | ICD-10-CM | POA: Diagnosis not present

## 2023-12-22 DIAGNOSIS — N186 End stage renal disease: Secondary | ICD-10-CM | POA: Diagnosis not present

## 2023-12-22 DIAGNOSIS — Z992 Dependence on renal dialysis: Secondary | ICD-10-CM | POA: Diagnosis not present

## 2023-12-22 DIAGNOSIS — I953 Hypotension of hemodialysis: Secondary | ICD-10-CM | POA: Diagnosis not present

## 2023-12-25 DIAGNOSIS — N186 End stage renal disease: Secondary | ICD-10-CM | POA: Diagnosis not present

## 2023-12-25 DIAGNOSIS — N2581 Secondary hyperparathyroidism of renal origin: Secondary | ICD-10-CM | POA: Diagnosis not present

## 2023-12-25 DIAGNOSIS — R252 Cramp and spasm: Secondary | ICD-10-CM | POA: Diagnosis not present

## 2023-12-25 DIAGNOSIS — Z992 Dependence on renal dialysis: Secondary | ICD-10-CM | POA: Diagnosis not present

## 2023-12-25 DIAGNOSIS — I953 Hypotension of hemodialysis: Secondary | ICD-10-CM | POA: Diagnosis not present

## 2023-12-25 DIAGNOSIS — D509 Iron deficiency anemia, unspecified: Secondary | ICD-10-CM | POA: Diagnosis not present

## 2023-12-28 DIAGNOSIS — D509 Iron deficiency anemia, unspecified: Secondary | ICD-10-CM | POA: Diagnosis not present

## 2023-12-28 DIAGNOSIS — N186 End stage renal disease: Secondary | ICD-10-CM | POA: Diagnosis not present

## 2023-12-28 DIAGNOSIS — N2581 Secondary hyperparathyroidism of renal origin: Secondary | ICD-10-CM | POA: Diagnosis not present

## 2023-12-28 DIAGNOSIS — I953 Hypotension of hemodialysis: Secondary | ICD-10-CM | POA: Diagnosis not present

## 2023-12-28 DIAGNOSIS — Z992 Dependence on renal dialysis: Secondary | ICD-10-CM | POA: Diagnosis not present

## 2023-12-28 DIAGNOSIS — R252 Cramp and spasm: Secondary | ICD-10-CM | POA: Diagnosis not present

## 2023-12-29 DIAGNOSIS — Z992 Dependence on renal dialysis: Secondary | ICD-10-CM | POA: Diagnosis not present

## 2023-12-29 DIAGNOSIS — I953 Hypotension of hemodialysis: Secondary | ICD-10-CM | POA: Diagnosis not present

## 2023-12-29 DIAGNOSIS — D509 Iron deficiency anemia, unspecified: Secondary | ICD-10-CM | POA: Diagnosis not present

## 2023-12-29 DIAGNOSIS — N2581 Secondary hyperparathyroidism of renal origin: Secondary | ICD-10-CM | POA: Diagnosis not present

## 2023-12-29 DIAGNOSIS — R252 Cramp and spasm: Secondary | ICD-10-CM | POA: Diagnosis not present

## 2023-12-29 DIAGNOSIS — N186 End stage renal disease: Secondary | ICD-10-CM | POA: Diagnosis not present

## 2023-12-31 DIAGNOSIS — E1129 Type 2 diabetes mellitus with other diabetic kidney complication: Secondary | ICD-10-CM | POA: Diagnosis not present

## 2023-12-31 DIAGNOSIS — R252 Cramp and spasm: Secondary | ICD-10-CM | POA: Diagnosis not present

## 2023-12-31 DIAGNOSIS — Z4931 Encounter for adequacy testing for hemodialysis: Secondary | ICD-10-CM | POA: Diagnosis not present

## 2023-12-31 DIAGNOSIS — Z992 Dependence on renal dialysis: Secondary | ICD-10-CM | POA: Diagnosis not present

## 2023-12-31 DIAGNOSIS — I953 Hypotension of hemodialysis: Secondary | ICD-10-CM | POA: Diagnosis not present

## 2023-12-31 DIAGNOSIS — N186 End stage renal disease: Secondary | ICD-10-CM | POA: Diagnosis not present

## 2023-12-31 DIAGNOSIS — D631 Anemia in chronic kidney disease: Secondary | ICD-10-CM | POA: Diagnosis not present

## 2023-12-31 DIAGNOSIS — N2581 Secondary hyperparathyroidism of renal origin: Secondary | ICD-10-CM | POA: Diagnosis not present

## 2024-01-01 DIAGNOSIS — R252 Cramp and spasm: Secondary | ICD-10-CM | POA: Diagnosis not present

## 2024-01-01 DIAGNOSIS — D631 Anemia in chronic kidney disease: Secondary | ICD-10-CM | POA: Diagnosis not present

## 2024-01-01 DIAGNOSIS — Z4931 Encounter for adequacy testing for hemodialysis: Secondary | ICD-10-CM | POA: Diagnosis not present

## 2024-01-01 DIAGNOSIS — E1129 Type 2 diabetes mellitus with other diabetic kidney complication: Secondary | ICD-10-CM | POA: Diagnosis not present

## 2024-01-01 DIAGNOSIS — I953 Hypotension of hemodialysis: Secondary | ICD-10-CM | POA: Diagnosis not present

## 2024-01-01 DIAGNOSIS — N186 End stage renal disease: Secondary | ICD-10-CM | POA: Diagnosis not present

## 2024-01-01 DIAGNOSIS — Z992 Dependence on renal dialysis: Secondary | ICD-10-CM | POA: Diagnosis not present

## 2024-01-01 DIAGNOSIS — N2581 Secondary hyperparathyroidism of renal origin: Secondary | ICD-10-CM | POA: Diagnosis not present

## 2024-01-05 DIAGNOSIS — I953 Hypotension of hemodialysis: Secondary | ICD-10-CM | POA: Diagnosis not present

## 2024-01-05 DIAGNOSIS — N2581 Secondary hyperparathyroidism of renal origin: Secondary | ICD-10-CM | POA: Diagnosis not present

## 2024-01-05 DIAGNOSIS — D631 Anemia in chronic kidney disease: Secondary | ICD-10-CM | POA: Diagnosis not present

## 2024-01-05 DIAGNOSIS — Z4931 Encounter for adequacy testing for hemodialysis: Secondary | ICD-10-CM | POA: Diagnosis not present

## 2024-01-05 DIAGNOSIS — N186 End stage renal disease: Secondary | ICD-10-CM | POA: Diagnosis not present

## 2024-01-05 DIAGNOSIS — Z992 Dependence on renal dialysis: Secondary | ICD-10-CM | POA: Diagnosis not present

## 2024-01-05 DIAGNOSIS — E1129 Type 2 diabetes mellitus with other diabetic kidney complication: Secondary | ICD-10-CM | POA: Diagnosis not present

## 2024-01-05 DIAGNOSIS — R252 Cramp and spasm: Secondary | ICD-10-CM | POA: Diagnosis not present

## 2024-01-06 ENCOUNTER — Encounter: Payer: Self-pay | Admitting: Internal Medicine

## 2024-01-07 ENCOUNTER — Encounter: Payer: Self-pay | Admitting: Podiatry

## 2024-01-07 ENCOUNTER — Ambulatory Visit (INDEPENDENT_AMBULATORY_CARE_PROVIDER_SITE_OTHER): Payer: Medicare Other | Admitting: Podiatry

## 2024-01-07 ENCOUNTER — Other Ambulatory Visit: Payer: Self-pay | Admitting: Podiatry

## 2024-01-07 DIAGNOSIS — Z992 Dependence on renal dialysis: Secondary | ICD-10-CM | POA: Diagnosis not present

## 2024-01-07 DIAGNOSIS — N2581 Secondary hyperparathyroidism of renal origin: Secondary | ICD-10-CM | POA: Diagnosis not present

## 2024-01-07 DIAGNOSIS — L97522 Non-pressure chronic ulcer of other part of left foot with fat layer exposed: Secondary | ICD-10-CM

## 2024-01-07 DIAGNOSIS — N186 End stage renal disease: Secondary | ICD-10-CM | POA: Diagnosis not present

## 2024-01-07 DIAGNOSIS — I953 Hypotension of hemodialysis: Secondary | ICD-10-CM | POA: Diagnosis not present

## 2024-01-07 DIAGNOSIS — Z4931 Encounter for adequacy testing for hemodialysis: Secondary | ICD-10-CM | POA: Diagnosis not present

## 2024-01-07 DIAGNOSIS — E1129 Type 2 diabetes mellitus with other diabetic kidney complication: Secondary | ICD-10-CM | POA: Diagnosis not present

## 2024-01-07 DIAGNOSIS — R252 Cramp and spasm: Secondary | ICD-10-CM | POA: Diagnosis not present

## 2024-01-07 DIAGNOSIS — D631 Anemia in chronic kidney disease: Secondary | ICD-10-CM | POA: Diagnosis not present

## 2024-01-07 DIAGNOSIS — L97502 Non-pressure chronic ulcer of other part of unspecified foot with fat layer exposed: Secondary | ICD-10-CM | POA: Diagnosis not present

## 2024-01-07 NOTE — Progress Notes (Signed)
  Subjective:  Patient ID: Duane Hall, male    DOB: Aug 04, 1956,  MRN: [id number]  Chief Complaint  Patient presents with   Diabetes    I am concerned about my second toe on my left foot.  Wife stated, His toenail fell off.    68 y.o. male presents with the above complaint. History confirmed with patient.  This started a couple months ago noticed bruising under the nail his nephrologist thought that perhaps there was an ingrown nail causing this  Objective:  Physical Exam: Left foot is warm and there is nonpalpable pulses he does have edema of the lower extremity there is a full-thickness necrotic patch ulceration of the distal tip of the left second toe his previous amputation site of the great toe has fully healed    Assessment:   1. Skin ulcer of toe of left foot with fat layer exposed (HCC)      Plan:  Patient was evaluated and treated and all questions answered.  Has new ulceration of the distal tip of the second toe I debrided the site of the necrotic patch today and took a culture of the underlying drainage does not appear to be extending to bone but does have quite a bit of depth to it hopefully should be able to heal with local wound care we will use gentamicin  or mupirocin  antibiotic ointment they will check and see if they still have any of his left or if they need a refill and notify me.  Expect this will take quite a long time to heal as his other wound did as well.  Culture of the drainage was taken to guide antibiotic therapy  He does have concerns about his chronic lower extremity edema which I discussed with him is multifactorial including his circulatory issues and PVD, fluid metabolism with his dialysis and chronic infections.  Doubtful that this will fully resolve although he does note that it improved quite a bit while he was on the antibiotics.  Return in about 3 weeks (around 01/28/2024) for wound care.

## 2024-01-08 DIAGNOSIS — Z4931 Encounter for adequacy testing for hemodialysis: Secondary | ICD-10-CM | POA: Diagnosis not present

## 2024-01-08 DIAGNOSIS — I953 Hypotension of hemodialysis: Secondary | ICD-10-CM | POA: Diagnosis not present

## 2024-01-08 DIAGNOSIS — Z992 Dependence on renal dialysis: Secondary | ICD-10-CM | POA: Diagnosis not present

## 2024-01-08 DIAGNOSIS — D631 Anemia in chronic kidney disease: Secondary | ICD-10-CM | POA: Diagnosis not present

## 2024-01-08 DIAGNOSIS — E1129 Type 2 diabetes mellitus with other diabetic kidney complication: Secondary | ICD-10-CM | POA: Diagnosis not present

## 2024-01-08 DIAGNOSIS — N2581 Secondary hyperparathyroidism of renal origin: Secondary | ICD-10-CM | POA: Diagnosis not present

## 2024-01-08 DIAGNOSIS — R252 Cramp and spasm: Secondary | ICD-10-CM | POA: Diagnosis not present

## 2024-01-08 DIAGNOSIS — N186 End stage renal disease: Secondary | ICD-10-CM | POA: Diagnosis not present

## 2024-01-09 LAB — WOUND CULTURE: Organism ID, Bacteria: NONE SEEN

## 2024-01-11 DIAGNOSIS — Z4931 Encounter for adequacy testing for hemodialysis: Secondary | ICD-10-CM | POA: Diagnosis not present

## 2024-01-11 DIAGNOSIS — N2581 Secondary hyperparathyroidism of renal origin: Secondary | ICD-10-CM | POA: Diagnosis not present

## 2024-01-11 DIAGNOSIS — R252 Cramp and spasm: Secondary | ICD-10-CM | POA: Diagnosis not present

## 2024-01-11 DIAGNOSIS — N186 End stage renal disease: Secondary | ICD-10-CM | POA: Diagnosis not present

## 2024-01-11 DIAGNOSIS — E1129 Type 2 diabetes mellitus with other diabetic kidney complication: Secondary | ICD-10-CM | POA: Diagnosis not present

## 2024-01-11 DIAGNOSIS — D631 Anemia in chronic kidney disease: Secondary | ICD-10-CM | POA: Diagnosis not present

## 2024-01-11 DIAGNOSIS — Z992 Dependence on renal dialysis: Secondary | ICD-10-CM | POA: Diagnosis not present

## 2024-01-11 DIAGNOSIS — I953 Hypotension of hemodialysis: Secondary | ICD-10-CM | POA: Diagnosis not present

## 2024-01-12 ENCOUNTER — Other Ambulatory Visit (HOSPITAL_COMMUNITY): Payer: Self-pay

## 2024-01-12 ENCOUNTER — Telehealth: Payer: Self-pay

## 2024-01-12 DIAGNOSIS — I953 Hypotension of hemodialysis: Secondary | ICD-10-CM | POA: Diagnosis not present

## 2024-01-12 DIAGNOSIS — E1129 Type 2 diabetes mellitus with other diabetic kidney complication: Secondary | ICD-10-CM | POA: Diagnosis not present

## 2024-01-12 DIAGNOSIS — D631 Anemia in chronic kidney disease: Secondary | ICD-10-CM | POA: Diagnosis not present

## 2024-01-12 DIAGNOSIS — Z4931 Encounter for adequacy testing for hemodialysis: Secondary | ICD-10-CM | POA: Diagnosis not present

## 2024-01-12 DIAGNOSIS — Z992 Dependence on renal dialysis: Secondary | ICD-10-CM | POA: Diagnosis not present

## 2024-01-12 DIAGNOSIS — N186 End stage renal disease: Secondary | ICD-10-CM | POA: Diagnosis not present

## 2024-01-12 DIAGNOSIS — R252 Cramp and spasm: Secondary | ICD-10-CM | POA: Diagnosis not present

## 2024-01-12 DIAGNOSIS — N2581 Secondary hyperparathyroidism of renal origin: Secondary | ICD-10-CM | POA: Diagnosis not present

## 2024-01-12 NOTE — Telephone Encounter (Signed)
 Pharmacy Patient Advocate Encounter   Received notification from Patient Advice Request messages that prior authorization for Dexcom G6 sensor is required/requested.   Insurance verification completed.   The patient is insured through [facility] .   Per test claim: The current 30 day co-pay is, $0.  No PA needed at this time. This test claim was processed through [facility]- copay amounts may vary at other pharmacies due to pharmacy/plan contracts, or as the patient moves through the different stages of their insurance plan.     I see in the messages that pt is requesting Dexcom G7 so I also ran a test claim for that, the copay was also $0. Claim states it is covered under part C

## 2024-01-12 NOTE — Telephone Encounter (Signed)
 Pharmacy Patient Advocate Encounter   Received notification from Patient Advice Request messages that prior authorization for Omnipods is required/requested.   Insurance verification completed.   The patient is insured through [facility] .   Per test claim: The current 30 day co-pay is, $420.  No PA needed at this time. This test claim was processed through [facility]- copay amounts may vary at other pharmacies due to pharmacy/plan contracts, or as the patient moves through the different stages of their insurance plan.     It looks like there is a deductible of $375

## 2024-01-13 ENCOUNTER — Other Ambulatory Visit: Payer: Self-pay

## 2024-01-13 DIAGNOSIS — R252 Cramp and spasm: Secondary | ICD-10-CM | POA: Diagnosis not present

## 2024-01-13 DIAGNOSIS — D631 Anemia in chronic kidney disease: Secondary | ICD-10-CM | POA: Diagnosis not present

## 2024-01-13 DIAGNOSIS — Z992 Dependence on renal dialysis: Secondary | ICD-10-CM | POA: Diagnosis not present

## 2024-01-13 DIAGNOSIS — I953 Hypotension of hemodialysis: Secondary | ICD-10-CM | POA: Diagnosis not present

## 2024-01-13 DIAGNOSIS — N186 End stage renal disease: Secondary | ICD-10-CM | POA: Diagnosis not present

## 2024-01-13 DIAGNOSIS — E1122 Type 2 diabetes mellitus with diabetic chronic kidney disease: Secondary | ICD-10-CM

## 2024-01-13 DIAGNOSIS — E1129 Type 2 diabetes mellitus with other diabetic kidney complication: Secondary | ICD-10-CM | POA: Diagnosis not present

## 2024-01-13 DIAGNOSIS — N2581 Secondary hyperparathyroidism of renal origin: Secondary | ICD-10-CM | POA: Diagnosis not present

## 2024-01-13 DIAGNOSIS — Z4931 Encounter for adequacy testing for hemodialysis: Secondary | ICD-10-CM | POA: Diagnosis not present

## 2024-01-13 MED ORDER — OMNIPOD 5 DEXG7G6 INTRO GEN 5 KIT: PACK | 0 refills | Status: DC

## 2024-01-13 MED ORDER — DEXCOM G7 SENSOR MISC: 1.0000 | 0 refills | Status: DC

## 2024-01-13 MED ORDER — OMNIPOD 5 DEXG7G6 PODS GEN 5 MISC: 1 refills | Status: DC

## 2024-01-13 NOTE — Telephone Encounter (Signed)
 Patient is wanting the G7 does another PA needed to be done

## 2024-01-14 DIAGNOSIS — R252 Cramp and spasm: Secondary | ICD-10-CM | POA: Diagnosis not present

## 2024-01-14 DIAGNOSIS — I953 Hypotension of hemodialysis: Secondary | ICD-10-CM | POA: Diagnosis not present

## 2024-01-14 DIAGNOSIS — N186 End stage renal disease: Secondary | ICD-10-CM | POA: Diagnosis not present

## 2024-01-14 DIAGNOSIS — D631 Anemia in chronic kidney disease: Secondary | ICD-10-CM | POA: Diagnosis not present

## 2024-01-14 DIAGNOSIS — Z4931 Encounter for adequacy testing for hemodialysis: Secondary | ICD-10-CM | POA: Diagnosis not present

## 2024-01-14 DIAGNOSIS — E1129 Type 2 diabetes mellitus with other diabetic kidney complication: Secondary | ICD-10-CM | POA: Diagnosis not present

## 2024-01-14 DIAGNOSIS — Z992 Dependence on renal dialysis: Secondary | ICD-10-CM | POA: Diagnosis not present

## 2024-01-14 DIAGNOSIS — N2581 Secondary hyperparathyroidism of renal origin: Secondary | ICD-10-CM | POA: Diagnosis not present

## 2024-01-15 DIAGNOSIS — E1129 Type 2 diabetes mellitus with other diabetic kidney complication: Secondary | ICD-10-CM | POA: Diagnosis not present

## 2024-01-15 DIAGNOSIS — R252 Cramp and spasm: Secondary | ICD-10-CM | POA: Diagnosis not present

## 2024-01-15 DIAGNOSIS — N2581 Secondary hyperparathyroidism of renal origin: Secondary | ICD-10-CM | POA: Diagnosis not present

## 2024-01-15 DIAGNOSIS — N186 End stage renal disease: Secondary | ICD-10-CM | POA: Diagnosis not present

## 2024-01-15 DIAGNOSIS — Z4931 Encounter for adequacy testing for hemodialysis: Secondary | ICD-10-CM | POA: Diagnosis not present

## 2024-01-15 DIAGNOSIS — I953 Hypotension of hemodialysis: Secondary | ICD-10-CM | POA: Diagnosis not present

## 2024-01-15 DIAGNOSIS — D631 Anemia in chronic kidney disease: Secondary | ICD-10-CM | POA: Diagnosis not present

## 2024-01-15 DIAGNOSIS — Z992 Dependence on renal dialysis: Secondary | ICD-10-CM | POA: Diagnosis not present

## 2024-01-18 DIAGNOSIS — D631 Anemia in chronic kidney disease: Secondary | ICD-10-CM | POA: Diagnosis not present

## 2024-01-18 DIAGNOSIS — E1129 Type 2 diabetes mellitus with other diabetic kidney complication: Secondary | ICD-10-CM | POA: Diagnosis not present

## 2024-01-18 DIAGNOSIS — R252 Cramp and spasm: Secondary | ICD-10-CM | POA: Diagnosis not present

## 2024-01-18 DIAGNOSIS — N2581 Secondary hyperparathyroidism of renal origin: Secondary | ICD-10-CM | POA: Diagnosis not present

## 2024-01-18 DIAGNOSIS — I953 Hypotension of hemodialysis: Secondary | ICD-10-CM | POA: Diagnosis not present

## 2024-01-18 DIAGNOSIS — Z4931 Encounter for adequacy testing for hemodialysis: Secondary | ICD-10-CM | POA: Diagnosis not present

## 2024-01-18 DIAGNOSIS — N186 End stage renal disease: Secondary | ICD-10-CM | POA: Diagnosis not present

## 2024-01-18 DIAGNOSIS — Z992 Dependence on renal dialysis: Secondary | ICD-10-CM | POA: Diagnosis not present

## 2024-01-19 DIAGNOSIS — Z992 Dependence on renal dialysis: Secondary | ICD-10-CM | POA: Diagnosis not present

## 2024-01-19 DIAGNOSIS — N186 End stage renal disease: Secondary | ICD-10-CM | POA: Diagnosis not present

## 2024-01-19 DIAGNOSIS — I953 Hypotension of hemodialysis: Secondary | ICD-10-CM | POA: Diagnosis not present

## 2024-01-19 DIAGNOSIS — R252 Cramp and spasm: Secondary | ICD-10-CM | POA: Diagnosis not present

## 2024-01-19 DIAGNOSIS — E1129 Type 2 diabetes mellitus with other diabetic kidney complication: Secondary | ICD-10-CM | POA: Diagnosis not present

## 2024-01-19 DIAGNOSIS — N2581 Secondary hyperparathyroidism of renal origin: Secondary | ICD-10-CM | POA: Diagnosis not present

## 2024-01-19 DIAGNOSIS — D631 Anemia in chronic kidney disease: Secondary | ICD-10-CM | POA: Diagnosis not present

## 2024-01-19 DIAGNOSIS — Z4931 Encounter for adequacy testing for hemodialysis: Secondary | ICD-10-CM | POA: Diagnosis not present

## 2024-01-21 DIAGNOSIS — E1129 Type 2 diabetes mellitus with other diabetic kidney complication: Secondary | ICD-10-CM | POA: Diagnosis not present

## 2024-01-21 DIAGNOSIS — R252 Cramp and spasm: Secondary | ICD-10-CM | POA: Diagnosis not present

## 2024-01-21 DIAGNOSIS — I953 Hypotension of hemodialysis: Secondary | ICD-10-CM | POA: Diagnosis not present

## 2024-01-21 DIAGNOSIS — Z4931 Encounter for adequacy testing for hemodialysis: Secondary | ICD-10-CM | POA: Diagnosis not present

## 2024-01-21 DIAGNOSIS — D631 Anemia in chronic kidney disease: Secondary | ICD-10-CM | POA: Diagnosis not present

## 2024-01-21 DIAGNOSIS — N2581 Secondary hyperparathyroidism of renal origin: Secondary | ICD-10-CM | POA: Diagnosis not present

## 2024-01-21 DIAGNOSIS — N186 End stage renal disease: Secondary | ICD-10-CM | POA: Diagnosis not present

## 2024-01-21 DIAGNOSIS — Z992 Dependence on renal dialysis: Secondary | ICD-10-CM | POA: Diagnosis not present

## 2024-01-22 DIAGNOSIS — R252 Cramp and spasm: Secondary | ICD-10-CM | POA: Diagnosis not present

## 2024-01-22 DIAGNOSIS — Z992 Dependence on renal dialysis: Secondary | ICD-10-CM | POA: Diagnosis not present

## 2024-01-22 DIAGNOSIS — N2581 Secondary hyperparathyroidism of renal origin: Secondary | ICD-10-CM | POA: Diagnosis not present

## 2024-01-22 DIAGNOSIS — I953 Hypotension of hemodialysis: Secondary | ICD-10-CM | POA: Diagnosis not present

## 2024-01-22 DIAGNOSIS — Z4931 Encounter for adequacy testing for hemodialysis: Secondary | ICD-10-CM | POA: Diagnosis not present

## 2024-01-22 DIAGNOSIS — N186 End stage renal disease: Secondary | ICD-10-CM | POA: Diagnosis not present

## 2024-01-22 DIAGNOSIS — E1129 Type 2 diabetes mellitus with other diabetic kidney complication: Secondary | ICD-10-CM | POA: Diagnosis not present

## 2024-01-22 DIAGNOSIS — D631 Anemia in chronic kidney disease: Secondary | ICD-10-CM | POA: Diagnosis not present

## 2024-01-25 DIAGNOSIS — Z992 Dependence on renal dialysis: Secondary | ICD-10-CM | POA: Diagnosis not present

## 2024-01-25 DIAGNOSIS — E1129 Type 2 diabetes mellitus with other diabetic kidney complication: Secondary | ICD-10-CM | POA: Diagnosis not present

## 2024-01-25 DIAGNOSIS — I953 Hypotension of hemodialysis: Secondary | ICD-10-CM | POA: Diagnosis not present

## 2024-01-25 DIAGNOSIS — Z4931 Encounter for adequacy testing for hemodialysis: Secondary | ICD-10-CM | POA: Diagnosis not present

## 2024-01-25 DIAGNOSIS — N186 End stage renal disease: Secondary | ICD-10-CM | POA: Diagnosis not present

## 2024-01-25 DIAGNOSIS — N2581 Secondary hyperparathyroidism of renal origin: Secondary | ICD-10-CM | POA: Diagnosis not present

## 2024-01-25 DIAGNOSIS — D631 Anemia in chronic kidney disease: Secondary | ICD-10-CM | POA: Diagnosis not present

## 2024-01-25 DIAGNOSIS — R252 Cramp and spasm: Secondary | ICD-10-CM | POA: Diagnosis not present

## 2024-01-26 DIAGNOSIS — E1129 Type 2 diabetes mellitus with other diabetic kidney complication: Secondary | ICD-10-CM | POA: Diagnosis not present

## 2024-01-26 DIAGNOSIS — I953 Hypotension of hemodialysis: Secondary | ICD-10-CM | POA: Diagnosis not present

## 2024-01-26 DIAGNOSIS — R252 Cramp and spasm: Secondary | ICD-10-CM | POA: Diagnosis not present

## 2024-01-26 DIAGNOSIS — N2581 Secondary hyperparathyroidism of renal origin: Secondary | ICD-10-CM | POA: Diagnosis not present

## 2024-01-26 DIAGNOSIS — D631 Anemia in chronic kidney disease: Secondary | ICD-10-CM | POA: Diagnosis not present

## 2024-01-26 DIAGNOSIS — Z4931 Encounter for adequacy testing for hemodialysis: Secondary | ICD-10-CM | POA: Diagnosis not present

## 2024-01-26 DIAGNOSIS — N186 End stage renal disease: Secondary | ICD-10-CM | POA: Diagnosis not present

## 2024-01-26 DIAGNOSIS — Z992 Dependence on renal dialysis: Secondary | ICD-10-CM | POA: Diagnosis not present

## 2024-01-27 ENCOUNTER — Encounter: Payer: Self-pay | Admitting: Podiatry

## 2024-01-27 ENCOUNTER — Ambulatory Visit: Payer: Medicare Other | Admitting: Podiatry

## 2024-01-27 DIAGNOSIS — L97522 Non-pressure chronic ulcer of other part of left foot with fat layer exposed: Secondary | ICD-10-CM

## 2024-01-28 DIAGNOSIS — N186 End stage renal disease: Secondary | ICD-10-CM | POA: Diagnosis not present

## 2024-01-28 DIAGNOSIS — I953 Hypotension of hemodialysis: Secondary | ICD-10-CM | POA: Diagnosis not present

## 2024-01-28 DIAGNOSIS — E1129 Type 2 diabetes mellitus with other diabetic kidney complication: Secondary | ICD-10-CM | POA: Diagnosis not present

## 2024-01-28 DIAGNOSIS — Z4931 Encounter for adequacy testing for hemodialysis: Secondary | ICD-10-CM | POA: Diagnosis not present

## 2024-01-28 DIAGNOSIS — N2581 Secondary hyperparathyroidism of renal origin: Secondary | ICD-10-CM | POA: Diagnosis not present

## 2024-01-28 DIAGNOSIS — D631 Anemia in chronic kidney disease: Secondary | ICD-10-CM | POA: Diagnosis not present

## 2024-01-28 DIAGNOSIS — R252 Cramp and spasm: Secondary | ICD-10-CM | POA: Diagnosis not present

## 2024-01-28 DIAGNOSIS — Z992 Dependence on renal dialysis: Secondary | ICD-10-CM | POA: Diagnosis not present

## 2024-01-29 DIAGNOSIS — R252 Cramp and spasm: Secondary | ICD-10-CM | POA: Diagnosis not present

## 2024-01-29 DIAGNOSIS — N2581 Secondary hyperparathyroidism of renal origin: Secondary | ICD-10-CM | POA: Diagnosis not present

## 2024-01-29 DIAGNOSIS — Z992 Dependence on renal dialysis: Secondary | ICD-10-CM | POA: Diagnosis not present

## 2024-01-29 DIAGNOSIS — E1129 Type 2 diabetes mellitus with other diabetic kidney complication: Secondary | ICD-10-CM | POA: Diagnosis not present

## 2024-01-29 DIAGNOSIS — Z4931 Encounter for adequacy testing for hemodialysis: Secondary | ICD-10-CM | POA: Diagnosis not present

## 2024-01-29 DIAGNOSIS — D631 Anemia in chronic kidney disease: Secondary | ICD-10-CM | POA: Diagnosis not present

## 2024-01-29 DIAGNOSIS — I953 Hypotension of hemodialysis: Secondary | ICD-10-CM | POA: Diagnosis not present

## 2024-01-29 DIAGNOSIS — N186 End stage renal disease: Secondary | ICD-10-CM | POA: Diagnosis not present

## 2024-01-29 NOTE — Progress Notes (Signed)
  Subjective:  Patient ID: Duane Hall, male    DOB: Jan 06, 1956,  MRN: [id number]  Chief Complaint  Patient presents with   Wound Check    Ulcer of left foot with necrosis of bone (HCC) Chronic osteomyelitis of left foot with draining sinus [facility]) Everything has been find since last visit    68 y.o. male presents with the above complaint. History confirmed with patient.    Objective:  Physical Exam: Left foot is warm and there is nonpalpable pulses he does have edema of the lower extremity full-thickness ulceration measuring 0.7 x 0.3 x 0.2 cm, no necrosis, fibrogranular wound bed.  Surrounding hyperkeratosis.     Culture with skin flora   Assessment:   1. Skin ulcer of toe of left foot with fat layer exposed (HCC)      Plan:  Patient was evaluated and treated and all questions answered.  Ulcer left second toe -We discussed the etiology and factors that are a part of the wound healing process.  We also discussed the risk of infection both soft tissue and osteomyelitis from open ulceration.  Discussed the risk of limb loss if this happens or worsens. -Debridement as below. -Dressed with Iodosorb, DSD. -Continue home dressing changes daily with 4 x 4 gauze and gentamicin -Seems to be improving -A1c is well-controlled -Has had maximum vascular intervention  Procedure: Excisional Debridement of Wound Rationale: Removal of non-viable soft tissue from the wound to promote healing.  Anesthesia: none Post-Debridement Wound Measurements: Noted above Type of Debridement: Sharp Excisional Tissue Removed: Non-viable soft tissue Depth of Debridement: subcutaneous tissue. Technique: Sharp excisional debridement to bleeding, viable wound base.  Dressing: Dry, sterile, compression dressing. Disposition: Patient tolerated procedure well.     No follow-ups on file.

## 2024-01-31 DIAGNOSIS — Z992 Dependence on renal dialysis: Secondary | ICD-10-CM | POA: Diagnosis not present

## 2024-01-31 DIAGNOSIS — N186 End stage renal disease: Secondary | ICD-10-CM | POA: Diagnosis not present

## 2024-01-31 DIAGNOSIS — E1129 Type 2 diabetes mellitus with other diabetic kidney complication: Secondary | ICD-10-CM | POA: Diagnosis not present

## 2024-02-01 DIAGNOSIS — R252 Cramp and spasm: Secondary | ICD-10-CM | POA: Diagnosis not present

## 2024-02-01 DIAGNOSIS — N2581 Secondary hyperparathyroidism of renal origin: Secondary | ICD-10-CM | POA: Diagnosis not present

## 2024-02-01 DIAGNOSIS — Z992 Dependence on renal dialysis: Secondary | ICD-10-CM | POA: Diagnosis not present

## 2024-02-01 DIAGNOSIS — I953 Hypotension of hemodialysis: Secondary | ICD-10-CM | POA: Diagnosis not present

## 2024-02-01 DIAGNOSIS — N186 End stage renal disease: Secondary | ICD-10-CM | POA: Diagnosis not present

## 2024-02-02 DIAGNOSIS — N186 End stage renal disease: Secondary | ICD-10-CM | POA: Diagnosis not present

## 2024-02-02 DIAGNOSIS — I953 Hypotension of hemodialysis: Secondary | ICD-10-CM | POA: Diagnosis not present

## 2024-02-02 DIAGNOSIS — Z992 Dependence on renal dialysis: Secondary | ICD-10-CM | POA: Diagnosis not present

## 2024-02-02 DIAGNOSIS — N2581 Secondary hyperparathyroidism of renal origin: Secondary | ICD-10-CM | POA: Diagnosis not present

## 2024-02-02 DIAGNOSIS — R252 Cramp and spasm: Secondary | ICD-10-CM | POA: Diagnosis not present

## 2024-02-03 NOTE — Telephone Encounter (Signed)
PA request has been Submitted. New Encounter created for follow up. For additional info see Pharmacy Prior Auth telephone encounter from 02/04.

## 2024-02-04 DIAGNOSIS — N186 End stage renal disease: Secondary | ICD-10-CM | POA: Diagnosis not present

## 2024-02-04 DIAGNOSIS — N2581 Secondary hyperparathyroidism of renal origin: Secondary | ICD-10-CM | POA: Diagnosis not present

## 2024-02-04 DIAGNOSIS — R252 Cramp and spasm: Secondary | ICD-10-CM | POA: Diagnosis not present

## 2024-02-04 DIAGNOSIS — Z992 Dependence on renal dialysis: Secondary | ICD-10-CM | POA: Diagnosis not present

## 2024-02-04 DIAGNOSIS — I953 Hypotension of hemodialysis: Secondary | ICD-10-CM | POA: Diagnosis not present

## 2024-02-05 DIAGNOSIS — N186 End stage renal disease: Secondary | ICD-10-CM | POA: Diagnosis not present

## 2024-02-05 DIAGNOSIS — N2581 Secondary hyperparathyroidism of renal origin: Secondary | ICD-10-CM | POA: Diagnosis not present

## 2024-02-05 DIAGNOSIS — R252 Cramp and spasm: Secondary | ICD-10-CM | POA: Diagnosis not present

## 2024-02-05 DIAGNOSIS — Z992 Dependence on renal dialysis: Secondary | ICD-10-CM | POA: Diagnosis not present

## 2024-02-05 DIAGNOSIS — I953 Hypotension of hemodialysis: Secondary | ICD-10-CM | POA: Diagnosis not present

## 2024-02-08 DIAGNOSIS — I953 Hypotension of hemodialysis: Secondary | ICD-10-CM | POA: Diagnosis not present

## 2024-02-08 DIAGNOSIS — Z992 Dependence on renal dialysis: Secondary | ICD-10-CM | POA: Diagnosis not present

## 2024-02-08 DIAGNOSIS — R252 Cramp and spasm: Secondary | ICD-10-CM | POA: Diagnosis not present

## 2024-02-08 DIAGNOSIS — N2581 Secondary hyperparathyroidism of renal origin: Secondary | ICD-10-CM | POA: Diagnosis not present

## 2024-02-08 DIAGNOSIS — N186 End stage renal disease: Secondary | ICD-10-CM | POA: Diagnosis not present

## 2024-02-09 DIAGNOSIS — Z992 Dependence on renal dialysis: Secondary | ICD-10-CM | POA: Diagnosis not present

## 2024-02-09 DIAGNOSIS — R252 Cramp and spasm: Secondary | ICD-10-CM | POA: Diagnosis not present

## 2024-02-09 DIAGNOSIS — N2581 Secondary hyperparathyroidism of renal origin: Secondary | ICD-10-CM | POA: Diagnosis not present

## 2024-02-09 DIAGNOSIS — I953 Hypotension of hemodialysis: Secondary | ICD-10-CM | POA: Diagnosis not present

## 2024-02-09 DIAGNOSIS — N186 End stage renal disease: Secondary | ICD-10-CM | POA: Diagnosis not present

## 2024-02-11 DIAGNOSIS — Z992 Dependence on renal dialysis: Secondary | ICD-10-CM | POA: Diagnosis not present

## 2024-02-11 DIAGNOSIS — N2581 Secondary hyperparathyroidism of renal origin: Secondary | ICD-10-CM | POA: Diagnosis not present

## 2024-02-11 DIAGNOSIS — N186 End stage renal disease: Secondary | ICD-10-CM | POA: Diagnosis not present

## 2024-02-11 DIAGNOSIS — R252 Cramp and spasm: Secondary | ICD-10-CM | POA: Diagnosis not present

## 2024-02-11 DIAGNOSIS — I953 Hypotension of hemodialysis: Secondary | ICD-10-CM | POA: Diagnosis not present

## 2024-02-12 DIAGNOSIS — N186 End stage renal disease: Secondary | ICD-10-CM | POA: Diagnosis not present

## 2024-02-12 DIAGNOSIS — N2581 Secondary hyperparathyroidism of renal origin: Secondary | ICD-10-CM | POA: Diagnosis not present

## 2024-02-12 DIAGNOSIS — R252 Cramp and spasm: Secondary | ICD-10-CM | POA: Diagnosis not present

## 2024-02-12 DIAGNOSIS — Z992 Dependence on renal dialysis: Secondary | ICD-10-CM | POA: Diagnosis not present

## 2024-02-12 DIAGNOSIS — I953 Hypotension of hemodialysis: Secondary | ICD-10-CM | POA: Diagnosis not present

## 2024-02-15 DIAGNOSIS — N2581 Secondary hyperparathyroidism of renal origin: Secondary | ICD-10-CM | POA: Diagnosis not present

## 2024-02-15 DIAGNOSIS — Z992 Dependence on renal dialysis: Secondary | ICD-10-CM | POA: Diagnosis not present

## 2024-02-15 DIAGNOSIS — N186 End stage renal disease: Secondary | ICD-10-CM | POA: Diagnosis not present

## 2024-02-15 DIAGNOSIS — I953 Hypotension of hemodialysis: Secondary | ICD-10-CM | POA: Diagnosis not present

## 2024-02-15 DIAGNOSIS — R252 Cramp and spasm: Secondary | ICD-10-CM | POA: Diagnosis not present

## 2024-02-17 ENCOUNTER — Ambulatory Visit: Payer: Medicare Other | Admitting: Podiatry

## 2024-02-18 DIAGNOSIS — Z992 Dependence on renal dialysis: Secondary | ICD-10-CM | POA: Diagnosis not present

## 2024-02-18 DIAGNOSIS — R252 Cramp and spasm: Secondary | ICD-10-CM | POA: Diagnosis not present

## 2024-02-18 DIAGNOSIS — I953 Hypotension of hemodialysis: Secondary | ICD-10-CM | POA: Diagnosis not present

## 2024-02-18 DIAGNOSIS — N186 End stage renal disease: Secondary | ICD-10-CM | POA: Diagnosis not present

## 2024-02-18 DIAGNOSIS — N2581 Secondary hyperparathyroidism of renal origin: Secondary | ICD-10-CM | POA: Diagnosis not present

## 2024-02-19 DIAGNOSIS — Z992 Dependence on renal dialysis: Secondary | ICD-10-CM | POA: Diagnosis not present

## 2024-02-19 DIAGNOSIS — N186 End stage renal disease: Secondary | ICD-10-CM | POA: Diagnosis not present

## 2024-02-19 DIAGNOSIS — N2581 Secondary hyperparathyroidism of renal origin: Secondary | ICD-10-CM | POA: Diagnosis not present

## 2024-02-19 DIAGNOSIS — I953 Hypotension of hemodialysis: Secondary | ICD-10-CM | POA: Diagnosis not present

## 2024-02-19 DIAGNOSIS — R252 Cramp and spasm: Secondary | ICD-10-CM | POA: Diagnosis not present

## 2024-02-22 DIAGNOSIS — Z992 Dependence on renal dialysis: Secondary | ICD-10-CM | POA: Diagnosis not present

## 2024-02-22 DIAGNOSIS — N2581 Secondary hyperparathyroidism of renal origin: Secondary | ICD-10-CM | POA: Diagnosis not present

## 2024-02-22 DIAGNOSIS — N186 End stage renal disease: Secondary | ICD-10-CM | POA: Diagnosis not present

## 2024-02-22 DIAGNOSIS — I953 Hypotension of hemodialysis: Secondary | ICD-10-CM | POA: Diagnosis not present

## 2024-02-22 DIAGNOSIS — R252 Cramp and spasm: Secondary | ICD-10-CM | POA: Diagnosis not present

## 2024-02-23 DIAGNOSIS — N186 End stage renal disease: Secondary | ICD-10-CM | POA: Diagnosis not present

## 2024-02-23 DIAGNOSIS — R252 Cramp and spasm: Secondary | ICD-10-CM | POA: Diagnosis not present

## 2024-02-23 DIAGNOSIS — N2581 Secondary hyperparathyroidism of renal origin: Secondary | ICD-10-CM | POA: Diagnosis not present

## 2024-02-23 DIAGNOSIS — Z992 Dependence on renal dialysis: Secondary | ICD-10-CM | POA: Diagnosis not present

## 2024-02-23 DIAGNOSIS — I953 Hypotension of hemodialysis: Secondary | ICD-10-CM | POA: Diagnosis not present

## 2024-02-24 ENCOUNTER — Ambulatory Visit: Payer: Medicare Other | Admitting: Podiatry

## 2024-02-24 ENCOUNTER — Encounter: Payer: Self-pay | Admitting: Podiatry

## 2024-02-24 DIAGNOSIS — L97522 Non-pressure chronic ulcer of other part of left foot with fat layer exposed: Secondary | ICD-10-CM | POA: Diagnosis not present

## 2024-02-24 NOTE — Progress Notes (Signed)
  Subjective:  Patient ID: Duane Hall, male    DOB: December 13, 1956,  MRN: [id number]  Chief Complaint  Patient presents with   Wound Check    Patient states everything has been fine and there has been no drainage, nor pain     68 y.o. male presents with the above complaint. History confirmed with patient.    Objective:  Physical Exam: Left foot is warm and there is nonpalpable pulses he does have edema of the lower extremity full-thickness ulceration measuring 0.7 x 0.3 x 0.2 cm, no necrosis, fibrogranular wound bed.  Surrounding hyperkeratosis.     Culture with skin flora   Assessment:   1. Skin ulcer of toe of left foot with fat layer exposed (HCC)      Plan:  Patient was evaluated and treated and all questions answered.  Ulcer left second toe -No major changes in dimensions today but skin edges appear to be healing and there was not significant necrosis or nonviable tissue that required debridement today.  Continue using gentamicin ointment at home.  Would like him to leave open to air overnight and bandage during the day.  Return in 4 weeks to reevaluate.    Return in about 4 weeks (around 03/23/2024) for wound care.

## 2024-02-25 DIAGNOSIS — N2581 Secondary hyperparathyroidism of renal origin: Secondary | ICD-10-CM | POA: Diagnosis not present

## 2024-02-25 DIAGNOSIS — I953 Hypotension of hemodialysis: Secondary | ICD-10-CM | POA: Diagnosis not present

## 2024-02-25 DIAGNOSIS — Z992 Dependence on renal dialysis: Secondary | ICD-10-CM | POA: Diagnosis not present

## 2024-02-25 DIAGNOSIS — R252 Cramp and spasm: Secondary | ICD-10-CM | POA: Diagnosis not present

## 2024-02-25 DIAGNOSIS — N186 End stage renal disease: Secondary | ICD-10-CM | POA: Diagnosis not present

## 2024-02-26 DIAGNOSIS — N186 End stage renal disease: Secondary | ICD-10-CM | POA: Diagnosis not present

## 2024-02-26 DIAGNOSIS — Z992 Dependence on renal dialysis: Secondary | ICD-10-CM | POA: Diagnosis not present

## 2024-02-26 DIAGNOSIS — R252 Cramp and spasm: Secondary | ICD-10-CM | POA: Diagnosis not present

## 2024-02-26 DIAGNOSIS — N2581 Secondary hyperparathyroidism of renal origin: Secondary | ICD-10-CM | POA: Diagnosis not present

## 2024-02-26 DIAGNOSIS — I953 Hypotension of hemodialysis: Secondary | ICD-10-CM | POA: Diagnosis not present

## 2024-02-28 DIAGNOSIS — E1129 Type 2 diabetes mellitus with other diabetic kidney complication: Secondary | ICD-10-CM | POA: Diagnosis not present

## 2024-02-28 DIAGNOSIS — Z992 Dependence on renal dialysis: Secondary | ICD-10-CM | POA: Diagnosis not present

## 2024-02-28 DIAGNOSIS — N186 End stage renal disease: Secondary | ICD-10-CM | POA: Diagnosis not present

## 2024-02-29 DIAGNOSIS — N2581 Secondary hyperparathyroidism of renal origin: Secondary | ICD-10-CM | POA: Diagnosis not present

## 2024-02-29 DIAGNOSIS — N186 End stage renal disease: Secondary | ICD-10-CM | POA: Diagnosis not present

## 2024-02-29 DIAGNOSIS — I953 Hypotension of hemodialysis: Secondary | ICD-10-CM | POA: Diagnosis not present

## 2024-02-29 DIAGNOSIS — Z992 Dependence on renal dialysis: Secondary | ICD-10-CM | POA: Diagnosis not present

## 2024-02-29 DIAGNOSIS — R252 Cramp and spasm: Secondary | ICD-10-CM | POA: Diagnosis not present

## 2024-03-01 DIAGNOSIS — Z992 Dependence on renal dialysis: Secondary | ICD-10-CM | POA: Diagnosis not present

## 2024-03-01 DIAGNOSIS — R252 Cramp and spasm: Secondary | ICD-10-CM | POA: Diagnosis not present

## 2024-03-01 DIAGNOSIS — I953 Hypotension of hemodialysis: Secondary | ICD-10-CM | POA: Diagnosis not present

## 2024-03-01 DIAGNOSIS — N186 End stage renal disease: Secondary | ICD-10-CM | POA: Diagnosis not present

## 2024-03-01 DIAGNOSIS — N2581 Secondary hyperparathyroidism of renal origin: Secondary | ICD-10-CM | POA: Diagnosis not present

## 2024-03-03 DIAGNOSIS — I953 Hypotension of hemodialysis: Secondary | ICD-10-CM | POA: Diagnosis not present

## 2024-03-03 DIAGNOSIS — N186 End stage renal disease: Secondary | ICD-10-CM | POA: Diagnosis not present

## 2024-03-03 DIAGNOSIS — N2581 Secondary hyperparathyroidism of renal origin: Secondary | ICD-10-CM | POA: Diagnosis not present

## 2024-03-03 DIAGNOSIS — R252 Cramp and spasm: Secondary | ICD-10-CM | POA: Diagnosis not present

## 2024-03-03 DIAGNOSIS — Z992 Dependence on renal dialysis: Secondary | ICD-10-CM | POA: Diagnosis not present

## 2024-03-04 ENCOUNTER — Telehealth: Payer: Self-pay

## 2024-03-04 DIAGNOSIS — Z992 Dependence on renal dialysis: Secondary | ICD-10-CM | POA: Diagnosis not present

## 2024-03-04 DIAGNOSIS — N186 End stage renal disease: Secondary | ICD-10-CM | POA: Diagnosis not present

## 2024-03-04 DIAGNOSIS — I953 Hypotension of hemodialysis: Secondary | ICD-10-CM | POA: Diagnosis not present

## 2024-03-04 DIAGNOSIS — N2581 Secondary hyperparathyroidism of renal origin: Secondary | ICD-10-CM | POA: Diagnosis not present

## 2024-03-04 DIAGNOSIS — R252 Cramp and spasm: Secondary | ICD-10-CM | POA: Diagnosis not present

## 2024-03-04 NOTE — Telephone Encounter (Signed)
 Patient called back and said that his insurance told him that a Prior Auth is required and that they have been trying to send information over but have not heard anything back. Her said they faxed something today as well. Patient asked for a call back from the Prior Auth team today, he would like a call back [phone]

## 2024-03-04 NOTE — Telephone Encounter (Signed)
 Pharmacy Patient Advocate Encounter   Received notification from  [employer]  that prior authorization for Omnipod is required/requested.   Insurance verification completed.   The patient is insured through [facility] .   Per test claim: PA required; PA submitted to above mentioned insurance via Fax Key/confirmation #/EOC [id number] Status is pending

## 2024-03-05 NOTE — Telephone Encounter (Signed)
 Pharmacy Patient Advocate Encounter  Received notification from [facility] that Prior Authorization for Omnipod has been CANCELLED due to this request has already been reviewed, and the medication was denied for coverage on 02-27-2024. A new coverage determination cannot be requested until day 66 following the previous denial decision; you also have the option to appeal the decision.   PA #/Case ID/Reference/Fax #: [phone]

## 2024-03-07 DIAGNOSIS — N2581 Secondary hyperparathyroidism of renal origin: Secondary | ICD-10-CM | POA: Diagnosis not present

## 2024-03-07 DIAGNOSIS — I953 Hypotension of hemodialysis: Secondary | ICD-10-CM | POA: Diagnosis not present

## 2024-03-07 DIAGNOSIS — R252 Cramp and spasm: Secondary | ICD-10-CM | POA: Diagnosis not present

## 2024-03-07 DIAGNOSIS — N186 End stage renal disease: Secondary | ICD-10-CM | POA: Diagnosis not present

## 2024-03-07 DIAGNOSIS — Z992 Dependence on renal dialysis: Secondary | ICD-10-CM | POA: Diagnosis not present

## 2024-03-08 ENCOUNTER — Telehealth: Payer: Self-pay | Admitting: Pharmacist

## 2024-03-08 DIAGNOSIS — N2581 Secondary hyperparathyroidism of renal origin: Secondary | ICD-10-CM | POA: Diagnosis not present

## 2024-03-08 DIAGNOSIS — Z992 Dependence on renal dialysis: Secondary | ICD-10-CM | POA: Diagnosis not present

## 2024-03-08 DIAGNOSIS — R252 Cramp and spasm: Secondary | ICD-10-CM | POA: Diagnosis not present

## 2024-03-08 DIAGNOSIS — N186 End stage renal disease: Secondary | ICD-10-CM | POA: Diagnosis not present

## 2024-03-08 DIAGNOSIS — I953 Hypotension of hemodialysis: Secondary | ICD-10-CM | POA: Diagnosis not present

## 2024-03-08 NOTE — Telephone Encounter (Signed)
 Patient should be covred as he has diabetes and receive insulin continuously through pump and uses CGM.  Patient state it was denied due to notes not being provided. We need to have this re done or appeal. Asap

## 2024-03-08 NOTE — Telephone Encounter (Signed)
 Appeal has been submitted for Omnipod. Will advise when response is received, please be advised that most companies may take 30 days to make a decision. Appeal letter and supporting documentation have been faxed to [phone] on 03/08/2024 @3 :47pm.  Thank you, Dellie Burns, PharmD Clinical Pharmacist  [place]  Direct Dial: [phone]

## 2024-03-11 DIAGNOSIS — I953 Hypotension of hemodialysis: Secondary | ICD-10-CM | POA: Diagnosis not present

## 2024-03-11 DIAGNOSIS — N186 End stage renal disease: Secondary | ICD-10-CM | POA: Diagnosis not present

## 2024-03-11 DIAGNOSIS — Z992 Dependence on renal dialysis: Secondary | ICD-10-CM | POA: Diagnosis not present

## 2024-03-11 DIAGNOSIS — N2581 Secondary hyperparathyroidism of renal origin: Secondary | ICD-10-CM | POA: Diagnosis not present

## 2024-03-11 DIAGNOSIS — R252 Cramp and spasm: Secondary | ICD-10-CM | POA: Diagnosis not present

## 2024-03-14 DIAGNOSIS — N186 End stage renal disease: Secondary | ICD-10-CM | POA: Diagnosis not present

## 2024-03-14 DIAGNOSIS — N2581 Secondary hyperparathyroidism of renal origin: Secondary | ICD-10-CM | POA: Diagnosis not present

## 2024-03-14 DIAGNOSIS — R252 Cramp and spasm: Secondary | ICD-10-CM | POA: Diagnosis not present

## 2024-03-14 DIAGNOSIS — Z992 Dependence on renal dialysis: Secondary | ICD-10-CM | POA: Diagnosis not present

## 2024-03-14 DIAGNOSIS — I953 Hypotension of hemodialysis: Secondary | ICD-10-CM | POA: Diagnosis not present

## 2024-03-17 DIAGNOSIS — N2581 Secondary hyperparathyroidism of renal origin: Secondary | ICD-10-CM | POA: Diagnosis not present

## 2024-03-17 DIAGNOSIS — R252 Cramp and spasm: Secondary | ICD-10-CM | POA: Diagnosis not present

## 2024-03-17 DIAGNOSIS — Z992 Dependence on renal dialysis: Secondary | ICD-10-CM | POA: Diagnosis not present

## 2024-03-17 DIAGNOSIS — N186 End stage renal disease: Secondary | ICD-10-CM | POA: Diagnosis not present

## 2024-03-17 DIAGNOSIS — I953 Hypotension of hemodialysis: Secondary | ICD-10-CM | POA: Diagnosis not present

## 2024-03-18 DIAGNOSIS — I953 Hypotension of hemodialysis: Secondary | ICD-10-CM | POA: Diagnosis not present

## 2024-03-18 DIAGNOSIS — R252 Cramp and spasm: Secondary | ICD-10-CM | POA: Diagnosis not present

## 2024-03-18 DIAGNOSIS — Z992 Dependence on renal dialysis: Secondary | ICD-10-CM | POA: Diagnosis not present

## 2024-03-18 DIAGNOSIS — N2581 Secondary hyperparathyroidism of renal origin: Secondary | ICD-10-CM | POA: Diagnosis not present

## 2024-03-18 DIAGNOSIS — N186 End stage renal disease: Secondary | ICD-10-CM | POA: Diagnosis not present

## 2024-03-19 ENCOUNTER — Other Ambulatory Visit: Payer: Self-pay

## 2024-03-21 DIAGNOSIS — N2581 Secondary hyperparathyroidism of renal origin: Secondary | ICD-10-CM | POA: Diagnosis not present

## 2024-03-21 DIAGNOSIS — N186 End stage renal disease: Secondary | ICD-10-CM | POA: Diagnosis not present

## 2024-03-21 DIAGNOSIS — I953 Hypotension of hemodialysis: Secondary | ICD-10-CM | POA: Diagnosis not present

## 2024-03-21 DIAGNOSIS — R252 Cramp and spasm: Secondary | ICD-10-CM | POA: Diagnosis not present

## 2024-03-21 DIAGNOSIS — Z992 Dependence on renal dialysis: Secondary | ICD-10-CM | POA: Diagnosis not present

## 2024-03-22 DIAGNOSIS — I953 Hypotension of hemodialysis: Secondary | ICD-10-CM | POA: Diagnosis not present

## 2024-03-22 DIAGNOSIS — Z992 Dependence on renal dialysis: Secondary | ICD-10-CM | POA: Diagnosis not present

## 2024-03-22 DIAGNOSIS — N2581 Secondary hyperparathyroidism of renal origin: Secondary | ICD-10-CM | POA: Diagnosis not present

## 2024-03-22 DIAGNOSIS — N186 End stage renal disease: Secondary | ICD-10-CM | POA: Diagnosis not present

## 2024-03-22 DIAGNOSIS — R252 Cramp and spasm: Secondary | ICD-10-CM | POA: Diagnosis not present

## 2024-03-23 ENCOUNTER — Ambulatory Visit: Payer: Medicare Other | Admitting: Podiatry

## 2024-03-25 DIAGNOSIS — R252 Cramp and spasm: Secondary | ICD-10-CM | POA: Diagnosis not present

## 2024-03-25 DIAGNOSIS — I953 Hypotension of hemodialysis: Secondary | ICD-10-CM | POA: Diagnosis not present

## 2024-03-25 DIAGNOSIS — N2581 Secondary hyperparathyroidism of renal origin: Secondary | ICD-10-CM | POA: Diagnosis not present

## 2024-03-25 DIAGNOSIS — Z992 Dependence on renal dialysis: Secondary | ICD-10-CM | POA: Diagnosis not present

## 2024-03-25 DIAGNOSIS — N186 End stage renal disease: Secondary | ICD-10-CM | POA: Diagnosis not present

## 2024-03-26 NOTE — Telephone Encounter (Signed)
 Done

## 2024-03-29 DIAGNOSIS — R252 Cramp and spasm: Secondary | ICD-10-CM | POA: Diagnosis not present

## 2024-03-29 DIAGNOSIS — Z992 Dependence on renal dialysis: Secondary | ICD-10-CM | POA: Diagnosis not present

## 2024-03-29 DIAGNOSIS — N2581 Secondary hyperparathyroidism of renal origin: Secondary | ICD-10-CM | POA: Diagnosis not present

## 2024-03-29 DIAGNOSIS — I953 Hypotension of hemodialysis: Secondary | ICD-10-CM | POA: Diagnosis not present

## 2024-03-29 DIAGNOSIS — N186 End stage renal disease: Secondary | ICD-10-CM | POA: Diagnosis not present

## 2024-03-30 DIAGNOSIS — N186 End stage renal disease: Secondary | ICD-10-CM | POA: Diagnosis not present

## 2024-03-30 DIAGNOSIS — E1129 Type 2 diabetes mellitus with other diabetic kidney complication: Secondary | ICD-10-CM | POA: Diagnosis not present

## 2024-03-30 DIAGNOSIS — Z992 Dependence on renal dialysis: Secondary | ICD-10-CM | POA: Diagnosis not present

## 2024-03-30 NOTE — Telephone Encounter (Signed)
 Insurance approved the appeal:

## 2024-03-31 DIAGNOSIS — Z992 Dependence on renal dialysis: Secondary | ICD-10-CM | POA: Diagnosis not present

## 2024-03-31 DIAGNOSIS — D631 Anemia in chronic kidney disease: Secondary | ICD-10-CM | POA: Diagnosis not present

## 2024-03-31 DIAGNOSIS — E1129 Type 2 diabetes mellitus with other diabetic kidney complication: Secondary | ICD-10-CM | POA: Diagnosis not present

## 2024-03-31 DIAGNOSIS — N186 End stage renal disease: Secondary | ICD-10-CM | POA: Diagnosis not present

## 2024-03-31 DIAGNOSIS — N2581 Secondary hyperparathyroidism of renal origin: Secondary | ICD-10-CM | POA: Diagnosis not present

## 2024-03-31 DIAGNOSIS — I953 Hypotension of hemodialysis: Secondary | ICD-10-CM | POA: Diagnosis not present

## 2024-03-31 DIAGNOSIS — R252 Cramp and spasm: Secondary | ICD-10-CM | POA: Diagnosis not present

## 2024-03-31 DIAGNOSIS — Z4931 Encounter for adequacy testing for hemodialysis: Secondary | ICD-10-CM | POA: Diagnosis not present

## 2024-04-04 DIAGNOSIS — R252 Cramp and spasm: Secondary | ICD-10-CM | POA: Diagnosis not present

## 2024-04-04 DIAGNOSIS — E1129 Type 2 diabetes mellitus with other diabetic kidney complication: Secondary | ICD-10-CM | POA: Diagnosis not present

## 2024-04-04 DIAGNOSIS — I953 Hypotension of hemodialysis: Secondary | ICD-10-CM | POA: Diagnosis not present

## 2024-04-04 DIAGNOSIS — N186 End stage renal disease: Secondary | ICD-10-CM | POA: Diagnosis not present

## 2024-04-04 DIAGNOSIS — N2581 Secondary hyperparathyroidism of renal origin: Secondary | ICD-10-CM | POA: Diagnosis not present

## 2024-04-04 DIAGNOSIS — Z992 Dependence on renal dialysis: Secondary | ICD-10-CM | POA: Diagnosis not present

## 2024-04-04 DIAGNOSIS — Z4931 Encounter for adequacy testing for hemodialysis: Secondary | ICD-10-CM | POA: Diagnosis not present

## 2024-04-04 DIAGNOSIS — D631 Anemia in chronic kidney disease: Secondary | ICD-10-CM | POA: Diagnosis not present

## 2024-04-06 DIAGNOSIS — Z4931 Encounter for adequacy testing for hemodialysis: Secondary | ICD-10-CM | POA: Diagnosis not present

## 2024-04-06 DIAGNOSIS — N2581 Secondary hyperparathyroidism of renal origin: Secondary | ICD-10-CM | POA: Diagnosis not present

## 2024-04-06 DIAGNOSIS — N186 End stage renal disease: Secondary | ICD-10-CM | POA: Diagnosis not present

## 2024-04-06 DIAGNOSIS — Z992 Dependence on renal dialysis: Secondary | ICD-10-CM | POA: Diagnosis not present

## 2024-04-06 DIAGNOSIS — D631 Anemia in chronic kidney disease: Secondary | ICD-10-CM | POA: Diagnosis not present

## 2024-04-06 DIAGNOSIS — E1129 Type 2 diabetes mellitus with other diabetic kidney complication: Secondary | ICD-10-CM | POA: Diagnosis not present

## 2024-04-06 DIAGNOSIS — R252 Cramp and spasm: Secondary | ICD-10-CM | POA: Diagnosis not present

## 2024-04-06 DIAGNOSIS — I953 Hypotension of hemodialysis: Secondary | ICD-10-CM | POA: Diagnosis not present

## 2024-04-07 DIAGNOSIS — D631 Anemia in chronic kidney disease: Secondary | ICD-10-CM | POA: Diagnosis not present

## 2024-04-07 DIAGNOSIS — Z4931 Encounter for adequacy testing for hemodialysis: Secondary | ICD-10-CM | POA: Diagnosis not present

## 2024-04-07 DIAGNOSIS — Z992 Dependence on renal dialysis: Secondary | ICD-10-CM | POA: Diagnosis not present

## 2024-04-07 DIAGNOSIS — R252 Cramp and spasm: Secondary | ICD-10-CM | POA: Diagnosis not present

## 2024-04-07 DIAGNOSIS — E1129 Type 2 diabetes mellitus with other diabetic kidney complication: Secondary | ICD-10-CM | POA: Diagnosis not present

## 2024-04-07 DIAGNOSIS — I953 Hypotension of hemodialysis: Secondary | ICD-10-CM | POA: Diagnosis not present

## 2024-04-07 DIAGNOSIS — N2581 Secondary hyperparathyroidism of renal origin: Secondary | ICD-10-CM | POA: Diagnosis not present

## 2024-04-07 DIAGNOSIS — N186 End stage renal disease: Secondary | ICD-10-CM | POA: Diagnosis not present

## 2024-04-08 DIAGNOSIS — N2581 Secondary hyperparathyroidism of renal origin: Secondary | ICD-10-CM | POA: Diagnosis not present

## 2024-04-08 DIAGNOSIS — Z992 Dependence on renal dialysis: Secondary | ICD-10-CM | POA: Diagnosis not present

## 2024-04-08 DIAGNOSIS — E1129 Type 2 diabetes mellitus with other diabetic kidney complication: Secondary | ICD-10-CM | POA: Diagnosis not present

## 2024-04-08 DIAGNOSIS — D631 Anemia in chronic kidney disease: Secondary | ICD-10-CM | POA: Diagnosis not present

## 2024-04-08 DIAGNOSIS — Z4931 Encounter for adequacy testing for hemodialysis: Secondary | ICD-10-CM | POA: Diagnosis not present

## 2024-04-08 DIAGNOSIS — I953 Hypotension of hemodialysis: Secondary | ICD-10-CM | POA: Diagnosis not present

## 2024-04-08 DIAGNOSIS — R252 Cramp and spasm: Secondary | ICD-10-CM | POA: Diagnosis not present

## 2024-04-08 DIAGNOSIS — N186 End stage renal disease: Secondary | ICD-10-CM | POA: Diagnosis not present

## 2024-04-09 DIAGNOSIS — N186 End stage renal disease: Secondary | ICD-10-CM | POA: Diagnosis not present

## 2024-04-09 DIAGNOSIS — E1129 Type 2 diabetes mellitus with other diabetic kidney complication: Secondary | ICD-10-CM | POA: Diagnosis not present

## 2024-04-09 DIAGNOSIS — R252 Cramp and spasm: Secondary | ICD-10-CM | POA: Diagnosis not present

## 2024-04-09 DIAGNOSIS — D631 Anemia in chronic kidney disease: Secondary | ICD-10-CM | POA: Diagnosis not present

## 2024-04-09 DIAGNOSIS — N2581 Secondary hyperparathyroidism of renal origin: Secondary | ICD-10-CM | POA: Diagnosis not present

## 2024-04-09 DIAGNOSIS — Z992 Dependence on renal dialysis: Secondary | ICD-10-CM | POA: Diagnosis not present

## 2024-04-09 DIAGNOSIS — Z4931 Encounter for adequacy testing for hemodialysis: Secondary | ICD-10-CM | POA: Diagnosis not present

## 2024-04-09 DIAGNOSIS — I953 Hypotension of hemodialysis: Secondary | ICD-10-CM | POA: Diagnosis not present

## 2024-04-11 DIAGNOSIS — R252 Cramp and spasm: Secondary | ICD-10-CM | POA: Diagnosis not present

## 2024-04-11 DIAGNOSIS — Z4931 Encounter for adequacy testing for hemodialysis: Secondary | ICD-10-CM | POA: Diagnosis not present

## 2024-04-11 DIAGNOSIS — D631 Anemia in chronic kidney disease: Secondary | ICD-10-CM | POA: Diagnosis not present

## 2024-04-11 DIAGNOSIS — E1129 Type 2 diabetes mellitus with other diabetic kidney complication: Secondary | ICD-10-CM | POA: Diagnosis not present

## 2024-04-11 DIAGNOSIS — N2581 Secondary hyperparathyroidism of renal origin: Secondary | ICD-10-CM | POA: Diagnosis not present

## 2024-04-11 DIAGNOSIS — I953 Hypotension of hemodialysis: Secondary | ICD-10-CM | POA: Diagnosis not present

## 2024-04-11 DIAGNOSIS — Z992 Dependence on renal dialysis: Secondary | ICD-10-CM | POA: Diagnosis not present

## 2024-04-11 DIAGNOSIS — N186 End stage renal disease: Secondary | ICD-10-CM | POA: Diagnosis not present

## 2024-04-12 DIAGNOSIS — I953 Hypotension of hemodialysis: Secondary | ICD-10-CM | POA: Diagnosis not present

## 2024-04-12 DIAGNOSIS — N2581 Secondary hyperparathyroidism of renal origin: Secondary | ICD-10-CM | POA: Diagnosis not present

## 2024-04-12 DIAGNOSIS — D631 Anemia in chronic kidney disease: Secondary | ICD-10-CM | POA: Diagnosis not present

## 2024-04-12 DIAGNOSIS — Z4931 Encounter for adequacy testing for hemodialysis: Secondary | ICD-10-CM | POA: Diagnosis not present

## 2024-04-12 DIAGNOSIS — E1129 Type 2 diabetes mellitus with other diabetic kidney complication: Secondary | ICD-10-CM | POA: Diagnosis not present

## 2024-04-12 DIAGNOSIS — R252 Cramp and spasm: Secondary | ICD-10-CM | POA: Diagnosis not present

## 2024-04-12 DIAGNOSIS — Z992 Dependence on renal dialysis: Secondary | ICD-10-CM | POA: Diagnosis not present

## 2024-04-12 DIAGNOSIS — N186 End stage renal disease: Secondary | ICD-10-CM | POA: Diagnosis not present

## 2024-04-14 DIAGNOSIS — Z4931 Encounter for adequacy testing for hemodialysis: Secondary | ICD-10-CM | POA: Diagnosis not present

## 2024-04-14 DIAGNOSIS — E1129 Type 2 diabetes mellitus with other diabetic kidney complication: Secondary | ICD-10-CM | POA: Diagnosis not present

## 2024-04-14 DIAGNOSIS — R252 Cramp and spasm: Secondary | ICD-10-CM | POA: Diagnosis not present

## 2024-04-14 DIAGNOSIS — N2581 Secondary hyperparathyroidism of renal origin: Secondary | ICD-10-CM | POA: Diagnosis not present

## 2024-04-14 DIAGNOSIS — Z992 Dependence on renal dialysis: Secondary | ICD-10-CM | POA: Diagnosis not present

## 2024-04-14 DIAGNOSIS — D631 Anemia in chronic kidney disease: Secondary | ICD-10-CM | POA: Diagnosis not present

## 2024-04-14 DIAGNOSIS — N186 End stage renal disease: Secondary | ICD-10-CM | POA: Diagnosis not present

## 2024-04-14 DIAGNOSIS — I953 Hypotension of hemodialysis: Secondary | ICD-10-CM | POA: Diagnosis not present

## 2024-04-15 DIAGNOSIS — Z4931 Encounter for adequacy testing for hemodialysis: Secondary | ICD-10-CM | POA: Diagnosis not present

## 2024-04-15 DIAGNOSIS — E1129 Type 2 diabetes mellitus with other diabetic kidney complication: Secondary | ICD-10-CM | POA: Diagnosis not present

## 2024-04-15 DIAGNOSIS — N186 End stage renal disease: Secondary | ICD-10-CM | POA: Diagnosis not present

## 2024-04-15 DIAGNOSIS — Z992 Dependence on renal dialysis: Secondary | ICD-10-CM | POA: Diagnosis not present

## 2024-04-15 DIAGNOSIS — D631 Anemia in chronic kidney disease: Secondary | ICD-10-CM | POA: Diagnosis not present

## 2024-04-15 DIAGNOSIS — N2581 Secondary hyperparathyroidism of renal origin: Secondary | ICD-10-CM | POA: Diagnosis not present

## 2024-04-15 DIAGNOSIS — I953 Hypotension of hemodialysis: Secondary | ICD-10-CM | POA: Diagnosis not present

## 2024-04-15 DIAGNOSIS — R252 Cramp and spasm: Secondary | ICD-10-CM | POA: Diagnosis not present

## 2024-04-18 DIAGNOSIS — Z4931 Encounter for adequacy testing for hemodialysis: Secondary | ICD-10-CM | POA: Diagnosis not present

## 2024-04-18 DIAGNOSIS — Z992 Dependence on renal dialysis: Secondary | ICD-10-CM | POA: Diagnosis not present

## 2024-04-18 DIAGNOSIS — E1129 Type 2 diabetes mellitus with other diabetic kidney complication: Secondary | ICD-10-CM | POA: Diagnosis not present

## 2024-04-18 DIAGNOSIS — N186 End stage renal disease: Secondary | ICD-10-CM | POA: Diagnosis not present

## 2024-04-18 DIAGNOSIS — R252 Cramp and spasm: Secondary | ICD-10-CM | POA: Diagnosis not present

## 2024-04-18 DIAGNOSIS — D631 Anemia in chronic kidney disease: Secondary | ICD-10-CM | POA: Diagnosis not present

## 2024-04-18 DIAGNOSIS — N2581 Secondary hyperparathyroidism of renal origin: Secondary | ICD-10-CM | POA: Diagnosis not present

## 2024-04-18 DIAGNOSIS — I953 Hypotension of hemodialysis: Secondary | ICD-10-CM | POA: Diagnosis not present

## 2024-04-19 DIAGNOSIS — N2581 Secondary hyperparathyroidism of renal origin: Secondary | ICD-10-CM | POA: Diagnosis not present

## 2024-04-19 DIAGNOSIS — R252 Cramp and spasm: Secondary | ICD-10-CM | POA: Diagnosis not present

## 2024-04-19 DIAGNOSIS — E1129 Type 2 diabetes mellitus with other diabetic kidney complication: Secondary | ICD-10-CM | POA: Diagnosis not present

## 2024-04-19 DIAGNOSIS — Z4931 Encounter for adequacy testing for hemodialysis: Secondary | ICD-10-CM | POA: Diagnosis not present

## 2024-04-19 DIAGNOSIS — D631 Anemia in chronic kidney disease: Secondary | ICD-10-CM | POA: Diagnosis not present

## 2024-04-19 DIAGNOSIS — N186 End stage renal disease: Secondary | ICD-10-CM | POA: Diagnosis not present

## 2024-04-19 DIAGNOSIS — I953 Hypotension of hemodialysis: Secondary | ICD-10-CM | POA: Diagnosis not present

## 2024-04-19 DIAGNOSIS — Z992 Dependence on renal dialysis: Secondary | ICD-10-CM | POA: Diagnosis not present

## 2024-04-22 DIAGNOSIS — I953 Hypotension of hemodialysis: Secondary | ICD-10-CM | POA: Diagnosis not present

## 2024-04-22 DIAGNOSIS — Z992 Dependence on renal dialysis: Secondary | ICD-10-CM | POA: Diagnosis not present

## 2024-04-22 DIAGNOSIS — N186 End stage renal disease: Secondary | ICD-10-CM | POA: Diagnosis not present

## 2024-04-22 DIAGNOSIS — Z4931 Encounter for adequacy testing for hemodialysis: Secondary | ICD-10-CM | POA: Diagnosis not present

## 2024-04-22 DIAGNOSIS — N2581 Secondary hyperparathyroidism of renal origin: Secondary | ICD-10-CM | POA: Diagnosis not present

## 2024-04-22 DIAGNOSIS — E1129 Type 2 diabetes mellitus with other diabetic kidney complication: Secondary | ICD-10-CM | POA: Diagnosis not present

## 2024-04-22 DIAGNOSIS — D631 Anemia in chronic kidney disease: Secondary | ICD-10-CM | POA: Diagnosis not present

## 2024-04-22 DIAGNOSIS — R252 Cramp and spasm: Secondary | ICD-10-CM | POA: Diagnosis not present

## 2024-04-25 DIAGNOSIS — D631 Anemia in chronic kidney disease: Secondary | ICD-10-CM | POA: Diagnosis not present

## 2024-04-25 DIAGNOSIS — I953 Hypotension of hemodialysis: Secondary | ICD-10-CM | POA: Diagnosis not present

## 2024-04-25 DIAGNOSIS — Z4931 Encounter for adequacy testing for hemodialysis: Secondary | ICD-10-CM | POA: Diagnosis not present

## 2024-04-25 DIAGNOSIS — N186 End stage renal disease: Secondary | ICD-10-CM | POA: Diagnosis not present

## 2024-04-25 DIAGNOSIS — N2581 Secondary hyperparathyroidism of renal origin: Secondary | ICD-10-CM | POA: Diagnosis not present

## 2024-04-25 DIAGNOSIS — E1129 Type 2 diabetes mellitus with other diabetic kidney complication: Secondary | ICD-10-CM | POA: Diagnosis not present

## 2024-04-25 DIAGNOSIS — Z992 Dependence on renal dialysis: Secondary | ICD-10-CM | POA: Diagnosis not present

## 2024-04-25 DIAGNOSIS — R252 Cramp and spasm: Secondary | ICD-10-CM | POA: Diagnosis not present

## 2024-04-26 DIAGNOSIS — E1129 Type 2 diabetes mellitus with other diabetic kidney complication: Secondary | ICD-10-CM | POA: Diagnosis not present

## 2024-04-26 DIAGNOSIS — N2581 Secondary hyperparathyroidism of renal origin: Secondary | ICD-10-CM | POA: Diagnosis not present

## 2024-04-26 DIAGNOSIS — I953 Hypotension of hemodialysis: Secondary | ICD-10-CM | POA: Diagnosis not present

## 2024-04-26 DIAGNOSIS — Z4931 Encounter for adequacy testing for hemodialysis: Secondary | ICD-10-CM | POA: Diagnosis not present

## 2024-04-26 DIAGNOSIS — R252 Cramp and spasm: Secondary | ICD-10-CM | POA: Diagnosis not present

## 2024-04-26 DIAGNOSIS — N186 End stage renal disease: Secondary | ICD-10-CM | POA: Diagnosis not present

## 2024-04-26 DIAGNOSIS — Z992 Dependence on renal dialysis: Secondary | ICD-10-CM | POA: Diagnosis not present

## 2024-04-26 DIAGNOSIS — D631 Anemia in chronic kidney disease: Secondary | ICD-10-CM | POA: Diagnosis not present

## 2024-04-28 DIAGNOSIS — N186 End stage renal disease: Secondary | ICD-10-CM | POA: Diagnosis not present

## 2024-04-28 DIAGNOSIS — D631 Anemia in chronic kidney disease: Secondary | ICD-10-CM | POA: Diagnosis not present

## 2024-04-28 DIAGNOSIS — E1129 Type 2 diabetes mellitus with other diabetic kidney complication: Secondary | ICD-10-CM | POA: Diagnosis not present

## 2024-04-28 DIAGNOSIS — Z4931 Encounter for adequacy testing for hemodialysis: Secondary | ICD-10-CM | POA: Diagnosis not present

## 2024-04-28 DIAGNOSIS — N2581 Secondary hyperparathyroidism of renal origin: Secondary | ICD-10-CM | POA: Diagnosis not present

## 2024-04-28 DIAGNOSIS — R252 Cramp and spasm: Secondary | ICD-10-CM | POA: Diagnosis not present

## 2024-04-28 DIAGNOSIS — I953 Hypotension of hemodialysis: Secondary | ICD-10-CM | POA: Diagnosis not present

## 2024-04-28 DIAGNOSIS — Z992 Dependence on renal dialysis: Secondary | ICD-10-CM | POA: Diagnosis not present

## 2024-04-29 DIAGNOSIS — E1129 Type 2 diabetes mellitus with other diabetic kidney complication: Secondary | ICD-10-CM | POA: Diagnosis not present

## 2024-04-29 DIAGNOSIS — R252 Cramp and spasm: Secondary | ICD-10-CM | POA: Diagnosis not present

## 2024-04-29 DIAGNOSIS — Z4931 Encounter for adequacy testing for hemodialysis: Secondary | ICD-10-CM | POA: Diagnosis not present

## 2024-04-29 DIAGNOSIS — Z992 Dependence on renal dialysis: Secondary | ICD-10-CM | POA: Diagnosis not present

## 2024-04-29 DIAGNOSIS — I953 Hypotension of hemodialysis: Secondary | ICD-10-CM | POA: Diagnosis not present

## 2024-04-29 DIAGNOSIS — N2581 Secondary hyperparathyroidism of renal origin: Secondary | ICD-10-CM | POA: Diagnosis not present

## 2024-04-29 DIAGNOSIS — N186 End stage renal disease: Secondary | ICD-10-CM | POA: Diagnosis not present

## 2024-05-02 DIAGNOSIS — R252 Cramp and spasm: Secondary | ICD-10-CM | POA: Diagnosis not present

## 2024-05-02 DIAGNOSIS — I953 Hypotension of hemodialysis: Secondary | ICD-10-CM | POA: Diagnosis not present

## 2024-05-02 DIAGNOSIS — Z992 Dependence on renal dialysis: Secondary | ICD-10-CM | POA: Diagnosis not present

## 2024-05-02 DIAGNOSIS — N2581 Secondary hyperparathyroidism of renal origin: Secondary | ICD-10-CM | POA: Diagnosis not present

## 2024-05-02 DIAGNOSIS — N186 End stage renal disease: Secondary | ICD-10-CM | POA: Diagnosis not present

## 2024-05-02 DIAGNOSIS — Z4931 Encounter for adequacy testing for hemodialysis: Secondary | ICD-10-CM | POA: Diagnosis not present

## 2024-05-03 DIAGNOSIS — N186 End stage renal disease: Secondary | ICD-10-CM | POA: Diagnosis not present

## 2024-05-03 DIAGNOSIS — Z992 Dependence on renal dialysis: Secondary | ICD-10-CM | POA: Diagnosis not present

## 2024-05-03 DIAGNOSIS — Z4931 Encounter for adequacy testing for hemodialysis: Secondary | ICD-10-CM | POA: Diagnosis not present

## 2024-05-03 DIAGNOSIS — R252 Cramp and spasm: Secondary | ICD-10-CM | POA: Diagnosis not present

## 2024-05-03 DIAGNOSIS — I953 Hypotension of hemodialysis: Secondary | ICD-10-CM | POA: Diagnosis not present

## 2024-05-03 DIAGNOSIS — N2581 Secondary hyperparathyroidism of renal origin: Secondary | ICD-10-CM | POA: Diagnosis not present

## 2024-05-04 ENCOUNTER — Ambulatory Visit (INDEPENDENT_AMBULATORY_CARE_PROVIDER_SITE_OTHER): Admitting: Podiatry

## 2024-05-04 ENCOUNTER — Ambulatory Visit: Payer: Medicare Other | Admitting: Internal Medicine

## 2024-05-04 ENCOUNTER — Encounter: Payer: Self-pay | Admitting: Internal Medicine

## 2024-05-04 ENCOUNTER — Encounter: Payer: Self-pay | Admitting: Podiatry

## 2024-05-04 VITALS — Ht 69.0 in | Wt 191.0 lb

## 2024-05-04 VITALS — BP 124/78 | HR 84 | Ht 69.0 in | Wt 191.0 lb

## 2024-05-04 DIAGNOSIS — Z89511 Acquired absence of right leg below knee: Secondary | ICD-10-CM | POA: Diagnosis not present

## 2024-05-04 DIAGNOSIS — N186 End stage renal disease: Secondary | ICD-10-CM

## 2024-05-04 DIAGNOSIS — E785 Hyperlipidemia, unspecified: Secondary | ICD-10-CM

## 2024-05-04 DIAGNOSIS — Z992 Dependence on renal dialysis: Secondary | ICD-10-CM | POA: Diagnosis not present

## 2024-05-04 DIAGNOSIS — E113593 Type 2 diabetes mellitus with proliferative diabetic retinopathy without macular edema, bilateral: Secondary | ICD-10-CM | POA: Diagnosis not present

## 2024-05-04 DIAGNOSIS — L97522 Non-pressure chronic ulcer of other part of left foot with fat layer exposed: Secondary | ICD-10-CM

## 2024-05-04 DIAGNOSIS — Z794 Long term (current) use of insulin: Secondary | ICD-10-CM

## 2024-05-04 DIAGNOSIS — E1122 Type 2 diabetes mellitus with diabetic chronic kidney disease: Secondary | ICD-10-CM

## 2024-05-04 LAB — LIPID PANEL
Cholesterol: 91 mg/dL (ref <200)
HDL: 38 mg/dL — ABNORMAL LOW (ref >=40)
LDL Cholesterol (Calc): 38 mg/dL
Non-HDL Cholesterol (Calc): 53 mg/dL (ref <130)
Total CHOL/HDL Ratio: 2.4 (calc) (ref <5.0)
Triglycerides: 72 mg/dL (ref <150)

## 2024-05-04 LAB — POCT GLYCOSYLATED HEMOGLOBIN (HGB A1C): Hemoglobin A1C: 5.9 % — AB (ref 4.0–5.6)

## 2024-05-04 NOTE — Progress Notes (Unsigned)
 Name: Duane Hall  Age/ Sex: 68 y.o., male   MRN/ DOB: [id number], 07-Sep-1956     PCP: Courtney Ditto, PA (Inactive)   Reason for Endocrinology Evaluation: Type 2 Diabetes Mellitus  Initial Endocrine Consultative Visit: 08/16/2020    PATIENT IDENTIFIER: Duane Hall is a 68 y.o. male with a past medical history of HTN, ESRD on HD, PVD, asthma, gastroparesis. The patient has followed with Endocrinology clinic since 08/16/2020 for consultative assistance with management of his diabetes.  DIABETIC HISTORY:  Duane Hall was diagnosed with DM 2005, and started insulin  therapy in 2021, omnipod started 12/2020. Intolerant to Trulicity due to gastroparesis. His hemoglobin A1c has ranged from 5.6% in 2023, peaking at 11.8% in 2016.  He was followed by Dr. Washington Hacker on 2021 until 04/2022 SUBJECTIVE:   During the last visit (10/31/2023): A1c 6.3%     Today (05/04/2024): Mr. Larke is here for follow-up on diabetes management.Duane Hall  He checks his blood sugars multiple times daily, through CGM.  The patient has  not had hypoglycemic episodes since the last clinic visit.   S/P left great toe amputation 01/09/2023  Continues to follow-up with podiatry 02/24/2024, for a left foot ulceration Patient continues to follow-up with vascular surgery for peripheral vascular disease, he is s/p angiogram/angioplasty 11/2023  He continues on home dialysis Sunday, Monday, Thursday, and Wednesday  He has chronic diarrhea with occasional vomiting , declines GI referral  Denies fever   This patient with type 2 diabetes is treated with OmniPod 5 (insulin pump). During the visit the pump basal and bolus doses were reviewed including carb/insulin rations and supplemental doses. The clinical list was updated. The glucose meter download was reviewed in detail to determine if the current pump settings are providing the best glycemic control without excessive hypoglycemia.  Pump and meter download:       Pump    OmniPod 5  Settings   Insulin type   Humalog    Basal rate          0000 0.10 u/h     0600 0.15 u/h     0900 0.35    2100 0.15 u/h          I:C ratio          0000  1:5                              Sensitivity          0000  40         Goal          00 00  110             Type & Model of Pump: OmniPod 5 Insulin  Type: Currently using Humalog .  Body mass index is 28.21 kg/m.  PUMP STATISTICS: Average BG: 169 Average Daily Carbs (g): 45.5 Average Total Daily Insulin : 18 Average Daily Basal: 10.3 (57 %) Average Daily Bolus: 7.7 (43 %)    CONTINUOUS GLUCOSE MONITORING RECORD INTERPRETATION    Dates of Recording: 4/23-05/04/2024  Sensor description:dexcom  Results statistics:   CGM use % of time 79.2  Average and SD 169/38  Time in range 69 %  % Time Above 180 27  % Time above 250 4  % Time Below target 0   Glycemic patterns summary: Optimal overnight and Fluctuate during the day Hyperglycemic episodes  postprandial   Hypoglycemic episodes occurred N/A  Overnight periods: Optimal  HOME DIABETES REGIMEN:  Humalog    Statin: Yes ACE-I/ARB: no    DIABETIC COMPLICATIONS: Microvascular complications:  Proliferative DR ( S/P laser) , ESRD on dialysis ,Right BKA , left great toe amputation Last Eye Exam: Completed 2022   Macrovascular complications:  PVD Denies: CAD, CVA   HISTORY:  Past Medical History:  Past Medical History:  Diagnosis Date   Asthma    Cancer (HCC)    Cataract    CKD (chronic kidney disease), stage III (HCC)    Dialysis [facility] at [facility]   Diabetes mellitus    Glaucoma    Hepatitis    Hep C   Hypertension    no longer   Osteomyelitis of great toe of left foot [facility])    Vascular insufficiency 05/2020   Past Surgical History:  Past Surgical History:  Procedure Laterality Date   A/V FISTULAGRAM N/A 12/08/2023   Procedure: A/V Fistulagram;  Surgeon: Patrick Boor, MD;  Location: [facility] INVASIVE CV LAB;   Service: Cardiovascular;  Laterality: N/A;   ABDOMINAL AORTOGRAM W/LOWER EXTREMITY N/A 06/19/2020   Procedure: ABDOMINAL AORTOGRAM W/LOWER EXTREMITY;  Surgeon: Adine Hoof, MD;  Location: [facility] INVASIVE CV LAB;  Service: Cardiovascular;  Laterality: N/A;   ABDOMINAL AORTOGRAM W/LOWER EXTREMITY Left 10/16/2020   Procedure: ABDOMINAL AORTOGRAM W/LOWER EXTREMITY;  Surgeon: Adine Hoof, MD;  Location: [facility] INVASIVE CV LAB;  Service: Cardiovascular;  Laterality: Left;   ABDOMINAL AORTOGRAM W/LOWER EXTREMITY N/A 12/25/2020   Procedure: ABDOMINAL AORTOGRAM W/LOWER EXTREMITY;  Surgeon: Adine Hoof, MD;  Location: [facility] INVASIVE CV LAB;  Service: Cardiovascular;  Laterality: N/A;   ABDOMINAL AORTOGRAM W/LOWER EXTREMITY N/A 10/21/2022   Procedure: ABDOMINAL AORTOGRAM W/LOWER EXTREMITY;  Surgeon: Adine Hoof, MD;  Location: [facility] INVASIVE CV LAB;  Service: Cardiovascular;  Laterality: N/A;   AMPUTATION Right 01/12/2021   Procedure: 1ST AMPUTATION RAY;  Surgeon: Floyce Hutching, DPM;  Location: [facility] OR;  Service: Podiatry;  Laterality: Right;   AMPUTATION Right 03/13/2021   Procedure: RIGHT BELOW KNEE AMPUTATION;  Surgeon: Adine Hoof, MD;  Location: [facility] OR;  Service: Vascular;  Laterality: Right;   AMPUTATION TOE Left 11/08/2022   Procedure: AMPUTATION TOE;  Surgeon: Floyce Hutching, DPM;  Location: [facility] ORS;  Service: Podiatry;  Laterality: Left;  DR MCDONALD WILL BLOCK, ON STRETCHER   AMPUTATION TOE Left 01/09/2023   Procedure: AMPUTATION OF REMAINING LEFT GREAT TOE AND BONE BEHIND TOE;  Surgeon: Floyce Hutching, DPM;  Location: [facility] OR;  Service: Podiatry;  Laterality: Left;   AV FISTULA PLACEMENT Right 09/14/2019   Procedure: Right Arm Basilic Vein transposition;  Surgeon: Dannis Dy, MD;  Location: [facility] OR;  Service: Vascular;  Laterality: Right;   BONE BIOPSY Left 07/29/2020   Procedure: BONE BIOPSY;  Surgeon: Floyce Hutching, DPM;   Location: [facility] OR;  Service: Podiatry;  Laterality: Left;  Need bone trephines and/or large bore Giamshidi   COLONOSCOPY     COLONOSCOPY WITH PROPOFOL  N/A 12/07/2021   Procedure: COLONOSCOPY WITH PROPOFOL ;  Surgeon: Alvis Jourdain, MD;  Location: [facility] ENDOSCOPY;  Service: Endoscopy;  Laterality: N/A;   PERIPHERAL VASCULAR ATHERECTOMY Left 06/19/2020   Procedure: PERIPHERAL VASCULAR ATHERECTOMY;  Surgeon: Adine Hoof, MD;  Location: [facility] INVASIVE CV LAB;  Service: Cardiovascular;  Laterality: Left;  SFA   PERIPHERAL VASCULAR ATHERECTOMY  12/25/2020   Procedure: PERIPHERAL VASCULAR ATHERECTOMY;  Surgeon: Adine Hoof, MD;  Location: [facility] INVASIVE CV LAB;  Service: Cardiovascular;;  Lt. PT - Laser Lt.  SFA - Laser   PERIPHERAL VASCULAR BALLOON ANGIOPLASTY  12/25/2020   Procedure: PERIPHERAL VASCULAR BALLOON ANGIOPLASTY;  Surgeon: Adine Hoof, MD;  Location: [facility] INVASIVE CV LAB;  Service: Cardiovascular;;  Lt. SFA and PT   PERIPHERAL VASCULAR BALLOON ANGIOPLASTY Left 10/21/2022   Procedure: PERIPHERAL VASCULAR BALLOON ANGIOPLASTY;  Surgeon: Adine Hoof, MD;  Location: [facility] INVASIVE CV LAB;  Service: Cardiovascular;  Laterality: Left;   PERIPHERAL VASCULAR BALLOON ANGIOPLASTY Right 12/08/2023   Procedure: PERIPHERAL VASCULAR BALLOON ANGIOPLASTY;  Surgeon: Patrick Boor, MD;  Location: [facility] INVASIVE CV LAB;  Service: Cardiovascular;  Laterality: Right;   PERIPHERAL VASCULAR INTERVENTION Left 10/21/2022   Procedure: PERIPHERAL VASCULAR INTERVENTION;  Surgeon: Adine Hoof, MD;  Location: [facility] INVASIVE CV LAB;  Service: Cardiovascular;  Laterality: Left;   POLYPECTOMY  12/07/2021   Procedure: POLYPECTOMY;  Surgeon: Alvis Jourdain, MD;  Location: [facility] ENDOSCOPY;  Service: Endoscopy;;   UPPER GASTROINTESTINAL ENDOSCOPY  02/2019   Dr Nickey Barn     WISDOM TOOTH EXTRACTION     Social History:  reports that he has been smoking cigarettes. He has never used smokeless  tobacco. He reports that he does not drink alcohol and does not use drugs. Family History:  Family History  Problem Relation Age of Onset   Cancer Father    Diabetes Mother      HOME MEDICATIONS: Allergies as of 05/04/2024       Reactions   Amoxicillin -pot Clavulanate Diarrhea   Midodrine  Other (See Comments)   Other reaction(s): Urinary Sensation   Tape Other (See Comments)   Latex band aids cause blistering        Medication List        Accurate as of May 04, 2024 12:14 PM. If you have any questions, ask your nurse or doctor.          aspirin  EC 81 MG tablet Take 1 tablet (81 mg total) by mouth daily. Swallow whole.   cholestyramine light 4 g packet Commonly known as: PREVALITE AS DIRECTED ORALLY 2 TIMES A DAY for 30 DAY(S)   clopidogrel  75 MG tablet Commonly known as: PLAVIX  Take 1 tablet (75 mg total) by mouth daily with breakfast.   Dexcom G6 Sensor Misc 1 Device by Does not apply route See admin instructions. Change every 10 days   Dexcom G7 Sensor Misc 1 Device by Does not apply route continuous.   Dexcom G6 Transmitter Misc Change every 3 mos   insulin  lispro 100 UNIT/ML injection Commonly known as: HumaLOG  For use in pump, total of 60 units per day   MIRCERA IJ Inject into the skin.   Omnipod 5 DexG7G6 Pods Gen 5 Misc 1 Device by Does not apply route every 3 (three) days.   Omnipod 5 DexG7G6 Pods Gen 5 Misc Change pod every 3 days   Omnipod 5 DexG7G6 Intro Gen 5 Kit Change pod every 3 days   pantoprazole  40 MG tablet Commonly known as: PROTONIX  Take 1 tablet (40 mg total) by mouth daily.   rosuvastatin  10 MG tablet Commonly known as: CRESTOR  TAKE 1 TABLET BY MOUTH DAILY         OBJECTIVE:   Vital Signs: Ht 5\' 9"  (1.753 m)   Wt 191 lb (86.6 kg)   BMI 28.21 kg/m   Wt Readings from Last 3 Encounters:  05/04/24 191 lb (86.6 kg)  12/02/23 192 lb 11.2 oz (87.4 kg)  10/31/23 191 lb (86.6 kg)     Exam: General: Pt appears  well and is in NAD  Lungs: Clear with good BS bilat   Heart: RRR   Extremities: Right BKA   Neuro: MS is good with appropriate affect, pt is alert and Ox3    DM Foot Exam 02/24/2024 per podiatry   DATA REVIEWED:  Lab Results  Component Value Date   HGBA1C 6.3 (A) 10/31/2023   HGBA1C 5.6 04/29/2023   HGBA1C 5.7 (H) 11/07/2022   ASSESSMENT / PLAN / RECOMMENDATIONS:   1) Type 2 Diabetes Mellitus, optimally controlled, With retinopathic complications, ESRD on HD and S/P BKA- Most recent A1c of 5.9 %. Goal A1c < 7.0 %.     - A1c skewed due to ESRD    MEDICATIONS: Humalog      Pump   OmniPod 5  Settings   Insulin  type   Humalog    Basal rate       0000 0.15 u/h    0600 0.15 u/h    0900 0.35   2100 0.15 u/h       I:C ratio       0000  1:5                  Sensitivity       0000  40      Goal       0000  110          EDUCATION / INSTRUCTIONS: BG monitoring instructions: Patient is instructed to check his blood sugars 3 times a day, before each meal . Call [facility] if: BG persistently < 70  I reviewed the Rule of 15 for the treatment of hypoglycemia in detail with the patient. Literature supplied.    2) Diabetic complications:  Eye: Does  have known diabetic retinopathy.  Neuro/ Feet: Does not have known diabetic peripheral neuropathy .  Renal: Patient does  have known baseline CKD. He   is not on an ACEI/ARB at present.    F/U in 6 months     Signed electronically by: Natale Bail, MD  [facility] Endocrinology  [facility] [address] [place], [place] [address] Phone: [phone] FAX: [phone]   CC: Courtney Ditto, [place] (Inactive) [address] [place] [address] Phone: [phone]  Fax: [phone]  Return to Endocrinology clinic as below: Future Appointments  Date Time Provider Department Center  05/04/2024  4:15 PM Floyce Hutching, DPM [facility]   06/02/2024  9:00 AM [facility]  06/02/2024 10:00 AM [facility]  06/02/2024 10:30 AM [facility]

## 2024-05-04 NOTE — Patient Instructions (Signed)

## 2024-05-05 ENCOUNTER — Other Ambulatory Visit

## 2024-05-05 ENCOUNTER — Encounter: Payer: Self-pay | Admitting: Internal Medicine

## 2024-05-05 DIAGNOSIS — E1122 Type 2 diabetes mellitus with diabetic chronic kidney disease: Secondary | ICD-10-CM | POA: Diagnosis not present

## 2024-05-05 DIAGNOSIS — Z992 Dependence on renal dialysis: Secondary | ICD-10-CM | POA: Diagnosis not present

## 2024-05-05 DIAGNOSIS — E785 Hyperlipidemia, unspecified: Secondary | ICD-10-CM | POA: Insufficient documentation

## 2024-05-05 DIAGNOSIS — N186 End stage renal disease: Secondary | ICD-10-CM | POA: Diagnosis not present

## 2024-05-05 MED ORDER — ROSUVASTATIN CALCIUM 10 MG PO TABS: 10.0000 mg | ORAL_TABLET | Freq: Every day | ORAL | 3 refills | Status: DC

## 2024-05-06 DIAGNOSIS — R252 Cramp and spasm: Secondary | ICD-10-CM | POA: Diagnosis not present

## 2024-05-06 DIAGNOSIS — Z4931 Encounter for adequacy testing for hemodialysis: Secondary | ICD-10-CM | POA: Diagnosis not present

## 2024-05-06 DIAGNOSIS — I953 Hypotension of hemodialysis: Secondary | ICD-10-CM | POA: Diagnosis not present

## 2024-05-06 DIAGNOSIS — N2581 Secondary hyperparathyroidism of renal origin: Secondary | ICD-10-CM | POA: Diagnosis not present

## 2024-05-06 DIAGNOSIS — Z992 Dependence on renal dialysis: Secondary | ICD-10-CM | POA: Diagnosis not present

## 2024-05-06 DIAGNOSIS — N186 End stage renal disease: Secondary | ICD-10-CM | POA: Diagnosis not present

## 2024-05-06 LAB — MICROALBUMIN / CREATININE URINE RATIO
Creatinine, Urine: 122 mg/dL (ref 20–320)
Microalb Creat Ratio: 2302 mg/g{creat} — ABNORMAL HIGH (ref <30)
Microalb, Ur: 280.8 mg/dL

## 2024-05-07 NOTE — Progress Notes (Signed)
  Subjective:  Patient ID: Duane Hall, male    DOB: 1956-12-22,  MRN: [id number]  Chief Complaint  Patient presents with   Wound Check    Patient is here for wound care    68 y.o. male presents with the above complaint. History confirmed with patient.    Objective:  Physical Exam: Left foot is warm and there is nonpalpable pulses he does have edema of the lower extremity ulceration of the tip of the second toe has nearly fully healed with a dorsal scab.  Surrounding hyperkeratosis.     Culture with skin flora   Assessment:   1. Skin ulcer of toe of left foot with fat layer exposed (HCC)      Plan:  Patient was evaluated and treated and all questions answered.  Ulcer left second toe - Doing much better had a small scab today.  I debrided this and there is no underlying ulcer remaining.  Will take some time for the scabs to fully resolved.  Would like to see 1 more time in 1 month to reevaluate.  Follow-up as needed after that    Return in about 1 month (around 06/04/2024) for wound check.

## 2024-05-09 DIAGNOSIS — R252 Cramp and spasm: Secondary | ICD-10-CM | POA: Diagnosis not present

## 2024-05-09 DIAGNOSIS — N186 End stage renal disease: Secondary | ICD-10-CM | POA: Diagnosis not present

## 2024-05-09 DIAGNOSIS — N2581 Secondary hyperparathyroidism of renal origin: Secondary | ICD-10-CM | POA: Diagnosis not present

## 2024-05-09 DIAGNOSIS — I953 Hypotension of hemodialysis: Secondary | ICD-10-CM | POA: Diagnosis not present

## 2024-05-09 DIAGNOSIS — Z992 Dependence on renal dialysis: Secondary | ICD-10-CM | POA: Diagnosis not present

## 2024-05-09 DIAGNOSIS — Z4931 Encounter for adequacy testing for hemodialysis: Secondary | ICD-10-CM | POA: Diagnosis not present

## 2024-05-10 DIAGNOSIS — R252 Cramp and spasm: Secondary | ICD-10-CM | POA: Diagnosis not present

## 2024-05-10 DIAGNOSIS — Z4931 Encounter for adequacy testing for hemodialysis: Secondary | ICD-10-CM | POA: Diagnosis not present

## 2024-05-10 DIAGNOSIS — N2581 Secondary hyperparathyroidism of renal origin: Secondary | ICD-10-CM | POA: Diagnosis not present

## 2024-05-10 DIAGNOSIS — N186 End stage renal disease: Secondary | ICD-10-CM | POA: Diagnosis not present

## 2024-05-10 DIAGNOSIS — Z992 Dependence on renal dialysis: Secondary | ICD-10-CM | POA: Diagnosis not present

## 2024-05-10 DIAGNOSIS — I953 Hypotension of hemodialysis: Secondary | ICD-10-CM | POA: Diagnosis not present

## 2024-05-12 DIAGNOSIS — N2581 Secondary hyperparathyroidism of renal origin: Secondary | ICD-10-CM | POA: Diagnosis not present

## 2024-05-12 DIAGNOSIS — Z992 Dependence on renal dialysis: Secondary | ICD-10-CM | POA: Diagnosis not present

## 2024-05-12 DIAGNOSIS — Z4931 Encounter for adequacy testing for hemodialysis: Secondary | ICD-10-CM | POA: Diagnosis not present

## 2024-05-12 DIAGNOSIS — N186 End stage renal disease: Secondary | ICD-10-CM | POA: Diagnosis not present

## 2024-05-12 DIAGNOSIS — I953 Hypotension of hemodialysis: Secondary | ICD-10-CM | POA: Diagnosis not present

## 2024-05-12 DIAGNOSIS — R252 Cramp and spasm: Secondary | ICD-10-CM | POA: Diagnosis not present

## 2024-05-13 DIAGNOSIS — N186 End stage renal disease: Secondary | ICD-10-CM | POA: Diagnosis not present

## 2024-05-13 DIAGNOSIS — N2581 Secondary hyperparathyroidism of renal origin: Secondary | ICD-10-CM | POA: Diagnosis not present

## 2024-05-13 DIAGNOSIS — Z4931 Encounter for adequacy testing for hemodialysis: Secondary | ICD-10-CM | POA: Diagnosis not present

## 2024-05-13 DIAGNOSIS — Z992 Dependence on renal dialysis: Secondary | ICD-10-CM | POA: Diagnosis not present

## 2024-05-13 DIAGNOSIS — R252 Cramp and spasm: Secondary | ICD-10-CM | POA: Diagnosis not present

## 2024-05-13 DIAGNOSIS — I953 Hypotension of hemodialysis: Secondary | ICD-10-CM | POA: Diagnosis not present

## 2024-05-18 DIAGNOSIS — Z4931 Encounter for adequacy testing for hemodialysis: Secondary | ICD-10-CM | POA: Diagnosis not present

## 2024-05-18 DIAGNOSIS — I953 Hypotension of hemodialysis: Secondary | ICD-10-CM | POA: Diagnosis not present

## 2024-05-18 DIAGNOSIS — R252 Cramp and spasm: Secondary | ICD-10-CM | POA: Diagnosis not present

## 2024-05-18 DIAGNOSIS — N2581 Secondary hyperparathyroidism of renal origin: Secondary | ICD-10-CM | POA: Diagnosis not present

## 2024-05-18 DIAGNOSIS — N186 End stage renal disease: Secondary | ICD-10-CM | POA: Diagnosis not present

## 2024-05-18 DIAGNOSIS — Z992 Dependence on renal dialysis: Secondary | ICD-10-CM | POA: Diagnosis not present

## 2024-05-20 DIAGNOSIS — N186 End stage renal disease: Secondary | ICD-10-CM | POA: Diagnosis not present

## 2024-05-20 DIAGNOSIS — Z992 Dependence on renal dialysis: Secondary | ICD-10-CM | POA: Diagnosis not present

## 2024-05-20 DIAGNOSIS — I953 Hypotension of hemodialysis: Secondary | ICD-10-CM | POA: Diagnosis not present

## 2024-05-20 DIAGNOSIS — N2581 Secondary hyperparathyroidism of renal origin: Secondary | ICD-10-CM | POA: Diagnosis not present

## 2024-05-20 DIAGNOSIS — Z4931 Encounter for adequacy testing for hemodialysis: Secondary | ICD-10-CM | POA: Diagnosis not present

## 2024-05-20 DIAGNOSIS — R252 Cramp and spasm: Secondary | ICD-10-CM | POA: Diagnosis not present

## 2024-05-21 DIAGNOSIS — N2581 Secondary hyperparathyroidism of renal origin: Secondary | ICD-10-CM | POA: Diagnosis not present

## 2024-05-21 DIAGNOSIS — Z992 Dependence on renal dialysis: Secondary | ICD-10-CM | POA: Diagnosis not present

## 2024-05-21 DIAGNOSIS — R252 Cramp and spasm: Secondary | ICD-10-CM | POA: Diagnosis not present

## 2024-05-21 DIAGNOSIS — N186 End stage renal disease: Secondary | ICD-10-CM | POA: Diagnosis not present

## 2024-05-21 DIAGNOSIS — I953 Hypotension of hemodialysis: Secondary | ICD-10-CM | POA: Diagnosis not present

## 2024-05-21 DIAGNOSIS — Z4931 Encounter for adequacy testing for hemodialysis: Secondary | ICD-10-CM | POA: Diagnosis not present

## 2024-05-23 ENCOUNTER — Other Ambulatory Visit: Payer: Self-pay | Admitting: Internal Medicine

## 2024-05-23 DIAGNOSIS — R252 Cramp and spasm: Secondary | ICD-10-CM | POA: Diagnosis not present

## 2024-05-23 DIAGNOSIS — Z992 Dependence on renal dialysis: Secondary | ICD-10-CM | POA: Diagnosis not present

## 2024-05-23 DIAGNOSIS — N186 End stage renal disease: Secondary | ICD-10-CM | POA: Diagnosis not present

## 2024-05-23 DIAGNOSIS — Z4931 Encounter for adequacy testing for hemodialysis: Secondary | ICD-10-CM | POA: Diagnosis not present

## 2024-05-23 DIAGNOSIS — N2581 Secondary hyperparathyroidism of renal origin: Secondary | ICD-10-CM | POA: Diagnosis not present

## 2024-05-23 DIAGNOSIS — I953 Hypotension of hemodialysis: Secondary | ICD-10-CM | POA: Diagnosis not present

## 2024-05-24 DIAGNOSIS — N186 End stage renal disease: Secondary | ICD-10-CM | POA: Diagnosis not present

## 2024-05-24 DIAGNOSIS — Z4931 Encounter for adequacy testing for hemodialysis: Secondary | ICD-10-CM | POA: Diagnosis not present

## 2024-05-24 DIAGNOSIS — Z992 Dependence on renal dialysis: Secondary | ICD-10-CM | POA: Diagnosis not present

## 2024-05-24 DIAGNOSIS — N2581 Secondary hyperparathyroidism of renal origin: Secondary | ICD-10-CM | POA: Diagnosis not present

## 2024-05-24 DIAGNOSIS — I953 Hypotension of hemodialysis: Secondary | ICD-10-CM | POA: Diagnosis not present

## 2024-05-24 DIAGNOSIS — R252 Cramp and spasm: Secondary | ICD-10-CM | POA: Diagnosis not present

## 2024-05-26 DIAGNOSIS — N2581 Secondary hyperparathyroidism of renal origin: Secondary | ICD-10-CM | POA: Diagnosis not present

## 2024-05-26 DIAGNOSIS — N186 End stage renal disease: Secondary | ICD-10-CM | POA: Diagnosis not present

## 2024-05-26 DIAGNOSIS — R252 Cramp and spasm: Secondary | ICD-10-CM | POA: Diagnosis not present

## 2024-05-26 DIAGNOSIS — Z4931 Encounter for adequacy testing for hemodialysis: Secondary | ICD-10-CM | POA: Diagnosis not present

## 2024-05-26 DIAGNOSIS — Z992 Dependence on renal dialysis: Secondary | ICD-10-CM | POA: Diagnosis not present

## 2024-05-26 DIAGNOSIS — I953 Hypotension of hemodialysis: Secondary | ICD-10-CM | POA: Diagnosis not present

## 2024-05-30 DIAGNOSIS — D631 Anemia in chronic kidney disease: Secondary | ICD-10-CM | POA: Diagnosis not present

## 2024-05-30 DIAGNOSIS — I953 Hypotension of hemodialysis: Secondary | ICD-10-CM | POA: Diagnosis not present

## 2024-05-30 DIAGNOSIS — N186 End stage renal disease: Secondary | ICD-10-CM | POA: Diagnosis not present

## 2024-05-30 DIAGNOSIS — N2581 Secondary hyperparathyroidism of renal origin: Secondary | ICD-10-CM | POA: Diagnosis not present

## 2024-05-30 DIAGNOSIS — R252 Cramp and spasm: Secondary | ICD-10-CM | POA: Diagnosis not present

## 2024-05-30 DIAGNOSIS — Z992 Dependence on renal dialysis: Secondary | ICD-10-CM | POA: Diagnosis not present

## 2024-05-31 DIAGNOSIS — R252 Cramp and spasm: Secondary | ICD-10-CM | POA: Diagnosis not present

## 2024-05-31 DIAGNOSIS — D631 Anemia in chronic kidney disease: Secondary | ICD-10-CM | POA: Diagnosis not present

## 2024-05-31 DIAGNOSIS — Z992 Dependence on renal dialysis: Secondary | ICD-10-CM | POA: Diagnosis not present

## 2024-05-31 DIAGNOSIS — N2581 Secondary hyperparathyroidism of renal origin: Secondary | ICD-10-CM | POA: Diagnosis not present

## 2024-05-31 DIAGNOSIS — N186 End stage renal disease: Secondary | ICD-10-CM | POA: Diagnosis not present

## 2024-05-31 DIAGNOSIS — I953 Hypotension of hemodialysis: Secondary | ICD-10-CM | POA: Diagnosis not present

## 2024-06-01 ENCOUNTER — Ambulatory Visit: Admitting: Podiatry

## 2024-06-01 ENCOUNTER — Encounter: Payer: Self-pay | Admitting: Podiatry

## 2024-06-01 DIAGNOSIS — L97521 Non-pressure chronic ulcer of other part of left foot limited to breakdown of skin: Secondary | ICD-10-CM

## 2024-06-01 NOTE — Patient Instructions (Signed)
 Use mupirocin  or gentamicin  ointment on the scab until it heals. Let me know if you need a refill of these

## 2024-06-02 ENCOUNTER — Ambulatory Visit (HOSPITAL_COMMUNITY)
Admission: RE | Admit: 2024-06-02 | Discharge: 2024-06-02 | Disposition: A | Discharge status: Home / Self Care | Payer: Medicare Other | Source: Ambulatory Visit | Attending: Vascular Surgery | Admitting: Vascular Surgery

## 2024-06-02 ENCOUNTER — Ambulatory Visit: Payer: Medicare Other | Attending: Vascular Surgery | Admitting: Physician Assistant

## 2024-06-02 VITALS — BP 150/82 | HR 87 | Temp 98.0°F | Ht 69.0 in | Wt 191.0 lb

## 2024-06-02 DIAGNOSIS — Z992 Dependence on renal dialysis: Secondary | ICD-10-CM | POA: Diagnosis not present

## 2024-06-02 DIAGNOSIS — I739 Peripheral vascular disease, unspecified: Secondary | ICD-10-CM

## 2024-06-02 DIAGNOSIS — N2581 Secondary hyperparathyroidism of renal origin: Secondary | ICD-10-CM | POA: Diagnosis not present

## 2024-06-02 DIAGNOSIS — N186 End stage renal disease: Secondary | ICD-10-CM | POA: Diagnosis not present

## 2024-06-02 DIAGNOSIS — R252 Cramp and spasm: Secondary | ICD-10-CM | POA: Diagnosis not present

## 2024-06-02 DIAGNOSIS — D631 Anemia in chronic kidney disease: Secondary | ICD-10-CM | POA: Diagnosis not present

## 2024-06-02 DIAGNOSIS — I953 Hypotension of hemodialysis: Secondary | ICD-10-CM | POA: Diagnosis not present

## 2024-06-02 LAB — VAS US ABI WITH/WO TBI: Left ABI: 0.97

## 2024-06-02 NOTE — Progress Notes (Signed)
 Office Note   History of Present Illness   Duane Hall is a 68 y.o. (11/24/1956) male who presents for surveillance of PAD.  He has a history of right BKA in 2022 by Dr. Vikki Hall.  He also has a history of left SFA and popliteal artery stenting with balloon angioplasty of the left TP trunk on 10/21/2022 by Dr. Vikki Hall.  He subsequently underwent left great toe amputation on 01/09/2023 by Dr. Michalene Hall.  He has completely healed his amputation site.   He returns today for follow-up.  He says that he is doing okay.  He has a slowly healing ulceration to the tip of his left second toe.  This is being followed by podiatry.  He denies any rest pain.  He continues to ambulate with his right BKA prosthetic.  Current Outpatient Medications  Medication Sig Dispense Refill   aspirin  EC 81 MG EC tablet Take 1 tablet (81 mg total) by mouth daily. Swallow whole. 30 tablet 11   cholestyramine light (PREVALITE) 4 g packet AS DIRECTED ORALLY 2 TIMES A DAY for 30 DAY(S)     clopidogrel  (PLAVIX ) 75 MG tablet Take 1 tablet (75 mg total) by mouth daily with breakfast. 30 tablet 3   Continuous Glucose Sensor (DEXCOM G7 SENSOR) MISC CHANGE SENSOR EVERY 10 DAYS OR AS DIRECTED BY PROVIDER 9 each 3   Insulin  Disposable Pump (OMNIPOD 5 DEXG7G6 INTRO GEN 5) KIT Change pod every 3 days 1 kit 0   Insulin  Disposable Pump (OMNIPOD 5 DEXG7G6 PODS GEN 5) MISC Change pod every 3 days 45 each 1   Insulin  Disposable Pump (OMNIPOD 5 G6 PODS, GEN 5,) MISC 1 Device by Does not apply route every 3 (three) days. 30 each 3   insulin  lispro (HUMALOG ) 100 UNIT/ML injection FOR USE IN PUMP, TOTAL OF 50 UNITS DAILY 50 mL 1   Methoxy PEG-Epoetin  Beta (MIRCERA IJ) Inject into the skin.     pantoprazole  (PROTONIX ) 40 MG tablet Take 1 tablet (40 mg total) by mouth daily.     rosuvastatin  (CRESTOR ) 10 MG tablet Take 1 tablet (10 mg total) by mouth daily. 90 tablet 3   No current facility-administered medications for this visit.    REVIEW OF  SYSTEMS (negative unless checked):   Cardiac:  []  Chest pain or chest pressure? []  Shortness of breath upon activity? []  Shortness of breath when lying flat? []  Irregular heart rhythm?  Vascular:  []  Pain in calf, thigh, or hip brought on by walking? []  Pain in feet at night that wakes you up from your sleep? []  Blood clot in your veins? []  Leg swelling?  Pulmonary:  []  Oxygen at home? []  Productive cough? []  Wheezing?  Neurologic:  []  Sudden weakness in arms or legs? []  Sudden numbness in arms or legs? []  Sudden onset of difficult speaking or slurred speech? []  Temporary loss of vision in one eye? []  Problems with dizziness?  Gastrointestinal:  []  Blood in stool? []  Vomited blood?  Genitourinary:  []  Burning when urinating? []  Blood in urine?  Psychiatric:  []  Major depression  Hematologic:  []  Bleeding problems? []  Problems with blood clotting?  Dermatologic:  []  Rashes or ulcers?  Constitutional:  []  Fever or chills?  Ear/Nose/Throat:  []  Change in hearing? []  Nose bleeds? []  Sore throat?  Musculoskeletal:  []  Back pain? []  Joint pain? []  Muscle pain?   Physical Examination    Vitals:   06/02/24 0953  BP: (!) 150/82  Pulse: 87  Temp: 98  F (36.7 C)  TempSrc: Temporal  SpO2: 92%  Weight: 191 lb (86.6 kg)  Height: 5\' 9"  (1.753 m)   Body mass index is 28.21 kg/m.  General:  WDWN in NAD; vital signs documented above Gait: Not observed HENT: WNL, normocephalic Pulmonary: normal non-labored breathing , without rales, rhonchi,  wheezing Cardiac: regular Abdomen: soft, NT, no masses Skin: without rashes Vascular Exam/Pulses: Brisk left DP/PT Doppler signals Extremities: Well-healed right BKA and left great toe amp.  Very small, dry ulceration to the tip of the left second toe Musculoskeletal: no muscle wasting or atrophy  Neurologic: A&O X 3;  No focal weakness or paresthesias are detected Psychiatric:  The pt has Normal  affect.  Non-Invasive Vascular imaging   ABI (06/02/2024) R BKA L:  ABI: 0.97 (0.92),  PT: mono DP: mono TBI: amp  LLE Arterial Duplex (06/02/2024) +-----------+--------+-----+---------------+----------+--------+  LEFT      PSV cm/sRatioStenosis       Waveform  Comments  +-----------+--------+-----+---------------+----------+--------+  CFA Distal 208          50-74% stenosisbiphasic            +-----------+--------+-----+---------------+----------+--------+  DFA       178                         biphasic            +-----------+--------+-----+---------------+----------+--------+  SFA Prox   201          50-74% stenosisbiphasic            +-----------+--------+-----+---------------+----------+--------+  SFA Mid    231          50-74% stenosisbiphasic            +-----------+--------+-----+---------------+----------+--------+  SFA Distal 127                         biphasic            +-----------+--------+-----+---------------+----------+--------+  POP Prox   67                          biphasic            +-----------+--------+-----+---------------+----------+--------+  POP Distal 108                         biphasic            +-----------+--------+-----+---------------+----------+--------+  TP Trunk   100                         biphasic            +-----------+--------+-----+---------------+----------+--------+  ATA Distal 23                          triphasic           +-----------+--------+-----+---------------+----------+--------+  PTA Distal 82                          biphasic            +-----------+--------+-----+---------------+----------+--------+  PERO Distal38                          monophasic          +-----------+--------+-----+---------------+----------+--------+   Medical Decision Making   Duane Crock  Hall is a 68 y.o. male who presents for surveillance of PAD  Based on the  patient's vascular studies, his ABIs on the left appear slightly increased from 0.92 to 0.97 LLE arterial duplex demonstrates 50 to 74% stenosis of the distal common femoral artery and proximal/mid SFA.  This does not appear flow-limiting.  His stents are likely patent, however they could not be easily visualized on duplex On exam he has brisk left DP/PT Doppler signals.  He has a palpable left femoral pulse.  He has a well-healed right BKA and left great toe amputation site.  He has a small, healing ulceration to the tip of his left second toe I believe that the patient's toe ulceration on the left should continue to heal without further intervention.  I have explained to the patient that we would like to see him again in 6 months for repeat studies.  He says that he does not want to come to our office anymore and would not plan to show up to any scheduled appointments.  He does not want any further vascular interventions and says that he would rather pass away in the future rather than get another amputation I have encouraged the patient to return to our clinic in the future if he changes his mind.  He can follow-up with our office as needed   Deneise Finlay PA-C [facility] Office: [phone]  Clinic MD: Duane Hall

## 2024-06-03 DIAGNOSIS — Z992 Dependence on renal dialysis: Secondary | ICD-10-CM | POA: Diagnosis not present

## 2024-06-03 DIAGNOSIS — N2581 Secondary hyperparathyroidism of renal origin: Secondary | ICD-10-CM | POA: Diagnosis not present

## 2024-06-03 DIAGNOSIS — D631 Anemia in chronic kidney disease: Secondary | ICD-10-CM | POA: Diagnosis not present

## 2024-06-03 DIAGNOSIS — N186 End stage renal disease: Secondary | ICD-10-CM | POA: Diagnosis not present

## 2024-06-03 DIAGNOSIS — I953 Hypotension of hemodialysis: Secondary | ICD-10-CM | POA: Diagnosis not present

## 2024-06-03 DIAGNOSIS — R252 Cramp and spasm: Secondary | ICD-10-CM | POA: Diagnosis not present

## 2024-06-03 NOTE — Progress Notes (Signed)
  Subjective:  Patient ID: Duane Hall, male    DOB: [birth year],  MRN: [id number]  Chief Complaint  Patient presents with   Wound Check    Rm2/ follow up wound check/diabetic blood sugar 190/ patient says he is doing well.    68 y.o. male presents with the above complaint. History confirmed with patient.    Objective:  Physical Exam: Left foot is warm and there is nonpalpable pulses he does have edema of the lower extremity ulceration of the tip of the second toe has nearly fully healed with a dorsal scab.  Surrounding hyperkeratosis.     Culture with skin flora   Assessment:   1. Skin ulcer of toe of left foot, limited to breakdown of skin (HCC)      Plan:  Patient was evaluated and treated and all questions answered.  Ulcer left second toe - Doing much better had a small scab today.  Did not require further debridement.  Discussed risk of recurrence and he will notify me if this worsens.  Advised to plantar plate about appointment daily until he heals.   Return if symptoms worsen or fail to improve.

## 2024-06-06 DIAGNOSIS — Z992 Dependence on renal dialysis: Secondary | ICD-10-CM | POA: Diagnosis not present

## 2024-06-06 DIAGNOSIS — D631 Anemia in chronic kidney disease: Secondary | ICD-10-CM | POA: Diagnosis not present

## 2024-06-06 DIAGNOSIS — R252 Cramp and spasm: Secondary | ICD-10-CM | POA: Diagnosis not present

## 2024-06-06 DIAGNOSIS — N2581 Secondary hyperparathyroidism of renal origin: Secondary | ICD-10-CM | POA: Diagnosis not present

## 2024-06-06 DIAGNOSIS — N186 End stage renal disease: Secondary | ICD-10-CM | POA: Diagnosis not present

## 2024-06-06 DIAGNOSIS — I953 Hypotension of hemodialysis: Secondary | ICD-10-CM | POA: Diagnosis not present

## 2024-06-07 DIAGNOSIS — Z992 Dependence on renal dialysis: Secondary | ICD-10-CM | POA: Diagnosis not present

## 2024-06-07 DIAGNOSIS — N186 End stage renal disease: Secondary | ICD-10-CM | POA: Diagnosis not present

## 2024-06-07 DIAGNOSIS — N2581 Secondary hyperparathyroidism of renal origin: Secondary | ICD-10-CM | POA: Diagnosis not present

## 2024-06-07 DIAGNOSIS — I953 Hypotension of hemodialysis: Secondary | ICD-10-CM | POA: Diagnosis not present

## 2024-06-07 DIAGNOSIS — R252 Cramp and spasm: Secondary | ICD-10-CM | POA: Diagnosis not present

## 2024-06-07 DIAGNOSIS — D631 Anemia in chronic kidney disease: Secondary | ICD-10-CM | POA: Diagnosis not present

## 2024-06-09 DIAGNOSIS — N2581 Secondary hyperparathyroidism of renal origin: Secondary | ICD-10-CM | POA: Diagnosis not present

## 2024-06-09 DIAGNOSIS — N186 End stage renal disease: Secondary | ICD-10-CM | POA: Diagnosis not present

## 2024-06-09 DIAGNOSIS — D631 Anemia in chronic kidney disease: Secondary | ICD-10-CM | POA: Diagnosis not present

## 2024-06-09 DIAGNOSIS — I953 Hypotension of hemodialysis: Secondary | ICD-10-CM | POA: Diagnosis not present

## 2024-06-09 DIAGNOSIS — R252 Cramp and spasm: Secondary | ICD-10-CM | POA: Diagnosis not present

## 2024-06-09 DIAGNOSIS — Z992 Dependence on renal dialysis: Secondary | ICD-10-CM | POA: Diagnosis not present

## 2024-06-10 DIAGNOSIS — I953 Hypotension of hemodialysis: Secondary | ICD-10-CM | POA: Diagnosis not present

## 2024-06-10 DIAGNOSIS — Z992 Dependence on renal dialysis: Secondary | ICD-10-CM | POA: Diagnosis not present

## 2024-06-10 DIAGNOSIS — R252 Cramp and spasm: Secondary | ICD-10-CM | POA: Diagnosis not present

## 2024-06-10 DIAGNOSIS — D631 Anemia in chronic kidney disease: Secondary | ICD-10-CM | POA: Diagnosis not present

## 2024-06-10 DIAGNOSIS — N2581 Secondary hyperparathyroidism of renal origin: Secondary | ICD-10-CM | POA: Diagnosis not present

## 2024-06-10 DIAGNOSIS — N186 End stage renal disease: Secondary | ICD-10-CM | POA: Diagnosis not present

## 2024-06-14 DIAGNOSIS — I953 Hypotension of hemodialysis: Secondary | ICD-10-CM | POA: Diagnosis not present

## 2024-06-14 DIAGNOSIS — Z992 Dependence on renal dialysis: Secondary | ICD-10-CM | POA: Diagnosis not present

## 2024-06-14 DIAGNOSIS — N2581 Secondary hyperparathyroidism of renal origin: Secondary | ICD-10-CM | POA: Diagnosis not present

## 2024-06-14 DIAGNOSIS — D631 Anemia in chronic kidney disease: Secondary | ICD-10-CM | POA: Diagnosis not present

## 2024-06-14 DIAGNOSIS — R252 Cramp and spasm: Secondary | ICD-10-CM | POA: Diagnosis not present

## 2024-06-14 DIAGNOSIS — N186 End stage renal disease: Secondary | ICD-10-CM | POA: Diagnosis not present

## 2024-06-16 DIAGNOSIS — D631 Anemia in chronic kidney disease: Secondary | ICD-10-CM | POA: Diagnosis not present

## 2024-06-16 DIAGNOSIS — N2581 Secondary hyperparathyroidism of renal origin: Secondary | ICD-10-CM | POA: Diagnosis not present

## 2024-06-16 DIAGNOSIS — R252 Cramp and spasm: Secondary | ICD-10-CM | POA: Diagnosis not present

## 2024-06-16 DIAGNOSIS — Z992 Dependence on renal dialysis: Secondary | ICD-10-CM | POA: Diagnosis not present

## 2024-06-16 DIAGNOSIS — N186 End stage renal disease: Secondary | ICD-10-CM | POA: Diagnosis not present

## 2024-06-16 DIAGNOSIS — I953 Hypotension of hemodialysis: Secondary | ICD-10-CM | POA: Diagnosis not present

## 2024-06-18 DIAGNOSIS — D631 Anemia in chronic kidney disease: Secondary | ICD-10-CM | POA: Diagnosis not present

## 2024-06-18 DIAGNOSIS — N186 End stage renal disease: Secondary | ICD-10-CM | POA: Diagnosis not present

## 2024-06-18 DIAGNOSIS — Z992 Dependence on renal dialysis: Secondary | ICD-10-CM | POA: Diagnosis not present

## 2024-06-18 DIAGNOSIS — I953 Hypotension of hemodialysis: Secondary | ICD-10-CM | POA: Diagnosis not present

## 2024-06-18 DIAGNOSIS — N2581 Secondary hyperparathyroidism of renal origin: Secondary | ICD-10-CM | POA: Diagnosis not present

## 2024-06-18 DIAGNOSIS — R252 Cramp and spasm: Secondary | ICD-10-CM | POA: Diagnosis not present

## 2024-06-20 DIAGNOSIS — Z992 Dependence on renal dialysis: Secondary | ICD-10-CM | POA: Diagnosis not present

## 2024-06-20 DIAGNOSIS — D631 Anemia in chronic kidney disease: Secondary | ICD-10-CM | POA: Diagnosis not present

## 2024-06-20 DIAGNOSIS — N186 End stage renal disease: Secondary | ICD-10-CM | POA: Diagnosis not present

## 2024-06-20 DIAGNOSIS — N2581 Secondary hyperparathyroidism of renal origin: Secondary | ICD-10-CM | POA: Diagnosis not present

## 2024-06-20 DIAGNOSIS — R252 Cramp and spasm: Secondary | ICD-10-CM | POA: Diagnosis not present

## 2024-06-20 DIAGNOSIS — I953 Hypotension of hemodialysis: Secondary | ICD-10-CM | POA: Diagnosis not present

## 2024-06-21 DIAGNOSIS — Z992 Dependence on renal dialysis: Secondary | ICD-10-CM | POA: Diagnosis not present

## 2024-06-21 DIAGNOSIS — I953 Hypotension of hemodialysis: Secondary | ICD-10-CM | POA: Diagnosis not present

## 2024-06-21 DIAGNOSIS — D631 Anemia in chronic kidney disease: Secondary | ICD-10-CM | POA: Diagnosis not present

## 2024-06-21 DIAGNOSIS — N2581 Secondary hyperparathyroidism of renal origin: Secondary | ICD-10-CM | POA: Diagnosis not present

## 2024-06-21 DIAGNOSIS — N186 End stage renal disease: Secondary | ICD-10-CM | POA: Diagnosis not present

## 2024-06-21 DIAGNOSIS — R252 Cramp and spasm: Secondary | ICD-10-CM | POA: Diagnosis not present

## 2024-06-22 DIAGNOSIS — D631 Anemia in chronic kidney disease: Secondary | ICD-10-CM | POA: Diagnosis not present

## 2024-06-22 DIAGNOSIS — R252 Cramp and spasm: Secondary | ICD-10-CM | POA: Diagnosis not present

## 2024-06-22 DIAGNOSIS — I953 Hypotension of hemodialysis: Secondary | ICD-10-CM | POA: Diagnosis not present

## 2024-06-22 DIAGNOSIS — Z992 Dependence on renal dialysis: Secondary | ICD-10-CM | POA: Diagnosis not present

## 2024-06-22 DIAGNOSIS — N186 End stage renal disease: Secondary | ICD-10-CM | POA: Diagnosis not present

## 2024-06-22 DIAGNOSIS — N2581 Secondary hyperparathyroidism of renal origin: Secondary | ICD-10-CM | POA: Diagnosis not present

## 2024-06-23 DIAGNOSIS — D631 Anemia in chronic kidney disease: Secondary | ICD-10-CM | POA: Diagnosis not present

## 2024-06-23 DIAGNOSIS — Z992 Dependence on renal dialysis: Secondary | ICD-10-CM | POA: Diagnosis not present

## 2024-06-23 DIAGNOSIS — I953 Hypotension of hemodialysis: Secondary | ICD-10-CM | POA: Diagnosis not present

## 2024-06-23 DIAGNOSIS — N186 End stage renal disease: Secondary | ICD-10-CM | POA: Diagnosis not present

## 2024-06-23 DIAGNOSIS — N2581 Secondary hyperparathyroidism of renal origin: Secondary | ICD-10-CM | POA: Diagnosis not present

## 2024-06-23 DIAGNOSIS — R252 Cramp and spasm: Secondary | ICD-10-CM | POA: Diagnosis not present

## 2024-06-24 DIAGNOSIS — N186 End stage renal disease: Secondary | ICD-10-CM | POA: Diagnosis not present

## 2024-06-24 DIAGNOSIS — Z992 Dependence on renal dialysis: Secondary | ICD-10-CM | POA: Diagnosis not present

## 2024-06-24 DIAGNOSIS — N2581 Secondary hyperparathyroidism of renal origin: Secondary | ICD-10-CM | POA: Diagnosis not present

## 2024-06-24 DIAGNOSIS — D631 Anemia in chronic kidney disease: Secondary | ICD-10-CM | POA: Diagnosis not present

## 2024-06-24 DIAGNOSIS — I953 Hypotension of hemodialysis: Secondary | ICD-10-CM | POA: Diagnosis not present

## 2024-06-24 DIAGNOSIS — R252 Cramp and spasm: Secondary | ICD-10-CM | POA: Diagnosis not present

## 2024-06-27 DIAGNOSIS — D631 Anemia in chronic kidney disease: Secondary | ICD-10-CM | POA: Diagnosis not present

## 2024-06-27 DIAGNOSIS — Z992 Dependence on renal dialysis: Secondary | ICD-10-CM | POA: Diagnosis not present

## 2024-06-27 DIAGNOSIS — N2581 Secondary hyperparathyroidism of renal origin: Secondary | ICD-10-CM | POA: Diagnosis not present

## 2024-06-27 DIAGNOSIS — R252 Cramp and spasm: Secondary | ICD-10-CM | POA: Diagnosis not present

## 2024-06-27 DIAGNOSIS — N186 End stage renal disease: Secondary | ICD-10-CM | POA: Diagnosis not present

## 2024-06-27 DIAGNOSIS — I953 Hypotension of hemodialysis: Secondary | ICD-10-CM | POA: Diagnosis not present

## 2024-06-28 DIAGNOSIS — Z992 Dependence on renal dialysis: Secondary | ICD-10-CM | POA: Diagnosis not present

## 2024-06-28 DIAGNOSIS — E1129 Type 2 diabetes mellitus with other diabetic kidney complication: Secondary | ICD-10-CM | POA: Diagnosis not present

## 2024-06-28 DIAGNOSIS — N186 End stage renal disease: Secondary | ICD-10-CM | POA: Diagnosis not present

## 2024-07-29 DIAGNOSIS — N186 End stage renal disease: Secondary | ICD-10-CM | POA: Diagnosis not present

## 2024-07-29 DIAGNOSIS — Z992 Dependence on renal dialysis: Secondary | ICD-10-CM | POA: Diagnosis not present

## 2024-07-29 DIAGNOSIS — E1129 Type 2 diabetes mellitus with other diabetic kidney complication: Secondary | ICD-10-CM | POA: Diagnosis not present

## 2024-08-10 DIAGNOSIS — Z0000 Encounter for general adult medical examination without abnormal findings: Secondary | ICD-10-CM | POA: Diagnosis not present

## 2024-08-10 DIAGNOSIS — Z89511 Acquired absence of right leg below knee: Secondary | ICD-10-CM | POA: Diagnosis not present

## 2024-08-10 DIAGNOSIS — Z992 Dependence on renal dialysis: Secondary | ICD-10-CM | POA: Diagnosis not present

## 2024-08-10 DIAGNOSIS — K219 Gastro-esophageal reflux disease without esophagitis: Secondary | ICD-10-CM | POA: Diagnosis not present

## 2024-08-10 DIAGNOSIS — E1122 Type 2 diabetes mellitus with diabetic chronic kidney disease: Secondary | ICD-10-CM | POA: Diagnosis not present

## 2024-08-10 DIAGNOSIS — N186 End stage renal disease: Secondary | ICD-10-CM | POA: Diagnosis not present

## 2024-08-10 DIAGNOSIS — E785 Hyperlipidemia, unspecified: Secondary | ICD-10-CM | POA: Diagnosis not present

## 2024-08-10 DIAGNOSIS — Z87891 Personal history of nicotine dependence: Secondary | ICD-10-CM | POA: Diagnosis not present

## 2024-08-10 DIAGNOSIS — Z8739 Personal history of other diseases of the musculoskeletal system and connective tissue: Secondary | ICD-10-CM | POA: Diagnosis not present

## 2024-09-06 ENCOUNTER — Telehealth: Payer: Self-pay | Admitting: Podiatry

## 2024-09-06 ENCOUNTER — Ambulatory Visit (INDEPENDENT_AMBULATORY_CARE_PROVIDER_SITE_OTHER): Admitting: Podiatry

## 2024-09-06 ENCOUNTER — Other Ambulatory Visit: Payer: Self-pay | Admitting: Podiatry

## 2024-09-06 ENCOUNTER — Encounter: Payer: Self-pay | Admitting: Podiatry

## 2024-09-06 DIAGNOSIS — L97521 Non-pressure chronic ulcer of other part of left foot limited to breakdown of skin: Secondary | ICD-10-CM | POA: Diagnosis not present

## 2024-09-06 DIAGNOSIS — I739 Peripheral vascular disease, unspecified: Secondary | ICD-10-CM

## 2024-09-06 DIAGNOSIS — L02612 Cutaneous abscess of left foot: Secondary | ICD-10-CM

## 2024-09-06 MED ORDER — CEPHALEXIN 500 MG PO CAPS: 500.0000 mg | ORAL_CAPSULE | ORAL | 0 refills | Status: AC

## 2024-09-06 NOTE — Telephone Encounter (Signed)
Called Pt LM

## 2024-09-06 NOTE — Telephone Encounter (Signed)
 Pt called back he will come in tonight at 6:30 pm

## 2024-09-06 NOTE — Telephone Encounter (Signed)
 Pt called wanting to get in to see Dr.McDonald right away fo (abrasion on the let foot next to the pinky toe I was trying to schedule pt for 3:45 pm today the pt stated that he can not come in on today and the next appt. Was on Sept.  17 pt denied both of the appointments the next appointment was in Oct.7 pt. Got really rude and stated that he was not waiting until Oct . He does not qualify for the after hours clinic , I offered to add pt to wait list he did not want that  .He ask can I check with Dr. cesar

## 2024-09-06 NOTE — Progress Notes (Unsigned)
  Subjective:  Patient ID: Duane Hall, male    DOB: August 16, 1956,  MRN: [id number]  Chief Complaint  Patient presents with   Wound Check    Pt is here due to wound on left pinky toe, states he does not know when the wound became, wife noticed it and appointment was made, pt is a diabetic, concern due to other amputation to feet.    68 y.o. male presents with the above complaint. History confirmed with patient.  He is unsure what started it possibly started from pressure from compression stockings  Objective:  Physical Exam: Left foot is warm and there is nonpalpable pulses he does have edema of the lower extremity prior second toe ulcer has healed he has a new blister on the lateral distal fifth toe with purulent drainage and mild erythema around the toe     Culture with skin flora   Assessment:   1. Abscess of fifth toe, left   2. Skin ulcer of toe of left foot, limited to breakdown of skin (HCC)   3. PAD (peripheral artery disease) (HCC)       Plan:  Patient was evaluated and treated and all questions answered.  Ulcer left second toe Blister was prepped with Betadine and the roof culture of the fluid was taken and sent to [facility].  Placed him on Keflex  dosed for his dialysis sessions.  Apply Betadine to area daily.  Return in 2 weeks to reevaluate.   Return in about 2 weeks (around 09/20/2024) for wound care.

## 2024-09-10 LAB — HOUSE ACCOUNT TRACKING

## 2024-09-10 LAB — WOUND CULTURE
MICRO NUMBER:: 16936769
RESULT:: NO GROWTH
SPECIMEN QUALITY:: ADEQUATE

## 2024-09-18 ENCOUNTER — Encounter: Payer: Self-pay | Admitting: Podiatry

## 2024-09-21 ENCOUNTER — Ambulatory Visit (INDEPENDENT_AMBULATORY_CARE_PROVIDER_SITE_OTHER)

## 2024-09-21 VITALS — Ht 69.0 in | Wt 191.0 lb

## 2024-09-21 DIAGNOSIS — L97521 Non-pressure chronic ulcer of other part of left foot limited to breakdown of skin: Secondary | ICD-10-CM

## 2024-09-21 DIAGNOSIS — Z9889 Other specified postprocedural states: Secondary | ICD-10-CM

## 2024-09-21 DIAGNOSIS — I739 Peripheral vascular disease, unspecified: Secondary | ICD-10-CM

## 2024-09-21 DIAGNOSIS — S80822A Blister (nonthermal), left lower leg, initial encounter: Secondary | ICD-10-CM

## 2024-09-21 MED ORDER — MUPIROCIN 2 % EX OINT: 1.0000 | TOPICAL_OINTMENT | Freq: Two times a day (BID) | CUTANEOUS | 0 refills | Status: DC

## 2024-09-21 NOTE — Progress Notes (Signed)
  Subjective:  Patient ID: Duane Hall, male    DOB: 03-28-1956,  MRN: [id number]  Chief Complaint  Patient presents with   Diabetic Ulcer    diabetic Dr. Silva saw pt in reference to ucler he lanced and cleaned it. The toe is turning black also has a blister on the front of the top of foot from ankle to ankle    68 y.o. male presents with the above complaint. He was last seen on 09/06/24 when he had a blister drained and cultured. His culture did not grow any organisms. He had been placed on keflex  which he has now completed. History confirmed with patient.  He is unsure what started it possibly started from pressure from compression stockings. He came in early out of concern that toe was changing appearance. History of right BKA.  Separately, he has a new anterior ankle serous filled blister.   Objective:  Physical Exam: Left foot is warm and there is nonpalpable pulses he does have edema of the lower extremity prior second toe ulcer has healed he has a new blister on the lateral distal fifth toe that is just proximal to the one diagnosed at last visit. Bloody drainage present. Left anterior ankle has large serous filled blister without any signs of infection. No purulence, malodor, focal erythema, focal edema to either lesion.          Radiographic imaging: 3 weightbearing views of the left foot were taken today.  These were ordered as precautions to make sure there was no underlying osteomyelitis of the fifth digit or soft tissue emphysema. No cortical erosions, periosteal reaction, or soft tissue emphysema identified. No changes consistent with OM or aggressive infection noted. Absence of hallux.    Assessment:   1. Skin ulcer of toe of left foot, limited to breakdown of skin (HCC)   2. Friction blister of left lower extremity, initial encounter   3. PAD (peripheral artery disease)   4. Post-operative state        Plan:  Patient was evaluated and treated and all questions  answered.  Ulcer left second toe - Blood blister today was debrided, deroofed and hyperkeratotic tissue removed without incident. Debrided all necrotic, non-viable tissue sharply with 312 blade and tissue nipper. Down to the level of subQ. Hemostasis achieved and dressing with abx ointment and DSD applied.  -No underlying infection noted. Recommend daily dressing changes consisting of mupirocin  ointment covered with dry sterile dressing. I prescribed the mupirocin  to the patients pharmacy on record. I discussed the distal blister appears consistent with a friction blister, they will look for anything that may be causing friction/rubbing.   Serous filled blister anterior left ankle - Serous filled large blister that is intact. Discussed keeping it intact for as long as possible to allow skin deep to the blister to heal as much as possible prior to popping. Apply mupirocin  and dry sterile dressing once daily. Discussed that if it does pop to wash the area with warm soap water, dry off, then apply mupirocin  and dry sterile dressing. They express understanding.   DMII - Most recent A1c from 4 months ago is 5.9, continue good control  F/u in 2 weeks

## 2024-09-21 NOTE — Telephone Encounter (Signed)
 Patient will be seen in office today and has been advised.

## 2024-09-22 MED ORDER — MUPIROCIN 2 % EX OINT: 1.0000 | TOPICAL_OINTMENT | Freq: Every day | CUTANEOUS | 0 refills | Status: AC

## 2024-09-22 NOTE — Addendum Note (Signed)
 Addended by: MAGDALEN BARTER on: 09/22/2024 08:27 AM   Modules accepted: Orders

## 2024-09-28 ENCOUNTER — Encounter: Payer: Self-pay | Admitting: Podiatry

## 2024-09-28 ENCOUNTER — Ambulatory Visit (INDEPENDENT_AMBULATORY_CARE_PROVIDER_SITE_OTHER): Admitting: Podiatry

## 2024-09-28 VITALS — Ht 69.0 in | Wt 191.0 lb

## 2024-09-28 DIAGNOSIS — L97521 Non-pressure chronic ulcer of other part of left foot limited to breakdown of skin: Secondary | ICD-10-CM

## 2024-09-28 DIAGNOSIS — S80822D Blister (nonthermal), left lower leg, subsequent encounter: Secondary | ICD-10-CM

## 2024-09-28 NOTE — Progress Notes (Signed)
  Subjective:  Patient ID: Duane Hall, male    DOB: 01/31/1956,  MRN: [id number]  Chief Complaint  Patient presents with   Wound Check    Rm 3 Patient here L 5th toe ulcer minimal drainage, some redness and swelling.   Has blister across top of L foot formed  8 days ago and popped 2 days ago.  Diabetic A1c 5.9. Plavix  81 mg Asprin    68 y.o. male presents with the above complaint. History confirmed with patient.  He returns for follow-up, had a blood blister that developed last week on the fifth toe as well as the front of the ankle he saw Dr. Kennieth for this  Objective:  Physical Exam: Left foot is warm and there is nonpalpable pulses he does have edema of the lower extremity prior second toe ulcer has healed he has a small ulceration on the lateral fifth toe hemorrhagic blister proximal to this has healed and hemorrhagic bulla present on the anterior ankle still        Culture with skin flora   Assessment:   1. Skin ulcer of toe of left foot, limited to breakdown of skin (HCC)       Plan:  Patient was evaluated and treated and all questions answered.  Ulcer left second toe Both sites appear to be healing well but slowly.  Agree with Dr. Claudene plan of gentamicin  ointment around the wound beds and keeping clean and dry and allowing the bulla to stay intact until it heals secondarily.  Return 3 weeks to reevaluate.  Advised on signs symptoms of infection to notify me if this happens.   Return in about 3 weeks (around 10/19/2024) for wound care.

## 2024-10-06 ENCOUNTER — Telehealth: Payer: Self-pay

## 2024-10-06 NOTE — Telephone Encounter (Signed)
 Lilia from [facility] called requesting clarification on the prescription for mupirocin  ointment. The pharmacy telephone number is [phone].

## 2024-10-19 ENCOUNTER — Ambulatory Visit: Admitting: Podiatry

## 2024-10-19 VITALS — Ht 69.0 in | Wt 191.0 lb

## 2024-10-19 DIAGNOSIS — L97521 Non-pressure chronic ulcer of other part of left foot limited to breakdown of skin: Secondary | ICD-10-CM

## 2024-10-24 NOTE — Progress Notes (Signed)
  Subjective:  Patient ID: Duane Hall, male    DOB: 1956/01/29,  MRN: [id number]  Chief Complaint  Patient presents with   Wound Check    RM 2 Patient is here for skin ulcer of the left foot.    68 y.o. male presents with the above complaint. History confirmed with patient.  They note improvement  Objective:  Physical Exam: Left foot is warm and there is nonpalpable pulses he does have edema of the lower extremity anterior ankle abrasion has nearly fully healed left fifth toe ulceration is nearly fully healed        Culture with skin flora   Assessment:   1. Skin ulcer of toe of left foot, limited to breakdown of skin (HCC)        Plan:  Patient was evaluated and treated and all questions answered.  Ulcer left second toe Doing well.  Continue local wound care and may leave open to air utilize ointment alone.  Follow-up in 4 weeks to reevaluate hopefully should be healed by that point.   Return in about 4 weeks (around 11/16/2024) for wound care.

## 2024-11-04 NOTE — Progress Notes (Unsigned)
 Name: Duane Hall  Age/ Sex: 68 y.o., male   MRN/ DOB: [id number], 1956/03/20     PCP: Lazoff, Shawn P, DO   Reason for Endocrinology Evaluation: Type 2 Diabetes Mellitus  Initial Endocrine Consultative Visit: 08/16/2020    PATIENT IDENTIFIER: Mr. Kelten Enochs is a 68 y.o. male with a past medical history of HTN, ESRD on HD, PVD, asthma, gastroparesis. The patient has followed with Endocrinology clinic since 08/16/2020 for consultative assistance with management of his diabetes.  DIABETIC HISTORY:  Mr. Taite was diagnosed with DM 2005, and started insulin  therapy in 2021, omnipod started 12/2020. Intolerant to Trulicity due to gastroparesis. His hemoglobin A1c has ranged from 5.6% in 2023, peaking at 11.8% in 2016.  He was followed by Dr. Kassie on 2021 until 04/2022 SUBJECTIVE:   During the last visit (05/04/2024): A1c 5.9%     Today (11/04/2024): Mr. Casagrande is here for follow-up on diabetes management.SABRA  He checks his blood sugars multiple times daily, through CGM.  The patient has  not had hypoglycemic episodes since the last clinic visit.   S/P left great toe amputation 01/09/2023  Continues to follow-up with podiatry 10/19/2024 for a left foot ulceration Patient continues to follow-up with vascular surgery for peripheral vascular disease, he is s/p angiogram/angioplasty 11/2023  He continues on home dialysis Sunday, Monday, Thursday, and Wednesday  Continues with chronic diarrhea   This patient with type 2 diabetes is treated with OmniPod 5 (insulin pump). During the visit the pump basal and bolus doses were reviewed including carb/insulin rations and supplemental doses. The clinical list was updated. The glucose meter download was reviewed in detail to determine if the current pump settings are providing the best glycemic control without excessive hypoglycemia.  Pump and meter download:       Pump   OmniPod 5  Settings   Insulin type   Humalog    Basal rate           0000 0.15u/h     0600 0.15 u/h     0900 0.35    2100 0.15 u/h          I:C ratio          0000  1:5                              Sensitivity          0000  40         Goal          00 00  110             Type & Model of Pump: OmniPod 5 Insulin  Type: Currently using Humalog .  There is no height or weight on file to calculate BMI.  PUMP STATISTICS: Average BG: 169 Average Daily Carbs (g): 45.5 Average Total Daily Insulin : 18 Average Daily Basal: 10.3 (57 %) Average Daily Bolus: 7.7 (43 %)    CONTINUOUS GLUCOSE MONITORING RECORD INTERPRETATION    Dates of Recording: 4/23-05/04/2024  Sensor description:dexcom  Results statistics:   CGM use % of time 79.2  Average and SD 169/38  Time in range 69 %  % Time Above 180 27  % Time above 250 4  % Time Below target 0   Glycemic patterns summary: Optimal overnight and Fluctuate during the day Hyperglycemic episodes  postprandial   Hypoglycemic episodes occurred N/A  Overnight periods: Optimal   HOME DIABETES REGIMEN:  Humalog   Statin: Yes ACE-I/ARB: no    DIABETIC COMPLICATIONS: Microvascular complications:  Proliferative DR ( S/P laser) , ESRD on dialysis ,Right BKA , left great toe amputation Last Eye Exam: Completed 2022   Macrovascular complications:  PVD Denies: CAD, CVA   HISTORY:  Past Medical History:  Past Medical History:  Diagnosis Date   Asthma    Cancer (HCC)    Cataract    CKD (chronic kidney disease), stage III (HCC)    Dialysis S-M-W-Th at [facility]   Diabetes mellitus    Glaucoma    Hepatitis    Hep C   Hypertension    no longer   Osteomyelitis of great toe of left foot [facility])    Vascular insufficiency 05/2020   Past Surgical History:  Past Surgical History:  Procedure Laterality Date   A/V FISTULAGRAM N/A 12/08/2023   Procedure: A/V Fistulagram;  Surgeon: Melia Lynwood ORN, MD;  Location: [facility] INVASIVE CV LAB;  Service: Cardiovascular;  Laterality: N/A;   ABDOMINAL  AORTOGRAM W/LOWER EXTREMITY N/A 06/19/2020   Procedure: ABDOMINAL AORTOGRAM W/LOWER EXTREMITY;  Surgeon: Sheree Penne Bruckner, MD;  Location: [facility] INVASIVE CV LAB;  Service: Cardiovascular;  Laterality: N/A;   ABDOMINAL AORTOGRAM W/LOWER EXTREMITY Left 10/16/2020   Procedure: ABDOMINAL AORTOGRAM W/LOWER EXTREMITY;  Surgeon: Sheree Penne Bruckner, MD;  Location: [facility] INVASIVE CV LAB;  Service: Cardiovascular;  Laterality: Left;   ABDOMINAL AORTOGRAM W/LOWER EXTREMITY N/A 12/25/2020   Procedure: ABDOMINAL AORTOGRAM W/LOWER EXTREMITY;  Surgeon: Sheree Penne Bruckner, MD;  Location: [facility] INVASIVE CV LAB;  Service: Cardiovascular;  Laterality: N/A;   ABDOMINAL AORTOGRAM W/LOWER EXTREMITY N/A 10/21/2022   Procedure: ABDOMINAL AORTOGRAM W/LOWER EXTREMITY;  Surgeon: Sheree Penne Bruckner, MD;  Location: [facility] INVASIVE CV LAB;  Service: Cardiovascular;  Laterality: N/A;   AMPUTATION Right 01/12/2021   Procedure: 1ST AMPUTATION RAY;  Surgeon: Silva Juliene SAUNDERS, DPM;  Location: [facility] OR;  Service: Podiatry;  Laterality: Right;   AMPUTATION Right 03/13/2021   Procedure: RIGHT BELOW KNEE AMPUTATION;  Surgeon: Sheree Penne Bruckner, MD;  Location: [facility] OR;  Service: Vascular;  Laterality: Right;   AMPUTATION TOE Left 11/08/2022   Procedure: AMPUTATION TOE;  Surgeon: Silva Juliene SAUNDERS, DPM;  Location: [facility] ORS;  Service: Podiatry;  Laterality: Left;  DR MCDONALD WILL BLOCK, ON STRETCHER   AMPUTATION TOE Left 01/09/2023   Procedure: AMPUTATION OF REMAINING LEFT GREAT TOE AND BONE BEHIND TOE;  Surgeon: Silva Juliene SAUNDERS, DPM;  Location: [facility] OR;  Service: Podiatry;  Laterality: Left;   AV FISTULA PLACEMENT Right 09/14/2019   Procedure: Right Arm Basilic Vein transposition;  Surgeon: Eliza Bruckner RAMAN, MD;  Location: [facility] OR;  Service: Vascular;  Laterality: Right;   BONE BIOPSY Left 07/29/2020   Procedure: BONE BIOPSY;  Surgeon: Silva Juliene SAUNDERS, DPM;  Location: [facility] OR;  Service: Podiatry;  Laterality: Left;   Need bone trephines and/or large bore Giamshidi   COLONOSCOPY     COLONOSCOPY WITH PROPOFOL  N/A 12/07/2021   Procedure: COLONOSCOPY WITH PROPOFOL ;  Surgeon: Rollin Dover, MD;  Location: [facility] ENDOSCOPY;  Service: Endoscopy;  Laterality: N/A;   PERIPHERAL VASCULAR ATHERECTOMY Left 06/19/2020   Procedure: PERIPHERAL VASCULAR ATHERECTOMY;  Surgeon: Sheree Penne Bruckner, MD;  Location: [facility] INVASIVE CV LAB;  Service: Cardiovascular;  Laterality: Left;  SFA   PERIPHERAL VASCULAR ATHERECTOMY  12/25/2020   Procedure: PERIPHERAL VASCULAR ATHERECTOMY;  Surgeon: Sheree Penne Bruckner, MD;  Location: [facility] INVASIVE CV LAB;  Service: Cardiovascular;;  Lt. PT - Laser Lt. SFA - Laser   PERIPHERAL VASCULAR  BALLOON ANGIOPLASTY  12/25/2020   Procedure: PERIPHERAL VASCULAR BALLOON ANGIOPLASTY;  Surgeon: Sheree Penne Bruckner, MD;  Location: [facility] INVASIVE CV LAB;  Service: Cardiovascular;;  Lt. SFA and PT   PERIPHERAL VASCULAR BALLOON ANGIOPLASTY Left 10/21/2022   Procedure: PERIPHERAL VASCULAR BALLOON ANGIOPLASTY;  Surgeon: Sheree Penne Bruckner, MD;  Location: [facility] INVASIVE CV LAB;  Service: Cardiovascular;  Laterality: Left;   PERIPHERAL VASCULAR BALLOON ANGIOPLASTY Right 12/08/2023   Procedure: PERIPHERAL VASCULAR BALLOON ANGIOPLASTY;  Surgeon: Melia Lynwood ORN, MD;  Location: [facility] INVASIVE CV LAB;  Service: Cardiovascular;  Laterality: Right;   PERIPHERAL VASCULAR INTERVENTION Left 10/21/2022   Procedure: PERIPHERAL VASCULAR INTERVENTION;  Surgeon: Sheree Penne Bruckner, MD;  Location: [facility] INVASIVE CV LAB;  Service: Cardiovascular;  Laterality: Left;   POLYPECTOMY  12/07/2021   Procedure: POLYPECTOMY;  Surgeon: Rollin Dover, MD;  Location: [facility] ENDOSCOPY;  Service: Endoscopy;;   UPPER GASTROINTESTINAL ENDOSCOPY  02/2019   Dr Rollin     WISDOM TOOTH EXTRACTION     Social History:  reports that he has been smoking cigarettes. He has never used smokeless tobacco. He reports that he does not drink alcohol and  does not use drugs. Family History:  Family History  Problem Relation Age of Onset   Cancer Father    Diabetes Mother      HOME MEDICATIONS: Allergies as of 11/05/2024       Reactions   Amoxicillin -pot Clavulanate Diarrhea   Midodrine  Other (See Comments)   Other reaction(s): Urinary Sensation   Tape Other (See Comments)   Latex band aids cause blistering        Medication List        Accurate as of November 04, 2024  1:37 PM. If you have any questions, ask your nurse or doctor.          aspirin  EC 81 MG tablet Take 1 tablet (81 mg total) by mouth daily. Swallow whole.   cholestyramine light 4 g packet Commonly known as: PREVALITE AS DIRECTED ORALLY 2 TIMES A DAY for 30 DAY(S)   clopidogrel  75 MG tablet Commonly known as: PLAVIX  Take 1 tablet (75 mg total) by mouth daily with breakfast.   Dexcom G7 Sensor Misc CHANGE SENSOR EVERY 10 DAYS OR AS DIRECTED BY PROVIDER   insulin  lispro 100 UNIT/ML injection Commonly known as: HumaLOG  FOR USE IN PUMP, TOTAL OF 50 UNITS DAILY   MIRCERA IJ Inject into the skin.   mupirocin  ointment 2 % Commonly known as: BACTROBAN  Apply 1 Application topically daily. Recommend 30 g tube for 30 day supply of mupirocin  to apply to wound on outside of foot and on blister once daily. Cover with gauze.   Omnipod 5 DexG7G6 Pods Gen 5 Misc 1 Device by Does not apply route every 3 (three) days.   Omnipod 5 DexG7G6 Pods Gen 5 Misc Change pod every 3 days   Omnipod 5 DexG7G6 Intro Gen 5 Kit Change pod every 3 days   pantoprazole  40 MG tablet Commonly known as: PROTONIX  Take 1 tablet (40 mg total) by mouth daily.   rosuvastatin  10 MG tablet Commonly known as: CRESTOR  Take 1 tablet (10 mg total) by mouth daily.         OBJECTIVE:   Vital Signs: There were no vitals taken for this visit.  Wt Readings from Last 3 Encounters:  10/19/24 191 lb (86.6 kg)  09/28/24 191 lb (86.6 kg)  09/21/24 191 lb (86.6 kg)      Exam: General: Pt appears well and is  in NAD  Lungs: Clear with good BS bilat   Heart: RRR   Extremities: Right BKA   Neuro: MS is good with appropriate affect, pt is alert and Ox3    DM Foot Exam 02/24/2024 per podiatry       DATA REVIEWED:  Lab Results  Component Value Date   HGBA1C 5.9 (A) 05/04/2024   HGBA1C 6.3 (A) 10/31/2023   HGBA1C 5.6 04/29/2023    Latest Reference Range & Units 05/04/24 12:44  Total CHOL/HDL Ratio <5.0 (calc) 2.4  Cholesterol <200 mg/dL 91  HDL Cholesterol > OR = 40 mg/dL 38 (L)  LDL Cholesterol (Calc) mg/dL (calc) 38  Non-HDL Cholesterol (Calc) <130 mg/dL (calc) 53  Triglycerides <150 mg/dL 72  (L): Data is abnormally low     ASSESSMENT / PLAN / RECOMMENDATIONS:   1) Type 2 Diabetes Mellitus, optimally controlled, With retinopathic complications, ESRD on HD and S/P BKA- Most recent A1c of 5.9 %. Goal A1c < 7.0 %.     - A1c skewed due to ESRD - His GMI based on CGM download ~ 7.5% -Patient has been noted with hypoglycemia overnight, I will increase his basal rate as below starting at midnight -Patient encouraged to continue to bolus specially at night -No other changes   MEDICATIONS: Humalog      Pump   OmniPod 5  Settings   Insulin  type   Humalog    Basal rate       0000 0.15 u/h    0600 0.15 u/h    0900 0.35   2100 0.15 u/h       I:C ratio       0000  1:5                  Sensitivity       0000  40      Goal       0000  110          EDUCATION / INSTRUCTIONS: BG monitoring instructions: Patient is instructed to check his blood sugars 3 times a day, before each meal . Call [facility] if: BG persistently < 70  I reviewed the Rule of 15 for the treatment of hypoglycemia in detail with the patient. Literature supplied.    2) Diabetic complications:  Eye: Does  have known diabetic retinopathy.  Neuro/ Feet: Does not have known diabetic peripheral neuropathy .  Renal: Patient does  have  known baseline CKD. He   is not on an ACEI/ARB at present.   3) Dyslipidemia :  - LDL and TG at goal - Patient rosuvastatin  10 mg daily   F/U in 6 months     Signed electronically by: Stefano Redgie Butts, MD  [facility] Endocrinology  [facility] [address] [place] [place], [place] [address] Phone: [phone] FAX: [phone]   CC: Macarthur Elouise SQUIBB, DO [address] [place] [address] Phone: [phone]  Fax: [phone]  Return to Endocrinology clinic as below: Future Appointments  Date Time Provider Department Center  11/05/2024 11:10 AM Tinesha Siegrist, Donell Redgie, MD [facility] None  11/16/2024  4:15 PM McDonald, Juliene SAUNDERS, DPM [facility]

## 2024-11-05 ENCOUNTER — Encounter: Payer: Self-pay | Admitting: Internal Medicine

## 2024-11-05 ENCOUNTER — Ambulatory Visit: Admitting: Internal Medicine

## 2024-11-05 VITALS — BP 148/60 | Ht 69.0 in | Wt 177.0 lb

## 2024-11-05 DIAGNOSIS — N186 End stage renal disease: Secondary | ICD-10-CM

## 2024-11-05 DIAGNOSIS — Z794 Long term (current) use of insulin: Secondary | ICD-10-CM

## 2024-11-05 DIAGNOSIS — E785 Hyperlipidemia, unspecified: Secondary | ICD-10-CM

## 2024-11-05 DIAGNOSIS — S98112A Complete traumatic amputation of left great toe, initial encounter: Secondary | ICD-10-CM

## 2024-11-05 DIAGNOSIS — Z992 Dependence on renal dialysis: Secondary | ICD-10-CM

## 2024-11-05 DIAGNOSIS — E113593 Type 2 diabetes mellitus with proliferative diabetic retinopathy without macular edema, bilateral: Secondary | ICD-10-CM

## 2024-11-05 DIAGNOSIS — E1122 Type 2 diabetes mellitus with diabetic chronic kidney disease: Secondary | ICD-10-CM

## 2024-11-05 DIAGNOSIS — Z89511 Acquired absence of right leg below knee: Secondary | ICD-10-CM

## 2024-11-05 LAB — POCT GLYCOSYLATED HEMOGLOBIN (HGB A1C): Hemoglobin A1C: 5.3 % (ref 4.0–5.6)

## 2024-11-05 MED ORDER — DEXCOM G7 SENSOR MISC: 1.0000 | 3 refills | Status: AC

## 2024-11-05 MED ORDER — INSULIN LISPRO 100 UNIT/ML IJ SOLN: INTRAMUSCULAR | 3 refills | Status: AC

## 2024-11-05 MED ORDER — OMNIPOD 5 G7 PODS (GEN 5) MISC
1.0000 | 3 refills | Status: AC
Start: 2024-11-05 — End: ?

## 2024-11-05 MED ORDER — ROSUVASTATIN CALCIUM 10 MG PO TABS
10.0000 mg | ORAL_TABLET | Freq: Every day | ORAL | 3 refills | Status: AC
Start: 2024-11-05 — End: ?

## 2024-11-05 NOTE — Patient Instructions (Signed)

## 2024-11-08 ENCOUNTER — Encounter: Payer: Self-pay | Admitting: Podiatry

## 2024-11-09 MED ORDER — GENTAMICIN SULFATE 0.1 % EX OINT: 1.0000 | TOPICAL_OINTMENT | Freq: Every day | CUTANEOUS | 0 refills | Status: AC

## 2024-11-16 ENCOUNTER — Ambulatory Visit (INDEPENDENT_AMBULATORY_CARE_PROVIDER_SITE_OTHER): Admitting: Podiatry

## 2024-11-16 DIAGNOSIS — L97521 Non-pressure chronic ulcer of other part of left foot limited to breakdown of skin: Secondary | ICD-10-CM | POA: Diagnosis not present

## 2024-11-18 NOTE — Progress Notes (Signed)
  Subjective:  Patient ID: Duane Hall, male    DOB: Jun 16, 1956,  MRN: [id number]  No chief complaint on file.   68 y.o. male presents with the above complaint. History confirmed with patient.  They note improvement, had some drainage recently but is improved again  Objective:  Physical Exam: Left foot is warm and there is nonpalpable pulses he does have edema of the lower extremity anterior ankle abrasion has nearly fully healed left fifth toe ulceration is nearly fully healed, no worsening infection        Culture with skin flora   Assessment:   1. Skin ulcer of toe of left foot, limited to breakdown of skin (HCC)        Plan:  Patient was evaluated and treated and all questions answered.  Ulcer left second toe Doing well.  Continue local wound care and may leave open to air utilize ointment alone.  Does not need to bandage unless there is significant drainage.  I debrided the overlying hyperkeratotic skin.  Follow-up in 4 weeks to reevaluate hopefully should be healed by that point.   No follow-ups on file.

## 2024-12-02 ENCOUNTER — Encounter: Payer: Self-pay | Admitting: Internal Medicine

## 2024-12-11 ENCOUNTER — Inpatient Hospital Stay (HOSPITAL_COMMUNITY)
Admission: EM | Admit: 2024-12-11 | Discharge: 2024-12-21 | DRG: 871 | Disposition: A | Discharge status: Hospice | Attending: Family Medicine | Admitting: Family Medicine

## 2024-12-11 ENCOUNTER — Emergency Department (HOSPITAL_COMMUNITY)

## 2024-12-11 ENCOUNTER — Inpatient Hospital Stay (HOSPITAL_COMMUNITY)

## 2024-12-11 DIAGNOSIS — F419 Anxiety disorder, unspecified: Secondary | ICD-10-CM | POA: Diagnosis present

## 2024-12-11 DIAGNOSIS — J189 Pneumonia, unspecified organism: Principal | ICD-10-CM | POA: Diagnosis present

## 2024-12-11 DIAGNOSIS — Z89511 Acquired absence of right leg below knee: Secondary | ICD-10-CM

## 2024-12-11 DIAGNOSIS — I132 Hypertensive heart and chronic kidney disease with heart failure and with stage 5 chronic kidney disease, or end stage renal disease: Secondary | ICD-10-CM | POA: Diagnosis present

## 2024-12-11 DIAGNOSIS — Z515 Encounter for palliative care: Secondary | ICD-10-CM

## 2024-12-11 DIAGNOSIS — Z7902 Long term (current) use of antithrombotics/antiplatelets: Secondary | ICD-10-CM

## 2024-12-11 DIAGNOSIS — R627 Adult failure to thrive: Secondary | ICD-10-CM | POA: Diagnosis present

## 2024-12-11 DIAGNOSIS — Z992 Dependence on renal dialysis: Secondary | ICD-10-CM | POA: Diagnosis not present

## 2024-12-11 DIAGNOSIS — I739 Peripheral vascular disease, unspecified: Secondary | ICD-10-CM | POA: Diagnosis present

## 2024-12-11 DIAGNOSIS — Z91158 Patient's noncompliance with renal dialysis for other reason: Secondary | ICD-10-CM

## 2024-12-11 DIAGNOSIS — Z1152 Encounter for screening for COVID-19: Secondary | ICD-10-CM

## 2024-12-11 DIAGNOSIS — L8990 Pressure ulcer of unspecified site, unspecified stage: Secondary | ICD-10-CM

## 2024-12-11 DIAGNOSIS — Z8673 Personal history of transient ischemic attack (TIA), and cerebral infarction without residual deficits: Secondary | ICD-10-CM

## 2024-12-11 DIAGNOSIS — I700 Atherosclerosis of aorta: Secondary | ICD-10-CM | POA: Diagnosis present

## 2024-12-11 DIAGNOSIS — E1140 Type 2 diabetes mellitus with diabetic neuropathy, unspecified: Secondary | ICD-10-CM | POA: Diagnosis present

## 2024-12-11 DIAGNOSIS — G9341 Metabolic encephalopathy: Secondary | ICD-10-CM | POA: Diagnosis present

## 2024-12-11 DIAGNOSIS — Z66 Do not resuscitate: Secondary | ICD-10-CM | POA: Diagnosis present

## 2024-12-11 DIAGNOSIS — K759 Inflammatory liver disease, unspecified: Secondary | ICD-10-CM | POA: Diagnosis present

## 2024-12-11 DIAGNOSIS — L89151 Pressure ulcer of sacral region, stage 1: Secondary | ICD-10-CM | POA: Diagnosis present

## 2024-12-11 DIAGNOSIS — J45909 Unspecified asthma, uncomplicated: Secondary | ICD-10-CM | POA: Diagnosis present

## 2024-12-11 DIAGNOSIS — J438 Other emphysema: Secondary | ICD-10-CM | POA: Diagnosis present

## 2024-12-11 DIAGNOSIS — R451 Restlessness and agitation: Secondary | ICD-10-CM | POA: Diagnosis not present

## 2024-12-11 DIAGNOSIS — L89621 Pressure ulcer of left heel, stage 1: Secondary | ICD-10-CM | POA: Diagnosis present

## 2024-12-11 DIAGNOSIS — R6520 Severe sepsis without septic shock: Secondary | ICD-10-CM | POA: Diagnosis present

## 2024-12-11 DIAGNOSIS — N281 Cyst of kidney, acquired: Secondary | ICD-10-CM | POA: Diagnosis present

## 2024-12-11 DIAGNOSIS — K3184 Gastroparesis: Secondary | ICD-10-CM | POA: Diagnosis present

## 2024-12-11 DIAGNOSIS — D6959 Other secondary thrombocytopenia: Secondary | ICD-10-CM | POA: Diagnosis present

## 2024-12-11 DIAGNOSIS — F17290 Nicotine dependence, other tobacco product, uncomplicated: Secondary | ICD-10-CM | POA: Diagnosis present

## 2024-12-11 DIAGNOSIS — I120 Hypertensive chronic kidney disease with stage 5 chronic kidney disease or end stage renal disease: Secondary | ICD-10-CM | POA: Diagnosis not present

## 2024-12-11 DIAGNOSIS — E875 Hyperkalemia: Secondary | ICD-10-CM | POA: Diagnosis present

## 2024-12-11 DIAGNOSIS — E1151 Type 2 diabetes mellitus with diabetic peripheral angiopathy without gangrene: Secondary | ICD-10-CM | POA: Diagnosis present

## 2024-12-11 DIAGNOSIS — F17210 Nicotine dependence, cigarettes, uncomplicated: Secondary | ICD-10-CM | POA: Diagnosis present

## 2024-12-11 DIAGNOSIS — D631 Anemia in chronic kidney disease: Secondary | ICD-10-CM | POA: Diagnosis present

## 2024-12-11 DIAGNOSIS — E1143 Type 2 diabetes mellitus with diabetic autonomic (poly)neuropathy: Secondary | ICD-10-CM | POA: Diagnosis present

## 2024-12-11 DIAGNOSIS — E1122 Type 2 diabetes mellitus with diabetic chronic kidney disease: Secondary | ICD-10-CM | POA: Diagnosis present

## 2024-12-11 DIAGNOSIS — N186 End stage renal disease: Secondary | ICD-10-CM | POA: Diagnosis present

## 2024-12-11 DIAGNOSIS — E785 Hyperlipidemia, unspecified: Secondary | ICD-10-CM | POA: Diagnosis present

## 2024-12-11 DIAGNOSIS — N189 Chronic kidney disease, unspecified: Secondary | ICD-10-CM | POA: Diagnosis not present

## 2024-12-11 DIAGNOSIS — F32A Depression, unspecified: Secondary | ICD-10-CM | POA: Diagnosis present

## 2024-12-11 DIAGNOSIS — A419 Sepsis, unspecified organism: Principal | ICD-10-CM | POA: Diagnosis present

## 2024-12-11 DIAGNOSIS — Z833 Family history of diabetes mellitus: Secondary | ICD-10-CM

## 2024-12-11 DIAGNOSIS — E1169 Type 2 diabetes mellitus with other specified complication: Secondary | ICD-10-CM | POA: Diagnosis present

## 2024-12-11 DIAGNOSIS — E11319 Type 2 diabetes mellitus with unspecified diabetic retinopathy without macular edema: Secondary | ICD-10-CM | POA: Diagnosis present

## 2024-12-11 DIAGNOSIS — E876 Hypokalemia: Secondary | ICD-10-CM | POA: Diagnosis present

## 2024-12-11 DIAGNOSIS — I214 Non-ST elevation (NSTEMI) myocardial infarction: Secondary | ICD-10-CM

## 2024-12-11 DIAGNOSIS — Z5320 Procedure and treatment not carried out because of patient's decision for unspecified reasons: Secondary | ICD-10-CM | POA: Diagnosis present

## 2024-12-11 DIAGNOSIS — Z794 Long term (current) use of insulin: Secondary | ICD-10-CM

## 2024-12-11 DIAGNOSIS — Z7982 Long term (current) use of aspirin: Secondary | ICD-10-CM

## 2024-12-11 DIAGNOSIS — Z89412 Acquired absence of left great toe: Secondary | ICD-10-CM

## 2024-12-11 DIAGNOSIS — Z89422 Acquired absence of other left toe(s): Secondary | ICD-10-CM

## 2024-12-11 DIAGNOSIS — D696 Thrombocytopenia, unspecified: Secondary | ICD-10-CM | POA: Diagnosis present

## 2024-12-11 DIAGNOSIS — E119 Type 2 diabetes mellitus without complications: Secondary | ICD-10-CM | POA: Diagnosis not present

## 2024-12-11 DIAGNOSIS — I21A1 Myocardial infarction type 2: Secondary | ICD-10-CM | POA: Diagnosis present

## 2024-12-11 DIAGNOSIS — I5022 Chronic systolic (congestive) heart failure: Secondary | ICD-10-CM | POA: Diagnosis present

## 2024-12-11 DIAGNOSIS — R4182 Altered mental status, unspecified: Secondary | ICD-10-CM | POA: Diagnosis not present

## 2024-12-11 DIAGNOSIS — I350 Nonrheumatic aortic (valve) stenosis: Secondary | ICD-10-CM | POA: Diagnosis not present

## 2024-12-11 DIAGNOSIS — D638 Anemia in other chronic diseases classified elsewhere: Secondary | ICD-10-CM | POA: Diagnosis not present

## 2024-12-11 DIAGNOSIS — K7460 Unspecified cirrhosis of liver: Secondary | ICD-10-CM | POA: Diagnosis present

## 2024-12-11 DIAGNOSIS — Z9181 History of falling: Secondary | ICD-10-CM | POA: Diagnosis not present

## 2024-12-11 DIAGNOSIS — B1920 Unspecified viral hepatitis C without hepatic coma: Secondary | ICD-10-CM | POA: Diagnosis not present

## 2024-12-11 LAB — I-STAT CHEM 8, ED
BUN: 28 mg/dL — ABNORMAL HIGH (ref 8–23)
Calcium, Ion: 1.02 mmol/L — ABNORMAL LOW (ref 1.15–1.40)
Chloride: 94 mmol/L — ABNORMAL LOW (ref 98–111)
Creatinine, Ser: 7.3 mg/dL — ABNORMAL HIGH (ref 0.61–1.24)
Glucose, Bld: 149 mg/dL — ABNORMAL HIGH (ref 70–99)
HCT: 34 % — ABNORMAL LOW (ref 39.0–52.0)
Hemoglobin: 11.6 g/dL — ABNORMAL LOW (ref 13.0–17.0)
Potassium: 3.5 mmol/L (ref 3.5–5.1)
Sodium: 136 mmol/L (ref 135–145)
TCO2: 31 mmol/L (ref 22–32)

## 2024-12-11 LAB — CBC WITH DIFFERENTIAL/PLATELET
Abs Immature Granulocytes: 0.05 K/uL (ref 0.00–0.07)
Basophils Absolute: 0 K/uL (ref 0.0–0.1)
Basophils Relative: 0 %
Eosinophils Absolute: 0 K/uL (ref 0.0–0.5)
Eosinophils Relative: 0 %
HCT: 32.4 % — ABNORMAL LOW (ref 39.0–52.0)
Hemoglobin: 10.8 g/dL — ABNORMAL LOW (ref 13.0–17.0)
Immature Granulocytes: 1 %
Lymphocytes Relative: 6 %
Lymphs Abs: 0.5 K/uL — ABNORMAL LOW (ref 0.7–4.0)
MCH: 30 pg (ref 26.0–34.0)
MCHC: 33.3 g/dL (ref 30.0–36.0)
MCV: 90 fL (ref 80.0–100.0)
Monocytes Absolute: 0.4 K/uL (ref 0.1–1.0)
Monocytes Relative: 5 %
Neutro Abs: 7.2 K/uL (ref 1.7–7.7)
Neutrophils Relative %: 88 %
Platelets: 59 K/uL — ABNORMAL LOW (ref 150–400)
RBC: 3.6 MIL/uL — ABNORMAL LOW (ref 4.22–5.81)
RDW: 13.2 % (ref 11.5–15.5)
WBC: 8.1 K/uL (ref 4.0–10.5)
nRBC: 0 % (ref 0.0–0.2)

## 2024-12-11 LAB — COMPREHENSIVE METABOLIC PANEL WITH GFR
ALT: 9 U/L (ref 0–44)
AST: 23 U/L (ref 15–41)
Albumin: 3.6 g/dL (ref 3.5–5.0)
Alkaline Phosphatase: 54 U/L (ref 38–126)
Anion gap: 14 (ref 5–15)
BUN: 23 mg/dL (ref 8–23)
CO2: 29 mmol/L (ref 22–32)
Calcium: 8.9 mg/dL (ref 8.9–10.3)
Chloride: 95 mmol/L — ABNORMAL LOW (ref 98–111)
Creatinine, Ser: 7.15 mg/dL — ABNORMAL HIGH (ref 0.61–1.24)
GFR, Estimated: 8 mL/min — ABNORMAL LOW (ref >60)
Glucose, Bld: 151 mg/dL — ABNORMAL HIGH (ref 70–99)
Potassium: 3.5 mmol/L (ref 3.5–5.1)
Sodium: 138 mmol/L (ref 135–145)
Total Bilirubin: 1.1 mg/dL (ref 0.0–1.2)
Total Protein: 6 g/dL — ABNORMAL LOW (ref 6.5–8.1)

## 2024-12-11 LAB — I-STAT CG4 LACTIC ACID, ED
Lactic Acid, Venous: 2 mmol/L (ref 0.5–1.9)
Lactic Acid, Venous: 1.2 mmol/L (ref 0.5–1.9)

## 2024-12-11 LAB — PROTIME-INR
INR: 1.4 — ABNORMAL HIGH (ref 0.8–1.2)
Prothrombin Time: 17.7 s — ABNORMAL HIGH (ref 11.4–15.2)

## 2024-12-11 LAB — I-STAT VENOUS BLOOD GAS, ED
Acid-Base Excess: 8 mmol/L — ABNORMAL HIGH (ref 0.0–2.0)
Bicarbonate: 31.8 mmol/L — ABNORMAL HIGH (ref 20.0–28.0)
Calcium, Ion: 1.01 mmol/L — ABNORMAL LOW (ref 1.15–1.40)
HCT: 33 % — ABNORMAL LOW (ref 39.0–52.0)
Hemoglobin: 11.2 g/dL — ABNORMAL LOW (ref 13.0–17.0)
O2 Saturation: 100 %
Potassium: 3.5 mmol/L (ref 3.5–5.1)
Sodium: 134 mmol/L — ABNORMAL LOW (ref 135–145)
TCO2: 33 mmol/L — ABNORMAL HIGH (ref 22–32)
pCO2, Ven: 40 mmHg — ABNORMAL LOW (ref 44–60)
pH, Ven: 7.508 — ABNORMAL HIGH (ref 7.25–7.43)
pO2, Ven: 173 mmHg — ABNORMAL HIGH (ref 32–45)

## 2024-12-11 LAB — TROPONIN I (HIGH SENSITIVITY): Troponin I (High Sensitivity): 1507 ng/L (ref <18)

## 2024-12-11 LAB — CK: Total CK: 512 U/L — ABNORMAL HIGH (ref 49–397)

## 2024-12-11 LAB — CBG MONITORING, ED
Glucose-Capillary: 139 mg/dL — ABNORMAL HIGH (ref 70–99)
Glucose-Capillary: 130 mg/dL — ABNORMAL HIGH (ref 70–99)

## 2024-12-11 LAB — TYPE AND SCREEN
ABO/RH(D): O NEG
Antibody Screen: NEGATIVE

## 2024-12-11 LAB — RESP PANEL BY RT-PCR (RSV, FLU A&B, COVID)  RVPGX2
Influenza A by PCR: NEGATIVE
Influenza B by PCR: NEGATIVE
Resp Syncytial Virus by PCR: NEGATIVE
SARS Coronavirus 2 by RT PCR: NEGATIVE

## 2024-12-11 LAB — AMMONIA: Ammonia: 23 umol/L (ref 9–35)

## 2024-12-11 MED ORDER — PANTOPRAZOLE SODIUM 40 MG IV SOLR
40.0000 mg | Freq: Two times a day (BID) | INTRAVENOUS | Status: DC
  Administered 2024-12-11 – 2024-12-12 (×3): 40 mg via INTRAVENOUS
  Filled 2024-12-11 (×5): qty 10

## 2024-12-11 MED ORDER — METRONIDAZOLE 500 MG/100ML IV SOLN
500.0000 mg | Freq: Once | INTRAVENOUS | Status: AC
  Administered 2024-12-11: 500 mg via INTRAVENOUS
  Filled 2024-12-11: qty 100

## 2024-12-11 MED ORDER — CHLORHEXIDINE GLUCONATE CLOTH 2 % EX PADS
6.0000 | MEDICATED_PAD | Freq: Every day | CUTANEOUS | Status: DC
  Administered 2024-12-12: 6 via TOPICAL

## 2024-12-11 MED ORDER — ACETAMINOPHEN 650 MG RE SUPP
650.0000 mg | Freq: Once | RECTAL | Status: AC
  Administered 2024-12-11: 650 mg via RECTAL
  Filled 2024-12-11: qty 1

## 2024-12-11 MED ORDER — VANCOMYCIN HCL IN DEXTROSE 1-5 GM/200ML-% IV SOLN: 1000.0000 mg | Freq: Once | INTRAVENOUS | Status: DC

## 2024-12-11 MED ORDER — ONDANSETRON HCL 4 MG/2ML IJ SOLN
4.0000 mg | Freq: Once | INTRAMUSCULAR | Status: AC
  Administered 2024-12-11: 4 mg via INTRAVENOUS
  Filled 2024-12-11: qty 2

## 2024-12-11 MED ORDER — SODIUM CHLORIDE 0.9 % IV SOLN
2.0000 g | Freq: Once | INTRAVENOUS | Status: AC
  Administered 2024-12-11: 2 g via INTRAVENOUS
  Filled 2024-12-11: qty 12.5

## 2024-12-11 MED ORDER — IOHEXOL 350 MG/ML SOLN
75.0000 mL | Freq: Once | INTRAVENOUS | Status: AC | PRN
  Administered 2024-12-11: 75 mL via INTRAVENOUS

## 2024-12-11 MED ORDER — INSULIN ASPART 100 UNIT/ML IJ SOLN: 0.0000 [IU] | INTRAMUSCULAR | Status: DC | PRN

## 2024-12-11 MED ORDER — VANCOMYCIN HCL 750 MG/150ML IV SOLN: 750.0000 mg | INTRAVENOUS | Status: DC

## 2024-12-11 MED ORDER — SODIUM CHLORIDE 0.9 % IV SOLN: INTRAVENOUS | Status: DC

## 2024-12-11 MED ORDER — METRONIDAZOLE 500 MG/100ML IV SOLN
500.0000 mg | Freq: Two times a day (BID) | INTRAVENOUS | Status: DC
  Administered 2024-12-12 – 2024-12-13 (×3): 500 mg via INTRAVENOUS
  Filled 2024-12-11 (×5): qty 100

## 2024-12-11 MED ORDER — SODIUM CHLORIDE 0.9% FLUSH
3.0000 mL | Freq: Two times a day (BID) | INTRAVENOUS | Status: DC
  Administered 2024-12-12 (×3): 3 mL via INTRAVENOUS

## 2024-12-11 MED ORDER — VANCOMYCIN HCL 2000 MG/400ML IV SOLN
2000.0000 mg | Freq: Once | INTRAVENOUS | Status: AC
  Administered 2024-12-11: 2000 mg via INTRAVENOUS
  Filled 2024-12-11: qty 400

## 2024-12-11 MED ORDER — VANCOMYCIN HCL 750 MG/150ML IV SOLN
750.0000 mg | Freq: Once | INTRAVENOUS | Status: AC
  Administered 2024-12-12: 750 mg via INTRAVENOUS
  Filled 2024-12-11: qty 150

## 2024-12-11 MED ORDER — SODIUM CHLORIDE 0.9 % IV SOLN
1.0000 g | INTRAVENOUS | Status: DC
  Administered 2024-12-12: 1 g via INTRAVENOUS
  Filled 2024-12-11 (×3): qty 10

## 2024-12-11 MED ORDER — HEPARIN SODIUM (PORCINE) 1000 UNIT/ML IJ SOLN
INTRAMUSCULAR | Status: AC
  Filled 2024-12-11: qty 3

## 2024-12-11 NOTE — ED Notes (Signed)
 Insulin  pump removed per verbal order from hospitalist.

## 2024-12-11 NOTE — Progress Notes (Signed)
 Pharmacy Antibiotic Note  Duane Hall is a 68 y.o. male admitted on 12/11/2024 with concern for sepsis, foot wound.  Pharmacy has been consulted for vancomycin  dosing.  Vancomycin  2g IV x 1 given in ED  Plan: Vancomycin  750 mg IV qHD Monitor HD schedule, Cx and clinical progression to narrow Vancomycin  random level as needed     Temp (24hrs), Avg:100.4 F (38 C), Min:99.5 F (37.5 C), Max:101.3 F (38.5 C)  Recent Labs  Lab 12/11/24 1452 12/11/24 1505 12/11/24 1511 12/11/24 1812  WBC 8.1  --   --   --   CREATININE 7.15*  --  7.30*  --   LATICACIDVEN  --  2.0*  --  1.2    CrCl cannot be calculated (Unknown ideal weight.).    Allergies[1]  Dorn Poot, PharmD, [facility] Clinical Pharmacist ED Pharmacist Phone # [phone] 12/11/2024 7:38 PM     [1]  Allergies Allergen Reactions   Amoxicillin -Pot Clavulanate Diarrhea   Midodrine  Other (See Comments)    Other reaction(s): Urinary Sensation   Tape Other (See Comments)    Latex band aids cause blistering

## 2024-12-11 NOTE — Procedures (Signed)
 S: pt seen in HD unit. Coughing up some bloody phlegm w/ brownish/ yellow sputum. Pt has PNA and this can be expected. UF goal changed to 1.75 L, lowered due to his high trops.  So far tolerating well.   Vitals:   12/11/24 1811 12/11/24 2010 12/11/24 2027 12/11/24 2105  BP:  (!) 142/72 (!) 147/78 (!) 106/92  Pulse:   (!) 111 (!) 106  Resp:   (!) 25 (!) 22  Temp: 99.5 F (37.5 C) 98.8 F (37.1 C)    TempSrc: Temporal     SpO2:  95% 94% 97%    Recent Labs  Lab 12/11/24 1452 12/11/24 1511  HGB 10.8* 11.6*  11.2*  ALBUMIN  3.6  --   CALCIUM  8.9  --   CREATININE 7.15* 7.30*  K 3.5 3.5  3.5    Inpatient medications:  [START ON 12/12/2024] Chlorhexidine  Gluconate Cloth  6 each Topical Q0600   pantoprazole  (PROTONIX ) IV  40 mg Intravenous Q12H   sodium chloride  flush  3 mL Intravenous Q12H    sodium chloride      [START ON 12/12/2024] ceFEPime  (MAXIPIME ) IV     [START ON 12/12/2024] metronidazole      [START ON 12/14/2024] vancomycin      vancomycin      insulin  aspart  I was present at the procedure, reviewed the HD regimen and made appropriate changes.   Myer Fret MD  [facility] 12/11/2024, 9:58 PM

## 2024-12-11 NOTE — Progress Notes (Signed)
 Elink following for sepsis protocol.

## 2024-12-11 NOTE — ED Provider Notes (Signed)
 Duane Hall   CSN: [id number] Arrival date & time: 12/11/24  1441     Patient presents with: unresponsive   Duane Hall is a 68 y.o. male.   HPI 68 year old male presents with altered mental status and vomiting.  History is initially from EMS and then later from wife.  Patient is chronically severely ill with ESRD on home dialysis, hepatitis with cirrhosis, diabetes, and other significant comorbidities.  The patient went out yesterday and seemed maybe a little confused to the son.  Last night he went to bed early and seem to have some slurred speech and may be drawing up of his left arm.  Around 3 AM he was vomiting which is not atypical for him but then his wife found him unresponsive but thought he had just fallen asleep.  However he has never woken up since then.  He has had intermittent vomiting and she has tried to adjust him so he is not aspirating.  He has a MOST form and is DNR with only comfort measures versus IV antibiotics if they will help.  Patient is responsive minimally to painful stimuli with EMS, had a glucose in the 200s, and multiple episodes of emesis.  He was requiring 2 L of oxygen which is new.  Prior to Admission medications  Medication Sig Start Date End Date Taking? Authorizing Provider  aspirin  EC 81 MG EC tablet Take 1 tablet (81 mg total) by mouth daily. Swallow whole. 06/21/20   Odell Celinda Balo, MD  cholestyramine light (PREVALITE) 4 g packet AS DIRECTED ORALLY 2 TIMES A DAY for 30 DAY(S) 09/24/21   [provider]  clopidogrel  (PLAVIX ) 75 MG tablet Take 1 tablet (75 mg total) by mouth daily with breakfast. 06/21/20   Odell Celinda Balo, MD  Continuous Glucose Sensor (DEXCOM G7 SENSOR) MISC 1 Device by Other route as directed. 1 sensor every 10 days 11/05/24   Shamleffer, Ibtehal Jaralla, MD  gentamicin  ointment (GARAMYCIN ) 0.1 % Apply 1 Application topically daily. Apply to wound daily  11/09/24   McDonald, Juliene SAUNDERS, DPM  Insulin  Disposable Pump (OMNIPOD 5 G7 PODS, GEN 5,) MISC 1 Device by Does not apply route every other day. 11/05/24   Shamleffer, Ibtehal Jaralla, MD  insulin  lispro (HUMALOG ) 100 UNIT/ML injection FOR USE IN PUMP, TOTAL OF 50 UNITS DAILY 11/05/24   Shamleffer, Ibtehal Jaralla, MD  Methoxy PEG-Epoetin  Beta (MIRCERA IJ) Inject into the skin. 07/28/23   [provider]  mupirocin  ointment (BACTROBAN ) 2 % Apply 1 Application topically daily. Recommend 30 g tube for 30 day supply of mupirocin  to apply to wound on outside of foot and on blister once daily. Cover with gauze. 09/22/24   Magdalen Prentice PARAS, DPM  pantoprazole  (PROTONIX ) 40 MG tablet Take 1 tablet (40 mg total) by mouth daily. 03/17/21   Pokhrel, Laxman, MD  rosuvastatin  (CRESTOR ) 10 MG tablet Take 1 tablet (10 mg total) by mouth daily. 11/05/24   Shamleffer, Ibtehal Jaralla, MD    Allergies: Amoxicillin -pot clavulanate, Midodrine , and Tape    Review of Systems  Unable to perform ROS: Mental status change    Updated Vital Signs BP 137/72   Pulse (!) 114   Resp (!) 25   SpO2 97%   Physical Exam Vitals and nursing Hall reviewed.  Constitutional:      Appearance: He is well-developed. He is ill-appearing.  HENT:     Head: Normocephalic and atraumatic.  Eyes:  Comments: Pupils mid to small but minimally reactive bilaterally  Cardiovascular:     Rate and Rhythm: Regular rhythm. Tachycardia present.     Heart sounds: Normal heart sounds.  Pulmonary:     Effort: Pulmonary effort is normal.     Comments: Coarse breath sounds Abdominal:     Palpations: Abdomen is soft.  Skin:    General: Skin is warm and dry.  Neurological:     Mental Status: He is unresponsive.     Comments: Patient is unresponsive.  He will somewhat minimally move to painful stimuli.     (all labs ordered are listed, but only abnormal results are displayed) Labs Reviewed  I-STAT CHEM 8, ED - Abnormal; Notable for the  following components:      Result Value   Chloride 94 (*)    BUN 28 (*)    Creatinine, Ser 7.30 (*)    Glucose, Bld 149 (*)    Calcium , Ion 1.02 (*)    Hemoglobin 11.6 (*)    HCT 34.0 (*)    All other components within normal limits  CBG MONITORING, ED - Abnormal; Notable for the following components:   Glucose-Capillary 139 (*)    All other components within normal limits  I-STAT CG4 LACTIC ACID, ED - Abnormal; Notable for the following components:   Lactic Acid, Venous 2.0 (*)    All other components within normal limits  I-STAT VENOUS BLOOD GAS, ED - Abnormal; Notable for the following components:   pH, Ven 7.508 (*)    pCO2, Ven 40.0 (*)    pO2, Ven 173 (*)    Bicarbonate 31.8 (*)    TCO2 33 (*)    Acid-Base Excess 8.0 (*)    Sodium 134 (*)    Calcium , Ion 1.01 (*)    HCT 33.0 (*)    Hemoglobin 11.2 (*)    All other components within normal limits  RESP PANEL BY RT-PCR (RSV, FLU A&B, COVID)  RVPGX2  CULTURE, BLOOD (ROUTINE X 2)  CULTURE, BLOOD (ROUTINE X 2)  COMPREHENSIVE METABOLIC PANEL WITH GFR  CBC WITH DIFFERENTIAL/PLATELET  PROTIME-INR  URINALYSIS, W/ REFLEX TO CULTURE (INFECTION SUSPECTED)    EKG: None  Radiology: No results found.   .Critical Care  Performed by: Freddi Hamilton, MD Authorized by: Freddi Hamilton, MD   Critical care provider statement:    Critical care time (minutes):  35   Critical care time was exclusive of:  Separately billable procedures and treating other patients   Critical care was necessary to treat or prevent imminent or life-threatening deterioration of the following conditions:  Sepsis and CNS failure or compromise   Critical care was time spent personally by me on the following activities:  Development of treatment plan with patient or surrogate, discussions with consultants, evaluation of patient's response to treatment, examination of patient, ordering and review of laboratory studies, ordering and review of radiographic  studies, ordering and performing treatments and interventions, pulse oximetry, re-evaluation of patient's condition and review of old charts    Medications Ordered in the ED  ceFEPIme  (MAXIPIME ) 2 g in sodium chloride  0.9 % 100 mL IVPB (has no administration in time range)  metroNIDAZOLE  (FLAGYL ) IVPB 500 mg (has no administration in time range)  acetaminophen  (TYLENOL ) suppository 650 mg (has no administration in time range)  vancomycin  (VANCOREADY) IVPB 2000 mg/400 mL (has no administration in time range)  Medical Decision Making Amount and/or Complexity of Data Reviewed Labs: ordered. Radiology: ordered.  Risk OTC drugs. Prescription drug management.   Patient presents critically ill.  Long discussion with his wife, the patient would not want any type of heroic measures and maybe not even antibiotics depending on what is going on.  He is febrile and was given rectal Tylenol  as well as started on broad IV antibiotics.  Discussed that he will need a rediscussion with wife after labs, x-ray, CT come back to discuss whether they would like to pursue treatment with antibiotics and supportive care for sepsis versus comfort care.  Care transferred to Dr. Dasie.     Final diagnoses:  None    ED Discharge Orders     None          Freddi Hamilton, MD 12/11/24 1557

## 2024-12-11 NOTE — ED Notes (Signed)
 Patient returned from CT

## 2024-12-11 NOTE — H&P (Signed)
 History and Physical    Patient: Duane Hall FMW:[id number] DOB: 09-Sep-1956 DOA: 12/11/2024 DOS: the patient was seen and examined on 12/11/2024 . PCP: Lazoff, Shawn P, DO  Patient coming from: Home Chief complaint: Chief Complaint  Patient presents with   unresponsive   HPI:  Duane Hall is a 68 y.o. male with past medical history  of  hypertension, diabetes, hepatitis C, osteomyelitis of the left great toe status post amputation coming for AMS. Wife states at 5 pm yesterday he had a fall and in am she found him in wheelchair with vomitus on him and unresponsive .  Appetite is very poor he can barely eat a sandwich and keep anything down he vomits. States that he has had poor quality of life in the past few months and the patient has almost wanted to give up his hemodialysis.  States that today she wants him to be DNR and DNI according to his wishes and to proceed with IV antibiotics as needed she does want me to investigate with imaging and discussed with her about treatment options and she will let us  know if she and her family agree.    ED Course:  Vital signs in the ED were notable for the following:  Vitals:   12/11/24 1700 12/11/24 1715 12/11/24 1800 12/11/24 1811  BP: (!) 141/74 137/75 139/79   Pulse: (!) 114 (!) 113 (!) 110   Temp:    99.5 F (37.5 C)  Resp: (!) 27 (!) 26 (!) 21   SpO2: 95% 95% 97%   TempSrc:    Temporal   >>ED evaluation thus far shows:  - VBG shows pH of 7.508 pCO2 of 40 and O2 of 173. -Initial CMP showing glucose 151 normal potassium of 3.5 BUN of 23 and creatinine of 7.15 normal LFTs. -Ammonia of 23.  -Initial lactic of 2.0 repeat at 1.2.  -CBC shows white count of 8.1 hemoglobin of 10.8 platelets of 59.  - INR is 1.4.   - Respiratory panel is negative for flu RSV and COVID. -Chest x-ray done today shows pulmonary edema with small left pleural effusion and atypical infection consideration.  -CTA chest PE protocol, CT abdomen pelvis  with contrast, MRI of brain noncontrast pending and will follow. - EKG shows sinus tach at 103, PR 122 QTc of 444.  - 2012 2 d echo: 1. Left ventricular ejection fraction, by estimation, is 55 to 60%. The  left ventricle has normal function. The left ventricle has no regional  wall motion abnormalities. Left ventricular diastolic parameters are  indeterminate.   2. Right ventricular systolic function is normal. The right ventricular  size is normal.   3. Left atrial size was severely dilated.   4. The mitral valve is normal in structure. Mild mitral valve  regurgitation. No evidence of mitral stenosis.   5. The aortic valve is tricuspid. Aortic valve regurgitation is not  visualized. No aortic stenosis is present.   6. The inferior vena cava is normal in size with greater than 50%  respiratory variability, suggesting right atrial pressure of 3 mmHg.    >>While in the ED patient received the following: Medications  metroNIDAZOLE  (FLAGYL ) IVPB 500 mg (500 mg Intravenous New Bag/Given 12/11/24 1648)  vancomycin  (VANCOREADY) IVPB 2000 mg/400 mL (has no administration in time range)  ceFEPIme  (MAXIPIME ) 2 g in sodium chloride  0.9 % 100 mL IVPB (0 g Intravenous Stopped 12/11/24 1643)  acetaminophen  (TYLENOL ) suppository 650 mg (650 mg Rectal Given 12/11/24 1526)  ondansetron  (  ZOFRAN ) injection 4 mg (4 mg Intravenous Given 12/11/24 1646)   Review of Systems  Gastrointestinal:  Positive for diarrhea, nausea and vomiting.   Past Medical History:  Diagnosis Date   Asthma    Cancer (HCC)    Cataract    CKD (chronic kidney disease), stage III (HCC)    Dialysis [facility] at [facility]   Diabetes mellitus    Glaucoma    Hepatitis    Hep C   Hypertension    no longer   Osteomyelitis of great toe of left foot [facility])    Vascular insufficiency 05/2020   Past Surgical History:  Procedure Laterality Date   A/V FISTULAGRAM N/A 12/08/2023   Procedure: A/V Fistulagram;  Surgeon: Melia Lynwood ORN, MD;  Location: [facility] INVASIVE CV LAB;  Service: Cardiovascular;  Laterality: N/A;   ABDOMINAL AORTOGRAM W/LOWER EXTREMITY N/A 06/19/2020   Procedure: ABDOMINAL AORTOGRAM W/LOWER EXTREMITY;  Surgeon: Sheree Penne Bruckner, MD;  Location: [facility] INVASIVE CV LAB;  Service: Cardiovascular;  Laterality: N/A;   ABDOMINAL AORTOGRAM W/LOWER EXTREMITY Left 10/16/2020   Procedure: ABDOMINAL AORTOGRAM W/LOWER EXTREMITY;  Surgeon: Sheree Penne Bruckner, MD;  Location: [facility] INVASIVE CV LAB;  Service: Cardiovascular;  Laterality: Left;   ABDOMINAL AORTOGRAM W/LOWER EXTREMITY N/A 12/25/2020   Procedure: ABDOMINAL AORTOGRAM W/LOWER EXTREMITY;  Surgeon: Sheree Penne Bruckner, MD;  Location: [facility] INVASIVE CV LAB;  Service: Cardiovascular;  Laterality: N/A;   ABDOMINAL AORTOGRAM W/LOWER EXTREMITY N/A 10/21/2022   Procedure: ABDOMINAL AORTOGRAM W/LOWER EXTREMITY;  Surgeon: Sheree Penne Bruckner, MD;  Location: [facility] INVASIVE CV LAB;  Service: Cardiovascular;  Laterality: N/A;   AMPUTATION Right 01/12/2021   Procedure: 1ST AMPUTATION RAY;  Surgeon: Silva Juliene SAUNDERS, DPM;  Location: [facility] OR;  Service: Podiatry;  Laterality: Right;   AMPUTATION Right 03/13/2021   Procedure: RIGHT BELOW KNEE AMPUTATION;  Surgeon: Sheree Penne Bruckner, MD;  Location: [facility] OR;  Service: Vascular;  Laterality: Right;   AMPUTATION TOE Left 11/08/2022   Procedure: AMPUTATION TOE;  Surgeon: Silva Juliene SAUNDERS, DPM;  Location: [facility] ORS;  Service: Podiatry;  Laterality: Left;  DR MCDONALD WILL BLOCK, ON STRETCHER   AMPUTATION TOE Left 01/09/2023   Procedure: AMPUTATION OF REMAINING LEFT GREAT TOE AND BONE BEHIND TOE;  Surgeon: Silva Juliene SAUNDERS, DPM;  Location: [facility] OR;  Service: Podiatry;  Laterality: Left;   AV FISTULA PLACEMENT Right 09/14/2019   Procedure: Right Arm Basilic Vein transposition;  Surgeon: Eliza Bruckner RAMAN, MD;  Location: [facility] OR;  Service: Vascular;  Laterality: Right;   BONE BIOPSY Left 07/29/2020   Procedure: BONE  BIOPSY;  Surgeon: Silva Juliene SAUNDERS, DPM;  Location: [facility] OR;  Service: Podiatry;  Laterality: Left;  Need bone trephines and/or large bore Giamshidi   COLONOSCOPY     COLONOSCOPY WITH PROPOFOL  N/A 12/07/2021   Procedure: COLONOSCOPY WITH PROPOFOL ;  Surgeon: Rollin Dover, MD;  Location: [facility] ENDOSCOPY;  Service: Endoscopy;  Laterality: N/A;   PERIPHERAL VASCULAR ATHERECTOMY Left 06/19/2020   Procedure: PERIPHERAL VASCULAR ATHERECTOMY;  Surgeon: Sheree Penne Bruckner, MD;  Location: [facility] INVASIVE CV LAB;  Service: Cardiovascular;  Laterality: Left;  SFA   PERIPHERAL VASCULAR ATHERECTOMY  12/25/2020   Procedure: PERIPHERAL VASCULAR ATHERECTOMY;  Surgeon: Sheree Penne Bruckner, MD;  Location: [facility] INVASIVE CV LAB;  Service: Cardiovascular;;  Lt. PT - Laser Lt. SFA - Laser   PERIPHERAL VASCULAR BALLOON ANGIOPLASTY  12/25/2020   Procedure: PERIPHERAL VASCULAR BALLOON ANGIOPLASTY;  Surgeon: Sheree Penne Bruckner, MD;  Location: [facility] INVASIVE CV LAB;  Service: Cardiovascular;;  Lt.  SFA and PT   PERIPHERAL VASCULAR BALLOON ANGIOPLASTY Left 10/21/2022   Procedure: PERIPHERAL VASCULAR BALLOON ANGIOPLASTY;  Surgeon: Sheree Penne Bruckner, MD;  Location: [facility] INVASIVE CV LAB;  Service: Cardiovascular;  Laterality: Left;   PERIPHERAL VASCULAR BALLOON ANGIOPLASTY Right 12/08/2023   Procedure: PERIPHERAL VASCULAR BALLOON ANGIOPLASTY;  Surgeon: Melia Lynwood ORN, MD;  Location: [facility] INVASIVE CV LAB;  Service: Cardiovascular;  Laterality: Right;   PERIPHERAL VASCULAR INTERVENTION Left 10/21/2022   Procedure: PERIPHERAL VASCULAR INTERVENTION;  Surgeon: Sheree Penne Bruckner, MD;  Location: [facility] INVASIVE CV LAB;  Service: Cardiovascular;  Laterality: Left;   POLYPECTOMY  12/07/2021   Procedure: POLYPECTOMY;  Surgeon: Rollin Dover, MD;  Location: [facility] ENDOSCOPY;  Service: Endoscopy;;   UPPER GASTROINTESTINAL ENDOSCOPY  02/2019   Dr Rollin     WISDOM TOOTH EXTRACTION      reports that he has been smoking cigarettes. He  has never used smokeless tobacco. He reports that he does not drink alcohol  and does not use drugs. Allergies[1] Family History  Problem Relation Age of Onset   Cancer Father    Diabetes Mother    Prior to Admission medications  Medication Sig Start Date End Date Taking? Authorizing Provider  aspirin  EC 81 MG EC tablet Take 1 tablet (81 mg total) by mouth daily. Swallow whole. 06/21/20  Yes Odell Celinda Balo, MD  clopidogrel  (PLAVIX ) 75 MG tablet Take 1 tablet (75 mg total) by mouth daily with breakfast. 06/21/20  Yes Odell Celinda Balo, MD  gentamicin  ointment (GARAMYCIN ) 0.1 % Apply 1 Application topically daily. Apply to wound daily 11/09/24  Yes McDonald, Juliene SAUNDERS, DPM  Insulin  Disposable Pump (OMNIPOD 5 G7 PODS, GEN 5,) MISC 1 Device by Does not apply route every other day. Patient taking differently: 1 Device by Does not apply route continuous. 11/05/24  Yes Shamleffer, Ibtehal Jaralla, MD  insulin  lispro (HUMALOG ) 100 UNIT/ML injection FOR USE IN PUMP, TOTAL OF 50 UNITS DAILY 11/05/24  Yes Shamleffer, Ibtehal Jaralla, MD  Methoxy PEG-Epoetin  Beta (MIRCERA IJ) Inject into the skin. 07/28/23  Yes [provider]  pantoprazole  (PROTONIX ) 40 MG tablet Take 1 tablet (40 mg total) by mouth daily. 03/17/21  Yes Pokhrel, Laxman, MD  rosuvastatin  (CRESTOR ) 10 MG tablet Take 1 tablet (10 mg total) by mouth daily. 11/05/24  Yes Shamleffer, Donell Cardinal, MD  Continuous Glucose Sensor (DEXCOM G7 SENSOR) MISC 1 Device by Other route as directed. 1 sensor every 10 days 11/05/24   Shamleffer, Ibtehal Jaralla, MD  mupirocin  ointment (BACTROBAN ) 2 % Apply 1 Application topically daily. Recommend 30 g tube for 30 day supply of mupirocin  to apply to wound on outside of foot and on blister once daily. Cover with gauze. Patient not taking: Reported on 12/11/2024 09/22/24   Magdalen Prentice PARAS, DPM                                                                                 Vitals:   12/11/24 1700  12/11/24 1715 12/11/24 1800 12/11/24 1811  BP: (!) 141/74 137/75 139/79   Pulse: (!) 114 (!) 113 (!) 110   Resp: (!) 27 (!) 26 (!) 21   Temp:    99.5 F (37.5  C)  TempSrc:    Temporal  SpO2: 95% 95% 97%    Physical Exam Vitals reviewed.  Constitutional:      General: He is not in acute distress.    Appearance: He is not ill-appearing.  HENT:     Head: Normocephalic and atraumatic.  Eyes:     Extraocular Movements: Extraocular movements intact.  Cardiovascular:     Rate and Rhythm: Normal rate and regular rhythm.     Pulses:          Dorsalis pedis pulses are 0 on the left side.       Posterior tibial pulses are detected w/ Doppler on the left side.     Heart sounds: Murmur heard.     Systolic murmur is present with a grade of 4/6.     Gallop present. S3 sounds present.     Comments:  pt has known pad and femoral stent.  Pulmonary:     Breath sounds: Wheezing present.  Chest:     Chest wall: No deformity.  Abdominal:     General: There is no distension.     Palpations: Abdomen is soft.  Musculoskeletal:     Left lower leg: 1+ Edema present.  Feet:     Left foot:     Skin integrity: Ulcer and dry skin present.  Skin:    General: Skin is cool.     Coloration: Skin is ashen.  Neurological:     Mental Status: He is unresponsive.     GCS: GCS eye subscore is 2. GCS verbal subscore is 2. GCS motor subscore is 1.     Cranial Nerves: No facial asymmetry.     Labs on Admission: I have personally reviewed following labs and imaging studies CBC: Recent Labs  Lab 12/11/24 1452 12/11/24 1511  WBC 8.1  --   NEUTROABS 7.2  --   HGB 10.8* 11.6*  11.2*  HCT 32.4* 34.0*  33.0*  MCV 90.0  --   PLT 59*  --    Basic Metabolic Panel: Recent Labs  Lab 12/11/24 1452 12/11/24 1511  NA 138 136  134*  K 3.5 3.5  3.5  CL 95* 94*  CO2 29  --   GLUCOSE 151* 149*  BUN 23 28*  CREATININE 7.15* 7.30*  CALCIUM  8.9  --    GFR: CrCl cannot be calculated (Unknown ideal  weight.). Liver Function Tests: Recent Labs  Lab 12/11/24 1452  AST 23  ALT 9  ALKPHOS 54  BILITOT 1.1  PROT 6.0*  ALBUMIN  3.6   No results for input(s): LIPASE, AMYLASE in the last 168 hours. Recent Labs  Lab 12/11/24 1612  AMMONIA 23   Recent Labs    12/11/24 1452 12/11/24 1511  BUN 23 28*  CREATININE 7.15* 7.30*    Cardiac Enzymes: No results for input(s): CKTOTAL, CKMB, CKMBINDEX, TROPONINI in the last 168 hours. BNP (last 3 results) No results for input(s): PROBNP in the last 8760 hours. HbA1C: No results for input(s): HGBA1C in the last 72 hours. CBG: Recent Labs  Lab 12/11/24 1453  GLUCAP 139*   Lipid Profile: No results for input(s): CHOL, HDL, LDLCALC, TRIG, CHOLHDL, LDLDIRECT in the last 72 hours. Thyroid  Function Tests: No results for input(s): TSH, T4TOTAL, FREET4, T3FREE, THYROIDAB in the last 72 hours. Anemia Panel: No results for input(s): VITAMINB12, FOLATE, FERRITIN, TIBC, IRON , RETICCTPCT in the last 72 hours. Urine analysis:    Component Value Date/Time   COLORURINE AMBER (A) 07/27/2020 1050  APPEARANCEUR CLOUDY (A) 07/27/2020 1050   LABSPEC 1.020 07/27/2020 1050   PHURINE 5.0 07/27/2020 1050   GLUCOSEU 150 (A) 07/27/2020 1050   HGBUR LARGE (A) 07/27/2020 1050   BILIRUBINUR SMALL (A) 07/27/2020 1050   KETONESUR NEGATIVE 07/27/2020 1050   PROTEINUR >=300 (A) 07/27/2020 1050   NITRITE NEGATIVE 07/27/2020 1050   LEUKOCYTESUR NEGATIVE 07/27/2020 1050   Radiological Exams on Admission: DG Chest Port 1 View Result Date: 12/11/2024 CLINICAL DATA:  Questionable sepsis - evaluate for abnormality EXAM: PORTABLE CHEST 1 VIEW COMPARISON:  March 07, 2021 FINDINGS: The cardiomediastinal silhouette is unchanged in contour. Small LEFT pleural effusion. No pneumothorax. Diffuse interstitial markings with increased peribronchial cuffing. Atherosclerotic calcifications. IMPRESSION: Constellation of  findings are favored to reflect pulmonary edema with small LEFT pleural effusion. Differential considerations include atypical infection. Electronically Signed   By: Corean Salter M.D.   On: 12/11/2024 15:44   CT Head Wo Contrast Result Date: 12/11/2024 CLINICAL DATA:  Altered mental status. EXAM: CT HEAD WITHOUT CONTRAST TECHNIQUE: Contiguous axial images were obtained from the base of the skull through the vertex without intravenous contrast. RADIATION DOSE REDUCTION: This exam was performed according to the departmental dose-optimization program which includes automated exposure control, adjustment of the mA and/or kV according to patient size and/or use of iterative reconstruction technique. COMPARISON:  None Available. FINDINGS: Brain: There is generalized cerebral atrophy with widening of the extra-axial spaces and ventricular dilatation. There are areas of decreased attenuation within the white matter tracts of the supratentorial brain, consistent with microvascular disease changes. Small, chronic bilateral thalamic lacunar infarcts are noted. A chronic infarct involving the body of the right caudate is also seen. Vascular: There is moderate to marked severity bilateral cavernous carotid artery calcification. Skull: Normal. Negative for fracture or focal lesion. Sinuses/Orbits: No acute finding. Other: None. IMPRESSION: 1. Generalized cerebral atrophy with chronic white matter small vessel ischemic changes. 2. Chronic bilateral thalamic lacunar infarcts. 3. Chronic infarct involving the body of the right caudate. 4. No acute intracranial abnormality. Electronically Signed   By: Suzen Dials M.D.   On: 12/11/2024 15:24   Data Reviewed: Relevant notes from primary care and specialist visits, past discharge summaries as available in EHR, including Care Everywhere . Prior diagnostic testing as pertinent to current admission diagnoses, Updated medications and problem lists for reconciliation .ED  course, including vitals, labs, imaging, treatment and response to treatment,Triage notes, nursing and pharmacy notes and ED provider's notes.Notable results as noted in HPI.Discussed case with EDMD/ ED APP/ or Specialty MD on call and as needed.  Assessment & Plan   >>AMS/ Acute Encephalopathy: D/D include sepsis, metabolic, hepatic. D/D I suspect pt may have aspirated, as a result of some cardiac or neurologic event. We will investigate further.  NPO aspiration precautions/ fall/ seizure precautions. We will image with MRI brian and cta chest and ct abd pelvis to identify any infections. PCL and prolactin for possible seizures or hypoglycemia. Pt's prognosis is guarded and d/w wife about same. Code status is verified with wife.    >> Nausea vomiting and diarrhea: Patient's wife reports that over the past few weeks patient has been constantly having nausea and vomiting and diarrhea.     >> End-stage renal disease on hemodialysis: Patient is scheduled for hemodialysis on Tuesday Thursday and Saturday.  Have requested nephrology consult.  Electrolytes are within normal limits today.   >>DM II with complications of retinopathy, neuropathy, peripheral arterial disease: Glycemic protocol accu-checks q 2 hours. Stop insulin  pump.   >>  Diabetic foot wound: Patient is followed by podiatry for ulcer of the left second toe.  Differentials include osteomyelitis and or sepsis related to it.  >> Decreased pedal pulse/ PAD: Will obtain bedside Doppler for pulse.  Per chart review patient has lower extremity arterial Doppler followed by Dr. Sheree with vascular showing 50 to 74% stenosis of the common femoral artery and the superficial femoral artery, poorly visualized superficial femoral artery and popliteal stent and this testing was done in June.   >> Tobacco abuse: Nicotine  patch.    DVT prophylaxis:  Heparin .  Consults:  Nephrology.  Advance Care Planning:    Code Status: Limited: Do  not attempt resuscitation (DNR) -DNR-LIMITED -Do Not Intubate/DNI    Family Communication:  Wife Disposition Plan:  To be determined Severity of Illness: The appropriate patient status for this patient is INPATIENT. Inpatient status is judged to be reasonable and necessary in order to provide the required intensity of service to ensure the patient's safety. The patient's presenting symptoms, physical exam findings, and initial radiographic and laboratory data in the context of their chronic comorbidities is felt to place them at high risk for further clinical deterioration. Furthermore, it is not anticipated that the patient will be medically stable for discharge from the hospital within 2 midnights of admission.   * I certify that at the point of admission it is my clinical judgment that the patient will require inpatient hospital care spanning beyond 2 midnights from the point of admission due to high intensity of service, high risk for further deterioration and high frequency of surveillance required.*  Unresulted Labs (From admission, onward)     Start     Ordered   12/12/24 0500  CBC with Differential/Platelet  Tomorrow morning,   R        12/11/24 1853   12/12/24 0500  Comprehensive metabolic panel with GFR  Tomorrow morning,   R        12/11/24 1853   12/12/24 0500  Magnesium   Tomorrow morning,   R        12/11/24 1853   12/12/24 0500  Phosphorus  Tomorrow morning,   R        12/11/24 1853   12/11/24 1918  CK  Once,   AD        12/11/24 1918   12/11/24 1918  Procalcitonin  Once,   AD        12/11/24 1918   12/11/24 1859  HIV Antibody (routine testing w rflx)  (HIV Antibody (Routine testing w reflex) panel)  Once,   R        12/11/24 1901   12/11/24 1845  Hepatitis B surface antigen  (New Admission Hemo Labs (Hepatitis B))  Once,   URGENT        12/11/24 1848   12/11/24 1845  Hepatitis B surface antibody,quantitative  (New Admission Hemo Labs (Hepatitis B))  Once,   URGENT         12/11/24 1848   12/11/24 1752  Prolactin  Add-on,   AD        12/11/24 1752   12/11/24 1448  Blood Culture (routine x 2)  (Septic presentation on arrival (screening labs, nursing and treatment orders for obvious sepsis))  BLOOD CULTURE X 2,   STAT      12/11/24 1448   12/11/24 1448  Urinalysis, w/ Reflex to Culture (Infection Suspected) -Urine, Clean Catch  (Septic presentation on arrival (screening labs, nursing and treatment orders for obvious  sepsis))  ONCE - URGENT,   URGENT       Question Answer Comment  Specimen Source Urine, Clean Catch   Obtain urine by in and out catheter if not obtained within 30 minutes of placing in a treatment room? Yes      12/11/24 1448            Meds ordered this encounter  Medications   ceFEPIme  (MAXIPIME ) 2 g in sodium chloride  0.9 % 100 mL IVPB    Antibiotic Indication::   Other Indication (list below)    Other Indication::   Unknown Source.   metroNIDAZOLE  (FLAGYL ) IVPB 500 mg    Antibiotic Indication::   Other Indication (list below)    Other Indication::   Unknown Source.   DISCONTD: vancomycin  (VANCOCIN ) IVPB 1000 mg/200 mL premix    Indication::   Other Indication (list below)    Other Indication::   Unknown Source.   acetaminophen  (TYLENOL ) suppository 650 mg   vancomycin  (VANCOREADY) IVPB 2000 mg/400 mL    Indication::   Sepsis   ondansetron  (ZOFRAN ) injection 4 mg   pantoprazole  (PROTONIX ) injection 40 mg   insulin  aspart (novoLOG ) injection 0-6 Units    Correction coverage::   Very Sensitive (ESRD/Dialysis)    CBG < 70::   Implement Hypoglycemia Standing Orders and refer to Hypoglycemia Standing Orders sidebar report    CBG 70 - 120::   0 units    CBG 121 - 150::   0 units    CBG 151 - 200::   1 unit    CBG 201-250::   2 units    CBG 251-300::   3 units    CBG 301-350::   4 units    CBG 351-400::   5 units    CBG > 400:   Give 6 units and call MD   Chlorhexidine  Gluconate Cloth 2 % PADS 6 each   metroNIDAZOLE  (FLAGYL ) IVPB  500 mg    Antibiotic Indication::   Other Indication (list below)    Other Indication::   sepsis   0.9 %  sodium chloride  infusion   ceFEPIme  (MAXIPIME ) 1 g in sodium chloride  0.9 % 100 mL IVPB    Antibiotic Indication::   Sepsis   sodium chloride  flush (NS) 0.9 % injection 3 mL     Orders Placed This Encounter  Procedures   Critical Care   Resp panel by RT-PCR (RSV, Flu A&B, Covid) Anterior Nasal Swab   Blood Culture (routine x 2)   DG Chest Port 1 View   CT Head Wo Contrast   MR BRAIN WO CONTRAST   CT Angio Chest PE W and/or Wo Contrast   CT ABDOMEN PELVIS W CONTRAST   CT Foot Left Wo Contrast   Comprehensive metabolic panel   CBC with Differential   Protime-INR   Urinalysis, w/ Reflex to Culture (Infection Suspected) -Urine, Clean Catch   Ammonia   Prolactin   Hepatitis B surface antigen   Hepatitis B surface antibody,quantitative   CBC with Differential/Platelet   Comprehensive metabolic panel with GFR   Magnesium    Phosphorus   HIV Antibody (routine testing w rflx)   CK   Procalcitonin   Diet NPO time specified   Document height and weight   Assess and Document Glasgow Coma Scale   Document vital signs within 1-hour of fluid bolus completion. Notify provider of abnormal vital signs despite fluid resuscitation.   DO NOT delay antibiotics if unable to obtain blood culture.  Refer to Sidebar Report: Sepsis Sidebar ED/IP   Notify provider for difficulties obtaining IV access.   Insert peripheral IV x 2   Initiate Carrier Fluid Protocol   Apply Diabetes Mellitus Care Plan   STAT CBG when hypoglycemia is suspected. If treated, recheck every 15 minutes after each treatment until CBG >/= 70 mg/dl   Refer to Hypoglycemia Protocol Sidebar Report for treatment of CBG < 70 mg/dl   Informed Consent Details: Physician/Practitioner Attestation; Transcribe to consent form and obtain patient signature   Pre-Hemodialysis Protocol - Day of Dialysis   Post-Dialysis Protocol - Day  of Dialysis   Bladder scan   Strict intake and output   Maintain IV access   Notify physician (specify)   Mobility Protocol: No Restrictions   Initiate Adult Central Line Maintenance and Catheter Clearance Protocol for patients with central line (CVC, PICC, Port, Hemodialysis, Trialysis)   Daily weights   Initiate CHG Protocol for patients in ICU/SD or any patient with a central line or foley catheter   Initiate Oral Care Protocol   RN may order General Admission PRN Orders utilizing General Admission PRN medications (through manage orders) for the following patient needs: allergy symptoms (Claritin), cold sores (Carmex), cough (Robitussin DM), eye irritation (Liquifilm Tears), hemorrhoids (Tucks), indigestion (Maalox), minor skin irritation (Hydrocortisone Cream), muscle pain (Ben Gay), nose irritation (saline nasal spray) and sore throat (Chloraseptic spray).   SCDs   Cardiac Monitoring Continuous x 48 hours Indications for use: Other, Acute neurological event; Other indications for use: AMS   Do not attempt resuscitation (DNR)- Limited -Do Not Intubate (DNI)   Code Sepsis activation.  This occurs automatically when order is signed and prioritizes pharmacy, lab, and radiology services for STAT collections and interventions.  If [facility] downtime, call [facility] [phone]) to activate Code Sepsis.   Consult for [facility] Admission   Consult for [facility] Admission   vancomycin  per pharmacy consult   Oxygen therapy Mode or (Route): Nasal cannula; Liters Per Minute: 2; Keep O2 saturation between: greater than 92 %   Pulse oximetry check with vital signs   I-stat chem 8, ED   CBG monitoring, ED   I-Stat Lactic Acid, ED   I-Stat venous blood gas, ED   ED EKG   EKG 12-Lead   EKG 12-Lead   Type and screen   Hemodialysis inpatient   Admit to Inpatient (patient's expected length of stay will be greater than 2 midnights or inpatient only procedure)    Author: Mario LULLA Blanch,  MD 12 pm- 8 pm. [facility]. 12/11/2024 7:29 PM Please note for any communication after hours contact [facility] Assigned provider on call on Amion.        [1]  Allergies Allergen Reactions   Amoxicillin -Pot Clavulanate Diarrhea   Midodrine  Other (See Comments)    Other reaction(s): Urinary Sensation   Tape Other (See Comments)    Latex band aids cause blistering

## 2024-12-11 NOTE — Consult Note (Signed)
 Renal Service Consult Note [facility] Duane Hall JONETTA Fret, MD  Patient: Duane Hall Date: 12/11/2024 Requesting Physician: Dr. Dasie  Reason for Consult: ESRD pt w/ fevers, SOB HPI: The patient is a 68 y.o. year-old w/ PMH as below who presented to ED for being found poorly responsive at home. Has has n/v as well. In ED BS was 200s, had multiple episodes of emesis. Requiring 2L of O2 which is new. In ED Bp was 137/72, HR 115, RR 20-26, tempo 101.3, then 99 then 98.5.  94-100% on RA. Labs showed K+ 3.5, CO2 29, bun 28, creat 7.30, Ca 8.9, alb 3.6, wbc 8k, Hb 10.8, LA 1.2. Troponin 1,507. CT abd/pelv showed no acute findings. CTA chest showed no PE, LLL pna, diffuse GG changes c/w edema. CXR showed IS pulm edema. Pt was admitted. We were asked to see for dialysis.    Pt seen in ED. Pt's wife gives the hx. She notes 1-2 days of being very tired and lethargic, worst last night then worsening today again. At home is usually in the [facility], will walk sometimes using a prosthetic if going out. Lately the dialysis has been strictly pulling only 0.3L since he had been cramping and they were trying to avoid that. Recently also has poor appetite w/ vomiting and diarrhea. The wife does his home HD at 3h four times per week. Pt overall is failing to thrive, they have a MOST form and pt is DNR-limited.  The wife states that he would want us  to try antibiotics for the infection, but if things get worse they would want to switch to comfort care (the family has just discussed this).    ROS - denies CP, no joint pain, no HA, no blurry vision, no rash, no diarrhea, no nausea/ vomiting   Past Medical History  Past Medical History:  Diagnosis Date   Asthma    Cancer (HCC)    Cataract    CKD (chronic kidney disease), stage III (HCC)    Dialysis [facility] at [facility]   Diabetes mellitus    Glaucoma    Hepatitis    Hep C   Hypertension    no longer   Osteomyelitis of great toe of left foot  [facility])    Vascular insufficiency 05/2020   Past Surgical History  Past Surgical History:  Procedure Laterality Date   A/V FISTULAGRAM N/A 12/08/2023   Procedure: A/V Fistulagram;  Surgeon: Melia Lynwood ORN, MD;  Location: [facility] INVASIVE CV LAB;  Service: Cardiovascular;  Laterality: N/A;   ABDOMINAL AORTOGRAM W/LOWER EXTREMITY N/A 06/19/2020   Procedure: ABDOMINAL AORTOGRAM W/LOWER EXTREMITY;  Surgeon: Sheree Penne Bruckner, MD;  Location: [facility] INVASIVE CV LAB;  Service: Cardiovascular;  Laterality: N/A;   ABDOMINAL AORTOGRAM W/LOWER EXTREMITY Left 10/16/2020   Procedure: ABDOMINAL AORTOGRAM W/LOWER EXTREMITY;  Surgeon: Sheree Penne Bruckner, MD;  Location: [facility] INVASIVE CV LAB;  Service: Cardiovascular;  Laterality: Left;   ABDOMINAL AORTOGRAM W/LOWER EXTREMITY N/A 12/25/2020   Procedure: ABDOMINAL AORTOGRAM W/LOWER EXTREMITY;  Surgeon: Sheree Penne Bruckner, MD;  Location: [facility] INVASIVE CV LAB;  Service: Cardiovascular;  Laterality: N/A;   ABDOMINAL AORTOGRAM W/LOWER EXTREMITY N/A 10/21/2022   Procedure: ABDOMINAL AORTOGRAM W/LOWER EXTREMITY;  Surgeon: Sheree Penne Bruckner, MD;  Location: [facility] INVASIVE CV LAB;  Service: Cardiovascular;  Laterality: N/A;   AMPUTATION Right 01/12/2021   Procedure: 1ST AMPUTATION RAY;  Surgeon: Silva Juliene SAUNDERS, DPM;  Location: [facility] OR;  Service: Podiatry;  Laterality: Right;   AMPUTATION Right 03/13/2021   Procedure:  RIGHT BELOW KNEE AMPUTATION;  Surgeon: Sheree Penne Bruckner, MD;  Location: [facility] OR;  Service: Vascular;  Laterality: Right;   AMPUTATION TOE Left 11/08/2022   Procedure: AMPUTATION TOE;  Surgeon: Silva Juliene SAUNDERS, DPM;  Location: [facility] ORS;  Service: Podiatry;  Laterality: Left;  DR MCDONALD WILL BLOCK, ON STRETCHER   AMPUTATION TOE Left 01/09/2023   Procedure: AMPUTATION OF REMAINING LEFT GREAT TOE AND BONE BEHIND TOE;  Surgeon: Silva Juliene SAUNDERS, DPM;  Location: [facility] OR;  Service: Podiatry;  Laterality: Left;   AV FISTULA PLACEMENT Right 09/14/2019    Procedure: Right Arm Basilic Vein transposition;  Surgeon: Eliza Bruckner RAMAN, MD;  Location: [facility] OR;  Service: Vascular;  Laterality: Right;   BONE BIOPSY Left 07/29/2020   Procedure: BONE BIOPSY;  Surgeon: Silva Juliene SAUNDERS, DPM;  Location: [facility] OR;  Service: Podiatry;  Laterality: Left;  Need bone trephines and/or large bore Giamshidi   COLONOSCOPY     COLONOSCOPY WITH PROPOFOL  N/A 12/07/2021   Procedure: COLONOSCOPY WITH PROPOFOL ;  Surgeon: Rollin Dover, MD;  Location: [facility] ENDOSCOPY;  Service: Endoscopy;  Laterality: N/A;   PERIPHERAL VASCULAR ATHERECTOMY Left 06/19/2020   Procedure: PERIPHERAL VASCULAR ATHERECTOMY;  Surgeon: Sheree Penne Bruckner, MD;  Location: [facility] INVASIVE CV LAB;  Service: Cardiovascular;  Laterality: Left;  SFA   PERIPHERAL VASCULAR ATHERECTOMY  12/25/2020   Procedure: PERIPHERAL VASCULAR ATHERECTOMY;  Surgeon: Sheree Penne Bruckner, MD;  Location: [facility] INVASIVE CV LAB;  Service: Cardiovascular;;  Lt. PT - Laser Lt. SFA - Laser   PERIPHERAL VASCULAR BALLOON ANGIOPLASTY  12/25/2020   Procedure: PERIPHERAL VASCULAR BALLOON ANGIOPLASTY;  Surgeon: Sheree Penne Bruckner, MD;  Location: [facility] INVASIVE CV LAB;  Service: Cardiovascular;;  Lt. SFA and PT   PERIPHERAL VASCULAR BALLOON ANGIOPLASTY Left 10/21/2022   Procedure: PERIPHERAL VASCULAR BALLOON ANGIOPLASTY;  Surgeon: Sheree Penne Bruckner, MD;  Location: [facility] INVASIVE CV LAB;  Service: Cardiovascular;  Laterality: Left;   PERIPHERAL VASCULAR BALLOON ANGIOPLASTY Right 12/08/2023   Procedure: PERIPHERAL VASCULAR BALLOON ANGIOPLASTY;  Surgeon: Melia Lynwood ORN, MD;  Location: [facility] INVASIVE CV LAB;  Service: Cardiovascular;  Laterality: Right;   PERIPHERAL VASCULAR INTERVENTION Left 10/21/2022   Procedure: PERIPHERAL VASCULAR INTERVENTION;  Surgeon: Sheree Penne Bruckner, MD;  Location: [facility] INVASIVE CV LAB;  Service: Cardiovascular;  Laterality: Left;   POLYPECTOMY  12/07/2021   Procedure: POLYPECTOMY;  Surgeon: Rollin Dover, MD;  Location: [facility] ENDOSCOPY;  Service: Endoscopy;;   UPPER GASTROINTESTINAL ENDOSCOPY  02/2019   Dr Rollin     WISDOM TOOTH EXTRACTION     Family History  Family History  Problem Relation Age of Onset   Cancer Father    Diabetes Mother    Social History  reports that he has been smoking cigarettes. He has never used smokeless tobacco. He reports that he does not drink alcohol  and does not use drugs. Allergies Allergies[1] Home medications Prior to Admission medications  Medication Sig Start Date End Date Taking? Authorizing Provider  aspirin  EC 81 MG EC tablet Take 1 tablet (81 mg total) by mouth daily. Swallow whole. 06/21/20  Yes Odell Celinda Balo, MD  clopidogrel  (PLAVIX ) 75 MG tablet Take 1 tablet (75 mg total) by mouth daily with breakfast. 06/21/20  Yes Odell Celinda Balo, MD  gentamicin  ointment (GARAMYCIN ) 0.1 % Apply 1 Application topically daily. Apply to wound daily 11/09/24  Yes McDonald, Juliene SAUNDERS, DPM  Insulin  Disposable Pump (OMNIPOD 5 G7 PODS, GEN 5,) MISC 1 Device by Does not apply route every other day. Patient taking differently:  1 Device by Does not apply route continuous. 11/05/24  Yes Shamleffer, Ibtehal Jaralla, MD  insulin  lispro (HUMALOG ) 100 UNIT/ML injection FOR USE IN PUMP, TOTAL OF 50 UNITS DAILY 11/05/24  Yes Shamleffer, Ibtehal Jaralla, MD  Methoxy PEG-Epoetin  Beta (MIRCERA IJ) Inject into the skin. 07/28/23  Yes [provider]  pantoprazole  (PROTONIX ) 40 MG tablet Take 1 tablet (40 mg total) by mouth daily. 03/17/21  Yes Pokhrel, Laxman, MD  rosuvastatin  (CRESTOR ) 10 MG tablet Take 1 tablet (10 mg total) by mouth daily. 11/05/24  Yes Shamleffer, Donell Cardinal, MD  Continuous Glucose Sensor (DEXCOM G7 SENSOR) MISC 1 Device by Other route as directed. 1 sensor every 10 days 11/05/24   Shamleffer, Ibtehal Jaralla, MD  mupirocin  ointment (BACTROBAN ) 2 % Apply 1 Application topically daily. Recommend 30 g tube for 30 day supply of mupirocin  to apply  to wound on outside of foot and on blister once daily. Cover with gauze. Patient not taking: Reported on 12/11/2024 09/22/24   Magdalen Prentice PARAS, DPM     Vitals:   12/11/24 1635 12/11/24 1645 12/11/24 1700 12/11/24 1715  BP: (!) 141/77 137/77 (!) 141/74 137/75  Pulse: (!) 117 (!) 116 (!) 114 (!) 113  Resp: (!) 28 (!) 28 (!) 27 (!) 26  Temp:      TempSrc:      SpO2: 96% 94% 95% 95%   Exam Gen alert, no distress Sclera anicteric, throat clear  No jvd or bruits Chest clear bilat to bases RRR no MRG Abd soft ntnd no mass or ascites +bs Ext 1+ bilat pretib LE edema  Neuro is quite lethargic, sleeping, didn't try to wake him    AVF +bruit   Home bp meds: None    OP HD: [facility] home HD MTTF  3h  76.5kg  Heparin  4000  pulls only 0.3 L to cover rinseback   BP 137/73, HR 114, RR 22-27, temp 101.3   K+ 3.5, bun 23, creat 7.1, alb 3.6, LFT's okay, wbc 8K, Hb 10.8 Flu / covid / rsv neg by pcr  Given IV cefepime , flagyl  and vanc in ED   Assessment/ Plan: Fever/ lethargy/ fatigue: suspected LLL PNA as seen on CTA chest. Also there is pulm edema by CXR and CT chest, possibly due to lost body weight. Started on IV abx. Going up for HD this evening as long as he remains stable.  DNR-limited: per the wife, IV abx are okay, but if he gets worse they would want to transition to comfort care.  ^Troponins: follow, hemodynamically stable on HD this evening, tolerating okay.  ESRD: on HD MTTF. Plan HD tonight.  Hyperkalemia: K+ 5.2, low K+ bath x w/ HD BP: bp's wnl, no bp lowering meds at home, follow Volume: some LE edema and +pulm edema by imaging. With altered MS we are delivering a gentle dialysis tonight, and UF goal is only 1.75L. Not sure how much this HD session will help but we are trying to cautious given this is high risk dialysis given his underlying chronic debility, and also with tachycardia and ^^troponins.  Anemia of esrd: Hb 10-12 here, follow.         Myer Fret  MD  [facility] 12/11/2024, 6:09 PM  Recent Labs  Lab 12/11/24 1452 12/11/24 1511  HGB 10.8* 11.6*  11.2*  ALBUMIN  3.6  --   CALCIUM  8.9  --   CREATININE 7.15* 7.30*  K 3.5 3.5  3.5   Inpatient medications:  pantoprazole  (PROTONIX ) IV  40 mg Intravenous Q12H    vancomycin            [1]  Allergies Allergen Reactions   Amoxicillin -Pot Clavulanate Diarrhea   Midodrine  Other (See Comments)    Other reaction(s): Urinary Sensation   Tape Other (See Comments)    Latex band aids cause blistering

## 2024-12-11 NOTE — ED Triage Notes (Signed)
 Pt BIB [facility] from home d/t unresponsiveness. Pt went to the bathroom around 3 , wife found him around 345 sitting at the door vomiting. Pt has not been very responsive.

## 2024-12-11 NOTE — ED Notes (Signed)
 Pt transported to CT via stretcher.

## 2024-12-11 NOTE — ED Provider Notes (Signed)
 Patient signed out to me by Dr. Elige pending completion of workup.  Patient has pneumonia.  He has been started on antibiotics.  He will need to be admitted and will consult hospitalist   Dasie Faden, MD 12/11/24 1640

## 2024-12-11 NOTE — Hospital Course (Signed)
 SABRA

## 2024-12-12 ENCOUNTER — Inpatient Hospital Stay (HOSPITAL_COMMUNITY)

## 2024-12-12 DIAGNOSIS — I739 Peripheral vascular disease, unspecified: Secondary | ICD-10-CM

## 2024-12-12 DIAGNOSIS — I214 Non-ST elevation (NSTEMI) myocardial infarction: Secondary | ICD-10-CM | POA: Diagnosis not present

## 2024-12-12 DIAGNOSIS — D696 Thrombocytopenia, unspecified: Secondary | ICD-10-CM | POA: Diagnosis not present

## 2024-12-12 DIAGNOSIS — R4182 Altered mental status, unspecified: Secondary | ICD-10-CM | POA: Diagnosis not present

## 2024-12-12 LAB — COMPREHENSIVE METABOLIC PANEL WITH GFR
ALT: 9 U/L (ref 0–44)
AST: 23 U/L (ref 15–41)
Albumin: 3.1 g/dL — ABNORMAL LOW (ref 3.5–5.0)
Alkaline Phosphatase: 46 U/L (ref 38–126)
Anion gap: 14 (ref 5–15)
BUN: 17 mg/dL (ref 8–23)
CO2: 28 mmol/L (ref 22–32)
Calcium: 8.1 mg/dL — ABNORMAL LOW (ref 8.9–10.3)
Chloride: 92 mmol/L — ABNORMAL LOW (ref 98–111)
Creatinine, Ser: 4.63 mg/dL — ABNORMAL HIGH (ref 0.61–1.24)
GFR, Estimated: 13 mL/min — ABNORMAL LOW (ref >60)
Glucose, Bld: 128 mg/dL — ABNORMAL HIGH (ref 70–99)
Potassium: 3.1 mmol/L — ABNORMAL LOW (ref 3.5–5.1)
Sodium: 134 mmol/L — ABNORMAL LOW (ref 135–145)
Total Bilirubin: 1.2 mg/dL (ref 0.0–1.2)
Total Protein: 5.4 g/dL — ABNORMAL LOW (ref 6.5–8.1)

## 2024-12-12 LAB — CBC WITH DIFFERENTIAL/PLATELET
Abs Immature Granulocytes: 0.02 K/uL (ref 0.00–0.07)
Basophils Absolute: 0 K/uL (ref 0.0–0.1)
Basophils Relative: 0 %
Eosinophils Absolute: 0 K/uL (ref 0.0–0.5)
Eosinophils Relative: 0 %
HCT: 27.8 % — ABNORMAL LOW (ref 39.0–52.0)
Hemoglobin: 9.3 g/dL — ABNORMAL LOW (ref 13.0–17.0)
Immature Granulocytes: 0 %
Lymphocytes Relative: 15 %
Lymphs Abs: 0.7 K/uL (ref 0.7–4.0)
MCH: 30 pg (ref 26.0–34.0)
MCHC: 33.5 g/dL (ref 30.0–36.0)
MCV: 89.7 fL (ref 80.0–100.0)
Monocytes Absolute: 0.4 K/uL (ref 0.1–1.0)
Monocytes Relative: 8 %
Neutro Abs: 3.7 K/uL (ref 1.7–7.7)
Neutrophils Relative %: 77 %
Platelets: 44 K/uL — ABNORMAL LOW (ref 150–400)
RBC: 3.1 MIL/uL — ABNORMAL LOW (ref 4.22–5.81)
RDW: 13.3 % (ref 11.5–15.5)
WBC: 4.8 K/uL (ref 4.0–10.5)
nRBC: 0 % (ref 0.0–0.2)

## 2024-12-12 LAB — TROPONIN I (HIGH SENSITIVITY): Troponin I (High Sensitivity): 2137 ng/L (ref <18)

## 2024-12-12 LAB — GLUCOSE, CAPILLARY
Glucose-Capillary: 125 mg/dL — ABNORMAL HIGH (ref 70–99)
Glucose-Capillary: 125 mg/dL — ABNORMAL HIGH (ref 70–99)
Glucose-Capillary: 126 mg/dL — ABNORMAL HIGH (ref 70–99)
Glucose-Capillary: 134 mg/dL — ABNORMAL HIGH (ref 70–99)
Glucose-Capillary: 142 mg/dL — ABNORMAL HIGH (ref 70–99)
Glucose-Capillary: 158 mg/dL — ABNORMAL HIGH (ref 70–99)

## 2024-12-12 LAB — MRSA NEXT GEN BY PCR, NASAL: MRSA by PCR Next Gen: NOT DETECTED

## 2024-12-12 LAB — PHOSPHORUS: Phosphorus: 3.6 mg/dL (ref 2.5–4.6)

## 2024-12-12 LAB — MAGNESIUM: Magnesium: 1.5 mg/dL — ABNORMAL LOW (ref 1.7–2.4)

## 2024-12-12 LAB — PROCALCITONIN: Procalcitonin: 2.18 ng/mL

## 2024-12-12 LAB — HEPATITIS B SURFACE ANTIGEN: Hepatitis B Surface Ag: NONREACTIVE

## 2024-12-12 LAB — HIV ANTIBODY (ROUTINE TESTING W REFLEX): HIV Screen 4th Generation wRfx: NONREACTIVE

## 2024-12-12 MED ORDER — CHLORHEXIDINE GLUCONATE CLOTH 2 % EX PADS
6.0000 | MEDICATED_PAD | Freq: Every day | CUTANEOUS | Status: DC
  Administered 2024-12-13: 06:00:00 6 via TOPICAL

## 2024-12-12 MED ORDER — ORAL CARE MOUTH RINSE: 15.0000 mL | OROMUCOSAL | Status: DC | PRN

## 2024-12-12 MED ORDER — ORAL CARE MOUTH RINSE
15.0000 mL | OROMUCOSAL | Status: DC
  Administered 2024-12-12 – 2024-12-18 (×7): 15 mL via OROMUCOSAL

## 2024-12-12 MED ORDER — MAGNESIUM SULFATE IN D5W 1-5 GM/100ML-% IV SOLN
1.0000 g | Freq: Once | INTRAVENOUS | Status: AC
  Administered 2024-12-12: 1 g via INTRAVENOUS
  Filled 2024-12-12: qty 100

## 2024-12-12 MED ORDER — INSULIN ASPART 100 UNIT/ML IJ SOLN
0.0000 [IU] | INTRAMUSCULAR | Status: DC
  Filled 2024-12-12: qty 1

## 2024-12-12 MED ORDER — ROSUVASTATIN CALCIUM 5 MG PO TABS
10.0000 mg | ORAL_TABLET | Freq: Every day | ORAL | Status: DC
  Administered 2024-12-13: 12:00:00 10 mg via ORAL
  Filled 2024-12-12: qty 2

## 2024-12-12 MED ORDER — MAGNESIUM SULFATE 2 GM/50ML IV SOLN: 2.0000 g | Freq: Once | INTRAVENOUS | Status: DC

## 2024-12-12 MED ORDER — ASPIRIN 81 MG PO TBEC
81.0000 mg | DELAYED_RELEASE_TABLET | Freq: Every day | ORAL | Status: DC
  Administered 2024-12-13: 12:00:00 81 mg via ORAL
  Filled 2024-12-12 (×3): qty 1

## 2024-12-12 NOTE — Progress Notes (Signed)
 SLP Cancellation Note  Patient Details Name: Leib Elahi MRN: [id number] DOB: 08/26/1956   Cancelled treatment:        Attempted to see pt for swallowing evaluation.  Pt off floor at time of attempt for MRI.  SLP will return as schedule permits.    Anette FORBES Grippe, MA, CCC-SLP Acute Rehabilitation Services Office: [phone] 12/12/2024, 2:00 PM

## 2024-12-12 NOTE — Progress Notes (Signed)
 Pt arrived via stretcher from ED by way of Dialysis.  Pt is sleepy, but awakens easily.  Nods yes/no and indicated he was not having any pain.  No family present for admission.   12/12/24 0043  Vitals  Temp 98.3 F (36.8 C)  BP (!) 147/73  MAP (mmHg) 95  BP Location Left Arm  BP Method Automatic  Patient Position (if appropriate) Lying  Pulse Rate 93  Pulse Rate Source Monitor  ECG Heart Rate 93  Resp 19  Level of Consciousness  Level of Consciousness Alert  MEWS COLOR  MEWS Score Color Green  Oxygen Therapy  SpO2 100 %  O2 Device Nasal Cannula  O2 Flow Rate (L/min) 4 L/min  Pain Assessment  Pain Scale 0-10  Pain Score 0  POSS Scale (Pasero Opioid Sedation Scale)  POSS *See Group Information* S-Acceptable,Sleep, easy to arouse  Height and Weight  Weight 73.3 kg  Type of Scale Used Bed  Type of Weight Actual  MEWS Score  MEWS Temp 0  MEWS Systolic 0  MEWS Pulse 0  MEWS RR 0  MEWS LOC 0  MEWS Score 0   Glade Lee BSN RN [facility] 12/12/2024, 1:21 AM

## 2024-12-12 NOTE — Progress Notes (Signed)
°  Progress Note   Patient: Duane Hall FMW:[id number] DOB: 17-Jan-1956 DOA: 12/11/2024     1 DOS: the patient was seen and examined on 12/12/2024 at 8:52AM      Brief hospital course: 68 y.o. M with ESRD on HD TThS, HTN, DM, hx osteomyelitis of great toe, hep C and PVD s/p right BKA who presented with malaise, found to have pneumonia sepsis.     Assessment and Plan: Pneumonia sepsis, unknown organism MRSA nares negative, DC vancomycin  Vomiting while lying down prior to admission, strongly suspect aspiration component - Continue cefepime  and Flagyl   Type II NSTEMI Discussed with cardiology, suspect this is demand ischemia, not ACS. -Obtain echocardiogram - Appreciate cardiology expertise  ESRD -Consult nephrology for routine HD - Consult palliative care for goals of care  Diabetes Glucose controlled - Continue sliding scale corrections  Peripheral vascular disease Hypertension -Resume aspirin  - Hold Plavix  and monitor platelets - Resume Crestor   Anemia of chronic kidney disease No clinical bleeding observed or reported, hemoglobin relatively stable so far - Trend CBC  Thrombocytopenia Platelets down to 44, suspect from sepsis - Trend platelets  Failure to thrive -Consult palliative care  Hypokalemia Hypomagnesemia - Supplement magnesium  - HD      Subjective: Patient is tired and weak, fever to 103 overnight, now resolved.  No respiratory distress, no chest pain      Physical Exam: BP (!) 145/70 (BP Location: Left Arm)   Pulse 91   Temp 98.7 F (37.1 C) (Oral)   Resp 16   Wt 73.3 kg   SpO2 97%   BMI 23.86 kg/m   Elderly adult male, appears frail and weak Lying in bed, RRR, no murmurs, no peripheral edema Respiratory normal, lungs clear without rales or wheezes Abdomen soft, no tenderness palpation or guarding, no ascites or distention Attention diminished, affect blunted, judgment and insight appear impaired, face symmetric, severe  generalized weakness      Data Reviewed: Basic metabolic panel shows mild hypokalemia, hypomagnesemia, elevated creatinine to 2 ESRD CBC shows anemia, thrombocytopenia Discussed with palliative care and cardiology     Family Communication: Wife at the bedside    Disposition: Status is: Inpatient         Author: [name], MD 12/12/2024 12:47 PM  For on call review [email or web address].

## 2024-12-12 NOTE — Progress Notes (Signed)
 Overnight cross coverage  Received page from RN regarding critical troponin of 2137.    Per review of chart, troponin was 1507 on admission yesterday and has trended up to 2137 on morning labs today.  Informed by RN that patient is not complaining of chest pain and his vital signs remain stable.  Repeat EKG showing sinus rhythm, QTc 570, and new T wave inversions anterolaterally compared to previous EKG from yesterday.  CT abdomen pelvis done yesterday showing:  Indeterminate 14 mm hyperdense left upper renal lesion, likely reflecting a benign hemorrhagic cyst.  Patient does have worsening anemia and thrombocytopenia on labs.  Hemoglobin 9.3 and platelet count 44k today.  As such, anticoagulation would be contraindicated.  Per review of H&P, it seems patient told admitting physician that he has had poor quality of life for the past few months and had considered giving up on his hemodialysis treatments.  Admitting physician had spoken to the patient and his wife, he his DNR/DNI.  Discussed the case with on-call cardiologist Dr. Debarah who agrees that the patient is not a candidate anticoagulation or invasive cardiac interventions.  Cardiology team will consult in the morning.  I have placed palliative care consult for goals of care discussion.  Magnesium  low on labs and will be replaced.  Avoid QT prolonging drugs.

## 2024-12-12 NOTE — Progress Notes (Signed)
 [facility] Progress Note  Subjective:  Seen in room More alert today, eyes are open, he verbalizes minimally   Presentation summary: 68 y.o. year-old w/ PMH as below who presented to ED for being found poorly responsive at home. Has has n/v as well. In ED BS was 200s, had multiple episodes of emesis. Requiring 2L of O2 which is new. In ED Bp was 137/72, HR 115, RR 20-26, tempo 101.3, then 99 then 98.5.  94-100% on RA. Labs showed K+ 3.5, CO2 29, bun 28, creat 7.30, Ca 8.9, alb 3.6, wbc 8k, Hb 10.8, LA 1.2. Troponin 1,507. CT abd/pelv showed no acute findings. CTA chest showed no PE, LLL pna, diffuse GG changes c/w edema. CXR showed IS pulm edema. Pt was admitted. We were asked to see for dialysis. Pt seen in ED. Pt's wife gives the hx. She notes 1-2 days of being very tired and lethargic, worst last night then worsening today again. At home is usually in the [facility], will walk sometimes using a prosthetic if going out. Lately the dialysis has been strictly pulling only 0.3L since he had been cramping and they were trying to avoid that. Recently also has poor appetite w/ vomiting and diarrhea. The wife does his home HD at 3hrs, four times per week. Pt overall is failing to thrive, they have a MOST form and pt is DNR-limited.  The wife states that he would want us  to try antibiotics for the infection, but if things get worse they would want to switch to comfort care (the family has just discussed this amongst themselves).   Vitals:   12/11/24 2341 12/12/24 0043 12/12/24 0414 12/12/24 0820  BP: 116/63 (!) 147/73 133/64 (!) 145/70  Pulse: 84 93 75 91  Resp: 12 19 16    Temp: 98.8 F (37.1 C) 98.3 F (36.8 C) 98.3 F (36.8 C) 98.7 F (37.1 C)  TempSrc:    Oral  SpO2: 96% 100% 99% 97%  Weight:  73.3 kg      Exam: Gen lethargic Sclera anicteric, throat clear  No jvd or bruits Chest clear bilat to bases RRR no MRG Abd soft ntnd no mass or ascites +bs Ext 1+ bilat pretib LE edema   Neuro lethargic    AVF +bruit    Home bp meds: None     OP HD: [facility] home HD MTTF  3h  76.5kg  Heparin  4000  pulls only 0.3 L to cover rinseback    BP 137/73, HR 114, RR 22-27, temp 101.3   K+ 3.5, bun 23, creat 7.1, alb 3.6, LFT's okay, wbc 8K, Hb 10.8 Flu / covid / rsv neg by pcr  Given IV cefepime , flagyl  and vanc in ED    Assessment/ Plan: Fever/ lethargy/ fatigue: suspected LLL PNA as seen on CTA chest. Also there is pulm edema by CXR and CT chest, possibly due to lost body weight. Started on IV abx.  DNR-limited: per the wife, per their MOST form ^Troponins: per pmd ESRD: on HD MTTF. Had HD last night here. Next HD tomorrow.  BP: bp's stable 120- 145/ 65-75.  Volume: some LE edema and +pulm edema by imaging. Got 1.75 L off w/ HD Sat evening. Will try for 2 L UF goal w/ 3h HD tomorrow.  Anemia of esrd: Hb 10-12 here, follow.   GOC: pt is DNR, the wife is in charge of accepting or declining our services and has made it clear to me that she is trying to respect the  details of the MOST form (which her husband worked on for a long time to fill out). If there is something not covered in the MOST form (like dialysis), then SHE will make a decision about each HD procedure yes or no. For now she is accepting of the HD plan for Monday. If she continues to see improvement in his mental status and energy level I believe she will cont to accept life- saving procedures, but if he goes the other way she has said in no uncertain terms that they will let him go (I.e., comfort care/ hospice care).     Myer Fret MD  [facility] 12/12/2024, 10:11 AM  Recent Labs  Lab 12/11/24 1452 12/11/24 1511 12/12/24 0301  HGB 10.8* 11.6*  11.2* 9.3*  ALBUMIN  3.6  --  3.1*  CALCIUM  8.9  --  8.1*  PHOS  --   --  3.6  CREATININE 7.15* 7.30* 4.63*  K 3.5 3.5  3.5 3.1*   No results for input(s): IRON , TIBC, FERRITIN in the last 168 hours. Inpatient medications:  aspirin  EC  81 mg Oral Daily    Chlorhexidine  Gluconate Cloth  6 each Topical Q0600   insulin  aspart  0-6 Units Subcutaneous Q4H   mouth rinse  15 mL Mouth Rinse 4 times per day   pantoprazole  (PROTONIX ) IV  40 mg Intravenous Q12H   sodium chloride  flush  3 mL Intravenous Q12H    sodium chloride  50 mL/hr at 12/12/24 0500   ceFEPime  (MAXIPIME ) IV     magnesium  sulfate bolus IVPB     metronidazole  500 mg (12/12/24 0535)   [START ON 12/14/2024] vancomycin      mouth rinse

## 2024-12-12 NOTE — Evaluation (Addendum)
 Clinical/Bedside Swallow Evaluation Patient Details  Name: Duane Hall MRN: [id number] Date of Birth: [birth year]  Today's Date: 12/12/2024 Time: SLP Start Time (ACUTE ONLY): 1409 SLP Stop Time (ACUTE ONLY): 1423 SLP Time Calculation (min) (ACUTE ONLY): 14 min  Past Medical History:  Past Medical History:  Diagnosis Date   Asthma    Cancer (HCC)    Cataract    CKD (chronic kidney disease), stage III (HCC)    Dialysis [facility] at [facility]   Diabetes mellitus    Glaucoma    Hepatitis    Hep C   Hypertension    no longer   Osteomyelitis of great toe of left foot [facility])    Vascular insufficiency 05/2020   Past Surgical History:  Past Surgical History:  Procedure Laterality Date   A/V FISTULAGRAM N/A 12/08/2023   Procedure: A/V Fistulagram;  Surgeon: Melia Lynwood ORN, MD;  Location: [facility] INVASIVE CV LAB;  Service: Cardiovascular;  Laterality: N/A;   ABDOMINAL AORTOGRAM W/LOWER EXTREMITY N/A 06/19/2020   Procedure: ABDOMINAL AORTOGRAM W/LOWER EXTREMITY;  Surgeon: Sheree Penne Bruckner, MD;  Location: [facility] INVASIVE CV LAB;  Service: Cardiovascular;  Laterality: N/A;   ABDOMINAL AORTOGRAM W/LOWER EXTREMITY Left 10/16/2020   Procedure: ABDOMINAL AORTOGRAM W/LOWER EXTREMITY;  Surgeon: Sheree Penne Bruckner, MD;  Location: [facility] INVASIVE CV LAB;  Service: Cardiovascular;  Laterality: Left;   ABDOMINAL AORTOGRAM W/LOWER EXTREMITY N/A 12/25/2020   Procedure: ABDOMINAL AORTOGRAM W/LOWER EXTREMITY;  Surgeon: Sheree Penne Bruckner, MD;  Location: [facility] INVASIVE CV LAB;  Service: Cardiovascular;  Laterality: N/A;   ABDOMINAL AORTOGRAM W/LOWER EXTREMITY N/A 10/21/2022   Procedure: ABDOMINAL AORTOGRAM W/LOWER EXTREMITY;  Surgeon: Sheree Penne Bruckner, MD;  Location: [facility] INVASIVE CV LAB;  Service: Cardiovascular;  Laterality: N/A;   AMPUTATION Right 01/12/2021   Procedure: 1ST AMPUTATION RAY;  Surgeon: Silva Juliene SAUNDERS, DPM;  Location: [facility] OR;  Service: Podiatry;  Laterality: Right;    AMPUTATION Right 03/13/2021   Procedure: RIGHT BELOW KNEE AMPUTATION;  Surgeon: Sheree Penne Bruckner, MD;  Location: [facility] OR;  Service: Vascular;  Laterality: Right;   AMPUTATION TOE Left 11/08/2022   Procedure: AMPUTATION TOE;  Surgeon: Silva Juliene SAUNDERS, DPM;  Location: [facility] ORS;  Service: Podiatry;  Laterality: Left;  DR MCDONALD WILL BLOCK, ON STRETCHER   AMPUTATION TOE Left 01/09/2023   Procedure: AMPUTATION OF REMAINING LEFT GREAT TOE AND BONE BEHIND TOE;  Surgeon: Silva Juliene SAUNDERS, DPM;  Location: [facility] OR;  Service: Podiatry;  Laterality: Left;   AV FISTULA PLACEMENT Right 09/14/2019   Procedure: Right Arm Basilic Vein transposition;  Surgeon: Eliza Bruckner RAMAN, MD;  Location: [facility] OR;  Service: Vascular;  Laterality: Right;   BONE BIOPSY Left 07/29/2020   Procedure: BONE BIOPSY;  Surgeon: Silva Juliene SAUNDERS, DPM;  Location: [facility] OR;  Service: Podiatry;  Laterality: Left;  Need bone trephines and/or large bore Giamshidi   COLONOSCOPY     COLONOSCOPY WITH PROPOFOL  N/A 12/07/2021   Procedure: COLONOSCOPY WITH PROPOFOL ;  Surgeon: Rollin Dover, MD;  Location: [facility] ENDOSCOPY;  Service: Endoscopy;  Laterality: N/A;   PERIPHERAL VASCULAR ATHERECTOMY Left 06/19/2020   Procedure: PERIPHERAL VASCULAR ATHERECTOMY;  Surgeon: Sheree Penne Bruckner, MD;  Location: [facility] INVASIVE CV LAB;  Service: Cardiovascular;  Laterality: Left;  SFA   PERIPHERAL VASCULAR ATHERECTOMY  12/25/2020   Procedure: PERIPHERAL VASCULAR ATHERECTOMY;  Surgeon: Sheree Penne Bruckner, MD;  Location: [facility] INVASIVE CV LAB;  Service: Cardiovascular;;  Lt. PT - Laser Lt. SFA - Laser   PERIPHERAL VASCULAR BALLOON ANGIOPLASTY  12/25/2020  Procedure: PERIPHERAL VASCULAR BALLOON ANGIOPLASTY;  Surgeon: Sheree Penne Bruckner, MD;  Location: [facility] INVASIVE CV LAB;  Service: Cardiovascular;;  Lt. SFA and PT   PERIPHERAL VASCULAR BALLOON ANGIOPLASTY Left 10/21/2022   Procedure: PERIPHERAL VASCULAR BALLOON ANGIOPLASTY;  Surgeon: Sheree Penne Bruckner, MD;  Location: [facility] INVASIVE CV LAB;  Service: Cardiovascular;  Laterality: Left;   PERIPHERAL VASCULAR BALLOON ANGIOPLASTY Right 12/08/2023   Procedure: PERIPHERAL VASCULAR BALLOON ANGIOPLASTY;  Surgeon: Melia Lynwood ORN, MD;  Location: [facility] INVASIVE CV LAB;  Service: Cardiovascular;  Laterality: Right;   PERIPHERAL VASCULAR INTERVENTION Left 10/21/2022   Procedure: PERIPHERAL VASCULAR INTERVENTION;  Surgeon: Sheree Penne Bruckner, MD;  Location: [facility] INVASIVE CV LAB;  Service: Cardiovascular;  Laterality: Left;   POLYPECTOMY  12/07/2021   Procedure: POLYPECTOMY;  Surgeon: Rollin Dover, MD;  Location: [facility] ENDOSCOPY;  Service: Endoscopy;;   UPPER GASTROINTESTINAL ENDOSCOPY  02/2019   Dr Rollin     WISDOM TOOTH EXTRACTION     HPI:  Braedon Sjogren is a 68 y.o. M who presented with malaise, found to have pneumonia sepsis. Pt with n/v and frequent difficulty keeping food down per wife.  Pt lying horizontal with vomiting PTA.  Chest CT 12/13: Interlobular septal thickening with scattered ground-glass opacity, suggesting mild interstitial edema. Peribronchovascular nodularity, most prominent in the left lower lobe, suggesting superimposed multifocal pneumonia. Mild centrilobular emphysematous changes. Small bilateral pleural effusions with associated compressive atelectasis in the bilateral lower lobes. No pneumothorax.  Pt with hx ESRD on HD TThS, HTN, DM, hx osteomyelitis of great toe, hep C and PVD s/p right BKA.    Assessment / Plan / Recommendation  Clinical Impression  Pt presents with functional swallowing as assessed clinically.  Pt tolerated all consistencies trialed, including serial straw sips of thin liquid, with no clincal s/s of aspiration.  He exhibited excellent oral clearance of regular texture solid with slightly prolonged oral phase.  MRI completed and results pending. If imaging reveals concern for silent aspiraiton, consider instrumental testing; otherwise pt has no further ST  needs.  Addendum:  MRI without acute infarct, SLP will sign off.  Recommend continuing regular texture diet with thin liquid.   SLP Visit Diagnosis: Dysphagia, unspecified (R13.10)    Aspiration Risk  No limitations    Diet Recommendation Regular;Thin liquid    Liquid Administration via: Cup;Straw Medication Administration: Whole meds with liquid (as tolerated) Supervision: Staff to assist with self feeding Compensations: Slow rate;Small sips/bites Postural Changes: Seated upright at 90 degrees    Other Recommendations Oral Care Recommendations: Oral care BID     Swallow Evaluation Recommendations  See above   Assistance Recommended at Discharge  N/A  Functional Status Assessment Patient has not had a recent decline in their functional status  Frequency and Duration  (N/A)          Prognosis Prognosis for improved oropharyngeal function:  (N/A)      Swallow Study   General Date of Onset: 12/11/24 HPI: Percival Glasheen is a 68 y.o. M who presented with malaise, found to have pneumonia sepsis. Pt with n/v and frequent difficulty keeping food down per wife.  Pt lying horizontal with vomiting PTA.  Chest CT 12/13: Interlobular septal thickening with scattered ground-glass opacity, suggesting mild interstitial edema. Peribronchovascular nodularity, most prominent in the left lower lobe, suggesting superimposed multifocal pneumonia. Mild centrilobular emphysematous changes. Small bilateral pleural effusions with associated compressive atelectasis in the bilateral lower lobes. No pneumothorax.  Pt with hx ESRD on HD TThS, HTN, DM, hx osteomyelitis  of great toe, hep C and PVD s/p right BKA. Type of Study: Bedside Swallow Evaluation Previous Swallow Assessment: None Diet Prior to this Study: Regular;Thin liquids (Level 0) Temperature Spikes Noted: No Respiratory Status: Nasal cannula History of Recent Intubation: No Behavior/Cognition: Cooperative;Pleasant mood;Alert Oral Cavity  Assessment: Within Functional Limits Oral Care Completed by SLP: No Oral Cavity - Dentition: Adequate natural dentition Patient Positioning: Upright in bed Baseline Vocal Quality: Normal Volitional Cough:  (Fair) Volitional Swallow: Able to elicit    Oral/Motor/Sensory Function Overall Oral Motor/Sensory Function: Mild impairment Facial ROM: Within Functional Limits (could not assess retraction) Facial Symmetry: Within Functional Limits Lingual ROM: Within Functional Limits Lingual Symmetry:  (could not test) Lingual Strength:  (Could not test) Velum: Within Functional Limits Mandible: Within Functional Limits   Ice Chips Ice chips: Not tested   Thin Liquid Thin Liquid: Within functional limits Presentation: Straw    Nectar Thick Nectar Thick Liquid: Not tested   Honey Thick Honey Thick Liquid: Not tested   Puree Puree: Within functional limits Presentation: Spoon   Solid     Solid: Within functional limits Presentation:  (SLP fed)      Anette FORBES Grippe, MA, CCC-SLP Acute Rehabilitation Services Office: [phone] 12/12/2024,2:37 PM

## 2024-12-12 NOTE — Consult Note (Signed)
 Cardiology Consultation   Patient ID: Duane Hall MRN: [id number]; DOB: 12-18-1956  Admit date: 12/11/2024 Date of Consult: 12/12/2024  PCP:  Lazoff, Shawn P, DO   [facility] Providers Cardiologist:  New    Patient Profile: Duane Hall is a 68 y.o. male with a history of PAD s/p atherectomy and angioplasty of right SFA and posterior tibial artery in 11/2020 with subsequent  below knee amputation in 02/2021 and then amputation of left great toe in 10/2022 in setting of osteomyelitis, hypertension, type 2 diabetes mellitus, ESRD on hemodialysis  on T/Th/Sat who is being seen 12/12/2024 for the evaluation of  NSTEMI at the request of Dr. Alfornia.  History of Present Illness: Duane Hall is a 68 year old male with the above history. He has a history of PAD as detailed above but no known cardiac disease.   He presented to the ED on 12/11/2024 for altered mental status. Wife reported he had a fall and she found him in his wheelchair unresponsive with vomit on him. Per wife, patient has had a very poor quality of life over the past few month. He has had a very poor appetite and can barely keep anything down without vomiting. Patient has been contemplating stopping dialysis. Wife requested that he be DNR/ DNI on admission per his wishes.   Upon arrival to the ED, he was febrile with temp of 101.3, tachycardic, and tachypneic. EKG showed sinus tachycardia, rate 115 bpm, with LVH, ST depression in inferior leads and leads V5-V6, and prolonged QTc of 501 ms. High-sensitivity troponin 1,507 >> 2,137. Chest x-ray showed diffuse interstitial markings and a small left pleural effusion. Chest CTA was negative for PE but showed multifocal pneumonia and mild interstitial edema with small bilateral pleural effusions. Head CT showed chronic bilateral thalamic lacunar infarcts and chronic infarct involving the body of the right caudate but no acute findings. Abdominal/ pelvic CT showed no acute  findings to explain symptoms but did show a likely pancreatitic and renal cysts. WBC 8.1, Hgb 10.8, Plts 59. Na 138, K 3.5, Glucose 151, BUN 23, Cr 7.15. LFTs normal. Lactic acid 2.0. Ammonia normal. Blood cultures pending.   Repeat EKG this morning showed normal sinus rhythm, rate 91 bpm, with new T wave inversions in precordial leads and prolonged QTc of 570s ms in setting of ischemic T waves. He was not started on IV Heparin  given severe thrombocytopenia. Cardiology was consulted for further evaluation.      Past Medical History:  Diagnosis Date   Asthma    Cancer (HCC)    Cataract    CKD (chronic kidney disease), stage III (HCC)    Dialysis S-M-W-Th at [facility]   Diabetes mellitus    Glaucoma    Hepatitis    Hep C   Hypertension    no longer   Osteomyelitis of great toe of left foot [facility])    Vascular insufficiency 05/2020    Past Surgical History:  Procedure Laterality Date   A/V FISTULAGRAM N/A 12/08/2023   Procedure: A/V Fistulagram;  Surgeon: Melia Lynwood ORN, MD;  Location: [facility] INVASIVE CV LAB;  Service: Cardiovascular;  Laterality: N/A;   ABDOMINAL AORTOGRAM W/LOWER EXTREMITY N/A 06/19/2020   Procedure: ABDOMINAL AORTOGRAM W/LOWER EXTREMITY;  Surgeon: Sheree Penne Bruckner, MD;  Location: [facility] INVASIVE CV LAB;  Service: Cardiovascular;  Laterality: N/A;   ABDOMINAL AORTOGRAM W/LOWER EXTREMITY Left 10/16/2020   Procedure: ABDOMINAL AORTOGRAM W/LOWER EXTREMITY;  Surgeon: Sheree Penne Bruckner, MD;  Location: [facility] INVASIVE CV LAB;  Service: Cardiovascular;  Laterality: Left;   ABDOMINAL AORTOGRAM W/LOWER EXTREMITY N/A 12/25/2020   Procedure: ABDOMINAL AORTOGRAM W/LOWER EXTREMITY;  Surgeon: Sheree Penne Bruckner, MD;  Location: [facility] INVASIVE CV LAB;  Service: Cardiovascular;  Laterality: N/A;   ABDOMINAL AORTOGRAM W/LOWER EXTREMITY N/A 10/21/2022   Procedure: ABDOMINAL AORTOGRAM W/LOWER EXTREMITY;  Surgeon: Sheree Penne Bruckner, MD;  Location: [facility] INVASIVE CV LAB;   Service: Cardiovascular;  Laterality: N/A;   AMPUTATION Right 01/12/2021   Procedure: 1ST AMPUTATION RAY;  Surgeon: Silva Juliene SAUNDERS, DPM;  Location: [facility] OR;  Service: Podiatry;  Laterality: Right;   AMPUTATION Right 03/13/2021   Procedure: RIGHT BELOW KNEE AMPUTATION;  Surgeon: Sheree Penne Bruckner, MD;  Location: [facility] OR;  Service: Vascular;  Laterality: Right;   AMPUTATION TOE Left 11/08/2022   Procedure: AMPUTATION TOE;  Surgeon: Silva Juliene SAUNDERS, DPM;  Location: [facility] ORS;  Service: Podiatry;  Laterality: Left;  DR MCDONALD WILL BLOCK, ON STRETCHER   AMPUTATION TOE Left 01/09/2023   Procedure: AMPUTATION OF REMAINING LEFT GREAT TOE AND BONE BEHIND TOE;  Surgeon: Silva Juliene SAUNDERS, DPM;  Location: [facility] OR;  Service: Podiatry;  Laterality: Left;   AV FISTULA PLACEMENT Right 09/14/2019   Procedure: Right Arm Basilic Vein transposition;  Surgeon: Eliza Bruckner RAMAN, MD;  Location: [facility] OR;  Service: Vascular;  Laterality: Right;   BONE BIOPSY Left 07/29/2020   Procedure: BONE BIOPSY;  Surgeon: Silva Juliene SAUNDERS, DPM;  Location: [facility] OR;  Service: Podiatry;  Laterality: Left;  Need bone trephines and/or large bore Giamshidi   COLONOSCOPY     COLONOSCOPY WITH PROPOFOL  N/A 12/07/2021   Procedure: COLONOSCOPY WITH PROPOFOL ;  Surgeon: Rollin Dover, MD;  Location: [facility] ENDOSCOPY;  Service: Endoscopy;  Laterality: N/A;   PERIPHERAL VASCULAR ATHERECTOMY Left 06/19/2020   Procedure: PERIPHERAL VASCULAR ATHERECTOMY;  Surgeon: Sheree Penne Bruckner, MD;  Location: [facility] INVASIVE CV LAB;  Service: Cardiovascular;  Laterality: Left;  SFA   PERIPHERAL VASCULAR ATHERECTOMY  12/25/2020   Procedure: PERIPHERAL VASCULAR ATHERECTOMY;  Surgeon: Sheree Penne Bruckner, MD;  Location: [facility] INVASIVE CV LAB;  Service: Cardiovascular;;  Lt. PT - Laser Lt. SFA - Laser   PERIPHERAL VASCULAR BALLOON ANGIOPLASTY  12/25/2020   Procedure: PERIPHERAL VASCULAR BALLOON ANGIOPLASTY;  Surgeon: Sheree Penne Bruckner, MD;   Location: [facility] INVASIVE CV LAB;  Service: Cardiovascular;;  Lt. SFA and PT   PERIPHERAL VASCULAR BALLOON ANGIOPLASTY Left 10/21/2022   Procedure: PERIPHERAL VASCULAR BALLOON ANGIOPLASTY;  Surgeon: Sheree Penne Bruckner, MD;  Location: [facility] INVASIVE CV LAB;  Service: Cardiovascular;  Laterality: Left;   PERIPHERAL VASCULAR BALLOON ANGIOPLASTY Right 12/08/2023   Procedure: PERIPHERAL VASCULAR BALLOON ANGIOPLASTY;  Surgeon: Melia Lynwood ORN, MD;  Location: [facility] INVASIVE CV LAB;  Service: Cardiovascular;  Laterality: Right;   PERIPHERAL VASCULAR INTERVENTION Left 10/21/2022   Procedure: PERIPHERAL VASCULAR INTERVENTION;  Surgeon: Sheree Penne Bruckner, MD;  Location: [facility] INVASIVE CV LAB;  Service: Cardiovascular;  Laterality: Left;   POLYPECTOMY  12/07/2021   Procedure: POLYPECTOMY;  Surgeon: Rollin Dover, MD;  Location: [facility] ENDOSCOPY;  Service: Endoscopy;;   UPPER GASTROINTESTINAL ENDOSCOPY  02/2019   Dr Rollin     WISDOM TOOTH EXTRACTION         Scheduled Meds:  Chlorhexidine  Gluconate Cloth  6 each Topical Q0600   insulin  aspart  0-6 Units Subcutaneous Q4H   mouth rinse  15 mL Mouth Rinse 4 times per day   pantoprazole  (PROTONIX ) IV  40 mg Intravenous Q12H   sodium chloride  flush  3 mL Intravenous Q12H  Continuous Infusions:  sodium chloride  50 mL/hr at 12/12/24 0500   ceFEPime  (MAXIPIME ) IV     magnesium  sulfate bolus IVPB     metronidazole  500 mg (12/12/24 0535)   [START ON 12/14/2024] vancomycin      PRN Meds: mouth rinse  Allergies:   Allergies[1]  Social History:   Social History   Socioeconomic History   Marital status: Married    Spouse name: Not on file   Number of children: Not on file   Years of education: Not on file   Highest education level: Not on file  Occupational History   Occupation: Art Gallery Manager  Tobacco Use   Smoking status: Every Day    Current packs/day: 0.30    Types: Cigarettes   Smokeless tobacco: Never   Tobacco comments:    Used to [employer]  Use   Vaping status: Some Days   Substances: Nicotine   Substance and Sexual Activity   Alcohol  use: No    Alcohol /week: 0.0 standard drinks of alcohol    Drug use: No   Sexual activity: Not Currently  Other Topics Concern   Not on file  Social History Narrative   Married.    Social Drivers of Health   Tobacco Use: High Risk (11/05/2024)   Patient History    Smoking Tobacco Use: Every Day    Smokeless Tobacco Use: Never    Passive Exposure: Not on file  Financial Resource Strain: Unknown (02/20/2022)   Received from [facility]   Overall Financial Resource Strain (CARDIA)  Food Insecurity: Low Risk (08/10/2024)   Received from [facility]   Epic    Within the past 12 months, the food you bought just didn't last and you didn't have money to get more. : Never true    Within the past 12 months, you worried that your food would run out before you got money to buy more: Never true  Transportation Needs: No Transportation Needs (08/10/2024)   Received from [facility]    In the past 12 months, has lack of reliable transportation kept you from medical appointments, meetings, work or from getting things needed for daily living? : No  Physical Activity: Not on file  Stress: Not on file  Social Connections: Not on file  Intimate Partner Violence: Unknown (02/20/2022)   Received from [facility] visits prior to 03/01/2023.   Epic    Within the last year, have you been afraid of your partner or ex-partner?: Patient refused    Within the last year, have you been humiliated or emotionally abused in other ways by your partner or ex-partner?: Patient refused    Within the last year, have you been kicked, hit, slapped, or otherwise physically hurt by your partner or ex-partner?: Patient refused    Within the last year, have you been raped or forced to have any kind of sexual activity by your partner or ex-partner?: Patient refused  Depression (PHQ2-9): Low  Risk (04/04/2023)   Depression (PHQ2-9)    PHQ-2 Score: 0  Alcohol  Screen: Not on file  Housing: Low Risk (08/10/2024)   Received from [facility]   Epic    What is your living situation today?: I have a steady place to live    Think about the place you live. Do you have problems with any of the following? Choose all that apply:: None/None on this list  Utilities: Low Risk (08/10/2024)   Received from [facility]   Utilities  In the past 12 months has the electric, gas, oil, or water company threatened to shut off services in your home? : No  Health Literacy: Not on file    Family History:   Family History  Problem Relation Age of Onset   Cancer Father    Diabetes Mother      ROS:  Please see the history of present illness.   Physical Exam/Data: Vitals:   12/11/24 2305 12/11/24 2341 12/12/24 0043 12/12/24 0414  BP: (!) 143/62 116/63 (!) 147/73 133/64  Pulse: 98 84 93 75  Resp: 19 12 19 16   Temp:  98.8 F (37.1 C) 98.3 F (36.8 C) 98.3 F (36.8 C)  TempSrc:      SpO2: 97% 96% 100% 99%  Weight:   73.3 kg     Intake/Output Summary (Last 24 hours) at 12/12/2024 0817 Last data filed at 12/12/2024 0500 Gross per 24 hour  Intake 637.48 ml  Output 1700 ml  Net -1062.52 ml      12/12/2024   12:43 AM 11/05/2024   11:31 AM 10/19/2024    4:31 PM  Last 3 Weights  Weight (lbs) 161 lb 9.6 oz 177 lb 191 lb  Weight (kg) 73.3 kg 80.287 kg 86.637 kg     Body mass index is 23.86 kg/m.  General: Chronically ill-appearing Neck: no JVD Cardiac:   RRR; no murmur  Lungs:  clear to auscultation bilaterally Abd: soft, nontender Ext: no edema Musculoskeletal: Right BKA Skin: warm and dry  Neuro: Oriented to person and place Psych: Unable to assess  EKG:  The following EKGs were personally reviewed: - Initial EKG on 12/11/2024 showed sinus tachycardia, rate 115 bpm, with LVH, ST depression in inferior leads and leads V5-V6, and prolonged QTc of 501 ms.  - Repeat EKG on  12/12/2024 showed normal sinus rhythm, rate 91 bpm, with new T wave inversions in precordial leads and prolonged QTc of 570s ms in setting of ischemic T waves.  Telemetry:  Telemetry was personally reviewed and demonstrates:  NSR  Relevant CV Studies:  Echocardiogram 07/29/2020: Impressions: 1. Left ventricular ejection fraction, by estimation, is 55 to 60%. The  left ventricle has normal function. The left ventricle has no regional  wall motion abnormalities. Left ventricular diastolic parameters are  indeterminate.   2. Right ventricular systolic function is normal. The right ventricular  size is normal.   3. Left atrial size was severely dilated.   4. The mitral valve is normal in structure. Mild mitral valve  regurgitation. No evidence of mitral stenosis.   5. The aortic valve is tricuspid. Aortic valve regurgitation is not  visualized. No aortic stenosis is present.   6. The inferior vena cava is normal in size with greater than 50%  respiratory variability, suggesting right atrial pressure of 3 mmHg.    Laboratory Data: High Sensitivity Troponin:   Recent Labs  Lab 12/11/24 1800 12/12/24 0301  TROPONINIHS 1,507* 2,137*   No results for input(s): TRNPT in the last 720 hours.    Chemistry Recent Labs  Lab 12/11/24 1452 12/11/24 1511 12/12/24 0301  NA 138 136  134* 134*  K 3.5 3.5  3.5 3.1*  CL 95* 94* 92*  CO2 29  --  28  GLUCOSE 151* 149* 128*  BUN 23 28* 17  CREATININE 7.15* 7.30* 4.63*  CALCIUM  8.9  --  8.1*  MG  --   --  1.5*  GFRNONAA 8*  --  13*  ANIONGAP 14  --  14    Recent Labs  Lab 12/11/24 1452 12/12/24 0301  PROT 6.0* 5.4*  ALBUMIN  3.6 3.1*  AST 23 23  ALT 9 9  ALKPHOS 54 46  BILITOT 1.1 1.2   Lipids No results for input(s): CHOL, TRIG, HDL, LABVLDL, LDLCALC, CHOLHDL in the last 168 hours.  Hematology Recent Labs  Lab 12/11/24 1452 12/11/24 1511 12/12/24 0301  WBC 8.1  --  4.8  RBC 3.60*  --  3.10*  HGB 10.8* 11.6*   11.2* 9.3*  HCT 32.4* 34.0*  33.0* 27.8*  MCV 90.0  --  89.7  MCH 30.0  --  30.0  MCHC 33.3  --  33.5  RDW 13.2  --  13.3  PLT 59*  --  44*   Thyroid  No results for input(s): TSH, FREET4 in the last 168 hours.  BNPNo results for input(s): BNP, PROBNP in the last 168 hours.  DDimer No results for input(s): DDIMER in the last 168 hours.  Radiology/Studies:  CT ABDOMEN PELVIS W CONTRAST Result Date: 12/11/2024 EXAM: CT ABDOMEN AND PELVIS WITH CONTRAST 12/11/2024 07:35:43 PM TECHNIQUE: CT of the abdomen and pelvis was performed with the administration of 75 mL of intravenous iohexol  (OMNIPAQUE ) 350 MG/ML injection. Multiplanar reformatted images are provided for review. Automated exposure control, iterative reconstruction, and/or weight-based adjustment of the mA/kV was utilized to reduce the radiation dose to as low as reasonably achievable. COMPARISON: 10/22/2018 CLINICAL HISTORY: ams FINDINGS: LOWER CHEST: No acute abnormality. LIVER: The liver is unremarkable. GALLBLADDER AND BILE DUCTS: Cholelithiasis, without associated inflammatory changes. No biliary ductal dilatation. SPLEEN: No acute abnormality. PANCREAS: 14 mm unilocular cyst in the pancreatic uncinate process (MS 35), likely a pseudocyst or side branch IPNM. ADRENAL GLANDS: No acute abnormality. KIDNEYS, URETERS AND BLADDER: Bilateral renal cysts, measuring up to 2.3 cm in the left upper kidney, benign (Bosniak 1). No follow-up is recommended. Per consensus, no follow-up is needed for simple Bosniak type 1 and 2 renal cysts, unless the patient has a malignancy history or risk factors. 14 mm hyperdense lesion in the left upper kidney (image 29), likely reflecting a benign hemorrhagic cyst, but indeterminate. No stones in the kidneys or ureters. No hydronephrosis. No perinephric or periureteral stranding. Mildly thick-walled bladder, likely reflecting chronic bladder obstruction. GI AND BOWEL: Stomach demonstrates no acute  abnormality. There is no bowel obstruction. Normal appendix (image 57). PERITONEUM AND RETROPERITONEUM: No ascites. No free air. VASCULATURE: Aorta is normal in caliber. Atherosclerotic calcifications of the abdominal aorta and branch vessels. LYMPH NODES: No lymphadenopathy. REPRODUCTIVE ORGANS: Prostatomegaly, suggesting BPH. BONES AND SOFT TISSUES: Mild degenerative changes of the upper lumbar spine. No acute osseous abnormality. No focal soft tissue abnormality. IMPRESSION: 1. No acute findings in the abdomen or pelvis. 2. Incidental 14 mm pancreatic uncinate process cyst, likely a pseudocyst or benign side branch IPMN. Recommend MRI or pancreas-protocol CT in 1 year, then every 2 years for a total of 5 years if stable, per ACR incidental pancreatic cyst guidance for age 60-79. 3. Indeterminate 14 mm hyperdense left upper renal lesion, likely reflecting a benign hemorrhagic cyst. Attention on follow-up is suggested. Electronically signed by: Pinkie Pebbles MD 12/11/2024 07:48 PM EST RP Workstation: [id number]   CT Angio Chest PE W and/or Wo Contrast Result Date: 12/11/2024 EXAM: CTA of the Chest with contrast for PE 12/11/2024 07:35:43 PM TECHNIQUE: CTA of the chest was performed after the administration of intravenous contrast. Multiplanar reformatted images are provided for review. MIP images are provided for review. Automated  exposure control, iterative reconstruction, and/or weight based adjustment of the mA/kV was utilized to reduce the radiation dose to as low as reasonably achievable. COMPARISON: 02/24/2020 CLINICAL HISTORY: Pulmonary embolism (PE) suspected, high prob. Unresponsive. Vomiting. FINDINGS: PULMONARY ARTERIES: Pulmonary arteries are adequately opacified for evaluation. No pulmonary embolism. Main pulmonary artery is normal in caliber. MEDIASTINUM: The heart demonstrates trace inferior pericardial effusion. There is no acute abnormality of the thoracic aorta. Thoracic aortic  atherosclerosis. LYMPH NODES: Small mediastinal nodes, including an 18 mm short axis subcarinal node, progressive from remote prior but possibly reactive. No hilar or axillary lymphadenopathy. LUNGS AND PLEURA: Interlobular septal thickening with scattered ground-glass opacity, suggesting mild interstitial edema. Peribronchovascular nodularity, most prominent in the left lower lobe, suggesting superimposed multifocal pneumonia. Mild centrilobular emphysematous changes. Small bilateral pleural effusions with associated compressive atelectasis in the bilateral lower lobes. No pneumothorax. UPPER ABDOMEN: Limited images of the upper abdomen are unremarkable. SOFT TISSUES AND BONES: No acute bone or soft tissue abnormality. IMPRESSION: 1. No evidence of pulmonary embolism. 2. Multifocal pneumonia, left lower lobe predominant. 3. Mild interstitial edema with small bilateral pleural effusions. 4. Enlarged subcarinal lymph node measuring 18 mm short axis, increased from remote prior; consider short-interval chest CT follow-up to ensure resolution after treatment of pneumonia. Electronically signed by: Pinkie Pebbles MD 12/11/2024 07:42 PM EST RP Workstation: [id number]   DG Chest Port 1 View Result Date: 12/11/2024 CLINICAL DATA:  Questionable sepsis - evaluate for abnormality EXAM: PORTABLE CHEST 1 VIEW COMPARISON:  March 07, 2021 FINDINGS: The cardiomediastinal silhouette is unchanged in contour. Small LEFT pleural effusion. No pneumothorax. Diffuse interstitial markings with increased peribronchial cuffing. Atherosclerotic calcifications. IMPRESSION: Constellation of findings are favored to reflect pulmonary edema with small LEFT pleural effusion. Differential considerations include atypical infection. Electronically Signed   By: Corean Salter M.D.   On: 12/11/2024 15:44   CT Head Wo Contrast Result Date: 12/11/2024 CLINICAL DATA:  Altered mental status. EXAM: CT HEAD WITHOUT CONTRAST TECHNIQUE: Contiguous  axial images were obtained from the base of the skull through the vertex without intravenous contrast. RADIATION DOSE REDUCTION: This exam was performed according to the departmental dose-optimization program which includes automated exposure control, adjustment of the mA and/or kV according to patient size and/or use of iterative reconstruction technique. COMPARISON:  None Available. FINDINGS: Brain: There is generalized cerebral atrophy with widening of the extra-axial spaces and ventricular dilatation. There are areas of decreased attenuation within the white matter tracts of the supratentorial brain, consistent with microvascular disease changes. Small, chronic bilateral thalamic lacunar infarcts are noted. A chronic infarct involving the body of the right caudate is also seen. Vascular: There is moderate to marked severity bilateral cavernous carotid artery calcification. Skull: Normal. Negative for fracture or focal lesion. Sinuses/Orbits: No acute finding. Other: None. IMPRESSION: 1. Generalized cerebral atrophy with chronic white matter small vessel ischemic changes. 2. Chronic bilateral thalamic lacunar infarcts. 3. Chronic infarct involving the body of the right caudate. 4. No acute intracranial abnormality. Electronically Signed   By: Suzen Dials M.D.   On: 12/11/2024 15:24     Assessment and Plan:  NSTEMI Patient presented with altered mental status after being found unconscious in his wheelchair with vomit on him. Initial EKG showed sinus tachycardia with LVH and ST depressions in inferior leads and leads V5-V6. Repeat EKG this morning showed normal sinus rhythm with new T wave inversions in pre-cardial leads. High-sensitivity troponin 1,507 >> 2,137.  CT chest suggestive of pneumonia.  He was febrile on presentation.   -  Will order an Echo. - Suspect likely type II MI in setting of suspected aspiration pneumonia, though cannot rule out type I MI.  However management would not change, as  he is not a candidate for heart catheterization or heparin  drip in setting of severe thrombocytopenia.  PAD S/p therectomy and angioplasty of right SFA and posterior tibial artery in 11/2020 with subsequent  below knee amputation in 02/2021 and then amputation of left great toe in 10/2022 in setting of osteomyelitis. Last lower extremity arterial ultrasound in 05/2024 showed 50-74% stenosis of left common femoral artery and SFA. ABI was 0.97.  - Home Plavix  on hold right now in setting of severe thrombocytopenia. - Can resume statin when above to take PO medications.   Hypertension BP mostly well controlled.  - Continue to monitor.  Prolonged QTc QTC 501 ms on initial EKG and then 570 ms on repeat EKG this morning in setting of new T wave inversions. - Potassium 3.1 and Magnesium  1.5 today.  - Magnesium  supplementation has already been ordered by primary team.  - Will defer potassium supplementation to Nephrology given ESRD. - Avoid AV nodal agents.  - Repeat EKG tomorrow.   Otherwise, per primary team: - CAP - Nausea/ vomiting - Anemia - Thrombocytopenia - Hyperlipidemia - Type 2 diabetes mellitus - ESRD - Failure to thrive  Risk Assessment/Risk Scores:  TIMI Risk Score for Unstable Angina or Non-ST Elevation MI:   The patient's TIMI risk score is 5, which indicates a 26% risk of all cause mortality, new or recurrent myocardial infarction or need for urgent revascularization in the next 14 days.{  For questions or updates, please contact [facility] Please consult [email or web address] for contact info under   Signed, Callie E Goodrich, PA-C  12/12/2024 8:17 AM   Patient seen and examined.  Agree with above documentation.  Duane Hall is a 68 year old male with a history of PAD, T2DM, ESRD are consulted for evaluation of NSTEMI at the request of Dr. Alfornia.  Presented to ED on 12/11/2024 with altered mental status, wife reports he was unresponsive with vomit on him.  She  reports she has had a poor quality of life recently and has not been able to keep hardly anything down without vomiting.  He has been discussing stopping dialysis.  On arrival to ED, he was febrile to 101.3 F, BP 137/72, pulse 114.  Labs notable for creatinine 7.1, hemoglobin 10.8, platelets 59 > 44, troponin 1500 > 2100.  CTPA showed no PE but multifocal pneumonia, mild interstitial edema.  Initial EKG showed sinus tachycardia, diffuse ST depressions, LVH.  Repeat EKG showed improvement in ST depressions. Patient seen and examined.   On exam, patient is oriented to person and place, regular rate and rhythm, no murmurs, diminished breath sounds, right BKA  Suspect troponin elevation represents type II MI in setting of suspected aspiration pneumonia.  However management would not change at this point even if type I MI, as he is not a candidate for cardiac catheterization or heparin  drip with severe thrombocytopenia.  Will check echocardiogram  Lonni LITTIE Nanas, MD        [1]  Allergies Allergen Reactions   Amoxicillin -Pot Clavulanate Diarrhea   Midodrine  Other (See Comments)    Other reaction(s): Urinary Sensation   Tape Other (See Comments)    Latex band aids cause blistering

## 2024-12-12 NOTE — Hospital Course (Signed)
 68 y.o. M with ESRD on HD TThS, HTN, DM, hx osteomyelitis of great toe, hep C and PVD s/p right BKA who presented with malaise, found to have pneumonia sepsis.

## 2024-12-13 ENCOUNTER — Inpatient Hospital Stay (HOSPITAL_COMMUNITY)

## 2024-12-13 DIAGNOSIS — I214 Non-ST elevation (NSTEMI) myocardial infarction: Secondary | ICD-10-CM

## 2024-12-13 LAB — COMPREHENSIVE METABOLIC PANEL WITH GFR
ALT: 10 U/L (ref 0–44)
AST: 20 U/L (ref 15–41)
Albumin: 2.9 g/dL — ABNORMAL LOW (ref 3.5–5.0)
Alkaline Phosphatase: 40 U/L (ref 38–126)
Anion gap: 14 (ref 5–15)
BUN: 42 mg/dL — ABNORMAL HIGH (ref 8–23)
CO2: 25 mmol/L (ref 22–32)
Calcium: 8.2 mg/dL — ABNORMAL LOW (ref 8.9–10.3)
Chloride: 95 mmol/L — ABNORMAL LOW (ref 98–111)
Creatinine, Ser: 6.57 mg/dL — ABNORMAL HIGH (ref 0.61–1.24)
GFR, Estimated: 9 mL/min — ABNORMAL LOW (ref >60)
Glucose, Bld: 142 mg/dL — ABNORMAL HIGH (ref 70–99)
Potassium: 3.1 mmol/L — ABNORMAL LOW (ref 3.5–5.1)
Sodium: 134 mmol/L — ABNORMAL LOW (ref 135–145)
Total Bilirubin: 1.1 mg/dL (ref 0.0–1.2)
Total Protein: 5 g/dL — ABNORMAL LOW (ref 6.5–8.1)

## 2024-12-13 LAB — ECHOCARDIOGRAM COMPLETE
AR max vel: 2.69 cm2
AV Area VTI: 2.68 cm2
AV Area mean vel: 2.67 cm2
AV Mean grad: 3 mmHg
AV Peak grad: 5.5 mmHg
Ao pk vel: 1.17 m/s
Area-P 1/2: 5.54 cm2
Calc EF: 40.6 %
MV VTI: 2.06 cm2
S' Lateral: 4.2 cm
Single Plane A2C EF: 35.7 %
Single Plane A4C EF: 40.7 %
Weight: 2585.55 [oz_av]

## 2024-12-13 LAB — GLUCOSE, CAPILLARY
Glucose-Capillary: 132 mg/dL — ABNORMAL HIGH (ref 70–99)
Glucose-Capillary: 139 mg/dL — ABNORMAL HIGH (ref 70–99)
Glucose-Capillary: 140 mg/dL — ABNORMAL HIGH (ref 70–99)
Glucose-Capillary: 143 mg/dL — ABNORMAL HIGH (ref 70–99)
Glucose-Capillary: 151 mg/dL — ABNORMAL HIGH (ref 70–99)
Glucose-Capillary: 159 mg/dL — ABNORMAL HIGH (ref 70–99)
Glucose-Capillary: 174 mg/dL — ABNORMAL HIGH (ref 70–99)

## 2024-12-13 LAB — CBC
HCT: 26.1 % — ABNORMAL LOW (ref 39.0–52.0)
Hemoglobin: 8.9 g/dL — ABNORMAL LOW (ref 13.0–17.0)
MCH: 30.9 pg (ref 26.0–34.0)
MCHC: 34.1 g/dL (ref 30.0–36.0)
MCV: 90.6 fL (ref 80.0–100.0)
Platelets: 54 K/uL — ABNORMAL LOW (ref 150–400)
RBC: 2.88 MIL/uL — ABNORMAL LOW (ref 4.22–5.81)
RDW: 13.1 % (ref 11.5–15.5)
WBC: 4.2 K/uL (ref 4.0–10.5)
nRBC: 0 % (ref 0.0–0.2)

## 2024-12-13 LAB — PROLACTIN: Prolactin: 13.7 ng/mL (ref 3.6–25.2)

## 2024-12-13 LAB — MAGNESIUM: Magnesium: 1.9 mg/dL (ref 1.7–2.4)

## 2024-12-13 MED ORDER — CARVEDILOL 6.25 MG PO TABS
6.2500 mg | ORAL_TABLET | Freq: Two times a day (BID) | ORAL | Status: DC
  Filled 2024-12-13: qty 1

## 2024-12-13 MED ORDER — LOSARTAN POTASSIUM 25 MG PO TABS
25.0000 mg | ORAL_TABLET | Freq: Every day | ORAL | Status: DC
  Filled 2024-12-13: qty 1

## 2024-12-13 MED ORDER — ENSURE PLUS HIGH PROTEIN PO LIQD
237.0000 mL | Freq: Two times a day (BID) | ORAL | Status: DC
  Administered 2024-12-14: 15:00:00 237 mL via ORAL

## 2024-12-13 NOTE — Plan of Care (Signed)
° ° °  Progress Note from the Palliative Medicine Team at [facility]   Patient Name: Duane Hall        Date: 12/13/2024 DOB: [birth year]  Age: 68 y.o. MRN#: [id number] Attending Physician: Jonel Lonni SQUIBB, * Primary Care Physician: Lazoff, Shawn P, DO Admit Date: 12/11/2024   Spoke with wife, plan is for family meeting tomorrow at 5 pm.   Son will likely be present .     Leaning toward a full comfort path.   No charge   Ronal Plants NP  Palliative Medicine Team Team Phone # [phone] Pager [phone]

## 2024-12-13 NOTE — Progress Notes (Signed)
 Rounding Note   Patient Name: Duane Hall Date of Encounter: 12/13/2024  [facility] Cardiologist: None   Subjective - No acute events overnight - Patient without complaints today   Scheduled Meds:  aspirin  EC  81 mg Oral Daily   Chlorhexidine  Gluconate Cloth  6 each Topical Q0600   insulin  aspart  0-6 Units Subcutaneous Q4H   mouth rinse  15 mL Mouth Rinse 4 times per day   pantoprazole  (PROTONIX ) IV  40 mg Intravenous Q12H   rosuvastatin   10 mg Oral Daily   sodium chloride  flush  3 mL Intravenous Q12H   Continuous Infusions:  ceFEPime  (MAXIPIME ) IV Stopped (12/12/24 1200)   metronidazole  500 mg (12/13/24 0548)   PRN Meds: mouth rinse   Vital Signs  Vitals:   12/12/24 1630 12/12/24 2107 12/13/24 0453 12/13/24 0744  BP: (!) 141/64 137/62 130/78 (!) 150/67  Pulse: 90 86 83 86  Resp: 16 18 19    Temp: 98.6 F (37 C) 98.4 F (36.9 C) (!) 97.5 F (36.4 C) (!) 97.5 F (36.4 C)  TempSrc:  Oral Oral Oral  SpO2: 94% 99% 91% 91%  Weight:        Intake/Output Summary (Last 24 hours) at 12/13/2024 1030 Last data filed at 12/13/2024 0230 Gross per 24 hour  Intake 240 ml  Output --  Net 240 ml      12/12/2024   12:43 AM 11/05/2024   11:31 AM 10/19/2024    4:31 PM  Last 3 Weights  Weight (lbs) 161 lb 9.6 oz 177 lb 191 lb  Weight (kg) 73.3 kg 80.287 kg 86.637 kg      Telemetry NSR- Personally Reviewed  ECG  No ECG  Physical Exam  GEN: No acute distress.   Neck: No JVD Cardiac: RRR, no murmur, rubs, or gallops.  Respiratory: Clear to auscultation bilaterally. GI: Soft, nontender, non-distended  MS: No edema; R BKA, left great toe amputated. Neuro:  Nonfocal  Psych: Seems confused, but alert; refused to answer orientation questions  Labs High Sensitivity Troponin:   Recent Labs  Lab 12/11/24 1800 12/12/24 0301  TROPONINIHS 1,507* 2,137*   No results for input(s): TRNPT in the last 720 hours.     Chemistry Recent Labs  Lab  12/11/24 1452 12/11/24 1511 12/12/24 0301 12/13/24 0347  NA 138 136  134* 134* 134*  K 3.5 3.5  3.5 3.1* 3.1*  CL 95* 94* 92* 95*  CO2 29  --  28 25  GLUCOSE 151* 149* 128* 142*  BUN 23 28* 17 42*  CREATININE 7.15* 7.30* 4.63* 6.57*  CALCIUM  8.9  --  8.1* 8.2*  MG  --   --  1.5* 1.9  PROT 6.0*  --  5.4* 5.0*  ALBUMIN  3.6  --  3.1* 2.9*  AST 23  --  23 20  ALT 9  --  9 10  ALKPHOS 54  --  46 40  BILITOT 1.1  --  1.2 1.1  GFRNONAA 8*  --  13* 9*  ANIONGAP 14  --  14 14    Lipids No results for input(s): CHOL, TRIG, HDL, LABVLDL, LDLCALC, CHOLHDL in the last 168 hours.  Hematology Recent Labs  Lab 12/11/24 1452 12/11/24 1511 12/12/24 0301 12/13/24 0347  WBC 8.1  --  4.8 4.2  RBC 3.60*  --  3.10* 2.88*  HGB 10.8* 11.6*  11.2* 9.3* 8.9*  HCT 32.4* 34.0*  33.0* 27.8* 26.1*  MCV 90.0  --  89.7 90.6  MCH  30.0  --  30.0 30.9  MCHC 33.3  --  33.5 34.1  RDW 13.2  --  13.3 13.1  PLT 59*  --  44* 54*   Thyroid  No results for input(s): TSH, FREET4 in the last 168 hours.  BNPNo results for input(s): BNP, PROBNP in the last 168 hours.  DDimer No results for input(s): DDIMER in the last 168 hours.   Radiology  MR BRAIN WO CONTRAST Result Date: 12/12/2024 EXAM: MRI BRAIN WITHOUT CONTRAST 12/12/2024 01:45:26 PM TECHNIQUE: Multiplanar multisequence MRI of the head/brain was performed without the administration of intravenous contrast. The patient discontinued the examination prior to completion. Axial and coronal DWI, sagittal T1, axial T2, and axial FLAIR sequences were obtained and are motion degraded, including severe motion on the FLAIR sequence despite repeat imaging. COMPARISON: Head CT 12/11/2024. CLINICAL HISTORY: Mental status change, unknown cause. FINDINGS: BRAIN AND VENTRICLES: Diffusion weighted imaging is of acceptable quality, and no acute infarct is evident. No significant intracranial mass effect/midline shift, hydrocephalus, or extra-axial fluid  collection is identified. Patchy T2 hyperintensities in the cerebral white matter bilaterally are advanced for age and nonspecific but compatible with chronic small vessel ischemic disease. There are chronic lacunar infarcts in the deep cerebral white matter, deep gray nuclei, and cerebellum. There is mild cerebral atrophy. Major intracranial vascular flow voids are preserved. ORBITS: Bilateral cataract extraction. SINUSES AND MASTOIDS: Small fluid level in the right sphenoid sinus. Trace right mastoid fluid. BONES AND SOFT TISSUES: Normal marrow signal. No acute soft tissue abnormality. IMPRESSION: 1. Motion degraded, incomplete examination. 2. No acute infarct. 3. Age-advanced chronic small vessel ischemic disease. Electronically signed by: Dasie Hamburg MD 12/12/2024 02:41 PM EST RP Workstation: [id number]   CT ABDOMEN PELVIS W CONTRAST Result Date: 12/11/2024 EXAM: CT ABDOMEN AND PELVIS WITH CONTRAST 12/11/2024 07:35:43 PM TECHNIQUE: CT of the abdomen and pelvis was performed with the administration of 75 mL of intravenous iohexol  (OMNIPAQUE ) 350 MG/ML injection. Multiplanar reformatted images are provided for review. Automated exposure control, iterative reconstruction, and/or weight-based adjustment of the mA/kV was utilized to reduce the radiation dose to as low as reasonably achievable. COMPARISON: 10/22/2018 CLINICAL HISTORY: ams FINDINGS: LOWER CHEST: No acute abnormality. LIVER: The liver is unremarkable. GALLBLADDER AND BILE DUCTS: Cholelithiasis, without associated inflammatory changes. No biliary ductal dilatation. SPLEEN: No acute abnormality. PANCREAS: 14 mm unilocular cyst in the pancreatic uncinate process (MS 35), likely a pseudocyst or side branch IPNM. ADRENAL GLANDS: No acute abnormality. KIDNEYS, URETERS AND BLADDER: Bilateral renal cysts, measuring up to 2.3 cm in the left upper kidney, benign (Bosniak 1). No follow-up is recommended. Per consensus, no follow-up is needed for simple Bosniak  type 1 and 2 renal cysts, unless the patient has a malignancy history or risk factors. 14 mm hyperdense lesion in the left upper kidney (image 29), likely reflecting a benign hemorrhagic cyst, but indeterminate. No stones in the kidneys or ureters. No hydronephrosis. No perinephric or periureteral stranding. Mildly thick-walled bladder, likely reflecting chronic bladder obstruction. GI AND BOWEL: Stomach demonstrates no acute abnormality. There is no bowel obstruction. Normal appendix (image 57). PERITONEUM AND RETROPERITONEUM: No ascites. No free air. VASCULATURE: Aorta is normal in caliber. Atherosclerotic calcifications of the abdominal aorta and branch vessels. LYMPH NODES: No lymphadenopathy. REPRODUCTIVE ORGANS: Prostatomegaly, suggesting BPH. BONES AND SOFT TISSUES: Mild degenerative changes of the upper lumbar spine. No acute osseous abnormality. No focal soft tissue abnormality. IMPRESSION: 1. No acute findings in the abdomen or pelvis. 2. Incidental 14 mm pancreatic uncinate process  cyst, likely a pseudocyst or benign side branch IPMN. Recommend MRI or pancreas-protocol CT in 1 year, then every 2 years for a total of 5 years if stable, per ACR incidental pancreatic cyst guidance for age 38-79. 3. Indeterminate 14 mm hyperdense left upper renal lesion, likely reflecting a benign hemorrhagic cyst. Attention on follow-up is suggested. Electronically signed by: Pinkie Pebbles MD 12/11/2024 07:48 PM EST RP Workstation: [id number]   CT Angio Chest PE W and/or Wo Contrast Result Date: 12/11/2024 EXAM: CTA of the Chest with contrast for PE 12/11/2024 07:35:43 PM TECHNIQUE: CTA of the chest was performed after the administration of intravenous contrast. Multiplanar reformatted images are provided for review. MIP images are provided for review. Automated exposure control, iterative reconstruction, and/or weight based adjustment of the mA/kV was utilized to reduce the radiation dose to as low as reasonably  achievable. COMPARISON: 02/24/2020 CLINICAL HISTORY: Pulmonary embolism (PE) suspected, high prob. Unresponsive. Vomiting. FINDINGS: PULMONARY ARTERIES: Pulmonary arteries are adequately opacified for evaluation. No pulmonary embolism. Main pulmonary artery is normal in caliber. MEDIASTINUM: The heart demonstrates trace inferior pericardial effusion. There is no acute abnormality of the thoracic aorta. Thoracic aortic atherosclerosis. LYMPH NODES: Small mediastinal nodes, including an 18 mm short axis subcarinal node, progressive from remote prior but possibly reactive. No hilar or axillary lymphadenopathy. LUNGS AND PLEURA: Interlobular septal thickening with scattered ground-glass opacity, suggesting mild interstitial edema. Peribronchovascular nodularity, most prominent in the left lower lobe, suggesting superimposed multifocal pneumonia. Mild centrilobular emphysematous changes. Small bilateral pleural effusions with associated compressive atelectasis in the bilateral lower lobes. No pneumothorax. UPPER ABDOMEN: Limited images of the upper abdomen are unremarkable. SOFT TISSUES AND BONES: No acute bone or soft tissue abnormality. IMPRESSION: 1. No evidence of pulmonary embolism. 2. Multifocal pneumonia, left lower lobe predominant. 3. Mild interstitial edema with small bilateral pleural effusions. 4. Enlarged subcarinal lymph node measuring 18 mm short axis, increased from remote prior; consider short-interval chest CT follow-up to ensure resolution after treatment of pneumonia. Electronically signed by: Pinkie Pebbles MD 12/11/2024 07:42 PM EST RP Workstation: [id number]   DG Chest Port 1 View Result Date: 12/11/2024 CLINICAL DATA:  Questionable sepsis - evaluate for abnormality EXAM: PORTABLE CHEST 1 VIEW COMPARISON:  March 07, 2021 FINDINGS: The cardiomediastinal silhouette is unchanged in contour. Small LEFT pleural effusion. No pneumothorax. Diffuse interstitial markings with increased peribronchial  cuffing. Atherosclerotic calcifications. IMPRESSION: Constellation of findings are favored to reflect pulmonary edema with small LEFT pleural effusion. Differential considerations include atypical infection. Electronically Signed   By: Corean Salter M.D.   On: 12/11/2024 15:44   CT Head Wo Contrast Result Date: 12/11/2024 CLINICAL DATA:  Altered mental status. EXAM: CT HEAD WITHOUT CONTRAST TECHNIQUE: Contiguous axial images were obtained from the base of the skull through the vertex without intravenous contrast. RADIATION DOSE REDUCTION: This exam was performed according to the departmental dose-optimization program which includes automated exposure control, adjustment of the mA and/or kV according to patient size and/or use of iterative reconstruction technique. COMPARISON:  None Available. FINDINGS: Brain: There is generalized cerebral atrophy with widening of the extra-axial spaces and ventricular dilatation. There are areas of decreased attenuation within the white matter tracts of the supratentorial brain, consistent with microvascular disease changes. Small, chronic bilateral thalamic lacunar infarcts are noted. A chronic infarct involving the body of the right caudate is also seen. Vascular: There is moderate to marked severity bilateral cavernous carotid artery calcification. Skull: Normal. Negative for fracture or focal lesion. Sinuses/Orbits: No acute finding. Other: None.  IMPRESSION: 1. Generalized cerebral atrophy with chronic white matter small vessel ischemic changes. 2. Chronic bilateral thalamic lacunar infarcts. 3. Chronic infarct involving the body of the right caudate. 4. No acute intracranial abnormality. Electronically Signed   By: Suzen Dials M.D.   On: 12/11/2024 15:24    Cardiac Studies   TTE 12/13/24:  IMPRESSIONS     1. Left ventricular ejection fraction, by estimation, is 40 to 45%. Left  ventricular ejection fraction by 2D MOD biplane is 40.6 %. The left   ventricle has mildly decreased function. The left ventricle demonstrates  regional wall motion abnormalities (see   scoring diagram/findings for description). Left ventricular diastolic  parameters are consistent with Grade I diastolic dysfunction (impaired  relaxation).   2. Right ventricular systolic function is normal. The right ventricular  size is normal.   3. The mitral valve is normal in structure. No evidence of mitral valve  regurgitation. No evidence of mitral stenosis.   4. The aortic valve is normal in structure. Aortic valve regurgitation is  not visualized. No aortic stenosis is present.   5. The inferior vena cava is normal in size with <50% respiratory  variability, suggesting right atrial pressure of 8 mmHg.   Patient Profile   Mr. Minihan is a 68 y.o. male with a PMH of HTN, PAD s/p amputations, ESRD on iHD TThS, DM2, osteomyelitis, hep C and anemia of CKD who was admitted for AMS 2/2 PNA and found to have an NSTEMI.  Cardiology is consulted for the evaluation of NSTEMI.  Assessment & Plan   #NSTEMI, Type 1 vs Type 2 - Patient presented with AMS in the setting of pneumonia and found to have a markedly elevated troponin >2000. - TTE on 12/15 demonstrates a newly mildly reduced LVEF with wall motion abnormalities. - The patient has no cardiopulmonary symptoms; however, I suspect that he had a true likely type I MI. - Unfortunately the patient has significant thrombocytopenia which prohibits left heart catheterization and aggressive anticoagulation at this time. - Furthermore, he appears confused so discussing the nuances and risk associated with anticoagulation is challenging at this time.  Plus the patient is DNAR. - I think the best case scenario would be medical management with DAPT for 12 months for treatment of ACS; however, I want to see at least 1 more day of either stable or uptrending platelet count before committing him to Plavix . Continue aspirin  81 mg  daily Plan to load with Plavix  tomorrow if platelet count remains stable or is uptrending Not a great candidate for LHC given thrombocytopenia and DNAR status.  Also no current cardiopulmonary symptoms.  #New HFmrEF - TTE on 12/15 demonstrates a mildly reduced EF 40 to 45%. - This likely represents an ischemic cardiomyopathy in the setting of ACS. - He appears well compensated from a heart failure standpoint and is asymptomatic. - Will start some light GDMT for treatment. Start carvedilol  6.25 mg twice daily Start losartan  25 mg daily (he is ESRD so safe to use) Spironolactone and SGLT2 inhibitors are contraindicated in the setting of ESRD  #HTN - BP is a bit elevated today. - Will treat blood pressure by way of treating CHF  #PAD - On Plavix  as an outpatient, but currently treating ACS with aspirin . - I am optimistic to better restart Plavix  for DAPT treatment pending stability of thrombocytopenia - Restart statin once safe from a p.o. perspective  #Sepsis 2/2 PNA - Treatment per primary      For questions or  updates, please contact [facility] Please consult [email or web address] for contact info under       Signed, Georganna Archer, MD  12/13/2024, 10:30 AM

## 2024-12-13 NOTE — Progress Notes (Signed)
 [facility] Progress Note  Subjective:  Seen in room Pt refused HD today and his wife is in support of his decision  Presentation summary: 68 y.o. year-old w/ PMH as below who presented to ED for being found poorly responsive at home. Has has n/v as well. In ED BS was 200s, had multiple episodes of emesis. Requiring 2L of O2 which is new. In ED Bp was 137/72, HR 115, RR 20-26, tempo 101.3, then 99 then 98.5.  94-100% on RA. Labs showed K+ 3.5, CO2 29, bun 28, creat 7.30, Ca 8.9, alb 3.6, wbc 8k, Hb 10.8, LA 1.2. Troponin 1,507. CT abd/pelv showed no acute findings. CTA chest showed no PE, LLL pna, diffuse GG changes c/w edema. CXR showed IS pulm edema. Pt was admitted. We were asked to see for dialysis. Pt seen in ED. Pt's wife gives the hx. She notes 1-2 days of being very tired and lethargic, worst last night then worsening today again. At home is usually in the [facility], will walk sometimes using a prosthetic if going out. Lately the dialysis has been strictly pulling only 0.3L since he had been cramping and they were trying to avoid that. Recently also has poor appetite w/ vomiting and diarrhea. The wife does his home HD at 3hrs, four times per week. Pt overall is failing to thrive, they have a MOST form and pt is DNR-limited.  The wife states that he would want us  to try antibiotics for the infection, but if things get worse they would want to switch to comfort care (the family has just discussed this amongst themselves).   Vitals:   12/12/24 1630 12/12/24 2107 12/13/24 0453 12/13/24 0744  BP: (!) 141/64 137/62 130/78 (!) 150/67  Pulse: 90 86 83 86  Resp: 16 18 19    Temp: 98.6 F (37 C) 98.4 F (36.9 C) (!) 97.5 F (36.4 C) (!) 97.5 F (36.4 C)  TempSrc:  Oral Oral Oral  SpO2: 94% 99% 91% 91%  Weight:        Exam: Gen lethargic Sclera anicteric, throat clear  No jvd or bruits Chest clear bilat to bases RRR no MRG Abd soft ntnd no mass or ascites +bs Ext 1+ bilat pretib LE  edema  Neuro lethargic    AVF +bruit    Home bp meds: None     OP HD: [facility] home HD MTTF  3h  76.5kg  Heparin  4000  pulls only 0.3 L to cover rinseback    BP 137/73, HR 114, RR 22-27, temp 101.3   K+ 3.5, bun 23, creat 7.1, alb 3.6, LFT's okay, wbc 8K, Hb 10.8 Flu / covid / rsv neg by pcr  Given IV cefepime , flagyl  and vanc in ED    Assessment/ Plan: Fever/ lethargy/ fatigue: suspected LLL PNA as seen on CTA chest. Got IV abx and had HD x 1 with some volume removal. MS improved significantly.  DNR-limited: per the wife, per their MOST form ^Troponins: per pmd ESRD: on HD MTTF. Had HD here Sat night while unresponsive. Pt alert now and is refusing dialysis. Have cancelled dialysis for today.  BP: bp's stable 120- 145/ 65-75.  Volume: some LE edema and +pulm edema by imaging initially. Got 1.75 L off w/ HD Sat evening.  Anemia of esrd: Hb 10-12 here, follow.   GOC: pt is DNR. He is much more alert and refusing HD today. He may not want to do dialysis anymore- I haven't discussed this with him. If so,  would recommend hospice care.     Myer Fret MD  [facility] 12/13/2024, 3:43 PM  Recent Labs  Lab 12/12/24 0301 12/13/24 0347  HGB 9.3* 8.9*  ALBUMIN  3.1* 2.9*  CALCIUM  8.1* 8.2*  PHOS 3.6  --   CREATININE 4.63* 6.57*  K 3.1* 3.1*   No results for input(s): IRON , TIBC, FERRITIN in the last 168 hours. Inpatient medications:  aspirin  EC  81 mg Oral Daily   carvedilol   6.25 mg Oral BID WC   Chlorhexidine  Gluconate Cloth  6 each Topical Q0600   feeding supplement  237 mL Oral BID BM   insulin  aspart  0-6 Units Subcutaneous Q4H   losartan   25 mg Oral Daily   mouth rinse  15 mL Mouth Rinse 4 times per day   pantoprazole  (PROTONIX ) IV  40 mg Intravenous Q12H   rosuvastatin   10 mg Oral Daily   sodium chloride  flush  3 mL Intravenous Q12H    ceFEPime  (MAXIPIME ) IV Stopped (12/12/24 1200)   metronidazole  500 mg (12/13/24 0548)   mouth rinse

## 2024-12-13 NOTE — Progress Notes (Signed)
 This patient refused to have dialysis today, Md Geralynn was notified.

## 2024-12-13 NOTE — Progress Notes (Signed)
°  Progress Note   Patient: Duane Hall FMW:[id number] DOB: 11/02/1956 DOA: 12/11/2024     2 DOS: the patient was seen and examined on 12/13/2024 at 9:50AM      Brief hospital course: 68 y.o. M with ESRD on HD TThS, HTN, DM, hx osteomyelitis of great toe, hep C and PVD s/p right BKA who presented with malaise, found to have pneumonia sepsis.     Assessment and Plan: Pneumonia sepsis, unknown organism Presented with tachycardia, tachypnea, thrombocytopenia and encephalopathy due to sepsis.  CXR showed pneumonia. Report of vomiting while laying prone, suspect pneumonia due to aspiration.  Admitted on broad spectrum antibiotics.  MRSA nares negative, vancomycin  stopped. - Continue cefepime  and Flagyl     Acute metabolic encephalopathy At baseline patient has memory impairment but is independent with self-cares, conversational and oriented x3.  Here, he has been confused and disoriented due to sepsis.   NSTEMI Troponins noted to be elevated.  Echo obtained, showed reduced EF and RWMA. Myocardial infarction ruled in.  Discussed with family, it is against patient's wishes to treat invasively.  His platelets preclude heparin . - Supportive care   ESRD - Consult Nephrology - Consult palliative care   Diabetes Glucose normal - Continue sign scale corrections   Peripheral vascular disease Hypertension Blood pressure elevated - Continue aspirin , Crestor  - Monitor platelets, hold Plavix    Anemia of chronic kidney disease No clinical bleeding observed or reported, hemoglobin stable - Trend CBC   Thrombocytopenia Platelets unchanged, suspect sepsis - Trend CBC   Failure to thrive - Consult palliative care   Hypokalemia Hypomagnesemia Supplemented         Subjective: Patient refusing dialysis today.  Patient refusing antibiotics today.  He remains delirious.  No fever, no respiratory distress.  Denies complaints, but seems encephalopathic and  unreliable     Physical Exam: BP (!) 150/67 (BP Location: Left Arm)   Pulse 86   Temp (!) 97.5 F (36.4 C) (Oral)   Resp 19   Wt 73.3 kg   SpO2 91%   BMI 23.86 kg/m   Thin adult male, lying in bed, appears dazed, weak RRR, no murmurs, no peripheral edema Respiratory normal, lungs clear without rales or wheezes Abdomen with guarding, no clear pain to palpation, no distention Inattentive to conversation, psychomotor slowing noted, face symmetric, speech fluent but slow, severe generalized weakness, does not follow commands well     Data Reviewed: Discussed with palliative care Basic metabolic panel shows persistent hypokalemia, elevated creatinine consistent with ESRD CBC shows mild anemia, stable thrombocytopenia Echocardiogram shows reduced EF with regional wall motion abnormalities    Family Communication: Wife    Disposition: Status is: Inpatient         Author: [name], MD 12/13/2024 2:18 PM  For on call review [email or web address].

## 2024-12-13 NOTE — Progress Notes (Signed)
 Patient refused majority of medications and interventions today. Dr. Jonel and wife updated throughout shift. Once wife arrives to bedside, states she would like to hold off on telemetry, BG checks and IV antibiotics at this time. Patient remains alert to self and intermittently confused. Pleasant for this RN throughout shift, but was persistent about refusing medications and HD today. Patient was able to consume a small portion of food from home today, otherwise did not eat, even when encouraged. Patient remains DNR-Limited with comfort measures, according to wife.

## 2024-12-13 NOTE — Progress Notes (Addendum)
 Pt does home HD 4x week with [facility]. Informed clinic that pt is here. Will continue to assist as needed.    Lavanda Arpita Fentress  Dialysis Navigator  [phone]

## 2024-12-13 NOTE — TOC CM/SW Note (Signed)
 Transition of Care [facility]) - Inpatient Brief Assessment   Patient Details  Name: Johnjoseph Rolfe MRN: [id number] Date of Birth: 01-Apr-1956  Transition of Care [facility]) CM/SW Contact:    Tom-Johnson, Harvest Muskrat, RN Phone Number: 12/13/2024, 3:54 PM   Clinical Narrative:  Patient presented to the ED after being found unresponsive at home by his wife Admitted with Altered Mental Status. Chest X-ray showed Pulmonary Edema with Small Lt Pleural Effusion. Found to have Sepsis 2/2 PNA, NSTEMI, Cardiology consulted. On IV abx. Patient has hx of HTN, DM, Osteomyelitis, ESRD on HD TTS schedule, Hep C and PVD s/p Rt BKA.   Patient declined HD today with wife agreeing. Patient's wife [name] does his home HD. [name] requesting to try IV abx for the infection and if no improvement, patient will transition to Comfort Care. MD and Nephrology aware of Katherine's request.   CM will continue to follow as patient progresses with care towards discharge.     Transition of Care Asessment:

## 2024-12-14 DIAGNOSIS — I214 Non-ST elevation (NSTEMI) myocardial infarction: Secondary | ICD-10-CM | POA: Diagnosis not present

## 2024-12-14 LAB — GLUCOSE, CAPILLARY
Glucose-Capillary: 141 mg/dL — ABNORMAL HIGH (ref 70–99)
Glucose-Capillary: 144 mg/dL — ABNORMAL HIGH (ref 70–99)
Glucose-Capillary: 145 mg/dL — ABNORMAL HIGH (ref 70–99)
Glucose-Capillary: 158 mg/dL — ABNORMAL HIGH (ref 70–99)

## 2024-12-14 LAB — CBC
HCT: 27.6 % — ABNORMAL LOW (ref 39.0–52.0)
Hemoglobin: 9.4 g/dL — ABNORMAL LOW (ref 13.0–17.0)
MCH: 30 pg (ref 26.0–34.0)
MCHC: 34.1 g/dL (ref 30.0–36.0)
MCV: 88.2 fL (ref 80.0–100.0)
Platelets: 58 K/uL — ABNORMAL LOW (ref 150–400)
RBC: 3.13 MIL/uL — ABNORMAL LOW (ref 4.22–5.81)
RDW: 13.2 % (ref 11.5–15.5)
WBC: 3.6 K/uL — ABNORMAL LOW (ref 4.0–10.5)
nRBC: 0 % (ref 0.0–0.2)

## 2024-12-14 LAB — COMPREHENSIVE METABOLIC PANEL WITH GFR
ALT: 12 U/L (ref 0–44)
AST: 23 U/L (ref 15–41)
Albumin: 3.2 g/dL — ABNORMAL LOW (ref 3.5–5.0)
Alkaline Phosphatase: 44 U/L (ref 38–126)
Anion gap: 16 — ABNORMAL HIGH (ref 5–15)
BUN: 57 mg/dL — ABNORMAL HIGH (ref 8–23)
CO2: 26 mmol/L (ref 22–32)
Calcium: 8.1 mg/dL — ABNORMAL LOW (ref 8.9–10.3)
Chloride: 95 mmol/L — ABNORMAL LOW (ref 98–111)
Creatinine, Ser: 8.2 mg/dL — ABNORMAL HIGH (ref 0.61–1.24)
GFR, Estimated: 7 mL/min — ABNORMAL LOW (ref >60)
Glucose, Bld: 139 mg/dL — ABNORMAL HIGH (ref 70–99)
Potassium: 3 mmol/L — ABNORMAL LOW (ref 3.5–5.1)
Sodium: 137 mmol/L (ref 135–145)
Total Bilirubin: 1.6 mg/dL — ABNORMAL HIGH (ref 0.0–1.2)
Total Protein: 5.4 g/dL — ABNORMAL LOW (ref 6.5–8.1)

## 2024-12-14 LAB — HEPATITIS B SURFACE ANTIBODY, QUANTITATIVE: Hep B S AB Quant (Post): <3.5 m[IU]/mL — ABNORMAL LOW

## 2024-12-14 MED ORDER — BIOTENE DRY MOUTH MT LIQD: 15.0000 mL | OROMUCOSAL | Status: DC | PRN

## 2024-12-14 MED ORDER — SODIUM CHLORIDE (PF) 0.9 % IJ SOLN
INTRAMUSCULAR | Status: AC
  Filled 2024-12-14: qty 10

## 2024-12-14 MED ORDER — POTASSIUM CHLORIDE CRYS ER 20 MEQ PO TBCR
20.0000 meq | EXTENDED_RELEASE_TABLET | Freq: Once | ORAL | Status: DC
  Filled 2024-12-14: qty 1

## 2024-12-14 MED ORDER — POLYVINYL ALCOHOL 1.4 % OP SOLN: 1.0000 [drp] | Freq: Four times a day (QID) | OPHTHALMIC | Status: DC | PRN

## 2024-12-14 MED ORDER — HYDROMORPHONE HCL 1 MG/ML IJ SOLN: 0.5000 mg | INTRAMUSCULAR | Status: DC | PRN

## 2024-12-14 MED ORDER — HALOPERIDOL 1 MG PO TABS: 0.5000 mg | ORAL_TABLET | ORAL | Status: DC | PRN

## 2024-12-14 MED ORDER — ONDANSETRON 4 MG PO TBDP: 4.0000 mg | ORAL_TABLET | Freq: Four times a day (QID) | ORAL | Status: DC | PRN

## 2024-12-14 MED ORDER — GLYCOPYRROLATE 0.2 MG/ML IJ SOLN: 0.2000 mg | INTRAMUSCULAR | Status: DC | PRN

## 2024-12-14 MED ORDER — LORAZEPAM 2 MG/ML IJ SOLN
1.0000 mg | INTRAMUSCULAR | Status: DC | PRN
  Administered 2024-12-14 – 2024-12-16 (×9): 1 mg via INTRAVENOUS
  Filled 2024-12-14 (×10): qty 1

## 2024-12-14 MED ORDER — ACETAMINOPHEN 325 MG PO TABS: 650.0000 mg | ORAL_TABLET | Freq: Four times a day (QID) | ORAL | Status: DC | PRN

## 2024-12-14 MED ORDER — ACETAMINOPHEN 650 MG RE SUPP: 650.0000 mg | Freq: Four times a day (QID) | RECTAL | Status: DC | PRN

## 2024-12-14 MED ORDER — GLYCOPYRROLATE 1 MG PO TABS: 1.0000 mg | ORAL_TABLET | ORAL | Status: DC | PRN

## 2024-12-14 MED ORDER — HALOPERIDOL LACTATE 5 MG/ML IJ SOLN
0.5000 mg | INTRAMUSCULAR | Status: DC | PRN
  Administered 2024-12-15: 07:00:00 0.5 mg via INTRAVENOUS
  Filled 2024-12-14 (×2): qty 1

## 2024-12-14 MED ORDER — ONDANSETRON HCL 4 MG/2ML IJ SOLN: 4.0000 mg | Freq: Four times a day (QID) | INTRAMUSCULAR | Status: DC | PRN

## 2024-12-14 MED ORDER — HALOPERIDOL LACTATE 2 MG/ML PO CONC: 0.5000 mg | ORAL | Status: DC | PRN

## 2024-12-14 NOTE — Care Management Important Message (Signed)
 Important Message  Patient Details  Name: Duane Hall MRN: [id number] Date of Birth: [birth year]   Important Message Given:  Yes - Medicare IM     Claretta Deed 12/14/2024, 1:31 PM

## 2024-12-14 NOTE — Progress Notes (Signed)
 [facility] Progress Note  Subjective:  Seen in room Pt refused HD yesterday, his RN says they (he and/or his wife) also declined IV antibiotics today Pt is alert and pleasant , no c/o's   Vitals:   12/13/24 2047 12/14/24 0425 12/14/24 0432 12/14/24 0739  BP: (!) 157/67 (!) 160/68  (!) 153/71  Pulse: 89 92  97  Resp: 17 18 15    Temp: 98.1 F (36.7 C) 98.1 F (36.7 C)    TempSrc: Oral Oral  Oral  SpO2: 93% 92%  92%  Weight:   73.3 kg     Exam: Gen alert, feeding himself No jvd or bruits Chest clear bilat to bases RRR no MRG Abd soft ntnd no mass or ascites +bs Ext mild 1+ LE edema     AVF +bruit    Home bp meds: None     OP HD: [facility] home HD MTTF  3h  76.5kg  Heparin  4000  pulls only 0.3 L to cover rinseback    BP 137/73, HR 114, RR 22-27, temp 101.3   K+ 3.5, bun 23, creat 7.1, alb 3.6, LFT's okay, wbc 8K, Hb 10.8 Flu / covid / rsv neg by pcr  Given IV cefepime , flagyl  and vanc in ED    Assessment/ Plan: ESRD: on HD MTTF, home HD. Had HD here Sat night while unresponsive. Pt alert now and refused HD yesterday. Await results of palliative care meeting this evening.  BP: bp's stable 120- 145/ 65-75.  DNR-limited: per the wife, per their MOST form Volume: some LE edema and +pulm edema by imaging initially. Got 1.75 L off w/ HD Sat evening. Stable on RA.  Anemia of esrd: Hb 10-12 here, follow.   Fever/ lethargy/ fatigue: suspected LLL PNA as seen by CTA chest. Got IV abx and had HD x 1 with some volume removal. MS has improved. Refused IV abx today.     Myer Fret MD  [facility] 12/14/2024, 3:51 PM  Recent Labs  Lab 12/12/24 0301 12/13/24 0347 12/14/24 0457  HGB 9.3* 8.9* 9.4*  ALBUMIN  3.1* 2.9* 3.2*  CALCIUM  8.1* 8.2* 8.1*  PHOS 3.6  --   --   CREATININE 4.63* 6.57* 8.20*  K 3.1* 3.1* 3.0*   No results for input(s): IRON , TIBC, FERRITIN in the last 168 hours. Inpatient medications:  aspirin  EC  81 mg Oral Daily   Chlorhexidine  Gluconate  Cloth  6 each Topical Q0600   feeding supplement  237 mL Oral BID BM   insulin  aspart  0-6 Units Subcutaneous Q4H   mouth rinse  15 mL Mouth Rinse 4 times per day   potassium chloride   20 mEq Oral Once   sodium chloride  flush  3 mL Intravenous Q12H    ceFEPime  (MAXIPIME ) IV Stopped (12/12/24 1200)   metronidazole  500 mg (12/13/24 0548)   mouth rinse

## 2024-12-14 NOTE — Progress Notes (Signed)
 Rounding Note   Patient Name: Duane Hall Date of Encounter: 12/14/2024  [facility] Cardiologist: None   Subjective - No acute events overnight - Patient without complaints today   Scheduled Meds:  aspirin  EC  81 mg Oral Daily   Chlorhexidine  Gluconate Cloth  6 each Topical Q0600   feeding supplement  237 mL Oral BID BM   insulin  aspart  0-6 Units Subcutaneous Q4H   mouth rinse  15 mL Mouth Rinse 4 times per day   potassium chloride   20 mEq Oral Once   sodium chloride  flush  3 mL Intravenous Q12H   Continuous Infusions:  ceFEPime  (MAXIPIME ) IV Stopped (12/12/24 1200)   metronidazole  500 mg (12/13/24 0548)   PRN Meds: mouth rinse   Vital Signs  Vitals:   12/13/24 2047 12/14/24 0425 12/14/24 0432 12/14/24 0739  BP: (!) 157/67 (!) 160/68  (!) 153/71  Pulse: 89 92  97  Resp: 17 18 15    Temp: 98.1 F (36.7 C) 98.1 F (36.7 C)    TempSrc: Oral Oral  Oral  SpO2: 93% 92%  92%  Weight:   73.3 kg     Intake/Output Summary (Last 24 hours) at 12/14/2024 1149 Last data filed at 12/14/2024 0900 Gross per 24 hour  Intake 449.36 ml  Output --  Net 449.36 ml      12/14/2024    4:32 AM 12/12/2024   12:43 AM 11/05/2024   11:31 AM  Last 3 Weights  Weight (lbs) 161 lb 9.6 oz 161 lb 9.6 oz 177 lb  Weight (kg) 73.3 kg 73.3 kg 80.287 kg      Telemetry NSR- Personally Reviewed  ECG  No ECG  Physical Exam  GEN: No acute distress.   Neck: No JVD Cardiac: RRR, no murmur, rubs, or gallops.  Respiratory: Clear to auscultation bilaterally. GI: Soft, nontender, non-distended  MS: No edema; R BKA, left great toe amputated. Neuro:  Nonfocal  Psych: Seems confused, but alert; refused to answer orientation questions  Labs High Sensitivity Troponin:   Recent Labs  Lab 12/11/24 1800 12/12/24 0301  TROPONINIHS 1,507* 2,137*   No results for input(s): TRNPT in the last 720 hours.     Chemistry Recent Labs  Lab 12/12/24 0301 12/13/24 0347  12/14/24 0457  NA 134* 134* 137  K 3.1* 3.1* 3.0*  CL 92* 95* 95*  CO2 28 25 26   GLUCOSE 128* 142* 139*  BUN 17 42* 57*  CREATININE 4.63* 6.57* 8.20*  CALCIUM  8.1* 8.2* 8.1*  MG 1.5* 1.9  --   PROT 5.4* 5.0* 5.4*  ALBUMIN  3.1* 2.9* 3.2*  AST 23 20 23   ALT 9 10 12   ALKPHOS 46 40 44  BILITOT 1.2 1.1 1.6*  GFRNONAA 13* 9* 7*  ANIONGAP 14 14 16*    Lipids No results for input(s): CHOL, TRIG, HDL, LABVLDL, LDLCALC, CHOLHDL in the last 168 hours.  Hematology Recent Labs  Lab 12/12/24 0301 12/13/24 0347 12/14/24 0457  WBC 4.8 4.2 3.6*  RBC 3.10* 2.88* 3.13*  HGB 9.3* 8.9* 9.4*  HCT 27.8* 26.1* 27.6*  MCV 89.7 90.6 88.2  MCH 30.0 30.9 30.0  MCHC 33.5 34.1 34.1  RDW 13.3 13.1 13.2  PLT 44* 54* 58*   Thyroid  No results for input(s): TSH, FREET4 in the last 168 hours.  BNPNo results for input(s): BNP, PROBNP in the last 168 hours.  DDimer No results for input(s): DDIMER in the last 168 hours.   Radiology  ECHOCARDIOGRAM COMPLETE Result Date: 12/13/2024  ECHOCARDIOGRAM REPORT   Patient Name:   Duane Hall Date of Exam: 12/13/2024 Medical Rec #:  [id number]     Height:       69.0 in Accession #:    [id number]    Weight:       161.6 lb Date of Birth:  07-29-1956     BSA:          1.887 m Patient Age:    68 years      BP:           130/78 mmHg Patient Gender: M             HR:           86 bpm. Exam Location:  Inpatient Procedure: 2D Echo, Color Doppler and Cardiac Doppler (Both Spectral and Color            Flow Doppler were utilized during procedure). Indications:     NSTEMI  History:         Patient has prior history of Echocardiogram examinations, most                  recent 07/29/2020. Acute MI; Risk Factors:Hypertension and                  Dyslipidemia.  Sonographer:     Sherlean Dubin Referring Phys:  CALLIE E GOODRICH Diagnosing Phys: Joelle Cedars Tonleu IMPRESSIONS  1. Left ventricular ejection fraction, by estimation, is 40 to 45%. Left ventricular  ejection fraction by 2D MOD biplane is 40.6 %. The left ventricle has mildly decreased function. The left ventricle demonstrates regional wall motion abnormalities (see  scoring diagram/findings for description). Left ventricular diastolic parameters are consistent with Grade I diastolic dysfunction (impaired relaxation).  2. Right ventricular systolic function is normal. The right ventricular size is normal.  3. The mitral valve is normal in structure. No evidence of mitral valve regurgitation. No evidence of mitral stenosis.  4. The aortic valve is normal in structure. Aortic valve regurgitation is not visualized. No aortic stenosis is present.  5. The inferior vena cava is normal in size with <50% respiratory variability, suggesting right atrial pressure of 8 mmHg. Comparison(s): Changes from prior study are noted. EF is lower and there are new wall motion abnormalities. FINDINGS  Left Ventricle: Left ventricular ejection fraction, by estimation, is 40 to 45%. Left ventricular ejection fraction by 2D MOD biplane is 40.6 %. The left ventricle has mildly decreased function. The left ventricle demonstrates regional wall motion abnormalities. The left ventricular internal cavity size was normal in size. There is no concentric left ventricular hypertrophy. Left ventricular diastolic parameters are consistent with Grade I diastolic dysfunction (impaired relaxation).  LV Wall Scoring: The entire septum and entire inferior wall are hypokinetic. The entire anterior wall and entire lateral wall are normal. Right Ventricle: The right ventricular size is normal. No increase in right ventricular wall thickness. Right ventricular systolic function is normal. Left Atrium: Left atrial size was normal in size. Right Atrium: Right atrial size was normal in size. Pericardium: There is no evidence of pericardial effusion. Mitral Valve: The mitral valve is normal in structure. No evidence of mitral valve regurgitation. No evidence of  mitral valve stenosis. MV peak gradient, 8.2 mmHg. The mean mitral valve gradient is 3.0 mmHg. Tricuspid Valve: The tricuspid valve is normal in structure. Tricuspid valve regurgitation is not demonstrated. No evidence of tricuspid stenosis. Aortic Valve: The aortic valve is normal in structure. Aortic valve regurgitation  is not visualized. No aortic stenosis is present. Aortic valve mean gradient measures 3.0 mmHg. Aortic valve peak gradient measures 5.5 mmHg. Aortic valve area, by VTI measures 2.68 cm. Pulmonic Valve: The pulmonic valve was normal in structure. Pulmonic valve regurgitation is not visualized. No evidence of pulmonic stenosis. Aorta: The aortic root and ascending aorta are structurally normal, with no evidence of dilitation. Venous: The inferior vena cava is normal in size with less than 50% respiratory variability, suggesting right atrial pressure of 8 mmHg. IAS/Shunts: No atrial level shunt detected by color flow Doppler.  LEFT VENTRICLE PLAX 2D                        Biplane EF (MOD) LVIDd:         5.60 cm         LV Biplane EF:   Left LVIDs:         4.20 cm                          ventricular LV PW:         1.10 cm                          ejection LV IVS:        1.20 cm                          fraction by LVOT diam:     2.00 cm                          2D MOD LV SV:         66                               biplane is LV SV Index:   35                               40.6 %. LVOT Area:     3.14 cm                                Diastology                                LV e' medial:    5.44 cm/s LV Volumes (MOD)               LV E/e' medial:  23.2 LV vol d, MOD    144.1 ml      LV e' lateral:   6.42 cm/s A2C:                           LV E/e' lateral: 19.6 LV vol d, MOD    141.2 ml A4C: LV vol s, MOD    92.6 ml A2C: LV vol s, MOD    83.8 ml A4C: LV SV MOD A2C:   51.4 ml LV SV MOD A4C:   141.2 ml LV SV MOD BP:    60.5 ml RIGHT VENTRICLE  IVC RV Basal diam:  3.70 cm     IVC diam: 1.80  cm RV Mid diam:    2.80 cm RV S prime:     11.60 cm/s  PULMONARY VEINS TAPSE (M-mode): 1.9 cm      Diastolic Velocity: 39.50 cm/s                             S/D Velocity:       1.50                             Systolic Velocity:  59.90 cm/s LEFT ATRIUM             Index        RIGHT ATRIUM           Index LA diam:        3.90 cm 2.07 cm/m   RA Area:     15.40 cm LA Vol (A2C):   47.1 ml 24.96 ml/m  RA Volume:   40.20 ml  21.30 ml/m LA Vol (A4C):   42.2 ml 22.36 ml/m LA Biplane Vol: 47.3 ml 25.07 ml/m  AORTIC VALVE AV Area (Vmax):    2.69 cm AV Area (Vmean):   2.67 cm AV Area (VTI):     2.68 cm AV Vmax:           117.00 cm/s AV Vmean:          80.750 cm/s AV VTI:            0.246 m AV Peak Grad:      5.5 mmHg AV Mean Grad:      3.0 mmHg LVOT Vmax:         100.20 cm/s LVOT Vmean:        68.650 cm/s LVOT VTI:          0.211 m LVOT/AV VTI ratio: 0.85  AORTA Ao Root diam: 2.90 cm Ao Asc diam:  2.80 cm MITRAL VALVE                TRICUSPID VALVE MV Area (PHT): 5.54 cm     TR Peak grad:   13.7 mmHg MV Area VTI:   2.06 cm     TR Vmax:        185.00 cm/s MV Peak grad:  8.2 mmHg MV Mean grad:  3.0 mmHg     SHUNTS MV Vmax:       1.43 m/s     Systemic VTI:  0.21 m MV Vmean:      78.6 cm/s    Systemic Diam: 2.00 cm MV Decel Time: 137 msec MV E velocity: 126.00 cm/s MV A velocity: 106.00 cm/s MV E/A ratio:  1.19 Franck Azobou Tonleu Electronically signed by Joelle Cedars Tonleu Signature Date/Time: 12/13/2024/12:14:38 PM    Final (Updated)    MR BRAIN WO CONTRAST Result Date: 12/12/2024 EXAM: MRI BRAIN WITHOUT CONTRAST 12/12/2024 01:45:26 PM TECHNIQUE: Multiplanar multisequence MRI of the head/brain was performed without the administration of intravenous contrast. The patient discontinued the examination prior to completion. Axial and coronal DWI, sagittal T1, axial T2, and axial FLAIR sequences were obtained and are motion degraded, including severe motion on the FLAIR sequence despite repeat imaging. COMPARISON:  Head CT 12/11/2024. CLINICAL HISTORY: Mental status change, unknown cause. FINDINGS: BRAIN AND VENTRICLES: Diffusion weighted imaging is of acceptable quality, and no acute infarct is evident. No  significant intracranial mass effect/midline shift, hydrocephalus, or extra-axial fluid collection is identified. Patchy T2 hyperintensities in the cerebral white matter bilaterally are advanced for age and nonspecific but compatible with chronic small vessel ischemic disease. There are chronic lacunar infarcts in the deep cerebral white matter, deep gray nuclei, and cerebellum. There is mild cerebral atrophy. Major intracranial vascular flow voids are preserved. ORBITS: Bilateral cataract extraction. SINUSES AND MASTOIDS: Small fluid level in the right sphenoid sinus. Trace right mastoid fluid. BONES AND SOFT TISSUES: Normal marrow signal. No acute soft tissue abnormality. IMPRESSION: 1. Motion degraded, incomplete examination. 2. No acute infarct. 3. Age-advanced chronic small vessel ischemic disease. Electronically signed by: Dasie Hamburg MD 12/12/2024 02:41 PM EST RP Workstation: [id number]    Cardiac Studies   TTE 12/13/24:  IMPRESSIONS     1. Left ventricular ejection fraction, by estimation, is 40 to 45%. Left  ventricular ejection fraction by 2D MOD biplane is 40.6 %. The left  ventricle has mildly decreased function. The left ventricle demonstrates  regional wall motion abnormalities (see   scoring diagram/findings for description). Left ventricular diastolic  parameters are consistent with Grade I diastolic dysfunction (impaired  relaxation).   2. Right ventricular systolic function is normal. The right ventricular  size is normal.   3. The mitral valve is normal in structure. No evidence of mitral valve  regurgitation. No evidence of mitral stenosis.   4. The aortic valve is normal in structure. Aortic valve regurgitation is  not visualized. No aortic stenosis is present.   5. The inferior  vena cava is normal in size with <50% respiratory  variability, suggesting right atrial pressure of 8 mmHg.   Patient Profile   Mr. Wilhoite is a 68 y.o. male with a PMH of HTN, PAD s/p amputations, ESRD on iHD TThS, DM2, osteomyelitis, hep C and anemia of CKD who was admitted for AMS 2/2 PNA and found to have an NSTEMI.  Cardiology is consulted for the evaluation of NSTEMI.  Assessment & Plan   #NSTEMI, Type 1 vs Type 2 - Patient presented with AMS in the setting of pneumonia and found to have a markedly elevated troponin >2000. - TTE on 12/15 demonstrates a newly mildly reduced LVEF with wall motion abnormalities. - The patient has no cardiopulmonary symptoms; however, I suspect that he had a true likely type I MI. - Unfortunately the patient has significant thrombocytopenia which prohibits left heart catheterization and aggressive anticoagulation at this time. - Furthermore, he appears confused so discussing the nuances and risk associated with anticoagulation is challenging at this time.  Plus the patient is DNAR. -I discussed the idea of initiating DAPT therapy for 12 months with the patient's hospitalist Dr. Jonel.  Apparently there are ongoing GOC discussions with the patient and his wife and the patient may be moving more towards palliation rather than escalating medical therapy.  With this in mind I will continue his current treatment course and await the conclusion of these ongoing discussions before deciding on adding Plavix . Continue aspirin  81 mg daily Holding off adding Plavix  given ongoing GOC discussions regarding the desire for escalating medical therapy Not a great candidate for LHC given thrombocytopenia and DNAR status.  Also no current cardiopulmonary symptoms.  #New HFmrEF - TTE on 12/15 demonstrates a mildly reduced EF 40 to 45%. - This likely represents an ischemic cardiomyopathy in the setting of ACS. - He appears well compensated from a heart failure standpoint and is  asymptomatic. - I started some GDMT yesterday,  but these medications were discontinued at the request of family not wanting escalating medical therapy at this time. Carvedilol  and losartan  were discontinued at the preference of family Spironolactone and SGLT2 inhibitors are contraindicated in the setting of ESRD  #HTN - BP is stable  #PAD - On Plavix  as an outpatient, but currently treating ACS with aspirin . - Restart statin once safe from a p.o. perspective  #Sepsis 2/2 PNA - Treatment per primary      For questions or updates, please contact [facility] Please consult [email or web address] for contact info under       Signed, Georganna Archer, MD  12/14/2024, 11:49 AM

## 2024-12-14 NOTE — Care Management Important Message (Signed)
 Important Message  Patient Details  Name: Gian Ybarra MRN: [id number] Date of Birth: [birth year]   Important Message Given:  Yes - Medicare IM     Claretta Deed 12/14/2024, 3:10 PM

## 2024-12-14 NOTE — Progress Notes (Signed)
°  Progress Note   Patient: Duane Hall FMW:[id number] DOB: May 01, 1956 DOA: 12/11/2024     3 DOS: the patient was seen and examined on 12/14/2024 at 10:45AM      Brief hospital course: 68 y.o. M with ESRD on HD TThS, HTN, DM, hx osteomyelitis of great toe, hep C and PVD s/p right BKA who presented with malaise, found to have pneumonia sepsis.     Assessment and Plan: Pneumonia sepsis, unknown organism See summary from 12/15 -continue antibiotics as patient will allow     Acute metabolic encephalopathy Patient remains encephalopathic, see summary from 12/15     NSTEMI Discussed with cardiology, given family expressed wishes, patient's expressed wishes, they defer catheterization or any invasive measures.  Given their expressed wishes to allow a natural progression, we will defer initiating further treatment for NSTEMI at this time. - Supportive care   ESRD Refusing HD - Consult Palliative Care   Diabetes Glucose normal - Continue sliding scale corrections as patient will allow   Peripheral vascular disease Hypertension Blood pressure now has normalized - Continue aspirin , Crestor  - Resumption of Plavix  is pending family meeting this afternoon   Anemia of chronic kidney disease Hemoglobin unchanged, no clinical clinical bleeding   Thrombocytopenia Due to sepsis, trending up - Trend CBC   Failure to thrive - Consult palliative care   Hypokalemia Hypomagnesemia Supplemented             Subjective: Patient has no new complaints, he required restraints overnight due to agitation      Physical Exam: BP (!) 153/71 (BP Location: Left Arm)   Pulse 97   Temp 98.1 F (36.7 C) (Oral)   Resp 15   Wt 73.3 kg   SpO2 92%   BMI 23.86 kg/m   Frail elderly man, lying in bed, interactive and appropriate but tired RRR, no murmurs, no peripheral edema Respiratory rate normal, lungs clear without rales or wheezes Oriented to self, psychomotor slowing noted,  seems distracted and disoriented, face symmetric, upper extremity strength weak but symmetric    Data Reviewed: Discussed with cardiology Basic metabolic panel and CBC reviewed    Family Communication: wife sons    Disposition: Status is: Inpatient         Author: [name], MD 12/14/2024 3:01 PM  For on call review [email or web address].

## 2024-12-14 NOTE — Plan of Care (Signed)
   Problem: Activity: Goal: Risk for activity intolerance will decrease Outcome: Progressing   Problem: Nutrition: Goal: Adequate nutrition will be maintained Outcome: Progressing   Problem: Coping: Goal: Level of anxiety will decrease Outcome: Progressing   Problem: Safety: Goal: Ability to remain free from injury will improve Outcome: Progressing

## 2024-12-14 NOTE — IPAL (Signed)
°  Interdisciplinary Goals of Care Family Meeting   Date carried out: 12/14/2024  Location of the meeting: Unit  Member's involved: Physician, Family Member or next of kin, and Palliative care team member  Durable Power of Attorney or acting medical decision maker: Wife    Discussion: We discussed goals of care for [employer] .  Reviewed life path, recent trajectory and functional decline and cognitive decline.  In last two months, patient unable to drive, memory worsening, starting to vocalize wanting to stop dialysis.    During this admission, diagnosed with pneumonia and myocardial infarction.  Extensively discussed goals of care the last two days and today and family believe he would have wished to transition to comfort care alone.  Stop antibiotics Full comfort measures   Code status:   Code Status: Do not attempt resuscitation (DNR) - Comfort care   Disposition: In-patient comfort care  Time spent for the meeting: 37 minutes    [name], MD  12/14/2024, 5:35 PM

## 2024-12-14 NOTE — Plan of Care (Signed)

## 2024-12-14 NOTE — Consult Note (Signed)
 Consultation Note Date: 12/14/2024   Patient Name: Duane Hall  DOB: May 08, 1956  MRN: [id number]  Age / Sex: 68 y.o., male  PCP: Macarthur Elouise SQUIBB, DO Referring Physician: Jonel Lonni SQUIBB, *  Reason for Consultation: Establishing goals of care  HPI/Patient Profile: 68 y.o. male   admitted on 12/11/2024 with past medical history  of  hypertension, diabetes, hepatitis C, osteomyelitis of the left great toe status post amputation coming for AMS.   Admitted through the emergency after a reported fall from patient's wife.  Family report continued physical functional and cognitive decline over the past many months.  Admitted for treatment stabilization  Patient and family face treatment option decision, advanced directive decisions and anticipatory care needs.    Clinical Assessment and Goals of Care:   This NP Ronal Plants reviewed medical records, received report from team, assessed the patient and then meet at the patient's bedside along with his wife/[name] and his 2 sons [name] and [name] to discuss diagnosis, prognosis, GOC, EOL wishes disposition and options.   Concept of Palliative Care was introduced as specialized medical care for people and their families living with serious illness.  If focuses on providing relief from the symptoms and stress of a serious illness.  The goal is to improve quality of life for both the patient and the family.Values and goals of care important to patient and family were attempted to be elicited.  Created space and opportunity for  family to explore thoughts and feelings regarding current medical situation.  All family present are in agreement that shift to a full comfort path, foregoing life-prolonging measures, allowing for natural death is in the patient's best interest and in alignment with his stated wishes.  Patient has told his family that he is done and  does not desire any further dialysis treatments or  other life-prolonging measures     A  discussion was had today regarding advanced directives.  Concepts specific to code status, artifical feeding and hydration, continued IV antibiotics and rehospitalization was had.     The difference between a aggressive medical intervention path  and a palliative comfort care path for this patient at this time was had.     Education offered on details of comfort path; DNR/DNI,  no artificial feeding or hydration now or in the future, no further testing, no IV antibiotic use, medication to treat symptom management to enhance comfort.  Education offered on the utilization of specific medications; Dilaudid /Ativan  risks and side effects   Education offered on hospice benefit; philosophy and eligibility.  Discussed inpatient hospice eligibility and referral process.  Family verbalized interest in [facility] inpatient hospice/[facility], will place referral for [facility]   Natural trajectory and expectations at EOL were discussed, specific to end-of-life within the context of end-stage renal disease,  questions and concerns addressed.  Family encouraged to call with questions or concerns.       PMT will continue to support holistically.         No documented H POA or advance  care planning documents.  Patient's wife is main management consultant along with the support of her 3 sons.       SUMMARY OF RECOMMENDATIONS    Code Status/Advance Care Planning: DNR   Symptom Management:  Pain dyspnea: Dilaudid  0.5 to 2 mg IV every 30 minutes as needed Agitation: Ativan  1 mg IV every 4 hours as needed  Palliative Prophylaxis:  Delirium Protocol, Frequent Pain Assessment, and Oral Care  Additional Recommendations (Limitations, Scope, Preferences): Full Comfort Care  Psycho-social/Spiritual:  Desire for further Chaplaincy support:no Additional Recommendations: Education on Hospice  Prognosis:  < 2  weeks  Discharge Planning: Hospice facility      Primary Diagnoses: Present on Admission:  AMS (altered mental status)  PAD (peripheral artery disease)   I have reviewed the medical record, interviewed the patient and family, and examined the patient. The following aspects are pertinent.  Past Medical History:  Diagnosis Date   Asthma    Cancer (HCC)    Cataract    CKD (chronic kidney disease), stage III (HCC)    Dialysis [facility] at [facility]   Diabetes mellitus    Glaucoma    Hepatitis    Hep C   Hypertension    no longer   Osteomyelitis of great toe of left foot [facility])    Vascular insufficiency 05/2020   Social History   Socioeconomic History   Marital status: Married    Spouse name: Not on file   Number of children: Not on file   Years of education: Not on file   Highest education level: Not on file  Occupational History   Occupation: Art Gallery Manager  Tobacco Use   Smoking status: Every Day    Current packs/day: 0.30    Types: Cigarettes   Smokeless tobacco: Never   Tobacco comments:    Used to [employer] Use   Vaping status: Some Days   Substances: Nicotine   Substance and Sexual Activity   Alcohol  use: No    Alcohol /week: 0.0 standard drinks of alcohol    Drug use: No   Sexual activity: Not Currently  Other Topics Concern   Not on file  Social History Narrative   Married.    Social Drivers of Health   Tobacco Use: High Risk (11/05/2024)   Patient History    Smoking Tobacco Use: Every Day    Smokeless Tobacco Use: Never    Passive Exposure: Not on file  Financial Resource Strain: Unknown (02/20/2022)   Received from [facility]   Overall Financial Resource Strain (CARDIA)  Food Insecurity: Low Risk (08/10/2024)   Received from [facility]   Epic    Within the past 12 months, the food you bought just didn't last and you didn't have money to get more. : Never true    Within the past 12 months, you worried that your food would run out  before you got money to buy more: Never true  Transportation Needs: No Transportation Needs (08/10/2024)   Received from [facility]    In the past 12 months, has lack of reliable transportation kept you from medical appointments, meetings, work or from getting things needed for daily living? : No  Physical Activity: Not on file  Stress: Not on file  Social Connections: Not on file  Depression (PHQ2-9): Low Risk (04/04/2023)   Depression (PHQ2-9)    PHQ-2 Score: 0  Alcohol  Screen: Not on file  Housing: Low Risk (08/10/2024)   Received from [facility]  Epic    What is your living situation today?: I have a steady place to live    Think about the place you live. Do you have problems with any of the following? Choose all that apply:: None/None on this list  Utilities: Low Risk (08/10/2024)   Received from [facility]   Utilities    In the past 12 months has the electric, gas, oil, or water company threatened to shut off services in your home? : No  Health Literacy: Not on file   Family History  Problem Relation Age of Onset   Cancer Father    Diabetes Mother    Scheduled Meds:  aspirin  EC  81 mg Oral Daily   Chlorhexidine  Gluconate Cloth  6 each Topical Q0600   feeding supplement  237 mL Oral BID BM   insulin  aspart  0-6 Units Subcutaneous Q4H   mouth rinse  15 mL Mouth Rinse 4 times per day   potassium chloride   20 mEq Oral Once   sodium chloride  flush  3 mL Intravenous Q12H   Continuous Infusions:  ceFEPime  (MAXIPIME ) IV Stopped (12/12/24 1200)   metronidazole  500 mg (12/13/24 0548)   PRN Meds:.mouth rinse Medications Prior to Admission:  Prior to Admission medications  Medication Sig Start Date End Date Taking? Authorizing Provider  aspirin  EC 81 MG EC tablet Take 1 tablet (81 mg total) by mouth daily. Swallow whole. 06/21/20  Yes Odell Celinda Balo, MD  clopidogrel  (PLAVIX ) 75 MG tablet Take 1 tablet (75 mg total) by mouth daily with breakfast.  06/21/20  Yes Odell Celinda Balo, MD  gentamicin  ointment (GARAMYCIN ) 0.1 % Apply 1 Application topically daily. Apply to wound daily 11/09/24  Yes McDonald, Juliene SAUNDERS, DPM  Insulin  Disposable Pump (OMNIPOD 5 G7 PODS, GEN 5,) MISC 1 Device by Does not apply route every other day. Patient taking differently: 1 Device by Does not apply route continuous. 11/05/24  Yes Shamleffer, Ibtehal Jaralla, MD  insulin  lispro (HUMALOG ) 100 UNIT/ML injection FOR USE IN PUMP, TOTAL OF 50 UNITS DAILY 11/05/24  Yes Shamleffer, Ibtehal Jaralla, MD  Methoxy PEG-Epoetin  Beta (MIRCERA IJ) Inject into the skin. 07/28/23  Yes [provider]  pantoprazole  (PROTONIX ) 40 MG tablet Take 1 tablet (40 mg total) by mouth daily. 03/17/21  Yes Pokhrel, Laxman, MD  rosuvastatin  (CRESTOR ) 10 MG tablet Take 1 tablet (10 mg total) by mouth daily. 11/05/24  Yes Shamleffer, Donell Cardinal, MD  Continuous Glucose Sensor (DEXCOM G7 SENSOR) MISC 1 Device by Other route as directed. 1 sensor every 10 days 11/05/24   Shamleffer, Ibtehal Jaralla, MD  mupirocin  ointment (BACTROBAN ) 2 % Apply 1 Application topically daily. Recommend 30 g tube for 30 day supply of mupirocin  to apply to wound on outside of foot and on blister once daily. Cover with gauze. Patient not taking: Reported on 12/11/2024 09/22/24   Magdalen Prentice PARAS, DPM   Allergies[1] Review of Systems  Unable to perform ROS: Mental status change    Physical Exam Constitutional:      Appearance: He is cachectic. He is ill-appearing.  Cardiovascular:     Rate and Rhythm: Normal rate.  Pulmonary:     Effort: Pulmonary effort is normal.  Musculoskeletal:     Comments: Generalized weakness and muscle atrophy  Skin:    General: Skin is warm and dry.  Psychiatric:        Behavior: Behavior is uncooperative.     Vital Signs: BP (!) 153/71 (BP Location: Left Arm)   Pulse  97   Temp 98.1 F (36.7 C) (Oral)   Resp 15   Wt 73.3 kg   SpO2 92%   BMI 23.86 kg/m  Pain Scale:  0-10 POSS *See Group Information*: 1-Acceptable,Awake and alert Pain Score: 0-No pain   SpO2: SpO2: 92 % O2 Device:SpO2: 92 % O2 Flow Rate: .O2 Flow Rate (L/min): 2 L/min  IO: Intake/output summary:  Intake/Output Summary (Last 24 hours) at 12/14/2024 1109 Last data filed at 12/14/2024 0900 Gross per 24 hour  Intake 449.36 ml  Output --  Net 449.36 ml    LBM: Last BM Date :  (pt unable to tell me) Baseline Weight: Weight: 73.3 kg Most recent weight: Weight: 73.3 kg     Palliative Assessment/Data: 40 % at best      Time:   75 minutes  Discussed with Dr. Jonel and bedside RN detailed plan of care, convert to full comfort, transition to inpatient hospice if eligible symptom management per [facility]. Signed by: Ronal Plants, NP   Please contact Palliative Medicine Team phone at [phone] for questions and concerns.  For individual provider: See Amion                 [1]  Allergies Allergen Reactions   Amoxicillin -Pot Clavulanate Diarrhea   Midodrine  Other (See Comments)    Other reaction(s): Urinary Sensation   Tape Other (See Comments)    Latex band aids cause blistering

## 2024-12-15 DIAGNOSIS — G9341 Metabolic encephalopathy: Secondary | ICD-10-CM

## 2024-12-15 DIAGNOSIS — L8990 Pressure ulcer of unspecified site, unspecified stage: Secondary | ICD-10-CM

## 2024-12-15 DIAGNOSIS — R627 Adult failure to thrive: Secondary | ICD-10-CM | POA: Insufficient documentation

## 2024-12-15 DIAGNOSIS — Z9181 History of falling: Secondary | ICD-10-CM

## 2024-12-15 DIAGNOSIS — I120 Hypertensive chronic kidney disease with stage 5 chronic kidney disease or end stage renal disease: Secondary | ICD-10-CM

## 2024-12-15 DIAGNOSIS — E1122 Type 2 diabetes mellitus with diabetic chronic kidney disease: Secondary | ICD-10-CM

## 2024-12-15 LAB — GLUCOSE, CAPILLARY: Glucose-Capillary: 141 mg/dL — ABNORMAL HIGH (ref 70–99)

## 2024-12-15 NOTE — Progress Notes (Signed)
 The patient was transition to comfort care.  No additional cardiovascular interventions at this time.  Cardiology will sign off.   Zayda Angell T. Floretta HEATH, MD [place]  [facility]  12/15/2024 3:09 PM

## 2024-12-15 NOTE — Progress Notes (Signed)
 Patient ID: Duane Hall, male   DOB: [birth year], 68 y.o.   MRN: [id number]    Progress Note from the Palliative Medicine Team at [facility]   Patient Name: Duane Hall        Date: 12/15/2024 DOB: 05/11/1956  Age: 68 y.o. MRN#: [id number] Attending Physician: Jonel Lonni SQUIBB, * Primary Care Physician: Lazoff, Shawn P, DO Admit Date: 12/11/2024   Reason for Consultation/Follow-up   Establishing Goals of Care   HPI/ Brief Hospital Review   68 y.o. male   admitted on 12/11/2024 with past medical history  of  hypertension, diabetes, hepatitis C, osteomyelitis of the left great toe status post amputation coming for AMS.    Admitted through the emergency after a reported fall from patient's wife.  Family report continued physical functional and cognitive decline over the past many months.   Admitted for treatment stabilization.    Initial PMT family meeting 12-14-24.  Decision made at that time to shift to full comfort, focus is comfort and dignity allowing for a natural death.    Patient and family face treatment option decision, advanced directive decisions and anticipatory care needs.     Subjective  Extensive chart review has been completed prior to visiting  patient  including  vital signs,  progress/consult notes, orders, medications and efficacy.      This NP visited  patient at the bedside as a follow up for palliative medicine needs and emotional support.  No family at bedside.    Mr Blakely appears comfortable, I did not attempt to wake him.  Spoke to wife by telephone. Family is comfortable with decision for comfort.  Ongoing education regarding natural trajectory and expectations at EOL.   Wife is hopeful for quality time for her son who is coming in from out of town.    Detailed education regarding the likelihood of patient becoming more and more unresponsive as he approaches EOL within the context of ESRD and no dialysis.     Education offered  regarding the utilization of low dose opioids to treat dyspnea and pain.  Family is in discussion with hospice liaison for IP hospice unit for EOL care.    Plan of Care: -DNR/DNI -no artifical feeding or hydration now or in the future  -no scan, labs, dialysis -symptom management  -hopeful for IP hospice for EOL care -prognosis is days to a weeks  -PMT will continue to support holistically  Education offered today regarding  the importance of continued conversation with family and their  medical providers regarding overall plan of care and treatment options,  ensuring decisions are within the context of the patients values and GOCs.  Questions and concerns addressed   Discussed with primary team/Dr Danford and nursing staff   Time: 50  minutes  Detailed review of medical records ( labs, imaging, vital signs), medically appropriate exam ( MS, skin, cardiac,  resp)   discussed with treatment team, counseling and education to patient, family, staff, documenting clinical information, medication management, coordination of care    Ronal Plants NP  Palliative Medicine Team Team Phone # [phone] Pager [phone]

## 2024-12-15 NOTE — Progress Notes (Signed)
 [id number] [facility] Liaison Note  Received request from Care Manager for family interest in [facility].  Eligibility is confirmed.  Spoke with [name], wife, to confirm interest and explain services. [name] is visiting [employer] and will decide between [facility] or bringing him home. Care manager aware.    Liaison will provide update once a decision has been made.  Thank you. Inocente Jacobs RN BSN [facility] Liaison [phone]

## 2024-12-15 NOTE — Progress Notes (Signed)
°  Progress Note   Patient: Duane Hall FMW:[id number] DOB: September 09, 1956 DOA: 12/11/2024     4 DOS: the patient was seen and examined on 12/15/2024 at 11:10AM      Brief hospital course: 68 y.o. M with ESRD on HD TThS, HTN, DM, hx osteomyelitis of great toe, hep C and PVD s/p right BKA who presented with malaise, found to have pneumonia sepsis.     Assessment and Plan: Principal Problem:   Acute metabolic encephalopathy Active Problems:   Anemia of chronic disease   Hypokalemia   Aortic atherosclerosis   Gastroparesis diabeticorum (HCC)   Other emphysema (HCC)   Sepsis due to pneumonia   PAD (peripheral artery disease)   S/P BKA (below knee amputation) unilateral, right (HCC)   Thrombocytopenia   Type 2 diabetes mellitus with chronic kidney disease on chronic dialysis, without long-term current use of insulin  (HCC)   Dyslipidemia   NSTEMI (non-ST elevated myocardial infarction) (HCC)   Pressure injury of skin coccyx and left heel, stage I present on admission   Failure to thrive in adult   Hypomagnesemia   New chronic systolic congestive heart failure    Patient was admitted with confusion and found to have pneumonia sepsis and NSTEMI.  Nephrology and Cardiology were consulted, but patient refused all interventions and family reinforced that his wishes had been clear leading up to admission.  Palliative care were consulted and elicited his recent worsening clinical trajectory, functional decline and cognitive decline. In last two months, patient unable to drive, memory worsening, starting to vocalize wanting to stop dialysis.   Here, family elected to honor his wishes to stop dialysis and pursue comfort measures only.  - Comfort measures only - Has required 4 doses IV lorazepam  and 1 dose IV Haldol  in the last 24 hours to ease anxiety and agitation      Subjective: Remains delirious.  No fever, no respiratory symptoms, no pain complaints.  Agitated and  uncomfortable.     Physical Exam: BP (!) 107/48 (BP Location: Left Arm)   Pulse 80   Temp 98.1 F (36.7 C) (Oral)   Resp 17   Wt 73.3 kg   SpO2 93%   BMI 23.86 kg/m   Chronically ill-appearing adult male, lying in bed, appears restless and uncomfortable Tachycardic, regular, no murmurs, no peripheral edema Respiratory rate normal, lungs clear without rales or wheezes Abdomen soft, no tenderness palpation He makes eye contact and responds to questions, but his answers intermittently does not make sense, and he is unable to engage in rational conversation about the situation, he is irritable and restless       Disposition: Status is: Inpatient         Author: [name], MD 12/15/2024 3:43 PM  For on call review [email or web address].

## 2024-12-15 NOTE — Plan of Care (Signed)
  Problem: Skin Integrity: Goal: Risk for impaired skin integrity will decrease Outcome: Progressing   Problem: Coping: Goal: Level of anxiety will decrease Outcome: Progressing   Problem: Pain Managment: Goal: General experience of comfort will improve and/or be controlled Outcome: Progressing

## 2024-12-15 NOTE — Progress Notes (Signed)
 Transitioning to comfort care. No further dialysis. Will sign off.   Myer Fret  MD  [facility] 12/15/2024, 1:30 PM

## 2024-12-15 NOTE — TOC Progression Note (Addendum)
 Transition of Care [facility]) - Progression Note    Patient Details  Name: Duane Hall MRN: [id number] Date of Birth: [birth year]  Transition of [facility]) CM/SW Contact  Lendia Dais, [place] Phone Number: 12/15/2024, 11:18 AM  Clinical Narrative:  TOC received consult for residential hospice. CSW spoke to pt's spouse [name] who stated interest in [facility]. CSW sent a secure chat to hospice representative.  1610 - Hospice representative informed CSW that the pt has been approved for [facility] and that the pt's spouse is deciding on [employer] vs home.  CSW will continue to monitor.                      Expected Discharge Plan and Services                                               Social Drivers of Health (SDOH) Interventions SDOH Screenings   Food Insecurity: Low Risk (08/10/2024)   Received from [facility]  Housing: Low Risk (08/10/2024)   Received from [facility]  Transportation Needs: No Transportation Needs (08/10/2024)   Received from [facility]  Utilities: Low Risk (08/10/2024)   Received from [facility]  Depression (PHQ2-9): Low Risk (04/04/2023)  Financial Resource Strain: Unknown (02/20/2022)   Received from [facility]  Tobacco Use: High Risk (11/05/2024)    Readmission Risk Interventions     No data to display

## 2024-12-16 DIAGNOSIS — E119 Type 2 diabetes mellitus without complications: Secondary | ICD-10-CM

## 2024-12-16 DIAGNOSIS — R4182 Altered mental status, unspecified: Secondary | ICD-10-CM | POA: Diagnosis not present

## 2024-12-16 DIAGNOSIS — B1920 Unspecified viral hepatitis C without hepatic coma: Secondary | ICD-10-CM

## 2024-12-16 DIAGNOSIS — R627 Adult failure to thrive: Secondary | ICD-10-CM

## 2024-12-16 LAB — CULTURE, BLOOD (ROUTINE X 2): Culture: NO GROWTH

## 2024-12-16 NOTE — Progress Notes (Signed)
 Patient ID: Duane Hall, male   DOB: 14-Oct-1956, 68 y.o.   MRN: [id number]    Progress Note from the Palliative Medicine Team at [facility]   Patient Name: Duane Hall        Date: 12/16/2024 DOB: 1956/11/03  Age: 68 y.o. MRN#: [id number] Attending Physician: Duane Hall, * Primary Care Physician: Lazoff, Shawn P, DO Admit Date: 12/11/2024   Reason for Consultation/Follow-up   Establishing Goals of Care   HPI/ Brief Hospital Review   68 y.o. male   admitted on 12/11/2024 with past medical history  of  hypertension, diabetes, hepatitis C, osteomyelitis of the left great toe status post amputation coming for AMS.    Admitted through the emergency after a reported fall from patient's wife.  Family report continued physical functional and cognitive decline over the past many months.   Admitted for treatment stabilization.    Initial PMT family meeting 12-14-24.  Decision made at that time to shift to full comfort, focus is comfort and dignity allowing for a natural death.    Patient and family face treatment option decision, advanced directive decisions and anticipatory care needs.     Subjective  Extensive chart review has been completed prior to visiting  patient  including  vital signs,  progress/consult notes, orders, medications and efficacy.      This NP visited  patient at the bedside as a follow up for palliative medicine needs and emotional support.  No family at bedside.    Patient appears comfortable - tells me he wants to get up, is confused. Denies pain.   Discussed with Dr. Jonel and hospice team - family has declined hospice facility.   Returned to bedside to follow up with family - son at bedside. Reviewed with son expected trajectory - likely to become more and more sleepy until not waking up as he approaches end of life. He expresses understanding. Has no concerns. Offered to return when patient's wife arrived however son texted wife - she  reports no questions or concerns for palliative team. Son explains wife has been overwhelmed with information over the past few days. PMT will allow space.   Discussed again with Dr. Jonel. PMT will not be involved again until Sunday per attending's preference.     Plan of Care: -DNR/DNI -no artifical feeding or hydration now or in the future  -no scan, labs, dialysis -symptom management  -prognosis is days to a week  -PMT will f/u Sunday per attending's preference unless family calls us  in the interim    Questions and concerns addressed   Discussed with primary team/Dr Duane, hospice liaison, and nursing staff   Time: 45  minutes  Detailed review of medical records ( labs, imaging, vital signs), medically appropriate exam ( MS, skin, cardiac,  resp)   discussed with treatment team, counseling and education to patient, family, staff, documenting clinical information, medication management, coordination of care    Duane Mclane Jama Barnacle, DNP, [facility] Palliative Medicine Team Team Phone # [phone]  Pager # [phone]

## 2024-12-16 NOTE — Progress Notes (Signed)
°  Progress Note   Patient: Jeriko Kowalke FMW:[id number] DOB: 1956/10/23 DOA: 12/11/2024     5 DOS: the patient was seen and examined on 12/16/2024 at 9:05AM      Brief hospital course: 68 y.o. M with ESRD on HD TThS, HTN, DM, hx osteomyelitis of great toe, hep C and PVD s/p right BKA who presented with malaise, found to have pneumonia sepsis.     Assessment and Plan: Principal Problem:   Acute metabolic encephalopathy Active Problems:   Anemia of chronic disease   Hypokalemia   Aortic atherosclerosis   Gastroparesis diabeticorum (HCC)   Other emphysema (HCC)   Sepsis due to pneumonia   PAD (peripheral artery disease)   S/P BKA (below knee amputation) unilateral, right (HCC)   Thrombocytopenia   Type 2 diabetes mellitus with chronic kidney disease on chronic dialysis, without long-term current use of insulin  (HCC)   Dyslipidemia   NSTEMI (non-ST elevated myocardial infarction) (HCC)   Pressure injury of skin coccyx and left heel, stage I present on admission   Failure to thrive in adult   Hypomagnesemia    No clinical change overnight, remains delirious and somewhat agitated.  Required 5 doses of IV Ativan  in the last 24 hours - Continue as needed Ativan , Haldol , Dilaudid , Robinul , Biotene, Zofran  - Full comfort cares - Stop dialysis - Pursue hospice         Subjective: Patient is agitated overnight, sleepy today, does not wake up to talk with me.     Physical Exam: BP (!) 173/75 (BP Location: Left Arm)   Pulse 93   Temp 97.9 F (36.6 C) (Oral)   Resp 17   Wt 73.3 kg   SpO2 93%   BMI 23.86 kg/m   Adult male, lying in bed, sleeping, restless, does not engage in meaningful interactions RRR, no murmurs, no peripheral edema Respiratory rate seems normal, snoring, lung sounds diminished but no rales or wheezes appreciated Abdomen without grimace to palpation        Disposition: Status is: Inpatient         Author: [name],  MD 12/16/2024 11:10 AM  For on call review [email or web address]

## 2024-12-16 NOTE — Progress Notes (Signed)
 [facility] 5M[facility] Liaison Note  Received a request from Care Manager for family interest in [facility].  Spoke with patient's wife, Duane Hall to initiate education related to hospice philosophy, services and team approach to care. Patient was approved for [facility] and a bed was offered.    Wife has declined bed offer as she feels he is at end of life and does not wish to transfer him at this time.  Liaison encouraged Duane Hall to call [facility] with any concerns or questions.  Thank you,  Inocente Jacobs, BSN, RN [facility] Liaison [phone]

## 2024-12-16 NOTE — Progress Notes (Signed)
 Comfort care measures noted. Contacted [facility] home therapies dept to inform of this. No further support needed.   Lavanda Kiyo [facility] D[facility] Navigator [phone]

## 2024-12-16 NOTE — Plan of Care (Signed)
°  Problem: Health Behavior/Discharge Planning: Goal: Ability to identify and utilize available resources and services will improve Outcome: Progressing   Problem: Skin Integrity: Goal: Risk for impaired skin integrity will decrease Outcome: Progressing   Problem: Tissue Perfusion: Goal: Adequacy of tissue perfusion will improve Outcome: Progressing   Problem: Education: Goal: Knowledge of General Education information will improve Description: Including pain rating scale, medication(s)/side effects and non-pharmacologic comfort measures Outcome: Progressing   Problem: Health Behavior/Discharge Planning: Goal: Ability to manage health-related needs will improve Outcome: Progressing   Problem: Clinical Measurements: Goal: Ability to maintain clinical measurements within normal limits will improve Outcome: Progressing Goal: Will remain free from infection Outcome: Progressing Goal: Diagnostic test results will improve Outcome: Progressing Goal: Respiratory complications will improve Outcome: Progressing Goal: Cardiovascular complication will be avoided Outcome: Progressing   Problem: Activity: Goal: Risk for activity intolerance will decrease Outcome: Progressing   Problem: Nutrition: Goal: Adequate nutrition will be maintained Outcome: Progressing   Problem: Coping: Goal: Level of anxiety will decrease Outcome: Progressing   Problem: Elimination: Goal: Will not experience complications related to bowel motility Outcome: Progressing Goal: Will not experience complications related to urinary retention Outcome: Progressing   Problem: Pain Managment: Goal: General experience of comfort will improve and/or be controlled Outcome: Progressing   Problem: Safety: Goal: Ability to remain free from injury will improve Outcome: Progressing   Problem: Skin Integrity: Goal: Risk for impaired skin integrity will decrease Outcome: Progressing   Problem: Education: Goal:  Knowledge of the prescribed therapeutic regimen will improve Outcome: Progressing   Problem: Coping: Goal: Ability to identify and develop effective coping behavior will improve Outcome: Progressing   Problem: Clinical Measurements: Goal: Quality of life will improve Outcome: Progressing   Problem: Respiratory: Goal: Verbalizations of increased ease of respirations will increase Outcome: Progressing   Problem: Role Relationship: Goal: Family's ability to cope with current situation will improve Outcome: Progressing Goal: Ability to verbalize concerns, feelings, and thoughts to partner or family member will improve Outcome: Progressing   Problem: Pain Management: Goal: Satisfaction with pain management regimen will improve Outcome: Progressing

## 2024-12-17 DIAGNOSIS — G9341 Metabolic encephalopathy: Secondary | ICD-10-CM | POA: Diagnosis not present

## 2024-12-17 LAB — CULTURE, BLOOD (ROUTINE X 2)
Culture: NO GROWTH
Special Requests: ADEQUATE

## 2024-12-17 MED ORDER — LORAZEPAM 2 MG/ML PO CONC
1.0000 mg | ORAL | Status: DC | PRN
  Administered 2024-12-17: 1 mg via ORAL
  Filled 2024-12-17: qty 1
  Filled 2024-12-17: qty 0.5

## 2024-12-17 NOTE — Progress Notes (Signed)
" °  Progress Note   Patient: Duane Hall FMW:[id number] DOB: Jan 05, 1956 DOA: 12/11/2024     6 DOS: the patient was seen and examined on 12/17/2024        Brief hospital course: 68 y.o. M with ESRD on HD TThS, HTN, DM, hx osteomyelitis of great toe, hep C and PVD s/p right BKA who presented with malaise, found to have pneumonia sepsis.     Assessment and Plan: Principal Problem:   Acute metabolic encephalopathy Active Problems:   Anemia of chronic disease   Hypokalemia   Aortic atherosclerosis   Gastroparesis diabeticorum (HCC)   Other emphysema (HCC)   Sepsis due to pneumonia   PAD (peripheral artery disease)   S/P BKA (below knee amputation) unilateral, right (HCC)   Thrombocytopenia   Type 2 diabetes mellitus with chronic kidney disease on chronic dialysis, without long-term current use of insulin  (HCC)   Dyslipidemia   NSTEMI (non-ST elevated myocardial infarction) (HCC)   Pressure injury of skin coccyx and left heel, stage I present on admission   Failure to thrive in adult   Hypomagnesemia    No clinical change overnight, remains delirious and somewhat agitated.  Required 5 doses of IV Ativan  in the last 24 hours - Continue as needed Ativan , Haldol , Dilaudid , Robinul , Biotene, Zofran  - Full comfort cares - Stop dialysis - Pursue hospice         Subjective: Patient is agitated overnight, sleepy today, does not wake up to talk with me.     Physical Exam: BP (!) 170/72 (BP Location: Left Arm)   Pulse 82   Temp 97.9 F (36.6 C) (Oral)   Resp 18   Wt 73.3 kg   SpO2 97%   BMI 23.86 kg/m   Adult male, lying in bed, sleeping, restless, does not engage in meaningful interactions RRR, no murmurs, no peripheral edema Respiratory rate seems normal, snoring, lung sounds diminished but no rales or wheezes appreciated Abdomen without grimace to palpation        Disposition: Status is: Inpatient         Author: [name],  MD 12/17/2024 5:07 PM  For on call review [email or web address]    "

## 2024-12-17 NOTE — Progress Notes (Signed)
 [facility] 5M20 [facility] Liaison Note   Spoke with patient's wife, [name] to discuss hospice support at home and what that would look like.  She will discuss with her sons and the patient as well as work on getting her home set up.   PLEASE do not discuss discharge plans with the patient.  Please contact [name] for any questions or concerns.   Thank you,   Inocente Jacobs, BSN, RN [facility] Liaison [phone]

## 2024-12-17 NOTE — Plan of Care (Signed)

## 2024-12-18 DIAGNOSIS — G9341 Metabolic encephalopathy: Secondary | ICD-10-CM | POA: Diagnosis not present

## 2024-12-18 MED ORDER — LORAZEPAM 2 MG/ML PO CONC
1.0000 mg | ORAL | Status: DC | PRN
  Administered 2024-12-19: 1 mg via ORAL
  Filled 2024-12-18 (×2): qty 1

## 2024-12-18 MED ORDER — LORAZEPAM 2 MG/ML IJ SOLN: 1.0000 mg | INTRAMUSCULAR | Status: DC | PRN

## 2024-12-18 NOTE — Plan of Care (Signed)
" °  Problem: Health Behavior/Discharge Planning: Goal: Ability to identify and utilize available resources and services will improve Outcome: Progressing   Problem: Nutritional: Goal: Maintenance of adequate nutrition will improve Outcome: Not Progressing   "

## 2024-12-18 NOTE — Progress Notes (Signed)
" °  Progress Note   Patient: Duane Hall FMW:[id number] DOB: March 14, 1956 DOA: 12/11/2024     7 DOS: the patient was seen and examined on 12/18/2024        Brief hospital course: 68 y.o. M with ESRD on HD TThS, HTN, DM, hx osteomyelitis of great toe, hep C and PVD s/p right BKA who presented with malaise, found to have pneumonia sepsis.     Assessment and Plan: Principal Problem:   Acute metabolic encephalopathy Active Problems:   Anemia of chronic disease   Hypokalemia   Aortic atherosclerosis   Gastroparesis diabeticorum (HCC)   Other emphysema (HCC)   Sepsis due to pneumonia   PAD (peripheral artery disease)   S/P BKA (below knee amputation) unilateral, right (HCC)   Thrombocytopenia   Type 2 diabetes mellitus with chronic kidney disease on chronic dialysis, without long-term current use of insulin  (HCC)   Dyslipidemia   NSTEMI (non-ST elevated myocardial infarction) (HCC)   Pressure injury of skin coccyx and left heel, stage I present on admission   Failure to thrive in adult   Hypomagnesemia   Patient stable, no new clinical change, family working with Hospice to arrange home plans. - Continue as needed Ativan , Haldol , Dilaudid , Robinul , Biotene, Zofran  - Full comfort cares - Stop dialysis - Pursue hospice         Subjective: No clinical change, no nursing concerns.  He requires several doses of IV lorazepam  overnight again.    Physical Exam: BP (!) 155/71   Pulse 85   Temp 97.9 F (36.6 C)   Resp 18   Wt 73.3 kg   SpO2 96%   BMI 23.86 kg/m   Adult male, lying in bed, sleeping, restless, does not engage in meaningful interactions RRR, no murmurs, no peripheral edema Respiratory rate seems normal, snoring, lung sounds diminished but no rales or wheezes appreciated Abdomen without grimace to palpation        Disposition: Status is: Inpatient 68 y.o. M with ESRD admitted for pneumonia sepsis, found to have NSTEMI as well.    Patient elected  to stop HD and pursue only comfort cares.  Was recommended for [facility] place but family declined due to cost.  Currently they are working with Hospice to secure equipment and services for home hospice.        Author: [name], MD 12/18/2024 6:14 PM  For on call review [email or web address]    "

## 2024-12-18 NOTE — Plan of Care (Signed)
 " Problem: Education: Goal: Ability to describe self-care measures that may prevent or decrease complications (Diabetes Survival Skills Education) will improve Outcome: Progressing Goal: Individualized Educational Video(s) Outcome: Progressing   Problem: Coping: Goal: Ability to adjust to condition or change in health will improve Outcome: Progressing   Problem: Fluid Volume: Goal: Ability to maintain a balanced intake and output will improve Outcome: Progressing   Problem: Health Behavior/Discharge Planning: Goal: Ability to identify and utilize available resources and services will improve Outcome: Progressing Goal: Ability to manage health-related needs will improve Outcome: Progressing   Problem: Metabolic: Goal: Ability to maintain appropriate glucose levels will improve Outcome: Progressing   Problem: Nutritional: Goal: Maintenance of adequate nutrition will improve Outcome: Progressing Goal: Progress toward achieving an optimal weight will improve Outcome: Progressing   Problem: Skin Integrity: Goal: Risk for impaired skin integrity will decrease Outcome: Progressing   Problem: Tissue Perfusion: Goal: Adequacy of tissue perfusion will improve Outcome: Progressing   Problem: Education: Goal: Knowledge of General Education information will improve Description: Including pain rating scale, medication(s)/side effects and non-pharmacologic comfort measures Outcome: Progressing   Problem: Health Behavior/Discharge Planning: Goal: Ability to manage health-related needs will improve Outcome: Progressing   Problem: Clinical Measurements: Goal: Ability to maintain clinical measurements within normal limits will improve Outcome: Progressing Goal: Will remain free from infection Outcome: Progressing Goal: Diagnostic test results will improve Outcome: Progressing Goal: Respiratory complications will improve Outcome: Progressing Goal: Cardiovascular complication will  be avoided Outcome: Progressing   Problem: Activity: Goal: Risk for activity intolerance will decrease Outcome: Progressing   Problem: Nutrition: Goal: Adequate nutrition will be maintained Outcome: Progressing   Problem: Coping: Goal: Level of anxiety will decrease Outcome: Progressing   Problem: Elimination: Goal: Will not experience complications related to bowel motility Outcome: Progressing Goal: Will not experience complications related to urinary retention Outcome: Progressing   Problem: Pain Managment: Goal: General experience of comfort will improve and/or be controlled Outcome: Progressing   Problem: Safety: Goal: Ability to remain free from injury will improve Outcome: Progressing   Problem: Skin Integrity: Goal: Risk for impaired skin integrity will decrease Outcome: Progressing   Problem: Education: Goal: Knowledge of the prescribed therapeutic regimen will improve Outcome: Progressing   Problem: Coping: Goal: Ability to identify and develop effective coping behavior will improve Outcome: Progressing   Problem: Clinical Measurements: Goal: Quality of life will improve Outcome: Progressing   Problem: Education: Goal: Knowledge of General Education information will improve Description: Including pain rating scale, medication(s)/side effects and non-pharmacologic comfort measures Outcome: Progressing   Problem: Health Behavior/Discharge Planning: Goal: Ability to manage health-related needs will improve Outcome: Progressing   Problem: Clinical Measurements: Goal: Ability to maintain clinical measurements within normal limits will improve Outcome: Progressing Goal: Will remain free from infection Outcome: Progressing Goal: Diagnostic test results will improve Outcome: Progressing Goal: Respiratory complications will improve Outcome: Progressing Goal: Cardiovascular complication will be avoided Outcome: Progressing   Problem: Activity: Goal:  Risk for activity intolerance will decrease Outcome: Progressing   Problem: Nutrition: Goal: Adequate nutrition will be maintained Outcome: Progressing   Problem: Coping: Goal: Level of anxiety will decrease Outcome: Progressing   Problem: Elimination: Goal: Will not experience complications related to bowel motility Outcome: Progressing Goal: Will not experience complications related to urinary retention Outcome: Progressing   Problem: Pain Managment: Goal: General experience of comfort will improve and/or be controlled Outcome: Progressing   Problem: Safety: Goal: Ability to remain free from injury will improve Outcome: Progressing   Problem: Skin Integrity:  Goal: Risk for impaired skin integrity will decrease Outcome: Progressing   "

## 2024-12-18 NOTE — Progress Notes (Signed)
 [facility] 5M20 [facility] Liaison Note   Received request initially from [place], [employer], for residential hospice services. Wife changed mind and would now like hospice services at home after discharge. Spoke with wife [name] to initiate education related to hospice philosophy, services, and team approach to care. Patient/family verbalized understanding of information given. Per discussion, the plan is for discharge home by PTAR/EMS likely on Tuesday after DME has been delivered.  DME needs discussed. Patient has the following equipment in the home: [facility], Wheelchair  Patient/family requests the following equipment for delivery: Hospital Bed (half rails), hoyer lift, overbed table  The address has been verified and is correct in the chart. [name] is the family contact to arrange time of equipment delivery.   Please send signed and completed DNR home with patient/family. Please provide prescriptions at discharge as needed to ensure ongoing symptom management.   [facility] information and contact numbers given to [name]  Above information shared with Care Manager and Hospital Team.   Please call with any questions or concerns.   Thank you for the opportunity to participate in this patients care.      Nat Babe, BSN, RN [employer]  [phone]

## 2024-12-19 ENCOUNTER — Encounter (HOSPITAL_COMMUNITY): Payer: Self-pay | Admitting: Internal Medicine

## 2024-12-19 ENCOUNTER — Other Ambulatory Visit: Payer: Self-pay

## 2024-12-19 DIAGNOSIS — J438 Other emphysema: Secondary | ICD-10-CM

## 2024-12-19 DIAGNOSIS — D638 Anemia in other chronic diseases classified elsewhere: Secondary | ICD-10-CM

## 2024-12-19 DIAGNOSIS — N189 Chronic kidney disease, unspecified: Secondary | ICD-10-CM

## 2024-12-19 DIAGNOSIS — E1143 Type 2 diabetes mellitus with diabetic autonomic (poly)neuropathy: Secondary | ICD-10-CM

## 2024-12-19 DIAGNOSIS — G9341 Metabolic encephalopathy: Secondary | ICD-10-CM | POA: Diagnosis not present

## 2024-12-19 DIAGNOSIS — J189 Pneumonia, unspecified organism: Secondary | ICD-10-CM

## 2024-12-19 DIAGNOSIS — I350 Nonrheumatic aortic (valve) stenosis: Secondary | ICD-10-CM

## 2024-12-19 DIAGNOSIS — A419 Sepsis, unspecified organism: Principal | ICD-10-CM

## 2024-12-19 MED ORDER — HYDROMORPHONE HCL 1 MG/ML IJ SOLN: 2.0000 mg | INTRAMUSCULAR | Status: DC | PRN

## 2024-12-19 MED ORDER — LORAZEPAM 2 MG/ML IJ SOLN: 1.0000 mg | Freq: Four times a day (QID) | INTRAMUSCULAR | Status: DC

## 2024-12-19 MED ORDER — LORAZEPAM 2 MG/ML PO CONC: 1.0000 mg | Freq: Four times a day (QID) | ORAL | Status: DC

## 2024-12-19 MED ORDER — LORAZEPAM 2 MG/ML IJ SOLN: 2.0000 mg | Freq: Four times a day (QID) | INTRAMUSCULAR | Status: DC

## 2024-12-19 MED ORDER — LORAZEPAM 2 MG/ML PO CONC: 2.0000 mg | Freq: Four times a day (QID) | ORAL | Status: DC

## 2024-12-19 MED ORDER — BIOTENE DRY MOUTH MT LIQD
15.0000 mL | Freq: Two times a day (BID) | OROMUCOSAL | Status: DC
  Administered 2024-12-20: 15 mL via TOPICAL

## 2024-12-19 MED ORDER — LORAZEPAM 2 MG/ML PO CONC
1.0000 mg | ORAL | Status: DC | PRN
  Administered 2024-12-19 – 2024-12-20 (×2): 2 mg via ORAL
  Filled 2024-12-19 (×3): qty 1

## 2024-12-19 NOTE — Progress Notes (Signed)
" °  Progress Note   Patient: Duane Hall FMW:[id number] DOB: 04-Jul-1956 DOA: 12/11/2024     8 DOS: the patient was seen and examined on 12/19/2024        Brief hospital course: 68 year old male with ESRD TTS admit 12/13 left lower lobe pneumonia pulmonary edema elevated troponins He was brought in with altered mental status vomiting poor quality of life Tmax 101 troponin 1500 peaking at 2100--- was quite thrombocytopenic to 44 and hence not a candidate for cath per cardiology Palliative care ultimately was consulted and the plan was for home with comfort care This is being coordinated by [facility]     Assessment and Plan: Principal Problem:   Acute metabolic encephalopathy Active Problems:   Anemia of chronic disease   Hypokalemia   Aortic atherosclerosis   Gastroparesis diabeticorum (HCC)   Other emphysema (HCC)   Sepsis due to pneumonia   PAD (peripheral artery disease)   S/P BKA (below knee amputation) unilateral, right (HCC)   Thrombocytopenia   Type 2 diabetes mellitus with chronic kidney disease on chronic dialysis, without long-term current use of insulin  (HCC)   Dyslipidemia   NSTEMI (non-ST elevated myocardial infarction) (HCC)   Pressure injury of skin coccyx and left heel, stage I present on admission   Failure to thrive in adult   Hypomagnesemia   Patient stable, no new clinical change, family working with Hospice to arrange home plans. - Continue as needed Ativan , Haldol , Dilaudid , Robinul , Biotene, Zofran  - Full comfort cares - Stop dialysis - Pursue hospice-likely on Tuesday evening all equipment etc. will be ready for delivery -I have asked that nursing help the wife learn how to use a Hoyer    Subjective: Requiring meds for pain-somewhat agitated and wants to go home No other complaints    Physical Exam: BP (!) 181/82 (BP Location: Left Arm)   Pulse 87   Temp 98.2 F (36.8 C) (Oral)   Resp 14   Wt 73.3 kg   SpO2 95%   BMI 23.86 kg/m    Awake alert no distress Chest is clear       Disposition: Status is: Inpatient 68 y.o. M with ESRD admitted for pneumonia sepsis, found to have NSTEMI as well.    Patient elected to stop HD and pursue only comfort cares.  Was recommended for [facility] place but family declined due to cost.  Currently they are working with Hospice to secure equipment and services for home hospice.    Author: Colen Grimes, MD 12/19/2024 2:25 PM  For on call review [email or web address]    "

## 2024-12-19 NOTE — Progress Notes (Signed)
 "                                                                                                                                                                                                         Daily Progress Note   Patient Name: Duane Hall       Date: 12/19/2024 DOB: July 27, 1956  Age: 68 y.o. MRN#: [id number] Attending Physician: Samtani, Jai-Gurmukh, MD Primary Care Physician: Lazoff, Shawn P, DO Admit Date: 12/11/2024  Reason for Consultation/Follow-up: Non pain symptom management, Pain control, Psychosocial/spiritual support, and Terminal Care  Subjective: I have reviewed medical records including: EPIC notes: Hospitalist, hospice liaison, nursing, PT, nephrology, TOC.  Plan is for patient's discharge home with hospice likely Tuesday 12/23 after DME delivery. MAR: As needed medications administered in the last 24 hours-Ativan  x 1 Available advanced directives: in ACP - none. In shadow chart - durable DNR form Labs: Creatinine 8.20 assessed for opioid prescribing and prognostication  Received report from primary RN -no acute concerns.  RN reports that patient remains confused, has been very combative and agitated, trying to get up, pulled out IV, not following commands.  RN reports current Ativan  order minimally effective; also had a hard time getting patient to take oral medication as he no longer has an IV.  Per RN, patient does not recognize his mobility limitations with BKA.  Went to visit patient at bedside-no family/visitors present.  Patient was lying in bed asleep-did not attempt to wake to preserve comfort. No signs or non-verbal gestures of pain or discomfort noted. No respiratory distress, increased work of breathing, or secretions noted.   Symptom management treatment plan discussed with RN -will schedule Ativan  p.o./IM every 6 hours.  Will also increase as needed dose range from 1 -2 mg and increase interval to allow administration every 1 hour as needed.   Length of  Stay: 8  Current Medications: Scheduled Meds:   antiseptic oral rinse  15 mL Topical BID    Continuous Infusions:   PRN Meds: acetaminophen  **OR** acetaminophen , artificial tears, glycopyrrolate  **OR** glycopyrrolate  **OR** glycopyrrolate , haloperidol  **OR** haloperidol  **OR** haloperidol  lactate, HYDROmorphone  (DILAUDID ) injection, LORazepam  **OR** LORazepam , ondansetron  **OR** ondansetron  (ZOFRAN ) IV  Physical Exam Vitals and nursing note reviewed.  Constitutional:      General: He is not in acute distress.    Appearance: He is ill-appearing.  Pulmonary:     Effort: No respiratory distress.  Skin:    General: Skin is warm and dry.             Vital Signs: BP (!) 181/82 (  BP Location: Left Arm)   Pulse 87   Temp 98.2 F (36.8 C) (Oral)   Resp 14   Wt 73.3 kg   SpO2 95%   BMI 23.86 kg/m  SpO2: SpO2: 95 % O2 Device: O2 Device: Room Air O2 Flow Rate: O2 Flow Rate (L/min): 2 L/min  Intake/output summary: No intake or output data in the 24 hours ending 12/19/24 1059 LBM: Last BM Date : 12/18/24 Baseline Weight: Weight: 73.3 kg Most recent weight: Weight: 73.3 kg       Palliative Assessment/Data: PPS 30%      Patient Active Problem List   Diagnosis Date Noted   Pressure injury of skin coccyx and left heel, stage I present on admission 12/15/2024   Failure to thrive in adult 12/15/2024   Hypomagnesemia 12/15/2024   NSTEMI (non-ST elevated myocardial infarction) (HCC) 12/12/2024   Acute metabolic encephalopathy 12/11/2024   Dyslipidemia 05/05/2024   Amputation of left great toe 04/29/2023   History of right below knee amputation (HCC) 04/29/2023   Type 2 diabetes mellitus with proliferative retinopathy of both eyes, with long-term current use of insulin  (HCC) 04/29/2023   Type 2 diabetes mellitus with chronic kidney disease on chronic dialysis, without long-term current use of insulin  (HCC) 04/29/2023   Degeneration of lumbar intervertebral disc 05/18/2021    Drug induced constipation    Thrombocytopenia    Labile blood pressure    S/P BKA (below knee amputation) unilateral, right (HCC)    Right BKA infection (HCC)    Gangrene of right foot (HCC) 03/16/2021   PAD (peripheral artery disease)    Osteomyelitis of great toe of left foot (HCC)    Sepsis due to pneumonia 01/11/2021   Cellulitis of right foot 01/11/2021   Lumbar spondylosis 12/26/2020   Lumbar radiculopathy 10/05/2020   Left atrial dilation 08/12/2020   Acute osteomyelitis of left calcaneus (HCC)    Bilateral pseudophakia 07/28/2020   Stable proliferative diabetic retinopathy of both eyes associated with type 2 diabetes mellitus (HCC) 07/28/2020   Diabetic foot ulcer (HCC) 07/27/2020   ESRD on hemodialysis (HCC) 07/27/2020   Gastroparesis diabeticorum (HCC) 07/02/2020   Vascular insufficiency of extremity 06/18/2020   Awaiting transplantation of kidney 06/18/2020   Hemodialysis-associated hypotension 05/01/2020   Aortic atherosclerosis 02/28/2020   Other disorders of phosphorus metabolism 10/14/2019   Secondary hyperparathyroidism of renal origin 10/14/2019   Complication of vascular dialysis catheter 10/09/2019   Hypercalcemia 10/07/2019   Hyperlipidemia, unspecified 10/07/2019   Tobacco use 10/07/2019   Unspecified glaucoma 10/07/2019   Coagulation defect, unspecified 10/05/2019   Hypokalemia 09/09/2019   Other emphysema (HCC) 11/23/2018   Benign prostatic hyperplasia with weak urinary stream 11/18/2018   Pure hypercholesterolemia 11/18/2018   Uncontrolled type 2 diabetes mellitus with hyperglycemia (HCC) 11/18/2018   Diabetic retinopathy (HCC) 08/25/2015   Claudication 04/07/2015   Anemia of chronic disease 09/12/2014   Hepatitis C 10/15/2013   Pain in joint, shoulder region 02/10/2013   Diabetes mellitus type 2 in nonobese [facility]) 02/24/2012   Hypertension 02/24/2012   Asthma 02/24/2012    Palliative Care Assessment & Plan   Patient Profile: 68 y.o. male    admitted on 12/11/2024 with past medical history  of  hypertension, diabetes, hepatitis C, osteomyelitis of the left great toe status post amputation coming for AMS.    Admitted through the emergency after a reported fall from patient's wife.  Family report continued physical functional and cognitive decline over the past many months.   Admitted for treatment  stabilization.     Initial PMT family meeting 12-14-24.  Decision made at that time to shift to full comfort, focus is comfort and dignity allowing for a natural death.    Patient and family face treatment option decision, advanced directive decisions and anticipatory care needs.  Assessment: Principal Problem:   Acute metabolic encephalopathy Active Problems:   Anemia of chronic disease   Hypokalemia   Aortic atherosclerosis   Gastroparesis diabeticorum (HCC)   Other emphysema (HCC)   Sepsis due to pneumonia   PAD (peripheral artery disease)   S/P BKA (below knee amputation) unilateral, right (HCC)   Thrombocytopenia   Type 2 diabetes mellitus with chronic kidney disease on chronic dialysis, without long-term current use of insulin  (HCC)   Dyslipidemia   NSTEMI (non-ST elevated myocardial infarction) (HCC)   Pressure injury of skin coccyx and left heel, stage I present on admission   Failure to thrive in adult   Hypomagnesemia    Terminal care  Recommendations/Plan: Continue full comfort measures Continue DNR/DNI as previously documented Plan is for discharge home with hospice, likely Tuesday 12/23 Comfort focused medication regimen adjusted as noted below in bold.  Continue previously placed comfort focused orders as noted below Added orders to reflect full comfort measures, as well as discontinued orders that were not focused on comfort Unrestricted visitation orders were placed per current [place] [facility] visitation policy  PMT will continue to follow and support holistically  Symptom Management Dilaudid  IV PRN  pain/dyspnea/increased work of breathing/RR>25 Tylenol  PRN pain/fever Biotin twice daily Robinul  PRN secretions Haldol  PRN agitation/delirium Scheduled Ativan  every 6 hours with linked p.o./IM orders; adjusted dose range for 1-2 mg and increase time interval to every hour as needed for anxiety/seizure/sleep/distress Zofran  PRN nausea/vomiting Liquifilm Tears PRN dry eye   Goals of Care and Additional Recommendations: Limitations on Scope of Treatment: Full Comfort Care  Code Status:    Code Status Orders  (From admission, onward)           Start     Ordered   12/14/24 1735  Do not attempt resuscitation (DNR) - Comfort care  Continuous       Question Answer Comment  If patient has no pulse and is not breathing Do Not Attempt Resuscitation   In Pre-Arrest Conditions (Patient Is Breathing and Has a Pulse) Provide comfort measures. Relieve any mechanical airway obstruction. Avoid transfer unless required for comfort.   Consent: Discussion documented in EHR or advanced directives reviewed      12/14/24 1735           Code Status History     Date Active Date Inactive Code Status Order ID Comments User Context   12/11/2024 1901 12/14/2024 1735 Limited: Do not attempt resuscitation (DNR) -DNR-LIMITED -Do Not Intubate/DNI  [id number]  Tobie Mario GAILS, MD ED   12/11/2024 1814 12/11/2024 1901 Limited: Do not attempt resuscitation (DNR) -DNR-LIMITED -Do Not Intubate/DNI  [id number]  Tobie Mario GAILS, MD ED   10/21/2022 1332 10/21/2022 2118 Full Code [id number]  Sheree Penne Bruckner, MD Inpatient   03/16/2021 1725 03/26/2021 2012 Full Code [id number]  Babs Arthea DASEN, MD Inpatient   03/16/2021 1725 03/16/2021 1725 Full Code [id number]  Babs Arthea DASEN, MD Inpatient   03/08/2021 1134 03/16/2021 1652 Full Code [id number]  Sim Emery CROME, MD ED   01/11/2021 2048 01/13/2021 1724 Full Code [id number]  Franky Redia SAILOR, MD ED   12/25/2020 1042 12/25/2020 1750 Full Code [id number]  Sheree Penne Bruckner, MD Inpatient  07/27/2020 0523 08/01/2020 2235 Full Code [id number]  Laveda Roosevelt, MD ED   06/18/2020 2131 06/20/2020 1631 Full Code [id number]  Dotti Mitzie SAILOR, MD ED   09/08/2019 2251 09/14/2019 2244 Full Code [id number]  Alfornia Madison, MD ED       Prognosis:  < 2 weeks  Discharge Planning: Home with Hospice  Care plan was discussed with primary RN  Thank you for allowing the Palliative Medicine Team to assist in the care of this patient.  Billing based on MDM: High  Problems Addressed: One acute or chronic illness or injury that poses a threat to life or bodily function  Risks: Parenteral controlled substances     Watt Geiler CHRISTELLA Sharps, NP  Please contact Palliative Medicine Team phone at [phone] for questions and concerns.   *Portions of this note are a verbal dictation therefore any spelling and/or grammatical errors are due to the Dragon Medical One system interpretation.     "

## 2024-12-20 NOTE — Progress Notes (Signed)
 "                                                                                                                                                                                                         Daily Progress Note   Patient Name: Duane Hall       Date: 12/20/2024 DOB: [birth year]  Age: 68 y.o. MRN#: [id number] Attending Physician: Samtani, Jai-Gurmukh, MD Primary Care Physician: Lazoff, Shawn P, DO Admit Date: 12/11/2024  Reason for Consultation/Follow-up: Pain control, Psychosocial/spiritual support, and Terminal Care  Subjective: I have reviewed medical records including: EPIC notes: Hospitalist, TOC. Plan is for patient's discharge home with hospice likely Tuesday 12/23 after DME delivery.  MAR: 0 scheduled doses of Ativan  have been administered.  As needed medications administered in the last 24 hours-Ativan  x 3 Available advanced directives in ACP: None Labs: Creatinine 8.20 assessed for opioid prescribing and prognostication   Received report from primary RN -no acute concerns.  RN reports patient is refusing care.  Went to visit patient at bedside-no family/visitors present.  Patient was lying in bed awake and confused.  Signs/non-verbal gestures of discomfort noted to include restlessness. No respiratory distress, increased work of breathing, or secretions noted.   Requested RN provide dose of Ativan .  Length of Stay: 9  Current Medications: Scheduled Meds:   antiseptic oral rinse  15 mL Topical BID   LORazepam   1 mg Oral Q6H   Or   LORazepam   1 mg Intramuscular Q6H    Continuous Infusions:   PRN Meds: acetaminophen  **OR** acetaminophen , artificial tears, glycopyrrolate  **OR** glycopyrrolate  **OR** glycopyrrolate , haloperidol  **OR** haloperidol  **OR** haloperidol  lactate, HYDROmorphone  (DILAUDID ) injection, LORazepam  **OR** [DISCONTINUED] LORazepam , ondansetron  **OR** ondansetron  (ZOFRAN ) IV  Physical Exam Vitals and nursing note reviewed.  Constitutional:       General: He is not in acute distress.    Appearance: He is ill-appearing.  Pulmonary:     Effort: No respiratory distress.  Skin:    General: Skin is warm and dry.             Vital Signs: BP (!) 166/77 (BP Location: Left Arm)   Pulse 87   Temp (!) 96.3 F (35.7 C) Comment: Axillary  Resp 16   Ht 5' 11 (1.803 m)   Wt 73.3 kg   SpO2 96%   BMI 22.54 kg/m  SpO2: SpO2: 96 % O2 Device: O2 Device: Room Air O2 Flow Rate: O2 Flow Rate (L/min): 2 L/min  Intake/output summary:  Intake/Output Summary (Last 24 hours) at 12/20/2024 9063 Last data filed at 12/20/2024  0800 Gross per 24 hour  Intake 0 ml  Output 0 ml  Net 0 ml   LBM: Last BM Date : 12/19/24 Baseline Weight: Weight: 73.3 kg Most recent weight: Weight: 73.3 kg       Palliative Assessment/Data: PPS 20%      Patient Active Problem List   Diagnosis Date Noted   Pressure injury of skin coccyx and left heel, stage I present on admission 12/15/2024   Failure to thrive in adult 12/15/2024   Hypomagnesemia 12/15/2024   NSTEMI (non-ST elevated myocardial infarction) (HCC) 12/12/2024   Acute metabolic encephalopathy 12/11/2024   Dyslipidemia 05/05/2024   Amputation of left great toe 04/29/2023   History of right below knee amputation (HCC) 04/29/2023   Type 2 diabetes mellitus with proliferative retinopathy of both eyes, with long-term current use of insulin  (HCC) 04/29/2023   Type 2 diabetes mellitus with chronic kidney disease on chronic dialysis, without long-term current use of insulin  (HCC) 04/29/2023   Degeneration of lumbar intervertebral disc 05/18/2021   Drug induced constipation    Thrombocytopenia    Labile blood pressure    S/P BKA (below knee amputation) unilateral, right (HCC)    Right BKA infection (HCC)    Gangrene of right foot (HCC) 03/16/2021   PAD (peripheral artery disease)    Osteomyelitis of great toe of left foot (HCC)    Sepsis due to pneumonia 01/11/2021   Cellulitis of right foot  01/11/2021   Lumbar spondylosis 12/26/2020   Lumbar radiculopathy 10/05/2020   Left atrial dilation 08/12/2020   Acute osteomyelitis of left calcaneus (HCC)    Bilateral pseudophakia 07/28/2020   Stable proliferative diabetic retinopathy of both eyes associated with type 2 diabetes mellitus (HCC) 07/28/2020   Diabetic foot ulcer (HCC) 07/27/2020   ESRD on hemodialysis (HCC) 07/27/2020   Gastroparesis diabeticorum (HCC) 07/02/2020   Vascular insufficiency of extremity 06/18/2020   Awaiting transplantation of kidney 06/18/2020   Hemodialysis-associated hypotension 05/01/2020   Aortic atherosclerosis 02/28/2020   Other disorders of phosphorus metabolism 10/14/2019   Secondary hyperparathyroidism of renal origin 10/14/2019   Complication of vascular dialysis catheter 10/09/2019   Hypercalcemia 10/07/2019   Hyperlipidemia, unspecified 10/07/2019   Tobacco use 10/07/2019   Unspecified glaucoma 10/07/2019   Coagulation defect, unspecified 10/05/2019   Hypokalemia 09/09/2019   Other emphysema (HCC) 11/23/2018   Benign prostatic hyperplasia with weak urinary stream 11/18/2018   Pure hypercholesterolemia 11/18/2018   Uncontrolled type 2 diabetes mellitus with hyperglycemia (HCC) 11/18/2018   Diabetic retinopathy (HCC) 08/25/2015   Claudication 04/07/2015   Anemia of chronic disease 09/12/2014   Hepatitis C 10/15/2013   Pain in joint, shoulder region 02/10/2013   Diabetes mellitus type 2 in nonobese [facility]) 02/24/2012   Hypertension 02/24/2012   Asthma 02/24/2012    Palliative Care Assessment & Plan   Patient Profile: 68 y.o. male   admitted on 12/11/2024 with past medical history  of  hypertension, diabetes, hepatitis C, osteomyelitis of the left great toe status post amputation coming for AMS.    Admitted through the emergency after a reported fall from patient's wife.  Family report continued physical functional and cognitive decline over the past many months.   Admitted for  treatment stabilization.     Initial PMT family meeting 12-14-24.  Decision made at that time to shift to full comfort, focus is comfort and dignity allowing for a natural death.    Patient and family face treatment option decision, advanced directive decisions and anticipatory care needs.  Assessment:  Principal Problem:   Acute metabolic encephalopathy Active Problems:   Anemia of chronic disease   Hypokalemia   Aortic atherosclerosis   Gastroparesis diabeticorum (HCC)   Other emphysema (HCC)   Sepsis due to pneumonia   PAD (peripheral artery disease)   S/P BKA (below knee amputation) unilateral, right (HCC)   Thrombocytopenia   Type 2 diabetes mellitus with chronic kidney disease on chronic dialysis, without long-term current use of insulin  (HCC)   Dyslipidemia   NSTEMI (non-ST elevated myocardial infarction) (HCC)   Pressure injury of skin coccyx and left heel, stage I present on admission   Failure to thrive in adult   Hypomagnesemia   Terminal care  Recommendations/Plan: Continue full comfort measures Continue DNR/DNI as previously documented Plan is for discharge home with hospice, likely Tuesday 12/23 No further HD Continue palliative wound care Focused medication regimen adjusted as noted below in bold.  Continue previously placed comfort focused orders as noted below PMT will continue to follow and support holistically  Symptom Management Dilaudid  IV PRN pain/dyspnea/increased work of breathing/RR>25 Tylenol  PRN pain/fever Biotin twice daily Robinul  PRN secretions Haldol  PRN agitation/delirium Discontinued scheduled Ativan ; continue as needed doses for anxiety/seizure/sleep/distress Zofran  PRN nausea/vomiting Liquifilm Tears PRN dry eye  Goals of Care and Additional Recommendations: Limitations on Scope of Treatment: Full Comfort Care  Code Status:    Code Status Orders  (From admission, onward)           Start     Ordered   12/14/24 1735  Do not  attempt resuscitation (DNR) - Comfort care  Continuous       Question Answer Comment  If patient has no pulse and is not breathing Do Not Attempt Resuscitation   In Pre-Arrest Conditions (Patient Is Breathing and Has a Pulse) Provide comfort measures. Relieve any mechanical airway obstruction. Avoid transfer unless required for comfort.   Consent: Discussion documented in EHR or advanced directives reviewed      12/14/24 1735           Code Status History     Date Active Date Inactive Code Status Order ID Comments User Context   12/11/2024 1901 12/14/2024 1735 Limited: Do not attempt resuscitation (DNR) -DNR-LIMITED -Do Not Intubate/DNI  [id number]  Tobie Mario GAILS, MD ED   12/11/2024 1814 12/11/2024 1901 Limited: Do not attempt resuscitation (DNR) -DNR-LIMITED -Do Not Intubate/DNI  [id number]  Tobie Mario GAILS, MD ED   10/21/2022 1332 10/21/2022 2118 Full Code [id number]  Sheree Penne Bruckner, MD Inpatient   03/16/2021 1725 03/26/2021 2012 Full Code [id number]  Babs Arthea DASEN, MD Inpatient   03/16/2021 1725 03/16/2021 1725 Full Code [id number]  Babs Arthea DASEN, MD Inpatient   03/08/2021 1134 03/16/2021 1652 Full Code [id number]  Sim Emery CROME, MD ED   01/11/2021 2048 01/13/2021 1724 Full Code [id number]  Franky Redia SAILOR, MD ED   12/25/2020 1042 12/25/2020 1750 Full Code [id number]  Sheree Penne Bruckner, MD Inpatient   07/27/2020 0523 08/01/2020 2235 Full Code [id number]  Laveda Roosevelt, MD ED   06/18/2020 2131 06/20/2020 1631 Full Code [id number]  Dotti Mitzie SAILOR, MD ED   09/08/2019 2251 09/14/2019 2244 Full Code [id number]  Alfornia Madison, MD ED       Prognosis:  < 2 weeks  Discharge Planning: Home with Hospice  Care plan was discussed with primary RN  Thank you for allowing the Palliative Medicine Team to assist in the care of this patient.   Billing based on MDM: High  Problems Addressed: One  acute or chronic illness or injury that poses a threat to life or bodily  function  Risks: Parenteral controlled substances     Nicolina Hirt CHRISTELLA Sharps, NP  Please contact Palliative Medicine Team phone at [phone] for questions and concerns.   *Portions of this note are a verbal dictation therefore any spelling and/or grammatical errors are due to the Dragon Medical One system interpretation.     "

## 2024-12-20 NOTE — Progress Notes (Signed)
 [facility] 5M20 [facility] Liaison Note   Spoke with wife via telephone.  She states DME will be delivered today and patient to discharge home on Tuesday via [facility].  Patient will need to be home by 4:00pm for admission visit.  DME delivery has been confirmed.  Please send signed and completed DNR home with patient/family. Please provide prescriptions at discharge as needed to ensure ongoing symptom management.   [facility] information and contact numbers given to [name]  Above information shared with Care Manager and Hospital Team.   Please call with any questions or concerns.   Thank you for the opportunity to participate in this patients care.     Inocente Jacobs, BSN, RN [employer]  [phone]

## 2024-12-20 NOTE — Progress Notes (Signed)
 Patient seen at the bedside several times this afternoon asleep-he woke up this afternoon and now is sitting on the toilet His wife expresses some concern about him going home given his reticence to participate with the plan of care-he seems to want to get up and move around although it may not be safe to Hospice has accepted the patient and transport home is planned for around 4 PM for them to meet him there  I expressed to Nirav the care team in addition to his wife only want things to be comfortable for him in his final days--- I expressed to him that although he may not agree with our discussions about not getting up we would not want to add more discomfort to an already difficult process for him--I asked him to reflect on this.   BP (!) 166/77 (BP Location: Left Arm)   Pulse 87   Temp (!) 96.3 F (35.7 C) Comment: Axillary  Resp 16   Ht 5' 11 (1.803 m)   Wt 73.3 kg   SpO2 96%   BMI 22.54 kg/m  ' Chest is clear no wheeze no rales no rhonchi He is alert Speech is a little bit slow but he is clear and coherent Rest of exam deferred as he is on potty chair  We will plan for discharge so that Maude can take him home at around 4 PM tomorrow   Jai Jaydeen Odor, MD [facility] 6:17 PM

## 2024-12-21 ENCOUNTER — Ambulatory Visit: Admitting: Podiatry

## 2024-12-21 DIAGNOSIS — G9341 Metabolic encephalopathy: Secondary | ICD-10-CM | POA: Diagnosis not present

## 2024-12-21 MED ORDER — HALOPERIDOL LACTATE 2 MG/ML PO CONC: 0.5000 mg | ORAL | 0 refills | Status: DC | PRN

## 2024-12-21 MED ORDER — LORAZEPAM 2 MG/ML PO CONC: 1.0000 mg | ORAL | 0 refills | Status: AC | PRN

## 2024-12-21 MED ORDER — HALOPERIDOL LACTATE 2 MG/ML PO CONC: 0.5000 mg | ORAL | 0 refills | Status: AC | PRN

## 2024-12-21 MED ORDER — LORAZEPAM 2 MG/ML PO CONC: 1.0000 mg | ORAL | 0 refills | Status: DC | PRN

## 2024-12-21 NOTE — Plan of Care (Signed)
 Pt moving self in bed. Sitting on side of bed and can hold trunk steady for long periods. He denies pain or needs. Drinking a soda. D/c home at 1400 and hospice will meet him at home at 1600. Wife was updated by SW.   Problem: Education: Goal: Ability to describe self-care measures that may prevent or decrease complications (Diabetes Survival Skills Education) will improve Outcome: Adequate for Discharge Goal: Individualized Educational Video(s) Outcome: Adequate for Discharge   Problem: Coping: Goal: Ability to adjust to condition or change in health will improve Outcome: Adequate for Discharge   Problem: Fluid Volume: Goal: Ability to maintain a balanced intake and output will improve Outcome: Adequate for Discharge   Problem: Health Behavior/Discharge Planning: Goal: Ability to identify and utilize available resources and services will improve Outcome: Adequate for Discharge Goal: Ability to manage health-related needs will improve Outcome: Adequate for Discharge   Problem: Nutritional: Goal: Maintenance of adequate nutrition will improve Outcome: Adequate for Discharge Goal: Progress toward achieving an optimal weight will improve Outcome: Adequate for Discharge   Problem: Skin Integrity: Goal: Risk for impaired skin integrity will decrease Outcome: Adequate for Discharge

## 2024-12-21 NOTE — Discharge Summary (Signed)
 Physician Discharge Summary  Duane Hall FMW:[id number] DOB: [birth year] DOA: 12/11/2024  PCP: Lazoff, Shawn P, DO  Admit date: 12/11/2024 Discharge date: 12/21/2024  Time spent: 33 minutes  Recommendations for Outpatient Follow-up:  Patient discharging home c Hospice agency  Discharge Diagnoses:  MAIN problem for hospitalization   Pneumonia in the setting of ESRD and thrombocytopenia in the setting of NSTEMI not a candidate for cath Encounter for palliative care/hospice  Please see below for itemized issues addressed in [facility]ps[facility]- refer to other progress notes for clarity if needed  Discharge Condition: Guarded  Diet recommendation: Regular  Filed Weights   12/12/24 0043 12/14/24 0432  Weight: 73.3 kg 73.3 kg    History of present illness:  68 year old male with ESRD TTS admit 12/13 left lower lobe pneumonia pulmonary edema elevated troponins He was brought in with altered mental status vomiting poor quality of life Tmax 101 troponin 1500 peaking at 2100--- was quite thrombocytopenic to 44 and hence not a candidate for cath per cardiology Palliative care ultimately was consulted and the plan was for home with comfort care This is being coordinated by [facility]    Assessment and Plan: Principal Problem:   Acute metabolic encephalopathy Active Problems:   Anemia of chronic disease   Hypokalemia   Aortic atherosclerosis   Gastroparesis diabeticorum (HCC)   Other emphysema (HCC)   Sepsis due to pneumonia   PAD (peripheral artery disease)   S/Hall BKA (below knee amputation) unilateral, right (HCC)   Thrombocytopenia   Type 2 diabetes mellitus with chronic kidney disease on chronic dialysis, without long-term current use of insulin  (HCC)   Dyslipidemia   NSTEMI (non-ST elevated myocardial infarction) (HCC)   Pressure injury of skin coccyx and left heel, stage I present on admission   Failure to thrive in adult   Hypomagnesemia     Patient stable, no new clinical  change, family working with Hospice to arrange home plans. - Continue as needed Ativan , Haldol , Dilaudid , Robinul , Biotene, Zofran  - Full comfort cares - Stop dialysis - Pursue hospice-likely on Tuesday evening all equipment etc. will be ready for delivery - Multiple conversations with wife/patient who was a little reticent to cooperate with interventions and really just wanted to go home----delivery of equipment will be done today by hospice agency      Discharge Exam: Vitals:   12/20/24 0756 12/20/24 2023  BP: (!) 166/77 (!) 164/66  Pulse: 87 89  Resp: 16 19  Temp: (!) 96.3 F (35.7 C) (!) 97.5 F (36.4 C)  SpO2: 96% 95%    Subj on day of d/c   Seen and examined in room this morning-noncompliant with interventions-does not want to use Hoyer lift Wife not present Overall rather despondent and flat affect  General Exam on discharge  EOMI NCAT no focal deficit Chest is clear Abdomen is soft no rebound Right lower extremity surgically absent below knee NSR on exam  Discharge Instructions   Discharge Instructions     Increase activity slowly   Complete by: As directed    No wound care   Complete by: As directed       Allergies as of 12/21/2024       Reactions   Amoxicillin -pot Clavulanate Diarrhea   Midodrine  Other (See Comments)   Other reaction(s): Urinary Sensation   Tape Other (See Comments)   Latex band aids cause blistering        Medication List     TAKE these medications    aspirin  EC 81 MG  tablet Take 1 tablet (81 mg total) by mouth daily. Swallow whole.   clopidogrel  75 MG tablet Commonly known as: PLAVIX  Take 1 tablet (75 mg total) by mouth daily with breakfast.   Dexcom G7 Sensor Misc 1 Device by Other route as directed. 1 sensor every 10 days   gentamicin  ointment 0.1 % Commonly known as: GARAMYCIN  Apply 1 Application topically daily. Apply to wound daily   haloperidol  2 MG/ML solution Commonly known as: HALDOL  Place 0.3 mLs  (0.6 mg total) under the tongue every 4 (four) hours as needed for agitation (or delirium).   insulin  lispro 100 UNIT/ML injection Commonly known as: HumaLOG  FOR USE IN PUMP, TOTAL OF 50 UNITS DAILY   LORazepam  2 MG/ML concentrated solution Commonly known as: ATIVAN  Take 0.5-1 mLs (1-2 mg total) by mouth every hour as needed for anxiety, seizure or sleep (agitation).   MIRCERA IJ Inject into the skin.   mupirocin  ointment 2 % Commonly known as: BACTROBAN  Apply 1 Application topically daily. Recommend 30 g tube for 30 day supply of mupirocin  to apply to wound on outside of foot and on blister once daily. Cover with gauze.   Omnipod 5 G7 Pods (Gen 5) Misc 1 Device by Does not apply route every other day. What changed: when to take this   pantoprazole  40 MG tablet Commonly known as: PROTONIX  Take 1 tablet (40 mg total) by mouth daily.   rosuvastatin  10 MG tablet Commonly known as: CRESTOR  Take 1 tablet (10 mg total) by mouth daily.       Allergies[1]    The results of significant diagnostics from this hospitalization (including imaging, microbiology, ancillary and laboratory) are listed below for reference.    Significant Diagnostic Studies: ECHOCARDIOGRAM COMPLETE Result Date: 12/13/2024    ECHOCARDIOGRAM REPORT   Patient Name:   Duane Hall Date of Exam: 12/13/2024 Medical Rec #:  [id number]     Height:       69.0 in Accession #:    [id number]    Weight:       161.6 lb Date of Birth:  1956-03-06     BSA:          1.887 m Patient Age:    68 years      BP:           130/78 mmHg Patient Gender: M             HR:           86 bpm. Exam Location:  Inpatient Procedure: 2D Echo, Color Doppler and Cardiac Doppler (Both Spectral and Color            Flow Doppler were utilized during procedure). Indications:     NSTEMI  History:         Patient has prior history of Echocardiogram examinations, most                  recent 07/29/2020. Acute MI; Risk Factors:Hypertension and                   Dyslipidemia.  Sonographer:     Duane Hall Referring Phys:  Duane Hall Diagnosing Phys: Duane Hall IMPRESSIONS  1. Left ventricular ejection fraction, by estimation, is 40 to 45%. Left ventricular ejection fraction by 2D MOD biplane is 40.6 %. The left ventricle has mildly decreased function. The left ventricle demonstrates regional wall motion abnormalities (see  scoring diagram/findings for description). Left ventricular diastolic parameters are consistent with Grade  I diastolic dysfunction (impaired relaxation).  2. Right ventricular systolic function is normal. The right ventricular size is normal.  3. The mitral valve is normal in structure. No evidence of mitral valve regurgitation. No evidence of mitral stenosis.  4. The aortic valve is normal in structure. Aortic valve regurgitation is not visualized. No aortic stenosis is present.  5. The inferior vena cava is normal in size with <50% respiratory variability, suggesting right atrial pressure of 8 mmHg. Comparison(s): Changes from prior study are noted. EF is lower and there are new wall motion abnormalities. FINDINGS  Left Ventricle: Left ventricular ejection fraction, by estimation, is 40 to 45%. Left ventricular ejection fraction by 2D MOD biplane is 40.6 %. The left ventricle has mildly decreased function. The left ventricle demonstrates regional wall motion abnormalities. The left ventricular internal cavity size was normal in size. There is no concentric left ventricular hypertrophy. Left ventricular diastolic parameters are consistent with Grade I diastolic dysfunction (impaired relaxation).  LV Wall Scoring: The entire septum and entire inferior wall are hypokinetic. The entire anterior wall and entire lateral wall are normal. Right Ventricle: The right ventricular size is normal. No increase in right ventricular wall thickness. Right ventricular systolic function is normal. Left Atrium: Left atrial size was normal in size.  Right Atrium: Right atrial size was normal in size. Pericardium: There is no evidence of pericardial effusion. Mitral Valve: The mitral valve is normal in structure. No evidence of mitral valve regurgitation. No evidence of mitral valve stenosis. MV peak gradient, 8.2 mmHg. The mean mitral valve gradient is 3.0 mmHg. Tricuspid Valve: The tricuspid valve is normal in structure. Tricuspid valve regurgitation is not demonstrated. No evidence of tricuspid stenosis. Aortic Valve: The aortic valve is normal in structure. Aortic valve regurgitation is not visualized. No aortic stenosis is present. Aortic valve mean gradient measures 3.0 mmHg. Aortic valve peak gradient measures 5.5 mmHg. Aortic valve area, by VTI measures 2.68 cm. Pulmonic Valve: The pulmonic valve was normal in structure. Pulmonic valve regurgitation is not visualized. No evidence of pulmonic stenosis. Aorta: The aortic root and ascending aorta are structurally normal, with no evidence of dilitation. Venous: The inferior vena cava is normal in size with less than 50% respiratory variability, suggesting right atrial pressure of 8 mmHg. IAS/Shunts: No atrial level shunt detected by color flow Doppler.  LEFT VENTRICLE PLAX 2D                        Biplane EF (MOD) LVIDd:         5.60 cm         LV Biplane EF:   Left LVIDs:         4.20 cm                          ventricular LV PW:         1.10 cm                          ejection LV IVS:        1.20 cm                          fraction by LVOT diam:     2.00 cm  2D MOD LV SV:         66                               biplane is LV SV Index:   35                               40.6 %. LVOT Area:     3.14 cm                                Diastology                                LV e' medial:    5.44 cm/s LV Volumes (MOD)               LV E/e' medial:  23.2 LV vol d, MOD    144.1 ml      LV e' lateral:   6.42 cm/s A2C:                           LV E/e' lateral: 19.6 LV vol d, MOD     141.2 ml A4C: LV vol s, MOD    92.6 ml A2C: LV vol s, MOD    83.8 ml A4C: LV SV MOD A2C:   51.4 ml LV SV MOD A4C:   141.2 ml LV SV MOD BP:    60.5 ml RIGHT VENTRICLE             IVC RV Basal diam:  3.70 cm     IVC diam: 1.80 cm RV Mid diam:    2.80 cm RV S prime:     11.60 cm/s  PULMONARY VEINS TAPSE (M-mode): 1.9 cm      Diastolic Velocity: 39.50 cm/s                             S/D Velocity:       1.50                             Systolic Velocity:  59.90 cm/s LEFT ATRIUM             Index        RIGHT ATRIUM           Index LA diam:        3.90 cm 2.07 cm/m   RA Area:     15.40 cm LA Vol (A2C):   47.1 ml 24.96 ml/m  RA Volume:   40.20 ml  21.30 ml/m LA Vol (A4C):   42.2 ml 22.36 ml/m LA Biplane Vol: 47.3 ml 25.07 ml/m  AORTIC VALVE AV Area (Vmax):    2.69 cm AV Area (Vmean):   2.67 cm AV Area (VTI):     2.68 cm AV Vmax:           117.00 cm/s AV Vmean:          80.750 cm/s AV VTI:            0.246 m AV Peak Grad:      5.5 mmHg AV Mean Grad:  3.0 mmHg LVOT Vmax:         100.20 cm/s LVOT Vmean:        68.650 cm/s LVOT VTI:          0.211 m LVOT/AV VTI ratio: 0.85  AORTA Ao Root diam: 2.90 cm Ao Asc diam:  2.80 cm MITRAL VALVE                TRICUSPID VALVE MV Area (PHT): 5.54 cm     TR Peak grad:   13.7 mmHg MV Area VTI:   2.06 cm     TR Vmax:        185.00 cm/s MV Peak grad:  8.2 mmHg MV Mean grad:  3.0 mmHg     SHUNTS MV Vmax:       1.43 m/s     Systemic VTI:  0.21 m MV Vmean:      78.6 cm/s    Systemic Diam: 2.00 cm MV Decel Time: 137 msec MV E velocity: 126.00 cm/s MV A velocity: 106.00 cm/s MV E/A ratio:  1.19 Franck Azobou Hall Electronically signed by Duane Hall Signature Date/Time: 12/13/2024/12:14:38 PM    Final (Updated)    MR BRAIN WO CONTRAST Result Date: 12/12/2024 EXAM: MRI BRAIN WITHOUT CONTRAST 12/12/2024 01:45:26 PM TECHNIQUE: Multiplanar multisequence MRI of the head/brain was performed without the administration of intravenous contrast. The patient discontinued the  examination prior to completion. Axial and coronal DWI, sagittal T1, axial T2, and axial FLAIR sequences were obtained and are motion degraded, including severe motion on the FLAIR sequence despite repeat imaging. COMPARISON: Head CT 12/11/2024. CLINICAL HISTORY: Mental status change, unknown cause. FINDINGS: BRAIN AND VENTRICLES: Diffusion weighted imaging is of acceptable quality, and no acute infarct is evident. No significant intracranial mass effect/midline shift, hydrocephalus, or extra-axial fluid collection is identified. Patchy T2 hyperintensities in the cerebral white matter bilaterally are advanced for age and nonspecific but compatible with chronic small vessel ischemic disease. There are chronic lacunar infarcts in the deep cerebral white matter, deep gray nuclei, and cerebellum. There is mild cerebral atrophy. Major intracranial vascular flow voids are preserved. ORBITS: Bilateral cataract extraction. SINUSES AND MASTOIDS: Small fluid level in the right sphenoid sinus. Trace right mastoid fluid. BONES AND SOFT TISSUES: Normal marrow signal. No acute soft tissue abnormality. IMPRESSION: 1. Motion degraded, incomplete examination. 2. No acute infarct. 3. Age-advanced chronic small vessel ischemic disease. Electronically signed by: Dasie Hamburg MD 12/12/2024 02:41 PM EST RP Workstation: [id number]   CT ABDOMEN PELVIS W CONTRAST Result Date: 12/11/2024 EXAM: CT ABDOMEN AND PELVIS WITH CONTRAST 12/11/2024 07:35:43 PM TECHNIQUE: CT of the abdomen and pelvis was performed with the administration of 75 mL of intravenous iohexol  (OMNIPAQUE ) 350 MG/ML injection. Multiplanar reformatted images are provided for review. Automated exposure control, iterative reconstruction, and/or weight-based adjustment of the mA/kV was utilized to reduce the radiation dose to as low as reasonably achievable. COMPARISON: 10/22/2018 CLINICAL HISTORY: ams FINDINGS: LOWER CHEST: No acute abnormality. LIVER: The liver is  unremarkable. GALLBLADDER AND BILE DUCTS: Cholelithiasis, without associated inflammatory changes. No biliary ductal dilatation. SPLEEN: No acute abnormality. PANCREAS: 14 mm unilocular cyst in the pancreatic uncinate process (MS 35), likely a pseudocyst or side branch IPNM. ADRENAL GLANDS: No acute abnormality. KIDNEYS, URETERS AND BLADDER: Bilateral renal cysts, measuring up to 2.3 cm in the left upper kidney, benign (Bosniak 1). No follow-up is recommended. Per consensus, no follow-up is needed for simple Bosniak type 1 and 2 renal cysts, unless the patient has a  malignancy history or risk factors. 14 mm hyperdense lesion in the left upper kidney (image 29), likely reflecting a benign hemorrhagic cyst, but indeterminate. No stones in the kidneys or ureters. No hydronephrosis. No perinephric or periureteral stranding. Mildly thick-walled bladder, likely reflecting chronic bladder obstruction. GI AND BOWEL: Stomach demonstrates no acute abnormality. There is no bowel obstruction. Normal appendix (image 57). PERITONEUM AND RETROPERITONEUM: No ascites. No free air. VASCULATURE: Aorta is normal in caliber. Atherosclerotic calcifications of the abdominal aorta and branch vessels. LYMPH NODES: No lymphadenopathy. REPRODUCTIVE ORGANS: Prostatomegaly, suggesting BPH. BONES AND SOFT TISSUES: Mild degenerative changes of the upper lumbar spine. No acute osseous abnormality. No focal soft tissue abnormality. IMPRESSION: 1. No acute findings in the abdomen or pelvis. 2. Incidental 14 mm pancreatic uncinate process cyst, likely a pseudocyst or benign side branch IPMN. Recommend MRI or pancreas-protocol CT in 1 year, then every 2 years for a total of 5 years if stable, per ACR incidental pancreatic cyst guidance for age 85-79. 3. Indeterminate 14 mm hyperdense left upper renal lesion, likely reflecting a benign hemorrhagic cyst. Attention on follow-up is suggested. Electronically signed by: Pinkie Pebbles MD 12/11/2024 07:48  PM EST RP Workstation: [id number]   CT Angio Chest PE W and/or Wo Contrast Result Date: 12/11/2024 EXAM: CTA of the Chest with contrast for PE 12/11/2024 07:35:43 PM TECHNIQUE: CTA of the chest was performed after the administration of intravenous contrast. Multiplanar reformatted images are provided for review. MIP images are provided for review. Automated exposure control, iterative reconstruction, and/or weight based adjustment of the mA/kV was utilized to reduce the radiation dose to as low as reasonably achievable. COMPARISON: 02/24/2020 CLINICAL HISTORY: Pulmonary embolism (PE) suspected, high prob. Unresponsive. Vomiting. FINDINGS: PULMONARY ARTERIES: Pulmonary arteries are adequately opacified for evaluation. No pulmonary embolism. Main pulmonary artery is normal in caliber. MEDIASTINUM: The heart demonstrates trace inferior pericardial effusion. There is no acute abnormality of the thoracic aorta. Thoracic aortic atherosclerosis. LYMPH NODES: Small mediastinal nodes, including an 18 mm short axis subcarinal node, progressive from remote prior but possibly reactive. No hilar or axillary lymphadenopathy. LUNGS AND PLEURA: Interlobular septal thickening with scattered ground-glass opacity, suggesting mild interstitial edema. Peribronchovascular nodularity, most prominent in the left lower lobe, suggesting superimposed multifocal pneumonia. Mild centrilobular emphysematous changes. Small bilateral pleural effusions with associated compressive atelectasis in the bilateral lower lobes. No pneumothorax. UPPER ABDOMEN: Limited images of the upper abdomen are unremarkable. SOFT TISSUES AND BONES: No acute bone or soft tissue abnormality. IMPRESSION: 1. No evidence of pulmonary embolism. 2. Multifocal pneumonia, left lower lobe predominant. 3. Mild interstitial edema with small bilateral pleural effusions. 4. Enlarged subcarinal lymph node measuring 18 mm short axis, increased from remote prior; consider  short-interval chest CT follow-up to ensure resolution after treatment of pneumonia. Electronically signed by: Pinkie Pebbles MD 12/11/2024 07:42 PM EST RP Workstation: [id number]   DG Chest Port 1 View Result Date: 12/11/2024 CLINICAL DATA:  Questionable sepsis - evaluate for abnormality EXAM: PORTABLE CHEST 1 VIEW COMPARISON:  March 07, 2021 FINDINGS: The cardiomediastinal silhouette is unchanged in contour. Small LEFT pleural effusion. No pneumothorax. Diffuse interstitial markings with increased peribronchial cuffing. Atherosclerotic calcifications. IMPRESSION: Constellation of findings are favored to reflect pulmonary edema with small LEFT pleural effusion. Differential considerations include atypical infection. Electronically Signed   By: Corean Salter M.D.   On: 12/11/2024 15:44   CT Head Wo Contrast Result Date: 12/11/2024 CLINICAL DATA:  Altered mental status. EXAM: CT HEAD WITHOUT CONTRAST TECHNIQUE: Contiguous axial images were obtained  from the base of the skull through the vertex without intravenous contrast. RADIATION DOSE REDUCTION: This exam was performed according to the departmental dose-optimization program which includes automated exposure control, adjustment of the mA and/or kV according to patient size and/or use of iterative reconstruction technique. COMPARISON:  None Available. FINDINGS: Brain: There is generalized cerebral atrophy with widening of the extra-axial spaces and ventricular dilatation. There are areas of decreased attenuation within the white matter tracts of the supratentorial brain, consistent with microvascular disease changes. Small, chronic bilateral thalamic lacunar infarcts are noted. A chronic infarct involving the body of the right caudate is also seen. Vascular: There is moderate to marked severity bilateral cavernous carotid artery calcification. Skull: Normal. Negative for fracture or focal lesion. Sinuses/Orbits: No acute finding. Other: None.  IMPRESSION: 1. Generalized cerebral atrophy with chronic white matter small vessel ischemic changes. 2. Chronic bilateral thalamic lacunar infarcts. 3. Chronic infarct involving the body of the right caudate. 4. No acute intracranial abnormality. Electronically Signed   By: Suzen Dials M.D.   On: 12/11/2024 15:24    Microbiology: Recent Results (from the past 240 hours)  Resp panel by RT-PCR (RSV, Flu A&B, Covid) Anterior Nasal Swab     Status: None   Collection Time: 12/11/24  2:48 PM   Specimen: Anterior Nasal Swab  Result Value Ref Range Status   SARS Coronavirus 2 by RT PCR NEGATIVE NEGATIVE Final   Influenza A by PCR NEGATIVE NEGATIVE Final   Influenza B by PCR NEGATIVE NEGATIVE Final    Comment: (NOTE) The Xpert Xpress SARS-CoV-2/FLU/RSV plus assay is intended as an aid in the diagnosis of influenza from Nasopharyngeal swab specimens and should not be used as a sole basis for treatment. Nasal washings and aspirates are unacceptable for Xpert Xpress SARS-CoV-2/FLU/RSV testing.  Fact Sheet for Patients: bloggercourse.com  Fact Sheet for Healthcare Providers: seriousbroker.it  This test is not yet approved or cleared by the [place]  FDA and has been authorized for detection and/or diagnosis of SARS-CoV-2 by FDA under an Emergency Use Authorization (EUA). This EUA will remain in effect (meaning this test can be used) for the duration of the COVID-19 declaration under Section 564(b)(1) of the Act, 21 U.S.C. section 360bbb-3(b)(1), unless the authorization is terminated or revoked.     Resp Syncytial Virus by PCR NEGATIVE NEGATIVE Final    Comment: (NOTE) Fact Sheet for Patients: bloggercourse.com  Fact Sheet for Healthcare Providers: seriousbroker.it  This test is not yet approved or cleared by the [place]  FDA and has been authorized for detection and/or  diagnosis of SARS-CoV-2 by FDA under an Emergency Use Authorization (EUA). This EUA will remain in effect (meaning this test can be used) for the duration of the COVID-19 declaration under Section 564(b)(1) of the Act, 21 U.S.C. section 360bbb-3(b)(1), unless the authorization is terminated or revoked.  Performed at [facility], [address] [place], [place] [address]   Blood Culture (routine x 2)     Status: None   Collection Time: 12/11/24  4:02 PM   Specimen: BLOOD LEFT HAND  Result Value Ref Range Status   Specimen Description BLOOD LEFT HAND  Final   Special Requests   Final    BOTTLES DRAWN AEROBIC AND ANAEROBIC Blood Culture results may not be optimal due to an inadequate volume of blood received in culture bottles   Culture   Final    NO GROWTH 5 DAYS Performed at [facility], [address] [place],   [address]    Report Status 12/16/2024 FINAL  Final  MRSA Next Gen by PCR, Nasal     Status: None   Collection Time: 12/12/24  2:44 AM   Specimen: Nasal Mucosa; Nasal Swab  Result Value Ref Range Status   MRSA by PCR Next Gen NOT DETECTED NOT DETECTED Final    Comment: (NOTE) The GeneXpert MRSA Assay (FDA approved for NASAL specimens only), is one component of a comprehensive MRSA colonization surveillance program. It is not intended to diagnose MRSA infection nor to guide or monitor treatment for MRSA infections. Test performance is not FDA approved in patients less than 86 years old. Performed at [facility], [address], [place], [place] [address]   Blood Culture (routine x 2)     Status: None   Collection Time: 12/12/24  3:01 AM   Specimen: BLOOD LEFT ARM  Result Value Ref Range Status   Specimen Description BLOOD LEFT ARM  Final   Special Requests   Final    BOTTLES DRAWN AEROBIC AND ANAEROBIC Blood Culture adequate volume   Culture   Final    NO GROWTH 5 DAYS Performed at [facility], [address] [place], [place]  [address]    Report Status 12/17/2024 FINAL  Final     Labs: Basic Metabolic Panel: No results for input(s): NA, K, CL, CO2, GLUCOSE, BUN, CREATININE, CALCIUM , MG, PHOS in the last 168 hours. Liver Function Tests: No results for input(s): AST, ALT, ALKPHOS, BILITOT, PROT, ALBUMIN  in the last 168 hours. No results for input(s): LIPASE, AMYLASE in the last 168 hours. No results for input(s): AMMONIA in the last 168 hours. CBC: No results for input(s): WBC, NEUTROABS, HGB, HCT, MCV, PLT in the last 168 hours. Cardiac Enzymes: No results for input(s): CKTOTAL, CKMB, CKMBINDEX, TROPONINI in the last 168 hours. BNP: BNP (last 3 results) No results for input(s): BNP in the last 8760 hours.  ProBNP (last 3 results) No results for input(s): PROBNP in the last 8760 hours.  CBG: Recent Labs  Lab 12/14/24 1107 12/14/24 2020 12/15/24 1938  GLUCAP 145* 158* 141*    Signed:  Colen Grimes MD   [facility] 12/21/2024, 8:44 AM      [1]  Allergies Allergen Reactions   Amoxicillin -Pot Clavulanate Diarrhea   Midodrine  Other (See Comments)    Other reaction(s): Urinary Sensation   Tape Other (See Comments)    Latex band aids cause blistering

## 2024-12-21 NOTE — TOC Transition Note (Signed)
 Transition of Care [facility]) - Discharge Note   Patient Details  Name: Duane Hall MRN: [id number] Date of Birth: 1956/11/21  Transition of Care [facility]) CM/SW Contact:  Tom-Johnson, Harvest Muskrat, RN Phone Number: 12/21/2024, 9:30 AM   Clinical Narrative:     Patient is scheduled for discharge home with [facility] today.  Readmission Risk Assessment done. Outpatient f/u, hospital f/u and discharge instructions on AVS. Wife, [name] and Inocente with [facility] notified of discharge.  PTAR scheduled to pickup at 2 pm to transport at discharge.  No further ICM needs noted.     Final next level of care: Home w Hospice Care Barriers to Discharge: Barriers Resolved   Patient Goals and CMS Choice Patient states their goals for this hospitalization and ongoing recovery are:: To return home with [facility] CMS Medicare.gov Compare Post Acute Care list provided to:: Patient Choice offered to / list presented to : Patient, Spouse      Discharge Placement                Patient to be transferred to facility by: PTAR Name of family member notified: Wife, [facility]    Discharge Plan and Services Additional resources added to the After Visit Summary for                  DME Arranged: N/A DME Agency: NA       HH Arranged: NA HH Agency: NA        Social Drivers of Health (SDOH) Interventions SDOH Screenings   Food Insecurity: Low Risk (08/10/2024)   Received from [facility]  Housing: Low Risk (08/10/2024)   Received from [facility]  Transportation Needs: No Transportation Needs (08/10/2024)   Received from [facility]  Utilities: Low Risk (08/10/2024)   Received from [facility]  Depression (PHQ2-9): Low Risk (04/04/2023)  Financial Resource Strain: Unknown (02/20/2022)   Received from [facility]  Tobacco Use: High Risk (12/19/2024)     Readmission Risk Interventions    12/21/2024    9:29 AM  Readmission Risk Prevention Plan   Post Dischage Appt Complete  Medication Screening Complete  Transportation Screening Complete

## 2024-12-21 NOTE — Plan of Care (Signed)
" °  Problem: Health Behavior/Discharge Planning: Goal: Ability to identify and utilize available resources and services will improve Outcome: Progressing   Problem: Metabolic: Goal: Ability to maintain appropriate glucose levels will improve Outcome: Progressing   Problem: Skin Integrity: Goal: Risk for impaired skin integrity will decrease Outcome: Progressing   Problem: Tissue Perfusion: Goal: Adequacy of tissue perfusion will improve Outcome: Progressing   Problem: Education: Goal: Knowledge of General Education information will improve Description: Including pain rating scale, medication(s)/side effects and non-pharmacologic comfort measures Outcome: Progressing   Problem: Health Behavior/Discharge Planning: Goal: Ability to manage health-related needs will improve Outcome: Not Progressing   Problem: Elimination: Goal: Will not experience complications related to urinary retention Outcome: Progressing   Problem: Pain Managment: Goal: General experience of comfort will improve and/or be controlled Outcome: Progressing   "

## 2024-12-21 NOTE — Progress Notes (Signed)
 DISCHARGE NOTE HOME Duane Hall to be discharged Home per MD order and will be under Hospice Care.  Discussed prescriptions and follow up appointments with the wife and hospice will be at house at 1600 to discuss medications. Patient verbalized understanding.  Skin clean, dry and intact. Foam to L heel. Bruising BUE. IV catheter discontinued intact. Site without signs and symptoms of complications. Dressing and pressure applied. Pt denies pain at the site currently. No complaints noted.  Patient free of lines, drains, and wounds.   An After Visit Summary (AVS) was printed and given to the patient. Patient escorted via stretcher/PTAR.   [name] A Proctor-Gann, RN

## 2025-01-30 DEATH — deceased

## 2025-04-12 ENCOUNTER — Ambulatory Visit: Admitting: Neurology

## 2025-05-10 ENCOUNTER — Ambulatory Visit: Admitting: Internal Medicine
